# Patient Record
Sex: Female | Born: 1945 | Race: Black or African American | Hispanic: No | Marital: Single | Smoking: Former smoker
Health system: Southern US, Community
[De-identification: ages and names within clinical notes are randomized; demographics above are authoritative.]

## PROBLEM LIST (undated history)

## (undated) DIAGNOSIS — I2510 Atherosclerotic heart disease of native coronary artery without angina pectoris: Secondary | ICD-10-CM

## (undated) DIAGNOSIS — N183 Chronic kidney disease, stage 3 unspecified: Secondary | ICD-10-CM

## (undated) DIAGNOSIS — Z9981 Dependence on supplemental oxygen: Secondary | ICD-10-CM

## (undated) DIAGNOSIS — I739 Peripheral vascular disease, unspecified: Secondary | ICD-10-CM

## (undated) DIAGNOSIS — K219 Gastro-esophageal reflux disease without esophagitis: Secondary | ICD-10-CM

## (undated) DIAGNOSIS — Z9889 Other specified postprocedural states: Secondary | ICD-10-CM

## (undated) DIAGNOSIS — F32A Depression, unspecified: Secondary | ICD-10-CM

## (undated) DIAGNOSIS — M1990 Unspecified osteoarthritis, unspecified site: Secondary | ICD-10-CM

## (undated) DIAGNOSIS — F329 Major depressive disorder, single episode, unspecified: Secondary | ICD-10-CM

## (undated) DIAGNOSIS — K625 Hemorrhage of anus and rectum: Secondary | ICD-10-CM

## (undated) DIAGNOSIS — G8929 Other chronic pain: Secondary | ICD-10-CM

## (undated) DIAGNOSIS — I779 Disorder of arteries and arterioles, unspecified: Secondary | ICD-10-CM

## (undated) DIAGNOSIS — I10 Essential (primary) hypertension: Secondary | ICD-10-CM

## (undated) DIAGNOSIS — I5042 Chronic combined systolic (congestive) and diastolic (congestive) heart failure: Secondary | ICD-10-CM

## (undated) DIAGNOSIS — Z8719 Personal history of other diseases of the digestive system: Secondary | ICD-10-CM

## (undated) DIAGNOSIS — E039 Hypothyroidism, unspecified: Secondary | ICD-10-CM

## (undated) DIAGNOSIS — M545 Low back pain, unspecified: Secondary | ICD-10-CM

## (undated) DIAGNOSIS — E119 Type 2 diabetes mellitus without complications: Secondary | ICD-10-CM

## (undated) DIAGNOSIS — M5136 Other intervertebral disc degeneration, lumbar region: Secondary | ICD-10-CM

## (undated) DIAGNOSIS — I219 Acute myocardial infarction, unspecified: Secondary | ICD-10-CM

## (undated) DIAGNOSIS — J449 Chronic obstructive pulmonary disease, unspecified: Secondary | ICD-10-CM

## (undated) DIAGNOSIS — D649 Anemia, unspecified: Secondary | ICD-10-CM

## (undated) DIAGNOSIS — Z9289 Personal history of other medical treatment: Secondary | ICD-10-CM

## (undated) DIAGNOSIS — I639 Cerebral infarction, unspecified: Secondary | ICD-10-CM

## (undated) DIAGNOSIS — E785 Hyperlipidemia, unspecified: Secondary | ICD-10-CM

## (undated) DIAGNOSIS — M51369 Other intervertebral disc degeneration, lumbar region without mention of lumbar back pain or lower extremity pain: Secondary | ICD-10-CM

## (undated) HISTORY — DX: Gastro-esophageal reflux disease without esophagitis: K21.9

## (undated) HISTORY — PX: TUBAL LIGATION: SHX77

## (undated) HISTORY — PX: CORONARY ARTERY BYPASS GRAFT: SHX141

## (undated) HISTORY — DX: Other intervertebral disc degeneration, lumbar region without mention of lumbar back pain or lower extremity pain: M51.369

## (undated) HISTORY — DX: Essential (primary) hypertension: I10

## (undated) HISTORY — DX: Anemia, unspecified: D64.9

## (undated) HISTORY — DX: Chronic kidney disease, stage 3 unspecified: N18.30

## (undated) HISTORY — PX: PR VEIN BYPASS GRAFT,AORTO-FEM-POP: 35551

## (undated) HISTORY — PX: CAROTID ENDARTERECTOMY: SUR193

## (undated) HISTORY — PX: DILATION AND CURETTAGE OF UTERUS: SHX78

## (undated) HISTORY — DX: Other specified postprocedural states: Z98.890

## (undated) HISTORY — PX: CARPAL TUNNEL RELEASE: SHX101

## (undated) HISTORY — PX: THYROIDECTOMY: SHX17

## (undated) HISTORY — DX: Disorder of arteries and arterioles, unspecified: I77.9

## (undated) HISTORY — DX: Chronic obstructive pulmonary disease, unspecified: J44.9

## (undated) HISTORY — PX: PTCA: SHX146

## (undated) HISTORY — DX: Other intervertebral disc degeneration, lumbar region: M51.36

## (undated) HISTORY — DX: Chronic kidney disease, stage 3 (moderate): N18.3

## (undated) HISTORY — PX: CATARACT EXTRACTION W/ INTRAOCULAR LENS IMPLANT & ANTERIOR VITRECTOMY, BILATERAL: SHX1310

## (undated) HISTORY — DX: Hyperlipidemia, unspecified: E78.5

## (undated) HISTORY — DX: Personal history of other diseases of the digestive system: Z87.19

## (undated) HISTORY — PX: ANGIOPLASTY: SHX39

## (undated) HISTORY — DX: Peripheral vascular disease, unspecified: I73.9

## (undated) HISTORY — PX: CHOLECYSTECTOMY OPEN: SUR202

## (undated) HISTORY — DX: Chronic combined systolic (congestive) and diastolic (congestive) heart failure: I50.42

---

## 1995-10-25 DIAGNOSIS — I219 Acute myocardial infarction, unspecified: Secondary | ICD-10-CM

## 1995-10-25 HISTORY — DX: Acute myocardial infarction, unspecified: I21.9

## 2011-01-24 DIAGNOSIS — Z8673 Personal history of transient ischemic attack (TIA), and cerebral infarction without residual deficits: Secondary | ICD-10-CM | POA: Insufficient documentation

## 2011-11-25 ENCOUNTER — Inpatient Hospital Stay (HOSPITAL_COMMUNITY)
Admission: EM | Admit: 2011-11-25 | Discharge: 2011-11-28 | DRG: 065 | Disposition: A | Discharge status: Home / Self Care | Payer: Medicare Other | Attending: Internal Medicine | Admitting: Internal Medicine

## 2011-11-25 ENCOUNTER — Encounter (HOSPITAL_COMMUNITY): Payer: Self-pay | Admitting: Emergency Medicine

## 2011-11-25 DIAGNOSIS — I5022 Chronic systolic (congestive) heart failure: Secondary | ICD-10-CM | POA: Diagnosis present

## 2011-11-25 DIAGNOSIS — E785 Hyperlipidemia, unspecified: Secondary | ICD-10-CM

## 2011-11-25 DIAGNOSIS — E039 Hypothyroidism, unspecified: Secondary | ICD-10-CM | POA: Diagnosis present

## 2011-11-25 DIAGNOSIS — Z7902 Long term (current) use of antithrombotics/antiplatelets: Secondary | ICD-10-CM

## 2011-11-25 DIAGNOSIS — Z794 Long term (current) use of insulin: Secondary | ICD-10-CM

## 2011-11-25 DIAGNOSIS — I2510 Atherosclerotic heart disease of native coronary artery without angina pectoris: Secondary | ICD-10-CM

## 2011-11-25 DIAGNOSIS — E119 Type 2 diabetes mellitus without complications: Secondary | ICD-10-CM | POA: Diagnosis present

## 2011-11-25 DIAGNOSIS — Z23 Encounter for immunization: Secondary | ICD-10-CM

## 2011-11-25 DIAGNOSIS — I10 Essential (primary) hypertension: Secondary | ICD-10-CM

## 2011-11-25 DIAGNOSIS — Z6827 Body mass index (BMI) 27.0-27.9, adult: Secondary | ICD-10-CM

## 2011-11-25 DIAGNOSIS — I252 Old myocardial infarction: Secondary | ICD-10-CM

## 2011-11-25 DIAGNOSIS — J449 Chronic obstructive pulmonary disease, unspecified: Secondary | ICD-10-CM

## 2011-11-25 DIAGNOSIS — I509 Heart failure, unspecified: Secondary | ICD-10-CM

## 2011-11-25 DIAGNOSIS — Z9861 Coronary angioplasty status: Secondary | ICD-10-CM

## 2011-11-25 DIAGNOSIS — I6350 Cerebral infarction due to unspecified occlusion or stenosis of unspecified cerebral artery: Principal | ICD-10-CM | POA: Diagnosis present

## 2011-11-25 DIAGNOSIS — Z91199 Patient's noncompliance with other medical treatment and regimen due to unspecified reason: Secondary | ICD-10-CM

## 2011-11-25 DIAGNOSIS — Z8673 Personal history of transient ischemic attack (TIA), and cerebral infarction without residual deficits: Secondary | ICD-10-CM | POA: Diagnosis present

## 2011-11-25 DIAGNOSIS — I639 Cerebral infarction, unspecified: Secondary | ICD-10-CM

## 2011-11-25 DIAGNOSIS — I5042 Chronic combined systolic (congestive) and diastolic (congestive) heart failure: Secondary | ICD-10-CM | POA: Diagnosis present

## 2011-11-25 DIAGNOSIS — E890 Postprocedural hypothyroidism: Secondary | ICD-10-CM | POA: Diagnosis present

## 2011-11-25 DIAGNOSIS — Z951 Presence of aortocoronary bypass graft: Secondary | ICD-10-CM

## 2011-11-25 DIAGNOSIS — R279 Unspecified lack of coordination: Secondary | ICD-10-CM | POA: Diagnosis present

## 2011-11-25 DIAGNOSIS — I658 Occlusion and stenosis of other precerebral arteries: Secondary | ICD-10-CM | POA: Diagnosis present

## 2011-11-25 DIAGNOSIS — I739 Peripheral vascular disease, unspecified: Secondary | ICD-10-CM

## 2011-11-25 DIAGNOSIS — Z87891 Personal history of nicotine dependence: Secondary | ICD-10-CM

## 2011-11-25 DIAGNOSIS — I6529 Occlusion and stenosis of unspecified carotid artery: Secondary | ICD-10-CM | POA: Diagnosis present

## 2011-11-25 DIAGNOSIS — Z7982 Long term (current) use of aspirin: Secondary | ICD-10-CM

## 2011-11-25 DIAGNOSIS — R630 Anorexia: Secondary | ICD-10-CM | POA: Diagnosis present

## 2011-11-25 DIAGNOSIS — IMO0002 Reserved for concepts with insufficient information to code with codable children: Secondary | ICD-10-CM | POA: Diagnosis present

## 2011-11-25 DIAGNOSIS — I219 Acute myocardial infarction, unspecified: Secondary | ICD-10-CM

## 2011-11-25 DIAGNOSIS — Z9119 Patient's noncompliance with other medical treatment and regimen: Secondary | ICD-10-CM

## 2011-11-25 DIAGNOSIS — Z888 Allergy status to other drugs, medicaments and biological substances status: Secondary | ICD-10-CM

## 2011-11-25 DIAGNOSIS — E1165 Type 2 diabetes mellitus with hyperglycemia: Secondary | ICD-10-CM | POA: Diagnosis present

## 2011-11-25 HISTORY — DX: Acute myocardial infarction, unspecified: I21.9

## 2011-11-25 HISTORY — DX: Peripheral vascular disease, unspecified: I73.9

## 2011-11-25 HISTORY — DX: Atherosclerotic heart disease of native coronary artery without angina pectoris: I25.10

## 2011-11-25 LAB — CBC
Hemoglobin: 11.7 g/dL — ABNORMAL LOW (ref 12.0–15.0)
MCV: 89.4 fL (ref 78.0–100.0)
Platelets: 194 10*3/uL (ref 150–400)
RBC: 3.85 MIL/uL — ABNORMAL LOW (ref 3.87–5.11)
WBC: 6.5 10*3/uL (ref 4.0–10.5)

## 2011-11-25 LAB — DIFFERENTIAL
Eosinophils Relative: 1 % (ref 0–5)
Lymphocytes Relative: 17 % (ref 12–46)
Lymphs Abs: 1.1 10*3/uL (ref 0.7–4.0)

## 2011-11-25 LAB — BASIC METABOLIC PANEL
CO2: 30 mEq/L (ref 19–32)
Glucose, Bld: 507 mg/dL — ABNORMAL HIGH (ref 70–99)
Potassium: 4.4 mEq/L (ref 3.5–5.1)
Sodium: 132 mEq/L — ABNORMAL LOW (ref 135–145)

## 2011-11-25 LAB — URINALYSIS, ROUTINE W REFLEX MICROSCOPIC
Glucose, UA: >1000 mg/dL — AB
Hgb urine dipstick: NEGATIVE
Specific Gravity, Urine: <1.005 — ABNORMAL LOW (ref 1.005–1.030)

## 2011-11-25 LAB — URINE MICROSCOPIC-ADD ON

## 2011-11-25 MED ORDER — SODIUM CHLORIDE 0.9 % IV BOLUS (SEPSIS)
1000.0000 mL | Freq: Once | INTRAVENOUS | Status: AC
  Administered 2011-11-26: 1000 mL via INTRAVENOUS

## 2011-11-25 NOTE — ED Notes (Signed)
PT. WAITING FOR MRI -BRAIN.

## 2011-11-25 NOTE — ED Provider Notes (Signed)
History     CSN: [id number]  Arrival date & time 11/25/11  1943   First MD Initiated Contact with Patient 11/25/11 2134      Chief Complaint  Patient presents with  . Dizziness    (Consider location/radiation/quality/duration/timing/severity/associated sxs/prior treatment) HPI History provided by pt.   Pt has had room-spinning dizziness upon standing and with walking for the past 3 days.  Does not experience dizziness when she is at rest or when she rotates head.  Associated w/ ataxia, loss of depth perception and visual field changes that she describes as seeing a spinning fan in her left lateral visual field.  This also occurs only with walking.  Denies blurred vision, dysarthria, dysphagia, confusion, extremity weakness/paresthesias and nausea/vomiting.  Has not had a head injury.  Has h/o TIA.  Is anti-coagulated w/ plavix.    Past Medical History  Diagnosis Date  . Diabetes mellitus   . MI (myocardial infarction)   . CAD (coronary artery disease)   . Neuromyopathy     Past Surgical History  Procedure Date  . Cardiac surgery   . Thyroid surgery     No family history on file.  History  Substance Use Topics  . Smoking status: Former Games developer  . Smokeless tobacco: Not on file  . Alcohol Use: No    OB History    Grav Para Term Preterm Abortions TAB SAB Ect Mult Living                  Review of Systems  All other systems reviewed and are negative.    Allergies  Lisinopril and Vicodin  Home Medications   Current Outpatient Rx  Name Route Sig Dispense Refill  . ALBUTEROL SULFATE HFA 108 (90 BASE) MCG/ACT IN AERS Inhalation Inhale 2 puffs into the lungs every 6 (six) hours as needed. For shortness of breath    . ALBUTEROL SULFATE (5 MG/ML) 0.5% IN NEBU Nebulization Take 2.5 mg by nebulization every 6 (six) hours as needed. For shortness of breath    . CARVEDILOL 25 MG PO TABS Oral Take 25 mg by mouth 2 (two) times daily with a meal.    . CLOPIDOGREL BISULFATE  75 MG PO TABS Oral Take 75 mg by mouth daily.    . FUROSEMIDE 20 MG PO TABS Oral Take 20 mg by mouth 2 (two) times daily.    . ISOSORBIDE MONONITRATE ER 60 MG PO TB24 Oral Take 30 mg by mouth daily.    . TRAMADOL HCL 50 MG PO TABS Oral Take 50 mg by mouth every 6 (six) hours as needed. For pain      BP 158/71  Pulse 80  Temp(Src) 98.1 F (36.7 C) (Oral)  Resp 18  SpO2 100%  Physical Exam  Nursing note and vitals reviewed. Constitutional: She is oriented to person, place, and time. She appears well-developed and well-nourished. No distress.  HENT:  Head: Normocephalic and atraumatic.  Eyes:       Normal appearance  Neck: Normal range of motion.  Cardiovascular: Normal rate, regular rhythm and intact distal pulses.   Pulmonary/Chest: Effort normal and breath sounds normal.  Musculoskeletal: Normal range of motion.  Neurological: She is alert and oriented to person, place, and time. She has normal reflexes. She displays no tremor. No cranial nerve deficit or sensory deficit. Coordination and gait normal.       5/5 and equal upper and lower extremity strength.  No past pointing.  No pronator drift.  No  nystagmus. Positive romberg sign.  Normal gait but pt drifts to the right as she walks.  Pt reaches for my hand but falls short.    Skin: Skin is warm and dry. No rash noted.  Psychiatric: She has a normal mood and affect. Her behavior is normal.    ED Course  Procedures (including critical care time)  Labs Reviewed  CBC - Abnormal; Notable for the following:    RBC 3.85 (*)    Hemoglobin 11.7 (*)    HCT 34.4 (*)    All other components within normal limits  BASIC METABOLIC PANEL - Abnormal; Notable for the following:    Sodium 132 (*)    Chloride 95 (*)    Glucose, Bld 507 (*)    GFR calc non Af Amer 57 (*)    GFR calc Af Amer 66 (*)    All other components within normal limits  URINALYSIS, ROUTINE W REFLEX MICROSCOPIC - Abnormal; Notable for the following:    Specific  Gravity, Urine <1.005 (*)    Glucose, UA >1000 (*)    All other components within normal limits  GLUCOSE, CAPILLARY - Abnormal; Notable for the following:    Glucose-Capillary 291 (*)    All other components within normal limits  DIFFERENTIAL  URINE MICROSCOPIC-ADD ON   No results found.   No diagnosis found.    MDM  Pt w/ h/o TIA, anti-coagulated w/ plavix, presents w/ c/o dizziness x 3d + changes in peripheral vision and depth perception.  Exam sig for pos romberg sign and ataxia.  Labs sig for hyperglycemia.  Sx may be secondary to hyperglycemia but MRI pending to r/o posterior circulation stroke.  Pt to be moved to CDU to have MRI in am.  She is on 2nd L bolus NS and has received 15units subq novolog.  BG currently 291.  Will recheck after she eats.  She should be discharged home w/ refill of lantus (30units qhs).          [name], [place] 11/26/11 [phone]

## 2011-11-25 NOTE — ED Notes (Signed)
Pt c/o feeling dizzy when she turns her head to fast or gets up to walk after sitting.  Onset approx 3 days ago.  Pt also c/o right ear pain

## 2011-11-26 ENCOUNTER — Observation Stay (HOSPITAL_COMMUNITY): Payer: Medicare Other

## 2011-11-26 ENCOUNTER — Encounter (HOSPITAL_COMMUNITY): Payer: Self-pay | Admitting: Internal Medicine

## 2011-11-26 ENCOUNTER — Other Ambulatory Visit: Payer: Self-pay

## 2011-11-26 DIAGNOSIS — G629 Polyneuropathy, unspecified: Secondary | ICD-10-CM | POA: Insufficient documentation

## 2011-11-26 DIAGNOSIS — Z8673 Personal history of transient ischemic attack (TIA), and cerebral infarction without residual deficits: Secondary | ICD-10-CM | POA: Diagnosis present

## 2011-11-26 DIAGNOSIS — I10 Essential (primary) hypertension: Secondary | ICD-10-CM | POA: Diagnosis present

## 2011-11-26 DIAGNOSIS — I2510 Atherosclerotic heart disease of native coronary artery without angina pectoris: Secondary | ICD-10-CM | POA: Insufficient documentation

## 2011-11-26 DIAGNOSIS — I5042 Chronic combined systolic (congestive) and diastolic (congestive) heart failure: Secondary | ICD-10-CM | POA: Diagnosis present

## 2011-11-26 DIAGNOSIS — E785 Hyperlipidemia, unspecified: Secondary | ICD-10-CM | POA: Diagnosis present

## 2011-11-26 DIAGNOSIS — I252 Old myocardial infarction: Secondary | ICD-10-CM | POA: Insufficient documentation

## 2011-11-26 DIAGNOSIS — E1165 Type 2 diabetes mellitus with hyperglycemia: Secondary | ICD-10-CM | POA: Diagnosis present

## 2011-11-26 DIAGNOSIS — I739 Peripheral vascular disease, unspecified: Secondary | ICD-10-CM | POA: Insufficient documentation

## 2011-11-26 DIAGNOSIS — E039 Hypothyroidism, unspecified: Secondary | ICD-10-CM | POA: Diagnosis present

## 2011-11-26 DIAGNOSIS — J449 Chronic obstructive pulmonary disease, unspecified: Secondary | ICD-10-CM | POA: Diagnosis present

## 2011-11-26 DIAGNOSIS — Z951 Presence of aortocoronary bypass graft: Secondary | ICD-10-CM | POA: Insufficient documentation

## 2011-11-26 LAB — GLUCOSE, CAPILLARY
Glucose-Capillary: 291 mg/dL — ABNORMAL HIGH (ref 70–99)
Glucose-Capillary: 237 mg/dL — ABNORMAL HIGH (ref 70–99)
Glucose-Capillary: 479 mg/dL — ABNORMAL HIGH (ref 70–99)
Glucose-Capillary: 336 mg/dL — ABNORMAL HIGH (ref 70–99)

## 2011-11-26 LAB — TSH: TSH: 1.869 u[IU]/mL (ref 0.350–4.500)

## 2011-11-26 LAB — RAPID URINE DRUG SCREEN, HOSP PERFORMED
Amphetamines: NOT DETECTED
Barbiturates: NOT DETECTED
Benzodiazepines: NOT DETECTED
Tetrahydrocannabinol: NOT DETECTED

## 2011-11-26 MED ORDER — ALBUTEROL SULFATE (5 MG/ML) 0.5% IN NEBU: 2.5000 mg | INHALATION_SOLUTION | RESPIRATORY_TRACT | Status: DC

## 2011-11-26 MED ORDER — IPRATROPIUM BROMIDE 0.02 % IN SOLN: 0.5000 mg | RESPIRATORY_TRACT | Status: DC | PRN

## 2011-11-26 MED ORDER — CLOPIDOGREL BISULFATE 75 MG PO TABS
75.0000 mg | ORAL_TABLET | Freq: Every day | ORAL | Status: DC
  Administered 2011-11-26: 75 mg via ORAL
  Filled 2011-11-26 (×2): qty 1

## 2011-11-26 MED ORDER — SODIUM CHLORIDE 0.9 % IV BOLUS (SEPSIS)
1000.0000 mL | Freq: Once | INTRAVENOUS | Status: AC
  Administered 2011-11-26: 1000 mL via INTRAVENOUS

## 2011-11-26 MED ORDER — IPRATROPIUM BROMIDE 0.02 % IN SOLN
0.5000 mg | RESPIRATORY_TRACT | Status: DC
  Administered 2011-11-27 – 2011-11-28 (×8): 0.5 mg via RESPIRATORY_TRACT
  Filled 2011-11-26 (×9): qty 2.5

## 2011-11-26 MED ORDER — ALBUTEROL SULFATE HFA 108 (90 BASE) MCG/ACT IN AERS
2.0000 | INHALATION_SPRAY | RESPIRATORY_TRACT | Status: DC | PRN
  Administered 2011-11-26: 2 via RESPIRATORY_TRACT
  Filled 2011-11-26: qty 6.7

## 2011-11-26 MED ORDER — INSULIN ASPART 100 UNIT/ML ~~LOC~~ SOLN
0.0000 [IU] | Freq: Three times a day (TID) | SUBCUTANEOUS | Status: DC
  Administered 2011-11-26 – 2011-11-27 (×3): 15 [IU] via SUBCUTANEOUS
  Administered 2011-11-28: 3 [IU] via SUBCUTANEOUS
  Administered 2011-11-28: 2 [IU] via SUBCUTANEOUS
  Filled 2011-11-26: qty 3

## 2011-11-26 MED ORDER — ISOSORBIDE MONONITRATE ER 30 MG PO TB24
30.0000 mg | ORAL_TABLET | ORAL | Status: AC
  Administered 2011-11-26: 30 mg via ORAL
  Filled 2011-11-26: qty 1

## 2011-11-26 MED ORDER — GI COCKTAIL ~~LOC~~
30.0000 mL | Freq: Three times a day (TID) | ORAL | Status: DC | PRN
  Filled 2011-11-26: qty 30

## 2011-11-26 MED ORDER — FUROSEMIDE 20 MG PO TABS
20.0000 mg | ORAL_TABLET | Freq: Once | ORAL | Status: AC
  Administered 2011-11-26: 20 mg via ORAL
  Filled 2011-11-26: qty 1

## 2011-11-26 MED ORDER — INSULIN ASPART 100 UNIT/ML ~~LOC~~ SOLN
10.0000 [IU] | Freq: Once | SUBCUTANEOUS | Status: AC
  Administered 2011-11-26: 10 [IU] via SUBCUTANEOUS

## 2011-11-26 MED ORDER — TRAMADOL HCL 50 MG PO TABS
50.0000 mg | ORAL_TABLET | Freq: Four times a day (QID) | ORAL | Status: DC | PRN
  Filled 2011-11-26: qty 1

## 2011-11-26 MED ORDER — ALBUTEROL SULFATE (5 MG/ML) 0.5% IN NEBU
2.5000 mg | INHALATION_SOLUTION | RESPIRATORY_TRACT | Status: DC | PRN
  Administered 2011-11-26: 2.5 mg via RESPIRATORY_TRACT
  Filled 2011-11-26 (×2): qty 0.5

## 2011-11-26 MED ORDER — TRAMADOL HCL 50 MG PO TABS
50.0000 mg | ORAL_TABLET | Freq: Four times a day (QID) | ORAL | Status: DC | PRN
  Administered 2011-11-27 – 2011-11-28 (×3): 50 mg via ORAL
  Filled 2011-11-26 (×2): qty 1

## 2011-11-26 MED ORDER — SODIUM CHLORIDE 0.9 % IJ SOLN
3.0000 mL | INTRAMUSCULAR | Status: DC | PRN
  Administered 2011-11-27: 3 mL via INTRAVENOUS

## 2011-11-26 MED ORDER — GADOBENATE DIMEGLUMINE 529 MG/ML IV SOLN
20.0000 mL | Freq: Once | INTRAVENOUS | Status: AC | PRN
  Administered 2011-11-26: 20 mL via INTRAVENOUS

## 2011-11-26 MED ORDER — LOSARTAN POTASSIUM 50 MG PO TABS
50.0000 mg | ORAL_TABLET | Freq: Every day | ORAL | Status: DC
  Administered 2011-11-26 – 2011-11-28 (×3): 50 mg via ORAL
  Filled 2011-11-26 (×3): qty 1

## 2011-11-26 MED ORDER — IPRATROPIUM BROMIDE 0.02 % IN SOLN
0.5000 mg | RESPIRATORY_TRACT | Status: DC | PRN
  Administered 2011-11-26: 0.5 mg via RESPIRATORY_TRACT
  Filled 2011-11-26 (×2): qty 2.5

## 2011-11-26 MED ORDER — ASPIRIN 81 MG PO CHEW
81.0000 mg | CHEWABLE_TABLET | Freq: Every day | ORAL | Status: DC
  Administered 2011-11-26 – 2011-11-28 (×3): 81 mg via ORAL
  Filled 2011-11-26 (×3): qty 1

## 2011-11-26 MED ORDER — CARVEDILOL 25 MG PO TABS
25.0000 mg | ORAL_TABLET | ORAL | Status: AC
  Administered 2011-11-26: 25 mg via ORAL
  Filled 2011-11-26: qty 1

## 2011-11-26 MED ORDER — ALBUTEROL SULFATE (5 MG/ML) 0.5% IN NEBU
2.5000 mg | INHALATION_SOLUTION | RESPIRATORY_TRACT | Status: DC
  Administered 2011-11-27 – 2011-11-28 (×8): 2.5 mg via RESPIRATORY_TRACT
  Filled 2011-11-26 (×9): qty 0.5

## 2011-11-26 MED ORDER — SIMVASTATIN 40 MG PO TABS
40.0000 mg | ORAL_TABLET | Freq: Every day | ORAL | Status: DC
Start: 2011-11-26 — End: 2011-11-28
  Administered 2011-11-26 – 2011-11-27 (×2): 40 mg via ORAL
  Filled 2011-11-26 (×3): qty 1

## 2011-11-26 MED ORDER — CLOPIDOGREL BISULFATE 75 MG PO TABS
75.0000 mg | ORAL_TABLET | Freq: Every day | ORAL | Status: DC
  Administered 2011-11-27 – 2011-11-28 (×2): 75 mg via ORAL
  Filled 2011-11-26 (×3): qty 1

## 2011-11-26 MED ORDER — PREDNISONE 20 MG PO TABS
40.0000 mg | ORAL_TABLET | ORAL | Status: AC
  Administered 2011-11-26: 40 mg via ORAL
  Filled 2011-11-26: qty 2

## 2011-11-26 MED ORDER — DOXYCYCLINE HYCLATE 100 MG PO TABS
100.0000 mg | ORAL_TABLET | Freq: Two times a day (BID) | ORAL | Status: DC
  Administered 2011-11-26 – 2011-11-28 (×4): 100 mg via ORAL
  Filled 2011-11-26 (×5): qty 1

## 2011-11-26 MED ORDER — SODIUM CHLORIDE 0.9 % IJ SOLN
3.0000 mL | Freq: Two times a day (BID) | INTRAMUSCULAR | Status: DC
  Administered 2011-11-26 – 2011-11-28 (×3): 3 mL via INTRAVENOUS

## 2011-11-26 MED ORDER — CARVEDILOL 25 MG PO TABS
25.0000 mg | ORAL_TABLET | Freq: Two times a day (BID) | ORAL | Status: DC
  Administered 2011-11-26 – 2011-11-28 (×4): 25 mg via ORAL
  Filled 2011-11-26 (×6): qty 1

## 2011-11-26 MED ORDER — INSULIN ASPART 100 UNIT/ML ~~LOC~~ SOLN
15.0000 [IU] | Freq: Once | SUBCUTANEOUS | Status: AC
  Administered 2011-11-26: 15 [IU] via SUBCUTANEOUS
  Filled 2011-11-26: qty 1

## 2011-11-26 MED ORDER — FUROSEMIDE 40 MG PO TABS
40.0000 mg | ORAL_TABLET | Freq: Two times a day (BID) | ORAL | Status: DC
  Administered 2011-11-26 – 2011-11-28 (×4): 40 mg via ORAL
  Filled 2011-11-26 (×6): qty 1

## 2011-11-26 MED ORDER — LORAZEPAM 2 MG/ML IJ SOLN
INTRAMUSCULAR | Status: AC
  Administered 2011-11-26: 1 mg via INTRAVENOUS
  Filled 2011-11-26: qty 1

## 2011-11-26 MED ORDER — FUROSEMIDE 20 MG PO TABS
20.0000 mg | ORAL_TABLET | Freq: Two times a day (BID) | ORAL | Status: DC
  Filled 2011-11-26: qty 1

## 2011-11-26 MED ORDER — IPRATROPIUM BROMIDE 0.02 % IN SOLN: 0.5000 mg | RESPIRATORY_TRACT | Status: DC

## 2011-11-26 MED ORDER — ENOXAPARIN SODIUM 40 MG/0.4ML ~~LOC~~ SOLN
40.0000 mg | SUBCUTANEOUS | Status: DC
  Administered 2011-11-26 – 2011-11-27 (×2): 40 mg via SUBCUTANEOUS
  Filled 2011-11-26 (×3): qty 0.4

## 2011-11-26 MED ORDER — ONDANSETRON HCL 4 MG/2ML IJ SOLN: 4.0000 mg | Freq: Four times a day (QID) | INTRAMUSCULAR | Status: DC | PRN

## 2011-11-26 MED ORDER — CILOSTAZOL 100 MG PO TABS
100.0000 mg | ORAL_TABLET | Freq: Two times a day (BID) | ORAL | Status: DC
  Administered 2011-11-26 – 2011-11-28 (×4): 100 mg via ORAL
  Filled 2011-11-26 (×5): qty 1

## 2011-11-26 MED ORDER — ISOSORBIDE MONONITRATE ER 60 MG PO TB24
60.0000 mg | ORAL_TABLET | Freq: Every day | ORAL | Status: DC
  Administered 2011-11-27 – 2011-11-28 (×2): 60 mg via ORAL
  Filled 2011-11-26 (×2): qty 1

## 2011-11-26 MED ORDER — GABAPENTIN 300 MG PO CAPS
300.0000 mg | ORAL_CAPSULE | Freq: Two times a day (BID) | ORAL | Status: DC
  Administered 2011-11-26 – 2011-11-28 (×5): 300 mg via ORAL
  Filled 2011-11-26 (×6): qty 1

## 2011-11-26 MED ORDER — LORAZEPAM 2 MG/ML IJ SOLN
1.0000 mg | Freq: Once | INTRAMUSCULAR | Status: AC
  Administered 2011-11-26: 1 mg via INTRAVENOUS

## 2011-11-26 MED ORDER — ASPIRIN 81 MG PO TABS: 81.0000 mg | ORAL_TABLET | Freq: Every day | ORAL | Status: DC

## 2011-11-26 MED ORDER — INSULIN GLARGINE 100 UNIT/ML ~~LOC~~ SOLN
30.0000 [IU] | Freq: Every day | SUBCUTANEOUS | Status: DC
  Administered 2011-11-26: 30 [IU] via SUBCUTANEOUS
  Filled 2011-11-26: qty 3

## 2011-11-26 NOTE — ED Provider Notes (Signed)
Pt held overnight for MRI imaging.  Scan today shows subacute infarct.  Pt will be admitted to the hospital for further evaluation.  Pt remains stable.  Not a TPA candidate as her symptoms had been ongoing for several days.  Celene Kras, MD 11/26/11 [phone]

## 2011-11-26 NOTE — ED Notes (Signed)
CBG was 257. Dr. Karma Ganja was made aware of CBG.

## 2011-11-26 NOTE — ED Provider Notes (Signed)
Patient denies any dizziness.  She has some mild SOB.  Lung exam shows wheezes in bilateral bases.  Will order CXR for eval and given albuterol inhaler.  Given Lasix home med dose due to mildly elevated BP.  It appears patient has not been taking her meds appropriately at home.  Plan to refer to Dr. Cliffton Asters with [facility] at d/c.  Daughter and pt in compliance. Awaiting results of MRI and CXR.  Lindley Magnus [place], [place] 11/26/11 0908  Madelaine Bhat, [place] 11/26/11 1141

## 2011-11-26 NOTE — ED Notes (Signed)
Spoke with bed control at 1220 - aware of bed request.

## 2011-11-26 NOTE — Progress Notes (Signed)
Called to bedside to evaluate patient's SOB.  Also called for CBG=336.    Pt tells [place] that SOB is a bit worse today, but is on 3L [place] at night as well as neb treatments at home.  She is not on daily spiriva.  Her last neb today was at 2:20pm.  She denies feeling anxious.   She was saturating 98% on 3L [place].  Lung exam significant for b/l expiratory wheezing.  No significant b/l LE pitting edema.    Scheduled duonebs q4h tonight (can be changed to prn tomorrow if symptoms improve), prednisone 40mg  PO  X 1 dose, started doxycycline 100mg  PO BID (consider 3-5d treatment). Also gave 10 units novolog for elevated CBG.  Pt due for 30u lantus qHS.

## 2011-11-26 NOTE — ED Provider Notes (Signed)
Medical screening examination/treatment/procedure(s) were performed by non-physician practitioner and as supervising physician I was immediately available for consultation/collaboration.   Danilo Cappiello A. Kirklin Mcduffee, MD 11/26/11 1455 

## 2011-11-26 NOTE — ED Provider Notes (Signed)
Medical screening examination/treatment/procedure(s) were performed by non-physician practitioner and as supervising physician I was immediately available for consultation/collaboration.   Celene Kras, MD 11/26/11 [phone]

## 2011-11-26 NOTE — ED Notes (Signed)
Received report assumed patient care Patient placed in cdu 2   From pod 12

## 2011-11-26 NOTE — ED Notes (Signed)
Patient transported to MRI 

## 2011-11-26 NOTE — H&P (Signed)
Hospital Admission Note  Date: 11/26/2011  Patient name: [name]  Medical record number: [id number]  Date of birth: [birth year]  Age: 66 y.o. Gender: female  PCP: No primary provider on file.   Medical Service:  Internal medicine teaching program, B2   Attending physician: Dr. Doneen Poisson   1st Contact: Dr. Blanca Friend Pager: [phone]   2nd Contact: Dr. Bard Herbert Pager: [phone]   After 5 pm or weekends:  1st Contact: Pager: [phone]  2nd Contact: Pager: [phone]   Chief Complaint: Dizziness, visual changes, problem walking   History of Present Illness:  Ms. Twining is a 66 yo AAF with history of stroke, HF, DM, HTN, and hypothyroidism brought to the ED by her daughter for dizziness, vision changes, and problem walking. She has recently moved to the area from [place] to live with her daughter. In the past three days, she had complained of dizziness, right sided head headache, and seeing a spinning fan in her left visual field when she stands up. Patient later described it as a flashing light that persists with standing. Patient and her daughter denied slurred speech, blurry vision, worsening of her baseline L eyelid ptosis, and dysphagia. However, patient's daughter had noticed that patient tends to deviate to the left side when she ambulates. Patient has poor circulation to her left foot for the past 6 months and had ambulated only minimally around the house. She has also had chest congestion and wheezing in the past two weeks. She denied rhinitis and sore throat. She uses albuterol nebulizer and inhaler chronically for COPD. Patient has heart failure for eighteen years, status post angioplasty, double bypass, and stents. Her last cath was done three months ago. She also had a heart attack more than ten years ago. She also has loose bowel movements with food intake. She denied abdominal pain, constipation, melena, and hematochezia.   She has history of TIA vs. Stroke in April 2012.   She had residual speech slurring requiring speech therapy for a month or two that resolved with therapy.  No other residual deficits.     Meds:   Medications Prior to Admission   Medication  Sig  Dispense  Refill   .  aspirin 81 MG tablet  Take 81 mg by mouth daily.     .  celecoxib (CELEBREX) 200 MG capsule  Take 200 mg by mouth 2 (two) times daily.     .  cilostazol (PLETAL) 100 MG tablet  Take 100 mg by mouth 2 (two) times daily.           .  furosemide (LASIX) 40 MG tablet  Take 40 mg by mouth 2 (two) times daily.     Marland Kitchen  gabapentin (NEURONTIN) 300 MG capsule  Take 300 mg by mouth 2 (two) times daily.     .  insulin glargine (LANTUS) 100 UNIT/ML injection  Inject 30 Units into the skin at bedtime.     Marland Kitchen  losartan (COZAAR) 50 MG tablet  Take 50 mg by mouth daily.     .  nitroGLYCERIN (NITROSTAT) 0.4 MG SL tablet  Place 0.4 mg under the tongue every 5 (five) minutes as needed.     .  potassium chloride SA (K-DUR,KLOR-CON) 20 MEQ tablet  Take 20 mEq by mouth daily.     .  simvastatin (ZOCOR) 40 MG tablet  Take 40 mg by mouth at bedtime.      Allergies:  Lisinopril and Vicodin  Past Medical History  Diagnosis  Date   .  Diabetes mellitus      on Lantus 30 U   .  MI (myocardial infarction)    .  CAD (coronary artery disease)    .  Heart failure      [place], [place] previous records.   .  S/P CABG (coronary artery bypass graft)    .  PAD (peripheral artery disease)     Past Surgical History   Procedure  Date   .  Ptca    .  Thyroidectomy    .  Coronary artery bypass graft      2 valves   .  Carotid endarterectomy     History reviewed. No pertinent family history.  History    Social History   .  Marital Status:  Single     Spouse Name:  N/A     Number of Children:  N/A   .  Years of Education:  N/A    Occupational History   .  Not on file.    Social History Main Topics   .  Smoking status:  Former Smoker -- 1.0 packs/day for 50 years     Types:  Cigarettes     Quit  date:  11/25/2010   .  Smokeless tobacco:  Never Used   .  Alcohol Use:  No   .  Drug Use:  No   .  Sexually Active:  No    Other Topics  Concern   .  Not on file    Social History Narrative    Retired Advertising account executive. Worked at the [employer] on [employer]. Formerly lived in [place] area. 2 daughters, Living here with her daughter in [place] starting end of January 2013.     Review of Systems: ROS: General: no fevers, chills, changes in appetite Skin: no rash HEENT: See HPI for vision changes, no sore throat Pulm: Wheezing present for a couple weeks. No dyspnea, coughing CV: no chest pain, palpitations, shortness of breath Abd: no abdominal pain, nausea/vomiting, diarrhea/constipation GU: no dysuria, hematuria, polyuria Ext: no arthralgias, myalgias Neuro: no weakness, numbness, or tingling   Physical Exam: Blood pressure 161/117, pulse 87, temperature 98.2 F (36.8 C), temperature source Oral, resp. rate 20, SpO2 100.00%.  General: alert, well-developed, and cooperative to examination.   Head: normocephalic and atraumatic.   Eyes: see flashing spinning lights in L visual field intermittently with either eye.  Vision grossly intact otherwise, pupils equal, pupils round, pupils reactive to light, no injection and anicteric. Bilateral proptosis.  L eyelid ptosis present.    Mouth: pharynx pink and moist, no erythema, and no exudates.   Neck: Bilateral carotid bruits and surgical scar over L carotid. Supple, full ROM, no JVD.  Lungs: Decreased air movement bilaterally with diffuse mild wheezing.  No crackles heard on exam.  Heart: normal rate, regular rhythm, no murmur, no gallop, and no rub.   Pulses: diminished but present 1+ DP/PT pulses bilaterally  Extremities: 2+ pitting edema halfway up calf bilaterally. No cyanosis, clubbing.  L foot has more pigmentation than right.  Neurologic: alert & oriented X3, cranial nerves II-XII intact, strength normal in  all extremities, sensation intact to light touch, and gait normal.   Skin: turgor normal and no rashes.      Lab results: Basic Metabolic Panel:  Basename 11/25/11 2232  NA 132*  K 4.4  CL 95*  CO2 30  GLUCOSE 507*  BUN 15  CREATININE 1.01  CALCIUM  9.5  MG --  PHOS --   CBC:  Basename 11/25/11 2232  WBC 6.5  NEUTROABS 4.8  HGB 11.7*  HCT 34.4*  MCV 89.4  PLT 194   CBG:  Basename 11/26/11 0838 11/26/11 0650 11/26/11 0259 11/26/11 0103  GLUCAP 237* 257* 182* 291*   Urinalysis:  Basename 11/25/11 2245  COLORURINE YELLOW  LABSPEC <1.005*  PHURINE 5.5  GLUCOSEU >1000*  HGBUR NEGATIVE  BILIRUBINUR NEGATIVE  KETONESUR NEGATIVE  PROTEINUR NEGATIVE  UROBILINOGEN 0.2  NITRITE NEGATIVE  LEUKOCYTESUR NEGATIVE     Imaging results:  Mr Laqueta Jean AV Contrast  11/26/2011  *RADIOLOGY REPORT*  Clinical Data: Dizziness.  MRI HEAD WITHOUT AND WITH CONTRAST  Technique:  Multiplanar, multiecho pulse sequences of the brain and surrounding structures were obtained according to standard protocol without and with intravenous contrast  Contrast: 20mL MULTIHANCE GADOBENATE DIMEGLUMINE 529 MG/ML IV SOLN  Comparison: None.  Findings: A focal area of restricted diffusion is present in the posterior right parietal lobe.  T2 and FLAIR hyperintensity is evident.  There is also focal cortical enhancement along the area of restricted diffusion.  There is no definite hemorrhage.  No mass lesion is evident.  Multiple patchy subcortical T2 and FLAIR hyperintensities are present bilaterally.  Confluent periventricular white matter changes evident.  Flow is present in the major intracranial arteries.  The patient is status post left lens extraction.  The globes and orbits are otherwise intact.  IMPRESSION:  1.  Left parietal area of restricted diffusion, T2 hyperintensity, and enhancement.  This likely represents a subacute infarct. Enhancement can be seen as early as 3 days. The differential diagnosis  includes also a venous infarct.  Neoplasm or demyelinating process is less likely.  This appears to involve the cortex.  Short-term follow-up examination at 1-2 months would be useful to follow this lesion. 2.  Extensive white matter changes elsewhere likely reflect the sequelae of chronic microvascular ischemia.  Original Report Authenticated By: Jamesetta Orleans. MATTERN, M.D.   Dg Chest Port 1 View  11/26/2011  *RADIOLOGY REPORT*  Clinical Data: Shortness of breath, wheezing at the lung bases  PORTABLE CHEST - 1 VIEW  Comparison: None.  Findings: Moderate enlargement of the cardiomediastinal silhouette is noted with evidence of prior CABG.  Central vascular congestion is noted.  A few Kerley B lines are noted at the right and left bases.  No evidence for alveolar consolidation.  Trace if any pleural fluid is present.  No pneumothorax.  Clips project over the neck.  IMPRESSION: Moderate cardiomegaly with probable trace interstitial edema. Follow-up after presumed diuresis is recommended with PA and lateral chest radiographs.  Original Report Authenticated By: Harrel Lemon, M.D.    Assessment & Plan by Problem:  Stroke: Three days of room-spinning dizzyness at all times when standing.  No dizzyness laying or sitting or with turning head while laying or sitting.  Intermittently sees spinning flashing light in left visual field, present in both eyes.  Neuro exam normal with exception of visual symptoms and L eye ptosis that patient thinks she has had for a long time.  MRI finding or likely subacute infarct in left parietal area does not correlate with visual field finding, but lesion seen in central right occipital lobe on my team's MRI read matches her visual field symptoms.  Passed swallow screen in ED.    -Admit for stroke work-up -Carotid dopplers, MRA brain -Continue plavix and aspirin -2D echocardiogram -Continue simvastatin for now, check lipid panel -HbA1c -PT/OT -  12 lead EKG -Will continue  home BP meds Coreg, Imdur, and Losartan given time since onset of symptoms does not warrant permissive HTN.   -Advance diet as tolerated to carb modified medium  COPD: Wheezing on exam with poor air flow.  Denies SOB worse than baseline, so this is likely poorly controlled COPD as she does not use long acting inhalers.  No definite exacerbation at this time. -PRN nebulizers -Consider starting long acting inhaler such as Spiriva on discharge  CHF: 2+ lower extremity pitting edema bilaterally and chest xray showing trace interstitial edema.  Suspect systolic CHF given her history of CAD and MI.  Lungs do not have crackles on exam but these may be masked by poor airflow.  -Will continue her home dose of Lasix 40mg  PO BID for now.  Consider titrating this up.   -Continue Carvedilol and losartan -Will attempt to get her past medical records for her actual diagnosis and most recent catheterization and echo records  DM: High sugars on presentation likely due to her not taking her insulin for a while (per patient report).  Will restart her home Lantus 30 units QHS.  SSI moderate and close monitoring of her CBGs.    History of Thyroid Disease: Diagnosis uncertain.  Mild proptosis present on eye exam.  Patient cannot recall if she had hyperthyroid or hypothyroid, but she did have a procedure of some kind.    -Will attempt to get her past records -TSH and free T4  DVT Prophylaxis: SubQ heparin  Full Code   SignedYaakov Guthrie, BRAD 11/26/2011, 11:59 AM

## 2011-11-26 NOTE — ED Notes (Signed)
Attempted to call report to [phone].  Advised by Diplomatic Services operational officer, Tammy, RN will need to return call.

## 2011-11-26 NOTE — Progress Notes (Addendum)
*  PRELIMINARY RESULTS* Vascular Ultrasound Carotid Duplex (Doppler) has been completed.  Preliminary findings: Right=  40-59% internal carotid artery stenosis, lowest end of scale.  Left= 40-59% internal carotid artery stenosis, highest end of scale.   Bilateral antegrade vertebral flow.  Farrel Demark RDMS 11/26/2011, 3:56 PM

## 2011-11-26 NOTE — ED Notes (Signed)
Pt's daughter has returned.  She is able to voice the plan of care with regard to MRI results and admission.  Also aware of pt's NPO status until swallow screen may be performed.

## 2011-11-26 NOTE — ED Notes (Signed)
Pts CBG 291

## 2011-11-26 NOTE — ED Notes (Signed)
Radiology Tech here to do portable xray of chest. Pt placed in pt gown.

## 2011-11-27 ENCOUNTER — Inpatient Hospital Stay (HOSPITAL_COMMUNITY): Payer: Medicare Other

## 2011-11-27 LAB — LIPID PANEL
HDL: 37 mg/dL — ABNORMAL LOW (ref >39)
LDL Cholesterol: 171 mg/dL — ABNORMAL HIGH (ref 0–99)
Total CHOL/HDL Ratio: 6.7 RATIO

## 2011-11-27 LAB — GLUCOSE, CAPILLARY
Glucose-Capillary: 117 mg/dL — ABNORMAL HIGH (ref 70–99)
Glucose-Capillary: 448 mg/dL — ABNORMAL HIGH (ref 70–99)
Glucose-Capillary: 506 mg/dL — ABNORMAL HIGH (ref 70–99)
Glucose-Capillary: 491 mg/dL — ABNORMAL HIGH (ref 70–99)
Glucose-Capillary: 526 mg/dL — ABNORMAL HIGH (ref 70–99)

## 2011-11-27 MED ORDER — INSULIN ASPART 100 UNIT/ML ~~LOC~~ SOLN
5.0000 [IU] | Freq: Once | SUBCUTANEOUS | Status: AC
  Administered 2011-11-27: 5 [IU] via SUBCUTANEOUS

## 2011-11-27 MED ORDER — LORAZEPAM BOLUS VIA INFUSION: 0.5000 mg | INTRAVENOUS | Status: DC | PRN

## 2011-11-27 MED ORDER — LORAZEPAM 2 MG/ML IJ SOLN: 0.5000 mg | INTRAMUSCULAR | Status: DC | PRN

## 2011-11-27 MED ORDER — INSULIN ASPART 100 UNIT/ML ~~LOC~~ SOLN
20.0000 [IU] | Freq: Once | SUBCUTANEOUS | Status: AC
  Administered 2011-11-27: 20 [IU] via SUBCUTANEOUS

## 2011-11-27 MED ORDER — INSULIN GLARGINE 100 UNIT/ML ~~LOC~~ SOLN
40.0000 [IU] | Freq: Every day | SUBCUTANEOUS | Status: DC
  Administered 2011-11-27: 40 [IU] via SUBCUTANEOUS

## 2011-11-27 MED ORDER — HYDRALAZINE HCL 20 MG/ML IJ SOLN
10.0000 mg | Freq: Once | INTRAMUSCULAR | Status: AC
  Administered 2011-11-27: 10 mg via INTRAVENOUS
  Filled 2011-11-27: qty 0.5

## 2011-11-27 MED ORDER — LORAZEPAM 2 MG/ML IJ SOLN
INTRAMUSCULAR | Status: AC
  Administered 2011-11-27: 0.5 mg
  Filled 2011-11-27: qty 1

## 2011-11-27 NOTE — Progress Notes (Signed)
Physical Therapy Evaluation Patient Details Name: [name] MRN: [id number] DOB: 01/24/1946 Today's Date: 11/27/2011  Problem List:  Patient Active Problem List  Diagnoses  . MI (myocardial infarction)  . CAD (coronary artery disease)  . Heart failure  . S/P CABG (coronary artery bypass graft)  . PAD (peripheral artery disease)  . Hx-TIA (transient ischemic attack)  . Diabetes mellitus  . Hypertension  . Hyperlipidemia  . CVA (cerebral infarction)  . COPD (chronic obstructive pulmonary disease)  . H/O hyperthyroidism s/p thyroidectomy  . Peripheral neuropathy    Past Medical History:  Past Medical History  Diagnosis Date  . Diabetes mellitus     on Lantus 30 U   . MI (myocardial infarction)   . CAD (coronary artery disease)   . Heart failure     [place], [place] previous records.   . S/P CABG (coronary artery bypass graft)   . PAD (peripheral artery disease)    Past Surgical History:  Past Surgical History  Procedure Date  . Ptca   . Thyroidectomy   . Coronary artery bypass graft     2 valves  . Carotid endarterectomy   . Cholecystectomy, laparoscopic less than 6 months ago    PT Assessment/Plan/Recommendation PT Assessment Clinical Impression Statement: Pt presents with a medical diagnosis of CVA. Pt has no functional mobility deficits and does not need any further PT. WIll not follow acutely PT Recommendation/Assessment: Patent does not need any further PT services No Skilled PT: Patient at baseline level of functioning;All education completed;Patient will have necessary level of assist by caregiver at discharge PT Recommendation Follow Up Recommendations: No PT follow up;Supervision - Intermittent Equipment Recommended: None recommended by PT PT Goals     PT Evaluation Precautions/Restrictions    Prior Functioning  Home Living Lives With: Daughter Receives Help From: Family (daughter works) Type of Home: Apartment Home Layout: Other (Comment) (3rd  floor) Home Access: Stairs to enter Entrance Stairs-Rails: Right Entrance Stairs-Number of Steps: 20+ Bathroom Shower/Tub: Forensic scientist: Standard Bathroom Accessibility: Yes How Accessible: Accessible via walker Home Adaptive Equipment: Straight cane Prior Function Level of Independence: Needs assistance with tranfers (getting in out/of shower) Able to Take Stairs?: Yes Driving: No 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, Civil Service Overall Cognitive Status: Appears within functional limits for tasks assessed Orientation Level: Oriented X4 Sensation/Coordination Sensation Light Touch: Appears Intact Coordination Gross Motor Movements are Fluid and Coordinated: Yes Fine Motor Movements are Fluid and Coordinated: Yes Extremity Assessment RLE Assessment RLE Assessment: Within Functional Limits LLE Assessment LLE Assessment: Within Functional Limits Mobility (including Balance) Bed Mobility Bed Mobility: Yes Supine to Sit: 6: Modified independent (Device/Increase time) Sitting - Scoot to Edge of Bed: 6: Modified independent (Device/Increase time) Sit to Supine: 6: Modified independent (Device/Increase time) Transfers Transfers: Yes Sit to Stand: 5: Supervision Sit to Stand Details (indicate cue type and reason): VC for hand placement for safety Stand to Sit: 5: Supervision Stand to Sit Details: VC for hand placement for safety Ambulation/Gait Ambulation/Gait: Yes Ambulation/Gait Assistance: 5: Supervision Ambulation/Gait Assistance Details (indicate cue type and reason): Supervision for safety throughout ambulation Ambulation Distance (Feet): 150 Feet Assistive device: None Gait Pattern: Within Functional Limits Gait velocity: Normal gait speed Stairs: Yes Stairs Assistance: 5: Supervision Stairs Assistance Details (indicate cue type and reason): VC for hand placement and stair technique Stair Management Technique: One rail  Right;Forwards;Alternating pattern Number of Stairs: 2     Exercise    End of Session PT - End of Session Equipment  Utilized During Treatment: Gait belt Activity Tolerance: Patient tolerated treatment well Patient left: in bed;with call bell in reach;with family/visitor present Nurse Communication: Mobility status for transfers;Mobility status for ambulation General Behavior During Session: [facility] for tasks performed Cognition: [facility] for tasks performed  Milana Kidney 11/27/2011, 2:49 PM  11/27/2011 Milana Kidney DPT PAGER: [phone] OFFICE: [phone]

## 2011-11-27 NOTE — H&P (Signed)
14 woman recently moved from [place] to live with daughter.  Has severe vascular risk factors and several past vascular events including CABG, stents and carotid enterarterectomy. Admitted now for acute dizzyness and left sided scotomata which have resolved overnight.  BP = 167/117; Bmet nl except Glucose of 507 (A1c = 15); LDL = 170.  She has bilat carotid bruits and abnormal doppler: 40%+ on right and 60%+ on left.  MR shows multiple white matter lesions including a prominent one in right occipital lobe.  Her daughter plans to monitor a strict medical adherence regimen going forward.  Intends to enroll her in primary care practice that she attends. No acute distress.  Problems for this admission are a decision about invasive vascular intervention and establishing chronic management plans for LDL, BP, and diabetes. She does report a recent pattern of anorexia and ? indigestion and a 15 lb decline from her usual adult weight (180 to 165).  I am unclear whether there is a further GI problem beyond her poorly controlled DM and her active cerebrovascular diseases.  Suggest deferring this decision to her new PCP. Agree with residents' evaluation and plans.

## 2011-11-27 NOTE — Progress Notes (Signed)
Dr. Truitt Merle of elevated blood sugar, orders received, and completed.

## 2011-11-27 NOTE — Progress Notes (Signed)
S: Worsening SOB and wheezing last night, so treatment changed to duonebs scheduled, doxy, and prednisone to treat for COPD flair.    O:   Vital signs in last 24 hours:  Filed Vitals:    11/27/11 0254  11/27/11 0449  11/27/11 0511  11/27/11 1000   BP:  170/79   166/77  172/84   Pulse:  71   76  73   Temp:  97.6 F (36.4 C)   97.5 F (36.4 C)  97.9 F (36.6 C)   TempSrc:  Oral   Oral  Axillary   Resp:  18   20  18    Height:       Weight:       SpO2:  100%  100%  100%  96%    Weight change: 16 lb. weight loss in 3 months.   Intake/Output Summary (Last 24 hours) at 11/27/11 1047 Last data filed at 11/26/11 1236   Gross per 24 hour   Intake  0 ml   Output  0 ml   Net  0 ml    Physical Exam:  General: alert, well-developed, and cooperative to examination.  Head: normocephalic and atraumatic.  Eyes: Resolved visual changes. Vision grossly intact with good peripheral vision. Pupils equal, pupils round, pupils reactive to light, no injection and anicteric. Mild bilateral proptosis. Mild L eyelid ptosis present.  Supple, full ROM, no JVD.  Lungs: Mild wheezing diffusely.  No crackles heard on exam.  Heart: normal rate, regular rhythm, no murmur, no gallop, and no rub.   Assessment and Plan:  Stroke: She reports that her dizzyness has resolved this morning.  She got up and showered and had no symptoms.  Visual field symptoms also resolved today.  For risk factor management, she reports that she has not been taking her simvastatin, so cannot assess its efficacy for her based on her high LDL here.  She also has not been taking her Losartan or Carvedilol, so cannot assess her BP regimen either.  She has not been taking her insulin either.  Due to these risk factors being out of control, would not consider this a true aspirin/plavix failure.   -Emphasized need for compliance to risk factor control regimen -2D echocardiogram-pending  -PT/OT  -Advance diet as tolerated to carb modified  medium -Will consult Neuro    COPD: Based on increased breathing and SOB last night, started treating her for COPD flair.  Lungs still have some wheezing today, but she has not had a nebulizer since last night as she was getting echo when RT came.  For home regimen, has been on advair in past but could not afford it.  She has a nebulizer at home -Cont pred, doxy, and sched nebs -Consider starting long acting inhaler such as Spiriva or Advair on discharge. Alternatively, if she cannot afford one of these, a nebulizer regimen would be an alternative.     CHF:  Suspect systolic CHF given her history of CAD and MI. Lungs do not have crackles on exam but these may be masked by poor airflow. Her home medications, Lasix 40mg  PO BID, restart carvedilol and losartan.  -Will attempt to get her past medical records for her actual diagnosis and most recent catheterization, EKG, head imaging -Echo result: pending   DM: Agree with MS3 A/P  Early satiety: Agree with MS3 A/P  History of Thyroid Disease: Will see if this is in records from [place]

## 2011-11-27 NOTE — Progress Notes (Signed)
Subjective: Overnight, Ms. Burggraf had worsening SOB.  Her O2 sat on 3L oxygen was 98%.  She was not tachycardic and she did not feel anxious.  Physicians on night float changed her duoneb from PRN to standing and initiated one dose of 40mg  prednisone. She was also started on doxycycline 100BID due to concerns for infection.  Her SOB subsided by the morning.  She feels better today compared to yesterday.  Her dizziness and visual field changes had resolved overnight.  She regained good peripheral vision.  O2 sat w/o Anthon was 98% this morning. She continues to have poor appetite and early satiety. She denied chest pain, SOB, and abdominal pain.     Objective: Vital signs in last 24 hours: Filed Vitals:   11/27/11 0254 11/27/11 0449 11/27/11 0511 11/27/11 1000  BP: 170/79  166/77 172/84  Pulse: 71  76 73  Temp: 97.6 F (36.4 C)  97.5 F (36.4 C) 97.9 F (36.6 C)  TempSrc: Oral  Oral Axillary  Resp: 18  20 18   Height:      Weight:      SpO2: 100% 100% 100% 96%   Weight change:  16 lb. weight loss in 3 months.  Intake/Output Summary (Last 24 hours) at 11/27/11 1047 Last data filed at 11/26/11 1236  Gross per 24 hour  Intake      0 ml  Output      0 ml  Net      0 ml   Physical Exam: General: alert, well-developed, and cooperative to examination.  Head: normocephalic and atraumatic.  Eyes: Resolved visual changes. Vision grossly intact with good peripheral vision. Pupils equal, pupils round, pupils reactive to light, no injection and anicteric. Mild bilateral proptosis. Mild L eyelid ptosis present.  Mouth: pharynx pink and moist, no erythema, and no exudates.  Neck: Bilateral carotid bruits and surgical scar over L carotid. Supple, full ROM, no JVD.  Lungs: Decreased air movement bilaterally with diffuse mild wheezing. No crackles heard on exam.  Heart: normal rate, regular rhythm, no murmur, no gallop, and no rub.  Pulses: diminished but present 1+ DP/PT pulses bilaterally Extremities:  Trace edema halfway up calf bilaterally. No cyanosis, clubbing. L foot has more pigmentation than right.  Neurologic: alert & oriented X3, cranial nerves II-XII intact, strength normal in all extremities, sensation intact to light touch, and gait normal.  Skin: turgor normal and no rashes.   Lab Results: Basic Metabolic Panel:  Lab 11/25/11 1610  NA 132*  K 4.4  CL 95*  CO2 30  GLUCOSE 507*  BUN 15  CREATININE 1.01  CALCIUM 9.5  MG --  PHOS --   Liver Function Tests: Pending   CBC:  Lab 11/25/11 2232  WBC 6.5  NEUTROABS 4.8  HGB 11.7*  HCT 34.4*  MCV 89.4  PLT 194    Lab 11/27/11 0732 11/26/11 2252 11/26/11 1952 11/26/11 1740 11/26/11 0838 11/26/11 0650  GLUCAP 117* 240* 336* 479* 237* 257*   Hemoglobin A1C:  Lab 11/26/11 1516  HGBA1C 15.2*   Fasting Lipid Panel:  Lab 11/27/11 0600  CHOL 247*  HDL 37*  LDLCALC 171*  TRIG 194*  CHOLHDL 6.7  LDLDIRECT --   Thyroid Function Tests:  Lab 11/26/11 1516  TSH 1.869  T4TOTAL --  FREET4 0.96  T3FREE --  THYROIDAB --   Urine Drug Screen: Drugs of Abuse     Component Value Date/Time   LABOPIA NONE DETECTED 11/26/2011 1937   COCAINSCRNUR NONE DETECTED 11/26/2011  1937   LABBENZ NONE DETECTED 11/26/2011 1937   AMPHETMU NONE DETECTED 11/26/2011 1937   THCU NONE DETECTED 11/26/2011 1937   LABBARB NONE DETECTED 11/26/2011 1937    Urinalysis:  Lab 11/25/11 2245  COLORURINE YELLOW  LABSPEC <1.005*  PHURINE 5.5  GLUCOSEU >1000*  HGBUR NEGATIVE  BILIRUBINUR NEGATIVE  KETONESUR NEGATIVE  PROTEINUR NEGATIVE  UROBILINOGEN 0.2  NITRITE NEGATIVE  LEUKOCYTESUR NEGATIVE   Studies/Results: Mr Lodema Pilot Contrast  11/27/2011  **ADDENDUM** CREATED: 11/27/2011 09:35:13  The impressions states left parietal.  The area of signal abnormality is in the posterior right parietal lobe.  **END ADDENDUM** SIGNED BY: Chauncey Fischer, M.D.    11/26/2011  *RADIOLOGY REPORT*  Clinical Data: Dizziness.  MRI HEAD WITHOUT AND WITH  CONTRAST  Technique:  Multiplanar, multiecho pulse sequences of the brain and surrounding structures were obtained according to standard protocol without and with intravenous contrast  Contrast: 20mL MULTIHANCE GADOBENATE DIMEGLUMINE 529 MG/ML IV SOLN  Comparison: None.  Findings: A focal area of restricted diffusion is present in the posterior right parietal lobe.  T2 and FLAIR hyperintensity is evident.  There is also focal cortical enhancement along the area of restricted diffusion.  There is no definite hemorrhage.  No mass lesion is evident.  Multiple patchy subcortical T2 and FLAIR hyperintensities are present bilaterally.  Confluent periventricular white matter changes evident.  Flow is present in the major intracranial arteries.  The patient is status post left lens extraction.  The globes and orbits are otherwise intact.  IMPRESSION:  1.  Left parietal area of restricted diffusion, T2 hyperintensity, and enhancement.  This likely represents a subacute infarct. Enhancement can be seen as early as 3 days. The differential diagnosis includes also a venous infarct.  Neoplasm or demyelinating process is less likely.  This appears to involve the cortex.  Short-term follow-up examination at 1-2 months would be useful to follow this lesion. 2.  Extensive white matter changes elsewhere likely reflect the sequelae of chronic microvascular ischemia.  Original Report Authenticated By: Jamesetta Orleans. MATTERN, M.D.   Dg Chest Port 1 View  11/26/2011  *RADIOLOGY REPORT*  Clinical Data: Shortness of breath, wheezing at the lung bases  PORTABLE CHEST - 1 VIEW  Comparison: None.  Findings: Moderate enlargement of the cardiomediastinal silhouette is noted with evidence of prior CABG.  Central vascular congestion is noted.  A few Kerley B lines are noted at the right and left bases.  No evidence for alveolar consolidation.  Trace if any pleural fluid is present.  No pneumothorax.  Clips project over the neck.  IMPRESSION:  Moderate cardiomegaly with probable trace interstitial edema. Follow-up after presumed diuresis is recommended with PA and lateral chest radiographs.  Original Report Authenticated By: Harrel Lemon, M.D.   Mr Mra Head/brain Wo Cm  11/27/2011  *RADIOLOGY REPORT*  Clinical Data: CVA.  Abnormal brain MRI.  MRA HEAD WITHOUT CONTRAST  Technique: Angiographic images of the Circle of Willis were obtained using MRA technique without intravenous contrast.  Comparison: MRI brain 11/26/2011.  Findings: The study is moderately degraded by patient motion.  A second acquisition was attempted.  There is greater motion on the second to attempt.  Mild irregularity is evident through the cavernous carotid arteries bilaterally.  There is focal signal loss in the distal right A1 segment.  The A1 and M1 segments are otherwise unremarkable.  A small anterior communicating artery is patent.  The MCA bifurcations are unremarkable.  There is segmental attenuation of distal  ACA and MCA branch vessels.  There is focal signal loss within the right pericallosal artery just beyond the bifurcation.  The right vertebral artery is the dominant vessel.  The left vertebral artery bifurcates at the PICA with focal signal loss in the left vertebral artery just beyond the PICA origin.  The basilar artery is somewhat irregular but without focal stenosis.  Both posterior cerebral arteries originate from basilar tip.  There is attenuation of distal PCA branch vessels.  IMPRESSION:  1.  Moderate medium and small vessel disease. 2.  At least a moderate focal stenosis in the left vertebral artery just beyond the PICA origin. 3.  No other significant proximal stenosis, aneurysm or branch vessel occlusion.  Original Report Authenticated By: Jamesetta Orleans. MATTERN, M.D.   Medications:  I have reviewed patient's medications.  Scheduled Meds:   . ipratropium  0.5 mg Nebulization Q4H   And  . albuterol  2.5 mg Nebulization Q4H  . aspirin  81 mg Oral  Daily  . carvedilol  25 mg Oral To Major  . carvedilol  25 mg Oral BID WC  . cilostazol  100 mg Oral BID  . clopidogrel  75 mg Oral Q breakfast  . doxycycline  100 mg Oral Q12H  . enoxaparin  40 mg Subcutaneous Q24H  . furosemide  40 mg Oral BID  . gabapentin  300 mg Oral BID  . insulin aspart  0-15 Units Subcutaneous TID WC  . insulin aspart  10 Units Subcutaneous Once  . insulin glargine  30 Units Subcutaneous QHS  . isosorbide mononitrate  30 mg Oral To Major  . isosorbide mononitrate  60 mg Oral Daily  . LORazepam      . losartan  50 mg Oral Daily  . predniSONE  40 mg Oral NOW  . simvastatin  40 mg Oral QHS  . sodium chloride  3 mL Intravenous Q12H  . DISCONTD: albuterol  2.5 mg Nebulization Q4H  . DISCONTD: albuterol  2.5 mg Nebulization Q4H  . DISCONTD: aspirin  81 mg Oral Daily  . DISCONTD: clopidogrel  75 mg Oral Daily  . DISCONTD: furosemide  20 mg Oral BID  . DISCONTD: ipratropium  0.5 mg Nebulization Q4H   Continuous Infusions:  PRN Meds:.LORazepam, sodium chloride, traMADol, DISCONTD: albuterol, DISCONTD: albuterol, DISCONTD: gi cocktail, DISCONTD: ipratropium, DISCONTD: ipratropium, DISCONTD: LORazepam, DISCONTD: ondansetron (ZOFRAN) IV, DISCONTD: traMADol Assessment/Plan:  Stroke: Three days of room-spinning dizzyness at all times when standing. No dizzyness laying or sitting or with turning head while laying or sitting. Intermittently sees spinning flashing light in left visual field bilaterally. Neuro exam normal with exception of visual symptoms and L eye ptosis that patient thinks she has had for a long time. MRI finding of likely subacute infarct in right occipital lobe correlates with visual field finding. Her dizziness and visual field symptoms resolved a day after her admission.  She passed swallow screen in ED.  Carotid dopplers showed 40~59% of stenosis on the right side and 60~79% on the left side. She had a L. carotid endarterectomy five years ago.  MRA brain  showed moderate medium and small vessel disease and at least a moderate focal stenosis in the left vertebral artery just beyond PICA region.  Her home medication, plavix, aspirin, simvastatin, Coreg, Imdur, and Losartan were continued.  Lipid panel showed LDL 171, HDL 37, and TG 194.  She will need better long term control of cholesterol after discharge.  -2D echocardiogram-pending  -PT/OT  -Advance diet as tolerated  to carb modified medium   COPD: Patient has had 2 weeks of cough and wheezing. Substantial wheezing on exam with poor air flow bilaterally.  In the evening of 2/2, patient had worsening SOB and wheezing.  Given concerns for infection, a five day doxycycline regimen, 100mg  BID was started for patient.   -PRN nebulizer--> changed to standing on 2/3 due to worsening of wheezing, consider changing back to PRN when symptoms improve. -Consider starting long acting inhaler such as Spiriva on discharge   CHF: Trace lower extremity pitting edema bilaterally and chest xray showing trace interstitial edema. Suspect systolic CHF given her history of CAD and MI. Lungs do not have crackles on exam but these may be masked by poor airflow. Her home medications, Lasix 40mg  PO BID, carvedilol and losartan.   -Will attempt to get her past medical records for her actual diagnosis and most recent catheterization and echo records  -Echo result: pending  DM: High sugars on presentation likely due to her not taking her insulin for a while (per patient report). During her stay, patient has received Lantus 30 units QHS along with sliding scale insulin.  Patient's hemoglobin A1C is 15.2, indicating poorly controlled DM consistent with patient's low medication adherence.  Patient's family agree with better long term control once patient initiates care with a PCP after discharge.   Early satiety: Patient has had poor appetite, early satiety, and loose stools with moderate amount of food intake.  Patient has not been  eating much due to poor appetite and early satiety.  They probably have contributed to her recent weight loss.  She denied abdominal pain, constipation, melena, and hematochezia.  She has had a colonoscopy done with normal result recently.  The cause of his symptoms is unclear at this time.   -LFTs will be ordered to r/o hepatic etiologies.  History of Thyroid Disease: Diagnosis uncertain. Mild proptosis present on eye exam. Patient cannot recall if she had hyperthyroid or hypothyroid, but she did have a 90% thyroidectomy many years ago.  TSH (1.869) and free T4 (0.96) were within the normal range.  TSH should be checked periodically after discharge by PCP.    DVT Prophylaxis: SubQ heparin  Full Code    LOS: 2 days   Janssen Zee 11/27/2011, 10:47 AM

## 2011-11-27 NOTE — Progress Notes (Signed)
  Echocardiogram 2D Echocardiogram has been performed.  Jorje Guild [facility] 11/27/2011, 9:56 AM

## 2011-11-27 NOTE — Consult Note (Signed)
Chief Complaint: "vertigo and visual changes and imbalance"  HPI: [name] is an 66 y.o. female who has complained of seeing zig-zags and having vertigo and ataxia since Wednesday. She is here visiting her daughter in [place]. Patient has not been compliant with her medications. She was brought into ED on Friday.   LSN: Wednesday tPA Given: No: out of window mRankin: 0  Past Medical History  Diagnosis Date  . Diabetes mellitus     on Lantus 30 U   . MI (myocardial infarction)   . CAD (coronary artery disease)   . Heart failure     [place], [place] previous records.   . S/P CABG (coronary artery bypass graft)   . PAD (peripheral artery disease)    Past Surgical History  Procedure Date  . Ptca   . Thyroidectomy   . Coronary artery bypass graft     2 valves  . Carotid endarterectomy   . Cholecystectomy, laparoscopic less than 6 months ago   History reviewed. No pertinent family history. Social History:  reports that she quit smoking about a year ago. Her smoking use included Cigarettes. She has a 50 pack-year smoking history. She has never used smokeless tobacco. She reports that she does not drink alcohol or use illicit drugs.  Allergies:  Allergies  Allergen Reactions  . Crestor (Rosuvastatin Calcium)     Muscle Pain  . Lisinopril Itching  . Vicodin (Hydrocodone-Acetaminophen) Itching   Medications: I have reviewed the patient's current medications.  ROS: as above  Physical Examination: Blood pressure 113/69, pulse 100, temperature 97.9 F (36.6 C), temperature source Oral, resp. rate 18, height 5\' 2"  (1.575 m), weight 74.39 kg (164 lb), SpO2 97.00%.  Neurologic Examination: MS: AAO*3, no aphasia, follows complex commands CN: EOMI, PERRL, no face asymmetry, right-beating nystagmus, no dysarthria, V1-V3 sensation is intact b/l Motor: no drift, 5/5 strength in all ext b/l Sensory: no deficit to LT/PP in all ext. B/l Coord: F to N intact b/l Reflexes: trace in  UE b/l, 0 reflexes in LE b/l Gait: Romberg was negative. No ataxia.   Results for orders placed during the hospital encounter of 11/25/11 (from the past 48 hour(s))  CBC     Status: Abnormal   Collection Time   11/25/11 10:32 PM      Component Value Range Comment   WBC 6.5  4.0 - 10.5 (K/uL)    RBC 3.85 (*) 3.87 - 5.11 (MIL/uL)    Hemoglobin 11.7 (*) 12.0 - 15.0 (g/dL)    HCT 16.1 (*) 09.6 - 46.0 (%)    MCV 89.4  78.0 - 100.0 (fL)    MCH 30.4  26.0 - 34.0 (pg)    MCHC 34.0  30.0 - 36.0 (g/dL)    RDW 04.5  40.9 - 81.1 (%)    Platelets 194  150 - 400 (K/uL)   DIFFERENTIAL     Status: Normal   Collection Time   11/25/11 10:32 PM      Component Value Range Comment   Neutrophils Relative 75  43 - 77 (%)    Neutro Abs 4.8  1.7 - 7.7 (K/uL)    Lymphocytes Relative 17  12 - 46 (%)    Lymphs Abs 1.1  0.7 - 4.0 (K/uL)    Monocytes Relative 7  3 - 12 (%)    Monocytes Absolute 0.4  0.1 - 1.0 (K/uL)    Eosinophils Relative 1  0 - 5 (%)    Eosinophils Absolute 0.1  0.0 -  0.7 (K/uL)    Basophils Relative 0  0 - 1 (%)    Basophils Absolute 0.0  0.0 - 0.1 (K/uL)   BASIC METABOLIC PANEL     Status: Abnormal   Collection Time   11/25/11 10:32 PM      Component Value Range Comment   Sodium 132 (*) 135 - 145 (mEq/L)    Potassium 4.4  3.5 - 5.1 (mEq/L)    Chloride 95 (*) 96 - 112 (mEq/L)    CO2 30  19 - 32 (mEq/L)    Glucose, Bld 507 (*) 70 - 99 (mg/dL)    BUN 15  6 - 23 (mg/dL)    Creatinine, Ser 9.14  0.50 - 1.10 (mg/dL)    Calcium 9.5  8.4 - 10.5 (mg/dL)    GFR calc non Af Amer 57 (*) >90 (mL/min)    GFR calc Af Amer 66 (*) >90 (mL/min)   URINALYSIS, ROUTINE W REFLEX MICROSCOPIC     Status: Abnormal   Collection Time   11/25/11 10:45 PM      Component Value Range Comment   Color, Urine YELLOW  YELLOW     APPearance CLEAR  CLEAR     Specific Gravity, Urine <1.005 (*) 1.005 - 1.030     pH 5.5  5.0 - 8.0     Glucose, UA >1000 (*) NEGATIVE (mg/dL)    Hgb urine dipstick NEGATIVE  NEGATIVE      Bilirubin Urine NEGATIVE  NEGATIVE     Ketones, ur NEGATIVE  NEGATIVE (mg/dL)    Protein, ur NEGATIVE  NEGATIVE (mg/dL)    Urobilinogen, UA 0.2  0.0 - 1.0 (mg/dL)    Nitrite NEGATIVE  NEGATIVE     Leukocytes, UA NEGATIVE  NEGATIVE    URINE MICROSCOPIC-ADD ON     Status: Normal   Collection Time   11/25/11 10:45 PM      Component Value Range Comment   Squamous Epithelial / LPF RARE  RARE     WBC, UA 0-2  <3 (WBC/hpf)    RBC / HPF 0-2  <3 (RBC/hpf)    Bacteria, UA RARE  RARE    GLUCOSE, CAPILLARY     Status: Abnormal   Collection Time   11/26/11  1:03 AM      Component Value Range Comment   Glucose-Capillary 291 (*) 70 - 99 (mg/dL)    Comment 1 Documented in Chart      Comment 2 Notify RN     GLUCOSE, CAPILLARY     Status: Abnormal   Collection Time   11/26/11  2:59 AM      Component Value Range Comment   Glucose-Capillary 182 (*) 70 - 99 (mg/dL)    Comment 1 Notify RN     GLUCOSE, CAPILLARY     Status: Abnormal   Collection Time   11/26/11  6:50 AM      Component Value Range Comment   Glucose-Capillary 257 (*) 70 - 99 (mg/dL)    Comment 1 Notify RN     GLUCOSE, CAPILLARY     Status: Abnormal   Collection Time   11/26/11  8:38 AM      Component Value Range Comment   Glucose-Capillary 237 (*) 70 - 99 (mg/dL)    Comment 1 Documented in Chart     HEMOGLOBIN A1C     Status: Abnormal   Collection Time   11/26/11  3:16 PM      Component Value Range Comment   Hemoglobin A1C  15.2 (*) <5.7 (%)    Mean Plasma Glucose 390 (*) <117 (mg/dL)   TSH     Status: Normal   Collection Time   11/26/11  3:16 PM      Component Value Range Comment   TSH 1.869  0.350 - 4.500 (uIU/mL)   T4, FREE     Status: Normal   Collection Time   11/26/11  3:16 PM      Component Value Range Comment   Free T4 0.96  0.80 - 1.80 (ng/dL)   GLUCOSE, CAPILLARY     Status: Abnormal   Collection Time   11/26/11  5:40 PM      Component Value Range Comment   Glucose-Capillary 479 (*) 70 - 99 (mg/dL)   URINE RAPID DRUG SCREEN  (HOSP PERFORMED)     Status: Normal   Collection Time   11/26/11  7:37 PM      Component Value Range Comment   Opiates NONE DETECTED  NONE DETECTED     Cocaine NONE DETECTED  NONE DETECTED     Benzodiazepines NONE DETECTED  NONE DETECTED     Amphetamines NONE DETECTED  NONE DETECTED     Tetrahydrocannabinol NONE DETECTED  NONE DETECTED     Barbiturates NONE DETECTED  NONE DETECTED    GLUCOSE, CAPILLARY     Status: Abnormal   Collection Time   11/26/11  7:52 PM      Component Value Range Comment   Glucose-Capillary 336 (*) 70 - 99 (mg/dL)    Comment 1 Documented in Chart      Comment 2 Notify RN     GLUCOSE, CAPILLARY     Status: Abnormal   Collection Time   11/26/11 10:52 PM      Component Value Range Comment   Glucose-Capillary 240 (*) 70 - 99 (mg/dL)    Comment 1 Documented in Chart      Comment 2 Notify RN     LIPID PANEL     Status: Abnormal   Collection Time   11/27/11  6:00 AM      Component Value Range Comment   Cholesterol 247 (*) 0 - 200 (mg/dL)    Triglycerides 161 (*) <150 (mg/dL)    HDL 37 (*) >09 (mg/dL)    Total CHOL/HDL Ratio 6.7      VLDL 39  0 - 40 (mg/dL)    LDL Cholesterol 604 (*) 0 - 99 (mg/dL)   GLUCOSE, CAPILLARY     Status: Abnormal   Collection Time   11/27/11  7:32 AM      Component Value Range Comment   Glucose-Capillary 117 (*) 70 - 99 (mg/dL)   GLUCOSE, CAPILLARY     Status: Abnormal   Collection Time   11/27/11 11:53 AM      Component Value Range Comment   Glucose-Capillary 506 (*) 70 - 99 (mg/dL)    Comment 1 Repeat Test     GLUCOSE, CAPILLARY     Status: Abnormal   Collection Time   11/27/11 11:54 AM      Component Value Range Comment   Glucose-Capillary 448 (*) 70 - 99 (mg/dL)    Mr Laqueta Jean Wo Contrast  11/27/2011  **ADDENDUM** CREATED: 11/27/2011 09:35:13  The impressions states left parietal.  The area of signal abnormality is in the posterior right parietal lobe.  **END ADDENDUM** SIGNED BY: Chauncey Fischer, M.D.    11/26/2011  *RADIOLOGY  REPORT*  Clinical Data: Dizziness.  MRI HEAD WITHOUT AND WITH  CONTRAST  Technique:  Multiplanar, multiecho pulse sequences of the brain and surrounding structures were obtained according to standard protocol without and with intravenous contrast  Contrast: 20mL MULTIHANCE GADOBENATE DIMEGLUMINE 529 MG/ML IV SOLN  Comparison: None.  Findings: A focal area of restricted diffusion is present in the posterior right parietal lobe.  T2 and FLAIR hyperintensity is evident.  There is also focal cortical enhancement along the area of restricted diffusion.  There is no definite hemorrhage.  No mass lesion is evident.  Multiple patchy subcortical T2 and FLAIR hyperintensities are present bilaterally.  Confluent periventricular white matter changes evident.  Flow is present in the major intracranial arteries.  The patient is status post left lens extraction.  The globes and orbits are otherwise intact.  IMPRESSION:  1.  Left parietal area of restricted diffusion, T2 hyperintensity, and enhancement.  This likely represents a subacute infarct. Enhancement can be seen as early as 3 days. The differential diagnosis includes also a venous infarct.  Neoplasm or demyelinating process is less likely.  This appears to involve the cortex.  Short-term follow-up examination at 1-2 months would be useful to follow this lesion. 2.  Extensive white matter changes elsewhere likely reflect the sequelae of chronic microvascular ischemia.  Original Report Authenticated By: Jamesetta Orleans. MATTERN, M.D.   Dg Chest Port 1 View  11/26/2011  *RADIOLOGY REPORT*  Clinical Data: Shortness of breath, wheezing at the lung bases  PORTABLE CHEST - 1 VIEW  Comparison: None.  Findings: Moderate enlargement of the cardiomediastinal silhouette is noted with evidence of prior CABG.  Central vascular congestion is noted.  A few Kerley B lines are noted at the right and left bases.  No evidence for alveolar consolidation.  Trace if any pleural fluid is present.   No pneumothorax.  Clips project over the neck.  IMPRESSION: Moderate cardiomegaly with probable trace interstitial edema. Follow-up after presumed diuresis is recommended with PA and lateral chest radiographs.  Original Report Authenticated By: Harrel Lemon, M.D.   Mr Mra Head/brain Wo Cm  11/27/2011  *RADIOLOGY REPORT*  Clinical Data: CVA.  Abnormal brain MRI.  MRA HEAD WITHOUT CONTRAST  Technique: Angiographic images of the Circle of Willis were obtained using MRA technique without intravenous contrast.  Comparison: MRI brain 11/26/2011.  Findings: The study is moderately degraded by patient motion.  A second acquisition was attempted.  There is greater motion on the second to attempt.  Mild irregularity is evident through the cavernous carotid arteries bilaterally.  There is focal signal loss in the distal right A1 segment.  The A1 and M1 segments are otherwise unremarkable.  A small anterior communicating artery is patent.  The MCA bifurcations are unremarkable.  There is segmental attenuation of distal ACA and MCA branch vessels.  There is focal signal loss within the right pericallosal artery just beyond the bifurcation.  The right vertebral artery is the dominant vessel.  The left vertebral artery bifurcates at the PICA with focal signal loss in the left vertebral artery just beyond the PICA origin.  The basilar artery is somewhat irregular but without focal stenosis.  Both posterior cerebral arteries originate from basilar tip.  There is attenuation of distal PCA branch vessels.  IMPRESSION:  1.  Moderate medium and small vessel disease. 2.  At least a moderate focal stenosis in the left vertebral artery just beyond the PICA origin. 3.  No other significant proximal stenosis, aneurysm or branch vessel occlusion.  Original Report Authenticated By: Jamesetta Orleans. MATTERN, M.D.  Assessment: 66 y.o. 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 She has been non-compliant with her medications and has uncontrolled HTN, DM and HLD as well as CHF.  Stroke Risk Factors - HTN, DM, HLD  Plan: 1. HgbA1c, fasting lipid panel - done on statin and diabetes management 2. MRI, MRA  of the brain without contrast - done 3. PT consult, OT consult, Speech consult - done 4. Echocardiogram - done 5. Carotid dopplers - done 6. Prophylactic therapy-Antiplatelet med: Aspirin - dose 81 mg PO daily 7. Risk factor modification - done 8. Stroke team will follow  Aleeta Schmaltz 11/27/2011, 3:14 PM

## 2011-11-28 DIAGNOSIS — E785 Hyperlipidemia, unspecified: Secondary | ICD-10-CM

## 2011-11-28 DIAGNOSIS — I6350 Cerebral infarction due to unspecified occlusion or stenosis of unspecified cerebral artery: Principal | ICD-10-CM

## 2011-11-28 DIAGNOSIS — I509 Heart failure, unspecified: Secondary | ICD-10-CM

## 2011-11-28 DIAGNOSIS — I10 Essential (primary) hypertension: Secondary | ICD-10-CM

## 2011-11-28 DIAGNOSIS — E119 Type 2 diabetes mellitus without complications: Secondary | ICD-10-CM

## 2011-11-28 LAB — BASIC METABOLIC PANEL
Calcium: 9.6 mg/dL (ref 8.4–10.5)
Creatinine, Ser: 0.96 mg/dL (ref 0.50–1.10)
GFR calc Af Amer: 70 mL/min — ABNORMAL LOW (ref >90)
GFR calc non Af Amer: 61 mL/min — ABNORMAL LOW (ref >90)

## 2011-11-28 MED ORDER — INSULIN GLARGINE 100 UNIT/ML ~~LOC~~ SOLN: 35.0000 [IU] | Freq: Every day | SUBCUTANEOUS | Status: DC

## 2011-11-28 MED ORDER — SIMVASTATIN 40 MG PO TABS: 40.0000 mg | ORAL_TABLET | Freq: Every day | ORAL | Status: DC

## 2011-11-28 MED ORDER — MOMETASONE FUROATE 50 MCG/ACT NA SUSP: 2.0000 | Freq: Every day | NASAL | Status: DC

## 2011-11-28 MED ORDER — BLOOD GLUCOSE MONITORING SUPPL W/DEVICE KIT: PACK | Status: DC

## 2011-11-28 MED ORDER — ALBUTEROL SULFATE (5 MG/ML) 0.5% IN NEBU
2.5000 mg | INHALATION_SOLUTION | Freq: Four times a day (QID) | RESPIRATORY_TRACT | Status: DC
  Administered 2011-11-28: 2.5 mg via RESPIRATORY_TRACT
  Filled 2011-11-28: qty 0.5

## 2011-11-28 MED ORDER — INSULIN ASPART 100 UNIT/ML ~~LOC~~ SOLN: SUBCUTANEOUS | Status: DC

## 2011-11-28 MED ORDER — LIVING WELL WITH DIABETES BOOK
Freq: Once | Status: DC
  Filled 2011-11-28: qty 1

## 2011-11-28 MED ORDER — POTASSIUM CHLORIDE CRYS ER 20 MEQ PO TBCR: 20.0000 meq | EXTENDED_RELEASE_TABLET | Freq: Two times a day (BID) | ORAL | Status: DC

## 2011-11-28 MED ORDER — CLOPIDOGREL BISULFATE 75 MG PO TABS: 75.0000 mg | ORAL_TABLET | Freq: Every day | ORAL | Status: DC

## 2011-11-28 MED ORDER — FUROSEMIDE 40 MG PO TABS: 40.0000 mg | ORAL_TABLET | Freq: Two times a day (BID) | ORAL | Status: DC

## 2011-11-28 MED ORDER — LOSARTAN POTASSIUM 50 MG PO TABS: 50.0000 mg | ORAL_TABLET | Freq: Every day | ORAL | Status: DC

## 2011-11-28 MED ORDER — ALBUTEROL SULFATE (5 MG/ML) 0.5% IN NEBU: 2.5000 mg | INHALATION_SOLUTION | Freq: Four times a day (QID) | RESPIRATORY_TRACT | Status: DC

## 2011-11-28 MED ORDER — ALBUTEROL SULFATE (5 MG/ML) 0.5% IN NEBU: 2.5000 mg | INHALATION_SOLUTION | RESPIRATORY_TRACT | Status: DC | PRN

## 2011-11-28 MED ORDER — IPRATROPIUM BROMIDE 0.02 % IN SOLN: 0.5000 mg | Freq: Four times a day (QID) | RESPIRATORY_TRACT | Status: DC

## 2011-11-28 MED ORDER — INSULIN PEN STARTER KIT
1.0000 | Freq: Once | Status: DC
  Filled 2011-11-28: qty 1

## 2011-11-28 MED ORDER — IPRATROPIUM BROMIDE 0.02 % IN SOLN
0.5000 mg | Freq: Four times a day (QID) | RESPIRATORY_TRACT | Status: DC
  Administered 2011-11-28: 0.5 mg via RESPIRATORY_TRACT
  Filled 2011-11-28: qty 2.5

## 2011-11-28 MED ORDER — CARVEDILOL 25 MG PO TABS: 25.0000 mg | ORAL_TABLET | Freq: Two times a day (BID) | ORAL | Status: DC
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
## 2011-11-28 NOTE — Progress Notes (Signed)
Internal Medicine Attending  Date: 11/28/2011  Patient name: [name] Medical record number: [id number] Date of birth: 1946-01-02 Age: 66 y.o. Gender: female  I saw and evaluated the patient. I reviewed the resident's note by Dr. Yaakov Guthrie and I agree with the resident's findings and plans as documented in his note.  We have restarted [name]'s cardiovascular regimen in order to treat the risk factors for further cerebrovascular disease complications. Further adjustments in her blood pressure, lipid, and diabetic management will be necessary as an outpatient in order to achieve the desired goals. Her daughter has informed her that she has set up an appointment for [name] with a new primary care provider. I agree with discharge today.

## 2011-11-28 NOTE — Progress Notes (Signed)
Inpatient Diabetes Program Recommendations  AACE/ADA: New Consensus Statement on Inpatient Glycemic Control (2009)  Target Ranges:  Prepandial:   less than 140 mg/dL      Peak postprandial:   less than 180 mg/dL (1-2 hours)      Critically ill patients:  140 - 180 mg/dL   Reason for Visit: Post-prandial hyperlgycemia Inpatient Diabetes Program Recommendations Insulin - Meal Coverage: Fasting cbg's are good once the previous day's glucoses have been corrected.  Pt. needs meal coverage . please add 3-4 units Novolog meal coverage tidwc please.  Note: Thank you, Lenor Coffin, RN, CNS, Diabetes Coordinator [phone])

## 2011-11-28 NOTE — Progress Notes (Signed)
Subjective: Overnight, Ms. Bachand's BG was 526 and BP was in the 180s.  She received 20 units of Novolog and hydralazine 10mg  IV.  She was stable this morning after her breathing treatment.  Her symptoms of dizziness and seeing flashing light remain resolved.  She still has some airway tightness and upper extremity weakness, which is the same as her baseline.  She has no complains of chest pain, SOB, and abdominal pain.  Her appetite has improved since yesterday.   Objective: Vital signs in last 24 hours: Filed Vitals:   11/28/11 0329 11/28/11 0512 11/28/11 0824 11/28/11 0826  BP:  163/77    Pulse:  69 80   Temp:  98.3 F (36.8 C)    TempSrc:  Oral    Resp:  20 12   Height:      Weight:  68.266 kg (150 lb 8 oz)    SpO2: 95% 93%  94%   Weight change: -6.124 kg (-13 lb 8 oz) 16 lb. weight loss in 3 months.  Intake/Output Summary (Last 24 hours) at 11/28/11 1058 Last data filed at 11/28/11 0909  Gross per 24 hour  Intake    840 ml  Output   1200 ml  Net   -360 ml   Physical Exam: General: alert, well-developed, and cooperative to examination.  Head: normocephalic and atraumatic.  Eyes: Resolved visual changes. Vision grossly intact with good peripheral vision. Pupils equal, pupils round, pupils reactive to light, no injection and anicteric. Mild bilateral proptosis. Mild L eyelid ptosis present.  Neck: Bilateral carotid bruits and surgical scar over L carotid. Supple, full ROM, no JVD.  Lungs: Decreased air movement bilaterally with diffuse mild wheezing, improved from yesterday. No crackles heard on exam.  Heart: normal rate, regular rhythm, no murmur, no gallop, and no rub.  Pulses: diminished but present 1+ DP/PT pulses bilaterally Extremities: Trace edema halfway up calf bilaterally. No cyanosis, clubbing. L foot has more pigmentation than right.  Neurologic: alert & oriented X3, cranial nerves II-XII intact, strength normal in all extremities, sensation intact to light touch, and  gait normal.  Skin: turgor normal and no rashes.   Lab Results: Basic Metabolic Panel:  Lab 11/28/11 1610 11/25/11 2232  NA 139 132*  K 3.4* 4.4  CL 103 95*  CO2 28 30  GLUCOSE 121* 507*  BUN 22 15  CREATININE 0.96 1.01  CALCIUM 9.6 9.5  MG -- --  PHOS -- --   CBC:  Lab 11/25/11 2232  WBC 6.5  NEUTROABS 4.8  HGB 11.7*  HCT 34.4*  MCV 89.4  PLT 194    Lab 11/28/11 0659 11/28/11 0254 11/27/11 2339 11/27/11 2121 11/27/11 1634 11/27/11 1154  GLUCAP 140* 224* 403* 526* 491* 448*   Hemoglobin A1C:  Lab 11/26/11 1516  HGBA1C 15.2*   Fasting Lipid Panel:  Lab 11/27/11 0600  CHOL 247*  HDL 37*  LDLCALC 171*  TRIG 194*  CHOLHDL 6.7  LDLDIRECT --   Thyroid Function Tests:  Lab 11/26/11 1516  TSH 1.869  T4TOTAL --  FREET4 0.96  T3FREE --  THYROIDAB --   Urine Drug Screen: Drugs of Abuse     Component Value Date/Time   LABOPIA NONE DETECTED 11/26/2011 1937   COCAINSCRNUR NONE DETECTED 11/26/2011 1937   LABBENZ NONE DETECTED 11/26/2011 1937   AMPHETMU NONE DETECTED 11/26/2011 1937   THCU NONE DETECTED 11/26/2011 1937   LABBARB NONE DETECTED 11/26/2011 1937    Urinalysis:  Lab 11/25/11 2245  COLORURINE YELLOW  LABSPEC <1.005*  PHURINE 5.5  GLUCOSEU >1000*  HGBUR NEGATIVE  BILIRUBINUR NEGATIVE  KETONESUR NEGATIVE  PROTEINUR NEGATIVE  UROBILINOGEN 0.2  NITRITE NEGATIVE  LEUKOCYTESUR NEGATIVE   Studies/Results: Mr Maxine Glenn Head/brain Wo Cm  11/27/2011  *RADIOLOGY REPORT*  Clinical Data: CVA.  Abnormal brain MRI.  MRA HEAD WITHOUT CONTRAST  Technique: Angiographic images of the Circle of Willis were obtained using MRA technique without intravenous contrast.  Comparison: MRI brain 11/26/2011.  Findings: The study is moderately degraded by patient motion.  A second acquisition was attempted.  There is greater motion on the second to attempt.  Mild irregularity is evident through the cavernous carotid arteries bilaterally.  There is focal signal loss in the distal right A1  segment.  The A1 and M1 segments are otherwise unremarkable.  A small anterior communicating artery is patent.  The MCA bifurcations are unremarkable.  There is segmental attenuation of distal ACA and MCA branch vessels.  There is focal signal loss within the right pericallosal artery just beyond the bifurcation.  The right vertebral artery is the dominant vessel.  The left vertebral artery bifurcates at the PICA with focal signal loss in the left vertebral artery just beyond the PICA origin.  The basilar artery is somewhat irregular but without focal stenosis.  Both posterior cerebral arteries originate from basilar tip.  There is attenuation of distal PCA branch vessels.  IMPRESSION:  1.  Moderate medium and small vessel disease. 2.  At least a moderate focal stenosis in the left vertebral artery just beyond the PICA origin. 3.  No other significant proximal stenosis, aneurysm or branch vessel occlusion.  Original Report Authenticated By: Jamesetta Orleans. MATTERN, M.D.   Medications:  I have reviewed patient's medications.  Scheduled Meds:    . ipratropium  0.5 mg Nebulization Q4H   And  . albuterol  2.5 mg Nebulization Q4H  . aspirin  81 mg Oral Daily  . carvedilol  25 mg Oral BID WC  . cilostazol  100 mg Oral BID  . clopidogrel  75 mg Oral Q breakfast  . doxycycline  100 mg Oral Q12H  . enoxaparin  40 mg Subcutaneous Q24H  . furosemide  40 mg Oral BID  . gabapentin  300 mg Oral BID  . hydrALAZINE  10 mg Intravenous Once  . insulin aspart  0-15 Units Subcutaneous TID WC  . insulin aspart  20 Units Subcutaneous Once  . insulin aspart  5 Units Subcutaneous Once  . insulin glargine  40 Units Subcutaneous QHS  . isosorbide mononitrate  60 mg Oral Daily  . losartan  50 mg Oral Daily  . simvastatin  40 mg Oral QHS  . sodium chloride  3 mL Intravenous Q12H  . DISCONTD: insulin glargine  30 Units Subcutaneous QHS   Continuous Infusions:  PRN Meds:.LORazepam, sodium chloride,  traMADol Assessment/Plan:  Patient has been stable since yesterday.  She has been seen by neurology and PT.  No need for changes in current management and no PT needed.  She will be discharged today.  Stroke: Patient presented to the ED with three days of Intermittently seeing spinning flashing light in left visual field bilaterally as well as dizziness. Neuro exam normal with exception of visual symptoms and L eye ptosis that patient thinks she has had for a long time. MRI finding of likely subacute infarct in right occipital lobe correlates with visual field finding. MRA brain showed moderate medium and small vessel disease and at least a moderate focal stenosis  in the left vertebral artery just beyond PICA region.  Her dizziness and visual field symptoms had resolved a day after her admission.  Carotid dopplers showed 40~59% of stenosis on the right side and 60~79% on the left side. She had a L. carotid endarterectomy five years ago.  Future procedure on the left carotid may be considered.    HTN: Patient has chronic, poorly controlled HTN due to low medication adhesion. Her home medication plavix, Coreg, Imdur, and Losartan were continued during her stay.  Her high BP was not treated aggressively due to concerns for worsening cerebral ischemia.    COPD: Patient has had 2 weeks of cough and wheezing. Substantial wheezing on exam with poor air flow bilaterally.  In the evening of 2/2, patient had worsening SOB and wheezing.  Given concerns for a moderate COPD exacerbation, a five day doxycycline regimen, 100mg  BID,  was started for patient.   -PRN nebulizer--> changed to standing on 2/3 due to worsening of wheezing  CHF: Trace lower extremity pitting edema bilaterally and chest xray showing trace interstitial edema. Suspect systolic CHF given her history of CAD and MI. Lungs do not have crackles on exam but these may be masked by poor airflow. Her home medications, Lasix 40mg  PO BID, carvedilol, and  losartan were continued.  Echo showed EF of 30~35 with diffused hypokinesis and akinesis of inferior myocardium.    DM: High sugars on presentation likely due to her not taking her insulin for a while (per patient report). During her stay, patient has received Lantus 30 units QHS along with sliding scale insulin.  Patient's hemoglobin A1C is 15.2, indicating poorly controlled DM consistent with patient's low medication adherence.  Patient's family agree with better long term control once patient initiates care with a PCP after discharge.   Hyperlipidemia: Lipid panel showed LDL 171, HDL 37, and TG 194.  She was on Simvastatin but she has not been taking it lately.  She has been on Simvastatin 40mg  daily during her stay. She will need better long term control of cholesterol after discharge.   History of Thyroid Disease: Diagnosis uncertain. Mild proptosis present on eye exam. Patient cannot recall if she had hyperthyroid or hypothyroid, but she did have a 90% thyroidectomy many years ago.  TSH (1.869) and free T4 (0.96) were within the normal range.  TSH should be checked periodically after discharge by PCP.    DVT Prophylaxis: SubQ heparin  Full Code    LOS: 3 days   Rohen Kimes 11/28/2011, 10:58 AM

## 2011-11-28 NOTE — Progress Notes (Signed)
Stroke Team Progress Note  HISTORY [name] is an 66 y.o. female who has complained of seeing zig-zags and having vertigo and ataxia since Wednesday 11/23/2011. She is here visiting her daughter in [place]. Patient has not been compliant with her medications. She was brought into ED on Friday, 11/25/2011.   SUBJECTIVE No family is at the bedside. Overall she feels her condition is gradually improving. Does complain of right arm weakness, dropping things. Plans are for discharge today.  OBJECTIVE Filed Vitals:   11/28/11 0329 11/28/11 0512 11/28/11 0824 11/28/11 0826  BP:  163/77    Pulse:  69 80   Temp:  98.3 F (36.8 C)    TempSrc:  Oral    Resp:  20 12   Height:      Weight:  68.266 kg (150 lb 8 oz)    SpO2: 95% 93%  94%   CBG (last 3)  Basename 11/28/11 1147 11/28/11 0659 11/28/11 0254  GLUCAP 154* 140* 224*   Intake/Output from previous day: 02/03 0701 - 02/04 0700 In: 363 [P.O.:360; I.V.:3] Out: 1200 [Urine:1200]  IV Fluid Intake:    Medications    . ipratropium  0.5 mg Nebulization Q4H   And  . albuterol  2.5 mg Nebulization Q4H  . aspirin  81 mg Oral Daily  . carvedilol  25 mg Oral BID WC  . cilostazol  100 mg Oral BID  . clopidogrel  75 mg Oral Q breakfast  . doxycycline  100 mg Oral Q12H  . enoxaparin  40 mg Subcutaneous Q24H  . furosemide  40 mg Oral BID  . gabapentin  300 mg Oral BID  . hydrALAZINE  10 mg Intravenous Once  . insulin aspart  0-15 Units Subcutaneous TID WC  . insulin aspart  20 Units Subcutaneous Once  . insulin aspart  5 Units Subcutaneous Once  . insulin glargine  40 Units Subcutaneous QHS  . isosorbide mononitrate  60 mg Oral Daily  . losartan  50 mg Oral Daily  . simvastatin  40 mg Oral QHS  . sodium chloride  3 mL Intravenous Q12H  . DISCONTD: insulin glargine  30 Units Subcutaneous QHS  PRN LORazepam, sodium chloride, traMADol  Diet:  Carb Control thin liquids Activity:  Bathroom privileges VTE Prophylaxis:  Lovenox 40 mg  sq daily   Significant Diagnostic Studies: CBC    Component Value Date/Time   WBC 6.5 11/25/2011 2232   RBC 3.85* 11/25/2011 2232   HGB 11.7* 11/25/2011 2232   HCT 34.4* 11/25/2011 2232   PLT 194 11/25/2011 2232   MCV 89.4 11/25/2011 2232   MCH 30.4 11/25/2011 2232   MCHC 34.0 11/25/2011 2232   RDW 13.7 11/25/2011 2232   LYMPHSABS 1.1 11/25/2011 2232   MONOABS 0.4 11/25/2011 2232   EOSABS 0.1 11/25/2011 2232   BASOSABS 0.0 11/25/2011 2232   CMP    Component Value Date/Time   NA 139 11/28/2011 0630   K 3.4* 11/28/2011 0630   CL 103 11/28/2011 0630   CO2 28 11/28/2011 0630   GLUCOSE 121* 11/28/2011 0630   BUN 22 11/28/2011 0630   CREATININE 0.96 11/28/2011 0630   CALCIUM 9.6 11/28/2011 0630   GFRNONAA 61* 11/28/2011 0630   GFRAA 70* 11/28/2011 0630   COAGS No results found for this basename: INR, PROTIME   Lipid Panel    Component Value Date/Time   CHOL 247* 11/27/2011 0600   TRIG 194* 11/27/2011 0600   HDL 37* 11/27/2011 0600   CHOLHDL 6.7 11/27/2011  0600   VLDL 39 11/27/2011 0600   LDLCALC 171* 11/27/2011 0600   HgbA1C  Lab Results  Component Value Date   HGBA1C 15.2* 11/26/2011   Urine Drug Screen    Component Value Date/Time   LABOPIA NONE DETECTED 11/26/2011 1937   COCAINSCRNUR NONE DETECTED 11/26/2011 1937   LABBENZ NONE DETECTED 11/26/2011 1937   AMPHETMU NONE DETECTED 11/26/2011 1937   THCU NONE DETECTED 11/26/2011 1937   LABBARB NONE DETECTED 11/26/2011 1937    Alcohol Level No results found for this basename: eth   CT of the brain  Not ordered   MRI of the brain  posterior right parietal lobe  T2 hyperintensity, and enhancement.   Extensive white matter changes elsewhere likely reflect the sequelae of chronic microvascular ischemia.   MRA of the brain   1.  Moderate medium and small vessel disease. 2.  At least a moderate focal stenosis in the left vertebral artery just beyond the PICA origin. 3.  No other significant proximal stenosis, aneurysm or branch vessel occlusion.   2D Echocardiogram  EF 30-35% with  no source of embolus. Moderate diffuse hypokinesis. Akinesis of the inferior myocardium.   Carotid Doppler  Right= 40-59% internal carotid artery stenosis, lowest end of scale. Left= 40-59% internal carotid artery stenosis, highest end of scale. Bilateral antegrade vertebral flow.  CXR   11/26/2011   Moderate cardiomegaly with probable trace interstitial edema. Follow-up after presumed diuresis is recommended with PA and lateral chest radiographs.    EKG  normal sinus rhythm.   Physical Exam  Neurologic Examination: MS: AAO*3, no aphasia, follows complex commands. CN: EOMI, PERRL, no face asymmetry, right-beating nystagmus, no dysarthria, V1-V3 sensation is intact b/l Motor: no drift, 5/5 strength in all ext b/l  Sensory: no deficit to LT/PP in all ext. B/l Coord: F to N intact b/l Reflexes: trace in UE b/l, 0 reflexes in LE b/l Gait: Romberg was negative. No ataxia.   ASSESSMENT [name] is a 66 y.o. 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 Visual changes sequelae of headache/migraine variant. On aspirin 81 mg orally every day for secondary stroke prevention.  -hypertension -diabetes -hyperlipidemia -CAD (CABG, stents, MI) -carotid stenosis s/p CEA -TIA -Stroke April 2012 (slurred speech which resolved in 1-2 mo)  Hospital day # 3  TREATMENT/PLAN -Change to aspirin 325 mg orally every day for secondary stroke prevention.  -ongoing risk factor control by primary care physician -Stroke Service will sign off. Follow up with Dr. Pearlean Brownie in 2 months.  Joaquin Music, ANP-BC, GNP-BC [facility] Pager: [phone] 11/28/2011 12:18 PM  Dr. Delia Heady, Stroke Center Medical Director, has personally reviewed chart, pertinent data, examined the patient and developed the plan of care.

## 2011-11-28 NOTE — Progress Notes (Signed)
11-28-11 UR completed.

## 2011-11-28 NOTE — Discharge Summary (Signed)
[facility] Discharge Note  Name: [name] MRN: [id number] DOB: 1946-06-18 66 y.o.  Date of Admission: 11/25/2011  8:23 PM Date of Discharge: 11/28/2011 Attending Physician: Rocco Serene, MD  Discharge Diagnosis: 1. CVA 2. CHF 3. DM 4. HTN 5. Hyperlipidemia 6. COPD 7. Hx of thyroid disease  Discharge Medications: Medication List  As of 11/28/2011 11:48 AM   STOP taking these medications         exenatide 5 MCG/0.02ML Soln         TAKE these medications         albuterol 108 (90 BASE) MCG/ACT inhaler   Commonly known as: PROVENTIL HFA;VENTOLIN HFA   Inhale 2 puffs into the lungs every 6 (six) hours as needed. For shortness of breath      albuterol (5 MG/ML) 0.5% nebulizer solution   Commonly known as: PROVENTIL   Take 0.5 mLs (2.5 mg total) by nebulization 4 (four) times daily. For shortness of breath      aspirin 81 MG tablet   Take 81 mg by mouth daily.      carvedilol 25 MG tablet   Commonly known as: COREG   Take 25 mg by mouth 2 (two) times daily with a meal.      celecoxib 200 MG capsule   Commonly known as: CELEBREX   Take 200 mg by mouth 2 (two) times daily.      cilostazol 100 MG tablet   Commonly known as: PLETAL   Take 100 mg by mouth 2 (two) times daily.      clopidogrel 75 MG tablet   Commonly known as: PLAVIX   Take 75 mg by mouth daily.      furosemide 40 MG tablet   Commonly known as: LASIX   Take 40 mg by mouth 2 (two) times daily.      gabapentin 300 MG capsule   Commonly known as: NEURONTIN   Take 300 mg by mouth 2 (two) times daily.      insulin aspart 100 UNIT/ML injection   Commonly known as: novoLOG   Inject five units subcutaneously under your skin with each full meal you eat, up to three meals per day.      insulin glargine 100 UNIT/ML injection   Commonly known as: LANTUS   Inject 35 Units into the skin at bedtime.      ipratropium 0.02 % nebulizer solution   Commonly known as: ATROVENT     Take 2.5 mLs (0.5 mg total) by nebulization 4 (four) times daily.      isosorbide mononitrate 60 MG 24 hr tablet   Commonly known as: IMDUR   Take 30 mg by mouth daily.      losartan 50 MG tablet   Commonly known as: COZAAR   Take 50 mg by mouth daily.      nitroGLYCERIN 0.4 MG SL tablet   Commonly known as: NITROSTAT   Place 0.4 mg under the tongue every 5 (five) minutes as needed.      potassium chloride SA 20 MEQ tablet   Commonly known as: K-DUR,KLOR-CON   Take 1 tablet (20 mEq total) by mouth 2 (two) times daily.      simvastatin 40 MG tablet   Commonly known as: ZOCOR   Take 40 mg by mouth at bedtime.      traMADol 50 MG tablet   Commonly known as: ULTRAM   Take 50 mg by mouth every 6 (six) hours as needed.  For pain            Disposition and follow-up:   Ms.[name] was discharged from [facility] in stable condition.    Follow-up Appointments:  Patient's daughter will work on getting patient an appointment with Dr. Laurann Montana at [facility].  We have also called the facility to help arrange an appointment.    Consultations:   Dr. Lyman Speller had seen patient on 2/3 and agreed with the post-CVA management of the team.  Procedures Performed:  Mr [name] Contrast  11/27/2011  **ADDENDUM** CREATED: 11/27/2011 09:35:13  The impressions states left parietal.  The area of signal abnormality is in the posterior right parietal lobe.  **END ADDENDUM** SIGNED BY: Chauncey Fischer, M.D.    11/26/2011  *RADIOLOGY REPORT*  Clinical Data: Dizziness.  MRI HEAD WITHOUT AND WITH CONTRAST  Technique:  Multiplanar, multiecho pulse sequences of the brain and surrounding structures were obtained according to standard protocol without and with intravenous contrast  Contrast: 20mL MULTIHANCE GADOBENATE DIMEGLUMINE 529 MG/ML IV SOLN  Comparison: None.  Findings: A focal area of restricted diffusion is present in the posterior right parietal lobe.  T2 and FLAIR  hyperintensity is evident.  There is also focal cortical enhancement along the area of restricted diffusion.  There is no definite hemorrhage.  No mass lesion is evident.  Multiple patchy subcortical T2 and FLAIR hyperintensities are present bilaterally.  Confluent periventricular white matter changes evident.  Flow is present in the major intracranial arteries.  The patient is status post left lens extraction.  The globes and orbits are otherwise intact.  IMPRESSION:  1.  Left parietal area of restricted diffusion, T2 hyperintensity, and enhancement.  This likely represents a subacute infarct. Enhancement can be seen as early as 3 days. The differential diagnosis includes also a venous infarct.  Neoplasm or demyelinating process is less likely.  This appears to involve the cortex.  Short-term follow-up examination at 1-2 months would be useful to follow this lesion. 2.  Extensive white matter changes elsewhere likely reflect the sequelae of chronic microvascular ischemia.  Original Report Authenticated By: Jamesetta Orleans. MATTERN, M.D.   Dg Chest Port 1 View  11/26/2011  *RADIOLOGY REPORT*  Clinical Data: Shortness of breath, wheezing at the lung bases  PORTABLE CHEST - 1 VIEW  Comparison: None.  Findings: Moderate enlargement of the cardiomediastinal silhouette is noted with evidence of prior CABG.  Central vascular congestion is noted.  A few Kerley B lines are noted at the right and left bases.  No evidence for alveolar consolidation.  Trace if any pleural fluid is present.  No pneumothorax.  Clips project over the neck.  IMPRESSION: Moderate cardiomegaly with probable trace interstitial edema. Follow-up after presumed diuresis is recommended with PA and lateral chest radiographs.  Original Report Authenticated By: Harrel Lemon, M.D.   Mr Mra Head/brain [name] Cm  11/27/2011  *RADIOLOGY REPORT*  Clinical Data: CVA.  Abnormal brain MRI.  MRA HEAD WITHOUT CONTRAST  Technique: Angiographic images of the Circle  of Willis were obtained using MRA technique without intravenous contrast.  Comparison: MRI brain 11/26/2011.  Findings: The study is moderately degraded by patient motion.  A second acquisition was attempted.  There is greater motion on the second to attempt.  Mild irregularity is evident through the cavernous carotid arteries bilaterally.  There is focal signal loss in the distal right A1 segment.  The A1 and M1 segments are otherwise unremarkable.  A small  anterior communicating artery is patent.  The MCA bifurcations are unremarkable.  There is segmental attenuation of distal ACA and MCA branch vessels.  There is focal signal loss within the right pericallosal artery just beyond the bifurcation.  The right vertebral artery is the dominant vessel.  The left vertebral artery bifurcates at the PICA with focal signal loss in the left vertebral artery just beyond the PICA origin.  The basilar artery is somewhat irregular but without focal stenosis.  Both posterior cerebral arteries originate from basilar tip.  There is attenuation of distal PCA branch vessels.  IMPRESSION:  1.  Moderate medium and small vessel disease. 2.  At least a moderate focal stenosis in the left vertebral artery just beyond the PICA origin. 3.  No other significant proximal stenosis, aneurysm or branch vessel occlusion.  Original Report Authenticated By: Jamesetta Orleans. MATTERN, M.D.    2D Echo:   LV EF: 30% - 35%  ------------------------------------------------------------ Indications: TIA 435.9.  ------------------------------------------------------------ History: PMH: Coronary artery disease. Stroke. Transient ischemic attack. Chronic obstructive pulmonary disease. PMH: Myocardial infarction. Risk factors: PAD. Peripheral neuropathy. Hypertension. Diabetes mellitus. Dyslipidemia.  ------------------------------------------------------------ Study Conclusions  - Left ventricle: The cavity size was normal. There was  mild concentric hypertrophy. Systolic function was moderately to severely reduced. The estimated ejection fraction was in the range of 30% to 35%. Moderate diffuse hypokinesis. Akinesis of the inferior myocardium. Doppler parameters are consistent with abnormal left ventricular relaxation (grade 1 diastolic dysfunction). - Mitral valve: Moderately calcified annulus. - Left atrium: The atrium was mildly to moderately dilated.  ------------------------------------------------------------ Labs, prior tests, procedures, and surgery: Coronary artery bypass grafting.  Transthoracic echocardiography. M-mode, complete 2D, spectral Doppler, and color Doppler. Height: Height: 157.5cm. Height: 62in. Weight: Weight: 74.4kg. Weight: 163.7lb. Body mass index: BMI: 30kg/m^2. Body surface area: BSA: 1.37m^2. Blood pressure: 166/77. Patient status: Inpatient. Location: Bedside.  ------------------------------------------------------------  ------------------------------------------------------------ Left ventricle: The cavity size was normal. There was mild concentric hypertrophy. Systolic function was moderately to severely reduced. The estimated ejection fraction was in the range of 30% to 35%. Moderate diffuse hypokinesis. Regional wall motion abnormalities: Akinesis of the inferior myocardium. Doppler parameters are consistent with abnormal left ventricular relaxation (grade 1 diastolic dysfunction).  ------------------------------------------------------------ Aortic valve: Trileaflet; normal thickness, mildly calcified leaflets. Mobility was not restricted. Doppler: Transvalvular velocity was within the normal range. There was no stenosis. No regurgitation.  ------------------------------------------------------------ Aorta: Aortic root: The aortic root was normal in size.  ------------------------------------------------------------ Mitral valve: Moderately calcified annulus.  Mobility was not restricted. Doppler: Transvalvular velocity was within the normal range. There was no evidence for stenosis. No regurgitation.  ------------------------------------------------------------ Left atrium: The atrium was mildly to moderately dilated.  ------------------------------------------------------------ Atrial septum: The interatrial septum was hypermobile.  ------------------------------------------------------------ Right ventricle: The cavity size was normal. Wall thickness was normal. Systolic function was normal.  ------------------------------------------------------------ Pulmonic valve: Doppler: Transvalvular velocity was within the normal range. There was no evidence for stenosis.  ------------------------------------------------------------ Tricuspid valve: Structurally normal valve. Doppler: Transvalvular velocity was within the normal range. No regurgitation.  ------------------------------------------------------------ Right atrium: The atrium was normal in size.  ------------------------------------------------------------ Pericardium: There was no pericardial effusion.  ------------------------------------------------------------ Systemic veins: Inferior vena cava: The vessel was dilated.  ------------------------------------------------------------  2D measurements Normal Doppler Normal Left ventricle measurements LVID ED, 47.2 mm 43-52 Left ventricle chord, Ea, lat 4.83 cm/ ------- PLAX ann, tiss s LVID ES, 38.4 mm 23-38 DP chord, E/Ea, lat 14.53 ------- PLAX ann, tiss FS, chord, 19 % >29 DP PLAX Ea, med 3.84  cm/ ------- LVPW, ED 12.2 mm ------ ann, tiss s IVS/LVPW 1.4 <1.3 DP ratio, ED E/Ea, med 18.28 ------- Ventricular septum ann, tiss IVS, ED 17.1 mm ------ DP Aorta Mitral valve Root diam, 36 mm ------ Peak E vel 70.2 cm/ ------- ED s Left atrium Peak A vel 141 cm/ ------- AP dim 40 mm ------ s AP dim 2.27 cm/m^2 <2.2  Deceleratio 120 ms 150-230 index n time Vol, S 65 ml ------ Peak E/A 0.5 ------- Vol index, 36.9 ml/m^2 ------ ratio S Systemic veins Estimated 10 mm ------- CVP Hg Right ventricle Sa vel, lat 8.77 cm/ ------- ann, tiss s DP  ------------------------------------------------------------ Prepared and Electronically Authenticated by  Georga Hacking, MD [facility] [id number]  Admission HPI:   Ms. Seib is a 66 yo AAF with history of stroke, HF, DM, HTN, and hypothyroidism brought to the ED by her daughter for dizziness, vision changes, and problem walking. She has recently moved to the area from [place] to live with her daughter. In the past three days, she had complained of dizziness, right sided head headache, and seeing a spinning fan in her left visual field when she stands up. Patient later described it as a flashing light that persists with standing. Patient and her daughter denied slurred speech, blurry vision, worsening of her baseline L eyelid ptosis, and dysphagia. However, patient's daughter had noticed that patient tends to deviate to the left side when she ambulates. Patient has poor circulation to her left foot for the past 6 months and had ambulated only minimally around the house. She has also had chest congestion and wheezing in the past two weeks. She denied rhinitis and sore throat. She uses albuterol nebulizer and inhaler chronically for COPD. Patient has heart failure for eighteen years, status post angioplasty, double bypass, and stents. Her last cath was done three months ago. She also had a heart attack more than ten years ago. She also has loose bowel movements with food intake. She denied abdominal pain, constipation, melena, and hematochezia.  She has history of TIA vs. Stroke in April 2012. She had residual speech slurring requiring speech therapy for a month or two that resolved with therapy. No other residual deficits.   Hospital Course by problem  list:  Stroke: Patient presented to the ED with three days of Intermittently seeing spinning flashing light in left visual field bilaterally as well as dizziness. Neuro exam normal with exception of visual symptoms and L eye ptosis that patient thinks she has had for a long time. MRI finding of likely subacute infarct in right occipital lobe correlates with visual field finding. MRA brain showed moderate medium and small vessel disease and at least a moderate focal stenosis in the left vertebral artery just beyond PICA region. Her dizziness and visual field symptoms had resolved a day after her admission. Carotid dopplers showed 40~59% of stenosis on the right side and 60~79% on the left side. She had a L. carotid endarterectomy five years ago. Future procedure on the left carotid may be considered. Patient has been stable since yesterday. She has been seen by neurology and PT. Dr. Lyman Speller agreed with our plan and patient does not need outpatient PT.  Continue ASA 81mg  and plavix on discharge.  Needs better management of risk factors (DM, HTN, HLD) as per below  HTN: Patient has chronic, poorly controlled HTN due to low medication adhesion. Her home medication plavix, Coreg, Imdur, and Losartan were continued during her stay. SBP was about 160s on discharge.  Since these medications had not had time to reach definite steady state effect, and since we do not need to lower her all the way to normal acutely as she has likely been very high BP for a long time, will keep this regimen for now.  BP and regimen should be addressed by PCP on fu.    COPD: Patient has had 2 weeks of cough and wheezing. Substantial wheezing on exam with poor air flow bilaterally. In the evening of 2/2, patient had worsening SOB and wheezing. Was given one dose of prednisone in hospital and several doses of doxycycline, but on discharge these were not continued as her symptoms were not very severe.  Discharge on scheduled QID duoneb  nebulizers as she cannot afford maintenance inhalers for her COPD. This regimen should be addressed on PCP fu.  CHF: Trace lower extremity pitting edema bilaterally and chest xray showing trace interstitial edema.  Lungs did not have crackles on exam throughout hospitalization. Her home medications, Lasix 40mg  PO BID, carvedilol, and losartan were continued. Echo showed EF of 30~35 with diffused hypokinesis and akinesis of inferior myocardium.   DM: High sugars on presentation likely due to her not taking her insulin for a while (per patient report). During her stay, patient has received Lantus QHS along with sliding scale insulin. Patient's hemoglobin A1C is 15.2, indicating poorly controlled DM consistent with patient's low medication adherence. Patient's family agree with better long term control once patient initiates care with a PCP after discharge. Discharged on Lantus 35 units QHS and novolog 5 units with each full meal she eats (does not consistently eat three full meals per day).    Hyperlipidemia: Lipid panel showed LDL 171, HDL 37, and TG 194. She was on Simvastatin but she has not been taking it lately. She has been on Simvastatin 40mg  daily during her stay. She will need better long term control of cholesterol after discharge. Simvastatin continued on discharge.    History of Thyroid Disease: Diagnosis uncertain. Mild proptosis present on eye exam. Patient cannot recall if she had hyperthyroid or hypothyroid, but she did have a 90% thyroidectomy many years ago. TSH (1.869) and free T4 (0.96) were within the normal range. TSH may need to be checked periodically after discharge by PCP.    Discharge Vitals:  BP 163/77  Pulse 80  Temp(Src) 98.3 F (36.8 C) (Oral)  Resp 12  Ht 5\' 2"  (1.575 m)  Wt 68.266 kg (150 lb 8 oz)  BMI 27.53 kg/m2  SpO2 94%  Discharge Labs:  Results for orders placed during the hospital encounter of 11/25/11 (from the past 24 hour(s))  GLUCOSE, CAPILLARY      Status: Abnormal   Collection Time   11/27/11 11:53 AM      Component Value Range   Glucose-Capillary 506 (*) 70 - 99 (mg/dL)   Comment 1 Repeat Test    GLUCOSE, CAPILLARY     Status: Abnormal   Collection Time   11/27/11 11:54 AM      Component Value Range   Glucose-Capillary 448 (*) 70 - 99 (mg/dL)  GLUCOSE, CAPILLARY     Status: Abnormal   Collection Time   11/27/11  4:34 PM      Component Value Range   Glucose-Capillary 491 (*) 70 - 99 (mg/dL)  GLUCOSE, CAPILLARY     Status: Abnormal   Collection Time   11/27/11  9:21 PM      Component Value Range   Glucose-Capillary 526 (*) 70 -  99 (mg/dL)   Comment 1 Documented in Chart     Comment 2 Notify RN    GLUCOSE, CAPILLARY     Status: Abnormal   Collection Time   11/27/11 11:39 PM      Component Value Range   Glucose-Capillary 403 (*) 70 - 99 (mg/dL)  GLUCOSE, CAPILLARY     Status: Abnormal   Collection Time   11/28/11  2:54 AM      Component Value Range   Glucose-Capillary 224 (*) 70 - 99 (mg/dL)   Comment 1 Notify RN    BASIC METABOLIC PANEL     Status: Abnormal   Collection Time   11/28/11  6:30 AM      Component Value Range   Sodium 139  135 - 145 (mEq/L)   Potassium 3.4 (*) 3.5 - 5.1 (mEq/L)   Chloride 103  96 - 112 (mEq/L)   CO2 28  19 - 32 (mEq/L)   Glucose, Bld 121 (*) 70 - 99 (mg/dL)   BUN 22  6 - 23 (mg/dL)   Creatinine, Ser 7.82  0.50 - 1.10 (mg/dL)   Calcium 9.6  8.4 - 95.6 (mg/dL)   GFR calc non Af Amer 61 (*) >90 (mL/min)   GFR calc Af Amer 70 (*) >90 (mL/min)  GLUCOSE, CAPILLARY     Status: Abnormal   Collection Time   11/28/11  6:59 AM      Component Value Range   Glucose-Capillary 140 (*) 70 - 99 (mg/dL)    Signed: Maren Beach 11/24/3084, 11:27 AM   I have read the entire above note and made edits as needed. Yaakov Guthrie, MD

## 2011-11-28 NOTE — Progress Notes (Signed)
S: Stroke symptoms have been resolved for two days now.  Walked down hallway yest with no dizzyness or visual symptoms.  Has been hyperglycemic here despite increasing her lantus to 40 units last night.  Exam:  Agree with excellent MS3 physical exam notation   A/P:  Agree with excellent MS3 A/P  Stroke: Likely due to poorly controlled risk factors.  May need intervention for L carotid in future, can be addressed by her outpatient PCP.  Better HTN, HLD, DM control as per below.    HTN: BPs in 160s systolic, but just was restarted on home regimen as she had not been taking them.  To early to establish her baseline on these meds, and no need to lower her BP all the way to normal range immediately.  Discharge on current regimen and her new PCP can adjust these at follow-up.  COPD: Discharge on scheduled nebulizers since she cannot afford maintenance inhaler.  Will not continue prednisone or doxycycline on discharge.  CHF: Lasic 40mg  PO BID on discharge.  Will add potassium supplementation as K has fallen and she has normal renal function.  DM:  Will discharge with meal time coverage 5 units novolog 30 minutes before each meal she eats.  Also 35 units lantus QHS.   HLD: Restart zocor since she has had problems with crestor and lipitor in past.

## 2011-11-28 NOTE — Progress Notes (Signed)
OT Discharge Note  Patient is being discharged from OT services secondary to:    Goals met and no further therapy needs identified.  Please see latest Therapy Progress Note for current level of functioning and progress toward goals.  Progress and discharge plan and discussed with patient/caregiver and they    Agree  RN EVEA informed of pt with headache and providing medication.  Harrel Carina Cordova   OTR/L Pager: [phone] Office: [phone] .

## 2011-11-29 NOTE — Progress Notes (Signed)
Late entry: noted referral from diabetes coor, Lenor Coffin, and note reflects that pt has been assisted with education, some meds and followup education as well as possible followup with [facility]. No other needs noted. Pt has insurance so daughter and /or pt will be able to schedule a PCP for this pt.  Johny Shock RN MPH Case Manager [phone]

## 2011-11-30 NOTE — Progress Notes (Signed)
Contacted pt pcp (Dr Laurann Montana) to inform her of this referral.  Pt will receive a transition of care call to discuss [facility] Care Management services.

## 2011-12-01 ENCOUNTER — Emergency Department (HOSPITAL_COMMUNITY): Payer: No Typology Code available for payment source

## 2011-12-01 ENCOUNTER — Observation Stay (HOSPITAL_COMMUNITY)
Admission: EM | Admit: 2011-12-01 | Discharge: 2011-12-03 | Disposition: A | Discharge status: Home / Self Care | Payer: No Typology Code available for payment source | Attending: Internal Medicine | Admitting: Internal Medicine

## 2011-12-01 ENCOUNTER — Encounter (HOSPITAL_COMMUNITY): Payer: Self-pay | Admitting: *Deleted

## 2011-12-01 DIAGNOSIS — I509 Heart failure, unspecified: Secondary | ICD-10-CM | POA: Insufficient documentation

## 2011-12-01 DIAGNOSIS — J4489 Other specified chronic obstructive pulmonary disease: Secondary | ICD-10-CM | POA: Insufficient documentation

## 2011-12-01 DIAGNOSIS — I5022 Chronic systolic (congestive) heart failure: Secondary | ICD-10-CM | POA: Insufficient documentation

## 2011-12-01 DIAGNOSIS — I2510 Atherosclerotic heart disease of native coronary artery without angina pectoris: Secondary | ICD-10-CM | POA: Insufficient documentation

## 2011-12-01 DIAGNOSIS — I10 Essential (primary) hypertension: Secondary | ICD-10-CM | POA: Insufficient documentation

## 2011-12-01 DIAGNOSIS — E1149 Type 2 diabetes mellitus with other diabetic neurological complication: Secondary | ICD-10-CM | POA: Insufficient documentation

## 2011-12-01 DIAGNOSIS — J449 Chronic obstructive pulmonary disease, unspecified: Secondary | ICD-10-CM | POA: Diagnosis present

## 2011-12-01 DIAGNOSIS — I5042 Chronic combined systolic (congestive) and diastolic (congestive) heart failure: Secondary | ICD-10-CM | POA: Diagnosis present

## 2011-12-01 DIAGNOSIS — R748 Abnormal levels of other serum enzymes: Secondary | ICD-10-CM

## 2011-12-01 DIAGNOSIS — E785 Hyperlipidemia, unspecified: Secondary | ICD-10-CM | POA: Insufficient documentation

## 2011-12-01 DIAGNOSIS — I252 Old myocardial infarction: Secondary | ICD-10-CM

## 2011-12-01 DIAGNOSIS — E1165 Type 2 diabetes mellitus with hyperglycemia: Secondary | ICD-10-CM | POA: Diagnosis present

## 2011-12-01 DIAGNOSIS — R0602 Shortness of breath: Principal | ICD-10-CM | POA: Insufficient documentation

## 2011-12-01 DIAGNOSIS — E1142 Type 2 diabetes mellitus with diabetic polyneuropathy: Secondary | ICD-10-CM | POA: Insufficient documentation

## 2011-12-01 DIAGNOSIS — Z951 Presence of aortocoronary bypass graft: Secondary | ICD-10-CM

## 2011-12-01 HISTORY — DX: Hypothyroidism, unspecified: E03.9

## 2011-12-01 HISTORY — DX: Cerebral infarction, unspecified: I63.9

## 2011-12-01 LAB — POCT I-STAT 3, VENOUS BLOOD GAS (G3P V)
Acid-Base Excess: 2 mmol/L (ref 0.0–2.0)
Bicarbonate: 29.1 mEq/L — ABNORMAL HIGH (ref 20.0–24.0)
TCO2: 31 mmol/L (ref 0–100)
pO2, Ven: 32 mmHg (ref 30.0–45.0)

## 2011-12-01 LAB — CBC
MCV: 93.4 fL (ref 78.0–100.0)
Platelets: 225 10*3/uL (ref 150–400)
RBC: 3.93 MIL/uL (ref 3.87–5.11)
RDW: 14.6 % (ref 11.5–15.5)
WBC: 6.9 10*3/uL (ref 4.0–10.5)

## 2011-12-01 LAB — COMPREHENSIVE METABOLIC PANEL
ALT: 14 U/L (ref 0–35)
AST: 16 U/L (ref 0–37)
Albumin: 3.2 g/dL — ABNORMAL LOW (ref 3.5–5.2)
CO2: 30 mEq/L (ref 19–32)
Chloride: 103 mEq/L (ref 96–112)
Creatinine, Ser: 0.91 mg/dL (ref 0.50–1.10)
GFR calc non Af Amer: 65 mL/min — ABNORMAL LOW (ref >90)
Potassium: 4.6 mEq/L (ref 3.5–5.1)
Sodium: 141 mEq/L (ref 135–145)
Total Bilirubin: 0.2 mg/dL — ABNORMAL LOW (ref 0.3–1.2)

## 2011-12-01 LAB — PRO B NATRIURETIC PEPTIDE: Pro B Natriuretic peptide (BNP): 2407 pg/mL — ABNORMAL HIGH (ref 0–125)

## 2011-12-01 MED ORDER — ALBUTEROL SULFATE (5 MG/ML) 0.5% IN NEBU
INHALATION_SOLUTION | RESPIRATORY_TRACT | Status: AC
  Administered 2011-12-01: 21:00:00
  Filled 2011-12-01: qty 1

## 2011-12-01 MED ORDER — METHYLPREDNISOLONE SODIUM SUCC 125 MG IJ SOLR
125.0000 mg | INTRAMUSCULAR | Status: AC
  Administered 2011-12-01: 125 mg via INTRAVENOUS
  Filled 2011-12-01: qty 2

## 2011-12-01 MED ORDER — IPRATROPIUM BROMIDE 0.02 % IN SOLN
RESPIRATORY_TRACT | Status: AC
  Administered 2011-12-01: 21:00:00
  Filled 2011-12-01: qty 2.5

## 2011-12-01 MED ORDER — ALBUTEROL SULFATE (5 MG/ML) 0.5% IN NEBU
2.5000 mg | INHALATION_SOLUTION | RESPIRATORY_TRACT | Status: AC
  Administered 2011-12-01: 2.5 mg via RESPIRATORY_TRACT
  Filled 2011-12-01: qty 0.5

## 2011-12-01 NOTE — H&P (Signed)
Hospital Admission Note Date: 12/01/2011  Patient name: Kalley Nicholl Medical record number: [id number] Date of birth: 1946/03/18 Age: 66 y.o. Gender: female PCP: No primary provider on file.  Medical Service: [facility]  Attending physician: Dr. Doneen Poisson     1st Contact: Dr. Blanca Friend    Pager: [phone] 2nd Contact: Dr. Bard Herbert    Pager: [phone]  After 5 pm or weekends: 1st Contact:      Pager: [phone] 2nd Contact:      Pager: [phone]  Chief Complaint: Shortness of breath  History of Present Illness: This is a 66 year old female with PMH of COPD, DM 2, CAD with CHF and MI, PAD and recent hospitalization for CVA  who presents with shortness breath. Patient states that she started to notice progressively worsening of shortness breath accompanied with wheezing since her discharge from hospital on 11/28/11. The shortness of breath is intermittent, gradual onset, worsening with exertion and partially relieved by home nebulizer regimen. The EMS has gone to patient's house nightly to administer nebulizer treatments as well. Patient reports that she had acute onset shortness breath worse than before at 7 PM tonight While she's eating dinner, and she called EMS again and received duo neb treatment x2 without any improvement. She was brought into [facility] for further evaluation.   Denies headache, fever, or sore throat. Denies chest pain, chest pressure or palpitation. Denies nausea, vomiting, or abdominal pain. Denies melena, diarrhea or incontinence. No muscle weakness. Denies depression. No appetite or weight changes.   Of note, patient was discharged from [facility] hospital 3 days ago following an admission for CVA. She notes that she has been compliant with her medications, and beyond the CVA she has been in her usual state of health  Meds: Medications Prior to Admission  Medication Dose Route Frequency Provider Last Rate Last Dose  . albuterol  (PROVENTIL) (5 MG/ML) 0.5% nebulizer solution 2.5 mg  2.5 mg Nebulization STAT Gerhard Munch, MD   2.5 mg at 12/01/11 2155  . albuterol (PROVENTIL) (5 MG/ML) 0.5% nebulizer solution           . ipratropium (ATROVENT) 0.02 % nebulizer solution           . methylPREDNISolone sodium succinate (SOLU-MEDROL) 125 MG injection 125 mg  125 mg Intravenous STAT Gerhard Munch, MD   125 mg at 12/01/11 2152   Medications Prior to Admission  Medication Sig Dispense Refill  . albuterol (PROVENTIL) (5 MG/ML) 0.5% nebulizer solution Take 2.5 mg by nebulization 4 (four) times daily.      . Blood Glucose Monitoring Suppl W/DEVICE KIT Use to check blood sugar 2 times daily  1 each  0  . carvedilol (COREG) 25 MG tablet Take 25 mg by mouth 2 (two) times daily with a meal.      . clopidogrel (PLAVIX) 75 MG tablet Take 75 mg by mouth daily.      . furosemide (LASIX) 40 MG tablet Take 40 mg by mouth 2 (two) times daily.      . insulin aspart (NOVOLOG) 100 UNIT/ML injection Inject 5 Units into the skin 3 (three) times daily before meals.      . insulin glargine (LANTUS) 100 UNIT/ML injection Inject 35 Units into the skin at bedtime.      Marland Kitchen ipratropium (ATROVENT) 0.02 % nebulizer solution Take 0.5 mg by nebulization 4 (four) times daily.      . isosorbide mononitrate (IMDUR) 60 MG 24 hr tablet Take  30 mg by mouth every morning.       Marland Kitchen losartan (COZAAR) 50 MG tablet Take 50 mg by mouth daily.      . mometasone (NASONEX) 50 MCG/ACT nasal spray Place 2 sprays into the nose at bedtime.      . nitroGLYCERIN (NITROSTAT) 0.4 MG SL tablet Place 0.4 mg under the tongue every 5 (five) minutes as needed. For chest pain      . potassium chloride SA (K-DUR,KLOR-CON) 20 MEQ tablet Take 20 mEq by mouth 2 (two) times daily.      . simvastatin (ZOCOR) 40 MG tablet Take 40 mg by mouth at bedtime.      . traMADol (ULTRAM) 50 MG tablet Take 50 mg by mouth every 6 (six) hours as needed. For pain        Allergies: Crestor; Lisinopril;  and Vicodin Past Medical History  Diagnosis Date  . Diabetes mellitus     on Lantus 30 U   . MI (myocardial infarction)   . CAD (coronary artery disease)   . Heart failure     [place], [place] previous records.   . S/P CABG (coronary artery bypass graft)   . PAD (peripheral artery disease)   . Stroke    Past Surgical History  Procedure Date  . Ptca   . Thyroidectomy   . Coronary artery bypass graft     2 valves  . Carotid endarterectomy   . Cholecystectomy, laparoscopic less than 6 months ago   History reviewed. No pertinent family history. History   Social History  . Marital Status: Single    Spouse Name: N/A    Number of Children: N/A  . Years of Education: N/A   Occupational History  . Not on file.   Social History Main Topics  . Smoking status: Former Smoker -- 1.0 packs/day for 50 years    Types: Cigarettes    Quit date: 11/25/2010  . Smokeless tobacco: Never Used  . Alcohol Use: No  . Drug Use: No  . Sexually Active: No   Other Topics Concern  . Not on file   Social History Narrative   Retired Advertising account executive.  Worked at the [employer] on [employer].  Formerly lived in [place] area.  2 daughters, Living here with her daughter in [place] starting end of January 2013.    Review of Systems: See above HPI  Physical Exam: Blood pressure 170/86, pulse 81, temperature 98.2 F (36.8 C), temperature source Oral, resp. rate 18, height 5\' 1"  (1.549 m), weight 150 lb (68.04 kg), SpO2 100.00%. General: alert, well-developed, and cooperative to examination.  Head: normocephalic and atraumatic.  Eyes: vision grossly intact, pupils equal, pupils round, pupils reactive to light, no injection and anicteric.  Mouth: pharynx pink and moist, no erythema, and no exudates.  Neck: supple, full ROM, no thyromegaly, no JVD, and no carotid bruits.  Lungs: normal respiratory effort, no accessory muscle use, coarse breath sounds, no crackles, and no wheezes. Heart:  normal rate, regular rhythm, no murmur, no gallop, and no rub.  Abdomen: soft, non-tender, normal bowel sounds, no distention, no guarding, no rebound tenderness, no hepatomegaly, and no splenomegaly.  Msk: no joint swelling, no joint warmth, and no redness over joints.  Pulses: 2+ DP/PT pulses bilaterally Extremities: No cyanosis, clubbing, edema Neurologic: alert & oriented X3, cranial nerves II-XII intact, strength normal in all extremities, sensation intact to light touch, and gait normal.  Skin: turgor normal and no rashes.  Psych: Oriented X3,  memory intact for recent and remote, normally interactive, good eye contact, not anxious appearing, and not depressed appearing.  Lab results: Basic Metabolic Panel:  [facility] 12/01/11 2125  Machelle Raybon 141  K 4.6  CL 103  CO2 30  GLUCOSE 423*  BUN 17  CREATININE 0.91  CALCIUM 9.4  MG --  PHOS --   Liver Function Tests:  Basename 12/01/11 2125  AST 16  ALT 14  ALKPHOS 115  BILITOT 0.2*  PROT 7.1  ALBUMIN 3.2*   CBC:  Basename 12/01/11 2125  WBC 6.9  NEUTROABS --  HGB 11.7*  HCT 36.7  MCV 93.4  PLT 225   Cardiac Enzymes:  Basename 12/01/11 2124  CKTOTAL --  CKMB --  CKMBINDEX --  TROPONINI 0.39*   BNP:  Basename 12/01/11 2130  PROBNP 2407.0*   Urine Drug Screen: Drugs of Abuse     Component Value Date/Time   LABOPIA NONE DETECTED 11/26/2011 1937   COCAINSCRNUR NONE DETECTED 11/26/2011 1937   LABBENZ NONE DETECTED 11/26/2011 1937   AMPHETMU NONE DETECTED 11/26/2011 1937   THCU NONE DETECTED 11/26/2011 1937   LABBARB NONE DETECTED 11/26/2011 1937    Imaging results:  Dg Chest 2 View  12/01/2011  *RADIOLOGY REPORT*  Clinical Data: Hypertension, diabetes, shortness of breath  CHEST - 2 VIEW  Comparison: 11/26/2011  Findings: Previous median sternotomy.  Stable moderate cardiomegaly.  Perihilar and bibasilar interstitial edema or infiltrates, perhaps marginally increased since previous exam.  No effusion however.  Mild  spondylitic changes in the lower thoracic spine.  IMPRESSION:  1.  Persistent cardiomegaly with slight progression of interstitial edema or infiltrates.  Original Report Authenticated By: Osa Craver, M.D.    Other results: EKG:   Assessment & Plan by Problem:  # Shortness of breath: CHF vs COPD    This patient presents with progressive SOB and dyspnea on exertion with no chest pain or chest pressure. No recent sick contact or travel history.  Influenza or PNA is unlikely since she has been afebrile, no leukocytosis and no cold like symptoms, and  CXR rules out obvious etiologies including PNA. Her modified Geneva score is 3 which indicates low probability for PE. Given her extensive history of CAD with CHF (EF 35% ) and mild elevation of troponin of 0.39, cardiac source can not be completely ruled out, including CHF vs ischemia.  Also she has had poorly controlled COPD with long term smoking quitted ;ast April, and we will also treat her pulmonary disease.    Plan: -Will admit to TELE -Cycle CE x3, and will consider to call cardiology consult if troponin trending up.  -12 lead EKG in AM. -continue home plavix, BBlocker, Full dose ASA, Statin, Imdur, nitroglycerin and ARB.  -continue Neb treatment.( albuterol and Atrovent).  - will start Advair inhaler given her poorly controlled COPD.   # Chronic systolic CHF: Pro-BNP on admission 2407 and we do not have any previous baseline BNP to comparing to.  Last 2d Echo on 11/27/11 showed EF of 30% to 35% with moderate diffuse hypokinesis. Akinesis of the inferior myocardium.  CXR findings today are also consistent with mild vascular congestion, which is not significantly changed form previous CXR.  pt reports compliance to her meds. No sign of CHF exacerbation.   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   # DM,  last Hb  A1C 15.2 on 11/26/11  Plan: -CBG -SSI -lantus  # Hx of thyroid disease, not on medical treatment. normal workup on 11/26/11.  # VTE prophylaxis: Heparin    Signed: Abdirahim Flavell 12/01/2011, 11:12 PM

## 2011-12-01 NOTE — ED Notes (Signed)
MD at bedside. 

## 2011-12-01 NOTE — ED Provider Notes (Signed)
History     CSN: [id number]  Arrival date & time 12/01/11  2048   First MD Initiated Contact with Patient 12/01/11 2050      Chief Complaint  Patient presents with  . Shortness of Breath     HPI Patient presents with dyspnea.  She notes that she has a history of COPD, as well as CHF.  She was discharged from this facility 4 days ago following an admission for CVA.  She notes that since that discharge she has had persistent dyspnea.  This has been persistent in spite of multiple nebulizer treatments.  EMS has gone to the patient's house on each of the last 3 nights to administer treatment.  Joint she noticed her breathing was worse, spontaneously.  No associated chest pain, no syncope, no nausea, vomiting, no abdominal pain. She notes that she has been compliant with her medications, and beyond the CVA she has been in her usual state of health  Past Medical History  Diagnosis Date  . Diabetes mellitus     on Lantus 30 U   . MI (myocardial infarction)   . CAD (coronary artery disease)   . Heart failure     [place], [place] previous records.   . S/P CABG (coronary artery bypass graft)   . PAD (peripheral artery disease)   . Stroke     Past Surgical History  Procedure Date  . Ptca   . Thyroidectomy   . Coronary artery bypass graft     2 valves  . Carotid endarterectomy   . Cholecystectomy, laparoscopic less than 6 months ago    History reviewed. No pertinent family history.  History  Substance Use Topics  . Smoking status: Former Smoker -- 1.0 packs/day for 50 years    Types: Cigarettes    Quit date: 11/25/2010  . Smokeless tobacco: Never Used  . Alcohol Use: No    OB History    Grav Para Term Preterm Abortions TAB SAB Ect Mult Living                  Review of Systems  Constitutional:       HPI  HENT:       HPI otherwise negative  Eyes: Negative.   Respiratory:       HPI, otherwise negative  Cardiovascular:       HPI, otherwise nmegative  Gastrointestinal:  Negative for vomiting.  Genitourinary:       HPI, otherwise negative  Musculoskeletal:       HPI, otherwise negative  Skin: Negative.   Neurological: Negative for syncope.    Allergies  Crestor; Lisinopril; and Vicodin  Home Medications   Current Outpatient Rx  Name Route Sig Dispense Refill  . ACETAMINOPHEN 500 MG PO TABS Oral Take 500 mg by mouth every 6 (six) hours as needed. For pain    . ALBUTEROL SULFATE HFA 108 (90 BASE) MCG/ACT IN AERS Inhalation Inhale 2 puffs into the lungs every 6 (six) hours as needed. For wheezing    . ALBUTEROL SULFATE (5 MG/ML) 0.5% IN NEBU Nebulization Take 2.5 mg by nebulization 4 (four) times daily.    . ASPIRIN EC 81 MG PO TBEC Oral Take 81 mg by mouth daily.    Marland Kitchen BLOOD GLUCOSE MONITORING SUPPL W/DEVICE KIT  Use to check blood sugar 2 times daily 1 each 0  . CARVEDILOL 25 MG PO TABS Oral Take 25 mg by mouth 2 (two) times daily with a meal.    .  CLOPIDOGREL BISULFATE 75 MG PO TABS Oral Take 75 mg by mouth daily.    . FUROSEMIDE 40 MG PO TABS Oral Take 40 mg by mouth 2 (two) times daily.    . INSULIN ASPART 100 UNIT/ML [place] SOLN Subcutaneous Inject 5 Units into the skin 3 (three) times daily before meals.    . INSULIN GLARGINE 100 UNIT/ML [place] SOLN Subcutaneous Inject 35 Units into the skin at bedtime.    . IPRATROPIUM BROMIDE 0.02 % IN SOLN Nebulization Take 0.5 mg by nebulization 4 (four) times daily.    . ISOSORBIDE MONONITRATE ER 60 MG PO TB24 Oral Take 30 mg by mouth every morning.     Marland Kitchen LOSARTAN POTASSIUM 50 MG PO TABS Oral Take 50 mg by mouth daily.    . MOMETASONE FUROATE 50 MCG/ACT NA SUSP Nasal Place 2 sprays into the nose at bedtime.    Marland Kitchen NITROGLYCERIN 0.4 MG SL SUBL Sublingual Place 0.4 mg under the tongue every 5 (five) minutes as needed. For chest pain    . POTASSIUM CHLORIDE CRYS ER 20 MEQ PO TBCR Oral Take 20 mEq by mouth 2 (two) times daily.    Marland Kitchen SIMVASTATIN 40 MG PO TABS Oral Take 40 mg by mouth at bedtime.    . TRAMADOL HCL 50 MG PO  TABS Oral Take 50 mg by mouth every 6 (six) hours as needed. For pain      BP 205/95  Pulse 90  Temp(Src) 98.2 F (36.8 C) (Oral)  Resp 20  Ht 5\' 1"  (1.549 m)  Wt 150 lb (68.04 kg)  BMI 28.34 kg/m2  SpO2 100%  Physical Exam  Nursing note and vitals reviewed. Constitutional: She is oriented to person, place, and time. She appears well-developed and well-nourished. No distress.  HENT:  Head: Normocephalic and atraumatic.  Eyes: Conjunctivae and EOM are normal.  Cardiovascular: Normal rate and regular rhythm.   Pulmonary/Chest: Effort normal and breath sounds normal. No stridor. No respiratory distress.  Abdominal: She exhibits no distension.  Musculoskeletal: She exhibits no edema.  Neurological: She is alert and oriented to person, place, and time. No cranial nerve deficit.  Skin: Skin is warm and dry.  Psychiatric: She has a normal mood and affect.    ED Course  Procedures (including critical care time)   Labs Reviewed  CBC  COMPREHENSIVE METABOLIC PANEL  TROPONIN I  PRO B NATRIURETIC PEPTIDE  BLOOD GAS, VENOUS   No results found.   No diagnosis found.  Cardiac monitor 90, sinus rhythm, normal Pulse oximetry 100%, nasal cannula, abnormal  Date: 12/01/2011  Rate: 84  Rhythm: normal sinus rhythm  QRS Axis: left  Intervals: normal  ST/T Wave abnormalities: nonspecific T wave changes  Conduction Disutrbances:none  Narrative Interpretation:   Old EKG Reviewed: none available ABNORMAL  ECG  CXR, reviewed by me   MDM  This elderly female with multiple medical problems, recently evaluated following a stroke now presents with dyspnea.  On exam the patient is in no distress, though she has notable hypertension.  Given the patient's history of COPD, and CHF, these are likely causative, though with the hypertension there is concern for pulmonary edema as well.  The patient achieved some relief with steroids, albuterol nebulizer.  The patient's labs were notable for an  elevated troponin, elevated BNP.  She received Lasix.  The patient was admitted for further evaluation and management        Gerhard Munch, MD 12/01/11 [id number]

## 2011-12-01 NOTE — ED Notes (Signed)
Patient was brought in by EMS for Shortness of Breath, duo-nebs x 2 given at residents home but still complains of shortness of breathe.  EMS stated that had been going to the home nightly to advise nebs to patient for same.  Discharged from here on Monday with a CVA.

## 2011-12-01 NOTE — ED Notes (Signed)
EKG was done and given to Dr. Jeraldine Loots.

## 2011-12-02 ENCOUNTER — Encounter (HOSPITAL_COMMUNITY): Payer: Self-pay | Admitting: Nurse Practitioner

## 2011-12-02 DIAGNOSIS — R0602 Shortness of breath: Secondary | ICD-10-CM

## 2011-12-02 LAB — TSH: TSH: 1.96 u[IU]/mL (ref 0.350–4.500)

## 2011-12-02 LAB — BASIC METABOLIC PANEL
BUN: 17 mg/dL (ref 6–23)
BUN: 18 mg/dL (ref 6–23)
CO2: 26 mEq/L (ref 19–32)
GFR calc Af Amer: >90 mL/min (ref >90)
GFR calc non Af Amer: 88 mL/min — ABNORMAL LOW (ref >90)
GFR calc non Af Amer: 70 mL/min — ABNORMAL LOW (ref >90)
Glucose, Bld: 299 mg/dL — ABNORMAL HIGH (ref 70–99)
Potassium: 4.3 mEq/L (ref 3.5–5.1)
Potassium: 4.2 mEq/L (ref 3.5–5.1)
Sodium: 138 mEq/L (ref 135–145)

## 2011-12-02 LAB — CBC
Hemoglobin: 11.1 g/dL — ABNORMAL LOW (ref 12.0–15.0)
MCHC: 32.3 g/dL (ref 30.0–36.0)
MCV: 92.8 fL (ref 78.0–100.0)
Platelets: 217 10*3/uL (ref 150–400)
RDW: 14.5 % (ref 11.5–15.5)
RDW: 14.5 % (ref 11.5–15.5)
WBC: 6.9 10*3/uL (ref 4.0–10.5)

## 2011-12-02 LAB — CARDIAC PANEL(CRET KIN+CKTOT+MB+TROPI)
CK, MB: 5.2 ng/mL — ABNORMAL HIGH (ref 0.3–4.0)
Relative Index: INVALID (ref 0.0–2.5)
Relative Index: INVALID (ref 0.0–2.5)
Total CK: 67 U/L (ref 7–177)
Total CK: 80 U/L (ref 7–177)
Total CK: 89 U/L (ref 7–177)
Troponin I: 0.42 ng/mL (ref <0.30)

## 2011-12-02 LAB — CREATININE, SERUM
Creatinine, Ser: 0.83 mg/dL (ref 0.50–1.10)
GFR calc Af Amer: 84 mL/min — ABNORMAL LOW (ref >90)
GFR calc non Af Amer: 72 mL/min — ABNORMAL LOW (ref >90)

## 2011-12-02 LAB — GLUCOSE, CAPILLARY
Glucose-Capillary: 251 mg/dL — ABNORMAL HIGH (ref 70–99)
Glucose-Capillary: 137 mg/dL — ABNORMAL HIGH (ref 70–99)

## 2011-12-02 MED ORDER — LOSARTAN POTASSIUM 50 MG PO TABS
50.0000 mg | ORAL_TABLET | Freq: Every day | ORAL | Status: DC
  Filled 2011-12-02: qty 1

## 2011-12-02 MED ORDER — SODIUM CHLORIDE 0.9 % IJ SOLN: 3.0000 mL | INTRAMUSCULAR | Status: DC | PRN

## 2011-12-02 MED ORDER — ALBUTEROL SULFATE (5 MG/ML) 0.5% IN NEBU
2.5000 mg | INHALATION_SOLUTION | Freq: Four times a day (QID) | RESPIRATORY_TRACT | Status: DC
  Administered 2011-12-02 – 2011-12-03 (×6): 2.5 mg via RESPIRATORY_TRACT
  Filled 2011-12-02 (×6): qty 0.5

## 2011-12-02 MED ORDER — HYDRALAZINE HCL 20 MG/ML IJ SOLN
10.0000 mg | Freq: Four times a day (QID) | INTRAMUSCULAR | Status: DC | PRN
  Administered 2011-12-02: 10 mg via INTRAVENOUS
  Filled 2011-12-02: qty 0.5

## 2011-12-02 MED ORDER — FUROSEMIDE 40 MG PO TABS
40.0000 mg | ORAL_TABLET | Freq: Two times a day (BID) | ORAL | Status: DC
  Administered 2011-12-02 – 2011-12-03 (×3): 40 mg via ORAL
  Filled 2011-12-02 (×4): qty 1

## 2011-12-02 MED ORDER — INSULIN GLARGINE 100 UNIT/ML ~~LOC~~ SOLN
35.0000 [IU] | Freq: Every day | SUBCUTANEOUS | Status: DC
  Administered 2011-12-02 (×2): 35 [IU] via SUBCUTANEOUS

## 2011-12-02 MED ORDER — IPRATROPIUM BROMIDE 0.02 % IN SOLN
0.5000 mg | Freq: Four times a day (QID) | RESPIRATORY_TRACT | Status: DC
  Administered 2011-12-02 – 2011-12-03 (×6): 0.5 mg via RESPIRATORY_TRACT
  Filled 2011-12-02 (×6): qty 2.5

## 2011-12-02 MED ORDER — SODIUM CHLORIDE 0.9 % IV SOLN: 250.0000 mL | INTRAVENOUS | Status: DC | PRN

## 2011-12-02 MED ORDER — CLOPIDOGREL BISULFATE 75 MG PO TABS
75.0000 mg | ORAL_TABLET | Freq: Every day | ORAL | Status: DC
  Administered 2011-12-02 – 2011-12-03 (×2): 75 mg via ORAL
  Filled 2011-12-02 (×2): qty 1

## 2011-12-02 MED ORDER — SIMVASTATIN 40 MG PO TABS
40.0000 mg | ORAL_TABLET | Freq: Every day | ORAL | Status: DC
  Administered 2011-12-02: 40 mg via ORAL
  Filled 2011-12-02 (×2): qty 1

## 2011-12-02 MED ORDER — INSULIN GLARGINE 100 UNIT/ML ~~LOC~~ SOLN
35.0000 [IU] | Freq: Every day | SUBCUTANEOUS | Status: DC
  Filled 2011-12-02: qty 3

## 2011-12-02 MED ORDER — ASPIRIN EC 81 MG PO TBEC
81.0000 mg | DELAYED_RELEASE_TABLET | Freq: Every day | ORAL | Status: DC
  Administered 2011-12-03: 81 mg via ORAL
  Filled 2011-12-02 (×2): qty 1

## 2011-12-02 MED ORDER — SPIRONOLACTONE 25 MG PO TABS
25.0000 mg | ORAL_TABLET | Freq: Every day | ORAL | Status: DC
  Administered 2011-12-02 – 2011-12-03 (×2): 25 mg via ORAL
  Filled 2011-12-02 (×2): qty 1

## 2011-12-02 MED ORDER — INSULIN ASPART 100 UNIT/ML ~~LOC~~ SOLN
4.0000 [IU] | Freq: Three times a day (TID) | SUBCUTANEOUS | Status: DC
  Administered 2011-12-02 – 2011-12-03 (×4): 4 [IU] via SUBCUTANEOUS

## 2011-12-02 MED ORDER — SODIUM CHLORIDE 0.9 % IJ SOLN
3.0000 mL | Freq: Two times a day (BID) | INTRAMUSCULAR | Status: DC
  Administered 2011-12-02 – 2011-12-03 (×4): 3 mL via INTRAVENOUS

## 2011-12-02 MED ORDER — TRAMADOL HCL 50 MG PO TABS: 50.0000 mg | ORAL_TABLET | Freq: Four times a day (QID) | ORAL | Status: DC | PRN

## 2011-12-02 MED ORDER — ISOSORBIDE MONONITRATE ER 30 MG PO TB24
30.0000 mg | ORAL_TABLET | Freq: Every day | ORAL | Status: DC
  Administered 2011-12-02 – 2011-12-03 (×2): 30 mg via ORAL
  Filled 2011-12-02 (×2): qty 1

## 2011-12-02 MED ORDER — FLUTICASONE PROPIONATE 50 MCG/ACT NA SUSP
1.0000 | Freq: Every day | NASAL | Status: DC
  Administered 2011-12-02 – 2011-12-03 (×2): 1 via NASAL
  Filled 2011-12-02: qty 16

## 2011-12-02 MED ORDER — ONDANSETRON HCL 4 MG PO TABS: 4.0000 mg | ORAL_TABLET | Freq: Four times a day (QID) | ORAL | Status: DC | PRN

## 2011-12-02 MED ORDER — LOSARTAN POTASSIUM 50 MG PO TABS
100.0000 mg | ORAL_TABLET | Freq: Every day | ORAL | Status: DC
  Administered 2011-12-02: 100 mg via ORAL
  Administered 2011-12-03 (×2): 50 mg via ORAL
  Filled 2011-12-02 (×3): qty 2

## 2011-12-02 MED ORDER — FLUTICASONE-SALMETEROL 250-50 MCG/DOSE IN AEPB
1.0000 | INHALATION_SPRAY | Freq: Two times a day (BID) | RESPIRATORY_TRACT | Status: DC
  Administered 2011-12-02 – 2011-12-03 (×3): 1 via RESPIRATORY_TRACT
  Filled 2011-12-02: qty 14

## 2011-12-02 MED ORDER — HEPARIN SODIUM (PORCINE) 5000 UNIT/ML IJ SOLN
5000.0000 [IU] | Freq: Three times a day (TID) | INTRAMUSCULAR | Status: DC
  Administered 2011-12-02 – 2011-12-03 (×5): 5000 [IU] via SUBCUTANEOUS
  Filled 2011-12-02 (×7): qty 1

## 2011-12-02 MED ORDER — ONDANSETRON HCL 4 MG/2ML IJ SOLN: 4.0000 mg | Freq: Four times a day (QID) | INTRAMUSCULAR | Status: DC | PRN

## 2011-12-02 MED ORDER — ASPIRIN 81 MG PO CHEW
243.0000 mg | CHEWABLE_TABLET | Freq: Once | ORAL | Status: AC
Start: 2011-12-02 — End: 2011-12-02
  Administered 2011-12-02: 243 mg via ORAL
  Filled 2011-12-02: qty 3

## 2011-12-02 MED ORDER — BIOTENE DRY MOUTH MT LIQD
15.0000 mL | Freq: Two times a day (BID) | OROMUCOSAL | Status: DC
  Administered 2011-12-02 – 2011-12-03 (×3): 15 mL via OROMUCOSAL

## 2011-12-02 MED ORDER — PREGABALIN 25 MG PO CAPS
50.0000 mg | ORAL_CAPSULE | Freq: Two times a day (BID) | ORAL | Status: DC
  Administered 2011-12-02 (×2): 50 mg via ORAL
  Administered 2011-12-03 (×2): 25 mg via ORAL
  Filled 2011-12-02: qty 1
  Filled 2011-12-02: qty 2
  Filled 2011-12-02: qty 1
  Filled 2011-12-02: qty 2

## 2011-12-02 MED ORDER — CARVEDILOL 25 MG PO TABS
25.0000 mg | ORAL_TABLET | Freq: Two times a day (BID) | ORAL | Status: DC
  Administered 2011-12-02 – 2011-12-03 (×4): 25 mg via ORAL
  Filled 2011-12-02 (×5): qty 1

## 2011-12-02 MED ORDER — SPIRONOLACTONE 12.5 MG HALF TABLET
12.5000 mg | ORAL_TABLET | Freq: Every day | ORAL | Status: DC
  Filled 2011-12-02: qty 1

## 2011-12-02 MED ORDER — INSULIN ASPART 100 UNIT/ML ~~LOC~~ SOLN
0.0000 [IU] | Freq: Three times a day (TID) | SUBCUTANEOUS | Status: DC
  Administered 2011-12-02: 15 [IU] via SUBCUTANEOUS
  Administered 2011-12-02 (×2): 8 [IU] via SUBCUTANEOUS
  Administered 2011-12-03: 2 [IU] via SUBCUTANEOUS
  Administered 2011-12-03: 3 [IU] via SUBCUTANEOUS
  Filled 2011-12-02: qty 3

## 2011-12-02 MED ORDER — NITROGLYCERIN 0.4 MG SL SUBL: 0.4000 mg | SUBLINGUAL_TABLET | SUBLINGUAL | Status: DC | PRN

## 2011-12-02 NOTE — Progress Notes (Signed)
Dr Dierdre Searles notified re patients BP=211/92. Pt asymptomatic no c/o cp .

## 2011-12-02 NOTE — Progress Notes (Signed)
[name] is a new patient to [facility] Care Management services.   1. She [facility]  I have contacted them to have her oxygen delivered to home today.   2. Her daughter and inpatient care manager [facility])  has been notified that the [facility]  Social Worker will  Assess her eligibility for [facility] medicaid in an attempt to begin the [facility] process.   3.Her daughter will be out of the home for approximately 10 hours daily for work.  Her DME company [employer] will discuss a home monitoring sytem with her that is similar to life alert.    If you have any additional questions or concerns please contact me at [phone].  Thank You

## 2011-12-02 NOTE — Progress Notes (Signed)
CRITICAL VALUE ALERT  Critical value received:  Troponin .10 * Date of notification:  12/02/11  Time of notification:  0201  Critical value read back:yes  Nurse who received alert:  r.Avannah Decker  MD notified (1st page):  Dr. Gilford Rile  Time of first page:  0201  MD notified (2nd page):n/a  Time of second page:n/a  Responding MD:  Dr Gilford Rile  Time MD responded:  [id number]

## 2011-12-02 NOTE — Progress Notes (Signed)
Inpatient Diabetes Program Recommendations  AACE/ADA: New Consensus Statement on Inpatient Glycemic Control (2009)  Target Ranges:  Prepandial:   less than 140 mg/dL      Peak postprandial:   less than 180 mg/dL (1-2 hours)      Critically ill patients:  140 - 180 mg/dL   Reason:  Consult for poor control dm.  Recommendations below.  May need increase in Lantus if fasting CBG tomorrow AM is still elevated >140.   Inpatient Diabetes Program Recommendations Insulin - Meal Coverage: Add Novolog 4 units with meals  Note: Patient was seen by our team last week and the appropriate inpatient education was provided by the RN with the assistance of the diabetes coordinator.  Patient is also being followed by the [facility] care management team.   Thank you  Piedad Climes RN,BSN,CDE Inpatient Diabetes Coordinator

## 2011-12-02 NOTE — Progress Notes (Signed)
Subjective: [name] BP was 211/92 at 4am this morning.  It responded to 10 mg of hydralazine.  She otherwise did well.  Her SOB has improved overnight and she felt better this morning.  She denied HA, dizziness, N/V, and CP.   Objective: Vital signs in last 24 hours: Filed Vitals:   12/02/11 0734 12/02/11 0747 12/02/11 0900 12/02/11 1142  BP: 160/79  142/64   Pulse:   85   Temp:   98.2 F (36.8 C)   TempSrc:   Oral   Resp:   18   Height:      Weight:      SpO2:  100% 100% 99%   Weight change:  16 lb. weight loss in 3 months.  Intake/Output Summary (Last 24 hours) at 12/02/11 1409 Last data filed at 12/02/11 1000  Gross per 24 hour  Intake    483 ml  Output    675 ml  Net   -192 ml   Physical Exam: General: alert, well-developed, and cooperative to examination.  Head: normocephalic and atraumatic.  Eyes: Vision grossly intact with good peripheral vision. Pupils equal, pupils round, pupils reactive to light, no injection and anicteric. Mild bilateral proptosis.  Neck: Bilateral carotid bruits and surgical scar over L carotid. Supple, full ROM, no JVD.  Lungs: Decreased air movement bilaterally with diffuse tubular breath sounds. No crackles heard on exam.  Heart: normal rate, regular rhythm, no murmur, no gallop, and no rub.  Pulses: diminished but present 1+ DP/PT pulses bilaterally Extremities: No edema, cyanosis, clubbing.   Neurologic: alert & oriented X3, cranial nerves II-XII intact, strength normal in all extremities, sensation intact to light touch. Skin: turgor normal and no rashes.   Lab Results: Basic Metabolic Panel:  Lab 12/02/11 1610 12/02/11 0615  NA 138 142  K 4.2 4.3  CL 103 105  CO2 26 27  GLUCOSE 299* 449*  BUN 18 17  CREATININE 0.85 0.73  CALCIUM 9.7 9.9  MG -- --  PHOS -- --   CBC:  Lab 12/02/11 0615 12/02/11 0127 11/25/11 2232  WBC 6.1 6.9 --  NEUTROABS -- -- 4.8  HGB 11.1* 11.9* --  HCT 34.4* 36.3 --  MCV 92.2 92.8 --  PLT 208 217  --    Lab 12/02/11 1159 12/02/11 0644 12/02/11 0054 11/28/11 1147 11/28/11 0659 11/28/11 0254  GLUCAP 286* 414* 323* 154* 140* 224*   Hemoglobin A1C:  Lab 11/26/11 1516  HGBA1C 15.2*   Fasting Lipid Panel:  Lab 11/27/11 0600  CHOL 247*  HDL 37*  LDLCALC 171*  TRIG 194*  CHOLHDL 6.7  LDLDIRECT --   Thyroid Function Tests:  Lab 12/02/11 0127 11/26/11 1516  TSH 1.960 --  T4TOTAL -- --  FREET4 -- 0.96  T3FREE -- --  THYROIDAB -- --   Urine Drug Screen: Drugs of Abuse     Component Value Date/Time   LABOPIA NONE DETECTED 11/26/2011 1937   COCAINSCRNUR NONE DETECTED 11/26/2011 1937   LABBENZ NONE DETECTED 11/26/2011 1937   AMPHETMU NONE DETECTED 11/26/2011 1937   THCU NONE DETECTED 11/26/2011 1937   LABBARB NONE DETECTED 11/26/2011 1937    Urinalysis:  Lab 11/25/11 2245  COLORURINE YELLOW  LABSPEC <1.005*  PHURINE 5.5  GLUCOSEU >1000*  HGBUR NEGATIVE  BILIRUBINUR NEGATIVE  KETONESUR NEGATIVE  PROTEINUR NEGATIVE  UROBILINOGEN 0.2  NITRITE NEGATIVE  LEUKOCYTESUR NEGATIVE   Studies/Results: Dg Chest 2 View  12/01/2011  *RADIOLOGY REPORT*  Clinical Data: Hypertension, diabetes, shortness of breath  CHEST - 2 VIEW  Comparison: 11/26/2011  Findings: Previous median sternotomy.  Stable moderate cardiomegaly.  Perihilar and bibasilar interstitial edema or infiltrates, perhaps marginally increased since previous exam.  No effusion however.  Mild spondylitic changes in the lower thoracic spine.  IMPRESSION:  1.  Persistent cardiomegaly with slight progression of interstitial edema or infiltrates.  Original Report Authenticated By: Osa Craver, M.D.   Medications:  I have reviewed patient's medications.  Scheduled Meds:    . albuterol  2.5 mg Nebulization STAT  . albuterol  2.5 mg Nebulization QID  . albuterol      . antiseptic oral rinse  15 mL Mouth Rinse BID  . aspirin  243 mg Oral Once  . aspirin EC  81 mg Oral Daily  . carvedilol  25 mg Oral BID WC  .  clopidogrel  75 mg Oral Daily  . fluticasone  1 spray Each Nare Daily  . Fluticasone-Salmeterol  1 puff Inhalation BID  . furosemide  40 mg Oral BID  . heparin  5,000 Units Subcutaneous Q8H  . insulin aspart  0-15 Units Subcutaneous TID WC  . insulin glargine  35 Units Subcutaneous QHS  . ipratropium      . ipratropium  0.5 mg Nebulization QID  . isosorbide mononitrate  30 mg Oral Daily  . losartan  100 mg Oral Daily  . methylPREDNISolone (SOLU-MEDROL) injection  125 mg Intravenous STAT  . pregabalin  50 mg Oral BID  . simvastatin  40 mg Oral QHS  . sodium chloride  3 mL Intravenous Q12H  . spironolactone  25 mg Oral Daily  . DISCONTD: insulin glargine  35 Units Subcutaneous QHS  . DISCONTD: losartan  50 mg Oral Daily  . DISCONTD: spironolactone  12.5 mg Oral Daily   Continuous Infusions:  PRN Meds:.sodium chloride, hydrALAZINE, nitroGLYCERIN, ondansetron (ZOFRAN) IV, ondansetron, sodium chloride, traMADol Assessment/Plan:  SOB:  Patient presented to the ED with progressive SOB and dyspnea on exertion with no chest pain or chest pressure. Patient showed no signs URI or pneumonia.  CXR did not show much change compared to the previous one taken a week ago.  Her modified Geneva score is 3, making PE unlikely.  Patient's troponin was borderline elevated.  EKG showed no change from previous ones. While a new cardiac ischemia could be a possible cause, patient's SOB is most likely secondary to CHF secondary to her poorly controlled HTN.  The mildly elevated troponin could be due to slight cardiac damage secondary to HTN.  Echo done on 2/3 showed EF 30%~35%.  Thus, current treatment will aim for better control of her HTN. We will continue to trend CE.  Patient also has COPD with chronic wheezing, which could also contribute to her SOB.  -Losartan 100mg  daily, increased from 50mg  -cycle CE -Continue home plavix, beta-blocker, ASA, statin, imdur, and nitroglycerin -continue Neb treatment.(  albuterol and Atrovent).  - will start Advair inhaler given her poorly controlled COPD.   Chronic systolic CHF:  Pro-BNP on admission 2407 and we do not have any previous baseline BNP to comparing to.  Last 2d Echo on 11/27/11 showed EF of 30% to 35% with moderate diffuse hypokinesis. Akinesis of the inferior myocardium. CXR findings are also consistent with mild vascular congestion, which is not significantly changed form previous CXR. pt reports compliance to her meds. No sign of CHF exacerbation. Given low EF and the fact that she fits NYHF stage III criteria, spironolactone will be stared. -Lasix 40mg  PO  two times a day -Continue coreg and ARB  -start Spironolactone 25mg  daily  DM, last Hb A1C 15.2 on 11/26/11  Patient's last Hb A1C was 15.2 on 11/26/11.  Her BG since admission ranges from 286~414.  Patient was on 35 units of lantus and 5 unit with meal at home.    -CBG  -SSI  -continue lantus 35 units daily.   Peripheral neuropathy:  Patient has complaint of chronic leg pain.  Due to her poorly controled DM, this is most likely diabetic neuropathy.  -start pregabalin 50mg  BID   Hyperlipidemia: Lipid panel showed LDL 171, HDL 37, and TG 194. She will need better long term control of cholesterol after discharge.  -Continue Simvastatin 40mg  daily  VTE prophylaxis:  -Heparin    LOS: 1 day   [name] 12/02/2011, 2:09 PM

## 2011-12-02 NOTE — H&P (Signed)
59 woman just on our service last week with a likely new stroke.  Has severe risks and prior episodes of vascular disease of heart, brain, and legs.  Returns with subacute SOB and is in CHF with uncontrolled HTN, mild interstitial edema on CXR, elevated BNP and possible new MI with slightly elevated troponin.  EKG unchanged with bi-atrial enlargement.  She is much better overnight on intensified CHF regimen.  Question is how closely she adhered to her prior discharge regimen.  If this is a new MI, she may have been adherent.  I agree with residents' plans and notes.  We discussed case thoroughly.

## 2011-12-02 NOTE — Progress Notes (Signed)
Subjective:  Breathing comfortable on my exam today.  Says she has been taking all of her medicines at home.    Objective: Vital signs in last 24 hours: Filed Vitals:   12/02/11 0734 12/02/11 0747 12/02/11 0900 12/02/11 1142  BP: 160/79  142/64   Pulse:   85   Temp:   98.2 F (36.8 C)   TempSrc:   Oral   Resp:   18   Height:      Weight:      SpO2:  100% 100% 99%   Physical Exam:  General: alert, well-developed, and cooperative to examination.  Head: normocephalic and atraumatic.  Eyes: Vision grossly intact with good peripheral vision. Pupils equal, pupils round, pupils reactive to light, no injection and anicteric. Mild bilateral proptosis.  Neck: Bilateral carotid bruits and surgical scar over L carotid. Supple, full ROM, no JVD.  Lungs: Clear lung fields.  Some upper airway noise/congestion over throat. No crackles heard on exam.  Heart: normal rate, regular rhythm, no murmur, no gallop, and no rub.  Pulses: diminished but present 1+ DP/PT pulses bilaterally  Extremities: No edema, cyanosis, clubbing.  Neurologic: alert & oriented X3, cranial nerves II-XII intact, strength normal in all extremities, sensation intact to light touch. Skin: turgor normal and no rashes.     Lab Results: Basic Metabolic Panel:  Lab 12/02/11 1610 12/02/11 0615  NA 138 142  K 4.2 4.3  CL 103 105  CO2 26 27  GLUCOSE 299* 449*  BUN 18 17  CREATININE 0.85 0.73  CALCIUM 9.7 9.9  MG -- --  PHOS -- --   Liver Function Tests:  Lab 12/01/11 2125  AST 16  ALT 14  ALKPHOS 115  BILITOT 0.2*  PROT 7.1  ALBUMIN 3.2*   CBC:  Lab 12/02/11 0615 12/02/11 0127 11/25/11 2232  WBC 6.1 6.9 --  NEUTROABS -- -- 4.8  HGB 11.1* 11.9* --  HCT 34.4* 36.3 --  MCV 92.2 92.8 --  PLT 208 217 --   Cardiac Enzymes:  Lab 12/02/11 0828 12/02/11 0012 12/01/11 2124  CKTOTAL 80 89 --  CKMB 5.2* 5.2* --  CKMBINDEX -- -- --  TROPONINI <0.30 0.42* 0.39*   BNP:  Lab 12/01/11 2130  PROBNP 2407.0*    CBG:  Lab 12/02/11 1159 12/02/11 0644 12/02/11 0054 11/28/11 1147 11/28/11 0659 11/28/11 0254  GLUCAP 286* 414* 323* 154* 140* 224*   Hemoglobin A1C:  Lab 11/26/11 1516  HGBA1C 15.2*   Fasting Lipid Panel:  Lab 11/27/11 0600  CHOL 247*  HDL 37*  LDLCALC 171*  TRIG 194*  CHOLHDL 6.7  LDLDIRECT --   Thyroid Function Tests:  Lab 12/02/11 0127 11/26/11 1516  TSH 1.960 --  T4TOTAL -- --  FREET4 -- 0.96  T3FREE -- --  THYROIDAB -- --   Urine Drug Screen: Drugs of Abuse     Component Value Date/Time   LABOPIA NONE DETECTED 11/26/2011 1937   COCAINSCRNUR NONE DETECTED 11/26/2011 1937   LABBENZ NONE DETECTED 11/26/2011 1937   AMPHETMU NONE DETECTED 11/26/2011 1937   THCU NONE DETECTED 11/26/2011 1937   LABBARB NONE DETECTED 11/26/2011 1937    Urinalysis:  Lab 11/25/11 2245  COLORURINE YELLOW  LABSPEC <1.005*  PHURINE 5.5  GLUCOSEU >1000*  HGBUR NEGATIVE  BILIRUBINUR NEGATIVE  KETONESUR NEGATIVE  PROTEINUR NEGATIVE  UROBILINOGEN 0.2  NITRITE NEGATIVE  LEUKOCYTESUR NEGATIVE   Studies/Results: Dg Chest 2 View  12/01/2011  *RADIOLOGY REPORT*  Clinical Data: Hypertension, diabetes, shortness of breath  CHEST - 2 VIEW  Comparison: 11/26/2011  Findings: Previous median sternotomy.  Stable moderate cardiomegaly.  Perihilar and bibasilar interstitial edema or infiltrates, perhaps marginally increased since previous exam.  No effusion however.  Mild spondylitic changes in the lower thoracic spine.  IMPRESSION:  1.  Persistent cardiomegaly with slight progression of interstitial edema or infiltrates.  Original Report Authenticated By: Osa Craver, M.D.   Medications: I have reviewed the patient's current medications. Scheduled Meds:   . albuterol  2.5 mg Nebulization STAT  . albuterol  2.5 mg Nebulization QID  . albuterol      . antiseptic oral rinse  15 mL Mouth Rinse BID  . aspirin  243 mg Oral Once  . aspirin EC  81 mg Oral Daily  . carvedilol  25 mg Oral BID WC   . clopidogrel  75 mg Oral Daily  . fluticasone  1 spray Each Nare Daily  . Fluticasone-Salmeterol  1 puff Inhalation BID  . furosemide  40 mg Oral BID  . heparin  5,000 Units Subcutaneous Q8H  . insulin aspart  0-15 Units Subcutaneous TID WC  . insulin glargine  35 Units Subcutaneous QHS  . ipratropium      . ipratropium  0.5 mg Nebulization QID  . isosorbide mononitrate  30 mg Oral Daily  . losartan  100 mg Oral Daily  . methylPREDNISolone (SOLU-MEDROL) injection  125 mg Intravenous STAT  . pregabalin  50 mg Oral BID  . simvastatin  40 mg Oral QHS  . sodium chloride  3 mL Intravenous Q12H  . spironolactone  25 mg Oral Daily  . DISCONTD: insulin glargine  35 Units Subcutaneous QHS  . DISCONTD: losartan  50 mg Oral Daily  . DISCONTD: spironolactone  12.5 mg Oral Daily   Continuous Infusions:  PRN Meds:.sodium chloride, hydrALAZINE, nitroGLYCERIN, ondansetron (ZOFRAN) IV, ondansetron, sodium chloride, traMADol    Assessment/Plan:  CHF Exacerbation: Most likely cause of her SOB since last discharge.  CXRay showed likely increased interstitial edema/infiltrates.  Dry cough present.  High BP likely contributing.  Will continue home Lasix dose, but will increased losartan to 100mg  daily and add spironolactone to control BP.  Hopefully these will help her CHF exacerbation and breathing. Reports full compliance at home?   Troponin Elevation: Mild troponin elevation x 2, back to normal now.  Likely demand ischemia from her HTN and CHF given no EKG changes.  Full dose ASA given, but ACS very unlikely with no EKG changes and no CP.    DM: Diabetes educator consulted.  Sugars high on admission despite her reporting full compliance at home.  Agree with remainder of excellent note and A/P by MS3 Maren [place]    LOS: 1 day   Blanca Friend 12/02/2011, 3:10 PM

## 2011-12-03 LAB — BASIC METABOLIC PANEL
BUN: 24 mg/dL — ABNORMAL HIGH (ref 6–23)
CO2: 31 mEq/L (ref 19–32)
Calcium: 9.6 mg/dL (ref 8.4–10.5)
Chloride: 102 mEq/L (ref 96–112)
Creatinine, Ser: 0.99 mg/dL (ref 0.50–1.10)
GFR calc Af Amer: 68 mL/min — ABNORMAL LOW (ref >90)
GFR calc non Af Amer: 59 mL/min — ABNORMAL LOW (ref >90)
Glucose, Bld: 153 mg/dL — ABNORMAL HIGH (ref 70–99)
Potassium: 3.8 mEq/L (ref 3.5–5.1)
Sodium: 139 mEq/L (ref 135–145)

## 2011-12-03 LAB — GLUCOSE, CAPILLARY
Glucose-Capillary: 154 mg/dL — ABNORMAL HIGH (ref 70–99)
Glucose-Capillary: 111 mg/dL — ABNORMAL HIGH (ref 70–99)
Glucose-Capillary: 126 mg/dL — ABNORMAL HIGH (ref 70–99)

## 2011-12-03 MED ORDER — PREGABALIN 50 MG PO CAPS: 50.0000 mg | ORAL_CAPSULE | Freq: Two times a day (BID) | ORAL | Status: DC

## 2011-12-03 MED ORDER — FLUTICASONE-SALMETEROL 250-50 MCG/DOSE IN AEPB: 1.0000 | INHALATION_SPRAY | Freq: Two times a day (BID) | RESPIRATORY_TRACT | Status: DC

## 2011-12-03 MED ORDER — LOSARTAN POTASSIUM 100 MG PO TABS: 100.0000 mg | ORAL_TABLET | Freq: Every day | ORAL | Status: DC

## 2011-12-03 MED ORDER — ACETAMINOPHEN 325 MG PO TABS
650.0000 mg | ORAL_TABLET | Freq: Four times a day (QID) | ORAL | Status: DC | PRN
  Administered 2011-12-03: 650 mg via ORAL
  Filled 2011-12-03: qty 2

## 2011-12-03 MED ORDER — SPIRONOLACTONE 25 MG PO TABS: 25.0000 mg | ORAL_TABLET | Freq: Every day | ORAL | Status: DC

## 2011-12-03 NOTE — Progress Notes (Signed)
Pt had an 11 beat run of Vtach Asymptomatic stated she was moving about in the room. Bp 88/46 HR 72 Spo2 98 on RA Md made aware no orders received will monitor.

## 2011-12-03 NOTE — Progress Notes (Signed)
Subjective: Doing well overnight.  States she feels great today.  Denies chest pain, SOB, nausea, or DOE.  She still has the O2 on.  States she slept well and feels like she is ready to go home.    Objective: Vital signs in last 24 hours: Filed Vitals:   12/02/11 2123 12/03/11 0506 12/03/11 0921 12/03/11 1000  BP: 137/80 159/67  113/63  Pulse: 79 78  71  Temp: 97.5 F (36.4 C) 98.1 F (36.7 C)  98 F (36.7 C)  TempSrc: Oral Oral  Oral  Resp: 18 20  22   Height:      Weight:  148 lb 3.2 oz (67.223 kg)    SpO2: 100% 100% 100% 100%   Weight change: -1 lb 12.8 oz (-0.816 kg)  Intake/Output Summary (Last 24 hours) at 12/03/11 1104 Last data filed at 12/03/11 1000  Gross per 24 hour  Intake   1200 ml  Output   2275 ml  Net  -1075 ml   Physical Exam: Vitals reviewed. General: resting in bed, NAD HEENT: PERRL, EOMI, no scleral icterus Cardiac: RRR, no rubs, murmurs or gallops Pulm: clear to auscultation bilaterally, no wheezes, rales, or rhonchi Abd: soft, nontender, nondistended, BS present Ext: warm and well perfused, no pedal edema Neuro: alert and oriented X3, cranial nerves II-XII grossly intact, strength and sensation to light touch equal in bilateral upper and lower extremities  Lab Results: Basic Metabolic Panel:  Lab 12/03/11 0981 12/02/11 1136  NA 139 138  K 3.8 4.2  CL 102 103  CO2 31 26  GLUCOSE 153* 299*  BUN 24* 18  CREATININE 0.99 0.85  CALCIUM 9.6 9.7  MG -- --  PHOS -- --   Liver Function Tests:  Lab 12/01/11 2125  AST 16  ALT 14  ALKPHOS 115  BILITOT 0.2*  PROT 7.1  ALBUMIN 3.2*   CBC:  Lab 12/02/11 0615 12/02/11 0127  WBC 6.1 6.9  NEUTROABS -- --  HGB 11.1* 11.9*  HCT 34.4* 36.3  MCV 92.2 92.8  PLT 208 217   Cardiac Enzymes:  Lab 12/02/11 1619 12/02/11 0828 12/02/11 0012  CKTOTAL 67 80 89  CKMB 4.8* 5.2* 5.2*  CKMBINDEX -- -- --  TROPONINI 0.40* <0.30 0.42*   BNP:  Lab 12/01/11 2130  PROBNP 2407.0*   CBG:  Lab 12/03/11  0640 12/02/11 2120 12/02/11 1621 12/02/11 1159 12/02/11 0644 12/02/11 0054  GLUCAP 154* 137* 251* 286* 414* 323*   Hemoglobin A1C:  Lab 11/26/11 1516  HGBA1C 15.2*   Fasting Lipid Panel:  Lab 11/27/11 0600  CHOL 247*  HDL 37*  LDLCALC 171*  TRIG 194*  CHOLHDL 6.7  LDLDIRECT --   Thyroid Function Tests:  Lab 12/02/11 0127 11/26/11 1516  TSH 1.960 --  T4TOTAL -- --  FREET4 -- 0.96  T3FREE -- --  THYROIDAB -- --   Drugs of Abuse     Component Value Date/Time   LABOPIA NONE DETECTED 11/26/2011 1937   COCAINSCRNUR NONE DETECTED 11/26/2011 1937   LABBENZ NONE DETECTED 11/26/2011 1937   AMPHETMU NONE DETECTED 11/26/2011 1937   THCU NONE DETECTED 11/26/2011 1937   LABBARB NONE DETECTED 11/26/2011 1937    Studies/Results: Dg Chest 2 View  12/01/2011  *RADIOLOGY REPORT*  Clinical Data: Hypertension, diabetes, shortness of breath  CHEST - 2 VIEW  Comparison: 11/26/2011  Findings: Previous median sternotomy.  Stable moderate cardiomegaly.  Perihilar and bibasilar interstitial edema or infiltrates, perhaps marginally increased since previous exam.  No effusion however.  Mild spondylitic changes in the lower thoracic spine.  IMPRESSION:  1.  Persistent cardiomegaly with slight progression of interstitial edema or infiltrates.  Original Report Authenticated By: Osa Craver, M.D.   Medications: I have reviewed the patient's current medications. Scheduled Meds:   . albuterol  2.5 mg Nebulization QID  . antiseptic oral rinse  15 mL Mouth Rinse BID  . aspirin EC  81 mg Oral Daily  . carvedilol  25 mg Oral BID WC  . clopidogrel  75 mg Oral Daily  . fluticasone  1 spray Each Nare Daily  . Fluticasone-Salmeterol  1 puff Inhalation BID  . furosemide  40 mg Oral BID  . heparin  5,000 Units Subcutaneous Q8H  . insulin aspart  0-15 Units Subcutaneous TID WC  . insulin aspart  4 Units Subcutaneous TID WC  . insulin glargine  35 Units Subcutaneous QHS  . ipratropium  0.5 mg Nebulization QID    . isosorbide mononitrate  30 mg Oral Daily  . losartan  100 mg Oral Daily  . pregabalin  50 mg Oral BID  . simvastatin  40 mg Oral QHS  . sodium chloride  3 mL Intravenous Q12H  . spironolactone  25 mg Oral Daily  . DISCONTD: spironolactone  12.5 mg Oral Daily   Continuous Infusions:  PRN Meds:.sodium chloride, hydrALAZINE, nitroGLYCERIN, ondansetron (ZOFRAN) IV, ondansetron, sodium chloride, traMADol Assessment/Plan: 1. SOB: Patient presented to the ED with progressive SOB and dyspnea on exertion with no chest pain or chest pressure. Patient showed no signs URI or pneumonia. CXR did not show much change compared to the previous one taken a week ago. Her modified Geneva score is 3, making PE unlikely. Patient's troponin was borderline elevated. EKG showed no change from previous ones. While a new cardiac ischemia could be a possible cause, patient's SOB is most likely secondary to CHF secondary to her poorly controlled HTN. The mildly elevated troponin could be due to slight cardiac damage secondary to HTN. Echo done on 2/3 showed EF 30%~35%. Thus, current treatment will aim for better control of her HTN. We trended CE and it returned to normal and then bumped again to 0.40 yesterday afternoon.  Patient continues to deny chest pain or SOB today.  Patient also has COPD with chronic wheezing, which could also contribute to her SOB.  - Walk without O2 to ensure she does not drop below 88-90%.  -Losartan 100mg  daily, increased from 50mg .   -One more Troponin to ensure resolution.   -Continue home plavix, beta-blocker, ASA, statin, imdur, and nitroglycerin  -continue Neb treatment.( albuterol and Atrovent).  - will start Advair inhaler given her poorly controlled COPD.   2. Chronic systolic CHF: Pro-BNP on admission 2407 and we do not have any previous baseline BNP to comparing to. Last 2d Echo on 11/27/11 showed EF of 30% to 35% with moderate diffuse hypokinesis. Akinesis of the inferior myocardium.  CXR findings are also consistent with mild vascular congestion, which is not significantly changed form previous CXR. pt reports compliance to her meds. No sign of CHF exacerbation. Given low EF and the fact that she fits NYHF stage III criteria, spironolactone will be stared. -Lasix 40mg  PO two times a day -Continue coreg and ARB  - Continue Spironolactone 25mg  daily  3. DM, last Hb A1C 15.2 on 11/26/11: Patient's last Hb A1C was 15.2 on 11/26/11. Her BG since admission ranges from 286~414. Patient was on 35 units of lantus and 5 unit with meal at  home.  -CBG  -SSI  -continue lantus 35 units daily.   4. Peripheral neuropathy:  Patient has complaint of chronic leg pain. Due to her poorly controled DM, this is most likely diabetic neuropathy.  -start pregabalin 50mg  BID   5. Hyperlipidemia: Lipid panel showed LDL 171, HDL 37, and TG 194. She will need better long term control of cholesterol after discharge.  -Continue Simvastatin 40mg  daily   6. VTE prophylaxis:  -Heparin   LOS: 2 days   [name] 12/03/2011, 11:04 AM

## 2011-12-03 NOTE — Evaluation (Signed)
Physical Therapy Evaluation Patient Details Name: [name] MRN: [id number] DOB: October 09, 1946 Today's Date: 12/03/2011  Problem List:  Patient Active Problem List  Diagnoses  . History of MI (myocardial infarction)  . CAD (coronary artery disease)  . Chronic systolic heart failure  . S/P CABG (coronary artery bypass graft)  . PAD (peripheral artery disease)  . Hx-TIA (transient ischemic attack)  . Diabetes mellitus  . Hypertension  . Hyperlipidemia  . CVA (cerebral infarction)  . COPD (chronic obstructive pulmonary disease)  . H/O hyperthyroidism s/p thyroidectomy  . Peripheral neuropathy  . SOB (shortness of breath)    Past Medical History:  Past Medical History  Diagnosis Date  . Diabetes mellitus     on Lantus 30 U   . MI (myocardial infarction)   . CAD (coronary artery disease)   . Heart failure     [place], [place] previous records.   . S/P CABG (coronary artery bypass graft)   . PAD (peripheral artery disease)   . Stroke   . CHF (congestive heart failure)   . Pneumonia   . Hypothyroidism    Past Surgical History:  Past Surgical History  Procedure Date  . Ptca   . Thyroidectomy   . Coronary artery bypass graft     2 valves  . Carotid endarterectomy   . Cholecystectomy, laparoscopic less than 6 months ago  . Cholecystectomy     PT Assessment/Plan/Recommendation PT Assessment Clinical Impression Statement: Pt is 66 y/o female admitted for SOB and with recent diagnosis of CVA.  Pt moving well and has no further acute PT needs.  Pt educated on energy conservation and increase activity. PT Recommendation/Assessment: Patent does not need any further PT services No Skilled PT: Patient at baseline level of functioning;All education completed;Patient will have necessary level of assist by caregiver at discharge PT Recommendation Follow Up Recommendations: No PT follow up;Supervision - Intermittent Equipment Recommended: None recommended by PT PT Goals     PT  Evaluation Precautions/Restrictions  Restrictions Weight Bearing Restrictions: No Prior Functioning  Home Living Lives With: Daughter Receives Help From: Family Type of Home: Apartment Home Layout: Other (Comment) (3rd floor) Home Access: Stairs to enter Entrance Stairs-Rails: Right Entrance Stairs-Number of Steps: 20+ Bathroom Shower/Tub: Tub/shower unit;Curtain Firefighter: Standard Bathroom Accessibility: Yes How Accessible: Accessible via walker Home Adaptive Equipment: Straight cane Prior Function Level of Independence: Needs assistance with tranfers Able to Take Stairs?: Yes Driving: No 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, [employer] Overall Cognitive Status: Appears within functional limits for tasks assessed Orientation Level: Oriented X4 Sensation/Coordination   Extremity Assessment RUE Assessment RUE Assessment: Within Functional Limits LUE Assessment LUE Assessment: Within Functional Limits RLE Assessment RLE Assessment: Within Functional Limits LLE Assessment LLE Assessment: Within Functional Limits Mobility (including Balance) Bed Mobility Bed Mobility: No Supine to Sit: 6: Modified independent (Device/Increase time) Sitting - Scoot to Edge of Bed: 6: Modified independent (Device/Increase time) Sit to Supine: 6: Modified independent (Device/Increase time) Transfers Transfers: Yes Sit to Stand: 6: Modified independent (Device/Increase time) Stand to Sit: 6: Modified independent (Device/Increase time) Ambulation/Gait Ambulation/Gait: Yes Ambulation/Gait Assistance: 6: Modified independent (Device/Increase time) Ambulation Distance (Feet): 150 Feet Assistive device: None Gait Pattern: Within Functional Limits Gait velocity: Normal gait speed Stairs: Yes Stairs Assistance: 5: Supervision Stairs Assistance Details (indicate cue type and reason): VC for hand placement and stair technique Stair Management Technique: One rail  Right;Forwards;Alternating pattern Number of Stairs: 12   Posture/Postural Control Posture/Postural Control: No significant limitations Balance Balance Assessed: Yes  Static Sitting Balance Static Sitting - Balance Support: Feet supported Static Sitting - Level of Assistance: 7: Independent Exercise    End of Session PT - End of Session Equipment Utilized During Treatment: Gait belt Activity Tolerance: Patient tolerated treatment well Patient left: in bed;with call bell in reach;with family/visitor present (sitting EOB) Nurse Communication: Mobility status for transfers;Mobility status for ambulation General Behavior During Session: [facility] for tasks performed Cognition: [facility] for tasks performed  Rogenia Werntz 12/03/2011, 3:00 PM [phone]

## 2011-12-03 NOTE — Progress Notes (Signed)
Pt ambulated in hallway on RA at rest spo2 at 96% during ambulation 94 % MD made aware

## 2011-12-03 NOTE — Discharge Summary (Signed)
Internal Medicine Teaching [facility] Discharge Note  Name: [name] MRN: [id number] DOB: [birth year] 66 y.o.  Date of Admission: 12/01/2011  8:48 PM Date of Discharge: 12/03/2011 Attending Physician: Rocco Serene, MD  Discharge Diagnosis: 1. SOB (shortness of breath) 2. Chronic systolic Heart Failure 3. DM 4. Peripheral neuropathy 6. Hyperlipidemia 7.  CAD (coronary artery disease) 8. Hypertension 9. COPD (chronic obstructive pulmonary disease)  Discharge Medications: Medication List  As of 12/03/2011  1:48 PM   TAKE these medications         acetaminophen 500 MG tablet   Commonly known as: TYLENOL   Take 500 mg by mouth every 6 (six) hours as needed. For pain      albuterol 108 (90 BASE) MCG/ACT inhaler   Commonly known as: PROVENTIL HFA;VENTOLIN HFA   Inhale 2 puffs into the lungs every 6 (six) hours as needed. For wheezing      albuterol (5 MG/ML) 0.5% nebulizer solution   Commonly known as: PROVENTIL   Take 2.5 mg by nebulization 4 (four) times daily.      aspirin EC 81 MG tablet   Take 81 mg by mouth daily.      Blood Glucose Monitoring Suppl W/DEVICE Kit   Use to check blood sugar 2 times daily      carvedilol 25 MG tablet   Commonly known as: COREG   Take 25 mg by mouth 2 (two) times daily with a meal.      clopidogrel 75 MG tablet   Commonly known as: PLAVIX   Take 75 mg by mouth daily.      Fluticasone-Salmeterol 250-50 MCG/DOSE Aepb   Commonly known as: ADVAIR   Inhale 1 puff into the lungs 2 (two) times daily.      furosemide 40 MG tablet   Commonly known as: LASIX   Take 40 mg by mouth 2 (two) times daily.      insulin aspart 100 UNIT/ML injection   Commonly known as: novoLOG   Inject 5 Units into the skin 3 (three) times daily before meals.      insulin glargine 100 UNIT/ML injection   Commonly known as: LANTUS   Inject 35 Units into the skin at bedtime.      ipratropium 0.02 % nebulizer solution   Commonly known as: ATROVENT   Take 0.5 mg by nebulization 4 (four) times daily.      isosorbide mononitrate 60 MG 24 hr tablet   Commonly known as: IMDUR   Take 30 mg by mouth every morning.      losartan 100 MG tablet   Commonly known as: COZAAR   Take 1 tablet (100 mg total) by mouth daily.      mometasone 50 MCG/ACT nasal spray   Commonly known as: NASONEX   Place 2 sprays into the nose at bedtime.      nitroGLYCERIN 0.4 MG SL tablet   Commonly known as: NITROSTAT   Place 0.4 mg under the tongue every 5 (five) minutes as needed. For chest pain      potassium chloride SA 20 MEQ tablet   Commonly known as: K-DUR,KLOR-CON   Take 20 mEq by mouth 2 (two) times daily.      pregabalin 50 MG capsule   Commonly known as: LYRICA   Take 1 capsule (50 mg total) by mouth 2 (two) times daily.      simvastatin 40 MG tablet   Commonly known as: ZOCOR   Take 40 mg by mouth  at bedtime.      spironolactone 25 MG tablet   Commonly known as: ALDACTONE   Take 1 tablet (25 mg total) by mouth daily.      traMADol 50 MG tablet   Commonly known as: ULTRAM   Take 50 mg by mouth every 6 (six) hours as needed. For pain           Disposition and follow-up:   Ms.[name] was discharged from [facility] in Stable condition.  She should make a follow up with her PCP in about 1 week or so.   Follow-up Appointments:  Discharge Orders    Future Appointments: Provider: Department: Dept Phone: Center:   12/21/2011 3:15 PM Barbaraann Share, MD [facility] [phone] None     Future Orders Please Complete By Expires   Diet - low sodium heart healthy      Increase activity slowly      Discharge instructions      Comments:   1. Start Advair 1 puff twice daily for your breathing. 2.  Continue the nebulizers every 4 hours as needed for your breathing. 3.  Start Lyrica 50 mg tablets 1 tablet twice daily for the pain in your feet. 4.  Start Spironolactone 25 mg tablets take 1 tablet daily for your blood  pressure. 5. Increase your Losartan to 100 mg daily for your blood pressure. 6.  Continue all of your other medications as prescribed. 7.  Follow up with your new PCP within 1 week or so.   Call MD for:  difficulty breathing, headache or visual disturbances        Consultations: None  Procedures Performed:  Dg Chest 2 View  12/01/2011  *RADIOLOGY REPORT*  Clinical Data: Hypertension, diabetes, shortness of breath  CHEST - 2 VIEW  Comparison: 11/26/2011  Findings: Previous median sternotomy.  Stable moderate cardiomegaly.  Perihilar and bibasilar interstitial edema or infiltrates, perhaps marginally increased since previous exam.  No effusion however.  Mild spondylitic changes in the lower thoracic spine.  IMPRESSION:  1.  Persistent cardiomegaly with slight progression of interstitial edema or infiltrates.  Original Report Authenticated By: Osa Craver, M.D.   Admission HPI: This is a 66 year old female with PMH of COPD, DM 2, CAD with CHF and MI, PAD and recent hospitalization for CVA who presents with shortness breath. Patient states that she started to notice progressively worsening of shortness breath accompanied with wheezing since her discharge from hospital on 11/28/11. The shortness of breath is intermittent, gradual onset, worsening with exertion and partially relieved by home nebulizer regimen. The EMS has gone to patient's house nightly to administer nebulizer treatments as well. Patient reports that she had acute onset shortness breath worse than before at 7 PM tonight While she's eating dinner, and she called EMS again and received duo neb treatment x2 without any improvement. She was brought into [facility] for further evaluation.  Denies headache, fever, or sore throat. Denies chest pain, chest pressure or palpitation. Denies nausea, vomiting, or abdominal pain. Denies melena, diarrhea or incontinence. No muscle weakness. Denies depression. No appetite or weight changes.  Of  note, patient was discharged from [facility] hospital 3 days ago following an admission for CVA. She notes that she has been compliant with her medications, and beyond the CVA she has been in her usual state of health  Hospital Course by problem list: 1. SOB: Patient presented to the ED with progressive SOB and dyspnea on exertion with  no chest pain or chest pressure. Her blood pressure at the time was 200s/100s.  Patient showed no signs URI or pneumonia. CXR did not show much change compared to the previous one taken a week ago. Her modified Geneva score is 3, making PE unlikely. Patient's troponin was borderline elevated. EKG showed no change from previous ones. While a new cardiac ischemia could be a possible cause, patient's SOB is most likely secondary to CHF secondary to her poorly controlled HTN. The mildly elevated troponin could be due to slight cardiac damage secondary to HTN. Echo done on 2/3 showed EF 30%~35%. Thus, current treatment will aim for better control of her HTN. We trended CE and it returned to normal and then bumped again to 0.40 and then back down to 0.3 which is within the lab error for the testing.  Patient continues to deny chest pain or SOB on the date of discharge.  Ambulatory O2 Saturations were >94% on the date of discharge.   2. Chronic systolic CHF: Pro-BNP on admission 2407 and we do not have any previous baseline BNP to comparing to. Last 2d Echo on 11/27/11 showed EF of 30% to 35% with moderate diffuse hypokinesis. Akinesis of the inferior myocardium. CXR findings are also consistent with mild vascular congestion, which is not significantly changed form previous CXR. pt reports compliance to her meds. No sign of CHF exacerbation. Given low EF and the fact that she fits NYHF stage III criteria, spironolactone was started and her lasix was increased to 40 mg BID.    3. Hypertension:  On admission her BP was elevated into the 200s systolic and 100 diastolic and she had some mild  troponin leak associated with it.  Her BP regimen was titrated upwards to Coreg 25 mg BID, Losartan 100 mg daily, Lasix 40 mg BID, and Spironolactone 25 mg daily.  On the date of discharge her blood pressure was much improved and she will need continued follow up as an outpatient.   4. DM, last Hb A1C 15.2 on 11/26/11: Patient's last Hb A1C was 15.2 on 11/26/11. Her BG since admission ranges from 286~414. Patient was on 35 units of lantus and 5 unit with meal at home.  She was better controlled the date of discharge and will continue on her home doses with close titration by her PCP.    5. Peripheral neuropathy: Patient has complaint of chronic leg and food pain which is likely due to her poorly controled DM.  She states that she has tried Neurontin before and that it made her very sleepy even though it worked well.  We started Lyrica and she was tolerating it well.  We will discharge her on the medication and have her followed up by her PCP.   6. Hyperlipidemia: Lipid panel showed LDL 171, HDL 37, and TG 194. She will need better long term control of cholesterol after discharge.  She was discharged on Simvastatin 40 mg daily.  7. COPD: Her COPD is poorly controlled on the outpatient nebulizers so we will start her on Advair here in the hospital and continue to follow with her PCP.  She will continue to use her Home O2 at bedtime as she has before.   Discharge Vitals:  BP 113/63  Pulse 71  Temp(Src) 98 F (36.7 C) (Oral)  Resp 22  Ht 5\' 1"  (1.549 m)  Wt 148 lb 3.2 oz (67.223 kg)  BMI 28.00 kg/m2  SpO2 92%  Discharge Labs:  Results for orders placed  during the hospital encounter of 12/01/11 (from the past 24 hour(s))  CARDIAC PANEL(CRET KIN+CKTOT+MB+TROPI)     Status: Abnormal   Collection Time   12/02/11  4:19 PM      Component Value Range   Total CK 67  7 - 177 (U/L)   CK, MB 4.8 (*) 0.3 - 4.0 (ng/mL)   Troponin I 0.40 (*) <0.30 (ng/mL)   Relative Index RELATIVE INDEX IS INVALID  0.0 - 2.5     GLUCOSE, CAPILLARY     Status: Abnormal   Collection Time   12/02/11  4:21 PM      Component Value Range   Glucose-Capillary 251 (*) 70 - 99 (mg/dL)   Comment 1 Notify RN    GLUCOSE, CAPILLARY     Status: Abnormal   Collection Time   12/02/11  9:20 PM      Component Value Range   Glucose-Capillary 137 (*) 70 - 99 (mg/dL)  BASIC METABOLIC PANEL     Status: Abnormal   Collection Time   12/03/11  6:00 AM      Component Value Range   Sodium 139  135 - 145 (mEq/L)   Potassium 3.8  3.5 - 5.1 (mEq/L)   Chloride 102  96 - 112 (mEq/L)   CO2 31  19 - 32 (mEq/L)   Glucose, Bld 153 (*) 70 - 99 (mg/dL)   BUN 24 (*) 6 - 23 (mg/dL)   Creatinine, Ser 1.61  0.50 - 1.10 (mg/dL)   Calcium 9.6  8.4 - 09.6 (mg/dL)   GFR calc non Af Amer 59 (*) >90 (mL/min)   GFR calc Af Amer 68 (*) >90 (mL/min)  GLUCOSE, CAPILLARY     Status: Abnormal   Collection Time   12/03/11  6:40 AM      Component Value Range   Glucose-Capillary 154 (*) 70 - 99 (mg/dL)   Comment 1 Notify RN     Comment 2 Documented in Chart    GLUCOSE, CAPILLARY     Status: Abnormal   Collection Time   12/03/11 11:27 AM      Component Value Range   Glucose-Capillary 111 (*) 70 - 99 (mg/dL)   Comment 1 Documented in Chart     Comment 2 Notify RN    TROPONIN I     Status: Abnormal   Collection Time   12/03/11 12:25 PM      Component Value Range   Troponin I 0.30 (*) <0.30 (ng/mL)   Signed: Kaidyn Javid 12/03/2011, 1:48 PM

## 2011-12-14 ENCOUNTER — Inpatient Hospital Stay (HOSPITAL_COMMUNITY)
Admission: EM | Admit: 2011-12-14 | Discharge: 2011-12-19 | DRG: 683 | Disposition: A | Discharge status: Home / Self Care | Payer: Medicare Other | Attending: Internal Medicine | Admitting: Internal Medicine

## 2011-12-14 DIAGNOSIS — I739 Peripheral vascular disease, unspecified: Secondary | ICD-10-CM | POA: Diagnosis present

## 2011-12-14 DIAGNOSIS — J4489 Other specified chronic obstructive pulmonary disease: Secondary | ICD-10-CM | POA: Diagnosis present

## 2011-12-14 DIAGNOSIS — I509 Heart failure, unspecified: Secondary | ICD-10-CM | POA: Diagnosis present

## 2011-12-14 DIAGNOSIS — Z79899 Other long term (current) drug therapy: Secondary | ICD-10-CM

## 2011-12-14 DIAGNOSIS — E1165 Type 2 diabetes mellitus with hyperglycemia: Secondary | ICD-10-CM | POA: Diagnosis present

## 2011-12-14 DIAGNOSIS — I252 Old myocardial infarction: Secondary | ICD-10-CM

## 2011-12-14 DIAGNOSIS — IMO0001 Reserved for inherently not codable concepts without codable children: Secondary | ICD-10-CM | POA: Diagnosis present

## 2011-12-14 DIAGNOSIS — Z9861 Coronary angioplasty status: Secondary | ICD-10-CM

## 2011-12-14 DIAGNOSIS — N179 Acute kidney failure, unspecified: Principal | ICD-10-CM | POA: Diagnosis present

## 2011-12-14 DIAGNOSIS — E890 Postprocedural hypothyroidism: Secondary | ICD-10-CM | POA: Diagnosis present

## 2011-12-14 DIAGNOSIS — I2510 Atherosclerotic heart disease of native coronary artery without angina pectoris: Secondary | ICD-10-CM | POA: Diagnosis present

## 2011-12-14 DIAGNOSIS — R0602 Shortness of breath: Secondary | ICD-10-CM

## 2011-12-14 DIAGNOSIS — J449 Chronic obstructive pulmonary disease, unspecified: Secondary | ICD-10-CM | POA: Diagnosis present

## 2011-12-14 DIAGNOSIS — Z951 Presence of aortocoronary bypass graft: Secondary | ICD-10-CM

## 2011-12-14 DIAGNOSIS — Z8673 Personal history of transient ischemic attack (TIA), and cerebral infarction without residual deficits: Secondary | ICD-10-CM

## 2011-12-14 DIAGNOSIS — Z794 Long term (current) use of insulin: Secondary | ICD-10-CM

## 2011-12-14 DIAGNOSIS — E875 Hyperkalemia: Secondary | ICD-10-CM | POA: Diagnosis present

## 2011-12-14 DIAGNOSIS — I5042 Chronic combined systolic (congestive) and diastolic (congestive) heart failure: Secondary | ICD-10-CM | POA: Diagnosis present

## 2011-12-14 DIAGNOSIS — Z7902 Long term (current) use of antithrombotics/antiplatelets: Secondary | ICD-10-CM

## 2011-12-14 DIAGNOSIS — E039 Hypothyroidism, unspecified: Secondary | ICD-10-CM | POA: Diagnosis present

## 2011-12-14 DIAGNOSIS — Z7982 Long term (current) use of aspirin: Secondary | ICD-10-CM

## 2011-12-14 DIAGNOSIS — I5022 Chronic systolic (congestive) heart failure: Secondary | ICD-10-CM | POA: Diagnosis present

## 2011-12-14 DIAGNOSIS — I10 Essential (primary) hypertension: Secondary | ICD-10-CM | POA: Diagnosis present

## 2011-12-14 DIAGNOSIS — Z87891 Personal history of nicotine dependence: Secondary | ICD-10-CM

## 2011-12-14 MED ORDER — ALBUTEROL SULFATE (5 MG/ML) 0.5% IN NEBU
INHALATION_SOLUTION | RESPIRATORY_TRACT | Status: AC
  Administered 2011-12-15: 04:00:00
  Filled 2011-12-14: qty 2

## 2011-12-14 MED ORDER — IPRATROPIUM BROMIDE 0.02 % IN SOLN
RESPIRATORY_TRACT | Status: AC
  Administered 2011-12-15: 04:00:00
  Filled 2011-12-14: qty 2.5

## 2011-12-14 NOTE — ED Notes (Signed)
Per EMS, pt was found 40 minutes ago with generalized weakness. She was unable to walk by herself. Weak but equal grip strengths. Positive bilateral arm drift. She had a previous CVA in the beginning of this month. Recently hospitalized for COPD exacerbation. Bilateral wheezing in lungs. CBG 245. Vitals: 150/80, 45, A-fib, 100% on nebulizer treatment.  10mg  Albulterol, 0.5 Atrovent, 125 mg Solumedrol IV. 20g L hand with 500 NS bag attached. Pt is hoarse for about 3 weeks.

## 2011-12-15 ENCOUNTER — Other Ambulatory Visit: Payer: Self-pay

## 2011-12-15 ENCOUNTER — Emergency Department (HOSPITAL_COMMUNITY): Payer: Medicare Other

## 2011-12-15 ENCOUNTER — Encounter (HOSPITAL_COMMUNITY): Payer: Self-pay | Admitting: Radiology

## 2011-12-15 DIAGNOSIS — N179 Acute kidney failure, unspecified: Secondary | ICD-10-CM | POA: Diagnosis present

## 2011-12-15 DIAGNOSIS — E875 Hyperkalemia: Secondary | ICD-10-CM | POA: Diagnosis present

## 2011-12-15 LAB — BASIC METABOLIC PANEL
CO2: 23 mEq/L (ref 19–32)
CO2: 23 mEq/L (ref 19–32)
CO2: 26 mEq/L (ref 19–32)
Calcium: 8.8 mg/dL (ref 8.4–10.5)
Calcium: 8.4 mg/dL (ref 8.4–10.5)
Chloride: 106 mEq/L (ref 96–112)
Chloride: 112 mEq/L (ref 96–112)
Chloride: 109 mEq/L (ref 96–112)
Creatinine, Ser: 1.67 mg/dL — ABNORMAL HIGH (ref 0.50–1.10)
GFR calc Af Amer: 42 mL/min — ABNORMAL LOW (ref >90)
GFR calc Af Amer: 45 mL/min — ABNORMAL LOW (ref >90)
GFR calc Af Amer: 51 mL/min — ABNORMAL LOW (ref >90)
GFR calc Af Amer: 51 mL/min — ABNORMAL LOW (ref >90)
GFR calc non Af Amer: 36 mL/min — ABNORMAL LOW (ref >90)
GFR calc non Af Amer: 39 mL/min — ABNORMAL LOW (ref >90)
GFR calc non Af Amer: 44 mL/min — ABNORMAL LOW (ref >90)
Potassium: 7.4 mEq/L (ref 3.5–5.1)
Potassium: 5.3 mEq/L — ABNORMAL HIGH (ref 3.5–5.1)
Potassium: 5 mEq/L (ref 3.5–5.1)
Sodium: 140 mEq/L (ref 135–145)
Sodium: 143 mEq/L (ref 135–145)
Sodium: 145 mEq/L (ref 135–145)
Sodium: 142 mEq/L (ref 135–145)

## 2011-12-15 LAB — GLUCOSE, CAPILLARY
Glucose-Capillary: 336 mg/dL — ABNORMAL HIGH (ref 70–99)
Glucose-Capillary: 336 mg/dL — ABNORMAL HIGH (ref 70–99)
Glucose-Capillary: 134 mg/dL — ABNORMAL HIGH (ref 70–99)
Glucose-Capillary: 104 mg/dL — ABNORMAL HIGH (ref 70–99)

## 2011-12-15 LAB — COMPREHENSIVE METABOLIC PANEL
Albumin: 3.3 g/dL — ABNORMAL LOW (ref 3.5–5.2)
BUN: 90 mg/dL — ABNORMAL HIGH (ref 6–23)
Calcium: 9 mg/dL (ref 8.4–10.5)
Creatinine, Ser: 2.15 mg/dL — ABNORMAL HIGH (ref 0.50–1.10)
Total Protein: 7.1 g/dL (ref 6.0–8.3)

## 2011-12-15 LAB — DIFFERENTIAL
Basophils Absolute: 0 10*3/uL (ref 0.0–0.1)
Basophils Relative: 0 % (ref 0–1)
Eosinophils Absolute: 0.3 10*3/uL (ref 0.0–0.7)
Monocytes Absolute: 0.5 10*3/uL (ref 0.1–1.0)
Monocytes Relative: 7 % (ref 3–12)

## 2011-12-15 LAB — MRSA PCR SCREENING: MRSA by PCR: NEGATIVE

## 2011-12-15 LAB — URINALYSIS, ROUTINE W REFLEX MICROSCOPIC
Glucose, UA: 250 mg/dL — AB
Nitrite: NEGATIVE
Specific Gravity, Urine: 1.011 (ref 1.005–1.030)
pH: 6 (ref 5.0–8.0)

## 2011-12-15 LAB — POTASSIUM: Potassium: >7.5 mEq/L (ref 3.5–5.1)

## 2011-12-15 LAB — PRO B NATRIURETIC PEPTIDE: Pro B Natriuretic peptide (BNP): 1068 pg/mL — ABNORMAL HIGH (ref 0–125)

## 2011-12-15 LAB — CBC
HCT: 32.9 % — ABNORMAL LOW (ref 36.0–46.0)
Hemoglobin: 10.7 g/dL — ABNORMAL LOW (ref 12.0–15.0)
MCH: 30.1 pg (ref 26.0–34.0)
MCHC: 32.5 g/dL (ref 30.0–36.0)
MCV: 92.4 fL (ref 78.0–100.0)
Platelets: 189 10*3/uL (ref 150–400)
RBC: 3.56 MIL/uL — ABNORMAL LOW (ref 3.87–5.11)
RDW: 14.3 % (ref 11.5–15.5)
WBC: 7.2 10*3/uL (ref 4.0–10.5)

## 2011-12-15 LAB — PROTIME-INR: INR: 1.06 (ref 0.00–1.49)

## 2011-12-15 LAB — URINE MICROSCOPIC-ADD ON

## 2011-12-15 LAB — APTT: aPTT: 33 seconds (ref 24–37)

## 2011-12-15 LAB — TROPONIN I: Troponin I: <0.3 ng/mL (ref <0.30)

## 2011-12-15 MED ORDER — INSULIN ASPART 100 UNIT/ML ~~LOC~~ SOLN
10.0000 [IU] | Freq: Once | SUBCUTANEOUS | Status: AC
  Administered 2011-12-15: 10 [IU] via INTRAVENOUS
  Filled 2011-12-15: qty 1

## 2011-12-15 MED ORDER — ONDANSETRON HCL 4 MG PO TABS: 4.0000 mg | ORAL_TABLET | Freq: Four times a day (QID) | ORAL | Status: DC | PRN

## 2011-12-15 MED ORDER — ACETAMINOPHEN 325 MG PO TABS
ORAL_TABLET | ORAL | Status: AC
  Administered 2011-12-15: 650 mg via ORAL
  Filled 2011-12-15: qty 2

## 2011-12-15 MED ORDER — ACETAMINOPHEN 325 MG PO TABS
650.0000 mg | ORAL_TABLET | Freq: Four times a day (QID) | ORAL | Status: DC | PRN
  Administered 2011-12-15 – 2011-12-19 (×5): 650 mg via ORAL
  Filled 2011-12-15 (×5): qty 2

## 2011-12-15 MED ORDER — INSULIN GLARGINE 100 UNIT/ML ~~LOC~~ SOLN
35.0000 [IU] | Freq: Every day | SUBCUTANEOUS | Status: DC
  Administered 2011-12-15 – 2011-12-16 (×2): 35 [IU] via SUBCUTANEOUS
  Filled 2011-12-15: qty 3

## 2011-12-15 MED ORDER — ONDANSETRON HCL 4 MG/2ML IJ SOLN: 4.0000 mg | Freq: Four times a day (QID) | INTRAMUSCULAR | Status: DC | PRN

## 2011-12-15 MED ORDER — ALBUTEROL SULFATE (5 MG/ML) 0.5% IN NEBU
INHALATION_SOLUTION | RESPIRATORY_TRACT | Status: AC
  Administered 2011-12-15: 5 mg via RESPIRATORY_TRACT
  Filled 2011-12-15: qty 1

## 2011-12-15 MED ORDER — DEXTROSE 50 % IV SOLN
1.0000 | Freq: Once | INTRAVENOUS | Status: AC
  Administered 2011-12-15: 50 mL via INTRAVENOUS
  Filled 2011-12-15: qty 50

## 2011-12-15 MED ORDER — DEXTROSE 50 % IV SOLN: 25.0000 mL | INTRAVENOUS | Status: DC | PRN

## 2011-12-15 MED ORDER — IPRATROPIUM BROMIDE 0.02 % IN SOLN
0.5000 mg | Freq: Four times a day (QID) | RESPIRATORY_TRACT | Status: DC
  Administered 2011-12-15 – 2011-12-16 (×5): 0.5 mg via RESPIRATORY_TRACT
  Filled 2011-12-15 (×5): qty 2.5

## 2011-12-15 MED ORDER — SODIUM POLYSTYRENE SULFONATE 15 GM/60ML PO SUSP
ORAL | Status: AC
  Administered 2011-12-15: 30 g via ORAL
  Filled 2011-12-15: qty 120

## 2011-12-15 MED ORDER — DOCUSATE SODIUM 100 MG PO CAPS
100.0000 mg | ORAL_CAPSULE | Freq: Two times a day (BID) | ORAL | Status: DC
  Administered 2011-12-15 – 2011-12-17 (×5): 100 mg via ORAL
  Filled 2011-12-15 (×11): qty 1

## 2011-12-15 MED ORDER — ALBUTEROL SULFATE (5 MG/ML) 0.5% IN NEBU
INHALATION_SOLUTION | RESPIRATORY_TRACT | Status: AC
  Administered 2011-12-15: 2.5 mg via RESPIRATORY_TRACT
  Filled 2011-12-15: qty 0.5

## 2011-12-15 MED ORDER — ACETAMINOPHEN 650 MG RE SUPP: 650.0000 mg | Freq: Four times a day (QID) | RECTAL | Status: DC | PRN

## 2011-12-15 MED ORDER — SODIUM CHLORIDE 0.9 % IV SOLN
INTRAVENOUS | Status: DC
  Administered 2011-12-15: 1000 mL via INTRAVENOUS

## 2011-12-15 MED ORDER — IPRATROPIUM BROMIDE 0.02 % IN SOLN
0.5000 mg | Freq: Once | RESPIRATORY_TRACT | Status: AC
  Administered 2011-12-15: 0.5 mg via RESPIRATORY_TRACT
  Filled 2011-12-15: qty 2.5

## 2011-12-15 MED ORDER — SODIUM POLYSTYRENE SULFONATE 15 GM/60ML PO SUSP
30.0000 g | Freq: Once | ORAL | Status: AC
  Administered 2011-12-15: 30 g via ORAL

## 2011-12-15 MED ORDER — SODIUM CHLORIDE 0.9 % IJ SOLN
3.0000 mL | Freq: Two times a day (BID) | INTRAMUSCULAR | Status: DC
  Administered 2011-12-15 – 2011-12-19 (×7): 3 mL via INTRAVENOUS

## 2011-12-15 MED ORDER — HEPARIN SODIUM (PORCINE) 5000 UNIT/ML IJ SOLN
5000.0000 [IU] | Freq: Three times a day (TID) | INTRAMUSCULAR | Status: DC
  Administered 2011-12-15 – 2011-12-19 (×13): 5000 [IU] via SUBCUTANEOUS
  Filled 2011-12-15 (×16): qty 1

## 2011-12-15 MED ORDER — INSULIN ASPART 100 UNIT/ML IV SOLN
10.0000 [IU] | INTRAVENOUS | Status: AC
  Administered 2011-12-15: 10 [IU] via INTRAVENOUS
  Filled 2011-12-15: qty 0.1

## 2011-12-15 MED ORDER — ASPIRIN EC 81 MG PO TBEC
81.0000 mg | DELAYED_RELEASE_TABLET | Freq: Every day | ORAL | Status: DC
  Administered 2011-12-15 – 2011-12-19 (×5): 81 mg via ORAL
  Filled 2011-12-15 (×5): qty 1

## 2011-12-15 MED ORDER — INSULIN ASPART 100 UNIT/ML ~~LOC~~ SOLN
0.0000 [IU] | Freq: Three times a day (TID) | SUBCUTANEOUS | Status: DC
  Administered 2011-12-17: 2 [IU] via SUBCUTANEOUS
  Administered 2011-12-17: 3 [IU] via SUBCUTANEOUS
  Administered 2011-12-18: 7 [IU] via SUBCUTANEOUS
  Administered 2011-12-18: 3 [IU] via SUBCUTANEOUS
  Filled 2011-12-15: qty 3

## 2011-12-15 MED ORDER — CLOPIDOGREL BISULFATE 75 MG PO TABS
75.0000 mg | ORAL_TABLET | Freq: Every day | ORAL | Status: DC
  Administered 2011-12-15 – 2011-12-19 (×5): 75 mg via ORAL
  Filled 2011-12-15 (×6): qty 1

## 2011-12-15 MED ORDER — DEXTROSE-NACL 5-0.45 % IV SOLN: INTRAVENOUS | Status: DC

## 2011-12-15 MED ORDER — ISOSORBIDE MONONITRATE ER 30 MG PO TB24
30.0000 mg | ORAL_TABLET | Freq: Every day | ORAL | Status: DC
  Administered 2011-12-15 – 2011-12-17 (×3): 30 mg via ORAL
  Filled 2011-12-15 (×4): qty 1

## 2011-12-15 MED ORDER — SIMVASTATIN 40 MG PO TABS
40.0000 mg | ORAL_TABLET | Freq: Every day | ORAL | Status: DC
  Administered 2011-12-15 – 2011-12-18 (×4): 40 mg via ORAL
  Filled 2011-12-15 (×5): qty 1

## 2011-12-15 MED ORDER — ALBUTEROL SULFATE (5 MG/ML) 0.5% IN NEBU
2.5000 mg | INHALATION_SOLUTION | Freq: Once | RESPIRATORY_TRACT | Status: AC
  Administered 2011-12-15: 2.5 mg via RESPIRATORY_TRACT

## 2011-12-15 MED ORDER — SODIUM CHLORIDE 0.9 % IV BOLUS (SEPSIS)
999.0000 mL | Freq: Once | INTRAVENOUS | Status: AC
  Administered 2011-12-15: 999 mL via INTRAVENOUS

## 2011-12-15 MED ORDER — FLUTICASONE-SALMETEROL 250-50 MCG/DOSE IN AEPB
1.0000 | INHALATION_SPRAY | Freq: Two times a day (BID) | RESPIRATORY_TRACT | Status: DC
  Administered 2011-12-15 – 2011-12-19 (×9): 1 via RESPIRATORY_TRACT
  Filled 2011-12-15: qty 14

## 2011-12-15 MED ORDER — SODIUM CHLORIDE 0.9 % IV SOLN
INTRAVENOUS | Status: DC
  Administered 2011-12-15: 09:00:00 via INTRAVENOUS
  Filled 2011-12-15: qty 1

## 2011-12-15 MED ORDER — SENNA 8.6 MG PO TABS
1.0000 | ORAL_TABLET | Freq: Two times a day (BID) | ORAL | Status: DC
  Administered 2011-12-15 – 2011-12-17 (×5): 8.6 mg via ORAL
  Filled 2011-12-15 (×11): qty 1

## 2011-12-15 MED ORDER — SODIUM CHLORIDE 0.9 % IV SOLN
INTRAVENOUS | Status: DC
  Administered 2011-12-16: 05:00:00 via INTRAVENOUS

## 2011-12-15 MED ORDER — SODIUM BICARBONATE 8.4 % IV SOLN
50.0000 meq | Freq: Once | INTRAVENOUS | Status: AC
  Administered 2011-12-15: 50 meq via INTRAVENOUS
  Filled 2011-12-15: qty 50

## 2011-12-15 MED ORDER — SODIUM POLYSTYRENE SULFONATE 15 GM/60ML PO SUSP
15.0000 g | Freq: Once | ORAL | Status: AC
  Administered 2011-12-15: 15 g via ORAL
  Filled 2011-12-15: qty 60

## 2011-12-15 MED ORDER — INSULIN ASPART 100 UNIT/ML ~~LOC~~ SOLN
SUBCUTANEOUS | Status: AC
  Filled 2011-12-15: qty 1

## 2011-12-15 MED ORDER — INSULIN ASPART 100 UNIT/ML ~~LOC~~ SOLN
0.0000 [IU] | Freq: Three times a day (TID) | SUBCUTANEOUS | Status: DC
  Administered 2011-12-15: 11 [IU] via SUBCUTANEOUS
  Filled 2011-12-15: qty 3

## 2011-12-15 MED ORDER — ACETAMINOPHEN 325 MG PO TABS
650.0000 mg | ORAL_TABLET | Freq: Once | ORAL | Status: AC
  Administered 2011-12-15: 650 mg via ORAL

## 2011-12-15 MED ORDER — METHYLPREDNISOLONE SODIUM SUCC 125 MG IJ SOLR
INTRAMUSCULAR | Status: AC
  Administered 2011-12-15: 04:00:00
  Filled 2011-12-15: qty 2

## 2011-12-15 MED ORDER — SODIUM CHLORIDE 0.9 % IV SOLN
1.0000 g | INTRAVENOUS | Status: AC
  Administered 2011-12-15: 1 g via INTRAVENOUS
  Filled 2011-12-15: qty 10

## 2011-12-15 MED ORDER — CARVEDILOL 25 MG PO TABS
25.0000 mg | ORAL_TABLET | Freq: Two times a day (BID) | ORAL | Status: DC
  Administered 2011-12-15 – 2011-12-19 (×9): 25 mg via ORAL
  Filled 2011-12-15 (×13): qty 1

## 2011-12-15 MED ORDER — SODIUM CHLORIDE 0.9 % IV BOLUS (SEPSIS)
500.0000 mL | Freq: Once | INTRAVENOUS | Status: AC
  Administered 2011-12-15: 500 mL via INTRAVENOUS

## 2011-12-15 MED ORDER — INSULIN REGULAR BOLUS VIA INFUSION
0.0000 [IU] | Freq: Three times a day (TID) | INTRAVENOUS | Status: DC
  Filled 2011-12-15: qty 10

## 2011-12-15 MED ORDER — ALBUTEROL SULFATE (5 MG/ML) 0.5% IN NEBU
5.0000 mg | INHALATION_SOLUTION | Freq: Once | RESPIRATORY_TRACT | Status: AC
  Administered 2011-12-15: 5 mg via RESPIRATORY_TRACT

## 2011-12-15 MED ORDER — ALBUTEROL SULFATE (5 MG/ML) 0.5% IN NEBU
2.5000 mg | INHALATION_SOLUTION | RESPIRATORY_TRACT | Status: AC
  Administered 2011-12-15 (×3): 2.5 mg via RESPIRATORY_TRACT
  Filled 2011-12-15 (×3): qty 0.5

## 2011-12-15 NOTE — ED Notes (Signed)
Spoke with Dr. Lynelle Doctor regarding pt having a bed and no admission orders. Dr. Prefers to keep pt here until admitting ED sees pt. Will continue to monitor.

## 2011-12-15 NOTE — ED Provider Notes (Signed)
History     CSN: [id number]  Arrival date & time 12/14/11  2347   First MD Initiated Contact with Patient 12/14/11 2348      Chief Complaint  Patient presents with  . Weakness    (Consider location/radiation/quality/duration/timing/severity/associated sxs/prior treatment) HPI Patient presents to the emergency with complaints of weakness. She has a complicated history with several medical problems. She was recently in the hospital with a stroke the beginning of this month. Patient states that she had symptoms on the left side of her body but that is mostly had resolved. She noticed today that she had general weakness after going out walking with her daughter. It started sometime this afternoon. Patient states the weakness is in both legs and both arms although it does seem to be greater on the left side. She denies any headache, trouble with her speech, or numbness. She does have history of COPD and states she feels short of breath but this is no different than she usually feels. She has been using her inhalers frequently. Patient was brought in by EMS. They noted wheezing and start her on 10 mg albuterol 0.5 mg Atrovent. She was also given 125 mg of Solu-Medrol IV. She has noted hoarseness as well but that has been ongoing for about 3 weeks Past Medical History  Diagnosis Date  . Diabetes mellitus     on Lantus 30 U   . MI (myocardial infarction)   . CAD (coronary artery disease)   . Heart failure     [place], [place] previous records.   . S/P CABG (coronary artery bypass graft)   . PAD (peripheral artery disease)   . Stroke   . CHF (congestive heart failure)   . Pneumonia   . Hypothyroidism     Past Surgical History  Procedure Date  . Ptca   . Thyroidectomy   . Coronary artery bypass graft     2 valves  . Carotid endarterectomy   . Cholecystectomy, laparoscopic less than 6 months ago  . Cholecystectomy     No family history on file.  History  Substance Use Topics  .  Smoking status: Former Smoker -- 1.0 packs/day for 50 years    Types: Cigarettes    Quit date: 11/25/2010  . Smokeless tobacco: Never Used  . Alcohol Use: No    OB History    Grav Para Term Preterm Abortions TAB SAB Ect Mult Living                  Review of Systems  Constitutional: Negative for fever.  Respiratory: Positive for cough. Negative for chest tightness.   Gastrointestinal: Negative for abdominal distention.  Neurological: Negative for seizures and numbness.  All other systems reviewed and are negative.    Allergies  Crestor; Lisinopril; and Vicodin  Home Medications   Current Outpatient Rx  Name Route Sig Dispense Refill  . ACETAMINOPHEN 500 MG PO TABS Oral Take 500 mg by mouth every 6 (six) hours as needed. For pain    . ALBUTEROL SULFATE HFA 108 (90 BASE) MCG/ACT IN AERS Inhalation Inhale 2 puffs into the lungs every 6 (six) hours as needed. For wheezing    . ALBUTEROL SULFATE (5 MG/ML) 0.5% IN NEBU Nebulization Take 2.5 mg by nebulization 4 (four) times daily.    . ASPIRIN EC 81 MG PO TBEC Oral Take 81 mg by mouth daily.    Marland Kitchen BLOOD GLUCOSE MONITORING SUPPL W/DEVICE KIT  Use to check blood sugar 2 times  daily 1 each 0  . CARVEDILOL 25 MG PO TABS Oral Take 25 mg by mouth 2 (two) times daily with a meal.    . CLOPIDOGREL BISULFATE 75 MG PO TABS Oral Take 75 mg by mouth daily.    Marland Kitchen FLUTICASONE-SALMETEROL 250-50 MCG/DOSE IN AEPB Inhalation Inhale 1 puff into the lungs 2 (two) times daily. 60 each 1  . FUROSEMIDE 40 MG PO TABS Oral Take 40 mg by mouth 2 (two) times daily.    . INSULIN ASPART 100 UNIT/ML [place] SOLN Subcutaneous Inject 5 Units into the skin 3 (three) times daily before meals.    . INSULIN GLARGINE 100 UNIT/ML [place] SOLN Subcutaneous Inject 35 Units into the skin at bedtime.    . IPRATROPIUM BROMIDE 0.02 % IN SOLN Nebulization Take 0.5 mg by nebulization 4 (four) times daily.    . ISOSORBIDE MONONITRATE ER 60 MG PO TB24 Oral Take 30 mg by mouth every morning.      Marland Kitchen LOSARTAN POTASSIUM 100 MG PO TABS Oral Take 1 tablet (100 mg total) by mouth daily. 30 tablet 1  . MOMETASONE FUROATE 50 MCG/ACT NA SUSP Nasal Place 2 sprays into the nose at bedtime.    Marland Kitchen NITROGLYCERIN 0.4 MG SL SUBL Sublingual Place 0.4 mg under the tongue every 5 (five) minutes as needed. For chest pain    . POTASSIUM CHLORIDE CRYS ER 20 MEQ PO TBCR Oral Take 20 mEq by mouth 2 (two) times daily.    Marland Kitchen PREGABALIN 50 MG PO CAPS Oral Take 1 capsule (50 mg total) by mouth 2 (two) times daily. 60 capsule 1  . SIMVASTATIN 40 MG PO TABS Oral Take 40 mg by mouth at bedtime.    . SPIRONOLACTONE 25 MG PO TABS Oral Take 1 tablet (25 mg total) by mouth daily. 30 tablet 1  . TRAMADOL HCL 50 MG PO TABS Oral Take 50 mg by mouth every 6 (six) hours as needed. For pain      There were no vitals taken for this visit.  Physical Exam  Nursing note and vitals reviewed. Constitutional: She is oriented to person, place, and time. No distress.  HENT:  Head: Normocephalic and atraumatic.  Right Ear: External ear normal.  Left Ear: External ear normal.  Mouth/Throat: Oropharynx is clear and moist.  Eyes: Conjunctivae are normal. Right eye exhibits no discharge. Left eye exhibits no discharge. No scleral icterus.  Neck: Neck supple. No tracheal deviation present.  Cardiovascular: Normal rate and intact distal pulses.        Extrasystoles  Pulmonary/Chest: Effort normal. No stridor. No respiratory distress. She has wheezes. She has no rales.       Course upper airway inspiratory sounds  Abdominal: Soft. Bowel sounds are normal. She exhibits no distension. There is no tenderness. There is no rebound and no guarding.  Musculoskeletal: She exhibits no edema and no tenderness.  Neurological: She is alert and oriented to person, place, and time. She has normal strength. No cranial nerve deficit ( no gross defecits noted) or sensory deficit. She exhibits normal muscle tone. She displays no seizure activity.        Patient unable to hold both hands off for 10 seconds, drifting greater on the left side , unable to hold both legs off bed for 5 seconds, sensation intact in all extremities, no visual field cuts, no left or right sided neglect  Skin: Skin is warm and dry. No rash noted.  Psychiatric: She has a normal mood and affect.  ED Course  Procedures (including critical care time)  Date: 12/15/2011  Rate: 43  Rhythm: atrial fibrillation  QRS Axis: left  Intervals: Atrial fibrillation  ST/T Wave abnormalities: Repolarization abnormality  Conduction Disutrbances:right bundle branch block and left anterior fascicular block  Narrative Interpretation:   Old EKG Reviewed: changes noted QRS interval has increased, radius or   Labs Reviewed  CBC - Abnormal; Notable for the following:    RBC 3.56 (*)    Hemoglobin 10.7 (*)    HCT 32.9 (*)    All other components within normal limits  COMPREHENSIVE METABOLIC PANEL - Abnormal; Notable for the following:    Sodium 134 (*)    Potassium >7.5 (*)    Glucose, Bld 253 (*)    BUN 90 (*)    Creatinine, Ser 2.15 (*)    Albumin 3.3 (*)    Alkaline Phosphatase 120 (*)    Total Bilirubin 0.1 (*)    GFR calc non Af Amer 23 (*)    GFR calc Af Amer 27 (*)    All other components within normal limits  PRO B NATRIURETIC PEPTIDE - Abnormal; Notable for the following:    Pro B Natriuretic peptide (BNP) 1068.0 (*)    All other components within normal limits  GLUCOSE, CAPILLARY - Abnormal; Notable for the following:    Glucose-Capillary 267 (*)    All other components within normal limits  PROTIME-INR  APTT  DIFFERENTIAL  TROPONIN I  URINALYSIS, ROUTINE W REFLEX MICROSCOPIC   Ct Head Wo Contrast  12/15/2011  *RADIOLOGY REPORT*  Clinical Data: Generalized weakness; unable to ambulate without assistance.  CT HEAD WITHOUT CONTRAST  Technique:  Contiguous axial images were obtained from the base of the skull through the vertex without contrast.  Comparison:  MRI/MRA of the head performed 11/26/2011 and 11/27/2011  Findings: There is no evidence of acute infarction, mass lesion, or intra- or extra-axial hemorrhage on CT.  Scattered periventricular and subcortical white matter change likely reflects small vessel ischemic microangiopathy.  The posterior fossa, including the cerebellum, brainstem and fourth ventricle, is within normal limits.  The third and lateral ventricles, and basal ganglia are unremarkable in appearance.  The cerebral hemispheres demonstrate grossly normal gray-white differentiation.  No mass effect or midline shift is seen.  There is no evidence of fracture; visualized osseous structures are unremarkable in appearance.  The visualized portions of the orbits are within normal limits.  The paranasal sinuses and mastoid air cells are well-aerated.  No significant soft tissue abnormalities are seen.  IMPRESSION:  1.  No acute intracranial pathology seen on CT. 2.  Scattered small vessel ischemic microangiopathy.  Original Report Authenticated By: Tonia Ghent, M.D.   Dg Chest Portable 1 View  12/15/2011  *RADIOLOGY REPORT*  Clinical Data: Weakness.  PORTABLE CHEST - 1 VIEW  Comparison: Chest radiograph performed 12/01/2011  Findings: Worsening bibasilar airspace opacification is noted, concerning for pneumonia.  The appearance is atypical for edema, though mild underlying vascular congestion is noted.  No pleural effusion or pneumothorax is seen.  The cardiomediastinal silhouette is mildly enlarged; the patient is status post median sternotomy.  No acute osseous abnormalities are identified.  IMPRESSION:  1.  Worsening bibasilar airspace opacification, concerning for pneumonia. 2.  Underlying vascular congestion and mild cardiomegaly noted; the appearance is atypical for edema.  Original Report Authenticated By: Tonia Ghent, M.D.     1. Acute renal failure   2. Hyperkalemia       MDM  Patient presents  to the emergency room with complaints  of weakness but appears to be a result of her new onset renal failure and hyperkalemia.  Patient is on several antihypertensive agents, diuretics and potassium supplementation. Her renal failure is new compared to previous labs on file. She has a severely elevated potassium of greater than 7.5. Her EKG does indicate a widening of the QRS complex. I have ordered sodium bicarbonate, calcium gluconate, D50 and insulin to treat this critical potassium level. Patient will be admitted to the hospitalist service. She will need to be monitored closely.  CRITICAL CARE Performed by: Celene Kras  ?  Total critical care time:30  Critical care time was exclusive of separately billable procedures and treating other patients.  Critical care was necessary to treat or prevent imminent or life-threatening deterioration.  Critical care was time spent personally by me on the following activities: development of treatment plan with patient and/or surrogate as well as nursing, discussions with consultants, evaluation of patient's response to treatment, examination of patient, obtaining history from patient or surrogate, ordering and performing treatments and interventions, ordering and review of laboratory studies, ordering and review of radiographic studies, pulse oximetry and re-evaluation of patient's condition.         Celene Kras, MD 12/15/11 0130

## 2011-12-15 NOTE — Progress Notes (Signed)
PT Cancellation Note  Evaluation cancelled today due to medical issues with patient which prohibited therapy. Patient remains on bed rest. Nursing report that with current medical condition including significant hyperkalemia that this is appropriate at this time. MD please update activity orders when you feel patient is appropriate to mobilize.  Edwyna Perfect, PT  Pager [phone]  12/15/2011, 11:46 AM

## 2011-12-15 NOTE — ED Notes (Signed)
Dr. Lynelle Doctor made aware of critical potassium level.

## 2011-12-15 NOTE — H&P (Signed)
PCP:   Cala Bradford, MD, MD Confirmed  Chief Complaint:  Weakness  HPI: 72yoF with h/o DM/HTN, CAD s/p CABG, stenting, chronic systolic HF, TIA vs stroke  01/2011, s/p left CEA 2008 now presents in acute renal failure and severe hyperkalemia,  likely medication effect from recently increased Lasix, recently started  Spironolactone, and oral potassium supplements.   The pt was admitted to IM teaching service twice this month, however bc she has been  set up with [facility] as her PCP and this admission wasn't felt related to two prior  admissions, [facility] contacted for admission. First admission, she was brought in for  dizziness, vision changes, gait abnlms, and MRI showed subacute infarct in right  occipital lobe and MRA showed at least moderate focal stenosis in left vertebral  artery. Carotids showed 40-60% right and 60-80% left. Echo with EF 30-35%, diffuse  hypokinesis and inferior hyopkinesis. Sent home on ASA/plavix. She was then re-admitted  on 2/7-2/9 with shortness of breath with wheezing since discharge from the hospital.  EMS had been going to her house nightly to give her nebulizers. SOB was felt likely due  to CHF. She had minimally increased Troponins that were just trended. Lasix was  increased to 40 mg BID and spironolactone started. She was discharged home.   She was seen in the interim by Dr. Cliffton Asters, who has also set her up to see a cardiologist  and pulmonologist next week, per her daughter at bedside. Overall, she's been doing  quite well since last discharge, without much complaint until last night, when the  patient complained of generalized weakness in her lower extremities, and left hand  numbness and weakness. She was barely able to walk. Currently, she states that her  symptoms are much better since treatment.   In the ED, vitals were stable. Labs showed hypoNa 134, hyperK >7.5, renal failure  90/2.15 with last values 24/0.99, hyperglycemia 267. Trop negative,  BNP decreased to  1068. CBC stable. UA without overt infection. CXR with worsened bibasilar  opacification, concerning for PNA, and underlying vascular congestion and mild  cardiomegaly, atypical for PNA. CT head was negative for acute process. Pt has been  given tylenol, albuterol nebs, ca gluconate 1g, dextrose and insulin 10u x2, kayexalate  15g, NS 500 bolus, sodium bicarb 50 mEq, methylprednisolone 125mg .   The daughter states she was increased from 40 Lasix daily to 40 twice daily, endorses  compliance with the spironolactone and potassium. She has been getting in good PO  intake including fluids. They deny any chest pain, SOB, dizziness, lightheadedness, GI  issues of n/v/d/abd pain, or any other complaints and as above was doing well since  last discharge.   Past Medical History  Diagnosis Date  . Diabetes mellitus     on Lantus 30 U   . MI (myocardial infarction)   . CAD (coronary artery disease)     S/p CABG and stenting  . S/P CABG (coronary artery bypass graft)   . PAD (peripheral artery disease)   . Stroke     MRI 11/2011 with remote occipital lobe. MRA with moderate left focal vertebral artery stenosis  . CHF (congestive heart failure) 11/2011    Echo with EF 30-35%, global hypokinesis, and inferior akinesis  . Pneumonia   . Hypothyroidism     Past Surgical History  Procedure Date  . Ptca   . Thyroidectomy   . Coronary artery bypass graft     2 vessel  . Carotid endarterectomy ~2008  Left   . Cholecystectomy, laparoscopic less than 6 months ago  . Cholecystectomy    Medications:  HOME MEDS: Reconciled by name with the patient's daughter  Prior to Admission medications   Medication Sig Start Date End Date Taking? Authorizing Provider  acetaminophen (TYLENOL) 500 MG tablet Take 500 mg by mouth every 6 (six) hours as needed. For pain   Yes Historical Provider, MD  albuterol (PROVENTIL HFA;VENTOLIN HFA) 108 (90 BASE) MCG/ACT inhaler Inhale 2 puffs into the  lungs every 6 (six) hours as needed. For wheezing   Yes Historical Provider, MD  albuterol (PROVENTIL) (5 MG/ML) 0.5% nebulizer solution Take 2.5 mg by nebulization 4 (four) times daily. 11/28/11  Yes Blanca Friend, MD  aspirin EC 81 MG tablet Take 81 mg by mouth daily.   Yes Historical Provider, MD  carvedilol (COREG) 25 MG tablet Take 25 mg by mouth 2 (two) times daily with a meal. 11/28/11  Yes Blanca Friend, MD  clopidogrel (PLAVIX) 75 MG tablet Take 75 mg by mouth daily. 11/28/11  Yes Blanca Friend, MD  Fluticasone-Salmeterol (ADVAIR) 250-50 MCG/DOSE AEPB Inhale 1 puff into the lungs 2 (two) times daily. 12/03/11  Yes Leodis Sias, MD  furosemide (LASIX) 40 MG tablet Take 40 mg by mouth 2 (two) times daily. 11/28/11  Yes Blanca Friend, MD  insulin aspart (NOVOLOG) 100 UNIT/ML injection Inject 5 Units into the skin 3 (three) times daily before meals. 11/28/11  Yes Kristie Cowman, MD  insulin glargine (LANTUS) 100 UNIT/ML injection Inject 35 Units into the skin at bedtime. 11/28/11  Yes Kristie Cowman, MD  ipratropium (ATROVENT) 0.02 % nebulizer solution Take 0.5 mg by nebulization 4 (four) times daily. 11/28/11 11/27/12 Yes Blanca Friend, MD  isosorbide mononitrate (IMDUR) 60 MG 24 hr tablet Take 30 mg by mouth every morning.    Yes Historical Provider, MD  loperamide (IMODIUM) 2 MG capsule Take 4 mg by mouth 4 (four) times daily as needed. For diarrhea   Yes Historical Provider, MD  losartan (COZAAR) 100 MG tablet Take 1 tablet (100 mg total) by mouth daily. 12/03/11 12/02/12 Yes Leodis Sias, MD  mometasone (NASONEX) 50 MCG/ACT nasal spray Place 2 sprays into the nose at bedtime. 11/28/11 11/27/12 Yes Leodis Sias, MD  potassium chloride SA (K-DUR,KLOR-CON) 20 MEQ tablet Take 20 mEq by mouth 2 (two) times daily. 11/28/11  Yes Blanca Friend, MD  pregabalin (LYRICA) 50 MG capsule Take 1 capsule (50 mg total) by mouth 2 (two) times daily. 12/03/11 12/02/12 Yes Leodis Sias, MD  simvastatin  (ZOCOR) 40 MG tablet Take 40 mg by mouth at bedtime. 11/28/11  Yes Blanca Friend, MD  spironolactone (ALDACTONE) 25 MG tablet Take 1 tablet (25 mg total) by mouth daily. 12/03/11 12/02/12 Yes Leodis Sias, MD  Blood Glucose Monitoring Suppl W/DEVICE KIT Use to check blood sugar 2 times daily 11/28/11   Leodis Sias, MD  nitroGLYCERIN (NITROSTAT) 0.4 MG SL tablet Place 0.4 mg under the tongue every 5 (five) minutes as needed. For chest pain    Historical Provider, MD  traMADol (ULTRAM) 50 MG tablet Take 50 mg by mouth every 6 (six) hours as needed. For pain    Historical Provider, MD    Allergies:  Allergies  Allergen Reactions  . Crestor (Rosuvastatin Calcium)     Muscle Pain  . Lisinopril Other (See Comments)    "Lowers her BP too low"  . Vicodin (Hydrocodone-Acetaminophen) Nausea And Vomiting    Social History:   reports that she quit smoking about  12 months ago. Her smoking use included Cigarettes. She has a 50 pack-year smoking history. She has never used smokeless tobacco. She reports that she does not drink alcohol or use illicit drugs.  Retired Advertising account executive.  Worked at the [employer] on [employer].  Formerly lived in [place] area.  2 daughters, Living here with her daughter in [place] starting end of January 2013. [name] [phone] [id number]. Ambulatory previously with a cane, but none now.  Family History: No family history on file.  Physical Exam: Filed Vitals:   12/15/11 0105 12/15/11 0333 12/15/11 0411 12/15/11 0500  BP:   125/53 191/84  Pulse:  76 75 81  Temp: 97.5 F (36.4 C)     TempSrc:      Resp:  18 20 20   SpO2:   97% 100%   Blood pressure 191/84, pulse 81, temperature 97.5 F (36.4 C), temperature source Oral, resp. rate 20, SpO2 100.00%.  Gen: Middle aged appearing, overweight but not obese F, sleeping in ED stretcher but  does awaken to voice with a start, then dozes off and history gathered from daughter at  bedside. When awake, she can  give history and states she needs to use the bathroom. No  respiratory distress. Overall appears stable HEENT: Pupils round, reactive, EOMI, sclera clear and normal. Mouth is moist and normal  appearing Lungs: With light inspiratory coarse breath sounds with some light expiratory wheezes,  not overwhelming though, and no crackles. Good air movement Heart: regular, not tachycardic, but faint S1/2 and a bit hard to hear. No gross  murmurs appreciated Abd: Overweight, but soft NT ND, benign exam Extrem: Warm, perfusing normally, normal bulk and tone, no BLE edema noted.  Neuro: Alert and follows conversation when awoken, answers correctly. CN 2-12 intact,  moves extremities on her own, can sit up in bed on her own. Grossly non-focal   Labs & Imaging Results for orders placed during the hospital encounter of 12/14/11 (from the past 48 hour(s))  PROTIME-INR     Status: Normal   Collection Time   12/15/11 12:03 AM      Component Value Range Comment   Prothrombin Time 14.0  11.6 - 15.2 (seconds)    INR 1.06  0.00 - 1.49    APTT     Status: Normal   Collection Time   12/15/11 12:03 AM      Component Value Range Comment   aPTT 33  24 - 37 (seconds)   CBC     Status: Abnormal   Collection Time   12/15/11 12:03 AM      Component Value Range Comment   WBC 7.2  4.0 - 10.5 (K/uL)    RBC 3.56 (*) 3.87 - 5.11 (MIL/uL)    Hemoglobin 10.7 (*) 12.0 - 15.0 (g/dL)    HCT 16.1 (*) 09.6 - 46.0 (%)    MCV 92.4  78.0 - 100.0 (fL)    MCH 30.1  26.0 - 34.0 (pg)    MCHC 32.5  30.0 - 36.0 (g/dL)    RDW 04.5  40.9 - 81.1 (%)    Platelets 189  150 - 400 (K/uL)   DIFFERENTIAL     Status: Normal   Collection Time   12/15/11 12:03 AM      Component Value Range Comment   Neutrophils Relative 66  43 - 77 (%)    Neutro Abs 4.8  1.7 - 7.7 (K/uL)    Lymphocytes Relative 23  12 - 46 (%)  Lymphs Abs 1.6  0.7 - 4.0 (K/uL)    Monocytes Relative 7  3 - 12 (%)    Monocytes Absolute 0.5  0.1 - 1.0 (K/uL)     Eosinophils Relative 4  0 - 5 (%)    Eosinophils Absolute 0.3  0.0 - 0.7 (K/uL)    Basophils Relative 0  0 - 1 (%)    Basophils Absolute 0.0  0.0 - 0.1 (K/uL)   COMPREHENSIVE METABOLIC PANEL     Status: Abnormal   Collection Time   12/15/11 12:03 AM      Component Value Range Comment   Sodium 134 (*) 135 - 145 (mEq/L)    Potassium >7.5 (*) 3.5 - 5.1 (mEq/L)    Chloride 102  96 - 112 (mEq/L)    CO2 27  19 - 32 (mEq/L)    Glucose, Bld 253 (*) 70 - 99 (mg/dL)    BUN 90 (*) 6 - 23 (mg/dL)    Creatinine, Ser 7.25 (*) 0.50 - 1.10 (mg/dL)    Calcium 9.0  8.4 - 10.5 (mg/dL)    Total Protein 7.1  6.0 - 8.3 (g/dL)    Albumin 3.3 (*) 3.5 - 5.2 (g/dL)    AST 13  0 - 37 (U/L)    ALT 13  0 - 35 (U/L)    Alkaline Phosphatase 120 (*) 39 - 117 (U/L)    Total Bilirubin 0.1 (*) 0.3 - 1.2 (mg/dL)    GFR calc non Af Amer 23 (*) >90 (mL/min)    GFR calc Af Amer 27 (*) >90 (mL/min)   PRO B NATRIURETIC PEPTIDE     Status: Abnormal   Collection Time   12/15/11 12:04 AM      Component Value Range Comment   Pro B Natriuretic peptide (BNP) 1068.0 (*) 0 - 125 (pg/mL)   TROPONIN I     Status: Normal   Collection Time   12/15/11 12:11 AM      Component Value Range Comment   Troponin I <0.30  <0.30 (ng/mL)   GLUCOSE, CAPILLARY     Status: Abnormal   Collection Time   12/15/11 12:50 AM      Component Value Range Comment   Glucose-Capillary 267 (*) 70 - 99 (mg/dL)   URINALYSIS, ROUTINE W REFLEX MICROSCOPIC     Status: Abnormal   Collection Time   12/15/11  2:15 AM      Component Value Range Comment   Color, Urine YELLOW  YELLOW     APPearance CLOUDY (*) CLEAR     Specific Gravity, Urine 1.011  1.005 - 1.030     pH 6.0  5.0 - 8.0     Glucose, UA 250 (*) NEGATIVE (mg/dL)    Hgb urine dipstick TRACE (*) NEGATIVE     Bilirubin Urine NEGATIVE  NEGATIVE     Ketones, ur NEGATIVE  NEGATIVE (mg/dL)    Protein, ur NEGATIVE  NEGATIVE (mg/dL)    Urobilinogen, UA 0.2  0.0 - 1.0 (mg/dL)    Nitrite NEGATIVE  NEGATIVE      Leukocytes, UA SMALL (*) NEGATIVE    URINE MICROSCOPIC-ADD ON     Status: Abnormal   Collection Time   12/15/11  2:15 AM      Component Value Range Comment   Squamous Epithelial / LPF FEW (*) RARE     WBC, UA 0-2  <3 (WBC/hpf)    RBC / HPF 0-2  <3 (RBC/hpf)    Bacteria, UA FEW (*) RARE  POTASSIUM     Status: Abnormal   Collection Time   12/15/11  2:44 AM      Component Value Range Comment   Potassium >7.5 (*) 3.5 - 5.1 (mEq/L)    Ct Head Wo Contrast  12/15/2011  *RADIOLOGY REPORT*  Clinical Data: Generalized weakness; unable to ambulate without assistance.  CT HEAD WITHOUT CONTRAST  Technique:  Contiguous axial images were obtained from the base of the skull through the vertex without contrast.  Comparison: MRI/MRA of the head performed 11/26/2011 and 11/27/2011  Findings: There is no evidence of acute infarction, mass lesion, or intra- or extra-axial hemorrhage on CT.  Scattered periventricular and subcortical white matter change likely reflects small vessel ischemic microangiopathy.  The posterior fossa, including the cerebellum, brainstem and fourth ventricle, is within normal limits.  The third and lateral ventricles, and basal ganglia are unremarkable in appearance.  The cerebral hemispheres demonstrate grossly normal gray-white differentiation.  No mass effect or midline shift is seen.  There is no evidence of fracture; visualized osseous structures are unremarkable in appearance.  The visualized portions of the orbits are within normal limits.  The paranasal sinuses and mastoid air cells are well-aerated.  No significant soft tissue abnormalities are seen.  IMPRESSION:  1.  No acute intracranial pathology seen on CT. 2.  Scattered small vessel ischemic microangiopathy.  Original Report Authenticated By: Tonia Ghent, M.D.   Dg Chest Portable 1 View  12/15/2011  *RADIOLOGY REPORT*  Clinical Data: Weakness.  PORTABLE CHEST - 1 VIEW  Comparison: Chest radiograph performed 12/01/2011   Findings: Worsening bibasilar airspace opacification is noted, concerning for pneumonia.  The appearance is atypical for edema, though mild underlying vascular congestion is noted.  No pleural effusion or pneumothorax is seen.  The cardiomediastinal silhouette is mildly enlarged; the patient is status post median sternotomy.  No acute osseous abnormalities are identified.  IMPRESSION:  1.  Worsening bibasilar airspace opacification, concerning for pneumonia. 2.  Underlying vascular congestion and mild cardiomegaly noted; the appearance is atypical for edema.  Original Report Authenticated By: Tonia Ghent, M.D.   11/2011 Study Conclusions - Left ventricle: The cavity size was normal. There was mild concentric hypertrophy. Systolic function was moderately to severely reduced. The estimated ejection fraction was in the range of 30% to 35%. Moderate diffuse hypokinesis. Akinesis of the inferior myocardium. Doppler parameters are consistent with abnormal left ventricular relaxation (grade 1 diastolic dysfunction). - Mitral valve: Moderately calcified annulus. - Left atrium: The atrium was mildly to moderately dilated.  ECG on arrival: Consistent with severe hyperK, with very wide QRS 134, no P waves  noticeable although RR intervals are consistent, and peaking of T waves. V1 and V3 ST  elevation. ST depression in high lateral leads in a strain pattern. Prior ECG has  narrow QRS, obvious P waves, normal T waves, although lateral and high lateral STD and  TWI.   Most recent ECG: now with QRS 104, back in sinus with notable P waves. T waves still  appear peaked a bit but better. Lateral strain pattern still present.   Impression Present on Admission:  .Hyperkalemia .Acute renal failure .Chronic systolic heart failure .Diabetes mellitus .COPD (chronic obstructive pulmonary disease)  65yoF with h/o DM/HTN, CAD s/p CABG, stenting, chronic systolic HF, TIA vs stroke  01/2011, s/p left CEA 2008 now  presents in acute renal failure and severe hyperkalemia,  likely medication effect from recently increased Lasix, recently started  Spironolactone, and oral potassium supplements.   1. ARF  and severe hyperK: Cr is 2.15 up from baseline in 11/2011 of 0.7-0.9. K is now  >7.5 x2 in the ED despite treatment, however the very worrisome ECG changes she  previously had are resolving. She is s/p calcium gluconate, bicarb, insulin/dextrose,  kayexalate.   - Admit to SDU, give continuous IVF's, trend BMET's q3 x4. Hold on further Kayexalate  for now, was given 15g x3 in ED. Scheduled albuterol q4 x3 for now. Renal diet. Trend  ECG's. Will not give any Lasix - If not improving, she'll need renal consultation   2. CAD s/p CABG, chronic sCHF: Not active issue at present. No reported chest pain, BNP  improved, and Trop negative x1, no symptoms.  - Continue ASA 81 mg daily, coreg, plavix, statin, imdur. Holding PO Lasix, cozaar,  spironolactone  3. Diabetes: Continue home Lantus 35u HS and will add moderate SSI, holding the 5u  before meals home dose. She is s/p 10u IV insulin x2.   4. COPD: continue home advair 5. Peripheral neuropathy: holding home lyrica for renal failure  SDU, team 2 Full code, discussed   Other plans as per orders.  Ryler Laskowski 12/15/2011, 5:30 AM

## 2011-12-15 NOTE — ED Notes (Signed)
EDP made aware of critical potassium level.

## 2011-12-15 NOTE — Progress Notes (Signed)
12/15/11  HgbA1C was 15.2% on 11-26-11 on previous admission.  Needs follow up with primary care physician.  Takes Lantus 35 units daily at home.  Question whether patient is following diabetes medications regimen on a daily basis. Will continue to follow while in hospital.  Will need a discharge regimen for diabetes medications and DM education that can be followed at home.  Smith Mince RN BSN

## 2011-12-15 NOTE — Progress Notes (Signed)
This is a 66 y/o female who was taking start on Cozaar, Aldactone and Lasix (dose had been doubled recently) for CHF and HTN.  She now presents with ARF and hyperkalemia. She is noted to be lethargic this AM when examined.  K+ level has coming down 6.0.  She was treated in there ER with Calcium Gluconate, Bicarb, Insluin/dextrose and Kayexalate. I have further placed her on an Insulin infusion (for hyperkalemia) and I am continuing Q4hr Albuterol nebs.  We will continue to monitor her K and her mental status carefully in the SDU. I have changed her to NPO until more alert.  We will need to closely follow pulmonary status while hydrating (EF 30%).   Calvert Cantor, MD [phone]

## 2011-12-15 NOTE — ED Notes (Signed)
Respiratory notified about wheezing protocol. Will continue to monitor.

## 2011-12-15 NOTE — ED Notes (Signed)
Pt. Resting on stretcher. B/S obtained. Pt's daughter given warm blanket for comfort. No other needs voiced.

## 2011-12-15 NOTE — ED Notes (Signed)
Pt. C/o headache. Nurse notified of same.

## 2011-12-15 NOTE — Progress Notes (Signed)
Utilization Review Completed.Lleyton Byers T2/21/2013   

## 2011-12-16 LAB — GLUCOSE, CAPILLARY
Glucose-Capillary: 63 mg/dL — ABNORMAL LOW (ref 70–99)
Glucose-Capillary: 102 mg/dL — ABNORMAL HIGH (ref 70–99)
Glucose-Capillary: 167 mg/dL — ABNORMAL HIGH (ref 70–99)

## 2011-12-16 MED ORDER — IPRATROPIUM-ALBUTEROL 18-103 MCG/ACT IN AERO
2.0000 | INHALATION_SPRAY | Freq: Four times a day (QID) | RESPIRATORY_TRACT | Status: DC
  Administered 2011-12-17 – 2011-12-19 (×12): 2 via RESPIRATORY_TRACT
  Filled 2011-12-16: qty 14.7

## 2011-12-16 MED ORDER — ALBUTEROL SULFATE (5 MG/ML) 0.5% IN NEBU
2.5000 mg | INHALATION_SOLUTION | Freq: Four times a day (QID) | RESPIRATORY_TRACT | Status: DC
  Administered 2011-12-16: 2.5 mg via RESPIRATORY_TRACT
  Filled 2011-12-16: qty 0.5

## 2011-12-16 MED ORDER — ALBUTEROL SULFATE (5 MG/ML) 0.5% IN NEBU
2.5000 mg | INHALATION_SOLUTION | Freq: Four times a day (QID) | RESPIRATORY_TRACT | Status: DC | PRN
  Administered 2011-12-16 (×2): 2.5 mg via RESPIRATORY_TRACT
  Filled 2011-12-16 (×2): qty 0.5

## 2011-12-16 MED ORDER — SODIUM CHLORIDE 0.45 % IV SOLN
INTRAVENOUS | Status: DC
  Administered 2011-12-16: 75 mL/h via INTRAVENOUS

## 2011-12-16 MED ORDER — IPRATROPIUM BROMIDE 0.02 % IN SOLN
0.5000 mg | Freq: Four times a day (QID) | RESPIRATORY_TRACT | Status: DC
  Administered 2011-12-16: 0.5 mg via RESPIRATORY_TRACT
  Filled 2011-12-16: qty 2.5

## 2011-12-16 NOTE — Progress Notes (Signed)
[facility] [place] TEAM 8  Subjective: 65yoF with h/o DM/HTN, CAD s/p CABG, stenting, chronic systolic HF, TIA vs stroke  01/2011, s/p left CEA 2008 now presents in acute renal failure and severe hyperkalemia,  likely medication effect from recently increased Lasix, recently started  Spironolactone, and oral potassium supplements.   Today she is resting comfortably in her bed.  She has no specific complaints.  She denies fevers chills nausea vomiting or chest pain.  Objective: Weight change: 0.7 kg (1 lb 8.7 oz)  Intake/Output Summary (Last 24 hours) at 12/16/11 1146 Last data filed at 12/16/11 0800  Gross per 24 hour  Intake   1098 ml  Output   1850 ml  Net   -752 ml   Blood pressure 123/82, pulse 80, temperature 97.8 F (36.6 C), temperature source Oral, resp. rate 22, height 5\' 2"  (1.575 m), weight 70.5 kg (155 lb 6.8 oz), SpO2 100.00%.  Physical Exam: General: No acute respiratory distress Lungs: Clear to auscultation bilaterally without wheezes or crackles Cardiovascular: Regular rate and rhythm without murmur gallop or rub  Abdomen: Nontender, nondistended, soft, bowel sounds positive, no rebound, no ascites, no appreciable mass Extremities: No significant cyanosis, clubbing, or edema bilateral lower extremities  Lab Results:  [facility] 12/15/11 2124 12/15/11 1821 12/15/11 1342  NA 142 145 143  K 5.2* 5.0 5.3*  CL 109 112 111  CO2 26 26 25   GLUCOSE 170* 85 173*  BUN 61* 65* 74*  CREATININE 1.26* 1.25* 1.38*  CALCIUM 8.4 8.8 8.8  MG -- -- --  PHOS -- -- --    Basename 12/15/11 0003  AST 13  ALT 13  ALKPHOS 120*  BILITOT 0.1*  PROT 7.1  ALBUMIN 3.3*    Basename 12/15/11 0003  WBC 7.2  NEUTROABS 4.8  HGB 10.7*  HCT 32.9*  MCV 92.4  PLT 189    Basename 12/15/11 0011  CKTOTAL --  CKMB --  CKMBINDEX --  TROPONINI <0.30   Micro Results: Recent Results (from the past 240 hour(s))  MRSA PCR SCREENING     Status: Normal   Collection Time     12/15/11  6:09 AM      Component Value Range Status Comment   MRSA by PCR NEGATIVE  NEGATIVE  Final     Studies/Results: All recent x-ray/radiology reports have been reviewed in detail.   Medications: I have reviewed the patient's complete medication list.  Assessment/Plan:  Hyperkalemia Potassium was greater than 7.5 at presentation-it is presently hovering around 5.5-we will continue to watch in a serial fashion  Acute renal failure Baseline creatinine is reportedly 0.7-0.9 - we will continue to gently hydrate and follow trend  subacute infarct R occipital lobe w/ moderate focal stenosis left vertebral artery Diagnosed during recent hospital stay-placed on aspirin and Plavix-no clinical evidence of exacerbation or extension  Severely uncontrolled Diabetes mellitus Recent hemoglobin A1c was 15.2 - CBG currently well-controlled  Coronary artery disease status post coronary artery bypass graft with stents No complaints of chest pain  Peripheral arterial disease No clinical evidence of acute complications  Chronic systolic congestive heart failure Ejection fraction 30-35% with global hypokinesis via echocardiogram this month - we'll need to determine home diuretic dose based upon the extent of renal function recovery  Hypothyroidism status post thyroidectomy  COPD Well compensated at present  Hypertension Reasonably well controlled at present-follow trend  Disposition Stable for transfer to telemetry bed  Lonia Blood, MD [facility] Office  [phone] Pager [phone]  On-Call/Text Page:      [name]      password [email or web address]

## 2011-12-16 NOTE — Progress Notes (Signed)
PT Cancellation Note  Evaluation cancelled today due to pt on bedrest (strict bedrest and PT eval ordered at the same time).  Await clarification of activity orders.Marland Kitchen  Shakhia Gramajo 12/16/2011, 2:39 PM Pager [phone]

## 2011-12-16 NOTE — Progress Notes (Signed)
12/16/2011 0030 Patient's BP is 178/72 and 160/80 manually. Sent Maren Reamer, NP a text page. Orders to recheck in 4 hours. If systolic > 170, notify.   [place] Batoul Limes, RN

## 2011-12-16 NOTE — Progress Notes (Signed)
Patient more hypertensive with associated shortness of breath. Will decrease IVF to 50cc/hr and defer transfer to telemetry for now.

## 2011-12-16 NOTE — Progress Notes (Signed)
Heart Failure Core Measure:   ECHO 11/26/10- EF 30-35%  ARB prior to admission- has currently been held secondary to acute renal failure.   Link Snuffer, PharmD, BCPS Clinical Pharmacist.  12/16/2011, 2:07 PM

## 2011-12-17 LAB — GLUCOSE, CAPILLARY
Glucose-Capillary: 71 mg/dL (ref 70–99)
Glucose-Capillary: 189 mg/dL — ABNORMAL HIGH (ref 70–99)
Glucose-Capillary: 169 mg/dL — ABNORMAL HIGH (ref 70–99)
Glucose-Capillary: 134 mg/dL — ABNORMAL HIGH (ref 70–99)

## 2011-12-17 LAB — BASIC METABOLIC PANEL
BUN: 30 mg/dL — ABNORMAL HIGH (ref 6–23)
CO2: 28 mEq/L (ref 19–32)
Chloride: 110 mEq/L (ref 96–112)
Creatinine, Ser: 1.04 mg/dL (ref 0.50–1.10)
Glucose, Bld: 79 mg/dL (ref 70–99)

## 2011-12-17 MED ORDER — IPRATROPIUM-ALBUTEROL 18-103 MCG/ACT IN AERO
2.0000 | INHALATION_SPRAY | RESPIRATORY_TRACT | Status: DC | PRN
  Administered 2011-12-18 (×2): 2 via RESPIRATORY_TRACT
  Filled 2011-12-17: qty 14.7

## 2011-12-17 MED ORDER — ISOSORBIDE MONONITRATE ER 60 MG PO TB24
60.0000 mg | ORAL_TABLET | Freq: Every day | ORAL | Status: DC
  Administered 2011-12-18 – 2011-12-19 (×2): 60 mg via ORAL
  Filled 2011-12-17 (×4): qty 1

## 2011-12-17 MED ORDER — DIPHENHYDRAMINE HCL 25 MG PO CAPS
25.0000 mg | ORAL_CAPSULE | Freq: Every evening | ORAL | Status: DC | PRN
  Administered 2011-12-17 – 2011-12-18 (×2): 25 mg via ORAL
  Filled 2011-12-17 (×2): qty 1

## 2011-12-17 MED ORDER — INSULIN GLARGINE 100 UNIT/ML ~~LOC~~ SOLN
30.0000 [IU] | Freq: Every day | SUBCUTANEOUS | Status: DC
  Administered 2011-12-17 – 2011-12-18 (×2): 30 [IU] via SUBCUTANEOUS
  Filled 2011-12-17: qty 3

## 2011-12-17 MED ORDER — DIPHENHYDRAMINE HCL 25 MG PO CAPS: 25.0000 mg | ORAL_CAPSULE | Freq: Four times a day (QID) | ORAL | Status: DC | PRN

## 2011-12-17 NOTE — Progress Notes (Signed)
Pt transferred to [id number] per MD order. Report called to receiving nurse and all questions answered. Pt belongings transferred to new room. Family aware of transfer. Foley was removed prior to transfer per MD order at 1215.

## 2011-12-17 NOTE — Progress Notes (Signed)
[facility] [place] TEAM 8  Subjective: 65yoF with h/o DM/HTN, CAD s/p CABG, stenting, chronic systolic HF, TIA vs stroke  01/2011, s/p left CEA 2008 now presents in acute renal failure and severe hyperkalemia,  likely medication effect from recently increased Lasix, recently started  Spironolactone, and oral potassium supplements.   Denies any complaints.  Objective: Weight change: -1.5 kg (-3 lb 4.9 oz)  Intake/Output Summary (Last 24 hours) at 12/17/11 1610 Last data filed at 12/17/11 0800  Gross per 24 hour  Intake    650 ml  Output   3200 ml  Net  -2550 ml   Blood pressure 146/91, pulse 72, temperature 97.8 F (36.6 C), temperature source Oral, resp. rate 17, height 5\' 2"  (1.575 m), weight 69 kg (152 lb 1.9 oz), SpO2 100.00%.  Physical Exam: General: No acute respiratory distress Lungs: Clear to auscultation bilaterally without wheezes or crackles Cardiovascular: Regular rate and rhythm without murmur gallop or rub  Abdomen: Nontender, nondistended, soft, bowel sounds positive, no rebound, no ascites, no appreciable mass Extremities: No significant cyanosis, clubbing, or edema bilateral lower extremities  Lab Results:  Basename 12/17/11 0430 12/15/11 2124 12/15/11 1821  NA 143 142 145  K 4.4 5.2* 5.0  CL 110 109 112  CO2 28 26 26   GLUCOSE 79 170* 85  BUN 30* 61* 65*  CREATININE 1.04 1.26* 1.25*  CALCIUM 9.3 8.4 8.8  MG -- -- --  PHOS -- -- --    Basename 12/15/11 0003  AST 13  ALT 13  ALKPHOS 120*  BILITOT 0.1*  PROT 7.1  ALBUMIN 3.3*    Basename 12/15/11 0003  WBC 7.2  NEUTROABS 4.8  HGB 10.7*  HCT 32.9*  MCV 92.4  PLT 189    Basename 12/15/11 0011  CKTOTAL --  CKMB --  CKMBINDEX --  TROPONINI <0.30   Micro Results: Recent Results (from the past 240 hour(s))  MRSA PCR SCREENING     Status: Normal   Collection Time   12/15/11  6:09 AM      Component Value Range Status Comment   MRSA by PCR NEGATIVE  NEGATIVE  Final      Studies/Results: All recent x-ray/radiology reports have been reviewed in detail.   Medications:    . albuterol-ipratropium  2 puff Inhalation Q6H  . aspirin EC  81 mg Oral Daily  . carvedilol  25 mg Oral BID WC  . clopidogrel  75 mg Oral QAC breakfast  . docusate sodium  100 mg Oral BID  . Fluticasone-Salmeterol  1 puff Inhalation BID  . heparin  5,000 Units Subcutaneous Q8H  . insulin aspart  0-9 Units Subcutaneous TID WC  . insulin glargine  30 Units Subcutaneous QHS  . isosorbide mononitrate  60 mg Oral QAC breakfast  . senna  1 tablet Oral BID  . simvastatin  40 mg Oral q1800  . sodium chloride  3 mL Intravenous Q12H  . DISCONTD: albuterol  2.5 mg Nebulization QID  . DISCONTD: insulin glargine  35 Units Subcutaneous QHS  . DISCONTD: ipratropium  0.5 mg Nebulization QID  . DISCONTD: isosorbide mononitrate  30 mg Oral QAC breakfast    Assessment/Plan:  Hyperkalemia -Potassium was greater than 7.5 at presentation -Normal potassium level now  Acute renal failure - Baseline creatinine is reportedly 0.7-0.9  - Resolved, this is likely secondary to dehydration and effects of diuretics.  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 -Diagnosed during recent hospital stay-placed on  aspirin and Plavix -No clinical evidence of exacerbation or extension  Severely uncontrolled Diabetes mellitus Recent hemoglobin A1c was 15.2 - CBG currently well-controlled  Coronary artery disease status post coronary artery bypass graft with stents No complaints of chest pain  Peripheral arterial disease No clinical evidence of acute complications  Chronic systolic congestive heart failure -Ejection fraction 30-35% with global hypokinesis via echocardiogram this month  -Likely all discharge on decreased dose of Lasix, and I'll discontinue the Aldactone.  Hypothyroidism status post thyroidectomy  COPD Well compensated at  present  Hypertension Blood pressure was in the high side, because of calling of some medication. Increased the Imdur.  Disposition Stable for transfer to telemetry bed   Clint Lipps Pager: [phone] 12/17/2011, 9:22 AM

## 2011-12-17 NOTE — Progress Notes (Signed)
Physical Therapy Evaluation Patient Details Name: Keyanna Sandefer MRN: [id number] DOB: [birth year] Today's Date: 12/17/2011  Problem List:  Patient Active Problem List  Diagnoses  . History of MI (myocardial infarction)  . CAD (coronary artery disease)  . Chronic systolic heart failure  . S/P CABG (coronary artery bypass graft)  . PAD (peripheral artery disease)  . Hx-TIA (transient ischemic attack)  . Diabetes mellitus  . Hypertension  . Hyperlipidemia  . CVA (cerebral infarction)  . COPD (chronic obstructive pulmonary disease)  . H/O hyperthyroidism s/p thyroidectomy  . Peripheral neuropathy  . SOB (shortness of breath)  . Hyperkalemia  . Acute renal failure    Past Medical History:  Past Medical History  Diagnosis Date  . Diabetes mellitus     on Lantus 30 U   . MI (myocardial infarction)   . CAD (coronary artery disease)     S/p CABG and stenting  . S/P CABG (coronary artery bypass graft)   . PAD (peripheral artery disease)   . Stroke     MRI 11/2011 with remote occipital lobe. MRA with moderate left focal vertebral artery stenosis  . CHF (congestive heart failure) 11/2011    Echo with EF 30-35%, global hypokinesis, and inferior akinesis  . Pneumonia   . Hypothyroidism    Past Surgical History:  Past Surgical History  Procedure Date  . Ptca   . Thyroidectomy   . Coronary artery bypass graft     2 vessel  . Carotid endarterectomy ~2008    Left   . Cholecystectomy, laparoscopic less than 6 months ago  . Cholecystectomy     PT Assessment/Plan/Recommendation PT Assessment Clinical Impression Statement: Pt presents with hyperkalemia and decreased endurance. Pt has no functional mobility deficits. Pt may benefit from extended pulmonary rehabilitation. Pt was educated on energy conservation techniques as well as pursed lip breathing.  PT Recommendation/Assessment: Patent does not need any further PT services No Skilled PT: Patient at baseline level of  functioning;All education completed;Patient will have necessary level of assist by caregiver at discharge PT Recommendation Follow Up Recommendations: No PT follow up;Supervision - Intermittent Equipment Recommended: None recommended by PT PT Goals     PT Evaluation Precautions/Restrictions  Restrictions Weight Bearing Restrictions: No Prior Functioning  Home Living Lives With: Daughter Receives Help From: Family Type of Home: Apartment Home Layout: Other (Comment) (3rd floor) Home Access: Stairs to enter Entrance Stairs-Rails: Right Entrance Stairs-Number of Steps: 20+ Bathroom Shower/Tub: Tub/shower unit;Curtain Firefighter: Standard Bathroom Accessibility: Yes How Accessible: Accessible via walker Home Adaptive Equipment: Straight cane Prior Function Level of Independence: Needs assistance with tranfers Able to Take Stairs?: Yes Driving: No Vocation: Retired Producer, television/film/video: Awake/alert Overall Cognitive Status: Appears within functional limits for tasks assessed Orientation Level: Oriented X4 Sensation/Coordination   Extremity Assessment RLE Assessment RLE Assessment: Within Functional Limits LLE Assessment LLE Assessment: Within Functional Limits Mobility (including Balance) Bed Mobility Bed Mobility: No Transfers Transfers: Yes Sit to Stand: 6: Modified independent (Device/Increase time) Stand to Sit: 6: Modified independent (Device/Increase time) Ambulation/Gait Ambulation/Gait: Yes Ambulation/Gait Assistance: 6: Modified independent (Device/Increase time) Ambulation Distance (Feet): 50 Feet (distance limited by O2 ctube) Assistive device: None Gait Pattern: Within Functional Limits Gait velocity: Normal gait speed Stairs: No (unable secondary to O2)    Exercise    End of Session PT - End of Session Equipment Utilized During Treatment: Gait belt Activity Tolerance: Patient tolerated treatment well Patient left: in  bed;with call bell in reach;with family/visitor present Nurse Communication: Mobility  status for transfers;Mobility status for ambulation General Behavior During Session: [facility] for tasks performed Cognition: [facility] for tasks performed  Milana Kidney 12/17/2011, 4:06 PM  12/17/2011 Milana Kidney DPT PAGER: [phone] OFFICE: [phone]

## 2011-12-18 ENCOUNTER — Inpatient Hospital Stay (HOSPITAL_COMMUNITY): Payer: Medicare Other

## 2011-12-18 LAB — BASIC METABOLIC PANEL
Calcium: 9.6 mg/dL (ref 8.4–10.5)
GFR calc non Af Amer: 54 mL/min — ABNORMAL LOW (ref >90)
Glucose, Bld: 43 mg/dL — CL (ref 70–99)
Potassium: 4.4 mEq/L (ref 3.5–5.1)
Sodium: 144 mEq/L (ref 135–145)

## 2011-12-18 LAB — GLUCOSE, CAPILLARY
Glucose-Capillary: 77 mg/dL (ref 70–99)
Glucose-Capillary: 212 mg/dL — ABNORMAL HIGH (ref 70–99)

## 2011-12-18 MED ORDER — FUROSEMIDE 40 MG PO TABS
40.0000 mg | ORAL_TABLET | Freq: Two times a day (BID) | ORAL | Status: DC
  Administered 2011-12-18 – 2011-12-19 (×3): 40 mg via ORAL
  Filled 2011-12-18 (×5): qty 1

## 2011-12-18 MED ORDER — GLUCOSE 40 % PO GEL
ORAL | Status: AC
  Administered 2011-12-18: 37.5 g
  Filled 2011-12-18: qty 1

## 2011-12-18 NOTE — Progress Notes (Signed)
[facility] [place] TEAM 8  Subjective: [name] with h/o DM/HTN, CAD s/p CABG, stenting, chronic systolic HF, TIA vs stroke  01/2011, s/p left CEA 2008 now presents in acute renal failure and severe hyperkalemia,  likely medication effect from recently increased Lasix, recently started  Spironolactone, and oral potassium supplements.   Denies any complaints.  Objective: Weight change: 0.3 kg (10.6 oz)  Intake/Output Summary (Last 24 hours) at 12/18/11 1046 Last data filed at 12/18/11 1014  Gross per 24 hour  Intake   1320 ml  Output    959 ml  Net    361 ml   Blood pressure 174/61, pulse 81, temperature 97.1 F (36.2 C), temperature source Oral, resp. rate 19, height 5\' 2"  (1.575 m), weight 69.3 kg (152 lb 12.5 oz), SpO2 100.00%.  Physical Exam: General: No acute respiratory distress Lungs: Clear to auscultation bilaterally without wheezes or crackles Cardiovascular: Regular rate and rhythm without murmur gallop or rub  Abdomen: Nontender, nondistended, soft, bowel sounds positive, no rebound, no ascites, no appreciable mass Extremities: No significant cyanosis, clubbing, or edema bilateral lower extremities  Lab Results:  Basename 12/18/11 0625 12/17/11 0430 12/15/11 2124  NA 144 143 142  K 4.4 4.4 5.2*  CL 109 110 109  CO2 28 28 26   GLUCOSE 43* 79 170*  BUN 27* 30* 61*  CREATININE 1.06 1.04 1.26*  CALCIUM 9.6 9.3 8.4  MG -- -- --  PHOS -- -- --   No results found for this basename: AST:2,ALT:2,ALKPHOS:2,BILITOT:2,PROT:2,ALBUMIN:2 in the last 72 hours No results found for this basename: WBC:3,NEUTROABS:3,HGB:3,HCT:3,MCV:3,PLT:3 in the last 72 hours No results found for this basename: CKTOTAL:3,CKMB:3,CKMBINDEX:3,TROPONINI:3 in the last 72 hours Micro Results: Recent Results (from the past 240 hour(s))  MRSA PCR SCREENING     Status: Normal   Collection Time   12/15/11  6:09 AM      Component Value Range Status Comment   MRSA by PCR NEGATIVE  NEGATIVE   Final     Studies/Results: All recent x-ray/radiology reports have been reviewed in detail.   Medications:    . albuterol-ipratropium  2 puff Inhalation Q6H  . aspirin EC  81 mg Oral Daily  . carvedilol  25 mg Oral BID WC  . clopidogrel  75 mg Oral QAC breakfast  . dextrose      . docusate sodium  100 mg Oral BID  . Fluticasone-Salmeterol  1 puff Inhalation BID  . heparin  5,000 Units Subcutaneous Q8H  . insulin aspart  0-9 Units Subcutaneous TID WC  . insulin glargine  30 Units Subcutaneous QHS  . isosorbide mononitrate  60 mg Oral QAC breakfast  . senna  1 tablet Oral BID  . simvastatin  40 mg Oral q1800  . sodium chloride  3 mL Intravenous Q12H    Assessment/Plan:  Hyperkalemia -Potassium was greater than 7.5 at presentation -Normal potassium level now  Acute renal failure - Baseline creatinine is reportedly 0.7-0.9  - Resolved, this is likely secondary to dehydration and effects of diuretics.  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 -I will decrease the Lantus insulin.  Coronary artery disease status post coronary artery bypass graft with stents No complaints of chest pain  Peripheral arterial disease No clinical evidence of  acute complications  Chronic systolic congestive heart failure -Ejection fraction 30-35% with global hypokinesis via echocardiogram this month  -Likely all discharge on decreased dose of Lasix, and I'll discontinue the Aldactone. -Patient started to have some shortness of breath will restart her Lasix.  Hypothyroidism status post thyroidectomy  COPD Well compensated at present  Hypertension Blood pressure was in the high side,  because of calling of some medication. Increased the Imdur.  Disposition Likely can be discharged home in the morning   Clint Lipps Pager: [phone] 12/18/2011, 10:46 AM

## 2011-12-19 LAB — GLUCOSE, CAPILLARY
Glucose-Capillary: 62 mg/dL — ABNORMAL LOW (ref 70–99)
Glucose-Capillary: 72 mg/dL (ref 70–99)
Glucose-Capillary: 81 mg/dL (ref 70–99)

## 2011-12-19 LAB — BASIC METABOLIC PANEL
BUN: 32 mg/dL — ABNORMAL HIGH (ref 6–23)
CO2: 30 mEq/L (ref 19–32)
Chloride: 106 mEq/L (ref 96–112)
Glucose, Bld: 88 mg/dL (ref 70–99)
Potassium: 4.6 mEq/L (ref 3.5–5.1)
Sodium: 143 mEq/L (ref 135–145)

## 2011-12-19 MED ORDER — INSULIN GLARGINE 100 UNIT/ML ~~LOC~~ SOLN: 25.0000 [IU] | Freq: Every day | SUBCUTANEOUS | Status: DC

## 2011-12-19 MED ORDER — FUROSEMIDE 40 MG PO TABS: 40.0000 mg | ORAL_TABLET | Freq: Every day | ORAL | Status: DC

## 2011-12-19 NOTE — Progress Notes (Signed)
Pt. discharged to home after d/c summary reviewed and pt capable of re verbalizing medications and follow up appointments. Pt remains stable. No signs and symptoms of distress. Educated to return to ER in the event of SOB, dizziness, chest pain, or fainting. Kullen Tomasetti, RN  

## 2011-12-19 NOTE — Significant Event (Signed)
CBG: 62  Treatment: 15 GM carbohydrate snack  Symptoms: None  Follow-up CBG: [id number] CBG Result:72  Possible Reasons for Event: Unknown  Comments/MD notified:  Laverda Sorenson RN

## 2011-12-19 NOTE — Progress Notes (Signed)
HOSPITAL DISCHARGE SUMMARY  [name]  MRN: [id number]  DOB:Oct 20, 1946  Date of Admission: 12/14/2011 Date of Discharge: 12/19/2011         LOS: 5 days   Attending Physician:Addy Mcmannis A  Patient's WUJ:WJXBJ,YNWGNFA S, MD, MD  Consults: None  Discharge Diagnoses: Present on Admission:  .Hyperkalemia .Acute renal failure .Chronic systolic heart failure .Diabetes mellitus .COPD (chronic obstructive pulmonary disease) .CAD (coronary artery disease) .Hypertension .H/O hyperthyroidism s/p thyroidectomy   Medication List  As of 12/19/2011 10:18 AM   STOP taking these medications         potassium chloride SA 20 MEQ tablet      spironolactone 25 MG tablet         TAKE these medications         acetaminophen 500 MG tablet   Commonly known as: TYLENOL   Take 500 mg by mouth every 6 (six) hours as needed. For pain      albuterol 108 (90 BASE) MCG/ACT inhaler   Commonly known as: PROVENTIL HFA;VENTOLIN HFA   Inhale 2 puffs into the lungs every 6 (six) hours as needed. For wheezing      albuterol (5 MG/ML) 0.5% nebulizer solution   Commonly known as: PROVENTIL   Take 2.5 mg by nebulization 4 (four) times daily.      aspirin EC 81 MG tablet   Take 81 mg by mouth daily.      Blood Glucose Monitoring Suppl W/DEVICE Kit   Use to check blood sugar 2 times daily      carvedilol 25 MG tablet   Commonly known as: COREG   Take 25 mg by mouth 2 (two) times daily with a meal.      clopidogrel 75 MG tablet   Commonly known as: PLAVIX   Take 75 mg by mouth daily.      Fluticasone-Salmeterol 250-50 MCG/DOSE Aepb   Commonly known as: ADVAIR   Inhale 1 puff into the lungs 2 (two) times daily.      furosemide 40 MG tablet   Commonly known as: LASIX   Take 1 tablet (40 mg total) by mouth daily.      insulin aspart 100 UNIT/ML injection   Commonly known as: novoLOG   Inject 5 Units into the skin 3 (three) times daily before meals.      insulin glargine 100 UNIT/ML  injection   Commonly known as: LANTUS   Inject 25 Units into the skin at bedtime.      ipratropium 0.02 % nebulizer solution   Commonly known as: ATROVENT   Take 0.5 mg by nebulization 4 (four) times daily.      isosorbide mononitrate 60 MG 24 hr tablet   Commonly known as: IMDUR   Take 30 mg by mouth every morning.      loperamide 2 MG capsule   Commonly known as: IMODIUM   Take 4 mg by mouth 4 (four) times daily as needed. For diarrhea      losartan 100 MG tablet   Commonly known as: COZAAR   Take 1 tablet (100 mg total) by mouth daily.      mometasone 50 MCG/ACT nasal spray   Commonly known as: NASONEX   Place 2 sprays into the nose at bedtime.      nitroGLYCERIN 0.4 MG SL tablet   Commonly known as: NITROSTAT   Place 0.4 mg under the tongue every 5 (five) minutes as needed. For chest pain      pregabalin 50 MG  capsule   Commonly known as: LYRICA   Take 1 capsule (50 mg total) by mouth 2 (two) times daily.      simvastatin 40 MG tablet   Commonly known as: ZOCOR   Take 40 mg by mouth at bedtime.      traMADol 50 MG tablet   Commonly known as: ULTRAM   Take 50 mg by mouth every 6 (six) hours as needed. For pain             Brief Admission History: 65yoF with h/o DM/HTN, CAD s/p CABG, stenting, chronic systolic HF, TIA vs stroke 01/2011, s/p left CEA 2008 now presents in acute renal failure and severe hyperkalemia, likely medication effect from recently increased Lasix, recently started Spironolactone, and oral potassium supplements. The pt was admitted to IM teaching service twice this month, however bc she has been set up with [facility] as her PCP and this admission wasn't felt related to two prior admissions, [facility] contacted for admission. 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 Carotids showed 40-60%  right and 60-80% left. Echo with EF 30-35%, diffuse hypokinesis and inferior hyopkinesis. Sent home on ASA/plavix. She was then re-admitted on 2/7-2/9 with shortness of breath with wheezing since discharge from the hospital. EMS had been going to her house nightly to give her nebulizers. SOB was felt likely due to CHF. She had minimally increased Troponins that were just trended. Lasix was increased to 40 mg BID and spironolactone started. She was discharged home. She was seen in the interim by Dr. Cliffton Asters, who has also set her up to see a cardiologist and pulmonologist next week, per her daughter at bedside. Overall, she's been doing quite well since last discharge, without much complaint until last night, when the patient complained of generalized weakness in her lower extremities, and left hand numbness and weakness. She was barely able to walk. Currently, she states that her symptoms are much better since treatment. In the ED, vitals were stable. Labs showed hypoNa 134, hyperK >7.5, renal failure 90/2.15 with last values 24/0.99, hyperglycemia 267. Trop negative, BNP decreased to 1068. CBC stable. UA without overt infection. CXR with worsened bibasilar opacification, concerning for PNA, and underlying vascular congestion and mild cardiomegaly, atypical for PNA. CT head was negative for acute process. Pt has been given tylenol, albuterol nebs, ca gluconate 1g, dextrose and insulin 10u x2, kayexalate 15g, NS 500 bolus, sodium bicarb 50 mEq, methylprednisolone 125mg . The daughter states she was increased from 40 Lasix daily to 40 twice daily, endorses compliance with the spironolactone and potassium. She has been getting in good PO intake including fluids. They deny any chest pain, SOB, dizziness, lightheadedness, GI issues of n/v/d/abd pain, or any other complaints and as above was doing well since last discharge.   Hospital Course: Present on Admission:  .Hyperkalemia .Acute renal failure .Chronic systolic  heart failure .Diabetes mellitus .COPD (chronic obstructive pulmonary disease) .CAD (coronary artery disease) .Hypertension .H/O hyperthyroidism s/p thyroidectomy  1. Hyperkalemia: Patient did have hyperkalemia, potassium level was more than 7.5 with EKG changes with widening of the QRS complex. Patient admitted to the ICU at the time of presentation. She was given calcium gluconate, dextrose and insulin 10 units x2 and sodium bicarbonate. Patient also was given sodium bicarbonate. The hyperkalemia is multifactorial secondary to acute renal failure, potassium supplementation, spironolactone and lastly losartan.  Hyperkalemia resolved. At the time of discharge potassium level is 4.6 but patient will not be on potassium supplementation. EKG changes resolved.  2. Acute renal failure: This is likely secondary to overdiuresis. Patient was on 40 mg of Lasix daily this was increased last time she was in the hospital here to 40 mg twice a day and Aldactone also introduced to her regimen. Patient also was on ARB along with the diuretics. At the time of discharge she'll be only on 40 mg of Lasix daily with Cozaar. Special attention to the renal function should be done with close monitoring of her BMP. Patient should follow up with her primary care physician within one week.  3. Chronic systolic heart failure: LVEF of 33% on 11/27/2011. She is likely NYHA class 2-3. Patient needs followup with cardiologist for further adjustment of her regimen. She is on Coreg, Cozaar, Imdur and Lasix.  4. Diabetes mellitus type 2: Patient was on a 35 units of Lantus insulin and it was noted in the hospital that she has morning hypoglycemia, she had a nadir of 39 for her CBG. Her Lantus she came to 25 units and 5 units of NovoLog with meals.  5. HTN/CAD: Hypertension medication same as CHF medications, patient also on aspirin and Plavix. As mentioned above the patient needs referral to a local cardiologist as she just moved from  [place].   Day of Discharge BP 175/78  Pulse 79  Temp(Src) 97.6 F (36.4 C) (Oral)  Resp 20  Ht 5\' 2"  (1.575 m)  Wt 68.266 kg (150 lb 8 oz)  BMI 27.53 kg/m2  SpO2 100% Physical Exam: GEN: No acute distress, cooperative with exam PSYCH: He is alert and oriented x4; does not appear anxious does not appear depressed; affect is normal  HEENT: Mucous membranes pink and anicteric;  Mouth: without oral thrush or lesions Eyes: PERRLA; EOM intact;  Neck: no cervical lymphadenopathy nor thyromegaly or carotid bruit; no JVD;  CHEST WALL: No tenderness, symmetrical to breathing bilaterally CHEST: Normal respiration, clear to auscultation bilaterally  HEART: Regular rate and rhythm; no murmurs, rubs or gallops, S1 and S2 heard  BACK: No kyphosis or scoliosis; no CVA tenderness  ABDOMEN:  soft non-tender; no masses, no organomegaly, normal abdominal bowel sounds; no pannus; no intertriginous candida.  EXTREMITIES: No bone or joint deformity; no edema; no ulcerations.  PULSES: 2+ and symmetric, neurovascularity is intact SKIN: Normal hydration no rash or ulceration, no flushing or suspicious lesions  CNS: Cranial nerves 2-12 grossly intact no focal neurologic deficit, coordination is intact gait not tested    Results for orders placed during the hospital encounter of 12/14/11 (from the past 24 hour(s))  GLUCOSE, CAPILLARY     Status: Abnormal   Collection Time   12/18/11 11:28 AM      Component Value Range   Glucose-Capillary 225 (*) 70 - 99 (mg/dL)  PRO B NATRIURETIC PEPTIDE     Status: Abnormal   Collection Time   12/18/11 11:42 AM      Component Value Range   Pro B Natriuretic peptide (BNP) 7071.0 (*) 0 - 125 (pg/mL)  GLUCOSE, CAPILLARY     Status: Abnormal   Collection Time   12/18/11  5:19 PM      Component Value Range   Glucose-Capillary 348 (*) 70 - 99 (mg/dL)  GLUCOSE, CAPILLARY     Status: Abnormal   Collection Time   12/18/11  9:37 PM      Component Value Range  Glucose-Capillary 212 (*) 70 - 99 (mg/dL)   Comment 1 Notify RN    BASIC METABOLIC PANEL     Status: Abnormal   Collection Time   12/19/11  6:15 AM      Component Value Range   Sodium 143  135 - 145 (mEq/L)   Potassium 4.6  3.5 - 5.1 (mEq/L)   Chloride 106  96 - 112 (mEq/L)   CO2 30  19 - 32 (mEq/L)   Glucose, Bld 88  70 - 99 (mg/dL)   BUN 32 (*) 6 - 23 (mg/dL)   Creatinine, Ser 6.96 (*) 0.50 - 1.10 (mg/dL)   Calcium 9.9  8.4 - 29.5 (mg/dL)   GFR calc non Af Amer 50 (*) >90 (mL/min)   GFR calc Af Amer 58 (*) >90 (mL/min)  GLUCOSE, CAPILLARY     Status: Abnormal   Collection Time   12/19/11  8:05 AM      Component Value Range   Glucose-Capillary 62 (*) 70 - 99 (mg/dL)  GLUCOSE, CAPILLARY     Status: Normal   Collection Time   12/19/11  8:46 AM      Component Value Range   Glucose-Capillary 72  70 - 99 (mg/dL)    Disposition: Home   Follow-up Appts: Discharge Orders    Future Appointments: Provider: Department: Dept Phone: Center:   01/02/2012 11:30 AM Barbaraann Share, MD [facility] [phone] None   02/02/2012 1:30 PM [facility] [phone] [facility]   02/02/2012 2:30 PM Sherren Kerns, MD [facility] [phone] [facility]     Future Orders Please Complete By Expires   Diet - low sodium heart healthy      Increase activity slowly         Follow-up Information    Follow up with Cala Bradford, MD. Schedule an appointment as soon as possible for a visit in 1 week.   Contact information:   [address] [place] [address] [phone]          I spent 40 minutes completing paperwork and coordinating discharge efforts.  SignedClydia Llano A 12/19/2011, 10:18 AM

## 2011-12-20 ENCOUNTER — Emergency Department (HOSPITAL_COMMUNITY)
Admission: EM | Admit: 2011-12-20 | Discharge: 2011-12-21 | Disposition: A | Discharge status: Home / Self Care | Payer: Medicare Other | Attending: Emergency Medicine | Admitting: Emergency Medicine

## 2011-12-20 ENCOUNTER — Encounter (HOSPITAL_COMMUNITY): Payer: Self-pay | Admitting: Emergency Medicine

## 2011-12-20 ENCOUNTER — Emergency Department (HOSPITAL_COMMUNITY): Payer: Medicare Other

## 2011-12-20 DIAGNOSIS — J449 Chronic obstructive pulmonary disease, unspecified: Secondary | ICD-10-CM | POA: Insufficient documentation

## 2011-12-20 DIAGNOSIS — E039 Hypothyroidism, unspecified: Secondary | ICD-10-CM | POA: Insufficient documentation

## 2011-12-20 DIAGNOSIS — I252 Old myocardial infarction: Secondary | ICD-10-CM | POA: Insufficient documentation

## 2011-12-20 DIAGNOSIS — Z9889 Other specified postprocedural states: Secondary | ICD-10-CM | POA: Insufficient documentation

## 2011-12-20 DIAGNOSIS — Z79899 Other long term (current) drug therapy: Secondary | ICD-10-CM | POA: Insufficient documentation

## 2011-12-20 DIAGNOSIS — Z951 Presence of aortocoronary bypass graft: Secondary | ICD-10-CM | POA: Insufficient documentation

## 2011-12-20 DIAGNOSIS — Z8701 Personal history of pneumonia (recurrent): Secondary | ICD-10-CM | POA: Insufficient documentation

## 2011-12-20 DIAGNOSIS — J4489 Other specified chronic obstructive pulmonary disease: Secondary | ICD-10-CM | POA: Insufficient documentation

## 2011-12-20 DIAGNOSIS — Z794 Long term (current) use of insulin: Secondary | ICD-10-CM | POA: Insufficient documentation

## 2011-12-20 DIAGNOSIS — Z8673 Personal history of transient ischemic attack (TIA), and cerebral infarction without residual deficits: Secondary | ICD-10-CM | POA: Insufficient documentation

## 2011-12-20 DIAGNOSIS — R0602 Shortness of breath: Secondary | ICD-10-CM | POA: Insufficient documentation

## 2011-12-20 DIAGNOSIS — R490 Dysphonia: Secondary | ICD-10-CM | POA: Insufficient documentation

## 2011-12-20 DIAGNOSIS — E119 Type 2 diabetes mellitus without complications: Secondary | ICD-10-CM | POA: Insufficient documentation

## 2011-12-20 DIAGNOSIS — I509 Heart failure, unspecified: Secondary | ICD-10-CM | POA: Insufficient documentation

## 2011-12-20 DIAGNOSIS — Z87891 Personal history of nicotine dependence: Secondary | ICD-10-CM | POA: Insufficient documentation

## 2011-12-20 DIAGNOSIS — I2510 Atherosclerotic heart disease of native coronary artery without angina pectoris: Secondary | ICD-10-CM | POA: Insufficient documentation

## 2011-12-20 LAB — PRO B NATRIURETIC PEPTIDE: Pro B Natriuretic peptide (BNP): 853.8 pg/mL — ABNORMAL HIGH (ref 0–125)

## 2011-12-20 LAB — CBC
MCV: 92.7 fL (ref 78.0–100.0)
Platelets: 175 10*3/uL (ref 150–400)
RDW: 14.3 % (ref 11.5–15.5)
WBC: 7 10*3/uL (ref 4.0–10.5)

## 2011-12-20 LAB — BASIC METABOLIC PANEL
CO2: 28 mEq/L (ref 19–32)
Calcium: 9.5 mg/dL (ref 8.4–10.5)
Creatinine, Ser: 1.27 mg/dL — ABNORMAL HIGH (ref 0.50–1.10)
GFR calc Af Amer: 50 mL/min — ABNORMAL LOW (ref >90)

## 2011-12-20 MED ORDER — MOXIFLOXACIN HCL IN NACL 400 MG/250ML IV SOLN: 400.0000 mg | Freq: Once | INTRAVENOUS | Status: DC

## 2011-12-20 MED ORDER — ALBUTEROL SULFATE (5 MG/ML) 0.5% IN NEBU
INHALATION_SOLUTION | RESPIRATORY_TRACT | Status: AC
  Administered 2011-12-20: 5 mg
  Filled 2011-12-20: qty 1

## 2011-12-20 MED ORDER — IPRATROPIUM BROMIDE 0.02 % IN SOLN
RESPIRATORY_TRACT | Status: AC
  Administered 2011-12-20: 0.5 mg
  Filled 2011-12-20: qty 2.5

## 2011-12-20 MED ORDER — ALBUTEROL SULFATE (5 MG/ML) 0.5% IN NEBU
INHALATION_SOLUTION | RESPIRATORY_TRACT | Status: AC
  Administered 2011-12-20: 21:00:00
  Filled 2011-12-20: qty 1

## 2011-12-20 MED ORDER — HYDROMORPHONE HCL PF 1 MG/ML IJ SOLN: 1.0000 mg | Freq: Once | INTRAMUSCULAR | Status: DC

## 2011-12-20 MED ORDER — METHYLPREDNISOLONE SODIUM SUCC 125 MG IJ SOLR
INTRAMUSCULAR | Status: AC
  Administered 2011-12-20: 125 mg
  Filled 2011-12-20: qty 2

## 2011-12-20 MED ORDER — MOXIFLOXACIN HCL 400 MG PO TABS: 400.0000 mg | ORAL_TABLET | Freq: Every day | ORAL | Status: DC

## 2011-12-20 NOTE — ED Notes (Signed)
Per EMS; pt was d/c'ed from hospital yesterday, was sitting in chair at home, pt became SOB; on arrival, pt had wheezing bil. Pt already used 2 nebs and her MDI when EMS arrived;pt on 3L at night, and placed it on herself, pt placed on combivent tx, 125 soludmedrol given; per EMS, response to med- able to move air better; pt with dry cough; pt reports feeling better after tx, but still report SOB; BP 170/98 HR 86; 100% on 3L

## 2011-12-20 NOTE — ED Provider Notes (Signed)
History     CSN: [id number]  Arrival date & time 12/20/11  2037   First MD Initiated Contact with Patient 12/20/11 2043      Chief Complaint  Patient presents with  . Shortness of Breath    (Consider location/radiation/quality/duration/timing/severity/associated sxs/prior treatment) Patient is a 66 y.o. female presenting with shortness of breath.  Shortness of Breath  Associated symptoms include shortness of breath.   Patient presents emergent with complaints of shortness of breath. Patient states she was sitting at home when she started to feel short of breath. She used her albuterol inhaler but when the symptoms persisted she called 911. Patient also tried nebulizer treatments. She is on oxygen at home at night. EMS gave the patient albuterol and also 125 Solu-Medrol. Patient states she's been having a dry cough. She has not had any fevers or any swelling. Patient has been in the hospital recently several times in the last month. Of note I did see her within the last week and at that time it was for acute renal failure and hyperkalemia. Patient was just released from the hospital yesterday. Patient states she's also had hoarseness of her voice ongoing since December. Past Medical History  Diagnosis Date  . Diabetes mellitus     on Lantus 30 U   . MI (myocardial infarction)   . CAD (coronary artery disease)     S/p CABG and stenting  . S/P CABG (coronary artery bypass graft)   . PAD (peripheral artery disease)   . Stroke     MRI 11/2011 with remote occipital lobe. MRA with moderate left focal vertebral artery stenosis  . CHF (congestive heart failure) 11/2011    Echo with EF 30-35%, global hypokinesis, and inferior akinesis  . Pneumonia   . Hypothyroidism     Past Surgical History  Procedure Date  . Ptca   . Thyroidectomy   . Coronary artery bypass graft     2 vessel  . Carotid endarterectomy ~2008    Left   . Cholecystectomy, laparoscopic less than 6 months ago  .  Cholecystectomy     History reviewed. No pertinent family history.  History  Substance Use Topics  . Smoking status: Former Smoker -- 1.0 packs/day for 50 years    Types: Cigarettes    Quit date: 11/25/2010  . Smokeless tobacco: Never Used  . Alcohol Use: No    OB History    Grav Para Term Preterm Abortions TAB SAB Ect Mult Living                  Review of Systems  Respiratory: Positive for shortness of breath.   All other systems reviewed and are negative.    Allergies  Crestor; Lisinopril; and Vicodin  Home Medications   Current Outpatient Rx  Name Route Sig Dispense Refill  . ACETAMINOPHEN 500 MG PO TABS Oral Take 500 mg by mouth every 6 (six) hours as needed. For pain    . ALBUTEROL SULFATE HFA 108 (90 BASE) MCG/ACT IN AERS Inhalation Inhale 2 puffs into the lungs every 6 (six) hours as needed. For wheezing    . ALBUTEROL SULFATE (5 MG/ML) 0.5% IN NEBU Nebulization Take 2.5 mg by nebulization 4 (four) times daily.    . ASPIRIN EC 81 MG PO TBEC Oral Take 81 mg by mouth daily.    Marland Kitchen BLOOD GLUCOSE MONITORING SUPPL W/DEVICE KIT  Use to check blood sugar 2 times daily 1 each 0  . CARVEDILOL 25 MG PO  TABS Oral Take 25 mg by mouth 2 (two) times daily with a meal.    . CLOPIDOGREL BISULFATE 75 MG PO TABS Oral Take 75 mg by mouth daily.    Marland Kitchen FLUTICASONE-SALMETEROL 250-50 MCG/DOSE IN AEPB Inhalation Inhale 1 puff into the lungs 2 (two) times daily. 60 each 1  . FUROSEMIDE 40 MG PO TABS Oral Take 1 tablet (40 mg total) by mouth daily. 30 tablet   . INSULIN ASPART 100 UNIT/ML [place] SOLN Subcutaneous Inject 5 Units into the skin 3 (three) times daily before meals.    . INSULIN GLARGINE 100 UNIT/ML [place] SOLN Subcutaneous Inject 25 Units into the skin at bedtime. 10 mL   . IPRATROPIUM BROMIDE 0.02 % IN SOLN Nebulization Take 0.5 mg by nebulization 4 (four) times daily.    . ISOSORBIDE MONONITRATE ER 60 MG PO TB24 Oral Take 30 mg by mouth every morning.     Marland Kitchen LOPERAMIDE HCL 2 MG PO CAPS  Oral Take 4 mg by mouth 4 (four) times daily as needed. For diarrhea    . LOSARTAN POTASSIUM 100 MG PO TABS Oral Take 1 tablet (100 mg total) by mouth daily. 30 tablet 1  . MOMETASONE FUROATE 50 MCG/ACT NA SUSP Nasal Place 2 sprays into the nose at bedtime.    Marland Kitchen NITROGLYCERIN 0.4 MG SL SUBL Sublingual Place 0.4 mg under the tongue every 5 (five) minutes as needed. For chest pain    . PREGABALIN 50 MG PO CAPS Oral Take 1 capsule (50 mg total) by mouth 2 (two) times daily. 60 capsule 1  . SIMVASTATIN 40 MG PO TABS Oral Take 40 mg by mouth at bedtime.    . TRAMADOL HCL 50 MG PO TABS Oral Take 50 mg by mouth every 6 (six) hours as needed. For pain      BP 128/59  Pulse 86  Temp(Src) 97.6 F (36.4 C) (Oral)  Resp 16  SpO2 100%  Physical Exam  Nursing note and vitals reviewed. Constitutional: She appears well-developed and well-nourished. No distress.  HENT:  Head: Normocephalic and atraumatic.  Right Ear: External ear normal.  Left Ear: External ear normal.  Eyes: Conjunctivae are normal. Right eye exhibits no discharge. Left eye exhibits no discharge. No scleral icterus.  Neck: Neck supple. No tracheal deviation present.  Cardiovascular: Normal rate, regular rhythm and intact distal pulses.   Pulmonary/Chest: Effort normal and breath sounds normal. No stridor. No respiratory distress. She has no wheezes. She has no rales.       Course inspiratory breath sounds, hoarseness of her voice  Abdominal: Soft. Bowel sounds are normal. She exhibits no distension. There is no tenderness. There is no rebound and no guarding.  Musculoskeletal: She exhibits no edema and no tenderness.  Neurological: She is alert. She has normal strength. No sensory deficit. Cranial nerve deficit:  no gross defecits noted. She exhibits normal muscle tone. She displays no seizure activity. Coordination normal.  Skin: Skin is warm and dry. No rash noted.  Psychiatric: She has a normal mood and affect.    ED Course    Procedures (including critical care time)  Date: 12/20/2011  Rate: 80  Rhythm: normal sinus rhythm  QRS Axis: normal  Intervals: normal etc. prolonged QT  ST/T Wave abnormalities: Inverted T waves  Conduction Disutrbances:none  Narrative Interpretation:   Old EKG Reviewed: unchanged   Labs Reviewed  PRO B NATRIURETIC PEPTIDE - Abnormal; Notable for the following:    Pro B Natriuretic peptide (BNP) 853.8 (*)  All other components within normal limits  BASIC METABOLIC PANEL - Abnormal; Notable for the following:    Glucose, Bld 197 (*)    BUN 45 (*)    Creatinine, Ser 1.27 (*)    GFR calc non Af Amer 43 (*)    GFR calc Af Amer 50 (*)    All other components within normal limits  CBC - Abnormal; Notable for the following:    RBC 3.71 (*)    Hemoglobin 11.2 (*)    HCT 34.4 (*)    All other components within normal limits  TROPONIN I   Dg Chest 2 View  12/20/2011  *RADIOLOGY REPORT*  Clinical Data: Shortness of breath.  CHEST - 2 VIEW  Comparison: Chest radiograph performed 12/18/2011  Findings: The lungs are well-aerated.  Mild bibasilar airspace opacities likely reflect atelectasis, slightly improved from the prior study.  No definite pulmonary edema is now seen.  There is no evidence of pleural effusion or pneumothorax.  The heart is borderline enlarged; the patient is status post median sternotomy.  Calcification is seen within the aortic arch.  No acute osseous abnormalities are seen; there is a stable chronic compression deformity at the upper lumbar spine.  Clips are noted within the right upper quadrant, reflecting prior cholecystectomy.  IMPRESSION:  1.  No definite pulmonary edema now seen. 2.  Mild bibasilar airspace opacities likely reflect atelectasis, slightly improved from the prior study. 3.  Borderline cardiomegaly.  Original Report Authenticated By: Tonia Ghent, M.D.      MDM  Patient appears to have a component of COPD exacerbation. At this time there does not  appear to be a pneumonia or component of congestive heart failure. Her previous issues with hyperkalemia and renal failure have resolved. The patient's hoarseness and inspiratory stridor suggest the possibility of a laryngeal mass.  I discussed this with the patient in fact her colon noticing this the last time she was admitted to the hospital. Patient was scheduled to see pulmonary tomorrow per appointment was rescheduled to the 11th. I have spoken with Dr. Craige Cotta and he recommends that the patient call the pulmonary office to reschedule.        Celene Kras, MD 12/20/11 [phone]

## 2011-12-20 NOTE — Progress Notes (Signed)
12/20/2011 [facility] Case Management Note [phone]  Utilization review completed.  

## 2011-12-21 ENCOUNTER — Institutional Professional Consult (permissible substitution): Payer: Medicare Other | Admitting: Pulmonary Disease

## 2011-12-21 NOTE — Discharge Instructions (Signed)
Followup with your doctor this week. Take medications as prescribed. return as needed for worsening symptoms  Hoarseness Hoarseness is produced from a variety of causes. It is important to find the cause so it can be treated. In the absence of a cold or upper respiratory illness, any hoarseness lasting more than 2 weeks should be looked at by a specialist. This is especially important if you have a history of smoking or alcohol use. It is also important to keep in mind that as you grow older, your voice will naturally get weaker, making it easier for you to become hoarse from straining your vocal cords.  CAUSES  Any illness that affects your vocal cords can result in a hoarse voice. Examples of conditions that can affect the vocal cords are listed as follows:   Allergies.   Colds.   Sinusitis.   Gastroesophageal reflux disease.   Croup.   Injury.   Nodules.   Exposure to smoke or toxic fumes or gases.   Congenital and genetic defects.   Paralysis of the vocal cords.   Infections.   Advanced age.  DIAGNOSIS  In order to diagnose the cause of your hoarseness, your caregiver will examine your throat using an instrument that uses a tube with a small lighted camera (laryngoscope). It allows your caregiver to look into the mouth and down the throat. TREATMENT  For most cases, treatment will focus on the specific cause of the hoarseness. Depending on the cause, hoarseness can be a temporary condition (acute) or it can be long lasting (chronic). Most cases of hoarseness clear up without complications. Your caregiver will explain to you if this is not likely to happen. SEEK IMMEDIATE MEDICAL CARE IF:   You have increasing hoarseness or loss of voice.   You have shortness of breath.   You are coughing up blood.   There is pain in your neck or throat.  Document Released: 09/23/2005 Document Revised: 06/22/2011 Document Reviewed: 12/16/2010 [facility] Patient Information 2012  [place], [place].Chronic Obstructive Pulmonary Disease Chronic obstructive pulmonary disease (COPD) is a condition in which airflow from the lungs is restricted. The lungs can never return to normal, but there are measures you can take which will improve them and make you feel better. CAUSES   Smoking.   Exposure to secondhand smoke.   Breathing in irritants (pollution, cigarette smoke, strong smells, aerosol sprays, paint fumes).   History of lung infections.  TREATMENT  Treatment focuses on making you comfortable (supportive care). Your caregiver may prescribe medications (inhaled or pills) to help improve your breathing. HOME CARE INSTRUCTIONS   If you smoke, stop smoking.   Avoid exposure to smoke, chemicals, and fumes that aggravate your breathing.   Take antibiotic medicines as directed by your caregiver.   Avoid medicines that dry up your system and slow down the elimination of secretions (antihistamines and cough syrups). This decreases respiratory capacity and may lead to infections.   Drink enough water and fluids to keep your urine clear or pale yellow. This loosens secretions.   Use humidifiers at home and at your bedside if they do not make breathing difficult.   Receive all protective vaccines your caregiver suggests, especially pneumococcal and influenza.   Use home oxygen as suggested.   Stay active. Exercise and physical activity will help maintain your ability to do things you want to do.   Eat a healthy diet.  SEEK MEDICAL CARE IF:   You develop pus-like mucus (sputum).   Breathing is more  labored or exercise becomes difficult to do.   You are running out of the medicine you take for your breathing.  SEEK IMMEDIATE MEDICAL CARE IF:   You have a rapid heart rate.   You have agitation, confusion, tremors, or are in a stupor (family members may need to observe this).   It becomes difficult to breathe.   You develop chest pain.   You have a fever.  MAKE  SURE YOU:   Understand these instructions.   Will watch your condition.   Will get help right away if you are not doing well or get worse.  Document Released: 07/20/2005 Document Revised: 06/22/2011 Document Reviewed: 12/10/2010 [facility] Patient Information 2012 [place], [place].

## 2011-12-22 ENCOUNTER — Encounter: Payer: Self-pay | Admitting: Internal Medicine

## 2011-12-22 ENCOUNTER — Ambulatory Visit (INDEPENDENT_AMBULATORY_CARE_PROVIDER_SITE_OTHER): Payer: No Typology Code available for payment source | Admitting: Internal Medicine

## 2011-12-22 VITALS — BP 92/52 | HR 71 | Temp 97.6°F | Ht 61.0 in | Wt 153.2 lb

## 2011-12-22 DIAGNOSIS — R0602 Shortness of breath: Secondary | ICD-10-CM

## 2011-12-22 DIAGNOSIS — I5022 Chronic systolic (congestive) heart failure: Secondary | ICD-10-CM

## 2011-12-22 DIAGNOSIS — J383 Other diseases of vocal cords: Secondary | ICD-10-CM

## 2011-12-22 MED ORDER — DEXLANSOPRAZOLE 60 MG PO CPDR: 60.0000 mg | DELAYED_RELEASE_CAPSULE | Freq: Every day | ORAL | Status: DC

## 2011-12-22 MED ORDER — FAMOTIDINE 20 MG PO TABS: ORAL_TABLET | ORAL | Status: DC

## 2011-12-22 MED ORDER — PREDNISONE (PAK) 10 MG PO TABS: ORAL_TABLET | ORAL | Status: DC

## 2011-12-22 NOTE — Assessment & Plan Note (Signed)
  When respiratory symptoms begin or become refractory well after a patient reports complete smoking cessation,  Especially when this wasn't the case while they were smoking, a red flag is raised based on the work of Dr Primitivo Gauze which states:  if you quit smoking when your best day FEV1 is still well preserved it is highly unlikely you will progress to severe disease.  That is to say, once the smoking stops,  the symptoms should not suddenly erupt or markedly worsen.  If so, the differential diagnosis should include  obesity/deconditioning,  LPR/Reflux/Aspiration syndromes,  occult CHF, or  especially side effect of medications commonly used in this population.    She has chf by echo and upper airway obst on exam which could be related to lpr/gerd so rx aggressively for gerd and then rec ent eval of upper airway.Marland Kitchen

## 2011-12-22 NOTE — Progress Notes (Signed)
  Subjective:    Patient ID: [name], female    DOB: Apr 30, 1946  MRN: [id number]  HPI  71 yobf quit smoking April 2012 p hosp in [place] with pna and no trouble breathing then but cont 02 at hs but during the day did fine s neb and only used proaire but moved to [place] in January  2013 and breathing went downhill since so referred by Dr Cliffton Asters 12/22/2011 to pulmonary clinic.  12/22/2011 1st pulmonary eval for cc worsening sob/ hoarseness and dry cough not acei for over a year but similar symptoms thn of dry cough/ hoarseness and variable sob sometimes with minimal activity better with neb and ? Worse on advair.   Sleeping ok without nocturnal  or early am exacerbation  of respiratory  c/o's or need for noct saba. Also denies any obvious fluctuation of symptoms with weather or environmental changes or other aggravating or alleviating factors except as outlined above     Review of Systems  Constitutional: Negative for fever, chills and unexpected weight change.  HENT: Positive for sore throat. Negative for ear pain, nosebleeds, congestion, rhinorrhea, sneezing, trouble swallowing, dental problem, voice change, postnasal drip and sinus pressure.   Eyes: Negative for visual disturbance.  Respiratory: Positive for cough and shortness of breath. Negative for choking.   Cardiovascular: Negative for chest pain and leg swelling.  Gastrointestinal: Negative for vomiting, abdominal pain and diarrhea.  Genitourinary: Negative for difficulty urinating.  Musculoskeletal: Positive for arthralgias.  Skin: Negative for rash.  Neurological: Negative for tremors, syncope and headaches.  Hematological: Does not bruise/bleed easily.       Objective:   Physical Exam  Extremely hoarse amb bf nad  Wt 153 12/22/2011  HEENT mild turbinate edema.  Oropharynx no thrush or excess pnd or cobblestoning.  No JVD or cervical adenopathy. Mild accessory muscle hypertrophy. Trachea midline, nl thryroid. Chest was hyperinflated  by percussion with diminished breath sounds and moderate increased exp time without wheeze. Hoover sign positive at mid inspiration. Regular rate and rhythm without murmur gallop or rub or increase P2 or edema.  Abd: no hsm, nl excursion. Ext warm without cyanosis or clubbing.     12/20/11 1. No definite pulmonary edema now seen.  2. Mild bibasilar airspace opacities likely reflect atelectasis,  slightly improved from the prior study.  3. Borderline cardiomegaly.     Assessment & Plan:

## 2011-12-22 NOTE — Patient Instructions (Addendum)
Prednisone 10 mg take  4 each am x 2 days,   2 each am x 2 days,  1 each am x2days and stop   Stop advair and prefer you use the nebulizer as needed  Try Dexilant 60 mg  Take 30-60 min before first meal of the day and Pepcid 20 mg one bedtime  Until you see the throat doctor   GERD (REFLUX)  is an extremely common cause of respiratory symptoms, many times with no significant heartburn at all.    It can be treated with medication, but also with lifestyle changes including avoidance of late meals, excessive alcohol, smoking cessation, and avoid fatty foods, chocolate, peppermint, colas, red wine, and acidic juices such as orange juice.  NO MINT OR MENTHOL PRODUCTS SO NO COUGH DROPS  USE SUGARLESS CANDY INSTEAD (jolley ranchers or Stover's)  NO OIL BASED VITAMINS - use powdered substitutes.    Please see patient coordinator before you leave today  to schedule ent evaluation on March 11th  - we can see you back if not satisfied after the ENT evaluation is complete

## 2011-12-26 NOTE — Discharge Summary (Signed)
HOSPITAL DISCHARGE SUMMARY  [name]  MRN: [id number]  DOB:[birth year]  Date of Admission: 12/14/2011 Date of Discharge: 12/26/2011         LOS: 5 days   Attending Physician:Nakiea Metzner A  Patient's WUJ:WJXBJ,YNWGNFA S, MD, MD  Consults: None  Discharge Diagnoses: Present on Admission:  .Hyperkalemia .Acute renal failure .Diabetes mellitus .COPD (chronic obstructive pulmonary disease) .CAD (coronary artery disease) .Hypertension .H/O hyperthyroidism s/p thyroidectomy   Medication List  As of 12/26/2011  3:06 PM   STOP taking these medications         Fluticasone-Salmeterol 250-50 MCG/DOSE Aepb      potassium chloride SA 20 MEQ tablet      spironolactone 25 MG tablet         TAKE these medications         acetaminophen 500 MG tablet   Commonly known as: TYLENOL   Take 500 mg by mouth every 6 (six) hours as needed. For pain      albuterol 108 (90 BASE) MCG/ACT inhaler   Commonly known as: PROVENTIL HFA;VENTOLIN HFA   Inhale 2 puffs into the lungs every 6 (six) hours as needed. For wheezing      albuterol (5 MG/ML) 0.5% nebulizer solution   Commonly known as: PROVENTIL   Take 2.5 mg by nebulization 4 (four) times daily.      aspirin EC 81 MG tablet   Take 81 mg by mouth daily.      Blood Glucose Monitoring Suppl W/DEVICE Kit   Use to check blood sugar 2 times daily      carvedilol 25 MG tablet   Commonly known as: COREG   Take 25 mg by mouth 2 (two) times daily with a meal.      clopidogrel 75 MG tablet   Commonly known as: PLAVIX   Take 75 mg by mouth daily.      furosemide 40 MG tablet   Commonly known as: LASIX   Take 1 tablet (40 mg total) by mouth daily.      insulin aspart 100 UNIT/ML injection   Commonly known as: novoLOG   Inject 5 Units into the skin 3 (three) times daily before meals.      insulin glargine 100 UNIT/ML injection   Commonly known as: LANTUS   Inject 25 Units into the skin at bedtime.      ipratropium 0.02 % nebulizer  solution   Commonly known as: ATROVENT   Take 0.5 mg by nebulization 4 (four) times daily.      isosorbide mononitrate 60 MG 24 hr tablet   Commonly known as: IMDUR   Take 30 mg by mouth every morning.      loperamide 2 MG capsule   Commonly known as: IMODIUM   Take 4 mg by mouth 4 (four) times daily as needed. For diarrhea      losartan 100 MG tablet   Commonly known as: COZAAR   Take 1 tablet (100 mg total) by mouth daily.      mometasone 50 MCG/ACT nasal spray   Commonly known as: NASONEX   Place 2 sprays into the nose at bedtime.      nitroGLYCERIN 0.4 MG SL tablet   Commonly known as: NITROSTAT   Place 0.4 mg under the tongue every 5 (five) minutes as needed. For chest pain      pregabalin 50 MG capsule   Commonly known as: LYRICA   Take 1 capsule (50 mg total) by mouth 2 (two) times daily.  simvastatin 40 MG tablet   Commonly known as: ZOCOR   Take 40 mg by mouth at bedtime.      traMADol 50 MG tablet   Commonly known as: ULTRAM   Take 50 mg by mouth every 6 (six) hours as needed. For pain             Brief Admission History: 65yoF with h/o DM/HTN, CAD s/p CABG, stenting, chronic systolic HF, TIA vs stroke 01/2011, s/p left CEA 2008 now presents in acute renal failure and severe hyperkalemia, likely medication effect from recently increased Lasix, recently started Spironolactone, and oral potassium supplements. The pt was admitted to IM teaching service twice this month, however bc she has been set up with [facility] as her PCP and this admission wasn't felt related to two prior admissions, [facility] contacted for admission. 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 Carotids showed 40-60% right and 60-80% left. Echo with EF 30-35%, diffuse hypokinesis and inferior hyopkinesis. Sent home on ASA/plavix. She was  then re-admitted on 2/7-2/9 with shortness of breath with wheezing since discharge from the hospital. EMS had been going to her house nightly to give her nebulizers. SOB was felt likely due to CHF. She had minimally increased Troponins that were just trended. Lasix was increased to 40 mg BID and spironolactone started. She was discharged home. She was seen in the interim by Dr. Cliffton Asters, who has also set her up to see a cardiologist and pulmonologist next week, per her daughter at bedside. Overall, she's been doing quite well since last discharge, without much complaint until last night, when the patient complained of generalized weakness in her lower extremities, and left hand numbness and weakness. She was barely able to walk. Currently, she states that her symptoms are much better since treatment. In the ED, vitals were stable. Labs showed hypoNa 134, hyperK >7.5, renal failure 90/2.15 with last values 24/0.99, hyperglycemia 267. Trop negative, BNP decreased to 1068. CBC stable. UA without overt infection. CXR with worsened bibasilar opacification, concerning for PNA, and underlying vascular congestion and mild cardiomegaly, atypical for PNA. CT head was negative for acute process. Pt has been given tylenol, albuterol nebs, ca gluconate 1g, dextrose and insulin 10u x2, kayexalate 15g, NS 500 bolus, sodium bicarb 50 mEq, methylprednisolone 125mg . The daughter states she was increased from 40 Lasix daily to 40 twice daily, endorses compliance with the spironolactone and potassium. She has been getting in good PO intake including fluids. They deny any chest pain, SOB, dizziness, lightheadedness, GI issues of n/v/d/abd pain, or any other complaints and as above was doing well since last discharge.   Hospital Course: Present on Admission:  .Hyperkalemia .Acute renal failure .Diabetes mellitus .COPD (chronic obstructive pulmonary disease) .CAD (coronary artery disease) .Hypertension .H/O hyperthyroidism s/p  thyroidectomy  1. Hyperkalemia: Patient did have hyperkalemia, potassium level was more than 7.5 with EKG changes with widening of the QRS complex. Patient admitted to the ICU at the time of presentation. She was given calcium gluconate, dextrose and insulin 10 units x2 and sodium bicarbonate. Patient also was given sodium bicarbonate. The hyperkalemia is multifactorial secondary to acute renal failure, potassium supplementation, spironolactone and lastly losartan. Hyperkalemia resolved. At the time of discharge potassium level is 4.6 but patient will not be on potassium supplementation. EKG changes resolved.  2. Acute renal failure: This is likely  secondary to overdiuresis. Patient was on 40 mg of Lasix daily this was increased last time she was in the hospital here to 40 mg twice a day and Aldactone also introduced to her regimen. Patient also was on ARB along with the diuretics. At the time of discharge she'll be only on 40 mg of Lasix daily with Cozaar. Special attention to the renal function should be done with close monitoring of her BMP. Patient should follow up with her primary care physician within one week.  3. Chronic systolic heart failure: LVEF of 33% on 11/27/2011. She is likely NYHA class 2-3. Patient needs followup with cardiologist for further adjustment of her regimen. She is on Coreg, Cozaar, Imdur and Lasix.  4. Diabetes mellitus type 2: Patient was on a 35 units of Lantus insulin and it was noted in the hospital that she has morning hypoglycemia, she had a nadir of 39 for her CBG. Her Lantus she came to 25 units and 5 units of NovoLog with meals.  5. HTN/CAD: Hypertension medication same as CHF medications, patient also on aspirin and Plavix. As mentioned above the patient needs referral to a local cardiologist as she just moved from [place].   Day of Discharge BP 101/61  Pulse 78  Temp(Src) 97.7 F (36.5 C) (Oral)  Resp 18  Ht 5\' 2"  (1.575 m)  Wt 68.266 kg (150 lb 8 oz)   BMI 27.53 kg/m2  SpO2 100% Physical Exam: GEN: No acute distress, cooperative with exam PSYCH: He is alert and oriented x4; does not appear anxious does not appear depressed; affect is normal  HEENT: Mucous membranes pink and anicteric;  Mouth: without oral thrush or lesions Eyes: PERRLA; EOM intact;  Neck: no cervical lymphadenopathy nor thyromegaly or carotid bruit; no JVD;  CHEST WALL: No tenderness, symmetrical to breathing bilaterally CHEST: Normal respiration, clear to auscultation bilaterally  HEART: Regular rate and rhythm; no murmurs, rubs or gallops, S1 and S2 heard  BACK: No kyphosis or scoliosis; no CVA tenderness  ABDOMEN:  soft non-tender; no masses, no organomegaly, normal abdominal bowel sounds; no pannus; no intertriginous candida.  EXTREMITIES: No bone or joint deformity; no edema; no ulcerations.  PULSES: 2+ and symmetric, neurovascularity is intact SKIN: Normal hydration no rash or ulceration, no flushing or suspicious lesions  CNS: Cranial nerves 2-12 grossly intact no focal neurologic deficit, coordination is intact gait not tested    No results found for this or any previous visit (from the past 24 hour(s)).  Disposition: Home   Follow-up Appts: Discharge Orders    Future Appointments: Provider: Department: Dept Phone: Center:   02/02/2012 1:30 PM [facility] [phone] [facility]   02/02/2012 2:30 PM Sherren Kerns, MD [facility] [phone] [facility]     Future Orders Please Complete By Expires   Diet - low sodium heart healthy      Increase activity slowly         Follow-up Information    Follow up with Cala Bradford, MD. Schedule an appointment as soon as possible for a visit in 1 week.   Contact information:   [address] [place] [address] [phone]          I spent 40 minutes completing paperwork and coordinating discharge efforts.  SignedClydia Llano A 12/26/2011, 3:06 PM

## 2012-01-02 ENCOUNTER — Encounter (HOSPITAL_COMMUNITY): Payer: Self-pay | Admitting: Anesthesiology

## 2012-01-02 ENCOUNTER — Institutional Professional Consult (permissible substitution): Payer: Medicare Other | Admitting: Pulmonary Disease

## 2012-01-02 ENCOUNTER — Encounter (HOSPITAL_COMMUNITY)
Admission: EM | Disposition: A | Discharge status: Skilled Nursing Facility | Payer: Self-pay | Source: Home / Self Care | Attending: Otolaryngology

## 2012-01-02 ENCOUNTER — Encounter (HOSPITAL_COMMUNITY): Payer: Self-pay | Admitting: *Deleted

## 2012-01-02 ENCOUNTER — Inpatient Hospital Stay (HOSPITAL_COMMUNITY)
Admission: EM | Admit: 2012-01-02 | Discharge: 2012-01-10 | DRG: 013 | Disposition: A | Discharge status: Skilled Nursing Facility | Payer: Medicare Other | Attending: Otolaryngology | Admitting: Otolaryngology

## 2012-01-02 ENCOUNTER — Emergency Department (HOSPITAL_COMMUNITY): Payer: Medicare Other | Admitting: Anesthesiology

## 2012-01-02 ENCOUNTER — Inpatient Hospital Stay: Admit: 2012-01-02 | Payer: Self-pay | Admitting: Otolaryngology

## 2012-01-02 DIAGNOSIS — Z951 Presence of aortocoronary bypass graft: Secondary | ICD-10-CM

## 2012-01-02 DIAGNOSIS — J4489 Other specified chronic obstructive pulmonary disease: Secondary | ICD-10-CM | POA: Diagnosis present

## 2012-01-02 DIAGNOSIS — Z9861 Coronary angioplasty status: Secondary | ICD-10-CM

## 2012-01-02 DIAGNOSIS — E663 Overweight: Secondary | ICD-10-CM | POA: Diagnosis present

## 2012-01-02 DIAGNOSIS — R0609 Other forms of dyspnea: Secondary | ICD-10-CM | POA: Diagnosis present

## 2012-01-02 DIAGNOSIS — E119 Type 2 diabetes mellitus without complications: Secondary | ICD-10-CM | POA: Diagnosis present

## 2012-01-02 DIAGNOSIS — K219 Gastro-esophageal reflux disease without esophagitis: Secondary | ICD-10-CM | POA: Diagnosis present

## 2012-01-02 DIAGNOSIS — I739 Peripheral vascular disease, unspecified: Secondary | ICD-10-CM | POA: Diagnosis present

## 2012-01-02 DIAGNOSIS — Z7902 Long term (current) use of antithrombotics/antiplatelets: Secondary | ICD-10-CM

## 2012-01-02 DIAGNOSIS — I509 Heart failure, unspecified: Secondary | ICD-10-CM | POA: Diagnosis present

## 2012-01-02 DIAGNOSIS — E039 Hypothyroidism, unspecified: Secondary | ICD-10-CM | POA: Diagnosis present

## 2012-01-02 DIAGNOSIS — R061 Stridor: Secondary | ICD-10-CM | POA: Diagnosis present

## 2012-01-02 DIAGNOSIS — I252 Old myocardial infarction: Secondary | ICD-10-CM

## 2012-01-02 DIAGNOSIS — R0989 Other specified symptoms and signs involving the circulatory and respiratory systems: Secondary | ICD-10-CM | POA: Diagnosis present

## 2012-01-02 DIAGNOSIS — Z7982 Long term (current) use of aspirin: Secondary | ICD-10-CM

## 2012-01-02 DIAGNOSIS — Z8673 Personal history of transient ischemic attack (TIA), and cerebral infarction without residual deficits: Secondary | ICD-10-CM

## 2012-01-02 DIAGNOSIS — I2510 Atherosclerotic heart disease of native coronary artery without angina pectoris: Secondary | ICD-10-CM | POA: Diagnosis present

## 2012-01-02 DIAGNOSIS — Z79899 Other long term (current) drug therapy: Secondary | ICD-10-CM

## 2012-01-02 DIAGNOSIS — J449 Chronic obstructive pulmonary disease, unspecified: Secondary | ICD-10-CM | POA: Diagnosis present

## 2012-01-02 DIAGNOSIS — Z87891 Personal history of nicotine dependence: Secondary | ICD-10-CM

## 2012-01-02 DIAGNOSIS — J386 Stenosis of larynx: Principal | ICD-10-CM | POA: Diagnosis present

## 2012-01-02 DIAGNOSIS — Z794 Long term (current) use of insulin: Secondary | ICD-10-CM

## 2012-01-02 HISTORY — PX: TRACHEOSTOMY TUBE PLACEMENT: SHX814

## 2012-01-02 LAB — GLUCOSE, CAPILLARY
Glucose-Capillary: 309 mg/dL — ABNORMAL HIGH (ref 70–99)
Glucose-Capillary: 249 mg/dL — ABNORMAL HIGH (ref 70–99)

## 2012-01-02 SURGERY — CREATION, TRACHEOSTOMY
Anesthesia: General | Wound class: Clean Contaminated

## 2012-01-02 MED ORDER — PANTOPRAZOLE SODIUM 40 MG PO TBEC
40.0000 mg | DELAYED_RELEASE_TABLET | Freq: Every day | ORAL | Status: DC
  Administered 2012-01-03 – 2012-01-05 (×3): 40 mg via ORAL
  Filled 2012-01-02 (×3): qty 1

## 2012-01-02 MED ORDER — KETAMINE HCL 50 MG/ML IJ SOLN
INTRAMUSCULAR | Status: DC | PRN
  Administered 2012-01-02: 25 mg via INTRAMUSCULAR

## 2012-01-02 MED ORDER — SUCCINYLCHOLINE CHLORIDE 20 MG/ML IJ SOLN
INTRAMUSCULAR | Status: DC | PRN
  Administered 2012-01-02: 80 mg via INTRAVENOUS

## 2012-01-02 MED ORDER — ALBUTEROL SULFATE HFA 108 (90 BASE) MCG/ACT IN AERS: 2.0000 | INHALATION_SPRAY | Freq: Four times a day (QID) | RESPIRATORY_TRACT | Status: DC | PRN

## 2012-01-02 MED ORDER — CLOPIDOGREL BISULFATE 75 MG PO TABS
75.0000 mg | ORAL_TABLET | Freq: Every day | ORAL | Status: DC
  Administered 2012-01-03 – 2012-01-10 (×8): 75 mg via ORAL
  Filled 2012-01-02 (×9): qty 1

## 2012-01-02 MED ORDER — PREGABALIN 50 MG PO CAPS
50.0000 mg | ORAL_CAPSULE | Freq: Two times a day (BID) | ORAL | Status: DC
  Administered 2012-01-03 – 2012-01-10 (×15): 50 mg via ORAL
  Filled 2012-01-02 (×15): qty 1

## 2012-01-02 MED ORDER — ASPIRIN EC 81 MG PO TBEC
81.0000 mg | DELAYED_RELEASE_TABLET | Freq: Every day | ORAL | Status: DC
  Administered 2012-01-03 – 2012-01-10 (×8): 81 mg via ORAL
  Filled 2012-01-02 (×9): qty 1

## 2012-01-02 MED ORDER — LIDOCAINE-EPINEPHRINE 1 %-1:100000 IJ SOLN
INTRAMUSCULAR | Status: DC | PRN
  Administered 2012-01-02: 5 mL

## 2012-01-02 MED ORDER — FENTANYL CITRATE 0.05 MG/ML IJ SOLN
INTRAMUSCULAR | Status: DC | PRN
  Administered 2012-01-02: 25 ug via INTRAVENOUS

## 2012-01-02 MED ORDER — SODIUM CHLORIDE 0.9 % IV SOLN
INTRAVENOUS | Status: DC | PRN
  Administered 2012-01-02: 17:00:00 via INTRAVENOUS

## 2012-01-02 MED ORDER — HYDROMORPHONE HCL PF 1 MG/ML IJ SOLN: 0.2500 mg | INTRAMUSCULAR | Status: DC | PRN

## 2012-01-02 MED ORDER — ACETAMINOPHEN 325 MG PO TABS
650.0000 mg | ORAL_TABLET | Freq: Four times a day (QID) | ORAL | Status: DC | PRN
  Administered 2012-01-03 – 2012-01-10 (×10): 650 mg via ORAL
  Filled 2012-01-02 (×10): qty 2

## 2012-01-02 MED ORDER — SIMVASTATIN 40 MG PO TABS
40.0000 mg | ORAL_TABLET | Freq: Every day | ORAL | Status: DC
  Administered 2012-01-03 – 2012-01-09 (×7): 40 mg via ORAL
  Filled 2012-01-02 (×9): qty 1

## 2012-01-02 MED ORDER — LOSARTAN POTASSIUM 50 MG PO TABS
100.0000 mg | ORAL_TABLET | Freq: Every day | ORAL | Status: DC
  Administered 2012-01-03 – 2012-01-10 (×8): 100 mg via ORAL
  Filled 2012-01-02 (×9): qty 2

## 2012-01-02 MED ORDER — ISOSORBIDE MONONITRATE ER 30 MG PO TB24
30.0000 mg | ORAL_TABLET | Freq: Every day | ORAL | Status: DC
  Administered 2012-01-03 – 2012-01-10 (×8): 30 mg via ORAL
  Filled 2012-01-02 (×9): qty 1

## 2012-01-02 MED ORDER — POTASSIUM CHLORIDE IN NACL 20-0.45 MEQ/L-% IV SOLN
INTRAVENOUS | Status: DC
  Administered 2012-01-02 – 2012-01-05 (×4): via INTRAVENOUS
  Filled 2012-01-02 (×8): qty 1000

## 2012-01-02 MED ORDER — PROMETHAZINE HCL 25 MG PO TABS: 12.5000 mg | ORAL_TABLET | Freq: Four times a day (QID) | ORAL | Status: DC | PRN

## 2012-01-02 MED ORDER — LACTATED RINGERS IV SOLN
INTRAVENOUS | Status: DC | PRN
  Administered 2012-01-02: 17:00:00 via INTRAVENOUS

## 2012-01-02 MED ORDER — PROMETHAZINE HCL 25 MG/ML IJ SOLN: 6.2500 mg | INTRAMUSCULAR | Status: DC | PRN

## 2012-01-02 MED ORDER — NITROGLYCERIN 0.4 MG SL SUBL: 0.4000 mg | SUBLINGUAL_TABLET | SUBLINGUAL | Status: DC | PRN

## 2012-01-02 MED ORDER — MORPHINE SULFATE 2 MG/ML IJ SOLN
2.0000 mg | INTRAMUSCULAR | Status: DC | PRN
  Administered 2012-01-03 – 2012-01-08 (×9): 2 mg via INTRAVENOUS
  Filled 2012-01-02 (×9): qty 1

## 2012-01-02 MED ORDER — ALBUTEROL SULFATE (5 MG/ML) 0.5% IN NEBU
2.5000 mg | INHALATION_SOLUTION | Freq: Four times a day (QID) | RESPIRATORY_TRACT | Status: DC
  Administered 2012-01-02 – 2012-01-10 (×31): 2.5 mg via RESPIRATORY_TRACT
  Filled 2012-01-02 (×29): qty 0.5

## 2012-01-02 MED ORDER — LIDOCAINE HCL (CARDIAC) 20 MG/ML IV SOLN
INTRAVENOUS | Status: DC | PRN
  Administered 2012-01-02: 60 mg via INTRAVENOUS

## 2012-01-02 MED ORDER — LOPERAMIDE HCL 2 MG PO CAPS
2.0000 mg | ORAL_CAPSULE | Freq: Four times a day (QID) | ORAL | Status: DC | PRN
  Filled 2012-01-02: qty 1

## 2012-01-02 MED ORDER — IPRATROPIUM BROMIDE 0.02 % IN SOLN
0.5000 mg | Freq: Four times a day (QID) | RESPIRATORY_TRACT | Status: DC
  Administered 2012-01-02 – 2012-01-10 (×31): 0.5 mg via RESPIRATORY_TRACT
  Filled 2012-01-02 (×29): qty 2.5

## 2012-01-02 MED ORDER — MIDAZOLAM HCL 5 MG/5ML IJ SOLN
INTRAMUSCULAR | Status: DC | PRN
  Administered 2012-01-02: 0.5 mg via INTRAVENOUS

## 2012-01-02 MED ORDER — CARVEDILOL 25 MG PO TABS
25.0000 mg | ORAL_TABLET | Freq: Two times a day (BID) | ORAL | Status: DC
  Administered 2012-01-03 – 2012-01-10 (×15): 25 mg via ORAL
  Filled 2012-01-02 (×17): qty 1

## 2012-01-02 MED ORDER — INSULIN ASPART 100 UNIT/ML ~~LOC~~ SOLN
5.0000 [IU] | Freq: Three times a day (TID) | SUBCUTANEOUS | Status: DC
  Administered 2012-01-03 – 2012-01-10 (×17): 5 [IU] via SUBCUTANEOUS

## 2012-01-02 MED ORDER — 0.9 % SODIUM CHLORIDE (POUR BTL) OPTIME
TOPICAL | Status: DC | PRN
  Administered 2012-01-02: 1000 mL

## 2012-01-02 MED ORDER — FUROSEMIDE 40 MG PO TABS
40.0000 mg | ORAL_TABLET | Freq: Every day | ORAL | Status: DC
  Administered 2012-01-03 – 2012-01-07 (×5): 40 mg via ORAL
  Filled 2012-01-02 (×5): qty 1

## 2012-01-02 MED ORDER — INSULIN GLARGINE 100 UNIT/ML ~~LOC~~ SOLN
25.0000 [IU] | Freq: Every day | SUBCUTANEOUS | Status: DC
  Administered 2012-01-02 – 2012-01-09 (×7): 25 [IU] via SUBCUTANEOUS

## 2012-01-02 MED ORDER — MEPERIDINE HCL 25 MG/ML IJ SOLN: 6.2500 mg | INTRAMUSCULAR | Status: DC | PRN

## 2012-01-02 MED ORDER — ACETAMINOPHEN 650 MG RE SUPP: 650.0000 mg | Freq: Four times a day (QID) | RECTAL | Status: DC | PRN

## 2012-01-02 SURGICAL SUPPLY — 48 items
BALLN PULM 15 16.5 18X75 (BALLOONS) ×2
BALLOON PULM 15 16.5 18X75 (BALLOONS) ×1 IMPLANT
BLADE SURG 15 STRL LF DISP TIS (BLADE) ×1 IMPLANT
BLADE SURG 15 STRL SS (BLADE) ×1
BLADE SURG ROTATE 9660 (MISCELLANEOUS) IMPLANT
CANISTER SUCTION 2500CC (MISCELLANEOUS) ×2 IMPLANT
CLEANER TIP ELECTROSURG 2X2 (MISCELLANEOUS) ×2 IMPLANT
CLOTH BEACON ORANGE TIMEOUT ST (SAFETY) ×2 IMPLANT
CONT SPEC 4OZ CLIKSEAL STRL BL (MISCELLANEOUS) IMPLANT
COVER SURGICAL LIGHT HANDLE (MISCELLANEOUS) ×2 IMPLANT
COVER TABLE BACK 60X90 (DRAPES) ×2 IMPLANT
CRADLE DONUT ADULT HEAD (MISCELLANEOUS) IMPLANT
DRAPE PROXIMA HALF (DRAPES) ×2 IMPLANT
ELECT COATED BLADE 2.86 ST (ELECTRODE) ×2 IMPLANT
ELECT REM PT RETURN 9FT ADLT (ELECTROSURGICAL) ×2
ELECTRODE REM PT RTRN 9FT ADLT (ELECTROSURGICAL) ×1 IMPLANT
GAUZE SPONGE 4X4 16PLY XRAY LF (GAUZE/BANDAGES/DRESSINGS) ×2 IMPLANT
GLOVE BIO SURGEON STRL SZ7.5 (GLOVE) ×2 IMPLANT
GOWN STRL NON-REIN LRG LVL3 (GOWN DISPOSABLE) ×4 IMPLANT
GUARD TEETH (MISCELLANEOUS) ×2 IMPLANT
HOLDER TRACH TUBE VELCRO 19.5 (MISCELLANEOUS) ×2 IMPLANT
KIT BASIN OR (CUSTOM PROCEDURE TRAY) ×2 IMPLANT
KIT ROOM TURNOVER OR (KITS) ×2 IMPLANT
KIT SUCTION CATH 14FR (SUCTIONS) IMPLANT
MARKER SKIN DUAL TIP RULER LAB (MISCELLANEOUS) IMPLANT
NS IRRIG 1000ML POUR BTL (IV SOLUTION) ×2 IMPLANT
PACK EENT II TURBAN DRAPE (CUSTOM PROCEDURE TRAY) ×2 IMPLANT
PAD ARMBOARD 7.5X6 YLW CONV (MISCELLANEOUS) ×4 IMPLANT
PATTIES SURGICAL .5 X3 (DISPOSABLE) IMPLANT
PENCIL BUTTON HOLSTER BLD 10FT (ELECTRODE) ×2 IMPLANT
SOLUTION ANTI FOG 6CC (MISCELLANEOUS) IMPLANT
SPONGE DRAIN TRACH 4X4 STRL 2S (GAUZE/BANDAGES/DRESSINGS) ×2 IMPLANT
SPONGE GAUZE 4X4 12PLY (GAUZE/BANDAGES/DRESSINGS) ×2 IMPLANT
SPONGE INTESTINAL PEANUT (DISPOSABLE) IMPLANT
SURGILUBE 2OZ TUBE FLIPTOP (MISCELLANEOUS) ×2 IMPLANT
SUT SILK 0 FSL (SUTURE) ×2 IMPLANT
SUT SILK 2 0 REEL (SUTURE) ×2 IMPLANT
SUT SILK 2 0 SH CR/8 (SUTURE) ×2 IMPLANT
SUT VIC AB 2-0 FS1 27 (SUTURE) IMPLANT
SYR 20ML ECCENTRIC (SYRINGE) ×2 IMPLANT
SYR BULB 3OZ (MISCELLANEOUS) ×2 IMPLANT
SYR CONTROL 10ML LL (SYRINGE) IMPLANT
SYR INFLATE BILIARY GAUGE (MISCELLANEOUS) ×2 IMPLANT
TOWEL OR 17X24 6PK STRL BLUE (TOWEL DISPOSABLE) ×4 IMPLANT
TOWEL OR 17X26 10 PK STRL BLUE (TOWEL DISPOSABLE) ×2 IMPLANT
TUBE CONNECTING 12X1/4 (SUCTIONS) ×2 IMPLANT
TUBE TRACH SHILEY  6 DIST  CUF (TUBING) ×2 IMPLANT
WATER STERILE IRR 1000ML POUR (IV SOLUTION) ×2 IMPLANT

## 2012-01-02 NOTE — Brief Op Note (Signed)
01/02/2012  5:45 PM  PATIENT:  [name]  66 y.o. female  PRE-OPERATIVE DIAGNOSIS:  Subglottic stenosis  POST-OPERATIVE DIAGNOSIS: Subglottic stenosis  PROCEDURE:  Procedure(s) (LRB): TRACHEOSTOMY (N/A) DIRECT LARYNGOSCOPY WITH BIOPSY (N/A)  SURGEON:  Surgeon(s) and Role:    * Christia Reading, MD - Primary  PHYSICIAN ASSISTANT:   ASSISTANTS: none   ANESTHESIA:   local followed by general  EBL:     BLOOD ADMINISTERED:none  DRAINS: none   LOCAL MEDICATIONS USED:  LIDOCAINE   SPECIMEN:  Source of Specimen:  Left subglottic stenosis  DISPOSITION OF SPECIMEN:  PATHOLOGY  COUNTS:  YES  TOURNIQUET:  * No tourniquets in log *  DICTATION: .Other Dictation: Dictation Number [id number]  PLAN OF CARE: Admit to inpatient   PATIENT DISPOSITION:  PACU - hemodynamically stable.   Delay start of Pharmacological VTE agent (>24hrs) due to surgical blood loss or risk of bleeding: no

## 2012-01-02 NOTE — OR Nursing (Addendum)
Tracheostomy completed @ 1726.  Laryngoscopy @1730 . Obturator taped to head of bed.

## 2012-01-02 NOTE — ED Notes (Signed)
Pt came from dr. Jenne Pane office in need of trach on arrival in the OR.  Pt had a previous intubation and question trauma from that intubation causing sob.  OR said to undress, iv and consent patient and then to OR.  Dr. Jenne Pane at bedside now.  Pt is on nrb

## 2012-01-02 NOTE — Anesthesia Postprocedure Evaluation (Signed)
  Anesthesia Post-op Note  Patient: [name]  Procedure(s) Performed: Procedure(s) (LRB): TRACHEOSTOMY (N/A) DIRECT LARYNGOSCOPY (N/A)  Patient Location: PACU  Anesthesia Type: MAC  Level of Consciousness: awake and responds to stimulation  Airway and Oxygen Therapy: Patient Spontanous Breathing  Post-op Pain: none  Post-op Assessment: Post-op Vital signs reviewed, Patient's Cardiovascular Status Stable, Respiratory Function Stable, Patent Airway and Pain level controlled  Post-op Vital Signs: stable  Complications: No apparent anesthesia complications

## 2012-01-02 NOTE — Anesthesia Preprocedure Evaluation (Signed)
Anesthesia Evaluation  Patient identified by MRN, date of birth, ID band Patient awake    Reviewed: Unable to perform ROS - Chart review only  Airway Mallampati: III  Neck ROM: Limited    Dental  (+) Upper Dentures   Pulmonary shortness of breath, pneumonia , COPD   + decreased breath sounds      Cardiovascular hypertension, + CAD, + Past MI and +CHF Rhythm:Regular Rate:Tachycardia     Neuro/Psych    GI/Hepatic   Endo/Other  Diabetes mellitus-Hypothyroidism   Renal/GU      Musculoskeletal   Abdominal (+) + obese,   Peds  Hematology   Anesthesia Other Findings   Reproductive/Obstetrics                           Anesthesia Physical Anesthesia Plan  ASA: III and Emergent  Anesthesia Plan: General and MAC   Post-op Pain Management:    Induction: Intravenous  Airway Management Planned: Tracheostomy  Additional Equipment:   Intra-op Plan:   Post-operative Plan:   Informed Consent: I have reviewed the patients History and Physical, chart, labs and discussed the procedure including the risks, benefits and alternatives for the proposed anesthesia with the patient or authorized representative who has indicated his/her understanding and acceptance.   Dental advisory given  Plan Discussed with: CRNA and Surgeon  Anesthesia Plan Comments:         Anesthesia Quick Evaluation

## 2012-01-02 NOTE — ED Notes (Signed)
Pt has been consented and sent to OR.

## 2012-01-02 NOTE — H&P (Signed)
[name] is an 66 y.o. female.   Chief Complaint: shortness of breath HPI: 66 year old female presented to our office today as outpatient visit for shortness of breath.  Had been evaluated for same by Dr. Sherene Sires who felt her problem seemed to be in her upper airway.  She was hospitalized for a month last April for pneumonia including two weeks on a ventilator with endotracheal intubation.  Difficulty breathing began about a month ago and has varied in intensity.  Today, she is having severe difficulty breathing.  Swallow is fine and voice is a bit hoarse.  She has COPD but denies auto-immune conditions.  In the office, mirror exam revealed markedly narrowed subglottic airway with inflamed appearance and narrowing from each side as opposed to being circumferential.  She was brought to hospital by EMS due to severity of symptoms to undergo awake tracheostomy.  She has been treated with prednisone in the past few weeks without much benefit.  Past Medical History  Diagnosis Date  . Diabetes mellitus     on Lantus 30 U   . MI (myocardial infarction)   . CAD (coronary artery disease)     S/p CABG and stenting  . S/P CABG (coronary artery bypass graft)   . PAD (peripheral artery disease)   . Stroke     MRI 11/2011 with remote occipital lobe. MRA with moderate left focal vertebral artery stenosis  . CHF (congestive heart failure) 11/2011    Echo with EF 30-35%, global hypokinesis, and inferior akinesis  . Pneumonia   . Hypothyroidism     Past Surgical History  Procedure Date  . Ptca   . Thyroidectomy   . Coronary artery bypass graft     2 vessel  . Carotid endarterectomy ~2008    Left   . Cholecystectomy, laparoscopic less than 6 months ago  . Cholecystectomy     No family history on file. Social History:  reports that she quit smoking about 11 months ago. Her smoking use included Cigarettes. She has a 50 pack-year smoking history. She has never used smokeless tobacco. She reports that  she does not drink alcohol or use illicit drugs.  Allergies:  Allergies  Allergen Reactions  . Crestor (Rosuvastatin Calcium) Other (See Comments)    Muscle Pain  . Lisinopril Other (See Comments)    "Lowers her BP too low"  . Vicodin (Hydrocodone-Acetaminophen) Nausea And Vomiting    No current facility-administered medications on file as of 01/02/2012.   Medications Prior to Admission  Medication Sig Dispense Refill  . acetaminophen (TYLENOL) 500 MG tablet Take 500 mg by mouth every 6 (six) hours as needed. For pain      . albuterol (PROVENTIL HFA;VENTOLIN HFA) 108 (90 BASE) MCG/ACT inhaler Inhale 2 puffs into the lungs every 6 (six) hours as needed. For wheezing      . albuterol (PROVENTIL) (5 MG/ML) 0.5% nebulizer solution Take 2.5 mg by nebulization 4 (four) times daily.      Marland Kitchen aspirin EC 81 MG tablet Take 81 mg by mouth daily.      . Blood Glucose Monitoring Suppl W/DEVICE KIT Use to check blood sugar 2 times daily  1 each  0  . carvedilol (COREG) 25 MG tablet Take 25 mg by mouth 2 (two) times daily with a meal.      . clopidogrel (PLAVIX) 75 MG tablet Take 75 mg by mouth daily.      Marland Kitchen dexlansoprazole (DEXILANT) 60 MG capsule Take 1 capsule (  60 mg total) by mouth daily.      . famotidine (PEPCID) 20 MG tablet One at bedtime  30 tablet  11  . furosemide (LASIX) 40 MG tablet Take 1 tablet (40 mg total) by mouth daily.  30 tablet    . insulin aspart (NOVOLOG) 100 UNIT/ML injection Inject 5 Units into the skin 3 (three) times daily before meals.      . insulin glargine (LANTUS) 100 UNIT/ML injection Inject 25 Units into the skin at bedtime.  10 mL    . ipratropium (ATROVENT) 0.02 % nebulizer solution Take 0.5 mg by nebulization 4 (four) times daily.      . isosorbide mononitrate (IMDUR) 60 MG 24 hr tablet Take 30 mg by mouth every morning.       . loperamide (IMODIUM) 2 MG capsule Take 4 mg by mouth 4 (four) times daily as needed. For diarrhea      . losartan (COZAAR) 100 MG tablet  Take 1 tablet (100 mg total) by mouth daily.  30 tablet  1  . mometasone (NASONEX) 50 MCG/ACT nasal spray Place 2 sprays into the nose at bedtime.      . nitroGLYCERIN (NITROSTAT) 0.4 MG SL tablet Place 0.4 mg under the tongue every 5 (five) minutes as needed. For chest pain      . predniSONE (STERAPRED UNI-PAK) 10 MG tablet Prednisone 10 mg take  4 each am x 2 days,   2 each am x 2 days,  1 each am x2days and stop  14 tablet  0  . pregabalin (LYRICA) 50 MG capsule Take 1 capsule (50 mg total) by mouth 2 (two) times daily.  60 capsule  1  . simvastatin (ZOCOR) 40 MG tablet Take 40 mg by mouth at bedtime.      . traMADol (ULTRAM) 50 MG tablet Take 50 mg by mouth every 6 (six) hours as needed. For pain        No results found for this or any previous visit (from the past 48 hour(s)). No results found.  Review of Systems  All other systems reviewed and are negative.    Blood pressure 185/90, pulse 106, temperature 97.9 F (36.6 C), temperature source Oral, resp. rate 24, SpO2 100.00%. Physical Exam  Constitutional: She is oriented to person, place, and time. She appears well-developed and well-nourished. She appears distressed.  HENT:  Head: Normocephalic and atraumatic.  Right Ear: External ear normal.  Left Ear: External ear normal.  Nose: Nose normal.  Mouth/Throat: Oropharynx is clear and moist.  Eyes: Conjunctivae and EOM are normal. Pupils are equal, round, and reactive to light.  Neck: Normal range of motion. Neck supple.       Left neck and thyroid scars.  Cardiovascular: Normal rate.   Respiratory:       Marked inspiratory stridor.  Dyspneic.  GI:       Did not examine.  Genitourinary:       Did not examine.  Musculoskeletal: Normal range of motion.  Neurological: She is alert and oriented to person, place, and time. No cranial nerve deficit.  Skin: Skin is warm and dry.  Psychiatric: She has a normal mood and affect. Her behavior is normal. Judgment and thought content  normal.     Assessment/Plan Subglottic stenosis, appear inflammatory as opposed to due to intubation. To OR for awake tracheostomy followed by direct laryngoscopy.  Biopsy may be performed.  Her stenosis does not look amenable to dilation.  Risks, benefits, and alternatives  were discussed.  She will be admitted after surgery to a step-down or ICU bed.  [name] 01/02/2012, 4:47 PM

## 2012-01-03 LAB — GLUCOSE, CAPILLARY
Glucose-Capillary: 133 mg/dL — ABNORMAL HIGH (ref 70–99)
Glucose-Capillary: 137 mg/dL — ABNORMAL HIGH (ref 70–99)
Glucose-Capillary: 52 mg/dL — ABNORMAL LOW (ref 70–99)
Glucose-Capillary: 81 mg/dL (ref 70–99)

## 2012-01-03 LAB — ANA: Anti Nuclear Antibody(ANA): POSITIVE — AB

## 2012-01-03 MED ORDER — DEXTROSE 50 % IV SOLN
INTRAVENOUS | Status: AC
  Administered 2012-01-03: 50 mL via INTRAVENOUS
  Filled 2012-01-03: qty 50

## 2012-01-03 NOTE — Progress Notes (Signed)
Inpatient Diabetes Program Recommendations  AACE/ADA: New Consensus Statement on Inpatient Glycemic Control (2009)  Target Ranges:  Prepandial:   less than 140 mg/dL      Peak postprandial:   less than 180 mg/dL (1-2 hours)      Critically ill patients:  140 - 180 mg/dL   Reason for Visit: Results for [name], [name] (MRN [id number]) as of 01/03/2012 14:51  Ref. Range 01/03/2012 08:10 01/03/2012 08:29 01/03/2012 11:57 01/03/2012 12:43  Glucose-Capillary Latest Range: 70-99 mg/dL 409 (H) 811 (H) 52 (L) 152 (H)    Inpatient Diabetes Program Recommendations Correction (SSI): Consider adding Novolog sensitive correction tid with meals. Insulin - Meal Coverage: Hold Novolog meal coverage 5 units tid-  Note: Will follow.

## 2012-01-03 NOTE — Transfer of Care (Signed)
Immediate Anesthesia Transfer of Care Note  Patient: [name]  Procedure(s) Performed: Procedure(s) (LRB): TRACHEOSTOMY (N/A) DIRECT LARYNGOSCOPY (N/A)  Patient Location: PACU  Anesthesia Type: General  Level of Consciousness: awake, alert  and oriented  Airway & Oxygen Therapy: Patient Spontanous Breathing and Patient connected to tracheostomy mask oxygen  Post-op Assessment: Report given to PACU RN, Post -op Vital signs reviewed and stable and Patient moving all extremities  Post vital signs: Reviewed and stable  Complications: No apparent anesthesia complications

## 2012-01-03 NOTE — Progress Notes (Signed)
1 Day Post-Op  Subjective: Did well overnight.  Able to tolerate oral meds.  Some bloody oozing around trach site.  Breathing comfortably.  Objective: Vital signs in last 24 hours: Temp:  [97.4 F (36.3 C)-100.1 F (37.8 C)] 100.1 F (37.8 C) (03/12 0441) Pulse Rate:  [79-106] 79  (03/12 0825) Resp:  [14-33] 24  (03/12 0825) BP: (145-187)/(64-95) 178/76 mmHg (03/12 0825) SpO2:  [95 %-100 %] 100 % (03/12 0825) FiO2 (%):  [28 %] 28 % (03/12 0825)    Intake/Output from previous day: 03/11 0701 - 03/12 0700 In: 1200 [I.V.:1200] Out: 175 [Urine:175] Intake/Output this shift:    General appearance: alert, cooperative and no distress Neck: trach site stable with some clot around tube, easy breathing, makes low-pitched voice with tube occluded, does not exhale comfortably with tube occluded.  Lab Results:  No results found for this basename: WBC:2,HGB:2,HCT:2,PLT:2 in the last 72 hours BMET No results found for this basename: NA:2,K:2,CL:2,CO2:2,GLUCOSE:2,BUN:2,CREATININE:2,CALCIUM:2 in the last 72 hours PT/INR No results found for this basename: LABPROT:2,INR:2 in the last 72 hours ABG No results found for this basename: PHART:2,PCO2:2,PO2:2,HCO3:2 in the last 72 hours  Studies/Results: No results found.  Anti-infectives: Anti-infectives    None      Assessment/Plan: s/p Procedure(s) (LRB): TRACHEOSTOMY (N/A) DIRECT LARYNGOSCOPY (N/A) Doing well this morning with easy breathing.  Oozing around trach tube likely related to Plavix use.  Continue trach care.  I deflated her cuff.  Makes some voice but breathing difficult with tube occluded.  Not ready for a Passy-Muir valve.  Will start liquid diet.  Consider transfer to regular room later today.  LOS: 1 day    [name] 01/03/2012

## 2012-01-03 NOTE — Op Note (Signed)
[name], [name] NO.:  [id number]  MEDICAL RECORD NO.:  [id number]  LOCATION:  3308                         FACILITY:  [facility]  PHYSICIAN:  Antony Contras, MD     DATE OF BIRTH:  May 13, 1946  DATE OF PROCEDURE:  01/02/2012 DATE OF DISCHARGE:                              OPERATIVE REPORT   PREOPERATIVE DIAGNOSIS:  Subglottic stenosis.  POSTOPERATIVE DIAGNOSIS:  Subglottic stenosis.  PROCEDURE: 1. Emergency awake tracheostomy. 2. Microdirect laryngoscopy with biopsy.  SURGEON:  Excell Seltzer. Jenne Pane, M.D.  ANESTHESIA:  Local with sedation, followed by general anesthesia.  COMPLICATIONS:  None.  INDICATION:  The patient is a 66 year old African American female who has had a 1 month history of difficulty breathing that has varied in intensity, but reached its worst today when she had an office visit scheduled with one of my partners, Dr. Pollyann Kennedy.  At the office visit, she was found to be in respiratory distress with inspiratory stridor. Mirror exam revealed narrowing of the subglottic airway to a significant degree.  She was brought to the hospital by EMS.  From our office, brought quickly to the operating room for surgical management.  She has seen Dr. Sherene Sires of Pulmonology in the last few weeks, who thought her problem was an upper airway problem.  She did not respond to prednisone therapy apparently.  She was intubated about a year ago for 2 weeks because of pneumonia.  She denies any an autoimmune disease.  FINDINGS:  The patient was found to have a quiet narrow subglottic airway with fairly normal appearing vocal cords.  The subglottic airway was markedly edematous and erythematous.  There was an area of granulation tissue to the left posteriorly.  A biopsy was taken from this site.  DESCRIPTION OF PROCEDURE:  The patient was identified in the holding room and an informed consent having been obtained including discussion of risks, benefits, alternatives,  and the patient was brought to the office suite and put on the operating table in supine position.  She was then placed into a sitting position and oxygen was kept on her face.  A shoulder roll was placed.  The neck incision site which incorporated a portion of her thyroidectomy incision was then injected with 1% lidocaine with 1:100,000 epinephrine.  Neck was prepped and draped in sterile fashion.  The patient was given light sedation during the during the procedure.  Incision was then made with a #15 blade scalpel in a horizontal orientation.  Dissection was then performed through the subcutaneous tissues and through the strap muscles, which were tethered because of her previous thyroidectomy surgery.  The trachea was then swept of tissue without having to divide the thyroid isthmus.  A horizontal incision was then made between tracheal rings 2 and 3 using a #15 blade scalpel.  Incision was extended on each side using scissors. A single 2-0 suture was then placed around the ring above the tracheostomy site as a stay-suture.  A #6 cuffed Shiley trach tube was then placed into the trachea and the cuff inflated.  The inner cannula was placed and anesthesia circuit was hooked up to the tracheostomy tube.  She was then put under  general anesthesia.  The stay suture was tied with 2 knots.  The trach flange was then secured to the neck using 2-0 silk suture in 4 quadrants.  At this point, the bed was laid back down into a supine position and a shoulder roll removed.  The bed was turned 90 degrees from anesthesia.  The eyes were taped and closed.  A tooth guard was placed.  A Dedo laryngoscope was then inserted and used to evaluate the airway.  First, secretions were suctioned.  The laryngoscope was then placed into a glottic position and suspended to the Mayo stand using a Lewy arm.  A 0 degree telescope was used to make a photograph at the glottic position and 2 photos of the  subglottic position.  A biopsy was taken from the left posterior subglottic region using cup forceps.  This was placed in formalin and passed to nursing for Pathology.  The airway was then suctioned and the laryngoscope taken out of suspension and removed from the patient's mouth.  She was then returned to anesthesia for wake up.  The neck was cleaned off, and a trach dressing and trach tie were added.  The patient was then moved to the recovery room in stable condition.     Antony Contras, MD     DDB/MEDQ  D:  01/02/2012  T:  01/03/2012  Job:  [id number]

## 2012-01-04 LAB — GLUCOSE, CAPILLARY
Glucose-Capillary: 82 mg/dL (ref 70–99)
Glucose-Capillary: 183 mg/dL — ABNORMAL HIGH (ref 70–99)
Glucose-Capillary: 245 mg/dL — ABNORMAL HIGH (ref 70–99)

## 2012-01-04 NOTE — Progress Notes (Signed)
2 Days Post-Op  Subjective: Doing well.  Swallowing well.  Some tracheal secretions.  No significant bleeding.  Objective: Vital signs in last 24 hours: Temp:  [98.2 F (36.8 C)-102.6 F (39.2 C)] 99 F (37.2 C) (03/13 1140) Pulse Rate:  [68-86] 68  (03/13 1140) Resp:  [18-26] 19  (03/13 1140) BP: (117-155)/(42-71) 155/71 mmHg (03/13 1140) SpO2:  [92 %-100 %] 99 % (03/13 1140) FiO2 (%):  [28 %] 28 % (03/13 1140)    Intake/Output from previous day: 03/12 0701 - 03/13 0700 In: 2580 [P.O.:480; I.V.:2100] Out: 600 [Urine:600] Intake/Output this shift: Total I/O In: 980 [P.O.:480; I.V.:500] Out: -   General appearance: alert, cooperative and no distress Neck: Trach tube in place with no bleeding.  Cuff deflated.  Not able to move air around occluded trach tube.  Lab Results:  No results found for this basename: WBC:2,HGB:2,HCT:2,PLT:2 in the last 72 hours BMET No results found for this basename: NA:2,K:2,CL:2,CO2:2,GLUCOSE:2,BUN:2,CREATININE:2,CALCIUM:2 in the last 72 hours PT/INR No results found for this basename: LABPROT:2,INR:2 in the last 72 hours ABG No results found for this basename: PHART:2,PCO2:2,PO2:2,HCO3:2 in the last 72 hours  Studies/Results: No results found.  Anti-infectives: Anti-infectives    None      Assessment/Plan: s/p Procedure(s) (LRB): TRACHEOSTOMY (N/A) DIRECT LARYNGOSCOPY (N/A) Transfer to regular room.  Advance diet.  Lab testing negative for sarcoid or Wegener's.  ANA positive and ESR elevated.  LOS: 2 days    [name] 01/04/2012

## 2012-01-04 NOTE — Progress Notes (Signed)
Report given to patty RN  attempt to call daughter re new room number only recieved voice mail. Pt transferring to [id number] via wheelchair with belongings. [name]

## 2012-01-05 ENCOUNTER — Encounter (HOSPITAL_COMMUNITY): Payer: Self-pay | Admitting: *Deleted

## 2012-01-05 LAB — GLUCOSE, CAPILLARY
Glucose-Capillary: 125 mg/dL — ABNORMAL HIGH (ref 70–99)
Glucose-Capillary: 178 mg/dL — ABNORMAL HIGH (ref 70–99)

## 2012-01-05 MED ORDER — PANTOPRAZOLE SODIUM 40 MG PO TBEC
40.0000 mg | DELAYED_RELEASE_TABLET | Freq: Two times a day (BID) | ORAL | Status: DC
  Administered 2012-01-06 – 2012-01-10 (×9): 40 mg via ORAL
  Filled 2012-01-05 (×9): qty 1

## 2012-01-05 NOTE — Consult Note (Signed)
Requesting physician: Christia Reading, MD- ENT  Reason for consultation: Chronic Disease Management and w/u of Inflammatory Tracheal Stenosis  History of Present Illness: This is a 66 year old female with PMH of COPD, DM 2, CAD with CHF and MI, PAD and recent hospitalizations 2/21 and 2/7  for SOB, wheezing, thought to be upper airway issues/VCD and COPD presented with acute upper airway stridor obstruction and subsequently underwent an urgent tracheostomy placement by ENT. Upon direct laryngoscopy and visualization of the affected tracheal area during tracheostomy insertion ENT felt that this looked very inflammatory in nature. She does have a history of a two-week endotracheal intubation for COPD exacerbation and pneumonia 2 years ago but this stenosis was felt to not be related to scar tissue or injury from this prior condition. Status post tracheostomy placement the patient is doing well her breathing has improved dramatically, is unable to speak currently but is being evaluated for Passy-Muir valve. She has no complaints, denies pain, no joint stiffness or other pertinent history could be ascertained. We'll readdress her past medical history and pertinent complaints once she has a Passy-Muir valve and can communicate more effectively  Allergies:   Allergies  Allergen Reactions  . Crestor (Rosuvastatin Calcium) Other (See Comments)    Muscle Pain  . Lisinopril Other (See Comments)    "Lowers her BP too low"  . Vicodin (Hydrocodone-Acetaminophen) Nausea And Vomiting      Past Medical History  Diagnosis Date  . Diabetes mellitus     on Lantus 30 U   . MI (myocardial infarction)   . CAD (coronary artery disease)     S/p CABG and stenting  . S/P CABG (coronary artery bypass graft)   . PAD (peripheral artery disease)   . Stroke     MRI 11/2011 with remote occipital lobe. MRA with moderate left focal vertebral artery stenosis  . CHF (congestive heart failure) 11/2011    Echo with EF 30-35%,  global hypokinesis, and inferior akinesis  . Pneumonia   . Hypothyroidism     Past Surgical History  Procedure Date  . Ptca   . Thyroidectomy   . Coronary artery bypass graft     2 vessel  . Carotid endarterectomy ~2008    Left   . Cholecystectomy, laparoscopic less than 6 months ago  . Cholecystectomy     Scheduled Meds:   . albuterol  2.5 mg Nebulization QID  . aspirin EC  81 mg Oral Daily  . carvedilol  25 mg Oral BID WC  . clopidogrel  75 mg Oral Daily  . furosemide  40 mg Oral Daily  . insulin aspart  5 Units Subcutaneous TID AC  . insulin glargine  25 Units Subcutaneous QHS  . ipratropium  0.5 mg Nebulization QID  . isosorbide mononitrate  30 mg Oral QAC breakfast  . losartan  100 mg Oral Daily  . pantoprazole  40 mg Oral Q1200  . pregabalin  50 mg Oral BID  . simvastatin  40 mg Oral QHS   Continuous Infusions:   . 0.45 % NaCl with KCl 20 mEq / L 100 mL/hr at 01/04/12 0841   PRN Meds:.acetaminophen, acetaminophen, albuterol, loperamide, morphine, nitroGLYCERIN, promethazine  Social History:  reports that she quit smoking about a year ago. Her smoking use included Cigarettes. She has a 50 pack-year smoking history. She has never used smokeless tobacco. She reports that she does not drink alcohol or use illicit drugs.  History reviewed. No pertinent family history.  Review of Systems:  Unable to obtain complete review of systems due to patient ability to speak with her trach. Denies any chest pain, fevers, rash, or pain, night sweats, or other constitutional symptoms.  Physical Exam: Blood pressure 135/54, pulse 81, temperature 98.5 F (36.9 C), temperature source Oral, resp. rate 18, height 5' (1.524 m), weight 72.122 kg (159 lb), SpO2 91.00%.  Gen: Middle aged appearing, overweight but not obese F, sleeping comfortable HEENT: Pupils round, reactive, EOMI, sclera clear and normal. Mouth is moist and normal  Appearing- trach without bleeding or  drainage Lungs: With light inspiratory coarse breath sounds. Good air movement  Heart: regular, not tachycardic, but faint S1/2 and a bit hard to hear. No gross  murmurs appreciated  Abd: Overweight, but soft NT ND, benign exam  Extrem: Warm, perfusing normally, normal bulk and tone, no BLE edema noted.  Neuro: Alert and follows conversation. CN 2-12 intact, moves extremities on her own, can sit up in bed on her own. Grossly non-focal  Labs on Admission:  Results for orders placed during the hospital encounter of 01/02/12 (from the past 48 hour(s))  GLUCOSE, CAPILLARY     Status: Abnormal   Collection Time   01/03/12  5:12 PM      Component Value Range Comment   Glucose-Capillary 135 (*) 70 - 99 (mg/dL)    Comment 1 Notify RN     GLUCOSE, CAPILLARY     Status: Normal   Collection Time   01/03/12  9:59 PM      Component Value Range Comment   Glucose-Capillary 81  70 - 99 (mg/dL)    Comment 1 Notify RN      Comment 2 Documented in Chart     GLUCOSE, CAPILLARY     Status: Normal   Collection Time   01/04/12  8:03 AM      Component Value Range Comment   Glucose-Capillary 82  70 - 99 (mg/dL)    Comment 1 Documented in Chart      Comment 2 Notify RN     GLUCOSE, CAPILLARY     Status: Abnormal   Collection Time   01/04/12 11:40 AM      Component Value Range Comment   Glucose-Capillary 154 (*) 70 - 99 (mg/dL)    Comment 1 Documented in Chart      Comment 2 Notify RN     GLUCOSE, CAPILLARY     Status: Abnormal   Collection Time   01/04/12  5:25 PM      Component Value Range Comment   Glucose-Capillary 183 (*) 70 - 99 (mg/dL)    Comment 1 Notify RN     GLUCOSE, CAPILLARY     Status: Abnormal   Collection Time   01/04/12 10:22 PM      Component Value Range Comment   Glucose-Capillary 245 (*) 70 - 99 (mg/dL)   GLUCOSE, CAPILLARY     Status: Abnormal   Collection Time   01/05/12  8:05 AM      Component Value Range Comment   Glucose-Capillary 125 (*) 70 - 99 (mg/dL)    Comment 1  Notify RN     GLUCOSE, CAPILLARY     Status: Abnormal   Collection Time   01/05/12 12:34 PM      Component Value Range Comment   Glucose-Capillary 178 (*) 70 - 99 (mg/dL)    Comment 1 Notify RN       Assessment/Plan 1. Subglottic Stenosis: Given her history of prior intubation glottic injury from  this most likely, but would expect more of a chronic looking appearance, unless another process involved such as infection, inflammation, or chemical irritation.  -Biopsy pending -Could be irritation of existing area of scar from severe gastric reflux-start PPI BID -Wegners/sarcoidosis/autoimmune also in differential, ANA was positive, titer 1:40  was weakly positive, I doubt this of any clinical significance-would see higher level and specific pattern with most autoimmune disease, ACE neg -no know environmental irritants or recent UR viral infections-was on Advair which may have caused irritation from dry power  2. DM: Will check A1C, basal insulin and meal coverage controlling surgars well.  3. GERD: on once daily PPI, will increase dose  4. COPD: Stopped smoking, manganged on nebulizers at home.  5. CAD/CHF:Last 2D echo showed EF 35%, will follow a CXR, may need diuresis if develops vascular congestion, on good home regimen.  Appreciate Consult. Will continue to follow.   Time Spent on Consultation: 60 min  Shelley Cocke [facility]  Pager:[phone] 01/05/2012, 2:21 PM

## 2012-01-05 NOTE — Progress Notes (Signed)
Inpatient Diabetes Program Recommendations  AACE/ADA: New Consensus Statement on Inpatient Glycemic Control (2009)  Target Ranges:  Prepandial:   less than 140 mg/dL      Peak postprandial:   less than 180 mg/dL (1-2 hours)      Critically ill patients:  140 - 180 mg/dL   Results for LOTUS, SANTILLO (MRN [id number]) as of 01/05/2012 13:34  Ref. Range 01/04/2012 08:03 01/04/2012 11:40 01/04/2012 17:25 01/04/2012 22:22  Glucose-Capillary Latest Range: 70-99 mg/dL 82 409 (H) 811 (H) 914 (H)    Inpatient Diabetes Program Recommendations Correction (SSI): Consider adding Novolog sensitive correction tid with meals. Insulin - Meal Coverage: Currently on Novolog 5 units tid with meals.  Note: Will follow. Ambrose Finland RN, MSN, CDE Diabetes Coordinator Inpatient Diabetes Program [phone]

## 2012-01-05 NOTE — Progress Notes (Signed)
Patient is well known to [facility] Care Management.  Short term SNF placement is recommended to establish long term stability. I have contacted CM to discuss home environment.  For any additional questions or new referrals please contact Anibal Henderson BSN RN [facility] Liaison at [phone].

## 2012-01-05 NOTE — Progress Notes (Signed)
3 Days Post-Op  Subjective: Doing well.  Feels a bit light-headed this morning.  Breathing comfortably through trach tube.  Objective: Vital signs in last 24 hours: Temp:  [98.3 F (36.8 C)-101.2 F (38.4 C)] 98.5 F (36.9 C) (03/14 0625) Pulse Rate:  [68-88] 77  (03/14 0801) Resp:  [14-20] 18  (03/14 0740) BP: (135-184)/(52-73) 168/71 mmHg (03/14 0801) SpO2:  [92 %-100 %] 93 % (03/14 0801) FiO2 (%):  [28 %-82 %] 28 % (03/14 0801) Weight:  [72.122 kg (159 lb)] 72.122 kg (159 lb) (03/13 1140)    Intake/Output from previous day: 03/13 0701 - 03/14 0700 In: 1240 [P.O.:540; I.V.:700] Out: 1351 [Urine:1350; Stool:1] Intake/Output this shift:    General appearance: alert, cooperative and no distress Neck: trach site stable with no bleeding.  Cuff deflated.  Still not able to move air around occluded trach tube.  Lab Results:  No results found for this basename: WBC:2,HGB:2,HCT:2,PLT:2 in the last 72 hours BMET No results found for this basename: NA:2,K:2,CL:2,CO2:2,GLUCOSE:2,BUN:2,CREATININE:2,CALCIUM:2 in the last 72 hours PT/INR No results found for this basename: LABPROT:2,INR:2 in the last 72 hours ABG No results found for this basename: PHART:2,PCO2:2,PO2:2,HCO3:2 in the last 72 hours  Studies/Results: No results found.  Anti-infectives: Anti-infectives    None      Assessment/Plan: s/p Procedure(s) (LRB): TRACHEOSTOMY (N/A) DIRECT LARYNGOSCOPY (N/A) Breathing comfortably with stable trach tube.  Will change to uncuffed tube tomorrow.  Still would not tolerate Passy-Muir valve.  Will request internal medicine consult to continue to help with workup of her problem.  LOS: 3 days    Shandra Szymborski 01/05/2012

## 2012-01-06 ENCOUNTER — Inpatient Hospital Stay (HOSPITAL_COMMUNITY): Payer: Medicare Other

## 2012-01-06 LAB — GLUCOSE, CAPILLARY
Glucose-Capillary: 139 mg/dL — ABNORMAL HIGH (ref 70–99)
Glucose-Capillary: 270 mg/dL — ABNORMAL HIGH (ref 70–99)
Glucose-Capillary: 186 mg/dL — ABNORMAL HIGH (ref 70–99)

## 2012-01-06 MED ORDER — INSULIN ASPART 100 UNIT/ML ~~LOC~~ SOLN: 4.0000 [IU] | Freq: Three times a day (TID) | SUBCUTANEOUS | Status: DC

## 2012-01-06 MED ORDER — INSULIN ASPART 100 UNIT/ML ~~LOC~~ SOLN
0.0000 [IU] | Freq: Every day | SUBCUTANEOUS | Status: DC
  Administered 2012-01-07: 2 [IU] via SUBCUTANEOUS

## 2012-01-06 MED ORDER — MOXIFLOXACIN HCL IN NACL 400 MG/250ML IV SOLN
400.0000 mg | INTRAVENOUS | Status: DC
  Administered 2012-01-06: 400 mg via INTRAVENOUS
  Filled 2012-01-06 (×2): qty 250

## 2012-01-06 MED ORDER — SODIUM CHLORIDE 0.9 % IV SOLN
INTRAVENOUS | Status: DC
  Administered 2012-01-06: 20 mL via INTRAVENOUS

## 2012-01-06 MED ORDER — INSULIN ASPART 100 UNIT/ML ~~LOC~~ SOLN
0.0000 [IU] | Freq: Three times a day (TID) | SUBCUTANEOUS | Status: DC
  Administered 2012-01-06 – 2012-01-07 (×3): 3 [IU] via SUBCUTANEOUS
  Administered 2012-01-08: 11 [IU] via SUBCUTANEOUS
  Administered 2012-01-09: 2 [IU] via SUBCUTANEOUS
  Administered 2012-01-10 (×2): 3 [IU] via SUBCUTANEOUS

## 2012-01-06 MED ORDER — FUROSEMIDE 10 MG/ML IJ SOLN
40.0000 mg | Freq: Once | INTRAMUSCULAR | Status: AC
  Administered 2012-01-06: 40 mg via INTRAVENOUS
  Filled 2012-01-06: qty 4

## 2012-01-06 NOTE — Progress Notes (Signed)
Inpatient Diabetes Program Recommendations  AACE/ADA: New Consensus Statement on Inpatient Glycemic Control (2009)  Target Ranges:  Prepandial:   less than 140 mg/dL      Peak postprandial:   less than 180 mg/dL (1-2 hours)      Critically ill patients:  140 - 180 mg/dL   Reason for Visit:  Hyperglycemia following meals  Inpatient Diabetes Program Recommendations Correction (SSI): Please add sensitive or even moderate correction tidwc (in addition to meal coverage) Insulin - Meal Coverage: xx  Note: Thank you, Lenor Coffin, RN, CNS, Diabetes Coordinator [phone])

## 2012-01-06 NOTE — Progress Notes (Signed)
4 Days Post-Op  Subjective: Continues to breathe comfortably.  Tolerating po's well.  Appreciate input from hospitalist.  Objective: Vital signs in last 24 hours: Temp:  [98.5 F (36.9 C)-100 F (37.8 C)] 100 F (37.8 C) (03/15 0520) Pulse Rate:  [73-84] 76  (03/15 0520) Resp:  [14-28] 20  (03/15 0520) BP: (135-167)/(53-64) 167/64 mmHg (03/15 0520) SpO2:  [90 %-98 %] 98 % (03/15 0833) FiO2 (%):  [28 %] 28 % (03/15 0833) Last BM Date: 01/05/12  Intake/Output from previous day: 03/14 0701 - 03/15 0700 In: 1202 [P.O.:480; I.V.:722] Out: 875 [Urine:875] Intake/Output this shift:    General appearance: alert, cooperative and no distress Neck: trach tube in place, stable.  Still unable to move air around occluded tube.  Lab Results:  No results found for this basename: WBC:2,HGB:2,HCT:2,PLT:2 in the last 72 hours BMET No results found for this basename: NA:2,K:2,CL:2,CO2:2,GLUCOSE:2,BUN:2,CREATININE:2,CALCIUM:2 in the last 72 hours PT/INR No results found for this basename: LABPROT:2,INR:2 in the last 72 hours ABG No results found for this basename: PHART:2,PCO2:2,PO2:2,HCO3:2 in the last 72 hours  Studies/Results: Dg Chest Port 1 View  01/06/2012  *RADIOLOGY REPORT*  Clinical Data: CHF.  Shortness of breath.  PORTABLE CHEST - 1 VIEW  Comparison: 12/20/2011  Findings: Tracheostomy appropriately positioned.  Patient rotated left. Prior median sternotomy.  Mild cardiomegaly. No pleural effusion or pneumothorax.  Interstitial thickening is moderate and progressive.  Right greater than left lower lobe predominant airspace disease.  IMPRESSION: Worsened aeration.  Development of moderate congestive heart failure.  Concurrent right greater than left lower lobe predominant airspace disease.  Favor alveolar edema or atelectasis.  Original Report Authenticated By: Consuello Bossier, M.D.    Anti-infectives: Anti-infectives    None      Assessment/Plan: s/p Procedure(s)  (LRB): TRACHEOSTOMY (N/A) DIRECT LARYNGOSCOPY (N/A) Trach site stable.  Will change to cuffless trach tube today.  Appreciate hospitalist input and agree with increase in PPI dosing.  Discharge planning may include SNF.  Patient is still completely dependent on trach tube to breathe and unable to make voice or tolerate Passy-Muir valve.  LOS: 4 days    [name] 01/06/2012

## 2012-01-06 NOTE — Progress Notes (Signed)
Patient ID: [name], female   DOB: May 09, 1946, 66 y.o.   MRN: [id number] Addendum: Trach tube changed to uncuffed #6 Shiley trach tube at bedside without difficulty.  Able to make some voice around this tube when occluded.  Will order Passy-Muir valve trial.

## 2012-01-06 NOTE — Progress Notes (Signed)
Speech Pathology  PMSV (Passy-Muir Speaking Valve) Evaluation  I. HPI:  Pt is a 66 year old female admitted with shortness of breath due to narrowed subglottic airway with inflamed appearance. Pt underwent tracheostomy on 01/02/12, was downsized to 6 cuffless today 3/15 and was able to achieve voice with finger occlusion by MD. PMH: Hospitalization 4/12 for one month with two weeks with ETT on ventilator, COPD, DM, MI, CAD s/p CABG, PAD, CVA, CHF, pna, hyposthyroidism  II. Tracheostomy Tube  Initial trach Placement date: 3/11  Trach collar period: 24hrs/day Type: Shiley    Size: 6     FiO2: 24%  Cuff: no Fenestration:  no Secretion description: clear/whitish, thin Frequency of tracheal suctioning:1 per daily Level of secretion expectoration: tracheal  II. Ventilator Dependency  no    V. PMSV Trial PMSV was placed for 30 minutes. Able to redirect subglottic air through upper airway: yes Able to attain phonation with PMSV: yes Able to expectorate secretions with PMSV: yes, leaked from stoma under trach Breath Support for phonation: adequate Intelligibiity: 100% RR: WFL SpO2: 96-99 HR:70  Behavior: alert  VI. Clinical Impression:  Pt tolerates valve placement with adequate redirection of air to upper airway for exhalation and phonation. No evidence of CO2 trapping, change in vital signs or any respiratory distress or complaints of discomfort. Pt demonstrated ability to remove and place valve independently after SLP instruction and visual/verbal feedback. Pt verbalized instructions regarding safety with valve. Pt is maintains alertness with no cognitive deficits. Pt is safe to wear valve during all waking hours with intermittent supervision from staff.   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 Recommendations  1. Patient to use PMSV during all waking hours with intermittent supervision from staff.    VIII. Treatment Plan  yes Frequency/Duration: 2 x a week Short Term  Goals Pt will tolerate valve placement during all waking hours with no overt difficulty.  Pt will utilize adequate breath support for loud verbalization at conversation level with min verbal cues.   Pain: no  Harlon Ditty, MA CCC-SLP [phone]

## 2012-01-06 NOTE — Progress Notes (Signed)
Subjective: Doing well, no complaints.  Objective: Vital signs in last 24 hours: Temp:  [99.2 F (37.3 C)-100.9 F (38.3 C)] 100.9 F (38.3 C) (03/15 1441) Pulse Rate:  [73-84] 80  (03/15 1441) Resp:  [14-28] 20  (03/15 1441) BP: (115-167)/(47-64) 115/47 mmHg (03/15 1441) SpO2:  [28 %-98 %] 93 % (03/15 1441) FiO2 (%):  [28 %] 28 % (03/15 1441) Weight change:  Last BM Date: 01/05/12  Intake/Output from previous day: 03/14 0701 - 03/15 0700 In: 1202 [P.O.:480; I.V.:722] Out: 875 [Urine:875] Total I/O In: -  Out: 550 [Urine:550]   Physical Exam: General: Alert, awake, oriented x3, in no acute distress. HEENT: No bruits, no goiter. Trach in place with PMV valve, no purulent sputum  Heart: Regular rate and rhythm, without murmurs, rubs, gallops. Lungs: Course rhonchi Abdomen: Soft, nontender, nondistended, positive bowel sounds. Extremities: No clubbing cyanosis- 1+ edema with positive pedal pulses. Neuro: Grossly intact, nonfocal.   CBG:  Basename 01/06/12 1154 01/06/12 0736 01/05/12 2141 01/05/12 1727 01/05/12 1234 01/05/12 0805  GLUCAP 270* 139* 277* 209* 178* 125*    Drugs of Abuse     Component Value Date/Time   LABOPIA NONE DETECTED 11/26/2011 1937   COCAINSCRNUR NONE DETECTED 11/26/2011 1937   LABBENZ NONE DETECTED 11/26/2011 1937   AMPHETMU NONE DETECTED 11/26/2011 1937   THCU NONE DETECTED 11/26/2011 1937   LABBARB NONE DETECTED 11/26/2011 1937    Studies/Results: Dg Chest Port 1 View  01/06/2012  *RADIOLOGY REPORT*  Clinical Data: CHF.  Shortness of breath.  PORTABLE CHEST - 1 VIEW  Comparison: 12/20/2011  Findings: Tracheostomy appropriately positioned.  Patient rotated left. Prior median sternotomy.  Mild cardiomegaly. No pleural effusion or pneumothorax.  Interstitial thickening is moderate and progressive.  Right greater than left lower lobe predominant airspace disease.  IMPRESSION: Worsened aeration.  Development of moderate congestive heart failure.  Concurrent  right greater than left lower lobe predominant airspace disease.  Favor alveolar edema or atelectasis.  Original Report Authenticated By: Consuello Bossier, M.D.   Medications: Scheduled Meds:   . albuterol  2.5 mg Nebulization QID  . aspirin EC  81 mg Oral Daily  . carvedilol  25 mg Oral BID WC  . clopidogrel  75 mg Oral Daily  . furosemide  40 mg Oral Daily  . insulin aspart  5 Units Subcutaneous TID AC  . insulin glargine  25 Units Subcutaneous QHS  . ipratropium  0.5 mg Nebulization QID  . isosorbide mononitrate  30 mg Oral QAC breakfast  . losartan  100 mg Oral Daily  . pantoprazole  40 mg Oral BID AC  . pregabalin  50 mg Oral BID  . simvastatin  40 mg Oral QHS  . DISCONTD: pantoprazole  40 mg Oral Q1200   Continuous Infusions:   . 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 Biopsy/Pathology results returned as are as follows:  Larynx, biopsy, Left subglottic - BENIGN PARTIALLY DENUDED SUPERFICIAL SQUAMOUS AND RESPIRATORY MUCOSA WITH REACTIVE CHANGES AND MILD CHRONIC INFLAMMATION. - PAS IS NEGATIVE FOR FUNGAL ORGANISMS. - NO DYSPLASIA OR MALIGNANCY IDENTIFIED  2. GERD: Still favor reflux as etiology although I cannot explain ESR elevation at 80 other than from active infection (possible PNA).  3. Possible PNA vs. Pulmonary edema: Chest Xray concerning for PNA. She has new fever 100.9, sats  lower, probably some pulmonary edema now too. Will start her on broad spectrum antibiotics and de-escalate to an oral regimen quinolone if she improves quickly. Repeat CXR in AM. Will give one dose of IV Lasix. If temp spikes again will get blood cultures.  PORTABLE CHEST - 1 VIEW  Comparison: 12/20/2011  Findings: Tracheostomy appropriately positioned. Patient rotated  left. Prior  median sternotomy. Mild cardiomegaly. No pleural  effusion or pneumothorax. Interstitial thickening is moderate and  progressive. Right greater than left lower lobe predominant  airspace disease.  IMPRESSION:  Worsened aeration. Development of moderate congestive heart  failure.  Concurrent right greater than left lower lobe predominant airspace  disease. Favor alveolar edema or atelectasis.  4. DM2: Will add meal coverage since she is eating more post op-also sugars may be higher because of underlying infection.       LOS: 4 days   [facility] Pager: [phone] 01/06/2012, 3:29 PM    1

## 2012-01-07 ENCOUNTER — Inpatient Hospital Stay (HOSPITAL_COMMUNITY): Payer: Medicare Other

## 2012-01-07 LAB — BASIC METABOLIC PANEL
CO2: 28 mEq/L (ref 19–32)
Calcium: 8.4 mg/dL (ref 8.4–10.5)
Creatinine, Ser: 1.25 mg/dL — ABNORMAL HIGH (ref 0.50–1.10)
GFR calc non Af Amer: 44 mL/min — ABNORMAL LOW (ref >90)
Sodium: 138 mEq/L (ref 135–145)

## 2012-01-07 LAB — CBC
MCH: 29.5 pg (ref 26.0–34.0)
MCV: 92.5 fL (ref 78.0–100.0)
Platelets: 172 10*3/uL (ref 150–400)
RDW: 14.4 % (ref 11.5–15.5)
WBC: 8.2 10*3/uL (ref 4.0–10.5)

## 2012-01-07 LAB — GLUCOSE, CAPILLARY
Glucose-Capillary: 53 mg/dL — ABNORMAL LOW (ref 70–99)
Glucose-Capillary: 82 mg/dL (ref 70–99)
Glucose-Capillary: 160 mg/dL — ABNORMAL HIGH (ref 70–99)

## 2012-01-07 LAB — HEMOGLOBIN A1C: Mean Plasma Glucose: 312 mg/dL — ABNORMAL HIGH (ref <117)

## 2012-01-07 MED ORDER — LEVOFLOXACIN 250 MG PO TABS
250.0000 mg | ORAL_TABLET | ORAL | Status: DC
  Administered 2012-01-07 – 2012-01-10 (×4): 250 mg via ORAL
  Filled 2012-01-07 (×5): qty 1

## 2012-01-07 MED ORDER — FUROSEMIDE 40 MG PO TABS
60.0000 mg | ORAL_TABLET | Freq: Every day | ORAL | Status: DC
  Administered 2012-01-08 – 2012-01-10 (×3): 60 mg via ORAL
  Filled 2012-01-07 (×3): qty 1

## 2012-01-07 MED ORDER — LEVOFLOXACIN 500 MG PO TABS
500.0000 mg | ORAL_TABLET | Freq: Every day | ORAL | Status: DC
  Filled 2012-01-07: qty 1

## 2012-01-07 NOTE — Progress Notes (Addendum)
Subjective: Doing well, no complaints.  Objective: Vital signs in last 24 hours: Temp:  [98.4 F (36.9 C)-100.9 F (38.3 C)] 98.4 F (36.9 C) (03/16 0625) Pulse Rate:  [66-80] 66  (03/16 0625) Resp:  [16-20] 16  (03/16 0625) BP: (98-138)/(47-56) 98/56 mmHg (03/16 0625) SpO2:  [28 %-98 %] 98 % (03/16 0854) FiO2 (%):  [28 %] 28 % (03/16 0854) Weight change:  Last BM Date: 01/06/12  Intake/Output from previous day: 03/15 0701 - 03/16 0700 In: 240 [P.O.:240] Out: 1051 [Urine:1051] Total I/O In: 300 [P.O.:300] Out: -    Physical Exam: General: Alert, awake, oriented x3, in no acute distress. HEENT: No bruits, no goiter. Trach in place with PMV valve, no purulent sputum  Heart: Regular rate and rhythm, without murmurs, rubs, gallops. Lungs: Course rhonchi Abdomen: Soft, nontender, nondistended, positive bowel sounds. Extremities: No clubbing cyanosis- 1+ edema with positive pedal pulses. Neuro: Grossly intact, nonfocal.   CBG:  Basename 01/06/12 2210 01/06/12 1731 01/06/12 1154 01/06/12 0736 01/05/12 2141 01/05/12 1727  GLUCAP 158* 186* 270* 139* 277* 209*    Drugs of Abuse     Component Value Date/Time   LABOPIA NONE DETECTED 11/26/2011 1937   COCAINSCRNUR NONE DETECTED 11/26/2011 1937   LABBENZ NONE DETECTED 11/26/2011 1937   AMPHETMU NONE DETECTED 11/26/2011 1937   THCU NONE DETECTED 11/26/2011 1937   LABBARB NONE DETECTED 11/26/2011 1937    Studies/Results: Dg Chest Port 1 View  01/07/2012  *RADIOLOGY REPORT*  Clinical Data: Chronic ventilator dependent respiratory failure. Follow up pulmonary edema and associated atelectasis versus pneumonia.  PORTABLE CHEST - 1 VIEW 01/07/2012 0837 hours:  Comparison: Portable chest x-ray yesterday and 12/15/2011.  Two- view chest x-ray 12/18/2011.  Findings: Tracheostomy tube tip in satisfactory position approximately 6 cm above the carina.  Prior sternotomy.  Cardiac silhouette enlarged but stable.  Interval resolution of interstitial  pulmonary edema.  Improved aeration in the lung bases, with only mild atelectasis persisting.  No new pulmonary parenchymal abnormalities.  IMPRESSION: Support apparatus satisfactory.  Stable cardiomegaly.  Resolution of pulmonary edema.  Improved aeration in the lung bases, with only mild atelectasis persisting.  No new abnormality.  Original Report Authenticated By: Arnell Sieving, M.D.   Dg Chest Port 1 View  01/06/2012  *RADIOLOGY REPORT*  Clinical Data: CHF.  Shortness of breath.  PORTABLE CHEST - 1 VIEW  Comparison: 12/20/2011  Findings: Tracheostomy appropriately positioned.  Patient rotated left. Prior median sternotomy.  Mild cardiomegaly. No pleural effusion or pneumothorax.  Interstitial thickening is moderate and progressive.  Right greater than left lower lobe predominant airspace disease.  IMPRESSION: Worsened aeration.  Development of moderate congestive heart failure.  Concurrent right greater than left lower lobe predominant airspace disease.  Favor alveolar edema or atelectasis.  Original Report Authenticated By: Consuello Bossier, M.D.   Medications: Scheduled Meds:    . albuterol  2.5 mg Nebulization QID  . aspirin EC  81 mg Oral Daily  . carvedilol  25 mg Oral BID WC  . clopidogrel  75 mg Oral Daily  . furosemide  40 mg Intravenous Once  . furosemide  40 mg Oral Daily  . insulin aspart  0-15 Units Subcutaneous TID WC  . insulin aspart  0-5 Units Subcutaneous QHS  . insulin aspart  5 Units Subcutaneous TID AC  . insulin glargine  25 Units Subcutaneous QHS  . ipratropium  0.5 mg Nebulization QID  . isosorbide mononitrate  30 mg Oral QAC breakfast  . losartan  100 mg Oral Daily  . moxifloxacin  400 mg Intravenous Q24H  . pantoprazole  40 mg Oral BID AC  . pregabalin  50 mg Oral BID  . simvastatin  40 mg Oral QHS  . DISCONTD: insulin aspart  4 Units Subcutaneous TID WC   Continuous Infusions:    . sodium chloride 20 mL (01/06/12 1559)   PRN Meds:.acetaminophen,  acetaminophen, albuterol, loperamide, morphine, nitroGLYCERIN, promethazine  Assessment/Plan:  1. Subglottic Stenosis: Patient still doing well with trach and PV. Biopsy/Pathology results returned as are as follows:  Larynx, biopsy, Left subglottic - BENIGN PARTIALLY DENUDED SUPERFICIAL SQUAMOUS AND RESPIRATORY MUCOSA WITH REACTIVE CHANGES AND MILD CHRONIC INFLAMMATION. - PAS IS NEGATIVE FOR FUNGAL ORGANISMS. - NO DYSPLASIA OR MALIGNANCY IDENTIFIED  2. GERD: Still favor reflux as etiology although I cannot explain ESR elevation at 80 other than from active infection (possible PNA).  3. Possible PNA vs. Pulmonary edema: Resolution of pulmonary edema. Improved aeration in the lung bases, with only mild atelectasis persisting. Will change to an oral antibiotics regimen/Levaquin for 5 days. Will increase oral lasix to 60mg  daily.   4. DM2: Better control. Continue current regimen  5. Medically stable for discharge when ENT issues resolved. Rec on discharge above changes.   LOS: 5 days   [facility] Pager: [phone] 01/07/2012, 11:23 AM    1

## 2012-01-07 NOTE — Progress Notes (Signed)
Patient is doing well. She is tolerating the Passy-Muir valve to some degree. She is eating without aspiration. She still seems to be dependent on the trach and it will be left in place for continued management until such time she is ready for decannulation or discharge with the trach. Continue care

## 2012-01-07 NOTE — Progress Notes (Signed)
Pharmacy- Dose adjustment for decrease renal fxn.  66yo female estimated CrCl ~78ml/min with order for Levofloxacin 500mg  q24 x 5 doses.  Will decrease dose to 250mg  q24 x 5 doses  Marisue Humble PPharmD 01/07/2012

## 2012-01-08 LAB — GLUCOSE, CAPILLARY
Glucose-Capillary: 120 mg/dL — ABNORMAL HIGH (ref 70–99)
Glucose-Capillary: 304 mg/dL — ABNORMAL HIGH (ref 70–99)
Glucose-Capillary: 192 mg/dL — ABNORMAL HIGH (ref 70–99)

## 2012-01-08 MED ORDER — ACETAMINOPHEN 325 MG PO TABS: 650.0000 mg | ORAL_TABLET | Freq: Four times a day (QID) | ORAL | Status: DC | PRN

## 2012-01-08 NOTE — Progress Notes (Signed)
He is doing better. She is taking fluids without problems. She's wearing her Passy-Muir valve and her speech is much better. Her secretions are decreased. Examination the trach is intact in position and good airway. The valve is in position. No neck swelling or masses. Her voice sounds good. She will continue care and her disposition will be determined tomorrow by Dr. Jenne Pane. She was changed to a liquid pain medication and her IV is discontinued rather than restart it.

## 2012-01-08 NOTE — Progress Notes (Signed)
Pt is doing well with her trach and PMuir valve, speech is clear. Reviewed trach care with her as she watched with a mirror. Tolerating diet without any complications.   Pt lives with her daughter. Daughter stated that pt was receiving services from [facility] prior to admission.    Trach team consult placed.

## 2012-01-08 NOTE — Progress Notes (Signed)
Patient is medically stable. At discharge would recommend: Levaquin 250mg  PO for 5 days  Lasix 60mg  daily (new dose) Protonix 40mg  twice daily Will need follow up CXR in 4-6 weeks to make sure there is interval improvement. Otherwise per her previous regimen.  Please call with questions or concerns-[phone].  Anderson Malta, DO Internal Medicine

## 2012-01-08 NOTE — Progress Notes (Signed)
Speech Language/Pathology Speech Language Pathology Treatment Patient Details Name: [name] MRN: [id number] DOB: [birth year] Today's Date: 01/08/2012  SLP Assessment/Plan/Recommendation  Patient seen by ST for diagnostic treatment for PMV tolerance following initial PMV evaluation on 01/06/12.  Patient stated her throat hurt at an 8 . SLP contacted RN prior to treatment.  Patient's daughter present during treatment and voiced concern with receiving more "hands on training" with PMV placement and removal.  Patient and daughter given written skilled information concerning above.  Patient with 100% intelligibility during conversational speech with SLP but vocal quality noted to be harsh and hoarse.  Patient's daughter stated that vocal quality was baseline prior to trach. Air leakage from stoma noted during exhalation.  SLP provided a hand mirror for patient to aid in placing valve per patient request.  ST continue to follow in acute care setting.   SLP Goals   1.  Patient tolerating valve placement during all waking hours with no overt difficulty.  Progressing toward goal. 2.  Patient will utilize adequate breath support for loud verbalization at conversation level with min verbal cues. Progressing toward goals.   Moreen Fowler M.S., CCC-SLP [phone] [facility] 01/08/2012, 7:20 PM

## 2012-01-09 ENCOUNTER — Encounter (HOSPITAL_COMMUNITY): Payer: Self-pay | Admitting: Otolaryngology

## 2012-01-09 LAB — GLUCOSE, CAPILLARY
Glucose-Capillary: 129 mg/dL — ABNORMAL HIGH (ref 70–99)
Glucose-Capillary: 155 mg/dL — ABNORMAL HIGH (ref 70–99)

## 2012-01-09 MED ORDER — LEVOFLOXACIN 250 MG PO TABS: 250.0000 mg | ORAL_TABLET | ORAL | Status: DC

## 2012-01-09 MED ORDER — FUROSEMIDE 20 MG PO TABS: 60.0000 mg | ORAL_TABLET | Freq: Every day | ORAL | Status: DC

## 2012-01-09 MED ORDER — PANTOPRAZOLE SODIUM 40 MG PO TBEC: 40.0000 mg | DELAYED_RELEASE_TABLET | Freq: Two times a day (BID) | ORAL | Status: DC

## 2012-01-09 NOTE — Progress Notes (Signed)
Speech Language/Pathology Speech Pathology  PMSV (Passy-Muir Speaking Valve) Treatment Note  I. Tracheostomy Tube  Trach collar period: 24 hours/day Type: Shiley    Size: 6      Cuff: no Fenestration:  no Secretion description: thin, white from stoma under trach.  Frequency of tracheal suctioning:1 per daily Level of secretion expectoration: trach  II. Ventilator Dependency  no    IV. PMSV Trial PMSV was observed in place for 30 minutes. Pt with valve in place on arrival and at end of session Able to redirect subglottic air through upper airway: yes Able to attain phonation with PMSV: yes Able to expectorate secretions with PMSV: yes Breath Support for phonation: adequate Intelligibiity: 75-100% RR: NT SpO2: 100 HR:68-70  Behavior: anxious and tearful  V. Clinical Impression:  Pt continues to demonstrate tolerance of valve which she is wearing during all waking hours. With prompt pt was able to remove valve independently using mirror and replace. The pt verbalized understanding that valve should be removed for naps and sleep. SLP provided further education regarding placement and cleaning of valve.    The pt was tearful throughout session because she feels that the hospital is discharging her prematurely without recieving multiple therapy sessions regarding home trach care. The pt is very anxious regarding care of her trach and does not seem to truly understand its parts and function yet. Respiratory therapy conducted extensive education with her this am but pt would like another session, especially with dtr present. Spoke with respiratory who states they would be able to come when RN calls to say dtr is present. Dr. Jenne Pane also reports that though the pt is medically ready for discharge the pt may stay another night though case management is needed to determine if this is possible.   The pt expressed anger with her care and felt that the process of education and discharge has been  conducted hastily. She stated "its all about money."  SLP reported concerns to RN who plans to follow with the pt regarding these needs.   VII. Recommendations 1. Patient to use PMSV during all waking hours.    VIII. Treatment Plan  yes Frequency/Duration:  Short Term Goals Pt will continue to demonstrate tolerance and independence with use of PMSV. SLP will continue to follow 1x a week for education as needed. Please page with concerns.  Harlon Ditty, MA CCC-SLP [phone]  Pain: no

## 2012-01-09 NOTE — Discharge Summary (Signed)
Physician Discharge Summary  Patient ID: [name] MRN: [id number] DOB/AGE: May 06, 1946 66 y.o.  Admit date: 01/02/2012 Discharge date: 01/09/2012  Admission Diagnoses: Subglottic stenosis, stridor, hoarseness, diabetes, congestive heart failure  Discharge Diagnoses:  Principal Problem:  *Subglottic stenosis stridor, hoarseness, diabetes, congestive heart failure  Discharged Condition: good  Hospital Course: Admitted 3/11 in acute respiratory distress due to inflammatory subglottic stenosis.  Take to OR for emergent tracheostomy and direct laryngoscopy.  Observed in step-down unit after surgery and did well.  Transferred to regular room two days after trach placement.  Labs for cause of subglottic stenosis were non-specific.  Medicine consult obtained and helped with medical management.  Trach tube changed to cuffless trach tube on POD 4 without difficulty.  With trach change, she was able to make voice around obstructed tube.  Passy-Muir valve placed and has done well for the last few days using the valve.  Started on antibiotics for possible pneumonia a couple of days ago and already changed to oral Levaquin.  Today, felt stable for discharge.  Intensive trach care instruction planned for today and home health to be arranged.  Consults: Internal medicine  Significant Diagnostic Studies: labs: autoimmune  Treatments: surgery: tracheostomy, direct laryngoscopy Antibiotic therapy  Discharge Exam: Blood pressure 144/54, pulse 65, temperature 98.2 F (36.8 C), temperature source Oral, resp. rate 19, height 5' (1.524 m), weight 72.122 kg (159 lb), SpO2 96.00%. General appearance: alert, cooperative and no distress Neck: #6 cuffless Shiley in place with Passy-Muir valve.  Good voice with some air leaking around trach tube.  Some tan secretions.  No stridor.  Disposition: 01-Home or Self Care  Discharge Orders    Future Appointments: Provider: Department: Dept Phone: Center:   02/02/2012  1:30 PM [facility] [phone] [facility]   02/02/2012 2:30 PM Sherren Kerns, MD [facility] [phone] [facility]     Future Orders Please Complete By Expires   Diet - low sodium heart healthy      Increase activity slowly      Discharge instructions      Comments:   Clean around trach site twice daily with half strength peroxide and apply Bacitracin or Neosporin ointment.  Clean trach tube inner cannula at least twice daily with half strength peroxide using a pipe cleaner.  Wear Passy-Muir valve on trach tube to help you talk and swallow but do not wear it when sleeping.     Medication List  As of 01/09/2012  9:00 AM   STOP taking these medications         dexlansoprazole 60 MG capsule      furosemide 40 MG tablet         TAKE these medications         acetaminophen 500 MG tablet   Commonly known as: TYLENOL   Take 500 mg by mouth every 6 (six) hours as needed. For pain      albuterol (5 MG/ML) 0.5% nebulizer solution   Commonly known as: PROVENTIL   Take 2.5 mg by nebulization 4 (four) times daily.      aspirin EC 81 MG tablet   Take 81 mg by mouth daily.      carvedilol 25 MG tablet   Commonly known as: COREG   Take 25 mg by mouth 2 (two) times daily with a meal.      clopidogrel 75 MG tablet   Commonly known as: PLAVIX   Take 75 mg by mouth daily.      famotidine  20 MG tablet   Commonly known as: PEPCID   Take 20 mg by mouth at bedtime.      furosemide 20 MG tablet   Commonly known as: LASIX   Take 3 tablets (60 mg total) by mouth daily.      insulin aspart 100 UNIT/ML injection   Commonly known as: novoLOG   Inject 7 Units into the skin 3 (three) times daily before meals.      insulin glargine 100 UNIT/ML injection   Commonly known as: LANTUS   Inject 22 Units into the skin at bedtime.      ipratropium 0.02 % nebulizer solution   Commonly known as: ATROVENT   Take 0.5 mg by nebulization 4 (four) times daily.      isosorbide mononitrate 60  MG 24 hr tablet   Commonly known as: IMDUR   Take 30 mg by mouth every morning.      levofloxacin 250 MG tablet   Commonly known as: LEVAQUIN   Take 1 tablet (250 mg total) by mouth daily.      loperamide 2 MG capsule   Commonly known as: IMODIUM   Take 4 mg by mouth 4 (four) times daily as needed. For diarrhea      losartan 100 MG tablet   Commonly known as: COZAAR   Take 1 tablet (100 mg total) by mouth daily.      mometasone 50 MCG/ACT nasal spray   Commonly known as: NASONEX   Place 2 sprays into the nose at bedtime.      nitroGLYCERIN 0.4 MG SL tablet   Commonly known as: NITROSTAT   Place 0.4 mg under the tongue every 5 (five) minutes as needed. For chest pain      pantoprazole 40 MG tablet   Commonly known as: PROTONIX   Take 1 tablet (40 mg total) by mouth 2 (two) times daily before a meal.      pregabalin 25 MG capsule   Commonly known as: LYRICA   Take 25 mg by mouth 2 (two) times daily.      simvastatin 40 MG tablet   Commonly known as: ZOCOR   Take 40 mg by mouth at bedtime.      traMADol 50 MG tablet   Commonly known as: ULTRAM   Take 50 mg by mouth every 6 (six) hours as needed. For pain           Follow-up Information    Follow up with Cala Bradford, MD. Schedule an appointment as soon as possible for a visit in 1 week.      Follow up with Sharronda Schweers, MD. Schedule an appointment as soon as possible for a visit in 3 days.   Contact information:   [facility] [address], [address] [place] [address] [phone]          Signed: Christia Reading 01/09/2012, 9:00 AM

## 2012-01-09 NOTE — Progress Notes (Signed)
Clinical Social Work Department CLINICAL SOCIAL WORK PLACEMENT NOTE 01/09/2012  Patient:  [name], [name]  Account Number:  [id number] Admit date:  01/02/2012  Clinical Social Worker:  Genelle Bal, LCSW  Date/time:  01/09/2012 06:46 PM  Clinical Social Work is seeking post-discharge placement for this patient at the following level of care:   SKILLED NURSING   (*CSW will update this form in Epic as items are completed)   01/09/2012  Patient/family provided with [facility] Department of Clinical Social Work's list of facilities offering this level of care within the geographic area requested by the patient (or if unable, by the patient's family).  01/09/2012  Patient/family informed of their freedom to choose among providers that offer the needed level of care, that participate in Medicare, Medicaid or managed care program needed by the patient, have an available bed and are willing to accept the patient.    Patient/family informed of [facility]' ownership interest in [facility], as well as of the fact that they are under no obligation to receive care at this facility.  PASARR submitted to [employer] on 01/09/2012 PASARR number received from [employer] on   FL2 transmitted to all facilities in geographic area requested by pt/family on  01/09/2012 FL2 transmitted to all facilities within larger geographic area on   Patient informed that his/her managed care company has contracts with or will negotiate with  certain facilities, including the following:     Patient/family informed of bed offers received:   Patient chooses bed at  Physician recommends and patient chooses bed at    Patient to be transferred to  on   Patient to be transferred to facility by   The following physician request were entered in Epic:   Additional Comments:

## 2012-01-09 NOTE — Progress Notes (Signed)
Clinical Social Work Department BRIEF PSYCHOSOCIAL ASSESSMENT 01/09/2012  Patient:  [name], [name]     Account Number:  [id number]     Admit date:  01/02/2012  Clinical Social Worker:  Delmer Islam  Date/Time:  01/09/2012 06:28 PM  Referred by:  Care Management  Date Referred:  01/09/2012 Referred for  SNF Placement   Other Referral:   Interview type:  Patient Other interview type:   CSW also talked with patient's daughter - [name] Brouillard    PSYCHOSOCIAL DATA Living Status:  WITH ADULT CHILDREN Admitted from facility:   Level of care:   Primary support name:  [name] Chaisson Primary support relationship to patient:  CHILD, ADULT Degree of support available:   Strong support. Patient moved from [place] to live with her middle daughter Elonda Husky.    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 Ms. Schley was a bit upset and very concerned about being discharged before she was totally comfortable with her trach care. She felt that people were coming in talking with her, but not offering viable solutions to help her and this concerned her greatly. Patient and CSW talked about this at lenght and the health issues that has brought her to this hosp. and having to have a trach. We also talked about the alternative of going to a SNF for short-term rehab and patient is open to this idea, as this would give her more time to get used to taking care of her trach before going home and being alone all day (daughter works). When daughter arrived she also was understandably upset as she had  not been able to talk with the doctor and was also concerned about being trained in bits and peices regarding her mother's trach care. Anibal Henderson with [facility] also came in right after daughter arrived and conversation continued regarding the patient's trach care. Mr. Orson Aloe supported the idea of ST rehab for strenghtening, continued medical care, and more time to learn and become comfortable with trach care for patient and her daughter. Both were agreeable to this idea and they were provided with a skilled facility list and explained the bed search process. CSW advised that bed offers will be given on Tuesday, 3/19. CSW offered patient and daughter support and suggested that daughter talk with floor leadership (charge RN, asst Company secretary) regarding her concerns. CSW shared daughter's wish to talk with someone to RN.   Assessment/plan status:  Psychosocial Support/Ongoing Assessment of Needs Other assessment/ plan:   Information/referral to community resources:   Skilled nursing facility list given to patient/daughter.    PATIENT'S/FAMILY'S RESPONSE TO PLAN OF CARE: Patient and daughter appreciated being able to share their conerns and frustrations and are in agreement with short-term rehab for patient.

## 2012-01-09 NOTE — Progress Notes (Signed)
Trach care teaching given to daughter, daughter able to do demonstrate trach care. Daughter verbalized understanding and very appreciative with the teaching done.

## 2012-01-09 NOTE — Progress Notes (Signed)
Respiratory Therapy Note- Trach education performed with patient. Patient was very anxious prior to working with her this morning due to possible discharge. Inner canula instruction of how to remove, clean and place back was walked through with patient. Reassured patient that she will have all the correct equipment and supplies prior to going home. Confirmed that Daughter will be coming in the AM for education 01/10/2012 at 0500. More education will be performed through out the day and evening allowing full involvement on patients part.

## 2012-01-10 NOTE — Progress Notes (Signed)
Clinical Social Work Department CLINICAL SOCIAL WORK PLACEMENT NOTE 01/10/2012  Patient:  [name], [name]  Account Number:  [id number] Admit date:  01/02/2012  Clinical Social Worker:  Genelle Bal, LCSW  Date/time:  01/09/2012 06:46 PM  Clinical Social Work is seeking post-discharge placement for this patient at the following level of care:   SKILLED NURSING   (*CSW will update this form in Epic as items are completed)   01/09/2012  Patient/family provided with [facility] Department of Clinical Social Work's list of facilities offering this level of care within the geographic area requested by the patient (or if unable, by the patient's family).  01/09/2012  Patient/family informed of their freedom to choose among providers that offer the needed level of care, that participate in Medicare, Medicaid or managed care program needed by the patient, have an available bed and are willing to accept the patient.    Patient/family informed of [facility]' ownership interest in [facility], as well as of the fact that they are under no obligation to receive care at this facility.  PASARR submitted to EDS on 01/09/2012 PASARR number received from EDS on 01/09/2012  FL2 transmitted to all facilities in geographic area requested by pt/family on  01/09/2012 FL2 transmitted to all facilities within larger geographic area on   Patient informed that his/her managed care company has contracts with or will negotiate with  certain facilities, including the following:     Patient/family informed of bed offers received:  01/09/2012 Patient chooses bed at [facility] Physician recommends and patient chooses bed at    Patient to be transferred to [facility] on  01/10/2012 Patient to be transferred to facility by ambulance  The following physician request were entered in Epic:   Additional Comments: DICHARGE NOTE

## 2012-01-10 NOTE — Progress Notes (Signed)
Results for SECRET, KRISTENSEN (MRN [id number]) as of 01/10/2012 10:23  Ref. Range 01/07/2012 07:45  Glucose-Capillary Latest Range: 70-99 mg/dL 53 (L)    Results for DASHANAE, LONGFIELD (MRN [id number]) as of 01/10/2012 10:23  Ref. Range 01/09/2012 08:01  Glucose-Capillary Latest Range: 70-99 mg/dL 45 (L)    Hypoglycemic in the morning X 2 days.  May want to reduce Lantus dose by 10%.  Patient still requiring Novolog SSI and Novolog meal coverage.  A1C 12.5% (01/06/12).  Since she is doing fairly well on this current hospital regimen, recommend we send her out on Lantus, SSI, and meal coverage.  1. Please decrease Lantus to 22 units QHS. 2. Please D/C patient to SNF on Lantus, SSI, and scheduled Novolog meal coverage.  Will follow. Ambrose Finland RN, MSN, CDE Diabetes Coordinator Inpatient Diabetes Program [phone]

## 2012-01-10 NOTE — Progress Notes (Addendum)
DC to [facility] nursing facility by ambulance. Daughter at bedside.

## 2012-01-10 NOTE — Progress Notes (Signed)
Spoke with patient, her daughter, and sw at bedside regarding transfer to snf for short term stabilization.  All parties are will to consider placement.  Writer will discuss bed offers with daughter, but have made her aware that SNF selection is ultimately her and the patient's choice.  Will cont to monitor.For any additional questions or new referrals please contact Anibal Henderson BSN RN [facility] Liaison at [phone].

## 2012-01-10 NOTE — Progress Notes (Signed)
8 Days Post-Op  Subjective: Doing well. No new complaints.  Speaking well with valve.  Breathing comfortably.  Tolerating diet.  Objective: Vital signs in last 24 hours: Temp:  [97.8 F (36.6 C)-98.5 F (36.9 C)] 98.5 F (36.9 C) (03/19 0610) Pulse Rate:  [61-70] 70  (03/19 0610) Resp:  [16-20] 19  (03/19 0610) BP: (117-179)/(47-78) 160/64 mmHg (03/19 0610) SpO2:  [98 %-100 %] 100 % (03/19 0746) FiO2 (%):  [25 %-28 %] 28 % (03/19 0746) Last BM Date: 01/09/12  Intake/Output from previous day: 03/18 0701 - 03/19 0700 In: 600 [P.O.:600] Out: 500 [Urine:500] Intake/Output this shift:    General appearance: alert, cooperative and no distress Neck: trach tube in place, site stable.  Good voice with P-M valve.  Lab Results:   Basename 01/07/12 1245  WBC 8.2  HGB 9.1*  HCT 28.5*  PLT 172   BMET  Basename 01/07/12 1245  NA 138  K 4.1  CL 101  CO2 28  GLUCOSE 179*  BUN 14  CREATININE 1.25*  CALCIUM 8.4   PT/INR No results found for this basename: LABPROT:2,INR:2 in the last 72 hours ABG No results found for this basename: PHART:2,PCO2:2,PO2:2,HCO3:2 in the last 72 hours  Studies/Results: No results found.  Anti-infectives: Anti-infectives     Start     Dose/Rate Route Frequency Ordered Stop   01/09/12 0000   levofloxacin (LEVAQUIN) 250 MG tablet        250 mg Oral Every 24 hours 01/09/12 0859 01/19/12 2359   01/07/12 1400   levofloxacin (LEVAQUIN) tablet 500 mg  Status:  Discontinued        500 mg Oral Daily 01/07/12 1132 01/07/12 1214   01/07/12 1300   levofloxacin (LEVAQUIN) tablet 250 mg        250 mg Oral Every 24 hours 01/07/12 1215 01/12/12 1259   01/06/12 1630   moxifloxacin (AVELOX) IVPB 400 mg  Status:  Discontinued        400 mg 250 mL/hr over 60 Minutes Intravenous Every 24 hours 01/06/12 1533 01/07/12 1132          Assessment/Plan: s/p Procedure(s) (LRB): TRACHEOSTOMY (N/A) DIRECT LARYNGOSCOPY (N/A) Discharge plan changed yesterday to  discharge to a SNF.  Care management working on transition.  Medically stable for transfer whenever bed available.  LOS: 8 days    [name] 01/10/2012

## 2012-01-10 NOTE — Progress Notes (Signed)
RT was called by unit secretary saying that the patients trach was out. Upon arriving to bedside patient's trach was on bed with RN in room talking to the family. A new trach was inserted by RT, the same size as the previous one, #6 cuffless shiley. ETCo2 was confirmed via ETCo2 monitor, BBS-equal and clear with good chest rise noted. Spo2 maintained at 98-100% throughout whole procedure with a steady HR of 75. Patient tolerated procedure well with no adverse effects. Her stoma appeared stable and within normal limits. Patients daughter was at bedside as well so she was also told what was going on and what to do if she ever had this scenario happen at home. She stated that patient who is her mother, may be coming home with her with trach. She understood as well as the patient who is very alert and oriented. Patient is currently in no apparent distress and RT will cont to monitor.

## 2012-01-10 NOTE — Progress Notes (Addendum)
Pt coughed trach out.Marland Kitchenoxygen sats remained in upper 90's. Resp therapy reinserted without difficulty.No resp distress noted  during this event

## 2012-01-11 ENCOUNTER — Encounter (HOSPITAL_COMMUNITY): Payer: Self-pay | Admitting: Physical Medicine and Rehabilitation

## 2012-01-11 ENCOUNTER — Emergency Department (HOSPITAL_COMMUNITY)
Admission: EM | Admit: 2012-01-11 | Discharge: 2012-01-11 | Disposition: A | Discharge status: Skilled Nursing Facility | Payer: No Typology Code available for payment source | Attending: Emergency Medicine | Admitting: Emergency Medicine

## 2012-01-11 DIAGNOSIS — Z7982 Long term (current) use of aspirin: Secondary | ICD-10-CM | POA: Insufficient documentation

## 2012-01-11 DIAGNOSIS — I252 Old myocardial infarction: Secondary | ICD-10-CM | POA: Insufficient documentation

## 2012-01-11 DIAGNOSIS — E039 Hypothyroidism, unspecified: Secondary | ICD-10-CM | POA: Insufficient documentation

## 2012-01-11 DIAGNOSIS — E119 Type 2 diabetes mellitus without complications: Secondary | ICD-10-CM | POA: Insufficient documentation

## 2012-01-11 DIAGNOSIS — I739 Peripheral vascular disease, unspecified: Secondary | ICD-10-CM | POA: Insufficient documentation

## 2012-01-11 DIAGNOSIS — Z8673 Personal history of transient ischemic attack (TIA), and cerebral infarction without residual deficits: Secondary | ICD-10-CM | POA: Insufficient documentation

## 2012-01-11 DIAGNOSIS — I2510 Atherosclerotic heart disease of native coronary artery without angina pectoris: Secondary | ICD-10-CM | POA: Insufficient documentation

## 2012-01-11 DIAGNOSIS — J9509 Other tracheostomy complication: Secondary | ICD-10-CM | POA: Insufficient documentation

## 2012-01-11 DIAGNOSIS — Y838 Other surgical procedures as the cause of abnormal reaction of the patient, or of later complication, without mention of misadventure at the time of the procedure: Secondary | ICD-10-CM | POA: Insufficient documentation

## 2012-01-11 DIAGNOSIS — Z79899 Other long term (current) drug therapy: Secondary | ICD-10-CM | POA: Insufficient documentation

## 2012-01-11 DIAGNOSIS — Z794 Long term (current) use of insulin: Secondary | ICD-10-CM | POA: Insufficient documentation

## 2012-01-11 DIAGNOSIS — I509 Heart failure, unspecified: Secondary | ICD-10-CM | POA: Insufficient documentation

## 2012-01-11 MED ORDER — SODIUM CHLORIDE 0.9 % IV BOLUS (SEPSIS)
1000.0000 mL | Freq: Once | INTRAVENOUS | Status: AC
  Administered 2012-01-11: 1000 mL via INTRAVENOUS

## 2012-01-11 MED ORDER — LORAZEPAM 2 MG/ML IJ SOLN
1.0000 mg | Freq: Once | INTRAMUSCULAR | Status: AC
  Administered 2012-01-11: 1 mg via INTRAVENOUS
  Filled 2012-01-11: qty 1

## 2012-01-11 NOTE — ED Notes (Addendum)
Pt presents to department via [facility] from [facility] for problems with tracheostomy. Pt states she was taking a shower this morning when she noticed device was loose and inner cannula was not present in stoma. Upon arrival unlabored respirations. Denies SOB. She is alert and oriented x4. Denies pain. Recently had trach placed for about 1 week.

## 2012-01-11 NOTE — ED Notes (Addendum)
RT at the bedside. Cuffed Shiley 5.45mm placed without difficulty. Pt suctioned. EDP aware and to consult ENT. Respirations unlabored at the time, pt coughing but able to maintain airway. Vital signs stable. Will continue to monitor. Tracheostomy care performed, skin around stoma red, but intact.

## 2012-01-11 NOTE — ED Notes (Signed)
Pt assisted to sit in chair

## 2012-01-11 NOTE — ED Provider Notes (Signed)
History     CSN: [id number]  Arrival date & time 01/11/12  1000   First MD Initiated Contact with Patient 01/11/12 1013      Chief Complaint  Patient presents with  . Tracheostomy Tube Change    Trach Dislodged    HPI This elderly female with multiple medical problems now presents with tracheostomy problem.  Her tracheostomy was placed one week ago due to COPD, vocal cord edema.  This morning, just prior to arrival, the patient had her tracheostomy device fall out.  She denies any other complaints. Past Medical History  Diagnosis Date  . Diabetes mellitus     on Lantus 30 U   . MI (myocardial infarction)   . CAD (coronary artery disease)     S/p CABG and stenting  . S/P CABG (coronary artery bypass graft)   . PAD (peripheral artery disease)   . Stroke     MRI 11/2011 with remote occipital lobe. MRA with moderate left focal vertebral artery stenosis  . CHF (congestive heart failure) 11/2011    Echo with EF 30-35%, global hypokinesis, and inferior akinesis  . Pneumonia   . Hypothyroidism     Past Surgical History  Procedure Date  . Ptca   . Thyroidectomy   . Coronary artery bypass graft     2 vessel  . Carotid endarterectomy ~2008    Left   . Cholecystectomy, laparoscopic less than 6 months ago  . Cholecystectomy   . Tracheostomy tube placement 01/02/2012    Procedure: TRACHEOSTOMY;  Surgeon: Christia Reading, MD;  Location: [facility] OR;  Service: ENT;  Laterality: N/A;  . Direct laryngoscopy 01/02/2012    Procedure: DIRECT LARYNGOSCOPY;  Surgeon: Christia Reading, MD;  Location: [facility] OR;  Service: ENT;  Laterality: N/A;  with Biopsy    No family history on file.  History  Substance Use Topics  . Smoking status: Former Smoker -- 1.0 packs/day for 50 years    Types: Cigarettes    Quit date: 01/24/2011  . Smokeless tobacco: Never Used  . Alcohol Use: No    OB History    Grav Para Term Preterm Abortions TAB SAB Ect Mult Living                  Review of Systems  All other  systems reviewed and are negative.    Allergies  Crestor; Lisinopril; and Vicodin  Home Medications   Current Outpatient Rx  Name Route Sig Dispense Refill  . ACETAMINOPHEN 500 MG PO TABS Oral Take 500 mg by mouth every 6 (six) hours as needed. For pain    . ALBUTEROL SULFATE (2.5 MG/3ML) 0.083% IN NEBU Nebulization Take 2.5 mg by nebulization 4 (four) times daily.    . ASPIRIN EC 81 MG PO TBEC Oral Take 81 mg by mouth daily.    Marland Kitchen CARVEDILOL 25 MG PO TABS Oral Take 25 mg by mouth 2 (two) times daily with a meal.    . CLOPIDOGREL BISULFATE 75 MG PO TABS Oral Take 75 mg by mouth daily.    Marland Kitchen FAMOTIDINE 20 MG PO TABS Oral Take 20 mg by mouth at bedtime.    . INSULIN ASPART 100 UNIT/ML [place] SOLN Subcutaneous Inject 7 Units into the skin 3 (three) times daily before meals.    . INSULIN GLARGINE 100 UNIT/ML [place] SOLN Subcutaneous Inject 22 Units into the skin at bedtime.    . IPRATROPIUM BROMIDE 0.02 % IN SOLN Nebulization Take 0.5 mg by nebulization 4 (four)  times daily.    . ISOSORBIDE MONONITRATE ER 30 MG PO TB24 Oral Take 30 mg by mouth daily.    Marland Kitchen LEVOFLOXACIN 250 MG PO TABS Oral Take 250 mg by mouth daily. For ten days starting 01/09/12    . LOPERAMIDE HCL 2 MG PO CAPS Oral Take 4 mg by mouth 4 (four) times daily as needed. For diarrhea    . LOSARTAN POTASSIUM 100 MG PO TABS Oral Take 100 mg by mouth daily. Hold if SBP < 110: Hold if HR < 60    . MOMETASONE FUROATE 50 MCG/ACT NA SUSP Nasal Place 2 sprays into the nose at bedtime.    Marland Kitchen NITROGLYCERIN 0.4 MG SL SUBL Sublingual Place 0.4 mg under the tongue every 5 (five) minutes as needed. For chest pain    . PANTOPRAZOLE SODIUM 40 MG PO TBEC Oral Take 40 mg by mouth 2 (two) times daily before a meal.    . PREGABALIN 25 MG PO CAPS Oral Take 25 mg by mouth 2 (two) times daily.    Marland Kitchen SIMVASTATIN 40 MG PO TABS Oral Take 40 mg by mouth at bedtime.    . TRAMADOL HCL 50 MG PO TABS Oral Take 50 mg by mouth every 6 (six) hours as needed. For pain       BP 163/72  Pulse 63  Temp(Src) 98.5 F (36.9 C) (Oral)  Resp 18  SpO2 98%  Physical Exam  Nursing note and vitals reviewed. Constitutional: She is oriented to person, place, and time. She appears well-developed and well-nourished. No distress.  HENT:  Head: Normocephalic and atraumatic.  Eyes: Conjunctivae and EOM are normal.  Neck:    Cardiovascular: Normal rate and regular rhythm.   Pulmonary/Chest: Effort normal and breath sounds normal.  Neurological: She is alert and oriented to person, place, and time. No cranial nerve deficit. She exhibits normal muscle tone. Coordination normal.  Skin: Skin is warm.  Psychiatric: She has a normal mood and affect.    ED Course  Procedures (including critical care time)  Labs Reviewed - No data to display No results found.   No diagnosis found.    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  Immediately after the patient's presentation and a smaller device was placed to ensure patency.  An appropriately sized device was ordered and placed, and the patient was discharged in stable condition to follow up with her ENT doctor.     Gerhard Munch, MD 01/11/12 [phone]

## 2012-01-11 NOTE — ED Notes (Signed)
Pt arrived in the ed with her #6 cuffless shiley trach out replaced with a #4 cuffed traach no uncuffed available er md calling pts ent to see what trach he wants back in

## 2012-01-11 NOTE — ED Notes (Signed)
Pt presents to department for evaluation of dislodged tracheostomy. Pt states inner cannula came out this morning while taking shower. States she recently had placed on 3/19. Denies SOB. Respirations unlabored. Lung sounds clear and equal bilaterally. She is alert and oriented x4. No signs of distress noted at the time.

## 2012-01-11 NOTE — ED Notes (Signed)
PTAR notified for pt transport to [facility] with O2.

## 2012-01-11 NOTE — Discharge Instructions (Signed)
Please be sure to speak with Dr. Jenne Pane tomorrow.  Return to the ED for any / all concerning changes in your condition.

## 2012-01-11 NOTE — ED Notes (Signed)
Transport called regarding pt returning to facility

## 2012-01-11 NOTE — Procedures (Signed)
Tracheostomy Change Note  Patient Details:   Name: [name] DOB: November 20, 1945 MRN: [id number]    Airway Documentation:     Evaluation  O2 sats: stable throughout Complications: No apparent complications Patient did tolerate procedure well. Bilateral Breath Sounds: Clear    Carrington Clamp 01/11/2012, 10:55 AM

## 2012-01-11 NOTE — ED Notes (Signed)
MD at bedside. 

## 2012-01-11 NOTE — ED Notes (Signed)
Pt is resting at this time

## 2012-01-11 NOTE — Progress Notes (Signed)
Late entry:     CARE MANAGEMENT NOTE 01/11/2012  Patient:  [name], [name]   Account Number:  [id number]  Date Initiated:  01/09/2012  Documentation initiated by:  Breiona Couvillon  Subjective/Objective Assessment:   Referral for HH, suction and trach supplies.     Action/Plan:   Met with pt who states that she will d/c to home of daughters friend who will be with her during the day, she is getting the address for delivery of DME.  01/10/2012 Plan now for ST SNF.   Anticipated DC Date:  01/10/2012   Anticipated DC Plan:  SKILLED NURSING FACILITY         PAC Choice  DURABLE MEDICAL EQUIPMENT  HOME HEALTH   Choice offered to / List presented to:  C-1 Patient   DME arranged  SUCTION  TRACH SUPPLIES      DME agency  [facility]     [facility] arranged  HH-1 RN      [facility] agency  [facility]   Status of service:  Completed, signed off Medicare Important Message given?   (If response is "NO", the following Medicare IM given date fields will be blank) Date Medicare IM given:   Date Additional Medicare IM given:    Discharge Disposition:  SKILLED NURSING FACILITY  Per UR Regulation:    If discussed at Long Length of Stay Meetings, dates discussed:    Comments:  01/10/2012 Pt plan now is to d/c to short term SNF, CSW , [place] working with pt and has offers. Hopefully will be ready for d/c on 01/11/2012. Johny Shock RN MPH Case manager [phone]

## 2012-01-25 ENCOUNTER — Encounter: Payer: Self-pay | Admitting: Vascular Surgery

## 2012-01-31 ENCOUNTER — Encounter: Payer: Self-pay | Admitting: Vascular Surgery

## 2012-02-02 ENCOUNTER — Ambulatory Visit (INDEPENDENT_AMBULATORY_CARE_PROVIDER_SITE_OTHER): Payer: No Typology Code available for payment source | Admitting: Vascular Surgery

## 2012-02-02 ENCOUNTER — Encounter: Payer: Self-pay | Admitting: Vascular Surgery

## 2012-02-02 ENCOUNTER — Ambulatory Visit (INDEPENDENT_AMBULATORY_CARE_PROVIDER_SITE_OTHER): Payer: No Typology Code available for payment source | Admitting: *Deleted

## 2012-02-02 VITALS — BP 175/68 | HR 70 | Temp 97.7°F | Resp 16 | Ht 61.0 in | Wt 155.0 lb

## 2012-02-02 DIAGNOSIS — Z01810 Encounter for preprocedural cardiovascular examination: Secondary | ICD-10-CM

## 2012-02-02 DIAGNOSIS — I6529 Occlusion and stenosis of unspecified carotid artery: Secondary | ICD-10-CM

## 2012-02-02 DIAGNOSIS — I70219 Atherosclerosis of native arteries of extremities with intermittent claudication, unspecified extremity: Secondary | ICD-10-CM

## 2012-02-02 NOTE — Progress Notes (Signed)
History of Present Illness:  Patient is a 66 y.o. year old female who presents for evaluation of carotid stenosis.  The patient denies symptoms of TIA, amaurosis, or stroke.  She apparently did have a stroke in February the right posterior parietal lobe of her brain. At that time her symptoms were primarily difficulty with balance and some visual problems. These of all completely resolved. The patient recently moved here from [place]. She apparently had a left carotid endarterectomy done for asymptomatic carotid stenosis and 2006. Patient also has a tracheostomy and is being followed by Dr. Jenne Pane for this. 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 She is on oxygen at home at night time. The patient is currently on Plavix and aspirin antiplatelet therapy.  The carotid stenosis was found on recent workup for her stroke.  Other medical problems include diabetes, coronary artery disease, congestive heart failure, renal insufficiency, and COPD.  These are currently stable and followed by her primary care physician Laurann Montana.  Past Medical History  Diagnosis Date  . Diabetes mellitus     on Lantus 30 U   . CAD (coronary artery disease)     S/p CABG and stenting  . S/P CABG (coronary artery bypass graft)   . PAD (peripheral artery disease)   . Stroke     MRI 11/2011 with remote occipital lobe. MRA with moderate left focal vertebral artery stenosis  . CHF (congestive heart failure) 11/2011    Echo with EF 30-35%, global hypokinesis, and inferior akinesis  . Pneumonia   . Hypothyroidism   . Hyperlipidemia   . DDD (degenerative disc disease), lumbar   . CVA (cerebral vascular accident) 11/2010  . MI (myocardial infarction) 1997  . COPD (chronic obstructive pulmonary disease)   . Chronic heart failure   . Thyroid disease   . Anemia   . Chronic kidney disease   . Heart disease     Past Surgical History  Procedure Date  . Ptca   . Thyroidectomy   . Coronary artery bypass graft     2  vessel  . Carotid endarterectomy ~2008    Left   . Cholecystectomy, laparoscopic less than 6 months ago  . Cholecystectomy   . Tracheostomy tube placement 01/02/2012    Procedure: TRACHEOSTOMY;  Surgeon: Christia Reading, MD;  Location: [facility] OR;  Service: ENT;  Laterality: N/A;  . Direct laryngoscopy 01/02/2012    Procedure: DIRECT LARYNGOSCOPY;  Surgeon: Christia Reading, MD;  Location: [facility] OR;  Service: ENT;  Laterality: N/A;  with Biopsy  . Angioplasty      Social History History  Substance Use Topics  . Smoking status: Former Smoker -- 1.0 packs/day for 50 years    Types: Cigarettes    Quit date: 01/24/2011  . Smokeless tobacco: Never Used  . Alcohol Use: No    Family History Family History  Problem Relation Age of Onset  . Hyperlipidemia Mother     Allergies  Allergies  Allergen Reactions  . Crestor (Rosuvastatin Calcium) Other (See Comments)    Muscle Pain  . Lisinopril Other (See Comments)    "Lowers her BP too low"  . Vicodin (Hydrocodone-Acetaminophen) Nausea And Vomiting     Current Outpatient Prescriptions  Medication Sig Dispense Refill  . acetaminophen (TYLENOL) 500 MG tablet Take 500 mg by mouth every 6 (six) hours as needed. For pain      . albuterol (PROVENTIL) (2.5 MG/3ML) 0.083% nebulizer solution Take 2.5 mg by nebulization 4 (four) times daily.      Marland Kitchen  aspirin EC 81 MG tablet Take 81 mg by mouth daily.      . carvedilol (COREG) 25 MG tablet Take 25 mg by mouth 2 (two) times daily with a meal.      . clopidogrel (PLAVIX) 75 MG tablet Take 75 mg by mouth daily.      . famotidine (PEPCID) 20 MG tablet Take 20 mg by mouth at bedtime.      . gabapentin (NEURONTIN) 300 MG capsule Take 300 mg by mouth 2 (two) times daily.      . insulin glargine (LANTUS) 100 UNIT/ML injection Inject 22 Units into the skin at bedtime.      Marland Kitchen ipratropium (ATROVENT) 0.02 % nebulizer solution Take 0.5 mg by nebulization 4 (four) times daily.      . isosorbide mononitrate (IMDUR) 30 MG  24 hr tablet Take 30 mg by mouth daily.      Marland Kitchen loperamide (IMODIUM) 2 MG capsule Take 4 mg by mouth 4 (four) times daily as needed. For diarrhea      . losartan (COZAAR) 100 MG tablet Take 100 mg by mouth daily. Hold if SBP < 110: Hold if HR < 60      . nitroGLYCERIN (NITROSTAT) 0.4 MG SL tablet Place 0.4 mg under the tongue every 5 (five) minutes as needed. For chest pain      . pregabalin (LYRICA) 25 MG capsule Take 25 mg by mouth 2 (two) times daily.      . simvastatin (ZOCOR) 40 MG tablet Take 40 mg by mouth at bedtime.      . traMADol (ULTRAM) 50 MG tablet Take 50 mg by mouth every 6 (six) hours as needed. For pain      . insulin aspart (NOVOLOG) 100 UNIT/ML injection Inject 7 Units into the skin 3 (three) times daily before meals.      . mometasone (NASONEX) 50 MCG/ACT nasal spray Place 2 sprays into the nose at bedtime.      . pantoprazole (PROTONIX) 40 MG tablet Take 40 mg by mouth 2 (two) times daily before a meal.      . DISCONTD: dexlansoprazole (DEXILANT) 60 MG capsule Take 60 mg by mouth daily.      Marland Kitchen DISCONTD: furosemide (LASIX) 20 MG tablet Take 3 tablets (60 mg total) by mouth daily.  30 tablet  5    ROS:   General:  No weight loss, Fever, chills  HEENT: No recent headaches, no nasal bleeding, no visual changes, no sore throat  Neurologic: No dizziness, blackouts, seizures. No recent symptoms of stroke or mini- stroke. No recent episodes of slurred speech, or temporary blindness.  Cardiac: No recent episodes of chest pain/pressure, no shortness of breath at rest.  No shortness of breath with exertion.  Denies history of atrial fibrillation or irregular heartbeat  Vascular: No history of rest pain in feet.  No history of claudication.  No history of non-healing ulcer, No history of DVT   Pulmonary: No hemoptysis,  No asthma or wheezing  Musculoskeletal:  [x ] Arthritis, [ ]  Low back pain,  [ ]  Joint pain  Hematologic:No history of hypercoagulable state.  No history of  easy bleeding.  No history of anemia  Gastrointestinal: No hematochezia or melena,  No gastroesophageal reflux, + trouble swallowing  Urinary: [x ] chronic Kidney disease, [ ]  on HD - [ ]  MWF or [ ]  TTHS, [ ]  Burning with urination, [ ]  Frequent urination, [ ]  Difficulty urinating;   Skin: No rashes  Psychological: No history of anxiety,  No history of depression   Physical Examination  Filed Vitals:   02/02/12 1420  BP: 175/68  Pulse: 70  Temp: 97.7 F (36.5 C)  TempSrc: Oral  Resp: 16  Height: 5\' 1"  (1.549 m)  Weight: 155 lb (70.308 kg)    Body mass index is 29.29 kg/(m^2).  General:  Alert and oriented, no acute distress HEENT: Normal Neck: No bruit or JVD, well-healed left neck scar, tracheostomy capped Pulmonary: Clear to auscultation bilaterally Cardiac: Regular Rate and Rhythm without murmur Gastrointestinal: Soft, non-tender, non-distended, no mass, no scars Skin: No rash, toes dusky bilaterally, no ulcer Extremity Pulses:  2+ radial, brachial, femoral, 1+ dorsalis pedis bilaterally Musculoskeletal: No deformity or edema  Neurologic: Upper and lower extremity motor 5/5 and symmetric  DATA: She had bilateral carotid duplex exam today which I reviewed and interpreted. This showed 40-60% bilateral internal carotid artery stenosis she had antegrade vertebral flow on the left an antegrade vertebral flow in the right with abnormal waveforms.  The study was suboptimal due to high carotid bifurcation   ASSESSMENT: Bilateral moderate carotid stenosis was suboptimal duplex findings   PLAN:  Believe the best option would be to obtain a CT Angio of her neck to further define both carotid bifurcations. She'll return for followup after her CT Angio she'll continue her antiplatelet therapy   Fabienne Bruns, MD [facility] Office: [phone] Pager: [phone]

## 2012-02-03 NOTE — Progress Notes (Signed)
Addended by: Sharee Pimple on: 02/03/2012 11:46 AM   Modules accepted: Orders

## 2012-02-10 NOTE — Procedures (Unsigned)
CAROTID DUPLEX EXAM  INDICATION:  Followup carotid disease from outside facility.  HISTORY: Diabetes:  Yes Cardiac:  Yes Hypertension:  Yes Smoking:  Previous Previous Surgery:  Left carotid surgery approximately 2006, per patient CV History:  CVA 10/2011 Amaurosis Fugax No, Paresthesias No, Hemiparesis No                                      RIGHT             LEFT Brachial systolic pressure:         162               162 Brachial Doppler waveforms:         WNL               WNL Vertebral direction of flow:        Abnormal antegrade                  Antegrade DUPLEX VELOCITIES (cm/sec) CCA peak systolic                   45                84 ECA peak systolic                   NV                47 ICA peak systolic                   157               184 ICA end diastolic                   40                56 PLAQUE MORPHOLOGY: PLAQUE AMOUNT: PLAQUE LOCATION:  IMPRESSION: 1. Technically difficult study. 2. Velocity suggests 40 to 59% bilateral ICA stenosis; however, plaque     could not be characterized. 3. Vessels were evaluated mainly by color Doppler as the 2D image was     inadequate. 4. Hypervascularization is observed in the left distal ICA area.     There is a high bifurcation, question collateralization versus     unknown etiology.  ___________________________________________ Janetta Hora Fields, MD  LT/MEDQ  D:  02/02/2012  T:  02/02/2012  Job:  [id number]

## 2012-02-13 ENCOUNTER — Other Ambulatory Visit: Payer: Self-pay | Admitting: Family Medicine

## 2012-02-18 ENCOUNTER — Other Ambulatory Visit: Payer: Self-pay | Admitting: Vascular Surgery

## 2012-02-18 LAB — CREATININE, SERUM: Creat: 0.89 mg/dL (ref 0.50–1.10)

## 2012-02-18 LAB — BUN: BUN: 20 mg/dL (ref 6–23)

## 2012-02-22 ENCOUNTER — Encounter: Payer: Self-pay | Admitting: Vascular Surgery

## 2012-02-23 ENCOUNTER — Encounter: Payer: Self-pay | Admitting: Vascular Surgery

## 2012-02-23 ENCOUNTER — Ambulatory Visit (INDEPENDENT_AMBULATORY_CARE_PROVIDER_SITE_OTHER): Payer: No Typology Code available for payment source | Admitting: *Deleted

## 2012-02-23 ENCOUNTER — Ambulatory Visit (INDEPENDENT_AMBULATORY_CARE_PROVIDER_SITE_OTHER): Payer: No Typology Code available for payment source | Admitting: Vascular Surgery

## 2012-02-23 ENCOUNTER — Ambulatory Visit
Admission: RE | Admit: 2012-02-23 | Discharge: 2012-02-23 | Disposition: A | Discharge status: Home / Self Care | Payer: No Typology Code available for payment source | Source: Ambulatory Visit | Attending: Vascular Surgery | Admitting: Vascular Surgery

## 2012-02-23 VITALS — BP 187/76 | HR 81 | Temp 97.8°F | Ht 61.0 in | Wt 150.0 lb

## 2012-02-23 DIAGNOSIS — Z01810 Encounter for preprocedural cardiovascular examination: Secondary | ICD-10-CM

## 2012-02-23 DIAGNOSIS — I6529 Occlusion and stenosis of unspecified carotid artery: Secondary | ICD-10-CM

## 2012-02-23 DIAGNOSIS — I739 Peripheral vascular disease, unspecified: Secondary | ICD-10-CM

## 2012-02-23 DIAGNOSIS — I70219 Atherosclerosis of native arteries of extremities with intermittent claudication, unspecified extremity: Secondary | ICD-10-CM

## 2012-02-23 MED ORDER — IOHEXOL 350 MG/ML SOLN
100.0000 mL | Freq: Once | INTRAVENOUS | Status: AC | PRN
  Administered 2012-02-23: 100 mL via INTRAVENOUS

## 2012-02-23 NOTE — Progress Notes (Signed)
History of Present Illness: Patient is a 66 y.o. year old female who presents for evaluation of carotid stenosis. The patient denies symptoms of TIA, amaurosis, or stroke. She apparently did have a stroke in February the right posterior parietal lobe of her brain. At that time her symptoms were primarily difficulty with balance and some visual problems. These of all completely resolved. The patient recently moved here from [place]. She apparently had a left carotid endarterectomy done for asymptomatic carotid stenosis and 2006. Patient also has a tracheostomy and is being followed by Dr. Jenne Pane for this. 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 She is on oxygen at home at night time. The patient is currently on Plavix and aspirin antiplatelet therapy. The carotid stenosis was found on recent workup for her stroke. Other medical problems include diabetes, coronary artery disease, congestive heart failure, renal insufficiency, and COPD. These are currently stable and followed by her primary care physician Laurann Montana.  She is also here today because of peripheral arterial disease. She has had intermittent symptoms of claudication in both lower extremities left greater than right. She had ABIs performed in our office today.  She denies rest pain.  Past Medical History  Diagnosis Date  . Diabetes mellitus     on Lantus 30 U   . CAD (coronary artery disease)     S/p CABG and stenting  . S/P CABG (coronary artery bypass graft)   . PAD (peripheral artery disease)   . Stroke     MRI 11/2011 with remote occipital lobe. MRA with moderate left focal vertebral artery stenosis  . CHF (congestive heart failure) 11/2011    Echo with EF 30-35%, global hypokinesis, and inferior akinesis  . Pneumonia   . Hypothyroidism   . Hyperlipidemia   . DDD (degenerative disc disease), lumbar   . CVA (cerebral vascular accident) 11/2010  . MI (myocardial infarction) 1997  . COPD (chronic obstructive pulmonary  disease)   . Chronic heart failure   . Thyroid disease   . Anemia   . Chronic kidney disease   . Heart disease   . Irregular heart beat     Past Surgical History  Procedure Date  . Ptca   . Thyroidectomy   . Coronary artery bypass graft     2 vessel  . Carotid endarterectomy ~2008    Left   . Cholecystectomy, laparoscopic less than 6 months ago  . Cholecystectomy   . Tracheostomy tube placement 01/02/2012    Procedure: TRACHEOSTOMY;  Surgeon: Christia Reading, MD;  Location: [facility] OR;  Service: ENT;  Laterality: N/A;  . Direct laryngoscopy 01/02/2012    Procedure: DIRECT LARYNGOSCOPY;  Surgeon: Christia Reading, MD;  Location: [facility] OR;  Service: ENT;  Laterality: N/A;  with Biopsy  . Angioplasty      Social History History  Substance Use Topics  . Smoking status: Former Smoker -- 1.0 packs/day for 50 years    Types: Cigarettes    Quit date: 01/24/2011  . Smokeless tobacco: Never Used  . Alcohol Use: No    Family History Family History  Problem Relation Age of Onset  . Hyperlipidemia Mother   . Other Mother     AAA  . Diabetes Daughter     Allergies  Allergies  Allergen Reactions  . Crestor (Rosuvastatin Calcium) Other (See Comments)    Muscle Pain  . Lisinopril Other (See Comments)    "Lowers her BP too low"  . Vicodin (Hydrocodone-Acetaminophen) Nausea And Vomiting  Current Outpatient Prescriptions  Medication Sig Dispense Refill  . acetaminophen (TYLENOL) 500 MG tablet Take 500 mg by mouth every 6 (six) hours as needed. For pain      . albuterol (PROVENTIL) (2.5 MG/3ML) 0.083% nebulizer solution Take 2.5 mg by nebulization 4 (four) times daily.      Marland Kitchen aspirin EC 81 MG tablet Take 81 mg by mouth daily.      . carvedilol (COREG) 25 MG tablet Take 25 mg by mouth 2 (two) times daily with a meal.      . clopidogrel (PLAVIX) 75 MG tablet Take 75 mg by mouth daily.      . insulin aspart (NOVOLOG) 100 UNIT/ML injection Inject 7 Units into the skin 3 (three) times daily  before meals.      . insulin glargine (LANTUS) 100 UNIT/ML injection Inject 22 Units into the skin at bedtime.      Marland Kitchen ipratropium (ATROVENT) 0.02 % nebulizer solution Take 0.5 mg by nebulization 4 (four) times daily.      . isosorbide mononitrate (IMDUR) 30 MG 24 hr tablet Take 30 mg by mouth daily.      Marland Kitchen loperamide (IMODIUM) 2 MG capsule Take 4 mg by mouth 4 (four) times daily as needed. For diarrhea      . losartan (COZAAR) 100 MG tablet Take 100 mg by mouth daily. Hold if SBP < 110: Hold if HR < 60      . nitroGLYCERIN (NITROSTAT) 0.4 MG SL tablet Place 0.4 mg under the tongue every 5 (five) minutes as needed. For chest pain      . simvastatin (ZOCOR) 40 MG tablet Take 40 mg by mouth at bedtime.      . traMADol (ULTRAM) 50 MG tablet Take 50 mg by mouth every 6 (six) hours as needed. For pain      . famotidine (PEPCID) 20 MG tablet Take 20 mg by mouth at bedtime.      . gabapentin (NEURONTIN) 300 MG capsule Take 300 mg by mouth 2 (two) times daily.      . mometasone (NASONEX) 50 MCG/ACT nasal spray Place 2 sprays into the nose at bedtime.      . pantoprazole (PROTONIX) 40 MG tablet Take 40 mg by mouth 2 (two) times daily before a meal.      . pregabalin (LYRICA) 25 MG capsule Take 25 mg by mouth 2 (two) times daily.      Marland Kitchen DISCONTD: dexlansoprazole (DEXILANT) 60 MG capsule Take 60 mg by mouth daily.      Marland Kitchen DISCONTD: furosemide (LASIX) 20 MG tablet Take 3 tablets (60 mg total) by mouth daily.  30 tablet  5   No current facility-administered medications for this visit.   Facility-Administered Medications Ordered in Other Visits  Medication Dose Route Frequency Provider Last Rate Last Dose  . iohexol (OMNIPAQUE) 350 MG/ML injection 100 mL  100 mL Intravenous Once PRN Medication Radiologist, MD   100 mL at 02/23/12 1253    ROS:   General:  No weight loss, Fever, chills   Neurologic: No dizziness, blackouts, seizures. No recent symptoms of stroke or mini- stroke. No recent episodes of  slurred speech, or temporary blindness.  Cardiac: No recent episodes of chest pain/pressure, no shortness of breath at rest.  No shortness of breath with exertion.  Denies history of atrial fibrillation or irregular heartbeat  Vascular: No history of rest pain in feet.  + history of claudication.  No history of non-healing ulcer, No history  of DVT   Pulmonary: + home oxygen, no productive cough, no hemoptysis,  No asthma or wheezing  Urinary: [x ] chronic Kidney disease, [ ]  on HD - [ ]  MWF or [ ]  TTHS, [ ]  Burning with urination, [ ]  Frequent urination, [ ]  Difficulty urinating;     Physical Examination  Filed Vitals:   02/23/12 1453 02/23/12 1454  BP: 204/87 187/76  Pulse: 79 81  Temp: 97.8 F (36.6 C)   TempSrc: Oral   Height: 5\' 1"  (1.549 m)   Weight: 150 lb (68.04 kg)     Body mass index is 28.34 kg/(m^2).  General:  Alert and oriented, no acute distress HEENT: Normal Neck: No bruit or JVD Pulmonary: Clear to auscultation bilaterally Cardiac: Regular Rate and Rhythm without murmur Gastrointestinal: Soft, non-tender, non-distended, no mass, no scars Skin: No rash Extremity Pulses:  2+ radial, brachial, femoral, absent dorsalis pedis, posterior tibial pulses bilaterally Musculoskeletal: No deformity or edema  Neurologic: Upper and lower extremity motor 5/5 and symmetric  DATA: Bilateral ABIs from today were reviewed and interpreted. ABI on the right was 0.96 with biphasic waveforms left side was 0.32 with monophasic waveforms. Chest had a CT Angio the neck which I reviewed today. This shows a heavily calcified high-grade greater than 70% stenosis of the right internal carotid artery with mild recurrent stenosis of the left side. She also had evidence of vertebral disease bilaterally.   ASSESSMENT:   High-grade asymptomatic right internal carotid artery stenosis with recent stroke in February. Patient is currently asymptomatic. Had lengthy discussion with the patient and  her daughter today and I believe that she would benefit from carotid arteriogram to further define the extent of the lesion to see whether or not this is approachable from a cervical approach. It appears fairly high on the CT Angio. If we do consider carotid endarterectomy she would be a high-risk patient due to the tracheostomy. She is scheduled to see Dr. Jenne Pane in the near future. However the family states that there were no plans to remove this tracheostomy any time soon. She potentially would be a candidate for carotid stenting but the lesion was very calcified on the CT Angio. If there is heavy calcification this may not be responsive to carotid stenting. Risks benefits possible complications and procedure details were trying to the patient and her daughter today including but limited to bleeding contrast reaction possible stroke risk I understand agree to proceed her carotid angiogram will be scheduled for May 31.  As far as her lower sternum these are concerned she still has claudication symptoms in her left lower extremity. Her ABIs were fairly low on that side she does not really have rest pain and has no history of nonhealing ulcers. If we have not given her to much contrast if her carotid angiogram will also study her lower extremities. Otherwise we will do that at an additional interval later.   PLAN:  See above   Fabienne Bruns, MD [facility] Office: [phone] Pager: [phone]

## 2012-02-27 ENCOUNTER — Other Ambulatory Visit: Payer: Self-pay | Admitting: *Deleted

## 2012-02-27 NOTE — Telephone Encounter (Signed)
Open in error

## 2012-03-02 ENCOUNTER — Encounter: Payer: Self-pay | Admitting: Vascular Surgery

## 2012-03-02 ENCOUNTER — Other Ambulatory Visit: Payer: Self-pay | Admitting: *Deleted

## 2012-03-02 NOTE — Procedures (Unsigned)
CAROTID DUPLEX EXAM  INDICATION:  Followup carotid artery disease.  HISTORY: Diabetes:  Yes Cardiac:  Yes Hypertension:  Yes Smoking:  Previously Previous Surgery:  Left carotid endarterectomy 2006. CV History:  CVA January 2013, asymptomatic Amaurosis Fugax No, Paresthesias No, Hemiparesis No                                      RIGHT             LEFT Brachial systolic pressure:         160               164 Brachial Doppler waveforms:         Triphasic         Triphasic Vertebral direction of flow:        Antegrade         Antegrade DUPLEX VELOCITIES (cm/sec) CCA peak systolic                   82                99 ECA peak systolic                   40                64 ICA peak systolic                   119 (curve)       148 (curve) ICA end diastolic                   35                54 PLAQUE MORPHOLOGY:                  Mixed             Mixed PLAQUE AMOUNT:                      Mild to moderate  Mild to moderate PLAQUE LOCATION:                    CCA, ICA, bifurcation               CCA  IMPRESSION:  Right:  Tortuous proximal CCA.  1% to 39% ICA stenosis. Unable to obtain higher velocities as obtained on prior exam 02/02/2012. Left:  Velocities are in the 40% to 59% ICA range stenosis, at area of curve. This was a technically difficult exam due to a trach.  ___________________________________________ Janetta Hora. Fields, MD  SS/MEDQ  D:  02/24/2012  T:  02/24/2012  Job:  [id number]

## 2012-03-09 ENCOUNTER — Encounter (HOSPITAL_COMMUNITY): Payer: Self-pay | Admitting: Pharmacy Technician

## 2012-03-23 ENCOUNTER — Ambulatory Visit (HOSPITAL_COMMUNITY)
Admission: RE | Admit: 2012-03-23 | Payer: No Typology Code available for payment source | Source: Ambulatory Visit | Admitting: Vascular Surgery

## 2012-03-23 ENCOUNTER — Encounter (HOSPITAL_COMMUNITY): Admission: RE | Payer: Self-pay | Source: Ambulatory Visit

## 2012-03-23 SURGERY — CAROTID ANGIOGRAM
Anesthesia: LOCAL

## 2012-03-29 ENCOUNTER — Telehealth: Payer: Self-pay | Admitting: *Deleted

## 2012-03-29 NOTE — Telephone Encounter (Signed)
Due to work [name] had cancelled the arch/carotid agm scheduled on 03/23/12 & wanted to know if it could wait until end of June. I spoke with Dr Darrick Penna & he said that was fine as long as she remained asymptomatic. If she had symptoms please go to ER & we would probably have to go ahead with procedure. I left a message saying this & also, told her we would have to bring her in and do an H&P since it would be over 30 days from last office visit.

## 2012-04-03 ENCOUNTER — Telehealth: Payer: Self-pay | Admitting: Vascular Surgery

## 2012-04-03 NOTE — Telephone Encounter (Signed)
h&p prior to arch carotid agm/ok to see rusty per cef/judy's staff message from 03/29/12  on 04/03/12 pt's daughter Elonda Husky called to cancel appt on 04/05/12--she stated she will call us back to rsc when she can get off work to bring pt/awt

## 2012-04-05 ENCOUNTER — Ambulatory Visit: Payer: No Typology Code available for payment source | Admitting: Neurosurgery

## 2012-05-10 ENCOUNTER — Telehealth: Payer: Self-pay | Admitting: Neurosurgery

## 2012-05-10 NOTE — Telephone Encounter (Signed)
Message copied by Fredrich Birks on Thu May 10, 2012  1:48 PM ------      Message from: Melene Plan      Created: Thu May 10, 2012 11:27 AM      Regarding: RE: Scheduling       Will you document this in a telephone call;please.      ----- Message -----         From: Fredrich Birks         Sent: 05/10/2012  11:08 AM           To: Melene Plan, RN      Subject: Scheduling                                               Pt was scheduled back in June for H & P with Rusty prior to carotid agram, but her granddaughter has called and cancelled her appt. We have called to see if they want to r/s, but all attempts were failures. So, I just wanted to alert you that Mrs Sills had not had her carotid agram, and had not rescheduled as of today.            Thanks,       Annabelle Harman

## 2012-05-10 NOTE — Telephone Encounter (Signed)
documentation

## 2012-07-13 ENCOUNTER — Emergency Department (HOSPITAL_COMMUNITY)
Admission: EM | Admit: 2012-07-13 | Discharge: 2012-07-13 | Disposition: A | Discharge status: Home / Self Care | Payer: Medicare Other | Attending: Emergency Medicine | Admitting: Emergency Medicine

## 2012-07-13 ENCOUNTER — Emergency Department (HOSPITAL_COMMUNITY): Payer: Medicare Other

## 2012-07-13 ENCOUNTER — Encounter (HOSPITAL_COMMUNITY): Payer: Self-pay | Admitting: Emergency Medicine

## 2012-07-13 DIAGNOSIS — I509 Heart failure, unspecified: Secondary | ICD-10-CM | POA: Insufficient documentation

## 2012-07-13 DIAGNOSIS — J40 Bronchitis, not specified as acute or chronic: Secondary | ICD-10-CM | POA: Insufficient documentation

## 2012-07-13 DIAGNOSIS — Z9089 Acquired absence of other organs: Secondary | ICD-10-CM | POA: Insufficient documentation

## 2012-07-13 DIAGNOSIS — E119 Type 2 diabetes mellitus without complications: Secondary | ICD-10-CM | POA: Insufficient documentation

## 2012-07-13 DIAGNOSIS — Z951 Presence of aortocoronary bypass graft: Secondary | ICD-10-CM | POA: Insufficient documentation

## 2012-07-13 DIAGNOSIS — Z79899 Other long term (current) drug therapy: Secondary | ICD-10-CM | POA: Insufficient documentation

## 2012-07-13 DIAGNOSIS — I2510 Atherosclerotic heart disease of native coronary artery without angina pectoris: Secondary | ICD-10-CM | POA: Insufficient documentation

## 2012-07-13 DIAGNOSIS — N189 Chronic kidney disease, unspecified: Secondary | ICD-10-CM | POA: Insufficient documentation

## 2012-07-13 DIAGNOSIS — I129 Hypertensive chronic kidney disease with stage 1 through stage 4 chronic kidney disease, or unspecified chronic kidney disease: Secondary | ICD-10-CM | POA: Insufficient documentation

## 2012-07-13 DIAGNOSIS — Z794 Long term (current) use of insulin: Secondary | ICD-10-CM | POA: Insufficient documentation

## 2012-07-13 DIAGNOSIS — Z8673 Personal history of transient ischemic attack (TIA), and cerebral infarction without residual deficits: Secondary | ICD-10-CM | POA: Insufficient documentation

## 2012-07-13 MED ORDER — MOXIFLOXACIN HCL 400 MG PO TABS: 400.0000 mg | ORAL_TABLET | Freq: Every day | ORAL | Status: DC

## 2012-07-13 MED ORDER — MOXIFLOXACIN HCL 400 MG PO TABS
400.0000 mg | ORAL_TABLET | Freq: Once | ORAL | Status: AC
  Administered 2012-07-13: 400 mg via ORAL
  Filled 2012-07-13: qty 1

## 2012-07-13 NOTE — ED Notes (Signed)
Pt presents to the the Ed with Shortness of Breath.  PT has a trach in place that was place due to multiple intubation.  Patient was suctioned at 1830 with some relief.  Janina Mayo was placed in March of this year.

## 2012-07-13 NOTE — Progress Notes (Signed)
Educated pt and care giver on tracheal suction technique.

## 2012-07-13 NOTE — ED Notes (Signed)
Pt o2 was 93-93% without o2. Pt got up to use restroom and sats went down to 87%, placed o2 back on pt.

## 2012-07-13 NOTE — ED Provider Notes (Signed)
History     CSN: [id number]  Arrival date & time 07/13/12  Mikle Bosworth   First MD Initiated Contact with Patient 07/13/12 1920      Chief Complaint  Patient presents with  . Shortness of Breath    (Consider location/radiation/quality/duration/timing/severity/associated sxs/prior treatment) HPI Comments: Averyana Pillars is a 66 y.o. Female who has had a cough for 2 weeks that has become productive of yellow sputum today. She was short of breath, and he did not improve when her tracheostomy was suctioned. She also has been using oxygen today, when usually she only uses it at night. She denies fever, chills, nausea, vomiting, chest pain. There's been no extremity, or abdominal pain. She's using her usual medications without relief. There are no known aggravating or other palliative factors.  Patient is a 66 y.o. female presenting with shortness of breath. The history is provided by the patient.  Shortness of Breath  Associated symptoms include shortness of breath.    Past Medical History  Diagnosis Date  . Diabetes mellitus     on Lantus 30 U   . CAD (coronary artery disease)     S/p CABG and stenting  . PAD (peripheral artery disease)   . Stroke     MRI 11/2011 with remote occipital lobe. MRA with moderate left focal vertebral artery stenosis  . CHF (congestive heart failure) 11/2011    Echo with EF 30-35%, global hypokinesis, and inferior akinesis  . Hyperlipidemia   . DDD (degenerative disc disease), lumbar   . CVA (cerebral vascular accident) 11/2010  . MI (myocardial infarction) 1997  . COPD (chronic obstructive pulmonary disease)   . Anemia   . Chronic kidney disease   . Irregular heart beat   . Hypertension   . GERD (gastroesophageal reflux disease)   . History of IBS   . Hypothyroidism     Goiter  . Thyroid disease   . Carotid artery occlusion     Past Surgical History  Procedure Date  . Ptca   . Thyroidectomy   . Coronary artery bypass graft     2 vessel  . Carotid  endarterectomy ~2008    Left   . Cholecystectomy   . Tracheostomy tube placement 01/02/2012  . Angioplasty [id number]    Aortogram by Dr. [place] McKenzie [facility])  . Pr vein bypass graft,aorto-fem-pop     Right common femoral-AK popliteal BPG & Right Popliteal-posterior tibial  . Pr vein bypass graft,aorto-fem-pop     Left Fem-pop BPG    Family History  Problem Relation Age of Onset  . Hyperlipidemia Mother   . Other Mother     AAA  . Diabetes Daughter     History  Substance Use Topics  . Smoking status: Former Smoker -- 1.0 packs/day for 50 years    Types: Cigarettes    Quit date: 01/24/2011  . Smokeless tobacco: Never Used  . Alcohol Use: No    OB History    Grav Para Term Preterm Abortions TAB SAB Ect Mult Living                  Review of Systems  Respiratory: Positive for shortness of breath.   All other systems reviewed and are negative.    Allergies  Crestor; Lisinopril; and Vicodin  Home Medications   Current Outpatient Rx  Name Route Sig Dispense Refill  . ACETAMINOPHEN 500 MG PO TABS Oral Take 500 mg by mouth every 6 (six) hours as needed. For pain    .  ALBUTEROL SULFATE (2.5 MG/3ML) 0.083% IN NEBU Nebulization Take 2.5 mg by nebulization 4 (four) times daily.    . ASPIRIN EC 81 MG PO TBEC Oral Take 81 mg by mouth daily.    Marland Kitchen CARVEDILOL 25 MG PO TABS Oral Take 25 mg by mouth 2 (two) times daily with a meal.    . CLOPIDOGREL BISULFATE 75 MG PO TABS Oral Take 75 mg by mouth daily.    . FUROSEMIDE 40 MG PO TABS Oral Take 40 mg by mouth daily. May take 1 additional dose for a weight gain of 2 lbs    . INSULIN ASPART 100 UNIT/ML [place] SOLN Subcutaneous Inject 2-10 Units into the skin 3 (three) times daily before meals. Sliding scale as directed    . INSULIN GLARGINE 100 UNIT/ML [place] SOLN Subcutaneous Inject 22 Units into the skin at bedtime.    . IPRATROPIUM BROMIDE 0.02 % IN SOLN Nebulization Take 0.5 mg by nebulization 4 (four) times daily.    .  ISOSORBIDE MONONITRATE ER 30 MG PO TB24 Oral Take 30 mg by mouth daily.    Marland Kitchen LOPERAMIDE HCL 2 MG PO CAPS Oral Take 4 mg by mouth 4 (four) times daily as needed. For diarrhea    . LORAZEPAM 0.5 MG PO TABS Oral Take 0.5 mg by mouth at bedtime.     Marland Kitchen LOSARTAN POTASSIUM 100 MG PO TABS Oral Take 100 mg by mouth daily. Hold if SBP < 110: Hold if HR < 60    . NITROGLYCERIN 0.4 MG SL SUBL Sublingual Place 0.4 mg under the tongue every 5 (five) minutes as needed. For chest pain    . PANTOPRAZOLE SODIUM 40 MG PO TBEC Oral Take 40 mg by mouth daily as needed. For heartburn    . SIMVASTATIN 40 MG PO TABS Oral Take 40 mg by mouth every evening.     Marland Kitchen TRAMADOL HCL 50 MG PO TABS Oral Take 50 mg by mouth every 6 (six) hours as needed. For pain    . MOXIFLOXACIN HCL 400 MG PO TABS Oral Take 1 tablet (400 mg total) by mouth daily. 9 tablet 0    BP 195/86  Pulse 91  Temp 98.7 F (37.1 C) (Oral)  Resp 22  SpO2 98%  Physical Exam  Nursing note and vitals reviewed. Constitutional: She is oriented to person, place, and time. She appears well-developed and well-nourished.  HENT:  Head: Normocephalic and atraumatic.  Eyes: Conjunctivae normal and EOM are normal. Pupils are equal, round, and reactive to light.  Neck: Normal range of motion and phonation normal. Neck supple.  Cardiovascular: Normal rate, regular rhythm and intact distal pulses.   Pulmonary/Chest: Effort normal and breath sounds normal. No respiratory distress. She has no wheezes. She has no rales. She exhibits no tenderness.       Tracheostomy is patent without discharge. Scattered rhonchi.   Abdominal: Soft. She exhibits no distension. There is no tenderness. There is no guarding.  Musculoskeletal: Normal range of motion.  Neurological: She is alert and oriented to person, place, and time. She has normal strength. She exhibits normal muscle tone.  Skin: Skin is warm and dry.  Psychiatric: She has a normal mood and affect. Her behavior is  normal. Judgment and thought content normal.    ED Course  Procedures (including critical care time)  Emergency department treatment: Oral Avelox.  Oxygen saturation monitored with on and off oxygen. With oxygen at 4 L, she was 100%.  When taken off oxygen the  Sat was ,     Labs Reviewed - No data to display Dg Chest [facility] 1 View  07/13/2012  *RADIOLOGY REPORT*  Clinical Data: Shortness of breath, cough  PORTABLE CHEST - 1 VIEW  Comparison: 01/07/2012  Findings: Cardiomegaly again noted.  Status post median sternotomy. Stable tracheostomy tube position.  There is central mild vascular congestion and mild interstitial prominence bilaterally highly suspicious for mild interstitial edema.  Question small bilateral pleural effusion.  IMPRESSION:  Stable tracheostomy tube position.  There is central mild vascular congestion and mild interstitial prominence bilaterally highly suspicious for mild interstitial edema.  Question small bilateral pleural effusion.   Original Report Authenticated By: Natasha Mead, M.D.      1. Bronchitis       MDM  Bronchitis with stable vitals and O2 sat. Doubt sepsis, metabolic instability. She is stable for d/c.   Plan: Home Medications- Avelox; Home Treatments- rest; Recommended follow up- PCP in 5 days and prn        Flint Melter, MD 07/14/12 1315

## 2012-07-15 NOTE — ED Notes (Signed)
Patient's family brought prescription to ED requesting change due to Avelox too expensive. Chart taken to ED PA Fayrene Helper who changed RX to Z-pack 500 mg PO once daily for four days. New RX called to [facility] [address] [phone]

## 2012-10-18 ENCOUNTER — Emergency Department (HOSPITAL_COMMUNITY): Payer: Medicare Other

## 2012-10-18 ENCOUNTER — Encounter (HOSPITAL_COMMUNITY): Payer: Self-pay | Admitting: Emergency Medicine

## 2012-10-18 ENCOUNTER — Inpatient Hospital Stay (HOSPITAL_COMMUNITY)
Admission: EM | Admit: 2012-10-18 | Discharge: 2012-10-23 | DRG: 194 | Disposition: A | Discharge status: Home / Self Care | Payer: Medicare Other | Attending: Internal Medicine | Admitting: Internal Medicine

## 2012-10-18 DIAGNOSIS — Z8639 Personal history of other endocrine, nutritional and metabolic disease: Secondary | ICD-10-CM

## 2012-10-18 DIAGNOSIS — I6529 Occlusion and stenosis of unspecified carotid artery: Secondary | ICD-10-CM

## 2012-10-18 DIAGNOSIS — G629 Polyneuropathy, unspecified: Secondary | ICD-10-CM

## 2012-10-18 DIAGNOSIS — Z87891 Personal history of nicotine dependence: Secondary | ICD-10-CM

## 2012-10-18 DIAGNOSIS — E875 Hyperkalemia: Secondary | ICD-10-CM

## 2012-10-18 DIAGNOSIS — J386 Stenosis of larynx: Secondary | ICD-10-CM

## 2012-10-18 DIAGNOSIS — E1165 Type 2 diabetes mellitus with hyperglycemia: Secondary | ICD-10-CM | POA: Diagnosis present

## 2012-10-18 DIAGNOSIS — I639 Cerebral infarction, unspecified: Secondary | ICD-10-CM

## 2012-10-18 DIAGNOSIS — I739 Peripheral vascular disease, unspecified: Secondary | ICD-10-CM

## 2012-10-18 DIAGNOSIS — D649 Anemia, unspecified: Secondary | ICD-10-CM | POA: Diagnosis present

## 2012-10-18 DIAGNOSIS — Z930 Tracheostomy status: Secondary | ICD-10-CM

## 2012-10-18 DIAGNOSIS — I2510 Atherosclerotic heart disease of native coronary artery without angina pectoris: Secondary | ICD-10-CM | POA: Diagnosis present

## 2012-10-18 DIAGNOSIS — N189 Chronic kidney disease, unspecified: Secondary | ICD-10-CM | POA: Diagnosis present

## 2012-10-18 DIAGNOSIS — Z96649 Presence of unspecified artificial hip joint: Secondary | ICD-10-CM

## 2012-10-18 DIAGNOSIS — I5042 Chronic combined systolic (congestive) and diastolic (congestive) heart failure: Secondary | ICD-10-CM | POA: Diagnosis present

## 2012-10-18 DIAGNOSIS — I10 Essential (primary) hypertension: Secondary | ICD-10-CM

## 2012-10-18 DIAGNOSIS — R0602 Shortness of breath: Secondary | ICD-10-CM

## 2012-10-18 DIAGNOSIS — E039 Hypothyroidism, unspecified: Secondary | ICD-10-CM | POA: Diagnosis present

## 2012-10-18 DIAGNOSIS — Z8673 Personal history of transient ischemic attack (TIA), and cerebral infarction without residual deficits: Secondary | ICD-10-CM

## 2012-10-18 DIAGNOSIS — J449 Chronic obstructive pulmonary disease, unspecified: Secondary | ICD-10-CM | POA: Diagnosis present

## 2012-10-18 DIAGNOSIS — I129 Hypertensive chronic kidney disease with stage 1 through stage 4 chronic kidney disease, or unspecified chronic kidney disease: Secondary | ICD-10-CM | POA: Diagnosis present

## 2012-10-18 DIAGNOSIS — E119 Type 2 diabetes mellitus without complications: Secondary | ICD-10-CM

## 2012-10-18 DIAGNOSIS — I509 Heart failure, unspecified: Secondary | ICD-10-CM | POA: Diagnosis present

## 2012-10-18 DIAGNOSIS — J4489 Other specified chronic obstructive pulmonary disease: Secondary | ICD-10-CM | POA: Diagnosis present

## 2012-10-18 DIAGNOSIS — I252 Old myocardial infarction: Secondary | ICD-10-CM

## 2012-10-18 DIAGNOSIS — N179 Acute kidney failure, unspecified: Secondary | ICD-10-CM

## 2012-10-18 DIAGNOSIS — J189 Pneumonia, unspecified organism: Principal | ICD-10-CM | POA: Diagnosis present

## 2012-10-18 DIAGNOSIS — I5022 Chronic systolic (congestive) heart failure: Secondary | ICD-10-CM

## 2012-10-18 DIAGNOSIS — K219 Gastro-esophageal reflux disease without esophagitis: Secondary | ICD-10-CM | POA: Diagnosis present

## 2012-10-18 DIAGNOSIS — Z9861 Coronary angioplasty status: Secondary | ICD-10-CM

## 2012-10-18 DIAGNOSIS — E785 Hyperlipidemia, unspecified: Secondary | ICD-10-CM | POA: Diagnosis present

## 2012-10-18 DIAGNOSIS — Z951 Presence of aortocoronary bypass graft: Secondary | ICD-10-CM

## 2012-10-18 LAB — CBC WITH DIFFERENTIAL/PLATELET
Basophils Absolute: 0 10*3/uL (ref 0.0–0.1)
Basophils Relative: 0 % (ref 0–1)
Eosinophils Absolute: 0 10*3/uL (ref 0.0–0.7)
Hemoglobin: 9.4 g/dL — ABNORMAL LOW (ref 12.0–15.0)
MCH: 29.4 pg (ref 26.0–34.0)
MCHC: 33 g/dL (ref 30.0–36.0)
Monocytes Absolute: 1.2 10*3/uL — ABNORMAL HIGH (ref 0.1–1.0)
Monocytes Relative: 12 % (ref 3–12)
Neutro Abs: 8.5 10*3/uL — ABNORMAL HIGH (ref 1.7–7.7)
Neutrophils Relative %: 85 % — ABNORMAL HIGH (ref 43–77)
RDW: 15.4 % (ref 11.5–15.5)

## 2012-10-18 LAB — PRO B NATRIURETIC PEPTIDE: Pro B Natriuretic peptide (BNP): 6325 pg/mL — ABNORMAL HIGH (ref 0–125)

## 2012-10-18 LAB — URINALYSIS, ROUTINE W REFLEX MICROSCOPIC
Glucose, UA: 500 mg/dL — AB
Nitrite: NEGATIVE
pH: 5 (ref 5.0–8.0)

## 2012-10-18 LAB — BASIC METABOLIC PANEL
BUN: 20 mg/dL (ref 6–23)
Chloride: 102 mEq/L (ref 96–112)
Creatinine, Ser: 1.21 mg/dL — ABNORMAL HIGH (ref 0.50–1.10)
GFR calc Af Amer: 53 mL/min — ABNORMAL LOW (ref >90)
GFR calc non Af Amer: 46 mL/min — ABNORMAL LOW (ref >90)
Potassium: 3.6 mEq/L (ref 3.5–5.1)

## 2012-10-18 LAB — URINE MICROSCOPIC-ADD ON

## 2012-10-18 MED ORDER — IPRATROPIUM BROMIDE 0.02 % IN SOLN
0.5000 mg | Freq: Four times a day (QID) | RESPIRATORY_TRACT | Status: DC
  Administered 2012-10-19 – 2012-10-23 (×18): 0.5 mg via RESPIRATORY_TRACT
  Filled 2012-10-18 (×19): qty 2.5

## 2012-10-18 MED ORDER — DEXTROSE 5 % IV SOLN
1.0000 g | Freq: Once | INTRAVENOUS | Status: AC
  Administered 2012-10-18: 1 g via INTRAVENOUS
  Filled 2012-10-18: qty 10

## 2012-10-18 MED ORDER — PANTOPRAZOLE SODIUM 40 MG PO TBEC: 40.0000 mg | DELAYED_RELEASE_TABLET | Freq: Every day | ORAL | Status: DC | PRN

## 2012-10-18 MED ORDER — SODIUM CHLORIDE 0.9 % IV SOLN
INTRAVENOUS | Status: DC
  Administered 2012-10-19: via INTRAVENOUS

## 2012-10-18 MED ORDER — HEPARIN SODIUM (PORCINE) 5000 UNIT/ML IJ SOLN
5000.0000 [IU] | Freq: Three times a day (TID) | INTRAMUSCULAR | Status: DC
  Administered 2012-10-19 – 2012-10-23 (×15): 5000 [IU] via SUBCUTANEOUS
  Filled 2012-10-18 (×17): qty 1

## 2012-10-18 MED ORDER — LORAZEPAM 0.5 MG PO TABS
0.5000 mg | ORAL_TABLET | Freq: Every day | ORAL | Status: DC
  Administered 2012-10-19 – 2012-10-22 (×5): 0.5 mg via ORAL
  Filled 2012-10-18 (×5): qty 1

## 2012-10-18 MED ORDER — CLOPIDOGREL BISULFATE 75 MG PO TABS: 75.0000 mg | ORAL_TABLET | Freq: Every day | ORAL | Status: DC

## 2012-10-18 MED ORDER — TRAMADOL HCL 50 MG PO TABS
50.0000 mg | ORAL_TABLET | Freq: Four times a day (QID) | ORAL | Status: DC | PRN
  Filled 2012-10-18: qty 1

## 2012-10-18 MED ORDER — ASPIRIN EC 81 MG PO TBEC
81.0000 mg | DELAYED_RELEASE_TABLET | Freq: Every day | ORAL | Status: DC
  Administered 2012-10-19 – 2012-10-23 (×5): 81 mg via ORAL
  Filled 2012-10-18 (×5): qty 1

## 2012-10-18 MED ORDER — ACETAMINOPHEN 500 MG PO TABS
500.0000 mg | ORAL_TABLET | Freq: Four times a day (QID) | ORAL | Status: DC | PRN
  Administered 2012-10-19: 500 mg via ORAL
  Filled 2012-10-18 (×2): qty 1

## 2012-10-18 MED ORDER — LOSARTAN POTASSIUM 50 MG PO TABS: 100.0000 mg | ORAL_TABLET | Freq: Every day | ORAL | Status: DC

## 2012-10-18 MED ORDER — ASPIRIN 81 MG PO CHEW
162.0000 mg | CHEWABLE_TABLET | Freq: Once | ORAL | Status: AC
  Administered 2012-10-18: 162 mg via ORAL
  Filled 2012-10-18: qty 2

## 2012-10-18 MED ORDER — ALBUTEROL SULFATE (5 MG/ML) 0.5% IN NEBU
2.5000 mg | INHALATION_SOLUTION | RESPIRATORY_TRACT | Status: DC | PRN
  Administered 2012-10-21 – 2012-10-23 (×7): 2.5 mg via RESPIRATORY_TRACT
  Filled 2012-10-18 (×8): qty 0.5

## 2012-10-18 MED ORDER — INSULIN GLARGINE 100 UNIT/ML ~~LOC~~ SOLN
22.0000 [IU] | Freq: Every day | SUBCUTANEOUS | Status: DC
  Administered 2012-10-19 – 2012-10-22 (×5): 22 [IU] via SUBCUTANEOUS
  Filled 2012-10-18: qty 1

## 2012-10-18 MED ORDER — AZITHROMYCIN 500 MG PO TABS
500.0000 mg | ORAL_TABLET | ORAL | Status: DC
  Filled 2012-10-18: qty 1

## 2012-10-18 MED ORDER — CARVEDILOL 25 MG PO TABS
25.0000 mg | ORAL_TABLET | Freq: Two times a day (BID) | ORAL | Status: DC
  Administered 2012-10-19 – 2012-10-23 (×10): 25 mg via ORAL
  Filled 2012-10-18 (×11): qty 1

## 2012-10-18 MED ORDER — INSULIN ASPART 100 UNIT/ML ~~LOC~~ SOLN: 2.0000 [IU] | Freq: Three times a day (TID) | SUBCUTANEOUS | Status: DC

## 2012-10-18 MED ORDER — SIMVASTATIN 40 MG PO TABS
40.0000 mg | ORAL_TABLET | Freq: Every evening | ORAL | Status: DC
  Administered 2012-10-19 – 2012-10-23 (×5): 40 mg via ORAL
  Filled 2012-10-18 (×5): qty 1

## 2012-10-18 MED ORDER — DEXTROSE 5 % IV SOLN
1.0000 g | INTRAVENOUS | Status: DC
  Administered 2012-10-19 – 2012-10-22 (×4): 1 g via INTRAVENOUS
  Filled 2012-10-18 (×5): qty 10

## 2012-10-18 MED ORDER — ISOSORBIDE MONONITRATE ER 30 MG PO TB24
30.0000 mg | ORAL_TABLET | Freq: Every day | ORAL | Status: DC
  Administered 2012-10-19 – 2012-10-23 (×5): 30 mg via ORAL
  Filled 2012-10-18 (×5): qty 1

## 2012-10-18 NOTE — ED Provider Notes (Signed)
History     CSN: [id number]  Arrival date & time 10/18/12  2019   First MD Initiated Contact with Patient 10/18/12 2048      Chief Complaint  Patient presents with  . Cough    (Consider location/radiation/quality/duration/timing/severity/associated sxs/prior treatment) HPI Comments: Pt comes in with cc of cough. Pt has extensive medical hx, including trach for subglottic stenosis, DM, CAD, COPD. Pt states that she has been feeling ill for the past few days. She has increased cough, increased secretion, and yellow green phlegm with subjective fevers. Pt also has anorexia, and has been missing some of his meds due to that. She saw her pcp today, and was sent home with AZT. With the cough, there is no chest pain, but she does have mild SOB. Pt has no orthopnea or PND.  Patient is a 66 y.o. female presenting with cough. The history is provided by the patient.  Cough Associated symptoms include shortness of breath. Pertinent negatives include no chest pain and no headaches.    Past Medical History  Diagnosis Date  . Diabetes mellitus     on Lantus 30 U   . CAD (coronary artery disease)     S/p CABG and stenting  . PAD (peripheral artery disease)   . Stroke     MRI 11/2011 with remote occipital lobe. MRA with moderate left focal vertebral artery stenosis  . CHF (congestive heart failure) 11/2011    Echo with EF 30-35%, global hypokinesis, and inferior akinesis  . Hyperlipidemia   . DDD (degenerative disc disease), lumbar   . CVA (cerebral vascular accident) 11/2010  . MI (myocardial infarction) 1997  . COPD (chronic obstructive pulmonary disease)   . Anemia   . Chronic kidney disease   . Irregular heart beat   . Hypertension   . GERD (gastroesophageal reflux disease)   . History of IBS   . Hypothyroidism     Goiter  . Thyroid disease   . Carotid artery occlusion     Past Surgical History  Procedure Date  . Ptca   . Thyroidectomy   . Coronary artery bypass graft    2 vessel  . Carotid endarterectomy ~2008    Left   . Cholecystectomy   . Tracheostomy tube placement 01/02/2012  . Angioplasty [id number]    Aortogram by Dr. [place] McKenzie [facility])  . Pr vein bypass graft,aorto-fem-pop     Right common femoral-AK popliteal BPG & Right Popliteal-posterior tibial  . Pr vein bypass graft,aorto-fem-pop     Left Fem-pop BPG    Family History  Problem Relation Age of Onset  . Hyperlipidemia Mother   . Other Mother     AAA  . Diabetes Daughter     History  Substance Use Topics  . Smoking status: Former Smoker -- 1.0 packs/day for 50 years    Types: Cigarettes    Quit date: 01/24/2011  . Smokeless tobacco: Never Used  . Alcohol Use: No    OB History    Grav Para Term Preterm Abortions TAB SAB Ect Mult Living                  Review of Systems  Constitutional: Positive for fever and activity change.  HENT: Negative for congestion, neck pain and postnasal drip.   Respiratory: Positive for cough and shortness of breath.   Cardiovascular: Negative for chest pain.  Gastrointestinal: Negative for nausea, vomiting and abdominal pain.  Genitourinary: Negative for dysuria.  Skin: Negative for rash.  Neurological: Negative for headaches.  Hematological: Does not bruise/bleed easily.    Allergies  Crestor; Lisinopril; and Vicodin  Home Medications   Current Outpatient Rx  Name  Route  Sig  Dispense  Refill  . ACETAMINOPHEN 500 MG PO TABS   Oral   Take 500 mg by mouth every 6 (six) hours as needed. For pain         . ALBUTEROL SULFATE (2.5 MG/3ML) 0.083% IN NEBU   Nebulization   Take 2.5 mg by nebulization 4 (four) times daily.         . ASPIRIN EC 81 MG PO TBEC   Oral   Take 81 mg by mouth daily.         Marland Kitchen CARVEDILOL 25 MG PO TABS   Oral   Take 25 mg by mouth 2 (two) times daily with a meal.         . CLOPIDOGREL BISULFATE 75 MG PO TABS   Oral   Take 75 mg by mouth daily.         . FUROSEMIDE 40 MG PO TABS    Oral   Take 40 mg by mouth daily. May take 1 additional dose for a weight gain of 2 lbs         . INSULIN ASPART 100 UNIT/ML [place] SOLN   Subcutaneous   Inject 2-10 Units into the skin 3 (three) times daily before meals. Sliding scale as directed         . INSULIN GLARGINE 100 UNIT/ML [place] SOLN   Subcutaneous   Inject 22 Units into the skin at bedtime.         . IPRATROPIUM BROMIDE 0.02 % IN SOLN   Nebulization   Take 0.5 mg by nebulization 4 (four) times daily.         . ISOSORBIDE MONONITRATE ER 30 MG PO TB24   Oral   Take 30 mg by mouth daily.         Marland Kitchen LOPERAMIDE HCL 2 MG PO CAPS   Oral   Take 4 mg by mouth 4 (four) times daily as needed. For diarrhea         . LORAZEPAM 0.5 MG PO TABS   Oral   Take 0.5 mg by mouth at bedtime.          Marland Kitchen LOSARTAN POTASSIUM 100 MG PO TABS   Oral   Take 100 mg by mouth daily. Hold if SBP < 110: Hold if HR < 60         . MOXIFLOXACIN HCL 400 MG PO TABS   Oral   Take 1 tablet (400 mg total) by mouth daily.   9 tablet   0   . NITROGLYCERIN 0.4 MG SL SUBL   Sublingual   Place 0.4 mg under the tongue every 5 (five) minutes as needed. For chest pain         . PANTOPRAZOLE SODIUM 40 MG PO TBEC   Oral   Take 40 mg by mouth daily as needed. For heartburn         . SIMVASTATIN 40 MG PO TABS   Oral   Take 40 mg by mouth every evening.          Marland Kitchen TRAMADOL HCL 50 MG PO TABS   Oral   Take 50 mg by mouth every 6 (six) hours as needed. For pain           BP 159/61  Pulse 98  Temp  99.7 F (37.6 C) (Oral)  Resp 22  SpO2 100%  Physical Exam  Nursing note and vitals reviewed. Constitutional: She is oriented to person, place, and time. She appears well-developed and well-nourished.  HENT:  Head: Normocephalic and atraumatic.  Eyes: EOM are normal. Pupils are equal, round, and reactive to light.  Neck: Neck supple. No JVD present.  Cardiovascular: Normal rate, regular rhythm and normal heart sounds.   No murmur  heard. Pulmonary/Chest: Effort normal. No respiratory distress. She has wheezes. She has rales.       Bibasilar rales, mild wheezing  Abdominal: Soft. She exhibits no distension. There is no tenderness. There is no rebound and no guarding.  Musculoskeletal: She exhibits no edema and no tenderness.  Neurological: She is alert and oriented to person, place, and time.  Skin: Skin is warm and dry.    ED Course  Procedures (including critical care time)  Labs Reviewed  CBC WITH DIFFERENTIAL - Abnormal; Notable for the following:    RBC 3.20 (*)     Hemoglobin 9.4 (*)     HCT 28.5 (*)     Neutrophils Relative 85 (*)     Neutro Abs 8.5 (*)     Lymphocytes Relative 3 (*)     Lymphs Abs 0.3 (*)     Monocytes Absolute 1.2 (*)     All other components within normal limits  BASIC METABOLIC PANEL - Abnormal; Notable for the following:    Glucose, Bld 287 (*)     Creatinine, Ser 1.21 (*)     GFR calc non Af Amer 46 (*)     GFR calc Af Amer 53 (*)     All other components within normal limits  PRO B NATRIURETIC PEPTIDE - Abnormal; Notable for the following:    Pro B Natriuretic peptide (BNP) 6325.0 (*)     All other components within normal limits  URINALYSIS, ROUTINE W REFLEX MICROSCOPIC - Abnormal; Notable for the following:    APPearance CLOUDY (*)     Glucose, UA 500 (*)     Hgb urine dipstick TRACE (*)     Protein, ur 30 (*)     Leukocytes, UA TRACE (*)     All other components within normal limits  URINE MICROSCOPIC-ADD ON - Abnormal; Notable for the following:    Squamous Epithelial / LPF MANY (*)     Bacteria, UA FEW (*)     Casts GRANULAR CAST (*)     All other components within normal limits  TROPONIN I   Dg Chest 2 View  10/18/2012  *RADIOLOGY REPORT*  Clinical Data: Chest pain, shortness of breath  CHEST - 2 VIEW  Comparison: 07/13/2012  Findings: Tracheostomy stable.  Previous median sternotomy.  Stable cardiomegaly.  Mild interstitial infiltrates or edema as before. Now  more conspicuous   patchy airspace opacities in both lung bases.  No effusion.  IMPRESSION: 1.  Persistent interstitial edema/infiltrates with new patchy bibasilar airspace opacities.   Original Report Authenticated By: D. Andria Rhein, MD      No diagnosis found.    MDM   Date: 10/19/2012  Rate: 100  Rhythm: sinus tachycardia  QRS Axis: normal  Intervals: normal  ST/T Wave abnormalities: nonspecific ST/T changes  Conduction Disutrbances:none  Narrative Interpretation:   Old EKG Reviewed: unchanged  Pt comes in with cc of cough. She has hx of COPD, CHF. Exam suggestive of CHF more. She admits to missing some of her meds over the past few  days as she is feeling weak and has anorexia.  Clinically, appears to be undifferentiated COPD vs. CHF - but likely she has element of both. With the new cough, and subjective fevers, CXR ordered, and it shows possible infiltrate. No leukocytosis. She is already on AZT, will give her a dose of ceftriaxone. No lasix due to the undifferentiated picture at this time.  Derwood Kaplan, MD 10/19/12 [phone]

## 2012-10-18 NOTE — H&P (Signed)
[facility] History and Physical  Shanita Kanan RUE:[id number] DOB: 06/18/1946 DOA: 10/18/2012  Referring physician: ED PCP: Cala Bradford, MD  Specialists: None  Chief Complaint: Cough  HPI: [name] is a 66 y.o. female with an extensive medical history including systolic CHF, subglottic stenosis s/p trach, who presents to the ED with c/o cough and SOB, cough is productive of a mucopurulent quality sputum.  Symptoms onset 3 days ago and have been persistent.  There is no fever she states (Temp 99.7 in ED) but patient does report some chills today.  She stopped taking her lasix because she felt bad and had reduced PO intake, she feels that she is significantly dry at this time.  In the ED patient had T of 99.7, CXR demonstrated B basilar opacities that were patchy and felt to possibly represent a new PNA.  She had gone to her PCP earlier today and he started her on azithromycin (took first dose today already), she was started on rocephin by the ED and hospitalist asked to admit.  Review of Systems: No body aches, has had flu shot this year, 12 systems reviewed and negative.  Past Medical History  Diagnosis Date  . Diabetes mellitus     on Lantus 30 U   . CAD (coronary artery disease)     S/p CABG and stenting  . PAD (peripheral artery disease)   . Stroke     MRI 11/2011 with remote occipital lobe. MRA with moderate left focal vertebral artery stenosis  . CHF (congestive heart failure) 11/2011    Echo with EF 30-35%, global hypokinesis, and inferior akinesis  . Hyperlipidemia   . DDD (degenerative disc disease), lumbar   . CVA (cerebral vascular accident) 11/2010  . MI (myocardial infarction) 1997  . COPD (chronic obstructive pulmonary disease)   . Anemia   . Chronic kidney disease   . Irregular heart beat   . Hypertension   . GERD (gastroesophageal reflux disease)   . History of IBS   . Hypothyroidism     Goiter  . Thyroid disease   . Carotid artery occlusion     Past Surgical History  Procedure Date  . Ptca   . Thyroidectomy   . Coronary artery bypass graft     2 vessel  . Carotid endarterectomy ~2008    Left   . Cholecystectomy   . Tracheostomy tube placement 01/02/2012  . Angioplasty [id number]    Aortogram by Dr. [place] McKenzie [facility])  . Pr vein bypass graft,aorto-fem-pop     Right common femoral-AK popliteal BPG & Right Popliteal-posterior tibial  . Pr vein bypass graft,aorto-fem-pop     Left Fem-pop BPG   Social History:  reports that she quit smoking about 20 months ago. Her smoking use included Cigarettes. She has a 50 pack-year smoking history. She has never used smokeless tobacco. She reports that she does not drink alcohol or use illicit drugs. Has had flu shot this year.  Allergies  Allergen Reactions  . Crestor (Rosuvastatin Calcium) Other (See Comments)    Muscle Pain  . Lisinopril Other (See Comments)    "Lowers her BP too low" & cough  . Vicodin (Hydrocodone-Acetaminophen) Nausea And Vomiting    Family History  Problem Relation Age of Onset  . Hyperlipidemia Mother   . Other Mother     AAA  . Diabetes Daughter     Prior to Admission medications   Medication Sig Start Date End Date Taking? Authorizing Provider  acetaminophen (TYLENOL) 500  MG tablet Take 500 mg by mouth every 6 (six) hours as needed. For pain   Yes Historical Provider, MD  albuterol (PROVENTIL) (2.5 MG/3ML) 0.083% nebulizer solution Take 2.5 mg by nebulization 4 (four) times daily.   Yes Historical Provider, MD  aspirin EC 81 MG tablet Take 81 mg by mouth daily.   Yes Historical Provider, MD  carvedilol (COREG) 25 MG tablet Take 25 mg by mouth 2 (two) times daily with a meal. 11/28/11  Yes Danley Danker, MD  clopidogrel (PLAVIX) 75 MG tablet Take 75 mg by mouth daily. 11/28/11  Yes Danley Danker, MD  furosemide (LASIX) 40 MG tablet Take 40 mg by mouth daily. May take 1 additional dose for a weight gain of 2 lbs   Yes Historical  Provider, MD  insulin aspart (NOVOLOG) 100 UNIT/ML injection Inject 2-10 Units into the skin 3 (three) times daily before meals. Sliding scale as directed   Yes Historical Provider, MD  insulin glargine (LANTUS) 100 UNIT/ML injection Inject 22 Units into the skin at bedtime.   Yes Historical Provider, MD  ipratropium (ATROVENT) 0.02 % nebulizer solution Take 0.5 mg by nebulization 4 (four) times daily. 11/28/11 11/27/12 Yes Danley Danker, MD  isosorbide mononitrate (IMDUR) 30 MG 24 hr tablet Take 30 mg by mouth daily.   Yes Historical Provider, MD  loperamide (IMODIUM) 2 MG capsule Take 4 mg by mouth 4 (four) times daily as needed. For diarrhea   Yes Historical Provider, MD  LORazepam (ATIVAN) 0.5 MG tablet Take 0.5 mg by mouth at bedtime.    Yes Historical Provider, MD  losartan (COZAAR) 100 MG tablet Take 100 mg by mouth daily. Hold if SBP < 110: Hold if HR < 60 12/03/11 12/02/12 Yes Leodis Sias, MD  moxifloxacin (AVELOX) 400 MG tablet Take 1 tablet (400 mg total) by mouth daily. 07/13/12  Yes Flint Melter, MD  nitroGLYCERIN (NITROSTAT) 0.4 MG SL tablet Place 0.4 mg under the tongue every 5 (five) minutes as needed. For chest pain   Yes Historical Provider, MD  pantoprazole (PROTONIX) 40 MG tablet Take 40 mg by mouth daily as needed. For heartburn 01/09/12 01/08/13 Yes Christia Reading, MD  simvastatin (ZOCOR) 40 MG tablet Take 40 mg by mouth every evening.  11/28/11  Yes Danley Danker, MD  traMADol (ULTRAM) 50 MG tablet Take 50 mg by mouth every 6 (six) hours as needed. For pain   Yes Historical Provider, MD   Physical Exam: Filed Vitals:   10/18/12 2020 10/18/12 2100 10/18/12 2200 10/18/12 2352  BP: 169/73 159/61  140/49  Pulse: 102 102 98 94  Temp: 99.7 F (37.6 C)     TempSrc: Oral     Resp:   22 20  SpO2: 100% 100% 100% 94%    General:  NAD, resting comfortably in bed Eyes: PEERLA EOMI ENT: mucous membranes moist Neck: supple w/o JVD, trach in place Cardiovascular: RRR w/o  MRG Respiratory: crackles at bases bilaterally, and especially in the L fields. Abdomen: soft, nt, nd, bs+ Skin: no rash nor lesion Musculoskeletal: MAE, full ROM all 4 extremities Psychiatric: normal tone and affect Neurologic: AAOx3, grossly non-focal  Labs on Admission:  Basic Metabolic Panel:  Lab 10/18/12 0981  NA 139  K 3.6  CL 102  CO2 26  GLUCOSE 287*  BUN 20  CREATININE 1.21*  CALCIUM 8.7  MG --  PHOS --   Liver Function Tests: No results found for this basename: AST:5,ALT:5,ALKPHOS:5,BILITOT:5,PROT:5,ALBUMIN:5 in the last  168 hours No results found for this basename: LIPASE:5,AMYLASE:5 in the last 168 hours No results found for this basename: AMMONIA:5 in the last 168 hours CBC:  Lab 10/18/12 2143  WBC 10.1  NEUTROABS 8.5*  HGB 9.4*  HCT 28.5*  MCV 89.1  PLT 156   Cardiac Enzymes:  Lab 10/18/12 2145  CKTOTAL --  CKMB --  CKMBINDEX --  TROPONINI <0.30    BNP (last 3 results)  Basename 10/18/12 2145 12/20/11 2111 12/18/11 1142  PROBNP 6325.0* 853.8* 7071.0*   CBG: No results found for this basename: GLUCAP:5 in the last 168 hours  Radiological Exams on Admission: Dg Chest 2 View  10/18/2012  *RADIOLOGY REPORT*  Clinical Data: Chest pain, shortness of breath  CHEST - 2 VIEW  Comparison: 07/13/2012  Findings: Tracheostomy stable.  Previous median sternotomy.  Stable cardiomegaly.  Mild interstitial infiltrates or edema as before. Now more conspicuous   patchy airspace opacities in both lung bases.  No effusion.  IMPRESSION: 1.  Persistent interstitial edema/infiltrates with new patchy bibasilar airspace opacities.   Original Report Authenticated By: D. Andria Rhein, MD     EKG: Independently reviewed. Lateral ST depressions are old and seen in feb ekg, repol abnormality also old.    Assessment/Plan Principal Problem:  *CAP (community acquired pneumonia) Active Problems:  Chronic systolic heart failure  Diabetes mellitus   1. CAP - starting  cap treatment with rocephin, will continue azithromycin with tomorrows dose.  Sputum and blood culture ordered.  Does not appear severe at this point. 2. Chronic systolic CHF - felt less likely that this is an acute exacerbation given the patients feeling that she is dry.  Her leg edema she typically gets when fluid overloaded is absent entirely.  While her volume status is not perfectly clear, do feel that it is reasonable to try her approach, hold off on lasix, treat pneumonia, gentle hydration, and see how she does, the risk of this of course is fluid overload (as I explained to patient and daughter), in which case patient should inform nursing that her breathing is getting worse so we can re-evaluate and reverse course (stop fluids and give lasix). 3. DM2 - will continue home insulin, CBG checks.    Code Status: Full Code (must indicate code status--if unknown or must be presumed, indicate so) Family Communication: Spoke with daughter at bedside. (indicate person spoken with, if applicable, with phone number if by telephone) Disposition Plan: Admit to obs (indicate anticipated LOS)  Time spent: 70 min  Shaquanda Graves M. [facility] Pager [phone]  If 7PM-7AM, please contact night-coverage [email or web address] Password [facility] 10/18/2012, 11:55 PM

## 2012-10-18 NOTE — ED Notes (Signed)
Respiratory at bedside to suction pt's trach

## 2012-10-18 NOTE — ED Notes (Signed)
MD at bedside. 

## 2012-10-18 NOTE — ED Notes (Signed)
Pt ambulated to the restroom unassisted 

## 2012-10-18 NOTE — ED Notes (Signed)
Patient transported to X-ray 

## 2012-10-18 NOTE — ED Notes (Signed)
Pt Dx with bronchitis today. Patient has trach tube. Pt reports mild respiratory distress. Congestion in left fields.  Patient's daughter gave a breathing treatment at home for wheezing.  Started po antibiotics today.

## 2012-10-19 DIAGNOSIS — E119 Type 2 diabetes mellitus without complications: Secondary | ICD-10-CM

## 2012-10-19 DIAGNOSIS — J189 Pneumonia, unspecified organism: Principal | ICD-10-CM

## 2012-10-19 DIAGNOSIS — I5022 Chronic systolic (congestive) heart failure: Secondary | ICD-10-CM

## 2012-10-19 LAB — CBC WITH DIFFERENTIAL/PLATELET
Basophils Absolute: 0 10*3/uL (ref 0.0–0.1)
Basophils Relative: 0 % (ref 0–1)
Eosinophils Absolute: 0 10*3/uL (ref 0.0–0.7)
Hemoglobin: 8.7 g/dL — ABNORMAL LOW (ref 12.0–15.0)
MCH: 29.2 pg (ref 26.0–34.0)
MCHC: 32.3 g/dL (ref 30.0–36.0)
Monocytes Relative: 12 % (ref 3–12)
Neutrophils Relative %: 79 % — ABNORMAL HIGH (ref 43–77)
RDW: 15.5 % (ref 11.5–15.5)

## 2012-10-19 LAB — TROPONIN I: Troponin I: <0.3 ng/mL (ref <0.30)

## 2012-10-19 LAB — CREATININE, SERUM
Creatinine, Ser: 1.24 mg/dL — ABNORMAL HIGH (ref 0.50–1.10)
GFR calc non Af Amer: 44 mL/min — ABNORMAL LOW (ref >90)

## 2012-10-19 LAB — LEGIONELLA ANTIGEN, URINE: Legionella Antigen, Urine: NEGATIVE

## 2012-10-19 LAB — BASIC METABOLIC PANEL
BUN: 20 mg/dL (ref 6–23)
Creatinine, Ser: 1.3 mg/dL — ABNORMAL HIGH (ref 0.50–1.10)
GFR calc Af Amer: 48 mL/min — ABNORMAL LOW (ref >90)
GFR calc non Af Amer: 42 mL/min — ABNORMAL LOW (ref >90)
Potassium: 3.4 mEq/L — ABNORMAL LOW (ref 3.5–5.1)

## 2012-10-19 LAB — GLUCOSE, CAPILLARY

## 2012-10-19 LAB — STREP PNEUMONIAE URINARY ANTIGEN: Strep Pneumo Urinary Antigen: NEGATIVE

## 2012-10-19 LAB — CBC
HCT: 27.5 % — ABNORMAL LOW (ref 36.0–46.0)
MCHC: 33.1 g/dL (ref 30.0–36.0)
Platelets: 167 10*3/uL (ref 150–400)
RDW: 15.5 % (ref 11.5–15.5)
WBC: 9.7 10*3/uL (ref 4.0–10.5)

## 2012-10-19 MED ORDER — AZITHROMYCIN 500 MG PO TABS
500.0000 mg | ORAL_TABLET | ORAL | Status: DC
  Administered 2012-10-20 – 2012-10-23 (×4): 500 mg via ORAL
  Filled 2012-10-19 (×6): qty 1

## 2012-10-19 MED ORDER — CLOPIDOGREL BISULFATE 75 MG PO TABS
75.0000 mg | ORAL_TABLET | Freq: Every day | ORAL | Status: DC
  Administered 2012-10-19 – 2012-10-23 (×5): 75 mg via ORAL
  Filled 2012-10-19 (×6): qty 1

## 2012-10-19 MED ORDER — AZITHROMYCIN 500 MG PO TABS
500.0000 mg | ORAL_TABLET | Freq: Once | ORAL | Status: AC
  Administered 2012-10-19: 500 mg via ORAL
  Filled 2012-10-19: qty 1

## 2012-10-19 MED ORDER — LOSARTAN POTASSIUM 50 MG PO TABS
100.0000 mg | ORAL_TABLET | Freq: Every day | ORAL | Status: DC
  Administered 2012-10-19 – 2012-10-23 (×5): 100 mg via ORAL
  Filled 2012-10-19 (×5): qty 2

## 2012-10-19 MED ORDER — FUROSEMIDE 40 MG PO TABS
40.0000 mg | ORAL_TABLET | Freq: Every day | ORAL | Status: DC
  Administered 2012-10-19 – 2012-10-23 (×5): 40 mg via ORAL
  Filled 2012-10-19 (×6): qty 1

## 2012-10-19 MED ORDER — INSULIN ASPART 100 UNIT/ML ~~LOC~~ SOLN
0.0000 [IU] | Freq: Three times a day (TID) | SUBCUTANEOUS | Status: DC
  Administered 2012-10-19 – 2012-10-22 (×8): 2 [IU] via SUBCUTANEOUS
  Administered 2012-10-22 – 2012-10-23 (×2): 1 [IU] via SUBCUTANEOUS
  Administered 2012-10-23: 3 [IU] via SUBCUTANEOUS
  Administered 2012-10-23: 5 [IU] via SUBCUTANEOUS

## 2012-10-19 NOTE — Progress Notes (Signed)
PATIENT DETAILS Name: [name] Age: 66 y.o. Sex: female Date of Birth: [birth year] Admit Date: 10/18/2012 Admitting Physician Hillary Bow, DO ZOX:WRUEA,VWUJWJX S, MD  Subjective: Admitted with Fever, SOB and cough  Assessment/Plan: Principal Problem:  *CAP (community acquired pneumonia) -febrile overnight -does not appear toxic -c/w Rocephin and Zithromax -blood cultures 12/27-pending  Active Problems:  Chronic systolic heart failure -stop IVF -resume lasix -clinically compensated -c/w Coreg and Losartan -last Echo-Feb 2013-EF 30-35%   Diabetes mellitus -CBG's controlled -c/w Lantus and SSI  HTN -controlled -c/w Coreg, Losartan and Imdur  H/o CAD/CVA -on ASA,Plavix, Coreg and Statins  Dyslipidemia -statins  COPD -stable -c/w nebs  Subglottic Stenosis -chronic Trach in place -trach care per RT  Disposition: Remain inpatient  DVT Prophylaxis: Prophylactic Heparin  Code Status: Full code   Procedures:  None  CONSULTS:  None  PHYSICAL EXAM: Vital signs in last 24 hours: Filed Vitals:   10/19/12 0500 10/19/12 0652 10/19/12 0839 10/19/12 0841  BP: 135/41     Pulse: 86  87   Temp: 102 F (38.9 C) 99 F (37.2 C)    TempSrc: Oral     Resp: 24  20   Height:      Weight:      SpO2: 98%  100% 100%    Weight change:  Body mass index is 28.59 kg/(m^2).   Gen Exam: Awake and alert with clear speech.   Neck: Supple, No JVD.  Trach in place Chest: B/L Clear.   CVS: S1 S2 Regular, no murmurs.  Abdomen: soft, BS +, non tender, non distended.  Extremities: no edema, lower extremities warm to touch. Neurologic: Non Focal.   Skin: No Rash.   Wounds: N/A.    Intake/Output from previous day:  Intake/Output Summary (Last 24 hours) at 10/19/12 1135 Last data filed at 10/19/12 0500  Gross per 24 hour  Intake  352.5 ml  Output      0 ml  Net  352.5 ml     LAB RESULTS: CBC  Lab 10/19/12 0629 10/18/12 2352 10/18/12 2143  WBC  10.5 9.7 10.1  HGB 8.7* 9.1* 9.4*  HCT 26.9* 27.5* 28.5*  PLT 147* 167 156  MCV 90.3 89.0 89.1  MCH 29.2 29.4 29.4  MCHC 32.3 33.1 33.0  RDW 15.5 15.5 15.4  LYMPHSABS 0.9 -- 0.3*  MONOABS 1.3* -- 1.2*  EOSABS 0.0 -- 0.0  BASOSABS 0.0 -- 0.0  BANDABS -- -- --    Chemistries   Lab 10/19/12 0629 10/18/12 2352 10/18/12 2143  NA 141 -- 139  K 3.4* -- 3.6  CL 104 -- 102  CO2 27 -- 26  GLUCOSE 140* -- 287*  BUN 20 -- 20  CREATININE 1.30* 1.24* 1.21*  CALCIUM 8.5 -- 8.7  MG -- -- --    CBG:  Lab 10/19/12 0752  GLUCAP 123*    GFR Estimated Creatinine Clearance: 39.2 ml/min (by C-G formula based on Cr of 1.3).  Coagulation profile No results found for this basename: INR:5,PROTIME:5 in the last 168 hours  Cardiac Enzymes  Lab 10/19/12 0626 10/19/12 0013 10/18/12 2145  CKMB -- -- --  TROPONINI <0.30 <0.30 <0.30  MYOGLOBIN -- -- --    No components found with this basename: POCBNP:3 No results found for this basename: DDIMER:2 in the last 72 hours No results found for this basename: HGBA1C:2 in the last 72 hours No results found for this basename: CHOL:2,HDL:2,LDLCALC:2,TRIG:2,CHOLHDL:2,LDLDIRECT:2 in the last 72 hours No results found for this basename:  TSH,T4TOTAL,FREET3,T3FREE,THYROIDAB in the last 72 hours No results found for this basename: VITAMINB12:2,FOLATE:2,FERRITIN:2,TIBC:2,IRON:2,RETICCTPCT:2 in the last 72 hours No results found for this basename: LIPASE:2,AMYLASE:2 in the last 72 hours  Urine Studies No results found for this basename: UACOL:2,UAPR:2,USPG:2,UPH:2,UTP:2,UGL:2,UKET:2,UBIL:2,UHGB:2,UNIT:2,UROB:2,ULEU:2,UEPI:2,UWBC:2,URBC:2,UBAC:2,CAST:2,CRYS:2,UCOM:2,BILUA:2 in the last 72 hours  MICROBIOLOGY: No results found for this or any previous visit (from the past 240 hour(s)).  RADIOLOGY STUDIES/RESULTS: Dg Chest 2 View  10/18/2012  *RADIOLOGY REPORT*  Clinical Data: Chest pain, shortness of breath  CHEST - 2 VIEW  Comparison: 07/13/2012   Findings: Tracheostomy stable.  Previous median sternotomy.  Stable cardiomegaly.  Mild interstitial infiltrates or edema as before. Now more conspicuous   patchy airspace opacities in both lung bases.  No effusion.  IMPRESSION: 1.  Persistent interstitial edema/infiltrates with new patchy bibasilar airspace opacities.   Original Report Authenticated By: D. Andria Rhein, MD     MEDICATIONS: Scheduled Meds:   . aspirin EC  81 mg Oral Daily  . azithromycin  500 mg Oral Q24H  . carvedilol  25 mg Oral BID WC  . cefTRIAXone (ROCEPHIN)  IV  1 g Intravenous Q24H  . clopidogrel  75 mg Oral Q breakfast  . heparin  5,000 Units Subcutaneous Q8H  . insulin aspart  0-9 Units Subcutaneous TID WC  . insulin glargine  22 Units Subcutaneous QHS  . ipratropium  0.5 mg Nebulization QID  . isosorbide mononitrate  30 mg Oral Daily  . LORazepam  0.5 mg Oral QHS  . losartan  100 mg Oral Daily  . simvastatin  40 mg Oral QPM   Continuous Infusions:  PRN Meds:.acetaminophen, albuterol, pantoprazole, traMADol  Antibiotics: Anti-infectives     Start     Dose/Rate Route Frequency Ordered Stop   10/20/12 0900   azithromycin (ZITHROMAX) tablet 500 mg        500 mg Oral Every 24 hours 10/19/12 0102 10/26/12 0859   10/19/12 2315   cefTRIAXone (ROCEPHIN) 1 g in dextrose 5 % 50 mL IVPB        1 g 100 mL/hr over 30 Minutes Intravenous Every 24 hours 10/18/12 2353 10/26/12 2314   10/19/12 0900   azithromycin (ZITHROMAX) tablet 500 mg  Status:  Discontinued        500 mg Oral Every 24 hours 10/18/12 2353 10/19/12 0103   10/19/12 0130   azithromycin (ZITHROMAX) tablet 500 mg        500 mg Oral  Once 10/19/12 0102 10/19/12 0213   10/18/12 2315   cefTRIAXone (ROCEPHIN) 1 g in dextrose 5 % 50 mL IVPB        1 g 100 mL/hr over 30 Minutes Intravenous  Once 10/18/12 2302 10/19/12 0018           Jeoffrey Massed, MD  [facility] Pager:[phone]  If 7PM-7AM, please contact  night-coverage [email or web address] Password [email or web address] 10/19/2012, 11:35 AM   LOS: 1 day

## 2012-10-19 NOTE — Progress Notes (Signed)
10/19/12 0152  Clinical Encounter Type  Visited With Patient  Visit Type Initial  Advance Directives (For Healthcare)  Advance Directive Patient does not have advance directive;Patient would not like information  Pre-existing out of facility DNR order (yellow form or pink MOST form) No   Provided emotional support for the patient. Brought her a prayer blanket when she mentioned she was cold.  Veryl Speak

## 2012-10-20 DIAGNOSIS — I10 Essential (primary) hypertension: Secondary | ICD-10-CM

## 2012-10-20 DIAGNOSIS — D649 Anemia, unspecified: Secondary | ICD-10-CM

## 2012-10-20 LAB — CBC
Hemoglobin: 7.8 g/dL — ABNORMAL LOW (ref 12.0–15.0)
MCH: 28.7 pg (ref 26.0–34.0)
MCV: 91.2 fL (ref 78.0–100.0)
RBC: 2.72 MIL/uL — ABNORMAL LOW (ref 3.87–5.11)

## 2012-10-20 LAB — IRON AND TIBC

## 2012-10-20 LAB — BASIC METABOLIC PANEL
CO2: 27 mEq/L (ref 19–32)
Calcium: 8.5 mg/dL (ref 8.4–10.5)
Creatinine, Ser: 1.19 mg/dL — ABNORMAL HIGH (ref 0.50–1.10)
Glucose, Bld: 67 mg/dL — ABNORMAL LOW (ref 70–99)
Sodium: 142 mEq/L (ref 135–145)

## 2012-10-20 LAB — FOLATE: Folate: >20 ng/mL

## 2012-10-20 LAB — RETICULOCYTES: RBC.: 2.86 MIL/uL — ABNORMAL LOW (ref 3.87–5.11)

## 2012-10-20 LAB — GLUCOSE, CAPILLARY
Glucose-Capillary: 56 mg/dL — ABNORMAL LOW (ref 70–99)
Glucose-Capillary: 109 mg/dL — ABNORMAL HIGH (ref 70–99)
Glucose-Capillary: 156 mg/dL — ABNORMAL HIGH (ref 70–99)
Glucose-Capillary: 197 mg/dL — ABNORMAL HIGH (ref 70–99)

## 2012-10-20 LAB — VITAMIN B12: Vitamin B-12: 762 pg/mL (ref 211–911)

## 2012-10-20 MED ORDER — POTASSIUM CHLORIDE CRYS ER 20 MEQ PO TBCR: 20.0000 meq | EXTENDED_RELEASE_TABLET | Freq: Two times a day (BID) | ORAL | Status: DC

## 2012-10-20 MED ORDER — POTASSIUM CHLORIDE CRYS ER 20 MEQ PO TBCR
40.0000 meq | EXTENDED_RELEASE_TABLET | Freq: Every day | ORAL | Status: DC
  Administered 2012-10-20 – 2012-10-23 (×4): 40 meq via ORAL
  Filled 2012-10-20 (×4): qty 2

## 2012-10-20 NOTE — Progress Notes (Signed)
PATIENT DETAILS Name: [name] Age: 66 y.o. Sex: female Date of Birth: 05-19-1946 Admit Date: 10/18/2012 Admitting Physician Hillary Bow, DO ZOX:WRUEA,VWUJWJX S, MD  Subjective: Admitted with Fever, SOB and cough. She does not feel particularly improved from yesterday. There is no worsening of her condition, however.  Assessment/Plan: Principal Problem:  *CAP (community acquired pneumonia) No fever in the last 24 hours. -does not appear toxic -c/w Rocephin and Zithromax -blood cultures 12/27-pending  Active Problems:  Chronic systolic heart failure -Continue with oral Lasix. Replete potassium. -clinically compensated -c/w Coreg and Losartan -last Echo-Feb 2013-EF 30-35%   Diabetes mellitus -CBG's controlled -c/w Lantus and SSI  HTN -controlled -c/w Coreg, Losartan and Imdur  H/o CAD/CVA -on ASA,Plavix, Coreg and Statins  Dyslipidemia -statins  COPD -stable -c/w nebs  Subglottic Stenosis -chronic Trach in place -trach care per RT  Anemia -Check anemia panel. Her hemoglobin has been trending down, no obvious sign of bleeding.  Disposition: Remain inpatient  DVT Prophylaxis: Prophylactic Heparin  Code Status: Full code   Procedures:  None  CONSULTS:  None  PHYSICAL EXAM: Vital signs in last 24 hours: Filed Vitals:   10/19/12 2123 10/20/12 0019 10/20/12 0353 10/20/12 0500  BP:      Pulse: 74 76 80   Temp:      TempSrc:      Resp: 18 18 18    Height:      Weight:      SpO2:    99%    Weight change:  Body mass index is 28.59 kg/(m^2).   Gen Exam: Awake and alert with clear speech.  She is not clinically toxic or septic. Neck: Supple, No JVD.  Trach in place Chest: B/L Clear.   CVS: S1 S2 Regular, no murmurs.  Abdomen: soft, BS +, non tender, non distended.  Extremities: no edema, lower extremities warm to touch. Neurologic: Non Focal.   Skin: No Rash.   Wounds: N/A.    Intake/Output from previous day:  Intake/Output  Summary (Last 24 hours) at 10/20/12 0722 Last data filed at 10/19/12 1213  Gross per 24 hour  Intake 541.25 ml  Output      0 ml  Net 541.25 ml     LAB RESULTS: CBC  Lab 10/20/12 0502 10/19/12 0629 10/18/12 2352 10/18/12 2143  WBC 8.9 10.5 9.7 10.1  HGB 7.8* 8.7* 9.1* 9.4*  HCT 24.8* 26.9* 27.5* 28.5*  PLT 157 147* 167 156  MCV 91.2 90.3 89.0 89.1  MCH 28.7 29.2 29.4 29.4  MCHC 31.5 32.3 33.1 33.0  RDW 15.7* 15.5 15.5 15.4  LYMPHSABS -- 0.9 -- 0.3*  MONOABS -- 1.3* -- 1.2*  EOSABS -- 0.0 -- 0.0  BASOSABS -- 0.0 -- 0.0  BANDABS -- -- -- --    Chemistries   Lab 10/20/12 0502 10/19/12 0629 10/18/12 2352 10/18/12 2143  NA 142 141 -- 139  K 3.3* 3.4* -- 3.6  CL 106 104 -- 102  CO2 27 27 -- 26  GLUCOSE 67* 140* -- 287*  BUN 18 20 -- 20  CREATININE 1.19* 1.30* 1.24* 1.21*  CALCIUM 8.5 8.5 -- 8.7  MG -- -- -- --    CBG:  Lab 10/19/12 2118 10/19/12 1715 10/19/12 1219 10/19/12 0752  GLUCAP 95 176* 158* 123*    GFR Estimated Creatinine Clearance: 42.9 ml/min (by C-G formula based on Cr of 1.19).  Coagulation profile No results found for this basename: INR:5,PROTIME:5 in the last 168 hours  Cardiac Enzymes  Lab 10/19/12  1200 10/19/12 0626 10/19/12 0013  CKMB -- -- --  TROPONINI <0.30 <0.30 <0.30  MYOGLOBIN -- -- --    No components found with this basename: POCBNP:3 No results found for this basename: DDIMER:2 in the last 72 hours No results found for this basename: HGBA1C:2 in the last 72 hours No results found for this basename: CHOL:2,HDL:2,LDLCALC:2,TRIG:2,CHOLHDL:2,LDLDIRECT:2 in the last 72 hours No results found for this basename: TSH,T4TOTAL,FREET3,T3FREE,THYROIDAB in the last 72 hours No results found for this basename: VITAMINB12:2,FOLATE:2,FERRITIN:2,TIBC:2,IRON:2,RETICCTPCT:2 in the last 72 hours No results found for this basename: LIPASE:2,AMYLASE:2 in the last 72 hours  Urine Studies No results found for this basename:  UACOL:2,UAPR:2,USPG:2,UPH:2,UTP:2,UGL:2,UKET:2,UBIL:2,UHGB:2,UNIT:2,UROB:2,ULEU:2,UEPI:2,UWBC:2,URBC:2,UBAC:2,CAST:2,CRYS:2,UCOM:2,BILUA:2 in the last 72 hours  MICROBIOLOGY: Recent Results (from the past 240 hour(s))  CULTURE, RESPIRATORY     Status: Normal (Preliminary result)   Collection Time   10/19/12 12:14 PM      Component Value Range Status Comment   Specimen Description TRACHEAL ASPIRATE   Final    Special Requests NONE   Final    Gram Stain     Final    Value: FEW WBC PRESENT, PREDOMINANTLY PMN     NO SQUAMOUS EPITHELIAL CELLS SEEN     RARE GRAM NEGATIVE RODS     RARE GRAM POSITIVE RODS     RARE GRAM POSITIVE COCCI IN PAIRS   Culture PENDING   Incomplete    Report Status PENDING   Incomplete     RADIOLOGY STUDIES/RESULTS: Dg Chest 2 View  10/18/2012  *RADIOLOGY REPORT*  Clinical Data: Chest pain, shortness of breath  CHEST - 2 VIEW  Comparison: 07/13/2012  Findings: Tracheostomy stable.  Previous median sternotomy.  Stable cardiomegaly.  Mild interstitial infiltrates or edema as before. Now more conspicuous   patchy airspace opacities in both lung bases.  No effusion.  IMPRESSION: 1.  Persistent interstitial edema/infiltrates with new patchy bibasilar airspace opacities.   Original Report Authenticated By: D. Andria Rhein, MD     MEDICATIONS: Scheduled Meds:    . aspirin EC  81 mg Oral Daily  . azithromycin  500 mg Oral Q24H  . carvedilol  25 mg Oral BID WC  . cefTRIAXone (ROCEPHIN)  IV  1 g Intravenous Q24H  . clopidogrel  75 mg Oral Q breakfast  . furosemide  40 mg Oral Daily  . heparin  5,000 Units Subcutaneous Q8H  . insulin aspart  0-9 Units Subcutaneous TID WC  . insulin glargine  22 Units Subcutaneous QHS  . ipratropium  0.5 mg Nebulization QID  . isosorbide mononitrate  30 mg Oral Daily  . LORazepam  0.5 mg Oral QHS  . losartan  100 mg Oral Daily  . potassium chloride  40 mEq Oral Daily  . simvastatin  40 mg Oral QPM   Continuous Infusions:  PRN  Meds:.acetaminophen, albuterol, pantoprazole, traMADol  Antibiotics: Anti-infectives     Start     Dose/Rate Route Frequency Ordered Stop   10/20/12 0900   azithromycin (ZITHROMAX) tablet 500 mg        500 mg Oral Every 24 hours 10/19/12 0102 10/26/12 0859   10/19/12 2315   cefTRIAXone (ROCEPHIN) 1 g in dextrose 5 % 50 mL IVPB        1 g 100 mL/hr over 30 Minutes Intravenous Every 24 hours 10/18/12 2353 10/26/12 2314   10/19/12 0900   azithromycin (ZITHROMAX) tablet 500 mg  Status:  Discontinued        500 mg Oral Every 24 hours 10/18/12 2353 10/19/12 0103  10/19/12 0130   azithromycin (ZITHROMAX) tablet 500 mg        500 mg Oral  Once 10/19/12 0102 10/19/12 0213   10/18/12 2315   cefTRIAXone (ROCEPHIN) 1 g in dextrose 5 % 50 mL IVPB        1 g 100 mL/hr over 30 Minutes Intravenous  Once 10/18/12 2302 10/19/12 0018           Wilson Singer, MD  [facility] Pager:[phone]  If 7PM-7AM, please contact night-coverage [email or web address] Password [email or web address] 10/20/2012, 7:22 AM   LOS: 2 days

## 2012-10-20 NOTE — Progress Notes (Signed)
Per MD will put in an order for pt to get 20 MEQ of potassium po BID

## 2012-10-20 NOTE — Progress Notes (Signed)
Pt has a low potassium of 3.3. RN paged MD on call awaiting further orders. RN will continue to monitor pt. -Judy Pimple, RN

## 2012-10-20 NOTE — Progress Notes (Signed)
Pt asked to be suctioned. Pt needed to be deep suctioned and requested for respiratory to suction her.

## 2012-10-21 DIAGNOSIS — D649 Anemia, unspecified: Secondary | ICD-10-CM

## 2012-10-21 DIAGNOSIS — N179 Acute kidney failure, unspecified: Secondary | ICD-10-CM

## 2012-10-21 LAB — CBC
Hemoglobin: 8 g/dL — ABNORMAL LOW (ref 12.0–15.0)
MCHC: 31.4 g/dL (ref 30.0–36.0)
Platelets: 166 10*3/uL (ref 150–400)
RDW: 15.5 % (ref 11.5–15.5)

## 2012-10-21 LAB — COMPREHENSIVE METABOLIC PANEL
ALT: 24 U/L (ref 0–35)
AST: 18 U/L (ref 0–37)
Albumin: 2.9 g/dL — ABNORMAL LOW (ref 3.5–5.2)
Alkaline Phosphatase: 156 U/L — ABNORMAL HIGH (ref 39–117)
Glucose, Bld: 120 mg/dL — ABNORMAL HIGH (ref 70–99)
Potassium: 4.1 mEq/L (ref 3.5–5.1)
Sodium: 145 mEq/L (ref 135–145)
Total Protein: 6.8 g/dL (ref 6.0–8.3)

## 2012-10-21 LAB — GLUCOSE, CAPILLARY
Glucose-Capillary: 112 mg/dL — ABNORMAL HIGH (ref 70–99)
Glucose-Capillary: 167 mg/dL — ABNORMAL HIGH (ref 70–99)
Glucose-Capillary: 183 mg/dL — ABNORMAL HIGH (ref 70–99)
Glucose-Capillary: 215 mg/dL — ABNORMAL HIGH (ref 70–99)

## 2012-10-21 MED ORDER — GUAIFENESIN ER 600 MG PO TB12
600.0000 mg | ORAL_TABLET | Freq: Two times a day (BID) | ORAL | Status: DC | PRN
  Administered 2012-10-21: 600 mg via ORAL
  Filled 2012-10-21 (×2): qty 1

## 2012-10-21 MED ORDER — FERROUS FUMARATE 325 (106 FE) MG PO TABS
1.0000 | ORAL_TABLET | Freq: Two times a day (BID) | ORAL | Status: DC
  Administered 2012-10-21 – 2012-10-23 (×5): 106 mg via ORAL
  Filled 2012-10-21 (×7): qty 1

## 2012-10-21 NOTE — Progress Notes (Signed)
[facility] PROGRESS NOTE  Zana Biancardi ZOX:[id number] DOB: [birth year] DOA: 10/18/2012 PCP: Cala Bradford, MD    Assessment/Plan:    *CAP (community acquired pneumonia)  No fever in the last 24 hours.  -does not appear toxic  -c/w Rocephin and Zithromax  -blood cultures 12/27-pending  Very well could be viral- family member had similar symptoms  Chronic systolic heart failure  -Continue with oral Lasix. Replete potassium.  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  -?fe def anemia- add Fe -outpatient colonoscopy    Code Status: full Family Communication:  Disposition Plan: home 1-2 days   Consultants:    Procedures:    Antibiotics:    HPI/Subjective: Still not feeling well  no CP Eating ok   Objective: Filed Vitals:   10/21/12 0334 10/21/12 0521 10/21/12 0650 10/21/12 0741  BP:  166/72    Pulse:  85  82  Temp:  98.4 F (36.9 C)    TempSrc:  Oral    Resp:  20  20  Height:      Weight:      SpO2: 98% 98% 98% 99%    Intake/Output Summary (Last 24 hours) at 10/21/12 0754 Last data filed at 10/20/12 1800  Gross per 24 hour  Intake   1200 ml  Output      0 ml  Net   1200 ml   Filed Weights   10/19/12 0105  Weight: 70.9 kg (156 lb 4.9 oz)    Exam:  Gen Exam: Awake and alert with clear speech. She is not clinically toxic or septic.  Neck: Supple, No JVD. Trach in place  Chest: B/L Clear.  CVS: S1 S2 Regular, no murmurs.  Abdomen: soft, BS +, non tender, non distended.  Extremities: no edema, lower extremities warm to touch.    Neurologic: Non Focal.  Skin: No Rash.    Data Reviewed: Basic Metabolic Panel:  Lab 10/20/12 8119 10/19/12 0629 10/18/12 2352 10/18/12 2143  NA 142 141 -- 139  K 3.3* 3.4* -- 3.6  CL 106 104 -- 102  CO2 27 27 -- 26  GLUCOSE 67* 140* -- 287*  BUN 18 20 -- 20  CREATININE 1.19* 1.30* 1.24* 1.21*  CALCIUM 8.5 8.5 -- 8.7  MG -- -- -- --  PHOS -- -- -- --   Liver Function Tests: No results found for this basename: AST:5,ALT:5,ALKPHOS:5,BILITOT:5,PROT:5,ALBUMIN:5 in the last 168 hours No results found for this basename: LIPASE:5,AMYLASE:5 in the last 168 hours No results found for this basename: AMMONIA:5 in the last 168 hours CBC:  Lab 10/20/12 0502 10/19/12 0629 10/18/12 2352 10/18/12 2143  WBC 8.9 10.5 9.7 10.1  NEUTROABS -- 8.4* -- 8.5*  HGB 7.8* 8.7* 9.1* 9.4*  HCT 24.8* 26.9* 27.5* 28.5*  MCV 91.2 90.3 89.0 89.1  PLT 157 147* 167 156   Cardiac Enzymes:  Lab 10/19/12 1200 10/19/12 0626 10/19/12 0013 10/18/12 2145  CKTOTAL -- -- -- --  CKMB -- -- -- --  CKMBINDEX -- -- -- --  TROPONINI <0.30 <0.30 <  0.30 <0.30   BNP (last 3 results)  Basename 10/18/12 2145 12/20/11 2111 12/18/11 1142  PROBNP 6325.0* 853.8* 7071.0*   CBG:  Lab 10/21/12 0749 10/20/12 2206 10/20/12 1729 10/20/12 1150 10/20/12 0846  GLUCAP 112* 197* 175* 156* 109*    Recent Results (from the past 240 hour(s))  CULTURE, BLOOD (ROUTINE X 2)     Status: Normal (Preliminary result)   Collection Time   10/19/12 12:00 AM      Component Value Range Status Comment   Specimen Description BLOOD LEFT ARM   Final    Special Requests BOTTLES DRAWN AEROBIC AND ANAEROBIC 10CC   Final    Culture  Setup Time 10/19/2012 04:07   Final    Culture     Final    Value:        BLOOD CULTURE RECEIVED NO GROWTH TO DATE CULTURE WILL BE HELD FOR 5 DAYS BEFORE ISSUING A FINAL NEGATIVE REPORT   Report Status PENDING   Incomplete   CULTURE, BLOOD (ROUTINE X 2)     Status: Normal (Preliminary result)   Collection Time    10/19/12 12:10 AM      Component Value Range Status Comment   Specimen Description BLOOD LEFT HAND   Final    Special Requests BOTTLES DRAWN AEROBIC AND ANAEROBIC 10CC   Final    Culture  Setup Time 10/19/2012 04:07   Final    Culture     Final    Value:        BLOOD CULTURE RECEIVED NO GROWTH TO DATE CULTURE WILL BE HELD FOR 5 DAYS BEFORE ISSUING A FINAL NEGATIVE REPORT   Report Status PENDING   Incomplete   CULTURE, RESPIRATORY     Status: Normal (Preliminary result)   Collection Time   10/19/12 12:14 PM      Component Value Range Status Comment   Specimen Description TRACHEAL ASPIRATE   Final    Special Requests NONE   Final    Gram Stain     Final    Value: FEW WBC PRESENT, PREDOMINANTLY PMN     NO SQUAMOUS EPITHELIAL CELLS SEEN     RARE GRAM NEGATIVE RODS     RARE GRAM POSITIVE RODS     RARE GRAM POSITIVE COCCI IN PAIRS   Culture NO GROWTH   Final    Report Status PENDING   Incomplete   CLOSTRIDIUM DIFFICILE BY PCR     Status: Normal   Collection Time   10/20/12  7:06 PM      Component Value Range Status Comment   C difficile by pcr NEGATIVE  NEGATIVE Final      Studies: No results found.  Scheduled Meds:   . aspirin EC  81 mg Oral Daily  . azithromycin  500 mg Oral Q24H  . carvedilol  25 mg Oral BID WC  . cefTRIAXone (ROCEPHIN)  IV  1 g Intravenous Q24H  . clopidogrel  75 mg Oral Q breakfast  . furosemide  40 mg Oral Daily  . heparin  5,000 Units Subcutaneous Q8H  . insulin aspart  0-9 Units Subcutaneous TID WC  . insulin glargine  22 Units Subcutaneous QHS  . ipratropium  0.5 mg Nebulization QID  . isosorbide mononitrate  30 mg Oral Daily  . LORazepam  0.5 mg Oral QHS  . losartan  100 mg Oral Daily  . potassium chloride  40 mEq Oral Daily  . simvastatin  40 mg Oral QPM   Continuous Infusions:  Principal Problem:  *CAP (community acquired pneumonia) Active Problems:  Chronic systolic heart failure  Diabetes mellitus    Time spent: 25    Marlin Canary  [facility] Pager [phone]. If 8PM-8AM, please contact night-coverage at [email or web address], password [facility] 10/21/2012, 7:54 AM  LOS: 3 days

## 2012-10-21 NOTE — Plan of Care (Signed)
Problem: Elimination Goal: Adequate hydration Outcome: Completed/Met Date Met:  10/21/12 Diarrhea has ceased pt encouraged and has been drinking fluids

## 2012-10-21 NOTE — Progress Notes (Signed)
Pt had some mucus build up in trach and lungs and asked for some mucinex. Called doctor. Pt took med

## 2012-10-22 DIAGNOSIS — I2510 Atherosclerotic heart disease of native coronary artery without angina pectoris: Secondary | ICD-10-CM

## 2012-10-22 LAB — GLUCOSE, CAPILLARY
Glucose-Capillary: 165 mg/dL — ABNORMAL HIGH (ref 70–99)
Glucose-Capillary: 138 mg/dL — ABNORMAL HIGH (ref 70–99)

## 2012-10-22 LAB — CBC
Platelets: 202 10*3/uL (ref 150–400)
RBC: 2.76 MIL/uL — ABNORMAL LOW (ref 3.87–5.11)
RDW: 15.6 % — ABNORMAL HIGH (ref 11.5–15.5)
WBC: 8.8 10*3/uL (ref 4.0–10.5)

## 2012-10-22 LAB — BASIC METABOLIC PANEL
CO2: 26 mEq/L (ref 19–32)
Chloride: 105 mEq/L (ref 96–112)
Creatinine, Ser: 1.18 mg/dL — ABNORMAL HIGH (ref 0.50–1.10)
GFR calc Af Amer: 54 mL/min — ABNORMAL LOW (ref >90)
Potassium: 4.1 mEq/L (ref 3.5–5.1)
Sodium: 141 mEq/L (ref 135–145)

## 2012-10-22 NOTE — Progress Notes (Signed)
[facility] PROGRESS NOTE  [name] ZOX:[id number] DOB: Nov 03, 1945 DOA: 10/18/2012 PCP: Cala Bradford, MD    Assessment/Plan:    *CAP (community acquired pneumonia)  No fever in the last 24 hours.  -does not appear toxic  -c/w Rocephin and Zithromax  -blood cultures 12/27-pending  Very well could be viral- family member had similar symptoms  Chronic systolic heart failure  -Continue with oral Lasix. Replete potassium.  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  -?fe def anemia- add Fe -outpatient colonoscopy    Code Status: full Family Communication:  Disposition Plan: home on tuesday   Consultants:    Procedures:    Antibiotics:    HPI/Subjective: Eating better No SOB Still not feeling 100%   Objective: Filed Vitals:   10/21/12 2353 10/22/12 0000 10/22/12 0450 10/22/12 0455  BP: 166/78   131/82  Pulse: 85 84 77 85  Temp: 98.7 F (37.1 C)   98.4 F (36.9 C)  TempSrc: Oral   Oral  Resp: 20 16 16 18   Height:      Weight:      SpO2: 97% 100% 100% 95%    Intake/Output Summary (Last 24 hours) at 10/22/12 0930 Last data filed at 10/21/12 1800  Gross per 24 hour  Intake    320 ml  Output    300 ml  Net     20 ml   Filed Weights   10/19/12 0105  Weight: 70.9 kg (156 lb 4.9 oz)    Exam:  Gen Exam: Awake and alert with clear speech. She is not clinically toxic or septic.  Neck: Supple, No JVD. Trach in place  Chest: B/L Clear.  CVS: S1 S2 Regular, no murmurs.  Abdomen: soft, BS +, non tender, non distended.  Extremities:  no edema, lower extremities warm to touch.  Neurologic: Non Focal.  Skin: No Rash.    Data Reviewed: Basic Metabolic Panel:  Lab 10/22/12 8119 10/21/12 0720 10/20/12 0502 10/19/12 0629 10/18/12 2352 10/18/12 2143  NA 141 145 142 141 -- 139  K 4.1 4.1 3.3* 3.4* -- 3.6  CL 105 109 106 104 -- 102  CO2 26 25 27 27  -- 26  GLUCOSE 180* 120* 67* 140* -- 287*  BUN 20 13 18 20  -- 20  CREATININE 1.18* 1.07 1.19* 1.30* 1.24* --  CALCIUM 8.8 8.8 8.5 8.5 -- 8.7  MG -- -- -- -- -- --  PHOS -- -- -- -- -- --   Liver Function Tests:  Lab 10/21/12 0720  AST 18  ALT 24  ALKPHOS 156*  BILITOT 0.3  PROT 6.8  ALBUMIN 2.9*   No results found for this basename: LIPASE:5,AMYLASE:5 in the last 168 hours No results found for this basename: AMMONIA:5 in the last 168 hours CBC:  Lab 10/22/12 0730 10/21/12 0720 10/20/12 0502 10/19/12 0629 10/18/12 2352 10/18/12 2143  WBC 8.8 7.3 8.9 10.5 9.7 --  NEUTROABS -- -- -- 8.4* -- 8.5*  HGB 8.1* 8.0* 7.8* 8.7* 9.1* --  HCT 24.9* 25.5* 24.8* 26.9*  27.5* --  MCV 90.2 91.1 91.2 90.3 89.0 --  PLT 202 166 157 147* 167 --   Cardiac Enzymes:  Lab 10/19/12 1200 10/19/12 0626 10/19/12 0013 10/18/12 2145  CKTOTAL -- -- -- --  CKMB -- -- -- --  CKMBINDEX -- -- -- --  TROPONINI <0.30 <0.30 <0.30 <0.30   BNP (last 3 results)  Basename 10/18/12 2145 12/20/11 2111 12/18/11 1142  PROBNP 6325.0* 853.8* 7071.0*   CBG:  Lab 10/22/12 0744 10/21/12 2138 10/21/12 1647 10/21/12 1154 10/21/12 0749  GLUCAP 165* 215* 183* 167* 112*    Recent Results (from the past 240 hour(s))  CULTURE, BLOOD (ROUTINE X 2)     Status: Normal (Preliminary result)   Collection Time   10/19/12 12:00 AM      Component Value Range Status Comment   Specimen Description BLOOD LEFT ARM   Final    Special Requests BOTTLES DRAWN AEROBIC AND ANAEROBIC 10CC   Final    Culture  Setup Time 10/19/2012 04:07   Final    Culture     Final    Value:        BLOOD CULTURE RECEIVED NO GROWTH TO  DATE CULTURE WILL BE HELD FOR 5 DAYS BEFORE ISSUING A FINAL NEGATIVE REPORT   Report Status PENDING   Incomplete   CULTURE, BLOOD (ROUTINE X 2)     Status: Normal (Preliminary result)   Collection Time   10/19/12 12:10 AM      Component Value Range Status Comment   Specimen Description BLOOD LEFT HAND   Final    Special Requests BOTTLES DRAWN AEROBIC AND ANAEROBIC 10CC   Final    Culture  Setup Time 10/19/2012 04:07   Final    Culture     Final    Value:        BLOOD CULTURE RECEIVED NO GROWTH TO DATE CULTURE WILL BE HELD FOR 5 DAYS BEFORE ISSUING A FINAL NEGATIVE REPORT   Report Status PENDING   Incomplete   CULTURE, RESPIRATORY     Status: Normal   Collection Time   10/19/12 12:14 PM      Component Value Range Status Comment   Specimen Description TRACHEAL ASPIRATE   Final    Special Requests NONE   Final    Gram Stain     Final    Value: FEW WBC PRESENT, PREDOMINANTLY PMN     NO SQUAMOUS EPITHELIAL CELLS SEEN     RARE GRAM NEGATIVE RODS     RARE GRAM POSITIVE RODS     RARE GRAM POSITIVE COCCI IN PAIRS   Culture Non-Pathogenic Oropharyngeal-type Flora Isolated.   Final    Report Status 10/21/2012 FINAL   Final   CLOSTRIDIUM DIFFICILE BY PCR     Status: Normal   Collection Time   10/20/12  7:06 PM      Component Value Range Status Comment   C difficile by pcr NEGATIVE  NEGATIVE Final      Studies: No results found.  Scheduled Meds:    . aspirin EC  81 mg Oral Daily  . azithromycin  500 mg Oral Q24H  . carvedilol  25 mg Oral BID WC  . cefTRIAXone (ROCEPHIN)  IV  1 g Intravenous Q24H  . clopidogrel  75 mg Oral Q breakfast  . ferrous fumarate  1 tablet Oral BID  . furosemide  40 mg Oral Daily  . heparin  5,000 Units Subcutaneous Q8H  . insulin aspart  0-9 Units Subcutaneous TID  WC  . insulin glargine  22 Units Subcutaneous QHS  . ipratropium  0.5 mg Nebulization QID  . isosorbide mononitrate  30 mg Oral Daily  . LORazepam  0.5 mg Oral QHS  . losartan  100 mg Oral  Daily  . potassium chloride  40 mEq Oral Daily  . simvastatin  40 mg Oral QPM   Continuous Infusions:   Principal Problem:  *CAP (community acquired pneumonia) Active Problems:  Chronic systolic heart failure  Diabetes mellitus  Anemia    Time spent: 25    [facility], JESSICA  [facility] Pager [phone]. If 8PM-8AM, please contact night-coverage at [email or web address], password [facility] 10/22/2012, 9:30 AM  LOS: 4 days

## 2012-10-23 LAB — GLUCOSE, CAPILLARY
Glucose-Capillary: 232 mg/dL — ABNORMAL HIGH (ref 70–99)
Glucose-Capillary: 254 mg/dL — ABNORMAL HIGH (ref 70–99)

## 2012-10-23 LAB — BASIC METABOLIC PANEL
Calcium: 9 mg/dL (ref 8.4–10.5)
GFR calc Af Amer: 60 mL/min — ABNORMAL LOW (ref >90)
GFR calc non Af Amer: 52 mL/min — ABNORMAL LOW (ref >90)
Glucose, Bld: 166 mg/dL — ABNORMAL HIGH (ref 70–99)
Sodium: 140 mEq/L (ref 135–145)

## 2012-10-23 LAB — CBC
Hemoglobin: 7.8 g/dL — ABNORMAL LOW (ref 12.0–15.0)
MCH: 29.2 pg (ref 26.0–34.0)
MCHC: 32.5 g/dL (ref 30.0–36.0)
RDW: 15.8 % — ABNORMAL HIGH (ref 11.5–15.5)

## 2012-10-23 MED ORDER — GUAIFENESIN ER 600 MG PO TB12: 600.0000 mg | ORAL_TABLET | Freq: Two times a day (BID) | ORAL | Status: DC | PRN

## 2012-10-23 MED ORDER — LEVOFLOXACIN 500 MG PO TABS: 500.0000 mg | ORAL_TABLET | Freq: Every day | ORAL | Status: DC

## 2012-10-23 MED ORDER — POTASSIUM CHLORIDE ER 10 MEQ PO TBCR: 20.0000 meq | EXTENDED_RELEASE_TABLET | Freq: Two times a day (BID) | ORAL | Status: DC

## 2012-10-23 MED ORDER — FERROUS FUMARATE 325 (106 FE) MG PO TABS: 1.0000 | ORAL_TABLET | Freq: Two times a day (BID) | ORAL | Status: DC

## 2012-10-23 NOTE — Progress Notes (Signed)
Patient discharge teaching given, including activity, diet, follow-up appoints, and medications. Patient verbalized understanding of all discharge instructions. IV access was d/c'd. Vitals are stable. Skin is intact except as charted in most recent assessments. Pt to be escorted out by NT, to be driven home by family. 

## 2012-10-23 NOTE — Progress Notes (Signed)
Pt refused Trach teaching. Has appt w/  ENT Dr Jenne Pane in Jan 2014 for routine trach change. Pt is very confident in her ability to care for trach.

## 2012-10-23 NOTE — Progress Notes (Signed)
Pt called out for breathing treatment- told by NT. RN called number, no answer. Waited 15 mins and call again. Spoke to RT- Treatment will be given as soon as possible.

## 2012-10-23 NOTE — Care Management Note (Signed)
    Page 1 of 1   10/23/2012     5:55:47 PM   CARE MANAGEMENT NOTE 10/23/2012  Patient:  DAVITA, SUBLETT   Account Number:  [id number]  Date Initiated:  10/23/2012  Documentation initiated by:  Letha Cape  Subjective/Objective Assessment:   dx pna  admit- lives with daughter. Patient with trach collar, daughter suctions her. Pt is pretty independent otherwise.     Action/Plan:   respiratory education given to patient about suctioning trach.   Anticipated DC Date:  10/23/2012   Anticipated DC Plan:  HOME/SELF CARE      DC Planning Services  CM consult      Choice offered to / List presented to:             Status of service:  Completed, signed off Medicare Important Message given?   (If response is "NO", the following Medicare IM given date fields will be blank) Date Medicare IM given:   Date Additional Medicare IM given:    Discharge Disposition:  HOME/SELF CARE  Per UR Regulation:  Reviewed for med. necessity/level of care/duration of stay  If discussed at Long Length of Stay Meetings, dates discussed:    Comments:  10/23/12 12:51 Letha Cape RN, BSN [phone] patient lives with daughter, patient is pretty independent, she does her own trach care and daughter suctions her. Resp therapy came up to see patient today in room to give her some education about suctioning so that she would feel comfortable suctioning her self.  Patient for dc today.

## 2012-10-23 NOTE — Progress Notes (Signed)
Called RT again- told that they will be there as soon as they are able.

## 2012-10-23 NOTE — Discharge Summary (Signed)
Physician Discharge Summary  [name] PIR:[id number] DOB: [birth year] DOA: 10/18/2012  PCP: Cala Bradford, MD  Admit date: 10/18/2012 Discharge date: 10/23/2012  Time spent: 35 minutes  Recommendations for Outpatient Follow-up:  1. Outpatient colonoscopy 2. Cbc. Bmp 1 week  Discharge Diagnoses:  Principal Problem:  *CAP (community acquired pneumonia) Active Problems:  Chronic systolic heart failure  Diabetes mellitus  Anemia   Discharge Condition: improved  Diet recommendation: diabetic/cardiac  Filed Weights   10/19/12 0105  Weight: 70.9 kg (156 lb 4.9 oz)    History of present illness:  [name] is a 66 y.o. female with an extensive medical history including systolic CHF, subglottic stenosis s/p trach, who presents to the ED with c/o cough and SOB, cough is productive of a mucopurulent quality sputum. Symptoms onset 3 days ago and have been persistent. There is no fever she states (Temp 99.7 in ED) but patient does report some chills today. She stopped taking her lasix because she felt bad and had reduced PO intake, she feels that she is significantly dry at this time.  In the ED patient had T of 99.7, CXR demonstrated B basilar opacities that were patchy and felt to possibly represent a new PNA. She had gone to her PCP earlier today and he started her on azithromycin (took first dose today already), she was started on rocephin by the ED and hospitalist asked to admit.   Hospital Course:  CAP (community acquired pneumonia)  No fever in the last 24 hours.  -does not appear toxic  -treated with IV rocephin/azithro- changed to levaquin  -blood cultures 12/27-negative  Very well could be viral- family member had similar symptoms   Chronic systolic heart failure  -Continue with oral Lasix. Replete potassium.  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  -?fe def anemia- add Fe  -outpatient colonoscopy       Discharge Exam: Filed Vitals:   10/23/12 0500 10/23/12 0754 10/23/12 0956 10/23/12 1141  BP: 157/59  157/67   Pulse: 82  82   Temp: 98.9 F (37.2 C)     TempSrc: Oral     Resp: 20     Height:      Weight:      SpO2: 98% 98%  95%    General: A+Ox3, NAD Cardiovascular: rrr Respiratory: clear, no wheezing  Discharge Instructions  Discharge Orders    Future Orders Please Complete By Expires   Diet - low sodium heart healthy      Diet Carb Modified      Increase activity slowly      Discharge instructions      Comments:   BMP 1 week re K+ CBC       Medication List     As of 10/23/2012  1:30 PM    STOP taking these medications         moxifloxacin 400 MG tablet   Commonly known as: AVELOX      TAKE these medications  acetaminophen 500 MG tablet   Commonly known as: TYLENOL   Take 500 mg by mouth every 6 (six) hours as needed. For pain      albuterol (2.5 MG/3ML) 0.083% nebulizer solution   Commonly known as: PROVENTIL   Take 2.5 mg by nebulization 4 (four) times daily.      aspirin EC 81 MG tablet   Take 81 mg by mouth daily.      carvedilol 25 MG tablet   Commonly known as: COREG   Take 25 mg by mouth 2 (two) times daily with a meal.      clopidogrel 75 MG tablet   Commonly known as: PLAVIX   Take 75 mg by mouth daily.      ferrous fumarate 325 (106 FE) MG Tabs   Commonly known as: HEMOCYTE - 106 mg FE   Take 1 tablet (106 mg of iron total) by mouth 2 (two) times daily.      furosemide 40 MG tablet   Commonly known as: LASIX   Take 40 mg by mouth daily. May take  1 additional dose for a weight gain of 2 lbs      guaiFENesin 600 MG 12 hr tablet   Commonly known as: MUCINEX   Take 1 tablet (600 mg total) by mouth 2 (two) times daily as needed.      insulin aspart 100 UNIT/ML injection   Commonly known as: novoLOG   Inject 2-10 Units into the skin 3 (three) times daily before meals. Sliding scale as directed      insulin glargine 100 UNIT/ML injection   Commonly known as: LANTUS   Inject 22 Units into the skin at bedtime.      ipratropium 0.02 % nebulizer solution   Commonly known as: ATROVENT   Take 0.5 mg by nebulization 4 (four) times daily.      isosorbide mononitrate 30 MG 24 hr tablet   Commonly known as: IMDUR   Take 30 mg by mouth daily.      levofloxacin 500 MG tablet   Commonly known as: LEVAQUIN   Take 1 tablet (500 mg total) by mouth daily.      loperamide 2 MG capsule   Commonly known as: IMODIUM   Take 4 mg by mouth 4 (four) times daily as needed. For diarrhea      LORazepam 0.5 MG tablet   Commonly known as: ATIVAN   Take 0.5 mg by mouth at bedtime.      losartan 100 MG tablet   Commonly known as: COZAAR   Take 100 mg by mouth daily. Hold if SBP < 110: Hold if HR < 60      nitroGLYCERIN 0.4 MG SL tablet   Commonly known as: NITROSTAT   Place 0.4 mg under the tongue every 5 (five) minutes as needed. For chest pain      pantoprazole 40 MG tablet   Commonly known as: PROTONIX   Take 40 mg by mouth daily as needed. For heartburn      potassium chloride 10 MEQ tablet   Commonly known as: K-DUR   Take 2 tablets (20 mEq total) by mouth 2 (two) times daily.      simvastatin 40 MG tablet   Commonly known as: ZOCOR   Take 40 mg by mouth every evening.      traMADol 50 MG tablet   Commonly known as: ULTRAM   Take 50 mg by mouth every 6 (six) hours as needed. For pain  The results of significant diagnostics from this hospitalization (including imaging, microbiology, ancillary and laboratory) are listed  below for reference.    Significant Diagnostic Studies: Dg Chest 2 View  10/18/2012  *RADIOLOGY REPORT*  Clinical Data: Chest pain, shortness of breath  CHEST - 2 VIEW  Comparison: 07/13/2012  Findings: Tracheostomy stable.  Previous median sternotomy.  Stable cardiomegaly.  Mild interstitial infiltrates or edema as before. Now more conspicuous   patchy airspace opacities in both lung bases.  No effusion.  IMPRESSION: 1.  Persistent interstitial edema/infiltrates with new patchy bibasilar airspace opacities.   Original Report Authenticated By: D. Andria Rhein, MD     Microbiology: Recent Results (from the past 240 hour(s))  CULTURE, BLOOD (ROUTINE X 2)     Status: Normal (Preliminary result)   Collection Time   10/19/12 12:00 AM      Component Value Range Status Comment   Specimen Description BLOOD LEFT ARM   Final    Special Requests BOTTLES DRAWN AEROBIC AND ANAEROBIC 10CC   Final    Culture  Setup Time 10/19/2012 04:07   Final    Culture     Final    Value:        BLOOD CULTURE RECEIVED NO GROWTH TO DATE CULTURE WILL BE HELD FOR 5 DAYS BEFORE ISSUING A FINAL NEGATIVE REPORT   Report Status PENDING   Incomplete   CULTURE, BLOOD (ROUTINE X 2)     Status: Normal (Preliminary result)   Collection Time   10/19/12 12:10 AM      Component Value Range Status Comment   Specimen Description BLOOD LEFT HAND   Final    Special Requests BOTTLES DRAWN AEROBIC AND ANAEROBIC 10CC   Final    Culture  Setup Time 10/19/2012 04:07   Final    Culture     Final    Value:        BLOOD CULTURE RECEIVED NO GROWTH TO DATE CULTURE WILL BE HELD FOR 5 DAYS BEFORE ISSUING A FINAL NEGATIVE REPORT   Report Status PENDING   Incomplete   CULTURE, RESPIRATORY     Status: Normal   Collection Time   10/19/12 12:14 PM      Component Value Range Status Comment   Specimen Description TRACHEAL ASPIRATE   Final    Special Requests NONE   Final    Gram Stain     Final    Value: FEW WBC PRESENT, PREDOMINANTLY PMN     NO  SQUAMOUS EPITHELIAL CELLS SEEN     RARE GRAM NEGATIVE RODS     RARE GRAM POSITIVE RODS     RARE GRAM POSITIVE COCCI IN PAIRS   Culture Non-Pathogenic Oropharyngeal-type Flora Isolated.   Final    Report Status 10/21/2012 FINAL   Final   CLOSTRIDIUM DIFFICILE BY PCR     Status: Normal   Collection Time   10/20/12  7:06 PM      Component Value Range Status Comment   C difficile by pcr NEGATIVE  NEGATIVE Final      Labs: Basic Metabolic Panel:  Lab 10/23/12 1610 10/22/12 0730 10/21/12 0720 10/20/12 0502 10/19/12 0629  NA 140 141 145 142 141  K 4.2 4.1 4.1 3.3* 3.4*  CL 105 105 109 106 104  CO2 25 26 25 27 27   GLUCOSE 166* 180* 120* 67* 140*  BUN 21 20 13 18 20   CREATININE 1.09 1.18* 1.07 1.19* 1.30*  CALCIUM 9.0 8.8 8.8 8.5 8.5  MG -- -- -- -- --  PHOS -- -- -- -- --   Liver Function Tests:  Lab 10/21/12 0720  AST 18  ALT 24  ALKPHOS 156*  BILITOT 0.3  PROT 6.8  ALBUMIN 2.9*   No results found for this basename: LIPASE:5,AMYLASE:5 in the last 168 hours No results found for this basename: AMMONIA:5 in the last 168 hours CBC:  Lab 10/23/12 0530 10/22/12 0730 10/21/12 0720 10/20/12 0502 10/19/12 0629 10/18/12 2143  WBC 8.9 8.8 7.3 8.9 10.5 --  NEUTROABS -- -- -- -- 8.4* 8.5*  HGB 7.8* 8.1* 8.0* 7.8* 8.7* --  HCT 24.0* 24.9* 25.5* 24.8* 26.9* --  MCV 89.9 90.2 91.1 91.2 90.3 --  PLT 193 202 166 157 147* --   Cardiac Enzymes:  Lab 10/19/12 1200 10/19/12 0626 10/19/12 0013 10/18/12 2145  CKTOTAL -- -- -- --  CKMB -- -- -- --  CKMBINDEX -- -- -- --  TROPONINI <0.30 <0.30 <0.30 <0.30   BNP: BNP (last 3 results)  Basename 10/18/12 2145 12/20/11 2111 12/18/11 1142  PROBNP 6325.0* 853.8* 7071.0*   CBG:  Lab 10/23/12 1103 10/23/12 0748 10/22/12 2152 10/22/12 1624 10/22/12 1202  GLUCAP 232* 136* 219* 181* 138*       Signed:  VANN, JESSICA  [facility] 10/23/2012, 1:30 PM

## 2012-10-25 LAB — CULTURE, BLOOD (ROUTINE X 2): Culture: NO GROWTH

## 2012-10-31 IMAGING — CR DG CHEST 1V PORT
1 series · 1 of 1 positions shown · non-contrast
Comparison: Chest radiograph performed 12/01/2011

CLINICAL DATA: Weakness.

PORTABLE CHEST - 1 VIEW

[AP]
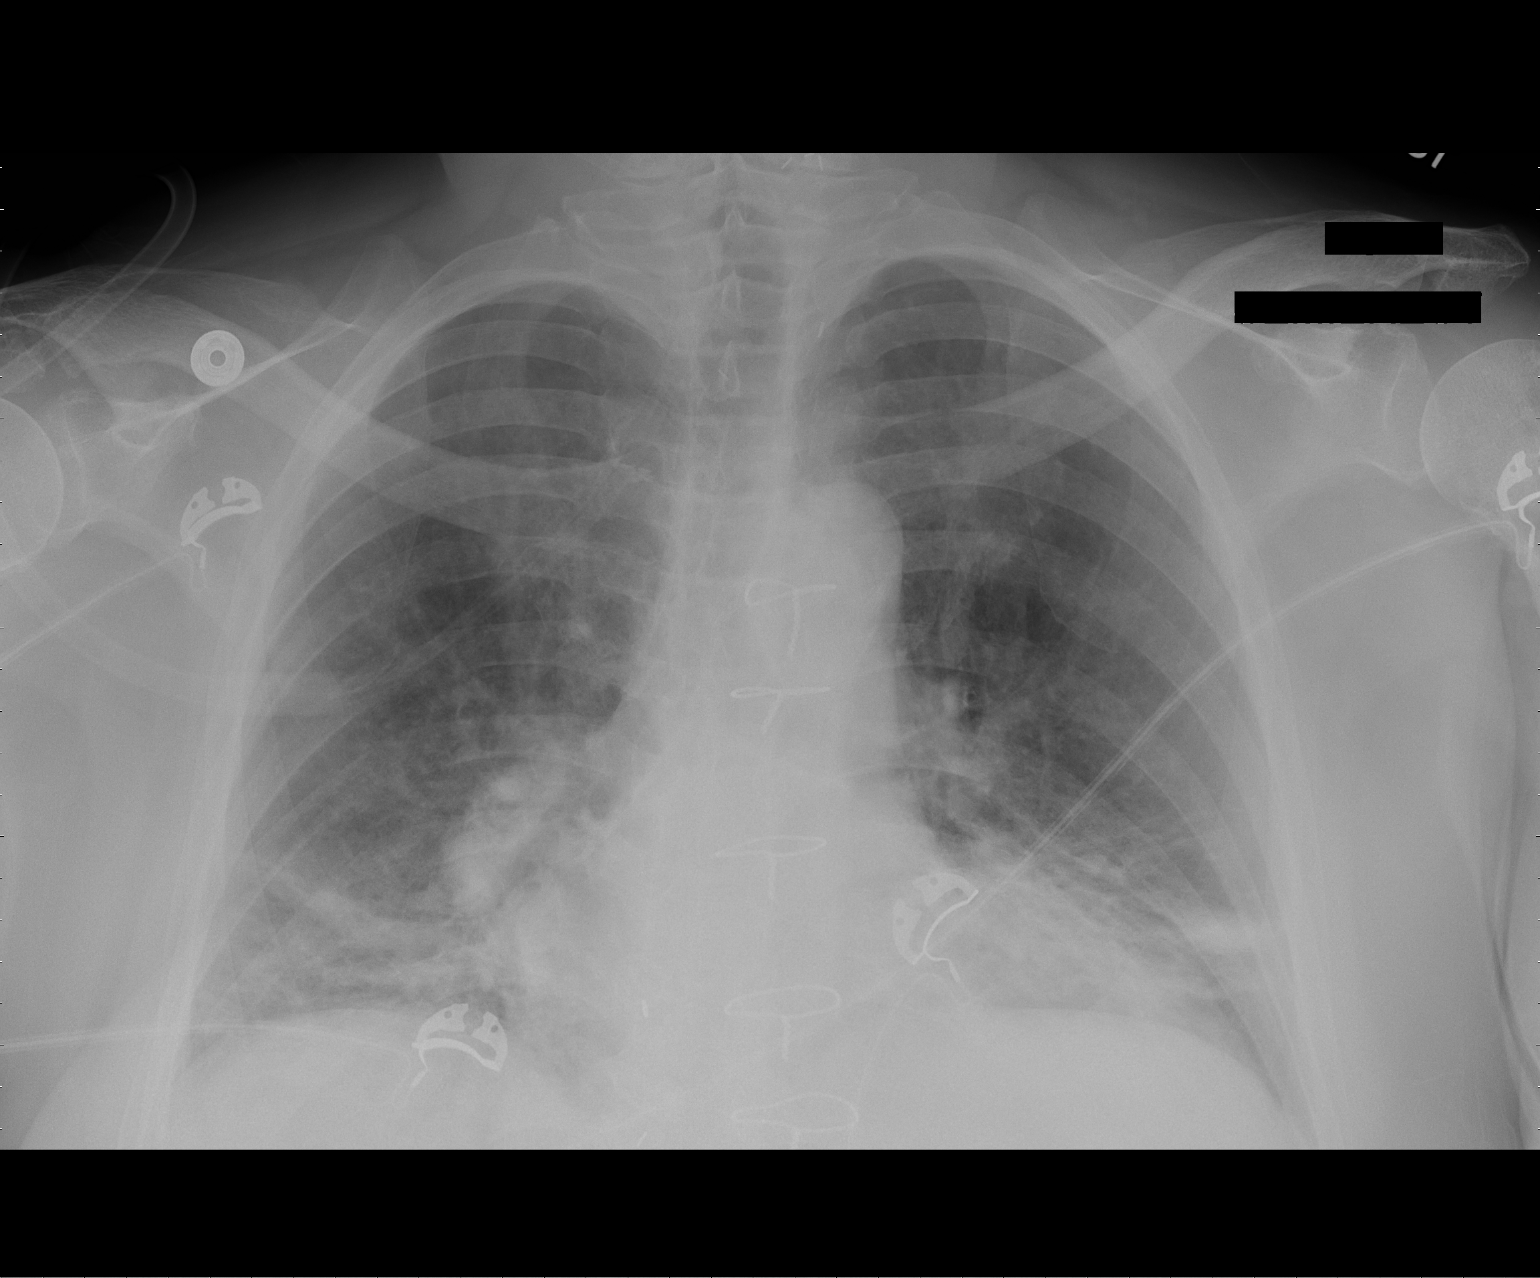

[1 of 1 positions shown; findings below may reference images not displayed]

FINDINGS: Worsening bibasilar airspace opacification is noted,
concerning for pneumonia.  The appearance is atypical for edema,
though mild underlying vascular congestion is noted.  No pleural
effusion or pneumothorax is seen.

The cardiomediastinal silhouette is mildly enlarged; the patient is
status post median sternotomy.  No acute osseous abnormalities are
identified.
IMPRESSION: 1.  Worsening bibasilar airspace opacification, concerning for
pneumonia.
2.  Underlying vascular congestion and mild cardiomegaly noted; the
appearance is atypical for edema.

## 2012-11-15 ENCOUNTER — Inpatient Hospital Stay (HOSPITAL_COMMUNITY)
Admission: EM | Admit: 2012-11-15 | Discharge: 2012-11-18 | DRG: 293 | Disposition: A | Discharge status: Home / Self Care | Payer: Medicare Other | Attending: Internal Medicine | Admitting: Internal Medicine

## 2012-11-15 ENCOUNTER — Encounter (HOSPITAL_COMMUNITY): Payer: Self-pay | Admitting: Emergency Medicine

## 2012-11-15 ENCOUNTER — Emergency Department (HOSPITAL_COMMUNITY): Payer: Medicare Other

## 2012-11-15 DIAGNOSIS — E1165 Type 2 diabetes mellitus with hyperglycemia: Secondary | ICD-10-CM | POA: Diagnosis present

## 2012-11-15 DIAGNOSIS — R0602 Shortness of breath: Secondary | ICD-10-CM | POA: Diagnosis present

## 2012-11-15 DIAGNOSIS — Z833 Family history of diabetes mellitus: Secondary | ICD-10-CM

## 2012-11-15 DIAGNOSIS — IMO0002 Reserved for concepts with insufficient information to code with codable children: Secondary | ICD-10-CM | POA: Diagnosis present

## 2012-11-15 DIAGNOSIS — Z951 Presence of aortocoronary bypass graft: Secondary | ICD-10-CM

## 2012-11-15 DIAGNOSIS — I5023 Acute on chronic systolic (congestive) heart failure: Principal | ICD-10-CM | POA: Diagnosis present

## 2012-11-15 DIAGNOSIS — D649 Anemia, unspecified: Secondary | ICD-10-CM | POA: Diagnosis present

## 2012-11-15 DIAGNOSIS — N189 Chronic kidney disease, unspecified: Secondary | ICD-10-CM | POA: Diagnosis present

## 2012-11-15 DIAGNOSIS — J4489 Other specified chronic obstructive pulmonary disease: Secondary | ICD-10-CM | POA: Diagnosis present

## 2012-11-15 DIAGNOSIS — E785 Hyperlipidemia, unspecified: Secondary | ICD-10-CM | POA: Diagnosis present

## 2012-11-15 DIAGNOSIS — I129 Hypertensive chronic kidney disease with stage 1 through stage 4 chronic kidney disease, or unspecified chronic kidney disease: Secondary | ICD-10-CM | POA: Diagnosis present

## 2012-11-15 DIAGNOSIS — I509 Heart failure, unspecified: Secondary | ICD-10-CM | POA: Diagnosis present

## 2012-11-15 DIAGNOSIS — E039 Hypothyroidism, unspecified: Secondary | ICD-10-CM | POA: Diagnosis present

## 2012-11-15 DIAGNOSIS — Z87891 Personal history of nicotine dependence: Secondary | ICD-10-CM

## 2012-11-15 DIAGNOSIS — Z8673 Personal history of transient ischemic attack (TIA), and cerebral infarction without residual deficits: Secondary | ICD-10-CM

## 2012-11-15 DIAGNOSIS — I2589 Other forms of chronic ischemic heart disease: Secondary | ICD-10-CM | POA: Diagnosis present

## 2012-11-15 DIAGNOSIS — K219 Gastro-esophageal reflux disease without esophagitis: Secondary | ICD-10-CM | POA: Diagnosis present

## 2012-11-15 DIAGNOSIS — I252 Old myocardial infarction: Secondary | ICD-10-CM

## 2012-11-15 DIAGNOSIS — I2510 Atherosclerotic heart disease of native coronary artery without angina pectoris: Secondary | ICD-10-CM

## 2012-11-15 DIAGNOSIS — Z930 Tracheostomy status: Secondary | ICD-10-CM

## 2012-11-15 DIAGNOSIS — I70209 Unspecified atherosclerosis of native arteries of extremities, unspecified extremity: Secondary | ICD-10-CM | POA: Diagnosis present

## 2012-11-15 DIAGNOSIS — J449 Chronic obstructive pulmonary disease, unspecified: Secondary | ICD-10-CM | POA: Diagnosis present

## 2012-11-15 DIAGNOSIS — E119 Type 2 diabetes mellitus without complications: Secondary | ICD-10-CM | POA: Diagnosis present

## 2012-11-15 DIAGNOSIS — I10 Essential (primary) hypertension: Secondary | ICD-10-CM

## 2012-11-15 DIAGNOSIS — Z9861 Coronary angioplasty status: Secondary | ICD-10-CM

## 2012-11-15 LAB — BASIC METABOLIC PANEL
GFR calc Af Amer: 77 mL/min — ABNORMAL LOW (ref >90)
GFR calc non Af Amer: 66 mL/min — ABNORMAL LOW (ref >90)
Potassium: 3.8 mEq/L (ref 3.5–5.1)
Sodium: 142 mEq/L (ref 135–145)

## 2012-11-15 LAB — CBC
HCT: 31.9 % — ABNORMAL LOW (ref 36.0–46.0)
Hemoglobin: 10.2 g/dL — ABNORMAL LOW (ref 12.0–15.0)
MCHC: 32.2 g/dL (ref 30.0–36.0)
MCHC: 32 g/dL (ref 30.0–36.0)
MCV: 90.4 fL (ref 78.0–100.0)
RDW: 16.6 % — ABNORMAL HIGH (ref 11.5–15.5)
RDW: 16.5 % — ABNORMAL HIGH (ref 11.5–15.5)

## 2012-11-15 LAB — CREATININE, SERUM
GFR calc Af Amer: 71 mL/min — ABNORMAL LOW (ref >90)
GFR calc non Af Amer: 61 mL/min — ABNORMAL LOW (ref >90)

## 2012-11-15 LAB — GLUCOSE, CAPILLARY: Glucose-Capillary: 249 mg/dL — ABNORMAL HIGH (ref 70–99)

## 2012-11-15 LAB — PRO B NATRIURETIC PEPTIDE: Pro B Natriuretic peptide (BNP): 5763 pg/mL — ABNORMAL HIGH (ref 0–125)

## 2012-11-15 LAB — MRSA PCR SCREENING: MRSA by PCR: NEGATIVE

## 2012-11-15 LAB — HEMOGLOBIN A1C: Hgb A1c MFr Bld: 8.9 % — ABNORMAL HIGH (ref <5.7)

## 2012-11-15 MED ORDER — FERROUS FUMARATE 325 (106 FE) MG PO TABS
1.0000 | ORAL_TABLET | Freq: Two times a day (BID) | ORAL | Status: DC
  Filled 2012-11-15: qty 1

## 2012-11-15 MED ORDER — LOSARTAN POTASSIUM 50 MG PO TABS
100.0000 mg | ORAL_TABLET | Freq: Every day | ORAL | Status: DC
  Administered 2012-11-15 – 2012-11-18 (×4): 100 mg via ORAL
  Filled 2012-11-15 (×5): qty 2

## 2012-11-15 MED ORDER — LEVOFLOXACIN IN D5W 500 MG/100ML IV SOLN
500.0000 mg | INTRAVENOUS | Status: AC
  Administered 2012-11-15: 500 mg via INTRAVENOUS
  Filled 2012-11-15: qty 100

## 2012-11-15 MED ORDER — FERROUS SULFATE 325 (65 FE) MG PO TABS
325.0000 mg | ORAL_TABLET | Freq: Two times a day (BID) | ORAL | Status: DC
  Administered 2012-11-16 – 2012-11-18 (×5): 325 mg via ORAL
  Filled 2012-11-15 (×8): qty 1

## 2012-11-15 MED ORDER — CLOPIDOGREL BISULFATE 75 MG PO TABS
75.0000 mg | ORAL_TABLET | Freq: Every day | ORAL | Status: DC
  Administered 2012-11-15 – 2012-11-18 (×4): 75 mg via ORAL
  Filled 2012-11-15 (×5): qty 1

## 2012-11-15 MED ORDER — ONDANSETRON HCL 4 MG/2ML IJ SOLN: 4.0000 mg | Freq: Four times a day (QID) | INTRAMUSCULAR | Status: DC | PRN

## 2012-11-15 MED ORDER — GUAIFENESIN ER 600 MG PO TB12
600.0000 mg | ORAL_TABLET | Freq: Two times a day (BID) | ORAL | Status: DC | PRN
  Filled 2012-11-15 (×2): qty 1

## 2012-11-15 MED ORDER — SODIUM CHLORIDE 0.9 % IJ SOLN: 3.0000 mL | INTRAMUSCULAR | Status: DC | PRN

## 2012-11-15 MED ORDER — TRAMADOL HCL 50 MG PO TABS
50.0000 mg | ORAL_TABLET | Freq: Four times a day (QID) | ORAL | Status: DC | PRN
  Administered 2012-11-16 – 2012-11-17 (×3): 50 mg via ORAL
  Filled 2012-11-15 (×3): qty 1

## 2012-11-15 MED ORDER — IPRATROPIUM BROMIDE 0.02 % IN SOLN
0.5000 mg | Freq: Four times a day (QID) | RESPIRATORY_TRACT | Status: DC
  Administered 2012-11-15 – 2012-11-18 (×11): 0.5 mg via RESPIRATORY_TRACT
  Filled 2012-11-15 (×12): qty 2.5

## 2012-11-15 MED ORDER — ENOXAPARIN SODIUM 40 MG/0.4ML ~~LOC~~ SOLN
40.0000 mg | SUBCUTANEOUS | Status: DC
  Administered 2012-11-15 – 2012-11-17 (×3): 40 mg via SUBCUTANEOUS
  Filled 2012-11-15 (×5): qty 0.4

## 2012-11-15 MED ORDER — POTASSIUM CHLORIDE ER 10 MEQ PO TBCR
20.0000 meq | EXTENDED_RELEASE_TABLET | Freq: Two times a day (BID) | ORAL | Status: DC
  Administered 2012-11-16 – 2012-11-18 (×5): 20 meq via ORAL
  Filled 2012-11-15 (×9): qty 2

## 2012-11-15 MED ORDER — FUROSEMIDE 10 MG/ML IJ SOLN
40.0000 mg | Freq: Two times a day (BID) | INTRAMUSCULAR | Status: DC
  Administered 2012-11-15 – 2012-11-17 (×4): 40 mg via INTRAVENOUS
  Filled 2012-11-15 (×6): qty 4

## 2012-11-15 MED ORDER — ALBUTEROL SULFATE (5 MG/ML) 0.5% IN NEBU: 2.5000 mg | INHALATION_SOLUTION | RESPIRATORY_TRACT | Status: DC | PRN

## 2012-11-15 MED ORDER — SODIUM CHLORIDE 0.9 % IJ SOLN
3.0000 mL | Freq: Two times a day (BID) | INTRAMUSCULAR | Status: DC
  Administered 2012-11-15 – 2012-11-18 (×6): 3 mL via INTRAVENOUS

## 2012-11-15 MED ORDER — INSULIN ASPART 100 UNIT/ML ~~LOC~~ SOLN: 0.0000 [IU] | Freq: Three times a day (TID) | SUBCUTANEOUS | Status: DC

## 2012-11-15 MED ORDER — LEVOFLOXACIN IN D5W 500 MG/100ML IV SOLN
500.0000 mg | INTRAVENOUS | Status: DC
  Administered 2012-11-16 – 2012-11-17 (×2): 500 mg via INTRAVENOUS
  Filled 2012-11-15 (×3): qty 100

## 2012-11-15 MED ORDER — IRBESARTAN 300 MG PO TABS
300.0000 mg | ORAL_TABLET | Freq: Every day | ORAL | Status: DC
  Administered 2012-11-15 – 2012-11-16 (×2): 300 mg via ORAL
  Filled 2012-11-15 (×4): qty 1

## 2012-11-15 MED ORDER — ASPIRIN EC 81 MG PO TBEC
81.0000 mg | DELAYED_RELEASE_TABLET | Freq: Every day | ORAL | Status: DC
  Administered 2012-11-15 – 2012-11-18 (×4): 81 mg via ORAL
  Filled 2012-11-15 (×5): qty 1

## 2012-11-15 MED ORDER — NITROGLYCERIN 0.4 MG SL SUBL: 0.4000 mg | SUBLINGUAL_TABLET | SUBLINGUAL | Status: DC | PRN

## 2012-11-15 MED ORDER — INSULIN GLARGINE 100 UNIT/ML ~~LOC~~ SOLN
22.0000 [IU] | Freq: Every day | SUBCUTANEOUS | Status: DC
  Administered 2012-11-15: 22 [IU] via SUBCUTANEOUS

## 2012-11-15 MED ORDER — FUROSEMIDE 10 MG/ML IJ SOLN
40.0000 mg | Freq: Once | INTRAMUSCULAR | Status: AC
  Administered 2012-11-15: 40 mg via INTRAVENOUS
  Filled 2012-11-15: qty 4

## 2012-11-15 MED ORDER — CARVEDILOL 25 MG PO TABS
25.0000 mg | ORAL_TABLET | Freq: Two times a day (BID) | ORAL | Status: DC
  Administered 2012-11-16 – 2012-11-18 (×5): 25 mg via ORAL
  Filled 2012-11-15 (×7): qty 1

## 2012-11-15 MED ORDER — ACETAMINOPHEN 325 MG PO TABS: 650.0000 mg | ORAL_TABLET | ORAL | Status: DC | PRN

## 2012-11-15 MED ORDER — INSULIN ASPART 100 UNIT/ML ~~LOC~~ SOLN
0.0000 [IU] | Freq: Every day | SUBCUTANEOUS | Status: DC
  Administered 2012-11-15 – 2012-11-16 (×2): 2 [IU] via SUBCUTANEOUS

## 2012-11-15 MED ORDER — SIMVASTATIN 40 MG PO TABS
40.0000 mg | ORAL_TABLET | Freq: Every evening | ORAL | Status: DC
  Administered 2012-11-15 – 2012-11-17 (×3): 40 mg via ORAL
  Filled 2012-11-15 (×5): qty 1

## 2012-11-15 MED ORDER — SODIUM CHLORIDE 0.9 % IV SOLN: 250.0000 mL | INTRAVENOUS | Status: DC | PRN

## 2012-11-15 NOTE — ED Notes (Signed)
Pt here from home with c/o sob and orange like secretions . Pt has had trach since last march, pt is on 3 1/2 liters  Pt is in no acute distress

## 2012-11-15 NOTE — ED Notes (Signed)
Pt moved from Trauma B to Room 34; pt placed back on monitor, continuous pulse oximetry, blood pressure cuff and oxygen (3L)

## 2012-11-15 NOTE — ED Notes (Signed)
Pt updated on plan of care.  Pt st's she has not eaten all day, [place] sandwich given to pt.

## 2012-11-15 NOTE — H&P (Signed)
[facility] History and Physical  [name] YNW:[id number] DOB: 07-Oct-1946 DOA: 11/15/2012  PCP: Cala Bradford, MD  Specialists: Dr Anne Fu- cardiologist  Chief Complaint: shortness of breath since yesterday.  HPI: [name] is a 67 y.o. female with prior h/o chronic systolic heart failure, copd, s/p tracheostomyin the past with trach collar on 3 lit of oxygen comes in for sob since yesterday, associated with orthopnea and PND, worsening pedal edema, worsening cough with pink sputum, denies chest pain, diaphoresis, syncopal episodes or palpitations. On arrival to ED, she was found to have pulmonary edema on CXR, sitting up on the bed comfortable on 3 lit of trach oxygen. She was given a dose of lasix and we were called to admit for further evaluation and management of acute on chronic systolic heart failure.    Review of Systems: The patient denies anorexia, fever, weight loss,, vision loss, decreased hearing, hoarseness, chest pain, syncope, balance deficits, hemoptysis, abdominal pain, melena, hematochezia, severe indigestion/heartburn, hematuria, incontinence, genital sores, muscle weakness, suspicious skin lesions, transient blindness, difficulty walking, depression, unusual weight change, abnormal bleeding, enlarged lymph nodes, angioedema, and breast masses.    Past Medical History  Diagnosis Date  . Diabetes mellitus     on Lantus 30 U   . CAD (coronary artery disease)     S/p CABG and stenting  . PAD (peripheral artery disease)   . Stroke     MRI 11/2011 with remote occipital lobe. MRA with moderate left focal vertebral artery stenosis  . CHF (congestive heart failure) 11/2011    Echo with EF 30-35%, global hypokinesis, and inferior akinesis  . Hyperlipidemia   . DDD (degenerative disc disease), lumbar   . CVA (cerebral vascular accident) 11/2010  . MI (myocardial infarction) 1997  . COPD (chronic obstructive pulmonary disease)   . Anemia   . Chronic kidney disease    . Irregular heart beat   . Hypertension   . GERD (gastroesophageal reflux disease)   . History of IBS   . Hypothyroidism     Goiter  . Thyroid disease   . Carotid artery occlusion    Past Surgical History  Procedure Date  . Ptca   . Thyroidectomy   . Coronary artery bypass graft     2 vessel  . Carotid endarterectomy ~2008    Left   . Cholecystectomy   . Tracheostomy tube placement 01/02/2012  . Angioplasty [id number]    Aortogram by Dr. [place] McK[facility])  . Pr vein bypass graft,aorto-fem-pop     Right common femoral-AK popliteal BPG & Right Popliteal-posterior tibial  . Pr vein bypass graft,aorto-fem-pop     Left Fem-pop BPG   Social History:  reports that she quit smoking about 21 months ago. Her smoking use included Cigarettes. She has a 50 pack-year smoking history. She has never used smokeless tobacco. She reports that she does not drink alcohol or use illicit drugs.  where does patient live--home, with daughter.  Allergies  Allergen Reactions  . Crestor (Rosuvastatin Calcium) Other (See Comments)    Muscle Pain  . Lisinopril Other (See Comments)    "Lowers her BP too low" & cough  . Vicodin (Hydrocodone-Acetaminophen) Nausea And Vomiting    Family History  Problem Relation Age of Onset  . Hyperlipidemia Mother   . Other Mother     AAA  . Diabetes Daughter     Prior to Admission medications   Medication Sig Start Date End Date Taking? Authorizing Provider  acetaminophen (TYLENOL) 500  MG tablet Take 500 mg by mouth every 6 (six) hours as needed. For pain   Yes Historical Provider, MD  albuterol (PROVENTIL) (2.5 MG/3ML) 0.083% nebulizer solution Take 2.5 mg by nebulization 4 (four) times daily.   Yes Historical Provider, MD  aspirin EC 81 MG tablet Take 81 mg by mouth daily.   Yes Historical Provider, MD  carvedilol (COREG) 25 MG tablet Take 25 mg by mouth 2 (two) times daily with a meal. 11/28/11  Yes Danley Danker, MD  clopidogrel (PLAVIX) 75  MG tablet Take 75 mg by mouth daily. 11/28/11  Yes Danley Danker, MD  ferrous fumarate (HEMOCYTE - 106 MG FE) 325 (106 FE) MG TABS Take 1 tablet (106 mg of iron total) by mouth 2 (two) times daily. 10/23/12  Yes Joseph Art, DO  furosemide (LASIX) 40 MG tablet Take 40 mg by mouth daily. May take 1 additional dose for a weight gain of 2 lbs   Yes Historical Provider, MD  guaiFENesin (MUCINEX) 600 MG 12 hr tablet Take 1 tablet (600 mg total) by mouth 2 (two) times daily as needed. 10/23/12  Yes Joseph Art, DO  insulin aspart (NOVOLOG) 100 UNIT/ML injection Inject 2-10 Units into the skin 3 (three) times daily before meals. Sliding scale as directed   Yes Historical Provider, MD  insulin glargine (LANTUS) 100 UNIT/ML injection Inject 22 Units into the skin at bedtime.   Yes Historical Provider, MD  ipratropium (ATROVENT) 0.02 % nebulizer solution Take 0.5 mg by nebulization 4 (four) times daily. 11/28/11 11/27/12 Yes Danley Danker, MD  irbesartan (AVAPRO) 300 MG tablet Take 300 mg by mouth at bedtime.   Yes Historical Provider, MD  loperamide (IMODIUM) 2 MG capsule Take 4 mg by mouth 4 (four) times daily as needed. For diarrhea   Yes Historical Provider, MD  losartan (COZAAR) 100 MG tablet Take 100 mg by mouth daily. Hold if SBP < 110: Hold if HR < 60 12/03/11 12/02/12 Yes Leodis Sias, MD  nitroGLYCERIN (NITROSTAT) 0.4 MG SL tablet Place 0.4 mg under the tongue every 5 (five) minutes as needed. For chest pain   Yes Historical Provider, MD  potassium chloride (K-DUR) 10 MEQ tablet Take 2 tablets (20 mEq total) by mouth 2 (two) times daily. 10/23/12  Yes Joseph Art, DO  simvastatin (ZOCOR) 40 MG tablet Take 40 mg by mouth every evening.  11/28/11  Yes Danley Danker, MD  traMADol (ULTRAM) 50 MG tablet Take 50 mg by mouth every 6 (six) hours as needed. For pain   Yes Historical Provider, MD   Physical Exam: Filed Vitals:   11/15/12 1337 11/15/12 1415 11/15/12 1430 11/15/12 1701    BP: 161/94 198/74 178/80 185/81  Pulse: 95 97 92 88  Temp:      TempSrc:      Resp: 18 26 33 20  SpO2: 98% 96% 99% 99%    Constitutional: Vital signs reviewed.  Patient is a well-developed and well-nourished  in no acute distress and cooperative with exam. Alert and oriented x3.  Head: Normocephalic and atraumatic Mouth: no erythema or exudates, MMM Eyes: PERRL, EOMI, conjunctivae normal, No scleral icterus.  Neck: s/p trach collar attached to 3 lit of oxygen.normal ROM, JV D present.  Cardiovascular: RRR, S1 normal, S2 normal, no MRG, pulses symmetric and intact bilaterally Pulmonary/Chest: CTAB, no wheezes, rales, or rhonchi Abdominal: Soft. Non-tender, non-distended, bowel sounds are normal, no masses, organomegaly, or guarding present.  Musculoskeletal: No joint deformities,  erythema, or stiffness, ROM full and no nontender, 2+ leg edema.  Neurological: A&O x3, Strength is normal and symmetric bilaterally, cranial nerve II-XII are grossly intact, no focal motor deficit, sensory intact to light touch bilaterally.  Skin: Warm, dry and intact. No rash, cyanosis, or clubbing.  Psychiatric: Normal mood and affect. speech and behavior is normal.  Labs on Admission:  Basic Metabolic Panel:  Lab 11/15/12 5784  NA 142  K 3.8  CL 104  CO2 28  GLUCOSE 221*  BUN 17  CREATININE 0.89  CALCIUM 9.0  MG --  PHOS --   Liver Function Tests: No results found for this basename: AST:5,ALT:5,ALKPHOS:5,BILITOT:5,PROT:5,ALBUMIN:5 in the last 168 hours No results found for this basename: LIPASE:5,AMYLASE:5 in the last 168 hours No results found for this basename: AMMONIA:5 in the last 168 hours CBC:  Lab 11/15/12 1254  WBC 6.7  NEUTROABS --  HGB 10.1*  HCT 31.4*  MCV 90.0  PLT 197   Cardiac Enzymes: No results found for this basename: CKTOTAL:5,CKMB:5,CKMBINDEX:5,TROPONINI:5 in the last 168 hours  BNP (last 3 results)  Basename 11/15/12 1254 10/18/12 2145 12/20/11 2111  PROBNP  5763.0* 6325.0* 853.8*   CBG: No results found for this basename: GLUCAP:5 in the last 168 hours  Radiological Exams on Admission: Dg Chest Portable 1 View  11/15/2012  *RADIOLOGY REPORT*  Clinical Data: Shortness of breath  PORTABLE CHEST - 1 VIEW  Comparison: 10/18/2012  Findings: Cardiomegaly.  Status post median sternotomy.  Stable tracheostomy tube position.  There is worsening in aeration with central mild vascular congestion and mild interstitial prominence bilaterally highly suspicious for pulmonary edema.  Probable bilateral small pleural effusion with bilateral basilar atelectasis or infiltrate.  IMPRESSION: There is worsening in aeration with central mild vascular congestion and mild interstitial prominence bilaterally highly suspicious for pulmonary edema.  Probable bilateral small pleural effusion with bilateral basilar atelectasis or infiltrate.   Original Report Authenticated By: Natasha Mead, M.D.     EKG: SINUS RHYTHM with t wave inversions in the lateral leads unchanged from the old EKG.   Assessment/Plan Active Problems: 1. Acute on chronic systolic heart failure:  - admit to step down for trach care - start patient on IV lasix 40 mg Q12hr. - potassium supplementation. - daily weight's and I/O's  - serial enzymes and a echocardiogram.  - repeat CXR in 24 to 48 hrs for resolution of the pulmonary edema.  - on coreg, 2 ARB's. Last echo an year ago showed LVEF of 30 to 35% with grade 1 diastolic dysfunction. Will repeat another echo today.  - as per the patient she is compliant to her medications and has been avoiding salt.  - cardiology consultation will be called in am.   2. Diabetes mellitus: resume home medications and SSI.  3. Hypertension: sub optimally controlled. Resume home medications.   4. COPD: She is currently not wheezing, hence no indication of steroids. Continue with nebs as needed.   5. S/p tracheostomy; trach care as per nursing with appropriate  suctiioning.   6/ worsening cough with increased sputum: will treat her with levaquin for possible bronchitis for 3 days.   7. Anemia: normocytic.and stable.   DVT prophylaxis.      Code Status: full code Family Communication: none at bedside, but daughter is on her way Disposition Plan: possibly in 2 days.   Time spent: 66 MIN  Rozena Fierro [facility] Pager [phone]  If 7PM-7AM, please contact night-coverage [email or web address] Password [facility] 11/15/2012, 5:16 PM

## 2012-11-15 NOTE — ED Notes (Signed)
Hospitalist MD at bedside. 

## 2012-11-15 NOTE — ED Notes (Signed)
Pt undressed, in gown, on monitor, continuous pulse oximetry, blood pressure cuff and oxygen (3L); EKG being performed

## 2012-11-15 NOTE — ED Provider Notes (Signed)
History     CSN: [id number]  Arrival date & time 11/15/12  1222   First MD Initiated Contact with Patient 11/15/12 1242      Chief Complaint  Patient presents with  . Shortness of Breath    (Consider location/radiation/quality/duration/timing/severity/associated sxs/prior treatment) HPI Pt with hx of CHF and multiple other medical problems with tracheostomy presents with increased secretions and cough as well as worsening shortness of breath.  She states that last night she was unable to sleep lying down.  Also c/o feeling short of breath with walking several steps at home.  She states she is coughing up orange colored secretions.  No fever/chills.  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  Denies chest pain. She states she takes lasix 40mg  po daily.  Feels that her legs are more swollen than usual.  Swelling is symmetric.  There are no other associated systemic symptoms, there are no other alleviating or modifying factors.   Past Medical History  Diagnosis Date  . Diabetes mellitus     on Lantus 30 U   . CAD (coronary artery disease)     S/p CABG and stenting  . PAD (peripheral artery disease)   . Stroke     MRI 11/2011 with remote occipital lobe. MRA with moderate left focal vertebral artery stenosis  . CHF (congestive heart failure) 11/2011    Echo with EF 30-35%, global hypokinesis, and inferior akinesis  . Hyperlipidemia   . DDD (degenerative disc disease), lumbar   . CVA (cerebral vascular accident) 11/2010  . MI (myocardial infarction) 1997  . COPD (chronic obstructive pulmonary disease)   . Anemia   . Chronic kidney disease   . Irregular heart beat   . Hypertension   . GERD (gastroesophageal reflux disease)   . History of IBS   . Hypothyroidism     Goiter  . Thyroid disease   . Carotid artery occlusion     Past Surgical History  Procedure Date  . Ptca   . Thyroidectomy   . Coronary artery bypass graft     2 vessel  . Carotid endarterectomy  ~2008    Left   . Cholecystectomy   . Tracheostomy tube placement 01/02/2012  . Angioplasty [id number]    Aortogram by Dr. [place] McKenzie [facility])  . Pr vein bypass graft,aorto-fem-pop     Right common femoral-AK popliteal BPG & Right Popliteal-posterior tibial  . Pr vein bypass graft,aorto-fem-pop     Left Fem-pop BPG    Family History  Problem Relation Age of Onset  . Hyperlipidemia Mother   . Other Mother     AAA  . Diabetes Daughter     History  Substance Use Topics  . Smoking status: Former Smoker -- 1.0 packs/day for 50 years    Types: Cigarettes    Quit date: 01/24/2011  . Smokeless tobacco: Never Used  . Alcohol Use: No    OB History    Grav Para Term Preterm Abortions TAB SAB Ect Mult Living                  Review of Systems ROS reviewed and all otherwise negative except for mentioned in HPI  Allergies  Crestor; Lisinopril; and Vicodin  Home Medications   No current outpatient prescriptions on file.  BP 147/87  Pulse 76  Temp 98.3 F (36.8 C) (Oral)  Resp 16  Ht 5\' 2"  (1.575 m)  Wt 157 lb 10.1 oz (71.5 kg)  BMI  28.83 kg/m2  SpO2 95% Vitals reviewed Physical Exam Physical Examination: General appearance - alert, well appearing, and in no distress Mental status - alert, oriented to person, place, and time Eyes - no conjunctival injection, no scleral icterus Mouth - mucous membranes moist, pharynx normal without lesions Neck-trach in place, mild JVD Chest - BSS,  no wheezes, rales or rhonchi, symmetric air entry with decreased air movement, speaking in 2-3 word sentences Heart - normal rate, regular rhythm, normal S1, S2, no murmurs, rubs, clicks or gallops Abdomen - soft, nontender, nondistended, no masses or organomegaly Extremities - peripheral pulses normal, no pedal edema, no clubbing or cyanosis Skin - normal coloration and turgor, no rashes  ED Course  Procedures (including critical care time)   Date: 11/15/2012  Rate: 99   Rhythm: normal sinus rhythm  QRS Axis: normal  Intervals: normal  ST/T Wave abnormalities: normal  Conduction Disutrbances:none  Narrative Interpretation:   Old EKG Reviewed: none available  4:13 PM d/w [facility] hospitalist for admission.  Pt will need stepdown bed due to tracheostomy.     Labs Reviewed  CBC - Abnormal; Notable for the following:    RBC 3.49 (*)     Hemoglobin 10.1 (*)     HCT 31.4 (*)     RDW 16.6 (*)     All other components within normal limits  BASIC METABOLIC PANEL - Abnormal; Notable for the following:    Glucose, Bld 221 (*)     GFR calc non Af Amer 66 (*)     GFR calc Af Amer 77 (*)     All other components within normal limits  PRO B NATRIURETIC PEPTIDE - Abnormal; Notable for the following:    Pro B Natriuretic peptide (BNP) 5763.0 (*)     All other components within normal limits  HEMOGLOBIN A1C - Abnormal; Notable for the following:    Hemoglobin A1C 8.9 (*)     Mean Plasma Glucose 209 (*)     All other components within normal limits  GLUCOSE, CAPILLARY - Abnormal; Notable for the following:    Glucose-Capillary 249 (*)     All other components within normal limits  BASIC METABOLIC PANEL - Abnormal; Notable for the following:    Potassium 3.4 (*)     Glucose, Bld 142 (*)     GFR calc non Af Amer 60 (*)     GFR calc Af Amer 69 (*)     All other components within normal limits  PRO B NATRIURETIC PEPTIDE - Abnormal; Notable for the following:    Pro B Natriuretic peptide (BNP) 8676.0 (*)     All other components within normal limits  CBC - Abnormal; Notable for the following:    RBC 3.53 (*)     Hemoglobin 10.2 (*)     HCT 31.9 (*)     RDW 16.5 (*)     All other components within normal limits  CREATININE, SERUM - Abnormal; Notable for the following:    GFR calc non Af Amer 61 (*)     GFR calc Af Amer 71 (*)     All other components within normal limits  GLUCOSE, CAPILLARY - Abnormal; Notable for the following:    Glucose-Capillary 249 (*)      All other components within normal limits  MRSA PCR SCREENING  MAGNESIUM  TROPONIN I  TROPONIN I  TSH  TROPONIN I   Dg Chest Portable 1 View  11/15/2012  *RADIOLOGY REPORT*  Clinical Data:  Shortness of breath  PORTABLE CHEST - 1 VIEW  Comparison: 10/18/2012  Findings: Cardiomegaly.  Status post median sternotomy.  Stable tracheostomy tube position.  There is worsening in aeration with central mild vascular congestion and mild interstitial prominence bilaterally highly suspicious for pulmonary edema.  Probable bilateral small pleural effusion with bilateral basilar atelectasis or infiltrate.  IMPRESSION: There is worsening in aeration with central mild vascular congestion and mild interstitial prominence bilaterally highly suspicious for pulmonary edema.  Probable bilateral small pleural effusion with bilateral basilar atelectasis or infiltrate.   Original Report Authenticated By: Natasha Mead, M.D.      1. CHF (congestive heart failure)   2. Tracheostomy in place   3. Acute on chronic systolic heart failure   4. Anemia   5. COPD (chronic obstructive pulmonary disease)   6. SOB (shortness of breath)       MDM  Pt with tracheostomy presenting with increased secretions and sob- especially with exertion and lying flat.  Workup in ED reveals pulmonary edema on CXR as well as elevated BNP.  EKG reassuring, other las also reassuring. Pt started on lasix and will be admitted to [facility] for further management.          Ethelda Chick, MD 11/16/12 [phone]

## 2012-11-15 NOTE — ED Notes (Signed)
Checked patient cbg it was 37 notified RN Kim of blood sugar

## 2012-11-16 DIAGNOSIS — I509 Heart failure, unspecified: Secondary | ICD-10-CM

## 2012-11-16 DIAGNOSIS — I10 Essential (primary) hypertension: Secondary | ICD-10-CM

## 2012-11-16 LAB — GLUCOSE, CAPILLARY
Glucose-Capillary: 102 mg/dL — ABNORMAL HIGH (ref 70–99)
Glucose-Capillary: 187 mg/dL — ABNORMAL HIGH (ref 70–99)

## 2012-11-16 LAB — BASIC METABOLIC PANEL
Calcium: 9.2 mg/dL (ref 8.4–10.5)
GFR calc Af Amer: 69 mL/min — ABNORMAL LOW (ref >90)
GFR calc non Af Amer: 60 mL/min — ABNORMAL LOW (ref >90)
Potassium: 3.4 mEq/L — ABNORMAL LOW (ref 3.5–5.1)
Sodium: 142 mEq/L (ref 135–145)

## 2012-11-16 LAB — MAGNESIUM: Magnesium: 1.9 mg/dL (ref 1.5–2.5)

## 2012-11-16 LAB — TSH: TSH: 6.16 u[IU]/mL — ABNORMAL HIGH (ref 0.350–4.500)

## 2012-11-16 LAB — TROPONIN I: Troponin I: <0.3 ng/mL (ref <0.30)

## 2012-11-16 MED ORDER — INSULIN GLARGINE 100 UNIT/ML ~~LOC~~ SOLN
25.0000 [IU] | Freq: Every day | SUBCUTANEOUS | Status: DC
  Administered 2012-11-16 – 2012-11-17 (×2): 25 [IU] via SUBCUTANEOUS

## 2012-11-16 MED ORDER — HYDRALAZINE HCL 20 MG/ML IJ SOLN
5.0000 mg | Freq: Four times a day (QID) | INTRAMUSCULAR | Status: DC | PRN
  Administered 2012-11-16: 5 mg via INTRAVENOUS
  Filled 2012-11-16: qty 1

## 2012-11-16 MED ORDER — CHLORHEXIDINE GLUCONATE 0.12 % MT SOLN
15.0000 mL | Freq: Two times a day (BID) | OROMUCOSAL | Status: DC
  Administered 2012-11-16 – 2012-11-17 (×3): 15 mL via OROMUCOSAL
  Filled 2012-11-16 (×5): qty 15

## 2012-11-16 MED ORDER — INSULIN ASPART 100 UNIT/ML ~~LOC~~ SOLN
0.0000 [IU] | Freq: Three times a day (TID) | SUBCUTANEOUS | Status: DC
  Administered 2012-11-16: 3 [IU] via SUBCUTANEOUS
  Administered 2012-11-17: 5 [IU] via SUBCUTANEOUS

## 2012-11-16 MED ORDER — LORAZEPAM 0.5 MG PO TABS
0.5000 mg | ORAL_TABLET | Freq: Every evening | ORAL | Status: DC | PRN
  Administered 2012-11-16 (×2): 0.5 mg via ORAL
  Filled 2012-11-16 (×3): qty 1

## 2012-11-16 MED ORDER — BIOTENE DRY MOUTH MT LIQD
15.0000 mL | Freq: Two times a day (BID) | OROMUCOSAL | Status: DC
  Administered 2012-11-16 (×2): 15 mL via OROMUCOSAL

## 2012-11-16 NOTE — Progress Notes (Signed)
PT BP is 191/84, 187/81 after PM lasix, avapro, and cozaar given. Call placed to MD. Orders for prn hydralazine. Will give and continue to monitor.  M.Foster Simpson, RN

## 2012-11-16 NOTE — Progress Notes (Signed)
Utilization Review Completed.  11/16/2012  

## 2012-11-16 NOTE — Progress Notes (Signed)
  Echocardiogram 2D Echocardiogram has been performed.  [name] FRANCES 11/16/2012, 4:36 PM

## 2012-11-16 NOTE — Care Management Note (Signed)
  Page 1 of 1   11/16/2012     3:37:14 PM   CARE MANAGEMENT NOTE 11/16/2012  Patient:  [name], [name]   Account Number:  [id number]  Date Initiated:  11/16/2012  Documentation initiated by:  Alvira Philips Assessment:   67 yr-old female adm with dx of acute on chronic systolic failure; lives with dtr, has h/o home health services with [facility], is active with [facility] sales representative  CM consult      HH arranged  HH - 11 Patient Refused      Per UR Regulation:  Reviewed for med. necessity/level of care/duration of stay  Comments:  PCP: Dr Laurann Montana  11/16/12 1452 Abrish Erny RN BSN MSN CCM Per pt, community nurse case manager calls her q week and visits once a month.  Feels she understands dx and has sufficient knowledge re DZ  mgmt, can contact case manager for any questions.  Was discharged from [facility] in April.  Declines home health referral.

## 2012-11-16 NOTE — Progress Notes (Signed)
[facility] PROGRESS NOTE  [name] ZOX:[id number] DOB: Jun 07, 1946 DOA: 11/15/2012 PCP: Cala Bradford, MD  Assessment/Plan: 1.  1. Acute on chronic systolic heart failure:  - admitted to step down for trach care  - started patient on IV lasix 40 mg Q12hr.  - potassium supplementation.  - I/O last 3 completed shifts: In: -  Out: 1500 [Urine:1500]      - serial enzymes are negative and a echocardiogram is pending.  - repeat CXR in 24 to 48 hrs for resolution of the pulmonary edema.  - on coreg, 2 ARB's. Last echo an year ago showed LVEF of 30 to 35% with grade 1 diastolic dysfunction.   - as per the patient she is compliant to her medications and has been avoiding salt.  - cardiology consultation called from [facility] cardiology..  2. Diabetes mellitus: resume home medications and SSI.  CBG (last 3)   Basename 11/15/12 2144 11/15/12 1827  GLUCAP 249* 249*     3. Hypertension: sub optimally controlled. Resume home medications.  4. COPD: She is currently not wheezing, hence no indication of steroids. Continue with nebs as needed.  5. S/p tracheostomy; trach care as per nursing with appropriate suctiioning.  6/ worsening cough with increased sputum: will treat her with levaquin for possible bronchitis for 3 days.  7. Anemia: normocytic.and stable.  DVT prophylaxis.   Code Status: full code Family Communication: none at bedside Disposition Plan: pending    Consultants:  [facility] cardiology  Procedures:  none  Antibiotics:  levaquin 1/23  HPI/Subjective: COMFORTABLE. Occasional cramps in the legs.   Objective: Filed Vitals:   11/16/12 0332 11/16/12 0414 11/16/12 0600 11/16/12 0755  BP: 165/80  147/87   Pulse: 91 76 74 76  Temp: 98.3 F (36.8 C)     TempSrc: Oral     Resp: 26 19 23 16   Height:      Weight:      SpO2: 99% 100% 99% 95%    Intake/Output Summary (Last 24 hours) at 11/16/12 0905 Last data filed at 11/16/12 0335  Gross per 24 hour    Intake      0 ml  Output   1500 ml  Net  -1500 ml   Filed Weights   11/15/12 1915  Weight: 71.5 kg (157 lb 10.1 oz)    Exam:   General:  Alert afebrile comfortable  Cardiovascular: s1s2  Respiratory: CTAB, no wheezing or rhonchi  Abdomen: soft NT ND BS+  Data Reviewed: Basic Metabolic Panel:  Lab 11/16/12 8119 11/15/12 2109 11/15/12 1254  NA 142 -- 142  K 3.4* -- 3.8  CL 103 -- 104  CO2 28 -- 28  GLUCOSE 142* -- 221*  BUN 15 -- 17  CREATININE 0.97 0.95 0.89  CALCIUM 9.2 -- 9.0  MG 1.9 -- --  PHOS -- -- --   Liver Function Tests: No results found for this basename: AST:5,ALT:5,ALKPHOS:5,BILITOT:5,PROT:5,ALBUMIN:5 in the last 168 hours No results found for this basename: LIPASE:5,AMYLASE:5 in the last 168 hours No results found for this basename: AMMONIA:5 in the last 168 hours CBC:  Lab 11/15/12 2109 11/15/12 1254  WBC 6.8 6.7  NEUTROABS -- --  HGB 10.2* 10.1*  HCT 31.9* 31.4*  MCV 90.4 90.0  PLT 195 197   Cardiac Enzymes:  Lab 11/16/12 0232 11/15/12 2109  CKTOTAL -- --  CKMB -- --  CKMBINDEX -- --  TROPONINI <0.30 <0.30   BNP (last 3 results)  Basename 11/16/12 0232 11/15/12 1254 10/18/12 2145  PROBNP 8676.0* 5763.0* 6325.0*   CBG:  Lab 11/15/12 2144 11/15/12 1827  GLUCAP 249* 249*    Recent Results (from the past 240 hour(s))  MRSA PCR SCREENING     Status: Normal   Collection Time   11/15/12  8:03 PM      Component Value Range Status Comment   MRSA by PCR NEGATIVE  NEGATIVE Final      Studies: Dg Chest Portable 1 View  11/15/2012  *RADIOLOGY REPORT*  Clinical Data: Shortness of breath  PORTABLE CHEST - 1 VIEW  Comparison: 10/18/2012  Findings: Cardiomegaly.  Status post median sternotomy.  Stable tracheostomy tube position.  There is worsening in aeration with central mild vascular congestion and mild interstitial prominence bilaterally highly suspicious for pulmonary edema.  Probable bilateral small pleural effusion with bilateral  basilar atelectasis or infiltrate.  IMPRESSION: There is worsening in aeration with central mild vascular congestion and mild interstitial prominence bilaterally highly suspicious for pulmonary edema.  Probable bilateral small pleural effusion with bilateral basilar atelectasis or infiltrate.   Original Report Authenticated By: Natasha Mead, M.D.     Scheduled Meds:   . antiseptic oral rinse  15 mL Mouth Rinse q12n4p  . aspirin EC  81 mg Oral Daily  . carvedilol  25 mg Oral BID WC  . chlorhexidine  15 mL Mouth Rinse BID  . clopidogrel  75 mg Oral Daily  . enoxaparin (LOVENOX) injection  40 mg Subcutaneous Q24H  . ferrous sulfate  325 mg Oral BID WC  . furosemide  40 mg Intravenous Q12H  . insulin aspart  0-5 Units Subcutaneous QHS  . insulin aspart  0-9 Units Subcutaneous TID WC  . insulin glargine  22 Units Subcutaneous QHS  . ipratropium  0.5 mg Nebulization QID  . irbesartan  300 mg Oral QHS  . levofloxacin (LEVAQUIN) IV  500 mg Intravenous Q24H  . losartan  100 mg Oral Daily  . potassium chloride  20 mEq Oral BID  . simvastatin  40 mg Oral QPM  . sodium chloride  3 mL Intravenous Q12H   Continuous Infusions:   Active Problems:  Diabetes mellitus  COPD (chronic obstructive pulmonary disease)  SOB (shortness of breath)  Acute on chronic systolic heart failure    Time spent:     [facility]  [facility] Pager [phone]. If 8PM-8AM, please contact night-coverage at [email or web address], password [facility] 11/16/2012, 9:05 AM  LOS: 1 day

## 2012-11-17 ENCOUNTER — Inpatient Hospital Stay (HOSPITAL_COMMUNITY): Payer: Medicare Other

## 2012-11-17 DIAGNOSIS — E119 Type 2 diabetes mellitus without complications: Secondary | ICD-10-CM

## 2012-11-17 LAB — GLUCOSE, CAPILLARY
Glucose-Capillary: 82 mg/dL (ref 70–99)
Glucose-Capillary: 204 mg/dL — ABNORMAL HIGH (ref 70–99)
Glucose-Capillary: 142 mg/dL — ABNORMAL HIGH (ref 70–99)
Glucose-Capillary: 150 mg/dL — ABNORMAL HIGH (ref 70–99)

## 2012-11-17 LAB — BASIC METABOLIC PANEL
BUN: 22 mg/dL (ref 6–23)
Chloride: 104 mEq/L (ref 96–112)
Creatinine, Ser: 1.29 mg/dL — ABNORMAL HIGH (ref 0.50–1.10)
Glucose, Bld: 120 mg/dL — ABNORMAL HIGH (ref 70–99)
Potassium: 3.8 mEq/L (ref 3.5–5.1)

## 2012-11-17 MED ORDER — FUROSEMIDE 40 MG PO TABS
40.0000 mg | ORAL_TABLET | Freq: Every day | ORAL | Status: DC
  Filled 2012-11-17: qty 1

## 2012-11-17 MED ORDER — SPIRONOLACTONE 25 MG PO TABS
25.0000 mg | ORAL_TABLET | Freq: Every day | ORAL | Status: DC
  Administered 2012-11-17 – 2012-11-18 (×2): 25 mg via ORAL
  Filled 2012-11-17 (×2): qty 1

## 2012-11-17 MED ORDER — FUROSEMIDE 40 MG PO TABS
40.0000 mg | ORAL_TABLET | Freq: Two times a day (BID) | ORAL | Status: DC
  Administered 2012-11-17 – 2012-11-18 (×2): 40 mg via ORAL
  Filled 2012-11-17 (×4): qty 1

## 2012-11-17 NOTE — Progress Notes (Signed)
Pt OOB to commode and chair. Ambulates half of unit with nurse contact guard. Pt states he does not need her O2 during the day. Good appetite. Strict I & O. Pt open to education on her condition and preventative steps.

## 2012-11-17 NOTE — Progress Notes (Signed)
[facility] PROGRESS NOTE  [name] ZOX:[id number] DOB: 1946/03/07 DOA: 11/15/2012 PCP: Cala Bradford, MD  Assessment/Plan: 1.  1. Acute on chronic systolic heart failure:  - admitted to step down for trach care  - started patient on IV lasix 40 mg Q12hr. , She has diuresed > 4lit , and her creatinine has increased to 1.2. We will change her iv lasix to po lasix today.  - potassium supplementation.  - I/O last 3 completed shifts: In: 752 [P.O.:640; IV Piggyback:112] Out: 4050 [Urine:4050] Total I/O In: 524 [P.O.:520; IV Piggyback:4] Out: 1000 [Urine:1000]    - serial enzymes are negative and a echocardiogram is done and results pending.  - repeat CXR pending. - on coreg, 2 ARB's. Last echo an year ago showed LVEF of 30 to 35% with grade 1 diastolic dysfunction.   - as per the patient she is compliant to her medications and has been avoiding salt.  - cardiology consultation called from [facility] cardiology..  2. Diabetes mellitus: resume home medications and SSI.  CBG (last 3)   Basename 11/17/12 1209 11/17/12 0805 11/16/12 2154  GLUCAP 204* 82 242*     3. Hypertension: sub optimally controlled. Resume home medications.  4. COPD: She is currently not wheezing, hence no indication of steroids. Continue with nebs as needed.  5. S/p tracheostomy; trach care as per nursing with appropriate suctiioning.  6/ worsening cough with increased sputum: will treat her with levaquin for possible bronchitis for 3 days.  7. Anemia: normocytic.and stable.  DVT prophylaxis.   Code Status: full code Family Communication: none at bedside Disposition Plan: pending    Consultants:  [facility] cardiology  Procedures:  none  Antibiotics:  levaquin 1/23  HPI/Subjective: COMFORTABLE. Occasional cramps in the legs.   Objective: Filed Vitals:   11/17/12 0350 11/17/12 0426 11/17/12 0727 11/17/12 1136  BP:  136/53 135/70 132/56  Pulse: 67 65 77 64  Temp:  98 F (36.7 C) 98.3 F  (36.8 C) 97.5 F (36.4 C)  TempSrc:  Oral Oral Oral  Resp: 17 17 18 23   Height:      Weight:  71.5 kg (157 lb 10.1 oz)    SpO2: 98% 98% 96%     Intake/Output Summary (Last 24 hours) at 11/17/12 1412 Last data filed at 11/17/12 1400  Gross per 24 hour  Intake    948 ml  Output   3100 ml  Net  -2152 ml   Filed Weights   11/15/12 1915 11/17/12 0426  Weight: 71.5 kg (157 lb 10.1 oz) 71.5 kg (157 lb 10.1 oz)    Exam:   General:  Alert afebrile comfortable  Cardiovascular: s1s2  Respiratory: CTAB, no wheezing or rhonchi  Abdomen: soft NT ND BS+  Data Reviewed: Basic Metabolic Panel:  Lab 11/17/12 8119 11/16/12 0234 11/15/12 2109 11/15/12 1254  NA 142 142 -- 142  K 3.8 3.4* -- 3.8  CL 104 103 -- 104  CO2 30 28 -- 28  GLUCOSE 120* 142* -- 221*  BUN 22 15 -- 17  CREATININE 1.29* 0.97 0.95 0.89  CALCIUM 8.8 9.2 -- 9.0  MG -- 1.9 -- --  PHOS -- -- -- --   Liver Function Tests: No results found for this basename: AST:5,ALT:5,ALKPHOS:5,BILITOT:5,PROT:5,ALBUMIN:5 in the last 168 hours No results found for this basename: LIPASE:5,AMYLASE:5 in the last 168 hours No results found for this basename: AMMONIA:5 in the last 168 hours CBC:  Lab 11/15/12 2109 11/15/12 1254  WBC 6.8 6.7  NEUTROABS -- --  HGB 10.2* 10.1*  HCT 31.9* 31.4*  MCV 90.4 90.0  PLT 195 197   Cardiac Enzymes:  Lab 11/16/12 0929 11/16/12 0232 11/15/12 2109  CKTOTAL -- -- --  CKMB -- -- --  CKMBINDEX -- -- --  TROPONINI <0.30 <0.30 <0.30   BNP (last 3 results)  Basename 11/16/12 0232 11/15/12 1254 10/18/12 2145  PROBNP 8676.0* 5763.0* 6325.0*   CBG:  Lab 11/17/12 1209 11/17/12 0805 11/16/12 2154 11/16/12 1603 11/16/12 1149  GLUCAP 204* 82 242* 187* 182*    Recent Results (from the past 240 hour(s))  MRSA PCR SCREENING     Status: Normal   Collection Time   11/15/12  8:03 PM      Component Value Range Status Comment   MRSA by PCR NEGATIVE  NEGATIVE Final      Studies: No results  found.  Scheduled Meds:    . aspirin EC  81 mg Oral Daily  . carvedilol  25 mg Oral BID WC  . clopidogrel  75 mg Oral Daily  . enoxaparin (LOVENOX) injection  40 mg Subcutaneous Q24H  . ferrous sulfate  325 mg Oral BID WC  . furosemide  40 mg Oral Daily  . insulin aspart  0-15 Units Subcutaneous TID WC  . insulin aspart  0-5 Units Subcutaneous QHS  . insulin glargine  25 Units Subcutaneous QHS  . ipratropium  0.5 mg Nebulization QID  . irbesartan  300 mg Oral QHS  . levofloxacin (LEVAQUIN) IV  500 mg Intravenous Q24H  . losartan  100 mg Oral Daily  . potassium chloride  20 mEq Oral BID  . simvastatin  40 mg Oral QPM  . sodium chloride  3 mL Intravenous Q12H   Continuous Infusions:   Active Problems:  Diabetes mellitus  COPD (chronic obstructive pulmonary disease)  SOB (shortness of breath)  Acute on chronic systolic heart failure    Time spent:     [facility]  [facility] Pager [phone]. If 8PM-8AM, please contact night-coverage at [email or web address], password [facility] 11/17/2012, 2:12 PM  LOS: 2 days

## 2012-11-17 NOTE — Consult Note (Signed)
Admit date: 11/15/2012 Referring Physician  : Dr. Blake Divine Primary Physician Cala Bradford, MD Primary Cardiologist  : Dr. Anne Fu Reason for Consultation  : Acute systolic heart failure  HPI: [name] is a 67 year old female with coronary artery disease   s/p MI 1997  s/p CABG (SVG to LAD, SVG to RCA-1994)  proximal ramus-2.75 x 23 mm Cypher 4/09, mid ramus-2.5 by 16 mm Taxus 5/06  Carotid stenosis, s/p carotid surgery, spprox 2006   PVD - right common iliac PCI, PTA-R SFA, right pop-tib peroneal bypass, L fem-pop bypass - Dr. Darrick Penna   who presented with increasing shortness of breath, pedal edema consistent with acute systolic heart failure. She was having difficulty breathing at home, orthopnea over the past few days. In December she was hospitalized for pneumonia but this was different, she was not having any fevers, chills, cough. After being admitted, she was placed on IV diuretics and she has diuresed approximately 4 L. Her edema is much improved as well as her orthopnea and shortness of breath. She feels much better. An echocardiogram was obtained during this hospitalization and showed ejection fraction in the 25% range. This seems to be slightly worse than her prior echocardiogram here a little over a year ago. In our office setting, I believe that her EF at one point was approximately 40%. She is a history of noncompliance in the past.  PMH:   Past Medical History  Diagnosis Date  . Diabetes mellitus     on Lantus 30 U   . CAD (coronary artery disease)     S/p CABG and stenting  . PAD (peripheral artery disease)   . Stroke     MRI 11/2011 with remote occipital lobe. MRA with moderate left focal vertebral artery stenosis  . CHF (congestive heart failure) 11/2011    Echo with EF 30-35%, global hypokinesis, and inferior akinesis  . Hyperlipidemia   . DDD (degenerative disc disease), lumbar   . CVA (cerebral vascular accident) 11/2010  . MI (myocardial infarction) 1997  . COPD  (chronic obstructive pulmonary disease)   . Anemia   . Chronic kidney disease   . Irregular heart beat   . Hypertension   . GERD (gastroesophageal reflux disease)   . History of IBS   . Hypothyroidism     Goiter  . Thyroid disease   . Carotid artery occlusion     PSH:   Past Surgical History  Procedure Date  . Ptca   . Thyroidectomy   . Coronary artery bypass graft     2 vessel  . Carotid endarterectomy ~2008    Left   . Cholecystectomy   . Tracheostomy tube placement 01/02/2012  . Angioplasty [id number]    Aortogram by Dr. [place] McKenzie [facility])  . Pr vein bypass graft,aorto-fem-pop     Right common femoral-AK popliteal BPG & Right Popliteal-posterior tibial  . Pr vein bypass graft,aorto-fem-pop     Left Fem-pop BPG   Allergies:  Crestor; Lisinopril; and Vicodin Prior to Admit Meds:   Prescriptions prior to admission  Medication Sig Dispense Refill  . acetaminophen (TYLENOL) 500 MG tablet Take 500 mg by mouth every 6 (six) hours as needed. For pain      . albuterol (PROVENTIL) (2.5 MG/3ML) 0.083% nebulizer solution Take 2.5 mg by nebulization 4 (four) times daily.      Marland Kitchen aspirin EC 81 MG tablet Take 81 mg by mouth daily.      . carvedilol (COREG) 25 MG tablet Take 25  mg by mouth 2 (two) times daily with a meal.      . clopidogrel (PLAVIX) 75 MG tablet Take 75 mg by mouth daily.      . ferrous fumarate (HEMOCYTE - 106 MG FE) 325 (106 FE) MG TABS Take 1 tablet (106 mg of iron total) by mouth 2 (two) times daily.  60 each  0  . furosemide (LASIX) 40 MG tablet Take 40 mg by mouth daily. May take 1 additional dose for a weight gain of 2 lbs      . guaiFENesin (MUCINEX) 600 MG 12 hr tablet Take 1 tablet (600 mg total) by mouth 2 (two) times daily as needed.  60 tablet  0  . insulin aspart (NOVOLOG) 100 UNIT/ML injection Inject 2-10 Units into the skin 3 (three) times daily before meals. Sliding scale as directed      . insulin glargine (LANTUS) 100 UNIT/ML injection Inject  22 Units into the skin at bedtime.      Marland Kitchen ipratropium (ATROVENT) 0.02 % nebulizer solution Take 0.5 mg by nebulization 4 (four) times daily.      . irbesartan (AVAPRO) 300 MG tablet Take 300 mg by mouth at bedtime.      Marland Kitchen loperamide (IMODIUM) 2 MG capsule Take 4 mg by mouth 4 (four) times daily as needed. For diarrhea      . losartan (COZAAR) 100 MG tablet Take 100 mg by mouth daily. Hold if SBP < 110: Hold if HR < 60      . nitroGLYCERIN (NITROSTAT) 0.4 MG SL tablet Place 0.4 mg under the tongue every 5 (five) minutes as needed. For chest pain      . potassium chloride (K-DUR) 10 MEQ tablet Take 2 tablets (20 mEq total) by mouth 2 (two) times daily.  60 tablet  0  . simvastatin (ZOCOR) 40 MG tablet Take 40 mg by mouth every evening.       . traMADol (ULTRAM) 50 MG tablet Take 50 mg by mouth every 6 (six) hours as needed. For pain       Fam HX:    Family History  Problem Relation Age of Onset  . Hyperlipidemia Mother   . Other Mother     AAA  . Diabetes Daughter    Social HX:    History   Social History  . Marital Status: Single    Spouse Name: N/A    Number of Children: 3  . Years of Education: N/A   Occupational History  . Retired    Social History Main Topics  . Smoking status: Former Smoker -- 1.0 packs/day for 50 years    Types: Cigarettes    Quit date: 01/24/2011  . Smokeless tobacco: Never Used  . Alcohol Use: No  . Drug Use: No  . Sexually Active: No   Other Topics Concern  . Not on file   Social History Narrative   Retired Advertising account executive.  Worked at the [employer] on [employer].  Formerly lived in [place] area.  2 daughters, Living here with her daughter in [place] starting end of January 2013. [name] [phone] [id number]. Ambulatory previously with a cane, but none now.     ROS:  Denies any fevers, chills. Positive for orthopnea, edema, shortness of breath. Denies chest pain. All 11 ROS were addressed and are negative except what is stated in the  HPI  Physical Exam: Blood pressure 132/56, pulse 64, temperature 97.5 F (36.4 C), temperature source Oral, resp. rate 23,  height 5\' 2"  (1.575 m), weight 71.5 kg (157 lb 10.1 oz), SpO2 96.00%.    General: Well developed, well nourished, in no acute distress Head: Eyes PERRLA, No xanthomas.   Normal cephalic and atramatic. Tracheostomy in place.  Lungs:   Clear bilaterally to auscultation and percussion. Normal respiratory effort. No wheezes, no rales. Heart:   HRRR S1 S2 Pulses are 2+ & equal. No significant murmur appreciated, minimal JVD,  No carotid bruit.   No abdominal bruits.  Abdomen: Bowel sounds are positive, abdomen soft and non-tender without masses. No hepatosplenomegaly. Msk:  Back normal. Normal strength and tone for age. Extremities:   No clubbing, cyanosis or edema.  DP +1 Neuro: Alert and oriented X 3, non-focal, MAE x 4 GU: Deferred Rectal: Deferred Psych:  Good affect, responds appropriately    Labs:   Lab Results  Component Value Date   WBC 6.8 11/15/2012   HGB 10.2* 11/15/2012   HCT 31.9* 11/15/2012   MCV 90.4 11/15/2012   PLT 195 11/15/2012    Lab 11/17/12 0510  NA 142  K 3.8  CL 104  CO2 30  BUN 22  CREATININE 1.29*  CALCIUM 8.8  PROT --  BILITOT --  ALKPHOS --  ALT --  AST --  GLUCOSE 120*   No results found for this basename: PTT   Lab Results  Component Value Date   INR 1.06 12/15/2011   Lab Results  Component Value Date   CKTOTAL 67 12/02/2011   CKMB 4.8* 12/02/2011   TROPONINI <0.30 11/16/2012     Lab Results  Component Value Date   CHOL 247* 11/27/2011   Lab Results  Component Value Date   HDL 37* 11/27/2011   Lab Results  Component Value Date   LDLCALC 171* 11/27/2011   Lab Results  Component Value Date   TRIG 194* 11/27/2011   Lab Results  Component Value Date   CHOLHDL 6.7 11/27/2011   No results found for this basename: LDLDIRECT     BNP 8600. Troponin negative. TSH 6.1. Potassium 3.8. Creatinine 1.29 from 0.9. Chest x-ray  from today demonstrates no pleural effusion, positive cardiomegaly, mild interstitial prominence. Personally viewed.  EKG:  2 separate EKGs demonstrate sinus rhythm with nonspecific T-wave changes, T-wave inversion in the lateral precordial leads specifically. Personally viewed.   Echocardiogram: 11/18/11 - Left ventricle: The cavity size was mildly dilated. Systolic function was severely reduced. The estimated ejection fraction was in the range of 25% to 30%. There is akinesis of the basal-mid inferior myocardium with otherwise generalized severe hypokinesis. Features are consistent with a pseudonormal left ventricular filling pattern, with concomitant abnormal relaxation and increased filling pressure (grade 2 diastolic dysfunction). - Mitral valve: Calcified annulus. Mild regurgitation. - Left atrium: The atrium was moderately dilated. - Right ventricle: The cavity size was mildly dilated. Wall thickness was normal. Systolic function was mildly reduced. - Right atrium: The atrium was mildly dilated. - Pulmonary arteries: Systolic pressure was moderately increased. PA peak pressure: 48mm Hg (S).   ASSESSMENT/PLAN:   67 year old female with chronic tracheostomy, severe coronary artery disease status post bypass surgery in 1994 with subsequent stent placements as noted above, peripheral vascular disease with both carotid as well as lower extremity arterial disease who was admitted with acute on chronic systolic heart failure, worsening ejection fraction.  1. Acute on chronic systolic heart failure-ejection fraction is slightly worse than previously noted in the 25-30% range. She clearly had an exacerbation of her heart failure. She was battling  orthopnea prior to admission. After successful diuresis here, she does feel much better. Creatinine is slightly elevated however I will tolerate this mild increase. Troponin is reassuring. At home she has been taking furosemide 40 mg once a day. I  would like her to take 40 mg twice a day for the time being with close followup. We will monitor her basic metabolic profile. I will change order from 40 mg once a day. I agree with continued use of max/cold his carvedilol as well as losartan. Her blood pressure at times remains elevated. I will also add spironolactone 25 mg once a day. We will monitor for signs of hyperkalemia. She may not need as much potassium supplementation.  I had a discussion with her today about the possibility of defibrillator. I explained the rationale. I explained the possibility of sudden death with decreased ejection fraction. We will continue to have discussion as outpatient. I also discussed with her that her ejection fraction is slightly reduced. We'll continue to monitor her clinically. If necessary, we could consider repeat heart catheterization to check for any signs of worsening arterial disease. At this point, I would continue with medical management. She's not having any anginal symptoms.  2. Peripheral vascular disease-continue to encourage exercise. Aspirin. Plavix.  3. Hyperlipidemia -- continue with simvastatin.  4. Diabetes-per primary team.  If she continues to show good improvement, I'm comfortable with her discharge tomorrow with close followup. Discussed with Dr. Blake Divine.  Donato Schultz, MD  11/17/2012  3:41 PM

## 2012-11-18 LAB — BASIC METABOLIC PANEL
Calcium: 9.4 mg/dL (ref 8.4–10.5)
GFR calc Af Amer: 43 mL/min — ABNORMAL LOW (ref >90)
GFR calc non Af Amer: 37 mL/min — ABNORMAL LOW (ref >90)
Potassium: 4 mEq/L (ref 3.5–5.1)
Sodium: 139 mEq/L (ref 135–145)

## 2012-11-18 LAB — GLUCOSE, CAPILLARY
Glucose-Capillary: 115 mg/dL — ABNORMAL HIGH (ref 70–99)
Glucose-Capillary: 246 mg/dL — ABNORMAL HIGH (ref 70–99)

## 2012-11-18 MED ORDER — LOSARTAN POTASSIUM 100 MG PO TABS: 100.0000 mg | ORAL_TABLET | Freq: Every day | ORAL | Status: DC

## 2012-11-18 MED ORDER — FUROSEMIDE 40 MG PO TABS: 40.0000 mg | ORAL_TABLET | Freq: Every day | ORAL | Status: DC

## 2012-11-18 MED ORDER — SPIRONOLACTONE 25 MG PO TABS: 25.0000 mg | ORAL_TABLET | Freq: Every day | ORAL | Status: DC

## 2012-11-18 NOTE — Progress Notes (Signed)
Pt d/c home to care of daughter. Education (CHF) booklet given and all questions answered.

## 2012-11-18 NOTE — Evaluation (Signed)
Physical Therapy Evaluation Patient Details Name: [name] MRN: [id number] DOB: 1946/05/26 Today's Date: 11/18/2012 Time: 1191-4782 PT Time Calculation (min): 15 min  PT Assessment / Plan / Recommendation Clinical Impression  Patient is a 67 yo female admitted with uncontrolled DM, CHF with EF 25%.  Patient also with chronic back pain radiating into bil. LE's.  Patient did well with mobility today.  Will benefit from acute PT to maximize independence prior to discharge home with daughter.  Recommend RW and tub seat for home.  Do not anticipate any f/u PT needs.    PT Assessment  Patient needs continued PT services    Follow Up Recommendations  No PT follow up;Supervision - Intermittent    Does the patient have the potential to tolerate intense rehabilitation      Barriers to Discharge None      Equipment Recommendations  Rolling walker with 5" wheels; Tub seat with back   Recommendations for Other Services     Frequency Min 3X/week    Precautions / Restrictions Precautions Precautions: Fall Restrictions Weight Bearing Restrictions: No   Pertinent Vitals/Pain Pain 8/10 in low back/legs with increased ambulation.  Chronic issue.      Mobility  Bed Mobility Bed Mobility: Not assessed Transfers Transfers: Sit to Stand;Stand to Sit Sit to Stand: 4: Min guard;With upper extremity assist;With armrests;From chair/3-in-1 Stand to Sit: 4: Min guard;With upper extremity assist;With armrests;To chair/3-in-1 Details for Transfer Assistance: Verbal cues for hand placement Ambulation/Gait Ambulation/Gait Assistance: 5: Supervision Ambulation Distance (Feet): 240 Feet Assistive device: Rolling walker Ambulation/Gait Assistance Details: Verbal cues for safe use of RW.  Good balance/posture.  Patient reports knees feel "weak" with pain in back/legs.  No buckling or loss of balance with gait with RW. Gait Pattern: Step-through pattern;Decreased stride length Gait velocity: Slow  gait speed           PT Diagnosis: Abnormality of gait;Generalized weakness;Acute pain  PT Problem List: Decreased strength;Decreased activity tolerance;Decreased mobility;Decreased knowledge of use of DME;Pain PT Treatment Interventions: DME instruction;Gait training;Functional mobility training;Patient/family education   PT Goals Acute Rehab PT Goals PT Goal Formulation: With patient Time For Goal Achievement: 11/25/12 Potential to Achieve Goals: Good Pt will go Sit to Stand: with modified independence;with upper extremity assist PT Goal: Sit to Stand - Progress: Goal set today Pt will Ambulate: >150 feet;with modified independence;with least restrictive assistive device PT Goal: Ambulate - Progress: Goal set today  Visit Information  Last PT Received On: 11/18/12 Assistance Needed: +1    Subjective Data  Subjective: "I only use my cane outside." Patient Stated Goal: To return home   Prior Functioning  Home Living Lives With: Daughter Available Help at Discharge: Family Type of Home: Apartment Home Access: Level entry Home Layout: One level Bathroom Shower/Tub: Engineer, manufacturing systems: Standard Bathroom Accessibility: Yes How Accessible: Accessible via walker Home Adaptive Equipment: Straight cane (Oxygen) Prior Function Level of Independence: Independent with assistive device(s);Needs assistance Needs Assistance: Bathing;Meal Prep;Light Housekeeping Bath: Minimal Meal Prep: Moderate Light Housekeeping: Moderate Able to Take Stairs?: Yes (gets SOB with a flight of stairs) Driving: No 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)    Cognition  Overall Cognitive Status: Appears within functional limits for tasks assessed/performed Arousal/Alertness: Awake/alert Orientation Level: Oriented X4 / Intact Behavior During Session: [facility] for tasks performed    Extremity/Trunk Assessment Right Upper Extremity  Assessment RUE ROM/Strength/Tone: WFL for tasks assessed RUE Sensation: WFL - Light Touch Left Upper Extremity Assessment LUE ROM/Strength/Tone:  WFL for tasks assessed LUE Sensation: WFL - Light Touch Right Lower Extremity Assessment RLE ROM/Strength/Tone: Deficits RLE ROM/Strength/Tone Deficits: General weakness 4/5 RLE Sensation: WFL - Light Touch RLE Coordination: WFL - gross motor (Slow with heel-to-shin) Left Lower Extremity Assessment LLE ROM/Strength/Tone: Deficits LLE ROM/Strength/Tone Deficits: General weakness 4/5 LLE Sensation: WFL - Light Touch LLE Coordination: WFL - gross motor (Slow with heel-to-shin) Trunk Assessment Trunk Assessment: Normal   Balance Balance Balance Assessed: Yes Dynamic Standing Balance Dynamic Standing - Balance Support: No upper extremity supported;During functional activity Dynamic Standing - Level of Assistance: 5: Stand by assistance Dynamic Standing - Balance Activities: Forward lean/weight shifting;Reaching for objects;Reaching across midline Dynamic Standing - Comments: Good balance during ADL activities.  End of Session PT - End of Session Equipment Utilized During Treatment: Gait belt Activity Tolerance: Patient tolerated treatment well;Patient limited by pain Patient left: in chair;with call bell/phone within reach Nurse Communication: Mobility status  GP     Vena Austria 11/18/2012, 11:48 AM [name]. [name], [facility] Acute Rehab Services Pager [phone]

## 2012-11-18 NOTE — Progress Notes (Signed)
Subjective:  Feels better. Eager to go home if possible. No edema. No SOB.   Objective:  Vital Signs in the last 24 hours: Temp:  [97.5 F (36.4 C)-98.7 F (37.1 C)] 98.7 F (37.1 C) (01/26 0800) Pulse Rate:  [53-83] 67  (01/26 0826) Resp:  [15-26] 19  (01/26 0826) BP: (132-165)/(52-77) 137/65 mmHg (01/26 0800) SpO2:  [91 %-100 %] 95 % (01/26 0900) FiO2 (%):  [21 %-28 %] 21 % (01/26 0826) Weight:  [72 kg (158 lb 11.7 oz)] 72 kg (158 lb 11.7 oz) (01/26 0018)  Intake/Output from previous day: 01/25 0701 - 01/26 0700 In: 987 [P.O.:980; I.V.:3; IV Piggyback:4] Out: 1850 [Urine:1850]   Physical Exam: General: Well developed, well nourished, in no acute distress. Head:  Normocephalic and atraumatic. Trach.  Lungs: Minimal crackles at L base. Heart: Normal S1 and S2.  No murmur, rubs or gallops. No JVD Abdomen: soft, non-tender, positive bowel sounds. Extremities: No clubbing or cyanosis. No edema. Neurologic: Alert and oriented x 3.    Lab Results:  [facility] 11/15/12 2109 11/15/12 1254  WBC 6.8 6.7  HGB 10.2* 10.1*  PLT 195 197    Basename 11/18/12 0535 11/17/12 0510  NA 139 142  K 4.0 3.8  CL 101 104  CO2 29 30  GLUCOSE 115* 120*  BUN 31* 22  CREATININE 1.43* 1.29*    Basename 11/16/12 0929 11/16/12 0232  TROPONINI <0.30 <0.30   No results found for this basename: PROTIME in the last 72 hours  Imaging: Dg Chest 2 View  11/17/2012  *RADIOLOGY REPORT*  Clinical Data: 67 year old female with resolving pneumonia.  CHEST - 2 VIEW  Comparison: 11/15/2012 and earlier.  Findings: Tracheostomy tube remains in place. Stable cardiomegaly and mediastinal contours.  No pneumothorax.  No pulmonary edema or pleural effusions.  Decreasing bilateral interstitial and more confluent basilar opacity.  Ventilation also improved compared to 10/18/2012.  Moderate to severe compression fracture at the thoracolumbar junction level re-identified. No acute osseous abnormality identified.   IMPRESSION: Decreasing bilateral pulmonary opacity with no areas of worsening ventilation.  Stable tracheostomy and cardiomegaly.   Original Report Authenticated By: Erskine Speed, M.D.    Personally viewed.   Telemetry: SR, PVC's, no VT Personally viewed.   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   1. Acute on chronic systolic heart failure - creat slightly elevated today. I changed her back to lasix 40mg  PO QD as she was on at home. Yesterday I had tried 40 BID. With her mild increase in creatinine (1.29 to 1.43), I will keep her at 40 QD. I also started spironolactone 25mg  PO QD yesterday. OK with continuing this at discharge.  K this am 4.0. Her edema has resolved. Breathing improved. I am OK with discharge with early follow up. I will set up appt to see her Tuesday or Wed with BMET to monitor renal function.   2. Ischemic cardiomyopathy - EF 25% - will continue to discuss ICD with her. EF is slighly lower that previous. Medical management for now. Could contemplate cath  in future (careful with creatinine). Yesterday discussed risk of sudden death.   3. CAD/PVD - ASA, PLAVIX. Bb, ARB     SKAINS, MARK 11/18/2012, 9:45 AM

## 2012-11-23 NOTE — Discharge Summary (Signed)
Physician Discharge Summary  [name] GNF:[id number] DOB: 02-23-1946 DOA: 11/15/2012  PCP: Cala Bradford, MD  Admit date: 11/15/2012 Discharge date: 11/18/2012   Recommendations for Outpatient Follow-up:  1. Follow up with Cardiologist as recommended  Discharge Diagnoses:  Active Problems:  Diabetes mellitus  COPD (chronic obstructive pulmonary disease)  SOB (shortness of breath)  Acute on chronic systolic heart failure   Discharge Condition: stable  Diet recommendation: low carb diet  Filed Weights   11/15/12 1915 11/17/12 0426 11/18/12 0018  Weight: 71.5 kg (157 lb 10.1 oz) 71.5 kg (157 lb 10.1 oz) 72 kg (158 lb 11.7 oz)    History of present illness:  [name] is a 67 y.o. female with prior h/o chronic systolic heart failure, copd, s/p tracheostomyin the past with trach collar on 3 lit of oxygen comes in for sob since yesterday, associated with orthopnea and PND, worsening pedal edema, worsening cough with pink sputum, denies chest pain, diaphoresis, syncopal episodes or palpitations. On arrival to ED, she was found to have pulmonary edema on CXR, sitting up on the bed comfortable on 3 lit of trach oxygen. She was given a dose of lasix and we were called to admit for further evaluation and management of acute on chronic systolic heart failure.    Hospital Course:  1. AAcute on chronic systolic heart failure:  - admitted to step down for trach care  - started patient on IV lasix 40 mg Q12hr. , She has diuresed > 4lit , and her creatinine has increased to 1.2. We have changed her iv lasix to po lasix on 1/25.  - potassium supplementation as needed. - I/O last 3 completed shifts:  In: 752 [P.O.:640; IV Piggyback:112]  Out: 4050 [Urine:4050]  Total I/O  In: 524 [P.O.:520; IV Piggyback:4]  Out: 1000 [Urine:1000]  - serial enzymes are negative and a echocardiogram is done and results show worsening of the systolic function. - repeat CXR  Show improving aeration. -  on coreg, 2 ARB's. Last echo an year ago showed LVEF of 30 to 35% with grade 1 diastolic dysfunction.  - as per the patient she is compliant to her medications and has been avoiding salt.  - cardiology consultation called from [facility] cardiology..  2. Diabetes mellitus: resume home medications and SSI.  CBG (last 3)   Basename  11/17/12 1209  11/17/12 0805  11/16/12 2154   GLUCAP  204*  82  242*    3. Hypertension: sub optimally controlled. Resume home medications.  4. COPD: She is currently not wheezing, hence no indication of steroids. Continue with nebs as needed.  5. S/p tracheostomy; trach care as per nursing with appropriate suctiioning.  6/ worsening cough with increased sputum: will treat her with levaquin for possible bronchitis for 3 days.  7. Anemia: normocytic.and stable.    Procedures:  none  Consultations: cardiology Discharge Exam: Filed Vitals:   11/18/12 0800 11/18/12 0826 11/18/12 0900 11/18/12 1144  BP: 137/65     Pulse: 53 67  71  Temp: 98.7 F (37.1 C)     TempSrc: Oral     Resp: 18 19  18   Height:      Weight:      SpO2: 91% 100% 95% 100%   General: Alert afebrile comfortable  Cardiovascular: s1s2  Respiratory: CTAB, no wheezing or rhonchi  Abdomen: soft NT ND BS+    Discharge Instructions  Discharge Orders    Future Orders Please Complete By Expires   Diet - low sodium  heart healthy      Discharge instructions      Comments:   Follow up with PCP and cardiologist as recommended. Please obtain a BMP to evaluate renal function in one week.   Activity as tolerated - No restrictions      (HEART FAILURE PATIENTS) Call MD:  Anytime you have any of the following symptoms: 1) 3 pound weight gain in 24 hours or 5 pounds in 1 week 2) shortness of breath, with or without a dry hacking cough 3) swelling in the hands, feet or stomach 4) if you have to sleep on extra pillows at night in order to breathe.      Call MD for:  persistant dizziness or  light-headedness      Call MD for:  difficulty breathing, headache or visual disturbances          Medication List     As of 11/23/2012  9:21 PM    STOP taking these medications         potassium chloride 10 MEQ tablet   Commonly known as: K-DUR      traMADol 50 MG tablet   Commonly known as: ULTRAM      TAKE these medications         acetaminophen 500 MG tablet   Commonly known as: TYLENOL   Take 500 mg by mouth every 6 (six) hours as needed. For pain      albuterol (2.5 MG/3ML) 0.083% nebulizer solution   Commonly known as: PROVENTIL   Take 2.5 mg by nebulization 4 (four) times daily.      aspirin EC 81 MG tablet   Take 81 mg by mouth daily.      carvedilol 25 MG tablet   Commonly known as: COREG   Take 25 mg by mouth 2 (two) times daily with a meal.      clopidogrel 75 MG tablet   Commonly known as: PLAVIX   Take 75 mg by mouth daily.      ferrous fumarate 325 (106 FE) MG Tabs   Commonly known as: HEMOCYTE - 106 mg FE   Take 1 tablet (106 mg of iron total) by mouth 2 (two) times daily.      furosemide 40 MG tablet   Commonly known as: LASIX   Take 40 mg by mouth daily. May take 1 additional dose for a weight gain of 2 lbs      guaiFENesin 600 MG 12 hr tablet   Commonly known as: MUCINEX   Take 1 tablet (600 mg total) by mouth 2 (two) times daily as needed.      insulin aspart 100 UNIT/ML injection   Commonly known as: novoLOG   Inject 2-10 Units into the skin 3 (three) times daily before meals. Sliding scale as directed      insulin glargine 100 UNIT/ML injection   Commonly known as: LANTUS   Inject 22 Units into the skin at bedtime.      ipratropium 0.02 % nebulizer solution   Commonly known as: ATROVENT   Take 0.5 mg by nebulization 4 (four) times daily.      irbesartan 300 MG tablet   Commonly known as: AVAPRO   Take 300 mg by mouth at bedtime.      loperamide 2 MG capsule   Commonly known as: IMODIUM   Take 4 mg by mouth 4 (four) times daily  as needed. For diarrhea      nitroGLYCERIN 0.4 MG SL tablet  Commonly known as: NITROSTAT   Place 0.4 mg under the tongue every 5 (five) minutes as needed. For chest pain      simvastatin 40 MG tablet   Commonly known as: ZOCOR   Take 40 mg by mouth every evening.      spironolactone 25 MG tablet   Commonly known as: ALDACTONE   Take 1 tablet (25 mg total) by mouth daily.           Follow-up Information    Follow up with Donato Schultz, MD. Call on 11/21/2012.   Contact information:   [address] [place] [address] [phone]       Follow up with Cala Bradford, MD. In 2 weeks.   Contact information:   [address] [place] [address] [phone]           The results of significant diagnostics from this hospitalization (including imaging, microbiology, ancillary and laboratory) are listed below for reference.    Significant Diagnostic Studies: Dg Chest 2 View  11/17/2012  *RADIOLOGY REPORT*  Clinical Data: 67 year old female with resolving pneumonia.  CHEST - 2 VIEW  Comparison: 11/15/2012 and earlier.  Findings: Tracheostomy tube remains in place. Stable cardiomegaly and mediastinal contours.  No pneumothorax.  No pulmonary edema or pleural effusions.  Decreasing bilateral interstitial and more confluent basilar opacity.  Ventilation also improved compared to 10/18/2012.  Moderate to severe compression fracture at the thoracolumbar junction level re-identified. No acute osseous abnormality identified.  IMPRESSION: Decreasing bilateral pulmonary opacity with no areas of worsening ventilation.  Stable tracheostomy and cardiomegaly.   Original Report Authenticated By: Erskine Speed, M.D.    Dg Chest Portable 1 View  11/15/2012  *RADIOLOGY REPORT*  Clinical Data: Shortness of breath  PORTABLE CHEST - 1 VIEW  Comparison: 10/18/2012  Findings: Cardiomegaly.  Status post median sternotomy.  Stable tracheostomy tube position.  There is worsening in aeration  with central mild vascular congestion and mild interstitial prominence bilaterally highly suspicious for pulmonary edema.  Probable bilateral small pleural effusion with bilateral basilar atelectasis or infiltrate.  IMPRESSION: There is worsening in aeration with central mild vascular congestion and mild interstitial prominence bilaterally highly suspicious for pulmonary edema.  Probable bilateral small pleural effusion with bilateral basilar atelectasis or infiltrate.   Original Report Authenticated By: Natasha Mead, M.D.     Microbiology: Recent Results (from the past 240 hour(s))  MRSA PCR SCREENING     Status: Normal   Collection Time   11/15/12  8:03 PM      Component Value Range Status Comment   MRSA by PCR NEGATIVE  NEGATIVE Final      Labs: Basic Metabolic Panel:  Lab 11/18/12 2952 11/17/12 0510  NA 139 142  K 4.0 3.8  CL 101 104  CO2 29 30  GLUCOSE 115* 120*  BUN 31* 22  CREATININE 1.43* 1.29*  CALCIUM 9.4 8.8  MG -- --  PHOS -- --   Liver Function Tests: No results found for this basename: AST:5,ALT:5,ALKPHOS:5,BILITOT:5,PROT:5,ALBUMIN:5 in the last 168 hours No results found for this basename: LIPASE:5,AMYLASE:5 in the last 168 hours No results found for this basename: AMMONIA:5 in the last 168 hours CBC: No results found for this basename: WBC:5,NEUTROABS:5,HGB:5,HCT:5,MCV:5,PLT:5 in the last 168 hours Cardiac Enzymes: No results found for this basename: CKTOTAL:5,CKMB:5,CKMBINDEX:5,TROPONINI:5 in the last 168 hours BNP: BNP (last 3 results)  Basename 11/16/12 0232 11/15/12 1254 10/18/12 2145  PROBNP 8676.0* 5763.0* 6325.0*   CBG:  Lab 11/18/12 1202 11/18/12  0805 11/17/12 2117 11/17/12 1747 11/17/12 1209  GLUCAP 246* 115* 150* 142* 204*       Signed:  Hillel Card  [facility] 11/23/2012, 9:21 PM

## 2012-12-13 ENCOUNTER — Other Ambulatory Visit: Payer: Self-pay | Admitting: *Deleted

## 2012-12-14 ENCOUNTER — Telehealth: Payer: Self-pay | Admitting: Vascular Surgery

## 2012-12-14 NOTE — Telephone Encounter (Signed)
I called Ms Hoar at [facility] request to get patient rescheduled to see CEF for ABI, Carotid and OV. Back in 02/2012 pt was advised that she needed Arch/Carotid Angio and possible lower extremity angio- but according to Judys note, pt never followed up for scheduling procedure.  Per our conversation, patient does not wish to schedule an appointment at this time, she will call us when she is ready to proceed. I explained that she would need another office visit prior to scheduling procedure. Patient voiced understanding. dpm

## 2012-12-31 ENCOUNTER — Emergency Department (HOSPITAL_COMMUNITY): Payer: Medicare Other

## 2012-12-31 ENCOUNTER — Inpatient Hospital Stay (HOSPITAL_COMMUNITY): Payer: Medicare Other

## 2012-12-31 ENCOUNTER — Inpatient Hospital Stay (HOSPITAL_COMMUNITY)
Admission: EM | Admit: 2012-12-31 | Discharge: 2013-01-04 | DRG: 683 | Disposition: A | Discharge status: Home / Self Care | Payer: Medicare Other | Attending: Internal Medicine | Admitting: Internal Medicine

## 2012-12-31 ENCOUNTER — Encounter (HOSPITAL_COMMUNITY): Payer: Self-pay | Admitting: Emergency Medicine

## 2012-12-31 DIAGNOSIS — E119 Type 2 diabetes mellitus without complications: Secondary | ICD-10-CM

## 2012-12-31 DIAGNOSIS — I129 Hypertensive chronic kidney disease with stage 1 through stage 4 chronic kidney disease, or unspecified chronic kidney disease: Secondary | ICD-10-CM | POA: Diagnosis present

## 2012-12-31 DIAGNOSIS — E871 Hypo-osmolality and hyponatremia: Secondary | ICD-10-CM | POA: Diagnosis present

## 2012-12-31 DIAGNOSIS — I5032 Chronic diastolic (congestive) heart failure: Secondary | ICD-10-CM

## 2012-12-31 DIAGNOSIS — E875 Hyperkalemia: Secondary | ICD-10-CM

## 2012-12-31 DIAGNOSIS — E1165 Type 2 diabetes mellitus with hyperglycemia: Secondary | ICD-10-CM

## 2012-12-31 DIAGNOSIS — R112 Nausea with vomiting, unspecified: Secondary | ICD-10-CM

## 2012-12-31 DIAGNOSIS — D72829 Elevated white blood cell count, unspecified: Secondary | ICD-10-CM | POA: Diagnosis present

## 2012-12-31 DIAGNOSIS — J9610 Chronic respiratory failure, unspecified whether with hypoxia or hypercapnia: Secondary | ICD-10-CM | POA: Diagnosis present

## 2012-12-31 DIAGNOSIS — Z930 Tracheostomy status: Secondary | ICD-10-CM

## 2012-12-31 DIAGNOSIS — Z794 Long term (current) use of insulin: Secondary | ICD-10-CM

## 2012-12-31 DIAGNOSIS — I2589 Other forms of chronic ischemic heart disease: Secondary | ICD-10-CM | POA: Diagnosis present

## 2012-12-31 DIAGNOSIS — I2510 Atherosclerotic heart disease of native coronary artery without angina pectoris: Secondary | ICD-10-CM

## 2012-12-31 DIAGNOSIS — J4489 Other specified chronic obstructive pulmonary disease: Secondary | ICD-10-CM | POA: Diagnosis present

## 2012-12-31 DIAGNOSIS — I452 Bifascicular block: Secondary | ICD-10-CM | POA: Diagnosis present

## 2012-12-31 DIAGNOSIS — J386 Stenosis of larynx: Secondary | ICD-10-CM

## 2012-12-31 DIAGNOSIS — D649 Anemia, unspecified: Secondary | ICD-10-CM

## 2012-12-31 DIAGNOSIS — Z8673 Personal history of transient ischemic attack (TIA), and cerebral infarction without residual deficits: Secondary | ICD-10-CM

## 2012-12-31 DIAGNOSIS — I6529 Occlusion and stenosis of unspecified carotid artery: Secondary | ICD-10-CM | POA: Diagnosis present

## 2012-12-31 DIAGNOSIS — I5022 Chronic systolic (congestive) heart failure: Secondary | ICD-10-CM

## 2012-12-31 DIAGNOSIS — I252 Old myocardial infarction: Secondary | ICD-10-CM

## 2012-12-31 DIAGNOSIS — IMO0002 Reserved for concepts with insufficient information to code with codable children: Secondary | ICD-10-CM

## 2012-12-31 DIAGNOSIS — I639 Cerebral infarction, unspecified: Secondary | ICD-10-CM

## 2012-12-31 DIAGNOSIS — E860 Dehydration: Secondary | ICD-10-CM

## 2012-12-31 DIAGNOSIS — Z9861 Coronary angioplasty status: Secondary | ICD-10-CM

## 2012-12-31 DIAGNOSIS — Z23 Encounter for immunization: Secondary | ICD-10-CM

## 2012-12-31 DIAGNOSIS — E785 Hyperlipidemia, unspecified: Secondary | ICD-10-CM

## 2012-12-31 DIAGNOSIS — I509 Heart failure, unspecified: Secondary | ICD-10-CM | POA: Diagnosis present

## 2012-12-31 DIAGNOSIS — I10 Essential (primary) hypertension: Secondary | ICD-10-CM

## 2012-12-31 DIAGNOSIS — Z951 Presence of aortocoronary bypass graft: Secondary | ICD-10-CM

## 2012-12-31 DIAGNOSIS — Z7902 Long term (current) use of antithrombotics/antiplatelets: Secondary | ICD-10-CM

## 2012-12-31 DIAGNOSIS — I739 Peripheral vascular disease, unspecified: Secondary | ICD-10-CM

## 2012-12-31 DIAGNOSIS — I5042 Chronic combined systolic (congestive) and diastolic (congestive) heart failure: Secondary | ICD-10-CM | POA: Diagnosis present

## 2012-12-31 DIAGNOSIS — R0602 Shortness of breath: Secondary | ICD-10-CM

## 2012-12-31 DIAGNOSIS — R739 Hyperglycemia, unspecified: Secondary | ICD-10-CM

## 2012-12-31 DIAGNOSIS — E039 Hypothyroidism, unspecified: Secondary | ICD-10-CM | POA: Diagnosis present

## 2012-12-31 DIAGNOSIS — R197 Diarrhea, unspecified: Secondary | ICD-10-CM

## 2012-12-31 DIAGNOSIS — G629 Polyneuropathy, unspecified: Secondary | ICD-10-CM

## 2012-12-31 DIAGNOSIS — I5023 Acute on chronic systolic (congestive) heart failure: Secondary | ICD-10-CM

## 2012-12-31 DIAGNOSIS — Z8639 Personal history of other endocrine, nutritional and metabolic disease: Secondary | ICD-10-CM

## 2012-12-31 DIAGNOSIS — J189 Pneumonia, unspecified organism: Secondary | ICD-10-CM

## 2012-12-31 DIAGNOSIS — N179 Acute kidney failure, unspecified: Principal | ICD-10-CM

## 2012-12-31 DIAGNOSIS — Z79899 Other long term (current) drug therapy: Secondary | ICD-10-CM

## 2012-12-31 DIAGNOSIS — IMO0001 Reserved for inherently not codable concepts without codable children: Secondary | ICD-10-CM | POA: Diagnosis present

## 2012-12-31 DIAGNOSIS — J449 Chronic obstructive pulmonary disease, unspecified: Secondary | ICD-10-CM

## 2012-12-31 DIAGNOSIS — R9431 Abnormal electrocardiogram [ECG] [EKG]: Secondary | ICD-10-CM

## 2012-12-31 DIAGNOSIS — K219 Gastro-esophageal reflux disease without esophagitis: Secondary | ICD-10-CM | POA: Diagnosis present

## 2012-12-31 DIAGNOSIS — Z7982 Long term (current) use of aspirin: Secondary | ICD-10-CM

## 2012-12-31 DIAGNOSIS — N183 Chronic kidney disease, stage 3 unspecified: Secondary | ICD-10-CM | POA: Diagnosis present

## 2012-12-31 DIAGNOSIS — Z87891 Personal history of nicotine dependence: Secondary | ICD-10-CM

## 2012-12-31 LAB — BASIC METABOLIC PANEL
BUN: 78 mg/dL — ABNORMAL HIGH (ref 6–23)
CO2: 22 mEq/L (ref 19–32)
CO2: 22 mEq/L (ref 19–32)
Calcium: 9.4 mg/dL (ref 8.4–10.5)
Calcium: 9.9 mg/dL (ref 8.4–10.5)
Calcium: 9.7 mg/dL (ref 8.4–10.5)
Chloride: 99 mEq/L (ref 96–112)
Chloride: 108 mEq/L (ref 96–112)
Chloride: 101 mEq/L (ref 96–112)
Creatinine, Ser: 2.37 mg/dL — ABNORMAL HIGH (ref 0.50–1.10)
Creatinine, Ser: 2.71 mg/dL — ABNORMAL HIGH (ref 0.50–1.10)
GFR calc Af Amer: 23 mL/min — ABNORMAL LOW (ref >90)
GFR calc Af Amer: 20 mL/min — ABNORMAL LOW (ref >90)
GFR calc non Af Amer: 17 mL/min — ABNORMAL LOW (ref >90)
Glucose, Bld: 208 mg/dL — ABNORMAL HIGH (ref 70–99)
Glucose, Bld: 153 mg/dL — ABNORMAL HIGH (ref 70–99)
Potassium: 6.7 mEq/L (ref 3.5–5.1)
Potassium: 7.2 mEq/L (ref 3.5–5.1)
Sodium: 132 mEq/L — ABNORMAL LOW (ref 135–145)
Sodium: 140 mEq/L (ref 135–145)
Sodium: 145 mEq/L (ref 135–145)

## 2012-12-31 LAB — COMPREHENSIVE METABOLIC PANEL
Albumin: 4.4 g/dL (ref 3.5–5.2)
Alkaline Phosphatase: 108 U/L (ref 39–117)
BUN: 79 mg/dL — ABNORMAL HIGH (ref 6–23)
Calcium: 9.7 mg/dL (ref 8.4–10.5)
Creatinine, Ser: 2.53 mg/dL — ABNORMAL HIGH (ref 0.50–1.10)
GFR calc Af Amer: 22 mL/min — ABNORMAL LOW (ref >90)
Glucose, Bld: 327 mg/dL — ABNORMAL HIGH (ref 70–99)
Potassium: >7.5 mEq/L (ref 3.5–5.1)
Total Protein: 9 g/dL — ABNORMAL HIGH (ref 6.0–8.3)

## 2012-12-31 LAB — CBC WITH DIFFERENTIAL/PLATELET
Basophils Relative: 0 % (ref 0–1)
Eosinophils Absolute: 0.1 10*3/uL (ref 0.0–0.7)
Eosinophils Relative: 1 % (ref 0–5)
Hemoglobin: 12.7 g/dL (ref 12.0–15.0)
Lymphs Abs: 0.4 10*3/uL — ABNORMAL LOW (ref 0.7–4.0)
MCH: 29.5 pg (ref 26.0–34.0)
MCHC: 33.2 g/dL (ref 30.0–36.0)
MCV: 88.8 fL (ref 78.0–100.0)
Monocytes Absolute: 0.5 10*3/uL (ref 0.1–1.0)
Monocytes Relative: 3 % (ref 3–12)
Neutrophils Relative %: 94 % — ABNORMAL HIGH (ref 43–77)
RBC: 4.3 MIL/uL (ref 3.87–5.11)

## 2012-12-31 LAB — GLUCOSE, CAPILLARY: Glucose-Capillary: 300 mg/dL — ABNORMAL HIGH (ref 70–99)

## 2012-12-31 LAB — CBC
Hemoglobin: 11.4 g/dL — ABNORMAL LOW (ref 12.0–15.0)
RBC: 3.91 MIL/uL (ref 3.87–5.11)

## 2012-12-31 LAB — CREATININE, SERUM
Creatinine, Ser: 2.69 mg/dL — ABNORMAL HIGH (ref 0.50–1.10)
GFR calc non Af Amer: 17 mL/min — ABNORMAL LOW (ref >90)

## 2012-12-31 LAB — MRSA PCR SCREENING: MRSA by PCR: NEGATIVE

## 2012-12-31 LAB — PRO B NATRIURETIC PEPTIDE: Pro B Natriuretic peptide (BNP): 1860 pg/mL — ABNORMAL HIGH (ref 0–125)

## 2012-12-31 MED ORDER — SODIUM BICARBONATE 8.4 % IV SOLN
50.0000 meq | Freq: Once | INTRAVENOUS | Status: AC
  Administered 2012-12-31: 50 meq via INTRAVENOUS
  Filled 2012-12-31: qty 50

## 2012-12-31 MED ORDER — INSULIN REGULAR HUMAN 100 UNIT/ML IJ SOLN
10.0000 [IU] | Freq: Once | INTRAMUSCULAR | Status: DC
  Filled 2012-12-31: qty 0.1

## 2012-12-31 MED ORDER — ACETAMINOPHEN 650 MG RE SUPP: 650.0000 mg | Freq: Four times a day (QID) | RECTAL | Status: DC | PRN

## 2012-12-31 MED ORDER — ONDANSETRON HCL 4 MG PO TABS: 4.0000 mg | ORAL_TABLET | Freq: Four times a day (QID) | ORAL | Status: DC | PRN

## 2012-12-31 MED ORDER — SODIUM POLYSTYRENE SULFONATE 15 GM/60ML PO SUSP
15.0000 g | Freq: Once | ORAL | Status: AC
  Administered 2012-12-31: 15 g via ORAL

## 2012-12-31 MED ORDER — SODIUM CHLORIDE 0.9 % IV SOLN
INTRAVENOUS | Status: DC
  Administered 2012-12-31 – 2013-01-04 (×5): via INTRAVENOUS

## 2012-12-31 MED ORDER — INSULIN REGULAR HUMAN 100 UNIT/ML IJ SOLN: 5.0000 [IU] | Freq: Once | INTRAMUSCULAR | Status: DC

## 2012-12-31 MED ORDER — DEXTROSE 50 % IV SOLN
1.0000 | Freq: Once | INTRAVENOUS | Status: AC
  Administered 2012-12-31: 50 mL via INTRAVENOUS
  Filled 2012-12-31: qty 50

## 2012-12-31 MED ORDER — INSULIN ASPART 100 UNIT/ML ~~LOC~~ SOLN
0.0000 [IU] | Freq: Every day | SUBCUTANEOUS | Status: DC
  Administered 2012-12-31: 3 [IU] via SUBCUTANEOUS
  Filled 2012-12-31: qty 1

## 2012-12-31 MED ORDER — GUAIFENESIN ER 600 MG PO TB12
600.0000 mg | ORAL_TABLET | Freq: Two times a day (BID) | ORAL | Status: DC | PRN
  Filled 2012-12-31: qty 1

## 2012-12-31 MED ORDER — DEXTROSE 50 % IV SOLN: 25.0000 mL | Freq: Once | INTRAVENOUS | Status: DC

## 2012-12-31 MED ORDER — IPRATROPIUM BROMIDE 0.02 % IN SOLN: 0.5000 mg | RESPIRATORY_TRACT | Status: DC | PRN

## 2012-12-31 MED ORDER — INSULIN GLARGINE 100 UNIT/ML ~~LOC~~ SOLN
22.0000 [IU] | Freq: Every day | SUBCUTANEOUS | Status: DC
  Administered 2012-12-31 – 2013-01-03 (×4): 22 [IU] via SUBCUTANEOUS

## 2012-12-31 MED ORDER — ENOXAPARIN SODIUM 30 MG/0.3ML ~~LOC~~ SOLN
30.0000 mg | SUBCUTANEOUS | Status: DC
  Administered 2012-12-31 – 2013-01-02 (×3): 30 mg via SUBCUTANEOUS
  Filled 2012-12-31 (×4): qty 0.3

## 2012-12-31 MED ORDER — INSULIN ASPART 100 UNIT/ML IV SOLN
5.0000 [IU] | Freq: Once | INTRAVENOUS | Status: AC
  Administered 2012-12-31: 5 [IU] via INTRAVENOUS

## 2012-12-31 MED ORDER — INSULIN ASPART 100 UNIT/ML IV SOLN
10.0000 [IU] | Freq: Once | INTRAVENOUS | Status: AC
  Administered 2012-12-31: 10 [IU] via INTRAVENOUS
  Filled 2012-12-31: qty 0.1

## 2012-12-31 MED ORDER — CARVEDILOL 25 MG PO TABS
25.0000 mg | ORAL_TABLET | Freq: Two times a day (BID) | ORAL | Status: DC
  Administered 2013-01-01 – 2013-01-04 (×6): 25 mg via ORAL
  Filled 2012-12-31 (×8): qty 1

## 2012-12-31 MED ORDER — ONDANSETRON HCL 4 MG/2ML IJ SOLN: 4.0000 mg | Freq: Four times a day (QID) | INTRAMUSCULAR | Status: DC | PRN

## 2012-12-31 MED ORDER — CLOPIDOGREL BISULFATE 75 MG PO TABS
75.0000 mg | ORAL_TABLET | Freq: Every day | ORAL | Status: DC
Start: 2012-12-31 — End: 2013-01-04
  Administered 2012-12-31 – 2013-01-04 (×5): 75 mg via ORAL
  Filled 2012-12-31 (×5): qty 1

## 2012-12-31 MED ORDER — ONDANSETRON HCL 4 MG/2ML IJ SOLN
4.0000 mg | Freq: Once | INTRAMUSCULAR | Status: AC
  Administered 2012-12-31: 4 mg via INTRAVENOUS
  Filled 2012-12-31: qty 2

## 2012-12-31 MED ORDER — ACETAMINOPHEN 325 MG PO TABS: 650.0000 mg | ORAL_TABLET | Freq: Four times a day (QID) | ORAL | Status: DC | PRN

## 2012-12-31 MED ORDER — ALBUTEROL (5 MG/ML) CONTINUOUS INHALATION SOLN
INHALATION_SOLUTION | RESPIRATORY_TRACT | Status: AC
  Administered 2012-12-31: 10 mg via RESPIRATORY_TRACT
  Filled 2012-12-31: qty 20

## 2012-12-31 MED ORDER — SODIUM POLYSTYRENE SULFONATE 15 GM/60ML PO SUSP
15.0000 g | Freq: Once | ORAL | Status: AC
  Administered 2012-12-31: 15 g via ORAL
  Filled 2012-12-31: qty 120

## 2012-12-31 MED ORDER — SIMVASTATIN 40 MG PO TABS
40.0000 mg | ORAL_TABLET | Freq: Every evening | ORAL | Status: DC
  Administered 2012-12-31 – 2013-01-03 (×4): 40 mg via ORAL
  Filled 2012-12-31 (×5): qty 1

## 2012-12-31 MED ORDER — SODIUM CHLORIDE 0.9 % IV SOLN: INTRAVENOUS | Status: AC

## 2012-12-31 MED ORDER — DEXTROSE 50 % IV SOLN
25.0000 mL | Freq: Once | INTRAVENOUS | Status: AC
  Administered 2012-12-31: 25 mL via INTRAVENOUS
  Filled 2012-12-31: qty 50

## 2012-12-31 MED ORDER — SODIUM CHLORIDE 0.9 % IV SOLN
Freq: Once | INTRAVENOUS | Status: AC
  Administered 2012-12-31: 20 mL via INTRAVENOUS

## 2012-12-31 MED ORDER — SODIUM POLYSTYRENE SULFONATE 15 GM/60ML PO SUSP
30.0000 g | Freq: Once | ORAL | Status: AC
  Administered 2012-12-31: 30 g via ORAL
  Filled 2012-12-31: qty 120

## 2012-12-31 MED ORDER — INSULIN ASPART 100 UNIT/ML ~~LOC~~ SOLN
10.0000 [IU] | Freq: Once | SUBCUTANEOUS | Status: AC
  Administered 2012-12-31: 10 [IU] via SUBCUTANEOUS
  Filled 2012-12-31: qty 1

## 2012-12-31 MED ORDER — ASPIRIN EC 81 MG PO TBEC
81.0000 mg | DELAYED_RELEASE_TABLET | Freq: Every day | ORAL | Status: DC
Start: 2012-12-31 — End: 2013-01-04
  Administered 2012-12-31 – 2013-01-04 (×5): 81 mg via ORAL
  Filled 2012-12-31 (×5): qty 1

## 2012-12-31 MED ORDER — SODIUM CHLORIDE 0.9 % IV SOLN
1.0000 g | Freq: Once | INTRAVENOUS | Status: AC
  Administered 2012-12-31: 1 g via INTRAVENOUS
  Filled 2012-12-31: qty 10

## 2012-12-31 MED ORDER — SODIUM POLYSTYRENE SULFONATE 15 GM/60ML PO SUSP
60.0000 g | Freq: Once | ORAL | Status: AC
  Administered 2012-12-31: 60 g via ORAL
  Filled 2012-12-31 (×2): qty 120

## 2012-12-31 MED ORDER — FERROUS FUMARATE 325 (106 FE) MG PO TABS
1.0000 | ORAL_TABLET | Freq: Two times a day (BID) | ORAL | Status: DC
  Administered 2012-12-31 – 2013-01-04 (×8): 106 mg via ORAL
  Filled 2012-12-31 (×10): qty 1

## 2012-12-31 MED ORDER — ALBUTEROL SULFATE (5 MG/ML) 0.5% IN NEBU: 10.0000 mg | INHALATION_SOLUTION | Freq: Once | RESPIRATORY_TRACT | Status: AC

## 2012-12-31 MED ORDER — ALBUTEROL SULFATE (5 MG/ML) 0.5% IN NEBU
2.5000 mg | INHALATION_SOLUTION | RESPIRATORY_TRACT | Status: DC
  Administered 2012-12-31 – 2013-01-02 (×14): 2.5 mg via RESPIRATORY_TRACT
  Filled 2012-12-31 (×15): qty 0.5

## 2012-12-31 MED ORDER — SODIUM POLYSTYRENE SULFONATE 15 GM/60ML PO SUSP
60.0000 g | Freq: Once | ORAL | Status: AC
  Administered 2012-12-31: 60 g via ORAL
  Filled 2012-12-31: qty 240

## 2012-12-31 MED ORDER — SODIUM CHLORIDE 0.9 % IJ SOLN
3.0000 mL | Freq: Two times a day (BID) | INTRAMUSCULAR | Status: DC
  Administered 2012-12-31 – 2013-01-04 (×5): 3 mL via INTRAVENOUS

## 2012-12-31 MED ORDER — INSULIN ASPART 100 UNIT/ML ~~LOC~~ SOLN
0.0000 [IU] | Freq: Three times a day (TID) | SUBCUTANEOUS | Status: DC
  Administered 2013-01-01: 1 [IU] via SUBCUTANEOUS
  Administered 2013-01-01: 2 [IU] via SUBCUTANEOUS
  Administered 2013-01-01: 1 [IU] via SUBCUTANEOUS
  Administered 2013-01-02: 2 [IU] via SUBCUTANEOUS
  Administered 2013-01-02: 3 [IU] via SUBCUTANEOUS
  Administered 2013-01-03: 1 [IU] via SUBCUTANEOUS
  Administered 2013-01-03: 2 [IU] via SUBCUTANEOUS
  Administered 2013-01-03: 3 [IU] via SUBCUTANEOUS
  Administered 2013-01-04: 2 [IU] via SUBCUTANEOUS
  Filled 2012-12-31: qty 1

## 2012-12-31 MED ORDER — NITROGLYCERIN 0.4 MG SL SUBL: 0.4000 mg | SUBLINGUAL_TABLET | SUBLINGUAL | Status: DC | PRN

## 2012-12-31 MED ORDER — ALBUTEROL SULFATE (5 MG/ML) 0.5% IN NEBU: 2.5000 mg | INHALATION_SOLUTION | RESPIRATORY_TRACT | Status: DC | PRN

## 2012-12-31 NOTE — ED Notes (Signed)
Reported potassium of 7.5 to PA [employer]

## 2012-12-31 NOTE — Progress Notes (Signed)
Pt arrived on unit at approximately 1830.  Pt was alert and oriented , daughter at bedside.  Per the request of Glade Nurse MD [place]; nurse contacted MD Dederding to inform of persistent K level post interventions performed in ED.  MD instructed nurse to contact him once next results from blood draw during 1700 hour is resulted, nurse will communicate this request during report to next shift.

## 2012-12-31 NOTE — Consult Note (Signed)
I have personally seen and examined this patient and agree with the assessment/plan as outlined above by Dede Query MD (PGY2). 67 yo AAF with hyperkalemia and AKI from ongoing aldactone/ARB and subtle volume depletion. Ongoing aggressive medical management for hyperkalemia with associated EKG changes. Will monitor potassium levels closely and consider HD if potassium level does not drop. Agree with Dr. Anne Fu---- would not use aldactone on this patient in the future.   PATEL,JAY K.,MD 12/31/2012 1:05 PM

## 2012-12-31 NOTE — H&P (Signed)
[facility] History and Physical  [name] ZOX:[id number] DOB: 1946/03/16 DOA: 12/31/2012  Referring physician: none PCP: Cala Bradford, MD  Specialists: DR Anne Fu  Chief Complaint: nausea, diarrhea for 2 days.   HPI: [name] is a 67 y.o. female with h/o chronic systolic heart failure, CAD s/p CABG, CVA, DM , PVD, was brought in by her daughter for persistent diarrhea, and tingling and numbness of her hands this am. As per the patient, her daughter had GI sickness on Saturday and she started having nausea and diarrhea soon after. She denies any fever or chills. Her daughter at bedside reports tingling and numbness of her fingers this morning which seems to have improved. On arrival to ED, she was found to be in acute on chronic renal failure and hyperkalemic with EKG changes evident with QT prolongation, AV BLOCK, etc.  She is being admitted to medical service for further management.   Review of Systems: The patient denies anorexia, fever, weight loss,, vision loss, decreased hearing, hoarseness, chest pain, syncope, dyspnea on exertion, peripheral edema, balance deficits, hemoptysis, abdominal pain, melena, hematochezia, severe indigestion/heartburn, hematuria, incontinence, genital sores, muscle weakness, suspicious skin lesions, transient blindness, difficulty walking, depression, unusual weight change, abnormal bleeding, enlarged lymph nodes, angioedema, and breast masses.    Past Medical History  Diagnosis Date  . Diabetes mellitus     on Lantus 30 U   . CAD (coronary artery disease)     S/p CABG and stenting  . PAD (peripheral artery disease)   . Stroke     MRI 11/2011 with remote occipital lobe. MRA with moderate left focal vertebral artery stenosis  . CHF (congestive heart failure) 11/2011    Echo with EF 30-35%, global hypokinesis, and inferior akinesis  . Hyperlipidemia   . DDD (degenerative disc disease), lumbar   . CVA (cerebral vascular accident) 11/2010  .  MI (myocardial infarction) 1997  . COPD (chronic obstructive pulmonary disease)   . Anemia   . Chronic kidney disease   . Irregular heart beat   . Hypertension   . GERD (gastroesophageal reflux disease)   . History of IBS   . Hypothyroidism     Goiter  . Thyroid disease   . Carotid artery occlusion    Past Surgical History  Procedure Laterality Date  . Ptca    . Thyroidectomy    . Coronary artery bypass graft      2 vessel  . Carotid endarterectomy  ~2008    Left   . Cholecystectomy    . Tracheostomy tube placement  01/02/2012  . Angioplasty  [id number]    Aortogram by Dr. [place] McKenzie [facility])  . Pr vein bypass graft,aorto-fem-pop      Right common femoral-AK popliteal BPG & Right Popliteal-posterior tibial  . Pr vein bypass graft,aorto-fem-pop      Left Fem-pop BPG   Social History:  reports that she quit smoking about 23 months ago. Her smoking use included Cigarettes. She has a 50 pack-year smoking history. She has never used smokeless tobacco. She reports that she does not drink alcohol or use illicit drugs.  where does patient live--home,   Allergies  Allergen Reactions  . Crestor (Rosuvastatin Calcium) Other (See Comments)    Muscle Pain  . Lisinopril Other (See Comments)    "Lowers her BP too low" & cough  . Vicodin (Hydrocodone-Acetaminophen) Nausea And Vomiting    Family History  Problem Relation Age of Onset  . Hyperlipidemia Mother   . Other Mother  AAA  . Diabetes Daughter    Prior to Admission medications   Medication Sig Start Date End Date Taking? Authorizing Provider  aspirin EC 81 MG tablet Take 81 mg by mouth daily.   Yes Historical Provider, MD  carvedilol (COREG) 25 MG tablet Take 25 mg by mouth 2 (two) times daily with a meal. 11/28/11  Yes Danley Danker, MD  clopidogrel (PLAVIX) 75 MG tablet Take 75 mg by mouth daily. 11/28/11  Yes Danley Danker, MD  furosemide (LASIX) 40 MG tablet Take 40 mg by mouth daily. May take 1  additional dose for a weight gain of 2 lbs   Yes Historical Provider, MD  insulin aspart (NOVOLOG) 100 UNIT/ML injection Inject 2-10 Units into the skin 3 (three) times daily before meals. Sliding scale as directed   Yes Historical Provider, MD  insulin glargine (LANTUS) 100 UNIT/ML injection Inject 22 Units into the skin at bedtime.   Yes Historical Provider, MD  irbesartan (AVAPRO) 300 MG tablet Take 300 mg by mouth at bedtime.   Yes Historical Provider, MD  Multiple Vitamin (MULTIVITAMIN WITH MINERALS) TABS Take 1 tablet by mouth daily.   Yes Historical Provider, MD  simvastatin (ZOCOR) 40 MG tablet Take 40 mg by mouth every evening.  11/28/11  Yes Danley Danker, MD  spironolactone (ALDACTONE) 25 MG tablet Take 1 tablet (25 mg total) by mouth daily. 11/18/12  Yes Kathlen Mody, MD  traMADol (ULTRAM) 50 MG tablet Take 50 mg by mouth every 6 (six) hours as needed for pain.   Yes Historical Provider, MD  acetaminophen (TYLENOL) 500 MG tablet Take 500 mg by mouth every 6 (six) hours as needed. For pain    Historical Provider, MD  albuterol (PROVENTIL) (2.5 MG/3ML) 0.083% nebulizer solution Take 2.5 mg by nebulization 4 (four) times daily.    Historical Provider, MD  ferrous fumarate (HEMOCYTE - 106 MG FE) 325 (106 FE) MG TABS Take 1 tablet (106 mg of iron total) by mouth 2 (two) times daily. 10/23/12   Joseph Art, DO  guaiFENesin (MUCINEX) 600 MG 12 hr tablet Take 1 tablet (600 mg total) by mouth 2 (two) times daily as needed. 10/23/12   Joseph Art, DO  ipratropium (ATROVENT) 0.02 % nebulizer solution Take 0.5 mg by nebulization 4 (four) times daily. 11/28/11 11/27/12  Danley Danker, MD  loperamide (IMODIUM) 2 MG capsule Take 4 mg by mouth 4 (four) times daily as needed. For diarrhea    Historical Provider, MD  nitroGLYCERIN (NITROSTAT) 0.4 MG SL tablet Place 0.4 mg under the tongue every 5 (five) minutes as needed. For chest pain    Historical Provider, MD   Physical Exam: Filed Vitals:    12/31/12 0620 12/31/12 0815 12/31/12 0921  BP: 135/82 156/78 156/54  Pulse: 64 62 83  Temp: 98.2 F (36.8 C)    TempSrc: Oral    Resp: 18 16 18   SpO2: 95% 97% 98%    Constitutional: Vital signs reviewed.  Patient is a well-developed and well-nourished  in no acute distress and cooperative with exam. Alert and oriented x3.  Head: Normocephalic and atraumatic Mouth: no erythema or exudates, dry MM Eyes: PERRL, EOMI, conjunctivae normal, No scleral icterus.  Neck: Supple, Trachea midline normal ROM, No JVD, mass, thyromegaly, or carotid bruit present.  Cardiovascular: RRR, S1 normal, S2 normal, no MRG, pulses symmetric and intact bilaterally Pulmonary/Chest: CTAB, no wheezes, rales, or rhonchi Abdominal: Soft. Non-tender, non-distended, bowel sounds are normal, no masses, organomegaly,  or guarding present.  Musculoskeletal: No joint deformities, erythema, or stiffness, ROM full and no nontender Neurological: A&O x3, Strength is normal and symmetric bilaterally,no focal motor deficit, Skin: Warm, dry and intact. No rash, cyanosis, or clubbing.      Labs on Admission:  Basic Metabolic Panel:  Recent Labs Lab 12/31/12 0703 12/31/12 0835  NA 128* 132*  K >7.5* >7.5*  CL 95* 99  CO2 23 20  GLUCOSE 327* 320*  BUN 79* 80*  CREATININE 2.53* 2.37*  CALCIUM 9.7 9.4   Liver Function Tests:  Recent Labs Lab 12/31/12 0703  AST 45*  ALT 72*  ALKPHOS 108  BILITOT 0.3  PROT 9.0*  ALBUMIN 4.4   No results found for this basename: LIPASE, AMYLASE,  in the last 168 hours No results found for this basename: AMMONIA,  in the last 168 hours CBC:  Recent Labs Lab 12/31/12 0703  WBC 16.1*  NEUTROABS 15.1*  HGB 12.7  HCT 38.2  MCV 88.8  PLT 183   Cardiac Enzymes: No results found for this basename: CKTOTAL, CKMB, CKMBINDEX, TROPONINI,  in the last 168 hours  BNP (last 3 results)  Recent Labs  10/18/12 2145 11/15/12 1254 11/16/12 0232  PROBNP 6325.0* 5763.0* 8676.0*    CBG:  Recent Labs Lab 12/31/12 0833  GLUCAP 300*    Radiological Exams on Admission: Dg Chest 2 View  12/31/2012  *RADIOLOGY REPORT*  Clinical Data: Tracheostomy, cough  CHEST - 2 VIEW  Comparison: 11/17/2012; 11/15/2012  Findings: Grossly unchanged enlarged cardiac silhouette and mediastinal contours post median sternotomy. Post coronary artery stenting. Stable positioning of support apparatus.  Lung volumes remain reduced with grossly unchanged linear heterogeneous opacities within the left mid lung favored to represent atelectasis.  No new focal airspace opacities.  No definite evidence of pulmonary edema or pneumothorax.  Unchanged bones. Post cholecystectomy.  IMPRESSION: Persistently reduced lung volumes without acute cardiopulmonary disease.   Original Report Authenticated By: Tacey Ruiz, MD     EKG: sinus rhythm with qt prolongation.   Assessment/Plan Active Problems:    1. Hyperkalemia/ Acute on chronic renal failure stage 3: probably a combination of dehydration from diarrhea, with the use of ARB,lasix, and spironolactone and multivitamin with potassium supplements.  - admit to stepdown  - she was given two doses of po kayexalate of 30gms.  - a dose of calcium gluconate - IV bicarbonate, insulin and dextrose.  - US renal does not show any obstruction - UA is pending.  - gentle hydration with I/O's - daily weights.  - repeat K is persistently high with level greater than 7.5. We will repeat another level at 4 today, if it doesn't improve she will need HD.  - Renal consultation from Dr patel requested .  - abnormal EKG changes, requested Dr Anne Fu to assist [place] for recommendations.  - currently she denies any c hest pain or sob, and does'nt' appear decompensated.    2. 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 No sob, or orthopnea or PND or pedal edema.  - resume coreg and hold spironolactone and ARB, LASIX for today.   3. Mild  hyponatremia: probably from dehydration.   5. Nausea, vomiting and diarrhea: c diff pcr ordered. Stool culture ordered. Anti emetics, clear liquid diet and gentle hydration. She doesn't appear toxic. She is afebrile.   6. Leukocytosis: probably reactive. UA is pending. CXR does not show consolidation. Would not start her on empiric antibiotics yet.   7.  Diabetes Mellitus:  CBG (last 3)   Recent Labs  12/31/12 0833  GLUCAP 300*    Resume home medications and SSI.   8. Chronic Respiratory failure s/p trach on 3 lit of oxygen at home: trach care as per nursing and appropriate suctioning as needed.    9. COPD: STABLE , no wheeizng heard, nebs as needed.   Code Status: full code Family Communication: daughter at bedside  Disposition Plan:possibly in 2 to 3 days .    [facility] Pager [phone]  If 7PM-7AM, please contact night-coverage [email or web address] Password [facility] 12/31/2012, 10:38 AM

## 2012-12-31 NOTE — ED Notes (Signed)
Lab called Panic potassium greater than 7.5.

## 2012-12-31 NOTE — ED Notes (Signed)
PER EMS- Pt from home with n/v/d with generalized weakness throughout entire body. Vitals Sign stable, CBG 334. Pt states she has had elevated CBG for the last week. Ax4, NAD at this time.

## 2012-12-31 NOTE — Consult Note (Signed)
[facility] - CONSULT NOTE Resident Note    Please see below for attending addendum to resident note.   Date: 12/31/2012                  Patient Name:  [name]  MRN: [id number]  DOB: Dec 03, 1945  Age / Sex: 67 y.o., female         PCP: Laurann Montana S                 Referring Physician: ED physician                 Reason for Consult: Acute renal failure            History of Present Illness: Patient is a 67 y.o. female with a PMHx of CKD, DM, HTN, MI/CAD s/p CABG and stenting, CVA, CHF, COPD, and s/p tracheostomyin the past with trach collar on 3 lit of oxygen, who was admitted to [facility] on 12/31/2012 for evaluation of nausea, vomiting and diarrhea x 1 day. She was noted to have acute renal failure.   She admits that she continues to take her home meds yesterday including lasix, Avapro and Spirolactone. She is not on home potassium. Denies OTC NSAIDs,   Of note, she was recently discharged from hospital after treatment of A/C HF. Spirolactone was added to her regimen. Most recent 2 D echo on 11/16/12 showed LVEF 25-30% and grade 2 diastolic dysfunction.   She had a similar presentation with hyperkalemia of K > 7.5, ARF and widening of the QRS complex  on 12/14/11 thought to be related to overdiuresis, potassium supplement, spirolactone and ARB.   Medications: Outpatient medications:   Medication List    ASK your doctor about these medications       acetaminophen 500 MG tablet  Commonly known as:  TYLENOL  Take 500 mg by mouth every 6 (six) hours as needed. For pain     albuterol (2.5 MG/3ML) 0.083% nebulizer solution  Commonly known as:  PROVENTIL  Take 2.5 mg by nebulization 4 (four) times daily.     aspirin EC 81 MG tablet  Take 81 mg by mouth daily.     carvedilol 25 MG tablet  Commonly known as:  COREG  Take 25 mg by mouth 2 (two) times daily with a meal.     clopidogrel 75 MG tablet  Commonly known as:  PLAVIX  Take 75 mg by mouth daily.     ferrous fumarate 325 (106 FE) MG Tabs  Commonly known as:  HEMOCYTE - 106 mg FE  Take 1 tablet (106 mg of iron total) by mouth 2 (two) times daily.     furosemide 40 MG tablet  Commonly known as:  LASIX  Take 40 mg by mouth daily. May take 1 additional dose for a weight gain of 2 lbs     guaiFENesin 600 MG 12 hr tablet  Commonly known as:  MUCINEX  Take 1 tablet (600 mg total) by mouth 2 (two) times daily as needed.     insulin aspart 100 UNIT/ML injection  Commonly known as:  novoLOG  Inject 2-10 Units into the skin 3 (three) times daily before meals. Sliding scale as directed     insulin glargine 100 UNIT/ML injection  Commonly known as:  LANTUS  Inject 22 Units into the skin at bedtime.     ipratropium 0.02 % nebulizer solution  Commonly known as:  ATROVENT  Take 0.5 mg by nebulization 4 (four)  times daily.     irbesartan 300 MG tablet  Commonly known as:  AVAPRO  Take 300 mg by mouth at bedtime.     loperamide 2 MG capsule  Commonly known as:  IMODIUM  Take 4 mg by mouth 4 (four) times daily as needed. For diarrhea     multivitamin with minerals Tabs  Take 1 tablet by mouth daily.     nitroGLYCERIN 0.4 MG SL tablet  Commonly known as:  NITROSTAT  Place 0.4 mg under the tongue every 5 (five) minutes as needed. For chest pain     simvastatin 40 MG tablet  Commonly known as:  ZOCOR  Take 40 mg by mouth every evening.     spironolactone 25 MG tablet  Commonly known as:  ALDACTONE  Take 1 tablet (25 mg total) by mouth daily.     traMADol 50 MG tablet  Commonly known as:  ULTRAM  Take 50 mg by mouth every 6 (six) hours as needed for pain.         (Not in a hospital admission)  Current medications: Current Facility-Administered Medications  Medication Dose Route Frequency Provider Last Rate Last Dose  . 0.9 %  sodium chloride infusion   Intravenous Continuous Kathlen Mody, MD      . albuterol (PROVENTIL) (5 MG/ML) 0.5% nebulizer solution 2.5 mg  2.5 mg  Nebulization Q4H Elson Areas, PA-C   2.5 mg at 12/31/12 4098  . insulin aspart (novoLOG) injection 0-5 Units  0-5 Units Subcutaneous QHS Kathlen Mody, MD      . insulin aspart (novoLOG) injection 0-9 Units  0-9 Units Subcutaneous TID WC Kathlen Mody, MD      . sodium polystyrene (KAYEXALATE) 15 GM/60ML suspension 15 g  15 g Oral Once Kathlen Mody, MD      . sodium polystyrene (KAYEXALATE) 15 GM/60ML suspension 15 g  15 g Oral Once Kathlen Mody, MD       Current Outpatient Prescriptions  Medication Sig Dispense Refill  . aspirin EC 81 MG tablet Take 81 mg by mouth daily.      . carvedilol (COREG) 25 MG tablet Take 25 mg by mouth 2 (two) times daily with a meal.      . clopidogrel (PLAVIX) 75 MG tablet Take 75 mg by mouth daily.      . furosemide (LASIX) 40 MG tablet Take 40 mg by mouth daily. May take 1 additional dose for a weight gain of 2 lbs      . insulin aspart (NOVOLOG) 100 UNIT/ML injection Inject 2-10 Units into the skin 3 (three) times daily before meals. Sliding scale as directed      . insulin glargine (LANTUS) 100 UNIT/ML injection Inject 22 Units into the skin at bedtime.      . irbesartan (AVAPRO) 300 MG tablet Take 300 mg by mouth at bedtime.      . Multiple Vitamin (MULTIVITAMIN WITH MINERALS) TABS Take 1 tablet by mouth daily.      . simvastatin (ZOCOR) 40 MG tablet Take 40 mg by mouth every evening.       Marland Kitchen spironolactone (ALDACTONE) 25 MG tablet Take 1 tablet (25 mg total) by mouth daily.  30 tablet  0  . traMADol (ULTRAM) 50 MG tablet Take 50 mg by mouth every 6 (six) hours as needed for pain.      Marland Kitchen acetaminophen (TYLENOL) 500 MG tablet Take 500 mg by mouth every 6 (six) hours as needed. For pain      .  albuterol (PROVENTIL) (2.5 MG/3ML) 0.083% nebulizer solution Take 2.5 mg by nebulization 4 (four) times daily.      . ferrous fumarate (HEMOCYTE - 106 MG FE) 325 (106 FE) MG TABS Take 1 tablet (106 mg of iron total) by mouth 2 (two) times daily.  60 each  0  . guaiFENesin  (MUCINEX) 600 MG 12 hr tablet Take 1 tablet (600 mg total) by mouth 2 (two) times daily as needed.  60 tablet  0  . ipratropium (ATROVENT) 0.02 % nebulizer solution Take 0.5 mg by nebulization 4 (four) times daily.      Marland Kitchen loperamide (IMODIUM) 2 MG capsule Take 4 mg by mouth 4 (four) times daily as needed. For diarrhea      . nitroGLYCERIN (NITROSTAT) 0.4 MG SL tablet Place 0.4 mg under the tongue every 5 (five) minutes as needed. For chest pain      . [DISCONTINUED] dexlansoprazole (DEXILANT) 60 MG capsule Take 60 mg by mouth daily.          Allergies: Allergies  Allergen Reactions  . Crestor (Rosuvastatin Calcium) Other (See Comments)    Muscle Pain  . Lisinopril Other (See Comments)    "Lowers her BP too low" & cough  . Vicodin (Hydrocodone-Acetaminophen) Nausea And Vomiting      Past Medical History: Past Medical History  Diagnosis Date  . Diabetes mellitus     on Lantus 30 U   . CAD (coronary artery disease)     S/p CABG and stenting  . PAD (peripheral artery disease)   . Stroke     MRI 11/2011 with remote occipital lobe. MRA with moderate left focal vertebral artery stenosis  . CHF (congestive heart failure) 11/2011    Echo with EF 30-35%, global hypokinesis, and inferior akinesis  . Hyperlipidemia   . DDD (degenerative disc disease), lumbar   . CVA (cerebral vascular accident) 11/2010  . MI (myocardial infarction) 1997  . COPD (chronic obstructive pulmonary disease)   . Anemia   . Chronic kidney disease   . Irregular heart beat   . Hypertension   . GERD (gastroesophageal reflux disease)   . History of IBS   . Hypothyroidism     Goiter  . Thyroid disease   . Carotid artery occlusion      Past Surgical History: Past Surgical History  Procedure Laterality Date  . Ptca    . Thyroidectomy    . Coronary artery bypass graft      2 vessel  . Carotid endarterectomy  ~2008    Left   . Cholecystectomy    . Tracheostomy tube placement  01/02/2012  . Angioplasty   [id number]    Aortogram by Dr. [place] [facility])  . Pr vein bypass graft,aorto-fem-pop      Right common femoral-AK popliteal BPG & Right Popliteal-posterior tibial  . Pr vein bypass graft,aorto-fem-pop      Left Fem-pop BPG     Family History: Family History  Problem Relation Age of Onset  . Hyperlipidemia Mother   . Other Mother     AAA  . Diabetes Daughter      Social History:  reports that she quit smoking about 23 months ago. Her smoking use included Cigarettes. She has a 50 pack-year smoking history. She has never used smokeless tobacco. She reports that she does not drink alcohol or use illicit drugs.   Review of Systems: As per HPI  Vital Signs: Blood pressure 156/54, pulse 83, temperature 98.2 F (36.8  C), temperature source Oral, resp. rate 18, SpO2 98.00%.  Weight trends: There were no vitals filed for this visit.  Physical Exam: General: Vital signs reviewed and noted. in no acute distress; alert, appropriate and cooperative throughout examination.  Head: Normocephalic, atraumatic.  Eyes: PERRL, EOMI, No signs of anemia or jaundince.  Nose: Mucous membranes moist, not inflammed, nonerythematous.  Throat: Oropharynx nonerythematous, no exudate appreciated. Tracheostomy noted.  Neck: No deformities, masses, or tenderness noted.Supple, No carotid Bruits, no JVD.  Lungs:  Normal respiratory effort. Clear to auscultation BL without crackles or wheezes.  Heart: RRR. S1 and S2 normal without gallop, murmur, or rubs.  Abdomen:  BS normoactive. Soft, Nondistended, non-tender.  No masses or organomegaly.  Extremities: No pretibial edema.  Neurologic: A&O X3, CN II - XII are grossly intact. Motor strength is 5/5 in the all 4 extremities, Sensations intact to light touch, Cerebellar signs negative.  Skin: No visible rashes, scars.    Lab results: Basic Metabolic Panel:  Recent Labs Lab 12/31/12 0703 12/31/12 0835  NA 128* 132*  K >7.5* >7.5*  CL 95*  99  CO2 23 20  GLUCOSE 327* 320*  BUN 79* 80*  CREATININE 2.53* 2.37*  CALCIUM 9.7 9.4    Liver Function Tests:  Recent Labs Lab 12/31/12 0703  AST 45*  ALT 72*  ALKPHOS 108  BILITOT 0.3  PROT 9.0*  ALBUMIN 4.4   No results found for this basename: LIPASE, AMYLASE,  in the last 168 hours No results found for this basename: AMMONIA,  in the last 168 hours  CBC:  Recent Labs Lab 12/31/12 0703  WBC 16.1*  NEUTROABS 15.1*  HGB 12.7  HCT 38.2  MCV 88.8  PLT 183    Cardiac Enzymes: No results found for this basename: CKTOTAL, CKMB, CKMBINDEX, TROPONINI,  in the last 168 hours  BNP: No components found with this basename: POCBNP,   CBG:  Recent Labs Lab 12/31/12 0833  GLUCAP 300*    Microbiology: Results for orders placed during the hospital encounter of 11/15/12  MRSA PCR SCREENING     Status: None   Collection Time    11/15/12  8:03 PM      Result Value Range Status   MRSA by PCR NEGATIVE  NEGATIVE Final   Comment:            The GeneXpert MRSA Assay (FDA     approved for NASAL specimens     only), is one component of a     comprehensive MRSA colonization     surveillance program. It is not     intended to diagnose MRSA     infection nor to guide or     monitor treatment for     MRSA infections.    Coagulation Studies: No results found for this basename: LABPROT, INR,  in the last 72 hours  Urinalysis: No results found for this basename: COLORURINE, APPERANCEUR, LABSPEC, PHURINE, GLUCOSEU, HGBUR, BILIRUBINUR, KETONESUR, PROTEINUR, UROBILINOGEN, NITRITE, LEUKOCYTESUR,  in the last 72 hours    Imaging: Dg Chest 2 View  12/31/2012  *RADIOLOGY REPORT*  Clinical Data: Tracheostomy, cough  CHEST - 2 VIEW  Comparison: 11/17/2012; 11/15/2012  Findings: Grossly unchanged enlarged cardiac silhouette and mediastinal contours post median sternotomy. Post coronary artery stenting. Stable positioning of support apparatus.  Lung volumes remain reduced with  grossly unchanged linear heterogeneous opacities within the left mid lung favored to represent atelectasis.  No new focal airspace opacities.  No definite evidence of pulmonary  edema or pneumothorax.  Unchanged bones. Post cholecystectomy.  IMPRESSION: Persistently reduced lung volumes without acute cardiopulmonary disease.   Original Report Authenticated By: Tacey Ruiz, MD       Assessment & Plan:  1. Hyperkalemia, potassium level was more than 7.5 with EKG changes with widening of the QRS complex     Multiple etiologies including ARF, medication ( Spirolactone and ARB).   - Calcium Gluconate, insulin, Bicarb - Kayexalate 30 po X 2 - close monitoring  - UA pending - IVF  - hold home med lasix, spirolactone and ARB.  2. ARF - likely associated with volume depletion 2/2 N/V/D and diuresis, ARB, possible ATN - insert foley catheter - IVF at 100 ml /hours x 12 hours and repeat BMP - caution fluid overload due to low EF.  3. Hyponatremia, associated with ARF  4. RBBB and LAFB and prolonged QT - close monitoring - check Mg level - recs cardiac workup to rule out other cause --defer to primary care team  DVT PPX - recs heparin - defer to primary care team  Patient history and plan of care reviewed with attending, Dr. Allena Katz.   Dede Query, MD  PGYII, Internal Medicine Resident 12/31/2012, 10:45 AM

## 2012-12-31 NOTE — ED Notes (Signed)
Family reports diarrhea yesterday and vomiting all nite.  Pt has had weakness and numbness all over.  Pt denies any pain.  Pt placed on monitor  With rate in the 60s NSR

## 2012-12-31 NOTE — ED Provider Notes (Signed)
History     CSN: [id number]  Arrival date & time 12/31/12  0611   First MD Initiated Contact with Patient 12/31/12 0715      Chief Complaint  Patient presents with  . Emesis    (Consider location/radiation/quality/duration/timing/severity/associated sxs/prior treatment) Patient is a 67 y.o. female presenting with vomiting. The history is provided by the patient.  Emesis Severity:  Severe Timing:  Constant Able to tolerate:  Liquids Progression:  Worsening Chronicity:  New Relieved by:  Nothing Associated symptoms: diarrhea   Associated symptoms: no abdominal pain   Risk factors: diabetes    Pt complains of vomiting and diarhea since yesterday.  Pt reports increasing weakness  Past Medical History  Diagnosis Date  . Diabetes mellitus     on Lantus 30 U   . CAD (coronary artery disease)     S/p CABG and stenting  . PAD (peripheral artery disease)   . Stroke     MRI 11/2011 with remote occipital lobe. MRA with moderate left focal vertebral artery stenosis  . CHF (congestive heart failure) 11/2011    Echo with EF 30-35%, global hypokinesis, and inferior akinesis  . Hyperlipidemia   . DDD (degenerative disc disease), lumbar   . CVA (cerebral vascular accident) 11/2010  . MI (myocardial infarction) 1997  . COPD (chronic obstructive pulmonary disease)   . Anemia   . Chronic kidney disease   . Irregular heart beat   . Hypertension   . GERD (gastroesophageal reflux disease)   . History of IBS   . Hypothyroidism     Goiter  . Thyroid disease   . Carotid artery occlusion     Past Surgical History  Procedure Laterality Date  . Ptca    . Thyroidectomy    . Coronary artery bypass graft      2 vessel  . Carotid endarterectomy  ~2008    Left   . Cholecystectomy    . Tracheostomy tube placement  01/02/2012  . Angioplasty  [id number]    Aortogram by Dr. [place] [facility])  . Pr vein bypass graft,aorto-fem-pop      Right common femoral-AK popliteal BPG & Right  Popliteal-posterior tibial  . Pr vein bypass graft,aorto-fem-pop      Left Fem-pop BPG    Family History  Problem Relation Age of Onset  . Hyperlipidemia Mother   . Other Mother     AAA  . Diabetes Daughter     History  Substance Use Topics  . Smoking status: Former Smoker -- 1.00 packs/day for 50 years    Types: Cigarettes    Quit date: 01/24/2011  . Smokeless tobacco: Never Used  . Alcohol Use: No    OB History   Grav Para Term Preterm Abortions TAB SAB Ect Mult Living                  Review of Systems  Respiratory: Positive for shortness of breath.   Gastrointestinal: Positive for vomiting and diarrhea. Negative for abdominal pain.  Neurological: Positive for weakness.  All other systems reviewed and are negative.    Allergies  Crestor; Lisinopril; and Vicodin  Home Medications   Current Outpatient Rx  Name  Route  Sig  Dispense  Refill  . acetaminophen (TYLENOL) 500 MG tablet   Oral   Take 500 mg by mouth every 6 (six) hours as needed. For pain         . albuterol (PROVENTIL) (2.5 MG/3ML) 0.083% nebulizer solution   Nebulization  Take 2.5 mg by nebulization 4 (four) times daily.         Marland Kitchen aspirin EC 81 MG tablet   Oral   Take 81 mg by mouth daily.         . carvedilol (COREG) 25 MG tablet   Oral   Take 25 mg by mouth 2 (two) times daily with a meal.         . clopidogrel (PLAVIX) 75 MG tablet   Oral   Take 75 mg by mouth daily.         . ferrous fumarate (HEMOCYTE - 106 MG FE) 325 (106 FE) MG TABS   Oral   Take 1 tablet (106 mg of iron total) by mouth 2 (two) times daily.   60 each   0   . furosemide (LASIX) 40 MG tablet   Oral   Take 40 mg by mouth daily. May take 1 additional dose for a weight gain of 2 lbs         . guaiFENesin (MUCINEX) 600 MG 12 hr tablet   Oral   Take 1 tablet (600 mg total) by mouth 2 (two) times daily as needed.   60 tablet   0   . insulin aspart (NOVOLOG) 100 UNIT/ML injection   Subcutaneous    Inject 2-10 Units into the skin 3 (three) times daily before meals. Sliding scale as directed         . insulin glargine (LANTUS) 100 UNIT/ML injection   Subcutaneous   Inject 22 Units into the skin at bedtime.         Marland Kitchen EXPIRED: ipratropium (ATROVENT) 0.02 % nebulizer solution   Nebulization   Take 0.5 mg by nebulization 4 (four) times daily.         . irbesartan (AVAPRO) 300 MG tablet   Oral   Take 300 mg by mouth at bedtime.         Marland Kitchen loperamide (IMODIUM) 2 MG capsule   Oral   Take 4 mg by mouth 4 (four) times daily as needed. For diarrhea         . nitroGLYCERIN (NITROSTAT) 0.4 MG SL tablet   Sublingual   Place 0.4 mg under the tongue every 5 (five) minutes as needed. For chest pain         . simvastatin (ZOCOR) 40 MG tablet   Oral   Take 40 mg by mouth every evening.          Marland Kitchen spironolactone (ALDACTONE) 25 MG tablet   Oral   Take 1 tablet (25 mg total) by mouth daily.   30 tablet   0     BP 135/82  Pulse 64  Temp(Src) 98.2 F (36.8 C) (Oral)  Resp 18  SpO2 95%  Physical Exam  Nursing note and vitals reviewed. Constitutional: She is oriented to person, place, and time. She appears well-developed and well-nourished.  HENT:  Head: Normocephalic.  Eyes: Conjunctivae are normal. Pupils are equal, round, and reactive to light.  Neck: Normal range of motion. Neck supple.  Cardiovascular: Normal rate.   Pulmonary/Chest: Effort normal. She has wheezes.  Abdominal: Soft. Bowel sounds are normal.  Musculoskeletal: Normal range of motion.  Neurological: She is alert and oriented to person, place, and time.  Skin: Skin is warm.    ED Course  Procedures (including critical care time)  Labs Reviewed  CBC WITH DIFFERENTIAL  COMPREHENSIVE METABOLIC PANEL  URINALYSIS, ROUTINE W REFLEX MICROSCOPIC   No results found.  1. Hyperkalemia   2. Renal failure (ARF), acute on chronic   3. Hyperglycemia   4. Diarrhea       MDM   Results for orders  placed during the hospital encounter of 12/31/12  CBC WITH DIFFERENTIAL      Result Value Range   WBC 16.1 (*) 4.0 - 10.5 K/uL   RBC 4.30  3.87 - 5.11 MIL/uL   Hemoglobin 12.7  12.0 - 15.0 g/dL   HCT 09.8  11.9 - 14.7 %   MCV 88.8  78.0 - 100.0 fL   MCH 29.5  26.0 - 34.0 pg   MCHC 33.2  30.0 - 36.0 g/dL   RDW 82.9  56.2 - 13.0 %   Platelets 183  150 - 400 K/uL   Neutrophils Relative 94 (*) 43 - 77 %   Neutro Abs 15.1 (*) 1.7 - 7.7 K/uL   Lymphocytes Relative 2 (*) 12 - 46 %   Lymphs Abs 0.4 (*) 0.7 - 4.0 K/uL   Monocytes Relative 3  3 - 12 %   Monocytes Absolute 0.5  0.1 - 1.0 K/uL   Eosinophils Relative 1  0 - 5 %   Eosinophils Absolute 0.1  0.0 - 0.7 K/uL   Basophils Relative 0  0 - 1 %   Basophils Absolute 0.0  0.0 - 0.1 K/uL  COMPREHENSIVE METABOLIC PANEL      Result Value Range   Sodium 128 (*) 135 - 145 mEq/L   Potassium >7.5 (*) 3.5 - 5.1 mEq/L   Chloride 95 (*) 96 - 112 mEq/L   CO2 23  19 - 32 mEq/L   Glucose, Bld 327 (*) 70 - 99 mg/dL   BUN 79 (*) 6 - 23 mg/dL   Creatinine, Ser 8.65 (*) 0.50 - 1.10 mg/dL   Calcium 9.7  8.4 - 78.4 mg/dL   Total Protein 9.0 (*) 6.0 - 8.3 g/dL   Albumin 4.4  3.5 - 5.2 g/dL   AST 45 (*) 0 - 37 U/L   ALT 72 (*) 0 - 35 U/L   Alkaline Phosphatase 108  39 - 117 U/L   Total Bilirubin 0.3  0.3 - 1.2 mg/dL   GFR calc non Af Amer 19 (*) >90 mL/min   GFR calc Af Amer 22 (*) >90 mL/min   Dg Chest 2 View  12/31/2012  *RADIOLOGY REPORT*  Clinical Data: Tracheostomy, cough  CHEST - 2 VIEW  Comparison: 11/17/2012; 11/15/2012  Findings: Grossly unchanged enlarged cardiac silhouette and mediastinal contours post median sternotomy. Post coronary artery stenting. Stable positioning of support apparatus.  Lung volumes remain reduced with grossly unchanged linear heterogeneous opacities within the left mid lung favored to represent atelectasis.  No new focal airspace opacities.  No definite evidence of pulmonary edema or pneumothorax.  Unchanged bones.  Post cholecystectomy.  IMPRESSION: Persistently reduced lung volumes without acute cardiopulmonary disease.   Original Report Authenticated By: Tacey Ruiz, MD       Date: 12/31/2012  Rate: 61  Rhythm: normal sinus rhythm  QRS Axis: normal  Intervals: normal  ST/T Wave abnormalities: nonspecific T wave changes  Conduction Disutrbances:none  Narrative Interpretation:   Old EKG Reviewed: changes noted   ED course.  Pt on cardiac monitor,  Pt has elevated, bun, creatinine, hyperkalemia and hyperglycemia.   Pt given insulin, kayexalate, calcium, bicarb, albuterol neb,   Dr. Ranae Palms in to see and examine.  I spoke to [facility] hospitalist who will see for admission  Lonia Skinner [place], PA-C 12/31/12 1132

## 2012-12-31 NOTE — ED Notes (Signed)
TC to LAB tech to recollect bmp.

## 2012-12-31 NOTE — Consult Note (Signed)
Admit date: 12/31/2012 Referring Physician  Dr. Dede Query Primary Physician Cala Bradford, MD Primary Cardiologist  [facility] Reason for Consultation  Abnormal ECG  HPI: 67 year old  with CAD post CABG, CVA, DM, COPD, systolic heart failure, EF 25% on 11/16/12, chronic kidney disease who was on Aldactone here with acute renal failure, hyperkalemia, ECG abnormality with right bundle branch block and left anterior fascicular block. Sinus rhythm. Her labs on 12/18/12 demonstrated a creatinine of 1.36, potassium of 5.3 while being on Aldactone 25 mg daily as well as Lasix 40 mg daily. At lab work was drawn after initiating Aldactone. She is not on home potassium supplementation. She had a similar presentation with hyperkalemia, potassium greater than 7.5 with acute renal failure, widening of QRS complex on 12/14/11 possibly related to overdiuresis, potassium supplementation, Aldactone as well as angiotensin receptor blocker. Most recently, she seemed to have stable creatinine/potassium with close surveillance in the outpatient setting.  She presented with nausea, vomiting, diarrhea times one day. She's had a prior tracheostomy in the past. Spoke to her daughter.    PMH:   Past Medical History  Diagnosis Date  . Diabetes mellitus     on Lantus 30 U   . CAD (coronary artery disease)     S/p CABG and stenting  . PAD (peripheral artery disease)   . Stroke     MRI 11/2011 with remote occipital lobe. MRA with moderate left focal vertebral artery stenosis  . CHF (congestive heart failure) 11/2011    Echo with EF 30-35%, global hypokinesis, and inferior akinesis  . Hyperlipidemia   . DDD (degenerative disc disease), lumbar   . CVA (cerebral vascular accident) 11/2010  . MI (myocardial infarction) 1997  . COPD (chronic obstructive pulmonary disease)   . Anemia   . Chronic kidney disease   . Irregular heart beat   . Hypertension   . GERD (gastroesophageal reflux disease)   . History of IBS   .  Hypothyroidism     Goiter  . Thyroid disease   . Carotid artery occlusion     PSH:   Past Surgical History  Procedure Laterality Date  . Ptca    . Thyroidectomy    . Coronary artery bypass graft      2 vessel  . Carotid endarterectomy  ~2008    Left   . Cholecystectomy    . Tracheostomy tube placement  01/02/2012  . Angioplasty  [id number]    Aortogram by Dr. [place] McKenzie [facility])  . Pr vein bypass graft,aorto-fem-pop      Right common femoral-AK popliteal BPG & Right Popliteal-posterior tibial  . Pr vein bypass graft,aorto-fem-pop      Left Fem-pop BPG   Allergies:  Crestor; Lisinopril; and Vicodin Prior to Admit Meds:   (Not in a hospital admission) Prior to Admission medications   Medication Sig Start Date End Date Taking? Authorizing Provider  aspirin EC 81 MG tablet Take 81 mg by mouth daily.   Yes Historical Provider, MD  carvedilol (COREG) 25 MG tablet Take 25 mg by mouth 2 (two) times daily with a meal. 11/28/11  Yes Danley Danker, MD  clopidogrel (PLAVIX) 75 MG tablet Take 75 mg by mouth daily. 11/28/11  Yes Danley Danker, MD  furosemide (LASIX) 40 MG tablet Take 40 mg by mouth daily. May take 1 additional dose for a weight gain of 2 lbs   Yes Historical Provider, MD  insulin aspart (NOVOLOG) 100 UNIT/ML injection Inject 2-10 Units into  the skin 3 (three) times daily before meals. Sliding scale as directed   Yes Historical Provider, MD  insulin glargine (LANTUS) 100 UNIT/ML injection Inject 22 Units into the skin at bedtime.   Yes Historical Provider, MD  irbesartan (AVAPRO) 300 MG tablet Take 300 mg by mouth at bedtime.   Yes Historical Provider, MD  Multiple Vitamin (MULTIVITAMIN WITH MINERALS) TABS Take 1 tablet by mouth daily.   Yes Historical Provider, MD  simvastatin (ZOCOR) 40 MG tablet Take 40 mg by mouth every evening.  11/28/11  Yes Danley Danker, MD  spironolactone (ALDACTONE) 25 MG tablet Take 1 tablet (25 mg total) by mouth daily. 11/18/12   Yes Kathlen Mody, MD  traMADol (ULTRAM) 50 MG tablet Take 50 mg by mouth every 6 (six) hours as needed for pain.   Yes Historical Provider, MD  acetaminophen (TYLENOL) 500 MG tablet Take 500 mg by mouth every 6 (six) hours as needed. For pain    Historical Provider, MD  albuterol (PROVENTIL) (2.5 MG/3ML) 0.083% nebulizer solution Take 2.5 mg by nebulization 4 (four) times daily.    Historical Provider, MD  ferrous fumarate (HEMOCYTE - 106 MG FE) 325 (106 FE) MG TABS Take 1 tablet (106 mg of iron total) by mouth 2 (two) times daily. 10/23/12   Joseph Art, DO  guaiFENesin (MUCINEX) 600 MG 12 hr tablet Take 1 tablet (600 mg total) by mouth 2 (two) times daily as needed. 10/23/12   Joseph Art, DO  ipratropium (ATROVENT) 0.02 % nebulizer solution Take 0.5 mg by nebulization 4 (four) times daily. 11/28/11 11/27/12  Danley Danker, MD  loperamide (IMODIUM) 2 MG capsule Take 4 mg by mouth 4 (four) times daily as needed. For diarrhea    Historical Provider, MD  nitroGLYCERIN (NITROSTAT) 0.4 MG SL tablet Place 0.4 mg under the tongue every 5 (five) minutes as needed. For chest pain    Historical Provider, MD   Fam HX:    Family History  Problem Relation Age of Onset  . Hyperlipidemia Mother   . Other Mother     AAA  . Diabetes Daughter    Social HX:    History   Social History  . Marital Status: Single    Spouse Name: N/A    Number of Children: 3  . Years of Education: N/A   Occupational History  . Retired    Social History Main Topics  . Smoking status: Former Smoker -- 1.00 packs/day for 50 years    Types: Cigarettes    Quit date: 01/24/2011  . Smokeless tobacco: Never Used  . Alcohol Use: No  . Drug Use: No  . Sexually Active: No   Other Topics Concern  . Not on file   Social History Narrative   Retired Advertising account executive.  Worked at the [employer] on [employer].  Formerly lived in [place] area.  2 daughters, Living here with her daughter in [place] starting end  of January 2013. [name] [phone] [phone]. Ambulatory previously with a cane, but none now.     ROS:  Positive for diarrhea. All 11 ROS were addressed and are negative except what is stated in the HPI  Physical Exam: Blood pressure 106/44, pulse 83, temperature 98.2 F (36.8 C), temperature source Oral, resp. rate 18, SpO2 98.00%.    General: Well developed, well nourished, in no acute distress Head: Eyes PERRLA, No xanthomas.   Normal cephalic and atramatic, tracheostomy noted Lungs:   Clear bilaterally to auscultation  and percussion. Normal respiratory effort. No wheezes, no rales. Heart:   HRRR S1 S2 Pulses are 2+ & equal. No significant murmurs      No carotid bruit. No JVD.  No abdominal bruits. No femoral bruits. Abdomen: Bowel sounds are positive, abdomen soft and non-tender without masses. No hepatosplenomegaly. Msk:  Back normal. Normal strength and tone for age. Extremities:   No clubbing, cyanosis or edema.  DP +1 Neuro: Alert and oriented X 3, non-focal, MAE x 4 GU: Deferred Rectal: Deferred Psych:  Good affect, responds appropriately    Labs:   Lab Results  Component Value Date   WBC 16.1* 12/31/2012   HGB 12.7 12/31/2012   HCT 38.2 12/31/2012   MCV 88.8 12/31/2012   PLT 183 12/31/2012    Recent Labs Lab 12/31/12 0703 12/31/12 0835  NA 128* 132*  K >7.5* >7.5*  CL 95* 99  CO2 23 20  BUN 79* 80*  CREATININE 2.53* 2.37*  CALCIUM 9.7 9.4  PROT 9.0*  --   BILITOT 0.3  --   ALKPHOS 108  --   ALT 72*  --   AST 45*  --   GLUCOSE 327* 320*   No results found for this basename: PTT   Lab Results  Component Value Date   INR 1.06 12/15/2011   Lab Results  Component Value Date   CKTOTAL 67 12/02/2011   CKMB 4.8* 12/02/2011   TROPONINI <0.30 11/16/2012     Lab Results  Component Value Date   CHOL 247* 11/27/2011   Lab Results  Component Value Date   HDL 37* 11/27/2011   Lab Results  Component Value Date   LDLCALC 171* 11/27/2011   Lab Results  Component Value  Date   TRIG 194* 11/27/2011   Lab Results  Component Value Date   CHOLHDL 6.7 11/27/2011   No results found for this basename: LDLDIRECT      Radiology:  Dg Chest 2 View  12/31/2012  *RADIOLOGY REPORT*  Clinical Data: Tracheostomy, cough  CHEST - 2 VIEW  Comparison: 11/17/2012; 11/15/2012  Findings: Grossly unchanged enlarged cardiac silhouette and mediastinal contours post median sternotomy. Post coronary artery stenting. Stable positioning of support apparatus.  Lung volumes remain reduced with grossly unchanged linear heterogeneous opacities within the left mid lung favored to represent atelectasis.  No new focal airspace opacities.  No definite evidence of pulmonary edema or pneumothorax.  Unchanged bones. Post cholecystectomy.  IMPRESSION: Persistently reduced lung volumes without acute cardiopulmonary disease.   Original Report Authenticated By: Tacey Ruiz, MD    Personally viewed.  EKG:  Sinus rhythm, first degree AV block, right bundle branch block, left anterior fascicular block, bifascicular block, prolonged QT. QRS with 170 ms, QTC 534 ms. Personally viewed. Prior EKG in January demonstrated narrow complex QRS, borderline PR prolongation.  Echocardiogram: Ejection fraction 25% in January 2014.  ASSESSMENT/PLAN:   67 year old female with systolic dysfunction, EF 25% now with acute renal failure, hyperkalemia, EKG abnormalities.  1. Abnormal EKG-bifascicular block, widened QRS. Agree that hyperkalemia/lateral abnormality is likely playing a role with her change in EKG. Continue to correct underlying hyperkalemia. I would not utilize Aldactone any further. If creatinine returns to baseline, 1.3-1.4, it would not be unreasonable to utilize angiotensin receptor blocker given her underlying cardiomyopathy but once again, this will need to be under close surveillance of her electrolytes and renal function.  2. Chronic systolic heart failure-EF 25%-I agree that she does need IV fluids. Be  careful as she may  quickly become volume overloaded.  3. Acute renal failure-likely tipped from dehydration in conjunction with Aldactone/ARB. Agree with holding Lasix, Aldactone, ARB currently. Kayexalate is being given for hyperkalemia.  4. Bifascicular block/QT prolongation-this should improve with resolution of hyperkalemia. We will continue to monitor. Telemetry. Be aware of possible adverse arrhythmias.  Donato Schultz, MD  12/31/2012  11:51 AM

## 2012-12-31 NOTE — ED Notes (Signed)
Admit Doctor at bedside.  

## 2012-12-31 NOTE — ED Notes (Signed)
PT transported to US.

## 2013-01-01 DIAGNOSIS — E860 Dehydration: Secondary | ICD-10-CM

## 2013-01-01 LAB — GLUCOSE, CAPILLARY
Glucose-Capillary: 255 mg/dL — ABNORMAL HIGH (ref 70–99)
Glucose-Capillary: 144 mg/dL — ABNORMAL HIGH (ref 70–99)
Glucose-Capillary: 149 mg/dL — ABNORMAL HIGH (ref 70–99)

## 2013-01-01 LAB — RENAL FUNCTION PANEL
Albumin: 3.7 g/dL (ref 3.5–5.2)
CO2: 26 mEq/L (ref 19–32)
Calcium: 9.6 mg/dL (ref 8.4–10.5)
GFR calc Af Amer: 20 mL/min — ABNORMAL LOW (ref >90)
GFR calc non Af Amer: 17 mL/min — ABNORMAL LOW (ref >90)
Phosphorus: 4.1 mg/dL (ref 2.3–4.6)
Sodium: 140 mEq/L (ref 135–145)

## 2013-01-01 LAB — BASIC METABOLIC PANEL
CO2: 25 mEq/L (ref 19–32)
Chloride: 101 mEq/L (ref 96–112)
Chloride: 100 mEq/L (ref 96–112)
Creatinine, Ser: 2.39 mg/dL — ABNORMAL HIGH (ref 0.50–1.10)
GFR calc Af Amer: 23 mL/min — ABNORMAL LOW (ref >90)
Glucose, Bld: 144 mg/dL — ABNORMAL HIGH (ref 70–99)
Potassium: 3.6 mEq/L (ref 3.5–5.1)
Sodium: 139 mEq/L (ref 135–145)
Sodium: 137 mEq/L (ref 135–145)

## 2013-01-01 LAB — CBC
Hemoglobin: 11.2 g/dL — ABNORMAL LOW (ref 12.0–15.0)
MCHC: 33.1 g/dL (ref 30.0–36.0)
Platelets: 183 10*3/uL (ref 150–400)

## 2013-01-01 LAB — TSH: TSH: 3.104 u[IU]/mL (ref 0.350–4.500)

## 2013-01-01 MED ORDER — TRAMADOL HCL 50 MG PO TABS
50.0000 mg | ORAL_TABLET | Freq: Four times a day (QID) | ORAL | Status: DC | PRN
  Administered 2013-01-01 – 2013-01-04 (×5): 50 mg via ORAL
  Filled 2013-01-01 (×6): qty 1

## 2013-01-01 MED ORDER — PNEUMOCOCCAL VAC POLYVALENT 25 MCG/0.5ML IJ INJ
0.5000 mL | INJECTION | INTRAMUSCULAR | Status: AC
  Administered 2013-01-02: 0.5 mL via INTRAMUSCULAR
  Filled 2013-01-01: qty 0.5

## 2013-01-01 NOTE — Progress Notes (Signed)
Subjective:  Feels better. Walked with PT. Little weak. No CP, no SOB.   Objective:  Vital Signs in the last 24 hours: Temp:  [98.1 F (36.7 C)-98.8 F (37.1 C)] 98.7 F (37.1 C) (03/11 1227) Pulse Rate:  [83-113] 83 (03/11 1227) Resp:  [11-34] 19 (03/11 1227) BP: (78-167)/(30-82) 150/52 mmHg (03/11 1227) SpO2:  [93 %-100 %] 96 % (03/11 1231) FiO2 (%):  [8 %-28 %] 28 % (03/11 0759) Weight:  [69.5 kg (153 lb 3.5 oz)-71.7 kg (158 lb 1.1 oz)] 71.7 kg (158 lb 1.1 oz) (03/11 0100)  Intake/Output from previous day:     Physical Exam: General: Well developed, well nourished, in no acute distress. Head:  Normocephalic and atraumatic. Trach Lungs: Clear to auscultation and percussion. Heart: Normal S1 and S2.  No murmur, rubs or gallops.  Abdomen: soft, non-tender, positive bowel sounds. Extremities: No clubbing or cyanosis. No edema. Neurologic: Alert and oriented x 3.    Lab Results:  Recent Labs  12/31/12 2015 01/01/13 0450  WBC 9.8 9.5  HGB 11.4* 11.2*  PLT 182 183    Recent Labs  12/31/12 2320 01/01/13 0450  NA 140 139  K 3.9 3.6  CL 101 101  CO2 26 25  GLUCOSE 205* 144*  BUN 75* 74*  CREATININE 2.73* 2.89*  Hepatic Function Panel  Recent Labs  12/31/12 0703 12/31/12 2320  PROT 9.0*  --   ALBUMIN 4.4 3.7  AST 45*  --   ALT 72*  --   ALKPHOS 108  --   BILITOT 0.3  --     Imaging: Dg Chest 2 View  12/31/2012  *RADIOLOGY REPORT*  Clinical Data: Tracheostomy, cough  CHEST - 2 VIEW  Comparison: 11/17/2012; 11/15/2012  Findings: Grossly unchanged enlarged cardiac silhouette and mediastinal contours post median sternotomy. Post coronary artery stenting. Stable positioning of support apparatus.  Lung volumes remain reduced with grossly unchanged linear heterogeneous opacities within the left mid lung favored to represent atelectasis.  No new focal airspace opacities.  No definite evidence of pulmonary edema or pneumothorax.  Unchanged bones. Post  cholecystectomy.  IMPRESSION: Persistently reduced lung volumes without acute cardiopulmonary disease.   Original Report Authenticated By: Tacey Ruiz, MD    US Renal  12/31/2012  *RADIOLOGY REPORT*  Clinical Data: Acute on chronic renal failure.  Diabetes mellitus.  RENAL/URINARY TRACT ULTRASOUND COMPLETE  Comparison:  None  Findings:  Right Kidney:  10.0 cm in length.  Lower pole cyst measures 2.0 x 1.6 x 1.9 cm.  No solid mass or hydronephrosis. Normal echogenicity.  Left Kidney:  10.3 cm in length.  No hydronephrosis or mass. Normal echogenicity.  Bladder:  Normal in appearance.  IMPRESSION:  1.  Incidental note of a benign right renal cyst. 2.  Normal renal echogenicity bilaterally. 3.  No hydronephrosis.   Original Report Authenticated By: Norva Pavlov, M.D.    Personally viewed.   Telemetry: SR Personally viewed.   Assessment/Plan:  Active Problems:   Chronic systolic congestive heart failure, NYHA class 3/EF 20-25% (ECHO 10/2012)   PAD (peripheral artery disease)   Diabetes mellitus type 2, uncontrolled   Hypertension   COPD (chronic obstructive pulmonary disease)   H/O hyperthyroidism s/p thyroidectomy   Hyperkalemia   Acute renal failure   Subglottic stenosis w/chronic tracheostomy   Occlusion and stenosis of carotid artery without mention of cerebral infarction   Anemia   Chronic diastolic heart failure, NYHA class 2   Nausea vomiting and diarrhea   Dehydration    -  hyperkalemia improved -Creat remaining elevated. PO intake.  -Avoid ARB, Spironolactone.  -No lasix for now -Does not appear to be in heart failure. -Dehydrated from diarrhea.  -Cont Coreg.  SKAINS, MARK 01/01/2013, 1:00 PM

## 2013-01-01 NOTE — Progress Notes (Addendum)
[facility] Progress Note [place] TEAM 1 - Stepdown/ICU TEAM   [name] ZOX:[id number] DOB: [birth year] DOA: 12/31/2012 PCP: Cala Bradford, MD  Brief narrative: 67 year old female patient who has chronic combined systolic and diastolic heart failure as well as diabetes. She was brought in to the hospital by her daughter due to complaints of persistent diarrhea as well as tingling and numbness in her hands. The daughter recently had gastrointestinal illness on Saturday with similar symptoms. Workup in the ER revealed the patient to be in acute renal failure with hyperkalemia with associated EKG changes. She was admitted to the step down unit.  Assessment/Plan: Active Problems:   Hyperkalemia/Acute renal failure -precipitated by gastroenteritis and further exacerbated by Aldactone and Avapro -K+ has normalized w/ hydration and kayexalate -cont. to hold offending meds (Aldactone, Lasix and Avapro) -h/o of Aldactone related hyperkalemia while on K replete- seemed to tolerate aldactone better once resumed after last admit- had FU labs at cardiologist since dc -baseline Scr 1.29    Nausea vomiting and diarrhea/Dehydration - resolving -C. Diff negative    Nonspecific abnormal electrocardiogram (ECG) (EKG)/QT prolongation -due to hyperK+ -resolved     Chronic systolic congestive heart failure, NYHA class 3/EF 20-25% (ECHO 10/2012)/Chronic diastolic heart failure, NYHA class 2 -compensated but cautious administration of IVF's    Diabetes mellitus type 2, uncontrolled -HgbA1c 8.9 in Jan 2014 -cont. Lantus and SSI    Hypertension -BP moderatley controlled -cont Coreg- Avapro on hold 2/2 ARF     Subglottic stenosis w/chronic tracheostomy  -chronic fenestrated trach/PMV w/ nocturnal oxygen      PAD (peripheral artery disease) -cont Plavix/ASA    COPD (chronic obstructive pulmonary disease) -compensated    H/O hyperthyroidism s/p thyroidectomy -TSH normal    Occlusion  and stenosis of carotid artery without mention of cerebral infarction -cont. Plavix/ASA    Anemia -Hgb stable  DVT prophylaxis: Lovenox Code Status: Full Family Communication: Patient and Daughter Disposition Plan: Stepdown  Consultants: Nephrology Cardiology  Procedures: None  Antibiotics: None  HPI/Subjective: Alert and without further watery diarrhea although stools remain loose. No chest pain or shortness of breath. No abdominal pain.   Objective: Blood pressure 150/52, pulse 83, temperature 98.7 F (37.1 C), temperature source Oral, resp. rate 19, height 5\' 2"  (1.575 m), weight 71.7 kg (158 lb 1.1 oz), SpO2 96.00%.  Intake/Output Summary (Last 24 hours) at 01/01/13 1303 Last data filed at 01/01/13 0900  Gross per 24 hour  Intake    100 ml  Output      0 ml  Net    100 ml     Exam: General: No acute respiratory distress Lungs: Clear to auscultation bilaterally without wheezes or crackles, trach midline w/ PMV in place Cardiovascular: Regular rate and rhythm without murmur gallop or rub normal S1 and S2, IVF @ 50/hr, no JVD or peripheral edema Abdomen: Nontender, nondistended, soft, bowel sounds positive, no rebound, no ascites, no appreciable mass Musculoskeletal: No significant cyanosis, clubbing of bilateral lower extremities Neurological: Alert and oriented x3, moves all extremities x4, cranial nerves II through XII grossly intact  Data Reviewed: Basic Metabolic Panel:  Recent Labs Lab 12/31/12 1237 12/31/12 1600 12/31/12 2015 12/31/12 2320 01/01/13 0450  NA 140 145 138 140 139  K 6.7* 7.2* 4.4 3.9 3.6  CL 104 108 101 101 101  CO2 22 22 21 26 25   GLUCOSE 208* 153* 278* 205* 144*  BUN 82* 82* 78* 75* 74*  CREATININE 2.52* 2.66* 2.69*  2.71* 2.73* 2.89*  CALCIUM 9.9 10.3 9.7 9.6 9.1  MG 2.5  --   --   --   --   PHOS  --   --   --  4.1  --    Liver Function Tests:  Recent Labs Lab 12/31/12 0703 12/31/12 2320  AST 45*  --   ALT 72*  --    ALKPHOS 108  --   BILITOT 0.3  --   PROT 9.0*  --   ALBUMIN 4.4 3.7   No results found for this basename: LIPASE, AMYLASE,  in the last 168 hours No results found for this basename: AMMONIA,  in the last 168 hours CBC:  Recent Labs Lab 12/31/12 0703 12/31/12 2015 01/01/13 0450  WBC 16.1* 9.8 9.5  NEUTROABS 15.1*  --   --   HGB 12.7 11.4* 11.2*  HCT 38.2 34.2* 33.8*  MCV 88.8 87.5 87.1  PLT 183 182 183   Cardiac Enzymes: No results found for this basename: CKTOTAL, CKMB, CKMBINDEX, TROPONINI,  in the last 168 hours BNP (last 3 results)  Recent Labs  11/15/12 1254 11/16/12 0232 12/31/12 2014  PROBNP 5763.0* 8676.0* 1860.0*   CBG:  Recent Labs Lab 12/31/12 2207 01/01/13 0115 01/01/13 0439 01/01/13 0746 01/01/13 1232  GLUCAP 255* 145* 131* 153* 144*    Recent Results (from the past 240 hour(s))  MRSA PCR SCREENING     Status: None   Collection Time    12/31/12  7:28 PM      Result Value Range Status   MRSA by PCR NEGATIVE  NEGATIVE Final   Comment:            The GeneXpert MRSA Assay (FDA     approved for NASAL specimens     only), is one component of a     comprehensive MRSA colonization     surveillance program. It is not     intended to diagnose MRSA     infection nor to guide or     monitor treatment for     MRSA infections.  CLOSTRIDIUM DIFFICILE BY PCR     Status: None   Collection Time    01/01/13  1:52 AM      Result Value Range Status   C difficile by pcr NEGATIVE  NEGATIVE Final     Studies:  Recent x-ray studies have been reviewed in detail by the Attending Physician  Scheduled Meds:  Reviewed in detail by the Attending Physician   Junious Silk, ANP [facility] Office  [phone] Pager [phone]  On-Call/Text Page:      [name]      password [email or web address]  If 7PM-7AM, please contact night-coverage [email or web address] Password [facility] 01/01/2013, 1:03 PM   LOS: 1 day   I have examined the patient, reviewed the chart and  modified the above note which I agree with.   RIZWAN,SAIMA,MD [phone] 01/01/2013, 6:49 PM

## 2013-01-01 NOTE — Evaluation (Addendum)
Physical Therapy Evaluation Patient Details Name: [name] MRN: [id number] DOB: 1946/06/29 Today's Date: 01/01/2013 Time: 5284-1324 PT Time Calculation (min): 30 min  PT Assessment / Plan / Recommendation Clinical Impression  67 y/o female with chronic trach adm. for n/v/d and hyperkalemia. Presents to PT with below impairments affecting safety with mobility. Will benefit physical therapy in the acute setting to maximize functional independence and activity tolerance for improved safety at home. Educated pt on HEP to improve strength while in the hospital.    PT Assessment  Patient needs continued PT services    Follow Up Recommendations  Home health PT    Does the patient have the potential to tolerate intense rehabilitation      Barriers to Discharge   daughter not home during the day    Equipment Recommendations  None recommended by PT    Recommendations for Other Services     Frequency Min 3X/week    Precautions / Restrictions Precautions Precautions: Fall Restrictions Weight Bearing Restrictions: No   Pertinent Vitals/Pain Pt on trach collar however upon entering her room her O2 tubing was not connected and her SaO2 while sleeping remained at 94%; ambulated on RA and it stayed at or above 98%; she reports she doesn't use O2 during the day at home, only at night she sleeps on 4 liters; pt left off O2 sitting EOB, SaO2 99%      Mobility  Bed Mobility Bed Mobility: Supine to Sit Supine to Sit: 6: Modified independent (Device/Increase time) Transfers Transfers: Sit to Stand;Stand to Sit Sit to Stand: 4: Min guard;With upper extremity assist;From bed Stand to Sit: 4: Min guard;With upper extremity assist;To bed Details for Transfer Assistance: stood with bilateral upper extremity support especially in standing pt needing upper extremity support for stability and appeared reluctant to let go of bed rail; gaurding for stability Ambulation/Gait Ambulation/Gait  Assistance: 4: Min guard Ambulation Distance (Feet): 100 Feet Assistive device: None Ambulation/Gait Assistance Details: gaurding for stability and occasional minA as she staggered, pt reaching for upper extremity support on railings along wall Gait velocity: decreased General Gait Details: gaurded slow pace, 2-3 lateral staggers needing to step to correct Stairs: No Wheelchair Mobility Wheelchair Mobility: No    Exercises  Educated on LAQ and marching in sitting as well as shoulder flexion exercises   PT Diagnosis: Abnormality of gait;Generalized weakness  PT Problem List: Decreased strength;Decreased activity tolerance;Decreased balance;Decreased mobility;Decreased knowledge of use of DME PT Treatment Interventions: DME instruction;Gait training;Functional mobility training;Therapeutic exercise;Therapeutic activities;Balance training;Neuromuscular re-education;Patient/family education   PT Goals Acute Rehab PT Goals PT Goal Formulation: With patient Time For Goal Achievement: 01/08/13 Potential to Achieve Goals: Good Pt will go Sit to Stand: with modified independence PT Goal: Sit to Stand - Progress: Goal set today Pt will go Stand to Sit: with modified independence PT Goal: Stand to Sit - Progress: Goal set today Pt will Transfer Bed to Chair/Chair to Bed: with modified independence PT Transfer Goal: Bed to Chair/Chair to Bed - Progress: Goal set today Pt will Ambulate: >150 feet;with modified independence;with least restrictive assistive device PT Goal: Ambulate - Progress: Goal set today Pt will Perform Home Exercise Program: Independently PT Goal: Perform Home Exercise Program - Progress: Goal set today  Visit Information  Last PT Received On: 01/01/13 Assistance Needed: +1    Subjective Data  Subjective: I just don't think its fair that you come in and get me walking before I've even had my breakfast! Patient Stated Goal: home, stronger  Prior Functioning  Home  Living Lives With: Daughter Available Help at Discharge: Available PRN/intermittently (daughter works 8-4:30) Type of Home: Apartment Home Access: Level entry Home Layout: One level Home Adaptive Equipment: Walker - rolling Prior Function Level of Independence: Independent Driving: No Vocation: Retired Comments: was managing cooking and doing dishes, independent with bathing and dressing; was given a RW after last admission but she hardly uses it Communication Communication: Tracheostomy (has a PMV but didn't use it, able to speak well)    Cognition  Cognition Overall Cognitive Status: Appears within functional limits for tasks assessed/performed Arousal/Alertness: Awake/alert Orientation Level: Appears intact for tasks assessed Behavior During Session: [facility] for tasks performed    Extremity/Trunk Assessment Right Upper Extremity Assessment RUE ROM/Strength/Tone: [facility] for tasks assessed Left Upper Extremity Assessment LUE ROM/Strength/Tone: WFL for tasks assessed Right Lower Extremity Assessment RLE ROM/Strength/Tone: Deficits RLE ROM/Strength/Tone Deficits: generally weak, grossly 4/5 RLE Sensation: History of peripheral neuropathy Left Lower Extremity Assessment LLE ROM/Strength/Tone: Deficits LLE ROM/Strength/Tone Deficits: generally weak, grossly 4/5 LLE Sensation: History of peripheral neuropathy Trunk Assessment Trunk Assessment: Normal   Balance Balance Balance Assessed: Yes Static Standing Balance Static Standing - Balance Support: No upper extremity supported Static Standing - Level of Assistance: 5: Stand by assistance Static Standing - Comment/# of Minutes: gaurded stance  End of Session PT - End of Session Equipment Utilized During Treatment: Gait belt Activity Tolerance: Patient tolerated treatment well Patient left: in bed;with call bell/phone within reach;with family/visitor present (sitting EOB) Nurse Communication: Mobility status  GP     [facility]  HELEN 01/01/2013, 9:06 AM

## 2013-01-01 NOTE — Progress Notes (Signed)
Met with patient and daughter at bedside.  Both very jovial.  Both familiar with the [facility] hospital liaison role.  Patient's daughter desires to receive some additional education on how to manage her mother's diabetic diet.  Informed daughter that her [facility] nurse would continue to explore education and diet alternatives with them upon return to home.  Contact information made available.  Of note, [facility] Care Management services does not replace or interfere with any services that are arranged by inpatient case management or social work.  For additional questions or referrals please contact Anibal Henderson BSN RN [facility] Liaison at [phone].

## 2013-01-01 NOTE — Progress Notes (Signed)
Report is given to incoming nurse, patient resting in bed no acute distress at this time, she has pending a urinalysis, incoming nurse is aware. NS is running at 62ml/hr. Stool culture sent for C-diff. Her K+ 3.6 is currently.

## 2013-01-01 NOTE — Progress Notes (Signed)
I have personally seen and examined this patient and agree with the assessment/plan as outlined above by Dede Query MD (PGY2). Potassium level better with SPE-- plans to NOT RESTART SPIRONOLACTONE.  Renal function remains declined- monitor with gentle IVFs and encourage PO intake. PATEL,JAY K.,MD 01/01/2013 10:18 AM

## 2013-01-01 NOTE — ED Provider Notes (Signed)
Medical screening examination/treatment/procedure(s) were conducted as a shared visit with non-physician practitioner(s) and myself.  I personally evaluated the patient during the encounter  CRITICAL CARE Performed by: Ranae Palms, DAVID   Total critical care time: 30  Critical care time was exclusive of separately billable procedures and treating other patients.  Critical care was necessary to treat or prevent imminent or life-threatening deterioration.  Critical care was time spent personally by me on the following activities: development of treatment plan with patient and/or surrogate as well as nursing, discussions with consultants, evaluation of patient's response to treatment, examination of patient, obtaining history from patient or surrogate, ordering and performing treatments and interventions, ordering and review of laboratory studies, ordering and review of radiographic studies, pulse oximetry and re-evaluation of patient's condition.   Loren Racer, MD 01/01/13 [phone]

## 2013-01-01 NOTE — Progress Notes (Signed)
Utilization Review Completed. 01/01/2013

## 2013-01-01 NOTE — Progress Notes (Signed)
[facility] - PROGRESS NOTE Resident Note   Please see below for attending addendum to resident note.  Subjective:   Feels ok, less nausea. Has loose stools likely due to Kayexalate. K corrected to 3.6 this am.  Objective:    Vital Signs:   Temp:  [98.1 F (36.7 C)-98.2 F (36.8 C)] 98.2 F (36.8 C) (03/11 0400) Pulse Rate:  [62-113] 92 (03/11 0342) Resp:  [11-34] 19 (03/11 0342) BP: (78-167)/(30-82) 78/30 mmHg (03/11 0342) SpO2:  [93 %-100 %] 95 % (03/11 0342) FiO2 (%):  [8 %-28 %] 28 % (03/11 0342) Weight:  [153 lb 3.5 oz (69.5 kg)-158 lb 1.1 oz (71.7 kg)] 158 lb 1.1 oz (71.7 kg) (03/11 0100) Last BM Date: 01/01/13  24-hour weight change: Weight change:   Weight trends: Filed Weights   12/31/12 1827 12/31/12 1839 01/01/13 0100  Weight: 153 lb 14.1 oz (69.8 kg) 153 lb 3.5 oz (69.5 kg) 158 lb 1.1 oz (71.7 kg)    Intake/Output:     Physical Exam:  General:  Vital signs reviewed and noted. in no acute distress; alert, appropriate and cooperative throughout examination.   Head:  Normocephalic, atraumatic.   Eyes:  PERRL, EOMI, No signs of anemia or jaundince.   Nose:  Mucous membranes moist, not inflammed, nonerythematous.   Throat:  Oropharynx nonerythematous, no exudate appreciated. Tracheostomy noted.   Neck:  No deformities, masses, or tenderness noted.Supple, No carotid Bruits, no JVD.   Lungs:  Normal respiratory effort. Clear to auscultation BL without crackles or wheezes.   Heart:  RRR. S1 and S2 normal without gallop, murmur, or rubs.   Abdomen:  BS normoactive. Soft, Nondistended, non-tender. No masses or organomegaly.   Extremities:  No pretibial edema.   Neurologic:  A&O X3, CN II - XII are grossly intact. Motor strength is 5/5 in the all 4 extremities, Sensations intact to light touch, Cerebellar signs negative.   Skin:  No visible rashes, scars.      Labs: Basic Metabolic Panel:  Recent Labs Lab 12/31/12 1237  12/31/12 2015  12/31/12 2320 01/01/13 0450  Kayon Dozier 140  < > 138 140 139  K 6.7*  < > 4.4 3.9 3.6  CL 104  < > 101 101 101  CO2 22  < > 21 26 25   GLUCOSE 208*  < > 278* 205* 144*  BUN 82*  < > 78* 75* 74*  CREATININE 2.52*  < > 2.69*  2.71* 2.73* 2.89*  CALCIUM 9.9  < > 9.7 9.6 9.1  MG 2.5  --   --   --   --   PHOS  --   --   --  4.1  --   < > = values in this interval not displayed.  Liver Function Tests:  Recent Labs Lab 12/31/12 0703 12/31/12 2320  AST 45*  --   ALT 72*  --   ALKPHOS 108  --   BILITOT 0.3  --   PROT 9.0*  --   ALBUMIN 4.4 3.7   No results found for this basename: LIPASE, AMYLASE,  in the last 168 hours No results found for this basename: AMMONIA,  in the last 168 hours  CBC:  Recent Labs Lab 12/31/12 0703 12/31/12 2015 01/01/13 0450  WBC 16.1* 9.8 9.5  NEUTROABS 15.1*  --   --   HGB 12.7 11.4* 11.2*  HCT 38.2 34.2* 33.8*  MCV 88.8 87.5 87.1  PLT 183 182 183    Cardiac Enzymes: No  results found for this basename: CKTOTAL, CKMB, TROPONINI,  in the last 168 hours  BNP: No components found with this basename: POCBNP,   CBG:  Recent Labs Lab 12/31/12 1526 12/31/12 1716 12/31/12 2207 01/01/13 0115 01/01/13 0439  GLUCAP 227* 122* 255* 145* 131*    Microbiology: Results for orders placed during the hospital encounter of 12/31/12  MRSA PCR SCREENING     Status: None   Collection Time    12/31/12  7:28 PM      Result Value Range Status   MRSA by PCR NEGATIVE  NEGATIVE Final   Comment:            The GeneXpert MRSA Assay (FDA     approved for NASAL specimens     only), is one component of a     comprehensive MRSA colonization     surveillance program. It is not     intended to diagnose MRSA     infection nor to guide or     monitor treatment for     MRSA infections.    Coagulation Studies: No results found for this basename: LABPROT, INR,  in the last 72 hours  Urinalysis:    Component Value Date/Time   COLORURINE YELLOW 10/18/2012 2305    APPEARANCEUR CLOUDY* 10/18/2012 2305   LABSPEC 1.023 10/18/2012 2305   PHURINE 5.0 10/18/2012 2305   GLUCOSEU 500* 10/18/2012 2305   HGBUR TRACE* 10/18/2012 2305   BILIRUBINUR NEGATIVE 10/18/2012 2305   KETONESUR NEGATIVE 10/18/2012 2305   PROTEINUR 30* 10/18/2012 2305   UROBILINOGEN 0.2 10/18/2012 2305   NITRITE NEGATIVE 10/18/2012 2305   LEUKOCYTESUR TRACE* 10/18/2012 2305     Imaging: Dg Chest 2 View  12/31/2012  *RADIOLOGY REPORT*  Clinical Data: Tracheostomy, cough  CHEST - 2 VIEW  Comparison: 11/17/2012; 11/15/2012  Findings: Grossly unchanged enlarged cardiac silhouette and mediastinal contours post median sternotomy. Post coronary artery stenting. Stable positioning of support apparatus.  Lung volumes remain reduced with grossly unchanged linear heterogeneous opacities within the left mid lung favored to represent atelectasis.  No new focal airspace opacities.  No definite evidence of pulmonary edema or pneumothorax.  Unchanged bones. Post cholecystectomy.  IMPRESSION: Persistently reduced lung volumes without acute cardiopulmonary disease.   Original Report Authenticated By: Tacey Ruiz, MD    US Renal  12/31/2012  *RADIOLOGY REPORT*  Clinical Data: Acute on chronic renal failure.  Diabetes mellitus.  RENAL/URINARY TRACT ULTRASOUND COMPLETE  Comparison:  None  Findings:  Right Kidney:  10.0 cm in length.  Lower pole cyst measures 2.0 x 1.6 x 1.9 cm.  No solid mass or hydronephrosis. Normal echogenicity.  Left Kidney:  10.3 cm in length.  No hydronephrosis or mass. Normal echogenicity.  Bladder:  Normal in appearance.  IMPRESSION:  1.  Incidental note of a benign right renal cyst. 2.  Normal renal echogenicity bilaterally. 3.  No hydronephrosis.   Original Report Authenticated By: Norva Pavlov, M.D.       Medications:    Infusions: . sodium chloride 50 mL/hr at 12/31/12 1329    Scheduled Medications: . albuterol  2.5 mg Nebulization Q4H  . aspirin EC  81 mg Oral Daily   . carvedilol  25 mg Oral BID WC  . clopidogrel  75 mg Oral Daily  . dextrose  25 mL Intravenous Once  . enoxaparin (LOVENOX) injection  30 mg Subcutaneous Q24H  . ferrous fumarate  1 tablet Oral BID  . insulin aspart  0-5 Units Subcutaneous QHS  .  insulin aspart  0-9 Units Subcutaneous TID WC  . insulin glargine  22 Units Subcutaneous QHS  . simvastatin  40 mg Oral QPM  . sodium chloride  3 mL Intravenous Q12H    PRN Medications: acetaminophen, acetaminophen, albuterol, guaiFENesin, ipratropium, nitroGLYCERIN, ondansetron (ZOFRAN) IV, ondansetron   Assessment/ Plan:    Patient is a 67 y.o. female with a PMHx of CKD, DM, HTN, MI/CAD s/p CABG and stenting, CVA, CHF, COPD, and s/p tracheostomyin the past with trach collar on 3 lit of oxygen, who was admitted to [facility] on 12/31/2012 for evaluation of nausea, vomiting and diarrhea x 1 day. She was noted to have acute renal failure and hyperkalemia.   1. Hyperkalemia, corrected  potassium level was more than 7.5 with EKG changes with widening of the QRS complex on admission. Multiple etiologies including ARF, medication ( Spirolactone and ARB).   - UA pending  - hold home med lasix, spirolactone and ARB.   2. ARF   Recent Labs Lab 12/31/12 1237 12/31/12 1600 12/31/12 2015 12/31/12 2320 01/01/13 0450  CREATININE 2.52* 2.66* 2.69*  2.71* 2.73* 2.89*    - likely associated with volume depletion 2/2 N/V/D and diuresis, ARB, possible ATN  - insert foley catheter  - IVF at 100 ml /hours x 8 hours and then NS at 50 ml/hour  --did not receive IVF last night per nurse report-- will continue IVF at 50 ml/hour  - caution fluid overload due to low EF.   3. Hyponatremia, corrected associated with ARF   4. RBBB and LAFB and prolonged QT--likely associated with hyperkalemia  - cardiology is consulted by primary team -repeat EKG this am  DVT PPX - Given rising Cr and decreased GFR --recs Heparin --> will defer it to primary  team  Length of Stay: 1 days  Patient history and plan of care reviewed with attending, Dr. Allena Katz.   Dede Query, MD  PGYII, Internal Medicine Resident 01/01/2013, 7:00 AM

## 2013-01-02 DIAGNOSIS — R197 Diarrhea, unspecified: Secondary | ICD-10-CM

## 2013-01-02 LAB — BASIC METABOLIC PANEL
CO2: 24 mEq/L (ref 19–32)
Calcium: 8.1 mg/dL — ABNORMAL LOW (ref 8.4–10.5)
Creatinine, Ser: 1.91 mg/dL — ABNORMAL HIGH (ref 0.50–1.10)
Glucose, Bld: 99 mg/dL (ref 70–99)

## 2013-01-02 LAB — URINALYSIS, ROUTINE W REFLEX MICROSCOPIC
Glucose, UA: NEGATIVE mg/dL
Ketones, ur: NEGATIVE mg/dL
Nitrite: POSITIVE — AB
pH: 5 (ref 5.0–8.0)

## 2013-01-02 LAB — URINE MICROSCOPIC-ADD ON

## 2013-01-02 MED ORDER — DEXTROSE 5 % IV SOLN
1.0000 g | INTRAVENOUS | Status: DC
  Administered 2013-01-02: 1 g via INTRAVENOUS
  Filled 2013-01-02 (×2): qty 10

## 2013-01-02 MED ORDER — ALBUTEROL SULFATE (5 MG/ML) 0.5% IN NEBU
2.5000 mg | INHALATION_SOLUTION | Freq: Four times a day (QID) | RESPIRATORY_TRACT | Status: DC
  Administered 2013-01-02 – 2013-01-04 (×7): 2.5 mg via RESPIRATORY_TRACT
  Filled 2013-01-02 (×7): qty 0.5

## 2013-01-02 NOTE — Progress Notes (Signed)
I have personally seen and examined this patient and agree with the assessment/plan as outlined above by Dede Query MD (PGY2).  Renal function improving and Hyperkalemia corrected. Will sign off with the recommendation to NOT RESTART SPIRONOLACTONE  Please call with questions.   PATEL,JAY K.,MD 01/02/2013 9:35 AM

## 2013-01-02 NOTE — Progress Notes (Signed)
RT to see patient. RT noticed pt on room air. Pt states she only wears 02 at night. Vital signs stable at this time. RT will continue to monitor.

## 2013-01-02 NOTE — Progress Notes (Signed)
[facility] Progress Note [name]   [name] VHQ:[id number] DOB: February 20, 1946 DOA: 12/31/2012 PCP: Cala Bradford, MD  Brief narrative: 67 year old female patient who has chronic combined systolic and diastolic heart failure as well as diabetes. She was brought in to the hospital by her daughter due to complaints of persistent diarrhea as well as tingling and numbness in her hands. The daughter recently had gastrointestinal illness on Saturday with similar symptoms. Workup in the ER revealed the patient to be in acute renal failure with hyperkalemia with associated EKG changes. She was admitted to the step down unit.  Assessment/Plan:  Hyperkalemia/Acute renal failure -precipitated by ?gastroenteritis and further exacerbated by Aldactone and Avapro -K+ has normalized w/ hydration and kayexalate -cont. to hold offending meds (Aldactone, Lasix and Avapro) -PERMANENT dc of Aldactone (listed as an allergy as a precaution - explained to pt) -baseline Scr 1.29  Nausea vomiting and diarrhea/Dehydration - resolving -C. Diff negative  Abnormal urinalysis -appears c/w UTI - begin empiric Rocephin  Nonspecific abnormal electrocardiogram (ECG) (EKG)/QT prolongation -due to hyperK+ -resolved     Chronic systolic congestive heart failure, NYHA class 3/EF 20-25% (ECHO 10/2012)/Chronic diastolic heart failure, NYHA class 2 -compensated but cautious administration of IVF's    Diabetes mellitus type 2, uncontrolled -HgbA1c 8.9 in Jan 2014 -cont. Lantus and SSI    Hypertension -BP moderatley controlled -cont Coreg- Avapro on hold 2/2 ARF     Subglottic stenosis w/chronic tracheostomy  -chronic fenestrated trach/PMV w/ nocturnal oxygen      PAD (peripheral artery disease) -cont Plavix/ASA    COPD (chronic obstructive pulmonary disease) -compensated    H/O hyperthyroidism s/p thyroidectomy -TSH normal    Occlusion and stenosis of carotid artery  without mention of cerebral infarction -cont. Plavix/ASA    Anemia -Hgb stable  DVT prophylaxis: Lovenox Code Status: Full Family Communication: Patient and Daughter Disposition Plan: Transfer to Telemetry but keep on Team 1  Consultants: Nephrology-signed off 3/12 Cardiology  Procedures: None  Antibiotics: Rocephin 3/12 >>>  HPI/Subjective: Alert and no further diarrhea and no abdominal pain. Previous weaknesses and tingling of extremities have resolved. C/O HA.  Objective: Blood pressure 138/49, pulse 68, temperature 98.1 F (36.7 C), temperature source Oral, resp. rate 19, height 5\' 2"  (1.575 m), weight 70.2 kg (154 lb 12.2 oz), SpO2 100.00%.  Intake/Output Summary (Last 24 hours) at 01/02/13 1133 Last data filed at 01/02/13 0900  Gross per 24 hour  Intake   1500 ml  Output    550 ml  Net    950 ml    Exam: General: No acute respiratory distress Lungs: Clear to auscultation bilaterally without wheezes or crackles, trach midline w/ PMV in place Cardiovascular: Regular rate and rhythm without murmur gallop or rub normal S1 and S2, IVF @ 50/hr, no JVD or peripheral edema Abdomen: Nontender, nondistended, soft, bowel sounds positive, no rebound, no ascites, no appreciable mass Musculoskeletal: No significant cyanosis, clubbing of bilateral lower extremities Neurological: Alert and oriented x3, moves all extremities x4, cranial nerves II through XII grossly intact  Data Reviewed: Basic Metabolic Panel:  Recent Labs Lab 12/31/12 1237  12/31/12 2015 12/31/12 2320 01/01/13 0450 01/01/13 1542 01/02/13 0435  NA 140  < > 138 140 139 137 138  K 6.7*  < > 4.4 3.9 3.6 4.0 3.6  CL 104  < > 101 101 101 100 104  CO2 22  < > 21 26 25 23 24   GLUCOSE 208*  < > 278*  205* 144* 165* 99  BUN 82*  < > 78* 75* 74* 72* 67*  CREATININE 2.52*  < > 2.69*  2.71* 2.73* 2.89* 2.39* 1.91*  CALCIUM 9.9  < > 9.7 9.6 9.1 8.5 8.1*  MG 2.5  --   --   --   --   --   --   PHOS  --   --    --  4.1  --   --   --   < > = values in this interval not displayed. Liver Function Tests:  Recent Labs Lab 12/31/12 0703 12/31/12 2320  AST 45*  --   ALT 72*  --   ALKPHOS 108  --   BILITOT 0.3  --   PROT 9.0*  --   ALBUMIN 4.4 3.7   CBC:  Recent Labs Lab 12/31/12 0703 12/31/12 2015 01/01/13 0450  WBC 16.1* 9.8 9.5  NEUTROABS 15.1*  --   --   HGB 12.7 11.4* 11.2*  HCT 38.2 34.2* 33.8*  MCV 88.8 87.5 87.1  PLT 183 182 183   BNP (last 3 results)  Recent Labs  11/15/12 1254 11/16/12 0232 12/31/12 2014  PROBNP 5763.0* 8676.0* 1860.0*   CBG:  Recent Labs Lab 01/01/13 0746 01/01/13 1232 01/01/13 1623 01/01/13 2135 01/02/13 0749  GLUCAP 153* 144* 130* 149* 75    Recent Results (from the past 240 hour(s))  MRSA PCR SCREENING     Status: None   Collection Time    12/31/12  7:28 PM      Result Value Range Status   MRSA by PCR NEGATIVE  NEGATIVE Final   Comment:            The GeneXpert MRSA Assay (FDA     approved for NASAL specimens     only), is one component of a     comprehensive MRSA colonization     surveillance program. It is not     intended to diagnose MRSA     infection nor to guide or     monitor treatment for     MRSA infections.  CLOSTRIDIUM DIFFICILE BY PCR     Status: None   Collection Time    01/01/13  1:52 AM      Result Value Range Status   C difficile by pcr NEGATIVE  NEGATIVE Final     Studies:  Recent x-ray studies have been reviewed in detail by the Attending Physician  Scheduled Meds:  Reviewed in detail by the Attending Physician   Junious Silk, ANP [facility] Office  [phone] Pager [phone]  On-Call/Text Page:      [name]      password [email or web address]  If 7PM-7AM, please contact night-coverage [email or web address] Password [name] 01/02/2013, 11:33 AM   LOS: 2 days   I have personally examined this patient and reviewed the entire database. I have reviewed the above note, made any necessary editorial  changes, and agree with its content.  Lonia Blood, MD [facility]

## 2013-01-02 NOTE — Progress Notes (Signed)
Subjective:  Feels well. Back to baseline. Sitting up eating.   Objective:  Vital Signs in the last 24 hours: Temp:  [98 F (36.7 C)-98.7 F (37.1 C)] 98.1 F (36.7 C) (03/12 0748) Pulse Rate:  [62-98] 68 (03/12 0748) Resp:  [12-30] 19 (03/12 0748) BP: (94-150)/(39-127) 138/49 mmHg (03/12 0748) SpO2:  [95 %-100 %] 100 % (03/12 0748) FiO2 (%):  [28 %] 28 % (03/12 0748) Weight:  [70.2 kg (154 lb 12.2 oz)] 70.2 kg (154 lb 12.2 oz) (03/12 0447)  Intake/Output from previous day: 03/11 0701 - 03/12 0700 In: 1770 [P.O.:720; I.V.:1050] Out: 550 [Urine:550]   Physical Exam: General: Well developed, well nourished, in no acute distress. Head:  Normocephalic and atraumatic.Trach Lungs: Clear to auscultation and percussion. Heart: Normal S1 and S2.  No murmur, rubs or gallops.  Abdomen: soft, non-tender, positive bowel sounds. Extremities: No clubbing or cyanosis. No edema. Neurologic: Alert and oriented x 3.    Lab Results:  Recent Labs  12/31/12 2015 01/01/13 0450  WBC 9.8 9.5  HGB 11.4* 11.2*  PLT 182 183    Recent Labs  01/01/13 1542 01/02/13 0435  NA 137 138  K 4.0 3.6  CL 100 104  CO2 23 24  GLUCOSE 165* 99  BUN 72* 67*  CREATININE 2.39* 1.91*   No results found for this basename: TROPONINI, CK, MB,  in the last 72 hours Hepatic Function Panel  Recent Labs  12/31/12 0703 12/31/12 2320  PROT 9.0*  --   ALBUMIN 4.4 3.7  AST 45*  --   ALT 72*  --   ALKPHOS 108  --   BILITOT 0.3  --   Telemetry: NSR Personally viewed.     Cardiac Studies:  EF 25%  Assessment/Plan:  Active Problems:   Chronic systolic congestive heart failure, NYHA class 3/EF 20-25% (ECHO 10/2012)   PAD (peripheral artery disease)   Diabetes mellitus type 2, uncontrolled   Hypertension   COPD (chronic obstructive pulmonary disease)   H/O hyperthyroidism s/p thyroidectomy   Hyperkalemia   Acute renal failure   Subglottic stenosis w/chronic tracheostomy   Occlusion and stenosis  of carotid artery without mention of cerebral infarction   Anemia   Chronic diastolic heart failure, NYHA class 2   Nausea vomiting and diarrhea   Dehydration   Nonspecific abnormal electrocardiogram (ECG) (EKG)/QT prolongation   -Improved. Continue to hold ARB irbesartan and lasix. NO MORE SPIRONOLACTONE. Told her this.  -OK from my standpoint for DC when comfortable from primary team's view.  -ARF - creat 2.9 down to 1.9  Will get close follow up.   SKAINS, MARK 01/02/2013, 8:29 AM

## 2013-01-02 NOTE — Progress Notes (Signed)
Pharmacist Heart Failure Core Measure Documentation  Assessment: [name] has an EF documented as 25-30% on 11/16/12 by ECHO.  Rationale: Heart failure patients with left ventricular systolic dysfunction (LVSD) and an EF < 40% should be prescribed an angiotensin converting enzyme inhibitor (ACEI) or angiotensin receptor blocker (ARB) at discharge unless a contraindication is documented in the medical record.  This patient is not currently on an ACEI or ARB for HF.  This note is being placed in the record in order to provide documentation that a contraindication to the use of these agents is present for this encounter.  ACE Inhibitor or Angiotensin Receptor Blocker is contraindicated (specify all that apply)  []   ACEI allergy AND ARB allergy []   Angioedema []   Moderate or severe aortic stenosis [x]   Hyperkalemia []   Hypotension []   Renal artery stenosis [x]   Worsening renal function, preexisting renal disease or dysfunction   Madolyn Frieze 01/02/2013 10:20 AM

## 2013-01-02 NOTE — Progress Notes (Signed)
[facility] - PROGRESS NOTE Resident Note   Please see below for attending addendum to resident note.  Subjective:   Doing ok. No c/o, no N/V/D  Objective:    Vital Signs:   Temp:  [98 F (36.7 C)-98.7 F (37.1 C)] 98.1 F (36.7 C) (03/12 0748) Pulse Rate:  [62-98] 68 (03/12 0748) Resp:  [12-30] 19 (03/12 0748) BP: (94-150)/(39-127) 138/49 mmHg (03/12 0748) SpO2:  [95 %-100 %] 100 % (03/12 0748) FiO2 (%):  [28 %] 28 % (03/12 0748) Weight:  [154 lb 12.2 oz (70.2 kg)] 154 lb 12.2 oz (70.2 kg) (03/12 0447) Last BM Date: 01/01/13  24-hour weight change: Weight change: 14.1 oz (0.4 kg)  Weight trends: Filed Weights   12/31/12 1839 01/01/13 0100 01/02/13 0447  Weight: 153 lb 3.5 oz (69.5 kg) 158 lb 1.1 oz (71.7 kg) 154 lb 12.2 oz (70.2 kg)    Intake/Output:  03/11 0701 - 03/12 0700 In: 1770 [P.O.:720; I.V.:1050] Out: 550 [Urine:550]  Physical Exam:  General:  Vital signs reviewed and noted. in no acute distress; alert, appropriate and cooperative throughout examination.   Head:  Normocephalic, atraumatic.   Eyes:  PERRL, EOMI, No signs of anemia or jaundince.   Nose:  Mucous membranes moist, not inflammed, nonerythematous.   Throat:  Oropharynx nonerythematous, no exudate appreciated. Tracheostomy noted.   Neck:  No deformities, masses, or tenderness noted.Supple, No carotid Bruits, no JVD.   Lungs:  Normal respiratory effort. Clear to auscultation BL without crackles or wheezes.   Heart:  RRR. S1 and S2 normal without gallop, murmur, or rubs.   Abdomen:  BS normoactive. Soft, Nondistended, non-tender. No masses or organomegaly.   Extremities:  No pretibial edema.   Neurologic:  A&O X3, CN II - XII are grossly intact. Motor strength is 5/5 in the all 4 extremities, Sensations intact to light touch, Cerebellar signs negative.   Skin:  No visible rashes, scars.     Labs: Basic Metabolic Panel:  Recent Labs Lab 12/31/12 1237  12/31/12 2015  12/31/12 2320 01/01/13 0450 01/01/13 1542 01/02/13 0435  NA 140  < > 138 140 139 137 138  K 6.7*  < > 4.4 3.9 3.6 4.0 3.6  CL 104  < > 101 101 101 100 104  CO2 22  < > 21 26 25 23 24   GLUCOSE 208*  < > 278* 205* 144* 165* 99  BUN 82*  < > 78* 75* 74* 72* 67*  CREATININE 2.52*  < > 2.69*  2.71* 2.73* 2.89* 2.39* 1.91*  CALCIUM 9.9  < > 9.7 9.6 9.1 8.5 8.1*  MG 2.5  --   --   --   --   --   --   PHOS  --   --   --  4.1  --   --   --   < > = values in this interval not displayed.  Liver Function Tests:  Recent Labs Lab 12/31/12 0703 12/31/12 2320  AST 45*  --   ALT 72*  --   ALKPHOS 108  --   BILITOT 0.3  --   PROT 9.0*  --   ALBUMIN 4.4 3.7   No results found for this basename: LIPASE, AMYLASE,  in the last 168 hours No results found for this basename: AMMONIA,  in the last 168 hours  CBC:  Recent Labs Lab 12/31/12 0703 12/31/12 2015 01/01/13 0450  WBC 16.1* 9.8 9.5  NEUTROABS 15.1*  --   --  HGB 12.7 11.4* 11.2*  HCT 38.2 34.2* 33.8*  MCV 88.8 87.5 87.1  PLT 183 182 183    Cardiac Enzymes: No results found for this basename: CKTOTAL, CKMB, TROPONINI,  in the last 168 hours  BNP: No components found with this basename: POCBNP,   CBG:  Recent Labs Lab 01/01/13 0439 01/01/13 0746 01/01/13 1232 01/01/13 1623 01/01/13 2135  GLUCAP 131* 153* 144* 130* 149*    Microbiology: Results for orders placed during the hospital encounter of 12/31/12  MRSA PCR SCREENING     Status: None   Collection Time    12/31/12  7:28 PM      Result Value Range Status   MRSA by PCR NEGATIVE  NEGATIVE Final   Comment:            The GeneXpert MRSA Assay (FDA     approved for NASAL specimens     only), is one component of a     comprehensive MRSA colonization     surveillance program. It is not     intended to diagnose MRSA     infection nor to guide or     monitor treatment for     MRSA infections.  CLOSTRIDIUM DIFFICILE BY PCR     Status: None   Collection Time     01/01/13  1:52 AM      Result Value Range Status   C difficile by pcr NEGATIVE  NEGATIVE Final    Coagulation Studies: No results found for this basename: LABPROT, INR,  in the last 72 hours  Urinalysis:    Component Value Date/Time   COLORURINE YELLOW 01/01/2013 2208   APPEARANCEUR CLOUDY* 01/01/2013 2208   LABSPEC 1.018 01/01/2013 2208   PHURINE 5.0 01/01/2013 2208   GLUCOSEU NEGATIVE 01/01/2013 2208   HGBUR TRACE* 01/01/2013 2208   BILIRUBINUR NEGATIVE 01/01/2013 2208   KETONESUR NEGATIVE 01/01/2013 2208   PROTEINUR NEGATIVE 01/01/2013 2208   UROBILINOGEN 0.2 01/01/2013 2208   NITRITE POSITIVE* 01/01/2013 2208   LEUKOCYTESUR LARGE* 01/01/2013 2208     Imaging: US Renal  12/31/2012  *RADIOLOGY REPORT*  Clinical Data: Acute on chronic renal failure.  Diabetes mellitus.  RENAL/URINARY TRACT ULTRASOUND COMPLETE  Comparison:  None  Findings:  Right Kidney:  10.0 cm in length.  Lower pole cyst measures 2.0 x 1.6 x 1.9 cm.  No solid mass or hydronephrosis. Normal echogenicity.  Left Kidney:  10.3 cm in length.  No hydronephrosis or mass. Normal echogenicity.  Bladder:  Normal in appearance.  IMPRESSION:  1.  Incidental note of a benign right renal cyst. 2.  Normal renal echogenicity bilaterally. 3.  No hydronephrosis.   Original Report Authenticated By: Norva Pavlov, M.D.       Medications:    Infusions: . sodium chloride 50 mL/hr at 01/02/13 0533    Scheduled Medications: . albuterol  2.5 mg Nebulization Q4H  . aspirin EC  81 mg Oral Daily  . carvedilol  25 mg Oral BID WC  . clopidogrel  75 mg Oral Daily  . dextrose  25 mL Intravenous Once  . enoxaparin (LOVENOX) injection  30 mg Subcutaneous Q24H  . ferrous fumarate  1 tablet Oral BID  . insulin aspart  0-5 Units Subcutaneous QHS  . insulin aspart  0-9 Units Subcutaneous TID WC  . insulin glargine  22 Units Subcutaneous QHS  . pneumococcal 23 valent vaccine  0.5 mL Intramuscular Tomorrow-1000  . simvastatin  40 mg Oral QPM   . sodium chloride  3 mL Intravenous Q12H    PRN Medications: acetaminophen, acetaminophen, albuterol, guaiFENesin, ipratropium, nitroGLYCERIN, ondansetron (ZOFRAN) IV, ondansetron, traMADol   Assessment/ Plan:   Patient is a 67 y.o. female with a PMHx of CKD, DM, HTN, MI/CAD s/p CABG and stenting, CVA, CHF, COPD, and s/p tracheostomyin the past with trach collar on 3 lit of oxygen, who was admitted to [facility] on 12/31/2012 for evaluation of nausea, vomiting and diarrhea x 1 day. She was noted to have acute renal failure and hyperkalemia.   1. Hyperkalemia, corrected  potassium level was more than 7.5 with EKG changes with widening of the QRS complex on admission. Multiple etiologies including ARF, medication ( Spirolactone and ARB).  - - hold home med lasix, spirolactone and ARB.  - renal will sign off - call renal if needed in future 2. ARF   Recent Labs Lab 12/31/12 2015 12/31/12 2320 01/01/13 0450 01/01/13 1542 01/02/13 0435  CREATININE 2.69*  2.71* 2.73* 2.89* 2.39* 1.91*     - likely associated with volume depletion 2/2 N/V/D and diuresis, ARB, possible ATN  - insert foley catheter  - NS at 50 ml/hour  - caution fluid overload due to low EF.   3. Hyponatremia, corrected  associated with ARF   4. RBBB and LAFB and prolonged QT--better --likely associated with hyperkalemia  - cardiology is consulted by primary team   5. Pyuria - Culture pending - defer to primary team  6. Anemia - recs repeat iron panel  DVT PPX - Given rising Cr and decreased GFR  --recs Heparin --> will defer it to primary team       Length of Stay: 2 days  Patient history and plan of care reviewed with attending, Dr. Lowella Curb, MD  PGYII, Internal Medicine Resident 01/02/2013, 8:39 AM

## 2013-01-03 DIAGNOSIS — I2510 Atherosclerotic heart disease of native coronary artery without angina pectoris: Secondary | ICD-10-CM

## 2013-01-03 DIAGNOSIS — I5032 Chronic diastolic (congestive) heart failure: Secondary | ICD-10-CM

## 2013-01-03 LAB — GLUCOSE, CAPILLARY
Glucose-Capillary: 219 mg/dL — ABNORMAL HIGH (ref 70–99)
Glucose-Capillary: 148 mg/dL — ABNORMAL HIGH (ref 70–99)
Glucose-Capillary: 181 mg/dL — ABNORMAL HIGH (ref 70–99)
Glucose-Capillary: 227 mg/dL — ABNORMAL HIGH (ref 70–99)
Glucose-Capillary: 94 mg/dL (ref 70–99)

## 2013-01-03 LAB — URINE CULTURE: Colony Count: >=100000

## 2013-01-03 LAB — BASIC METABOLIC PANEL
BUN: 40 mg/dL — ABNORMAL HIGH (ref 6–23)
CO2: 25 mEq/L (ref 19–32)
Chloride: 108 mEq/L (ref 96–112)
Glucose, Bld: 161 mg/dL — ABNORMAL HIGH (ref 70–99)
Potassium: 4 mEq/L (ref 3.5–5.1)

## 2013-01-03 MED ORDER — INSULIN ASPART 100 UNIT/ML ~~LOC~~ SOLN
4.0000 [IU] | Freq: Three times a day (TID) | SUBCUTANEOUS | Status: DC
  Administered 2013-01-03: 4 [IU] via SUBCUTANEOUS

## 2013-01-03 MED ORDER — ENOXAPARIN SODIUM 40 MG/0.4ML ~~LOC~~ SOLN
40.0000 mg | SUBCUTANEOUS | Status: DC
  Administered 2013-01-03: 40 mg via SUBCUTANEOUS
  Filled 2013-01-03 (×2): qty 0.4

## 2013-01-03 MED ORDER — AMLODIPINE BESYLATE 10 MG PO TABS
10.0000 mg | ORAL_TABLET | Freq: Every day | ORAL | Status: DC
  Administered 2013-01-03 – 2013-01-04 (×2): 10 mg via ORAL
  Filled 2013-01-03 (×2): qty 1

## 2013-01-03 NOTE — Progress Notes (Signed)
Subjective:  Feels well, baseline. No SOB Creat is now at baseline.   Objective:  Vital Signs in the last 24 hours: Temp:  [97.2 F (36.2 C)-98.6 F (37 C)] 97.9 F (36.6 C) (03/13 0543) Pulse Rate:  [63-81] 70 (03/13 0543) Resp:  [15-23] 18 (03/13 0543) BP: (121-172)/(54-92) 170/64 mmHg (03/13 0543) SpO2:  [94 %-100 %] 100 % (03/13 0543) FiO2 (%):  [21 %-28 %] 28 % (03/13 0543) Weight:  [71.305 kg (157 lb 3.2 oz)-71.578 kg (157 lb 12.8 oz)] 71.305 kg (157 lb 3.2 oz) (03/13 0543)  Intake/Output from previous day: 03/12 0701 - 03/13 0700 In: 560 [P.O.:480; I.V.:80] Out: 1250 [Urine:1250]   Physical Exam: General: Well developed, well nourished, in no acute distress. Head:  Normocephalic and atraumatic.Trach Lungs: Clear to auscultation and percussion. Heart: Normal S1 and S2.  No murmur, rubs or gallops.  Abdomen: soft, non-tender, positive bowel sounds. Extremities: No clubbing or cyanosis. No edema. Neurologic: Alert and oriented x 3.    Lab Results:  Recent Labs  12/31/12 2015 01/01/13 0450  WBC 9.8 9.5  HGB 11.4* 11.2*  PLT 182 183    Recent Labs  01/02/13 0435 01/03/13 0450  NA 138 141  K 3.6 4.0  CL 104 108  CO2 24 25  GLUCOSE 99 161*  BUN 67* 40*  CREATININE 1.91* 1.25*   Telemetry: no adverse rhythm Personally viewed.    Cardiac Studies:  EF 25%  Assessment/Plan:  Active Problems:   Chronic systolic congestive heart failure, NYHA class 3/EF 20-25% (ECHO 10/2012)   PAD (peripheral artery disease)   Diabetes mellitus type 2, uncontrolled   Hypertension   COPD (chronic obstructive pulmonary disease)   H/O hyperthyroidism s/p thyroidectomy   Hyperkalemia   Acute renal failure   Subglottic stenosis w/chronic tracheostomy   Occlusion and stenosis of carotid artery without mention of cerebral infarction   Anemia   Chronic diastolic heart failure, NYHA class 2   Nausea vomiting and diarrhea   Dehydration   Nonspecific abnormal  electrocardiogram (ECG) (EKG)/QT prolongation  -Creat back to baseline. OK to restart irbesartan or should we wait? BP elevated. Would be good for cardiomyopathy.  -Could consider amlodipine (no renal influence) for the time being.   -Continue to hold lasix, NO ALDACTONE.   -Lasix can be restarted as outpt.    - Will repeat echo as outpt. If <35%, will refer for ICD.   -Hyperkalemia resolved.   SKAINS, MARK 01/03/2013, 8:05 AM

## 2013-01-03 NOTE — Progress Notes (Signed)
Physical Therapy Treatment Patient Details Name: Camera Krienke MRN: [id number] DOB: 09-03-1946 Today's Date: 01/03/2013 Time: 6295-2841 PT Time Calculation (min): 28 min  PT Assessment / Plan / Recommendation Comments on Treatment Session  67 y/o female adm. with hyperkalemia. Progressing well with ambulation but still weak and slightly off balance, this improves when she uses her walker. May need to use RW at home especially when there by herself. This was discussed with patient and she agrees.     Follow Up Recommendations  Home health PT     Does the patient have the potential to tolerate intense rehabilitation     Barriers to Discharge        Equipment Recommendations  None recommended by PT    Recommendations for Other Services    Frequency Min 3X/week   Plan Discharge plan remains appropriate;Frequency remains appropriate    Precautions / Restrictions Precautions Precautions: Fall Restrictions Weight Bearing Restrictions: No   Pertinent Vitals/Pain Denies pain, chronic trach collar but ambulates well on RA (maintained SaO2 over 90%), did become dyspneic towards the end of our session    Mobility  Bed Mobility Bed Mobility: Not assessed Transfers Transfers: Sit to Stand;Stand to Sit Sit to Stand: With upper extremity assist;6: Modified independent (Device/Increase time) Stand to Sit: 6: Modified independent (Device/Increase time);With upper extremity assist Ambulation/Gait Ambulation/Gait Assistance: 5: Supervision;4: Min guard Ambulation Distance (Feet): 300 Feet Assistive device: Rolling walker (pushing IV pole) Ambulation/Gait Assistance Details: without AD pt pushing IV pole, staggers occasionally, slower speed; with RW pt with improved fluidity of gait and appears more comfortable and confident (reports improved confidence); did get SOB toward end of ambulation however SaO2 97% when we finished Gait velocity: decreased    Exercises General Exercises - Lower  Extremity Long Arc Quad: AROM;Both;10 reps;Seated;Limitations Long [employer] Limitations: with flexed foot to stretch hamstrings/gastroc     PT Goals Acute Rehab PT Goals PT Goal: Sit to Stand - Progress: Met PT Goal: Stand to Sit - Progress: Met PT Transfer Goal: Bed to Chair/Chair to Bed - Progress: Progressing toward goal PT Goal: Ambulate - Progress: Progressing toward goal PT Goal: Perform Home Exercise Program - Progress: Progressing toward goal  Visit Information  Last PT Received On: 01/03/13 Assistance Needed: +1    Subjective Data  Subjective: Im sorry if I was ugly the other day. Patient Stated Goal: home   Cognition  Cognition Overall Cognitive Status: Appears within functional limits for tasks assessed/performed Arousal/Alertness: Awake/alert Orientation Level: Appears intact for tasks assessed Behavior During Session: [facility] for tasks performed    Balance     End of Session PT - End of Session Equipment Utilized During Treatment: Gait belt Activity Tolerance: Patient tolerated treatment well Patient left: in bed;with call bell/phone within reach;Other (comment) (set up for bathing) Nurse Communication: Mobility status   GP     [facility] HELEN 01/03/2013, 1:54 PM

## 2013-01-03 NOTE — Progress Notes (Signed)
[facility] Progress Note [place] TEAM 1 - Stepdown/ICU TEAM   Lynnea Vandervoort RUE:[id number] DOB: Sep 02, 1946 DOA: 12/31/2012 PCP: Cala Bradford, MD  Brief narrative: 67 year old female patient who has chronic combined systolic and diastolic heart failure as well as diabetes. She was brought in to the hospital by her daughter due to complaints of persistent diarrhea as well as tingling and numbness in her hands. The daughter recently had gastrointestinal illness on Saturday with similar symptoms. Workup in the ER revealed the patient to be in acute renal failure with hyperkalemia with associated EKG changes. She was admitted to the step down unit.  Assessment/Plan:  Hyperkalemia/Acute renal failure -precipitated by ?gastroenteritis and further exacerbated by Aldactone and Avapro -K+ has normalized w/ hydration and kayexalate -cont. to hold offending meds (Aldactone, Lasix and Avapro) -PERMANENT dc of Aldactone (listed as an allergy as a precaution - explained to pt) -baseline Scr 1.29-BUN still elevated so cont. IVF's  Nausea vomiting and diarrhea/Dehydration - resolving -C. Diff negative  Abnormal urinalysis -appeared c/w UTI  But cx negative-  Dc empiric Rocephin  Nonspecific abnormal electrocardiogram (ECG) (EKG)/QT prolongation -due to hyperK+ -resolved     Chronic systolic congestive heart failure, NYHA class 3/EF 20-25% (ECHO 10/2012)/Chronic diastolic heart failure, NYHA class 2 -compensated but cautious administration of IVF's -Cards rec resume Lasix as OP- can follow closely w/ cards after dc -plan to repeat ECHO as OP and if <35% will refer for ICD    Diabetes mellitus type 2, uncontrolled -HgbA1c 8.9 in Jan 2014 -cont. Lantus and SSI    Hypertension -BP moderatley controlled -cont Coreg- Avapro on hold 2/2 ARF -likely can resume 3/14 -begin Norvasc as rec.by Cardiology but would dc when Avapro resumed    Subglottic stenosis w/chronic tracheostomy   -chronic fenestrated trach/PMV w/ nocturnal oxygen      PAD (peripheral artery disease) -cont Plavix/ASA    COPD (chronic obstructive pulmonary disease) -compensated    H/O hyperthyroidism s/p thyroidectomy -TSH normal    Occlusion and stenosis of carotid artery without mention of cerebral infarction -cont. Plavix/ASA    Anemia -Hgb stable  DVT prophylaxis: Lovenox Code Status: Full Family Communication: Patient and Daughter Disposition Plan: Telemetry but keep on Team 1  Consultants: Nephrology-signed off 3/12 Cardiology  Procedures: None  Antibiotics: Rocephin 3/12 >>>  HPI/Subjective: Alert and no further diarrhea and no abdominal pain. No SOB or orthopnea.  Objective: Blood pressure 152/63, pulse 80, temperature 97.8 F (36.6 C), temperature source Oral, resp. rate 18, height 5\' 2"  (1.575 m), weight 71.305 kg (157 lb 3.2 oz), SpO2 98.00%.  Intake/Output Summary (Last 24 hours) at 01/03/13 1443 Last data filed at 01/03/13 1323  Gross per 24 hour  Intake    800 ml  Output   1000 ml  Net   -200 ml    Exam: General: No acute respiratory distress Lungs: Clear to auscultation bilaterally without wheezes or crackles, trach midline w/ PMV in place Cardiovascular: Regular rate and rhythm without murmur gallop or rub normal S1 and S2, IVF @ 50/hr, no JVD or peripheral edema Abdomen: Nontender, nondistended, soft, bowel sounds positive, no rebound, no ascites, no appreciable mass Musculoskeletal: No significant cyanosis, clubbing of bilateral lower extremities Neurological: Alert and oriented x3, moves all extremities x4, cranial nerves II through XII grossly intact  Data Reviewed: Basic Metabolic Panel:  Recent Labs Lab 12/31/12 1237  12/31/12 2015 12/31/12 2320 01/01/13 0450 01/01/13 1542 01/02/13 0435 01/03/13 0450  NA 140  < > 138 140 139  137 138 141  K 6.7*  < > 4.4 3.9 3.6 4.0 3.6 4.0  CL 104  < > 101 101 101 100 104 108  CO2 22  < > 21 26 25 23  24 25   GLUCOSE 208*  < > 278* 205* 144* 165* 99 161*  BUN 82*  < > 78* 75* 74* 72* 67* 40*  CREATININE 2.52*  < > 2.69*  2.71* 2.73* 2.89* 2.39* 1.91* 1.25*  CALCIUM 9.9  < > 9.7 9.6 9.1 8.5 8.1* 8.3*  MG 2.5  --   --   --   --   --   --   --   PHOS  --   --   --  4.1  --   --   --   --   < > = values in this interval not displayed. Liver Function Tests:  Recent Labs Lab 12/31/12 0703 12/31/12 2320  AST 45*  --   ALT 72*  --   ALKPHOS 108  --   BILITOT 0.3  --   PROT 9.0*  --   ALBUMIN 4.4 3.7   CBC:  Recent Labs Lab 12/31/12 0703 12/31/12 2015 01/01/13 0450  WBC 16.1* 9.8 9.5  NEUTROABS 15.1*  --   --   HGB 12.7 11.4* 11.2*  HCT 38.2 34.2* 33.8*  MCV 88.8 87.5 87.1  PLT 183 182 183   BNP (last 3 results)  Recent Labs  11/15/12 1254 11/16/12 0232 12/31/12 2014  PROBNP 5763.0* 8676.0* 1860.0*   CBG:  Recent Labs Lab 01/02/13 1239 01/02/13 1727 01/02/13 2205 01/03/13 0625 01/03/13 1119  GLUCAP 199* 219* 219* 148* 181*    Recent Results (from the past 240 hour(s))  MRSA PCR SCREENING     Status: None   Collection Time    12/31/12  7:28 PM      Result Value Range Status   MRSA by PCR NEGATIVE  NEGATIVE Final   Comment:            The GeneXpert MRSA Assay (FDA     approved for NASAL specimens     only), is one component of a     comprehensive MRSA colonization     surveillance program. It is not     intended to diagnose MRSA     infection nor to guide or     monitor treatment for     MRSA infections.  CLOSTRIDIUM DIFFICILE BY PCR     Status: None   Collection Time    01/01/13  1:52 AM      Result Value Range Status   C difficile by pcr NEGATIVE  NEGATIVE Final  URINE CULTURE     Status: None   Collection Time    01/01/13 10:00 PM      Result Value Range Status   Specimen Description URINE, CLEAN CATCH   Final   Special Requests NONE   Final   Culture  Setup Time 01/01/2013 22:00   Final   Colony Count >=100,000 COLONIES/ML   Final    Culture     Final   Value: Multiple bacterial morphotypes present, none predominant. Suggest appropriate recollection if clinically indicated.   Report Status 01/03/2013 FINAL   Final     Studies:  Recent x-ray studies have been reviewed in detail by the Attending Physician  Scheduled Meds:  Reviewed in detail by the Attending Physician   Junious Silk, ANP [facility] Office  [phone] Pager [phone]  On-Call/Text  Page:      [name]      password [email or web address]  If 7PM-7AM, please contact night-coverage [email or web address] Password [facility] 01/03/2013, 2:43 PM   LOS: 3 days    I have examined the patient, reviewed the chart and modified the above note which I agree with.   RIZWAN,SAIMA,MD [phone] 01/03/2013, 6:58 PM

## 2013-01-03 NOTE — Progress Notes (Addendum)
Inpatient Diabetes Program Recommendations  AACE/ADA: New Consensus Statement on Inpatient Glycemic Control (2013)  Target Ranges:  Prepandial:   less than 140 mg/dL      Peak postprandial:   less than 180 mg/dL (1-2 hours)      Critically ill patients:  140 - 180 mg/dL    Results for AKANSHA, WYCHE (MRN [id number]) as of 01/03/2013 10:21  Ref. Range 01/02/2013 07:49 01/02/2013 12:39 01/02/2013 17:27 01/02/2013 22:05  Glucose-Capillary Latest Range: 70-99 mg/dL 75 409 (H) 811 (H) 914 (H)   Fasting glucose WNL.  Patient having issues with glucose elevations after meals.  PO intake 80-100% of meals.  Recommend the following in-hospital insulin adjustments: 1. Please consider adding meal coverage- Novolog 4 units tid with meals  Will follow. Ambrose Finland RN, MSN, CDE Diabetes Coordinator Inpatient Diabetes Program [phone]

## 2013-01-04 LAB — BASIC METABOLIC PANEL
Chloride: 111 mEq/L (ref 96–112)
Creatinine, Ser: 1.05 mg/dL (ref 0.50–1.10)
GFR calc Af Amer: 63 mL/min — ABNORMAL LOW (ref >90)
Sodium: 143 mEq/L (ref 135–145)

## 2013-01-04 LAB — GLUCOSE, CAPILLARY
Glucose-Capillary: 66 mg/dL — ABNORMAL LOW (ref 70–99)
Glucose-Capillary: 179 mg/dL — ABNORMAL HIGH (ref 70–99)

## 2013-01-04 MED ORDER — FUROSEMIDE 40 MG PO TABS: 40.0000 mg | ORAL_TABLET | Freq: Every day | ORAL | Status: DC

## 2013-01-04 MED ORDER — IRBESARTAN 300 MG PO TABS: 300.0000 mg | ORAL_TABLET | Freq: Every day | ORAL | Status: DC

## 2013-01-04 NOTE — Discharge Summary (Signed)
Physician Discharge Summary  [name] ZOX:[id number] DOB: 1946-06-21 DOA: 12/31/2012  PCP: Cala Bradford, MD  Admit date: 12/31/2012 Discharge date: 01/04/2013  Time spent: 30 minutes  Recommendations for Outpatient Follow-up:  1. Recommend followup electrolyte panel at next M.D. visit on 01/09/2013  Discharge Diagnoses:  Active Problems:   Hyperkalemia/Acute renal failure-resolved   Nausea vomiting and diarrhea/Dehydration-resolved   Nonspecific abnormal electrocardiogram (ECG) (EKG)/QT prolongation   Chronic systolic congestive heart failure, NYHA class 3/EF 20-25% (ECHO 10/2012)   Diabetes mellitus type 2, uncontrolled-improved   Hypertension   Subglottic stenosis w/chronic tracheostomy   Chronic diastolic heart failure, NYHA class 2   PAD (peripheral artery disease)   COPD (chronic obstructive pulmonary disease)   H/O hyperthyroidism s/p thyroidectomy   Occlusion and stenosis of carotid artery without mention of cerebral infarction   Anemia   Discharge Condition: Stable  Diet recommendation: Carb modified heart healthy  Filed Weights   01/02/13 2343 01/03/13 0543 01/04/13 0530  Weight: 71.578 kg (157 lb 12.8 oz) 71.305 kg (157 lb 3.2 oz) 72.712 kg (160 lb 4.8 oz)    History of present illness:  67 year old female patient who has chronic combined systolic and diastolic heart failure as well as diabetes. She was brought in to the hospital by her daughter due to complaints of persistent diarrhea as well as tingling and numbness in her hands. The daughter recently had gastrointestinal illness on Saturday with similar symptoms. Workup in the ER revealed the patient to be in acute renal failure with hyperkalemia with associated EKG changes. She was admitted to the step down unit.   Hospital Course:  Hyperkalemia/Acute renal failure Initially precipitated by gastroenteritis and further exacerbated by concomitant use of Aldactone and Avapro at home. Potassium normalized  after hydration and a dose of Kayexalate. Aldactone Lasix and Avapro were held throughout the hospitalization. Cardiology recommended permanent discontinuation of Aldactone and as precaution this was listed as an allergy in Epic. Baseline serum creatinine 1.29. Patient required IV fluids up until the morning of discharge. Creatinine at time of discharge was 1.05. Plans are to resume Avapro on 01/05/2013 since she had received a dose of a substitute anti hypertensive/Norvasc on the morning of discharge. We will resume her Lasix on 01/06/2013.  Nausea vomiting and diarrhea/Dehydration Self limiting and resolved shortly after admission. C. difficile toxin was negative.  Abnormal urinalysis Appeared consistent with UTI initially but culture was negative. Was treated briefly with empiric Rocephin.  Nonspecific abnormal electrocardiogram (ECG) (EKG)/QT prolongation Was directly related to hyperkalemia at presentation and resolved once potassium level normalized.  Chronic systolic congestive heart failure, NYHA class 3/EF 20-25% (ECHO 10/2012)/Chronic diastolic heart failure, NYHA class 2 Has remained compensated throughout the hospitalization. Lungs were clear on exam on date of discharge. As noted above we'll resume Lasix on 01/06/2013. Cardiology document that they plan to repeat an echocardiogram as an outpatient and at the EF is less than 35% they will further patient for evaluation for ICD placement.  Diabetes mellitus type 2, uncontrolled Hemoglobin A1c was 8.9 during a January admission 2014. Patient was continued on Lantus and sliding scale insulin throughout the hospitalization. CBGs have remained well-controlled.  Hypertension Blood pressure has been moderately controlled this admission. She was continued on her usual carvedilol but the Avapro was held due to acute renal failure. Norvasc was substituted and plans are to resume Avapro as described above.  Subglottic stenosis w/chronic  tracheostomy  Has a chronic fenestrated trach with Passy-Muir valve and utilizes nocturnal oxygen. Respiratory status  has remained stable throughout the hospitalization.  Procedures:  None  Consultations:  Cardiology  Discharge Exam: Filed Vitals:   01/03/13 2139 01/03/13 2329 01/04/13 0318 01/04/13 0530  BP: 139/71   160/67  Pulse: 68 70 67 66  Temp: 97.5 F (36.4 C)   97.9 F (36.6 C)  TempSrc: Oral   Oral  Resp: 16 17 17 18   Height:      Weight:    72.712 kg (160 lb 4.8 oz)  SpO2: 100% 100% 100% 100%   General: No acute respiratory distress  Lungs: Clear to auscultation bilaterally without wheezes or crackles, trach midline w/ PMV in place  Cardiovascular: Regular rate and rhythm without murmur gallop or rub normal S1 and S2, NSL, no JVD or peripheral edema  Abdomen: Nontender, nondistended, soft, bowel sounds positive, no rebound, no ascites, no appreciable mass  Musculoskeletal: No significant cyanosis, clubbing of bilateral lower extremities  Neurological: Alert and oriented x3, moves all extremities x4, cranial nerves II through XII grossly intact   Discharge Instructions      Discharge Orders   Future Orders Complete By Expires     (HEART FAILURE PATIENTS) Call MD:  Anytime you have any of the following symptoms: 1) 3 pound weight gain in 24 hours or 5 pounds in 1 week 2) shortness of breath, with or without a dry hacking cough 3) swelling in the hands, feet or stomach 4) if you have to sleep on extra pillows at night in order to breathe.  As directed     Call MD for:  persistant dizziness or light-headedness  As directed     Diet - low sodium heart healthy  As directed     Diet Carb Modified  As directed     Increase activity slowly  As directed         Medication List    STOP taking these medications       spironolactone 25 MG tablet  Commonly known as:  ALDACTONE      TAKE these medications       acetaminophen 500 MG tablet  Commonly known as:   TYLENOL  Take 500 mg by mouth every 6 (six) hours as needed. For pain     albuterol (2.5 MG/3ML) 0.083% nebulizer solution  Commonly known as:  PROVENTIL  Take 2.5 mg by nebulization 4 (four) times daily.     aspirin EC 81 MG tablet  Take 81 mg by mouth daily.     carvedilol 25 MG tablet  Commonly known as:  COREG  Take 25 mg by mouth 2 (two) times daily with a meal.     clopidogrel 75 MG tablet  Commonly known as:  PLAVIX  Take 75 mg by mouth daily.     ferrous fumarate 325 (106 FE) MG Tabs  Commonly known as:  HEMOCYTE - 106 mg FE  Take 1 tablet (106 mg of iron total) by mouth 2 (two) times daily.     furosemide 40 MG tablet  Commonly known as:  LASIX  Take 1 tablet (40 mg total) by mouth daily. May take 1 additional dose for a weight gain of 2 lbs  Start taking on:  01/06/2013     guaiFENesin 600 MG 12 hr tablet  Commonly known as:  MUCINEX  Take 1 tablet (600 mg total) by mouth 2 (two) times daily as needed.     insulin aspart 100 UNIT/ML injection  Commonly known as:  novoLOG  Inject 2-10 Units into  the skin 3 (three) times daily before meals. Sliding scale as directed     insulin glargine 100 UNIT/ML injection  Commonly known as:  LANTUS  Inject 22 Units into the skin at bedtime.     ipratropium 0.02 % nebulizer solution  Commonly known as:  ATROVENT  Take 0.5 mg by nebulization 4 (four) times daily.     irbesartan 300 MG tablet  Commonly known as:  AVAPRO  Take 1 tablet (300 mg total) by mouth at bedtime.  Start taking on:  01/05/2013     loperamide 2 MG capsule  Commonly known as:  IMODIUM  Take 4 mg by mouth 4 (four) times daily as needed. For diarrhea     multivitamin with minerals Tabs  Take 1 tablet by mouth daily.     nitroGLYCERIN 0.4 MG SL tablet  Commonly known as:  NITROSTAT  Place 0.4 mg under the tongue every 5 (five) minutes as needed. For chest pain     simvastatin 40 MG tablet  Commonly known as:  ZOCOR  Take 40 mg by mouth every  evening.     traMADol 50 MG tablet  Commonly known as:  ULTRAM  Take 50 mg by mouth every 6 (six) hours as needed for pain.       Follow-up Information   Follow up with Donato Schultz, MD On 01/09/2013. (9am)    Contact information:   [address] [facility] [address] [phone]        The results of significant diagnostics from this hospitalization (including imaging, microbiology, ancillary and laboratory) are listed below for reference.    Significant Diagnostic Studies: Dg Chest 2 View  12/31/2012  *RADIOLOGY REPORT*  Clinical Data: Tracheostomy, cough  CHEST - 2 VIEW  Comparison: 11/17/2012; 11/15/2012  Findings: Grossly unchanged enlarged cardiac silhouette and mediastinal contours post median sternotomy. Post coronary artery stenting. Stable positioning of support apparatus.  Lung volumes remain reduced with grossly unchanged linear heterogeneous opacities within the left mid lung favored to represent atelectasis.  No new focal airspace opacities.  No definite evidence of pulmonary edema or pneumothorax.  Unchanged bones. Post cholecystectomy.  IMPRESSION: Persistently reduced lung volumes without acute cardiopulmonary disease.   Original Report Authenticated By: Tacey Ruiz, MD    US Renal  12/31/2012  *RADIOLOGY REPORT*  Clinical Data: Acute on chronic renal failure.  Diabetes mellitus.  RENAL/URINARY TRACT ULTRASOUND COMPLETE  Comparison:  None  Findings:  Right Kidney:  10.0 cm in length.  Lower pole cyst measures 2.0 x 1.6 x 1.9 cm.  No solid mass or hydronephrosis. Normal echogenicity.  Left Kidney:  10.3 cm in length.  No hydronephrosis or mass. Normal echogenicity.  Bladder:  Normal in appearance.  IMPRESSION:  1.  Incidental note of a benign right renal cyst. 2.  Normal renal echogenicity bilaterally. 3.  No hydronephrosis.   Original Report Authenticated By: Norva Pavlov, M.D.     Microbiology: Recent Results (from the past 240 hour(s))  MRSA PCR SCREENING      Status: None   Collection Time    12/31/12  7:28 PM      Result Value Range Status   MRSA by PCR NEGATIVE  NEGATIVE Final   Comment:            The GeneXpert MRSA Assay (FDA     approved for NASAL specimens     only), is one component of a     comprehensive MRSA colonization  surveillance program. It is not     intended to diagnose MRSA     infection nor to guide or     monitor treatment for     MRSA infections.  CLOSTRIDIUM DIFFICILE BY PCR     Status: None   Collection Time    01/01/13  1:52 AM      Result Value Range Status   C difficile by pcr NEGATIVE  NEGATIVE Final  URINE CULTURE     Status: None   Collection Time    01/01/13 10:00 PM      Result Value Range Status   Specimen Description URINE, CLEAN CATCH   Final   Special Requests NONE   Final   Culture  Setup Time 01/01/2013 22:00   Final   Colony Count >=100,000 COLONIES/ML   Final   Culture     Final   Value: Multiple bacterial morphotypes present, none predominant. Suggest appropriate recollection if clinically indicated.   Report Status 01/03/2013 FINAL   Final     Labs: Basic Metabolic Panel:  Recent Labs Lab 12/31/12 1237  12/31/12 2015 12/31/12 2320 01/01/13 0450 01/01/13 1542 01/02/13 0435 01/03/13 0450 01/04/13 0445  NA 140  < > 138 140 139 137 138 141 143  K 6.7*  < > 4.4 3.9 3.6 4.0 3.6 4.0 4.4  CL 104  < > 101 101 101 100 104 108 111  CO2 22  < > 21 26 25 23 24 25 24   GLUCOSE 208*  < > 278* 205* 144* 165* 99 161* 92  BUN 82*  < > 78* 75* 74* 72* 67* 40* 20  CREATININE 2.52*  < > 2.69*  2.71* 2.73* 2.89* 2.39* 1.91* 1.25* 1.05  CALCIUM 9.9  < > 9.7 9.6 9.1 8.5 8.1* 8.3* 8.3*  MG 2.5  --   --   --   --   --   --   --   --   PHOS  --   --   --  4.1  --   --   --   --   --   < > = values in this interval not displayed. Liver Function Tests:  Recent Labs Lab 12/31/12 0703 12/31/12 2320  AST 45*  --   ALT 72*  --   ALKPHOS 108  --   BILITOT 0.3  --   PROT 9.0*  --   ALBUMIN  4.4 3.7   No results found for this basename: LIPASE, AMYLASE,  in the last 168 hours No results found for this basename: AMMONIA,  in the last 168 hours CBC:  Recent Labs Lab 12/31/12 0703 12/31/12 2015 01/01/13 0450  WBC 16.1* 9.8 9.5  NEUTROABS 15.1*  --   --   HGB 12.7 11.4* 11.2*  HCT 38.2 34.2* 33.8*  MCV 88.8 87.5 87.1  PLT 183 182 183   Cardiac Enzymes: No results found for this basename: CKTOTAL, CKMB, CKMBINDEX, TROPONINI,  in the last 168 hours BNP: BNP (last 3 results)  Recent Labs  11/15/12 1254 11/16/12 0232 12/31/12 2014  PROBNP 5763.0* 8676.0* 1860.0*   CBG:  Recent Labs Lab 01/03/13 1628 01/03/13 2132 01/04/13 0634 01/04/13 0728 01/04/13 1127  GLUCAP 227* 94 66* 118* 179*       Signed:  ELLIS,ALLISON L.ANP  [facility] 01/04/2013, 12:08 PM   I have examined the patient, reviewed the chart and modified the above note which I agree with.   RIZWAN,SAIMA,MD [phone] 01/16/2013, 11:24 AM

## 2013-01-04 NOTE — Progress Notes (Signed)
Physical Therapy Treatment Patient Details Name: [name] MRN: [id number] DOB: [birth year] Today's Date: 01/04/2013 Time: 4098-1191 PT Time Calculation (min): 10 min  PT Assessment / Plan / Recommendation Comments on Treatment Session  Pt admitted with hyperkalemia and moving well with gait today. Pt denied further activity or HEP as awaiting family arrival for discharge. pt able  to state and demonstrate HEP and added in seated marching and heel raises for pt to progress activity with discussion of progression from seated to standing activities.     Follow Up Recommendations  No PT follow up     Does the patient have the potential to tolerate intense rehabilitation     Barriers to Discharge        Equipment Recommendations       Recommendations for Other Services    Frequency     Plan Discharge plan needs to be updated;Frequency remains appropriate    Precautions / Restrictions Precautions Precautions: Fall   Pertinent Vitals/Pain Chronic lower back pain 4/10    Mobility  Bed Mobility Bed Mobility: Not assessed Transfers Sit to Stand: 7: Independent;From bed Stand to Sit: 7: Independent;To bed Ambulation/Gait Ambulation/Gait Assistance: 6: Modified independent (Device/Increase time) Ambulation Distance (Feet): 300 Feet Assistive device: None Ambulation/Gait Assistance Details: increased sway but no LOB and pt states she never takes her RW with her but will just sit and rest if she gets tired. pt also states no falls in the last year Gait Pattern: Step-through pattern Gait velocity: decreased Stairs: No    Exercises     PT Diagnosis:    PT Problem List:   PT Treatment Interventions:     PT Goals Acute Rehab PT Goals PT Goal: Ambulate - Progress: Met PT Goal: Perform Home Exercise Program - Progress: Partly met  Visit Information  Last PT Received On: 01/04/13 Assistance Needed: +1    Subjective Data  Subjective: I'm doing fine today just ready to go  home   Cognition  Cognition Overall Cognitive Status: Appears within functional limits for tasks assessed/performed Arousal/Alertness: Awake/alert Orientation Level: Appears intact for tasks assessed Behavior During Session: [facility] for tasks performed    Balance     End of Session PT - End of Session Activity Tolerance: Patient tolerated treatment well Patient left: in bed;with call bell/phone within reach Nurse Communication: Mobility status   GP     Delorse Lek 01/04/2013, 2:16 PM Delaney Meigs, PT [phone]

## 2013-01-04 NOTE — Progress Notes (Signed)
Pt d/c to home with daughter, pt given dc instructions, medications and follow up appointments, pt verbalized understanding, pt left via wheelchair with family, pt stable upon d/c

## 2013-01-17 ENCOUNTER — Ambulatory Visit: Payer: Medicare Other | Admitting: Vascular Surgery

## 2013-01-17 ENCOUNTER — Other Ambulatory Visit: Payer: Medicare Other

## 2013-03-27 ENCOUNTER — Encounter: Payer: Self-pay | Admitting: Vascular Surgery

## 2013-03-28 ENCOUNTER — Ambulatory Visit (INDEPENDENT_AMBULATORY_CARE_PROVIDER_SITE_OTHER): Payer: Medicare Other | Admitting: Vascular Surgery

## 2013-03-28 ENCOUNTER — Encounter: Payer: Self-pay | Admitting: Vascular Surgery

## 2013-03-28 VITALS — BP 167/70 | HR 67 | Resp 16 | Ht 62.0 in | Wt 165.0 lb

## 2013-03-28 DIAGNOSIS — R200 Anesthesia of skin: Secondary | ICD-10-CM | POA: Insufficient documentation

## 2013-03-28 DIAGNOSIS — M79609 Pain in unspecified limb: Secondary | ICD-10-CM | POA: Insufficient documentation

## 2013-03-28 DIAGNOSIS — I739 Peripheral vascular disease, unspecified: Secondary | ICD-10-CM

## 2013-03-28 DIAGNOSIS — R209 Unspecified disturbances of skin sensation: Secondary | ICD-10-CM

## 2013-03-28 DIAGNOSIS — L819 Disorder of pigmentation, unspecified: Secondary | ICD-10-CM

## 2013-03-28 NOTE — Progress Notes (Signed)
[facility] [place] HISTORY AND PHYSICAL   History of Present Illness:  Patient is a 67 y.o. year old female who presents for evaluation of left calf and foot pain. This is worse over the last 2-3 months. The patient previously had a left leg bypass elsewhere several years ago. She was previously seen for evaluation of carotid stenosis a year ago and was scheduled for a carotid arteriogram but decided to cancel her procedure. At that time she was noted to have a decreased ABI in the left leg but was asymptomatic.  The patient experiences pain in her left calf with ambulation at short distances. She has continuous pain in her left foot. She has no open ulcers.  Other medical problems include diabetes, coronary disease (prior right leg vein harvest), peripheral arterial disease, prior stroke, COPD, history of renal insufficiency. These are all currently stable. She is on Plavix for antiplatelet therapy.  Past Medical History  Diagnosis Date  . Diabetes mellitus     on Lantus 30 U   . CAD (coronary artery disease)     S/p CABG and stenting  . PAD (peripheral artery disease)   . Stroke     MRI 11/2011 with remote occipital lobe. MRA with moderate left focal vertebral artery stenosis  . CHF (congestive heart failure) 11/2011    Echo with EF 30-35%, global hypokinesis, and inferior akinesis  . Hyperlipidemia   . DDD (degenerative disc disease), lumbar   . CVA (cerebral vascular accident) 11/2010  . MI (myocardial infarction) 1997  . COPD (chronic obstructive pulmonary disease)   . Anemia   . Chronic kidney disease   . Irregular heart beat   . Hypertension   . GERD (gastroesophageal reflux disease)   . History of IBS   . Hypothyroidism     Goiter  . Thyroid disease   . Carotid artery occlusion     Past Surgical History  Procedure Laterality Date  . Ptca    . Thyroidectomy    . Coronary artery bypass graft      2 vessel  . Carotid endarterectomy  ~2008    Left   .  Cholecystectomy    . Tracheostomy tube placement  01/02/2012  . Angioplasty  0815-2012    Aortogram by Dr. Chad McKenzie ([facility])  . Pr vein bypass graft,aorto-fem-pop      Right common femoral-AK popliteal BPG & Right Popliteal-posterior tibial  . Pr vein bypass graft,aorto-fem-pop      Left Fem-pop BPG     Social History History  Substance Use Topics  . Smoking status: Former Smoker -- 1.00 packs/day for 50 years    Types: Cigarettes    Quit date: 01/24/2011  . Smokeless tobacco: Never Used  . Alcohol Use: No    Family History Family History  Problem Relation Age of Onset  . Hyperlipidemia Mother   . Other Mother     AAA  . Alzheimer's disease Mother   . Heart disease Mother   . Irregular heart beat Mother   . Diabetes Daughter   . Hypertension Daughter     Allergies  Allergies  Allergen Reactions  . Aldactone (Spironolactone)     Severe hyperkalemia   . Crestor (Rosuvastatin Calcium) Other (See Comments)    Muscle Pain  . Lisinopril Other (See Comments)    "Lowers her BP too low" & cough  . Vicodin (Hydrocodone-Acetaminophen) Nausea And Vomiting     Current Outpatient Prescriptions  Medication Sig Dispense Refill  .   acetaminophen (TYLENOL) 500 MG tablet Take 500 mg by mouth every 6 (six) hours as needed. For pain      . albuterol (PROVENTIL) (2.5 MG/3ML) 0.083% nebulizer solution Take 2.5 mg by nebulization 4 (four) times daily.      . aspirin EC 81 MG tablet Take 81 mg by mouth daily.      . carvedilol (COREG) 25 MG tablet Take 25 mg by mouth 2 (two) times daily with a meal.      . clopidogrel (PLAVIX) 75 MG tablet Take 75 mg by mouth daily.      . furosemide (LASIX) 40 MG tablet Take 1 tablet (40 mg total) by mouth daily. May take 1 additional dose for a weight gain of 2 lbs  30 tablet  0  . insulin aspart (NOVOLOG) 100 UNIT/ML injection Inject 2-10 Units into the skin 3 (three) times daily before meals. Sliding scale as directed      . insulin  glargine (LANTUS) 100 UNIT/ML injection Inject 22 Units into the skin at bedtime.      . irbesartan (AVAPRO) 300 MG tablet Take 1 tablet (300 mg total) by mouth at bedtime.    0  . loperamide (IMODIUM) 2 MG capsule Take 4 mg by mouth 4 (four) times daily as needed. For diarrhea      . Multiple Vitamin (MULTIVITAMIN WITH MINERALS) TABS Take 1 tablet by mouth daily.      . nitroGLYCERIN (NITROSTAT) 0.4 MG SL tablet Place 0.4 mg under the tongue every 5 (five) minutes as needed. For chest pain      . simvastatin (ZOCOR) 40 MG tablet Take 40 mg by mouth every evening.       . traMADol (ULTRAM) 50 MG tablet Take 50 mg by mouth every 6 (six) hours as needed for pain.      . ferrous fumarate (HEMOCYTE - 106 MG FE) 325 (106 FE) MG TABS Take 1 tablet (106 mg of iron total) by mouth 2 (two) times daily.  60 each  0  . guaiFENesin (MUCINEX) 600 MG 12 hr tablet Take 1 tablet (600 mg total) by mouth 2 (two) times daily as needed.  60 tablet  0  . ipratropium (ATROVENT) 0.02 % nebulizer solution Take 0.5 mg by nebulization 4 (four) times daily.      . [DISCONTINUED] dexlansoprazole (DEXILANT) 60 MG capsule Take 60 mg by mouth daily.       No current facility-administered medications for this visit.    ROS:   General:  No weight loss, Fever, chills  HEENT: No recent headaches, no nasal bleeding, no visual changes, no sore throat  Neurologic: No dizziness, blackouts, seizures. No recent symptoms of stroke or mini- stroke. No recent episodes of slurred speech, or temporary blindness.  Cardiac: No recent episodes of chest pain/pressure, no shortness of breath at rest.  + shortness of breath with exertion.  Denies history of atrial fibrillation or irregular heartbeat  Vascular: + history of rest pain in feet.  + history of claudication.  No history of non-healing ulcer, No history of DVT   Pulmonary: No home oxygen, no productive cough, no hemoptysis,  No asthma or wheezing  Musculoskeletal:  [x ]  Arthritis, [x ] Low back pain,  [ ] Joint pain  Hematologic:No history of hypercoagulable state.  No history of easy bleeding.  No history of anemia  Gastrointestinal: No hematochezia or melena,  No gastroesophageal reflux, no trouble swallowing  Urinary: [x ] chronic Kidney disease, [ ]   on HD - [ ] MWF or [ ] TTHS, [ ] Burning with urination, [ ] Frequent urination, [ ] Difficulty urinating;   Skin: No rashes  Psychological: No history of anxiety,  No history of depression   Physical Examination  Filed Vitals:   03/28/13 1050  BP: 167/70  Pulse: 67  Resp: 16  Height: 5' 2" (1.575 m)  Weight: 165 lb (74.844 kg)  SpO2: 97%    Body mass index is 30.17 kg/(m^2).  General:  Alert and oriented, no acute distress HEENT: Tracheostomy Neck: Bilateral carotid bruit Pulmonary: Clear to auscultation bilaterally Cardiac: Regular Rate and Rhythm without murmur Abdomen: Soft, non-tender, non-distended, no mass Skin: No rash Extremity Pulses:  2+ radial, brachial, decrease femoral, absent dorsalis pedis, posterior tibial pulses bilaterally Musculoskeletal: No deformity or edema  Neurologic: Upper and lower extremity motor 5/5 and symmetric  DATA: ABIs from 02/23/2012 reviewed 0.27 on the left 0.96 on the right.  Carotid duplex in the time showed 40-60% left internal carotid artery stenosis less than 40% right internal carotid artery stenosis.  70% right internal carotid artery stenosis by CT Angio in April 2013   ASSESSMENT: Rest pain left foot with severely decreased ABI. Asymptomatic moderate to severe carotid stenosis asymptomatic   PLAN:  The patient will be scheduled for aortogram bilateral lower extremity runoff 04/05/2013. Risks benefits possible complications and procedure details were gone over in great length with the patient and her daughter today. They agree to proceed. She will also need reevaluation of her carotid disease but stents this is asymptomatic I believe we can put  this behind her left foot for now.  Charles Fields, MD [facility] [place] Office: [phone] Pager: [phone]  

## 2013-04-01 ENCOUNTER — Other Ambulatory Visit: Payer: Self-pay

## 2013-04-02 ENCOUNTER — Encounter (HOSPITAL_COMMUNITY): Payer: Self-pay | Admitting: Pharmacy Technician

## 2013-04-04 MED ORDER — SODIUM CHLORIDE 0.9 % IV SOLN
INTRAVENOUS | Status: DC
  Administered 2013-04-05: 1000 mL via INTRAVENOUS

## 2013-04-05 ENCOUNTER — Encounter (HOSPITAL_COMMUNITY)
Admission: RE | Disposition: A | Discharge status: Home / Self Care | Payer: Self-pay | Source: Ambulatory Visit | Attending: Vascular Surgery

## 2013-04-05 ENCOUNTER — Ambulatory Visit (HOSPITAL_COMMUNITY)
Admission: RE | Admit: 2013-04-05 | Discharge: 2013-04-05 | Disposition: A | Discharge status: Home / Self Care | Payer: Medicare Other | Source: Ambulatory Visit | Attending: Vascular Surgery | Admitting: Vascular Surgery

## 2013-04-05 DIAGNOSIS — I70229 Atherosclerosis of native arteries of extremities with rest pain, unspecified extremity: Secondary | ICD-10-CM

## 2013-04-05 DIAGNOSIS — J4489 Other specified chronic obstructive pulmonary disease: Secondary | ICD-10-CM | POA: Insufficient documentation

## 2013-04-05 DIAGNOSIS — I2510 Atherosclerotic heart disease of native coronary artery without angina pectoris: Secondary | ICD-10-CM | POA: Insufficient documentation

## 2013-04-05 DIAGNOSIS — I70209 Unspecified atherosclerosis of native arteries of extremities, unspecified extremity: Secondary | ICD-10-CM | POA: Insufficient documentation

## 2013-04-05 DIAGNOSIS — I658 Occlusion and stenosis of other precerebral arteries: Secondary | ICD-10-CM | POA: Insufficient documentation

## 2013-04-05 DIAGNOSIS — E119 Type 2 diabetes mellitus without complications: Secondary | ICD-10-CM | POA: Insufficient documentation

## 2013-04-05 DIAGNOSIS — J449 Chronic obstructive pulmonary disease, unspecified: Secondary | ICD-10-CM | POA: Insufficient documentation

## 2013-04-05 DIAGNOSIS — I708 Atherosclerosis of other arteries: Secondary | ICD-10-CM | POA: Insufficient documentation

## 2013-04-05 DIAGNOSIS — I6529 Occlusion and stenosis of unspecified carotid artery: Secondary | ICD-10-CM | POA: Insufficient documentation

## 2013-04-05 DIAGNOSIS — Z7902 Long term (current) use of antithrombotics/antiplatelets: Secondary | ICD-10-CM | POA: Insufficient documentation

## 2013-04-05 DIAGNOSIS — Z951 Presence of aortocoronary bypass graft: Secondary | ICD-10-CM | POA: Insufficient documentation

## 2013-04-05 DIAGNOSIS — Z8673 Personal history of transient ischemic attack (TIA), and cerebral infarction without residual deficits: Secondary | ICD-10-CM | POA: Insufficient documentation

## 2013-04-05 HISTORY — PX: ABDOMINAL AORTAGRAM: SHX5454

## 2013-04-05 LAB — POCT I-STAT, CHEM 8
BUN: 22 mg/dL (ref 6–23)
Chloride: 105 mEq/L (ref 96–112)
Sodium: 144 mEq/L (ref 135–145)

## 2013-04-05 LAB — POCT ACTIVATED CLOTTING TIME: Activated Clotting Time: 203 seconds

## 2013-04-05 SURGERY — ABDOMINAL AORTAGRAM
Anesthesia: LOCAL | Laterality: Right

## 2013-04-05 MED ORDER — ACETAMINOPHEN 325 MG PO TABS
ORAL_TABLET | ORAL | Status: AC
  Filled 2013-04-05: qty 2

## 2013-04-05 MED ORDER — HYDRALAZINE HCL 20 MG/ML IJ SOLN
INTRAMUSCULAR | Status: AC
  Filled 2013-04-05: qty 1

## 2013-04-05 MED ORDER — LABETALOL HCL 5 MG/ML IV SOLN: 10.0000 mg | INTRAVENOUS | Status: DC | PRN

## 2013-04-05 MED ORDER — ACETAMINOPHEN 325 MG PO TABS
325.0000 mg | ORAL_TABLET | ORAL | Status: DC | PRN
  Administered 2013-04-05: 650 mg via ORAL
  Filled 2013-04-05: qty 2

## 2013-04-05 MED ORDER — MORPHINE SULFATE 10 MG/ML IJ SOLN: 2.0000 mg | INTRAMUSCULAR | Status: DC | PRN

## 2013-04-05 MED ORDER — PHENOL 1.4 % MT LIQD: 1.0000 | OROMUCOSAL | Status: DC | PRN

## 2013-04-05 MED ORDER — LIDOCAINE HCL (PF) 1 % IJ SOLN
INTRAMUSCULAR | Status: AC
  Filled 2013-04-05: qty 30

## 2013-04-05 MED ORDER — MIDAZOLAM HCL 2 MG/2ML IJ SOLN
INTRAMUSCULAR | Status: AC
  Filled 2013-04-05: qty 2

## 2013-04-05 MED ORDER — SODIUM CHLORIDE 0.45 % IV SOLN
INTRAVENOUS | Status: DC
  Administered 2013-04-05: 10:00:00 via INTRAVENOUS

## 2013-04-05 MED ORDER — HEPARIN SODIUM (PORCINE) 1000 UNIT/ML IJ SOLN
INTRAMUSCULAR | Status: AC
  Filled 2013-04-05: qty 1

## 2013-04-05 MED ORDER — IPRATROPIUM BROMIDE 0.02 % IN SOLN
0.5000 mg | Freq: Once | RESPIRATORY_TRACT | Status: AC
  Administered 2013-04-05: 0.5 mg via RESPIRATORY_TRACT

## 2013-04-05 MED ORDER — FENTANYL CITRATE 0.05 MG/ML IJ SOLN
INTRAMUSCULAR | Status: AC
  Filled 2013-04-05: qty 2

## 2013-04-05 MED ORDER — ALBUTEROL SULFATE (5 MG/ML) 0.5% IN NEBU
INHALATION_SOLUTION | RESPIRATORY_TRACT | Status: AC
  Administered 2013-04-05: 2.5 mg
  Filled 2013-04-05: qty 0.5

## 2013-04-05 MED ORDER — ALBUTEROL SULFATE (5 MG/ML) 0.5% IN NEBU: 2.5000 mg | INHALATION_SOLUTION | Freq: Once | RESPIRATORY_TRACT | Status: DC

## 2013-04-05 MED ORDER — HYDRALAZINE HCL 20 MG/ML IJ SOLN
10.0000 mg | INTRAMUSCULAR | Status: DC | PRN
  Administered 2013-04-05 (×2): 10 mg via INTRAVENOUS

## 2013-04-05 MED ORDER — ACETAMINOPHEN 325 MG RE SUPP
325.0000 mg | RECTAL | Status: DC | PRN
  Filled 2013-04-05: qty 2

## 2013-04-05 MED ORDER — GUAIFENESIN-DM 100-10 MG/5ML PO SYRP: 15.0000 mL | ORAL_SOLUTION | ORAL | Status: DC | PRN

## 2013-04-05 MED ORDER — ONDANSETRON HCL 4 MG/2ML IJ SOLN: 4.0000 mg | Freq: Four times a day (QID) | INTRAMUSCULAR | Status: DC | PRN

## 2013-04-05 MED ORDER — METOPROLOL TARTRATE 1 MG/ML IV SOLN: 2.0000 mg | INTRAVENOUS | Status: DC | PRN

## 2013-04-05 NOTE — H&P (View-Only) (Signed)
[facility] [place] HISTORY AND PHYSICAL   History of Present Illness:  Patient is a 67 y.o. year old female who presents for evaluation of left calf and foot pain. This is worse over the last 2-3 months. The patient previously had a left leg bypass elsewhere several years ago. She was previously seen for evaluation of carotid stenosis a year ago and was scheduled for a carotid arteriogram but decided to cancel her procedure. At that time she was noted to have a decreased ABI in the left leg but was asymptomatic.  The patient experiences pain in her left calf with ambulation at short distances. She has continuous pain in her left foot. She has no open ulcers.  Other medical problems include diabetes, coronary disease (prior right leg vein harvest), peripheral arterial disease, prior stroke, COPD, history of renal insufficiency. These are all currently stable. She is on Plavix for antiplatelet therapy.  Past Medical History  Diagnosis Date  . Diabetes mellitus     on Lantus 30 U   . CAD (coronary artery disease)     S/p CABG and stenting  . PAD (peripheral artery disease)   . Stroke     MRI 11/2011 with remote occipital lobe. MRA with moderate left focal vertebral artery stenosis  . CHF (congestive heart failure) 11/2011    Echo with EF 30-35%, global hypokinesis, and inferior akinesis  . Hyperlipidemia   . DDD (degenerative disc disease), lumbar   . CVA (cerebral vascular accident) 11/2010  . MI (myocardial infarction) 1997  . COPD (chronic obstructive pulmonary disease)   . Anemia   . Chronic kidney disease   . Irregular heart beat   . Hypertension   . GERD (gastroesophageal reflux disease)   . History of IBS   . Hypothyroidism     Goiter  . Thyroid disease   . Carotid artery occlusion     Past Surgical History  Procedure Laterality Date  . Ptca    . Thyroidectomy    . Coronary artery bypass graft      2 vessel  . Carotid endarterectomy  ~2008    Left   .  Cholecystectomy    . Tracheostomy tube placement  01/02/2012  . Angioplasty  [id number]    Aortogram by Dr. [place] McK[facility])  . Pr vein bypass graft,aorto-fem-pop      Right common femoral-AK popliteal BPG & Right Popliteal-posterior tibial  . Pr vein bypass graft,aorto-fem-pop      Left Fem-pop BPG     Social History History  Substance Use Topics  . Smoking status: Former Smoker -- 1.00 packs/day for 50 years    Types: Cigarettes    Quit date: 01/24/2011  . Smokeless tobacco: Never Used  . Alcohol Use: No    Family History Family History  Problem Relation Age of Onset  . Hyperlipidemia Mother   . Other Mother     AAA  . Alzheimer's disease Mother   . Heart disease Mother   . Irregular heart beat Mother   . Diabetes Daughter   . Hypertension Daughter     Allergies  Allergies  Allergen Reactions  . Aldactone (Spironolactone)     Severe hyperkalemia   . Crestor (Rosuvastatin Calcium) Other (See Comments)    Muscle Pain  . Lisinopril Other (See Comments)    "Lowers her BP too low" & cough  . Vicodin (Hydrocodone-Acetaminophen) Nausea And Vomiting     Current Outpatient Prescriptions  Medication Sig Dispense Refill  .  acetaminophen (TYLENOL) 500 MG tablet Take 500 mg by mouth every 6 (six) hours as needed. For pain      . albuterol (PROVENTIL) (2.5 MG/3ML) 0.083% nebulizer solution Take 2.5 mg by nebulization 4 (four) times daily.      Marland Kitchen aspirin EC 81 MG tablet Take 81 mg by mouth daily.      . carvedilol (COREG) 25 MG tablet Take 25 mg by mouth 2 (two) times daily with a meal.      . clopidogrel (PLAVIX) 75 MG tablet Take 75 mg by mouth daily.      . furosemide (LASIX) 40 MG tablet Take 1 tablet (40 mg total) by mouth daily. May take 1 additional dose for a weight gain of 2 lbs  30 tablet  0  . insulin aspart (NOVOLOG) 100 UNIT/ML injection Inject 2-10 Units into the skin 3 (three) times daily before meals. Sliding scale as directed      . insulin  glargine (LANTUS) 100 UNIT/ML injection Inject 22 Units into the skin at bedtime.      . irbesartan (AVAPRO) 300 MG tablet Take 1 tablet (300 mg total) by mouth at bedtime.    0  . loperamide (IMODIUM) 2 MG capsule Take 4 mg by mouth 4 (four) times daily as needed. For diarrhea      . Multiple Vitamin (MULTIVITAMIN WITH MINERALS) TABS Take 1 tablet by mouth daily.      . nitroGLYCERIN (NITROSTAT) 0.4 MG SL tablet Place 0.4 mg under the tongue every 5 (five) minutes as needed. For chest pain      . simvastatin (ZOCOR) 40 MG tablet Take 40 mg by mouth every evening.       . traMADol (ULTRAM) 50 MG tablet Take 50 mg by mouth every 6 (six) hours as needed for pain.      . ferrous fumarate (HEMOCYTE - 106 MG FE) 325 (106 FE) MG TABS Take 1 tablet (106 mg of iron total) by mouth 2 (two) times daily.  60 each  0  . guaiFENesin (MUCINEX) 600 MG 12 hr tablet Take 1 tablet (600 mg total) by mouth 2 (two) times daily as needed.  60 tablet  0  . ipratropium (ATROVENT) 0.02 % nebulizer solution Take 0.5 mg by nebulization 4 (four) times daily.      . [DISCONTINUED] dexlansoprazole (DEXILANT) 60 MG capsule Take 60 mg by mouth daily.       No current facility-administered medications for this visit.    ROS:   General:  No weight loss, Fever, chills  HEENT: No recent headaches, no nasal bleeding, no visual changes, no sore throat  Neurologic: No dizziness, blackouts, seizures. No recent symptoms of stroke or mini- stroke. No recent episodes of slurred speech, or temporary blindness.  Cardiac: No recent episodes of chest pain/pressure, no shortness of breath at rest.  + shortness of breath with exertion.  Denies history of atrial fibrillation or irregular heartbeat  Vascular: + history of rest pain in feet.  + history of claudication.  No history of non-healing ulcer, No history of DVT   Pulmonary: No home oxygen, no productive cough, no hemoptysis,  No asthma or wheezing  Musculoskeletal:  [x ]  Arthritis, [x ] Low back pain,  [ ]  Joint pain  Hematologic:No history of hypercoagulable state.  No history of easy bleeding.  No history of anemia  Gastrointestinal: No hematochezia or melena,  No gastroesophageal reflux, no trouble swallowing  Urinary: [x ] chronic Kidney disease, [ ]   on HD - [ ]  MWF or [ ]  TTHS, [ ]  Burning with urination, [ ]  Frequent urination, [ ]  Difficulty urinating;   Skin: No rashes  Psychological: No history of anxiety,  No history of depression   Physical Examination  Filed Vitals:   03/28/13 1050  BP: 167/70  Pulse: 67  Resp: 16  Height: 5\' 2"  (1.575 m)  Weight: 165 lb (74.844 kg)  SpO2: 97%    Body mass index is 30.17 kg/(m^2).  General:  Alert and oriented, no acute distress HEENT: Tracheostomy Neck: Bilateral carotid bruit Pulmonary: Clear to auscultation bilaterally Cardiac: Regular Rate and Rhythm without murmur Abdomen: Soft, non-tender, non-distended, no mass Skin: No rash Extremity Pulses:  2+ radial, brachial, decrease femoral, absent dorsalis pedis, posterior tibial pulses bilaterally Musculoskeletal: No deformity or edema  Neurologic: Upper and lower extremity motor 5/5 and symmetric  DATA: ABIs from 02/23/2012 reviewed 0.27 on the left 0.96 on the right.  Carotid duplex in the time showed 40-60% left internal carotid artery stenosis less than 40% right internal carotid artery stenosis.  70% right internal carotid artery stenosis by CT Angio in April 2013   ASSESSMENT: Rest pain left foot with severely decreased ABI. Asymptomatic moderate to severe carotid stenosis asymptomatic   PLAN:  The patient will be scheduled for aortogram bilateral lower extremity runoff 04/05/2013. Risks benefits possible complications and procedure details were gone over in great length with the patient and her daughter today. They agree to proceed. She will also need reevaluation of her carotid disease but stents this is asymptomatic I believe we can put  this behind her left foot for now.  Fabienne Bruns, MD [facility] [place] Office: [phone] Pager: [phone]

## 2013-04-05 NOTE — Op Note (Signed)
Procedure: Aortogram with bilateral lower extremity runoff, right common iliac stent (7 x 40)  Preoperative diagnosis: Rest pain left foot  Postoperative diagnosis: Same  Anesthesia: Local with IV sedation  Operative findings: #1 bilateral patent proximal common iliac stents                                 #2 patent single left and right renal arteries                                 #3 50% stenosis right external iliac artery just above the inguinal ligament                                 #4 right SFA occlusion                                 #5 patent right femoral to above-knee popliteal bypass with prosthetic 80% distal anastomotic                                                   Stenosis                                #6 patent above-knee popliteal to posterior tibial bypass right side vein                                 #7 one-vessel runoff via the posterior tibial artery bilaterally                    #8 new right common iliac stent 7 x 40  For 70-80% right common iliac stenosis  Operative details: After obtaining informed consent, the patient was taken to the PV lab. The patient was placed in supine position the Angio table. Both groins were prepped and draped in the usual sterile fashion. Local anesthesia was infiltrated over the right common femoral artery. Ultrasound was used to identify the right common femoral artery. An introducer needle was used to cannulate the right common femoral artery. There was dense scar in the right groin. An 51 Versacore wire was threaded into the right common femoral artery but rather than going up into the iliac artery this proceeded on the path of a sidebranch. In order to redirect the wire initially a 5 [place] dilator was placed into the right common femoral artery. First an 035 Rosen wire was used to redirect up into the iliac system but this would not advance. At this point an 035 angled Glidewire was brought in the operative field and this easily  advanced up in the right iliac system and into the abdominal aorta. A 5 French pigtail catheter was placed over the guidewire into the abdominal aorta and abdominal aortogram was obtained.  The infrarenal abdominal aorta is irregular with no focal stenosis there single renal arteries left and right are both patent. There are bilateral proximal common iliac artery stents which are patent. There is  a high-grade 80% stenosis of the mid right common iliac artery. The internal iliac arteries are occluded bilaterally.  In the right lower extremity, there is a right femoral to above-knee popliteal bypass which is very tortuous. The right common femoral artery is patent. The right profunda femoris artery is small but patent. The right native superficial femoral artery is occluded. This appears to be prosthetic. The distal anastomosis has a 50% stenosis. There is a vein bypass which originates just below this extending from the above-knee popliteal artery to the mid right posterior tibial artery. This is patent. The distal anastomosis does not appear to have any narrowing but the posterior tibial vessel target is quite small. There is one-vessel runoff via the posterior tibial artery to the foot.  In the left lower extremity the left external iliac artery is irregular but with no focal stenosis. The left profunda femoris artery is patent but small. The left superficial femoral artery is occluded at its origin. The above-knee popliteal is occluded. The below-knee popliteal is patent but very diseased and irregular. The peroneal and anterior tibial arteries are occluded. There is one-vessel runoff via a left posterior tibial artery.  At this point it was decided to intervene on the right common iliac artery stenosis to improve inflow to the right femoropopliteal bypass graft. The patient was given 5000 units of intravenous heparin. The pigtail catheter was removed over a Rosen wire. The 5 French sheath was exchanged  over the Clyde wire for a 7 French bright tip sheath. A 5 French KMP catheter was then placed over the Rosen wire to exchanged the wire for a long Versacore wire. A 7 x 40 self-expanding stent was then brought in operative field and positioned using angiographic guidance. This was then deployed and then post dilated to with a 6 mm balloon. Completion arteriogram showed a widely patent right common iliac artery. Next the guidewire was removed. The 7 French sheath was left in place to be pulled in the holding area. ACT was confirmed the greater than 200 prior to the intervention.  The patient tolerated the procedure well and there were no complications. The patient was taken to the holding area in stable condition.  Operative management: The patient will be scheduled for a left femoral to posterior tibial bypass in the near future. She will need cardiac risk stratification by Dr. Donato Schultz prior to this. She will also need left lower extremity vein mapping. The plan and all findings were discussed with the patient and her daughter Elonda Husky.  Fabienne Bruns, MD [facility] [place] Office: [phone] Pager: [phone]

## 2013-04-05 NOTE — Interval H&P Note (Signed)
History and Physical Interval Note:  04/05/2013 7:36 AM  [name]  has presented today for surgery, with the diagnosis of PVD  The various methods of treatment have been discussed with the patient and family. After consideration of risks, benefits and other options for treatment, the patient has consented to  Procedure(s): ABDOMINAL AORTAGRAM (N/A) as a surgical intervention .  The patient's history has been reviewed, patient examined, no change in status, stable for surgery.  I have reviewed the patient's chart and labs.  Questions were answered to the patient's satisfaction.     [name]

## 2013-04-08 ENCOUNTER — Encounter (INDEPENDENT_AMBULATORY_CARE_PROVIDER_SITE_OTHER): Payer: Medicare Other | Admitting: *Deleted

## 2013-04-08 ENCOUNTER — Other Ambulatory Visit: Payer: Self-pay | Admitting: *Deleted

## 2013-04-08 DIAGNOSIS — I739 Peripheral vascular disease, unspecified: Secondary | ICD-10-CM

## 2013-04-08 DIAGNOSIS — Z01810 Encounter for preprocedural cardiovascular examination: Secondary | ICD-10-CM

## 2013-05-23 ENCOUNTER — Other Ambulatory Visit: Payer: Self-pay

## 2013-05-28 ENCOUNTER — Encounter (HOSPITAL_COMMUNITY): Payer: Self-pay | Admitting: Pharmacy Technician

## 2013-05-31 NOTE — Pre-Procedure Instructions (Addendum)
Loredana Medellin  05/31/2013   Your procedure is scheduled on: Tuesday, August 19th.   Report to [facility] at 5:30 AM.   Call this number if you have problems the morning of surgery: [phone]   Remember:   Do not eat food or drink liquids after midnight.    Take these medicines the morning of surgery with A SIP OF WATER: carvedilol (COREG). Use inhaler.  May take if needed:traMADol Janean Sark), nitroGLYCERIN (NITROSTAT).    Do not wear jewelry, make-up or nail polish.  Do not wear lotions, powders, or perfumes.   Do not shave 48 hours prior to surgery.   Do not bring valuables to the hospital.  [facility] is not responsible for any belongings or valuables.  Contacts, dentures or bridgework may not be worn into surgery.  Leave suitcase in the car. After surgery it may be brought to your room.   For patients admitted to the hospital, checkout time is 11:00 AM the day of discharge.   Patients discharged the day of surgery will not be allowed to drive home.      Special Instructions: Shower using CHG 2 nights before surgery and the night before surgery.  If you shower the day of surgery use CHG.  Use special wash - you have one bottle of CHG for all showers.  You should use approximately 1/3 of the bottle for each shower. N/A   Please read over the following fact sheets that you were given: Pain Booklet, Coughing and Deep Breathing, Blood Transfusion Information and Surgical Site Infection Prevention

## 2013-06-03 ENCOUNTER — Encounter (HOSPITAL_COMMUNITY)
Admission: RE | Admit: 2013-06-03 | Discharge: 2013-06-03 | Disposition: A | Discharge status: Home / Self Care | Payer: Medicare Other | Source: Ambulatory Visit | Attending: Vascular Surgery | Admitting: Vascular Surgery

## 2013-06-03 ENCOUNTER — Encounter (HOSPITAL_COMMUNITY): Payer: Self-pay

## 2013-06-03 DIAGNOSIS — Z01812 Encounter for preprocedural laboratory examination: Secondary | ICD-10-CM | POA: Insufficient documentation

## 2013-06-03 DIAGNOSIS — Z01818 Encounter for other preprocedural examination: Secondary | ICD-10-CM | POA: Insufficient documentation

## 2013-06-03 LAB — URINALYSIS, ROUTINE W REFLEX MICROSCOPIC
Bilirubin Urine: NEGATIVE
Hgb urine dipstick: NEGATIVE
Ketones, ur: NEGATIVE mg/dL
Nitrite: NEGATIVE
pH: 5.5 (ref 5.0–8.0)

## 2013-06-03 LAB — COMPREHENSIVE METABOLIC PANEL
ALT: 26 U/L (ref 0–35)
AST: 19 U/L (ref 0–37)
Albumin: 3.7 g/dL (ref 3.5–5.2)
CO2: 26 mEq/L (ref 19–32)
Calcium: 9.5 mg/dL (ref 8.4–10.5)
Sodium: 137 mEq/L (ref 135–145)
Total Protein: 7.5 g/dL (ref 6.0–8.3)

## 2013-06-03 LAB — SURGICAL PCR SCREEN
MRSA, PCR: NEGATIVE
Staphylococcus aureus: NEGATIVE

## 2013-06-03 LAB — CBC
Hemoglobin: 11.3 g/dL — ABNORMAL LOW (ref 12.0–15.0)
Platelets: 188 10*3/uL (ref 150–400)
RBC: 3.78 MIL/uL — ABNORMAL LOW (ref 3.87–5.11)
WBC: 6.4 10*3/uL (ref 4.0–10.5)

## 2013-06-03 LAB — URINE MICROSCOPIC-ADD ON

## 2013-06-03 LAB — APTT: aPTT: 31 seconds (ref 24–37)

## 2013-06-03 NOTE — Progress Notes (Addendum)
Anesthesia PAT Evaluation:  Patient is a 67 year old female scheduled for redo left femoral-posterior tibial artery bypass on 06/11/13 by Dr. Darrick Penna.    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  Other history includes COPD, chronic systolic CHF, CAD/MI s/p CABG/stent > 20 years ago, thyroidectomy for goiter with secondary hypothyroidism, PAD s/p left FPBG > 10 years ago, left carotid endarterectomy '08, TIA/CVA 01/2011 without residual effects, cholecystectomy, former smoker, CKD with hospitalization for acute renal failure in 11/2011 and 12/2012. PCP is Dr. Laurann Montana.  ENT is Dr. Christia Reading. She reports that she uses a 6 cuffless trach and is able to tolerate wearing a Passy-Muir valve during the daytime and is intermittently using an inner cannula.  She does wear 3 1/2 L/O2 at night.  She walks for 30 minutes typically at least 3X/week.  Prior to requiring a tracheostomy, she was evaluated by pulmonologist Dr. Sherene Sires, but is no longer being seen by Pulmonology.  She lived in [place] until about 2 years ago.  Cardiologist is Dr. Donato Schultz, who she reports cleared her for surgery.  Records are pending.  She denies chest pain, weight changes, LE edema, SOB at rest or progressive dyspnea.  Heart RRR with occasional ectopic beat.  Lungs clear.  Trach dressing CDI.  No LE edema noted.    CXR report from 12/31/12 showed: Grossly unchanged enlarged cardiac silhouette and mediastinal contours post median sternotomy. Post coronary artery stenting. Stable positioning of support apparatus. Lung volumes remain reduced with grossly unchanged linear heterogeneous opacities within the left mid lung favored to represent atelectasis. No new focal airspace opacities. No definite evidence of pulmonary  edema or pneumothorax. Unchanged bones. Post cholecystectomy. No acute cardiopulmonary disease noted.  Preoperative labs noted.  BUN/Cr 52/1.40.  Glucose 274.  (She report that her fasting glucose is < 200.  She had not taken her insulin yet this morning.)  I'll review cardiology records once available.    [name] [facility]Anesthesiology Phone [phone] 06/03/2013 5:38 PM  Addendum: 06/04/2013 12:55 PM I received [facility] cardiology notes and cardiac clearance.  She was last seen on 04/19/13 and felt to be of moderate increased risk for vascular surgery.  He ordered a nuclear stress test that was read on 04/25/13 and showed ischemia in the apical/distal anteroseptal distribution, EF 23%.  Her previous CABG involved a SVG to the LAD and SVG to the RCA in 1994, and she had a ramus stent in 2009.  Clinically she appeared well compensated from a systolic dysfunction/systolic heart failure standpoint, and since her infarct pattern only demonstrated mild peri-infarct ischemia he felt revascularization to this territory would not likely provide an incremental decrease in mortality.  Dr. Anne Fu  spoke with patient about her increased risk for CV complications/MI, and patient was willing to proceed with surgery.    Echo on 11/16/12 showed:  - Left ventricle: The cavity size was mildly dilated. Systolic function was severely reduced. The estimated ejection fraction was in the range of 25% to 30%. There is akinesis of the basal-mid inferior myocardium with otherwise generalized severe hypokinesis. Features are consistent with a pseudonormal left ventricular filling pattern, with concomitant abnormal relaxation and increased filling pressure (grade 2 diastolic dysfunction). - Mitral valve: Calcified annulus. Mild regurgitation. - Left  atrium: The atrium was moderately dilated. - Right ventricle: The cavity size was mildly dilated. Wall thickness was normal. Systolic function was mildly  reduced. - Right atrium: The atrium was mildly dilated. - Tricuspid valve: Mild regurgitation. - Pulmonary arteries: Systolic pressure was moderately increased. PA peak pressure: 48mm Hg (S). Impressions: No cardiac source of emboli was indentified. Compared to the prior study, there has been no significant interval change; she continues to have severely decreased EF.  (Dr. Anne Fu is treating her LV dysfunction medically for now, but did discuss possible need for ICD in the future.  There was some concern about increased risk of infection due to her tracheostomy. She is on b-blocker, ARB therapy, and diuretic therapies.)  Patient has CAD with ischemic cardiomyopathy but was cleared for this procedure with moderate risk by her cardiologist.  She did not exhibit any acute CHF symptoms and denied angina at her PAT visit.  Dr. Anne Fu has told her she may ultimately need an ICD, but no formal decision has been made yet.  Could consider placing defibrillator pads intraoperatively due to severe LV dysfunction.  She also has a trach for inflammatory subglottic stenosis and has been doing well from that standpoint.  Janina Mayo is #6 cuffless.  We did discuss use of trach for intraoperative airway access.  She will be further evaluated by her assigned anesthesiologist on the day of surgery to discuss the definitive anesthesia plan.

## 2013-06-03 NOTE — Progress Notes (Signed)
Requested Cardiac Clearance,last OV and any cardiac testing from Dr. Anne Fu office.

## 2013-06-10 MED ORDER — DEXTROSE 5 % IV SOLN
1.5000 g | INTRAVENOUS | Status: AC
  Administered 2013-06-11: 1.5 g via INTRAVENOUS
  Filled 2013-06-10: qty 1.5

## 2013-06-10 MED ORDER — SODIUM CHLORIDE 0.9 % IV SOLN: INTRAVENOUS | Status: DC

## 2013-06-11 ENCOUNTER — Encounter (HOSPITAL_COMMUNITY): Payer: Self-pay

## 2013-06-11 ENCOUNTER — Inpatient Hospital Stay (HOSPITAL_COMMUNITY)
Admission: RE | Admit: 2013-06-11 | Discharge: 2013-06-16 | DRG: 253 | Disposition: A | Discharge status: Home Health | Payer: Medicare Other | Source: Ambulatory Visit | Attending: Vascular Surgery | Admitting: Vascular Surgery

## 2013-06-11 ENCOUNTER — Inpatient Hospital Stay (HOSPITAL_COMMUNITY): Payer: Medicare Other | Admitting: Anesthesiology

## 2013-06-11 ENCOUNTER — Encounter (HOSPITAL_COMMUNITY): Payer: Self-pay | Admitting: Vascular Surgery

## 2013-06-11 ENCOUNTER — Encounter (HOSPITAL_COMMUNITY)
Admission: RE | Disposition: A | Discharge status: Home Health | Payer: Self-pay | Source: Ambulatory Visit | Attending: Vascular Surgery

## 2013-06-11 DIAGNOSIS — Z87891 Personal history of nicotine dependence: Secondary | ICD-10-CM

## 2013-06-11 DIAGNOSIS — Z8673 Personal history of transient ischemic attack (TIA), and cerebral infarction without residual deficits: Secondary | ICD-10-CM

## 2013-06-11 DIAGNOSIS — Z951 Presence of aortocoronary bypass graft: Secondary | ICD-10-CM

## 2013-06-11 DIAGNOSIS — Z7982 Long term (current) use of aspirin: Secondary | ICD-10-CM

## 2013-06-11 DIAGNOSIS — I658 Occlusion and stenosis of other precerebral arteries: Secondary | ICD-10-CM | POA: Diagnosis present

## 2013-06-11 DIAGNOSIS — Z794 Long term (current) use of insulin: Secondary | ICD-10-CM

## 2013-06-11 DIAGNOSIS — T82898A Other specified complication of vascular prosthetic devices, implants and grafts, initial encounter: Secondary | ICD-10-CM

## 2013-06-11 DIAGNOSIS — M51379 Other intervertebral disc degeneration, lumbosacral region without mention of lumbar back pain or lower extremity pain: Secondary | ICD-10-CM | POA: Diagnosis present

## 2013-06-11 DIAGNOSIS — M5137 Other intervertebral disc degeneration, lumbosacral region: Secondary | ICD-10-CM | POA: Diagnosis present

## 2013-06-11 DIAGNOSIS — I739 Peripheral vascular disease, unspecified: Secondary | ICD-10-CM

## 2013-06-11 DIAGNOSIS — Z79899 Other long term (current) drug therapy: Secondary | ICD-10-CM

## 2013-06-11 DIAGNOSIS — J449 Chronic obstructive pulmonary disease, unspecified: Secondary | ICD-10-CM | POA: Diagnosis present

## 2013-06-11 DIAGNOSIS — I509 Heart failure, unspecified: Secondary | ICD-10-CM | POA: Diagnosis present

## 2013-06-11 DIAGNOSIS — E1142 Type 2 diabetes mellitus with diabetic polyneuropathy: Secondary | ICD-10-CM | POA: Diagnosis present

## 2013-06-11 DIAGNOSIS — E785 Hyperlipidemia, unspecified: Secondary | ICD-10-CM | POA: Diagnosis present

## 2013-06-11 DIAGNOSIS — E039 Hypothyroidism, unspecified: Secondary | ICD-10-CM | POA: Diagnosis present

## 2013-06-11 DIAGNOSIS — E1149 Type 2 diabetes mellitus with other diabetic neurological complication: Secondary | ICD-10-CM | POA: Diagnosis present

## 2013-06-11 DIAGNOSIS — K219 Gastro-esophageal reflux disease without esophagitis: Secondary | ICD-10-CM | POA: Diagnosis present

## 2013-06-11 DIAGNOSIS — I743 Embolism and thrombosis of arteries of the lower extremities: Secondary | ICD-10-CM | POA: Diagnosis present

## 2013-06-11 DIAGNOSIS — R200 Anesthesia of skin: Secondary | ICD-10-CM

## 2013-06-11 DIAGNOSIS — K5900 Constipation, unspecified: Secondary | ICD-10-CM | POA: Diagnosis not present

## 2013-06-11 DIAGNOSIS — M79609 Pain in unspecified limb: Secondary | ICD-10-CM

## 2013-06-11 DIAGNOSIS — I70229 Atherosclerosis of native arteries of extremities with rest pain, unspecified extremity: Principal | ICD-10-CM | POA: Diagnosis present

## 2013-06-11 DIAGNOSIS — Z930 Tracheostomy status: Secondary | ICD-10-CM

## 2013-06-11 DIAGNOSIS — R339 Retention of urine, unspecified: Secondary | ICD-10-CM | POA: Diagnosis not present

## 2013-06-11 DIAGNOSIS — J4489 Other specified chronic obstructive pulmonary disease: Secondary | ICD-10-CM | POA: Diagnosis present

## 2013-06-11 DIAGNOSIS — Y832 Surgical operation with anastomosis, bypass or graft as the cause of abnormal reaction of the patient, or of later complication, without mention of misadventure at the time of the procedure: Secondary | ICD-10-CM | POA: Diagnosis present

## 2013-06-11 DIAGNOSIS — N183 Chronic kidney disease, stage 3 unspecified: Secondary | ICD-10-CM | POA: Diagnosis present

## 2013-06-11 DIAGNOSIS — D62 Acute posthemorrhagic anemia: Secondary | ICD-10-CM | POA: Diagnosis not present

## 2013-06-11 DIAGNOSIS — I129 Hypertensive chronic kidney disease with stage 1 through stage 4 chronic kidney disease, or unspecified chronic kidney disease: Secondary | ICD-10-CM | POA: Diagnosis present

## 2013-06-11 DIAGNOSIS — Z9861 Coronary angioplasty status: Secondary | ICD-10-CM

## 2013-06-11 DIAGNOSIS — Z7902 Long term (current) use of antithrombotics/antiplatelets: Secondary | ICD-10-CM

## 2013-06-11 DIAGNOSIS — G629 Polyneuropathy, unspecified: Secondary | ICD-10-CM

## 2013-06-11 DIAGNOSIS — I2510 Atherosclerotic heart disease of native coronary artery without angina pectoris: Secondary | ICD-10-CM | POA: Diagnosis present

## 2013-06-11 DIAGNOSIS — I6529 Occlusion and stenosis of unspecified carotid artery: Secondary | ICD-10-CM | POA: Diagnosis present

## 2013-06-11 DIAGNOSIS — I252 Old myocardial infarction: Secondary | ICD-10-CM

## 2013-06-11 HISTORY — PX: FEMORAL-TIBIAL BYPASS GRAFT: SHX938

## 2013-06-11 HISTORY — PX: THROMBECTOMY FEMORAL ARTERY: SHX6406

## 2013-06-11 LAB — POCT I-STAT 4, (NA,K, GLUC, HGB,HCT)
Glucose, Bld: 216 mg/dL — ABNORMAL HIGH (ref 70–99)
HCT: 26 % — ABNORMAL LOW (ref 36.0–46.0)
Hemoglobin: 7.8 g/dL — ABNORMAL LOW (ref 12.0–15.0)
Potassium: 4.6 mEq/L (ref 3.5–5.1)
Potassium: 4.7 mEq/L (ref 3.5–5.1)

## 2013-06-11 LAB — GLUCOSE, CAPILLARY
Glucose-Capillary: 184 mg/dL — ABNORMAL HIGH (ref 70–99)
Glucose-Capillary: 183 mg/dL — ABNORMAL HIGH (ref 70–99)
Glucose-Capillary: 176 mg/dL — ABNORMAL HIGH (ref 70–99)

## 2013-06-11 LAB — PREPARE RBC (CROSSMATCH)

## 2013-06-11 SURGERY — CREATION, BYPASS, ARTERIAL, FEMORAL TO TIBIAL, USING GRAFT
Anesthesia: General | Site: Leg Lower | Laterality: Left | Wound class: Clean

## 2013-06-11 MED ORDER — 0.9 % SODIUM CHLORIDE (POUR BTL) OPTIME
TOPICAL | Status: DC | PRN
  Administered 2013-06-11: 3000 mL

## 2013-06-11 MED ORDER — DEXTROSE 5 % IV SOLN
1.5000 g | Freq: Two times a day (BID) | INTRAVENOUS | Status: AC
  Administered 2013-06-11 – 2013-06-12 (×2): 1.5 g via INTRAVENOUS
  Filled 2013-06-11 (×2): qty 1.5

## 2013-06-11 MED ORDER — SIMVASTATIN 40 MG PO TABS
40.0000 mg | ORAL_TABLET | Freq: Every evening | ORAL | Status: DC
  Administered 2013-06-11 – 2013-06-16 (×6): 40 mg via ORAL
  Filled 2013-06-11 (×7): qty 1

## 2013-06-11 MED ORDER — LABETALOL HCL 5 MG/ML IV SOLN
10.0000 mg | INTRAVENOUS | Status: DC | PRN
  Filled 2013-06-11: qty 4

## 2013-06-11 MED ORDER — PROPOFOL 10 MG/ML IV BOLUS
INTRAVENOUS | Status: DC | PRN
  Administered 2013-06-11: 150 mg via INTRAVENOUS
  Administered 2013-06-11: 20 mg via INTRAVENOUS

## 2013-06-11 MED ORDER — OXYCODONE HCL 5 MG PO TABS: 5.0000 mg | ORAL_TABLET | Freq: Once | ORAL | Status: DC | PRN

## 2013-06-11 MED ORDER — PHENYLEPHRINE HCL 10 MG/ML IJ SOLN
INTRAMUSCULAR | Status: DC | PRN
  Administered 2013-06-11 (×2): 40 ug via INTRAVENOUS

## 2013-06-11 MED ORDER — PHENYLEPHRINE HCL 10 MG/ML IJ SOLN
10.0000 mg | INTRAVENOUS | Status: DC | PRN
  Administered 2013-06-11: 25 ug/min via INTRAVENOUS

## 2013-06-11 MED ORDER — PHENOL 1.4 % MT LIQD: 1.0000 | OROMUCOSAL | Status: DC | PRN

## 2013-06-11 MED ORDER — HEPARIN SODIUM (PORCINE) 1000 UNIT/ML IJ SOLN
INTRAMUSCULAR | Status: DC | PRN
  Administered 2013-06-11: 7000 [IU] via INTRAVENOUS

## 2013-06-11 MED ORDER — NITROGLYCERIN 0.4 MG SL SUBL: 0.4000 mg | SUBLINGUAL_TABLET | SUBLINGUAL | Status: DC | PRN

## 2013-06-11 MED ORDER — FUROSEMIDE 20 MG PO TABS
20.0000 mg | ORAL_TABLET | Freq: Two times a day (BID) | ORAL | Status: DC
  Administered 2013-06-11 – 2013-06-16 (×11): 20 mg via ORAL
  Filled 2013-06-11 (×14): qty 1

## 2013-06-11 MED ORDER — SODIUM CHLORIDE 0.9 % IV SOLN: 500.0000 mL | Freq: Once | INTRAVENOUS | Status: AC | PRN

## 2013-06-11 MED ORDER — GLYCOPYRROLATE 0.2 MG/ML IJ SOLN
INTRAMUSCULAR | Status: DC | PRN
  Administered 2013-06-11: 0.2 mg via INTRAVENOUS

## 2013-06-11 MED ORDER — ARTIFICIAL TEARS OP OINT
TOPICAL_OINTMENT | OPHTHALMIC | Status: DC | PRN
  Administered 2013-06-11: 1 via OPHTHALMIC

## 2013-06-11 MED ORDER — ONDANSETRON HCL 4 MG/2ML IJ SOLN
4.0000 mg | Freq: Four times a day (QID) | INTRAMUSCULAR | Status: DC | PRN
  Administered 2013-06-11: 4 mg via INTRAVENOUS
  Filled 2013-06-11: qty 2

## 2013-06-11 MED ORDER — POTASSIUM CHLORIDE CRYS ER 20 MEQ PO TBCR: 20.0000 meq | EXTENDED_RELEASE_TABLET | Freq: Every day | ORAL | Status: DC | PRN

## 2013-06-11 MED ORDER — HYDROMORPHONE HCL PF 1 MG/ML IJ SOLN
INTRAMUSCULAR | Status: AC
  Filled 2013-06-11: qty 1

## 2013-06-11 MED ORDER — HYDROMORPHONE HCL PF 1 MG/ML IJ SOLN
0.2500 mg | INTRAMUSCULAR | Status: DC | PRN
  Administered 2013-06-11: 0.25 mg via INTRAVENOUS
  Administered 2013-06-11: 0.5 mg via INTRAVENOUS
  Administered 2013-06-11: 0.25 mg via INTRAVENOUS

## 2013-06-11 MED ORDER — SODIUM CHLORIDE 0.9 % IR SOLN
Status: DC | PRN
  Administered 2013-06-11: 09:00:00

## 2013-06-11 MED ORDER — ALUM & MAG HYDROXIDE-SIMETH 200-200-20 MG/5ML PO SUSP: 15.0000 mL | ORAL | Status: DC | PRN

## 2013-06-11 MED ORDER — ALBUMIN HUMAN 5 % IV SOLN
INTRAVENOUS | Status: DC | PRN
  Administered 2013-06-11 (×2): via INTRAVENOUS

## 2013-06-11 MED ORDER — VECURONIUM BROMIDE 10 MG IV SOLR
INTRAVENOUS | Status: DC | PRN
  Administered 2013-06-11 (×2): 2 mg via INTRAVENOUS
  Administered 2013-06-11: 4 mg via INTRAVENOUS

## 2013-06-11 MED ORDER — CARVEDILOL 25 MG PO TABS
25.0000 mg | ORAL_TABLET | Freq: Two times a day (BID) | ORAL | Status: DC
  Administered 2013-06-11 – 2013-06-16 (×11): 25 mg via ORAL
  Filled 2013-06-11 (×13): qty 1

## 2013-06-11 MED ORDER — ALBUTEROL SULFATE HFA 108 (90 BASE) MCG/ACT IN AERS
INHALATION_SPRAY | RESPIRATORY_TRACT | Status: DC | PRN
  Administered 2013-06-11: 2 via RESPIRATORY_TRACT

## 2013-06-11 MED ORDER — IPRATROPIUM-ALBUTEROL 0.5-2.5 (3) MG/3ML IN SOLN: 3.0000 mL | Freq: Three times a day (TID) | RESPIRATORY_TRACT | Status: DC

## 2013-06-11 MED ORDER — PANTOPRAZOLE SODIUM 40 MG PO TBEC
40.0000 mg | DELAYED_RELEASE_TABLET | Freq: Every day | ORAL | Status: DC
  Administered 2013-06-12 – 2013-06-16 (×4): 40 mg via ORAL
  Filled 2013-06-11 (×4): qty 1

## 2013-06-11 MED ORDER — LIDOCAINE HCL 4 % MT SOLN
OROMUCOSAL | Status: DC | PRN
  Administered 2013-06-11: 4 mL via TOPICAL

## 2013-06-11 MED ORDER — LACTATED RINGERS IV SOLN
INTRAVENOUS | Status: DC | PRN
  Administered 2013-06-11 (×2): via INTRAVENOUS

## 2013-06-11 MED ORDER — OXYCODONE HCL 5 MG/5ML PO SOLN: 5.0000 mg | Freq: Once | ORAL | Status: DC | PRN

## 2013-06-11 MED ORDER — INSULIN GLARGINE 100 UNIT/ML ~~LOC~~ SOLN
22.0000 [IU] | Freq: Every day | SUBCUTANEOUS | Status: DC
  Administered 2013-06-12 – 2013-06-15 (×4): 22 [IU] via SUBCUTANEOUS
  Filled 2013-06-11 (×5): qty 0.22

## 2013-06-11 MED ORDER — ACETAMINOPHEN 650 MG RE SUPP: 325.0000 mg | RECTAL | Status: DC | PRN

## 2013-06-11 MED ORDER — SODIUM CHLORIDE 0.9 % IV SOLN
INTRAVENOUS | Status: DC
  Administered 2013-06-11: 100 mL/h via INTRAVENOUS

## 2013-06-11 MED ORDER — NEOSTIGMINE METHYLSULFATE 1 MG/ML IJ SOLN
INTRAMUSCULAR | Status: DC | PRN
  Administered 2013-06-11: 2 mg via INTRAVENOUS

## 2013-06-11 MED ORDER — HYDRALAZINE HCL 20 MG/ML IJ SOLN
5.0000 mg | Freq: Once | INTRAMUSCULAR | Status: AC
  Administered 2013-06-11: 5 mg via INTRAVENOUS

## 2013-06-11 MED ORDER — METOPROLOL TARTRATE 1 MG/ML IV SOLN: 2.0000 mg | INTRAVENOUS | Status: DC | PRN

## 2013-06-11 MED ORDER — GUAIFENESIN-DM 100-10 MG/5ML PO SYRP: 15.0000 mL | ORAL_SOLUTION | ORAL | Status: DC | PRN

## 2013-06-11 MED ORDER — THROMBIN 20000 UNITS EX SOLR
CUTANEOUS | Status: DC | PRN
  Administered 2013-06-11: 12:00:00 via TOPICAL

## 2013-06-11 MED ORDER — THROMBIN 20000 UNITS EX SOLR
CUTANEOUS | Status: AC
  Filled 2013-06-11: qty 20000

## 2013-06-11 MED ORDER — IRBESARTAN 300 MG PO TABS
300.0000 mg | ORAL_TABLET | Freq: Every day | ORAL | Status: DC
  Administered 2013-06-11 – 2013-06-15 (×5): 300 mg via ORAL
  Filled 2013-06-11 (×6): qty 1

## 2013-06-11 MED ORDER — INSULIN ASPART 100 UNIT/ML ~~LOC~~ SOLN
0.0000 [IU] | Freq: Three times a day (TID) | SUBCUTANEOUS | Status: DC
  Administered 2013-06-12 (×3): 3 [IU] via SUBCUTANEOUS
  Administered 2013-06-13: 5 [IU] via SUBCUTANEOUS
  Administered 2013-06-13 (×2): 3 [IU] via SUBCUTANEOUS
  Administered 2013-06-14: 5 [IU] via SUBCUTANEOUS
  Administered 2013-06-14: 3 [IU] via SUBCUTANEOUS
  Administered 2013-06-15: 5 [IU] via SUBCUTANEOUS
  Administered 2013-06-15 – 2013-06-16 (×3): 3 [IU] via SUBCUTANEOUS
  Administered 2013-06-16 (×2): 5 [IU] via SUBCUTANEOUS

## 2013-06-11 MED ORDER — MORPHINE SULFATE 2 MG/ML IJ SOLN
2.0000 mg | INTRAMUSCULAR | Status: DC | PRN
  Administered 2013-06-11 (×2): 4 mg via INTRAVENOUS
  Administered 2013-06-12: 2 mg via INTRAVENOUS
  Administered 2013-06-12: 4 mg via INTRAVENOUS
  Administered 2013-06-13 – 2013-06-14 (×3): 2 mg via INTRAVENOUS
  Administered 2013-06-15 (×2): 4 mg via INTRAVENOUS
  Administered 2013-06-15: 2 mg via INTRAVENOUS
  Filled 2013-06-11 (×2): qty 2
  Filled 2013-06-11: qty 1
  Filled 2013-06-11: qty 2
  Filled 2013-06-11: qty 1
  Filled 2013-06-11: qty 2
  Filled 2013-06-11 (×3): qty 1
  Filled 2013-06-11: qty 2

## 2013-06-11 MED ORDER — HYDRALAZINE HCL 20 MG/ML IJ SOLN
INTRAMUSCULAR | Status: AC
  Administered 2013-06-11: 5 mg via INTRAVENOUS
  Filled 2013-06-11: qty 1

## 2013-06-11 MED ORDER — MEPERIDINE HCL 25 MG/ML IJ SOLN: 6.2500 mg | INTRAMUSCULAR | Status: DC | PRN

## 2013-06-11 MED ORDER — FENTANYL CITRATE 0.05 MG/ML IJ SOLN
INTRAMUSCULAR | Status: DC | PRN
  Administered 2013-06-11: 150 ug via INTRAVENOUS
  Administered 2013-06-11 (×7): 50 ug via INTRAVENOUS

## 2013-06-11 MED ORDER — ALBUTEROL SULFATE (5 MG/ML) 0.5% IN NEBU
2.5000 mg | INHALATION_SOLUTION | Freq: Three times a day (TID) | RESPIRATORY_TRACT | Status: DC
  Administered 2013-06-11 – 2013-06-13 (×7): 2.5 mg via RESPIRATORY_TRACT
  Filled 2013-06-11 (×7): qty 0.5

## 2013-06-11 MED ORDER — TRAMADOL HCL 50 MG PO TABS
50.0000 mg | ORAL_TABLET | Freq: Four times a day (QID) | ORAL | Status: DC | PRN
  Administered 2013-06-11 – 2013-06-16 (×11): 50 mg via ORAL
  Filled 2013-06-11 (×11): qty 1

## 2013-06-11 MED ORDER — ASPIRIN EC 81 MG PO TBEC
81.0000 mg | DELAYED_RELEASE_TABLET | Freq: Every day | ORAL | Status: DC
  Administered 2013-06-12 – 2013-06-16 (×5): 81 mg via ORAL
  Filled 2013-06-11 (×5): qty 1

## 2013-06-11 MED ORDER — ONDANSETRON HCL 4 MG/2ML IJ SOLN: 4.0000 mg | Freq: Once | INTRAMUSCULAR | Status: DC | PRN

## 2013-06-11 MED ORDER — HYDRALAZINE HCL 20 MG/ML IJ SOLN: 10.0000 mg | INTRAMUSCULAR | Status: DC | PRN

## 2013-06-11 MED ORDER — ACETAMINOPHEN 325 MG PO TABS
325.0000 mg | ORAL_TABLET | ORAL | Status: DC | PRN
  Administered 2013-06-12: 650 mg via ORAL
  Filled 2013-06-11: qty 2

## 2013-06-11 MED ORDER — LIDOCAINE HCL (CARDIAC) 20 MG/ML IV SOLN
INTRAVENOUS | Status: DC | PRN
  Administered 2013-06-11: 50 mg via INTRAVENOUS

## 2013-06-11 MED ORDER — IPRATROPIUM BROMIDE 0.02 % IN SOLN
0.5000 mg | Freq: Three times a day (TID) | RESPIRATORY_TRACT | Status: DC
  Administered 2013-06-11 – 2013-06-13 (×7): 0.5 mg via RESPIRATORY_TRACT
  Filled 2013-06-11 (×7): qty 2.5

## 2013-06-11 MED ORDER — MIDAZOLAM HCL 5 MG/5ML IJ SOLN
INTRAMUSCULAR | Status: DC | PRN
  Administered 2013-06-11: 2 mg via INTRAVENOUS

## 2013-06-11 MED ORDER — DOCUSATE SODIUM 100 MG PO CAPS
100.0000 mg | ORAL_CAPSULE | Freq: Every day | ORAL | Status: DC
  Administered 2013-06-12 – 2013-06-16 (×5): 100 mg via ORAL
  Filled 2013-06-11 (×5): qty 1

## 2013-06-11 SURGICAL SUPPLY — 73 items
BANDAGE ESMARK 6X9 LF (GAUZE/BANDAGES/DRESSINGS) IMPLANT
BNDG ESMARK 6X9 LF (GAUZE/BANDAGES/DRESSINGS)
CANISTER SUCTION 2500CC (MISCELLANEOUS) ×3 IMPLANT
CANNULA VESSEL W/WING WO/VALVE (CANNULA) ×3 IMPLANT
CATH EMB 4FR 80CM (CATHETERS) ×3 IMPLANT
CATH EMB 5FR 80CM (CATHETERS) ×6 IMPLANT
CLIP TI MEDIUM 24 (CLIP) ×3 IMPLANT
CLIP TI WIDE RED SMALL 24 (CLIP) ×6 IMPLANT
CLOTH BEACON ORANGE TIMEOUT ST (SAFETY) ×3 IMPLANT
COVER SURGICAL LIGHT HANDLE (MISCELLANEOUS) ×3 IMPLANT
CUFF TOURNIQUET SINGLE 24IN (TOURNIQUET CUFF) IMPLANT
CUFF TOURNIQUET SINGLE 34IN LL (TOURNIQUET CUFF) IMPLANT
CUFF TOURNIQUET SINGLE 44IN (TOURNIQUET CUFF) IMPLANT
DECANTER SPIKE VIAL GLASS SM (MISCELLANEOUS) IMPLANT
DERMABOND ADVANCED (GAUZE/BANDAGES/DRESSINGS) ×1
DERMABOND ADVANCED .7 DNX12 (GAUZE/BANDAGES/DRESSINGS) ×2 IMPLANT
DRAIN SNY WOU (WOUND CARE) IMPLANT
DRAPE WARM FLUID 44X44 (DRAPE) ×3 IMPLANT
DRAPE X-RAY CASS 24X20 (DRAPES) IMPLANT
DRSG COVADERM 4X14 (GAUZE/BANDAGES/DRESSINGS) ×6 IMPLANT
DRSG COVADERM 4X8 (GAUZE/BANDAGES/DRESSINGS) ×6 IMPLANT
ELECT REM PT RETURN 9FT ADLT (ELECTROSURGICAL) ×3
ELECTRODE REM PT RTRN 9FT ADLT (ELECTROSURGICAL) ×2 IMPLANT
EVACUATOR SILICONE 100CC (DRAIN) IMPLANT
GAUZE SPONGE 4X4 16PLY XRAY LF (GAUZE/BANDAGES/DRESSINGS) ×3 IMPLANT
GLOVE BIO SURGEON STRL SZ7.5 (GLOVE) ×9 IMPLANT
GLOVE BIOGEL PI IND STRL 6.5 (GLOVE) ×4 IMPLANT
GLOVE BIOGEL PI IND STRL 7.0 (GLOVE) ×4 IMPLANT
GLOVE BIOGEL PI IND STRL 7.5 (GLOVE) ×2 IMPLANT
GLOVE BIOGEL PI IND STRL 8 (GLOVE) ×4 IMPLANT
GLOVE BIOGEL PI INDICATOR 6.5 (GLOVE) ×2
GLOVE BIOGEL PI INDICATOR 7.0 (GLOVE) ×2
GLOVE BIOGEL PI INDICATOR 7.5 (GLOVE) ×1
GLOVE BIOGEL PI INDICATOR 8 (GLOVE) ×2
GLOVE ECLIPSE 6.0 STRL STRAW (GLOVE) ×9 IMPLANT
GLOVE SS BIOGEL STRL SZ 7 (GLOVE) ×2 IMPLANT
GLOVE SUPERSENSE BIOGEL SZ 7 (GLOVE) ×1
GOWN PREVENTION PLUS XLARGE (GOWN DISPOSABLE) ×3 IMPLANT
GOWN STRL NON-REIN LRG LVL3 (GOWN DISPOSABLE) ×12 IMPLANT
KIT BASIN OR (CUSTOM PROCEDURE TRAY) ×3 IMPLANT
KIT ROOM TURNOVER OR (KITS) ×3 IMPLANT
MARKER GRAFT CORONARY BYPASS (MISCELLANEOUS) IMPLANT
NS IRRIG 1000ML POUR BTL (IV SOLUTION) ×6 IMPLANT
PACK PERIPHERAL VASCULAR (CUSTOM PROCEDURE TRAY) ×3 IMPLANT
PAD ARMBOARD 7.5X6 YLW CONV (MISCELLANEOUS) ×6 IMPLANT
PADDING CAST COTTON 6X4 STRL (CAST SUPPLIES) IMPLANT
SET COLLECT BLD 21X3/4 12 (NEEDLE) IMPLANT
SLEEVE SURGEON STRL (DRAPES) ×3 IMPLANT
SPONGE GAUZE 4X4 12PLY (GAUZE/BANDAGES/DRESSINGS) ×6 IMPLANT
SPONGE SURGIFOAM ABS GEL 100 (HEMOSTASIS) ×3 IMPLANT
STAPLER VISISTAT 35W (STAPLE) ×3 IMPLANT
STOPCOCK 4 WAY LG BORE MALE ST (IV SETS) IMPLANT
SUT ETHILON 3 0 PS 1 (SUTURE) ×9 IMPLANT
SUT PROLENE 5 0 C 1 24 (SUTURE) ×24 IMPLANT
SUT PROLENE 6 0 CC (SUTURE) ×6 IMPLANT
SUT PROLENE 7 0 BV 1 (SUTURE) ×27 IMPLANT
SUT PROLENE 7 0 BV1 MDA (SUTURE) IMPLANT
SUT SILK 2 0 FS (SUTURE) ×3 IMPLANT
SUT SILK 2 0 SH (SUTURE) ×3 IMPLANT
SUT SILK 3 0 (SUTURE) ×2
SUT SILK 3-0 18XBRD TIE 12 (SUTURE) ×4 IMPLANT
SUT VIC AB 2-0 CTX 36 (SUTURE) ×6 IMPLANT
SUT VIC AB 3-0 SH 27 (SUTURE) ×4
SUT VIC AB 3-0 SH 27X BRD (SUTURE) ×8 IMPLANT
SUT VICRYL 4-0 PS2 18IN ABS (SUTURE) ×6 IMPLANT
SYR 3ML LL SCALE MARK (SYRINGE) ×6 IMPLANT
TAPE UMBILICAL COTTON 1/8X30 (MISCELLANEOUS) ×3 IMPLANT
TOWEL OR 17X24 6PK STRL BLUE (TOWEL DISPOSABLE) ×6 IMPLANT
TOWEL OR 17X26 10 PK STRL BLUE (TOWEL DISPOSABLE) ×6 IMPLANT
TRAY FOLEY CATH 16FRSI W/METER (SET/KITS/TRAYS/PACK) ×3 IMPLANT
TUBING EXTENTION W/L.L. (IV SETS) IMPLANT
UNDERPAD 30X30 INCONTINENT (UNDERPADS AND DIAPERS) ×3 IMPLANT
WATER STERILE IRR 1000ML POUR (IV SOLUTION) ×6 IMPLANT

## 2013-06-11 NOTE — Preoperative (Signed)
Beta Blockers   Reason not to administer Beta Blockers:Not Applicable 

## 2013-06-11 NOTE — H&P (Signed)
[facility]   HISTORY AND PHYSICAL  History of Present Illness: Patient is a 67 y.o. year old female who presents for evaluation of left calf and foot pain. This is worse over the last 2-3 months. The patient previously had a left leg bypass elsewhere several years ago. She was previously seen for evaluation of carotid stenosis a year ago and was scheduled for a carotid arteriogram but decided to cancel her procedure. At that time she was noted to have a decreased ABI in the left leg but was asymptomatic. The patient experiences pain in her left calf with ambulation at short distances. She has continuous pain in her left foot. She has no open ulcers. Other medical problems include diabetes, coronary disease (prior right leg vein harvest), peripheral arterial disease, prior stroke, COPD, history of renal insufficiency. These are all currently stable. She is on Plavix for antiplatelet therapy which was stopped 9 days ago. Past Medical History   Diagnosis  Date   .  Diabetes mellitus      on Lantus 30 U   .  CAD (coronary artery disease)      S/p CABG and stenting   .  PAD (peripheral artery disease)    .  Stroke      MRI 11/2011 with remote occipital lobe. MRA with moderate left focal vertebral artery stenosis   .  CHF (congestive heart failure)  11/2011     Echo with EF 30-35%, global hypokinesis, and inferior akinesis   .  Hyperlipidemia    .  DDD (degenerative disc disease), lumbar    .  CVA (cerebral vascular accident)  11/2010   .  MI (myocardial infarction)  1997   .  COPD (chronic obstructive pulmonary disease)    .  Anemia    .  Chronic kidney disease    .  Irregular heart beat    .  Hypertension    .  GERD (gastroesophageal reflux disease)    .  History of IBS    .  Hypothyroidism      Goiter   .  Thyroid disease    .  Carotid artery occlusion     Past Surgical History   Procedure  Laterality  Date   .  Ptca     .  Thyroidectomy     .  Coronary  artery bypass graft       2 vessel   .  Carotid endarterectomy   ~2008     Left   .  Cholecystectomy     .  Tracheostomy tube placement   01/02/2012   .  Angioplasty   [id number]     Aortogram by Dr. [place] McKenzie [facility])   .  Pr vein bypass graft,aorto-fem-pop       Right common femoral-AK popliteal BPG & Right Popliteal-posterior tibial   .  Pr vein bypass graft,aorto-fem-pop       Left Fem-pop BPG   Social History  History   Substance Use Topics   .  Smoking status:  Former Smoker -- 1.00 packs/day for 50 years     Types:  Cigarettes     Quit date:  01/24/2011   .  Smokeless tobacco:  Never Used   .  Alcohol Use:  No   Family History  Family History   Problem  Relation  Age of Onset   .  Hyperlipidemia  Mother    .  Other  Mother  AAA   .  Alzheimer's disease  Mother    .  Heart disease  Mother    .  Irregular heart beat  Mother    .  Diabetes  Daughter    .  Hypertension  Daughter    Allergies  Allergies   Allergen  Reactions   .  Aldactone (Spironolactone)      Severe hyperkalemia   .  Crestor (Rosuvastatin Calcium)  Other (See Comments)     Muscle Pain   .  Lisinopril  Other (See Comments)     "Lowers her BP too low" & cough   .  Vicodin (Hydrocodone-Acetaminophen)  Nausea And Vomiting    Current Outpatient Prescriptions   Medication  Sig  Dispense  Refill   .  acetaminophen (TYLENOL) 500 MG tablet  Take 500 mg by mouth every 6 (six) hours as needed. For pain     .  albuterol (PROVENTIL) (2.5 MG/3ML) 0.083% nebulizer solution  Take 2.5 mg by nebulization 4 (four) times daily.     Marland Kitchen  aspirin EC 81 MG tablet  Take 81 mg by mouth daily.     .  carvedilol (COREG) 25 MG tablet  Take 25 mg by mouth 2 (two) times daily with a meal.     .  clopidogrel (PLAVIX) 75 MG tablet  Take 75 mg by mouth daily.     .  furosemide (LASIX) 40 MG tablet  Take 1 tablet (40 mg total) by mouth daily. May take 1 additional dose for a weight gain of 2 lbs  30 tablet  0   .   insulin aspart (NOVOLOG) 100 UNIT/ML injection  Inject 2-10 Units into the skin 3 (three) times daily before meals. Sliding scale as directed     .  insulin glargine (LANTUS) 100 UNIT/ML injection  Inject 22 Units into the skin at bedtime.     .  irbesartan (AVAPRO) 300 MG tablet  Take 1 tablet (300 mg total) by mouth at bedtime.   0   .  loperamide (IMODIUM) 2 MG capsule  Take 4 mg by mouth 4 (four) times daily as needed. For diarrhea     .  Multiple Vitamin (MULTIVITAMIN WITH MINERALS) TABS  Take 1 tablet by mouth daily.     .  nitroGLYCERIN (NITROSTAT) 0.4 MG SL tablet  Place 0.4 mg under the tongue every 5 (five) minutes as needed. For chest pain     .  simvastatin (ZOCOR) 40 MG tablet  Take 40 mg by mouth every evening.     .  traMADol (ULTRAM) 50 MG tablet  Take 50 mg by mouth every 6 (six) hours as needed for pain.     .  ferrous fumarate (HEMOCYTE - 106 MG FE) 325 (106 FE) MG TABS  Take 1 tablet (106 mg of iron total) by mouth 2 (two) times daily.  60 each  0   .  guaiFENesin (MUCINEX) 600 MG 12 hr tablet  Take 1 tablet (600 mg total) by mouth 2 (two) times daily as needed.  60 tablet  0   .  ipratropium (ATROVENT) 0.02 % nebulizer solution  Take 0.5 mg by nebulization 4 (four) times daily.     .  [DISCONTINUED] dexlansoprazole (DEXILANT) 60 MG capsule  Take 60 mg by mouth daily.      No current facility-administered medications for this visit.   ROS:  General: No weight loss, Fever, chills  HEENT: No recent headaches, no nasal bleeding,  no visual changes, no sore throat  Neurologic: No dizziness, blackouts, seizures. No recent symptoms of stroke or mini- stroke. No recent episodes of slurred speech, or temporary blindness.  Cardiac: No recent episodes of chest pain/pressure, no shortness of breath at rest. + shortness of breath with exertion. Denies history of atrial fibrillation or irregular heartbeat  Vascular: + history of rest pain in feet. + history of claudication. No history of  non-healing ulcer, No history of DVT  Pulmonary: No home oxygen, no productive cough, no hemoptysis, No asthma or wheezing  Musculoskeletal: [x ] Arthritis, [x ] Low back pain, [ ]  Joint pain  Hematologic:No history of hypercoagulable state. No history of easy bleeding. No history of anemia  Gastrointestinal: No hematochezia or melena, No gastroesophageal reflux, no trouble swallowing  Urinary: [x ] chronic Kidney disease, [ ]  on HD - [ ]  MWF or [ ]  TTHS, [ ]  Burning with urination, [ ]  Frequent urination, [ ]  Difficulty urinating;  Skin: No rashes  Psychological: No history of anxiety, No history of depression  Physical Examination   Filed Vitals:   06/11/13 0546 06/11/13 0615 06/11/13 0644  BP: 212/73 168/54   Pulse: 73    Temp: 97.1 F (36.2 C)    TempSrc: Oral    Resp: 18    Height:   5\' 6"  (1.676 m)  Weight:   126 lb 15.8 oz (57.6 kg)  SpO2: 100%    General: Alert and oriented, no acute distress  HEENT: Tracheostomy  Neck: Bilateral carotid bruit  Pulmonary: Clear to auscultation bilaterally  Cardiac: Regular Rate and Rhythm without murmur  Abdomen: Soft, non-tender, non-distended, no mass  Skin: No rash  Extremity Pulses: 2+ radial, brachial, decrease femoral, absent dorsalis pedis, posterior tibial pulses bilaterally  Musculoskeletal: No deformity or edema  Neurologic: Upper and lower extremity motor 5/5 and symmetric  DATA: ABIs from 02/23/2012 reviewed 0.27 on the left 0.96 on the right. Carotid duplex in the time showed 40-60% left internal carotid artery stenosis less than 40% right internal carotid artery stenosis. 70% right internal carotid artery stenosis by CT Angio in April 2013  ASSESSMENT: Rest pain left foot with severely decreased ABI. Asymptomatic moderate to severe carotid stenosis asymptomatic  PLAN: Left femoral tibial bypass today.  Risks benefits complications discussed limb loss graft thrombosis myocardial events bleeding infection limited graft patency  among others.     Fabienne Bruns, MD  [facility] [place]  Office: [phone]  Pager: [phone]

## 2013-06-11 NOTE — Transfer of Care (Signed)
Immediate Anesthesia Transfer of Care Note  Patient: [name]  Procedure(s) Performed: Procedure(s): BYPASS GRAFT  LEFT FEMORAL- POSTERIOR TIBIAL ARTERY/ REDO (Left) THROMBECTOMY FEMORAL ARTERY (Left)  Patient Location: PACU  Anesthesia Type:General  Level of Consciousness: awake  Airway & Oxygen Therapy: Patient Spontanous Breathing and Patient connected to face mask oxygen  Post-op Assessment: Report given to PACU RN, Post -op Vital signs reviewed and stable and Patient moving all extremities  Post vital signs: Reviewed and stable  Complications: No apparent anesthesia complications

## 2013-06-11 NOTE — Op Note (Signed)
Procedure: Left external iliac to posterior tibial artery bypass using nonreversed ipsilateral greater saphenous vein  Preoperative diagnosis: Rest pain left foot  This upper diagnosis: Same  Anesthesia: Gen.  Assistant: [employer]  Operative findings: #1 good quality saphenous vein 3-4 mm diameter #2 small posterior tibial artery less than 2 mm #3 high femoral bifurcation with proximal aspect of graft anastomosed to old proximal PTFE graft end-to-end  Operative details: After obtaining informed consent, the patient was taken to the operating room. The patient was placed in supine position on the operating room table. After induction of general anesthesia and endotracheal intubation, the patient's entire left lower extremity was prepped and draped in usual sterile fashion. Next a longitudinal incision was made in the left groin through a pre-existing scar. The patient had previously had a left femoral to above-knee popliteal bypass elsewhere. Incision was carried down through the subcutaneous tissues down to level of the pre-existing PTFE graft. This was dissected free circumferentially. I then proceeded to trace this as high as possible in the groin. A few centimeters of the inguinal ligament was incised. The graft continued to proceed proximally. The anastomosis was well above the inguinal ligament. I was able to dissect out the superficial femoral and profunda femoris arteries just below where they bifurcated. The superficial femoral artery did have a pulse within it proximally. It was very diseased. The profunda femoris artery also did have flow within it although it was again very diseased and calcified. At this point I decided to thrombectomize the old femoropopliteal bypass to see if this would give [place] adequate inflow. The graft was ligated distally in the proximal thigh. The graft was then transected. #4 and #5 Fogarty catheters were then used to thrombectomize the proximal aspect of the  graft. Multiple passes were made and then excellent arterial inflow was obtained. This was then clamped proximally. I then proceeded to expose the posterior tibial artery through a medial calf incision. The greater saphenous vein was dissected free through this incision and reflected posteriorly. It was of good quality. It was 3-4 mm in diameter. The incision was deepened down through the fascia and the posterior tibial artery was exposed. This was a fairly small artery. The adjacent medial and lateral veins were teased away from the artery. The artery was approximately 1-2 mm in diameter. Vessel loops were placed proximal and distal to the planned site of arteriotomy.  This was a segment of the mid to proximal third of the posterior tibial artery. Next a greater saphenous vein was harvested through several skip incisions of the thigh. Small side branches were ligated and divided between silk ties or clips. The vein was then transected distally and the saphenofemoral junction and oversewn with a running 5-0 Prolene suture. The vein was placed in a reverse configuration and gently distended with heparinized saline. Several small side branches were repaired with 7-0 Prolene suture. A deep subfascial tunnel was then made from the posterior tibial artery between the heads of the gastrocnemius muscles up to the level of the groin. Patient was then given 7000 units of intravenous heparin. The vein was placed in a nonreversed configuration. The vein was then sewn end of vein to end of the old PTFE bypass graft using a running 6-0 Prolene suture. At completion of the anastomosis the proximal clamp was removed and there was good pulsatile flow the proximal vein graft. This the vein graft was then marked for orientation and brought through the subsartorial tunnel down to  level the posterior tibial artery. The graft was controlled proximally with 2 fine bulldog clamps. Posterior tibial artery was controlled proximally and  distally with fine bulldog clamps. A longitudinal opening was made in the posterior tibial artery. The vein graft was then cut to length with the leg straightened. The graft was then sewn end of vein to side of artery using a running 6 0 and 7-0 Prolene suture. Just prior to completion of the anastomosis it was forebled backbled and thoroughly flushed. The anastomosis was secured. One additional repair suture was placed at the heel of the anastomosis to obtain hemostasis. There was good Doppler flow in the bypass graft as well as the distal posterior tibial artery and the posterior tibial artery at the level of the ankle. This augmented approximately 100% with unclamping of the graft. Next hemostasis was obtained in all incisions. The medial lower leg incision was then closed with a running 3-0 Vicryl suture in the subcutaneous layer and interrupted simple and vertical mattress 3-0 nylon sutures. Saphenectomy incisions were closed in multiple layers with running 3-0 Vicryl suture followed by staples. The groin was closed in multiple layers of 2 0 and 3 0 Vicryl suture followed by 4 0 Vicryl subcuticular stitch. Dermabond was applied the groin incision. The patient tolerated procedure well and there were no complications. Patient was awakened in the operating room extubated and taken to recovery room in stable condition. The patient had palpable posterior tibial pulse at the ankle at the end of the case.  Fabienne Bruns, MD [facility] [place] Office: [phone] Pager: [phone]

## 2013-06-11 NOTE — Anesthesia Procedure Notes (Signed)
Procedure Name: Intubation Date/Time: 06/11/2013 7:44 AM Performed by: Luster Landsberg Pre-anesthesia Checklist: Patient identified, Emergency Drugs available, Suction available and Patient being monitored Patient Re-evaluated:Patient Re-evaluated prior to inductionOxygen Delivery Method: Circle system utilized Preoxygenation: Pre-oxygenation with 100% oxygen Intubation Type: IV induction Tube size: 5.5 mm Number of attempts: 1 Airway Equipment and Method: LTA kit utilized Placement Confirmation: positive ETCO2 and breath sounds checked- equal and bilateral Tube secured with: Tape Dental Injury: Teeth and Oropharynx as per pre-operative assessment  Comments: Pt's existing trach removed/.  LTA thru trach stoma. 5.5 reinforced ETT inserted thru trach stoma.  Breath sounds +/=.  + ETCO2.

## 2013-06-11 NOTE — Progress Notes (Signed)
Pt arrived from PACU, VSS, op pulses dopplerable, will continue to monitor.

## 2013-06-11 NOTE — Anesthesia Preprocedure Evaluation (Addendum)
Anesthesia Evaluation  Patient identified by MRN, date of birth, ID band Patient awake    Reviewed: Allergy & Precautions, H&P , NPO status , Patient's Chart, lab work & pertinent test results, reviewed documented beta blocker date and time   Airway Mallampati: II TM Distance: >3 FB Neck ROM: Full    Dental  (+) Upper Dentures   Pulmonary COPD oxygen dependent,          Cardiovascular hypertension, Pt. on medications and Pt. on home beta blockers + CAD, + Past MI, + CABG, + Peripheral Vascular Disease and +CHF     Neuro/Psych    GI/Hepatic GERD-  Controlled,  Endo/Other  diabetes, Poorly Controlled, Type 2, Insulin DependentHypothyroidism   Renal/GU Renal InsufficiencyRenal disease     Musculoskeletal   Abdominal   Peds  Hematology   Anesthesia Other Findings   Reproductive/Obstetrics                          Anesthesia Physical Anesthesia Plan  ASA: III  Anesthesia Plan: General   Post-op Pain Management:    Induction: Intravenous  Airway Management Planned: Tracheostomy  Additional Equipment: Arterial line  Intra-op Plan:   Post-operative Plan: Extubation in OR and Possible Post-op intubation/ventilation  Informed Consent: I have reviewed the patients History and Physical, chart, labs and discussed the procedure including the risks, benefits and alternatives for the proposed anesthesia with the patient or authorized representative who has indicated his/her understanding and acceptance.   Dental advisory given  Plan Discussed with: CRNA and Surgeon  Anesthesia Plan Comments: (Plan to remove pt's existing 6 trach - uncuffed. Place ETT thru trach stoma.  Discussed with pt)       Anesthesia Quick Evaluation

## 2013-06-11 NOTE — Addendum Note (Signed)
Addendum created 06/11/13 1614 by Luster Landsberg, CRNA   Modules edited: Anesthesia Flowsheet, Anesthesia Medication Administration

## 2013-06-11 NOTE — Anesthesia Postprocedure Evaluation (Signed)
Anesthesia Post Note  Patient: [name]  Procedure(s) Performed: Procedure(s) (LRB): BYPASS GRAFT  LEFT FEMORAL- POSTERIOR TIBIAL ARTERY/ REDO (Left) THROMBECTOMY FEMORAL ARTERY (Left)  Anesthesia type: general  Patient location: PACU  Post pain: Pain level controlled  Post assessment: Patient's Cardiovascular Status Stable  Last Vitals:  Filed Vitals:   06/11/13 1516  BP: 150/59  Pulse: 67  Temp:   Resp: 15    Post vital signs: Reviewed and stable  Level of consciousness: sedated  Complications: No apparent anesthesia complications

## 2013-06-12 ENCOUNTER — Encounter (HOSPITAL_COMMUNITY): Payer: Self-pay | Admitting: *Deleted

## 2013-06-12 DIAGNOSIS — I739 Peripheral vascular disease, unspecified: Secondary | ICD-10-CM

## 2013-06-12 LAB — CBC
HCT: 29.6 % — ABNORMAL LOW (ref 36.0–46.0)
Hemoglobin: 10.1 g/dL — ABNORMAL LOW (ref 12.0–15.0)
RBC: 3.42 MIL/uL — ABNORMAL LOW (ref 3.87–5.11)

## 2013-06-12 LAB — HEMOGLOBIN A1C
Hgb A1c MFr Bld: 8.4 % — ABNORMAL HIGH (ref <5.7)
Mean Plasma Glucose: 194 mg/dL — ABNORMAL HIGH (ref <117)

## 2013-06-12 LAB — BASIC METABOLIC PANEL
BUN: 27 mg/dL — ABNORMAL HIGH (ref 6–23)
CO2: 22 mEq/L (ref 19–32)
Chloride: 109 mEq/L (ref 96–112)
Creatinine, Ser: 1.23 mg/dL — ABNORMAL HIGH (ref 0.50–1.10)
GFR calc Af Amer: 51 mL/min — ABNORMAL LOW (ref >90)
Potassium: 4.5 mEq/L (ref 3.5–5.1)

## 2013-06-12 LAB — GLUCOSE, CAPILLARY: Glucose-Capillary: 175 mg/dL — ABNORMAL HIGH (ref 70–99)

## 2013-06-12 NOTE — Progress Notes (Signed)
VASCULAR LAB PRELIMINARY  ARTERIAL  ABI completed:    RIGHT    LEFT    PRESSURE WAVEFORM  PRESSURE WAVEFORM  BRACHIAL 164 triphasic BRACHIAL  triphasic  DP   DP    AT 88 monophasic AT 62 Severely dampened monophasic  PT 114 monophasic PT 94 monophasic  PER   PER    GREAT TOE  NA GREAT TOE  NA    RIGHT LEFT  ABI 0.7 0.57     Chantale Leugers, RVT 06/12/2013, 2:37 PM

## 2013-06-12 NOTE — Progress Notes (Signed)
Utilization review completed.  

## 2013-06-12 NOTE — Clinical Documentation Improvement (Signed)
THIS DOCUMENT IS NOT A PERMANENT PART OF THE MEDICAL RECORD  Please update your documentation with the medical record to reflect your response to this query. If you need help knowing how to do this please call [phone].  06/12/13   Dear Dr. Weyman Croon,  In a better effort to capture your patient's severity of illness, reflect appropriate length of stay and utilization of resources, a review of the patient medical record has revealed the following indicators.    Based on your clinical judgment, please clarify and document in a progress note and/or discharge summary the clinical condition associated with the following supporting information:   Possible Clinical Conditions?   _______CKD Stage II - GFR 60-80  _______CKD Stage III - GFR 30-59   _______Other condition  _______Cannot Clinically determine     Risk Factors:  Patient with a history of CKD noted per 8/19 H&P.   Lab: Bun:  8/20:  27   Creat: 8/20:  1.23  GFR:  8/20:  51   You may use possible, probable, or suspect with inpatient documentation. possible, probable, suspected diagnoses MUST be documented at the time of discharge  Reviewed: CKD stage 3 documented per 8/21 progress notes.  Thank You,  Marciano Sequin,  Clinical Documentation Specialist: [phone] Health Information Management [place]

## 2013-06-12 NOTE — Clinical Documentation Improvement (Signed)
THIS DOCUMENT IS NOT A PERMANENT PART OF THE MEDICAL RECORD  Please update your documentation with the medical record to reflect your response to this query. If you need help knowing how to do this please call [phone].  06/12/13   Dear Dr. Fields:/Associates,  In a better effort to capture your patient's severity of illness, reflect appropriate length of stay and utilization of resources, a review of the patient medical record has revealed the following indicators.    Based on your clinical judgment, please clarify and document in a progress note and/or discharge summary the clinical condition associated with the following supporting information:    Please clarify and specify Diabetes control, manifestations, and associated conditions.   Possible Clinical Conditions?   _______Controlled or uncontrolled  Manifestations:  _______DM retinopathy  _______DM PVD _______DM neuropathy   _______DM nephropathy  Associated conditions: _______DM cellulitis _______DM gangrene _______DM gastroparesis _______DM hyperosmolarity state _______DM ketoacidosis with or without coma _______DM osteomyelitis _______DM skin ulcer  _______Other Condition _______Cannot Clinically determine     Risk Factors: History of diabetes mellitus, type II noted per 8/19 H&P.   Lab: Hgb A1c level: 8/20:  8.4  Mean plasma glucose: 8/20:  194   Treatment: novolog insulin: 0-15 units TID lantus insulin:  22 units QHS  You may use possible, probable, or suspect with inpatient documentation. possible, probable, suspected diagnoses MUST be documented at the time of discharge  Reviewed:  Diabetic neuropathy documented per 8/21 progress notes.                                         Thank You,  Marciano Sequin,  Clinical Documentation Specialist: [phone] Health Information Management [place]

## 2013-06-12 NOTE — Progress Notes (Signed)
[facility] [place]  Subjective  - Some leg soreness, left foot feels better  Objective 125/60 77 98 F (36.7 C) (Oral) 22 100%  Intake/Output Summary (Last 24 hours) at 06/12/13 0844 Last data filed at 06/12/13 0802  Gross per 24 hour  Intake 3706.67 ml  Output   1575 ml  Net 2131.67 ml   Left foot and calf some edema, no hematoma, brisk biphasic PT doppler  Assessment/Planning: POD 1 s/p left fem PT bypass Ambulate Transfer to 2000 Possible d/c Friday Diabetes mellitus sliding scale for now Acute blood loss anemia improved post intraop transfusion  [name] E 06/12/2013 8:44 AM --  Laboratory Lab Results:  Recent Labs  06/11/13 1253 06/12/13 0450  WBC  --  8.0  HGB 7.8* 10.1*  HCT 23.0* 29.6*  PLT  --  138*   BMET  Recent Labs  06/11/13 1253 06/12/13 0450  NA 141 140  K 4.7 4.5  CL  --  109  CO2  --  22  GLUCOSE 190* 156*  BUN  --  27*  CREATININE  --  1.23*  CALCIUM  --  7.9*    COAG Lab Results  Component Value Date   INR 1.09 06/03/2013   INR 1.06 12/15/2011   No results found for this basename: PTT

## 2013-06-12 NOTE — Progress Notes (Signed)
In to visit with patient at bedside.  She is familiar with writer's liaison role.  She was on the phone alert and jovial.  Will continue to monitor her care.  Patient is currently active with [facility] Management for chronic disease management services.  Patient will receive a post discharge transition of care call and will be evaluated for monthly home visits for assessments and disease process education.  Of note, [facility] Care Management services does not replace or interfere with any services that are arranged by inpatient case management or social work.  For additional questions or referrals please contact Anibal Henderson BSN RN [facility] Liaison at [phone].

## 2013-06-12 NOTE — Progress Notes (Signed)
Physical Therapy Evaluation Patient Details Name: Berenise Hunton MRN: [id number] DOB: [birth year] Today's Date: 06/12/2013 Time: 0865-7846 PT Time Calculation (min): 16 min  PT Assessment / Plan / Recommendation History of Present Illness  Pt admit for left fem/post tib BP redo and femoral thrombectomy.  Clinical Impression  Pt admitted with above. Pt currently with functional limitations due to the deficits listed below (see PT Problem List). Should progress well.   Pt will benefit from skilled PT to increase their independence and safety with mobility to allow discharge to the venue listed below.     PT Assessment  Patient needs continued PT services    Follow Up Recommendations  Home health PT;Supervision/Assistance - 24 hour                Equipment Recommendations  None recommended by PT         Frequency Min 3X/week    Precautions / Restrictions Precautions Precautions: Fall Restrictions Weight Bearing Restrictions: No   Pertinent Vitals/Pain VSS, some pain      Mobility  Bed Mobility Bed Mobility: Supine to Sit;Sitting - Scoot to [place] of Bed;Sit to Supine Supine to Sit: 4: Min assist;With rails;HOB elevated Sitting - Scoot to Edge of Bed: 4: Min guard Sit to Supine: 4: Min assist;HOB flat Details for Bed Mobility Assistance: Assist to support LLE in/out of bed. Heavily relying on rails for support in bringing trunk OOB.   Transfers Transfers: Sit to Stand;Stand to Sit Sit to Stand: 4: Min assist;From bed;With upper extremity assist Stand to Sit: 4: Min assist;To bed;With upper extremity assist Details for Transfer Assistance: VCs for safe hand placement. Ambulation/Gait Ambulation/Gait Assistance: 4: Min assist Ambulation Distance (Feet): 25 Feet Assistive device: Rolling walker Ambulation/Gait Assistance Details: Assist and cues to steer RW and sequence steps.  Pt limited by pain in left LE. Gait Pattern: Step-to pattern;Decreased stride length Gait  velocity: decreased Stairs: No Wheelchair Mobility Wheelchair Mobility: No         PT Diagnosis: Generalized weakness;Acute pain  PT Problem List: Decreased activity tolerance;Decreased balance;Decreased mobility;Decreased knowledge of use of DME;Decreased safety awareness;Decreased knowledge of precautions;Pain PT Treatment Interventions: DME instruction;Gait training;Stair training;Functional mobility training;Therapeutic activities;Therapeutic exercise;Balance training;Patient/family education     PT Goals(Current goals can be found in the care plan section) Acute Rehab PT Goals Patient Stated Goal: to return home PT Goal Formulation: With patient Time For Goal Achievement: 06/19/13 Potential to Achieve Goals: Good  Visit Information  Last PT Received On: 06/12/13 Assistance Needed: +1 PT/OT Co-Evaluation/Treatment: Yes History of Present Illness: Pt admit for left fem/post tib BP redo and femoral thrombectomy.       Prior Functioning  Home Living Family/patient expects to be discharged to:: Private residence Living Arrangements: Children Available Help at Discharge: Family;Available 24 hours/day Type of Home: Apartment Home Access: Level entry Home Layout: One level Home Equipment: Walker - 2 wheels;Cane - single point Prior Function Level of Independence: Independent Communication Communication: Tracheostomy;Passy-Muir valve Dominant Hand: Right    Cognition  Cognition Arousal/Alertness: Awake/alert Behavior During Therapy: WFL for tasks assessed/performed Overall Cognitive Status: Within Functional Limits for tasks assessed    Extremity/Trunk Assessment Upper Extremity Assessment Upper Extremity Assessment: Defer to OT evaluation Lower Extremity Assessment Lower Extremity Assessment: LLE deficits/detail LLE Deficits / Details: grossly 3+/5 but sore Cervical / Trunk Assessment Cervical / Trunk Assessment: Normal   Balance Balance Balance Assessed:  Yes Static Standing Balance Static Standing - Balance Support: Bilateral upper extremity supported;During functional activity Static Standing -  Level of Assistance: 4: Min assist Static Standing - Comment/# of Minutes: 2 min with RW  End of Session PT - End of Session Equipment Utilized During Treatment: Gait belt Activity Tolerance: Patient limited by fatigue Patient left: in bed;with call bell/phone within reach Nurse Communication: Mobility status       INGOLD,Shawndell Varas 06/12/2013, 1:49 PM [facility] Acute Rehabilitation [phone] (pager)

## 2013-06-12 NOTE — Evaluation (Signed)
Occupational Therapy Evaluation Patient Details Name: [place] Oborn MRN: [id number] DOB: [birth year] Today's Date: 06/12/2013 Time: 4098-1191 OT Time Calculation (min): 16 min  OT Assessment / Plan / Recommendation History of present illness  Pt s/p BPG left fem- post tib / left thrombectomy fem artery.   Clinical Impression    Pt s/p BPG left fem- post tib / left thrombectomy fem artery.  Will benefit from continued acute OT services to address below problem list. Recommending 24/7 supervision/assist at home for d/c planning, which pt states her family will be able to provide.     OT Assessment  Patient needs continued OT Services    Follow Up Recommendations  No OT follow up;Supervision/Assistance - 24 hour    Barriers to Discharge      Equipment Recommendations  None recommended by OT    Recommendations for Other Services    Frequency  Min 2X/week    Precautions / Restrictions     Pertinent Vitals/Pain See vitals    ADL  Upper Body Dressing: Performed;Set up Where Assessed - Upper Body Dressing: Unsupported sitting Lower Body Dressing: Performed;+1 Total assistance Where Assessed - Lower Body Dressing: Unsupported sitting Toilet Transfer: Simulated;Minimal assistance Toilet Transfer Method: Sit to stand Toilet Transfer Equipment: Other (comment) (ambulating around bed) Equipment Used: Gait belt;Rolling walker Transfers/Ambulation Related to ADLs: Pt ambulated in room with RW with min assist. Cues for sequencing and technique.  ADL Comments: Pt at first declining OOB mobility due to fatigue from sitting up in chair earlier in AM but evenutally agreeable to mobilizing within room. Declining need to use bathroom.     OT Diagnosis: Generalized weakness;Acute pain  OT Problem List: Decreased strength;Decreased activity tolerance;Impaired balance (sitting and/or standing);Decreased knowledge of use of DME or AE;Pain OT Treatment Interventions: Self-care/ADL training;DME  and/or AE instruction;Therapeutic activities;Patient/family education;Balance training   OT Goals(Current goals can be found in the care plan section) Acute Rehab OT Goals Patient Stated Goal: to return home OT Goal Formulation: With patient Time For Goal Achievement: 06/19/13 Potential to Achieve Goals: Good  Visit Information  Last OT Received On: 06/12/13 Assistance Needed: +1 PT/OT Co-Evaluation/Treatment: Yes       Prior Functioning     Home Living Family/patient expects to be discharged to:: Private residence Living Arrangements: Children Available Help at Discharge: Family;Available 24 hours/day Type of Home: Apartment Home Access: Level entry Home Layout: One level Home Equipment: Walker - 2 wheels;Cane - single point Prior Function Level of Independence: Independent Communication Communication: Tracheostomy;Passy-Muir valve Dominant Hand: Right         Vision/Perception     Cognition  Cognition Arousal/Alertness: Awake/alert Behavior During Therapy: WFL for tasks assessed/performed Overall Cognitive Status: Within Functional Limits for tasks assessed    Extremity/Trunk Assessment Upper Extremity Assessment Upper Extremity Assessment: Overall WFL for tasks assessed     Mobility Bed Mobility Bed Mobility: Supine to Sit;Sitting - Scoot to [place] of Bed;Sit to Supine Supine to Sit: 4: Min assist;With rails;HOB elevated Sitting - Scoot to Edge of Bed: 4: Min guard Sit to Supine: 4: Min assist;HOB flat Details for Bed Mobility Assistance: Assist to support LLE in/out of bed. Heavily relying on rails for support in bringing trunk OOB.   Transfers Transfers: Sit to Stand;Stand to Sit Sit to Stand: 4: Min assist;From bed;With upper extremity assist Stand to Sit: 4: Min assist;To bed;With upper extremity assist Details for Transfer Assistance: VCs for safe hand placement.     Exercise     Balance  End of Session OT - End of Session Equipment  Utilized During Treatment: Gait belt;Rolling walker Activity Tolerance: Patient tolerated treatment well Patient left: in bed;with call bell/phone within reach Nurse Communication: Mobility status  GO   06/12/2013 Cipriano Mile OTR/L Pager [phone] Office [phone]   Cipriano Mile 06/12/2013, 12:14 PM

## 2013-06-12 NOTE — Progress Notes (Signed)
Pt tx per MD order, pt tol well, pt verbalized understanding of tx, all questions answered, report called to receiving RN, pt foley d/c approx 0700, no urge to void, up to [facility] to attempt to void, all questions answered

## 2013-06-13 LAB — GLUCOSE, CAPILLARY
Glucose-Capillary: 179 mg/dL — ABNORMAL HIGH (ref 70–99)
Glucose-Capillary: 247 mg/dL — ABNORMAL HIGH (ref 70–99)
Glucose-Capillary: 237 mg/dL — ABNORMAL HIGH (ref 70–99)

## 2013-06-13 MED ORDER — IPRATROPIUM BROMIDE 0.02 % IN SOLN
0.5000 mg | Freq: Two times a day (BID) | RESPIRATORY_TRACT | Status: DC
  Administered 2013-06-14 – 2013-06-16 (×5): 0.5 mg via RESPIRATORY_TRACT
  Filled 2013-06-13 (×5): qty 2.5

## 2013-06-13 MED ORDER — ALBUTEROL SULFATE (5 MG/ML) 0.5% IN NEBU
2.5000 mg | INHALATION_SOLUTION | Freq: Two times a day (BID) | RESPIRATORY_TRACT | Status: DC
  Administered 2013-06-14 – 2013-06-16 (×5): 2.5 mg via RESPIRATORY_TRACT
  Filled 2013-06-13 (×5): qty 0.5

## 2013-06-13 NOTE — Care Management Note (Unsigned)
    Page 1 of 1   06/13/2013     3:25:34 PM   CARE MANAGEMENT NOTE 06/13/2013  Patient:  [name], [name]   Account Number:  [id number]  Date Initiated:  06/12/2013  Documentation initiated by:  [name]  Subjective/Objective Assessment:   Pt admitted s/p iliac to posterior tibial artery bypass     Action/Plan:   PTA pt lived at home- NCM to follow post op progression for d/c needs   Anticipated DC Date:  06/14/2013   Anticipated DC Plan:  HOME W HOME HEALTH SERVICES      DC Planning Services  CM consult      Choice offered to / List presented to:             Status of service:  In process, will continue to follow Medicare Important Message given?   (If response is "NO", the following Medicare IM given date fields will be blank) Date Medicare IM given:   Date Additional Medicare IM given:    Discharge Disposition:    Per UR Regulation:  Reviewed for med. necessity/level of care/duration of stay  If discussed at Long Length of Stay Meetings, dates discussed:    Comments:  06/13/13 Albana Saperstein,RN,BSN [phone] PHYSICAL THERAPY RECOMMENDING HHPT AT DC WITH 24H ASSISTANCE AT DC.  MD, PLEASE LEAVE HH ORDERS FOR FOLLOW UP.  THANKS.

## 2013-06-13 NOTE — Progress Notes (Signed)
Nursing Note\  Per Nursing protocol, bladder scanned patient, found with baldder scan, I &O cath per protocol and urine returned will continue to monitor patient. Mykel Mohl, Randall An rN

## 2013-06-13 NOTE — Progress Notes (Signed)
Patient had a low grade temperature of 100.0.  Patient was given two tylenol at 2204.  Follow up temperature an hour later was 99.6. Will continue to monitor.

## 2013-06-13 NOTE — Progress Notes (Signed)
Physical Therapy Treatment Patient Details Name: [name] MRN: [id number] DOB: 03/12/1946 Today's Date: 06/13/2013 Time: 4098-1191 PT Time Calculation (min): 16 min  PT Assessment / Plan / Recommendation  History of Present Illness Pt admit for left fem/post tib BP redo and femoral thrombectomy.   PT Comments   Pt ambulated in hallway with increased L LE pain and fatigue limiting distance.  RN notified of pain.  Follow Up Recommendations  Home health PT;Supervision/Assistance - 24 hour     Does the patient have the potential to tolerate intense rehabilitation     Barriers to Discharge        Equipment Recommendations  None recommended by PT    Recommendations for Other Services    Frequency     Progress towards PT Goals Progress towards PT goals: Progressing toward goals  Plan Current plan remains appropriate    Precautions / Restrictions Precautions Precautions: Fall   Pertinent Vitals/Pain 6/10 pain L LE at rest, premedicated, RN notified    Mobility  Bed Mobility Bed Mobility: Supine to Sit;Sitting - Scoot to Edge of Bed;Sit to Supine Supine to Sit: With rails;HOB elevated;5: Supervision Sitting - Scoot to Edge of Bed: 5: Supervision Sit to Supine: HOB flat;5: Supervision Details for Bed Mobility Assistance: increased time due to pain Transfers Transfers: Sit to Stand;Stand to Sit Sit to Stand: From bed;With upper extremity assist;4: Min guard Stand to Sit: To bed;With upper extremity assist;5: Supervision Details for Transfer Assistance: VCs for safe hand placement. Ambulation/Gait Ambulation/Gait Assistance: 4: Min guard;5: Supervision Ambulation Distance (Feet): 50 Feet Assistive device: Rolling walker Ambulation/Gait Assistance Details: verbal cues for initial sequence and WBing through RW with stepping onto L LE for better pain control, standing rest break after 25 feet with back against wall due to fatigue and pain Gait Pattern: Step-to  pattern;Decreased stride length Gait velocity: decreased Stairs: No Wheelchair Mobility Wheelchair Mobility: No    Exercises     PT Diagnosis:    PT Problem List:   PT Treatment Interventions:     PT Goals (current goals can now be found in the care plan section)    Visit Information  Last PT Received On: 06/13/13 Assistance Needed: +1 History of Present Illness: Pt admit for left fem/post tib BP redo and femoral thrombectomy.    Subjective Data      Cognition  Cognition Arousal/Alertness: Awake/alert Behavior During Therapy: WFL for tasks assessed/performed Overall Cognitive Status: Within Functional Limits for tasks assessed    Balance     End of Session PT - End of Session Activity Tolerance: Patient limited by fatigue;Patient limited by pain Patient left: in bed;with call bell/phone within reach   GP     [facility] E 06/13/2013, 12:53 PM Zenovia Jarred, PT, DPT 06/13/2013 Pager: [phone]

## 2013-06-13 NOTE — Progress Notes (Signed)
Patient had not voided since her catheter was removed. Patient was put on the bedside commode and attempted to void but did not feel the urge to void. Bladder was scanned which revealed of urine.  Patient denied any discomfort at that time.  Patient wanted to attempt to void again before have an I&O cath, which was unsuccessful.  Patient was I&O catheterized at 0100 and of urine was obtained.  Patient expressed relief. Will continue to monitor.

## 2013-06-13 NOTE — Progress Notes (Signed)
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

## 2013-06-13 NOTE — Progress Notes (Signed)
1415 Patient still without spontaniously voiding, Lianne Cure [facility] made aware and order received to place foley catheter, 14 F catheter placed with out dificulty. Will continue to montior patient. Alano Blasco, Randall An RN

## 2013-06-14 LAB — TYPE AND SCREEN
ABO/RH(D): O POS
Antibody Screen: NEGATIVE
Unit division: 0
Unit division: 0

## 2013-06-14 LAB — GLUCOSE, CAPILLARY: Glucose-Capillary: 236 mg/dL — ABNORMAL HIGH (ref 70–99)

## 2013-06-14 NOTE — Progress Notes (Signed)
Inpatient Diabetes Program Recommendations  AACE/ADA: New Consensus Statement on Inpatient Glycemic Control (2013)  Target Ranges:  Prepandial:   less than 140 mg/dL      Peak postprandial:   less than 180 mg/dL (1-2 hours)      Critically ill patients:  140 - 180 mg/dL     Results for [name], [name] (MRN [id number]) as of 06/14/2013 08:55  Ref. Range 06/13/2013 06:30 06/13/2013 11:20 06/13/2013 16:12 06/13/2013 21:11  Glucose-Capillary Latest Range: 70-99 mg/dL 409 (H) 811 (H) 914 (H) 237 (H)    **Patient having elevated postprandial CBGs.  Eating 50-75% of meals.  **MD- Please consider adding meal coverage while patient in hospital- Novolog 3 units tid with meals   Will follow. Ambrose Finland RN, MSN, CDE Diabetes Coordinator Inpatient Diabetes Program [phone]

## 2013-06-14 NOTE — Progress Notes (Signed)
Physical Therapy Treatment Patient Details Name: [name] MRN: [id number] DOB: 1945/12/21 Today's Date: 06/14/2013 Time: 4098-1191 PT Time Calculation (min): 29 min  PT Assessment / Plan / Recommendation  History of Present Illness Pt admit for left fem/post tib BP redo and femoral thrombectomy.   PT Comments   Patient progressing with activity tolerance, but struggled today with bed mobility.  States daughter can help her with getting up in the morning, but will be at work during the day.  Concern for immediate post d/c fall risk and would prefer 24 hour assist.  Did mobility once on her feet with supervision including turning and backing up to chair.  Continue to recommend HHPT at d/c and prefer at least initial 24 hour assist (which unless her daughter works weekends should happen if she is able to d/c today.)  Follow Up Recommendations  Home health PT;Supervision/Assistance - 24 hour        Barriers to Discharge  Decreased caregiver support      Equipment Recommendations  None recommended by PT    Recommendations for Other Services  None  Frequency Min 3X/week   Progress towards PT Goals Progress towards PT goals: Progressing toward goals  Plan Current plan remains appropriate    Precautions / Restrictions Precautions Precautions: Fall   Pertinent Vitals/Pain 8/10 left leg    Mobility  Bed Mobility Supine to Sit: 4: Min assist Sitting - Scoot to Edge of Bed: 4: Min assist Transfers Sit to Stand: 5: Supervision;4: Min guard;From bed Stand to Sit: To chair/3-in-1 Details for Transfer Assistance: stood initially without assist, then needed minguard due to posterior bias initially standing Ambulation/Gait Ambulation/Gait Assistance: 5: Supervision Ambulation Distance (Feet): 70 Feet Assistive device: Rolling walker Ambulation/Gait Assistance Details: cues initially for sequence, rolling walker (not picking it up,) and for weight bearing through arms to help pain in  leg; stopped to lean on walker after about 40' Gait Pattern: Step-to pattern;Decreased stride length;Antalgic    Exercises General Exercises - Lower Extremity Ankle Circles/Pumps: AROM;10 reps;Both Long Arc Quad: AROM;Left;10 reps;Seated Heel Slides: AROM;Left;10 reps;Seated Hip ABduction/ADduction: AROM;10 reps;Left;Seated     PT Goals (current goals can now be found in the care plan section)    Visit Information  Last PT Received On: 06/14/13 History of Present Illness: Pt admit for left fem/post tib BP redo and femoral thrombectomy.    Subjective Data   My daughter will be at work during the day.   Cognition  Cognition Arousal/Alertness: Awake/alert Behavior During Therapy: WFL for tasks assessed/performed Overall Cognitive Status: Within Functional Limits for tasks assessed    Balance  Static Standing Balance Static Standing - Balance Support: Bilateral upper extremity supported Static Standing - Level of Assistance: 5: Stand by assistance;4: Min assist Static Standing - Comment/# of Minutes: initially support due to posterior bias, then supervision throughout rest of session  End of Session PT - End of Session Equipment Utilized During Treatment: Gait belt Activity Tolerance: Patient limited by pain Patient left: in chair;with call bell/phone within reach;with nursing/sitter in room Nurse Communication: Patient requests pain meds   GP     [facility] 06/14/2013, 10:26 AM Sheran Lawless, PT [phone] 06/14/2013

## 2013-06-14 NOTE — Progress Notes (Signed)
[facility] [place]  Subjective  - Could not pass urine last night   Objective 147/99 76 98.8 F (37.1 C) (Oral) 16 96%  Intake/Output Summary (Last 24 hours) at 06/14/13 0953 Last data filed at 06/14/13 0730  Gross per 24 hour  Intake    360 ml  Output   1825 ml  Net  -1465 ml   Left leg incisions healing small amount of serous drainage Left foot warm pink PT doppler  Assessment/Planning: D/c foley now D/c home today if able to void  FIELDS,CHARLES E 06/14/2013 9:53 AM --  Laboratory Lab Results:  Recent Labs  06/11/13 1253 06/12/13 0450  WBC  --  8.0  HGB 7.8* 10.1*  HCT 23.0* 29.6*  PLT  --  138*   BMET  Recent Labs  06/11/13 1253 06/12/13 0450  NA 141 140  K 4.7 4.5  CL  --  109  CO2  --  22  GLUCOSE 190* 156*  BUN  --  27*  CREATININE  --  1.23*  CALCIUM  --  7.9*    COAG Lab Results  Component Value Date   INR 1.09 06/03/2013   INR 1.06 12/15/2011   No results found for this basename: PTT

## 2013-06-14 NOTE — Progress Notes (Signed)
Pt unable to void when up to bsc 6 hours post foley removal, bladder scan 600, In & out cath  per protocol  performed by [facility] NT with RN. 700 ml clear yellow urine obtained. Pt tolerated well. Egbert Garibaldi A

## 2013-06-14 NOTE — Progress Notes (Signed)
Occupational Therapy Treatment Patient Details Name: [name] MRN: [id number] DOB: 01/01/1946 Today's Date: 06/14/2013 Time: 4098-1191 OT Time Calculation (min): 24 min  OT Assessment / Plan / Recommendation  History of present illness Pt admit for left fem/post tib BP redo and femoral thrombectomy.   OT comments  Pt making steady progress. Pt will NOT have 24/7 S next week, as her daughter works during the day. Feel pt would benefit from HHOT. Also recommend use of BSC to reduce risk of falls. Discussed with pt. Will follow to facilitate D/C home.   Follow Up Recommendations  Home health OT;Supervision/Assistance - 24 hour;Supervision initailly, then- Intermittent    Barriers to Discharge       Equipment Recommendations  3 in 1 bedside comode    Recommendations for Other Services    Frequency Min 2X/week   Progress towards OT Goals Progress towards OT goals: Progressing toward goals  Plan Discharge plan needs to be updated    Precautions / Restrictions Precautions Precautions: Fall   Pertinent Vitals/Pain no apparent distress     ADL  Lower Body Dressing: Moderate assistance Where Assessed - Lower Body Dressing: Supported sit to Pharmacist, hospital: Minimal assistance Statistician Method: Sit to Barista: Bedside commode Equipment Used: Rolling walker Transfers/Ambulation Related to ADLs: min A ADL Comments: Pt would benefit from [facility]. Educated on use of reacher for LB ADL    OT Diagnosis:    OT Problem List:   OT Treatment Interventions:     OT Goals(current goals can now be found in the care plan section) Acute Rehab OT Goals Patient Stated Goal: to return home OT Goal Formulation: With patient Time For Goal Achievement: 06/19/13 Potential to Achieve Goals: Good ADL Goals Pt Will Perform Lower Body Dressing: with supervision;sit to/from stand Pt Will Transfer to Toilet: with supervision;ambulating;bedside commode Pt Will  Perform Toileting - Clothing Manipulation and hygiene: with supervision;sit to/from stand Additional ADL Goal #1: Pt will perform bed mobility with HOB flat at supervision level without use of rails as precursor for EOB ADLs.  Visit Information  Last OT Received On: 06/14/13 Assistance Needed: +1 History of Present Illness: Pt admit for left fem/post tib BP redo and femoral thrombectomy.    Subjective Data      Prior Functioning       Cognition  Cognition Arousal/Alertness: Awake/alert Behavior During Therapy: WFL for tasks assessed/performed Overall Cognitive Status: Within Functional Limits for tasks assessed    Mobility  Bed Mobility Supine to Sit: 4: Min assist Sitting - Scoot to Edge of Bed: 4: Min assist Transfers Transfers: Sit to Stand;Stand to Sit Sit to Stand: 4: Min guard;From chair/3-in-1 Stand to Sit: 4: Min guard;To chair/3-in-1 Details for Transfer Assistance: min guard for balance during transfers    Exercises  General Exercises - Lower Extremity Ankle Circles/Pumps: AROM;10 reps;Both Long Arc Quad: AROM;Left;10 reps;Seated Heel Slides: AROM;Left;10 reps;Seated Hip ABduction/ADduction: AROM;10 reps;Left;Seated Other Exercises Other Exercises: general UB AROM/general strengthening ex   Balance Static Standing Balance Static Standing - Balance Support: Bilateral upper extremity supported Static Standing - Level of Assistance: 5: Stand by assistance;4: Min assist Static Standing - Comment/# of Minutes: initially support due to posterior bias, then supervision throughout rest of session   End of Session OT - End of Session Equipment Utilized During Treatment: Rolling walker Activity Tolerance: Patient tolerated treatment well Patient left: in chair;with call bell/phone within reach Nurse Communication: Mobility status  GO     Zanyia Silbaugh,HILLARY 06/14/2013, 12:53  PM [facility], OTR/L  [phone] 06/14/2013

## 2013-06-15 LAB — GLUCOSE, CAPILLARY: Glucose-Capillary: 196 mg/dL — ABNORMAL HIGH (ref 70–99)

## 2013-06-15 MED ORDER — FLEET ENEMA 7-19 GM/118ML RE ENEM
1.0000 | ENEMA | Freq: Once | RECTAL | Status: AC
  Administered 2013-06-16: 1 via RECTAL
  Filled 2013-06-15: qty 1

## 2013-06-15 NOTE — Progress Notes (Addendum)
[facility] [place]  Subjective  - Patient continues to not have the urge to void.  Bladder scan last evening showed 600 cc urine retention.  In/out cath produced 700 cc of clear yellow urine.  Planned bladder scan for 0800 this am.   Objective 159/67 80 98.2 F (36.8 C) (Oral) 18 99%  Intake/Output Summary (Last 24 hours) at 06/15/13 0737 Last data filed at 06/15/13 0100  Gross per 24 hour  Intake    880 ml  Output   1290 ml  Net   -410 ml    Left LE warm to touch Above knee incision with SS drainage, below knee incision with ss drainage dry dressing in place.   Assessment/Planning: Procedure(s): BYPASS GRAFT  LEFT FEMORAL- POSTERIOR TIBIAL ARTERY/ REDO THROMBECTOMY FEMORAL ARTERY  4 Days Post-OpSurgeon(s): Sherren Kerns, MD  Pending independent voiding patient will be discharged home. PT/OT recommend home health.  No equipment needs recommended.  Clinton Gallant [facility] 06/15/2013 7:37 AM --  Laboratory Lab Results: No results found for this basename: WBC, HGB, HCT, PLT,  in the last 72 hours BMET No results found for this basename: NA, K, CL, CO2, GLUCOSE, BUN, CREATININE, CALCIUM,  in the last 72 hours  COAG Lab Results  Component Value Date   INR 1.09 06/03/2013   INR 1.06 12/15/2011   No results found for this basename: PTT   Agree with above Patent bypass healing incisions Most likely has diabetes related bladder dysfunction Voiding trials today I and O cath  Hopefully home today  Fabienne Bruns, MD [facility] [place] Office: [phone] Pager: [phone]

## 2013-06-15 NOTE — Progress Notes (Signed)
Patient has not voided since her foley has been removed. At 8 am the bladder scan scan showed 872 ml. Patient has urinated 100 ml this morning and 100 ml this afternoon. Patient did not want to have a foley reinserted. Patient has not had a BM since prior to surgery on 06/09/13. Dr. Darrick Penna said she could have a Fleets enema and patient refused. Will continue to monitor closely. Lajuana Matte, RN

## 2013-06-16 LAB — GLUCOSE, CAPILLARY
Glucose-Capillary: 220 mg/dL — ABNORMAL HIGH (ref 70–99)
Glucose-Capillary: 220 mg/dL — ABNORMAL HIGH (ref 70–99)

## 2013-06-16 MED ORDER — TRAMADOL HCL 50 MG PO TABS: 50.0000 mg | ORAL_TABLET | Freq: Four times a day (QID) | ORAL | Status: DC | PRN

## 2013-06-16 MED ORDER — POLYETHYLENE GLYCOL 3350 17 G PO PACK
17.0000 g | PACK | Freq: Once | ORAL | Status: AC
  Administered 2013-06-16: 17 g via ORAL
  Filled 2013-06-16: qty 1

## 2013-06-16 NOTE — Progress Notes (Signed)
No complaints, urine retention issues seem to be resolving  Filed Vitals:   06/15/13 2049 06/16/13 0014 06/16/13 0340 06/16/13 0556  BP:    141/49  Pulse:  74 64 63  Temp:    98.8 F (37.1 C)  TempSrc:    Oral  Resp:  18 18 16   Height:      Weight:      SpO2: 96% 100% 100% 100%   Left lower extremity: warm left foot, still some edema and small amount of serous drainage mid leg  A/P  s/p fem tib patent bypass, urinary retention resolving, constipation will give Miralax this a.m. Home this afternoon.  Fabienne Bruns, MD [facility] [place] Office: [phone] Pager: [phone]

## 2013-06-16 NOTE — Progress Notes (Signed)
   CARE MANAGEMENT NOTE 06/16/2013  Patient:  [name], [name]   Account Number:  [id number]  Date Initiated:  06/12/2013  Documentation initiated by:  [name]  Subjective/Objective Assessment:   Pt admitted s/p iliac to posterior tibial artery bypass     Action/Plan:   PTA pt lived at home- NCM to follow post op progression for d/c needs   Anticipated DC Date:  06/14/2013   Anticipated DC Plan:  HOME W HOME HEALTH SERVICES      DC Planning Services  CM consult      [facility] Choice  HOME HEALTH   Choice offered to / List presented to:  C-1 Patient   DME arranged  3-N-1      DME agency  [facility]     HH arranged  HH-2 PT  HH-3 OT      [facility] agency  [facility]   Status of service:  Completed, signed off Medicare Important Message given?   (If response is "NO", the following Medicare IM given date fields will be blank) Date Medicare IM given:   Date Additional Medicare IM given:    Discharge Disposition:  HOME W HOME HEALTH SERVICES  Per UR Regulation:  Reviewed for med. necessity/level of care/duration of stay  If discussed at Long Length of Stay Meetings, dates discussed:    Comments:  06/16/13 08:55 CM spoke with pt and gave choice.  Pt was happy with [facility] after a previous hospitilization and requested [facility].  Pt states she has a rolling walker at home. Referral made to [facility]  for HHPT/OT and order 3n1 placed.  No other CM needs communicated .  Freddy Jaksch, BSN, [place] [phone]   06/13/13 JULIE AMERSON,RN,BSN [phone] PHYSICAL THERAPY RECOMMENDING HHPT AT DC WITH 24H ASSISTANCE AT DC.  MD, PLEASE LEAVE HH ORDERS FOR FOLLOW UP.  THANKS.

## 2013-06-21 NOTE — Discharge Summary (Signed)
[facility] Discharge Summary   Patient ID:  [name] MRN: [id number] DOB/AGE: [birth year] 67 y.o.  Admit date: 06/11/2013 Discharge date: 06/21/2013 Date of Surgery: 06/11/2013 Surgeon: Surgeon(s): Sherren Kerns, MD  Admission Diagnosis: Peripheral Vascular Disease  Discharge Diagnoses:  Peripheral Vascular Disease  Secondary Diagnoses: Past Medical History  Diagnosis Date  . Diabetes mellitus     on Lantus 30 U   . CAD (coronary artery disease)     S/p CABG and stenting  . PAD (peripheral artery disease)   . Stroke     MRI 11/2011 with remote occipital lobe. MRA with moderate left focal vertebral artery stenosis  . CHF (congestive heart failure) 11/2011    Echo with EF 30-35%, global hypokinesis, and inferior akinesis  . Hyperlipidemia   . DDD (degenerative disc disease), lumbar   . CVA (cerebral vascular accident) 11/2010  . MI (myocardial infarction) 1997  . COPD (chronic obstructive pulmonary disease)   . Anemia   . Irregular heart beat   . Hypertension   . GERD (gastroesophageal reflux disease)   . History of IBS   . Hypothyroidism     Goiter  . Thyroid disease   . Carotid artery occlusion   . Chronic kidney disease     stage 3    Procedure(s): BYPASS GRAFT  LEFT FEMORAL- POSTERIOR TIBIAL ARTERY/ REDO THROMBECTOMY FEMORAL ARTERY  Discharged Condition: good  HPI: Patient is a 67 y.o. year old female who presents for evaluation of left calf and foot pain. This is worse over the last 2-3 months. The patient previously had a left leg bypass elsewhere several years ago. She was previously seen for evaluation of carotid stenosis a year ago and was scheduled for a carotid arteriogram but decided to cancel her procedure. At that time she was noted to have a decreased ABI in the left leg but was asymptomatic. The patient experiences pain in her left calf with ambulation at short distances. She has continuous pain in her left foot. She has no open  ulcers. Other medical problems include diabetes, coronary disease (prior right leg vein harvest), peripheral arterial disease, prior stroke, COPD, history of renal insufficiency. These are all currently stable. She is on Plavix for antiplatelet therapy which was stopped 9 days ago  She underwent Left external iliac to posterior tibial artery bypass using nonreversed ipsilateral greater saphenous vein by Dr. Darrick Penna 06/11/2013.  She was in the unit for 1 POD.  She could not void independently and was catheterized for 12 hours.   Once patient independently voided she was discharged home.     Hospital Course:  [name] is a 67 y.o. female is S/P Left Procedure(s): BYPASS GRAFT  LEFT FEMORAL- POSTERIOR TIBIAL ARTERY/ REDO THROMBECTOMY FEMORAL ARTERY Extubated: POD # 0 Physical exam: Left lower extremity: warm left foot, still some edema and small amount of serous drainage mid leg  Post-op wounds healing well Pt. Ambulating, voiding and taking PO diet without difficulty. Pt pain controlled with PO pain meds. Labs as below Complications:none  Consults:     Significant Diagnostic Studies: CBC Lab Results  Component Value Date   WBC 8.0 06/12/2013   HGB 10.1* 06/12/2013   HCT 29.6* 06/12/2013   MCV 86.5 06/12/2013   PLT 138* 06/12/2013    BMET    Component Value Date/Time   NA 140 06/12/2013 0450   K 4.5 06/12/2013 0450   CL 109 06/12/2013 0450   CO2 22 06/12/2013 0450   GLUCOSE  156* 06/12/2013 0450   BUN 27* 06/12/2013 0450   CREATININE 1.23* 06/12/2013 0450   CREATININE 0.89 02/18/2012 0800   CALCIUM 7.9* 06/12/2013 0450   GFRNONAA 44* 06/12/2013 0450   GFRAA 51* 06/12/2013 0450   COAG Lab Results  Component Value Date   INR 1.09 06/03/2013   INR 1.06 12/15/2011     Disposition:  Discharge to :Home Discharge Orders   Future Appointments Provider Department Dept Phone   07/04/2013 8:45 AM Sherren Kerns, MD [facility] [phone]   Future Orders  Complete By Expires   Call MD for:  redness, tenderness, or signs of infection (pain, swelling, bleeding, redness, odor or green/yellow discharge around incision site)  As directed    Call MD for:  severe or increased pain, loss or decreased feeling  in affected limb(s)  As directed    Call MD for:  temperature >100.5  As directed    Discharge instructions  As directed    Comments:     Call [facility] to schedule appt 2-3 week follow up with Dr Darrick Penna [phone]   Driving Restrictions  As directed    Comments:     No driving for 2 weeks   Lifting restrictions  As directed    Comments:     No lifting for 4 weeks   Resume previous diet  As directed        Medication List         aspirin EC 81 MG tablet  Take 81 mg by mouth daily.     carvedilol 25 MG tablet  Commonly known as:  COREG  Take 25 mg by mouth 2 (two) times daily with a meal.     clopidogrel 75 MG tablet  Commonly known as:  PLAVIX  Take 75 mg by mouth daily.     furosemide 20 MG tablet  Commonly known as:  LASIX  Take 20 mg by mouth 2 (two) times daily.     insulin aspart 100 UNIT/ML injection  Commonly known as:  novoLOG  Inject 2-10 Units into the skin 3 (three) times daily before meals. Sliding scale as directed     insulin glargine 100 UNIT/ML injection  Commonly known as:  LANTUS  Inject 22 Units into the skin at bedtime.     ipratropium-albuterol 0.5-2.5 (3) MG/3ML Soln  Commonly known as:  DUONEB  Take 3 mLs by nebulization 3 (three) times daily.     irbesartan 300 MG tablet  Commonly known as:  AVAPRO  Take 1 tablet (300 mg total) by mouth at bedtime.     multivitamin with minerals Tabs tablet  Take 1 tablet by mouth daily.     nitroGLYCERIN 0.4 MG SL tablet  Commonly known as:  NITROSTAT  Place 0.4 mg under the tongue every 5 (five) minutes as needed. For chest pain     simvastatin 40 MG tablet  Commonly known as:  ZOCOR  Take 40 mg by mouth every evening.     traMADol 50 MG tablet  Commonly  known as:  ULTRAM  Take 1 tablet (50 mg total) by mouth every 6 (six) hours as needed.     traMADol 50 MG tablet  Commonly known as:  ULTRAM  Take 50 mg by mouth every 6 (six) hours as needed for pain.       Verbal and written Discharge instructions given to the patient. Wound care per Discharge AVS     Follow-up Information   Follow  up with [facility]. (Home health physical therapy and occupational therapy)    Contact information:   [address] [place] [address] [phone]       Signed: Clinton Gallant [facility] 06/21/2013, 11:37 AM  - For VQI Registry use --- Instructions: Press F2 to tab through selections.  Delete question if not applicable.   Post-op:  Wound infection: No  Graft infection: No  Transfusion: No  If yes, 0 units given New Arrhythmia: No Ipsilateral amputation: [x ] no, [ ]  Minor, [ ]  BKA, [ ]  AKA  Discharge patency: [x ] Primary, [ ]  Primary assisted, [ ]  Secondary, [ ]  Occluded (Left external iliac to posterior tibial artery bypass using nonreversed ipsilateral greater saphenous vein RE-DO)   Patency judged by: [x ] Dopper only, [ ]  Palpable graft pulse, [ ]  Palpable distal pulse, [ ]  ABI inc. > 0.15, [ ]  Duplex Discharge ABI: R 0.7, L 0.57 D/C Ambulatory Status: Ambulatory  Complications: MI: [x ] No, [ ]  Troponin only, [ ]  EKG or Clinical CHF: No Resp failure: [x ] none, [ ]  Pneumonia, [ ]  Ventilator Chg in renal function: [x ] none, [ ]  Inc. Cr > 0.5, [ ]  Temp. Dialysis, [ ]  Permanent dialysis Stroke: [x ] None, [ ]  Minor, [ ]  Major Return to OR: No  Reason for return to OR: [ ]  Bleeding, [ ]  Infection, [ ]  Thrombosis, [ ]  Revision  Discharge medications: Statin use:  Yes ASA use:  Yes Plavix use:  Yes Beta blocker use: No  for medical reason   Coumadin use: No  for medical reason

## 2013-07-03 ENCOUNTER — Encounter: Payer: Self-pay | Admitting: Vascular Surgery

## 2013-07-04 ENCOUNTER — Ambulatory Visit (INDEPENDENT_AMBULATORY_CARE_PROVIDER_SITE_OTHER): Payer: Self-pay | Admitting: Vascular Surgery

## 2013-07-04 ENCOUNTER — Encounter: Payer: Self-pay | Admitting: Vascular Surgery

## 2013-07-04 VITALS — BP 172/57 | HR 75 | Temp 98.0°F | Ht 66.0 in | Wt 167.4 lb

## 2013-07-04 DIAGNOSIS — I739 Peripheral vascular disease, unspecified: Secondary | ICD-10-CM

## 2013-07-04 NOTE — Progress Notes (Signed)
Patient is a 67 year old female returns for postoperative followup today. She recently underwent left external iliac to posterior tibial artery bypass using ipsilateral greater saphenous vein. Her rest pain in the left foot is now completely resolved. She states that overall she feels her incisions are healing. She does have some edema in the left leg.  Physical exam:  Filed Vitals:   07/04/13 0844  BP: 172/57  Pulse: 75  Temp: 98 F (36.7 C)  TempSrc: Oral  Height: 5\' 6"  (1.676 m)  Weight: 167 lb 6.4 oz (75.932 kg)  SpO2: 98%    Left lower extremity: Slight separation of the upper portion of left groin incision less than 1 mm depth, some skin edge necrosis mid thigh deep to edema. Left foot pink warm well perfused with brisk posterior tibial Doppler signal  Assessment: Healing left lower extremity with expected edema since this was a redo operation. Staples were removed from the thigh today. She may require some local wound care for the skin edge necrosis we will see how this heals. She'll return in 2 weeks' time for consideration of removal of the sutures in her lower calf.  Plan: See above  Fabienne Bruns, MD [facility] [place] Office: [phone] Pager: [phone]

## 2013-07-10 ENCOUNTER — Encounter: Payer: Self-pay | Admitting: Cardiology

## 2013-07-16 ENCOUNTER — Telehealth: Payer: Self-pay

## 2013-07-16 NOTE — Telephone Encounter (Signed)
[facility] called to report change in the appearance of the left medial, lower leg, proximal to the ankle.  Stated the periwound has a dry, crusty appearance with small amt. of yellow, pus-like drainage.   Stated there is no odor noted.  Reports that pt. has had no fever/ chills.  Pt. denies pain.  Reports sutures and staples are intact to left lower leg.   Stated pt. is doing her own incision care.  Phone call to pt.  Stated "the skin is peeling and appears dry." Denies any redness or inflammation.   Questioned pt. How she is cleaning the incisions; pt. Reported she is cleaning the areas with betadine.  Advised against using betadine at this time, due to the dry, peeling skin.  Enc. To wash incisional areas with dial soap and rinse well.  Pt. Stated she is unable to tolerate dial soap, and has been using dove soap.  Stated the swelling in the left lower leg has decreased quite a bit.  Pt. stated she doesn't feel there has been much change in the left lower leg, and feels she can wait until 9/25 to see Dr. Darrick Penna.  Pt. knows to call office if worsening of symptoms.

## 2013-07-17 ENCOUNTER — Encounter: Payer: Self-pay | Admitting: Vascular Surgery

## 2013-07-18 ENCOUNTER — Ambulatory Visit (INDEPENDENT_AMBULATORY_CARE_PROVIDER_SITE_OTHER): Payer: Self-pay | Admitting: Vascular Surgery

## 2013-07-18 ENCOUNTER — Encounter: Payer: Self-pay | Admitting: Vascular Surgery

## 2013-07-18 VITALS — BP 184/63 | HR 81 | Resp 16 | Ht 66.0 in | Wt 167.0 lb

## 2013-07-18 DIAGNOSIS — I739 Peripheral vascular disease, unspecified: Secondary | ICD-10-CM

## 2013-07-18 DIAGNOSIS — Z4802 Encounter for removal of sutures: Secondary | ICD-10-CM

## 2013-07-18 NOTE — Progress Notes (Signed)
Patient is a 67 year old female returns for postoperative followup today. She recently underwent left external iliac to posterior tibial artery bypass using ipsilateral greater saphenous vein on 06/11/2013. Her rest pain in the left foot is now completely resolved. She states that overall she feels her incisions are healing. She does still have some edema in the left leg.  Physical exam:     Filed Vitals:   07/18/13 1544  BP: 184/63  Pulse: 81  Resp: 16  Height: 5\' 6"  (1.676 m)  Weight: 167 lb (75.751 kg)  SpO2: 98%    Left lower extremity: 3 x 2 area left groin with good granulation tissue less than a millimeter in depth, . Left foot pink warm well perfused with brisk posterior tibial Doppler signal  Assessment: Healing left lower extremity with expected edema since this was a redo operation. Remainder of sutures were removed from the left lower extremity today. Patient was instructed on how to place hydrogel to the left groin wound.  Plan: Followup 3-4 weeks to recheck left groin wound. Patient would be a duplex scan in November of 2014.  Fabienne Bruns, MD [facility] [place] Office: [phone] Pager: [phone]

## 2013-08-07 ENCOUNTER — Telehealth: Payer: Self-pay | Admitting: Cardiology

## 2013-08-07 ENCOUNTER — Encounter: Payer: Self-pay | Admitting: Vascular Surgery

## 2013-08-07 NOTE — Telephone Encounter (Signed)
New Problem  Please contact Dr. Ethelene Hal about Plavix.. Pt will receive an injection in her back and will need to stop taking this medication. please advise.   Dr. Ethelene Hal office [phone]

## 2013-08-08 ENCOUNTER — Ambulatory Visit (INDEPENDENT_AMBULATORY_CARE_PROVIDER_SITE_OTHER): Payer: Self-pay | Admitting: Vascular Surgery

## 2013-08-08 ENCOUNTER — Encounter: Payer: Self-pay | Admitting: Vascular Surgery

## 2013-08-08 VITALS — BP 182/72 | HR 71 | Ht 66.0 in | Wt 171.0 lb

## 2013-08-08 DIAGNOSIS — I739 Peripheral vascular disease, unspecified: Secondary | ICD-10-CM

## 2013-08-08 NOTE — Telephone Encounter (Signed)
Called Dr. Ethelene Hal office, waiting on a call back regarding when and how long the patient will need to hold Plavix.

## 2013-08-08 NOTE — Progress Notes (Signed)
Patient is a 67 year old female returns for postoperative followup today. She recently underwent left external iliac to posterior tibial artery bypass using ipsilateral greater saphenous vein on 06/11/2013. Her rest pain in the left foot is now completely resolved. She states that overall she feels her incisions are healing. She does still have some edema in the left leg but overall this is improved.  Physical exam:       Filed Vitals:   08/08/13 1554  BP: 182/72  Pulse: 71  Height: 5\' 6"  (1.676 m)  Weight: 171 lb (77.565 kg)  SpO2: 98%    Left lower extremity: Left groin completely healed Left foot pink warm well perfused with brisk posterior tibial Doppler signal  Assessment: Healing left lower extremity with expected edema since this was a redo operation. Remainder of sutures were removed from the left lower extremity today.   Plan: Patient would need a duplex scan in November of 2014.  Fabienne Bruns, MD [facility] [place] Office: [phone] Pager: [phone]

## 2013-08-09 NOTE — Addendum Note (Signed)
Addended by: Sharee Pimple on: 08/09/2013 08:04 AM   Modules accepted: Orders

## 2013-08-09 NOTE — Telephone Encounter (Signed)
Procedure  Lumbar Steroid Injection on 08/21/2013, needs to hold Plavix 5 days prior.   Contact :[phone]  Please advise if ok and I will call Dr. Ethelene Hal.

## 2013-08-10 NOTE — Telephone Encounter (Signed)
Dr. Darrick Penna,  Recent left external iliac to posterior tibial artery bypass using ipsilateral greater saphenous vein on 06/11/2013. Dr. Ethelene Hal wants to hold Plavix 5 days prior to steriod injection 10/29. Is this OK with you?

## 2013-08-11 ENCOUNTER — Emergency Department (HOSPITAL_COMMUNITY): Payer: Medicare Other

## 2013-08-11 ENCOUNTER — Encounter (HOSPITAL_COMMUNITY): Payer: Self-pay | Admitting: Emergency Medicine

## 2013-08-11 ENCOUNTER — Emergency Department (HOSPITAL_COMMUNITY)
Admission: EM | Admit: 2013-08-11 | Discharge: 2013-08-11 | Disposition: A | Discharge status: Home / Self Care | Payer: Medicare Other | Attending: Emergency Medicine | Admitting: Emergency Medicine

## 2013-08-11 DIAGNOSIS — Z7982 Long term (current) use of aspirin: Secondary | ICD-10-CM | POA: Insufficient documentation

## 2013-08-11 DIAGNOSIS — I252 Old myocardial infarction: Secondary | ICD-10-CM | POA: Insufficient documentation

## 2013-08-11 DIAGNOSIS — Z862 Personal history of diseases of the blood and blood-forming organs and certain disorders involving the immune mechanism: Secondary | ICD-10-CM | POA: Insufficient documentation

## 2013-08-11 DIAGNOSIS — Z7902 Long term (current) use of antithrombotics/antiplatelets: Secondary | ICD-10-CM | POA: Insufficient documentation

## 2013-08-11 DIAGNOSIS — I509 Heart failure, unspecified: Secondary | ICD-10-CM | POA: Insufficient documentation

## 2013-08-11 DIAGNOSIS — Z79899 Other long term (current) drug therapy: Secondary | ICD-10-CM | POA: Insufficient documentation

## 2013-08-11 DIAGNOSIS — N183 Chronic kidney disease, stage 3 unspecified: Secondary | ICD-10-CM | POA: Insufficient documentation

## 2013-08-11 DIAGNOSIS — Z8719 Personal history of other diseases of the digestive system: Secondary | ICD-10-CM | POA: Insufficient documentation

## 2013-08-11 DIAGNOSIS — R5381 Other malaise: Secondary | ICD-10-CM | POA: Insufficient documentation

## 2013-08-11 DIAGNOSIS — Z794 Long term (current) use of insulin: Secondary | ICD-10-CM | POA: Insufficient documentation

## 2013-08-11 DIAGNOSIS — Z87891 Personal history of nicotine dependence: Secondary | ICD-10-CM | POA: Insufficient documentation

## 2013-08-11 DIAGNOSIS — I129 Hypertensive chronic kidney disease with stage 1 through stage 4 chronic kidney disease, or unspecified chronic kidney disease: Secondary | ICD-10-CM | POA: Insufficient documentation

## 2013-08-11 DIAGNOSIS — R0989 Other specified symptoms and signs involving the circulatory and respiratory systems: Secondary | ICD-10-CM | POA: Insufficient documentation

## 2013-08-11 DIAGNOSIS — E785 Hyperlipidemia, unspecified: Secondary | ICD-10-CM | POA: Insufficient documentation

## 2013-08-11 DIAGNOSIS — Z951 Presence of aortocoronary bypass graft: Secondary | ICD-10-CM | POA: Insufficient documentation

## 2013-08-11 DIAGNOSIS — R05 Cough: Secondary | ICD-10-CM

## 2013-08-11 DIAGNOSIS — Z8673 Personal history of transient ischemic attack (TIA), and cerebral infarction without residual deficits: Secondary | ICD-10-CM | POA: Insufficient documentation

## 2013-08-11 DIAGNOSIS — E119 Type 2 diabetes mellitus without complications: Secondary | ICD-10-CM | POA: Insufficient documentation

## 2013-08-11 DIAGNOSIS — R0600 Dyspnea, unspecified: Secondary | ICD-10-CM

## 2013-08-11 DIAGNOSIS — I2510 Atherosclerotic heart disease of native coronary artery without angina pectoris: Secondary | ICD-10-CM | POA: Insufficient documentation

## 2013-08-11 DIAGNOSIS — R634 Abnormal weight loss: Secondary | ICD-10-CM | POA: Insufficient documentation

## 2013-08-11 DIAGNOSIS — J441 Chronic obstructive pulmonary disease with (acute) exacerbation: Secondary | ICD-10-CM | POA: Insufficient documentation

## 2013-08-11 DIAGNOSIS — J3489 Other specified disorders of nose and nasal sinuses: Secondary | ICD-10-CM | POA: Insufficient documentation

## 2013-08-11 DIAGNOSIS — R0609 Other forms of dyspnea: Secondary | ICD-10-CM | POA: Insufficient documentation

## 2013-08-11 DIAGNOSIS — Z8739 Personal history of other diseases of the musculoskeletal system and connective tissue: Secondary | ICD-10-CM | POA: Insufficient documentation

## 2013-08-11 LAB — PRO B NATRIURETIC PEPTIDE: Pro B Natriuretic peptide (BNP): 4146 pg/mL — ABNORMAL HIGH (ref 0–125)

## 2013-08-11 LAB — COMPREHENSIVE METABOLIC PANEL
ALT: 19 U/L (ref 0–35)
Albumin: 3.3 g/dL — ABNORMAL LOW (ref 3.5–5.2)
BUN: 18 mg/dL (ref 6–23)
Calcium: 8.8 mg/dL (ref 8.4–10.5)
GFR calc Af Amer: 63 mL/min — ABNORMAL LOW (ref >90)
Glucose, Bld: 208 mg/dL — ABNORMAL HIGH (ref 70–99)
Potassium: 4 mEq/L (ref 3.5–5.1)
Sodium: 140 mEq/L (ref 135–145)
Total Protein: 7.1 g/dL (ref 6.0–8.3)

## 2013-08-11 LAB — CBC WITH DIFFERENTIAL/PLATELET
Basophils Absolute: 0 10*3/uL (ref 0.0–0.1)
Basophils Relative: 0 % (ref 0–1)
Eosinophils Absolute: 0.4 10*3/uL (ref 0.0–0.7)
Hemoglobin: 9.2 g/dL — ABNORMAL LOW (ref 12.0–15.0)
MCH: 29.5 pg (ref 26.0–34.0)
MCHC: 32.6 g/dL (ref 30.0–36.0)
Monocytes Relative: 9 % (ref 3–12)
Neutro Abs: 5 10*3/uL (ref 1.7–7.7)
Neutrophils Relative %: 74 % (ref 43–77)
RDW: 16.1 % — ABNORMAL HIGH (ref 11.5–15.5)

## 2013-08-11 MED ORDER — IPRATROPIUM BROMIDE 0.02 % IN SOLN
0.5000 mg | Freq: Once | RESPIRATORY_TRACT | Status: AC
  Administered 2013-08-11: 0.5 mg via RESPIRATORY_TRACT
  Filled 2013-08-11: qty 2.5

## 2013-08-11 MED ORDER — NITROGLYCERIN 0.4 MG SL SUBL: 0.4000 mg | SUBLINGUAL_TABLET | SUBLINGUAL | Status: DC | PRN

## 2013-08-11 MED ORDER — ALBUTEROL SULFATE (5 MG/ML) 0.5% IN NEBU
5.0000 mg | INHALATION_SOLUTION | Freq: Once | RESPIRATORY_TRACT | Status: AC
  Administered 2013-08-11: 5 mg via RESPIRATORY_TRACT
  Filled 2013-08-11: qty 1

## 2013-08-11 MED ORDER — METHYLPREDNISOLONE SODIUM SUCC 125 MG IJ SOLR
80.0000 mg | Freq: Once | INTRAMUSCULAR | Status: AC
  Administered 2013-08-11: 80 mg via INTRAVENOUS
  Filled 2013-08-11: qty 2

## 2013-08-11 MED ORDER — FUROSEMIDE 10 MG/ML IJ SOLN
40.0000 mg | Freq: Once | INTRAMUSCULAR | Status: AC
  Administered 2013-08-11: 40 mg via INTRAVENOUS
  Filled 2013-08-11: qty 4

## 2013-08-11 MED ORDER — FUROSEMIDE 40 MG PO TABS: 40.0000 mg | ORAL_TABLET | Freq: Two times a day (BID) | ORAL | Status: DC

## 2013-08-11 MED ORDER — GUAIFENESIN-CODEINE 100-10 MG/5ML PO SOLN: 5.0000 mL | Freq: Three times a day (TID) | ORAL | Status: DC | PRN

## 2013-08-11 NOTE — ED Provider Notes (Signed)
CSN: [id number]     Arrival date & time 08/11/13  0700 History   First MD Initiated Contact with Patient 08/11/13 [phone]     Chief Complaint  Patient presents with  . Shortness of Breath   (Consider location/radiation/quality/duration/timing/severity/associated sxs/prior Treatment) HPI Comments: 67 yo female with MI, CAD, CHF, Bypass, PAD, Left leg bypass two months ago, HTN, Renal failure, 4 L home O2 at night and prn presents with dyspnea gradually worsening for two days, 3-4 lbs wt gain.  Worse with lying flat.  Pt feels she cannot cough her mucous out, she feels chest has mild pressure with coughing.  Similar to chf hx but she feels her chest is  More congested.  No pain or radiation.  Trach collar for one year.  PVD surgery 2 months ago.  Denies new leg swelling or dvt/ pe hx, no CA hx.    Patient is a 67 y.o. female presenting with shortness of breath. The history is provided by the patient.  Shortness of Breath Severity:  Moderate Associated symptoms: cough   Associated symptoms: no abdominal pain, no chest pain, no fever, no headaches, no neck pain, no rash and no vomiting     Past Medical History  Diagnosis Date  . Diabetes mellitus     on Lantus 30 U   . CAD (coronary artery disease)     S/p CABG and stenting  . PAD (peripheral artery disease)   . Stroke     MRI 11/2011 with remote occipital lobe. MRA with moderate left focal vertebral artery stenosis  . CHF (congestive heart failure) 11/2011    Echo with EF 30-35%, global hypokinesis, and inferior akinesis  . Hyperlipidemia   . DDD (degenerative disc disease), lumbar   . CVA (cerebral vascular accident) 11/2010  . MI (myocardial infarction) 1997  . COPD (chronic obstructive pulmonary disease)   . Anemia   . Irregular heart beat   . Hypertension   . GERD (gastroesophageal reflux disease)   . History of IBS   . Hypothyroidism     Goiter  . Thyroid disease   . Carotid artery occlusion   . Chronic kidney disease    stage 3   Past Surgical History  Procedure Laterality Date  . Ptca    . Thyroidectomy    . Coronary artery bypass graft      2 vessel  . Carotid endarterectomy  ~2008    Left   . Cholecystectomy    . Tracheostomy tube placement  01/02/2012  . Angioplasty  [id number]    Aortogram by Dr. Italy McKenzie [facility])  . Pr vein bypass graft,aorto-fem-pop      Right common femoral-AK popliteal BPG & Right Popliteal-posterior tibial  . Pr vein bypass graft,aorto-fem-pop      Left Fem-pop BPG  . Carpal tunnel release Right   . Femoral-tibial bypass graft Left 06/11/2013    Procedure: BYPASS GRAFT  LEFT FEMORAL- POSTERIOR TIBIAL ARTERY/ REDO;  Surgeon: Sherren Kerns, MD;  Location: [facility] OR;  Service: Vascular;  Laterality: Left;  . Thrombectomy femoral artery Left 06/11/2013    Procedure: THROMBECTOMY FEMORAL ARTERY;  Surgeon: Sherren Kerns, MD;  Location: [facility] OR;  Service: Vascular;  Laterality: Left;   Family History  Problem Relation Age of Onset  . Hyperlipidemia Mother   . Other Mother     AAA  . Alzheimer's disease Mother   . Heart disease Mother   . Irregular heart beat Mother   . Diabetes Daughter   .  Hypertension Daughter    History  Substance Use Topics  . Smoking status: Former Smoker -- 1.00 packs/day for 50 years    Types: Cigarettes    Quit date: 01/24/2011  . Smokeless tobacco: Never Used  . Alcohol Use: No   OB History   Grav Para Term Preterm Abortions TAB SAB Ect Mult Living                 Review of Systems  Constitutional: Positive for fatigue and unexpected weight change. Negative for fever and chills.  HENT: Positive for congestion.   Eyes: Negative for visual disturbance.  Respiratory: Positive for cough and shortness of breath.   Cardiovascular: Negative for chest pain and leg swelling.  Gastrointestinal: Negative for vomiting and abdominal pain.  Genitourinary: Negative for dysuria and flank pain.  Musculoskeletal: Negative for neck pain and  neck stiffness.  Skin: Negative for rash.  Neurological: Negative for syncope, light-headedness and headaches.    Allergies  Aldactone; Crestor; Lisinopril; and Vicodin  Home Medications   Current Outpatient Rx  Name  Route  Sig  Dispense  Refill  . aspirin EC 81 MG tablet   Oral   Take 81 mg by mouth daily.         . carvedilol (COREG) 25 MG tablet   Oral   Take 25 mg by mouth 2 (two) times daily with a meal.         . clopidogrel (PLAVIX) 75 MG tablet   Oral   Take 75 mg by mouth daily.         . furosemide (LASIX) 20 MG tablet   Oral   Take 20 mg by mouth 2 (two) times daily.         Marland Kitchen HYDROcodone-acetaminophen (NORCO) 10-325 MG per tablet   Oral   Take 1 tablet by mouth every 8 (eight) hours as needed for pain.         Marland Kitchen insulin aspart (NOVOLOG FLEXPEN) 100 UNIT/ML SOPN FlexPen   Subcutaneous   Inject 1-12 Units into the skin 3 (three) times daily with meals. As needed per sliding scale         . insulin glargine (LANTUS) 100 UNIT/ML injection   Subcutaneous   Inject 19 Units into the skin at bedtime.          Marland Kitchen ipratropium-albuterol (DUONEB) 0.5-2.5 (3) MG/3ML SOLN   Nebulization   Take 3 mLs by nebulization 3 (three) times daily.         . irbesartan (AVAPRO) 300 MG tablet   Oral   Take 1 tablet (300 mg total) by mouth at bedtime.      0   . LORazepam (ATIVAN) 0.5 MG tablet   Oral   Take 0.5 mg by mouth at bedtime.         . Multiple Vitamin (MULTIVITAMIN WITH MINERALS) TABS   Oral   Take 1 tablet by mouth daily.         . simvastatin (ZOCOR) 40 MG tablet   Oral   Take 40 mg by mouth every evening.          . traMADol (ULTRAM) 50 MG tablet   Oral   Take 50 mg by mouth every 12 (twelve) hours as needed for pain.          . nitroGLYCERIN (NITROSTAT) 0.4 MG SL tablet   Sublingual   Place 0.4 mg under the tongue every 5 (five) minutes as needed. For chest pain  BP 188/78  Pulse 75  Temp(Src) 97.7 F (36.5 C)  (Oral)  SpO2 100% Physical Exam  Nursing note and vitals reviewed. Constitutional: She is oriented to person, place, and time. She appears well-developed and well-nourished. No distress.  HENT:  Head: Normocephalic and atraumatic.  Eyes: Conjunctivae are normal. Right eye exhibits no discharge. Left eye exhibits no discharge.  Neck: Normal range of motion. Neck supple. No tracheal deviation present.  Cardiovascular: Normal rate and regular rhythm.   No murmur heard. Pulmonary/Chest: She has wheezes (mild end exp). She has rales (mild at bases bilateral).  4 word sentences without being sob  Abdominal: Soft. She exhibits no distension. There is no tenderness. There is no guarding.  Musculoskeletal: She exhibits no edema.  Neurological: She is alert and oriented to person, place, and time.  Skin: Skin is warm. No rash noted.  Psychiatric: She has a normal mood and affect.    ED Course  Procedures (including critical care time) Labs Review Labs Reviewed  CBC WITH DIFFERENTIAL - Abnormal; Notable for the following:    RBC 3.12 (*)    Hemoglobin 9.2 (*)    HCT 28.2 (*)    RDW 16.1 (*)    All other components within normal limits  COMPREHENSIVE METABOLIC PANEL - Abnormal; Notable for the following:    Glucose, Bld 208 (*)    Albumin 3.3 (*)    GFR calc non Af Amer 54 (*)    GFR calc Af Amer 63 (*)    All other components within normal limits  PRO B NATRIURETIC PEPTIDE - Abnormal; Notable for the following:    Pro B Natriuretic peptide (BNP) 4146.0 (*)    All other components within normal limits  TROPONIN I   Imaging Review No results found.  EKG Interpretation   None       MDM  No diagnosis found. Complex cardiac/ lung hx.  Clinically sxs related to inflammation or fluid from copd/ pneumonia or CHF. Nebs, steroids and nitro to assist with sxs. EKG reviewed, no acute findings.  CXR mild pulm edema, reviewed MUSE not working.  Date: 08/11/2013  Rate: 95  Rhythm:  normal sinus rhythm  QRS Axis: normal  Intervals: QT prolonged  ST/T Wave abnormalities: nonspecific ST changes and nonspecific T wave changes  Conduction Disutrbances:none  Narrative Interpretation:  No acute findings  Good urine output in ED.  Pt feels at much improved.  Not requiring O2 on multiple rechecks. Long discussion with daughter and pt, agree with close outpt fup as pt feels improved, as O2 at home for night, not requiring O2 in ED and has hx of similar in the past.   Pt does admit to eating increased salt intake the past week, long discussion on improved dietary intake.  Results and differential diagnosis were discussed with the patient. Close follow up outpatient was discussed, patient comfortable with the plan.   Diagnosis: Acute CHF exac, pulm edema, dyspnea     Enid Skeens, MD 08/11/13 1039

## 2013-08-11 NOTE — ED Notes (Signed)
Pt from home.  EMS called out for c/o SOB when laying down.  EMS reports lungs clear and pt O2 sats 100%.  Pt with trach and on 4L supplemental O2.

## 2013-08-13 NOTE — Telephone Encounter (Signed)
Reviewed with Dr. Darrick Penna. OK with him to hold Plavix for 5 days prior. She had recent vascular surgery. Ok with me then as well.

## 2013-08-19 HISTORY — PX: SPINE SURGERY: SHX786

## 2013-08-20 ENCOUNTER — Other Ambulatory Visit: Payer: Self-pay | Admitting: Internal Medicine

## 2013-08-20 ENCOUNTER — Ambulatory Visit
Admission: RE | Admit: 2013-08-20 | Discharge: 2013-08-20 | Disposition: A | Discharge status: Home / Self Care | Payer: No Typology Code available for payment source | Source: Ambulatory Visit | Attending: Internal Medicine | Admitting: Internal Medicine

## 2013-08-20 DIAGNOSIS — M549 Dorsalgia, unspecified: Secondary | ICD-10-CM

## 2013-08-26 ENCOUNTER — Ambulatory Visit (INDEPENDENT_AMBULATORY_CARE_PROVIDER_SITE_OTHER): Payer: Medicare Other | Admitting: Cardiology

## 2013-08-26 ENCOUNTER — Encounter: Payer: Self-pay | Admitting: Cardiology

## 2013-08-26 VITALS — BP 114/68 | HR 72 | Ht 66.0 in | Wt 168.0 lb

## 2013-08-26 DIAGNOSIS — Z930 Tracheostomy status: Secondary | ICD-10-CM

## 2013-08-26 DIAGNOSIS — J449 Chronic obstructive pulmonary disease, unspecified: Secondary | ICD-10-CM

## 2013-08-26 DIAGNOSIS — I2510 Atherosclerotic heart disease of native coronary artery without angina pectoris: Secondary | ICD-10-CM

## 2013-08-26 DIAGNOSIS — I10 Essential (primary) hypertension: Secondary | ICD-10-CM

## 2013-08-26 DIAGNOSIS — I252 Old myocardial infarction: Secondary | ICD-10-CM

## 2013-08-26 DIAGNOSIS — I739 Peripheral vascular disease, unspecified: Secondary | ICD-10-CM

## 2013-08-26 DIAGNOSIS — Z9889 Other specified postprocedural states: Secondary | ICD-10-CM | POA: Insufficient documentation

## 2013-08-26 HISTORY — DX: Other specified postprocedural states: Z98.890

## 2013-08-26 NOTE — Progress Notes (Signed)
[address] [place], [place]  [address] Phone: [phone] Fax:  [phone]  Date:  08/26/2013   ID:  [name], DOB 04-Nov-1945, MRN [id number]  PCP:  Cala Bradford, MD   History of Present Illness: [name] is a 67 y.o. female  discharged on 01/04/13 with COPD, chronic systolic congestive heart failure with EF of 20-25% as well as emergency department visit on 08/11/13 with shortness of breath that was much improved upon discharge here for followup. Lasix IV helped. Too much salt and fluid prior to ER.  She has tracheostomy, placed by Dr. Jenne Pane. We discussed the potential for Infection risk with ICD placement. EKG demonstrates narrow complex QRS.   Doing fairly well. BNP was 4100. Creat 1.04.   Left LE bypass, Dr. Darrick Penna. Plavix. Recent held Plavix for spine injection.    She still occasionally feeling shortness of breath. She did feel quite significant relief after Lasix was increased in emergency department.    Wt Readings from Last 3 Encounters:  08/26/13 168 lb (76.204 kg)  08/08/13 171 lb (77.565 kg)  07/18/13 167 lb (75.751 kg)     Past Medical History  Diagnosis Date  . Diabetes mellitus     on Lantus 30 U   . CAD (coronary artery disease)     S/p CABG and stenting  . PAD (peripheral artery disease)   . Stroke     MRI 11/2011 with remote occipital lobe. MRA with moderate left focal vertebral artery stenosis  . CHF (congestive heart failure) 11/2011    Echo with EF 30-35%, global hypokinesis, and inferior akinesis  . Hyperlipidemia   . DDD (degenerative disc disease), lumbar   . CVA (cerebral vascular accident) 11/2010  . MI (myocardial infarction) 1997  . COPD (chronic obstructive pulmonary disease)   . Anemia   . Irregular heart beat   . Hypertension   . GERD (gastroesophageal reflux disease)   . History of IBS   . Hypothyroidism     Goiter  . Thyroid disease   . Carotid artery occlusion   . Chronic kidney disease    stage 3    Past Surgical History  Procedure Laterality Date  . Ptca    . Thyroidectomy    . Coronary artery bypass graft      2 vessel  . Carotid endarterectomy  ~2008    Left   . Cholecystectomy    . Tracheostomy tube placement  01/02/2012  . Angioplasty  [id number]    Aortogram by Dr. Italy McKenzie [facility])  . Pr vein bypass graft,aorto-fem-pop      Right common femoral-AK popliteal BPG & Right Popliteal-posterior tibial  . Pr vein bypass graft,aorto-fem-pop      Left Fem-pop BPG  . Carpal tunnel release Right   . Femoral-tibial bypass graft Left 06/11/2013    Procedure: BYPASS GRAFT  LEFT FEMORAL- POSTERIOR TIBIAL ARTERY/ REDO;  Surgeon: Sherren Kerns, MD;  Location: [facility] OR;  Service: Vascular;  Laterality: Left;  . Thrombectomy femoral artery Left 06/11/2013    Procedure: THROMBECTOMY FEMORAL ARTERY;  Surgeon: Sherren Kerns, MD;  Location: [facility] OR;  Service: Vascular;  Laterality: Left;    Current Outpatient Prescriptions  Medication Sig Dispense Refill  . aspirin EC 81 MG tablet Take 81 mg by mouth daily.      . carvedilol (COREG) 25 MG tablet Take 25 mg by mouth 2 (two) times daily with a meal.      .  clopidogrel (PLAVIX) 75 MG tablet Take 75 mg by mouth daily.      . furosemide (LASIX) 40 MG tablet Take 1 tablet (40 mg total) by mouth 2 (two) times daily.  20 tablet  0  . HYDROcodone-acetaminophen (NORCO) 10-325 MG per tablet Take 1 tablet by mouth every 8 (eight) hours as needed for pain.      Marland Kitchen insulin aspart (NOVOLOG FLEXPEN) 100 UNIT/ML SOPN FlexPen Inject 1-12 Units into the skin 3 (three) times daily with meals. As needed per sliding scale      . insulin glargine (LANTUS) 100 UNIT/ML injection Inject 19 Units into the skin at bedtime.       Marland Kitchen ipratropium-albuterol (DUONEB) 0.5-2.5 (3) MG/3ML SOLN Take 3 mLs by nebulization 3 (three) times daily.      . irbesartan (AVAPRO) 300 MG tablet Take 1 tablet (300 mg total) by mouth at bedtime.    0  . LORazepam (ATIVAN)  0.5 MG tablet Take 0.5 mg by mouth at bedtime.      . Multiple Vitamin (MULTIVITAMIN WITH MINERALS) TABS Take 1 tablet by mouth daily.      . nitroGLYCERIN (NITROSTAT) 0.4 MG SL tablet Place 0.4 mg under the tongue every 5 (five) minutes as needed. For chest pain      . simvastatin (ZOCOR) 40 MG tablet Take 40 mg by mouth every evening.       . traMADol (ULTRAM) 50 MG tablet Take 50 mg by mouth every 12 (twelve) hours as needed for pain.       . [DISCONTINUED] dexlansoprazole (DEXILANT) 60 MG capsule Take 60 mg by mouth daily.       No current facility-administered medications for this visit.    Allergies:    Allergies  Allergen Reactions  . Aldactone [Spironolactone]     Severe hyperkalemia   . Crestor [Rosuvastatin Calcium] Other (See Comments)    Muscle Pain  . Lisinopril Other (See Comments)    "Lowers her BP too low" & cough  . Vicodin [Hydrocodone-Acetaminophen] Nausea And Vomiting    Social History:  The patient  reports that she quit smoking about 2 years ago. Her smoking use included Cigarettes. She has a 50 pack-year smoking history. She has never used smokeless tobacco. She reports that she does not drink alcohol or use illicit drugs.   ROS:  Please see the history of present illness.   Denies any lower extremity pain, recent left lower tremor the bypass, no fevers, no chills, no cough, no syncope, no significant orthopnea. Occasional shortness of breath.    PHYSICAL EXAM: VS:  BP 114/68  Pulse 72  Ht 5\' 6"  (1.676 m)  Wt 168 lb (76.204 kg)  BMI 27.13 kg/m2  SpO2 99% Well nourished, well developed, in no acute distress HEENT: Tracheostomy noted midline. Neck: no JVD Cardiac:  normal S1, S2; RRR; no murmur Lungs:  clear to auscultation bilaterally, no wheezing, rhonchi or rales Abd: soft, nontender, no hepatomegaly Ext: trace LE Left edema, left dimishished pulsed,  Skin: warm and dry Neuro: no focal abnormalities noted  Prior emergency department lab work,  records reviewed     ASSESSMENT AND PLAN:  1. Acute on chronic systolic heart failure-EF 25%. Agree with emergency department increase of Lasix. Instead of 20 mg twice a day chronic as she was on previously, I will change her to 40 mg twice a day. Daily weights. Sodium and fluid restriction discussed. We will check a basic metabolic profile in 2 weeks since her  Lasix dose is being adjusted. I will see her back in clinic. Appreciate emergency department assistance. 2. Status post tracheostomy-Dr. Kevan Ny. 3. Chronic anemia-mild. 4. Hypertension-currently well controlled. 5. Peripheral vascular disease-as described above. Recent left lower extremity bypass. 6. COPD-could also be playing a role in her shortness of breath.  Signed, Donato Schultz, MD [facility]  08/26/2013 9:37 AM

## 2013-08-26 NOTE — Patient Instructions (Addendum)
Your physician recommends that you schedule a follow-up appointment in: 2 WEEKS WITH DR. Anne Fu (September 11, 2013 AT 12:00 NOON)  Your physician has recommended you make the following change in your medication:   MAKE SURE YOU ARE TAKING YOUR LASIX 40 MG TWICE A DAY (TWELVE HOURS APART)  Your physician recommends that you return for lab work in: WE WILL CHECK YOUR BMET(KIDNEY) IN 2 WEEKS WHEN YOU COME IN FOR YOUR OFFICE VISIT.  Your physician recommends that you continue on your current medications as directed. Please refer to the Current Medication list given to you today.

## 2013-08-28 ENCOUNTER — Telehealth: Payer: Self-pay | Admitting: Cardiology

## 2013-08-28 ENCOUNTER — Encounter: Payer: Self-pay | Admitting: *Deleted

## 2013-08-28 NOTE — Telephone Encounter (Signed)
Refaxed information to [place] at [facility] [phone]). Note was found in the [place] documentation system dated 07/04/13

## 2013-08-28 NOTE — Telephone Encounter (Signed)
New problem    [place] [employer] rep.  With [facility] called to qualify pt for oxygen per Medicare guideline.  [facility] need pt's Saturation levels and need for Oxygen.  These are not in her chart.   Pt has been notified.   [facility] would like a call back.   Thank you!

## 2013-08-28 NOTE — Telephone Encounter (Signed)
[place]/Oxygen Rep with [facility] [phone]) called inquiring about need for information to be provided so patient can qualify for continued home oxygen use. She needs Saturation levels and documented need for home oxygen use. States patient said Dr. Anne Fu follows her regarding oxygen use. Patient last saw Dr. Sandrea Hughs, [facility], in February, 2013.  Will route to Dr. Estil Daft for follow up or referral back to Dr. Chiquita Loth.

## 2013-08-28 NOTE — Telephone Encounter (Signed)
This was done on 07/04/13. In [facility]. Office visit.

## 2013-08-29 ENCOUNTER — Other Ambulatory Visit: Payer: Self-pay | Admitting: Vascular Surgery

## 2013-08-29 DIAGNOSIS — I739 Peripheral vascular disease, unspecified: Secondary | ICD-10-CM

## 2013-08-29 DIAGNOSIS — Z48812 Encounter for surgical aftercare following surgery on the circulatory system: Secondary | ICD-10-CM

## 2013-09-04 ENCOUNTER — Encounter: Payer: Self-pay | Admitting: Family

## 2013-09-05 ENCOUNTER — Ambulatory Visit (HOSPITAL_COMMUNITY)
Admission: RE | Admit: 2013-09-05 | Discharge: 2013-09-05 | Disposition: A | Discharge status: Home / Self Care | Payer: Medicare Other | Source: Ambulatory Visit | Attending: Vascular Surgery | Admitting: Vascular Surgery

## 2013-09-05 ENCOUNTER — Ambulatory Visit (INDEPENDENT_AMBULATORY_CARE_PROVIDER_SITE_OTHER)
Admission: RE | Admit: 2013-09-05 | Discharge: 2013-09-05 | Disposition: A | Discharge status: Home / Self Care | Payer: Medicare Other | Source: Ambulatory Visit | Attending: Family | Admitting: Family

## 2013-09-05 ENCOUNTER — Ambulatory Visit (INDEPENDENT_AMBULATORY_CARE_PROVIDER_SITE_OTHER): Payer: Self-pay | Admitting: Family

## 2013-09-05 ENCOUNTER — Encounter: Payer: Self-pay | Admitting: Family

## 2013-09-05 VITALS — BP 168/80 | HR 65 | Resp 16 | Ht 62.0 in | Wt 168.0 lb

## 2013-09-05 DIAGNOSIS — I739 Peripheral vascular disease, unspecified: Secondary | ICD-10-CM

## 2013-09-05 DIAGNOSIS — Z48812 Encounter for surgical aftercare following surgery on the circulatory system: Secondary | ICD-10-CM

## 2013-09-05 DIAGNOSIS — M79609 Pain in unspecified limb: Secondary | ICD-10-CM

## 2013-09-05 NOTE — Patient Instructions (Addendum)
Peripheral Vascular Disease Peripheral Vascular Disease (PVD), also called Peripheral Arterial Disease (PAD), is a circulation problem caused by cholesterol (atherosclerotic plaque) deposits in the arteries. PVD commonly occurs in the lower extremities (legs) but it can occur in other areas of the body, such as your arms. The cholesterol buildup in the arteries reduces blood flow which can cause pain and other serious problems. The presence of PVD can place a person at risk for Coronary Artery Disease (CAD).  CAUSES  Causes of PVD can be many. It is usually associated with more than one risk factor such as:   High Cholesterol.  Smoking.  Diabetes.  Lack of exercise or inactivity.  High blood pressure (hypertension).  Obesity.  Family history. SYMPTOMS   When the lower extremities are affected, patients with PVD may experience:  Leg pain with exertion or physical activity. This is called INTERMITTENT CLAUDICATION. This may present as cramping or numbness with physical activity. The location of the pain is associated with the level of blockage. For example, blockage at the abdominal level (distal abdominal aorta) may result in buttock or hip pain. Lower leg arterial blockage may result in calf pain.  As PVD becomes more severe, pain can develop with less physical activity.  In people with severe PVD, leg pain may occur at rest.  Other PVD signs and symptoms:  Leg numbness or weakness.  Coldness in the affected leg or foot, especially when compared to the other leg.  A change in leg color.  Patients with significant PVD are more prone to ulcers or sores on toes, feet or legs. These may take longer to heal or may reoccur. The ulcers or sores can become infected.  If signs and symptoms of PVD are ignored, gangrene may occur. This can result in the loss of toes or loss of an entire limb.  Not all leg pain is related to PVD. Other medical conditions can cause leg pain such  as:  Blood clots (embolism) or Deep Vein Thrombosis.  Inflammation of the blood vessels (vasculitis).  Spinal stenosis. DIAGNOSIS  Diagnosis of PVD can involve several different types of tests. These can include:  Pulse Volume Recording Method (PVR). This test is simple, painless and does not involve the use of X-rays. PVR involves measuring and comparing the blood pressure in the arms and legs. An ABI (Ankle-Brachial Index) is calculated. The normal ratio of blood pressures is 1. As this number becomes smaller, it indicates more severe disease.  < 0.95  indicates significant narrowing in one or more leg vessels.  <0.8 there will usually be pain in the foot, leg or buttock with exercise.  <0.4 will usually have pain in the legs at rest.  <0.25  usually indicates limb threatening PVD.  Doppler detection of pulses in the legs. This test is painless and checks to see if you have a pulses in your legs/feet.  A dye or contrast material (a substance that highlights the blood vessels so they show up on x-ray) may be given to help your caregiver better see the arteries for the following tests. The dye is eliminated from your body by the kidney's. Your caregiver may order blood work to check your kidney function and other laboratory values before the following tests are performed:  Magnetic Resonance Angiography (MRA). An MRA is a picture study of the blood vessels and arteries. The MRA machine uses a large magnet to produce images of the blood vessels.  Computed Tomography Angiography (CTA). A CTA is a   specialized x-ray that looks at how the blood flows in your blood vessels. An IV may be inserted into your arm so contrast dye can be injected.  Angiogram. Is a procedure that uses x-rays to look at your blood vessels. This procedure is minimally invasive, meaning a small incision (cut) is made in your groin. A small tube (catheter) is then inserted into the artery of your groin. The catheter is  guided to the blood vessel or artery your caregiver wants to examine. Contrast dye is injected into the catheter. X-rays are then taken of the blood vessel or artery. After the images are obtained, the catheter is taken out. TREATMENT  Treatment of PVD involves many interventions which may include:  Lifestyle changes:  Quitting smoking.  Exercise.  Following a low fat, low cholesterol diet.  Control of diabetes.  Foot care is very important to the PVD patient. Good foot care can help prevent infection.  Medication:  Cholesterol-lowering medicine.  Blood pressure medicine.  Anti-platelet drugs.  Certain medicines may reduce symptoms of Intermittent Claudication.  Interventional/Surgical options:  Angioplasty. An Angioplasty is a procedure that inflates a balloon in the blocked artery. This opens the blocked artery to improve blood flow.  Stent Implant. A wire mesh tube (stent) is placed in the artery. The stent expands and stays in place, allowing the artery to remain open.  Peripheral Bypass Surgery. This is a surgical procedure that reroutes the blood around a blocked artery to help improve blood flow. This type of procedure may be performed if Angioplasty or stent implants are not an option. SEEK IMMEDIATE MEDICAL CARE IF:   You develop pain or numbness in your arms or legs.  Your arm or leg turns cold, becomes blue in color.  You develop redness, warmth, swelling and pain in your arms or legs. MAKE SURE YOU:   Understand these instructions.  Will watch your condition.  Will get help right away if you are not doing well or get worse. Document Released: 11/17/2004 Document Revised: 01/02/2012 Document Reviewed: 10/14/2008 [employer] Patient Information 2014 [employer].   Stroke Prevention Some medical conditions and behaviors are associated with an increased chance of having a stroke. You may prevent a stroke by making healthy choices and managing medical  conditions. Reduce your risk of having a stroke by:  Staying physically active. Get at least 30 minutes of activity on most or all days.  Not smoking. It may also be helpful to avoid exposure to secondhand smoke.  Limiting alcohol use. Moderate alcohol use is considered to be:  No more than 2 drinks per day for men.  No more than 1 drink per day for nonpregnant women.  Eating healthy foods.  Include 5 or more servings of fruits and vegetables a day.  Certain diets may be prescribed to address high blood pressure, high cholesterol, diabetes, or obesity.  Managing your cholesterol levels.  A low-saturated fat, low-trans fat, low-cholesterol, and high-fiber diet may control cholesterol levels.  Take any prescribed medicines to control cholesterol as directed by your caregiver.  Managing your diabetes.  A controlled-carbohydrate, controlled-sugar diet is recommended to manage diabetes.  Take any prescribed medicines to control diabetes as directed by your caregiver.  Controlling your high blood pressure (hypertension).  A low-salt (sodium), low-saturated fat, low-trans fat, and low-cholesterol diet is recommended to manage high blood pressure.  Take any prescribed medicines to control hypertension as directed by your caregiver.  Maintaining a healthy weight.  A reduced-calorie, low-sodium, low-saturated fat, low-trans   fat, low-cholesterol diet is recommended to manage weight.  Stopping drug abuse.  Avoiding birth control pills.  Talk to your caregiver about the risks of taking birth control pills if you are over 25 years old, smoke, get migraines, or have ever had a blood clot.  Getting evaluated for sleep disorders (sleep apnea).  Talk to your caregiver about getting a sleep evaluation if you snore a lot or have excessive sleepiness.  Taking medicines as directed by your caregiver.  For some people, aspirin or blood thinners (anticoagulants) are helpful in reducing  the risk of forming abnormal blood clots that can lead to stroke. If you have the irregular heart rhythm of atrial fibrillation, you should be on a blood thinner unless there is a good reason you cannot take them.  Understand all your medicine instructions. SEEK IMMEDIATE MEDICAL CARE IF:   You have sudden weakness or numbness of the face, arm, or leg, especially on one side of the body.  You have sudden confusion.  You have trouble speaking (aphasia) or understanding.  You have sudden trouble seeing in one or both eyes.  You have sudden trouble walking.  You have dizziness.  You have a loss of balance or coordination.  You have a sudden, severe headache with no known cause.  You have new chest pain or an irregular heartbeat. Any of these symptoms may represent a serious problem that is an emergency. Do not wait to see if the symptoms will go away. Get medical help right away. Call your local emergency services (911 in [place] Do not drive yourself to the hospital. Document Released: 11/17/2004 Document Revised: 01/02/2012 Document Reviewed: 04/12/2013 [facility] Patient Information 2014 [place], [place].   Venous Stasis and Chronic Venous Insufficiency As people age, the veins located in their legs may weaken and stretch. When veins weaken and lose the ability to pump blood effectively, the condition is called chronic venous insufficiency (CVI) or venous stasis. Almost all veins return blood back to the heart. This happens by:  The force of the heart pumping fresh blood pushes blood back to the heart.  Blood flowing to the heart from the force of gravity. In the deep veins of the legs, blood has to fight gravity and flow upstream back to the heart. Here, the leg muscles contract to pump blood back toward the heart. Vein walls are elastic, and many veins have small valves that only allow blood to flow in one direction. When leg muscles contract, they push inward against the elastic vein  walls. This squeezes blood upward, opens the valves, and moves blood toward the heart. When leg muscles relax, the vein wall also relaxes and the valves inside the vein close to prevent blood from flowing backward. This method of pumping blood out of the legs is called the venous pump. CAUSES  The venous pump works best while walking and leg muscles are contracting. But when a person sits or stands, blood pressure in leg veins can build. Deep veins are usually able to withstand short periods of inactivity, but long periods of inactivity (and increased pressure) can stretch, weaken, and damage vein walls. High blood pressure can also stretch and damage vein walls. The veins may no longer be able to pump blood back to the heart. Venous hypertension (high blood pressure inside veins) that lasts over time is a primary cause of CVI. CVI can also be caused by:   Deep vein thrombosis, a condition where a thrombus (blood clot) blocks blood flow in  a vein.  Phlebitis, an inflammation of a superficial vein that causes a blood clot to form. Other risk factors for CVI may include:   Heredity.  Obesity.  Pregnancy.  Sedentary lifestyle.  Smoking.  Jobs requiring long periods of standing or sitting in one place.  Age and gender:  Women in their 66's and 23's and men in their 63's are more prone to developing CVI. SYMPTOMS  Symptoms of CVI may include:   Varicose veins.  Ulceration or skin breakdown.  Lipodermatosclerosis, a condition that affects the skin just above the ankle, usually on the inside surface. Over time the skin becomes brown, smooth, tight and often painful. Those with this condition have a high risk of developing skin ulcers.  Reddened or discolored skin on the leg.  Swelling. DIAGNOSIS  Your caregiver can diagnose CVI after performing a careful medical history and physical examination. To confirm the diagnosis, the following tests may also be ordered:   Duplex  ultrasound.  Plethysmography (tests blood flow).  Venograms (x-ray using a special dye). TREATMENT The goals of treatment for CVI are to restore a person to an active life and to minimize pain or disability. Typically, CVI does not pose a serious threat to life or limb, and with proper treatment most people with this condition can continue to lead active lives. In most cases, mild CVI can be treated on an outpatient basis with simple procedures. Treatment methods include:   Elastic compression socks.  Sclerotherapy, a procedure involving an injection of a material that "dissolves" the damaged veins. Other veins in the network of blood vessels take over the function of the damaged veins.  Vein stripping (an older procedure less commonly used).  Laser Ablation surgery.  Valve repair. HOME CARE INSTRUCTIONS   Elastic compression socks must be worn every day. They can help with symptoms and lower the chances of the problem getting worse, but they do not cure the problem.  Only take over-the-counter or prescription medicines for pain, discomfort, or fever as directed by your caregiver.  Your caregiver will review your other medications with you. SEEK MEDICAL CARE IF:   You are confused about how to take your medications.  There is redness, swelling, or increasing pain in the affected area.  There is a red streak or line that extends up or down from the affected area.  There is a breakdown or loss of skin in the affected area, even if the breakdown is small.  You develop an unexplained oral temperature above 102 F (38.9 C).  There is an injury to the affected area. SEEK IMMEDIATE MEDICAL CARE IF:   There is an injury and open wound to the affected area.  Pain is not adequately relieved with pain medication prescribed or becomes severe.  An oral temperature above 102 F (38.9 C) develops.  The foot/ankle below the affected area becomes suddenly numb or the area feels weak and  hard to move. MAKE SURE YOU:   Understand these instructions.  Will watch your condition.  Will get help right away if you are not doing well or get worse. Document Released: 02/13/2007 Document Revised: 01/02/2012 Document Reviewed: 04/23/2007 [facility] Patient Information 2014 [place], [place].

## 2013-09-05 NOTE — Progress Notes (Signed)
[facility] [place] HISTORY AND PHYSICAL -PAD  History of Present Illness [name] is a 67 y.o. female patient of Dr. Darrick Penna who is s/p left external iliac to posterior tibial artery bypass using ipsilateral greater saphenous vein on 06/11/2013; this resolved the rest pain in her left foot. She returns today for LE surveillance. Has lumbar spine issues causing bilateral sciatic pain, is getting ESI's by Dr. Ethelene Hal. Her left foot remains pain free since the vascular BPG with good color, denies rest pain, denies non-healing ulcers. Continues to have LLE swelling, does not wear compression hose. Has a trach.for the past 2 years since she was intubated for pneumonia due to tracheal swelling, managed by her ENT. Has CHF, had MI 10 years ago. Had left CEA 10 years ago, states TIA before that, no TIA sx's since then, has not had carotid Duplex since then, CEA was done in [place].    Patient denies New Medical or Surgical History.   Pt Diabetic: Yes, states in control Pt smoker: former smoker, quit 2 years ago  Pt meds include: Statin :Yes Betablocker: Yes ASA: Yes Other anticoagulants/antiplatelets: Plavix  Past Medical History  Diagnosis Date  . Diabetes mellitus     on Lantus 30 U   . CAD (coronary artery disease)     S/p CABG and stenting  . PAD (peripheral artery disease)   . Stroke     MRI 11/2011 with remote occipital lobe. MRA with moderate left focal vertebral artery stenosis  . CHF (congestive heart failure) 11/2011    Echo with EF 30-35%, global hypokinesis, and inferior akinesis  . Hyperlipidemia   . DDD (degenerative disc disease), lumbar   . CVA (cerebral vascular accident) 11/2010  . MI (myocardial infarction) 1997  . COPD (chronic obstructive pulmonary disease)   . Anemia   . Irregular heart beat   . Hypertension   . GERD (gastroesophageal reflux disease)   . History of IBS   . Hypothyroidism     Goiter  . Thyroid disease   . Carotid artery  occlusion   . Chronic kidney disease     stage 3  . History of tracheostomy 08/26/2013    Dr. Jenne Pane    Social History History  Substance Use Topics  . Smoking status: Former Smoker -- 1.00 packs/day for 50 years    Types: Cigarettes    Quit date: 01/24/2011  . Smokeless tobacco: Never Used  . Alcohol Use: No    Family History Family History  Problem Relation Age of Onset  . Hyperlipidemia Mother   . Other Mother     AAA  . Alzheimer's disease Mother   . Heart disease Mother   . Irregular heart beat Mother   . Diabetes Daughter   . Hypertension Daughter     Past Surgical History  Procedure Laterality Date  . Ptca    . Thyroidectomy    . Coronary artery bypass graft      2 vessel  . Carotid endarterectomy  ~2008    Left   . Cholecystectomy    . Tracheostomy tube placement  01/02/2012  . Angioplasty  [id number]    Aortogram by Dr. [place] [facility])  . Pr vein bypass graft,aorto-fem-pop      Right common femoral-AK popliteal BPG & Right Popliteal-posterior tibial  . Pr vein bypass graft,aorto-fem-pop      Left Fem-pop BPG  . Carpal tunnel release Right   . Femoral-tibial bypass graft Left 06/11/2013  Procedure: BYPASS GRAFT  LEFT FEMORAL- POSTERIOR TIBIAL ARTERY/ REDO;  Surgeon: Sherren Kerns, MD;  Location: [facility] OR;  Service: Vascular;  Laterality: Left;  . Thrombectomy femoral artery Left 06/11/2013    Procedure: THROMBECTOMY FEMORAL ARTERY;  Surgeon: Sherren Kerns, MD;  Location: [facility] OR;  Service: Vascular;  Laterality: Left;    Allergies  Allergen Reactions  . Aldactone [Spironolactone]     Severe hyperkalemia   . Crestor [Rosuvastatin Calcium] Other (See Comments)    Muscle Pain  . Lisinopril Other (See Comments)    "Lowers her BP too low" & cough  . Vicodin [Hydrocodone-Acetaminophen] Nausea And Vomiting    Current Outpatient Prescriptions  Medication Sig Dispense Refill  . aspirin EC 81 MG tablet Take 81 mg by mouth daily.      .  carvedilol (COREG) 25 MG tablet Take 25 mg by mouth 2 (two) times daily with a meal.      . clopidogrel (PLAVIX) 75 MG tablet Take 75 mg by mouth daily.      . furosemide (LASIX) 40 MG tablet Take 1 tablet (40 mg total) by mouth 2 (two) times daily.  20 tablet  0  . HYDROcodone-acetaminophen (NORCO) 10-325 MG per tablet Take 1 tablet by mouth every 8 (eight) hours as needed for pain.      Marland Kitchen insulin aspart (NOVOLOG FLEXPEN) 100 UNIT/ML SOPN FlexPen Inject 1-12 Units into the skin 3 (three) times daily with meals. As needed per sliding scale      . insulin glargine (LANTUS) 100 UNIT/ML injection Inject 19 Units into the skin at bedtime.       Marland Kitchen ipratropium-albuterol (DUONEB) 0.5-2.5 (3) MG/3ML SOLN Take 3 mLs by nebulization 3 (three) times daily.      . irbesartan (AVAPRO) 300 MG tablet Take 1 tablet (300 mg total) by mouth at bedtime.    0  . LORazepam (ATIVAN) 0.5 MG tablet Take 0.5 mg by mouth at bedtime.      . Multiple Vitamin (MULTIVITAMIN WITH MINERALS) TABS Take 1 tablet by mouth daily.      . nitroGLYCERIN (NITROSTAT) 0.4 MG SL tablet Place 0.4 mg under the tongue every 5 (five) minutes as needed. For chest pain      . simvastatin (ZOCOR) 40 MG tablet Take 40 mg by mouth every evening.       . traMADol (ULTRAM) 50 MG tablet Take 50 mg by mouth every 12 (twelve) hours as needed for pain.       . [DISCONTINUED] dexlansoprazole (DEXILANT) 60 MG capsule Take 60 mg by mouth daily.       No current facility-administered medications for this visit.    ROS: [x]  Positive   [ ]  Denies  General:[ ]  Weight loss,  [ ]  Weight gain, [ ]  Fever, [ ]  chills Neurologic: [ ]  Dizziness, [ ]  Blackouts, [ ]  Seizure [ ]  Stroke, [ ]  "Mini stroke", [ ]  Slurred speech, [ ]  Temporary blindness;  [ ] weakness, [ ]  Hoarseness Cardiac: [ ]  Chest pain/pressure, [ ]  Shortness of breath at rest [ ]  Shortness of breath with exertion,  [ ]   Atrial fibrillation or irregular heartbeat Vascular:[ ]  Pain in legs with  walking, [ ]  Pain in legs at rest ,[ ]  Pain in legs at night,  [ ]   Non-healing ulcer, [ ]  Blood clot in vein/DVT,   Pulmonary: [ ]  Home oxygen, [ ]   Productive cough, [ ]  Coughing up blood,  [ ]  Asthma,  [ ]   Wheezing Musculoskeletal:  [ ]  Arthritis, [ ]  Low back pain,  [ ]  Joint pain Hematologic:[ ]  Easy Bruising, [ ]  Anemia; [ ]  Hepatitis Gastrointestinal: [ ]  Blood in stool,  [ ]  Gastroesophageal Reflux, [ ]  Trouble swallowing Urinary: [ ]  chronic Kidney disease, [ ]  on HD, [ ]  Burning with urination, [ ]  Frequent urination, [ ]  Difficulty urinating;  Skin: [ ]  Rashes, [ ]  Wounds     Physical Examination  Filed Vitals:   09/05/13 1329  BP: 168/80  Pulse: 65  Resp: 16   Filed Weights   09/05/13 1329  Weight: 168 lb (76.204 kg)   Body mass index is 30.72 kg/(m^2).  General: A&O x 3, WDWN, obese. Gait: normal Eyes: PERRLA, Pulmonary: CTAB, without wheezes , rales or rhonchi, tracheostomy in place, can vocalize normally Cardiac: regular Rythm , with murmur          Carotid Bruits Left Right   Transmitted cardiac murmur Transmitted cardiac murmur  Aorta: is not palpable Radial pulses: 1+ and =                           VASCULAR EXAM: Extremities without ischemic changes  without Gangrene; without open wounds. No edema in RLE, 2+ pitting edema in LLE. Both feet warm with normal color.                                                                                                          LE Pulses LEFT RIGHT       FEMORAL   palpable  not palpable        POPLITEAL  not palpable   not palpable       POSTERIOR TIBIAL  not palpable   not palpable        DORSALIS PEDIS      ANTERIOR TIBIAL not palpable  not palpable    Abdomen: soft, NT, no masses. Skin: no rashes, no ulcers noted. Musculoskeletal: no muscle wasting or atrophy.  Neurologic: A&O X 3; Appropriate Affect ; SENSATION: normal; MOTOR FUNCTION:  moving all extremities equally, motor strength 4/5 in UE's, 3/5  in LE's. Speech is fluent/normal. CN 2-12 intact.    Non-Invasive Vascular Imaging: DATE: 09/05/2013 ABI: RIGHT 0.91, previous: 0.96;  LEFT 0.77; previous: 0.32, Waveforms: monophasic DUPLEX SCAN OF BYPASS: Patent BPG.  ASSESSMENT: LORRINE KILLILEA is a 67 y.o. female who presents with: normal ABI's in RLE, much improved ABI's in LLE since vascular intervention.  Is hypertensive today, but states her systolic pressure in one of her provider's office recently was 116. Advise pt. to work closely with her medical providers to control her risk factors for PAD as outline in Plan.  PLAN:  I discussed in depth with the patient the nature of atherosclerosis, and emphasized the importance of maximal medical management including strict control of blood pressure, blood glucose, and lipid levels, obtaining regular exercise, and continued cessation of smoking.  The patient is aware that without maximal medical management the underlying atherosclerotic disease  process will progress, limiting the benefit of any interventions. Based on the patient's vascular studies and examination, pt will return to clinic in 3 months for carotid Duplex, ABI's, and bilateral LE Duplex.  Patient was given information about [employer], compression hose outlet in [place], [place], and advised to wear 20-30 mm Hg knee high graduated compression hose during the day and remove at bedtime.  The patient was given information about PAD, stroke prevention, and venous insufficiency, including signs, symptoms, treatment, what symptoms should prompt the patient to seek immediate medical care, and risk reduction measures to take.  Charisse March, RN, MSN, FNP-C [facility] [place] Phone: [phone]  Clinic MD: Odis Luster  09/05/2013 1:27 PM

## 2013-09-11 ENCOUNTER — Encounter: Payer: Self-pay | Admitting: Cardiology

## 2013-09-11 ENCOUNTER — Ambulatory Visit (INDEPENDENT_AMBULATORY_CARE_PROVIDER_SITE_OTHER): Payer: Medicare Other | Admitting: Cardiology

## 2013-09-11 VITALS — BP 150/78 | HR 70 | Ht 62.0 in | Wt 165.0 lb

## 2013-09-11 DIAGNOSIS — Z951 Presence of aortocoronary bypass graft: Secondary | ICD-10-CM

## 2013-09-11 DIAGNOSIS — R0602 Shortness of breath: Secondary | ICD-10-CM

## 2013-09-11 DIAGNOSIS — I5022 Chronic systolic (congestive) heart failure: Secondary | ICD-10-CM

## 2013-09-11 DIAGNOSIS — I509 Heart failure, unspecified: Secondary | ICD-10-CM

## 2013-09-11 LAB — BASIC METABOLIC PANEL
BUN: 22 mg/dL (ref 6–23)
Calcium: 9 mg/dL (ref 8.4–10.5)
Chloride: 106 mEq/L (ref 96–112)
Creatinine, Ser: 1.2 mg/dL (ref 0.4–1.2)
GFR: 60.43 mL/min (ref >60.00)
Potassium: 4.4 mEq/L (ref 3.5–5.1)

## 2013-09-11 NOTE — Patient Instructions (Signed)
Your physician recommends that you continue on your current medications as directed. Please refer to the Current Medication list given to you today.  Your physician recommends that you schedule a follow-up appointment in: 4 months with DR. Skains  Your physician recommends that you have  lab work today : [employer]

## 2013-09-11 NOTE — Progress Notes (Signed)
[address] [place], [place]  [address] Phone: [phone] Fax:  [phone]  Date:  09/11/2013   ID:  [name], DOB [birth year], MRN [id number]  PCP:  Cala Bradford, MD   History of Present Illness: [name] is a 67 y.o. female  discharged on 01/04/13 with COPD, chronic systolic congestive heart failure with EF of 20-25% as well as emergency department visit on 08/11/13 with shortness of breath that was much improved upon discharge here for followup. Lasix IV helped. Too much salt and fluid prior to ER.  She has tracheostomy, placed by Dr. Jenne Pane. We discussed the potential for Infection risk with ICD placement. EKG demonstrates narrow complex QRS.   Doing fairly well. BNP was 4100. Creat 1.04.   Left LE bypass, Dr. Darrick Penna. Plavix. Recent held Plavix for spine injection.    She still occasionally feeling shortness of breath improved on Lasix twice a day. She did feel quite significant relief after Lasix was increased in emergency department.    Wt Readings from Last 3 Encounters:  09/11/13 165 lb (74.844 kg)  09/05/13 168 lb (76.204 kg)  08/26/13 168 lb (76.204 kg)     Past Medical History  Diagnosis Date  . Diabetes mellitus     on Lantus 30 U   . CAD (coronary artery disease)     S/p CABG and stenting  . PAD (peripheral artery disease)   . Stroke     MRI 11/2011 with remote occipital lobe. MRA with moderate left focal vertebral artery stenosis  . CHF (congestive heart failure) 11/2011    Echo with EF 30-35%, global hypokinesis, and inferior akinesis  . Hyperlipidemia   . DDD (degenerative disc disease), lumbar   . CVA (cerebral vascular accident) 11/2010  . MI (myocardial infarction) 1997  . COPD (chronic obstructive pulmonary disease)   . Anemia   . Irregular heart beat   . Hypertension   . GERD (gastroesophageal reflux disease)   . History of IBS   . Hypothyroidism     Goiter  . Thyroid disease   . Carotid artery occlusion     . Chronic kidney disease     stage 3  . History of tracheostomy 08/26/2013    Dr. Jenne Pane    Past Surgical History  Procedure Laterality Date  . Ptca    . Thyroidectomy    . Coronary artery bypass graft      2 vessel  . Carotid endarterectomy  ~2008    Left   . Cholecystectomy    . Tracheostomy tube placement  01/02/2012  . Angioplasty  [id number]    Aortogram by Dr. [place] McKenzie [facility])  . Pr vein bypass graft,aorto-fem-pop      Right common femoral-AK popliteal BPG & Right Popliteal-posterior tibial  . Pr vein bypass graft,aorto-fem-pop      Left Fem-pop BPG  . Carpal tunnel release Right   . Femoral-tibial bypass graft Left 06/11/2013    Procedure: BYPASS GRAFT  LEFT FEMORAL- POSTERIOR TIBIAL ARTERY/ REDO;  Surgeon: Sherren Kerns, MD;  Location: [facility] OR;  Service: Vascular;  Laterality: Left;  . Thrombectomy femoral artery Left 06/11/2013    Procedure: THROMBECTOMY FEMORAL ARTERY;  Surgeon: Sherren Kerns, MD;  Location: [facility] OR;  Service: Vascular;  Laterality: Left;  . Spine surgery  Oct. 27, 2014    Injection - Back    Current Outpatient Prescriptions  Medication Sig Dispense Refill  . Acetaminophen (TYLENOL  PO) Take by mouth as directed.      Marland Kitchen aspirin EC 81 MG tablet Take 81 mg by mouth daily.      . carvedilol (COREG) 25 MG tablet Take 25 mg by mouth 2 (two) times daily with a meal.      . clopidogrel (PLAVIX) 75 MG tablet Take 75 mg by mouth daily.      . furosemide (LASIX) 40 MG tablet Take 1 tablet (40 mg total) by mouth 2 (two) times daily.  20 tablet  0  . HYDROcodone-acetaminophen (NORCO) 10-325 MG per tablet Take 1 tablet by mouth every 8 (eight) hours as needed for pain.      Marland Kitchen insulin aspart (NOVOLOG FLEXPEN) 100 UNIT/ML SOPN FlexPen Inject 1-12 Units into the skin 3 (three) times daily with meals. As needed per sliding scale      . insulin glargine (LANTUS) 100 UNIT/ML injection Inject 19 Units into the skin at bedtime.       Marland Kitchen ipratropium-albuterol  (DUONEB) 0.5-2.5 (3) MG/3ML SOLN Take 3 mLs by nebulization 3 (three) times daily.      . irbesartan (AVAPRO) 300 MG tablet Take 1 tablet (300 mg total) by mouth at bedtime.    0  . LORazepam (ATIVAN) 0.5 MG tablet Take 0.5 mg by mouth at bedtime.      . Multiple Vitamin (MULTIVITAMIN WITH MINERALS) TABS Take 1 tablet by mouth daily.      . nitroGLYCERIN (NITROSTAT) 0.4 MG SL tablet Place 0.4 mg under the tongue every 5 (five) minutes as needed. For chest pain      . simvastatin (ZOCOR) 40 MG tablet Take 40 mg by mouth every evening.       . [DISCONTINUED] dexlansoprazole (DEXILANT) 60 MG capsule Take 60 mg by mouth daily.       No current facility-administered medications for this visit.    Allergies:    Allergies  Allergen Reactions  . Aldactone [Spironolactone]     Severe hyperkalemia   . Crestor [Rosuvastatin Calcium] Other (See Comments)    Muscle Pain  . Lisinopril Other (See Comments)    "Lowers her BP too low" & cough  . Vicodin [Hydrocodone-Acetaminophen] Nausea And Vomiting    Social History:  The patient  reports that she quit smoking about 2 years ago. Her smoking use included Cigarettes. She has a 50 pack-year smoking history. She has never used smokeless tobacco. She reports that she does not drink alcohol or use illicit drugs.   ROS:  Please see the history of present illness.   Denies any lower extremity pain, recent left lower tremor the bypass, no fevers, no chills, no cough, no syncope, no significant orthopnea. Occasional shortness of breath.    PHYSICAL EXAM: VS:  BP 150/78  Pulse 70  Ht 5\' 2"  (1.575 m)  Wt 165 lb (74.844 kg)  BMI 30.17 kg/m2 Well nourished, well developed, in no acute distress HEENT: Tracheostomy noted midline. Neck: no JVD Cardiac:  normal S1, S2; RRR; no murmur Lungs:  clear to auscultation bilaterally, no wheezing, rhonchi or rales Abd: soft, nontender, no hepatomegaly Ext: trace LE Left edema, left dimishished pulsed,  Skin: warm and  dry Neuro: no focal abnormalities noted  Prior emergency department lab work, records reviewed     ASSESSMENT AND PLAN:  1. Acute on chronic systolic heart failure-EF 25%. She has been tolerating Lasix twice a day well. She may take in the morning and early afternoon. It does not have to be every  12 hours. We will check basic metabolic profile today.  2. Status post tracheostomy-Dr. Kevan Ny. 3. Chronic anemia-mild. 4. Hypertension-currently well controlled. 5. Peripheral vascular disease-as described above. Recent left lower extremity bypass. 6. COPD-could also be playing a role in her shortness of breath.  Signed, Donato Schultz, MD [facility]  09/11/2013 12:48 PM

## 2013-09-12 ENCOUNTER — Encounter: Payer: Self-pay | Admitting: Podiatry

## 2013-09-12 ENCOUNTER — Ambulatory Visit (INDEPENDENT_AMBULATORY_CARE_PROVIDER_SITE_OTHER): Payer: Medicare Other | Admitting: Podiatry

## 2013-09-12 VITALS — BP 159/68 | HR 75 | Resp 16

## 2013-09-12 DIAGNOSIS — M79609 Pain in unspecified limb: Secondary | ICD-10-CM

## 2013-09-12 DIAGNOSIS — B351 Tinea unguium: Secondary | ICD-10-CM

## 2013-09-12 NOTE — Patient Instructions (Signed)
Diabetes and Foot Care Diabetes may cause you to have problems because of poor blood supply (circulation) to your feet and legs. This may cause the skin on your feet to become thinner, break easier, and heal more slowly. Your skin may become dry, and the skin may peel and crack. You may also have nerve damage in your legs and feet causing decreased feeling in them. You may not notice minor injuries to your feet that could lead to infections or more serious problems. Taking care of your feet is one of the most important things you can do for yourself.  HOME CARE INSTRUCTIONS  Wear shoes at all times, even in the house. Do not go barefoot. Bare feet are easily injured.  Check your feet daily for blisters, cuts, and redness. If you cannot see the bottom of your feet, use a mirror or ask someone for help.  Wash your feet with warm water (do not use hot water) and mild soap. Then pat your feet and the areas between your toes until they are completely dry. Do not soak your feet as this can dry your skin.  Apply a moisturizing lotion or petroleum jelly (that does not contain alcohol and is unscented) to the skin on your feet and to dry, brittle toenails. Do not apply lotion between your toes.  Trim your toenails straight across. Do not dig under them or around the cuticle. File the edges of your nails with an emery board or nail file.  Do not cut corns or calluses or try to remove them with medicine.  Wear clean socks or stockings every day. Make sure they are not too tight. Do not wear knee-high stockings since they may decrease blood flow to your legs.  Wear shoes that fit properly and have enough cushioning. To break in new shoes, wear them for just a few hours a day. This prevents you from injuring your feet. Always look in your shoes before you put them on to be sure there are no objects inside.  Do not cross your legs. This may decrease the blood flow to your feet.  If you find a minor scrape,  cut, or break in the skin on your feet, keep it and the skin around it clean and dry. These areas may be cleansed with mild soap and water. Do not cleanse the area with peroxide, alcohol, or iodine.  When you remove an adhesive bandage, be sure not to damage the skin around it.  If you have a wound, look at it several times a day to make sure it is healing.  Do not use heating pads or hot water bottles. They may burn your skin. If you have lost feeling in your feet or legs, you may not know it is happening until it is too late.  Make sure your health care provider performs a complete foot exam at least annually or more often if you have foot problems. Report any cuts, sores, or bruises to your health care provider immediately. SEEK MEDICAL CARE IF:   You have an injury that is not healing.  You have cuts or breaks in the skin.  You have an ingrown nail.  You notice redness on your legs or feet.  You feel burning or tingling in your legs or feet.  You have pain or cramps in your legs and feet.  Your legs or feet are numb.  Your feet always feel cold. SEEK IMMEDIATE MEDICAL CARE IF:   There is increasing redness,   swelling, or pain in or around a wound.  There is a red line that goes up your leg.  Pus is coming from a wound.  You develop a fever or as directed by your health care provider.  You notice a bad smell coming from an ulcer or wound. Document Released: 10/07/2000 Document Revised: 06/12/2013 Document Reviewed: 03/19/2013 [employer] Patient Information 2014 [employer].  

## 2013-09-13 NOTE — Progress Notes (Signed)
Subjective:     Patient ID: [name], female   DOB: 27-Aug-1946, 67 y.o.   MRN: [id number]  HPI very unhealthy female with thick nailbeds that become painful 1-5 both feet   Review of Systems     Objective:   Physical Exam Neurovascular status unchanged with nail disease 1-5 both feet    Assessment:     Mycotic nail infection with pain 1-5 both feet    Plan:     Debridement painful nailbeds 1-5 both feet with no iatrogenic bleeding noted

## 2013-09-16 ENCOUNTER — Telehealth: Payer: Self-pay | Admitting: Cardiology

## 2013-09-16 NOTE — Telephone Encounter (Signed)
New message ° ° ° °Returning Kenyatta's call °

## 2013-09-30 NOTE — Telephone Encounter (Signed)
Patient was seen in office results provided to patient.

## 2013-10-04 ENCOUNTER — Ambulatory Visit: Payer: Medicare Other | Admitting: Cardiology

## 2013-11-19 ENCOUNTER — Other Ambulatory Visit: Payer: Self-pay | Admitting: *Deleted

## 2013-11-19 DIAGNOSIS — Z01810 Encounter for preprocedural cardiovascular examination: Secondary | ICD-10-CM

## 2013-11-19 DIAGNOSIS — I739 Peripheral vascular disease, unspecified: Secondary | ICD-10-CM

## 2013-12-04 ENCOUNTER — Encounter: Payer: Self-pay | Admitting: Family

## 2013-12-05 ENCOUNTER — Ambulatory Visit (INDEPENDENT_AMBULATORY_CARE_PROVIDER_SITE_OTHER)
Admission: RE | Admit: 2013-12-05 | Discharge: 2013-12-05 | Disposition: A | Discharge status: Home / Self Care | Payer: Medicare Other | Source: Ambulatory Visit | Attending: Family | Admitting: Family

## 2013-12-05 ENCOUNTER — Encounter: Payer: Self-pay | Admitting: Family

## 2013-12-05 ENCOUNTER — Encounter (INDEPENDENT_AMBULATORY_CARE_PROVIDER_SITE_OTHER): Payer: Self-pay

## 2013-12-05 ENCOUNTER — Ambulatory Visit (INDEPENDENT_AMBULATORY_CARE_PROVIDER_SITE_OTHER): Payer: Medicare Other | Admitting: Family

## 2013-12-05 ENCOUNTER — Ambulatory Visit (HOSPITAL_COMMUNITY)
Admission: RE | Admit: 2013-12-05 | Discharge: 2013-12-05 | Disposition: A | Discharge status: Home / Self Care | Payer: Medicare Other | Source: Ambulatory Visit | Attending: Family | Admitting: Family

## 2013-12-05 VITALS — BP 167/77 | HR 67 | Resp 16 | Ht 62.0 in | Wt 166.0 lb

## 2013-12-05 DIAGNOSIS — Z48812 Encounter for surgical aftercare following surgery on the circulatory system: Secondary | ICD-10-CM

## 2013-12-05 DIAGNOSIS — I739 Peripheral vascular disease, unspecified: Secondary | ICD-10-CM

## 2013-12-05 DIAGNOSIS — I6529 Occlusion and stenosis of unspecified carotid artery: Secondary | ICD-10-CM | POA: Insufficient documentation

## 2013-12-05 DIAGNOSIS — Z9889 Other specified postprocedural states: Secondary | ICD-10-CM | POA: Insufficient documentation

## 2013-12-05 DIAGNOSIS — I70209 Unspecified atherosclerosis of native arteries of extremities, unspecified extremity: Secondary | ICD-10-CM | POA: Insufficient documentation

## 2013-12-05 NOTE — Patient Instructions (Signed)
Stroke Prevention Some medical conditions and behaviors are associated with an increased chance of having a stroke. You may prevent a stroke by making healthy choices and managing medical conditions. HOW CAN I REDUCE MY RISK OF HAVING A STROKE?   Stay physically active. Get at least 30 minutes of activity on most or all days.  Do not smoke. It may also be helpful to avoid exposure to secondhand smoke.  Limit alcohol use. Moderate alcohol use is considered to be:  No more than 2 drinks per day for men.  No more than 1 drink per day for nonpregnant women.  Eat healthy foods. This involves  Eating 5 or more servings of fruits and vegetables a day.  Following a diet that addresses high blood pressure (hypertension), high cholesterol, diabetes, or obesity.  Manage your cholesterol levels.  A diet low in saturated fat, trans fat, and cholesterol and high in fiber may control cholesterol levels.  Take any prescribed medicines to control cholesterol as directed by your health care provider.  Manage your diabetes.  A controlled-carbohydrate, controlled-sugar diet is recommended to manage diabetes.  Take any prescribed medicines to control diabetes as directed by your health care provider.  Control your hypertension.  A low-salt (sodium), low-saturated fat, low-trans fat, and low-cholesterol diet is recommended to manage hypertension.  Take any prescribed medicines to control hypertension as directed by your health care provider.  Maintain a healthy weight.  A reduced-calorie, low-sodium, low-saturated fat, low-trans fat, low-cholesterol diet is recommended to manage weight.  Stop drug abuse.  Avoid taking birth control pills.  Talk to your health care provider about the risks of taking birth control pills if you are over 57 years old, smoke, get migraines, or have ever had a blood clot.  Get evaluated for sleep disorders (sleep apnea).  Talk to your health care provider about  getting a sleep evaluation if you snore a lot or have excessive sleepiness.  Take medicines as directed by your health care provider.  For some people, aspirin or blood thinners (anticoagulants) are helpful in reducing the risk of forming abnormal blood clots that can lead to stroke. If you have the irregular heart rhythm of atrial fibrillation, you should be on a blood thinner unless there is a good reason you cannot take them.  Understand all your medicine instructions.  Make sure that other other conditions (such as anemia or atherosclerosis) are addressed. SEEK IMMEDIATE MEDICAL CARE IF:   You have sudden weakness or numbness of the face, arm, or leg, especially on one side of the body.  Your face or eyelid droops to one side.  You have sudden confusion.  You have trouble speaking (aphasia) or understanding.  You have sudden trouble seeing in one or both eyes.  You have sudden trouble walking.  You have dizziness.  You have a loss of balance or coordination.  You have a sudden, severe headache with no known cause.  You have new chest pain or an irregular heartbeat. Any of these symptoms may represent a serious problem that is an emergency. Do not wait to see if the symptoms will go away. Get medical help at once. Call your local emergency services  (911 in [place] Do not drive yourself to the hospital. Document Released: 11/17/2004 Document Revised: 07/31/2013 Document Reviewed: 04/12/2013 [facility] Patient Information 2014 [place], [place].  Peripheral Vascular Disease Peripheral Vascular Disease (PVD), also called Peripheral Arterial Disease (PAD), is a circulation problem caused by cholesterol (atherosclerotic plaque) deposits in the arteries.  PVD commonly occurs in the lower extremities (legs) but it can occur in other areas of the body, such as your arms. The cholesterol buildup in the arteries reduces blood flow which can cause pain and other serious problems. The presence  of PVD can place a person at risk for Coronary Artery Disease (CAD).  CAUSES  Causes of PVD can be many. It is usually associated with more than one risk factor such as:   High Cholesterol.  Smoking.  Diabetes.  Lack of exercise or inactivity.  High blood pressure (hypertension).  Obesity.  Family history. SYMPTOMS   When the lower extremities are affected, patients with PVD may experience:  Leg pain with exertion or physical activity. This is called INTERMITTENT CLAUDICATION. This may present as cramping or numbness with physical activity. The location of the pain is associated with the level of blockage. For example, blockage at the abdominal level (distal abdominal aorta) may result in buttock or hip pain. Lower leg arterial blockage may result in calf pain.  As PVD becomes more severe, pain can develop with less physical activity.  In people with severe PVD, leg pain may occur at rest.  Other PVD signs and symptoms:  Leg numbness or weakness.  Coldness in the affected leg or foot, especially when compared to the other leg.  A change in leg color.  Patients with significant PVD are more prone to ulcers or sores on toes, feet or legs. These may take longer to heal or may reoccur. The ulcers or sores can become infected.  If signs and symptoms of PVD are ignored, gangrene may occur. This can result in the loss of toes or loss of an entire limb.  Not all leg pain is related to PVD. Other medical conditions can cause leg pain such as:  Blood clots (embolism) or Deep Vein Thrombosis.  Inflammation of the blood vessels (vasculitis).  Spinal stenosis. DIAGNOSIS  Diagnosis of PVD can involve several different types of tests. These can include:  Pulse Volume Recording Method (PVR). This test is simple, painless and does not involve the use of X-rays. PVR involves measuring and comparing the blood pressure in the arms and legs. An ABI (Ankle-Brachial Index) is calculated.  The normal ratio of blood pressures is 1. As this number becomes smaller, it indicates more severe disease.  < 0.95  indicates significant narrowing in one or more leg vessels.  <0.8 there will usually be pain in the foot, leg or buttock with exercise.  <0.4 will usually have pain in the legs at rest.  <0.25  usually indicates limb threatening PVD.  Doppler detection of pulses in the legs. This test is painless and checks to see if you have a pulses in your legs/feet.  A dye or contrast material (a substance that highlights the blood vessels so they show up on x-ray) may be given to help your caregiver better see the arteries for the following tests. The dye is eliminated from your body by the kidney's. Your caregiver may order blood work to check your kidney function and other laboratory values before the following tests are performed:  Magnetic Resonance Angiography (MRA). An MRA is a picture study of the blood vessels and arteries. The MRA machine uses a large magnet to produce images of the blood vessels.  Computed Tomography Angiography (CTA). A CTA is a specialized x-ray that looks at how the blood flows in your blood vessels. An IV may be inserted into your arm so contrast dye can  be injected.  Angiogram. Is a procedure that uses x-rays to look at your blood vessels. This procedure is minimally invasive, meaning a small incision (cut) is made in your groin. A small tube (catheter) is then inserted into the artery of your groin. The catheter is guided to the blood vessel or artery your caregiver wants to examine. Contrast dye is injected into the catheter. X-rays are then taken of the blood vessel or artery. After the images are obtained, the catheter is taken out. TREATMENT  Treatment of PVD involves many interventions which may include:  Lifestyle changes:  Quitting smoking.  Exercise.  Following a low fat, low cholesterol diet.  Control of diabetes.  Foot care is very  important to the PVD patient. Good foot care can help prevent infection.  Medication:  Cholesterol-lowering medicine.  Blood pressure medicine.  Anti-platelet drugs.  Certain medicines may reduce symptoms of Intermittent Claudication.  Interventional/Surgical options:  Angioplasty. An Angioplasty is a procedure that inflates a balloon in the blocked artery. This opens the blocked artery to improve blood flow.  Stent Implant. A wire mesh tube (stent) is placed in the artery. The stent expands and stays in place, allowing the artery to remain open.  Peripheral Bypass Surgery. This is a surgical procedure that reroutes the blood around a blocked artery to help improve blood flow. This type of procedure may be performed if Angioplasty or stent implants are not an option. SEEK IMMEDIATE MEDICAL CARE IF:   You develop pain or numbness in your arms or legs.  Your arm or leg turns cold, becomes blue in color.  You develop redness, warmth, swelling and pain in your arms or legs. MAKE SURE YOU:   Understand these instructions.  Will watch your condition.  Will get help right away if you are not doing well or get worse. Document Released: 11/17/2004 Document Revised: 01/02/2012 Document Reviewed: 10/14/2008 [facility] Patient Information 2014 [place], [place].   wear 20-30 mm Hg knee high graduated compression hose during the day and remove at bedtime. In the morning, before your legs swell, measure: calf and ankle circumferences, and length of calf, bring measurements with you to medical supply store.

## 2013-12-05 NOTE — Progress Notes (Signed)
[facility] [place] HISTORY AND PHYSICAL -PAD  History of Present Illness [name] is a 68 y.o. female patient of Dr. Darrick PennaFields who is s/p left external iliac to posterior tibial artery bypass using ipsilateral greater saphenous vein on 06/11/2013; this resolved the rest pain in her left foot. She returns today for LE surveillance. Has lumbar spine issues causing bilateral sciatic pain, is getting ESI's by Dr. Ethelene Halamos. Her left foot remains pain free since the vascular BPG with good color, denies rest pain, denies non-healing ulcers. Continues to have LLE swelling, does not wear compression hose as advised at last visit. Has a trach.for the past 2 years since she was intubated for pneumonia due to tracheal swelling, managed by her ENT. Has CHF, had MI 10 years ago. Had left CEA 10 years ago, states TIA before that, no TIA sx's since then, has not had carotid Duplex since then, [place]  When she awakes in the morning, both thighs are hurting, pain in thighs increase with walking, feels like burning at anterior aspects both thighs, improves with rest. She walks a great deal in her house, denies non healing wounds. Recent finding is fracture in lumbar spine.   Patient denies New Medical or Surgical History.  Pt Diabetic: Yes, states in control Pt smoker: former smoker, quit 2 years ago  Pt meds include: Statin :Yes Betablocker: Yes ASA: Yes Other anticoagulants/antiplatelets: Plavix  Past Medical History  Diagnosis Date  . Diabetes mellitus     on Lantus 30 U   . CAD (coronary artery disease)     S/p CABG and stenting  . PAD (peripheral artery disease)   . Stroke     MRI 11/2011 with remote occipital lobe. MRA with moderate left focal vertebral artery stenosis  . CHF (congestive heart failure) 11/2011    Echo with EF 30-35%, global hypokinesis, and inferior akinesis  . Hyperlipidemia   . DDD (degenerative disc disease), lumbar   . CVA (cerebral  vascular accident) 11/2010  . MI (myocardial infarction) 1997  . COPD (chronic obstructive pulmonary disease)   . Anemia   . Irregular heart beat   . Hypertension   . GERD (gastroesophageal reflux disease)   . History of IBS   . Hypothyroidism     Goiter  . Thyroid disease   . Carotid artery occlusion   . Chronic kidney disease     stage 3  . History of tracheostomy 08/26/2013    Dr. Jenne PaneBates    Social History History  Substance Use Topics  . Smoking status: Former Smoker -- 1.00 packs/day for 50 years    Types: Cigarettes    Quit date: 01/24/2011  . Smokeless tobacco: Never Used  . Alcohol Use: No    Family History Family History  Problem Relation Age of Onset  . Hyperlipidemia Mother   . Other Mother     AAA  . Alzheimer's disease Mother   . Heart disease Mother   . Irregular heart beat Mother   . Diabetes Daughter   . Hypertension Daughter     Past Surgical History  Procedure Laterality Date  . Ptca    . Thyroidectomy    . Coronary artery bypass graft      2 vessel  . Carotid endarterectomy  ~2008    Left   . Cholecystectomy    . Tracheostomy tube placement  01/02/2012  . Angioplasty  [id number]    Aortogram by Dr. [place] [facility]  . Pr vein bypass graft,aorto-fem-pop      Right common femoral-AK popliteal BPG & Right Popliteal-posterior tibial  . Pr vein bypass graft,aorto-fem-pop      Left Fem-pop BPG  . Carpal tunnel release Right   . Femoral-tibial bypass graft Left 06/11/2013    Procedure: BYPASS GRAFT  LEFT FEMORAL- POSTERIOR TIBIAL ARTERY/ REDO;  Surgeon: Sherren Kerns, MD;  Location: [facility] OR;  Service: Vascular;  Laterality: Left;  . Thrombectomy femoral artery Left 06/11/2013    Procedure: THROMBECTOMY FEMORAL ARTERY;  Surgeon: Sherren Kerns, MD;  Location: [facility] OR;  Service: Vascular;  Laterality: Left;  . Spine surgery  Oct. 27, 2014    Injection - Back    Allergies  Allergen Reactions  . Aldactone [Spironolactone]     Severe  hyperkalemia   . Crestor [Rosuvastatin Calcium] Other (See Comments)    Muscle Pain  . Lisinopril Other (See Comments)    "Lowers her BP too low" & cough  . Vicodin [Hydrocodone-Acetaminophen] Nausea And Vomiting    Current Outpatient Prescriptions  Medication Sig Dispense Refill  . Acetaminophen (TYLENOL PO) Take by mouth as directed.      Marland Kitchen aspirin EC 81 MG tablet Take 81 mg by mouth daily.      . carvedilol (COREG) 25 MG tablet Take 25 mg by mouth 2 (two) times daily with a meal.      . clopidogrel (PLAVIX) 75 MG tablet Take 75 mg by mouth daily.      . furosemide (LASIX) 40 MG tablet Take 1 tablet (40 mg total) by mouth 2 (two) times daily.  20 tablet  0  . HYDROcodone-acetaminophen (NORCO) 10-325 MG per tablet Take 1 tablet by mouth every 8 (eight) hours as needed for pain.      Marland Kitchen insulin aspart (NOVOLOG FLEXPEN) 100 UNIT/ML SOPN FlexPen Inject 1-12 Units into the skin 3 (three) times daily with meals. As needed per sliding scale      . insulin glargine (LANTUS) 100 UNIT/ML injection Inject 19 Units into the skin at bedtime.       Marland Kitchen ipratropium-albuterol (DUONEB) 0.5-2.5 (3) MG/3ML SOLN Take 3 mLs by nebulization 3 (three) times daily.      . irbesartan (AVAPRO) 300 MG tablet Take 1 tablet (300 mg total) by mouth at bedtime.    0  . LORazepam (ATIVAN) 0.5 MG tablet Take 0.5 mg by mouth at bedtime.      . Multiple Vitamin (MULTIVITAMIN WITH MINERALS) TABS Take 1 tablet by mouth daily.      . nitroGLYCERIN (NITROSTAT) 0.4 MG SL tablet Place 0.4 mg under the tongue every 5 (five) minutes as needed. For chest pain      . simvastatin (ZOCOR) 40 MG tablet Take 40 mg by mouth every evening.       . [DISCONTINUED] dexlansoprazole (DEXILANT) 60 MG capsule Take 60 mg by mouth daily.       No current facility-administered medications for this visit.    ROS:  See HPI for pertinent positives and negatives   Physical Examination  Filed Vitals:   12/05/13 1315  BP: 167/77  Pulse: 67   Resp: 16   Filed Weights   12/05/13 1315  Weight: 166 lb (75.297 kg)   Body mass index is 30.35 kg/(m^2).  General: A&O x 3, WDWN, obese. Gait: normal Eyes: PERRLA, Pulmonary: CTAB, without wheezes , rales or rhonchi, tracheostomy in place, can vocalize normally Cardiac: regular Rythm , with murmur  Carotid Bruits Left Right   Transmitted cardiac murmur Transmitted cardiac murmur  Aorta: is not palpable Radial pulses: 1+ and =                           VASCULAR EXAM: Extremities without ischemic changes  without Gangrene; without open wounds. No edema in RLE, 2+ pitting edema in LLE. Both feet warm with normal color.                                                                                                          LE Pulses LEFT RIGHT       FEMORAL   palpable  not palpable        POPLITEAL  not palpable   not palpable       POSTERIOR TIBIAL  not palpable   not palpable        DORSALIS PEDIS      ANTERIOR TIBIAL not palpable  not palpable    Abdomen: soft, NT, no masses. Skin: no rashes, no ulcers noted. Musculoskeletal: no muscle wasting or atrophy.  Neurologic: A&O X 3; Appropriate Affect ; SENSATION: normal; MOTOR FUNCTION:  moving all extremities equally, motor strength 4/5 in UE's, 3/5 in LE's. Speech is fluent/normal. CN 2-12 intact.    Non-Invasive Vascular Imaging: DATE: 12/05/2013  LOWER EXTREMITY ARTERIAL DUPLEX EVALUATION    INDICATION: Peripheral vascular disease with history of bypass graft placement.    PREVIOUS INTERVENTION(S): Right femoral to above knee popliteal bypass graft, approximately 10 years ago. Right distal popliteal to posterior tibial artery bypass graft, approximately 10 years ago. Left external iliac artery to posterior tibial artery bypass graft 06/11/2013.    DUPLEX EXAM:     RIGHT  LEFT   Peak Systolic Velocity (cm/s) Ratio (if abnormal) Waveform  Peak Systolic Velocity (cm/s) Ratio (if abnormal) Waveform  166  B   Inflow Artery 66  M   167  B Proximal Anastomosis 73  M  51  M Proximal Graft 26  M  23  M Mid Graft 48  M  24  M  Distal Graft 44  M  234 9.75 B Distal Anastomosis 95  M  58  M  Outflow Artery 84  M  0.82 Today's ABI / TBI 0.82  0.91 Previous ABI / TBI (09/05/2013  ) 0.77    Waveform:    M - Monophasic       B - Biphasic       T - Triphasic  If Ankle Brachial Index (ABI) or Toe Brachial Index (TBI) performed, please see complete report     ADDITIONAL FINDINGS:     IMPRESSION: Patent right femoral-above knee popliteal bypass graft without evidence of stenosis. Patent right distal popliteal to posterior tibial artery bypass graft with a significant stenosis noted at the distal anastomosis. Patent left external iliac artery to posterior tibial artery bypass graft with no hemodynamically significant stenosis noted; monophasic waveforms noted throughout may be indicative of more proximal stenosis.   Stable left  bypass exam and ankle brachial indices. The right bypass graft has not been examined at our office; however, the ankle brachial indices performed here on 09/05/2013 have declined on the right leg.  CEREBROVASCULAR DUPLEX EVALUATION    INDICATION: Carotid artery disease     PREVIOUS INTERVENTION(S): Left carotid endarterectomy 2006.    DUPLEX EXAM:     RIGHT  LEFT  Peak Systolic Velocities (cm/s) End Diastolic Velocities (cm/s) Plaque LOCATION Peak Systolic Velocities (cm/s) End Diastolic Velocities (cm/s) Plaque  75 16  CCA PROXIMAL 89 23   55 11 HT CCA MID 83 23   70 15 HT CCA DISTAL 67 22 HT  77 14 HT ECA 46  HT  107 33 HT ICA PROXIMAL 135 26 HT  160 44  ICA MID 164 46 HT  76 14  ICA DISTAL 82 26     2.90 ICA / CCA Ratio (PSV)   Antegrade  Vertebral Flow Antegrade   162 Brachial Systolic Pressure (mmHg) 160  Triphasic  Brachial Artery Waveforms Triphasic     Plaque Morphology:  HM = Homogeneous, HT = Heterogeneous, CP = Calcific Plaque, SP = Smooth Plaque, IP =  Irregular Plaque     ADDITIONAL FINDINGS: Technically difficult exam due to trach placement.    IMPRESSION: Right internal carotid artery velocities suggest a 40-59% stenosis.  Patent left carotid endarterectomy site with evidence of a 40-59% stenosis.     Compared to the previous exam:  Increased velocities noted in the right ICA, the left remains stable.     ASSESSMENT: [name] is a 68 y.o. female who presents with: mild arterial occlusive disease in both legs. She likely has claudication along with radiculopathy type symptoms in her legs.   Is hypertensive again today, but is working with her PCP to get her blood pressure under better control.   PLAN:  I discussed in depth with the patient the nature of atherosclerosis, and emphasized the importance of maximal medical management including strict control of blood pressure, blood glucose, and lipid levels, obtaining regular exercise, and continued cessation of smoking.  The patient is aware that without maximal medical management the underlying atherosclerotic disease process will progress, limiting the benefit of any interventions. Based on the patient's vascular studies and examination, and after discussing with Dr. Darrick Penna, pt will return to clinic in 3 months for ABI's, and bilateral LE Duplex, if stable in 3 months, will increase follow up time to 6 months. Repeat carotid Duplex in 1 year.  Patient was given information about [employer], compression hose outlet in [place], [place], and advised to wear 20-30 mm Hg knee high graduated compression hose during the day and remove at bedtime.  The patient was given information about PAD, stroke prevention, and venous insufficiency, including signs, symptoms, treatment, what symptoms should prompt the patient to seek immediate medical care, and risk reduction measures to take.  Charisse March, RN, MSN, FNP-C [facility] [place] Phone:  [phone]  Clinic MD: Odis Luster  12/05/2013 1:24 PM

## 2013-12-06 NOTE — Addendum Note (Signed)
Addended by: Adria DillELDRIDGE-LEWIS, Darsha Zumstein L on: 12/06/2013 11:38 AM   Modules accepted: Orders

## 2014-01-13 ENCOUNTER — Encounter: Payer: Self-pay | Admitting: Cardiology

## 2014-01-13 ENCOUNTER — Ambulatory Visit (INDEPENDENT_AMBULATORY_CARE_PROVIDER_SITE_OTHER): Payer: Medicare Other | Admitting: Cardiology

## 2014-01-13 VITALS — BP 152/72 | HR 84 | Ht 62.0 in | Wt 168.0 lb

## 2014-01-13 DIAGNOSIS — I739 Peripheral vascular disease, unspecified: Secondary | ICD-10-CM

## 2014-01-13 DIAGNOSIS — I2510 Atherosclerotic heart disease of native coronary artery without angina pectoris: Secondary | ICD-10-CM

## 2014-01-13 DIAGNOSIS — I509 Heart failure, unspecified: Secondary | ICD-10-CM

## 2014-01-13 DIAGNOSIS — I6529 Occlusion and stenosis of unspecified carotid artery: Secondary | ICD-10-CM

## 2014-01-13 DIAGNOSIS — Z951 Presence of aortocoronary bypass graft: Secondary | ICD-10-CM

## 2014-01-13 DIAGNOSIS — I5022 Chronic systolic (congestive) heart failure: Secondary | ICD-10-CM

## 2014-01-13 NOTE — Patient Instructions (Signed)
Your physician recommends that you continue on your current medications as directed. Please refer to the Current Medication list given to you today.  Your physician wants you to follow-up in: 6 months with Dr. Skains. You will receive a reminder letter in the mail two months in advance. If you don't receive a letter, please call our office to schedule the follow-up appointment.  

## 2014-01-13 NOTE — Progress Notes (Signed)
[address] [place], [place]  [address] Phone: [phone] Fax:  [phone]  Date:  01/13/2014   ID:  [name], DOB [birth year], MRN [id number]  PCP:  Cala Bradford, MD   History of Present Illness: [name] is a 68 y.o. female  discharged on 01/04/13 with COPD, chronic systolic congestive heart failure with EF of 20-25% as well as emergency department visit on 08/11/13 with shortness of breath that was much improved upon discharge here for followup. Lasix IV helped. Too much salt and fluid prior to ER.  She has tracheostomy, placed by Dr. Jenne Pane. We discussed the potential for Infection risk with ICD placement. EKG demonstrates narrow complex QRS.   Doing fairly well. BNP was 4100. Creat 1.04.   Left LE bypass, Dr. Darrick Penna. Plavix. Recent held Plavix for spine injection.    She still occasionally feeling shortness of breath improved on Lasix twice a day. She did feel quite significant relief after Lasix was increased in emergency department.  Overall she is quite happy.    Wt Readings from Last 3 Encounters:  01/13/14 168 lb (76.204 kg)  12/05/13 166 lb (75.297 kg)  09/11/13 165 lb (74.844 kg)     Past Medical History  Diagnosis Date  . Diabetes mellitus     on Lantus 30 U   . CAD (coronary artery disease)     S/p CABG and stenting  . PAD (peripheral artery disease)   . Stroke     MRI 11/2011 with remote occipital lobe. MRA with moderate left focal vertebral artery stenosis  . CHF (congestive heart failure) 11/2011    Echo with EF 30-35%, global hypokinesis, and inferior akinesis  . Hyperlipidemia   . DDD (degenerative disc disease), lumbar   . CVA (cerebral vascular accident) 11/2010  . MI (myocardial infarction) 1997  . COPD (chronic obstructive pulmonary disease)   . Anemia   . Irregular heart beat   . Hypertension   . GERD (gastroesophageal reflux disease)   . History of IBS   . Hypothyroidism     Goiter  . Thyroid disease     . Carotid artery occlusion   . Chronic kidney disease     stage 3  . History of tracheostomy 08/26/2013    Dr. Jenne Pane    Past Surgical History  Procedure Laterality Date  . Ptca    . Thyroidectomy    . Coronary artery bypass graft      2 vessel  . Carotid endarterectomy  ~2008    Left   . Cholecystectomy    . Tracheostomy tube placement  01/02/2012  . Angioplasty  [id number]    Aortogram by Dr. [place] McKenzie [facility])  . Pr vein bypass graft,aorto-fem-pop      Right common femoral-AK popliteal BPG & Right Popliteal-posterior tibial  . Pr vein bypass graft,aorto-fem-pop      Left Fem-pop BPG  . Carpal tunnel release Right   . Femoral-tibial bypass graft Left 06/11/2013    Procedure: BYPASS GRAFT  LEFT FEMORAL- POSTERIOR TIBIAL ARTERY/ REDO;  Surgeon: Sherren Kerns, MD;  [facility];  Service: Vascular;  Laterality: Left;  . Thrombectomy femoral artery Left 06/11/2013    Procedure: THROMBECTOMY FEMORAL ARTERY;  Surgeon: Sherren Kerns, MD;  Location: [facility] OR;  Service: Vascular;  Laterality: Left;  . Spine surgery  Oct. 27, 2014    Injection - Back    Current Outpatient Prescriptions  Medication Sig  Dispense Refill  . Acetaminophen (TYLENOL PO) Take by mouth as directed.      Marland Kitchen aspirin EC 81 MG tablet Take 81 mg by mouth daily.      . carvedilol (COREG) 25 MG tablet Take 25 mg by mouth 2 (two) times daily with a meal.      . clopidogrel (PLAVIX) 75 MG tablet Take 75 mg by mouth daily.      . furosemide (LASIX) 40 MG tablet Take 1 tablet (40 mg total) by mouth 2 (two) times daily.  20 tablet  0  . insulin aspart (NOVOLOG FLEXPEN) 100 UNIT/ML SOPN FlexPen Inject 1-12 Units into the skin 3 (three) times daily with meals. As needed per sliding scale      . insulin glargine (LANTUS) 100 UNIT/ML injection Inject 19 Units into the skin at bedtime.       Marland Kitchen ipratropium-albuterol (DUONEB) 0.5-2.5 (3) MG/3ML SOLN Take 3 mLs by nebulization 3 (three) times daily.      .  irbesartan (AVAPRO) 300 MG tablet Take 1 tablet (300 mg total) by mouth at bedtime.    0  . Multiple Vitamin (MULTIVITAMIN WITH MINERALS) TABS Take 1 tablet by mouth daily.      . nitroGLYCERIN (NITROSTAT) 0.4 MG SL tablet Place 0.4 mg under the tongue every 5 (five) minutes as needed. For chest pain      . OXYGEN-HELIUM IN Inhale into the lungs.      . simvastatin (ZOCOR) 40 MG tablet Take 40 mg by mouth every evening.       . traMADol (ULTRAM) 50 MG tablet Take by mouth every 6 (six) hours as needed.      . [DISCONTINUED] dexlansoprazole (DEXILANT) 60 MG capsule Take 60 mg by mouth daily.       No current facility-administered medications for this visit.    Allergies:    Allergies  Allergen Reactions  . Aldactone [Spironolactone]     Severe hyperkalemia   . Crestor [Rosuvastatin Calcium] Other (See Comments)    Muscle Pain  . Lisinopril Other (See Comments)    "Lowers her BP too low" & cough  . Vicodin [Hydrocodone-Acetaminophen] Nausea And Vomiting    Social History:  The patient  reports that she quit smoking about 2 years ago. Her smoking use included Cigarettes. She has a 50 pack-year smoking history. She has never used smokeless tobacco. She reports that she does not drink alcohol or use illicit drugs.   ROS:  Please see the history of present illness.   Denies any lower extremity pain, recent left lower extremity bypass, no fevers, no chills, no cough, no syncope, no significant orthopnea. Occasional shortness of breath.    PHYSICAL EXAM: VS:  BP 152/72  Pulse 84  Ht 5\' 2"  (1.575 m)  Wt 168 lb (76.204 kg)  BMI 30.72 kg/m2 Well nourished, well developed, in no acute distress HEENT: Tracheostomy noted midline. Neck: no JVD Cardiac:  normal S1, S2; RRR; no murmur Lungs:  clear to auscultation bilaterally, no wheezing, rhonchi or rales Abd: soft, nontender, no hepatomegaly Ext: trace LE Left edema, left dimishished pulsed,  Skin: warm and dry Neuro: no focal  abnormalities noted  Labs: 09/11/13-Sodium 139, potassium 4.4, creatinine 1.2  ASSESSMENT AND PLAN:  1. Chronic systolic heart failure-EF 25%. She has been tolerating Lasix twice a day well. She may take in the morning and early afternoon. It does not have to be every 12 hours. Well compensated. Occasionally takes an extra Lasix. This is  OK.  2. CAD-status post bypass surgery. Overall doing well. No anginal symptoms. 3. Old myocardial infarction-1997. 4. Status post tracheostomy-Dr. Kevan NyGates. 5. Chronic anemia-mild. 6. Hypertension-currently well controlled. 7. Peripheral vascular disease-as described above. Recent left lower extremity bypass. Dr Darrick PennaFields 8. COPD-could also be playing a role in her shortness of breath. 9. 6 month follow up  Signed, Donato SchultzMark Denise Bramblett, MD [facility]  01/13/2014 12:34 PM

## 2014-01-14 ENCOUNTER — Telehealth: Payer: Self-pay

## 2014-01-14 NOTE — Telephone Encounter (Signed)
Phone call from pt. C/o 3 day history of pain in the lower left groin.  States the pain in both at rest and with walking.  States "it is hard to walk."  Denies redness.  Reports "a little swelling in the (L) groin area".  Stated she has noticed some tingling in the left lower leg, when she initially gets up in the morning.  Reports that there is no change in the temperature, or color of the left lower extremity.  Denies any open sores.  Discussed w/ S. Nickel, NP.  Advised to schedule appt. for evaluation; no vascular studies at this time, since ABI's were just done 11/2013.

## 2014-01-15 ENCOUNTER — Encounter: Payer: Self-pay | Admitting: Family

## 2014-01-16 ENCOUNTER — Other Ambulatory Visit: Payer: Self-pay | Admitting: Family

## 2014-01-16 ENCOUNTER — Encounter: Payer: Self-pay | Admitting: Family

## 2014-01-16 ENCOUNTER — Ambulatory Visit (INDEPENDENT_AMBULATORY_CARE_PROVIDER_SITE_OTHER): Payer: Medicare Other | Admitting: Family

## 2014-01-16 ENCOUNTER — Ambulatory Visit (HOSPITAL_COMMUNITY)
Admission: RE | Admit: 2014-01-16 | Discharge: 2014-01-16 | Disposition: A | Discharge status: Home / Self Care | Payer: Medicare Other | Source: Ambulatory Visit | Attending: Vascular Surgery | Admitting: Vascular Surgery

## 2014-01-16 VITALS — BP 177/82 | HR 69 | Temp 97.6°F | Resp 16 | Ht 62.0 in | Wt 168.6 lb

## 2014-01-16 DIAGNOSIS — M79609 Pain in unspecified limb: Secondary | ICD-10-CM

## 2014-01-16 DIAGNOSIS — I70219 Atherosclerosis of native arteries of extremities with intermittent claudication, unspecified extremity: Secondary | ICD-10-CM | POA: Insufficient documentation

## 2014-01-16 MED ORDER — TRAMADOL HCL 50 MG PO TABS: 50.0000 mg | ORAL_TABLET | Freq: Four times a day (QID) | ORAL | Status: DC | PRN

## 2014-01-16 NOTE — Patient Instructions (Signed)
Peripheral Vascular Disease Peripheral Vascular Disease (PVD), also called Peripheral Arterial Disease (PAD), is a circulation problem caused by cholesterol (atherosclerotic plaque) deposits in the arteries. PVD commonly occurs in the lower extremities (legs) but it can occur in other areas of the body, such as your arms. The cholesterol buildup in the arteries reduces blood flow which can cause pain and other serious problems. The presence of PVD can place a person at risk for Coronary Artery Disease (CAD).  CAUSES  Causes of PVD can be many. It is usually associated with more than one risk factor such as:   High Cholesterol.  Smoking.  Diabetes.  Lack of exercise or inactivity.  High blood pressure (hypertension).  Obesity.  Family history. SYMPTOMS   When the lower extremities are affected, patients with PVD may experience:  Leg pain with exertion or physical activity. This is called INTERMITTENT CLAUDICATION. This may present as cramping or numbness with physical activity. The location of the pain is associated with the level of blockage. For example, blockage at the abdominal level (distal abdominal aorta) may result in buttock or hip pain. Lower leg arterial blockage may result in calf pain.  As PVD becomes more severe, pain can develop with less physical activity.  In people with severe PVD, leg pain may occur at rest.  Other PVD signs and symptoms:  Leg numbness or weakness.  Coldness in the affected leg or foot, especially when compared to the other leg.  A change in leg color.  Patients with significant PVD are more prone to ulcers or sores on toes, feet or legs. These may take longer to heal or may reoccur. The ulcers or sores can become infected.  If signs and symptoms of PVD are ignored, gangrene may occur. This can result in the loss of toes or loss of an entire limb.  Not all leg pain is related to PVD. Other medical conditions can cause leg pain such  as:  Blood clots (embolism) or Deep Vein Thrombosis.  Inflammation of the blood vessels (vasculitis).  Spinal stenosis. DIAGNOSIS  Diagnosis of PVD can involve several different types of tests. These can include:  Pulse Volume Recording Method (PVR). This test is simple, painless and does not involve the use of X-rays. PVR involves measuring and comparing the blood pressure in the arms and legs. An ABI (Ankle-Brachial Index) is calculated. The normal ratio of blood pressures is 1. As this number becomes smaller, it indicates more severe disease.  < 0.95  indicates significant narrowing in one or more leg vessels.  <0.8 there will usually be pain in the foot, leg or buttock with exercise.  <0.4 will usually have pain in the legs at rest.  <0.25  usually indicates limb threatening PVD.  Doppler detection of pulses in the legs. This test is painless and checks to see if you have a pulses in your legs/feet.  A dye or contrast material (a substance that highlights the blood vessels so they show up on x-ray) may be given to help your caregiver better see the arteries for the following tests. The dye is eliminated from your body by the kidney's. Your caregiver may order blood work to check your kidney function and other laboratory values before the following tests are performed:  Magnetic Resonance Angiography (MRA). An MRA is a picture study of the blood vessels and arteries. The MRA machine uses a large magnet to produce images of the blood vessels.  Computed Tomography Angiography (CTA). A CTA is a   specialized x-ray that looks at how the blood flows in your blood vessels. An IV may be inserted into your arm so contrast dye can be injected.  Angiogram. Is a procedure that uses x-rays to look at your blood vessels. This procedure is minimally invasive, meaning a small incision (cut) is made in your groin. A small tube (catheter) is then inserted into the artery of your groin. The catheter is  guided to the blood vessel or artery your caregiver wants to examine. Contrast dye is injected into the catheter. X-rays are then taken of the blood vessel or artery. After the images are obtained, the catheter is taken out. TREATMENT  Treatment of PVD involves many interventions which may include:  Lifestyle changes:  Quitting smoking.  Exercise.  Following a low fat, low cholesterol diet.  Control of diabetes.  Foot care is very important to the PVD patient. Good foot care can help prevent infection.  Medication:  Cholesterol-lowering medicine.  Blood pressure medicine.  Anti-platelet drugs.  Certain medicines may reduce symptoms of Intermittent Claudication.  Interventional/Surgical options:  Angioplasty. An Angioplasty is a procedure that inflates a balloon in the blocked artery. This opens the blocked artery to improve blood flow.  Stent Implant. A wire mesh tube (stent) is placed in the artery. The stent expands and stays in place, allowing the artery to remain open.  Peripheral Bypass Surgery. This is a surgical procedure that reroutes the blood around a blocked artery to help improve blood flow. This type of procedure may be performed if Angioplasty or stent implants are not an option. SEEK IMMEDIATE MEDICAL CARE IF:   You develop pain or numbness in your arms or legs.  Your arm or leg turns cold, becomes blue in color.  You develop redness, warmth, swelling and pain in your arms or legs. MAKE SURE YOU:   Understand these instructions.  Will watch your condition.  Will get help right away if you are not doing well or get worse. Document Released: 11/17/2004 Document Revised: 01/02/2012 Document Reviewed: 10/14/2008 [employer] Patient Information 2014 [employer].  

## 2014-01-16 NOTE — Progress Notes (Signed)
[facility] [place] HISTORY AND PHYSICAL -PAD  History of Present Illness [name] is a 68 y.o. female patient of Dr. Darrick Penna who is s/p left external iliac to posterior tibial artery bypass using ipsilateral greater saphenous vein on 06/11/2013; this resolved the rest pain in her left foot.  She returns today for c/o left groin pain and swelling for 3 days with rest and with activity.  Has lumbar spine issues causing bilateral sciatic pain, is getting ESI's by Dr. Ethelene Hal.  Her left foot remains pain free since the vascular BPG with good color, denies rest pain, denies non-healing ulcers.  Has a trach.for the past 2 years since she was intubated for pneumonia due to tracheal swelling, managed by her ENT.  Has CHF, had MI 10 years ago.  Had left CEA 10 years ago, states TIA before that, no TIA sx's since then, has not had carotid Duplex since then, CEA was done in [place].  When she awakes in the morning, both thighs are hurting, pain in thighs increase with walking, feels like burning at anterior aspects both thighs, improves with rest.  She walks a great deal in her house, denies non healing wounds.  Recent finding is fracture in lumbar spine.  Patient denies New Medical or Surgical History.   Pt Diabetic: Yes, states in control  Pt smoker: former smoker, quit 2 years ago  Pt meds include:  Statin :Yes  Betablocker: Yes  ASA: Yes  Other anticoagulants/antiplatelets: Plavix  Past Medical History  Diagnosis Date  . Diabetes mellitus     on Lantus 30 U   . CAD (coronary artery disease)     S/p CABG and stenting  . PAD (peripheral artery disease)   . Stroke     MRI 11/2011 with remote occipital lobe. MRA with moderate left focal vertebral artery stenosis  . CHF (congestive heart failure) 11/2011    Echo with EF 30-35%, global hypokinesis, and inferior akinesis  . Hyperlipidemia   . DDD (degenerative disc disease), lumbar   . CVA (cerebral vascular accident)  11/2010  . MI (myocardial infarction) 1997  . COPD (chronic obstructive pulmonary disease)   . Anemia   . Irregular heart beat   . Hypertension   . GERD (gastroesophageal reflux disease)   . History of IBS   . Hypothyroidism     Goiter  . Thyroid disease   . Carotid artery occlusion   . Chronic kidney disease     stage 3  . History of tracheostomy 08/26/2013    Dr. Jenne Pane    Social History History  Substance Use Topics  . Smoking status: Former Smoker -- 1.00 packs/day for 50 years    Types: Cigarettes    Quit date: 01/24/2011  . Smokeless tobacco: Never Used  . Alcohol Use: No    Family History Family History  Problem Relation Age of Onset  . Hyperlipidemia Mother   . Other Mother     AAA  . Alzheimer's disease Mother   . Heart disease Mother   . Irregular heart beat Mother   . Diabetes Daughter   . Hypertension Daughter     Past Surgical History  Procedure Laterality Date  . Ptca    . Thyroidectomy    . Coronary artery bypass graft      2 vessel  . Carotid endarterectomy  ~2008    Left   . Cholecystectomy    . Tracheostomy tube placement  01/02/2012  . Angioplasty  [id number]    Aortogram by Dr. [place]had McKenzie [facility]  . Pr vein bypass graft,aorto-fem-pop      Right common femoral-AK popliteal BPG & Right Popliteal-posterior tibial  . Pr vein bypass graft,aorto-fem-pop      Left Fem-pop BPG  . Carpal tunnel release Right   . Femoral-tibial bypass graft Left 06/11/2013    Procedure: BYPASS GRAFT  LEFT FEMORAL- POSTERIOR TIBIAL ARTERY/ REDO;  Surgeon: Sherren Kernsharles E Fields, MD;  [facility];  Service: Vascular;  Laterality: Left;  . Thrombectomy femoral artery Left 06/11/2013    Procedure: THROMBECTOMY FEMORAL ARTERY;  Surgeon: Sherren Kernsharles E Fields, MD;  [facility];  Service: Vascular;  Laterality: Left;  . Spine surgery  Oct. 27, 2014    Injection - Back    Allergies  Allergen Reactions  . Aldactone [Spironolactone]     Severe hyperkalemia   .  Crestor [Rosuvastatin Calcium] Other (See Comments)    Muscle Pain  . Lisinopril Other (See Comments)    "Lowers her BP too low" & cough  . Vicodin [Hydrocodone-Acetaminophen] Nausea And Vomiting    Current Outpatient Prescriptions  Medication Sig Dispense Refill  . Acetaminophen (TYLENOL PO) Take by mouth as directed.      Marland Kitchen. aspirin EC 81 MG tablet Take 81 mg by mouth daily.      . carvedilol (COREG) 25 MG tablet Take 25 mg by mouth 2 (two) times daily with a meal.      . clopidogrel (PLAVIX) 75 MG tablet Take 75 mg by mouth daily.      . furosemide (LASIX) 40 MG tablet Take 1 tablet (40 mg total) by mouth 2 (two) times daily.  20 tablet  0  . insulin aspart (NOVOLOG FLEXPEN) 100 UNIT/ML SOPN FlexPen Inject 1-12 Units into the skin 3 (three) times daily with meals. As needed per sliding scale      . insulin glargine (LANTUS) 100 UNIT/ML injection Inject 19 Units into the skin at bedtime.       Marland Kitchen. ipratropium-albuterol (DUONEB) 0.5-2.5 (3) MG/3ML SOLN Take 3 mLs by nebulization 3 (three) times daily.      . irbesartan (AVAPRO) 300 MG tablet Take 1 tablet (300 mg total) by mouth at bedtime.    0  . Multiple Vitamin (MULTIVITAMIN WITH MINERALS) TABS Take 1 tablet by mouth daily.      . nitroGLYCERIN (NITROSTAT) 0.4 MG SL tablet Place 0.4 mg under the tongue every 5 (five) minutes as needed. For chest pain      . OXYGEN-HELIUM IN Inhale into the lungs.      . simvastatin (ZOCOR) 40 MG tablet Take 40 mg by mouth every evening.       . traMADol (ULTRAM) 50 MG tablet Take by mouth every 6 (six) hours as needed.      . [DISCONTINUED] dexlansoprazole (DEXILANT) 60 MG capsule Take 60 mg by mouth daily.       No current facility-administered medications for this visit.    ROS: See HPI for pertinent positives and negatives.   Physical Examination  Filed Vitals:   01/16/14 1438  BP: 177/82  Pulse: 69  Temp: 97.6 F (36.4 C)  Resp: 16   Filed Weights   01/16/14 1438  Weight: 168 lb 9.6  oz (76.476 kg)   Body mass index is 30.83 kg/(m^2).   General: A&O x 3, WDWN, obese female. Gait: slow, halting. Eyes: PERRLA. Pulmonary: CTAB, without wheezes , rales or rhonchi. Cardiac: regular Rythm , without detected  murmur.        Aorta is not palpable. Radial pulses: are 2+ palpable and =                           VASCULAR EXAM: Extremities without ischemic changes  without Gangrene; without open wounds.                                                                                                          LE Pulses LEFT RIGHT       FEMORAL   palpable  not palpable        POPLITEAL  not palpable   not palpable       POSTERIOR TIBIAL   palpable    palpable        DORSALIS PEDIS      ANTERIOR TIBIAL  palpable  not palpable    Abdomen: soft, no masses, mild tenderness to palpation LLQ; skin adhesion from LLQ area panus to left groin. Skin: no rashes, no ulcers noted. Musculoskeletal: no muscle wasting or atrophy.  Neurologic: A&O X 3; Appropriate Affect ; SENSATION: normal; MOTOR FUNCTION:  moving all extremities equally, motor strength 5/5 throughout. Speech is fluent/normal. CN 2-12 intact.   Non-Invasive Vascular Imaging: DATE: 01/16/2014 LOWER EXTREMITY ARTERIAL EVALUATION    INDICATION: Left groin pain    PREVIOUS INTERVENTION(S): Left external iliac artery-posterior tibial artery graft placed 06/11/2013    DUPLEX EXAM: Duplex evaluation of the lower extremity arterial system to include the common femoral, superficial femoral, popliteal, and tibial arteries and bypass graft(s) and/or stent(s) if present.      FINDINGS:                                       RIGHT LOWER EXTREMITY:      LEFT LOWER EXTREMITY:  No evidence of pseudoaneurysm, significant stenosis, or deep venous thrombosis in the left groin. There were no notable lymph nodes.        See the attached diagram for velocities.  IMPRESSION:  Normal vascular study of the left groin.      ASSESSMENT: [name] is a 68 y.o. female who is s/p left external iliac to posterior tibial artery bypass using ipsilateral greater saphenous vein on 06/11/2013. Left femoral limited Duplex found no abnormality of the vasculature.  Since she has known lumbar spine issues, this pain is most likely related to radiculopathy. She was advised to follow up with her PCP re this.    PLAN:  Tramadol 50 mg, 1 tab by moth every 6 hours as needed, #60, 1 refill.  Based on the patient's vascular studies and examination, and after discussing with Dr. Darrick Penna,  pt will return to clinic in 2 months as already scheduled at last month visit.  The patient was given information about PAD including signs, symptoms, treatment, what symptoms should prompt the patient to seek immediate medical care, and risk reduction measures to  take.  Charisse March, RN, MSN, FNP-C [facility] Phone: [phone]  Clinic MD: Darrick Penna  01/16/2014 2:18 PM

## 2014-03-05 ENCOUNTER — Encounter: Payer: Self-pay | Admitting: Family

## 2014-03-06 ENCOUNTER — Encounter: Payer: Self-pay | Admitting: Family

## 2014-03-06 ENCOUNTER — Ambulatory Visit (HOSPITAL_COMMUNITY)
Admission: RE | Admit: 2014-03-06 | Discharge: 2014-03-06 | Disposition: A | Discharge status: Home / Self Care | Payer: Medicare Other | Source: Ambulatory Visit | Attending: Family | Admitting: Family

## 2014-03-06 ENCOUNTER — Ambulatory Visit (INDEPENDENT_AMBULATORY_CARE_PROVIDER_SITE_OTHER)
Admission: RE | Admit: 2014-03-06 | Discharge: 2014-03-06 | Disposition: A | Discharge status: Home / Self Care | Payer: Medicare Other | Source: Ambulatory Visit | Attending: Family | Admitting: Family

## 2014-03-06 ENCOUNTER — Ambulatory Visit (INDEPENDENT_AMBULATORY_CARE_PROVIDER_SITE_OTHER): Payer: Medicare Other | Admitting: Family

## 2014-03-06 VITALS — BP 191/89 | HR 72 | Resp 16 | Ht 62.0 in | Wt 167.0 lb

## 2014-03-06 DIAGNOSIS — I6529 Occlusion and stenosis of unspecified carotid artery: Secondary | ICD-10-CM

## 2014-03-06 DIAGNOSIS — Z48812 Encounter for surgical aftercare following surgery on the circulatory system: Secondary | ICD-10-CM

## 2014-03-06 DIAGNOSIS — I739 Peripheral vascular disease, unspecified: Secondary | ICD-10-CM

## 2014-03-06 DIAGNOSIS — L24A9 Irritant contact dermatitis due friction or contact with other specified body fluids: Secondary | ICD-10-CM

## 2014-03-06 DIAGNOSIS — T148XXA Other injury of unspecified body region, initial encounter: Secondary | ICD-10-CM | POA: Insufficient documentation

## 2014-03-06 NOTE — Progress Notes (Signed)
[facility] [place] HISTORY AND PHYSICAL -PAD  History of Present Illness [name] is a 68 y.o. female patient of Dr. Darrick Penna who is s/p left external iliac to posterior tibial artery bypass using ipsilateral greater saphenous vein on 06/11/2013; this resolved the rest pain in her left foot.  She is also s/p right femoral to above knee popliteal bypass graft and right distal popliteal to posterior tibial artery bypass graft about 2005. She returns today for 3 month surveillance.   Has lumbar spine issues causing bilateral sciatic pain, is getting ESI's by Dr. Ethelene Hal.  Her left foot remains pain free since the vascular BPG with good color, denies rest pain, denies non-healing ulcers.  Has a trach.for the past 2 years since she was intubated for pneumonia due to tracheal swelling, managed by her ENT; pt states Dr. Jenne Pane told her that she may be able to have the trach removed soon.  Has CHF, had an MI in 2005.  Had left CEA in 2005, states TIA before that, no TIA sx's since then, has not had carotid Duplex since then, CEA was done in [place].  When she awakes in the morning, both thighs are hurting, pain in thighs increase with walking, feels like burning at anterior aspects both thighs, improves with rest.  She walks a great deal in her house, denies non healing wounds.  Recent finding is fracture in lumbar spine.  Patient denies New Medical or Surgical History other than her cardiologist increasing the dose of her diuretic.  Pt reports that she is walking much more and feels much better mentally and physically. She is wearing knee high graduated compression hose during the day for lower leg swelling which has decreased the swelling.  Pt Diabetic: Yes, states in control  Pt smoker: former smoker, quit in 2013 Pt meds include:  Statin :Yes  Betablocker: Yes  ASA: Yes  Other anticoagulants/antiplatelets: Plavix   Past Medical History  Diagnosis Date  . Diabetes  mellitus     on Lantus 30 U   . CAD (coronary artery disease)     S/p CABG and stenting  . PAD (peripheral artery disease)   . Stroke     MRI 11/2011 with remote occipital lobe. MRA with moderate left focal vertebral artery stenosis  . CHF (congestive heart failure) 11/2011    Echo with EF 30-35%, global hypokinesis, and inferior akinesis  . Hyperlipidemia   . DDD (degenerative disc disease), lumbar   . CVA (cerebral vascular accident) 11/2010  . MI (myocardial infarction) 1997  . COPD (chronic obstructive pulmonary disease)   . Anemia   . Irregular heart beat   . Hypertension   . GERD (gastroesophageal reflux disease)   . History of IBS   . Hypothyroidism     Goiter  . Thyroid disease   . Carotid artery occlusion   . Chronic kidney disease     stage 3  . History of tracheostomy 08/26/2013    Dr. Jenne Pane    Social History History  Substance Use Topics  . Smoking status: Former Smoker -- 1.00 packs/day for 50 years    Types: Cigarettes    Quit date: 01/24/2011  . Smokeless tobacco: Never Used  . Alcohol Use: No    Family History Family History  Problem Relation Age of Onset  . Hyperlipidemia Mother   . Other Mother     AAA  . Alzheimer's disease Mother   . Heart disease Mother   . Irregular  heart beat Mother   . Diabetes Daughter   . Hypertension Daughter     Past Surgical History  Procedure Laterality Date  . Ptca    . Thyroidectomy    . Coronary artery bypass graft      2 vessel  . Carotid endarterectomy  ~2008    Left   . Cholecystectomy    . Tracheostomy tube placement  01/02/2012  . Angioplasty  [id number]    Aortogram by Dr. [facility])  . Pr vein bypass graft,aorto-fem-pop      Right common femoral-AK popliteal BPG & Right Popliteal-posterior tibial  . Pr vein bypass graft,aorto-fem-pop      Left Fem-pop BPG  . Carpal tunnel release Right   . Femoral-tibial bypass graft Left 06/11/2013    Procedure: BYPASS GRAFT  LEFT FEMORAL-  POSTERIOR TIBIAL ARTERY/ REDO;  Surgeon: Sherren Kernsharles E Fields, MD;  [facility];  Service: Vascular;  Laterality: Left;  . Thrombectomy femoral artery Left 06/11/2013    Procedure: THROMBECTOMY FEMORAL ARTERY;  Surgeon: Sherren Kernsharles E Fields, MD;  [facility];  Service: Vascular;  Laterality: Left;  . Spine surgery  Oct. 27, 2014    Injection - Back    Allergies  Allergen Reactions  . Aldactone [Spironolactone]     Severe hyperkalemia   . Crestor [Rosuvastatin Calcium] Other (See Comments)    Muscle Pain  . Lisinopril Other (See Comments)    "Lowers her BP too low" & cough  . Vicodin [Hydrocodone-Acetaminophen] Nausea And Vomiting    Current Outpatient Prescriptions  Medication Sig Dispense Refill  . Acetaminophen (TYLENOL PO) Take by mouth as directed.      Marland Kitchen. aspirin EC 81 MG tablet Take 81 mg by mouth daily.      . carvedilol (COREG) 25 MG tablet Take 25 mg by mouth 2 (two) times daily with a meal.      . clopidogrel (PLAVIX) 75 MG tablet Take 75 mg by mouth daily.      . furosemide (LASIX) 40 MG tablet Take 1 tablet (40 mg total) by mouth 2 (two) times daily.  20 tablet  0  . insulin aspart (NOVOLOG FLEXPEN) 100 UNIT/ML SOPN FlexPen Inject 1-12 Units into the skin 3 (three) times daily with meals. As needed per sliding scale      . insulin glargine (LANTUS) 100 UNIT/ML injection Inject 19 Units into the skin at bedtime.       Marland Kitchen. ipratropium-albuterol (DUONEB) 0.5-2.5 (3) MG/3ML SOLN Take 3 mLs by nebulization 3 (three) times daily.      . irbesartan (AVAPRO) 300 MG tablet Take 1 tablet (300 mg total) by mouth at bedtime.    0  . Multiple Vitamin (MULTIVITAMIN WITH MINERALS) TABS Take 1 tablet by mouth daily.      . nitroGLYCERIN (NITROSTAT) 0.4 MG SL tablet Place 0.4 mg under the tongue every 5 (five) minutes as needed. For chest pain      . OXYGEN-HELIUM IN Inhale into the lungs.      . simvastatin (ZOCOR) 40 MG tablet Take 40 mg by mouth every evening.       . traMADol (ULTRAM) 50  MG tablet Take 1 tablet (50 mg total) by mouth every 6 (six) hours as needed.  60 tablet  1  . [DISCONTINUED] dexlansoprazole (DEXILANT) 60 MG capsule Take 60 mg by mouth daily.       No current facility-administered medications for this visit.    ROS: See HPI for pertinent positives  and negatives.   Physical Examination  Filed Vitals:   03/06/14 1125  BP: 191/89  Pulse: 72  Resp: 16  Height: 5\' 2"  (1.575 m)  Weight: 167 lb (75.751 kg)  SpO2: 100%  Body mass index is 30.54 kg/(m^2).   General: A&O x 3, WDWN, obese female.  Gait: slow, halting.  Eyes: PERRLA.  Pulmonary: CTAB, without wheezes , rales or rhonchi.  Cardiac: regular Rythm , without detected murmur.   Aorta is not palpable.  Radial pulses: are 2+ palpable and =   VASCULAR EXAM:  Extremities without ischemic changes  without Gangrene; without open wounds. Pretibial pitting edema: trace right, 1+ left.  LE Pulses  LEFT  RIGHT   FEMORAL  palpable  not palpable   POPLITEAL  not palpable  not palpable   POSTERIOR TIBIAL  1+palpable  Not palpable   DORSALIS PEDIS  ANTERIOR TIBIAL  1+palpable  1+palpable    Abdomen: soft, no masses, mild tenderness to palpation LLQ; skin adhesion from LLQ area panus to left groin.  Skin: no rashes, no ulcers noted.  Musculoskeletal: no muscle wasting or atrophy.  Neurologic: A&O X 3; Appropriate Affect ; SENSATION: normal; MOTOR FUNCTION: moving all extremities equally, motor strength 5/5 throughout. Speech is fluent/normal. CN 2-12 intact.    Non-Invasive Vascular Imaging: DATE: 03/06/2014 LOWER EXTREMITY ARTERIAL DUPLEX EVALUATION    INDICATION: Evaluation of bypass grafts.    PREVIOUS INTERVENTION(S): Right femoral to above knee popliteal bypass graft approximately 10 years ago. Right distal popliteal to posterior tibial artery bypass graft approximately 10 years ago. Left external iliac artery to posterior tibial artery bypass graft 06/11/2013.    DUPLEX EXAM:      RIGHT  LEFT   Peak Systolic Velocity (cm/s) Ratio (if abnormal) Waveform  Peak Systolic Velocity (cm/s) Ratio (if abnormal) Waveform  130  B Inflow Artery 62  B   157  B Proximal Anastomosis 55  B  52  B Proximal Graft 28  B  26  M Mid Graft 43  B  24  M  Distal Graft 35  B  157 6.0 B Distal Anastomosis 72  B  51  B  Outflow Artery 59  B  0.96 Today's ABI / TBI 0.91  0.82 Previous ABI / TBI (12/05/2013  ) 0.82    Waveform:    M - Monophasic       B - Biphasic       T - Triphasic  If Ankle Brachial Index (ABI) or Toe Brachial Index (TBI) performed, please see complete report     ADDITIONAL FINDINGS:     IMPRESSION: Patent right femoral to above knee popliteal bypass graft without evidence of stenosis. Patent right distal popliteal to posterior tibial artery bypass graft with a stenosis noted at the distal anastomosis. Patent left external iliac artery to posterior tibial artery bypass graft with no hemodynamically significant stenosis noted; Biphasic/monophasic waveforms noted throughout may be indicative of more proximal stenosis. Comparison to the previous exam noted on page two of this report.     Compared to the previous exam:  Stable bilateral lower extremity bypass grafts compared to the last exam on 12/05/2013.     ASSESSMENT: Scot JunDorothy I Gorsline is a 68 y.o. female who is s/p left external iliac to posterior tibial artery bypass using ipsilateral greater saphenous vein on 06/11/2013; this resolved the rest pain in her left foot.  She is also s/p right femoral to above knee popliteal bypass graft and right distal  popliteal to posterior tibial artery bypass graft about 2005. She is also s/p left CEA in 2005, states TIA before that, no TIA sx's since then, has not had carotid Duplex since then, CEA was done in [place].  She has no claudication symptoms with walking, has no non healing wounds, her DM is in good control and she stopped smoking in 2013. She has increased her walking and  her ABI's reflect this as improved from the previous ABI's, both legs improved to no evidence of arterial occlusive disease from mild arterial occlusive disease in both legs. Patent right femoral to above knee popliteal bypass graft without evidence of stenosis. Patent right distal popliteal to posterior tibial artery bypass graft with a stenosis noted at the distal anastomosis. Patent left external iliac artery to posterior tibial artery bypass graft with no hemodynamically significant stenosis noted; Biphasic/monophasic waveforms noted throughout may be indicative of more proximal stenosis. Stable bilateral lower extremity bypass grafts compared to the last exam on 12/05/2013. Will add carotid Duplex in 3 months since she is s/p left CEA about 2005 in [place], had a TIA prior to that, no further stroke or TIA activity.   PLAN:  I discussed in depth with the patient the nature of atherosclerosis, and emphasized the importance of maximal medical management including strict control of blood pressure, blood glucose, and lipid levels, obtaining regular exercise, and continued cessation of smoking.  The patient is aware that without maximal medical management the underlying atherosclerotic disease process will progress, limiting the benefit of any interventions.  Based on the patient's vascular studies and examination, pt will return to clinic in 3 months with left LE arterial Duplex and ABI's; if stable in 3 months will check every 6 months for the following year, then annually.Will add carotid Duplex in 3 months since she is s/p left CEA about 2005 in [place], had a TIA prior to that, no further stroke or TIA activity.  The patient was given information about PAD including signs, symptoms, treatment, what symptoms should prompt the patient to seek immediate medical care, and risk reduction measures to take.  Charisse March, RN, MSN, FNP-C [facility] Phone:  [phone]  Clinic MD: [facility]  03/06/2014 11:27 AM

## 2014-03-06 NOTE — Patient Instructions (Addendum)
Peripheral Vascular Disease Peripheral Vascular Disease (PVD), also called Peripheral Arterial Disease (PAD), is a circulation problem caused by cholesterol (atherosclerotic plaque) deposits in the arteries. PVD commonly occurs in the lower extremities (legs) but it can occur in other areas of the body, such as your arms. The cholesterol buildup in the arteries reduces blood flow which can cause pain and other serious problems. The presence of PVD can place a person at risk for Coronary Artery Disease (CAD).  CAUSES  Causes of PVD can be many. It is usually associated with more than one risk factor such as:   High Cholesterol.  Smoking.  Diabetes.  Lack of exercise or inactivity.  High blood pressure (hypertension).  Obesity.  Family history. SYMPTOMS   When the lower extremities are affected, patients with PVD may experience:  Leg pain with exertion or physical activity. This is called INTERMITTENT CLAUDICATION. This may present as cramping or numbness with physical activity. The location of the pain is associated with the level of blockage. For example, blockage at the abdominal level (distal abdominal aorta) may result in buttock or hip pain. Lower leg arterial blockage may result in calf pain.  As PVD becomes more severe, pain can develop with less physical activity.  In people with severe PVD, leg pain may occur at rest.  Other PVD signs and symptoms:  Leg numbness or weakness.  Coldness in the affected leg or foot, especially when compared to the other leg.  A change in leg color.  Patients with significant PVD are more prone to ulcers or sores on toes, feet or legs. These may take longer to heal or may reoccur. The ulcers or sores can become infected.  If signs and symptoms of PVD are ignored, gangrene may occur. This can result in the loss of toes or loss of an entire limb.  Not all leg pain is related to PVD. Other medical conditions can cause leg pain such  as:  Blood clots (embolism) or Deep Vein Thrombosis.  Inflammation of the blood vessels (vasculitis).  Spinal stenosis. DIAGNOSIS  Diagnosis of PVD can involve several different types of tests. These can include:  Pulse Volume Recording Method (PVR). This test is simple, painless and does not involve the use of X-rays. PVR involves measuring and comparing the blood pressure in the arms and legs. An ABI (Ankle-Brachial Index) is calculated. The normal ratio of blood pressures is 1. As this number becomes smaller, it indicates more severe disease.  < 0.95  indicates significant narrowing in one or more leg vessels.  <0.8 there will usually be pain in the foot, leg or buttock with exercise.  <0.4 will usually have pain in the legs at rest.  <0.25  usually indicates limb threatening PVD.  Doppler detection of pulses in the legs. This test is painless and checks to see if you have a pulses in your legs/feet.  A dye or contrast material (a substance that highlights the blood vessels so they show up on x-ray) may be given to help your caregiver better see the arteries for the following tests. The dye is eliminated from your body by the kidney's. Your caregiver may order blood work to check your kidney function and other laboratory values before the following tests are performed:  Magnetic Resonance Angiography (MRA). An MRA is a picture study of the blood vessels and arteries. The MRA machine uses a large magnet to produce images of the blood vessels.  Computed Tomography Angiography (CTA). A CTA is a   specialized x-ray that looks at how the blood flows in your blood vessels. An IV may be inserted into your arm so contrast dye can be injected.  Angiogram. Is a procedure that uses x-rays to look at your blood vessels. This procedure is minimally invasive, meaning a small incision (cut) is made in your groin. A small tube (catheter) is then inserted into the artery of your groin. The catheter is  guided to the blood vessel or artery your caregiver wants to examine. Contrast dye is injected into the catheter. X-rays are then taken of the blood vessel or artery. After the images are obtained, the catheter is taken out. TREATMENT  Treatment of PVD involves many interventions which may include:  Lifestyle changes:  Quitting smoking.  Exercise.  Following a low fat, low cholesterol diet.  Control of diabetes.  Foot care is very important to the PVD patient. Good foot care can help prevent infection.  Medication:  Cholesterol-lowering medicine.  Blood pressure medicine.  Anti-platelet drugs.  Certain medicines may reduce symptoms of Intermittent Claudication.  Interventional/Surgical options:  Angioplasty. An Angioplasty is a procedure that inflates a balloon in the blocked artery. This opens the blocked artery to improve blood flow.  Stent Implant. A wire mesh tube (stent) is placed in the artery. The stent expands and stays in place, allowing the artery to remain open.  Peripheral Bypass Surgery. This is a surgical procedure that reroutes the blood around a blocked artery to help improve blood flow. This type of procedure may be performed if Angioplasty or stent implants are not an option. SEEK IMMEDIATE MEDICAL CARE IF:   You develop pain or numbness in your arms or legs.  Your arm or leg turns cold, becomes blue in color.  You develop redness, warmth, swelling and pain in your arms or legs. MAKE SURE YOU:   Understand these instructions.  Will watch your condition.  Will get help right away if you are not doing well or get worse. Document Released: 11/17/2004 Document Revised: 01/02/2012 Document Reviewed: 10/14/2008 Exit[employer] Patient Information 2014 [employer].   Stroke Prevention Some medical conditions and behaviors are associated with an increased chance of having a stroke. You may prevent a stroke by making healthy choices and managing medical  conditions. HOW CAN I REDUCE MY RISK OF HAVING A STROKE?   Stay physically active. Get at least 30 minutes of activity on most or all days.  Do not smoke. It may also be helpful to avoid exposure to secondhand smoke.  Limit alcohol use. Moderate alcohol use is considered to be:  No more than 2 drinks per day for men.  No more than 1 drink per day for nonpregnant women.  Eat healthy foods. This involves  Eating 5 or more servings of fruits and vegetables a day.  Following a diet that addresses high blood pressure (hypertension), high cholesterol, diabetes, or obesity.  Manage your cholesterol levels.  A diet low in saturated fat, trans fat, and cholesterol and high in fiber may control cholesterol levels.  Take any prescribed medicines to control cholesterol as directed by your health care provider.  Manage your diabetes.  A controlled-carbohydrate, controlled-sugar diet is recommended to manage diabetes.  Take any prescribed medicines to control diabetes as directed by your health care provider.  Control your hypertension.  A low-salt (sodium), low-saturated fat, low-trans fat, and low-cholesterol diet is recommended to manage hypertension.  Take any prescribed medicines to control hypertension as directed by your health care provider.    Maintain a healthy weight.  A reduced-calorie, low-sodium, low-saturated fat, low-trans fat, low-cholesterol diet is recommended to manage weight.  Stop drug abuse.  Avoid taking birth control pills.  Talk to your health care provider about the risks of taking birth control pills if you are over 35 years old, smoke, get migraines, or have ever had a blood clot.  Get evaluated for sleep disorders (sleep apnea).  Talk to your health care provider about getting a sleep evaluation if you snore a lot or have excessive sleepiness.  Take medicines as directed by your health care provider.  For some people, aspirin or blood thinners  (anticoagulants) are helpful in reducing the risk of forming abnormal blood clots that can lead to stroke. If you have the irregular heart rhythm of atrial fibrillation, you should be on a blood thinner unless there is a good reason you cannot take them.  Understand all your medicine instructions.  Make sure that other other conditions (such as anemia or atherosclerosis) are addressed. SEEK IMMEDIATE MEDICAL CARE IF:   You have sudden weakness or numbness of the face, arm, or leg, especially on one side of the body.  Your face or eyelid droops to one side.  You have sudden confusion.  You have trouble speaking (aphasia) or understanding.  You have sudden trouble seeing in one or both eyes.  You have sudden trouble walking.  You have dizziness.  You have a loss of balance or coordination.  You have a sudden, severe headache with no known cause.  You have new chest pain or an irregular heartbeat. Any of these symptoms may represent a serious problem that is an emergency. Do not wait to see if the symptoms will go away. Get medical help at once. Call your local emergency services  (911 in [place] Do not drive yourself to the hospital. Document Released: 11/17/2004 Document Revised: 07/31/2013 Document Reviewed: 04/12/2013 Exit[employer] Patient Information 2014 [employer].  

## 2014-03-06 NOTE — Addendum Note (Signed)
Addended by: Sharee PimpleMCCHESNEY, MARILYN K on: 03/06/2014 02:50 PM   Modules accepted: Orders

## 2014-03-27 ENCOUNTER — Other Ambulatory Visit: Payer: Self-pay | Admitting: Internal Medicine

## 2014-03-27 DIAGNOSIS — Z1231 Encounter for screening mammogram for malignant neoplasm of breast: Secondary | ICD-10-CM

## 2014-04-02 ENCOUNTER — Ambulatory Visit (INDEPENDENT_AMBULATORY_CARE_PROVIDER_SITE_OTHER): Payer: Medicare Other | Admitting: Podiatry

## 2014-04-02 DIAGNOSIS — M79609 Pain in unspecified limb: Secondary | ICD-10-CM

## 2014-04-02 DIAGNOSIS — B351 Tinea unguium: Secondary | ICD-10-CM

## 2014-04-02 NOTE — Progress Notes (Signed)
   Subjective:    Patient ID: [name], female    DOB: 07-Aug-1946, 68 y.o.   MRN: [id number]  HPI  Routine debride Pain outer region of both feet, lasting 2 days, worsens when walking, denies bilaterally calf pain. Pain in left calf from previous surgery.   Review of Systems  All other systems reviewed and are negative.      Objective:   Physical Exam        Assessment & Plan:

## 2014-04-03 NOTE — Progress Notes (Signed)
Subjective:     Patient ID: [name], female   DOB: [birth year], 68 y.o.   MRN: [id number]  HPI patient presents with thick toenails 1-5 both feet that are painful and she cannot cut   Review of Systems     Objective:   Physical Exam Neurovascular status intact with thick yellow brittle nailbeds 1-5 both feet    Assessment:     Mycotic nail infection with pain 1-5 both feet    Plan:      debris painful nailbeds 1-5 both feet with no iatrogenic bleeding noted

## 2014-04-09 ENCOUNTER — Ambulatory Visit
Admission: RE | Admit: 2014-04-09 | Discharge: 2014-04-09 | Disposition: A | Discharge status: Home / Self Care | Payer: Medicare Other | Source: Ambulatory Visit | Attending: Internal Medicine | Admitting: Internal Medicine

## 2014-04-09 DIAGNOSIS — Z1231 Encounter for screening mammogram for malignant neoplasm of breast: Secondary | ICD-10-CM

## 2014-04-10 ENCOUNTER — Other Ambulatory Visit: Payer: Self-pay | Admitting: Internal Medicine

## 2014-04-10 DIAGNOSIS — R928 Other abnormal and inconclusive findings on diagnostic imaging of breast: Secondary | ICD-10-CM

## 2014-04-17 ENCOUNTER — Ambulatory Visit
Admission: RE | Admit: 2014-04-17 | Discharge: 2014-04-17 | Disposition: A | Discharge status: Home / Self Care | Payer: Medicare Other | Source: Ambulatory Visit | Attending: Internal Medicine | Admitting: Internal Medicine

## 2014-04-17 DIAGNOSIS — R928 Other abnormal and inconclusive findings on diagnostic imaging of breast: Secondary | ICD-10-CM

## 2014-06-19 ENCOUNTER — Ambulatory Visit: Payer: Medicare Other | Admitting: Family

## 2014-06-19 ENCOUNTER — Encounter (HOSPITAL_COMMUNITY): Payer: Medicare Other

## 2014-06-19 ENCOUNTER — Other Ambulatory Visit (HOSPITAL_COMMUNITY): Payer: Medicare Other

## 2014-07-09 ENCOUNTER — Ambulatory Visit (INDEPENDENT_AMBULATORY_CARE_PROVIDER_SITE_OTHER): Payer: Medicare Other | Admitting: Podiatry

## 2014-07-09 ENCOUNTER — Encounter: Payer: Self-pay | Admitting: Podiatry

## 2014-07-09 VITALS — BP 110/68 | HR 74 | Resp 17

## 2014-07-09 DIAGNOSIS — M79609 Pain in unspecified limb: Secondary | ICD-10-CM

## 2014-07-09 DIAGNOSIS — G589 Mononeuropathy, unspecified: Secondary | ICD-10-CM

## 2014-07-09 DIAGNOSIS — E1149 Type 2 diabetes mellitus with other diabetic neurological complication: Secondary | ICD-10-CM

## 2014-07-09 DIAGNOSIS — M79673 Pain in unspecified foot: Secondary | ICD-10-CM

## 2014-07-09 DIAGNOSIS — I6529 Occlusion and stenosis of unspecified carotid artery: Secondary | ICD-10-CM

## 2014-07-09 DIAGNOSIS — B351 Tinea unguium: Secondary | ICD-10-CM

## 2014-07-09 MED ORDER — GABAPENTIN 300 MG PO CAPS: 300.0000 mg | ORAL_CAPSULE | Freq: Two times a day (BID) | ORAL | Status: DC

## 2014-07-09 NOTE — Progress Notes (Signed)
   Subjective:    Patient ID: [name], female    DOB: 07/21/1946, 68 y.o.   MRN: [id number]  HPI  Pt presents for nail debridment, als c/o burning foot pain bilateral at night  Review of Systems     Objective:   Physical Exam        Assessment & Plan:

## 2014-07-11 NOTE — Progress Notes (Signed)
Subjective:     Patient ID: [name], female   DOB: 01/13/1946, 68 y.o.   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      

## 2014-07-17 ENCOUNTER — Encounter: Payer: Self-pay | Admitting: Cardiology

## 2014-07-17 ENCOUNTER — Encounter: Payer: Self-pay | Admitting: Family

## 2014-07-17 ENCOUNTER — Ambulatory Visit (INDEPENDENT_AMBULATORY_CARE_PROVIDER_SITE_OTHER): Payer: Medicare Other | Admitting: Cardiology

## 2014-07-17 ENCOUNTER — Other Ambulatory Visit: Payer: Self-pay | Admitting: *Deleted

## 2014-07-17 VITALS — BP 136/72 | HR 67 | Ht 62.0 in | Wt 174.0 lb

## 2014-07-17 DIAGNOSIS — I25810 Atherosclerosis of coronary artery bypass graft(s) without angina pectoris: Secondary | ICD-10-CM

## 2014-07-17 DIAGNOSIS — R0602 Shortness of breath: Secondary | ICD-10-CM

## 2014-07-17 DIAGNOSIS — I252 Old myocardial infarction: Secondary | ICD-10-CM

## 2014-07-17 DIAGNOSIS — J449 Chronic obstructive pulmonary disease, unspecified: Secondary | ICD-10-CM | POA: Insufficient documentation

## 2014-07-17 DIAGNOSIS — J4489 Other specified chronic obstructive pulmonary disease: Secondary | ICD-10-CM | POA: Insufficient documentation

## 2014-07-17 DIAGNOSIS — Z930 Tracheostomy status: Secondary | ICD-10-CM

## 2014-07-17 DIAGNOSIS — I6529 Occlusion and stenosis of unspecified carotid artery: Secondary | ICD-10-CM

## 2014-07-17 DIAGNOSIS — I5022 Chronic systolic (congestive) heart failure: Secondary | ICD-10-CM

## 2014-07-17 MED ORDER — FUROSEMIDE 80 MG PO TABS: 80.0000 mg | ORAL_TABLET | Freq: Two times a day (BID) | ORAL | Status: DC | PRN

## 2014-07-17 NOTE — Progress Notes (Signed)
[address] [place], [place]  [address] Phone: [phone] Fax:  [phone]  Date:  07/17/2014   ID:  [name], DOB 1945-12-08, MRN [id number]  PCP:  Cala Bradford, MD   History of Present Illness: [name] is a 68 y.o. female  discharged on 01/04/13 with COPD, chronic systolic congestive heart failure with EF of 20-25%. She has tracheostomy, placed by Dr. Jenne Pane. She has been on chronic oxygen therapy secondary to COPD as well as systolic heart failure.  Overall, she is feeling fairly well, no shortness of breath. She has gained some weight recently. She does feel some increased lower extremity edema.   Left LE bypass, Dr. Darrick Penna. Plavix.   Overall she is quite happy.    Wt Readings from Last 3 Encounters:  07/17/14 174 lb (78.926 kg)  03/06/14 167 lb (75.751 kg)  01/16/14 168 lb 9.6 oz (76.476 kg)     Past Medical History  Diagnosis Date  . Diabetes mellitus     on Lantus 30 U   . CAD (coronary artery disease)     S/p CABG and stenting  . PAD (peripheral artery disease)   . Stroke     MRI 11/2011 with remote occipital lobe. MRA with moderate left focal vertebral artery stenosis  . CHF (congestive heart failure) 11/2011    Echo with EF 30-35%, global hypokinesis, and inferior akinesis  . Hyperlipidemia   . DDD (degenerative disc disease), lumbar   . CVA (cerebral vascular accident) 11/2010  . MI (myocardial infarction) 1997  . COPD (chronic obstructive pulmonary disease)   . Anemia   . Irregular heart beat   . Hypertension   . GERD (gastroesophageal reflux disease)   . History of IBS   . Hypothyroidism     Goiter  . Thyroid disease   . Carotid artery occlusion   . Chronic kidney disease     stage 3  . History of tracheostomy 08/26/2013    Dr. Jenne Pane    Past Surgical History  Procedure Laterality Date  . Ptca    . Thyroidectomy    . Coronary artery bypass graft      2 vessel  . Carotid endarterectomy  ~2008    Left     . Cholecystectomy    . Tracheostomy tube placement  01/02/2012  . Angioplasty  [id number]    Aortogram by Dr. [place] McKenzie [facility])  . Pr vein bypass graft,aorto-fem-pop      Right common femoral-AK popliteal BPG & Right Popliteal-posterior tibial  . Pr vein bypass graft,aorto-fem-pop      Left Fem-pop BPG  . Carpal tunnel release Right   . Femoral-tibial bypass graft Left 06/11/2013    Procedure: BYPASS GRAFT  LEFT FEMORAL- POSTERIOR TIBIAL ARTERY/ REDO;  Surgeon: Sherren Kerns, MD;  Location: [facility] OR;  Service: Vascular;  Laterality: Left;  . Thrombectomy femoral artery Left 06/11/2013    Procedure: THROMBECTOMY FEMORAL ARTERY;  Surgeon: Sherren Kerns, MD;  Location: [facility] OR;  Service: Vascular;  Laterality: Left;  . Spine surgery  Oct. 27, 2014    Injection - Back    Current Outpatient Prescriptions  Medication Sig Dispense Refill  . Acetaminophen (TYLENOL PO) Take by mouth as directed.      Marland Kitchen aspirin EC 81 MG tablet Take 81 mg by mouth daily.      . carvedilol (COREG) 25 MG tablet Take 25 mg by mouth 2 (two) times daily  with a meal.      . clopidogrel (PLAVIX) 75 MG tablet Take 75 mg by mouth daily.      . DULoxetine (CYMBALTA) 20 MG capsule Take 20 mg by mouth daily.      . furosemide (LASIX) 40 MG tablet Take 1 tablet (40 mg total) by mouth 2 (two) times daily.  20 tablet  0  . gabapentin (NEURONTIN) 300 MG capsule Take 1 capsule (300 mg total) by mouth 2 (two) times daily.  90 capsule  3  . HYDROcodone-acetaminophen (NORCO) 10-325 MG per tablet Take 1 tablet by mouth every 6 (six) hours as needed.      . insulin aspart (NOVOLOG FLEXPEN) 100 UNIT/ML SOPN FlexPen Inject 1-12 Units into the skin 3 (three) times daily with meals. As needed per sliding scale      . Insulin Glargine (TOUJEO SOLOSTAR) 300 UNIT/ML SOPN Inject 26 Units into the skin at bedtime.      Marland Kitchen ipratropium-albuterol (DUONEB) 0.5-2.5 (3) MG/3ML SOLN Take 3 mLs by nebulization 3 (three) times daily.      .  irbesartan (AVAPRO) 300 MG tablet Take 1 tablet (300 mg total) by mouth at bedtime.    0  . Multiple Vitamin (MULTIVITAMIN WITH MINERALS) TABS Take 1 tablet by mouth daily.      . nitroGLYCERIN (NITROSTAT) 0.4 MG SL tablet Place 0.4 mg under the tongue every 5 (five) minutes as needed. For chest pain      . OXYGEN-HELIUM IN Inhale into the lungs.      . simvastatin (ZOCOR) 40 MG tablet Take 40 mg by mouth every evening.       . insulin glargine (LANTUS) 100 UNIT/ML injection Inject 19 Units into the skin at bedtime.       . traMADol (ULTRAM) 50 MG tablet Take 1 tablet (50 mg total) by mouth every 6 (six) hours as needed.  60 tablet  1  . [DISCONTINUED] dexlansoprazole (DEXILANT) 60 MG capsule Take 60 mg by mouth daily.       No current facility-administered medications for this visit.    Allergies:    Allergies  Allergen Reactions  . Aldactone [Spironolactone]     Severe hyperkalemia   . Crestor [Rosuvastatin Calcium] Other (See Comments)    Muscle Pain  . Lisinopril Other (See Comments)    "Lowers her BP too low" & cough  . Vicodin [Hydrocodone-Acetaminophen] Nausea And Vomiting    Social History:  The patient  reports that she quit smoking about 3 years ago. Her smoking use included Cigarettes. She has a 50 pack-year smoking history. She has never used smokeless tobacco. She reports that she does not drink alcohol or use illicit drugs.   ROS:  Please see the history of present illness.   Denies any lower extremity pain, recent left lower extremity bypass, no fevers, no chills, no cough, no syncope, no significant orthopnea. Occasional shortness of breath.    PHYSICAL EXAM: VS:  BP 136/72  Pulse 67  Ht  (1.575 m)  Wt 174 lb (78.926 kg)  BMI 31.82 kg/m2 Well nourished, well developed, in no acute distress HEENT: Tracheostomy noted midline. Neck: no JVD Cardiac:  normal S1, S2; RRR; no murmur Lungs:  clear to auscultation bilaterally, no wheezing, rhonchi or rales Abd:  soft, nontender, no hepatomegaly Ext: 1+ BL edema, left dimishished pulsed,  Skin: warm and dry Neuro: no focal abnormalities noted  Labs: 09/11/13-Sodium 139, potassium 4.4, creatinine 1.2 ECG: 07/17/14-sinus rhythm, first degree AV  block, 240 ms PR interval, poor R wave progression, nonspecific ST-T wave changes, QTC 498  ASSESSMENT AND PLAN:  1. Chronic systolic heart failure-EF 25%. She has been tolerating Lasix twice a day well. She may take in the morning and early afternoon. It does not have to be every 12 hours. Mild edema now in lower extremities. For the next 3 days take 80 mg of Lasix twice a day.  2. CAD-status post bypass surgery. Overall doing well. No anginal symptoms. 3. Old myocardial infarction-1997. 4. Status post tracheostomy-Dr. Jenne Pane. Subglottis stenosis with chronic tracheostomy 5. Chronic anemia-mild. 6. Home oxygen-here in clinic, 92% oxygen saturation on room air resting, 97% when walking. She is concerned that she desaturates during the night and may need oxygen. I will order advanced home care to perform overnight pulse oximetry on room air. If cumulative oxygen desaturation less than 88% for greater than 5 minutes, she should wear oxygen at night. I appreciate phone discussion with pulmonary colleague. 7. Hypertension-currently well controlled. 8. Peripheral vascular disease-as described above. Recent left lower extremity bypass. Dr Darrick Penna 9. COPD-could also be playing a role in her shortness of breath. On home O2.  10. 6 month follow up  Signed, Donato Schultz, MD [facility]  07/17/2014 12:28 PM

## 2014-07-17 NOTE — Patient Instructions (Addendum)
Please increase Lasix to 80 mg twice a day for 3 days the return to normal dose.  Continue all other medications as listed.  Follow up in 6 months with Dr. Anne Fu.  You will receive a letter in the mail 2 months before you are due.  Please call us when you receive this letter to schedule your follow up appointment.

## 2014-07-18 ENCOUNTER — Ambulatory Visit (INDEPENDENT_AMBULATORY_CARE_PROVIDER_SITE_OTHER)
Admission: RE | Admit: 2014-07-18 | Discharge: 2014-07-18 | Disposition: A | Discharge status: Home / Self Care | Payer: Medicare Other | Source: Ambulatory Visit | Attending: Family | Admitting: Family

## 2014-07-18 ENCOUNTER — Ambulatory Visit (HOSPITAL_COMMUNITY)
Admission: RE | Admit: 2014-07-18 | Discharge: 2014-07-18 | Disposition: A | Discharge status: Home / Self Care | Payer: Medicare Other | Source: Ambulatory Visit | Attending: Family | Admitting: Family

## 2014-07-18 ENCOUNTER — Encounter: Payer: Self-pay | Admitting: Family

## 2014-07-18 ENCOUNTER — Ambulatory Visit (INDEPENDENT_AMBULATORY_CARE_PROVIDER_SITE_OTHER): Payer: Medicare Other | Admitting: Family

## 2014-07-18 ENCOUNTER — Other Ambulatory Visit: Payer: Self-pay | Admitting: Family

## 2014-07-18 VITALS — BP 154/82 | HR 70 | Resp 16 | Ht 62.0 in | Wt 170.0 lb

## 2014-07-18 DIAGNOSIS — M79609 Pain in unspecified limb: Secondary | ICD-10-CM | POA: Diagnosis not present

## 2014-07-18 DIAGNOSIS — Z48812 Encounter for surgical aftercare following surgery on the circulatory system: Secondary | ICD-10-CM

## 2014-07-18 DIAGNOSIS — I739 Peripheral vascular disease, unspecified: Secondary | ICD-10-CM

## 2014-07-18 DIAGNOSIS — Z9889 Other specified postprocedural states: Secondary | ICD-10-CM | POA: Diagnosis not present

## 2014-07-18 DIAGNOSIS — I6529 Occlusion and stenosis of unspecified carotid artery: Secondary | ICD-10-CM | POA: Insufficient documentation

## 2014-07-18 DIAGNOSIS — T148XXA Other injury of unspecified body region, initial encounter: Secondary | ICD-10-CM

## 2014-07-18 DIAGNOSIS — L24A9 Irritant contact dermatitis due friction or contact with other specified body fluids: Secondary | ICD-10-CM

## 2014-07-18 DIAGNOSIS — M79605 Pain in left leg: Secondary | ICD-10-CM

## 2014-07-18 NOTE — Progress Notes (Signed)
[facility] [place] HISTORY AND PHYSICAL -PAD  History of Present Illness [name] is a 68 y.o. female patient of Dr. Darrick Penna who is s/p left external iliac to posterior tibial artery bypass using ipsilateral greater saphenous vein on 06/11/2013; this resolved the rest pain in her left foot.  She is also s/p right femoral to above knee popliteal bypass graft and right distal popliteal to posterior tibial artery bypass graft about 2005.  She returns today for 3 month surveillance.  She has lumbar spine issues causing bilateral sciatic pain, was getting ESI's by Dr. Ethelene Hal.  Her left foot remains pain free since the vascular BPG with good color, denies rest pain, denies non-healing ulcers.  Has a trach.for the past 2 years since she was intubated for pneumonia due to tracheal swelling, managed by her ENT; pt states Dr. Jenne Pane told her that she may be able to have the trach removed soon.  Has CHF, had an MI in 2005.  Had left CEA in 2006, states TIA before that, no TIA sx's since then, has not had carotid Duplex since then, CEA was done in [place].  When she awakes in the morning, both thighs are hurting, pain in thighs increase with walking, feels like burning at anterior aspects both thighs, improves with rest.  She walks a great deal in her house, denies non healing wounds.  Recent finding is fracture in lumbar spine.  Patient denies New Medical or Surgical History other than her podiatrist  prescribed gabapentin for feet neuropathy Pt reports that she is walking much more and feels much better mentally and physically.  She is wearing knee high graduated compression hose during the day for lower leg swelling which has decreased the swelling.  Her chief complaint is anterior thigh pain aggravated by turning in bed.  Pt Diabetic: Yes, states in control, is seeing Dr. Sharl Ma, endocrinologist, and her DM has improved a great deal  Pt smoker: former smoker, quit in 2013  Pt meds  include:  Statin :Yes  Betablocker: Yes  ASA: Yes  Other anticoagulants/antiplatelets: Plavix    Past Medical History  Diagnosis Date  . Diabetes mellitus     on Lantus 30 U   . CAD (coronary artery disease)     S/p CABG and stenting  . PAD (peripheral artery disease)   . Stroke     MRI 11/2011 with remote occipital lobe. MRA with moderate left focal vertebral artery stenosis  . CHF (congestive heart failure) 11/2011    Echo with EF 30-35%, global hypokinesis, and inferior akinesis  . Hyperlipidemia   . DDD (degenerative disc disease), lumbar   . CVA (cerebral vascular accident) 11/2010  . MI (myocardial infarction) 1997  . COPD (chronic obstructive pulmonary disease)   . Anemia   . Irregular heart beat   . Hypertension   . GERD (gastroesophageal reflux disease)   . History of IBS   . Hypothyroidism     Goiter  . Thyroid disease   . Carotid artery occlusion   . Chronic kidney disease     stage 3  . History of tracheostomy 08/26/2013    Dr. Jenne Pane    Social History History  Substance Use Topics  . Smoking status: Former Smoker -- 1.00 packs/day for 50 years    Types: Cigarettes    Quit date: 01/24/2011  . Smokeless tobacco: Never Used  . Alcohol Use: No    Family History Family History  Problem Relation Age of Onset  .  Hyperlipidemia Mother   . Other Mother     AAA  . Alzheimer's disease Mother   . Heart disease Mother   . Irregular heart beat Mother   . Diabetes Daughter   . Hypertension Daughter     Past Surgical History  Procedure Laterality Date  . Ptca    . Thyroidectomy    . Coronary artery bypass graft      2 vessel  . Carotid endarterectomy  ~2008    Left   . Cholecystectomy    . Tracheostomy tube placement  01/02/2012  . Angioplasty  [id number]    Aortogram by Dr. [place] McKenzie [facility])  . Pr vein bypass graft,aorto-fem-pop      Right common femoral-AK popliteal BPG & Right Popliteal-posterior tibial  . Pr vein bypass  graft,aorto-fem-pop      Left Fem-pop BPG  . Carpal tunnel release Right   . Femoral-tibial bypass graft Left 06/11/2013    Procedure: BYPASS GRAFT  LEFT FEMORAL- POSTERIOR TIBIAL ARTERY/ REDO;  Surgeon: Sherren Kerns, MD;  Location: [facility] OR;  Service: Vascular;  Laterality: Left;  . Thrombectomy femoral artery Left 06/11/2013    Procedure: THROMBECTOMY FEMORAL ARTERY;  Surgeon: Sherren Kerns, MD;  Location: [facility] OR;  Service: Vascular;  Laterality: Left;  . Spine surgery  Oct. 27, 2014    Injection - Back    Allergies  Allergen Reactions  . Aldactone [Spironolactone]     Severe hyperkalemia   . Crestor [Rosuvastatin Calcium] Other (See Comments)    Muscle Pain  . Lisinopril Other (See Comments)    "Lowers her BP too low" & cough  . Vicodin [Hydrocodone-Acetaminophen] Nausea And Vomiting    Current Outpatient Prescriptions  Medication Sig Dispense Refill  . Acetaminophen (TYLENOL PO) Take by mouth as directed.      Marland Kitchen aspirin EC 81 MG tablet Take 81 mg by mouth daily.      . carvedilol (COREG) 25 MG tablet Take 25 mg by mouth 2 (two) times daily with a meal.      . clopidogrel (PLAVIX) 75 MG tablet Take 75 mg by mouth daily.      . DULoxetine (CYMBALTA) 20 MG capsule Take 20 mg by mouth daily.      . furosemide (LASIX) 40 MG tablet Take 1 tablet (40 mg total) by mouth 2 (two) times daily.  20 tablet  0  . furosemide (LASIX) 80 MG tablet Take 1 tablet (80 mg total) by mouth 2 (two) times daily as needed (take 1 tablet twice a day for 3 days - then prn).  30 tablet  3  . gabapentin (NEURONTIN) 300 MG capsule Take 1 capsule (300 mg total) by mouth 2 (two) times daily.  90 capsule  3  . HYDROcodone-acetaminophen (NORCO) 10-325 MG per tablet Take 1 tablet by mouth every 6 (six) hours as needed.      . insulin aspart (NOVOLOG FLEXPEN) 100 UNIT/ML SOPN FlexPen Inject 1-12 Units into the skin 3 (three) times daily with meals. As needed per sliding scale      . Insulin Glargine (TOUJEO  SOLOSTAR) 300 UNIT/ML SOPN Inject 26 Units into the skin at bedtime.      Marland Kitchen ipratropium-albuterol (DUONEB) 0.5-2.5 (3) MG/3ML SOLN Take 3 mLs by nebulization 3 (three) times daily.      . irbesartan (AVAPRO) 300 MG tablet Take 1 tablet (300 mg total) by mouth at bedtime.    0  . Multiple Vitamin (MULTIVITAMIN WITH MINERALS) TABS Take  1 tablet by mouth daily.      . nitroGLYCERIN (NITROSTAT) 0.4 MG SL tablet Place 0.4 mg under the tongue every 5 (five) minutes as needed. For chest pain      . OXYGEN-HELIUM IN Inhale into the lungs.      . simvastatin (ZOCOR) 40 MG tablet Take 40 mg by mouth every evening.       . traMADol (ULTRAM) 50 MG tablet Take 1 tablet (50 mg total) by mouth every 6 (six) hours as needed.  60 tablet  1  . insulin glargine (LANTUS) 100 UNIT/ML injection Inject 19 Units into the skin at bedtime.       . [DISCONTINUED] dexlansoprazole (DEXILANT) 60 MG capsule Take 60 mg by mouth daily.       No current facility-administered medications for this visit.    ROS: See HPI for pertinent positives and negatives.   Physical Examination  Filed Vitals:   07/18/14 1425  BP: 154/82  Pulse: 70  Resp: 16  Height:  (1.575 m)  Weight: 170 lb (77.111 kg)  SpO2: 88%   Body mass index is 31.09 kg/(m^2).  General: A&O x 3, WDWN, obese female.  Gait: slow, halting.  Eyes: PERRLA.  Pulmonary: CTAB, without wheezes , rales or rhonchi.  Cardiac: regular Rythm , without detected murmur.  Aorta is not palpable.  Radial pulses: are 2+ palpable and =  VASCULAR EXAM:  Extremities without ischemic changes  without Gangrene; without open wounds. Pretibial pitting edema: trace right, 1+ left.  LE Pulses  LEFT  RIGHT   FEMORAL  palpable   palpable   POPLITEAL  not palpable  not palpable   POSTERIOR TIBIAL  1+palpable  Not palpable   DORSALIS PEDIS  ANTERIOR TIBIAL  1+palpable  1+palpable   Abdomen: soft, no masses, mild tenderness to palpation LLQ; skin adhesion from LLQ area  panus to left groin.  Skin: no rashes, no ulcers noted.  Musculoskeletal: no muscle wasting or atrophy.  Neurologic: A&O X 3; Appropriate Affect ; SENSATION: normal; MOTOR FUNCTION: moving all extremities equally, motor strength 5/5 throughout. Speech is fluent/normal. CN 2-12 intact.    Non-Invasive Vascular Imaging: DATE: 07/18/2014 LOWER EXTREMITY ARTERIAL DUPLEX EVALUATION    INDICATION: Follow up bypass graft     PREVIOUS INTERVENTION(S): Left external iliac to mid posterior tibial artery bypass graft on 06/11/13; Right femoral to above-knee popliteal artery and popliteal to posterior tibial artery bypass grafts in 2005    DUPLEX EXAM:     RIGHT  LEFT   Peak Systolic Velocity (cm/s) Ratio (if abnormal) Waveform  Peak Systolic Velocity (cm/s) Ratio (if abnormal) Waveform     Inflow Artery 47  B     Proximal Anastomosis 52  B     Proximal Graft 45  B     Mid Graft 43  B      Distal Graft 58  B     Distal Anastomosis 68  B     Outflow Artery 89  B  0.94 Today's ABI / TBI 0.91  0.96 Previous ABI / TBI (03/06/14 ) 0.91    Waveform:    M - Monophasic       B - Biphasic       T - Triphasic  If Ankle Brachial Index (ABI) or Toe Brachial Index (TBI) performed, please see complete report     ADDITIONAL FINDINGS: No internal vessel narrowing noted within the bypass graft or anastomosis.    IMPRESSION: Patent left leg  bypass graft with no evidence of stenosis noted.    Compared to the previous exam:  No significant change in the left leg bypass graft since the exam on 03/06/14.    Carotid Duplex 12/05/13: 40-59% bilateral ICA stenosis.  ASSESSMENT: [name] is a 68 y.o. female who is s/p left external iliac to mid posterior tibial artery bypass graft on 06/11/13; Right femoral to above-knee popliteal artery and popliteal to posterior tibial artery bypass grafts in 2005, left CEA in 2006. Today's left lower extremity arterial Duplex demonstrates a patent left leg bypass graft  with no evidence of stenosis noted. Bilateral ABI's are normal. 40-59% bilateral ICA stenosis on 12/05/13, she had a TIA prior to the CEA, none since then, will recheck carotid Duplex about March, 2016. Right anterior thigh pain is not from lack of arterial perfusion; she has known left sciatic issues, states she will discuss this with her Dr. Ethelene Hal, orthopod.  PLAN:  I discussed in depth with the patient the nature of atherosclerosis, and emphasized the importance of maximal medical management including strict control of blood pressure, blood glucose, and lipid levels, obtaining regular exercise, and continue cessation of smoking.  The patient is aware that without maximal medical management the underlying atherosclerotic disease process will progress, limiting the benefit of any interventions.  Based on the patient's vascular studies and examination, pt will return to clinic in 6 months with ABI's and left LE arterial Duplex, carotid Duplex.  The patient was given information about PAD including signs, symptoms, treatment, what symptoms should prompt the patient to seek immediate medical care, and risk reduction measures to take.  Charisse March, RN, MSN, FNP-C [facility] [place] Phone: [phone]  Clinic MD: Imogene Burn  07/18/2014 2:22 PM

## 2014-07-18 NOTE — Patient Instructions (Signed)
Peripheral Vascular Disease Peripheral Vascular Disease (PVD), also called Peripheral Arterial Disease (PAD), is a circulation problem caused by cholesterol (atherosclerotic plaque) deposits in the arteries. PVD commonly occurs in the lower extremities (legs) but it can occur in other areas of the body, such as your arms. The cholesterol buildup in the arteries reduces blood flow which can cause pain and other serious problems. The presence of PVD can place a person at risk for Coronary Artery Disease (CAD).  CAUSES  Causes of PVD can be many. It is usually associated with more than one risk factor such as:   High Cholesterol.  Smoking.  Diabetes.  Lack of exercise or inactivity.  High blood pressure (hypertension).  Obesity.  Family history. SYMPTOMS   When the lower extremities are affected, patients with PVD may experience:  Leg pain with exertion or physical activity. This is called INTERMITTENT CLAUDICATION. This may present as cramping or numbness with physical activity. The location of the pain is associated with the level of blockage. For example, blockage at the abdominal level (distal abdominal aorta) may result in buttock or hip pain. Lower leg arterial blockage may result in calf pain.  As PVD becomes more severe, pain can develop with less physical activity.  In people with severe PVD, leg pain may occur at rest.  Other PVD signs and symptoms:  Leg numbness or weakness.  Coldness in the affected leg or foot, especially when compared to the other leg.  A change in leg color.  Patients with significant PVD are more prone to ulcers or sores on toes, feet or legs. These may take longer to heal or may reoccur. The ulcers or sores can become infected.  If signs and symptoms of PVD are ignored, gangrene may occur. This can result in the loss of toes or loss of an entire limb.  Not all leg pain is related to PVD. Other medical conditions can cause leg pain such  as:  Blood clots (embolism) or Deep Vein Thrombosis.  Inflammation of the blood vessels (vasculitis).  Spinal stenosis. DIAGNOSIS  Diagnosis of PVD can involve several different types of tests. These can include:  Pulse Volume Recording Method (PVR). This test is simple, painless and does not involve the use of X-rays. PVR involves measuring and comparing the blood pressure in the arms and legs. An ABI (Ankle-Brachial Index) is calculated. The normal ratio of blood pressures is 1. As this number becomes smaller, it indicates more severe disease.  < 0.95 - indicates significant narrowing in one or more leg vessels.  <0.8 - there will usually be pain in the foot, leg or buttock with exercise.  <0.4 - will usually have pain in the legs at rest.  <0.25 - usually indicates limb threatening PVD.  Doppler detection of pulses in the legs. This test is painless and checks to see if you have a pulses in your legs/feet.  A dye or contrast material (a substance that highlights the blood vessels so they show up on x-ray) may be given to help your caregiver better see the arteries for the following tests. The dye is eliminated from your body by the kidney's. Your caregiver may order blood work to check your kidney function and other laboratory values before the following tests are performed:  Magnetic Resonance Angiography (MRA). An MRA is a picture study of the blood vessels and arteries. The MRA machine uses a large magnet to produce images of the blood vessels.  Computed Tomography Angiography (CTA). A CTA   is a specialized x-ray that looks at how the blood flows in your blood vessels. An IV may be inserted into your arm so contrast dye can be injected.  Angiogram. Is a procedure that uses x-rays to look at your blood vessels. This procedure is minimally invasive, meaning a small incision (cut) is made in your groin. A small tube (catheter) is then inserted into the artery of your groin. The catheter  is guided to the blood vessel or artery your caregiver wants to examine. Contrast dye is injected into the catheter. X-rays are then taken of the blood vessel or artery. After the images are obtained, the catheter is taken out. TREATMENT  Treatment of PVD involves many interventions which may include:  Lifestyle changes:  Quitting smoking.  Exercise.  Following a low fat, low cholesterol diet.  Control of diabetes.  Foot care is very important to the PVD patient. Good foot care can help prevent infection.  Medication:  Cholesterol-lowering medicine.  Blood pressure medicine.  Anti-platelet drugs.  Certain medicines may reduce symptoms of Intermittent Claudication.  Interventional/Surgical options:  Angioplasty. An Angioplasty is a procedure that inflates a balloon in the blocked artery. This opens the blocked artery to improve blood flow.  Stent Implant. A wire mesh tube (stent) is placed in the artery. The stent expands and stays in place, allowing the artery to remain open.  Peripheral Bypass Surgery. This is a surgical procedure that reroutes the blood around a blocked artery to help improve blood flow. This type of procedure may be performed if Angioplasty or stent implants are not an option. SEEK IMMEDIATE MEDICAL CARE IF:   You develop pain or numbness in your arms or legs.  Your arm or leg turns cold, becomes blue in color.  You develop redness, warmth, swelling and pain in your arms or legs. MAKE SURE YOU:   Understand these instructions.  Will watch your condition.  Will get help right away if you are not doing well or get worse. Document Released: 11/17/2004 Document Revised: 01/02/2012 Document Reviewed: 10/14/2008 [employer] Patient Information 2015 [employer]. This information is not intended to replace advice given to you by your health care provider. Make sure you discuss any questions you have with your health care provider.   Stroke  Prevention Some medical conditions and behaviors are associated with an increased chance of having a stroke. You may prevent a stroke by making healthy choices and managing medical conditions. HOW CAN I REDUCE MY RISK OF HAVING A STROKE?   Stay physically active. Get at least 30 minutes of activity on most or all days.  Do not smoke. It may also be helpful to avoid exposure to secondhand smoke.  Limit alcohol use. Moderate alcohol use is considered to be:  No more than 2 drinks per day for men.  No more than 1 drink per day for nonpregnant women.  Eat healthy foods. This involves:  Eating 5 or more servings of fruits and vegetables a day.  Making dietary changes that address high blood pressure (hypertension), high cholesterol, diabetes, or obesity.  Manage your cholesterol levels.  Making food choices that are high in fiber and low in saturated fat, trans fat, and cholesterol may control cholesterol levels.  Take any prescribed medicines to control cholesterol as directed by your health care provider.  Manage your diabetes.  Controlling your carbohydrate and sugar intake is recommended to manage diabetes.  Take any prescribed medicines to control diabetes as directed by your health care provider.    Control your hypertension.  Making food choices that are low in salt (sodium), saturated fat, trans fat, and cholesterol is recommended to manage hypertension.  Take any prescribed medicines to control hypertension as directed by your health care provider.  Maintain a healthy weight.  Reducing calorie intake and making food choices that are low in sodium, saturated fat, trans fat, and cholesterol are recommended to manage weight.  Stop drug abuse.  Avoid taking birth control pills.  Talk to your health care provider about the risks of taking birth control pills if you are over 64 years old, smoke, get migraines, or have ever had a blood clot.  Get evaluated for sleep  disorders (sleep apnea).  Talk to your health care provider about getting a sleep evaluation if you snore a lot or have excessive sleepiness.  Take medicines only as directed by your health care provider.  For some people, aspirin or blood thinners (anticoagulants) are helpful in reducing the risk of forming abnormal blood clots that can lead to stroke. If you have the irregular heart rhythm of atrial fibrillation, you should be on a blood thinner unless there is a good reason you cannot take them.  Understand all your medicine instructions.  Make sure that other conditions (such as anemia or atherosclerosis) are addressed. SEEK IMMEDIATE MEDICAL CARE IF:   You have sudden weakness or numbness of the face, arm, or leg, especially on one side of the body.  Your face or eyelid droops to one side.  You have sudden confusion.  You have trouble speaking (aphasia) or understanding.  You have sudden trouble seeing in one or both eyes.  You have sudden trouble walking.  You have dizziness.  You have a loss of balance or coordination.  You have a sudden, severe headache with no known cause.  You have new chest pain or an irregular heartbeat. Any of these symptoms may represent a serious problem that is an emergency. Do not wait to see if the symptoms will go away. Get medical help at once. Call your local emergency services (911 in [place] Do not drive yourself to the hospital. Document Released: 11/17/2004 Document Revised: 02/24/2014 Document Reviewed: 04/12/2013 [facility] Patient Information 2015 [place], [place]. This information is not intended to replace advice given to you by your health care provider. Make sure you discuss any questions you have with your health care provider.   Venous Stasis or Chronic Venous Insufficiency Chronic venous insufficiency, also called venous stasis, is a condition that affects the veins in the legs. The condition prevents blood from being pumped  through these veins effectively. Blood may no longer be pumped effectively from the legs back to the heart. This condition can range from mild to severe. With proper treatment, you should be able to continue with an active life. CAUSES  Chronic venous insufficiency occurs when the vein walls become stretched, weakened, or damaged or when valves within the vein are damaged. Some common causes of this include:  High blood pressure inside the veins (venous hypertension).  Increased blood pressure in the leg veins from long periods of sitting or standing.  A blood clot that blocks blood flow in a vein (deep vein thrombosis).  Inflammation of a superficial vein (phlebitis) that causes a blood clot to form. RISK FACTORS Various things can make you more likely to develop chronic venous insufficiency, including:  Family history of this condition.  Obesity.  Pregnancy.  Sedentary lifestyle.  Smoking.  Jobs requiring long periods of standing or  sitting in one place.  Being a certain age. Women in their 20s and 61s and men in their 39s are more likely to develop this condition. SIGNS AND SYMPTOMS  Symptoms may include:   Varicose veins.  Skin breakdown or ulcers.  Reddened or discolored skin on the leg.  Brown, smooth, tight, and painful skin just above the ankle, usually on the inside surface (lipodermatosclerosis).  Swelling. DIAGNOSIS  To diagnose this condition, your health care provider will take a medical history and do a physical exam. The following tests may be ordered to confirm the diagnosis:  Duplex ultrasound--A procedure that produces a picture of a blood vessel and nearby organs and also provides information on blood flow through the blood vessel.  Plethysmography--A procedure that tests blood flow.  A venogram, or venography--A procedure used to look at the veins using X-ray and dye. TREATMENT The goals of treatment are to help you return to an active life and to  minimize pain or disability. Treatment will depend on the severity of the condition. Medical procedures may be needed for severe cases. Treatment options may include:   Use of compression stockings. These can help with symptoms and lower the chances of the problem getting worse, but they do not cure the problem.  Sclerotherapy--A procedure involving an injection of a material that "dissolves" the damaged veins. Other veins in the network of blood vessels take over the function of the damaged veins.  Surgery to remove the vein or cut off blood flow through the vein (vein stripping or laser ablation surgery).  Surgery to repair a valve. HOME CARE INSTRUCTIONS   Wear compression stockings as directed by your health care provider.  Only take over-the-counter or prescription medicines for pain, discomfort, or fever as directed by your health care provider.  Follow up with your health care provider as directed. SEEK MEDICAL CARE IF:   You have redness, swelling, or increasing pain in the affected area.  You see a red streak or line that extends up or down from the affected area.  You have a breakdown or loss of skin in the affected area, even if the breakdown is small.  You have an injury to the affected area. SEEK IMMEDIATE MEDICAL CARE IF:   You have an injury and open wound in the affected area.  Your pain is severe and does not improve with medicine.  You have sudden numbness or weakness in the foot or ankle below the affected area, or you have trouble moving your foot or ankle.  You have a fever or persistent symptoms for more than 2-3 days.  You have a fever and your symptoms suddenly get worse. MAKE SURE YOU:   Understand these instructions.  Will watch your condition.  Will get help right away if you are not doing well or get worse. Document Released: 02/13/2007 Document Revised: 07/31/2013 Document Reviewed: 06/17/2013 [facility] Patient Information 2015 [place], [place].  This information is not intended to replace advice given to you by your health care provider. Make sure you discuss any questions you have with your health care provider.  Graduated knee high compression hose, 20-30 mm mercury pressure, zip up if available.

## 2014-09-08 ENCOUNTER — Other Ambulatory Visit: Payer: Self-pay | Admitting: Physical Medicine and Rehabilitation

## 2014-09-08 DIAGNOSIS — R9389 Abnormal findings on diagnostic imaging of other specified body structures: Secondary | ICD-10-CM

## 2014-09-09 ENCOUNTER — Other Ambulatory Visit: Payer: Medicare Other

## 2014-09-09 ENCOUNTER — Ambulatory Visit
Admission: RE | Admit: 2014-09-09 | Discharge: 2014-09-09 | Disposition: A | Discharge status: Home / Self Care | Payer: Medicare Other | Source: Ambulatory Visit | Attending: Physical Medicine and Rehabilitation | Admitting: Physical Medicine and Rehabilitation

## 2014-09-09 DIAGNOSIS — R9389 Abnormal findings on diagnostic imaging of other specified body structures: Secondary | ICD-10-CM

## 2014-10-02 ENCOUNTER — Encounter (HOSPITAL_COMMUNITY): Payer: Self-pay | Admitting: Vascular Surgery

## 2014-10-06 ENCOUNTER — Ambulatory Visit: Payer: Medicare Other

## 2014-10-20 ENCOUNTER — Ambulatory Visit (INDEPENDENT_AMBULATORY_CARE_PROVIDER_SITE_OTHER): Payer: Medicare Other | Admitting: Podiatry

## 2014-10-20 DIAGNOSIS — B351 Tinea unguium: Secondary | ICD-10-CM | POA: Diagnosis not present

## 2014-10-20 DIAGNOSIS — M79673 Pain in unspecified foot: Secondary | ICD-10-CM | POA: Diagnosis not present

## 2014-10-20 NOTE — Progress Notes (Signed)
Subjective:     Patient ID: [name], female   DOB: 08/06/1946, 68 y.o.   MRN: [id number]  HPI patient presents today strictly for nail care stating that they are thick she cannot cut them and they become painful with shoe gear   Review of Systems     Objective:   Physical Exam Neurovascular status unchanged with thick yellow brittle nailbeds 1-5 both feet that are painful    Assessment:     Mycotic nail infection with pain 1-5 both feet    Plan:     Debride painful nailbeds 1-5 both feet with no iatrogenic bleeding noted

## 2014-10-20 NOTE — Progress Notes (Signed)
Subjective:     Patient ID: [name], female   DOB: 01/14/1946, 68 y.o.   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

## 2014-10-22 ENCOUNTER — Inpatient Hospital Stay (HOSPITAL_COMMUNITY)
Admission: EM | Admit: 2014-10-22 | Discharge: 2014-10-29 | DRG: 378 | Disposition: A | Discharge status: Home / Self Care | Payer: Medicare Other | Attending: Internal Medicine | Admitting: Internal Medicine

## 2014-10-22 ENCOUNTER — Encounter (HOSPITAL_COMMUNITY): Payer: Self-pay | Admitting: Emergency Medicine

## 2014-10-22 ENCOUNTER — Inpatient Hospital Stay (HOSPITAL_COMMUNITY): Payer: Medicare Other

## 2014-10-22 DIAGNOSIS — K921 Melena: Secondary | ICD-10-CM | POA: Diagnosis present

## 2014-10-22 DIAGNOSIS — I129 Hypertensive chronic kidney disease with stage 1 through stage 4 chronic kidney disease, or unspecified chronic kidney disease: Secondary | ICD-10-CM | POA: Diagnosis present

## 2014-10-22 DIAGNOSIS — I252 Old myocardial infarction: Secondary | ICD-10-CM

## 2014-10-22 DIAGNOSIS — Z8673 Personal history of transient ischemic attack (TIA), and cerebral infarction without residual deficits: Secondary | ICD-10-CM

## 2014-10-22 DIAGNOSIS — I739 Peripheral vascular disease, unspecified: Secondary | ICD-10-CM | POA: Diagnosis present

## 2014-10-22 DIAGNOSIS — Z955 Presence of coronary angioplasty implant and graft: Secondary | ICD-10-CM | POA: Diagnosis not present

## 2014-10-22 DIAGNOSIS — I2510 Atherosclerotic heart disease of native coronary artery without angina pectoris: Secondary | ICD-10-CM | POA: Diagnosis present

## 2014-10-22 DIAGNOSIS — J9610 Chronic respiratory failure, unspecified whether with hypoxia or hypercapnia: Secondary | ICD-10-CM | POA: Diagnosis present

## 2014-10-22 DIAGNOSIS — I5042 Chronic combined systolic (congestive) and diastolic (congestive) heart failure: Secondary | ICD-10-CM | POA: Diagnosis present

## 2014-10-22 DIAGNOSIS — Z951 Presence of aortocoronary bypass graft: Secondary | ICD-10-CM

## 2014-10-22 DIAGNOSIS — Z794 Long term (current) use of insulin: Secondary | ICD-10-CM

## 2014-10-22 DIAGNOSIS — Z87891 Personal history of nicotine dependence: Secondary | ICD-10-CM | POA: Diagnosis not present

## 2014-10-22 DIAGNOSIS — N183 Chronic kidney disease, stage 3 (moderate): Secondary | ICD-10-CM | POA: Diagnosis present

## 2014-10-22 DIAGNOSIS — Z7982 Long term (current) use of aspirin: Secondary | ICD-10-CM | POA: Diagnosis not present

## 2014-10-22 DIAGNOSIS — K219 Gastro-esophageal reflux disease without esophagitis: Secondary | ICD-10-CM | POA: Diagnosis present

## 2014-10-22 DIAGNOSIS — E039 Hypothyroidism, unspecified: Secondary | ICD-10-CM | POA: Diagnosis present

## 2014-10-22 DIAGNOSIS — J449 Chronic obstructive pulmonary disease, unspecified: Secondary | ICD-10-CM | POA: Diagnosis present

## 2014-10-22 DIAGNOSIS — Z7902 Long term (current) use of antithrombotics/antiplatelets: Secondary | ICD-10-CM | POA: Diagnosis not present

## 2014-10-22 DIAGNOSIS — F329 Major depressive disorder, single episode, unspecified: Secondary | ICD-10-CM | POA: Diagnosis present

## 2014-10-22 DIAGNOSIS — E1165 Type 2 diabetes mellitus with hyperglycemia: Secondary | ICD-10-CM | POA: Diagnosis present

## 2014-10-22 DIAGNOSIS — K922 Gastrointestinal hemorrhage, unspecified: Secondary | ICD-10-CM | POA: Diagnosis present

## 2014-10-22 DIAGNOSIS — IMO0002 Reserved for concepts with insufficient information to code with codable children: Secondary | ICD-10-CM | POA: Diagnosis present

## 2014-10-22 DIAGNOSIS — Z9889 Other specified postprocedural states: Secondary | ICD-10-CM

## 2014-10-22 DIAGNOSIS — I6529 Occlusion and stenosis of unspecified carotid artery: Secondary | ICD-10-CM | POA: Diagnosis present

## 2014-10-22 DIAGNOSIS — I10 Essential (primary) hypertension: Secondary | ICD-10-CM | POA: Diagnosis present

## 2014-10-22 DIAGNOSIS — D62 Acute posthemorrhagic anemia: Secondary | ICD-10-CM | POA: Diagnosis present

## 2014-10-22 DIAGNOSIS — E118 Type 2 diabetes mellitus with unspecified complications: Secondary | ICD-10-CM

## 2014-10-22 DIAGNOSIS — K644 Residual hemorrhoidal skin tags: Secondary | ICD-10-CM | POA: Diagnosis present

## 2014-10-22 DIAGNOSIS — E785 Hyperlipidemia, unspecified: Secondary | ICD-10-CM | POA: Diagnosis present

## 2014-10-22 LAB — COMPREHENSIVE METABOLIC PANEL
ALT: 14 U/L (ref 0–35)
ANION GAP: 7 (ref 5–15)
AST: 18 U/L (ref 0–37)
Albumin: 3.1 g/dL — ABNORMAL LOW (ref 3.5–5.2)
Alkaline Phosphatase: 74 U/L (ref 39–117)
BUN: 27 mg/dL — AB (ref 6–23)
CALCIUM: 8.4 mg/dL (ref 8.4–10.5)
CO2: 26 mmol/L (ref 19–32)
Chloride: 108 mEq/L (ref 96–112)
Creatinine, Ser: 1.3 mg/dL — ABNORMAL HIGH (ref 0.50–1.10)
GFR calc Af Amer: 48 mL/min — ABNORMAL LOW (ref >90)
GFR, EST NON AFRICAN AMERICAN: 41 mL/min — AB (ref >90)
GLUCOSE: 227 mg/dL — AB (ref 70–99)
Potassium: 4.2 mmol/L (ref 3.5–5.1)
SODIUM: 141 mmol/L (ref 135–145)
Total Bilirubin: 0.7 mg/dL (ref 0.3–1.2)
Total Protein: 5.7 g/dL — ABNORMAL LOW (ref 6.0–8.3)

## 2014-10-22 LAB — CBC
HEMATOCRIT: 23.6 % — AB (ref 36.0–46.0)
HEMOGLOBIN: 7.6 g/dL — AB (ref 12.0–15.0)
MCH: 29.5 pg (ref 26.0–34.0)
MCHC: 32.2 g/dL (ref 30.0–36.0)
MCV: 91.5 fL (ref 78.0–100.0)
Platelets: 155 10*3/uL (ref 150–400)
RBC: 2.58 MIL/uL — ABNORMAL LOW (ref 3.87–5.11)
RDW: 16.2 % — ABNORMAL HIGH (ref 11.5–15.5)
WBC: 6.2 10*3/uL (ref 4.0–10.5)

## 2014-10-22 LAB — HEMOGLOBIN AND HEMATOCRIT, BLOOD
HCT: 27.2 % — ABNORMAL LOW (ref 36.0–46.0)
Hemoglobin: 8.7 g/dL — ABNORMAL LOW (ref 12.0–15.0)

## 2014-10-22 LAB — GLUCOSE, CAPILLARY: GLUCOSE-CAPILLARY: 135 mg/dL — AB (ref 70–99)

## 2014-10-22 LAB — I-STAT CG4 LACTIC ACID, ED: Lactic Acid, Venous: 2.38 mmol/L — ABNORMAL HIGH (ref 0.5–2.2)

## 2014-10-22 LAB — PROTIME-INR
INR: 1.35 (ref 0.00–1.49)
Prothrombin Time: 16.8 seconds — ABNORMAL HIGH (ref 11.6–15.2)

## 2014-10-22 LAB — PREPARE RBC (CROSSMATCH)

## 2014-10-22 MED ORDER — CARVEDILOL 12.5 MG PO TABS
25.0000 mg | ORAL_TABLET | Freq: Two times a day (BID) | ORAL | Status: DC
  Administered 2014-10-23 – 2014-10-29 (×11): 25 mg via ORAL
  Filled 2014-10-22 (×9): qty 2
  Filled 2014-10-22: qty 4
  Filled 2014-10-22: qty 2

## 2014-10-22 MED ORDER — INSULIN ASPART 100 UNIT/ML ~~LOC~~ SOLN
0.0000 [IU] | Freq: Three times a day (TID) | SUBCUTANEOUS | Status: DC
  Administered 2014-10-23: 1 [IU] via SUBCUTANEOUS
  Administered 2014-10-24: 3 [IU] via SUBCUTANEOUS
  Administered 2014-10-24: 1 [IU] via SUBCUTANEOUS
  Administered 2014-10-25 – 2014-10-28 (×2): 2 [IU] via SUBCUTANEOUS
  Administered 2014-10-29: 1 [IU] via SUBCUTANEOUS

## 2014-10-22 MED ORDER — SODIUM CHLORIDE 0.9 % IV SOLN: 250.0000 mL | INTRAVENOUS | Status: DC | PRN

## 2014-10-22 MED ORDER — INSULIN GLARGINE 100 UNIT/ML ~~LOC~~ SOLN
26.0000 [IU] | Freq: Every day | SUBCUTANEOUS | Status: DC
  Administered 2014-10-22: 26 [IU] via SUBCUTANEOUS
  Filled 2014-10-22 (×2): qty 0.26

## 2014-10-22 MED ORDER — SODIUM CHLORIDE 0.9 % IJ SOLN
3.0000 mL | Freq: Two times a day (BID) | INTRAMUSCULAR | Status: DC
  Administered 2014-10-22 – 2014-10-29 (×6): 3 mL via INTRAVENOUS

## 2014-10-22 MED ORDER — PANTOPRAZOLE SODIUM 40 MG PO TBEC
40.0000 mg | DELAYED_RELEASE_TABLET | Freq: Every day | ORAL | Status: DC
  Administered 2014-10-23 – 2014-10-29 (×7): 40 mg via ORAL
  Filled 2014-10-22 (×7): qty 1

## 2014-10-22 MED ORDER — FUROSEMIDE 40 MG PO TABS
40.0000 mg | ORAL_TABLET | Freq: Two times a day (BID) | ORAL | Status: DC
  Administered 2014-10-23 – 2014-10-29 (×13): 40 mg via ORAL
  Filled 2014-10-22 (×13): qty 1

## 2014-10-22 MED ORDER — FUROSEMIDE 10 MG/ML IJ SOLN
40.0000 mg | Freq: Once | INTRAMUSCULAR | Status: AC
  Administered 2014-10-22: 40 mg via INTRAVENOUS
  Filled 2014-10-22: qty 4

## 2014-10-22 MED ORDER — INSULIN GLARGINE 300 UNIT/ML ~~LOC~~ SOPN: 26.0000 [IU] | PEN_INJECTOR | Freq: Every day | SUBCUTANEOUS | Status: DC

## 2014-10-22 MED ORDER — NITROGLYCERIN 0.4 MG SL SUBL: 0.4000 mg | SUBLINGUAL_TABLET | SUBLINGUAL | Status: DC | PRN

## 2014-10-22 MED ORDER — ONDANSETRON HCL 4 MG PO TABS: 4.0000 mg | ORAL_TABLET | Freq: Four times a day (QID) | ORAL | Status: DC | PRN

## 2014-10-22 MED ORDER — CLOPIDOGREL BISULFATE 75 MG PO TABS: 75.0000 mg | ORAL_TABLET | Freq: Every day | ORAL | Status: DC

## 2014-10-22 MED ORDER — TECHNETIUM TC 99M-LABELED RED BLOOD CELLS IV KIT
25.0000 | PACK | Freq: Once | INTRAVENOUS | Status: AC | PRN
  Administered 2014-10-22: 25 via INTRAVENOUS

## 2014-10-22 MED ORDER — SODIUM CHLORIDE 0.9 % IV BOLUS (SEPSIS)
250.0000 mL | Freq: Once | INTRAVENOUS | Status: AC
Start: 2014-10-22 — End: 2014-10-22
  Administered 2014-10-22: 250 mL via INTRAVENOUS

## 2014-10-22 MED ORDER — ONDANSETRON HCL 4 MG/2ML IJ SOLN: 4.0000 mg | Freq: Four times a day (QID) | INTRAMUSCULAR | Status: DC | PRN

## 2014-10-22 MED ORDER — ACETAMINOPHEN 650 MG RE SUPP: 650.0000 mg | Freq: Four times a day (QID) | RECTAL | Status: DC | PRN

## 2014-10-22 MED ORDER — DULOXETINE HCL 60 MG PO CPEP
60.0000 mg | ORAL_CAPSULE | Freq: Every day | ORAL | Status: DC
Start: 2014-10-23 — End: 2014-10-29
  Administered 2014-10-23 – 2014-10-29 (×7): 60 mg via ORAL
  Filled 2014-10-22 (×7): qty 1

## 2014-10-22 MED ORDER — SODIUM CHLORIDE 0.9 % IJ SOLN: 3.0000 mL | INTRAMUSCULAR | Status: DC | PRN

## 2014-10-22 MED ORDER — IRBESARTAN 300 MG PO TABS
300.0000 mg | ORAL_TABLET | Freq: Every day | ORAL | Status: DC
Start: 2014-10-22 — End: 2014-10-29
  Administered 2014-10-22 – 2014-10-28 (×7): 300 mg via ORAL
  Filled 2014-10-22 (×7): qty 1

## 2014-10-22 MED ORDER — WOMENS 50+ MULTI VITAMIN/MIN PO TABS: 1.0000 | ORAL_TABLET | Freq: Every day | ORAL | Status: DC

## 2014-10-22 MED ORDER — ACETAMINOPHEN 325 MG PO TABS
650.0000 mg | ORAL_TABLET | Freq: Four times a day (QID) | ORAL | Status: DC | PRN
  Administered 2014-10-25: 650 mg via ORAL
  Filled 2014-10-22: qty 2

## 2014-10-22 MED ORDER — SIMVASTATIN 40 MG PO TABS
40.0000 mg | ORAL_TABLET | Freq: Every evening | ORAL | Status: DC
Start: 2014-10-22 — End: 2014-10-29
  Administered 2014-10-22 – 2014-10-28 (×7): 40 mg via ORAL
  Filled 2014-10-22 (×7): qty 1

## 2014-10-22 MED ORDER — SODIUM CHLORIDE 0.9 % IJ SOLN
3.0000 mL | Freq: Two times a day (BID) | INTRAMUSCULAR | Status: DC
  Administered 2014-10-22 – 2014-10-29 (×7): 3 mL via INTRAVENOUS

## 2014-10-22 MED ORDER — SODIUM CHLORIDE 0.9 % IV SOLN
10.0000 mL/h | Freq: Once | INTRAVENOUS | Status: AC
  Administered 2014-10-22: 10 mL/h via INTRAVENOUS

## 2014-10-22 MED ORDER — GABAPENTIN 300 MG PO CAPS
300.0000 mg | ORAL_CAPSULE | Freq: Two times a day (BID) | ORAL | Status: DC
Start: 2014-10-22 — End: 2014-10-29
  Administered 2014-10-22 – 2014-10-29 (×14): 300 mg via ORAL
  Filled 2014-10-22 (×14): qty 1

## 2014-10-22 MED ORDER — IPRATROPIUM-ALBUTEROL 0.5-2.5 (3) MG/3ML IN SOLN: 3.0000 mL | Freq: Two times a day (BID) | RESPIRATORY_TRACT | Status: DC | PRN

## 2014-10-22 NOTE — ED Notes (Signed)
Blood transfusion complete.  No reaction noted.  No complaints voiced by pt.  PT remains in Nuclear Scan

## 2014-10-22 NOTE — ED Provider Notes (Signed)
CSN: [id number]     Arrival date & time 10/22/14  1534 History   First MD Initiated Contact with Patient 10/22/14 1559     Chief Complaint  Patient presents with  . GI Bleeding     (Consider location/radiation/quality/duration/timing/severity/associated sxs/prior Treatment) Patient is a 68 y.o. female presenting with hematochezia. The history is provided by the patient. No language interpreter was used.  Rectal Bleeding Quality:  Maroon Duration:  10 hours Timing:  Intermittent Progression:  Worsening Chronicity:  New Context: diarrhea   Similar prior episodes: no   Relieved by:  Nothing Worsened by:  Nothing tried Ineffective treatments:  None tried Associated symptoms: abdominal pain (L groin pain)   Associated symptoms: no dizziness, no epistaxis, no fever, no hematemesis, no light-headedness, no loss of consciousness, no recent illness and no vomiting   Abdominal pain:    Pain location: L groin pain, mild just PTA.   Quality:  Aching   Severity:  Mild   Duration:  1 hour   Timing:  Constant   Progression:  Unchanged Risk factors comment:  On plavix   Past Medical History  Diagnosis Date  . Diabetes mellitus     on Lantus 30 U   . CAD (coronary artery disease)     S/p CABG and stenting  . PAD (peripheral artery disease)   . Stroke     MRI 11/2011 with remote occipital lobe. MRA with moderate left focal vertebral artery stenosis  . CHF (congestive heart failure) 11/2011    Echo with EF 30-35%, global hypokinesis, and inferior akinesis  . Hyperlipidemia   . DDD (degenerative disc disease), lumbar   . CVA (cerebral vascular accident) 11/2010  . MI (myocardial infarction) 1997  . COPD (chronic obstructive pulmonary disease)   . Anemia   . Irregular heart beat   . Hypertension   . GERD (gastroesophageal reflux disease)   . History of IBS   . Hypothyroidism     Goiter  . Thyroid disease   . Carotid artery occlusion   . Chronic kidney disease     stage 3  .  History of tracheostomy 08/26/2013    Dr. Jenne Pane   Past Surgical History  Procedure Laterality Date  . Ptca    . Thyroidectomy    . Coronary artery bypass graft      2 vessel  . Carotid endarterectomy  ~2008    Left   . Cholecystectomy    . Tracheostomy tube placement  01/02/2012  . Angioplasty  4098-1191    Aortogram by Dr. Italy McKenzie [facility])  . Pr vein bypass graft,aorto-fem-pop      Right common femoral-AK popliteal BPG & Right Popliteal-posterior tibial  . Pr vein bypass graft,aorto-fem-pop      Left Fem-pop BPG  . Carpal tunnel release Right   . Femoral-tibial bypass graft Left 06/11/2013    Procedure: BYPASS GRAFT  LEFT FEMORAL- POSTERIOR TIBIAL ARTERY/ REDO;  Surgeon: Sherren Kerns, MD;  Location: [facility] OR;  Service: Vascular;  Laterality: Left;  . Thrombectomy femoral artery Left 06/11/2013    Procedure: THROMBECTOMY FEMORAL ARTERY;  Surgeon: Sherren Kerns, MD;  Location: [facility] OR;  Service: Vascular;  Laterality: Left;  . Spine surgery  Oct. 27, 2014    Injection - Back  . Abdominal aortagram N/A 04/05/2013    Procedure: ABDOMINAL Ronny Flurry;  Surgeon: Sherren Kerns, MD;  Location: [facility] CATH LAB;  Service: Cardiovascular;  Laterality: N/A;   Family History  Problem Relation Age  of Onset  . Hyperlipidemia Mother   . Other Mother     AAA  . Alzheimer's disease Mother   . Heart disease Mother   . Irregular heart beat Mother   . Diabetes Daughter   . Hypertension Daughter    History  Substance Use Topics  . Smoking status: Former Smoker -- 1.00 packs/day for 50 years    Types: Cigarettes    Quit date: 01/24/2011  . Smokeless tobacco: Never Used  . Alcohol Use: No   OB History    No data available     Review of Systems  Constitutional: Negative for fever, chills, diaphoresis, activity change, appetite change and fatigue.  HENT: Negative for congestion, facial swelling, nosebleeds, rhinorrhea and sore throat.   Eyes: Negative for photophobia and  discharge.  Respiratory: Negative for cough, chest tightness and shortness of breath.   Cardiovascular: Negative for chest pain, palpitations and leg swelling.  Gastrointestinal: Positive for abdominal pain (L groin pain), blood in stool and hematochezia. Negative for nausea, vomiting, diarrhea, rectal pain and hematemesis.  Endocrine: Negative for polydipsia and polyuria.  Genitourinary: Negative for dysuria, frequency, difficulty urinating and pelvic pain.  Musculoskeletal: Negative for back pain, arthralgias, neck pain and neck stiffness.  Skin: Negative for color change and wound.  Allergic/Immunologic: Negative for immunocompromised state.  Neurological: Negative for dizziness, loss of consciousness, facial asymmetry, weakness, light-headedness, numbness and headaches.  Hematological: Does not bruise/bleed easily.  Psychiatric/Behavioral: Negative for confusion and agitation.      Allergies  Aldactone; Crestor; Lisinopril; and Vicodin  Home Medications   Prior to Admission medications   Medication Sig Start Date End Date Taking? Authorizing Provider  glucose ([employer] GLUCOSE) 4 GM chewable tablet Chew 1 tablet by mouth as needed for low blood sugar.   Yes Historical Provider, MD  Multiple Vitamins-Minerals (WOMENS 50+ MULTI VITAMIN/MIN) TABS Take 1 tablet by mouth daily.   Yes Historical Provider, MD  Acetaminophen (TYLENOL PO) Take by mouth as directed.    Historical Provider, MD  aspirin EC 81 MG tablet Take 81 mg by mouth daily.    Historical Provider, MD  carvedilol (COREG) 25 MG tablet Take 25 mg by mouth 2 (two) times daily with a meal. 11/28/11   Danley DankerBradley W Wainright, MD  clopidogrel (PLAVIX) 75 MG tablet Take 75 mg by mouth daily. 11/28/11   Danley DankerBradley W Wainright, MD  diazepam (VALIUM) 10 MG tablet Take 10 mg by mouth. Take 1 tablet by mouth 1 hour prior to procedure, may repeat 1 time. 08/27/14   Historical Provider, MD  DULoxetine (CYMBALTA) 20 MG capsule Take 20 mg by mouth daily.     Historical Provider, MD  DULoxetine (CYMBALTA) 60 MG capsule Take 60 mg by mouth daily. 08/06/14   Historical Provider, MD  furosemide (LASIX) 40 MG tablet Take 1 tablet (40 mg total) by mouth 2 (two) times daily. 08/11/13   Enid SkeensJoshua M Zavitz, MD  furosemide (LASIX) 80 MG tablet Take 1 tablet (80 mg total) by mouth 2 (two) times daily as needed (take 1 tablet twice a day for 3 days - then prn). 07/17/14   Donato SchultzMark Skains, MD  gabapentin (NEURONTIN) 300 MG capsule Take 1 capsule (300 mg total) by mouth 2 (two) times daily. 07/09/14   Lenn SinkNorman S Regal, DPM  HYDROcodone-acetaminophen (NORCO) 10-325 MG per tablet Take 1 tablet by mouth every 6 (six) hours as needed.    Historical Provider, MD  insulin aspart (NOVOLOG FLEXPEN) 100 UNIT/ML SOPN FlexPen Inject 1-12 Units  into the skin 3 (three) times daily with meals. As needed per sliding scale    Historical Provider, MD  insulin glargine (LANTUS) 100 UNIT/ML injection Inject 19 Units into the skin at bedtime.     Historical Provider, MD  Insulin Glargine (TOUJEO SOLOSTAR) 300 UNIT/ML SOPN Inject 26 Units into the skin at bedtime.    Historical Provider, MD  ipratropium-albuterol (DUONEB) 0.5-2.5 (3) MG/3ML SOLN Take 3 mLs by nebulization 3 (three) times daily.    Historical Provider, MD  irbesartan (AVAPRO) 300 MG tablet Take 1 tablet (300 mg total) by mouth at bedtime. 01/05/13   Russella Dar, NP  lansoprazole (PREVACID) 30 MG capsule Take 30 mg by mouth 2 (two) times daily. 08/28/14   Historical Provider, MD  nitroGLYCERIN (NITROSTAT) 0.4 MG SL tablet Place 0.4 mg under the tongue every 5 (five) minutes as needed. For chest pain    Historical Provider, MD  oxyCODONE-acetaminophen (PERCOCET/ROXICET) 5-325 MG per tablet Take 1 tablet by mouth 2 (two) times daily as needed. for pain 09/11/14   Historical Provider, MD  OXYGEN-HELIUM IN Inhale into the lungs.    Historical Provider, MD  simvastatin (ZOCOR) 40 MG tablet Take 40 mg by mouth every evening.  11/28/11    Danley Danker, MD  traMADol (ULTRAM) 50 MG tablet Take 1 tablet (50 mg total) by mouth every 6 (six) hours as needed. 01/16/14   Carma Lair Nickel, NP   BP 137/50 mmHg  Pulse 62  Temp(Src) 97.5 F (36.4 C)  Resp 20  SpO2 94% Physical Exam  Constitutional: She is oriented to person, place, and time. She appears well-developed and well-nourished. No distress.  HENT:  Head: Normocephalic and atraumatic.  Mouth/Throat: No oropharyngeal exudate.  Eyes: Pupils are equal, round, and reactive to light.  Neck: Normal range of motion. Neck supple.  Cardiovascular: Normal rate, regular rhythm and normal heart sounds.  Exam reveals no gallop and no friction rub.   No murmur heard. Pulmonary/Chest: Effort normal and breath sounds normal. No respiratory distress. She has no wheezes. She has no rales.  Abdominal: Soft. Bowel sounds are normal. She exhibits no distension and no mass. There is no tenderness. There is no rebound and no guarding.  Genitourinary: Guaiac positive stool (gross dark red blood).  Musculoskeletal: Normal range of motion. She exhibits no edema or tenderness.  Neurological: She is alert and oriented to person, place, and time.  Skin: Skin is warm and dry.  Psychiatric: She has a normal mood and affect.    ED Course  Procedures (including critical care time) Labs Review Labs Reviewed  CBC - Abnormal; Notable for the following:    RBC 2.58 (*)    Hemoglobin 7.6 (*)    HCT 23.6 (*)    RDW 16.2 (*)    All other components within normal limits  COMPREHENSIVE METABOLIC PANEL - Abnormal; Notable for the following:    Glucose, Bld 227 (*)    BUN 27 (*)    Creatinine, Ser 1.30 (*)    Total Protein 5.7 (*)    Albumin 3.1 (*)    GFR calc non Af Amer 41 (*)    GFR calc Af Amer 48 (*)    All other components within normal limits  PROTIME-INR - Abnormal; Notable for the following:    Prothrombin Time 16.8 (*)    All other components within normal limits  I-STAT CG4  LACTIC ACID, ED - Abnormal; Notable for the following:    Lactic Acid,  Venous 2.38 (*)    All other components within normal limits  TYPE AND SCREEN  PREPARE RBC (CROSSMATCH)    Imaging Review No results found.   EKG Interpretation None      MDM   Final diagnoses:  Lower GI bleed    Pt is a 68 y.o. female with Pmhx as above who presents with melena since 6am, and mild L groin pain. On PE, Pt is pale, BP borderline, HR nml. No abdominal ttp, no reproducible groin ttp. On rectal, pt has active melena, had a BM in dept just prior to my exam. Hb 7.6. She is on plavix. While waiting for lytes, LA, INR, will order first unit of PRBCs.   Spoke w/ GI who rec a tag scan if she keeps bleeding, will otherwise plan to see in the am.    Spoke w/ [facility] who will admit. BP normalized, HR 50-60's, LA mildly elevated. Pt will be admitted to med surg bed. Will give IV lasix post transfusion if gets transfused in ED.   Toy CookeyMegan Alizea Pell, MD 10/22/14 1726

## 2014-10-22 NOTE — H&P (Signed)
[facility] History and Physical  [name] ZOX:[id number] DOB: Feb 24, 1946 DOA: 10/22/2014  Referring physician:  PCP: Cala Bradford, MD  Specialists:   Chief Complaint: Diarrhea and bloody stools  HPI: [name] is a 68 y.o. female  With a history of CHF with an EF of 25%, diabetes mellitus, hypertension, chronic respiratory failure requiring trach presented to the emergency department with complaints of diarrhea and bloody stools which started early this morning. Patient stated she had approximate 5-6 episodes before coming to the hospital. She noted that she had dark red blood in her stool which then became bright red blood. Patient did have some pass some clots. She denies taking any anti-inflammatories. Does state that she takes daily aspirin. Patient states her last colonoscopy was approximately 8 years ago which was done in [place]. At this time she denies any abdominal pain, nausea or vomiting. Patient does admit to having some bowel incontinence. She denies any chest pain or shortness of breath. Patient was noted to have several bloody bowel movements in the emergency department. Gastroenterology was called. [facility] was asked to admit.   Review of Systems:  Constitutional: Denies fever, chills, diaphoresis, appetite change and fatigue.  HEENT: Denies photophobia, eye pain, redness, hearing loss, ear pain, congestion, sore throat, rhinorrhea, sneezing, mouth sores, trouble swallowing, neck pain, neck stiffness and tinnitus.   Respiratory: Denies SOB, DOE, cough, chest tightness,  and wheezing.   Cardiovascular: Denies chest pain, palpitations and leg swelling.  Gastrointestinal: Complains of diarrhea and bloody stools Genitourinary: Denies dysuria, urgency, frequency, hematuria, flank pain and difficulty urinating.  Musculoskeletal: Complains of chronic back pain Skin: Denies pallor, rash and wound.  Neurological: Denies dizziness, seizures, syncope, weakness,  light-headedness, numbness and headaches.  Hematological: Denies adenopathy. Easy bruising, personal or family bleeding history  Psychiatric/Behavioral: Denies suicidal ideation, mood changes, confusion, nervousness, sleep disturbance and agitation  Past Medical History  Diagnosis Date  . Diabetes mellitus     on Lantus 30 U   . CAD (coronary artery disease)     S/p CABG and stenting  . PAD (peripheral artery disease)   . Stroke     MRI 11/2011 with remote occipital lobe. MRA with moderate left focal vertebral artery stenosis  . CHF (congestive heart failure) 11/2011    Echo with EF 30-35%, global hypokinesis, and inferior akinesis  . Hyperlipidemia   . DDD (degenerative disc disease), lumbar   . CVA (cerebral vascular accident) 11/2010  . MI (myocardial infarction) 1997  . COPD (chronic obstructive pulmonary disease)   . Anemia   . Irregular heart beat   . Hypertension   . GERD (gastroesophageal reflux disease)   . History of IBS   . Hypothyroidism     Goiter  . Thyroid disease   . Carotid artery occlusion   . Chronic kidney disease     stage 3  . History of tracheostomy 08/26/2013    Dr. Jenne Pane   Past Surgical History  Procedure Laterality Date  . Ptca    . Thyroidectomy    . Coronary artery bypass graft      2 vessel  . Carotid endarterectomy  ~2008    Left   . Cholecystectomy    . Tracheostomy tube placement  01/02/2012  . Angioplasty  [id number]    Aortogram by Dr. [place] McKenzie [facility])  . Pr vein bypass graft,aorto-fem-pop      Right common femoral-AK popliteal BPG & Right Popliteal-posterior tibial  . Pr vein bypass graft,aorto-fem-pop  Left Fem-pop BPG  . Carpal tunnel release Right   . Femoral-tibial bypass graft Left 06/11/2013    Procedure: BYPASS GRAFT  LEFT FEMORAL- POSTERIOR TIBIAL ARTERY/ REDO;  Surgeon: Sherren Kernsharles E Fields, MD;  [facility];  Service: Vascular;  Laterality: Left;  . Thrombectomy femoral artery Left 06/11/2013    Procedure:  THROMBECTOMY FEMORAL ARTERY;  Surgeon: Sherren Kernsharles E Fields, MD;  [facility];  Service: Vascular;  Laterality: Left;  . Spine surgery  Oct. 27, 2014    Injection - Back  . Abdominal aortagram N/A 04/05/2013    Procedure: ABDOMINAL Ronny FlurryAORTAGRAM;  Surgeon: Sherren Kernsharles E Fields, MD;  [facility];  Service: Cardiovascular;  Laterality: N/A;   Social History:  reports that she quit smoking about 3 years ago. Her smoking use included Cigarettes. She has a 50 pack-year smoking history. She has never used smokeless tobacco. She reports that she does not drink alcohol or use illicit drugs.   Allergies  Allergen Reactions  . Aldactone [Spironolactone] Other (See Comments)    Severe hyperkalemia   . Lisinopril Other (See Comments) and Cough    Hypotension also  . Crestor [Rosuvastatin Calcium] Other (See Comments)    Muscle Pain  . Vicodin [Hydrocodone-Acetaminophen] Nausea And Vomiting    Family History  Problem Relation Age of Onset  . Hyperlipidemia Mother   . Other Mother     AAA  . Alzheimer's disease Mother   . Heart disease Mother   . Irregular heart beat Mother   . Diabetes Daughter   . Hypertension Daughter     Prior to Admission medications   Medication Sig Start Date End Date Taking? Authorizing Provider  glucose ([employer] GLUCOSE) 4 GM chewable tablet Chew 1 tablet by mouth as needed for low blood sugar.   Yes Historical Provider, MD  Multiple Vitamins-Minerals (WOMENS 50+ MULTI VITAMIN/MIN) TABS Take 1 tablet by mouth daily.   Yes Historical Provider, MD  Acetaminophen (TYLENOL PO) Take by mouth as directed.    Historical Provider, MD  aspirin EC 81 MG tablet Take 81 mg by mouth daily.    Historical Provider, MD  carvedilol (COREG) 25 MG tablet Take 25 mg by mouth 2 (two) times daily with a meal. 11/28/11   Danley DankerBradley W Wainright, MD  clopidogrel (PLAVIX) 75 MG tablet Take 75 mg by mouth daily. 11/28/11   Danley DankerBradley W Wainright, MD  diazepam (VALIUM) 10 MG tablet Take 10 mg by mouth.  Take 1 tablet by mouth 1 hour prior to procedure, may repeat 1 time. 08/27/14   Historical Provider, MD  DULoxetine (CYMBALTA) 20 MG capsule Take 20 mg by mouth daily.    Historical Provider, MD  DULoxetine (CYMBALTA) 60 MG capsule Take 60 mg by mouth daily. 08/06/14   Historical Provider, MD  furosemide (LASIX) 40 MG tablet Take 1 tablet (40 mg total) by mouth 2 (two) times daily. 08/11/13   Enid SkeensJoshua M Zavitz, MD  furosemide (LASIX) 80 MG tablet Take 1 tablet (80 mg total) by mouth 2 (two) times daily as needed (take 1 tablet twice a day for 3 days - then prn). 07/17/14   Donato SchultzMark Skains, MD  gabapentin (NEURONTIN) 300 MG capsule Take 1 capsule (300 mg total) by mouth 2 (two) times daily. 07/09/14   Lenn SinkNorman S Regal, DPM  HYDROcodone-acetaminophen (NORCO) 10-325 MG per tablet Take 1 tablet by mouth every 6 (six) hours as needed.    Historical Provider, MD  insulin aspart (NOVOLOG FLEXPEN) 100 UNIT/ML SOPN FlexPen Inject  1-12 Units into the skin 3 (three) times daily with meals. As needed per sliding scale    Historical Provider, MD  insulin glargine (LANTUS) 100 UNIT/ML injection Inject 19 Units into the skin at bedtime.     Historical Provider, MD  Insulin Glargine (TOUJEO SOLOSTAR) 300 UNIT/ML SOPN Inject 26 Units into the skin at bedtime.    Historical Provider, MD  ipratropium-albuterol (DUONEB) 0.5-2.5 (3) MG/3ML SOLN Take 3 mLs by nebulization 3 (three) times daily.    Historical Provider, MD  irbesartan (AVAPRO) 300 MG tablet Take 1 tablet (300 mg total) by mouth at bedtime. 01/05/13   Russella DarAllison L Ellis, NP  lansoprazole (PREVACID) 30 MG capsule Take 30 mg by mouth 2 (two) times daily. 08/28/14   Historical Provider, MD  nitroGLYCERIN (NITROSTAT) 0.4 MG SL tablet Place 0.4 mg under the tongue every 5 (five) minutes as needed. For chest pain    Historical Provider, MD  oxyCODONE-acetaminophen (PERCOCET/ROXICET) 5-325 MG per tablet Take 1 tablet by mouth 2 (two) times daily as needed. for pain 09/11/14    Historical Provider, MD  OXYGEN-HELIUM IN Inhale into the lungs.    Historical Provider, MD  simvastatin (ZOCOR) 40 MG tablet Take 40 mg by mouth every evening.  11/28/11   Danley DankerBradley W Wainright, MD  traMADol (ULTRAM) 50 MG tablet Take 1 tablet (50 mg total) by mouth every 6 (six) hours as needed. 01/16/14   Carma LairSuzanne L Nickel, NP   Physical Exam: Filed Vitals:   10/22/14 1700  BP: 137/50  Pulse: 62  Temp:   Resp: 20     General: Well developed, well nourished, NAD, appears stated age  HEENT: NCAT, PERRLA, EOMI, Anicteic Sclera, mucous membranes moist.   Neck: Supple, no JVD, no masses, trach collar   Cardiovascular: S1 S2 auscultated, no rubs, murmurs or gallops. Regular rate and rhythm.  Respiratory: Clear to auscultation bilaterally with equal chest rise  Abdomen: Soft, nontender, nondistended, + bowel sounds  Extremities: warm dry without cyanosis clubbing or edema  Neuro: AAOx3, cranial nerves grossly intact. Strength 5/5 in patient's upper and lower extremities bilaterally  Skin: Without rashes exudates or nodules  Psych: Normal affect and demeanor with intact judgement and insight  Labs on Admission:  Basic Metabolic Panel:  Recent Labs Lab 10/22/14 1602  NA 141  K 4.2  CL 108  CO2 26  GLUCOSE 227*  BUN 27*  CREATININE 1.30*  CALCIUM 8.4   Liver Function Tests:  Recent Labs Lab 10/22/14 1602  AST 18  ALT 14  ALKPHOS 74  BILITOT 0.7  PROT 5.7*  ALBUMIN 3.1*   No results for input(s): LIPASE, AMYLASE in the last 168 hours. No results for input(s): AMMONIA in the last 168 hours. CBC:  Recent Labs Lab 10/22/14 1602  WBC 6.2  HGB 7.6*  HCT 23.6*  MCV 91.5  PLT 155   Cardiac Enzymes: No results for input(s): CKTOTAL, CKMB, CKMBINDEX, TROPONINI in the last 168 hours.  BNP (last 3 results) No results for input(s): PROBNP in the last 8760 hours. CBG: No results for input(s): GLUCAP in the last 168 hours.  Radiological Exams on Admission: No  results found.  EKG: None, Monitor shows Sinus bradycardia to Sinus rhythm, rate 58-70  Assessment/Plan  Lower GI Bleeding/Hematochezia -patient will be admitted to medical floor with telemetry -Patient currently hemodynamically stable -Will monitor hemoglobin every 8 hours -Patient does receive 1 unit packed red blood cells -Gastroenterology consulted and appreciated (gastroenterology called by the ER -Will place  the patient on clear liquid diet and make her nothing by mouth after midnight  Diarrhea -Likely secondary to patient's GI bleeding -Will check C. difficile PCR  Acute blood loss Anemia  -Secondary to GI bleeding -Patient's hemoglobin appears to be around 11 baseline -Treatment plan as above  Chronic combined systolic and diastolic heart failure -Patient currently euvolemic -Will give patient 20 of Lasix IV after blood transfusion -Echocardiogram in January 2014 showed an EF of 25-30%, grade 2 diastolic dysfunction -Will monitor daily weights, intake and output -Continue Lasix, coreg, avapro  Coronary artery disease -Currently stable, patient chest pain free -Will hold plavix and aspirin at this due to GI bleed  Hypertension -Continue Avapro, Coreg with holding parameters  Chronic respiratory failure/trach -Stable  Hyperlipidemia -Continue statin  Diabetes mellitus, type II -Continue insulin sliding scale was CBG monitoring -Will hold patient's home regimen as she will be placed on a clear liquid diet  Chronic kidney disease, stage III -Creatinine appears to be at baseline we'll continue to monitor BMP  Depression -Continue Cymbalta  DVT prophylaxis: SCDs  Code Status: Full  Condition: Guarded  Family Communication: None at bedside. Admission, patients condition and plan of care including tests being ordered have been discussed with the patient, who indicates understanding and agrees with the plan and Code Status.  Disposition Plan:  Admitted  Time spent: 65 minutes  Arya Boxley D.O. [facility] Pager [phone]  If 7PM-7AM, please contact night-coverage [email or web address] Password [email or web address] 10/22/2014, 5:49 PM

## 2014-10-22 NOTE — ED Notes (Signed)
MD at bedside. 

## 2014-10-22 NOTE — ED Notes (Signed)
NOTIFIED DR. DOCERTY IN PERSON FOR PATIENTS LAB RESULTS OF CG4+ =2.5338mmol/L ,@16 :30 PM ,10/22/2014.

## 2014-10-22 NOTE — ED Notes (Signed)
No reaction noted to blood transfusion. Rate increased.

## 2014-10-22 NOTE — ED Notes (Addendum)
Pt is from home, Pt started  having  rectal bleeding this morning. PTstates she  is passing clots and its constant. PT states blood is dark red. Pt complains of Lower abd pain denies n/v 108/74 Hr 80 RR12 95% RA

## 2014-10-22 NOTE — Progress Notes (Signed)
Received patient to 4N21 alert and oriented no complaints at this time.

## 2014-10-23 DIAGNOSIS — I5022 Chronic systolic (congestive) heart failure: Secondary | ICD-10-CM

## 2014-10-23 DIAGNOSIS — E1165 Type 2 diabetes mellitus with hyperglycemia: Secondary | ICD-10-CM

## 2014-10-23 DIAGNOSIS — I25810 Atherosclerosis of coronary artery bypass graft(s) without angina pectoris: Secondary | ICD-10-CM

## 2014-10-23 DIAGNOSIS — E785 Hyperlipidemia, unspecified: Secondary | ICD-10-CM

## 2014-10-23 DIAGNOSIS — I159 Secondary hypertension, unspecified: Secondary | ICD-10-CM

## 2014-10-23 DIAGNOSIS — Z930 Tracheostomy status: Secondary | ICD-10-CM

## 2014-10-23 LAB — BASIC METABOLIC PANEL
Anion gap: 7 (ref 5–15)
BUN: 30 mg/dL — ABNORMAL HIGH (ref 6–23)
CO2: 28 mmol/L (ref 19–32)
Calcium: 8.3 mg/dL — ABNORMAL LOW (ref 8.4–10.5)
Chloride: 109 mEq/L (ref 96–112)
Creatinine, Ser: 1.28 mg/dL — ABNORMAL HIGH (ref 0.50–1.10)
GFR calc Af Amer: 49 mL/min — ABNORMAL LOW (ref >90)
GFR calc non Af Amer: 42 mL/min — ABNORMAL LOW (ref >90)
Glucose, Bld: 75 mg/dL (ref 70–99)
POTASSIUM: 3.7 mmol/L (ref 3.5–5.1)
Sodium: 144 mmol/L (ref 135–145)

## 2014-10-23 LAB — GLUCOSE, CAPILLARY
GLUCOSE-CAPILLARY: 165 mg/dL — AB (ref 70–99)
GLUCOSE-CAPILLARY: 64 mg/dL — AB (ref 70–99)
GLUCOSE-CAPILLARY: 65 mg/dL — AB (ref 70–99)
GLUCOSE-CAPILLARY: 131 mg/dL — AB (ref 70–99)
Glucose-Capillary: 132 mg/dL — ABNORMAL HIGH (ref 70–99)
Glucose-Capillary: 127 mg/dL — ABNORMAL HIGH (ref 70–99)
Glucose-Capillary: 134 mg/dL — ABNORMAL HIGH (ref 70–99)

## 2014-10-23 LAB — CBC
HCT: 24.2 % — ABNORMAL LOW (ref 36.0–46.0)
Hemoglobin: 7.9 g/dL — ABNORMAL LOW (ref 12.0–15.0)
MCH: 29 pg (ref 26.0–34.0)
MCHC: 32.6 g/dL (ref 30.0–36.0)
MCV: 89 fL (ref 78.0–100.0)
Platelets: 133 10*3/uL — ABNORMAL LOW (ref 150–400)
RBC: 2.72 MIL/uL — ABNORMAL LOW (ref 3.87–5.11)
RDW: 17.7 % — ABNORMAL HIGH (ref 11.5–15.5)
WBC: 5.7 10*3/uL (ref 4.0–10.5)

## 2014-10-23 LAB — CLOSTRIDIUM DIFFICILE BY PCR: CDIFFPCR: NEGATIVE

## 2014-10-23 LAB — HEMOGLOBIN AND HEMATOCRIT, BLOOD
HCT: 25 % — ABNORMAL LOW (ref 36.0–46.0)
HCT: 24.8 % — ABNORMAL LOW (ref 36.0–46.0)
Hemoglobin: 8.1 g/dL — ABNORMAL LOW (ref 12.0–15.0)
Hemoglobin: 7.9 g/dL — ABNORMAL LOW (ref 12.0–15.0)

## 2014-10-23 MED ORDER — DEXTROSE 50 % IV SOLN
1.0000 | INTRAVENOUS | Status: DC | PRN
  Administered 2014-10-23: 50 mL via INTRAVENOUS
  Filled 2014-10-23: qty 50

## 2014-10-23 MED ORDER — INSULIN GLARGINE 100 UNIT/ML ~~LOC~~ SOLN
20.0000 [IU] | Freq: Every day | SUBCUTANEOUS | Status: DC
  Administered 2014-10-23 – 2014-10-25 (×3): 20 [IU] via SUBCUTANEOUS
  Filled 2014-10-23 (×4): qty 0.2

## 2014-10-23 MED ORDER — DEXTROSE-NACL 5-0.45 % IV SOLN
INTRAVENOUS | Status: DC
  Administered 2014-10-23 – 2014-10-24 (×2): via INTRAVENOUS

## 2014-10-23 MED ORDER — DEXTROSE 50 % IV SOLN
1.0000 | Freq: Once | INTRAVENOUS | Status: AC
  Administered 2014-10-23: 50 mL via INTRAVENOUS
  Filled 2014-10-23: qty 50

## 2014-10-23 NOTE — Progress Notes (Signed)
Chaplain initiated visit with pt and family. Chaplain offered prayer and emotional support. Chaplain made pt and family aware of her services should they need them. Chaplain will continue to follow.   10/23/14 1000  Clinical Encounter Type  Visited With Patient and family together  Visit Type Initial;Spiritual support  Spiritual Encounters  Spiritual Needs Emotional;Prayer  Stress Factors  Patient Stress Factors Health changes  [name], Mayer MaskerCourtney F, Chaplain 10/23/2014 10:04 AM

## 2014-10-23 NOTE — Progress Notes (Signed)
Results for Scot JunBEAMON, Devanie I (MRN [id number]) as of 10/23/2014 13:49  Ref. Range 10/22/2014 23:50 10/23/2014 07:59 10/23/2014 08:38 10/23/2014 11:51 10/23/2014 12:37  Glucose-Capillary Latest Range: 70-99 mg/dL 132135 (H) 64 (L) 440132 (H) 65 (L) 127 (H)  CBGs have been less than 180 mg/dl.  Had taken Lantus 26 units last night.  Recommend decreasing Lantus to 20 units daily if CBGs continue to be less than 70 mg/dl.  Will continue to follow while in hospital. Smith MinceKendra Izzac Rockett RN BSN CDE

## 2014-10-23 NOTE — Consult Note (Signed)
[facility] CONS[facility] Reason for consult: [name] bleeding Referring Physician: [facility]. PCP: Dr. Dema Severin. Endocrine: Dr. Henrene Dodge [name] is an 68 y.o. female.  HPI: she has recently moved here from [facility]. She has had previous colonoscopy there that she thinks was negative. She was employed at the [employer] for a number of years. She has multiple problems mostly related to her severe type II diabetes. She has had strokes, MI, ejection fraction 30%, peripheral vascular disease. She has seen Dr. [name]. She has congestive heart failure with symptoms fairly stable. She has severe COPD and lung disease and has had a tracheostomy place. She's had multiple peripheral vascular procedures. She lives with her daughter. She apparently is begun to have loose watery stools for the past 2 to 3 weeks. They came on fairly suddenly and did become bloody. She's had several accidents in bed. The stools only became bloody yesterday. She's not on blood thiners other than aspirin. No significant abdominal pain, no recent antibiotics. Patient notes that her stools have slowed down some since coming into the hospital. A [name] bleeding scan was obtained yesterday and was negative. Stool culture C. difficile etc. are pending. She continues to have no pain. No recent travel, no pets, her daughter has not been sick. She's not really eaten outside of the home very often.  Past Medical History  Diagnosis Date  . Diabetes mellitus     on Lantus 30 U   . CAD (coronary artery disease)     S/p CABG and stenting  . PAD (peripheral artery disease)   . Stroke     MRI 11/2011 with remote occipital lobe. MRA with moderate left focal vertebral artery stenosis  . CHF (congestive heart failure) 11/2011    Echo with EF 30-35%, global hypokinesis, and inferior akinesis  . Hyperlipidemia   . DDD (degenerative disc disease), lumbar   . CVA (cerebral vascular accident) 11/2010  . MI (myocardial infarction)  1997  . COPD (chronic obstructive pulmonary disease)   . Anemia   . Irregular heart beat   . Hypertension   . GERD (gastroesophageal reflux disease)   . History of IBS   . Hypothyroidism     Goiter  . Thyroid disease   . Carotid artery occlusion   . Chronic kidney disease     stage 3  . History of tracheostomy 08/26/2013    Dr. Redmond Baseman    Past Surgical History  Procedure Laterality Date  . Ptca    . Thyroidectomy    . Coronary artery bypass graft      2 vessel  . Carotid endarterectomy  ~2008    Left   . Cholecystectomy    . Tracheostomy tube placement  01/02/2012  . Angioplasty  [id number]    Aortogram by Dr. Mali McKenzie [facility])  . Pr vein bypass graft,aorto-fem-pop      Right common femoral-AK popliteal BPG & Right Popliteal-posterior tibial  . Pr vein bypass graft,aorto-fem-pop      Left Fem-pop BPG  . Carpal tunnel release Right   . Femoral-tibial bypass graft Left 06/11/2013    Procedure: BYPASS GRAFT  LEFT FEMORAL- POSTERIOR TIBIAL ARTERY/ REDO;  Surgeon: Elam Dutch, MD;  Location: [place];  Service: Vascular;  Laterality: Left;  . Thrombectomy femoral artery Left 06/11/2013    Procedure: THROMBECTOMY FEMORAL ARTERY;  Surgeon: Elam Dutch, MD;  Location: [place];  Service: Vascular;  Laterality: Left;  . Spine surgery  Oct. 27, 2014  Injection - Back  . Abdominal aortagram N/A 04/05/2013    Procedure: ABDOMINAL Maxcine Ham;  Surgeon: Elam Dutch, MD;  Location: [facility] CATH LAB;  Service: Cardiovascular;  Laterality: N/A;    Family History  Problem Relation Age of Onset  . Hyperlipidemia Mother   . Other Mother     AAA  . Alzheimer's disease Mother   . Heart disease Mother   . Irregular heart beat Mother   . Diabetes Daughter   . Hypertension Daughter     Social History:  reports that she quit smoking about 3 years ago. Her smoking use included Cigarettes. She has a 50 pack-year smoking history. She has never used smokeless tobacco. She reports  that she does not drink alcohol or use illicit drugs.  Allergies:  Allergies  Allergen Reactions  . Aldactone [Spironolactone] Other (See Comments)    Severe hyperkalemia   . Lisinopril Other (See Comments) and Cough    Hypotension also  . Crestor [Rosuvastatin Calcium] Other (See Comments)    Muscle Pain  . Vicodin [Hydrocodone-Acetaminophen] Nausea And Vomiting    Medications; Prior to Admission medications   Medication Sig Start Date End Date Taking? Authorizing Provider  aspirin EC 81 MG tablet Take 81 mg by mouth daily.   Yes Historical Provider, MD  carvedilol (COREG) 25 MG tablet Take 25 mg by mouth 2 (two) times daily with a meal. 11/28/11  Yes Delsa Sale, MD  clopidogrel (PLAVIX) 75 MG tablet Take 75 mg by mouth daily. 11/28/11  Yes Delsa Sale, MD  DULoxetine (CYMBALTA) 60 MG capsule Take 60 mg by mouth daily. 08/06/14  Yes Historical Provider, MD  furosemide (LASIX) 40 MG tablet Take 1 tablet (40 mg total) by mouth 2 (two) times daily. 08/11/13  Yes Mariea Clonts, MD  gabapentin (NEURONTIN) 300 MG capsule Take 1 capsule (300 mg total) by mouth 2 (two) times daily. 07/09/14  Yes Wallene Huh, DPM  insulin aspart (NOVOLOG FLEXPEN) 100 UNIT/ML SOPN FlexPen Inject 1-12 Units into the skin 3 (three) times daily with meals. As needed per sliding scale   Yes Historical Provider, MD  Insulin Glargine (TOUJEO SOLOSTAR) 300 UNIT/ML SOPN Inject 26 Units into the skin at bedtime.   Yes Historical Provider, MD  ipratropium-albuterol (DUONEB) 0.5-2.5 (3) MG/3ML SOLN Take 3 mLs by nebulization 2 (two) times daily as needed (for shortnes of breath).    Yes Historical Provider, MD  irbesartan (AVAPRO) 300 MG tablet Take 1 tablet (300 mg total) by mouth at bedtime. 01/05/13  Yes Samella Parr, NP  lansoprazole (PREVACID) 30 MG capsule Take 30 mg by mouth 2 (two) times daily. 08/28/14  Yes Historical Provider, MD  Multiple Vitamins-Minerals (WOMENS 50+ MULTI VITAMIN/MIN) TABS  Take 1 tablet by mouth daily.   Yes Historical Provider, MD  OXYGEN-HELIUM IN Inhale 4 L into the lungs at bedtime.    Yes Historical Provider, MD  simvastatin (ZOCOR) 40 MG tablet Take 40 mg by mouth every evening.  11/28/11  Yes Delsa Sale, MD  furosemide (LASIX) 80 MG tablet Take 1 tablet (80 mg total) by mouth 2 (two) times daily as needed (take 1 tablet twice a day for 3 days - then prn). 07/17/14   Candee Furbish, MD  glucose ([facility] GLUCOSE) 4 GM chewable tablet Chew 1 tablet by mouth as needed for low blood sugar.    Historical Provider, MD  nitroGLYCERIN (NITROSTAT) 0.4 MG SL tablet Place 0.4 mg under the tongue every 5 (five)  minutes as needed. For chest pain    Historical Provider, MD  traMADol (ULTRAM) 50 MG tablet Take 1 tablet (50 mg total) by mouth every 6 (six) hours as needed. 01/16/14   Sharmon Leyden Nickel, NP   . carvedilol  25 mg Oral BID WC  . DULoxetine  60 mg Oral Daily  . furosemide  40 mg Oral BID  . gabapentin  300 mg Oral BID  . insulin aspart  0-9 Units Subcutaneous TID WC  . insulin glargine  26 Units Subcutaneous QHS  . irbesartan  300 mg Oral QHS  . pantoprazole  40 mg Oral Daily  . simvastatin  40 mg Oral QPM  . sodium chloride  3 mL Intravenous Q12H  . sodium chloride  3 mL Intravenous Q12H   PRN Meds sodium chloride, acetaminophen **OR** acetaminophen, dextrose, ipratropium-albuterol, nitroGLYCERIN, ondansetron **OR** ondansetron (ZOFRAN) IV, sodium chloride Results for orders placed or performed during the hospital encounter of 10/22/14 (from the past 48 hour(s))  CBC     Status: Abnormal   Collection Time: 10/22/14  4:02 PM  Result Value Ref Range   WBC 6.2 4.0 - 10.5 K/uL   RBC 2.58 (L) 3.87 - 5.11 MIL/uL   Hemoglobin 7.6 (L) 12.0 - 15.0 g/dL   HCT 23.6 (L) 36.0 - 46.0 %   MCV 91.5 78.0 - 100.0 fL   MCH 29.5 26.0 - 34.0 pg   MCHC 32.2 30.0 - 36.0 g/dL   RDW 16.2 (H) 11.5 - 15.5 %   Platelets 155 150 - 400 K/uL  Comprehensive metabolic panel      Status: Abnormal   Collection Time: 10/22/14  4:02 PM  Result Value Ref Range   Sodium 141 135 - 145 mmol/L    Comment: Please note change in reference range.   Potassium 4.2 3.5 - 5.1 mmol/L    Comment: Please note change in reference range.   Chloride 108 96 - 112 mEq/L   CO2 26 19 - 32 mmol/L   Glucose, Bld 227 (H) 70 - 99 mg/dL   BUN 27 (H) 6 - 23 mg/dL   Creatinine, Ser 1.30 (H) 0.50 - 1.10 mg/dL   Calcium 8.4 8.4 - 10.5 mg/dL   Total Protein 5.7 (L) 6.0 - 8.3 g/dL   Albumin 3.1 (L) 3.5 - 5.2 g/dL   AST 18 0 - 37 U/L   ALT 14 0 - 35 U/L   Alkaline Phosphatase 74 39 - 117 U/L   Total Bilirubin 0.7 0.3 - 1.2 mg/dL   GFR calc non Af Amer 41 (L) >90 mL/min   GFR calc Af Amer 48 (L) >90 mL/min    Comment: (NOTE) The eGFR has been calculated using the CKD EPI equation. This calculation has not been validated in all clinical situations. eGFR's persistently <90 mL/min signify possible Chronic Kidney Disease.    Anion gap 7 5 - 15  Protime-INR (if patient is taking Coumadin)     Status: Abnormal   Collection Time: 10/22/14  4:02 PM  Result Value Ref Range   Prothrombin Time 16.8 (H) 11.6 - 15.2 seconds   INR 1.35 0.00 - 1.49  Becky-Stat CG4 Lactic Acid, ED     Status: Abnormal   Collection Time: 10/22/14  4:24 PM  Result Value Ref Range   Lactic Acid, Venous 2.38 (H) 0.5 - 2.2 mmol/L  Type and screen     Status: None   Collection Time: 10/22/14  4:32 PM  Result Value Ref Range  ABO/RH(D) O POS    Antibody Screen NEG    Sample Expiration 10/25/2014    Unit Number [id number]    Blood Component Type RED CELLS,LR    Unit division 00    Status of Unit ISSUED,FINAL    Transfusion Status OK TO TRANSFUSE    Crossmatch Result Compatible   Prepare RBC     Status: None   Collection Time: 10/22/14  4:32 PM  Result Value Ref Range   Order Confirmation ORDER PROCESSED BY BLOOD BANK   Glucose, capillary     Status: Abnormal   Collection Time: 10/22/14  7:01 PM  Result Value Ref  Range   Glucose-Capillary 165 (H) 70 - 99 mg/dL  Hemoglobin and hematocrit, blood     Status: Abnormal   Collection Time: 10/22/14  9:50 PM  Result Value Ref Range   Hemoglobin 8.7 (L) 12.0 - 15.0 g/dL   HCT 27.2 (L) 36.0 - 46.0 %  Glucose, capillary     Status: Abnormal   Collection Time: 10/22/14 11:50 PM  Result Value Ref Range   Glucose-Capillary 135 (H) 70 - 99 mg/dL  Basic metabolic panel     Status: Abnormal   Collection Time: 10/23/14  5:45 AM  Result Value Ref Range   Sodium 144 135 - 145 mmol/L    Comment: Please note change in reference range.   Potassium 3.7 3.5 - 5.1 mmol/L    Comment: Please note change in reference range.   Chloride 109 96 - 112 mEq/L   CO2 28 19 - 32 mmol/L   Glucose, Bld 75 70 - 99 mg/dL   BUN 30 (H) 6 - 23 mg/dL   Creatinine, Ser 1.28 (H) 0.50 - 1.10 mg/dL   Calcium 8.3 (L) 8.4 - 10.5 mg/dL   GFR calc non Af Amer 42 (L) >90 mL/min   GFR calc Af Amer 49 (L) >90 mL/min    Comment: (NOTE) The eGFR has been calculated using the CKD EPI equation. This calculation has not been validated in all clinical situations. eGFR's persistently <90 mL/min signify possible Chronic Kidney Disease.    Anion gap 7 5 - 15  CBC     Status: Abnormal   Collection Time: 10/23/14  5:45 AM  Result Value Ref Range   WBC 5.7 4.0 - 10.5 K/uL   RBC 2.72 (L) 3.87 - 5.11 MIL/uL   Hemoglobin 7.9 (L) 12.0 - 15.0 g/dL   HCT 24.2 (L) 36.0 - 46.0 %   MCV 89.0 78.0 - 100.0 fL   MCH 29.0 26.0 - 34.0 pg   MCHC 32.6 30.0 - 36.0 g/dL   RDW 17.7 (H) 11.5 - 15.5 %   Platelets 133 (L) 150 - 400 K/uL  Glucose, capillary     Status: Abnormal   Collection Time: 10/23/14  7:59 AM  Result Value Ref Range   Glucose-Capillary 64 (L) 70 - 99 mg/dL  Glucose, capillary     Status: Abnormal   Collection Time: 10/23/14  8:38 AM  Result Value Ref Range   Glucose-Capillary 132 (H) 70 - 99 mg/dL   Comment 1 Documented in Chart    Comment 2 Notify RN   Glucose, capillary     Status:  Abnormal   Collection Time: 10/23/14 11:51 AM  Result Value Ref Range   Glucose-Capillary 65 (L) 70 - 99 mg/dL   Comment 1 Documented in Chart    Comment 2 Notify RN   Glucose, capillary     Status: Abnormal  Collection Time: 10/23/14 12:37 PM  Result Value Ref Range   Glucose-Capillary 127 (H) 70 - 99 mg/dL   Comment 1 Notify RN    Comment 2 Documented in Chart     Nm Gi Blood Loss  10/22/2014   CLINICAL DATA:  Gastrointestinal hemorrhage, unspecified gastritis.  EXAM: NUCLEAR MEDICINE GASTROINTESTINAL BLEEDING SCAN  TECHNIQUE: Sequential abdominal images were obtained following intravenous administration of Tc-18mlabeled red blood cells.  RADIOPHARMACEUTICALS:  25 mCi Tc-925mn-vitro labeled red cells.  COMPARISON:  None.  FINDINGS: Normal biodistribution of the radiotracer. Linear activity in the pelvis is present on the initial image and does not increase or travel typical of GI hemorrhage. No active GI hemorrhage suspected.  IMPRESSION: Negative.    The source of GI hemorrhage is not identified.   Electronically Signed   By: JoJorje Guild.D.   On: 10/22/2014 21:31    Endocrine/Heme:            Blood pressure 116/52, pulse 65, temperature 98.2 F (36.8 C), temperature source Oral, resp. rate 21, weight 79 kg (174 lb 2.6 oz), SpO2 96 %.  Physical exam:   General--African-American female in no acute distress. ENT-- she is able to talk does have tracheostomy. Neck-- no gross lymphadenopathy Heart-- regular rate and rhythm without murmurs are gallops Lungs--grossly clear Abdomen-- soft and nontender Psych-- alert and oriented with normal affect appears to have good understanding of her symptoms.   Assessment: 1. Diarrhea with bleeding. Only a small amount of reddish-brown stool on the pad in the bed. Her bleeding scan was negative. This could be an infectious diarrhea with bleeding hemorrhoids. Does not appear to be ischemic colitis and that she is not having any  pain. 2. Multiple medical problems as noted abov  Plan: would follow her clinically with a clear liquid diet for now. [name] pathogen panel has been ordered check C diff toxin. She does not improve, she may need a sigmoidoscopy.   [name],[name] L 10/23/2014, 1:56 PM

## 2014-10-23 NOTE — Progress Notes (Signed)
[facility]                                                                              Patient Demographics  [name], is a 68 y.o. female, DOB - 1946/09/21, GNF:[id number]  Admit date - 10/22/2014   Admitting Physician Edsel Petrin, DO  Outpatient Primary MD for the patient is Cala Bradford, MD  LOS - 1   Chief Complaint  Patient presents with  . GI Bleeding      HPI on 10/22/2014  [name] is a 68 y.o. female with a history of CHF with an EF of 25%, diabetes mellitus, hypertension, chronic respiratory failure requiring trach presented to the emergency department with complaints of diarrhea and bloody stools which started early this morning. Patient stated she had approximate 5-6 episodes before coming to the hospital. She noted that she had dark red blood in her stool which then became bright red blood. Patient did have some pass some clots. She denies taking any anti-inflammatories. Does state that she takes daily aspirin. Patient states her last colonoscopy was approximately 8 years ago which was done in [place]. At this time she denies any abdominal pain, nausea or vomiting. Patient does admit to having some bowel incontinence. She denies any chest pain or shortness of breath. Patient was noted to have several bloody bowel movements in the emergency department. Gastroenterology was called. [facility] was asked to admit.   Assessment & Plan   Lower GI Bleeding/Hematochezia -Patient currently hemodynamically stable -Will monitor hemoglobin every 8 hours  -Received 1uPRBC upon admission, Hb currently 7.9 -Gastroenterology consulted and appreciated, pending further recommendations -Bleeding scan was negative  Diarrhea -Likely secondary to patient's GI bleeding -C. difficile PCR pending  -Unlikely infectious as patient is afebrile with no leukocytosis  Acute blood loss Anemia/Symptomatic anemia  -Secondary to GI bleeding -Patient's hemoglobin appears to be  around 11 baseline -Treatment plan as above -Patient does complain of dizziness with standing  Chronic combined systolic and diastolic heart failure -Patient currently euvolemic -Echocardiogram in January 2014 showed an EF of 25-30%, grade 2 diastolic dysfunction -Will monitor daily weights, intake and output -Continue Lasix, coreg, avapro  Coronary artery disease -Currently stable, patient chest pain free -Will hold plavix and aspirin at this due to GI bleed  Hypertension -Continue Avapro, Coreg with holding parameters  Chronic respiratory failure/trach -Stable  Hyperlipidemia -Continue statin  Diabetes mellitus, type II -Continue insulin sliding scale was CBG monitoring -Will hold patient's home regimen as she will be placed on a clear liquid diet  Chronic kidney disease, stage III -Creatinine appears to be at baseline we'll continue to monitor BMP  Depression -Continue Cymbalta  Code Status: Full  Family Communication: Daughter at bedside  Disposition Plan: Admitted  Time Spent in minutes   30 minutes  Procedures  None  Consults   Gastroenterology, [facility]  DVT Prophylaxis  SCDs  Lab Results  Component Value Date   PLT 133* 10/23/2014    Medications  Scheduled Meds: . carvedilol  25 mg Oral BID WC  . DULoxetine  60 mg Oral Daily  . furosemide  40 mg Oral BID  . gabapentin  300 mg Oral BID  .  insulin aspart  0-9 Units Subcutaneous TID WC  . insulin glargine  26 Units Subcutaneous QHS  . irbesartan  300 mg Oral QHS  . pantoprazole  40 mg Oral Daily  . simvastatin  40 mg Oral QPM  . sodium chloride  3 mL Intravenous Q12H  . sodium chloride  3 mL Intravenous Q12H   Continuous Infusions:  PRN Meds:.sodium chloride, acetaminophen **OR** acetaminophen, ipratropium-albuterol, nitroGLYCERIN, ondansetron **OR** ondansetron (ZOFRAN) IV, sodium chloride  Antibiotics    Anti-infectives    None        Subjective:   [name] seen and examined  today.  Patient states she is feeling better.  She states she has not had any more bowel movements since yesterday. She denies nausea, vomiting, chest pain, SOB.  She does complain of feeling dizzy when standing.  Objective:   Filed Vitals:   10/22/14 2144 10/23/14 0500 10/23/14 0810 10/23/14 1003  BP: 129/50 114/50 119/49 128/43  Pulse: 84  67 63  Temp: 97.8 F (36.6 C) 98.2 F (36.8 C)  98 F (36.7 C)  TempSrc: Oral Oral  Oral  Resp: 18 20  18   Weight:  79 kg (174 lb 2.6 oz)    SpO2: 93% 99%  100%    Wt Readings from Last 3 Encounters:  10/23/14 79 kg (174 lb 2.6 oz)  07/18/14 77.111 kg (170 lb)  07/17/14 78.926 kg (174 lb)     Intake/Output Summary (Last 24 hours) at 10/23/14 1029 Last data filed at 10/22/14 2300  Gross per 24 hour  Intake    815 ml  Output      0 ml  Net    815 ml    Exam  General: Well developed, well nourished, NAD, appears stated age  HEENT: NCAT,  mucous membranes moist.  Trach  Cardiovascular: S1 S2 auscultated, no rubs, murmurs or gallops. Regular rate and rhythm.  Respiratory: Clear to auscultation bilaterally with equal chest rise  Abdomen: Soft, nontender, nondistended, + bowel sounds  Extremities: warm dry without cyanosis clubbing or edema  Neuro: AAOx3, no focal deficits  Psych: Normal affect and demeanor with intact judgement and insight  Data Review   Micro Results No results found for this or any previous visit (from the past 240 hour(s)).  Radiology Reports Nm Gi Blood Loss  10/22/2014   CLINICAL DATA:  Gastrointestinal hemorrhage, unspecified gastritis.  EXAM: NUCLEAR MEDICINE GASTROINTESTINAL BLEEDING SCAN  TECHNIQUE: Sequential abdominal images were obtained following intravenous administration of Tc-8533m labeled red blood cells.  RADIOPHARMACEUTICALS:  25 mCi Tc-6833m in-vitro labeled red cells.  COMPARISON:  None.  FINDINGS: Normal biodistribution of the radiotracer. Linear activity in the pelvis is present on the  initial image and does not increase or travel typical of GI hemorrhage. No active GI hemorrhage suspected.  IMPRESSION: Negative.    The source of GI hemorrhage is not identified.   Electronically Signed   By: Tiburcio PeaJonathan  Watts M.D.   On: 10/22/2014 21:31    CBC  Recent Labs Lab 10/22/14 1602 10/22/14 2150 10/23/14 0545  WBC 6.2  --  5.7  HGB 7.6* 8.7* 7.9*  HCT 23.6* 27.2* 24.2*  PLT 155  --  133*  MCV 91.5  --  89.0  MCH 29.5  --  29.0  MCHC 32.2  --  32.6  RDW 16.2*  --  17.7*    Chemistries   Recent Labs Lab 10/22/14 1602 10/23/14 0545  NA 141 144  K 4.2 3.7  CL 108  109  CO2 26 28  GLUCOSE 227* 75  BUN 27* 30*  CREATININE 1.30* 1.28*  CALCIUM 8.4 8.3*  AST 18  --   ALT 14  --   ALKPHOS 74  --   BILITOT 0.7  --    ------------------------------------------------------------------------------------------------------------------ estimated creatinine clearance is 41 mL/min (by C-G formula based on Cr of 1.28). ------------------------------------------------------------------------------------------------------------------ No results for input(s): HGBA1C in the last 72 hours. ------------------------------------------------------------------------------------------------------------------ No results for input(s): CHOL, HDL, LDLCALC, TRIG, CHOLHDL, LDLDIRECT in the last 72 hours. ------------------------------------------------------------------------------------------------------------------ No results for input(s): TSH, T4TOTAL, T3FREE, THYROIDAB in the last 72 hours.  Invalid input(s): FREET3 ------------------------------------------------------------------------------------------------------------------ No results for input(s): VITAMINB12, FOLATE, FERRITIN, TIBC, IRON, RETICCTPCT in the last 72 hours.  Coagulation profile  Recent Labs Lab 10/22/14 1602  INR 1.35    No results for input(s): DDIMER in the last 72 hours.  Cardiac Enzymes No results for  input(s): CKMB, TROPONINI, MYOGLOBIN in the last 168 hours.  Invalid input(s): CK ------------------------------------------------------------------------------------------------------------------ Invalid input(s): POCBNP    Chosen Geske D.O. on 10/23/2014 at 10:29 AM  Between 7am to 7pm - Pager - [phone]  After 7pm go to [email or web address] - password [email or web address]  And look for the night coverage person covering for me after hours  [facility] Office  [phone]

## 2014-10-24 LAB — BASIC METABOLIC PANEL
ANION GAP: 10 (ref 5–15)
BUN: 20 mg/dL (ref 6–23)
CHLORIDE: 110 meq/L (ref 96–112)
CO2: 22 mmol/L (ref 19–32)
Calcium: 8.1 mg/dL — ABNORMAL LOW (ref 8.4–10.5)
Creatinine, Ser: 1.14 mg/dL — ABNORMAL HIGH (ref 0.50–1.10)
GFR calc Af Amer: 56 mL/min — ABNORMAL LOW (ref >90)
GFR calc non Af Amer: 48 mL/min — ABNORMAL LOW (ref >90)
Glucose, Bld: 81 mg/dL (ref 70–99)
POTASSIUM: 3.5 mmol/L (ref 3.5–5.1)
SODIUM: 142 mmol/L (ref 135–145)

## 2014-10-24 LAB — CBC
HCT: 23.8 % — ABNORMAL LOW (ref 36.0–46.0)
Hemoglobin: 7.7 g/dL — ABNORMAL LOW (ref 12.0–15.0)
MCH: 29.2 pg (ref 26.0–34.0)
MCHC: 32.4 g/dL (ref 30.0–36.0)
MCV: 90.2 fL (ref 78.0–100.0)
Platelets: 138 10*3/uL — ABNORMAL LOW (ref 150–400)
RBC: 2.64 MIL/uL — AB (ref 3.87–5.11)
RDW: 17.5 % — ABNORMAL HIGH (ref 11.5–15.5)
WBC: 4.7 10*3/uL (ref 4.0–10.5)

## 2014-10-24 LAB — HEMOGLOBIN AND HEMATOCRIT, BLOOD
HCT: 27.7 % — ABNORMAL LOW (ref 36.0–46.0)
HEMATOCRIT: 28.1 % — AB (ref 36.0–46.0)
Hemoglobin: 9.1 g/dL — ABNORMAL LOW (ref 12.0–15.0)
Hemoglobin: 9.1 g/dL — ABNORMAL LOW (ref 12.0–15.0)

## 2014-10-24 LAB — GLUCOSE, CAPILLARY
GLUCOSE-CAPILLARY: 137 mg/dL — AB (ref 70–99)
GLUCOSE-CAPILLARY: 149 mg/dL — AB (ref 70–99)
GLUCOSE-CAPILLARY: 208 mg/dL — AB (ref 70–99)
GLUCOSE-CAPILLARY: 87 mg/dL (ref 70–99)
GLUCOSE-CAPILLARY: 92 mg/dL (ref 70–99)

## 2014-10-24 LAB — PREPARE RBC (CROSSMATCH)

## 2014-10-24 MED ORDER — SODIUM CHLORIDE 0.9 % IV SOLN
Freq: Once | INTRAVENOUS | Status: AC
  Administered 2014-10-24: 10:00:00 via INTRAVENOUS

## 2014-10-24 MED ORDER — FUROSEMIDE 10 MG/ML IJ SOLN
20.0000 mg | Freq: Once | INTRAMUSCULAR | Status: AC
  Administered 2014-10-24: 20 mg via INTRAVENOUS
  Filled 2014-10-24: qty 4

## 2014-10-24 NOTE — Progress Notes (Signed)
Pt had loose, watery brown bowel movement, medium in size.   [name] I 10/24/2014 11:08 AM

## 2014-10-24 NOTE — Progress Notes (Signed)
[facility] Progress Note  Subjective: Patient feels okay passing less blood, no abdominal pain.  Objective: Vital signs in last 24 hours: Temp:  [97.8 F (36.6 C)-98.2 F (36.8 C)] 98.1 F (36.7 C) (01/01 0954) Pulse Rate:  [51-73] 58 (01/01 0954) Resp:  [18-21] 18 (01/01 0954) BP: (111-143)/(43-98) 118/43 mmHg (01/01 0954) SpO2:  [94 %-100 %] 94 % (01/01 0954) FiO2 (%):  [28 %] 28 % (01/01 0824) Weight:  [80.74 kg (178 lb)] 80.74 kg (178 lb) (01/01 0551) Weight change: 1.74 kg (3 lb 13.4 oz)   PE: Abdomen soft nontender  Lab Results: Results for orders placed or performed during the hospital encounter of 10/22/14 (from the past 24 hour(s))  Glucose, capillary     Status: Abnormal   Collection Time: 10/23/14 11:51 AM  Result Value Ref Range   Glucose-Capillary 65 (L) 70 - 99 mg/dL   Comment 1 Documented in Chart    Comment 2 Notify RN   Glucose, capillary     Status: Abnormal   Collection Time: 10/23/14 12:37 PM  Result Value Ref Range   Glucose-Capillary 127 (H) 70 - 99 mg/dL   Comment 1 Notify RN    Comment 2 Documented in Chart   Clostridium Difficile by PCR     Status: None   Collection Time: 10/23/14  1:37 PM  Result Value Ref Range   C difficile by pcr NEGATIVE NEGATIVE  Hemoglobin and hematocrit, blood     Status: Abnormal   Collection Time: 10/23/14  2:00 PM  Result Value Ref Range   Hemoglobin 8.1 (L) 12.0 - 15.0 g/dL   HCT 40.9 (L) 81.1 - 91.4 %  Glucose, capillary     Status: Abnormal   Collection Time: 10/23/14  5:13 PM  Result Value Ref Range   Glucose-Capillary 131 (H) 70 - 99 mg/dL   Comment 1 Notify RN    Comment 2 Documented in Chart   Glucose, capillary     Status: Abnormal   Collection Time: 10/23/14  9:20 PM  Result Value Ref Range   Glucose-Capillary 134 (H) 70 - 99 mg/dL   Comment 1 Documented in Chart    Comment 2 Notify RN   Hemoglobin and hematocrit, blood     Status: Abnormal   Collection Time: 10/23/14  9:33 PM  Result  Value Ref Range   Hemoglobin 7.9 (L) 12.0 - 15.0 g/dL   HCT 78.2 (L) 95.6 - 21.3 %  Glucose, capillary     Status: None   Collection Time: 10/24/14 12:36 AM  Result Value Ref Range   Glucose-Capillary 87 70 - 99 mg/dL   Comment 1 Documented in Chart    Comment 2 Notify RN   CBC     Status: Abnormal   Collection Time: 10/24/14  6:25 AM  Result Value Ref Range   WBC 4.7 4.0 - 10.5 K/uL   RBC 2.64 (L) 3.87 - 5.11 MIL/uL   Hemoglobin 7.7 (L) 12.0 - 15.0 g/dL   HCT 08.6 (L) 57.8 - 46.9 %   MCV 90.2 78.0 - 100.0 fL   MCH 29.2 26.0 - 34.0 pg   MCHC 32.4 30.0 - 36.0 g/dL   RDW 62.9 (H) 52.8 - 41.3 %   Platelets 138 (L) 150 - 400 K/uL  Basic metabolic panel     Status: Abnormal   Collection Time: 10/24/14  6:25 AM  Result Value Ref Range   Sodium 142 135 - 145 mmol/L   Potassium 3.5 3.5 - 5.1  mmol/L   Chloride 110 96 - 112 mEq/L   CO2 22 19 - 32 mmol/L   Glucose, Bld 81 70 - 99 mg/dL   BUN 20 6 - 23 mg/dL   Creatinine, Ser 6.04 (H) 0.50 - 1.10 mg/dL   Calcium 8.1 (L) 8.4 - 10.5 mg/dL   GFR calc non Af Amer 48 (L) >90 mL/min   GFR calc Af Amer 56 (L) >90 mL/min   Anion gap 10 5 - 15  Glucose, capillary     Status: None   Collection Time: 10/24/14  7:46 AM  Result Value Ref Range   Glucose-Capillary 92 70 - 99 mg/dL   Comment 1 Notify RN    Comment 2 Documented in Chart     Studies/Results: Nm Gi Blood Loss  10/22/2014   CLINICAL DATA:  Gastrointestinal hemorrhage, unspecified gastritis.  EXAM: NUCLEAR MEDICINE GASTROINTESTINAL BLEEDING SCAN  TECHNIQUE: Sequential abdominal images were obtained following intravenous administration of Tc-43m labeled red blood cells.  RADIOPHARMACEUTICALS:  25 mCi Tc-49m in-vitro labeled red cells.  COMPARISON:  None.  FINDINGS: Normal biodistribution of the radiotracer. Linear activity in the pelvis is present on the initial image and does not increase or travel typical of GI hemorrhage. No active GI hemorrhage suspected.  IMPRESSION: Negative.     The source of GI hemorrhage is not identified.   Electronically Signed   By: Tiburcio Pea M.D.   On: 10/22/2014 21:31      Assessment: Bloody diarrhea preceded by 2 or 3 weeks of nonbloody diarrhea, no pain to suggest ischemic colitis, pooled RBC scan negative. C. difficile toxin negative  Plan: Keep on clear liquid diet, continue to monitor stools and hemoglobin, await GI pathogens panel. We'll probably need sigmoidoscopy or colonoscopy at some point.    Katoria Yetman C 10/24/2014, 10:40 AM

## 2014-10-24 NOTE — Progress Notes (Signed)
[facility]                                                                              Patient Demographics  [name], is a 69 y.o. female, DOB - Dec 26, 1945, RUE:[id number]  Admit date - 10/22/2014   Admitting Physician Edsel Petrin, DO  Outpatient Primary MD for the patient is Cala Bradford, MD  LOS - 2   Chief Complaint  Patient presents with  . GI Bleeding      HPI on 10/22/2014  [name] is a 69 y.o. female with a history of CHF with an EF of 25%, diabetes mellitus, hypertension, chronic respiratory failure requiring trach presented to the emergency department with complaints of diarrhea and bloody stools which started early this morning. Patient stated she had approximate 5-6 episodes before coming to the hospital. She noted that she had dark red blood in her stool which then became bright red blood. Patient did have some pass some clots. She denies taking any anti-inflammatories. Does state that she takes daily aspirin. Patient states her last colonoscopy was approximately 8 years ago which was done in [place]. At this time she denies any abdominal pain, nausea or vomiting. Patient does admit to having some bowel incontinence. She denies any chest pain or shortness of breath. Patient was noted to have several bloody bowel movements in the emergency department. Gastroenterology was called. [facility] was asked to admit.   Assessment & Plan   Lower GI Bleeding/Hematochezia -Patient currently hemodynamically stable -Will monitor hemoglobin every 8 hours  -Received 1uPRBC upon admission, Hb currently 7.7 -Will order another unit of PRBCs -Gastroenterology consulted and appreciated and recommended GI pathogen panel and possible sigmoidoscopy -Bleeding scan was negative -Placed on clear liquid diet  -Witnessed patient's BM this morning, minimal blood seen.  Diarrhea -Likely secondary to patient's GI bleeding -C. difficile PCR negative -GI pathogen panel  pending  -Unlikely infectious as patient is afebrile with no leukocytosis  Acute blood loss Anemia/Symptomatic anemia  -Secondary to GI bleeding -Patient's hemoglobin appears to be around 11 baseline -Treatment plan as above -Patient does complain of dizziness with standing  Chronic combined systolic and diastolic heart failure -Patient currently euvolemic -Echocardiogram in January 2014 showed an EF of 25-30%, grade 2 diastolic dysfunction -Will monitor daily weights, intake and output -Continue Lasix, coreg, avapro  Coronary artery disease -Currently stable, patient chest pain free -Will hold plavix and aspirin at this due to GI bleed  Hypertension -Continue Avapro, Coreg with holding parameters  Chronic respiratory failure/trach -Stable  Hyperlipidemia -Continue statin  Diabetes mellitus, type II -Continue insulin sliding scale was CBG monitoring -Will hold patient's home regimen as she will be placed on a clear liquid diet  Chronic kidney disease, stage III -Creatinine appears to be at baseline we'll continue to monitor BMP  Depression -Continue Cymbalta  Code Status: Full  Family Communication: Daughter at bedside  Disposition Plan: Admitted  Time Spent in minutes   30 minutes  Procedures  None  Consults   Gastroenterology, [facility]  DVT Prophylaxis  SCDs  Lab Results  Component Value Date   PLT 138* 10/24/2014    Medications  Scheduled Meds: . carvedilol  25  mg Oral BID WC  . DULoxetine  60 mg Oral Daily  . furosemide  20 mg Intravenous Once  . furosemide  40 mg Oral BID  . gabapentin  300 mg Oral BID  . insulin aspart  0-9 Units Subcutaneous TID WC  . insulin glargine  20 Units Subcutaneous QHS  . irbesartan  300 mg Oral QHS  . pantoprazole  40 mg Oral Daily  . simvastatin  40 mg Oral QPM  . sodium chloride  3 mL Intravenous Q12H  . sodium chloride  3 mL Intravenous Q12H   Continuous Infusions: . dextrose 5 % and 0.45% NaCl 50 mL/hr at  10/23/14 1857   PRN Meds:.sodium chloride, acetaminophen **OR** acetaminophen, dextrose, ipratropium-albuterol, nitroGLYCERIN, ondansetron **OR** ondansetron (ZOFRAN) IV, sodium chloride  Antibiotics    Anti-infectives    None        Subjective:   Alisa Graff seen and examined today.  Patient states she is feeling better, but she still complains of dizziness with standing.  She denies nausea, vomiting, chest pain, SOB.   Objective:   Filed Vitals:   10/24/14 0445 10/24/14 0551 10/24/14 0824 10/24/14 0939  BP:  130/98  111/44  Pulse: 65 62  51  Temp:  98.1 F (36.7 C)  97.8 F (36.6 C)  TempSrc:  Oral  Oral  Resp: Height:      Weight:  80.74 kg (178 lb)    SpO2: 97% 97% 96% 94%    Wt Readings from Last 3 Encounters:  10/24/14 80.74 kg (178 lb)  07/18/14 77.111 kg (170 lb)  07/17/14 78.926 kg (174 lb)    No intake or output data in the 24 hours ending 10/24/14 0942  Exam  General: Well developed, well nourished, NAD, appears stated age  HEENT: NCAT,  mucous membranes moist.  Trach  Cardiovascular: S1 S2 auscultated, no rubs, murmurs or gallops. Regular rate and rhythm.  Respiratory: Clear to auscultation bilaterally with equal chest rise  Abdomen: Soft, obese, nontender, nondistended, + bowel sounds  Extremities: warm dry without cyanosis clubbing or edema  Neuro: AAOx3, no focal deficits  Psych: Appropriate mood and affect  Data Review   Micro Results Recent Results (from the past 240 hour(s))  Clostridium Difficile by PCR     Status: None   Collection Time: 10/23/14  1:37 PM  Result Value Ref Range Status   C difficile by pcr NEGATIVE NEGATIVE Final    Radiology Reports Nm Gi Blood Loss  10/22/2014   CLINICAL DATA:  Gastrointestinal hemorrhage, unspecified gastritis.  EXAM: NUCLEAR MEDICINE GASTROINTESTINAL BLEEDING SCAN  TECHNIQUE: Sequential abdominal images were obtained following intravenous administration of Tc-49m labeled red  blood cells.  RADIOPHARMACEUTICALS:  25 mCi Tc-33m in-vitro labeled red cells.  COMPARISON:  None.  FINDINGS: Normal biodistribution of the radiotracer. Linear activity in the pelvis is present on the initial image and does not increase or travel typical of GI hemorrhage. No active GI hemorrhage suspected.  IMPRESSION: Negative.    The source of GI hemorrhage is not identified.   Electronically Signed   By: Tiburcio Pea M.D.   On: 10/22/2014 21:31    CBC  Recent Labs Lab 10/22/14 1602 10/22/14 2150 10/23/14 0545 10/23/14 1400 10/23/14 2133 10/24/14 0625  WBC 6.2  --  5.7  --   --  4.7  HGB 7.6* 8.7* 7.9* 8.1* 7.9* 7.7*  HCT 23.6* 27.2* 24.2* 25.0* 24.8* 23.8*  PLT 155  --  133*  --   --  138*  MCV 91.5  --  89.0  --   --  90.2  MCH 29.5  --  29.0  --   --  29.2  MCHC 32.2  --  32.6  --   --  32.4  RDW 16.2*  --  17.7*  --   --  17.5*    Chemistries   Recent Labs Lab 10/22/14 1602 10/23/14 0545 10/24/14 0625  NA 141 144 142  K 4.2 3.7 3.5  CL 108 109 110  CO2 GLUCOSE 227* 75 81  BUN 27* 30* 20  CREATININE 1.30* 1.28* 1.14*  CALCIUM 8.4 8.3* 8.1*  AST 18  --   --   ALT 14  --   --   ALKPHOS 74  --   --   BILITOT 0.7  --   --    ------------------------------------------------------------------------------------------------------------------ estimated creatinine clearance is 46.5 mL/min (by C-G formula based on Cr of 1.14). ------------------------------------------------------------------------------------------------------------------ No results for input(s): HGBA1C in the last 72 hours. ------------------------------------------------------------------------------------------------------------------ No results for input(s): CHOL, HDL, LDLCALC, TRIG, CHOLHDL, LDLDIRECT in the last 72 hours. ------------------------------------------------------------------------------------------------------------------ No results for input(s): TSH, T4TOTAL, T3FREE,  THYROIDAB in the last 72 hours.  Invalid input(s): FREET3 ------------------------------------------------------------------------------------------------------------------ No results for input(s): VITAMINB12, FOLATE, FERRITIN, TIBC, IRON, RETICCTPCT in the last 72 hours.  Coagulation profile  Recent Labs Lab 10/22/14 1602  INR 1.35    No results for input(s): DDIMER in the last 72 hours.  Cardiac Enzymes No results for input(s): CKMB, TROPONINI, MYOGLOBIN in the last 168 hours.  Invalid input(s): CK ------------------------------------------------------------------------------------------------------------------ Invalid input(s): POCBNP    Terricka Onofrio D.O. on 10/24/2014 at 9:42 AM  Between 7am to 7pm - Pager - [phone]  After 7pm go to [email or web address] - password [email or web address]  And look for the night coverage person covering for me after hours  [facility] Office  [phone]

## 2014-10-24 NOTE — Progress Notes (Signed)
Patient stated that she sleeps on oxygen at home.  Placed patient on aerosol trach collar at 28%.  Patient said that she would remove her passy muir valve when she was ready to go to sleep.  RT will continue to monitor.

## 2014-10-25 LAB — CBC
HCT: 29.3 % — ABNORMAL LOW (ref 36.0–46.0)
Hemoglobin: 9.4 g/dL — ABNORMAL LOW (ref 12.0–15.0)
MCH: 28.8 pg (ref 26.0–34.0)
MCHC: 32.1 g/dL (ref 30.0–36.0)
MCV: 89.9 fL (ref 78.0–100.0)
Platelets: 159 10*3/uL (ref 150–400)
RBC: 3.26 MIL/uL — AB (ref 3.87–5.11)
RDW: 17.3 % — ABNORMAL HIGH (ref 11.5–15.5)
WBC: 5.8 10*3/uL (ref 4.0–10.5)

## 2014-10-25 LAB — GLUCOSE, CAPILLARY
GLUCOSE-CAPILLARY: 82 mg/dL (ref 70–99)
GLUCOSE-CAPILLARY: 172 mg/dL — AB (ref 70–99)
GLUCOSE-CAPILLARY: 136 mg/dL — AB (ref 70–99)
Glucose-Capillary: 94 mg/dL (ref 70–99)
Glucose-Capillary: 66 mg/dL — ABNORMAL LOW (ref 70–99)

## 2014-10-25 LAB — BASIC METABOLIC PANEL
Anion gap: 13 (ref 5–15)
BUN: 13 mg/dL (ref 6–23)
CALCIUM: 7.9 mg/dL — AB (ref 8.4–10.5)
CO2: 20 mmol/L (ref 19–32)
Chloride: 109 mEq/L (ref 96–112)
Creatinine, Ser: 1.14 mg/dL — ABNORMAL HIGH (ref 0.50–1.10)
GFR calc Af Amer: 56 mL/min — ABNORMAL LOW (ref >90)
GFR, EST NON AFRICAN AMERICAN: 48 mL/min — AB (ref >90)
Glucose, Bld: 115 mg/dL — ABNORMAL HIGH (ref 70–99)
POTASSIUM: 3.6 mmol/L (ref 3.5–5.1)
SODIUM: 142 mmol/L (ref 135–145)

## 2014-10-25 LAB — TYPE AND SCREEN
ABO/RH(D): O POS
ANTIBODY SCREEN: NEGATIVE
UNIT DIVISION: 0
Unit division: 0

## 2014-10-25 LAB — HEMOGLOBIN AND HEMATOCRIT, BLOOD
HCT: 27.9 % — ABNORMAL LOW (ref 36.0–46.0)
HEMOGLOBIN: 9.1 g/dL — AB (ref 12.0–15.0)

## 2014-10-25 NOTE — Progress Notes (Signed)
10/24/14 2056 hrs, x1 loose watery stool brown color, no obvious bleeding, small amount 100 ml. Also 2133 hrs, x1 watery brown stool, no obvious bleeding noted, similar quantity approx. 100 ml.

## 2014-10-25 NOTE — Progress Notes (Signed)
Pt had small loose, light brown bowel movement, nothing formed. Will monitor   [name] I . 10/25/2014  6:21 PM

## 2014-10-25 NOTE — Progress Notes (Signed)
Pt had large loose, watery brown bowel movement in [facility].  Andrew Au I 10/25/2014 12:52 PM

## 2014-10-25 NOTE — Progress Notes (Signed)
[facility] Progress Note  Subjective: Still having diarrhea but no more blood brown liquid stool in the commode, no abdominal pain  Objective: Vital signs in last 24 hours: Temp:  [97.5 F (36.4 C)-98.3 F (36.8 C)] 98.3 F (36.8 C) (01/02 0703) Pulse Rate:  [61-73] 62 (01/02 0900) Resp:  [16-20] 20 (01/02 0900) BP: (111-135)/(43-56) 111/48 mmHg (01/02 0900) SpO2:  [94 %-98 %] 96 % (01/02 0900) Weight:  [79.107 kg (174 lb 6.4 oz)] 79.107 kg (174 lb 6.4 oz) (01/02 0703) Weight change:    PE: Abdomen soft nondistended with normoactive bowel sounds  Lab Results: Results for orders placed or performed during the hospital encounter of 10/22/14 (from the past 24 hour(s))  Hemoglobin and hematocrit, blood     Status: Abnormal   Collection Time: 10/24/14  2:22 PM  Result Value Ref Range   Hemoglobin 9.1 (L) 12.0 - 15.0 g/dL   HCT 16.1 (L) 09.6 - 04.5 %  Glucose, capillary     Status: Abnormal   Collection Time: 10/24/14  4:31 PM  Result Value Ref Range   Glucose-Capillary 208 (H) 70 - 99 mg/dL   Comment 1 Notify RN    Comment 2 Documented in Chart   Glucose, capillary     Status: Abnormal   Collection Time: 10/24/14 10:02 PM  Result Value Ref Range   Glucose-Capillary 149 (H) 70 - 99 mg/dL   Comment 1 Documented in Chart    Comment 2 Notify RN   Hemoglobin and hematocrit, blood     Status: Abnormal   Collection Time: 10/24/14 10:30 PM  Result Value Ref Range   Hemoglobin 9.1 (L) 12.0 - 15.0 g/dL   HCT 40.9 (L) 81.1 - 91.4 %  CBC     Status: Abnormal   Collection Time: 10/25/14  5:24 AM  Result Value Ref Range   WBC 5.8 4.0 - 10.5 K/uL   RBC 3.26 (L) 3.87 - 5.11 MIL/uL   Hemoglobin 9.4 (L) 12.0 - 15.0 g/dL   HCT 78.2 (L) 95.6 - 21.3 %   MCV 89.9 78.0 - 100.0 fL   MCH 28.8 26.0 - 34.0 pg   MCHC 32.1 30.0 - 36.0 g/dL   RDW 08.6 (H) 57.8 - 46.9 %   Platelets 159 150 - 400 K/uL  Basic metabolic panel     Status: Abnormal   Collection Time: 10/25/14  5:24 AM   Result Value Ref Range   Sodium 142 135 - 145 mmol/L   Potassium 3.6 3.5 - 5.1 mmol/L   Chloride 109 96 - 112 mEq/L   CO2 20 19 - 32 mmol/L   Glucose, Bld 115 (H) 70 - 99 mg/dL   BUN 13 6 - 23 mg/dL   Creatinine, Ser 6.29 (H) 0.50 - 1.10 mg/dL   Calcium 7.9 (L) 8.4 - 10.5 mg/dL   GFR calc non Af Amer 48 (L) >90 mL/min   GFR calc Af Amer 56 (L) >90 mL/min   Anion gap 13 5 - 15  Glucose, capillary     Status: None   Collection Time: 10/25/14  7:10 AM  Result Value Ref Range   Glucose-Capillary 94 70 - 99 mg/dL   Comment 1 Documented in Chart    Comment 2 Notify RN   Glucose, capillary     Status: Abnormal   Collection Time: 10/25/14 11:47 AM  Result Value Ref Range   Glucose-Capillary 172 (H) 70 - 99 mg/dL   Comment 1 Notify RN  Studies/Results: No results found.    Assessment: Persistent diarrhea with some intermittent rectal bleeding seems to have stopped, pooled RBC scan negative  Plan: Await GI pathogens panel will probably need flexible sigmoidoscopy or colonoscopy in the next day or 2. She is willing to stay on clear liquid diet for now    Tramya Schoenfelder C 10/25/2014, 12:38 PM

## 2014-10-25 NOTE — Progress Notes (Signed)
Chart reviewed.    [facility]                                                                              Patient Demographics  [name], is a 69 y.o. female, DOB - January 26, 1946, ZOX:[id number]  Admit date - 10/22/2014   Admitting Physician Edsel Petrin, DO  Outpatient Primary MD for the patient is Cala Bradford, MD  LOS - 3   Chief Complaint  Patient presents with  . GI Bleeding      HPI on 10/22/2014  [name] is a 69 y.o. female with a history of CHF with an EF of 25%, diabetes mellitus, hypertension, chronic respiratory failure requiring trach presented to the emergency department with complaints of diarrhea and bloody stools which started early this morning. Patient stated she had approximate 5-6 episodes before coming to the hospital. She noted that she had dark red blood in her stool which then became bright red blood. Patient did have some pass some clots. She denies taking any anti-inflammatories. Does state that she takes daily aspirin. Patient states her last colonoscopy was approximately 8 years ago which was done in [place]. At this time she denies any abdominal pain, nausea or vomiting. Patient does admit to having some bowel incontinence. She denies any chest pain or shortness of breath. Patient was noted to have several bloody bowel movements in the emergency department. Gastroenterology was called. [facility] was asked to admit.   Assessment & Plan   Lower GI Bleeding/Hematochezia/diarrhea S/p 2 units prbc -Bleeding scan was negative -Placed on clear liquid diet  -C. difficile PCR negative -GI pathogen panel pending. If negative, likely will need flex sig Still with diarrhea, though less frequent, less bloody  Acute blood loss Anemia/Symptomatic anemia  Stable post 2 units prbc. Baseline hgb 11  Chronic combined systolic and diastolic heart failure -Patient currently euvolemic -Echocardiogram in January 2014 showed an EF of 25-30%, grade 2  diastolic dysfunction -Will monitor daily weights, intake and output -Continue Lasix, coreg, avapro. D/c IVF  Coronary artery disease -Currently stable, patient chest pain free -Will hold plavix and aspirin at this due to GI bleed  Hypertension -Continue Avapro, Coreg with holding parameters  Chronic respiratory failure/trach -Stable  Hyperlipidemia -Continue statin  Diabetes mellitus, type II -Continue insulin sliding scale was CBG monitoring -Will hold patient's home regimen as she will be placed on a clear liquid diet   Chronic kidney disease, stage III -Creatinine appears to be at baseline we'll continue to monitor BMP  Depression -Continue Cymbalta  Code Status: Full  Family Communication: Daughter at bedside  Disposition Plan: Admitted  Time Spent in minutes   25 minutes  Procedures  None  Consults   Gastroenterology, [facility]  DVT Prophylaxis  SCDs  Lab Results  Component Value Date   PLT 159 10/25/2014    Medications  Scheduled Meds: . carvedilol  25 mg Oral BID WC  . DULoxetine  60 mg Oral Daily  . furosemide  40 mg Oral BID  . gabapentin  300 mg Oral BID  . insulin aspart  0-9 Units Subcutaneous TID WC  . insulin glargine  20 Units Subcutaneous QHS  . irbesartan  300 mg Oral QHS  . pantoprazole  40 mg Oral Daily  . simvastatin  40 mg Oral QPM  . sodium chloride  3 mL Intravenous Q12H  . sodium chloride  3 mL Intravenous Q12H   Continuous Infusions: . dextrose 5 % and 0.45% NaCl 50 mL/hr at 10/24/14 1416   PRN Meds:.sodium chloride, acetaminophen **OR** acetaminophen, ipratropium-albuterol, nitroGLYCERIN, ondansetron **OR** ondansetron (ZOFRAN) IV, sodium chloride  Antibiotics    Anti-infectives    None        Subjective:   Watery stool after each meal. Less frequent, less bloody  Objective:   Filed Vitals:   10/24/14 2129 10/25/14 0154 10/25/14 0703 10/25/14 0900  BP: 122/43 114/45 129/56 111/48  Pulse: 61 67 68 62  Temp:  97.9 F (36.6 C) 98.1 F (36.7 C) 98.3 F (36.8 C)   TempSrc: Oral Oral Oral   Resp: Height:      Weight:   79.107 kg (174 lb 6.4 oz)   SpO2: 94% 97% 95% 96%    Wt Readings from Last 3 Encounters:  10/25/14 79.107 kg (174 lb 6.4 oz)  07/18/14 77.111 kg (170 lb)  07/17/14 78.926 kg (174 lb)     Intake/Output Summary (Last 24 hours) at 10/25/14 1239 Last data filed at 10/25/14 1007  Gross per 24 hour  Intake   1185 ml  Output      0 ml  Net   1185 ml    Exam  General: Well developed, well nourished, NAD, appears stated age  HEENT: NCAT,  mucous membranes moist.  Trach  Cardiovascular: S1 S2 auscultated, no rubs, murmurs or gallops. Regular rate and rhythm.  Respiratory: Clear to auscultation bilaterally with equal chest rise  Abdomen: Soft, obese, nontender, nondistended, + bowel sounds  Extremities: warm dry without cyanosis clubbing or edema  Neuro: AAOx3, no focal deficits  Psych: Appropriate mood and affect  Data Review   Micro Results Recent Results (from the past 240 hour(s))  Clostridium Difficile by PCR     Status: None   Collection Time: 10/23/14  1:37 PM  Result Value Ref Range Status   C difficile by pcr NEGATIVE NEGATIVE Final    Radiology Reports Nm Gi Blood Loss  10/22/2014   CLINICAL DATA:  Gastrointestinal hemorrhage, unspecified gastritis.  EXAM: NUCLEAR MEDICINE GASTROINTESTINAL BLEEDING SCAN  TECHNIQUE: Sequential abdominal images were obtained following intravenous administration of Tc-53m labeled red blood cells.  RADIOPHARMACEUTICALS:  25 mCi Tc-98m in-vitro labeled red cells.  COMPARISON:  None.  FINDINGS: Normal biodistribution of the radiotracer. Linear activity in the pelvis is present on the initial image and does not increase or travel typical of GI hemorrhage. No active GI hemorrhage suspected.  IMPRESSION: Negative.    The source of GI hemorrhage is not identified.   Electronically Signed   By: Tiburcio Pea M.D.    On: 10/22/2014 21:31    CBC  Recent Labs Lab 10/22/14 1602  10/23/14 0545  10/23/14 2133 10/24/14 0625 10/24/14 1422 10/24/14 2230 10/25/14 0524  WBC 6.2  --  5.7  --   --  4.7  --   --  5.8  HGB 7.6*  < > 7.9*  < > 7.9* 7.7* 9.1* 9.1* 9.4*  HCT 23.6*  < > 24.2*  < > 24.8* 23.8* 28.1* 27.7* 29.3*  PLT 155  --  133*  --   --  138*  --   --  159  MCV 91.5  --  89.0  --   --  90.2  --   --  89.9  MCH 29.5  --  29.0  --   --  29.2  --   --  28.8  MCHC 32.2  --  32.6  --   --  32.4  --   --  32.1  RDW 16.2*  --  17.7*  --   --  17.5*  --   --  17.3*  < > = values in this interval not displayed.  Chemistries   Recent Labs Lab 10/22/14 1602 10/23/14 0545 10/24/14 0625 10/25/14 0524  NA 141 144 142 142  K 4.2 3.7 3.5 3.6  CL 108 109 110 109  CO2 GLUCOSE 227* 75 81 115*  BUN 27* 30* 20 13  CREATININE 1.30* 1.28* 1.14* 1.14*  CALCIUM 8.4 8.3* 8.1* 7.9*  AST 18  --   --   --   ALT 14  --   --   --   ALKPHOS 74  --   --   --   BILITOT 0.7  --   --   --    ------------------------------------------------------------------------------------------------------------------ estimated creatinine clearance is 46 mL/min (by C-G formula based on Cr of 1.14). ------------------------------------------------------------------------------------------------------------------ No results for input(s): HGBA1C in the last 72 hours. ------------------------------------------------------------------------------------------------------------------ No results for input(s): CHOL, HDL, LDLCALC, TRIG, CHOLHDL, LDLDIRECT in the last 72 hours. ------------------------------------------------------------------------------------------------------------------ No results for input(s): TSH, T4TOTAL, T3FREE, THYROIDAB in the last 72 hours.  Invalid input(s): FREET3 ------------------------------------------------------------------------------------------------------------------ No results for  input(s): VITAMINB12, FOLATE, FERRITIN, TIBC, IRON, RETICCTPCT in the last 72 hours.  Coagulation profile  Recent Labs Lab 10/22/14 1602  INR 1.35    No results for input(s): DDIMER in the last 72 hours.  Cardiac Enzymes No results for input(s): CKMB, TROPONINI, MYOGLOBIN in the last 168 hours.  Invalid input(s): CK ------------------------------------------------------------------------------------------------------------------ Invalid input(s): POCBNP    Jenness Stemler L MD on 10/25/2014 at 12:39 PM [email or web address] - password [facility]

## 2014-10-26 DIAGNOSIS — I10 Essential (primary) hypertension: Secondary | ICD-10-CM

## 2014-10-26 LAB — GLUCOSE, CAPILLARY
GLUCOSE-CAPILLARY: 57 mg/dL — AB (ref 70–99)
GLUCOSE-CAPILLARY: 105 mg/dL — AB (ref 70–99)
Glucose-Capillary: 85 mg/dL (ref 70–99)
Glucose-Capillary: 48 mg/dL — ABNORMAL LOW (ref 70–99)
Glucose-Capillary: 84 mg/dL (ref 70–99)
Glucose-Capillary: 94 mg/dL (ref 70–99)
Glucose-Capillary: 109 mg/dL — ABNORMAL HIGH (ref 70–99)

## 2014-10-26 LAB — HEMOGLOBIN AND HEMATOCRIT, BLOOD
HCT: 28.5 % — ABNORMAL LOW (ref 36.0–46.0)
HEMOGLOBIN: 8.8 g/dL — AB (ref 12.0–15.0)

## 2014-10-26 MED ORDER — SODIUM CHLORIDE 0.9 % IV SOLN
INTRAVENOUS | Status: DC
  Administered 2014-10-27: via INTRAVENOUS

## 2014-10-26 MED ORDER — PEG 3350-KCL-NA BICARB-NACL 420 G PO SOLR
4000.0000 mL | Freq: Once | ORAL | Status: AC
  Administered 2014-10-26: 4000 mL via ORAL
  Filled 2014-10-26: qty 4000

## 2014-10-26 MED ORDER — INSULIN GLARGINE 100 UNIT/ML ~~LOC~~ SOLN
8.0000 [IU] | Freq: Once | SUBCUTANEOUS | Status: DC
  Filled 2014-10-26: qty 0.08

## 2014-10-26 MED ORDER — INSULIN GLARGINE 100 UNIT/ML ~~LOC~~ SOLN
16.0000 [IU] | Freq: Every day | SUBCUTANEOUS | Status: DC
  Administered 2014-10-26: 8 [IU] via SUBCUTANEOUS
  Administered 2014-10-27 – 2014-10-28 (×2): 16 [IU] via SUBCUTANEOUS
  Filled 2014-10-26 (×4): qty 0.16

## 2014-10-26 NOTE — Progress Notes (Signed)
Called Md about BG of 109 with scheduled 16 units of Lantus. MD decreased the dose to 8 units and said to recheck between 0300-0400 and let her know the results. Will continue to monitor [place], Tomasa Dobransky E, RN  10/26/2014 10:10 PM

## 2014-10-26 NOTE — Progress Notes (Signed)
Patient's BG was 84 after two 4oz of juice. Will continue to monitor. [name], [place]  10/26/2014  7:36 AM

## 2014-10-26 NOTE — Progress Notes (Signed)
Patient had a loose medium stool brown in color from the rectum. Suzy Bouchard E, RN  10/25/2013 2014.

## 2014-10-26 NOTE — Progress Notes (Signed)
[facility]                                                                              Patient Demographics  [name], is a 69 y.o. female, DOB - September 19, 1946, ZOX:[id number]  Admit date - 10/22/2014   Admitting Physician Edsel Petrin, DO  Outpatient Primary MD for the patient is Cala Bradford, MD  LOS - 4   Chief Complaint  Patient presents with  . GI Bleeding      HPI on 10/22/2014  [name] is a 69 y.o. female with a history of CHF with an EF of 25%, diabetes mellitus, hypertension, chronic respiratory failure requiring trach presented to the emergency department with complaints of diarrhea and bloody stools which started early this morning. Patient stated she had approximate 5-6 episodes before coming to the hospital. She noted that she had dark red blood in her stool which then became bright red blood. Patient did have some pass some clots. She denies taking any anti-inflammatories. Does state that she takes daily aspirin. Patient states her last colonoscopy was approximately 8 years ago which was done in [place]. At this time she denies any abdominal pain, nausea or vomiting. Patient does admit to having some bowel incontinence. She denies any chest pain or shortness of breath. Patient was noted to have several bloody bowel movements in the emergency department. Gastroenterology was called. [facility] was asked to admit.   Assessment & Plan   Lower GI Bleeding/Hematochezia -Patient currently hemodynamically stable -Bleeding scan was negative -Received  2uPRBCs during this admission, Hb currently stable at 8.8 -Gastroenterology consulted and appreciated and recommended GI pathogen panel and possible sigmoidoscopy -Placed on clear liquid diet  -Patient states she is no longer having bloody BM, but still having diarrhea  Diarrhea -Likely secondary to patient's GI bleeding -C. difficile PCR negative -GI pathogen panel pending  -Unlikely infectious as patient is  afebrile with no leukocytosis  Acute blood loss Anemia/Symptomatic anemia  -Secondary to GI bleeding -Patient's hemoglobin appears to be around 11 baseline -Treatment plan as above -Patient does complain of dizziness with standing  Chronic combined systolic and diastolic heart failure -Patient currently euvolemic -Echocardiogram in January 2014 showed an EF of 25-30%, grade 2 diastolic dysfunction -Will monitor daily weights, intake and output -Continue Lasix, coreg, avapro  Coronary artery disease -Currently stable, patient chest pain free -Will hold plavix and aspirin at this due to GI bleed  Hypertension -Continue Avapro, Coreg with holding parameters  Chronic respiratory failure/trach -Stable  Hyperlipidemia -Continue statin  Diabetes mellitus, type II -Continue insulin sliding scale was CBG monitoring -Will hold patient's home regimen as she will be placed on a clear liquid diet  Chronic kidney disease, stage III -Creatinine appears to be at baseline we'll continue to monitor BMP  Depression -Continue Cymbalta  Code Status: Full  Family Communication: None at bedside  Disposition Plan: Admitted  Time Spent in minutes   30 minutes  Procedures  None  Consults   Gastroenterology, [facility]  DVT Prophylaxis  SCDs  Lab Results  Component Value Date   PLT 159 10/25/2014    Medications  Scheduled Meds: . carvedilol  25 mg Oral BID WC  .  DULoxetine  60 mg Oral Daily  . furosemide  40 mg Oral BID  . gabapentin  300 mg Oral BID  . insulin aspart  0-9 Units Subcutaneous TID WC  . insulin glargine  16 Units Subcutaneous QHS  . irbesartan  300 mg Oral QHS  . pantoprazole  40 mg Oral Daily  . simvastatin  40 mg Oral QPM  . sodium chloride  3 mL Intravenous Q12H  . sodium chloride  3 mL Intravenous Q12H   Continuous Infusions:   PRN Meds:.sodium chloride, acetaminophen **OR** acetaminophen, ipratropium-albuterol, nitroGLYCERIN, ondansetron **OR**  ondansetron (ZOFRAN) IV, sodium chloride  Antibiotics    Anti-infectives    None        Subjective:   [name] seen and examined today.  Patient states she is feeling better.  She states she is still having watery stools after each meal.  She denies seeing blood.  Denies chest pain, SOB, abdominal pain.  Objective:   Filed Vitals:   10/26/14 0500 10/26/14 0546 10/26/14 1007 10/26/14 1021  BP:  131/52 102/58   Pulse:  60 58 55  Temp:  98.3 F (36.8 C) 97.7 F (36.5 C)   TempSrc:  Oral Oral   Resp:  18 18 18   Height:      Weight: 81.149 kg (178 lb 14.4 oz)     SpO2:  100% 100% 95%    Wt Readings from Last 3 Encounters:  10/26/14 81.149 kg (178 lb 14.4 oz)  07/18/14 77.111 kg (170 lb)  07/17/14 78.926 kg (174 lb)     Intake/Output Summary (Last 24 hours) at 10/26/14 1047 Last data filed at 10/25/14 2200  Gross per 24 hour  Intake    753 ml  Output      0 ml  Net    753 ml    Exam  General: Well developed, well nourished, NAD, appears stated age  HEENT: NCAT,  mucous membranes moist.  Trach  Cardiovascular: S1 S2 auscultated, no rubs, murmurs or gallops. Regular rate and rhythm.  Respiratory: Clear to auscultation bilaterally with equal chest rise  Abdomen: Soft, obese, nontender, nondistended, + bowel sounds  Extremities: warm dry without cyanosis clubbing or edema  Neuro: AAOx3, no focal deficits  Psych: Appropriate mood and affect  Data Review   Micro Results Recent Results (from the past 240 hour(s))  Clostridium Difficile by PCR     Status: None   Collection Time: 10/23/14  1:37 PM  Result Value Ref Range Status   C difficile by pcr NEGATIVE NEGATIVE Final    Radiology Reports Nm Gi Blood Loss  10/22/2014   CLINICAL DATA:  Gastrointestinal hemorrhage, unspecified gastritis.  EXAM: NUCLEAR MEDICINE GASTROINTESTINAL BLEEDING SCAN  TECHNIQUE: Sequential abdominal images were obtained following intravenous administration of Tc-25m  labeled red blood cells.  RADIOPHARMACEUTICALS:  25 mCi Tc-55m in-vitro labeled red cells.  COMPARISON:  None.  FINDINGS: Normal biodistribution of the radiotracer. Linear activity in the pelvis is present on the initial image and does not increase or travel typical of GI hemorrhage. No active GI hemorrhage suspected.  IMPRESSION: Negative.    The source of GI hemorrhage is not identified.   Electronically Signed   By: Tiburcio Pea M.D.   On: 10/22/2014 21:31    CBC  Recent Labs Lab 10/22/14 1602  10/23/14 0545  10/24/14 0625 10/24/14 1422 10/24/14 2230 10/25/14 0524 10/25/14 1415 10/26/14 0401  WBC 6.2  --  5.7  --  4.7  --   --  5.8  --   --   HGB 7.6*  < > 7.9*  < > 7.7* 9.1* 9.1* 9.4* 9.1* 8.8*  HCT 23.6*  < > 24.2*  < > 23.8* 28.1* 27.7* 29.3* 27.9* 28.5*  PLT 155  --  133*  --  138*  --   --  159  --   --   MCV 91.5  --  89.0  --  90.2  --   --  89.9  --   --   MCH 29.5  --  29.0  --  29.2  --   --  28.8  --   --   MCHC 32.2  --  32.6  --  32.4  --   --  32.1  --   --   RDW 16.2*  --  17.7*  --  17.5*  --   --  17.3*  --   --   < > = values in this interval not displayed.  Chemistries   Recent Labs Lab 10/22/14 1602 10/23/14 0545 10/24/14 0625 10/25/14 0524  NA 141 144 142 142  K 4.2 3.7 3.5 3.6  CL 108 109 110 109  CO2 GLUCOSE 227* 75 81 115*  BUN 27* 30* 20 13  CREATININE 1.30* 1.28* 1.14* 1.14*  CALCIUM 8.4 8.3* 8.1* 7.9*  AST 18  --   --   --   ALT 14  --   --   --   ALKPHOS 74  --   --   --   BILITOT 0.7  --   --   --    ------------------------------------------------------------------------------------------------------------------ estimated creatinine clearance is 46.6 mL/min (by C-G formula based on Cr of 1.14). ------------------------------------------------------------------------------------------------------------------ No results for input(s): HGBA1C in the last 72  hours. ------------------------------------------------------------------------------------------------------------------ No results for input(s): CHOL, HDL, LDLCALC, TRIG, CHOLHDL, LDLDIRECT in the last 72 hours. ------------------------------------------------------------------------------------------------------------------ No results for input(s): TSH, T4TOTAL, T3FREE, THYROIDAB in the last 72 hours.  Invalid input(s): FREET3 ------------------------------------------------------------------------------------------------------------------ No results for input(s): VITAMINB12, FOLATE, FERRITIN, TIBC, IRON, RETICCTPCT in the last 72 hours.  Coagulation profile  Recent Labs Lab 10/22/14 1602  INR 1.35    No results for input(s): DDIMER in the last 72 hours.  Cardiac Enzymes No results for input(s): CKMB, TROPONINI, MYOGLOBIN in the last 168 hours.  Invalid input(s): CK ------------------------------------------------------------------------------------------------------------------ Invalid input(s): POCBNP    [name] D.O. on 10/26/2014 at 10:47 AM  Between 7am to 7pm - Pager - [phone]  After 7pm go to [email or web address] - password [email or web address]  And look for the night coverage person covering for me after hours  [facility] Office  [phone]

## 2014-10-26 NOTE — Progress Notes (Signed)
[facility] Progress Note  Subjective: Patient continues to have nonbloody diarrhea no more blood, still on clear liquid diet  Objective: Vital signs in last 24 hours: Temp:  [97.6 F (36.4 C)-98.3 F (36.8 C)] 97.9 F (36.6 C) (01/03 1415) Pulse Rate:  [55-61] 61 (01/03 1415) Resp:  [18-20] 20 (01/03 1415) BP: (102-133)/(40-58) 133/47 mmHg (01/03 1415) SpO2:  [94 %-100 %] 94 % (01/03 1415) FiO2 (%):  [28 %] 28 % (01/02 2157) Weight:  [81.149 kg (178 lb 14.4 oz)] 81.149 kg (178 lb 14.4 oz) (01/03 0500) Weight change:    PE: Unchanged  Lab Results: Results for orders placed or performed during the hospital encounter of 10/22/14 (from the past 24 hour(s))  Glucose, capillary     Status: Abnormal   Collection Time: 10/25/14  5:05 PM  Result Value Ref Range   Glucose-Capillary 66 (L) 70 - 99 mg/dL   Comment 1 Notify RN   Glucose, capillary     Status: Abnormal   Collection Time: 10/25/14  5:44 PM  Result Value Ref Range   Glucose-Capillary 136 (H) 70 - 99 mg/dL   Comment 1 Notify RN   Glucose, capillary     Status: None   Collection Time: 10/25/14  9:56 PM  Result Value Ref Range   Glucose-Capillary 85 70 - 99 mg/dL   Comment 1 Documented in Chart    Comment 2 Notify RN   Hemoglobin and hematocrit, blood     Status: Abnormal   Collection Time: 10/26/14  4:01 AM  Result Value Ref Range   Hemoglobin 8.8 (L) 12.0 - 15.0 g/dL   HCT 16.1 (L) 09.6 - 04.5 %    Studies/Results: No results found.    Assessment: Chronic diarrhea with previous rectal bleeding GI pathogen panel still pending. Last colonoscopy estimated to be greater than 10 years ago.  Plan: We will go ahead and plan colonoscopy tomorrow    Thelbert Gartin C 10/26/2014, 2:49 PM

## 2014-10-26 NOTE — Progress Notes (Signed)
Patient's BG at 0630 was 48 and was given 4 oz. of apple juice. I rechecked the sugar again in 15 mins. And it was 57. I gave her 4 oz. Of orange juice and will recheck again in 15 mins. [name] E, RN 10/26/2014 7:05 AM

## 2014-10-27 ENCOUNTER — Inpatient Hospital Stay (HOSPITAL_COMMUNITY): Payer: Medicare Other | Admitting: Certified Registered Nurse Anesthetist

## 2014-10-27 ENCOUNTER — Encounter (HOSPITAL_COMMUNITY)
Admission: EM | Disposition: A | Discharge status: Home / Self Care | Payer: Self-pay | Source: Home / Self Care | Attending: Internal Medicine

## 2014-10-27 ENCOUNTER — Encounter (HOSPITAL_COMMUNITY): Payer: Self-pay | Admitting: Certified Registered Nurse Anesthetist

## 2014-10-27 HISTORY — PX: COLONOSCOPY: SHX5424

## 2014-10-27 LAB — HEMOGLOBIN AND HEMATOCRIT, BLOOD
HCT: 29.9 % — ABNORMAL LOW (ref 36.0–46.0)
Hemoglobin: 9.3 g/dL — ABNORMAL LOW (ref 12.0–15.0)

## 2014-10-27 LAB — GLUCOSE, CAPILLARY
GLUCOSE-CAPILLARY: 57 mg/dL — AB (ref 70–99)
Glucose-Capillary: 85 mg/dL (ref 70–99)
Glucose-Capillary: 104 mg/dL — ABNORMAL HIGH (ref 70–99)
Glucose-Capillary: 55 mg/dL — ABNORMAL LOW (ref 70–99)
Glucose-Capillary: 107 mg/dL — ABNORMAL HIGH (ref 70–99)
Glucose-Capillary: 83 mg/dL (ref 70–99)
Glucose-Capillary: 195 mg/dL — ABNORMAL HIGH (ref 70–99)

## 2014-10-27 SURGERY — COLONOSCOPY
Anesthesia: Monitor Anesthesia Care

## 2014-10-27 MED ORDER — PROPOFOL 10 MG/ML IV BOLUS
INTRAVENOUS | Status: DC | PRN
  Administered 2014-10-27: 30 mg via INTRAVENOUS

## 2014-10-27 MED ORDER — MEPERIDINE HCL 25 MG/ML IJ SOLN
6.2500 mg | INTRAMUSCULAR | Status: DC | PRN
Start: 2014-10-27 — End: 2014-10-29

## 2014-10-27 MED ORDER — ONDANSETRON HCL 4 MG/2ML IJ SOLN
INTRAMUSCULAR | Status: DC | PRN
  Administered 2014-10-27: 4 mg via INTRAVENOUS

## 2014-10-27 MED ORDER — PROPOFOL INFUSION 10 MG/ML OPTIME
INTRAVENOUS | Status: DC | PRN
  Administered 2014-10-27: 50 ug/kg/min via INTRAVENOUS

## 2014-10-27 MED ORDER — DEXTROSE 50 % IV SOLN
1.0000 | Freq: Once | INTRAVENOUS | Status: AC
  Administered 2014-10-27: 50 mL via INTRAVENOUS
  Filled 2014-10-27: qty 50

## 2014-10-27 MED ORDER — PROMETHAZINE HCL 25 MG/ML IJ SOLN: 6.2500 mg | INTRAMUSCULAR | Status: DC | PRN

## 2014-10-27 MED ORDER — DEXTROSE 50 % IV SOLN
INTRAVENOUS | Status: AC
  Filled 2014-10-27: qty 50

## 2014-10-27 MED ORDER — CLOPIDOGREL BISULFATE 75 MG PO TABS
75.0000 mg | ORAL_TABLET | Freq: Every day | ORAL | Status: DC
  Administered 2014-10-27 – 2014-10-29 (×3): 75 mg via ORAL
  Filled 2014-10-27 (×3): qty 1

## 2014-10-27 MED ORDER — FENTANYL CITRATE 0.05 MG/ML IJ SOLN: 25.0000 ug | INTRAMUSCULAR | Status: DC | PRN

## 2014-10-27 MED ORDER — DEXTROSE 50 % IV SOLN
25.0000 mL | Freq: Once | INTRAVENOUS | Status: AC
  Administered 2014-10-27: 25 mL via INTRAVENOUS

## 2014-10-27 MED ORDER — LACTATED RINGERS IV SOLN
INTRAVENOUS | Status: DC | PRN
  Administered 2014-10-27: 15:00:00 via INTRAVENOUS

## 2014-10-27 NOTE — Anesthesia Preprocedure Evaluation (Addendum)
Anesthesia Evaluation  Patient identified by MRN, date of birth, ID band Patient awake    Reviewed: Allergy & Precautions, NPO status , Patient's Chart, lab work & pertinent test results, reviewed documented beta blocker date and time   Airway Mallampati: II   Neck ROM: Full    Dental  (+) Edentulous Upper, Edentulous Lower, Lower Dentures, Upper Dentures   Pulmonary COPDformer smoker (quit 2012),  TR[facility] breath sounds clear to auscultation        Cardiovascular hypertension, Pt. on medications + CAD, + CABG (1997) and +CHF (EF 25-30) Rhythm:Regular  ECHO EF 25-30% 20114   Neuro/Psych CVA    GI/Hepatic GERD-  Controlled,  Endo/Other  diabetes, Type 2, Insulin Dependent  Renal/GU Renal InsufficiencyRenal diseaseGFR 46     Musculoskeletal  (+) Arthritis -,   Abdominal (+) + obese,   Peds  Hematology  (+) anemia , 9/29 H/H 10/27/2014   Anesthesia Other Findings   Reproductive/Obstetrics                           Anesthesia Physical Anesthesia Plan  ASA: IV  Anesthesia Plan: MAC   Post-op Pain Management:    Induction: Intravenous  Airway Management Planned: Nasal Cannula  Additional Equipment:   Intra-op Plan:   Post-operative Plan:   Informed Consent: I have reviewed the patients History and Physical, chart, labs and discussed the procedure including the risks, benefits and alternatives for the proposed anesthesia with the patient or authorized representative who has indicated his/her understanding and acceptance.     Plan Discussed with: CRNA and Anesthesiologist  Anesthesia Plan Comments:        Anesthesia Quick Evaluation

## 2014-10-27 NOTE — Interval H&P Note (Signed)
History and Physical Interval Note:  10/27/2014 3:02 PM  Scot Jun  has presented today for surgery, with the diagnosis of Chronic diarrhea and rectal bleeding  The various methods of treatment have been discussed with the patient and family. After consideration of risks, benefits and other options for treatment, the patient has consented to  Procedure(s): COLONOSCOPY (N/A) as a surgical intervention .  The patient's history has been reviewed, patient examined, no change in status, stable for surgery.  I have reviewed the patient's chart and labs.  Questions were answered to the patient's satisfaction.      Cohick M  Assessment:  1.  Blood in stool. 2.  Diarrhea. 3.  Anemia.  Plan:  1.  Colonoscopy. 2.  Risks (bleeding, infection, bowel perforation that could require surgery, sedation-related changes in cardiopulmonary systems), benefits (identification and possible treatment of source of symptoms, exclusion of certain causes of symptoms), and alternatives (watchful waiting, radiographic imaging studies, empiric medical treatment) of colonoscopy were explained to patient/family in detail and patient wishes to proceed.

## 2014-10-27 NOTE — Op Note (Signed)
[name] [facility] [address] [place], [address]   COLONOSCOPY PROCEDURE REPORT  PATIENT: Becky, [name]  MR#: [id number] BIRTHDATE: 1946-03-04 , 68  yrs. old GENDER: female ENDOSCOPIST: Willis Modena, MD REFERRED [id number] PROCEDURE DATE:  11/15/14 PROCEDURE:   Colonoscopy with biopsies ASA CLASS:   Class IV INDICATIONS:diarrhea, blood in stool. MEDICATIONS: Monitored anesthesia care  DESCRIPTION OF PROCEDURE:   After the risks benefits and alternatives of the procedure were thoroughly explained, informed consent was obtained.  revealed no abnormalities of the rectum. The pediatric colonoscope was introduced through the anus and advanced to the cecum, which was identified by both the appendix and ileocecal valve. No adverse events experienced.   The quality of the prep was good.  The instrument was then slowly withdrawn as the colon was fully examined.    Findings:  Moderately diminished anal sphincter tone, mild external hemorrhoids, otherwise normal digital rectal exam.  Prep quality good.  Few sigmoid diverticula noted.  No polyps, masses, vascular ectasias, or inflammatory changes were seen. Random biopsies of normal-appearing colonic mucosa were taken with cold biopsy forceps to evaluate for microscopic colitis.   No old or fresh blood was seen to the extent of the examination.  Normal retroflexed view of rectum.            Withdrawal time was about 10 minutes     .  The scope was withdrawn and the procedure completed.  COMPLICATIONS:  ENDOSCOPIC IMPRESSION:     As above.  Blood in stool likely hemorrhoidal.  No explanation for diarrhea.  No findings of ischemic colitis identified.  RECOMMENDATIONS:     1.  Watch for potential complications of procedure. 2.  Await GI pathogen stool panel. 3.  Await colon biopsy results. 4.  Advance diet as tolerated. 5.  Would likely not repeat routine colonoscopy, in absence  of new/interval symptoms, in light of patient's multiple comorbidities. 6.  Anusol-HC suppositories, as needed, if patient has recurrent bleeding. 7.  Ok to resume clopidigrel, if clinically warranted.  eSigned:  Willis Modena, MD 11-15-14 3:58 PM   cc:  CPT CODES: ICD CODES:  The ICD and CPT codes recommended by this software are interpretations from the data that the clinical staff has captured with the software.  The verification of the translation of this report to the ICD and CPT codes and modifiers is the sole responsibility of the health care institution and practicing physician where this report was generated.  [employer] will not be held responsible for the validity of the ICD and CPT codes included on this report.  AMA assumes no liability for data contained or not contained herein. CPT is a Publishing rights manager of the [employer].

## 2014-10-27 NOTE — Progress Notes (Signed)
Patient's BG  At 0630 was 55 and she is NPO. D50  is ordered and called pharmacy to verify. [name], [place]  10/27/2014  6:38 AM

## 2014-10-27 NOTE — Progress Notes (Signed)
[facility]                                                                              Patient Demographics  [name], is a 69 y.o. female, DOB - [birth year], ZOX:[id number]  Admit date - 10/22/2014   Admitting Physician Edsel Petrin, DO  Outpatient Primary MD for the patient is Cala Bradford, MD  LOS - 5   Chief Complaint  Patient presents with  . GI Bleeding      HPI on 10/22/2014  [name] is a 69 y.o. female with a history of CHF with an EF of 25%, diabetes mellitus, hypertension, chronic respiratory failure requiring trach presented to the emergency department with complaints of diarrhea and bloody stools which started early this morning. Patient stated she had approximate 5-6 episodes before coming to the hospital. She noted that she had dark red blood in her stool which then became bright red blood. Patient did have some pass some clots. She denies taking any anti-inflammatories. Does state that she takes daily aspirin. Patient states her last colonoscopy was approximately 8 years ago which was done in [place]. At this time she denies any abdominal pain, nausea or vomiting. Patient does admit to having some bowel incontinence. She denies any chest pain or shortness of breath. Patient was noted to have several bloody bowel movements in the emergency department. Gastroenterology was called. [facility] was asked to admit.   Assessment & Plan   Lower GI Bleeding/Hematochezia -Patient currently hemodynamically stable -Bleeding scan was negative -Received  2uPRBCs during this admission, Hb currently stable at 9.3 -Gastroenterology consulted and appreciated -Patient is scheduled for colonoscopy today -Placed on clear liquid diet  -Patient states she is no longer having bloody BM, but still having diarrhea  Diarrhea -Likely secondary to patient's GI bleeding -C. difficile PCR negative -GI pathogen panel pending  -Unlikely infectious as patient is afebrile with  no leukocytosis  Acute blood loss Anemia/Symptomatic anemia  -Secondary to GI bleeding -Patient's hemoglobin appears to be around 11 baseline -Treatment plan as above -Patient does complain of dizziness with standing  Chronic combined systolic and diastolic heart failure -Patient currently euvolemic -Echocardiogram in January 2014 showed an EF of 25-30%, grade 2 diastolic dysfunction -Will monitor daily weights, intake and output -Continue Lasix, coreg, avapro  Coronary artery disease -Currently stable, patient chest pain free -Will hold plavix and aspirin at this due to GI bleed  Hypertension -Continue Avapro, Coreg with holding parameters  Chronic respiratory failure/trach -Stable  Hyperlipidemia -Continue statin  Diabetes mellitus, type II -Continue insulin sliding scale was CBG monitoring -Will hold patient's home regimen as she will be placed on a clear liquid diet  Chronic kidney disease, stage III -Creatinine appears to be at baseline we'll continue to monitor BMP  Depression -Continue Cymbalta  Code Status: Full  Family Communication: None at bedside  Disposition Plan: Admitted, pending colonoscopy today  Time Spent in minutes   30 minutes  Procedures  Colonoscopy  Consults   Gastroenterology, [facility]  DVT Prophylaxis  SCDs  Lab Results  Component Value Date   PLT 159 10/25/2014    Medications  Scheduled Meds: . [MAR Hold] carvedilol  25 mg  Oral BID WC  . dextrose      . [MAR Hold] DULoxetine  60 mg Oral Daily  . [MAR Hold] furosemide  40 mg Oral BID  . [MAR Hold] gabapentin  300 mg Oral BID  . [MAR Hold] insulin aspart  0-9 Units Subcutaneous TID WC  . [MAR Hold] insulin glargine  16 Units Subcutaneous QHS  . [MAR Hold] insulin glargine  8 Units Subcutaneous Once  . [MAR Hold] irbesartan  300 mg Oral QHS  . [MAR Hold] pantoprazole  40 mg Oral Daily  . [MAR Hold] simvastatin  40 mg Oral QPM  . [MAR Hold] sodium chloride  3 mL Intravenous  Q12H  . [MAR Hold] sodium chloride  3 mL Intravenous Q12H   Continuous Infusions: . sodium chloride 15 mL/hr at 10/27/14 0004   PRN Meds:.[MAR Hold] sodium chloride, [MAR Hold] acetaminophen **OR** [MAR Hold] acetaminophen, [MAR Hold] ipratropium-albuterol, [MAR Hold] nitroGLYCERIN, [MAR Hold] ondansetron **OR** [MAR Hold] ondansetron (ZOFRAN) IV, [MAR Hold] sodium chloride  Antibiotics    Anti-infectives    None        Subjective:   [name] seen and examined today.  Patient states she is no longer feeling dizzy.  Her diarrhea had slowed down until she had to take the prep for her colonoscopy. Denies chest pain, SOB, abdominal pain.  Objective:   Filed Vitals:   10/27/14 0617 10/27/14 0931 10/27/14 1020 10/27/14 1333  BP: 119/67  145/52 176/60  Pulse: 50  64   Temp: 98.3 F (36.8 C)  98.7 F (37.1 C) 98.8 F (37.1 C)  TempSrc: Oral  Oral Oral  Resp: Height:      Weight:      SpO2: 95% 93% 94% 100%    Wt Readings from Last 3 Encounters:  10/27/14 78.518 kg (173 lb 1.6 oz)  07/18/14 77.111 kg (170 lb)  07/17/14 78.926 kg (174 lb)     Intake/Output Summary (Last 24 hours) at 10/27/14 1417 Last data filed at 10/26/14 2200  Gross per 24 hour  Intake      3 ml  Output      0 ml  Net      3 ml    Exam  General: Well developed, well nourished, NAD, appears stated age  HEENT: NCAT,  mucous membranes moist.  Trach  Cardiovascular: S1 S2 auscultated, no rubs, murmurs or gallops. Regular rate and rhythm.  Respiratory: Clear to auscultation bilaterally with equal chest rise  Abdomen: Soft, obese, nontender, nondistended, + bowel sounds  Extremities: warm dry without cyanosis clubbing or edema  Neuro: AAOx3, no focal deficits  Psych: Appropriate mood and affect  Data Review   Micro Results Recent Results (from the past 240 hour(s))  Clostridium Difficile by PCR     Status: None   Collection Time: 10/23/14  1:37 PM  Result Value Ref  Range Status   C difficile by pcr NEGATIVE NEGATIVE Final    Radiology Reports Nm Gi Blood Loss  10/22/2014   CLINICAL DATA:  Gastrointestinal hemorrhage, unspecified gastritis.  EXAM: NUCLEAR MEDICINE GASTROINTESTINAL BLEEDING SCAN  TECHNIQUE: Sequential abdominal images were obtained following intravenous administration of Tc-40m labeled red blood cells.  RADIOPHARMACEUTICALS:  25 mCi Tc-34m in-vitro labeled red cells.  COMPARISON:  None.  FINDINGS: Normal biodistribution of the radiotracer. Linear activity in the pelvis is present on the initial image and does not increase or travel typical of GI hemorrhage. No active GI hemorrhage suspected.  IMPRESSION: Negative.  The source of GI hemorrhage is not identified.   Electronically Signed   By: Tiburcio Pea M.D.   On: 10/22/2014 21:31    CBC  Recent Labs Lab 10/22/14 1602  10/23/14 0545  10/24/14 0625  10/24/14 2230 10/25/14 0524 10/25/14 1415 10/26/14 0401 10/27/14 0725  WBC 6.2  --  5.7  --  4.7  --   --  5.8  --   --   --   HGB 7.6*  < > 7.9*  < > 7.7*  < > 9.1* 9.4* 9.1* 8.8* 9.3*  HCT 23.6*  < > 24.2*  < > 23.8*  < > 27.7* 29.3* 27.9* 28.5* 29.9*  PLT 155  --  133*  --  138*  --   --  159  --   --   --   MCV 91.5  --  89.0  --  90.2  --   --  89.9  --   --   --   MCH 29.5  --  29.0  --  29.2  --   --  28.8  --   --   --   MCHC 32.2  --  32.6  --  32.4  --   --  32.1  --   --   --   RDW 16.2*  --  17.7*  --  17.5*  --   --  17.3*  --   --   --   < > = values in this interval not displayed.  Chemistries   Recent Labs Lab 10/22/14 1602 10/23/14 0545 10/24/14 0625 10/25/14 0524  NA 141 144 142 142  K 4.2 3.7 3.5 3.6  CL 108 109 110 109  CO2 GLUCOSE 227* 75 81 115*  BUN 27* 30* 20 13  CREATININE 1.30* 1.28* 1.14* 1.14*  CALCIUM 8.4 8.3* 8.1* 7.9*  AST 18  --   --   --   ALT 14  --   --   --   ALKPHOS 74  --   --   --   BILITOT 0.7  --   --   --     ------------------------------------------------------------------------------------------------------------------ estimated creatinine clearance is 45.9 mL/min (by C-G formula based on Cr of 1.14). ------------------------------------------------------------------------------------------------------------------ No results for input(s): HGBA1C in the last 72 hours. ------------------------------------------------------------------------------------------------------------------ No results for input(s): CHOL, HDL, LDLCALC, TRIG, CHOLHDL, LDLDIRECT in the last 72 hours. ------------------------------------------------------------------------------------------------------------------ No results for input(s): TSH, T4TOTAL, T3FREE, THYROIDAB in the last 72 hours.  Invalid input(s): FREET3 ------------------------------------------------------------------------------------------------------------------ No results for input(s): VITAMINB12, FOLATE, FERRITIN, TIBC, IRON, RETICCTPCT in the last 72 hours.  Coagulation profile  Recent Labs Lab 10/22/14 1602  INR 1.35    No results for input(s): DDIMER in the last 72 hours.  Cardiac Enzymes No results for input(s): CKMB, TROPONINI, MYOGLOBIN in the last 168 hours.  Invalid input(s): CK ------------------------------------------------------------------------------------------------------------------ Invalid input(s): POCBNP    Haldon Carley D.O. on 10/27/2014 at 2:17 PM  Between 7am to 7pm - Pager - [phone]  After 7pm go to [email or web address] - password [email or web address]  And look for the night coverage person covering for me after hours  [facility] Office  [phone]

## 2014-10-27 NOTE — Progress Notes (Signed)
Pt. unavailable for 1200 p.m. Trach check having procedure off floor, RT to monitor.

## 2014-10-27 NOTE — H&P (View-Only) (Signed)
[facility] Progress Note  Subjective: Patient continues to have nonbloody diarrhea no more blood, still on clear liquid diet  Objective: Vital signs in last 24 hours: Temp:  [97.6 F (36.4 C)-98.3 F (36.8 C)] 97.9 F (36.6 C) (01/03 1415) Pulse Rate:  [55-61] 61 (01/03 1415) Resp:  [18-20] 20 (01/03 1415) BP: (102-133)/(40-58) 133/47 mmHg (01/03 1415) SpO2:  [94 %-100 %] 94 % (01/03 1415) FiO2 (%):  [28 %] 28 % (01/02 2157) Weight:  [81.149 kg (178 lb 14.4 oz)] 81.149 kg (178 lb 14.4 oz) (01/03 0500) Weight change:    PE: Unchanged  Lab Results: Results for orders placed or performed during the hospital encounter of 10/22/14 (from the past 24 hour(s))  Glucose, capillary     Status: Abnormal   Collection Time: 10/25/14  5:05 PM  Result Value Ref Range   Glucose-Capillary 66 (L) 70 - 99 mg/dL   Comment 1 Notify RN   Glucose, capillary     Status: Abnormal   Collection Time: 10/25/14  5:44 PM  Result Value Ref Range   Glucose-Capillary 136 (H) 70 - 99 mg/dL   Comment 1 Notify RN   Glucose, capillary     Status: None   Collection Time: 10/25/14  9:56 PM  Result Value Ref Range   Glucose-Capillary 85 70 - 99 mg/dL   Comment 1 Documented in Chart    Comment 2 Notify RN   Hemoglobin and hematocrit, blood     Status: Abnormal   Collection Time: 10/26/14  4:01 AM  Result Value Ref Range   Hemoglobin 8.8 (L) 12.0 - 15.0 g/dL   HCT 28.5 (L) 36.0 - 46.0 %    Studies/Results: No results found.    Assessment: Chronic diarrhea with previous rectal bleeding GI pathogen panel still pending. Last colonoscopy estimated to be greater than 10 years ago.  Plan: We will go ahead and plan colonoscopy tomorrow    Tressy Kunzman C 10/26/2014, 2:49 PM   

## 2014-10-27 NOTE — Transfer of Care (Signed)
Immediate Anesthesia Transfer of Care Note  Patient: [name]  Procedure(s) Performed: Procedure(s): COLONOSCOPY (N/A)  Patient Location: PACU endoscopy  Anesthesia Type:General  Level of Consciousness: awake, alert , oriented and patient cooperative  Airway & Oxygen Therapy: Patient Spontanous Breathing and Patient connected to tracheostomy mask oxygen  Post-op Assessment: Report given to PACU RN, Post -op Vital signs reviewed and stable and Patient moving all extremities  Post vital signs: Reviewed and stable  Complications: No apparent anesthesia complications

## 2014-10-27 NOTE — Progress Notes (Signed)
Placed ATC/Oxygen device in room @ bedside if needed during procedure today, RT to monitor/Pt./RN made aware.

## 2014-10-28 ENCOUNTER — Encounter (HOSPITAL_COMMUNITY): Payer: Self-pay | Admitting: Gastroenterology

## 2014-10-28 LAB — GLUCOSE, CAPILLARY
GLUCOSE-CAPILLARY: 110 mg/dL — AB (ref 70–99)
GLUCOSE-CAPILLARY: 154 mg/dL — AB (ref 70–99)
GLUCOSE-CAPILLARY: 192 mg/dL — AB (ref 70–99)
Glucose-Capillary: 117 mg/dL — ABNORMAL HIGH (ref 70–99)

## 2014-10-28 LAB — GI PATHOGEN PANEL BY PCR, STOOL
C DIFFICILE TOXIN A/B: NEGATIVE
Campylobacter by PCR: NEGATIVE
Cryptosporidium by PCR: NEGATIVE
E coli (ETEC) LT/ST: NEGATIVE
E coli (STEC): NEGATIVE
E coli 0157 by PCR: NEGATIVE
G LAMBLIA BY PCR: NEGATIVE
Norovirus GI/GII: NEGATIVE
ROTAVIRUS A BY PCR: NEGATIVE
SALMONELLA BY PCR: NEGATIVE
Shigella by PCR: NEGATIVE

## 2014-10-28 LAB — HEMOGLOBIN AND HEMATOCRIT, BLOOD
HEMATOCRIT: 29 % — AB (ref 36.0–46.0)
Hemoglobin: 9.4 g/dL — ABNORMAL LOW (ref 12.0–15.0)

## 2014-10-28 LAB — CLOSTRIDIUM DIFFICILE BY PCR: Toxigenic C. Difficile by PCR: NEGATIVE

## 2014-10-28 NOTE — Progress Notes (Signed)
Pt had a medium size stool that was brown / yellow and was loose and watery.  Will continue to monitor.

## 2014-10-28 NOTE — Progress Notes (Signed)
[facility] Progress Note  Subjective: Patient still complaining of nonbloody diarrhea but not a lot of solid food intake since her colonoscopy yesterday.  Objective: Vital signs in last 24 hours: Temp:  [97.5 F (36.4 C)-98.8 F (37.1 C)] 97.5 F (36.4 C) (01/05 0947) Pulse Rate:  [44-72] 65 (01/05 0947) Resp:  [16-22] 18 (01/05 0947) BP: (119-176)/(36-61) 135/43 mmHg (01/05 0947) SpO2:  [93 %-100 %] 96 % (01/05 0947) FiO2 (%):  [28 %] 28 % (01/05 0349) Weight:  [81.33 kg (179 lb 4.8 oz)] 81.33 kg (179 lb 4.8 oz) (01/05 0534) Weight change: 2.812 kg (6 lb 3.2 oz)   PE: Unchanged  Lab Results: Results for orders placed or performed during the hospital encounter of 10/22/14 (from the past 24 hour(s))  Glucose, capillary     Status: Abnormal   Collection Time: 10/27/14 12:27 PM  Result Value Ref Range   Glucose-Capillary 57 (L) 70 - 99 mg/dL  Glucose, capillary     Status: Abnormal   Collection Time: 10/27/14  1:33 PM  Result Value Ref Range   Glucose-Capillary 107 (H) 70 - 99 mg/dL  Glucose, capillary     Status: None   Collection Time: 10/27/14  4:23 PM  Result Value Ref Range   Glucose-Capillary 83 70 - 99 mg/dL  Glucose, capillary     Status: Abnormal   Collection Time: 10/27/14  9:58 PM  Result Value Ref Range   Glucose-Capillary 195 (H) 70 - 99 mg/dL   Comment 1 Notify RN    Comment 2 Documented in Chart   Hemoglobin and hematocrit, blood     Status: Abnormal   Collection Time: 10/28/14  3:50 AM  Result Value Ref Range   Hemoglobin 9.4 (L) 12.0 - 15.0 g/dL   HCT 13.229.0 (L) 44.036.0 - 10.246.0 %  Glucose, capillary     Status: Abnormal   Collection Time: 10/28/14  6:35 AM  Result Value Ref Range   Glucose-Capillary 110 (H) 70 - 99 mg/dL   Comment 1 Notify RN    Comment 2 Documented in Chart     Studies/Results: No results found.    Assessment: Watery diarrhea etiology unclear with previous bleeding no obvious source on colonoscopy. Awaiting biopsies to  rule out microscopic colitis.  Plan: GI pathogens panel never came back. We will reorder as well as C. difficile.Marland Kitchen. Await biopsy results and if negative for microscopic colitis consider empiric course of Flagyl. We'll also screen for celiac disease.    Dhaval Woo C 10/28/2014, 11:51 AM

## 2014-10-28 NOTE — Progress Notes (Signed)
Chaplain follow up with pt. Pt explained that her recent colonoscopy revealed that her issue was not cancer, but she does not know what is causing her issues. Chaplain offered prayer and emotional support. Pt reported that she "felt better" after visit with chaplain. Chaplain will continue to follow.   10/28/14 1500  Clinical Encounter Type  Visited With Patient  Visit Type Follow-up;Spiritual support  Referral From Nurse  Spiritual Encounters  Spiritual Needs Emotional;Prayer  Stress Factors  Patient Stress Factors Health changes  Shekela Goodridge, Mayer MaskerCourtney F, Chaplain 10/28/2014 3:02 PM

## 2014-10-28 NOTE — Progress Notes (Signed)
[facility]                                                                              Patient Demographics  [name], is a 69 y.o. female, DOB - 09/13/1946, ZOX:[id number]  Admit date - 10/22/2014   Admitting Physician Edsel Petrin, DO  Outpatient Primary MD for the patient is Cala Bradford, MD  LOS - 6   Chief Complaint  Patient presents with  . GI Bleeding      HPI on 10/22/2014  [name] is a 69 y.o. female with a history of CHF with an EF of 25%, diabetes mellitus, hypertension, chronic respiratory failure requiring trach presented to the emergency department with complaints of diarrhea and bloody stools which started early this morning. Patient stated she had approximate 5-6 episodes before coming to the hospital. She noted that she had dark red blood in her stool which then became bright red blood. Patient did have some pass some clots. She denies taking any anti-inflammatories. Does state that she takes daily aspirin. Patient states her last colonoscopy was approximately 8 years ago which was done in [place]. At this time she denies any abdominal pain, nausea or vomiting. Patient does admit to having some bowel incontinence. She denies any chest pain or shortness of breath. Patient was noted to have several bloody bowel movements in the emergency department. Gastroenterology was called. [facility] was asked to admit.   Interim history Patient did have colonoscopy on 10/27/2014.  She had like to wait for biopsy results as well as GI pathogen panel. She continues to have diarrhea however unknown etiology as C. difficile was negative.  Assessment & Plan   Lower GI Bleeding/Hematochezia -Patient currently hemodynamically stable -Bleeding scan was negative -Received  2uPRBCs during this admission, Hb currently stable at 9.4 -Gastroenterology consulted and appreciated -Patient is scheduled for colonoscopy 10/27/14 -Placed on full liquid diet -Patient states she  is no longer having bloody BM, but still having diarrhea -GI would like to wait for biopsy results as well as pathogen panel  Diarrhea -Likely secondary to patient's GI bleeding -C. difficile PCR negative -GI pathogen panel pending  -Unlikely infectious as patient is afebrile with no leukocytosis  Acute blood loss Anemia/Symptomatic anemia  -Secondary to GI bleeding -Patient's hemoglobin appears to be around 11 baseline -Treatment plan as above -Patient does complain of dizziness with standing  Chronic combined systolic and diastolic heart failure -Patient currently euvolemic -Echocardiogram in January 2014 showed an EF of 25-30%, grade 2 diastolic dysfunction -Will monitor daily weights, intake and output -Continue Lasix, coreg, avapro  Coronary artery disease -Currently stable, patient chest pain free -Restarted Plavix  Hypertension -Continue Avapro, Coreg with holding parameters  Chronic respiratory failure/trach -Stable  Hyperlipidemia -Continue statin  Diabetes mellitus, type II -Continue insulin sliding scale was CBG monitoring -Will hold patient's home regimen as she will be placed on a clear liquid diet  Chronic kidney disease, stage III -Creatinine appears to be at baseline we'll continue to monitor BMP  Depression -Continue Cymbalta  Code Status: Full  Family Communication: None at bedside  Disposition Plan: Admitted  Time Spent in minutes   30 minutes  Procedures  Colonoscopy  Consults   Gastroenterology, [facility]  DVT Prophylaxis  SCDs  Lab Results  Component Value Date   PLT 159 10/25/2014    Medications  Scheduled Meds: . carvedilol  25 mg Oral BID WC  . clopidogrel  75 mg Oral Daily  . DULoxetine  60 mg Oral Daily  . furosemide  40 mg Oral BID  . gabapentin  300 mg Oral BID  . insulin aspart  0-9 Units Subcutaneous TID WC  . insulin glargine  16 Units Subcutaneous QHS  . insulin glargine  8 Units Subcutaneous Once  . irbesartan   300 mg Oral QHS  . pantoprazole  40 mg Oral Daily  . simvastatin  40 mg Oral QPM  . sodium chloride  3 mL Intravenous Q12H  . sodium chloride  3 mL Intravenous Q12H   Continuous Infusions: . sodium chloride 15 mL/hr at 10/27/14 0004   PRN Meds:.sodium chloride, acetaminophen **OR** acetaminophen, fentaNYL, ipratropium-albuterol, meperidine (DEMEROL) injection, nitroGLYCERIN, ondansetron **OR** ondansetron (ZOFRAN) IV, promethazine, sodium chloride  Antibiotics    Anti-infectives    None        Subjective:   [name] seen and examined today.  Patient continues to have diarrhea however no abdominal pain. Patient denies any blood in her diarrhea. Patient denies chest pain, shortness of breath, dizziness.  Objective:   Filed Vitals:   10/28/14 0349 10/28/14 0534 10/28/14 0730 10/28/14 0947  BP:  140/46  135/43  Pulse: 66 60 62 65  Temp:  97.9 F (36.6 C)  97.5 F (36.4 C)  TempSrc:  Oral  Oral  Resp: Height:      Weight:  81.33 kg (179 lb 4.8 oz)    SpO2: 93% 100% 96% 96%    Wt Readings from Last 3 Encounters:  10/28/14 81.33 kg (179 lb 4.8 oz)  07/18/14 77.111 kg (170 lb)  07/17/14 78.926 kg (174 lb)    No intake or output data in the 24 hours ending 10/28/14 1203  Exam  General: Well developed, well nourished, NAD, appears stated age  HEENT: NCAT,  mucous membranes moist.  Trach  Cardiovascular: S1 S2 auscultated, no rubs, murmurs or gallops. Regular rate and rhythm.  Respiratory: Clear to auscultation bilaterally with equal chest rise  Abdomen: Soft, obese, nontender, nondistended, + bowel sounds  Extremities: warm dry without cyanosis clubbing or edema  Neuro: AAOx3, no focal deficits  Psych: Appropriate mood and affect  Data Review   Micro Results Recent Results (from the past 240 hour(s))  Clostridium Difficile by PCR     Status: None   Collection Time: 10/23/14  1:37 PM  Result Value Ref Range Status   C difficile by pcr  NEGATIVE NEGATIVE Final    Radiology Reports Nm Gi Blood Loss  10/22/2014   CLINICAL DATA:  Gastrointestinal hemorrhage, unspecified gastritis.  EXAM: NUCLEAR MEDICINE GASTROINTESTINAL BLEEDING SCAN  TECHNIQUE: Sequential abdominal images were obtained following intravenous administration of Tc-20m labeled red blood cells.  RADIOPHARMACEUTICALS:  25 mCi Tc-25m in-vitro labeled red cells.  COMPARISON:  None.  FINDINGS: Normal biodistribution of the radiotracer. Linear activity in the pelvis is present on the initial image and does not increase or travel typical of GI hemorrhage. No active GI hemorrhage suspected.  IMPRESSION: Negative.    The source of GI hemorrhage is not identified.   Electronically Signed   By: Tiburcio Pea M.D.   On: 10/22/2014 21:31    CBC  Recent Labs Lab 10/22/14 1602  10/23/14  [id number]  10/24/14 0625  10/25/14 0524 10/25/14 1415 10/26/14 0401 10/27/14 0725 10/28/14 0350  WBC 6.2  --  5.7  --  4.7  --  5.8  --   --   --   --   HGB 7.6*  < > 7.9*  < > 7.7*  < > 9.4* 9.1* 8.8* 9.3* 9.4*  HCT 23.6*  < > 24.2*  < > 23.8*  < > 29.3* 27.9* 28.5* 29.9* 29.0*  PLT 155  --  133*  --  138*  --  159  --   --   --   --   MCV 91.5  --  89.0  --  90.2  --  89.9  --   --   --   --   MCH 29.5  --  29.0  --  29.2  --  28.8  --   --   --   --   MCHC 32.2  --  32.6  --  32.4  --  32.1  --   --   --   --   RDW 16.2*  --  17.7*  --  17.5*  --  17.3*  --   --   --   --   < > = values in this interval not displayed.  Chemistries   Recent Labs Lab 10/22/14 1602 10/23/14 0545 10/24/14 0625 10/25/14 0524  NA 141 144 142 142  K 4.2 3.7 3.5 3.6  CL 108 109 110 109  CO2 26 28 22 20   GLUCOSE 227* 75 81 115*  BUN 27* 30* 20 13  CREATININE 1.30* 1.28* 1.14* 1.14*  CALCIUM 8.4 8.3* 8.1* 7.9*  AST 18  --   --   --   ALT 14  --   --   --   ALKPHOS 74  --   --   --   BILITOT 0.7  --   --   --     ------------------------------------------------------------------------------------------------------------------ estimated creatinine clearance is 46.7 mL/min (by C-G formula based on Cr of 1.14). ------------------------------------------------------------------------------------------------------------------ No results for input(s): HGBA1C in the last 72 hours. ------------------------------------------------------------------------------------------------------------------ No results for input(s): CHOL, HDL, LDLCALC, TRIG, CHOLHDL, LDLDIRECT in the last 72 hours. ------------------------------------------------------------------------------------------------------------------ No results for input(s): TSH, T4TOTAL, T3FREE, THYROIDAB in the last 72 hours.  Invalid input(s): FREET3 ------------------------------------------------------------------------------------------------------------------ No results for input(s): VITAMINB12, FOLATE, FERRITIN, TIBC, IRON, RETICCTPCT in the last 72 hours.  Coagulation profile  Recent Labs Lab 10/22/14 1602  INR 1.35    No results for input(s): DDIMER in the last 72 hours.  Cardiac Enzymes No results for input(s): CKMB, TROPONINI, MYOGLOBIN in the last 168 hours.  Invalid input(s): CK ------------------------------------------------------------------------------------------------------------------ Invalid input(s): POCBNP    Travarius Lange D.O. on 10/28/2014 at 12:03 PM  Between 7am to 7pm - Pager - [phone]  After 7pm go to [email or web address] - password [email or web address]  And look for the night coverage person covering for me after hours  [facility] Office  [phone]

## 2014-10-29 LAB — CBC
HCT: 30.3 % — ABNORMAL LOW (ref 36.0–46.0)
HEMOGLOBIN: 9.5 g/dL — AB (ref 12.0–15.0)
MCH: 28.2 pg (ref 26.0–34.0)
MCHC: 31.4 g/dL (ref 30.0–36.0)
MCV: 89.9 fL (ref 78.0–100.0)
PLATELETS: 197 10*3/uL (ref 150–400)
RBC: 3.37 MIL/uL — AB (ref 3.87–5.11)
RDW: 16.5 % — ABNORMAL HIGH (ref 11.5–15.5)
WBC: 6.3 10*3/uL (ref 4.0–10.5)

## 2014-10-29 LAB — GLUCOSE, CAPILLARY
GLUCOSE-CAPILLARY: 144 mg/dL — AB (ref 70–99)
Glucose-Capillary: 90 mg/dL (ref 70–99)

## 2014-10-29 LAB — BASIC METABOLIC PANEL
Anion gap: 7 (ref 5–15)
BUN: 9 mg/dL (ref 6–23)
CALCIUM: 8.6 mg/dL (ref 8.4–10.5)
CHLORIDE: 109 meq/L (ref 96–112)
CO2: 26 mmol/L (ref 19–32)
Creatinine, Ser: 1.31 mg/dL — ABNORMAL HIGH (ref 0.50–1.10)
GFR calc Af Amer: 47 mL/min — ABNORMAL LOW (ref >90)
GFR calc non Af Amer: 41 mL/min — ABNORMAL LOW (ref >90)
Glucose, Bld: 94 mg/dL (ref 70–99)
Potassium: 4.1 mmol/L (ref 3.5–5.1)
Sodium: 142 mmol/L (ref 135–145)

## 2014-10-29 LAB — GI PATHOGEN PANEL BY PCR, STOOL
C difficile toxin A/B: NEGATIVE
Campylobacter by PCR: NEGATIVE
Cryptosporidium by PCR: NEGATIVE
E coli (ETEC) LT/ST: NEGATIVE
E coli (STEC): NEGATIVE
E coli 0157 by PCR: NEGATIVE
G lamblia by PCR: NEGATIVE
Norovirus GI/GII: NEGATIVE
Rotavirus A by PCR: NEGATIVE
SHIGELLA BY PCR: NEGATIVE
Salmonella by PCR: NEGATIVE

## 2014-10-29 LAB — TISSUE TRANSGLUTAMINASE, IGA: TISSUE TRANSGLUTAMINASE AB, IGA: 1 U/mL (ref <4)

## 2014-10-29 MED ORDER — LOPERAMIDE HCL 2 MG PO TABS: 2.0000 mg | ORAL_TABLET | Freq: Three times a day (TID) | ORAL | Status: DC | PRN

## 2014-10-29 MED ORDER — METRONIDAZOLE 500 MG PO TABS
500.0000 mg | ORAL_TABLET | Freq: Three times a day (TID) | ORAL | Status: DC
  Administered 2014-10-29: 500 mg via ORAL
  Filled 2014-10-29: qty 1

## 2014-10-29 MED ORDER — STARCH 51 % RE SUPP: 1.0000 | RECTAL | Status: DC | PRN

## 2014-10-29 MED ORDER — INSULIN GLARGINE 300 UNIT/ML ~~LOC~~ SOPN: 16.0000 [IU] | PEN_INJECTOR | Freq: Every day | SUBCUTANEOUS | Status: DC

## 2014-10-29 MED ORDER — FERROUS SULFATE 325 (65 FE) MG PO TABS: 325.0000 mg | ORAL_TABLET | Freq: Two times a day (BID) | ORAL | Status: DC

## 2014-10-29 MED ORDER — METRONIDAZOLE 500 MG PO TABS: 500.0000 mg | ORAL_TABLET | Freq: Three times a day (TID) | ORAL | Status: AC

## 2014-10-29 NOTE — Discharge Instructions (Signed)
Follow with Primary MD [name],CYNTHIA S, MD in 7 days   Get CBC, CMP, 2 view Chest X ray checked  by Primary MD next visit.   Follow with gastroenterology Dr. Randa EvensEdwards as an outpatient please make appointment within 2 weeks. Activity: As tolerated with Full fall precautions use walker/cane & assistance as needed   Disposition Home    Diet: Heart Healthy , with feeding assistance and aspiration precautions as needed.  For Heart failure patients - Check your Weight same time everyday, if you gain over 2 pounds, or you develop in leg swelling, experience more shortness of breath or chest pain, call your Primary MD immediately. Follow Cardiac Low Salt Diet and 1.8 lit/day fluid restriction.   On your next visit with your primary care physician please Get Medicines reviewed and adjusted.   Please request your Prim.MD to go over all Hospital Tests and Procedure/Radiological results at the follow up, please get all Hospital records sent to your Prim MD by signing hospital release before you go home.   If you experience worsening of your admission symptoms, develop shortness of breath, life threatening emergency, suicidal or homicidal thoughts you must seek medical attention immediately by calling 911 or calling your MD immediately  if symptoms less severe.  You Must read complete instructions/literature along with all the possible adverse reactions/side effects for all the Medicines you take and that have been prescribed to you. Take any new Medicines after you have completely understood and accpet all the possible adverse reactions/side effects.   Do not drive, operating heavy machinery, perform activities at heights, swimming or participation in water activities or provide baby sitting services if your were admitted for syncope or siezures until you have seen by Primary MD or a Neurologist and advised to do so again.  Do not drive when taking Pain medications.    Do not take more than  prescribed Pain, Sleep and Anxiety Medications  Special Instructions: If you have smoked or chewed Tobacco  in the last 2 yrs please stop smoking, stop any regular Alcohol  and or any Recreational drug use.  Wear Seat belts while driving.   Please note  You were cared for by a hospitalist during your hospital stay. If you have any questions about your discharge medications or the care you received while you were in the hospital after you are discharged, you can call the unit and asked to speak with the hospitalist on call if the hospitalist that took care of you is not available. Once you are discharged, your primary care physician will handle any further medical issues. Please note that NO REFILLS for any discharge medications will be authorized once you are discharged, as it is imperative that you return to your primary care physician (or establish a relationship with a primary care physician if you do not have one) for your aftercare needs so that they can reassess your need for medications and monitor your lab values.

## 2014-10-29 NOTE — Progress Notes (Signed)
RT requested Continuous Pulse Ox via unit sec at approximately 0755. RN aware.

## 2014-10-29 NOTE — Progress Notes (Signed)
Chaplain initiated follow up with pt. Pt excited about going home. Chaplain offered prayer at pt request.   10/29/14 1400  Clinical Encounter Type  Visited With Patient  Visit Type Follow-up;Spiritual support  Spiritual Encounters  Spiritual Needs Emotional;Prayer  Stress Factors  Patient Stress Factors Health changes  [name], Mayer MaskerCourtney F, Chaplain 10/29/2014 2:14 PM

## 2014-10-29 NOTE — Progress Notes (Signed)
Awaiting Continuous Pulse ox delivery and application-

## 2014-10-29 NOTE — Progress Notes (Signed)
D/C orders received, pt for D/C home today.  IV and telemetry D/C.  Rx and D/C instructions given with verbalized understanding.  Family at bedside to assist with D/C.  Staff brought pt downstairs via wheelchair.  

## 2014-10-29 NOTE — Discharge Summary (Addendum)
[name], 69 y.o., DOB 10/10/1946, MRN [id number]. Admission date: 10/22/2014 Discharge Date 10/29/2014 Primary MD [name],CYNTHIA S, MD Admitting Physician Edsel PetrinMaryann Mikhail, DO  Admission Diagnosis  GI bleeding [K92.2] Lower GI bleed [K92.2]  Discharge Diagnosis   Principal Problem:   Lower GI bleed Active Problems:   CAD (coronary artery disease)   Chronic systolic congestive heart failure, NYHA class 3/EF 20-25% (ECHO 10/2012)   Diabetes mellitus type 2, uncontrolled   Hypertension   Hyperlipidemia   COPD (chronic obstructive pulmonary disease)   History of tracheostomy   GI bleeding      Past Medical History  Diagnosis Date  . Diabetes mellitus     on Lantus 30 U   . CAD (coronary artery disease)     S/p CABG and stenting  . PAD (peripheral artery disease)   . Stroke     MRI 11/2011 with remote occipital lobe. MRA with moderate left focal vertebral artery stenosis  . CHF (congestive heart failure) 11/2011    Echo with EF 30-35%, global hypokinesis, and inferior akinesis  . Hyperlipidemia   . DDD (degenerative disc disease), lumbar   . CVA (cerebral vascular accident) 11/2010  . MI (myocardial infarction) 1997  . COPD (chronic obstructive pulmonary disease)   . Anemia   . Irregular heart beat   . Hypertension   . GERD (gastroesophageal reflux disease)   . History of IBS   . Hypothyroidism     Goiter  . Thyroid disease   . Carotid artery occlusion   . Chronic kidney disease     stage 3  . History of tracheostomy 08/26/2013    Dr. Jenne PaneBates    Past Surgical History  Procedure Laterality Date  . Ptca    . Thyroidectomy    . Coronary artery bypass graft      2 vessel  . Carotid endarterectomy  ~2008    Left   . Cholecystectomy    . Tracheostomy tube placement  01/02/2012  . Angioplasty  [id number]    Aortogram by Dr. Italyhad McKenzie [facility]  . Pr vein bypass graft,aorto-fem-pop      Right common femoral-AK popliteal BPG & Right Popliteal-posterior tibial   . Pr vein bypass graft,aorto-fem-pop      Left Fem-pop BPG  . Carpal tunnel release Right   . Femoral-tibial bypass graft Left 06/11/2013    Procedure: BYPASS GRAFT  LEFT FEMORAL- POSTERIOR TIBIAL ARTERY/ REDO;  Surgeon: Sherren Kernsharles E Fields, MD;  [facility];  Service: Vascular;  Laterality: Left;  . Thrombectomy femoral artery Left 06/11/2013    Procedure: THROMBECTOMY FEMORAL ARTERY;  Surgeon: Sherren Kernsharles E Fields, MD;  [facility];  Service: Vascular;  Laterality: Left;  . Spine surgery  Oct. 27, 2014    Injection - Back  . Abdominal aortagram N/A 04/05/2013    Procedure: ABDOMINAL Ronny FlurryAORTAGRAM;  Surgeon: Sherren Kernsharles E Fields, MD;  [facility];  Service: Cardiovascular;  Laterality: N/A;  . Colonoscopy N/A 10/27/2014    Procedure: COLONOSCOPY;  Surgeon: Willis ModenaWilliam Outlaw, MD;  [facility];  Service: Endoscopy;  Laterality: N/A;     Hospital Course See H&P, Labs, Consult and Test reports for all details in brief, patient was admitted for   Principal Problem:   Lower GI bleed Active Problems:   CAD (coronary artery disease)   Chronic systolic congestive heart failure, NYHA class 3/EF 20-25% (ECHO 10/2012)   Diabetes mellitus type 2, uncontrolled   Hypertension   Hyperlipidemia   COPD (chronic  obstructive pulmonary disease)   History of tracheostomy   GI bleeding   [name] is a 69 y.o. female with a history of CHF with an EF of 25%, diabetes mellitus, hypertension, chronic respiratory failure requiring trach presented to the emergency department with complaints of diarrhea and bloody stools which started early this morning. Patient stated she had approximate 5-6 episodes before coming to the hospital. She noted that she had dark red blood in her stool which then became bright red blood. Patient did have some pass some clots. She denies taking any anti-inflammatories. Does state that she takes daily aspirin. Patient states her last colonoscopy was approximately 8 years  ago which was done in [place]. At this time she denies any abdominal pain, nausea or vomiting. Patient does admit to having some bowel incontinence. She denies any chest pain or shortness of breath. Patient was noted to have several bloody bowel movements in the emergency department. Gastroenterology was called. TR[facility] was asked to admit.  Patient did require 2 units packed blood cell transfusion, on December 30, and January 1, hemoglobin remained stable after that, will is 9.5 at day of discharge,  Patient did have colonoscopy on 10/27/2014. colonoscopy only significant for external hemorrhoids, biopsies were done, which showing normal mucosa, GI panel was negative , C. difficile was negative as well .  Lower GI Bleeding/Hematochezia -Bleeding scan was negative -Received 2uPRBCs during this admission, Hb currently stable at 9.5 on day of discharge -Gastroenterology consulted and appreciated - colonoscopy 10/27/14 showing no evidence of bleed, only external hemorrhoids .   Diarrhea -Likely secondary to patient's GI bleeding -C. difficile PCR negative -GI pathogen panelnegative negative GI pathogen panel. - Follow on celiac workup as an outpatient with GI , agent instructed to schedule an appointment with Dr. Ramon Dredge in 10 days . - We'll discharge on 10 days of oral Flagyl as needed Imodium as discussed with Dr. Madilyn Fireman    Chronic combined systolic and diastolic heart failure -Echocardiogram in January 2014 showed an EF of 25-30%, grade 2 diastolic dysfunction -Will monitor daily weights, intake and output -Continue Lasix, coreg, avapro  Coronary artery disease -Currently stable, patient chest pain free -Restarted Plavix  Hypertension -Continue Avapro, Coreg with holding parameters  Chronic respiratory failure/trach -Stable  Hyperlipidemia -Continue statin  Diabetes mellitus,  -Discharge on patient on sliding scale, and Lantus 16 units at bedtime.  Chronic kidney disease, stage  III -Creatinine appears to be at baseline   Depression -Continue Cymbalta  Consults   Gastroenterology Dr. Randa Evens  Procedures Colonoscopy 10/27/14 by Dr. Dulce Sellar Significant Tests:  See full reports for all details   Nm Gi Blood Loss  10/22/2014   CLINICAL DATA:  Gastrointestinal hemorrhage, unspecified gastritis.  EXAM: NUCLEAR MEDICINE GASTROINTESTINAL BLEEDING SCAN  TECHNIQUE: Sequential abdominal images were obtained following intravenous administration of Tc-70m labeled red blood cells.  RADIOPHARMACEUTICALS:  25 mCi Tc-89m in-vitro labeled red cells.  COMPARISON:  None.  FINDINGS: Normal biodistribution of the radiotracer. Linear activity in the pelvis is present on the initial image and does not increase or travel typical of GI hemorrhage. No active GI hemorrhage suspected.  IMPRESSION: Negative.    The source of GI hemorrhage is not identified.   Electronically Signed   By: Tiburcio Pea M.D.   On: 10/22/2014 21:31     Today   Subjective:   Semaj Kham today has no headache,no chest abdominal pain,no new weakness tingling or numbness, feels much better wants to go home today.  Objective:   Blood pressure 127/56, pulse 66, temperature 97.7 F (36.5 C), temperature source Oral, resp. rate 18, height  (1.575 m), weight 81.33 kg (179 lb 4.8 oz), SpO2 100 %.  Intake/Output Summary (Last 24 hours) at 10/29/14 1508 Last data filed at 10/28/14 2156  Gross per 24 hour  Intake      3 ml  Output      0 ml  Net      3 ml    Exam Awake Alert, Oriented *3, No new F.N deficits, Normal affect [place].AT,PERRAL Supple Neck,No JVD, No cervical lymphadenopathy appriciated.  tracheostomy with speaking valve . Symmetrical Chest wall movement, Good air movement bilaterally, CTAB RRR,No Gallops,Rubs or new Murmurs, No Parasternal Heave +ve B.Sounds, Abd Soft, Non tender, No organomegaly appriciated, No rebound -guarding or rigidity. No Cyanosis, Clubbing or edema, No new Rash or  bruise  Data Review   Cultures -   CBC w Diff:  Lab Results  Component Value Date   WBC 6.3 10/29/2014   HGB 9.5* 10/29/2014   HCT 30.3* 10/29/2014   PLT 197 10/29/2014   LYMPHOPCT 12 08/11/2013   MONOPCT 9 08/11/2013   EOSPCT 5 08/11/2013   BASOPCT 0 08/11/2013   CMP:  Lab Results  Component Value Date   NA 142 10/29/2014   K 4.1 10/29/2014   CL 109 10/29/2014   CO2 26 10/29/2014   BUN 9 10/29/2014   CREATININE 1.31* 10/29/2014   CREATININE 0.89 02/18/2012   PROT 5.7* 10/22/2014   ALBUMIN 3.1* 10/22/2014   BILITOT 0.7 10/22/2014   ALKPHOS 74 10/22/2014   AST 18 10/22/2014   ALT 14 10/22/2014  .  Micro Results Recent Results (from the past 240 hour(s))  Clostridium Difficile by PCR     Status: None   Collection Time: 10/23/14  1:37 PM  Result Value Ref Range Status   C difficile by pcr NEGATIVE NEGATIVE Final  Clostridium Difficile by PCR     Status: None   Collection Time: 10/28/14  3:30 PM  Result Value Ref Range Status   C difficile by pcr NEGATIVE NEGATIVE Final     Discharge Instructions          Follow-up Information    Follow up with WHITE,CYNTHIA S, MD. Schedule an appointment as soon as possible for a visit in 1 week.   Specialty:  Family Medicine   Contact information:   [address] [place] [address] [phone]       Follow up with EDWARDS JR,JAMES L, MD. Schedule an appointment as soon as possible for a visit in 10 days.   Specialty:  Gastroenterology   Contact information:   [address]                          [place] [address] [phone]       Discharge Medications     Medication List    STOP taking these medications        aspirin EC 81 MG tablet      TAKE these medications        carvedilol 25 MG tablet  Commonly known as:  COREG  Take 25 mg by mouth 2 (two) times daily with a meal.     clopidogrel 75 MG tablet  Commonly known as:  PLAVIX  Take 75 mg by mouth daily.      [employer] GLUCOSE 4 GM chewable  tablet  Generic drug:  glucose  Chew 1 tablet by mouth as needed for low blood sugar.     DULoxetine 60 MG capsule  Commonly known as:  CYMBALTA  Take 60 mg by mouth daily.     ferrous sulfate 325 (65 FE) MG tablet  Take 1 tablet (325 mg total) by mouth 2 (two) times daily with a meal.     furosemide 40 MG tablet  Commonly known as:  LASIX  Take 1 tablet (40 mg total) by mouth 2 (two) times daily.     furosemide 80 MG tablet  Commonly known as:  LASIX  Take 1 tablet (80 mg total) by mouth 2 (two) times daily as needed (take 1 tablet twice a day for 3 days - then prn).     gabapentin 300 MG capsule  Commonly known as:  NEURONTIN  Take 1 capsule (300 mg total) by mouth 2 (two) times daily.     Insulin Glargine 300 UNIT/ML Sopn  Commonly known as:  TOUJEO SOLOSTAR  Inject 16 Units into the skin at bedtime.     ipratropium-albuterol 0.5-2.5 (3) MG/3ML Soln  Commonly known as:  DUONEB  Take 3 mLs by nebulization 2 (two) times daily as needed (for shortnes of breath).     irbesartan 300 MG tablet  Commonly known as:  AVAPRO  Take 1 tablet (300 mg total) by mouth at bedtime.     lansoprazole 30 MG capsule  Commonly known as:  PREVACID  Take 30 mg by mouth 2 (two) times daily.     loperamide 2 MG tablet  Commonly known as:  IMODIUM A-D  Take 1 tablet (2 mg total) by mouth 3 (three) times daily as needed for diarrhea or loose stools.     metroNIDAZOLE 500 MG tablet  Commonly known as:  FLAGYL  Take 1 tablet (500 mg total) by mouth 3 (three) times daily.     nitroGLYCERIN 0.4 MG SL tablet  Commonly known as:  NITROSTAT  Place 0.4 mg under the tongue every 5 (five) minutes as needed. For chest pain     NOVOLOG FLEXPEN 100 UNIT/ML FlexPen  Generic drug:  insulin aspart  Inject 1-12 Units into the skin 3 (three) times daily with meals. As needed per sliding scale     OXYGEN  Inhale 4 L into the lungs at bedtime.     simvastatin 40 MG tablet   Commonly known as:  ZOCOR  Take 40 mg by mouth every evening.     starch 51 % suppository  Commonly known as:  ANUSOL  Place 1 suppository rectally as needed for pain.     traMADol 50 MG tablet  Commonly known as:  ULTRAM  Take 1 tablet (50 mg total) by mouth every 6 (six) hours as needed.     WOMENS 50+ MULTI VITAMIN/MIN Tabs  Take 1 tablet by mouth daily.         Total Time in preparing paper work, data evaluation and todays exam - 35 minutes  Sachit Gilman M.D on 10/29/2014 at 3:08 PM  [facility] Office  [phone]

## 2014-10-29 NOTE — Progress Notes (Signed)
Colon path, C diff, GI pathogens panel neg. Will try an empiric course of flagyl 500 TID while awaiting celiac screen

## 2014-11-01 LAB — STOOL CULTURE

## 2014-11-08 NOTE — Anesthesia Postprocedure Evaluation (Signed)
  Anesthesia Post-op Note  Patient: [name]  Procedure(s) Performed: Procedure(s): COLONOSCOPY (N/A)  Patient Location: PACU and Endoscopy Unit  Anesthesia Type:MAC  Level of Consciousness: awake  Airway and Oxygen Therapy: Patient Spontanous Breathing  Post-op Pain: mild  Post-op Assessment: Post-op Vital signs reviewed  Post-op Vital Signs: Reviewed  Last Vitals:  Filed Vitals:   10/29/14 1433  BP: 127/56  Pulse: 66  Temp: 36.5 C  Resp: 18    Complications: No apparent anesthesia complications

## 2014-12-11 ENCOUNTER — Ambulatory Visit: Payer: Medicare Other | Admitting: Family

## 2014-12-11 ENCOUNTER — Other Ambulatory Visit (HOSPITAL_COMMUNITY): Payer: Medicare Other

## 2014-12-30 ENCOUNTER — Telehealth: Payer: Self-pay | Admitting: Cardiology

## 2014-12-30 NOTE — Telephone Encounter (Signed)
Lm w/Amanda's VM that Dr. Anne FuSkains not in office today but will forward message to him regarding surgical clearance and stopping Plavix on 3/10

## 2014-12-30 NOTE — Telephone Encounter (Signed)
Request for surgical clearance:  1. What type of surgery is being performed? Spinal Nerve Block  2. When is this surgery scheduled? 01/07/2015  Are there any medications that need to be held prior to surgery and how long? Stop Plavics on 01/01/15  3. Name of physician performing surgery? Dr. Sheran Luzichard Ramos  4. What is your office phone and fax number? Fax: [phone] 5.

## 2014-12-31 NOTE — Telephone Encounter (Signed)
Spoke with Marchelle Folksmanda who is aware OK to stop Plavix for spinal injection.  Paperwork taken to MR to be faxed.

## 2014-12-31 NOTE — Telephone Encounter (Signed)
OK to stop plavix prior to spinal injection

## 2015-01-06 ENCOUNTER — Encounter (HOSPITAL_COMMUNITY): Payer: Self-pay | Admitting: Emergency Medicine

## 2015-01-06 ENCOUNTER — Other Ambulatory Visit (HOSPITAL_COMMUNITY): Payer: Self-pay

## 2015-01-06 ENCOUNTER — Inpatient Hospital Stay (HOSPITAL_COMMUNITY)
Admission: EM | Admit: 2015-01-06 | Discharge: 2015-01-13 | DRG: 287 | Disposition: A | Discharge status: Home / Self Care | Payer: Medicare Other | Attending: Cardiovascular Disease | Admitting: Cardiovascular Disease

## 2015-01-06 ENCOUNTER — Emergency Department (HOSPITAL_COMMUNITY): Payer: Medicare Other

## 2015-01-06 DIAGNOSIS — E875 Hyperkalemia: Secondary | ICD-10-CM | POA: Diagnosis present

## 2015-01-06 DIAGNOSIS — R079 Chest pain, unspecified: Secondary | ICD-10-CM | POA: Diagnosis not present

## 2015-01-06 DIAGNOSIS — Z9981 Dependence on supplemental oxygen: Secondary | ICD-10-CM | POA: Diagnosis not present

## 2015-01-06 DIAGNOSIS — I10 Essential (primary) hypertension: Secondary | ICD-10-CM | POA: Diagnosis not present

## 2015-01-06 DIAGNOSIS — G8929 Other chronic pain: Secondary | ICD-10-CM | POA: Diagnosis present

## 2015-01-06 DIAGNOSIS — N183 Chronic kidney disease, stage 3 (moderate): Secondary | ICD-10-CM | POA: Diagnosis present

## 2015-01-06 DIAGNOSIS — Z87891 Personal history of nicotine dependence: Secondary | ICD-10-CM | POA: Diagnosis not present

## 2015-01-06 DIAGNOSIS — J449 Chronic obstructive pulmonary disease, unspecified: Secondary | ICD-10-CM | POA: Diagnosis present

## 2015-01-06 DIAGNOSIS — I70219 Atherosclerosis of native arteries of extremities with intermittent claudication, unspecified extremity: Secondary | ICD-10-CM | POA: Diagnosis not present

## 2015-01-06 DIAGNOSIS — I25110 Atherosclerotic heart disease of native coronary artery with unstable angina pectoris: Secondary | ICD-10-CM | POA: Diagnosis not present

## 2015-01-06 DIAGNOSIS — J810 Acute pulmonary edema: Secondary | ICD-10-CM | POA: Insufficient documentation

## 2015-01-06 DIAGNOSIS — T17990A Other foreign object in respiratory tract, part unspecified in causing asphyxiation, initial encounter: Secondary | ICD-10-CM | POA: Diagnosis present

## 2015-01-06 DIAGNOSIS — IMO0002 Reserved for concepts with insufficient information to code with codable children: Secondary | ICD-10-CM | POA: Diagnosis present

## 2015-01-06 DIAGNOSIS — R9439 Abnormal result of other cardiovascular function study: Secondary | ICD-10-CM | POA: Insufficient documentation

## 2015-01-06 DIAGNOSIS — J441 Chronic obstructive pulmonary disease with (acute) exacerbation: Secondary | ICD-10-CM

## 2015-01-06 DIAGNOSIS — Z23 Encounter for immunization: Secondary | ICD-10-CM | POA: Diagnosis not present

## 2015-01-06 DIAGNOSIS — E785 Hyperlipidemia, unspecified: Secondary | ICD-10-CM | POA: Diagnosis present

## 2015-01-06 DIAGNOSIS — M79673 Pain in unspecified foot: Secondary | ICD-10-CM

## 2015-01-06 DIAGNOSIS — I5043 Acute on chronic combined systolic (congestive) and diastolic (congestive) heart failure: Principal | ICD-10-CM

## 2015-01-06 DIAGNOSIS — N189 Chronic kidney disease, unspecified: Secondary | ICD-10-CM | POA: Diagnosis not present

## 2015-01-06 DIAGNOSIS — K219 Gastro-esophageal reflux disease without esophagitis: Secondary | ICD-10-CM | POA: Diagnosis present

## 2015-01-06 DIAGNOSIS — Z7982 Long term (current) use of aspirin: Secondary | ICD-10-CM

## 2015-01-06 DIAGNOSIS — Z8673 Personal history of transient ischemic attack (TIA), and cerebral infarction without residual deficits: Secondary | ICD-10-CM | POA: Diagnosis not present

## 2015-01-06 DIAGNOSIS — Z79899 Other long term (current) drug therapy: Secondary | ICD-10-CM | POA: Diagnosis not present

## 2015-01-06 DIAGNOSIS — I739 Peripheral vascular disease, unspecified: Secondary | ICD-10-CM | POA: Diagnosis present

## 2015-01-06 DIAGNOSIS — Z951 Presence of aortocoronary bypass graft: Secondary | ICD-10-CM | POA: Diagnosis not present

## 2015-01-06 DIAGNOSIS — E1165 Type 2 diabetes mellitus with hyperglycemia: Secondary | ICD-10-CM | POA: Diagnosis present

## 2015-01-06 DIAGNOSIS — Z794 Long term (current) use of insulin: Secondary | ICD-10-CM | POA: Diagnosis not present

## 2015-01-06 DIAGNOSIS — Z7902 Long term (current) use of antithrombotics/antiplatelets: Secondary | ICD-10-CM | POA: Diagnosis not present

## 2015-01-06 DIAGNOSIS — I252 Old myocardial infarction: Secondary | ICD-10-CM

## 2015-01-06 DIAGNOSIS — I2582 Chronic total occlusion of coronary artery: Secondary | ICD-10-CM | POA: Diagnosis present

## 2015-01-06 DIAGNOSIS — Z930 Tracheostomy status: Secondary | ICD-10-CM | POA: Diagnosis not present

## 2015-01-06 DIAGNOSIS — I130 Hypertensive heart and chronic kidney disease with heart failure and stage 1 through stage 4 chronic kidney disease, or unspecified chronic kidney disease: Secondary | ICD-10-CM | POA: Diagnosis present

## 2015-01-06 DIAGNOSIS — I2510 Atherosclerotic heart disease of native coronary artery without angina pectoris: Secondary | ICD-10-CM | POA: Diagnosis present

## 2015-01-06 DIAGNOSIS — I272 Other secondary pulmonary hypertension: Secondary | ICD-10-CM | POA: Diagnosis present

## 2015-01-06 DIAGNOSIS — R0600 Dyspnea, unspecified: Secondary | ICD-10-CM | POA: Diagnosis not present

## 2015-01-06 DIAGNOSIS — R0602 Shortness of breath: Secondary | ICD-10-CM | POA: Diagnosis present

## 2015-01-06 DIAGNOSIS — N179 Acute kidney failure, unspecified: Secondary | ICD-10-CM | POA: Diagnosis present

## 2015-01-06 DIAGNOSIS — R200 Anesthesia of skin: Secondary | ICD-10-CM

## 2015-01-06 DIAGNOSIS — I5021 Acute systolic (congestive) heart failure: Secondary | ICD-10-CM

## 2015-01-06 DIAGNOSIS — I259 Chronic ischemic heart disease, unspecified: Secondary | ICD-10-CM

## 2015-01-06 LAB — BASIC METABOLIC PANEL
ANION GAP: 10 (ref 5–15)
BUN: 33 mg/dL — ABNORMAL HIGH (ref 6–23)
CHLORIDE: 107 mmol/L (ref 96–112)
CO2: 26 mmol/L (ref 19–32)
Calcium: 8.6 mg/dL (ref 8.4–10.5)
Creatinine, Ser: 1.39 mg/dL — ABNORMAL HIGH (ref 0.50–1.10)
GFR calc Af Amer: 44 mL/min — ABNORMAL LOW (ref >90)
GFR calc non Af Amer: 38 mL/min — ABNORMAL LOW (ref >90)
Glucose, Bld: 248 mg/dL — ABNORMAL HIGH (ref 70–99)
POTASSIUM: 4.1 mmol/L (ref 3.5–5.1)
Sodium: 143 mmol/L (ref 135–145)

## 2015-01-06 LAB — CBC
HEMATOCRIT: 30.4 % — AB (ref 36.0–46.0)
Hemoglobin: 9.6 g/dL — ABNORMAL LOW (ref 12.0–15.0)
MCH: 30.2 pg (ref 26.0–34.0)
MCHC: 31.6 g/dL (ref 30.0–36.0)
MCV: 95.6 fL (ref 78.0–100.0)
PLATELETS: 165 10*3/uL (ref 150–400)
RBC: 3.18 MIL/uL — AB (ref 3.87–5.11)
RDW: 16.7 % — ABNORMAL HIGH (ref 11.5–15.5)
WBC: 6.3 10*3/uL (ref 4.0–10.5)

## 2015-01-06 LAB — I-STAT TROPONIN, ED: TROPONIN I, POC: 0.09 ng/mL — AB (ref 0.00–0.08)

## 2015-01-06 LAB — PROTIME-INR
INR: 1.14 (ref 0.00–1.49)
PROTHROMBIN TIME: 14.8 s (ref 11.6–15.2)

## 2015-01-06 LAB — TROPONIN I
TROPONIN I: 0.07 ng/mL — AB (ref <0.031)
Troponin I: 0.07 ng/mL — ABNORMAL HIGH (ref <0.031)

## 2015-01-06 LAB — GLUCOSE, CAPILLARY: Glucose-Capillary: 224 mg/dL — ABNORMAL HIGH (ref 70–99)

## 2015-01-06 LAB — BRAIN NATRIURETIC PEPTIDE: B Natriuretic Peptide: 1061.8 pg/mL — ABNORMAL HIGH (ref 0.0–100.0)

## 2015-01-06 MED ORDER — HYDRALAZINE HCL 20 MG/ML IJ SOLN
10.0000 mg | INTRAMUSCULAR | Status: DC | PRN
  Administered 2015-01-06: 10 mg via INTRAVENOUS
  Filled 2015-01-06: qty 1

## 2015-01-06 MED ORDER — WOMENS 50+ MULTI VITAMIN/MIN PO TABS: 1.0000 | ORAL_TABLET | Freq: Every day | ORAL | Status: DC

## 2015-01-06 MED ORDER — FUROSEMIDE 10 MG/ML IJ SOLN
40.0000 mg | Freq: Two times a day (BID) | INTRAMUSCULAR | Status: DC
  Administered 2015-01-06 – 2015-01-08 (×5): 40 mg via INTRAVENOUS
  Filled 2015-01-06 (×6): qty 4

## 2015-01-06 MED ORDER — INSULIN GLARGINE 300 UNIT/ML ~~LOC~~ SOPN: 16.0000 [IU] | PEN_INJECTOR | Freq: Every day | SUBCUTANEOUS | Status: DC

## 2015-01-06 MED ORDER — SODIUM CHLORIDE 0.9 % IJ SOLN: 3.0000 mL | INTRAMUSCULAR | Status: DC | PRN

## 2015-01-06 MED ORDER — ADULT MULTIVITAMIN W/MINERALS CH
1.0000 | ORAL_TABLET | Freq: Every day | ORAL | Status: DC
  Administered 2015-01-06 – 2015-01-13 (×8): 1 via ORAL
  Filled 2015-01-06 (×8): qty 1

## 2015-01-06 MED ORDER — FERROUS SULFATE 325 (65 FE) MG PO TABS
325.0000 mg | ORAL_TABLET | Freq: Two times a day (BID) | ORAL | Status: DC
  Administered 2015-01-06 – 2015-01-13 (×14): 325 mg via ORAL
  Filled 2015-01-06 (×16): qty 1

## 2015-01-06 MED ORDER — NITROGLYCERIN 2 % TD OINT
1.0000 [in_us] | TOPICAL_OINTMENT | Freq: Once | TRANSDERMAL | Status: AC
  Administered 2015-01-06: 1 [in_us] via TOPICAL
  Filled 2015-01-06: qty 1

## 2015-01-06 MED ORDER — DULOXETINE HCL 60 MG PO CPEP
60.0000 mg | ORAL_CAPSULE | Freq: Every day | ORAL | Status: DC
  Administered 2015-01-06 – 2015-01-13 (×8): 60 mg via ORAL
  Filled 2015-01-06 (×8): qty 1

## 2015-01-06 MED ORDER — SIMVASTATIN 40 MG PO TABS
40.0000 mg | ORAL_TABLET | Freq: Every evening | ORAL | Status: DC
  Administered 2015-01-06 – 2015-01-12 (×7): 40 mg via ORAL
  Filled 2015-01-06 (×8): qty 1

## 2015-01-06 MED ORDER — OXYCODONE-ACETAMINOPHEN 5-325 MG PO TABS: 0.5000 | ORAL_TABLET | Freq: Four times a day (QID) | ORAL | Status: DC | PRN

## 2015-01-06 MED ORDER — ENOXAPARIN SODIUM 40 MG/0.4ML ~~LOC~~ SOLN
40.0000 mg | SUBCUTANEOUS | Status: DC
  Administered 2015-01-06 – 2015-01-12 (×6): 40 mg via SUBCUTANEOUS
  Filled 2015-01-06 (×8): qty 0.4

## 2015-01-06 MED ORDER — SODIUM CHLORIDE 0.9 % IJ SOLN
3.0000 mL | Freq: Two times a day (BID) | INTRAMUSCULAR | Status: DC
  Administered 2015-01-07 – 2015-01-13 (×13): 3 mL via INTRAVENOUS

## 2015-01-06 MED ORDER — CARVEDILOL 25 MG PO TABS
25.0000 mg | ORAL_TABLET | Freq: Two times a day (BID) | ORAL | Status: DC
  Administered 2015-01-06 – 2015-01-13 (×14): 25 mg via ORAL
  Filled 2015-01-06 (×16): qty 1

## 2015-01-06 MED ORDER — SODIUM CHLORIDE 0.9 % IV SOLN: 250.0000 mL | INTRAVENOUS | Status: DC | PRN

## 2015-01-06 MED ORDER — INSULIN GLARGINE 100 UNIT/ML ~~LOC~~ SOLN
16.0000 [IU] | Freq: Every day | SUBCUTANEOUS | Status: DC
  Administered 2015-01-06 – 2015-01-12 (×7): 16 [IU] via SUBCUTANEOUS
  Filled 2015-01-06 (×9): qty 0.16

## 2015-01-06 MED ORDER — ASPIRIN EC 81 MG PO TBEC
81.0000 mg | DELAYED_RELEASE_TABLET | Freq: Every day | ORAL | Status: DC
  Administered 2015-01-06 – 2015-01-13 (×8): 81 mg via ORAL
  Filled 2015-01-06 (×8): qty 1

## 2015-01-06 MED ORDER — NITROGLYCERIN 2 % TD OINT
0.5000 [in_us] | TOPICAL_OINTMENT | Freq: Four times a day (QID) | TRANSDERMAL | Status: DC
  Administered 2015-01-06 – 2015-01-10 (×16): 0.5 [in_us] via TOPICAL
  Filled 2015-01-06 (×10): qty 30
  Filled 2015-01-06: qty 1
  Filled 2015-01-06 (×19): qty 30

## 2015-01-06 MED ORDER — INSULIN ASPART 100 UNIT/ML ~~LOC~~ SOLN
0.0000 [IU] | Freq: Three times a day (TID) | SUBCUTANEOUS | Status: DC
  Administered 2015-01-07: 5 [IU] via SUBCUTANEOUS
  Administered 2015-01-08 – 2015-01-09 (×3): 3 [IU] via SUBCUTANEOUS
  Administered 2015-01-09: 8 [IU] via SUBCUTANEOUS
  Administered 2015-01-10: 3 [IU] via SUBCUTANEOUS
  Administered 2015-01-10: 2 [IU] via SUBCUTANEOUS
  Administered 2015-01-10 – 2015-01-11 (×2): 3 [IU] via SUBCUTANEOUS
  Administered 2015-01-11: 8 [IU] via SUBCUTANEOUS
  Administered 2015-01-12: 5 [IU] via SUBCUTANEOUS
  Administered 2015-01-12: 6 [IU] via SUBCUTANEOUS
  Administered 2015-01-12: 2 [IU] via SUBCUTANEOUS
  Administered 2015-01-13 (×2): 3 [IU] via SUBCUTANEOUS

## 2015-01-06 MED ORDER — IPRATROPIUM-ALBUTEROL 0.5-2.5 (3) MG/3ML IN SOLN: 3.0000 mL | Freq: Two times a day (BID) | RESPIRATORY_TRACT | Status: DC | PRN

## 2015-01-06 MED ORDER — ZOLPIDEM TARTRATE 5 MG PO TABS: 5.0000 mg | ORAL_TABLET | Freq: Every evening | ORAL | Status: DC | PRN

## 2015-01-06 MED ORDER — ALPRAZOLAM 0.25 MG PO TABS: 0.2500 mg | ORAL_TABLET | Freq: Two times a day (BID) | ORAL | Status: DC | PRN

## 2015-01-06 MED ORDER — CLOPIDOGREL BISULFATE 75 MG PO TABS
75.0000 mg | ORAL_TABLET | Freq: Every day | ORAL | Status: DC
  Administered 2015-01-06 – 2015-01-13 (×8): 75 mg via ORAL
  Filled 2015-01-06 (×8): qty 1

## 2015-01-06 MED ORDER — ACETAMINOPHEN 325 MG PO TABS
650.0000 mg | ORAL_TABLET | ORAL | Status: DC | PRN
  Administered 2015-01-08: 650 mg via ORAL
  Filled 2015-01-06: qty 2

## 2015-01-06 MED ORDER — IRBESARTAN 300 MG PO TABS
300.0000 mg | ORAL_TABLET | Freq: Every day | ORAL | Status: DC
  Administered 2015-01-06 – 2015-01-12 (×7): 300 mg via ORAL
  Filled 2015-01-06 (×8): qty 1

## 2015-01-06 MED ORDER — FUROSEMIDE 10 MG/ML IJ SOLN
40.0000 mg | Freq: Once | INTRAMUSCULAR | Status: AC
  Administered 2015-01-06: 40 mg via INTRAVENOUS
  Filled 2015-01-06: qty 4

## 2015-01-06 MED ORDER — STARCH 51 % RE SUPP: 1.0000 | RECTAL | Status: DC | PRN

## 2015-01-06 MED ORDER — GABAPENTIN 300 MG PO CAPS
300.0000 mg | ORAL_CAPSULE | Freq: Two times a day (BID) | ORAL | Status: DC
  Administered 2015-01-06 – 2015-01-13 (×15): 300 mg via ORAL
  Filled 2015-01-06 (×16): qty 1

## 2015-01-06 MED ORDER — NITROGLYCERIN 0.4 MG SL SUBL: 0.4000 mg | SUBLINGUAL_TABLET | SUBLINGUAL | Status: DC | PRN

## 2015-01-06 MED ORDER — ONDANSETRON HCL 4 MG/2ML IJ SOLN: 4.0000 mg | Freq: Four times a day (QID) | INTRAMUSCULAR | Status: DC | PRN

## 2015-01-06 MED ORDER — NITROGLYCERIN 2 % TD OINT
2.0000 [in_us] | TOPICAL_OINTMENT | Freq: Once | TRANSDERMAL | Status: AC
Start: 2015-01-06 — End: 2015-01-06
  Administered 2015-01-06: 2 [in_us] via TOPICAL
  Filled 2015-01-06: qty 1

## 2015-01-06 NOTE — ED Notes (Signed)
Pt used bedside commode without difficulty.

## 2015-01-06 NOTE — ED Provider Notes (Signed)
CSN: [id number]     Arrival date & time 01/06/15  0447 History   First MD Initiated Contact with Patient 01/06/15 0450     Chief Complaint  Patient presents with  . Shortness of Breath  . Chest Pain     (Consider location/radiation/quality/duration/timing/severity/associated sxs/prior Treatment) HPI Comments: 69 year old female with history of heart attack, CAD, CABG, high blood pressure, renal failure, tracheostomy presents with gradually worsening shortness of breath and chest pain this evening. Patient has had worsening edema in her legs feet, unknown amount of weight gain. Patient took an extra dose of Lasix yesterday with no improvement. Currently no significant chest pain however patient does have shortness of breath. Nitroglycerin procedures or shortness of breath. Similar to previous but more significant.  Patient is a 69 y.o. female presenting with shortness of breath and chest pain. The history is provided by the patient.  Shortness of Breath Associated symptoms: chest pain   Associated symptoms: no abdominal pain, no fever, no headaches, no neck pain, no rash and no vomiting   Chest Pain Associated symptoms: shortness of breath   Associated symptoms: no abdominal pain, no back pain, no fever, no headache and not vomiting     Past Medical History  Diagnosis Date  . Diabetes mellitus     on Lantus 30 U   . CAD (coronary artery disease)     S/p CABG and stenting  . PAD (peripheral artery disease)   . Stroke     MRI 11/2011 with remote occipital lobe. MRA with moderate left focal vertebral artery stenosis  . CHF (congestive heart failure) 11/2011    Echo with EF 30-35%, global hypokinesis, and inferior akinesis  . Hyperlipidemia   . DDD (degenerative disc disease), lumbar   . CVA (cerebral vascular accident) 11/2010  . MI (myocardial infarction) 1997  . COPD (chronic obstructive pulmonary disease)   . Anemia   . Irregular heart beat   . Hypertension   . GERD  (gastroesophageal reflux disease)   . History of IBS   . Hypothyroidism     Goiter  . Thyroid disease   . Carotid artery occlusion   . Chronic kidney disease     stage 3  . History of tracheostomy 08/26/2013    Dr. Jenne Pane   Past Surgical History  Procedure Laterality Date  . Ptca    . Thyroidectomy    . Coronary artery bypass graft      2 vessel  . Carotid endarterectomy  ~2008    Left   . Cholecystectomy    . Tracheostomy tube placement  01/02/2012  . Angioplasty  4098-1191    Aortogram by Dr. Italy McK[facility])  . Pr vein bypass graft,aorto-fem-pop      Right common femoral-AK popliteal BPG & Right Popliteal-posterior tibial  . Pr vein bypass graft,aorto-fem-pop      Left Fem-pop BPG  . Carpal tunnel release Right   . Femoral-tibial bypass graft Left 06/11/2013    Procedure: BYPASS GRAFT  LEFT FEMORAL- POSTERIOR TIBIAL ARTERY/ REDO;  Surgeon: Sherren Kerns, MD;  Location: [facility] OR;  Service: Vascular;  Laterality: Left;  . Thrombectomy femoral artery Left 06/11/2013    Procedure: THROMBECTOMY FEMORAL ARTERY;  Surgeon: Sherren Kerns, MD;  Location: [facility] OR;  Service: Vascular;  Laterality: Left;  . Spine surgery  Oct. 27, 2014    Injection - Back  . Abdominal aortagram N/A 04/05/2013    Procedure: ABDOMINAL Ronny Flurry;  Surgeon: Sherren Kerns, MD;  Location: [facility] CATH LAB;  Service: Cardiovascular;  Laterality: N/A;  . Colonoscopy N/A 10/27/2014    Procedure: COLONOSCOPY;  Surgeon: Willis Modena, MD;  Location: [facility] ENDOSCOPY;  Service: Endoscopy;  Laterality: N/A;   Family History  Problem Relation Age of Onset  . Hyperlipidemia Mother   . Other Mother     AAA  . Alzheimer's disease Mother   . Heart disease Mother   . Irregular heart beat Mother   . Diabetes Daughter   . Hypertension Daughter    History  Substance Use Topics  . Smoking status: Former Smoker -- 1.00 packs/day for 50 years    Types: Cigarettes    Quit date: 01/24/2011  . Smokeless tobacco:  Never Used  . Alcohol Use: No   OB History    No data available     Review of Systems  Constitutional: Positive for unexpected weight change. Negative for fever and chills.  HENT: Negative for congestion.   Eyes: Negative for visual disturbance.  Respiratory: Positive for shortness of breath.   Cardiovascular: Positive for chest pain and leg swelling.  Gastrointestinal: Negative for vomiting and abdominal pain.  Genitourinary: Negative for dysuria and flank pain.  Musculoskeletal: Negative for back pain, neck pain and neck stiffness.  Skin: Negative for rash.  Neurological: Negative for light-headedness and headaches.      Allergies  Aldactone; Lisinopril; Crestor; and Vicodin  Home Medications   Prior to Admission medications   Medication Sig Start Date End Date Taking? Authorizing Provider  aspirin EC 81 MG tablet Take 81 mg by mouth daily.   Yes Historical Provider, MD  carvedilol (COREG) 25 MG tablet Take 25 mg by mouth 2 (two) times daily with a meal. 11/28/11  Yes Danley Danker, MD  clopidogrel (PLAVIX) 75 MG tablet Take 75 mg by mouth daily. 11/28/11  Yes Danley Danker, MD  DULoxetine (CYMBALTA) 60 MG capsule Take 60 mg by mouth daily. 08/06/14  Yes Historical Provider, MD  ferrous sulfate 325 (65 FE) MG tablet Take 1 tablet (325 mg total) by mouth 2 (two) times daily with a meal. 10/29/14  Yes Starleen Arms, MD  furosemide (LASIX) 40 MG tablet Take 1 tablet (40 mg total) by mouth 2 (two) times daily. 08/11/13  Yes Blane Ohara, MD  gabapentin (NEURONTIN) 300 MG capsule Take 1 capsule (300 mg total) by mouth 2 (two) times daily. 07/09/14  Yes Lenn Sink, DPM  insulin aspart (NOVOLOG FLEXPEN) 100 UNIT/ML SOPN FlexPen Inject 1-12 Units into the skin 3 (three) times daily with meals. As needed per sliding scale   Yes Historical Provider, MD  Insulin Glargine (TOUJEO SOLOSTAR) 300 UNIT/ML SOPN Inject 16 Units into the skin at bedtime. 10/29/14  Yes Leana Roe  Elgergawy, MD  ipratropium-albuterol (DUONEB) 0.5-2.5 (3) MG/3ML SOLN Take 3 mLs by nebulization 2 (two) times daily as needed (for shortnes of breath).    Yes Historical Provider, MD  irbesartan (AVAPRO) 300 MG tablet Take 1 tablet (300 mg total) by mouth at bedtime. 01/05/13  Yes Russella Dar, NP  Multiple Vitamins-Minerals (WOMENS 50+ MULTI VITAMIN/MIN) TABS Take 1 tablet by mouth daily.   Yes Historical Provider, MD  nitroGLYCERIN (NITROSTAT) 0.4 MG SL tablet Place 0.4 mg under the tongue every 5 (five) minutes as needed. For chest pain   Yes Historical Provider, MD  oxyCODONE-acetaminophen (PERCOCET/ROXICET) 5-325 MG per tablet Take 1 tablet by mouth every 6 (six) hours as needed for moderate pain.  12/29/14  Yes Historical  Provider, MD  simvastatin (ZOCOR) 40 MG tablet Take 40 mg by mouth every evening.  11/28/11  Yes Danley Danker, MD  furosemide (LASIX) 80 MG tablet Take 1 tablet (80 mg total) by mouth 2 (two) times daily as needed (take 1 tablet twice a day for 3 days - then prn). Patient not taking: Reported on 01/06/2015 07/17/14   [name], MD  glucose ([employer] GLUCOSE) 4 GM chewable tablet Chew 1 tablet by mouth as needed for low blood sugar.    Historical Provider, MD  loperamide (IMODIUM A-D) 2 MG tablet Take 1 tablet (2 mg total) by mouth 3 (three) times daily as needed for diarrhea or loose stools. Patient not taking: Reported on 01/06/2015 10/29/14   Leana Roe Elgergawy, MD  OXYGEN-HELIUM IN Inhale 4 L into the lungs at bedtime.     Historical Provider, MD  starch (ANUSOL) 51 % suppository Place 1 suppository rectally as needed for pain. 10/29/14   Leana Roe Elgergawy, MD  traMADol (ULTRAM) 50 MG tablet Take 1 tablet (50 mg total) by mouth every 6 (six) hours as needed. Patient not taking: Reported on 01/06/2015 01/16/14   Carma Lair Nickel, NP   BP 191/90 mmHg  Pulse 80  Resp 25  Ht  (1.575 m)  Wt 172 lb (78.019 kg)  BMI 31.45 kg/m2  SpO2 97% Physical Exam  Constitutional:  She is oriented to person, place, and time. She appears well-developed and well-nourished. No distress.  HENT:  Head: Normocephalic and atraumatic.  Eyes: Conjunctivae are normal. Right eye exhibits no discharge. Left eye exhibits no discharge.  Neck: Normal range of motion. Neck supple. No tracheal deviation present.  Cardiovascular: Normal rate and regular rhythm.   Pulmonary/Chest: No respiratory distress. She has no wheezes. She has rales (crackles bases).  Abdominal: Soft. She exhibits no distension. There is no tenderness. There is no guarding.  Musculoskeletal: She exhibits edema (2+ bilateral).  Neurological: She is alert and oriented to person, place, and time.  Skin: Skin is warm. No rash noted.  Psychiatric: She has a normal mood and affect.  Nursing note and vitals reviewed.   ED Course  Procedures (including critical care time) Labs Review Labs Reviewed  CBC - Abnormal; Notable for the following:    RBC 3.18 (*)    Hemoglobin 9.6 (*)    HCT 30.4 (*)    RDW 16.7 (*)    All other components within normal limits  BASIC METABOLIC PANEL - Abnormal; Notable for the following:    Glucose, Bld 248 (*)    BUN 33 (*)    Creatinine, Ser 1.39 (*)    GFR calc non Af Amer 38 (*)    GFR calc Af Amer 44 (*)    All other components within normal limits  BRAIN NATRIURETIC PEPTIDE - Abnormal; Notable for the following:    B Natriuretic Peptide 1061.8 (*)    All other components within normal limits  I-STAT TROPOININ, ED - Abnormal; Notable for the following:    Troponin i, poc 0.09 (*)    All other components within normal limits  TROPONIN I    Imaging Review Dg Chest Port 1 View  01/06/2015   CLINICAL DATA:  Dyspnea and chest pain  EXAM: PORTABLE CHEST - 1 VIEW  COMPARISON:  08/11/2013  FINDINGS: There is prominent cardiomegaly, unchanged. There is prior sternotomy. There is a tracheostomy appliance in the midline. There is extensive ground-glass opacity in the central and  basilar regions  which could represent alveolar edema. Moderate vascular and interstitial prominence is present. The findings may represent congestive heart failure. No large effusions are evident.  IMPRESSION: Marked cardiomegaly.  Probable congestive heart failure.   Electronically Signed   By: Ellery Plunkaniel R Mitchell M.D.   On: 01/06/2015 05:58     EKG Interpretation None     EKG reviewed heart rate 70, PVCs, normal QT, nonspecific ST changes, overall similar to previous except V5 V6 T-wave inversions. MDM   Final diagnoses:  Acute pulmonary edema  Acute systolic congestive heart failure  Chest pain, unspecified chest pain type   Patient presents with clinically acute pulmonary edema. Patient not requiring oxygen in the ER no distress however blood pressure elevated crackles at the bases. IV insulin ordered after assessment, cardiac screen ordered, mild elevated troponin formal troponin sent. Discussed with cardiology will evaluate the patient and likely admit.  The patients results and plan were reviewed and discussed.   Any x-rays performed were personally reviewed by myself.   Differential diagnosis were considered with the presenting HPI.  Medications  nitroGLYCERIN (NITROGLYN) 2 % ointment 1 inch (1 inch Topical Given 01/06/15 0601)  furosemide (LASIX) injection 40 mg (40 mg Intravenous Given 01/06/15 0551)    Filed Vitals:   01/06/15 0615 01/06/15 0630 01/06/15 0700 01/06/15 0715  BP: 199/87 192/74 174/91 191/90  Pulse: 72 72 79 80  TempSrc:      Resp: 22 27 19 25   Height:      Weight:      SpO2: 95% 94% 97% 97%    Final diagnoses:  Acute pulmonary edema  Acute systolic congestive heart failure  Chest pain, unspecified chest pain type    Admission/ observation were discussed with the admitting physician, patient and/or family and they are comfortable with the plan.    Blane OharaJoshua Markeis Allman, MD 01/06/15 [phone]

## 2015-01-06 NOTE — ED Notes (Signed)
Unable to ambulate in hall to check pulse ox

## 2015-01-06 NOTE — ED Notes (Signed)
Report attempted 

## 2015-01-06 NOTE — ED Notes (Signed)
Per EMS: onset chest pain and sob 4 hours ago.  Trach patient.  She also said that she noticed that she has some edema in her feet and ankles, abdomen is distended.  Initially rated pain at 8/10, given ntg brought it down to 5, and no pain after 2nd ntg.   On NRB.  Initially ems did suction trach which eased her breathing, retrieved a mucus plug.   Respiratory at bedside.  CBG 288.   EMS VS:  162/86, 72 NSR, 96% on NRB, initially 88% on room air.

## 2015-01-06 NOTE — H&P (Signed)
History and Physical   Patient ID: [name] MRN: [id number], DOB/AGE: [birth year] 69 y.o. Date of Encounter: 01/06/2015  Primary Physician: Cala Bradford, MD Primary Cardiologist: Veverly Fells. Skains  Chief Complaint:  SOB and Chest pain  HPI: [name] is a 69 y.o. female whom is somewhat of a poor historian; her daughter is at the bedside and able to assist with some questions. She has an extensive past medical history of CAD s/p CABG and stenting done approximately 20 years ago in [place], she does not know which arteries were bypassed; HTN, HLD, DM, PVD, she underwent Left external iliac to posterior tibial artery bypass using nonreversed ipsilateral greater saphenous vein by Dr. 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 She only requires oxygen at night and when she leaves her home.   She became SOB at rest yesterday around 1800, she has PRN nebulizer treatments at home but she did not do a treatment or increase her oxygen level. The SOB got worse with exertion and around 0300 she developed mid-sternal chest pressure without radiation, the pressure would come and go, she did not take a nitroglycerin. Her daughter, whom she lives with, became concerned and felt it was best to call EMS.   When EMS arrived her chest pain was an 8/10 she was given aspirin and nitro x 2 with full relief of chest pain after second dose. EMS suctioned her trach and retrieved a mucus plug which eased her breathing. Upon arrival to the ED she was much more comfortable with chest pain at a 2/10, 02 sats 92% on room air, heart rate and BP were WNL. Her BNP  upon arrival was 1061.8, Chest x-ray showed marked cardiomegaly, and she had an elevated troponin level of 0.09. EKG without signs of a STEMI.   She has been taking all over medications as prescribed but admits she has not been weighing herself daily or following a heart healthy diet. Last known weight was 176 lbs which she says was from an MD appointment 3 months ago. She did notice on Saturday that she was having swelling in her bilateral lower extremities and that her abdomen felt distended. She did have some nausea this a.m. not correlated with the chest pain, no vomiting. Denies any dizziness or weakness. Ambulates at home with a walker and cane.    Past Medical History  Diagnosis Date  . Diabetes mellitus     on Lantus 30 U   . CAD (coronary artery disease)     S/p CABG and stenting  . PAD (peripheral artery disease)   . Stroke     MRI 11/2011 with remote occipital lobe. MRA with moderate left focal vertebral artery stenosis  . CHF (congestive heart failure) 11/2011    Echo with EF 30-35%, global hypokinesis, and inferior akinesis  . Hyperlipidemia   . DDD (degenerative disc disease), lumbar   . CVA (cerebral vascular accident) 11/2010  . MI (myocardial infarction) 1997  . COPD (chronic obstructive pulmonary disease)   . Anemia   . Irregular heart beat   . Hypertension   .  GERD (gastroesophageal reflux disease)   . History of IBS   . Hypothyroidism     Goiter  . Thyroid disease   . Carotid artery occlusion   . Chronic kidney disease     stage 3  . History of tracheostomy 08/26/2013    Dr. Jenne Pane    Surgical History:  Past Surgical History  Procedure Laterality Date  . Ptca    . Thyroidectomy    . Coronary artery bypass graft      2 vessel  . Carotid endarterectomy  ~2008    Left   . Cholecystectomy    . Tracheostomy tube placement  01/02/2012  . Angioplasty  [id number]    Aortogram by Dr. [place] [facility])  . Pr vein bypass graft,aorto-fem-pop      Right  common femoral-AK popliteal BPG & Right Popliteal-posterior tibial  . Pr vein bypass graft,aorto-fem-pop      Left Fem-pop BPG  . Carpal tunnel release Right   . Femoral-tibial bypass graft Left 06/11/2013    Procedure: BYPASS GRAFT  LEFT FEMORAL- POSTERIOR TIBIAL ARTERY/ REDO;  Surgeon: Sherren Kerns, MD;  Location: [facility] OR;  Service: Vascular;  Laterality: Left;  . Thrombectomy femoral artery Left 06/11/2013    Procedure: THROMBECTOMY FEMORAL ARTERY;  Surgeon: Sherren Kerns, MD;  Location: [facility] OR;  Service: Vascular;  Laterality: Left;  . Spine surgery  Oct. 27, 2014    Injection - Back  . Abdominal aortagram N/A 04/05/2013    Procedure: ABDOMINAL Ronny Flurry;  Surgeon: Sherren Kerns, MD;  Location: [facility] CATH LAB;  Service: Cardiovascular;  Laterality: N/A;  . Colonoscopy N/A 10/27/2014    Procedure: COLONOSCOPY;  Surgeon: Willis Modena, MD;  Location[facility] ENDOSCOPY;  Service: Endoscopy;  Laterality: N/A;     I have reviewed the patient's current medications. Medication Sig  aspirin EC 81 MG tablet Take 81 mg by mouth daily.  carvedilol (COREG) 25 MG tablet Take 25 mg by mouth 2 (two) times daily with a meal.  clopidogrel (PLAVIX) 75 MG tablet Take 75 mg by mouth daily.  DULoxetine (CYMBALTA) 60 MG capsule Take 60 mg by mouth daily.  ferrous sulfate 325 (65 FE) MG tablet Take 1 tablet (325 mg total) by mouth 2 (two) times daily with a meal.  furosemide (LASIX) 40 MG tablet Take 1 tablet (40 mg total) by mouth 2 (two) times daily.  gabapentin (NEURONTIN) 300 MG capsule Take 1 capsule (300 mg total) by mouth 2 (two) times daily.  insulin aspart (NOVOLOG FLEXPEN) 100 UNIT/ML SOPN FlexPen Inject 1-12 Units into the skin 3 (three) times daily with meals. As needed per sliding scale  Insulin Glargine (TOUJEO SOLOSTAR) 300 UNIT/ML SOPN Inject 16 Units into the skin at bedtime.  ipratropium-albuterol (DUONEB) 0.5-2.5 (3) MG/3ML SOLN Take 3 mLs by nebulization 2 (two) times daily as needed (for  shortnes of breath).   irbesartan (AVAPRO) 300 MG tablet Take 1 tablet (300 mg total) by mouth at bedtime.  Multiple Vitamins-Minerals (WOMENS 50+ MULTI VITAMIN/MIN) TABS Take 1 tablet by mouth daily.  nitroGLYCERIN (NITROSTAT) 0.4 MG SL tablet Place 0.4 mg under the tongue every 5 (five) minutes as needed. For chest pain  oxyCODONE-acetaminophen (PERCOCET/ROXICET) 5-325 MG per tablet Take 1 tablet by mouth every 6 (six) hours as needed for moderate pain.   simvastatin (ZOCOR) 40 MG tablet Take 40 mg by mouth every evening.   furosemide (LASIX) 80 MG tablet Take 1 tablet (80 mg total) by mouth 2 (two) times daily  as needed (take 1 tablet twice a day for 3 days - then prn). Patient not taking: Reported on 01/06/2015  glucose ([employer] GLUCOSE) 4 GM chewable tablet Chew 1 tablet by mouth as needed for low blood sugar.  loperamide (IMODIUM A-D) 2 MG tablet Take 1 tablet (2 mg total) by mouth 3 (three) times daily as needed for diarrhea or loose stools. Patient not taking: Reported on 01/06/2015  OXYGEN-HELIUM IN Inhale 4 L into the lungs at bedtime.   starch (ANUSOL) 51 % suppository Place 1 suppository rectally as needed for pain.  traMADol (ULTRAM) 50 MG tablet Take 1 tablet (50 mg total) by mouth every 6 (six) hours as needed.  Patient not taking: Reported on 01/06/2015   Allergies:  Allergies  Allergen Reactions  . Aldactone [Spironolactone] Other (See Comments)    Severe hyperkalemia   . Lisinopril Other (See Comments) and Cough    Hypotension also  . Crestor [Rosuvastatin Calcium] Other (See Comments)    Muscle Pain  . Vicodin [Hydrocodone-Acetaminophen] Nausea And Vomiting    History   Social History  . Marital Status: Single    Spouse Name: N/A  . Number of Children: 3  . Years of Education: N/A   Occupational History  . Retired    Social History Main Topics  . Smoking status: Former Smoker -- 1.00 packs/day for 50 years    Types: Cigarettes    Quit date: 01/24/2011  .  Smokeless tobacco: Never Used  . Alcohol Use: No  . Drug Use: No  . Sexual Activity: No   Other Topics Concern  . Not on file   Social History Narrative   Retired Advertising account executive.  Worked at the [employer] on [employer].  Formerly lived in [place] area.  2 daughters, Living here with her daughter in [place] starting end of January 2013. [name] [phone] [id number]. Ambulatory previously with a cane, but none now.    Family History  Problem Relation Age of Onset  . Hyperlipidemia Mother   . Other Mother     AAA  . Alzheimer's disease Mother   . Heart disease Mother   . Irregular heart beat Mother   . Diabetes Daughter   . Hypertension Daughter    Family Status  Relation Status Death Age  . Mother Alive   . Father Deceased   . Maternal Grandmother Deceased   . Maternal Grandfather Deceased   . Paternal Grandmother Deceased   . Paternal Grandfather Deceased     Review of Systems:   Full 14-point review of systems otherwise negative except as noted above.  Physical Exam: Blood pressure 204/91, pulse 78, resp. rate 18, height  (1.575 m), weight 172 lb (78.019 kg), SpO2 99 %. General: Well developed, well nourished,female in no acute distress. Head: Normocephalic, atraumatic, sclera non-icteric, no xanthomas, nares are without discharge. Dentition: good. Neck: No carotid bruits. JVD not seen as elevated, difficult to assess 2nd equipment  Lungs: Good expansion bilaterally. without wheezes. Rhonchi audible throughout all lobes posteriorly.  Heart: Regular rate and rhythm with S1 S2.  No S3 or S4.  No murmur, no rubs, or gallops appreciated. Abdomen: Soft, non-tender, distended with normoactive bowel sounds. No hepatomegaly. No rebound/guarding. No obvious abdominal masses. Msk:  Strength and tone appear normal for age. No joint deformities or effusions. Extremities: No clubbing or cyanosis. 2-3+ edema to bilateral lower extremites.  Distal pedal pulses are 1+, radial  2+ bilaterally.   Neuro: Alert and oriented X 3.  Moves all extremities spontaneously. No focal deficits noted. Psych:  Responds to questions appropriately with a normal affect. Skin: No rashes or lesions noted  Labs:   Lab Results  Component Value Date   WBC 6.3 01/06/2015   HGB 9.6* 01/06/2015   HCT 30.4* 01/06/2015   MCV 95.6 01/06/2015   PLT 165 01/06/2015     Recent Labs Lab 01/06/15 0520  NA 143  K 4.1  CL 107  CO2 26  BUN 33*  CREATININE 1.39*  CALCIUM 8.6  GLUCOSE 248*    Recent Labs  01/06/15 0833  TROPONINI 0.07*    Recent Labs  01/06/15 0527  TROPIPOC 0.09*   Lab Results  Component Value Date   CHOL 247* 11/27/2011   HDL 37* 11/27/2011   LDLCALC 171* 11/27/2011   TRIG 194* 11/27/2011   B NATRIURETIC PEPTIDE  Date/Time Value Ref Range Status  01/06/2015 05:20 AM 1061.8* 0.0 - 100.0 pg/mL Final    Radiology/Studies: Dg Chest Port 1 View 01/06/2015   CLINICAL DATA:  Dyspnea and chest pain  EXAM: PORTABLE CHEST - 1 VIEW  COMPARISON:  08/11/2013  FINDINGS: There is prominent cardiomegaly, unchanged. There is prior sternotomy. There is a tracheostomy appliance in the midline. There is extensive ground-glass opacity in the central and basilar regions which could represent alveolar edema. Moderate vascular and interstitial prominence is present. The findings may represent congestive heart failure. No large effusions are evident.  IMPRESSION: Marked cardiomegaly.  Probable congestive heart failure.   Electronically Signed   By: Ellery Plunkaniel R Mitchell M.D.   On: 01/06/2015 05:58   :  ECG: SR, PVCs, LVH  ASSESSMENT AND PLAN:    Principal Problem:   Acute on chronic combined systolic and diastolic congestive heart failure, NYHA class 4 - EF 20-25% by echo on 10/2012 with grade 2 dist dysf. - Noted to be in volume overload on exam - BNP: 1061.8  - Lasix 40 mg IV BID - Foley cath - daily weights - strict I/O - follow renal function, electrolytes - recheck  echo  Active Problems:   Diabetes mellitus type 2, uncontrolled - home rx + SSI - Ck A1c    Hypertension - continue home rx - follow with diuresis    COPD (chronic obstructive pulmonary disease) - trach is permanent - has O2 qhs at home & with exertion - has prn nebs     CKD Stage 3 - follow with HD    Chronic pain issues - continue home rx  Melida QuitterSigned, Rhonda Barrett, PA-C 01/06/2015 11:25 AM Beeper [phone]   Patient seen and examined. Agree with assessment and plan.  [name] is a 69 year old female who is approximate 20 years status post CBG revascularization surgery 2. [place], [place].  She has peripheral vascular disease and is status post left external iliac to posterior tibial artery bypass surgery.  There is a history of remote CVA, COPD, GERD, hypertension, hyperlipidemia, and she has a permanent tracheostomy which was placed in November 2014.  She has been in [place].  Since 2013, and follows cardiology wise with Dr. Chales Abrahamsskeins.  She states in 2013.  She had a nuclear perfusion study.  Recently, she has experienced increasing episodes of leg swelling and increasing shortness of breath.  She presents with acute CHF, probably combined and has significant lower extremity edema and CHF noted on exam and chest x-ray.  BNP is elevated at 1061.  Her previous echo Doppler study in 2014 showed an ejection fraction at approximately  25% with grade 2 diastolic dysfunction.  She has experienced some episodes of chest tightness associated with this shortness of breath, but believed shortness of breath came first and was not secondary to chest pain development.  Will admit.  Will initiate IV diuresis.  Recommend follow-up 2-D echo Doppler study to reassess systolic and diastolic function.  She has been on ARB therapy.  Her lipids in the past were markedly elevated.  Repeat assessment will be necessary with target LDL less than 70.  She needs improved diabetic management.  She may  ultimately require right and left heart cardiac catheterization.  Following stabilization, particular since it is been over 20 years since her CABG revascularization surgery with high likelihood of CAD progression/graft stenosis or occlusion.  Lennette Bihari, MD, [facility] 01/06/2015 12:23 PM

## 2015-01-07 DIAGNOSIS — R0600 Dyspnea, unspecified: Secondary | ICD-10-CM

## 2015-01-07 LAB — COMPREHENSIVE METABOLIC PANEL
ALT: 18 U/L (ref 0–35)
ANION GAP: 7 (ref 5–15)
AST: 13 U/L (ref 0–37)
Albumin: 3 g/dL — ABNORMAL LOW (ref 3.5–5.2)
Alkaline Phosphatase: 73 U/L (ref 39–117)
BUN: 25 mg/dL — AB (ref 6–23)
CALCIUM: 8.5 mg/dL (ref 8.4–10.5)
CO2: 33 mmol/L — ABNORMAL HIGH (ref 19–32)
Chloride: 102 mmol/L (ref 96–112)
Creatinine, Ser: 1.26 mg/dL — ABNORMAL HIGH (ref 0.50–1.10)
GFR, EST AFRICAN AMERICAN: 50 mL/min — AB (ref >90)
GFR, EST NON AFRICAN AMERICAN: 43 mL/min — AB (ref >90)
GLUCOSE: 118 mg/dL — AB (ref 70–99)
Potassium: 3.6 mmol/L (ref 3.5–5.1)
Sodium: 142 mmol/L (ref 135–145)
Total Bilirubin: 0.8 mg/dL (ref 0.3–1.2)
Total Protein: 5.9 g/dL — ABNORMAL LOW (ref 6.0–8.3)

## 2015-01-07 LAB — GLUCOSE, CAPILLARY
GLUCOSE-CAPILLARY: 222 mg/dL — AB (ref 70–99)
GLUCOSE-CAPILLARY: 201 mg/dL — AB (ref 70–99)
Glucose-Capillary: 113 mg/dL — ABNORMAL HIGH (ref 70–99)

## 2015-01-07 LAB — LIPID PANEL
Cholesterol: 134 mg/dL (ref 0–200)
HDL: 36 mg/dL — AB (ref >39)
LDL Cholesterol: 74 mg/dL (ref 0–99)
TRIGLYCERIDES: 122 mg/dL (ref <150)
Total CHOL/HDL Ratio: 3.7 RATIO
VLDL: 24 mg/dL (ref 0–40)

## 2015-01-07 LAB — TROPONIN I
TROPONIN I: 0.08 ng/mL — AB (ref <0.031)
Troponin I: 0.08 ng/mL — ABNORMAL HIGH (ref <0.031)

## 2015-01-07 MED ORDER — CHLORHEXIDINE GLUCONATE 0.12 % MT SOLN
15.0000 mL | Freq: Two times a day (BID) | OROMUCOSAL | Status: DC
  Administered 2015-01-07 – 2015-01-13 (×12): 15 mL via OROMUCOSAL
  Filled 2015-01-07 (×17): qty 15

## 2015-01-07 MED ORDER — CETYLPYRIDINIUM CHLORIDE 0.05 % MT LIQD
7.0000 mL | Freq: Two times a day (BID) | OROMUCOSAL | Status: DC
  Administered 2015-01-07 – 2015-01-12 (×11): 7 mL via OROMUCOSAL

## 2015-01-07 NOTE — Care Management Note (Unsigned)
    Page 1 of 1   01/12/2015     4:36:24 PM CARE MANAGEMENT NOTE 01/12/2015  Patient:  [name],[name]   Account Number:  [id number]  Date Initiated:  01/07/2015  Documentation initiated by:  Darvin Dials  Subjective/Objective Assessment:   Pt adm on 01/06/15 with CHF, CP, HTN.  PTA, pt resides at home with daughter. She has a chronic trach, which she cares for.     Action/Plan:   Will follow for dc needs as pt progresses.   Anticipated DC Date:  01/13/2015   Anticipated DC Plan:  HOME W HOME HEALTH SERVICES      DC Planning Services  CM consult      Choice offered to / List presented to:             Status of service:  In process, will continue to follow Medicare Important Message given?  YES (If response is "NO", the following Medicare IM given date fields will be blank) Date Medicare IM given:  01/12/2015 Medicare IM given by:  Chamara Dyck Date Additional Medicare IM given:   Additional Medicare IM given by:    Discharge Disposition:    Per UR Regulation:  Reviewed for med. necessity/level of care/duration of stay  If discussed at Long Length of Stay Meetings, dates discussed:    Comments:

## 2015-01-07 NOTE — Consult Note (Signed)
Patient was previously [facility] with [facility] for chronic disease management services. Pat agreed to restart services for the focused on disease management and community resource support.  Patient will receive a post discharge transition of care call and will be evaluated for monthly home visits for assessments. New consent form signed and copy of [facility] information given.  Made Inpatient Case Manager aware that [facility] following. Of note, [facility] services does not replace or interfere with any services that are arranged by inpatient case management or social work.  For additional questions or referrals please contact: Charlesetta ShanksVictoria Elvis Boot, RN BSN CCM [facility] Liaison  [phone] business mobile phone

## 2015-01-07 NOTE — Progress Notes (Signed)
  Echocardiogram 2D Echocardiogram has been performed.  Aris EvertsRix, Geneive Sandstrom A 01/07/2015, 10:43 AM

## 2015-01-07 NOTE — Progress Notes (Signed)
Troponin 0.08, increase from 0.07. No chest pain currently. Cardiology on call Dr. Gala RomneyBensimhon notified.

## 2015-01-07 NOTE — Progress Notes (Signed)
Pt desaturating  On room air with Mauir piece in place . Apparent distress ,. Mauir removed Placed on Tracheostomy collar @ 28%. Saturates on 96%. No desaturations thereafter even/ mauir piece on while eating

## 2015-01-07 NOTE — Progress Notes (Signed)
I cosign all documentation and medication administration for this shift by Komicia S Jefferies, Student RN. 

## 2015-01-07 NOTE — Progress Notes (Signed)
Patient Name: Scot JunDorothy I Lorek Date of Encounter: 01/07/2015     Principal Problem:   Acute on chronic combined systolic and diastolic congestive heart failure, NYHA class 4 Active Problems:   Diabetes mellitus type 2, uncontrolled   Hypertension   COPD (chronic obstructive pulmonary disease)    SUBJECTIVE  Patient with known coronary artery disease and remote history of CABG approximately 20 years ago in [place].  She is admitted with shortness of breath and chest discomfort.  Her troponins are minimally elevated in a plateau pattern.  She has severe beer systolic dysfunction by prior echo in 2014 with an ejection fraction of 20-25%.  She has a history of cerebrovascular disease with prior strokes in 2013.  She had a left carotid endarterectomy in about 2008  She has a history of COPD.  She has a history of a permanent tracheostomy done in 2014 secondary to scarring from a breathing tube that was inserted elsewhere for pneumonia in 2012.  She has been on home oxygen at night and when she leaves her house.  She has a history of peripheral arterial occlusive disease bilaterally.  She is a diabetic.  She came to the hospital yesterday after having chest discomfort and increased shortness of breath at home.  Her EKG in the emergency room showed no evidence of STEMI.  Initial troponin was 0.09. She has noticed recent increased swelling of her lower extremities and her abdomen. She was evaluated in December 2015 for GI hemorrhage.  A nuclear medicine gastrointestinal bleeding scan on 10/22/14 showed no source of hemorrhage.  She remains on dual antiplatelet therapy.  CURRENT MEDS . antiseptic oral rinse  7 mL Mouth Rinse q12n4p  . aspirin EC  81 mg Oral Daily  . carvedilol  25 mg Oral BID WC  . chlorhexidine  15 mL Mouth Rinse BID  . clopidogrel  75 mg Oral Daily  . DULoxetine  60 mg Oral Daily  . enoxaparin (LOVENOX) injection  40 mg Subcutaneous Q24H  . ferrous sulfate  325 mg Oral BID WC   . furosemide  40 mg Intravenous BID  . gabapentin  300 mg Oral BID  . insulin aspart  0-15 Units Subcutaneous TID WC  . insulin glargine  16 Units Subcutaneous QHS  . irbesartan  300 mg Oral QHS  . multivitamin with minerals  1 tablet Oral Daily  . nitroGLYCERIN  0.5 inch Topical 4 times per day  . simvastatin  40 mg Oral QPM  . sodium chloride  3 mL Intravenous Q12H    OBJECTIVE  Filed Vitals:   01/07/15 0355 01/07/15 0546 01/07/15 0744 01/07/15 0846  BP:  146/49  147/51  Pulse: 67 71 68 73  Temp:  98.1 F (36.7 C)    TempSrc:  Oral    Resp: 18 17 16 16   Height:      Weight:  170 lb 12.8 oz (77.474 kg)    SpO2: 96% 98% 97% 97%    Intake/Output Summary (Last 24 hours) at 01/07/15 1047 Last data filed at 01/07/15 0800  Gross per 24 hour  Intake    530 ml  Output   2000 ml  Net  -1470 ml   Filed Weights   01/06/15 0501 01/06/15 1730 01/07/15 0546  Weight: 172 lb (78.019 kg) 172 lb 9.9 oz (78.3 kg) 170 lb 12.8 oz (77.474 kg)    PHYSICAL EXAM  General: Pleasant, NAD.  Permanent tracheostomy in place Neuro: Alert and oriented X 3. Moves all  extremities spontaneously. Psych: Normal affect. HEENT:  Normal  Neck: Supple without bruits or JVD. Lungs:  Resp regular and unlabored, mild basilar rhonchi Heart: RRR no s3, s4, or murmurs. Abdomen: Soft, non-tender, non-distended, BS + x 4.  Extremities: Mild peripheral edema.  Decreased pedal pulses.  Accessory Clinical Findings  CBC  Recent Labs  01/06/15 0520  WBC 6.3  HGB 9.6*  HCT 30.4*  MCV 95.6  PLT 165   Basic Metabolic Panel  Recent Labs  01/06/15 0520 01/07/15 0543  NA 143 142  K 4.1 3.6  CL 107 102  CO2 26 33*  GLUCOSE 248* 118*  BUN 33* 25*  CREATININE 1.39* 1.26*  CALCIUM 8.6 8.5   Liver Function Tests  Recent Labs  01/07/15 0543  AST 13  ALT 18  ALKPHOS 73  BILITOT 0.8  PROT 5.9*  ALBUMIN 3.0*   No results for input(s): LIPASE, AMYLASE in the last 72 hours. Cardiac  Enzymes  Recent Labs  01/06/15 2003 01/06/15 2315 01/07/15 0543  TROPONINI 0.07* 0.08* 0.08*   BNP Invalid input(s): POCBNP D-Dimer No results for input(s): DDIMER in the last 72 hours. Hemoglobin A1C No results for input(s): HGBA1C in the last 72 hours. Fasting Lipid Panel  Recent Labs  01/07/15 0543  CHOL 134  HDL 36*  LDLCALC 74  TRIG 409  CHOLHDL 3.7   Thyroid Function Tests No results for input(s): TSH, T4TOTAL, T3FREE, THYROIDAB in the last 72 hours.  Invalid input(s): FREET3  TELE  Normal sinus rhythm  ECG  06-Jan-2015 05:06:55 [facility] ROUTINE RECORD Sinus rhythm Multiform ventricular premature complexes Borderline prolonged PR interval Probable left atrial enlargement LVH with secondary repolarization abnormality Baseline wander in lead(s) II III aVF  Personally reviewed Radiology/Studies  Dg Chest Port 1 View  01/06/2015   CLINICAL DATA:  Dyspnea and chest pain  EXAM: PORTABLE CHEST - 1 VIEW  COMPARISON:  08/11/2013  FINDINGS: There is prominent cardiomegaly, unchanged. There is prior sternotomy. There is a tracheostomy appliance in the midline. There is extensive ground-glass opacity in the central and basilar regions which could represent alveolar edema. Moderate vascular and interstitial prominence is present. The findings may represent congestive heart failure. No large effusions are evident.  IMPRESSION: Marked cardiomegaly.  Probable congestive heart failure.   Electronically Signed   By: Ellery Plunk M.D.   On: 01/06/2015 05:58    ASSESSMENT AND PLAN 1.  Acute on chronic combined systolic and diastolic congestive heart failure.  Repeat echo done, results pending. 2.  Diabetes mellitus 3.  Hypertensive cardiovascular disease 4.  COPD with permanent tracheostomy 5.  Chronic kidney disease 6.  Severe peripheral vascular disease status post multiple revascularization procedures bilaterally 7.  History of remote stroke.   History of prior left carotid endarterectomy 8.  Ischemic heart disease status post CABG approximately 20 years ago in [place] and with subsequent stents.  Plan: Continue diuresis.  Watch kidney function carefully. I suspect that her initial presenting problem was acute exacerbation of heart failure with secondary chest pain.  We will plan to do a Lexiscan Myoview stress test tomorrow.  We will reserve cardiac catheterization subsequently only if she shows large reversible area of ischemia.  She will be a somewhat difficult vascular access patient for cardiac catheterization because of her previous PAD surgery.  We will obtain lower extremity arterial Doppler studies today to update her ABIs. Signed, Cassell Clement MD

## 2015-01-08 ENCOUNTER — Inpatient Hospital Stay (HOSPITAL_COMMUNITY): Payer: Medicare Other

## 2015-01-08 DIAGNOSIS — R079 Chest pain, unspecified: Secondary | ICD-10-CM

## 2015-01-08 LAB — GLUCOSE, CAPILLARY
GLUCOSE-CAPILLARY: 130 mg/dL — AB (ref 70–99)
Glucose-Capillary: 163 mg/dL — ABNORMAL HIGH (ref 70–99)
Glucose-Capillary: 155 mg/dL — ABNORMAL HIGH (ref 70–99)
Glucose-Capillary: 224 mg/dL — ABNORMAL HIGH (ref 70–99)

## 2015-01-08 LAB — HEMOGLOBIN A1C
Hgb A1c MFr Bld: 9.1 % — ABNORMAL HIGH (ref 4.8–5.6)
Mean Plasma Glucose: 214 mg/dL

## 2015-01-08 LAB — BASIC METABOLIC PANEL
Anion gap: 13 (ref 5–15)
BUN: 25 mg/dL — ABNORMAL HIGH (ref 6–23)
CALCIUM: 8.8 mg/dL (ref 8.4–10.5)
CO2: 27 mmol/L (ref 19–32)
Chloride: 100 mmol/L (ref 96–112)
Creatinine, Ser: 1.18 mg/dL — ABNORMAL HIGH (ref 0.50–1.10)
GFR, EST AFRICAN AMERICAN: 54 mL/min — AB (ref >90)
GFR, EST NON AFRICAN AMERICAN: 46 mL/min — AB (ref >90)
Glucose, Bld: 142 mg/dL — ABNORMAL HIGH (ref 70–99)
Potassium: 3.5 mmol/L (ref 3.5–5.1)
Sodium: 140 mmol/L (ref 135–145)

## 2015-01-08 LAB — CLOSTRIDIUM DIFFICILE BY PCR: Toxigenic C. Difficile by PCR: NEGATIVE

## 2015-01-08 MED ORDER — REGADENOSON 0.4 MG/5ML IV SOLN
INTRAVENOUS | Status: AC
  Filled 2015-01-08: qty 5

## 2015-01-08 MED ORDER — POTASSIUM CHLORIDE CRYS ER 20 MEQ PO TBCR
20.0000 meq | EXTENDED_RELEASE_TABLET | Freq: Two times a day (BID) | ORAL | Status: DC
  Administered 2015-01-08 – 2015-01-11 (×8): 20 meq via ORAL
  Filled 2015-01-08 (×11): qty 1

## 2015-01-08 MED ORDER — ACETAMINOPHEN 325 MG PO TABS
ORAL_TABLET | ORAL | Status: AC
  Filled 2015-01-08: qty 2

## 2015-01-08 MED ORDER — TECHNETIUM TC 99M SESTAMIBI GENERIC - CARDIOLITE
20.0000 | Freq: Once | INTRAVENOUS | Status: AC | PRN
  Administered 2015-01-08: 10 via INTRAVENOUS

## 2015-01-08 MED ORDER — LOPERAMIDE HCL 2 MG PO CAPS
4.0000 mg | ORAL_CAPSULE | ORAL | Status: DC | PRN
  Administered 2015-01-10: 4 mg via ORAL
  Filled 2015-01-08 (×2): qty 2

## 2015-01-08 MED ORDER — REGADENOSON 0.4 MG/5ML IV SOLN
0.4000 mg | Freq: Once | INTRAVENOUS | Status: DC
  Filled 2015-01-08: qty 5

## 2015-01-08 MED ORDER — TECHNETIUM TC 99M SESTAMIBI GENERIC - CARDIOLITE
30.0000 | Freq: Once | INTRAVENOUS | Status: AC | PRN
  Administered 2015-01-08: 30 via INTRAVENOUS

## 2015-01-08 NOTE — Progress Notes (Signed)
Discussed with Dr. Patty SermonsBrackbill regarding abnormal myoview. Will plan for L and R heart cath tomorrow.  Risk and benefit of procedure explained to the patient and her daughter who display clear understanding and agree to proceed.  Discussed with patient possible procedural risk include bleeding, vascular injury, renal injury, arrythmia, MI, stroke and loss of limb or life.  Will hold her PM lasix, add soft IV hydration overnight. NPO past midnight.   Ramond DialSigned, Sanaya Gwilliam PA Pager: [phone]

## 2015-01-08 NOTE — Progress Notes (Signed)
Lexiscan stress test completed. Some ST depression noted in interolateral leads. P[facility] by [facility] reader.  Ramond DialSigned, Ronni Osterberg PA Pager: [phone]

## 2015-01-08 NOTE — Plan of Care (Signed)
Problem: Phase I Progression Outcomes Goal: EF % per last Echo/documented,Core Reminder form on chart Outcome: Completed/Met Date Met:  01/08/15 EF = 50% per ECHO performed on 01/06/15.

## 2015-01-08 NOTE — Trach Care Team (Signed)
Trach Care Progression Note   Patient Details Name: [name] MRN: [id number] DOB: 03/22/1946 Today's Date: 01/08/2015   Tracheostomy Assessment    Tracheostomy Shiley 6 mm Uncuffed (Active)  Status Secured 01/08/2015  7:30 AM  Site Assessment Clean;Dry 01/08/2015  7:30 AM  Site Care Other (Comment) 01/08/2015 12:00 AM  Inner Cannula Care Other (Comment) 01/08/2015 12:00 AM  Ties Assessment Clean;Dry;Secure 01/08/2015  7:30 AM  Cuff pressure (cm) 0 cm 01/08/2015 12:00 AM  Emergency Equipment at bedside Yes 01/08/2015  7:30 AM     Tracheostomy Shiley 6 mm Uncuffed (Active)  Status Secured;Passy Muir Speaking valve 01/08/2015 11:15 AM  Site Assessment Clean;Dry 01/08/2015 11:15 AM  Site Care Other (Comment) 01/08/2015  3:41 AM  Inner Cannula Care Other (Comment) 01/08/2015  3:41 AM  Ties Assessment Clean;Dry;Secure 01/08/2015 11:15 AM  Cuff pressure (cm) 0 cm 01/08/2015  7:30 AM  Emergency Equipment at bedside Yes 01/08/2015  9:00 AM     Care Needs     Respiratory Therapy Tracheostomy: Chronic trach O2 Device: Tracheostomy Collar FiO2 (%): 28 % SpO2: 92 %    Speech Language Pathology  SLP chart review complete: Patient does not need SLP services at this time (independent with PMSV, RN using appropriately)   Physical Therapy      Occupational Therapy      Nutritional Patient's Current Diet:  (npo)    Case Management/Social Work Living status: Family (document relation) (daughter) Insurance payer: [facility] doctorMedicare    Provider Trach Care Team/Provider Recommendations Trach Care Team Members Present-  Mat CarneJustin Hopper, RT, RoyJenna Holloman, SW, Joaquin CourtsKimberly Harris, RD, Harlon DittyBonnie DeBlois, SLP    None at present. Chronic Trach          Shilee Biggs, Silva BandyDebra Anita (Scribe for Team) 01/08/2015, 2:55 PM

## 2015-01-08 NOTE — Progress Notes (Signed)
Patient Name: Scot JunDorothy I Brandi Date of Encounter: 01/08/2015  Primary Cardiologist: Dr. Anne FuSkains   Principal Problem:   Acute on chronic combined systolic and diastolic congestive heart failure, NYHA class 4 Active Problems:   Diabetes mellitus type 2, uncontrolled   Hypertension   COPD (chronic obstructive pulmonary disease)    SUBJECTIVE  Denies any CP or SOB. Patient seen during stress test  CURRENT MEDS . antiseptic oral rinse  7 mL Mouth Rinse q12n4p  . aspirin EC  81 mg Oral Daily  . carvedilol  25 mg Oral BID WC  . chlorhexidine  15 mL Mouth Rinse BID  . clopidogrel  75 mg Oral Daily  . DULoxetine  60 mg Oral Daily  . enoxaparin (LOVENOX) injection  40 mg Subcutaneous Q24H  . ferrous sulfate  325 mg Oral BID WC  . furosemide  40 mg Intravenous BID  . gabapentin  300 mg Oral BID  . insulin aspart  0-15 Units Subcutaneous TID WC  . insulin glargine  16 Units Subcutaneous QHS  . irbesartan  300 mg Oral QHS  . multivitamin with minerals  1 tablet Oral Daily  . nitroGLYCERIN  0.5 inch Topical 4 times per day  . regadenoson      . regadenoson  0.4 mg Intravenous Once  . simvastatin  40 mg Oral QPM  . sodium chloride  3 mL Intravenous Q12H    OBJECTIVE  Filed Vitals:   01/08/15 0702 01/08/15 0900 01/08/15 0915 01/08/15 0943  BP: 119/62   154/78  Pulse: 69 67  73  Temp: 98 F (36.7 C)     TempSrc: Oral     Resp: 18 22    Height:      Weight: 170 lb 4.2 oz (77.23 kg)     SpO2: 100% 100% 89% 90%    Intake/Output Summary (Last 24 hours) at 01/08/15 0954 Last data filed at 01/08/15 0310  Gross per 24 hour  Intake    680 ml  Output    625 ml  Net     55 ml   Filed Weights   01/06/15 1730 01/07/15 0546 01/08/15 0702  Weight: 172 lb 9.9 oz (78.3 kg) 170 lb 12.8 oz (77.474 kg) 170 lb 4.2 oz (77.23 kg)    PHYSICAL EXAM  General: Pleasant, NAD. Neuro: Alert and oriented X 3. Moves all extremities spontaneously. Psych: Normal affect. HEENT:  Normal  Neck:  Supple without bruits or JVD. Lungs:  Resp regular and unlabored. anterior exam decreased breath sound in L lung Heart: RRR no s3, s4, or murmurs. Abdomen: Soft, non-tender, non-distended, BS + x 4.  Extremities: No clubbing, cyanosis. +1 pitting edema in LE. DP/PT/Radials 2+ and equal bilaterally.   Accessory Clinical Findings  CBC  Recent Labs  01/06/15 0520  WBC 6.3  HGB 9.6*  HCT 30.4*  MCV 95.6  PLT 165   Basic Metabolic Panel  Recent Labs  01/07/15 0543 01/08/15 0547  NA 142 140  K 3.6 3.5  CL 102 100  CO2 33* 27  GLUCOSE 118* 142*  BUN 25* 25*  CREATININE 1.26* 1.18*  CALCIUM 8.5 8.8   Liver Function Tests  Recent Labs  01/07/15 0543  AST 13  ALT 18  ALKPHOS 73  BILITOT 0.8  PROT 5.9*  ALBUMIN 3.0*   Cardiac Enzymes  Recent Labs  01/06/15 2003 01/06/15 2315 01/07/15 0543  TROPONINI 0.07* 0.08* 0.08*   Hemoglobin A1C  Recent Labs  01/06/15 2003  HGBA1C 9.1*  Fasting Lipid Panel  Recent Labs  01/07/15 0543  CHOL 134  HDL 36*  LDLCALC 74  TRIG 161  CHOLHDL 3.7    TELE NSR with HR 60-80s    ECG  No new EKG  Echocardiogram 01/07/2015  LV EF: 50%  ------------------------------------------------------------------- Indications:   Dyspnea 786.09.  ------------------------------------------------------------------- History:  PMH: CABG. Congestive heart failure. Transient ischemic attack. Stroke. Chronic obstructive pulmonary disease. Risk factors: Hypertension. Diabetes mellitus.  ------------------------------------------------------------------- Study Conclusions  - Left ventricle: Hypokinesis inferolateral wall and base inferior wall. The cavity size was normal. Wall thickness was increased in a pattern of moderate LVH. The estimated ejection fraction was 50%. Doppler parameters are consistent with high ventricular filling pressure. - Aortic valve: Sclerosis without stenosis. There was  trivial regurgitation. - Left atrium: The atrium was mildly dilated. - Right ventricle: The cavity size was mildly dilated. Systolic function was mildly to moderately reduced. - Right atrium: The atrium was mildly dilated. - Pulmonary arteries: PA peak pressure: 35 mm Hg (S).    Radiology/Studies  Dg Chest Port 1 View  01/06/2015   CLINICAL DATA:  Dyspnea and chest pain  EXAM: PORTABLE CHEST - 1 VIEW  COMPARISON:  08/11/2013  FINDINGS: There is prominent cardiomegaly, unchanged. There is prior sternotomy. There is a tracheostomy appliance in the midline. There is extensive ground-glass opacity in the central and basilar regions which could represent alveolar edema. Moderate vascular and interstitial prominence is present. The findings may represent congestive heart failure. No large effusions are evident.  IMPRESSION: Marked cardiomegaly.  Probable congestive heart failure.   Electronically Signed   By: Ellery Plunk M.D.   On: 01/06/2015 05:58    ASSESSMENT AND PLAN  1. Acute on chronic combined systolic and diastolic congestive heart failure.  - Echo 01/07/2015 EF 50%, mild to moderately reduced RVEF, PA peak pressure  - still fluid overloaded, continue IV lasix  2. Diabetes mellitus 3. Hypertensive cardiovascular disease 4. COPD with permanent tracheostomy 5. Chronic kidney disease 6. Severe peripheral vascular disease status post multiple revascularization procedures bilaterally 7. History of remote stroke. History of prior left carotid endarterectomy 8. Ischemic heart disease status post CABG approximately 20 years ago in [place] and with subsequent stents.  - pending myoview today, per Dr. Patty Sermons, would only consider cath if shows large reversible area of ischemia. Likely difficult to access given h/o PAD  - pending ABI  Signed, Amedeo Plenty Pager: [phone] Patient tolerated the Myoview okay.  Mild dyspnea and very minimal chest tightness and mild  headache.  We will await results of Myoview study.  As above, consider cath only if it shows a large area of reversible ischemia. Her breathing is improved.  She still has a few crackling rales at bases.  Continue IV Lasix.  Renal function is stable.  Potassium borderline low.  We'll add potassium supplementation.

## 2015-01-09 ENCOUNTER — Encounter (HOSPITAL_COMMUNITY)
Admission: EM | Disposition: A | Discharge status: Home / Self Care | Payer: Self-pay | Source: Home / Self Care | Attending: Cardiovascular Disease

## 2015-01-09 ENCOUNTER — Encounter (HOSPITAL_COMMUNITY): Payer: Self-pay | Admitting: Cardiology

## 2015-01-09 DIAGNOSIS — R9439 Abnormal result of other cardiovascular function study: Secondary | ICD-10-CM | POA: Insufficient documentation

## 2015-01-09 DIAGNOSIS — R079 Chest pain, unspecified: Secondary | ICD-10-CM | POA: Insufficient documentation

## 2015-01-09 DIAGNOSIS — J810 Acute pulmonary edema: Secondary | ICD-10-CM | POA: Insufficient documentation

## 2015-01-09 DIAGNOSIS — I2510 Atherosclerotic heart disease of native coronary artery without angina pectoris: Secondary | ICD-10-CM

## 2015-01-09 HISTORY — PX: LEFT AND RIGHT HEART CATHETERIZATION WITH CORONARY ANGIOGRAM: SHX5449

## 2015-01-09 LAB — POCT I-STAT 3, VENOUS BLOOD GAS (G3P V)
Acid-Base Excess: 2 mmol/L (ref 0.0–2.0)
Acid-Base Excess: 3 mmol/L — ABNORMAL HIGH (ref 0.0–2.0)
Acid-Base Excess: 3 mmol/L — ABNORMAL HIGH (ref 0.0–2.0)
Bicarbonate: 28.4 mEq/L — ABNORMAL HIGH (ref 20.0–24.0)
Bicarbonate: 30.7 mEq/L — ABNORMAL HIGH (ref 20.0–24.0)
Bicarbonate: 30.5 mEq/L — ABNORMAL HIGH (ref 20.0–24.0)
O2 SAT: 83 %
O2 SAT: 60 %
O2 Saturation: 59 %
PCO2 VEN: 52 mmHg — AB (ref 45.0–50.0)
PCO2 VEN: 61.6 mmHg — AB (ref 45.0–50.0)
PH VEN: 7.309 — AB (ref 7.250–7.300)
PH VEN: 7.346 — AB (ref 7.250–7.300)
PO2 VEN: 35 mmHg (ref 30.0–45.0)
TCO2: 30 mmol/L (ref 0–100)
TCO2: 32 mmol/L (ref 0–100)
TCO2: 32 mmol/L (ref 0–100)
pCO2, Ven: 61.1 mmHg — ABNORMAL HIGH (ref 45.0–50.0)
pH, Ven: 7.303 — ABNORMAL HIGH (ref 7.250–7.300)
pO2, Ven: 35 mmHg (ref 30.0–45.0)
pO2, Ven: 52 mmHg — ABNORMAL HIGH (ref 30.0–45.0)

## 2015-01-09 LAB — POCT I-STAT 3, ART BLOOD GAS (G3+)
ACID-BASE EXCESS: 3 mmol/L — AB (ref 0.0–2.0)
BICARBONATE: 29.8 meq/L — AB (ref 20.0–24.0)
O2 SAT: 94 %
TCO2: 31 mmol/L (ref 0–100)
pCO2 arterial: 56.3 mmHg — ABNORMAL HIGH (ref 35.0–45.0)
pH, Arterial: 7.331 — ABNORMAL LOW (ref 7.350–7.450)
pO2, Arterial: 77 mmHg — ABNORMAL LOW (ref 80.0–100.0)

## 2015-01-09 LAB — BASIC METABOLIC PANEL
ANION GAP: 7 (ref 5–15)
BUN: 24 mg/dL — AB (ref 6–23)
CALCIUM: 8.3 mg/dL — AB (ref 8.4–10.5)
CO2: 32 mmol/L (ref 19–32)
CREATININE: 1.21 mg/dL — AB (ref 0.50–1.10)
Chloride: 99 mmol/L (ref 96–112)
GFR, EST AFRICAN AMERICAN: 52 mL/min — AB (ref >90)
GFR, EST NON AFRICAN AMERICAN: 45 mL/min — AB (ref >90)
Glucose, Bld: 265 mg/dL — ABNORMAL HIGH (ref 70–99)
Potassium: 3.6 mmol/L (ref 3.5–5.1)
Sodium: 138 mmol/L (ref 135–145)

## 2015-01-09 LAB — CBC
HEMATOCRIT: 34.1 % — AB (ref 36.0–46.0)
Hemoglobin: 10.4 g/dL — ABNORMAL LOW (ref 12.0–15.0)
MCH: 28.5 pg (ref 26.0–34.0)
MCHC: 30.5 g/dL (ref 30.0–36.0)
MCV: 93.4 fL (ref 78.0–100.0)
PLATELETS: 197 10*3/uL (ref 150–400)
RBC: 3.65 MIL/uL — ABNORMAL LOW (ref 3.87–5.11)
RDW: 16.6 % — AB (ref 11.5–15.5)
WBC: 4.4 10*3/uL (ref 4.0–10.5)

## 2015-01-09 LAB — GLUCOSE, CAPILLARY
GLUCOSE-CAPILLARY: 225 mg/dL — AB (ref 70–99)
Glucose-Capillary: 200 mg/dL — ABNORMAL HIGH (ref 70–99)
Glucose-Capillary: 198 mg/dL — ABNORMAL HIGH (ref 70–99)
Glucose-Capillary: 268 mg/dL — ABNORMAL HIGH (ref 70–99)
Glucose-Capillary: 117 mg/dL — ABNORMAL HIGH (ref 70–99)

## 2015-01-09 LAB — CREATININE, SERUM
Creatinine, Ser: 1.19 mg/dL — ABNORMAL HIGH (ref 0.50–1.10)
GFR calc non Af Amer: 46 mL/min — ABNORMAL LOW (ref >90)
GFR, EST AFRICAN AMERICAN: 53 mL/min — AB (ref >90)

## 2015-01-09 SURGERY — LEFT AND RIGHT HEART CATHETERIZATION WITH CORONARY ANGIOGRAM
Anesthesia: LOCAL

## 2015-01-09 MED ORDER — LIDOCAINE HCL (PF) 1 % IJ SOLN
INTRAMUSCULAR | Status: AC
  Filled 2015-01-09: qty 30

## 2015-01-09 MED ORDER — SODIUM CHLORIDE 0.9 % IJ SOLN: 3.0000 mL | INTRAMUSCULAR | Status: DC | PRN

## 2015-01-09 MED ORDER — SODIUM CHLORIDE 0.9 % IV SOLN
250.0000 mL | INTRAVENOUS | Status: DC | PRN
  Administered 2015-01-09: 250 mL via INTRAVENOUS

## 2015-01-09 MED ORDER — SODIUM CHLORIDE 0.9 % IV SOLN
1.0000 mL/kg/h | INTRAVENOUS | Status: AC
  Administered 2015-01-09: 1 mL/kg/h via INTRAVENOUS

## 2015-01-09 MED ORDER — MIDAZOLAM HCL 2 MG/2ML IJ SOLN
INTRAMUSCULAR | Status: AC
  Filled 2015-01-09: qty 2

## 2015-01-09 MED ORDER — HEPARIN (PORCINE) IN NACL 2-0.9 UNIT/ML-% IJ SOLN
INTRAMUSCULAR | Status: AC
  Filled 2015-01-09: qty 1000

## 2015-01-09 MED ORDER — INSULIN ASPART 100 UNIT/ML ~~LOC~~ SOLN
SUBCUTANEOUS | Status: AC
  Filled 2015-01-09: qty 1

## 2015-01-09 MED ORDER — ASPIRIN 81 MG PO CHEW
81.0000 mg | CHEWABLE_TABLET | ORAL | Status: AC
  Administered 2015-01-09: 81 mg via ORAL
  Filled 2015-01-09: qty 1

## 2015-01-09 MED ORDER — HYDRALAZINE HCL 20 MG/ML IJ SOLN
INTRAMUSCULAR | Status: AC
  Filled 2015-01-09: qty 1

## 2015-01-09 MED ORDER — SODIUM CHLORIDE 0.9 % IV SOLN
Freq: Once | INTRAVENOUS | Status: AC
  Administered 2015-01-09: 03:00:00 via INTRAVENOUS

## 2015-01-09 MED ORDER — SODIUM CHLORIDE 0.9 % IJ SOLN: 3.0000 mL | Freq: Two times a day (BID) | INTRAMUSCULAR | Status: DC

## 2015-01-09 MED ORDER — LIVING WELL WITH DIABETES BOOK
Freq: Once | Status: AC
  Administered 2015-01-09: 16:00:00
  Filled 2015-01-09: qty 1

## 2015-01-09 MED ORDER — FENTANYL CITRATE 0.05 MG/ML IJ SOLN
INTRAMUSCULAR | Status: AC
  Filled 2015-01-09: qty 2

## 2015-01-09 NOTE — Interval H&P Note (Signed)
History and Physical Interval Note:  01/09/2015 7:49 AM  [name]  has presented today for surgery, with the diagnosis of abnormal nuclear stress test, chf. Acute combined systolic & Diastolic HF:  High risk nuclear stress test. LVEF 25%, dilated LV with severe global hypokinesis and anteroapical akinesis. There is a moderate-sized and intensity, partially reversible anterior perfusion defect which could represent scar with peri-infarct ischemia or significant hybernating myocardium.    The various methods of treatment have been discussed with the patient and family. After consideration of risks, benefits and other options for treatment, the patient has consented to  Procedure(s): LEFT AND RIGHT HEART CATHETERIZATION WITH CORONARY ANGIOGRAM (N/A) +/- PCI as a surgical intervention .  The patient's history has been reviewed, patient examined, no change in status, stable for surgery.  I have reviewed the patient's chart and labs.  Questions were answered to the patient's satisfaction.     HARDING, DAVID W  Cath Lab Visit (complete for each Cath Lab visit)  Clinical Evaluation Leading to the Procedure:   ACS: No.  Non-ACS:    Anginal Classification: CCS IV  Anti-ischemic medical therapy: Minimal Therapy (1 class of medications)  Non-Invasive Test Results: High-risk stress test findings: cardiac mortality >3%/year  Prior CABG: Prior CABG

## 2015-01-09 NOTE — Progress Notes (Signed)
I cosign all documentation and medication administration for this shift by Komicia S Jefferies, Student RN. 

## 2015-01-09 NOTE — Progress Notes (Signed)
Patient Name: [name] Date of Encounter: 01/09/2015     Principal Problem:   Acute on chronic combined systolic and diastolic congestive heart failure, NYHA class 4 Active Problems:   Diabetes mellitus type 2, uncontrolled   Hypertension   COPD (chronic obstructive pulmonary disease)   Acute pulmonary edema   Pain in the chest   Abnormal nuclear stress test    SUBJECTIVE  Doing well post cath.   No chest pain. No lesions amenable to PCI found. Continue medical treatment.  CURRENT MEDS . antiseptic oral rinse  7 mL Mouth Rinse q12n4p  . aspirin EC  81 mg Oral Daily  . carvedilol  25 mg Oral BID WC  . chlorhexidine  15 mL Mouth Rinse BID  . clopidogrel  75 mg Oral Daily  . DULoxetine  60 mg Oral Daily  . enoxaparin (LOVENOX) injection  40 mg Subcutaneous Q24H  . ferrous sulfate  325 mg Oral BID WC  . gabapentin  300 mg Oral BID  . insulin aspart  0-15 Units Subcutaneous TID WC  . insulin glargine  16 Units Subcutaneous QHS  . irbesartan  300 mg Oral QHS  . multivitamin with minerals  1 tablet Oral Daily  . nitroGLYCERIN  0.5 inch Topical 4 times per day  . potassium chloride  20 mEq Oral BID  . regadenoson  0.4 mg Intravenous Once  . simvastatin  40 mg Oral QPM  . sodium chloride  3 mL Intravenous Q12H    OBJECTIVE  Filed Vitals:   01/09/15 1050 01/09/15 1115 01/09/15 1239 01/09/15 1408  BP: 131/62 132/53  92/63  Pulse: 69 75 65 67  Temp:    97.9 F (36.6 C)  TempSrc:    Oral  Resp: Height:      Weight:      SpO2: 99%  96% 97%    Intake/Output Summary (Last 24 hours) at 01/09/15 1425 Last data filed at 01/09/15 1408  Gross per 24 hour  Intake   3550 ml  Output    600 ml  Net   2950 ml   Filed Weights   01/07/15 0546 01/08/15 0702 01/09/15 0626  Weight: 170 lb 12.8 oz (77.474 kg) 170 lb 4.2 oz (77.23 kg) 168 lb 8 oz (76.431 kg)    PHYSICAL EXAM  General: Pleasant, NAD. Neuro: Alert and oriented X 3. Moves all extremities  spontaneously. Psych: Normal affect. HEENT:  Normal  Neck: Supple without bruits or JVD. Lungs:  Resp regular and unlabored, CTA. Heart: RRR no s3, s4, or murmurs. Abdomen: Soft, non-tender, non-distended, BS + x 4.  Extremities: No clubbing, cyanosis. Mild edema  Accessory Clinical Findings  CBC  Recent Labs  01/09/15 1127  WBC 4.4  HGB 10.4*  HCT 34.1*  MCV 93.4  PLT 197   Basic Metabolic Panel  Recent Labs  01/08/15 0547 01/09/15 0441 01/09/15 1127  NA 140 138  --   K 3.5 3.6  --   CL 100 99  --   CO2 27 32  --   GLUCOSE 142* 265*  --   BUN 25* 24*  --   CREATININE 1.18* 1.21* 1.19*  CALCIUM 8.8 8.3*  --    Liver Function Tests  Recent Labs  01/07/15 0543  AST 13  ALT 18  ALKPHOS 73  BILITOT 0.8  PROT 5.9*  ALBUMIN 3.0*   No results for input(s): LIPASE, AMYLASE in the last 72 hours. Cardiac Enzymes  Recent Labs  01/06/15 2003 01/06/15 2315 01/07/15 0543  TROPONINI 0.07* 0.08* 0.08*   BNP Invalid input(s): POCBNP D-Dimer No results for input(s): DDIMER in the last 72 hours. Hemoglobin A1C  Recent Labs  01/06/15 2003  HGBA1C 9.1*   Fasting Lipid Panel  Recent Labs  01/07/15 0543  CHOL 134  HDL 36*  LDLCALC 74  TRIG 161  CHOLHDL 3.7   Thyroid Function Tests No results for input(s): TSH, T4TOTAL, T3FREE, THYROIDAB in the last 72 hours.  Invalid input(s): FREET3       Radiology/Studies  Nm Myocar Multi W/spect W/wall Motion / Ef  01/08/2015   HISTORY OF PRESENT ILLNESS: Nuclear Med Background  Indication for Stress Test:  Chest pain  History: [name] is a 69 y.o. female whom is somewhat of a poor historian; her daughter is at the bedside and able to assist with some questions. She has an extensive past medical history of CAD s/p CABG and stenting done approximately 20 years ago in [place], she does not know which arteries were bypassed; HTN, HLD, DM, PVD, she underwent Left external iliac to posterior tibial artery  bypass using nonreversed ipsilateral greater saphenous vein by Dr. 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 She only requires oxygen at night and when she leaves her home.  She became SOB at rest yesterday around 1800, she has PRN nebulizer treatments at home but she did not do a treatment or increase her oxygen level. The SOB got worse with exertion and around 0300 she developed mid-sternal chest pressure without radiation, the pressure would come and go, she did not take a nitroglycerin. Her daughter, whom she lives with, became concerned and felt it was best to call EMS.  When EMS arrived her chest pain was an 8/10 she was given aspirin and nitro x 2 with full relief of chest pain after second dose. EMS suctioned her trach and retrieved a mucus plug which eased her breathing. Upon arrival to the ED she was much more comfortable with chest pain at a 2/10, 02 sats 92% on room air, heart rate and BP were WNL. Her BNP upon arrival was 1061.8, Chest x-ray showed marked cardiomegaly, and she had an elevated troponin level of 0.09. EKG without signs of a STEMI.  She has been taking all over medications as prescribed but admits she has not been weighing herself daily or following a heart healthy diet. Last known weight was 176 lbs which she says was from an MD appointment 3 months ago. She did notice on Saturday that she was having swelling in her bilateral lower extremities and that her abdomen felt distended. She did have some nausea this a.m. not correlated with the chest pain, no vomiting. Denies any dizziness or weakness. Ambulates at home with a walker and cane.   Cardiac Risk Factors:  As above  Symptoms:  Chest pain  PROCEDURE: Nuclear Pre-Procedure  Caffeine/Decaff Intake:  NPO After: MN.  Lungs:  O2 Stat:  IV 0.9% NS with Angio Cath:  Chest Size (in):  Cup Size:  Height: 5'2"  Weight:  170 lb  BMI:  Tech Comments:  Nuclear Med Study  1 or 2 day study: 1  Stress Test Type: Lexiscan  Reading MD: Hilty  Order Authorizing Provider: Tresa EndoKelly  Resting Radionuclide: Sestamibi  Resting Radionuclide Dose:  10 mCi  Stress Radionuclide: Sestamibi  Stress Radionuclide Dose:  30 mCi  Stress Protocol  Rest HR: 82  Stress HR: 81  Rest BP: 154/78  Stress BP: 121/53  Exercise Time (min):  METS:  Dose of Adenosine (mg):  Dose of Lexiscan:  0.4 mg  Dose of Atropine (mg):  Dose of Dobutamine:  Stress Test Technologist:  Nuclear Technologist:  Rest Procedure:  Stress Procedure:  Transient Ischemic Dilatation (Normal <1.22): 1.15  Lung/Heart Ratio (Normal <0.45):  QGS EDV: 183  QGS ESV: 138  LV Ejection Fraction: 25%  Rest ECG:  NSR with inferior TWI  Stress ECG:  NSR with more significant TWI inferiorly  Raw Data Images: Significant gut uptake, does not interfere with the study  Stress Images: Moderate size and intensity anterior perfusion defect  Rest Images: Moderate size and mild intensity anterior perfusion defect  Subtraction (SDS): 6  IMPRESSION: Exercise Capacity: N/A  BP Response: normal  Clinical Symptoms:  Mild dyspnea and chest pain  ECG Impression:  TWI's and lateral ST changes with lexiscan  Comparison with Prior Nuclear Study: None  Final Impression:  High risk nuclear stress test. LVEF 25%, dilated LV with severe global hypokinesis and anteroapical akinesis. There is a moderate-sized and intensity, partially reversible anterior perfusion defect which could represent scar with peri-infarct ischemia or significant hybernating myocardium.   Electronically Signed   By: Chrystie NoseKenneth C Hilty   On: 01/08/2015 13:49   Dg Chest Port 1 View  01/06/2015   CLINICAL DATA:  Dyspnea and chest pain   EXAM: PORTABLE CHEST - 1 VIEW  COMPARISON:  08/11/2013  FINDINGS: There is prominent cardiomegaly, unchanged. There is prior sternotomy. There is a tracheostomy appliance in the midline. There is extensive ground-glass opacity in the central and basilar regions which could represent alveolar edema. Moderate vascular and interstitial prominence is present. The findings may represent congestive heart failure. No large effusions are evident.  IMPRESSION: Marked cardiomegaly.  Probable congestive heart failure.   Electronically Signed   By: Ellery Plunkaniel R Mitchell M.D.   On: 01/06/2015 05:58    ASSESSMENT AND PLAN  1. Acute on chronic combined systolic and diastolic congestive heart failure. - Echo 01/07/2015 EF 50%, mild to moderately reduced RVEF, PA peak pressure 35mmHg EF 25% on Myoview 01/08/15  2. Diabetes mellitus 3. Hypertensive cardiovascular disease 4. COPD with permanent tracheostomy 5. Chronic kidney disease 6. Severe peripheral vascular disease status post multiple revascularization procedures bilaterally 7. History of remote stroke. History of prior left carotid endarterectomy 8. Ischemic heart disease status post CABG approximately 20 years ago in [place] and with subsequent stents. Cath today revealed:  Severe Native & graft CAD with 100% RCA, LAD & small AV Groove Cx along with SVG-RCA  Widely patent SVG-distal Mid Wraparound LAD with severe diffuse proximal LAD disease limiting retrograde flow.  Widely patent native Ramus vs. Large Lateral OM with 2 large bifurcating branches that have diffuse moderate disease.  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 Restart lasix at discharge. Check BMET  in am. Followup with Dr. Anne FuSkains. Signed, Maisie Fushomas  Jeevan Kalla MD

## 2015-01-09 NOTE — H&P (View-Only) (Signed)
 Patient Name: [name] Date of Encounter: 01/08/2015  Primary Cardiologist: Dr. Skains   Principal Problem:   Acute on chronic combined systolic and diastolic congestive heart failure, NYHA class 4 Active Problems:   Diabetes mellitus type 2, uncontrolled   Hypertension   COPD (chronic obstructive pulmonary disease)    SUBJECTIVE  Denies any CP or SOB. Patient seen during stress test  CURRENT MEDS . antiseptic oral rinse  7 mL Mouth Rinse q12n4p  . aspirin EC  81 mg Oral Daily  . carvedilol  25 mg Oral BID WC  . chlorhexidine  15 mL Mouth Rinse BID  . clopidogrel  75 mg Oral Daily  . DULoxetine  60 mg Oral Daily  . enoxaparin (LOVENOX) injection  40 mg Subcutaneous Q24H  . ferrous sulfate  325 mg Oral BID WC  . furosemide  40 mg Intravenous BID  . gabapentin  300 mg Oral BID  . insulin aspart  0-15 Units Subcutaneous TID WC  . insulin glargine  16 Units Subcutaneous QHS  . irbesartan  300 mg Oral QHS  . multivitamin with minerals  1 tablet Oral Daily  . nitroGLYCERIN  0.5 inch Topical 4 times per day  . regadenoson      . regadenoson  0.4 mg Intravenous Once  . simvastatin  40 mg Oral QPM  . sodium chloride  3 mL Intravenous Q12H    OBJECTIVE  Filed Vitals:   01/08/15 0702 01/08/15 0900 01/08/15 0915 01/08/15 0943  BP: 119/62   154/78  Pulse: 69 67  73  Temp: 98 F (36.7 C)     TempSrc: Oral     Resp: 18 22    Height:      Weight: 170 lb 4.2 oz (77.23 kg)     SpO2: 100% 100% 89% 90%    Intake/Output Summary (Last 24 hours) at 01/08/15 0954 Last data filed at 01/08/15 0310  Gross per 24 hour  Intake    680 ml  Output    625 ml  Net     55 ml   Filed Weights   01/06/15 1730 01/07/15 0546 01/08/15 0702  Weight: 172 lb 9.9 oz (78.3 kg) 170 lb 12.8 oz (77.474 kg) 170 lb 4.2 oz (77.23 kg)    PHYSICAL EXAM  General: Pleasant, NAD. Neuro: Alert and oriented X 3. Moves all extremities spontaneously. Psych: Normal affect. HEENT:  Normal  Neck:  Supple without bruits or JVD. Lungs:  Resp regular and unlabored. anterior exam decreased breath sound in L lung Heart: RRR no s3, s4, or murmurs. Abdomen: Soft, non-tender, non-distended, BS + x 4.  Extremities: No clubbing, cyanosis. +1 pitting edema in LE. DP/PT/Radials 2+ and equal bilaterally.   Accessory Clinical Findings  CBC  Recent Labs  01/06/15 0520  WBC 6.3  HGB 9.6*  HCT 30.4*  MCV 95.6  PLT 165   Basic Metabolic Panel  Recent Labs  01/07/15 0543 01/08/15 0547  NA 142 140  K 3.6 3.5  CL 102 100  CO2 33* 27  GLUCOSE 118* 142*  BUN 25* 25*  CREATININE 1.26* 1.18*  CALCIUM 8.5 8.8   Liver Function Tests  Recent Labs  01/07/15 0543  AST 13  ALT 18  ALKPHOS 73  BILITOT 0.8  PROT 5.9*  ALBUMIN 3.0*   Cardiac Enzymes  Recent Labs  01/06/15 2003 01/06/15 2315 01/07/15 0543  TROPONINI 0.07* 0.08* 0.08*   Hemoglobin A1C  Recent Labs  01/06/15 2003  HGBA1C 9.1*     Fasting Lipid Panel  Recent Labs  01/07/15 0543  CHOL 134  HDL 36*  LDLCALC 74  TRIG 122  CHOLHDL 3.7    TELE NSR with HR 60-80s    ECG  No new EKG  Echocardiogram 01/07/2015  LV EF: 50%  ------------------------------------------------------------------- Indications:   Dyspnea 786.09.  ------------------------------------------------------------------- History:  PMH: CABG. Congestive heart failure. Transient ischemic attack. Stroke. Chronic obstructive pulmonary disease. Risk factors: Hypertension. Diabetes mellitus.  ------------------------------------------------------------------- Study Conclusions  - Left ventricle: Hypokinesis inferolateral wall and base inferior wall. The cavity size was normal. Wall thickness was increased in a pattern of moderate LVH. The estimated ejection fraction was 50%. Doppler parameters are consistent with high ventricular filling pressure. - Aortic valve: Sclerosis without stenosis. There was  trivial regurgitation. - Left atrium: The atrium was mildly dilated. - Right ventricle: The cavity size was mildly dilated. Systolic function was mildly to moderately reduced. - Right atrium: The atrium was mildly dilated. - Pulmonary arteries: PA peak pressure: 35 mm Hg (S).    Radiology/Studies  Dg Chest Port 1 View  01/06/2015   CLINICAL DATA:  Dyspnea and chest pain  EXAM: PORTABLE CHEST - 1 VIEW  COMPARISON:  08/11/2013  FINDINGS: There is prominent cardiomegaly, unchanged. There is prior sternotomy. There is a tracheostomy appliance in the midline. There is extensive ground-glass opacity in the central and basilar regions which could represent alveolar edema. Moderate vascular and interstitial prominence is present. The findings may represent congestive heart failure. No large effusions are evident.  IMPRESSION: Marked cardiomegaly.  Probable congestive heart failure.   Electronically Signed   By: Daniel R Mitchell M.D.   On: 01/06/2015 05:58    ASSESSMENT AND PLAN  1. Acute on chronic combined systolic and diastolic congestive heart failure.  - Echo 01/07/2015 EF 50%, mild to moderately reduced RVEF, PA peak pressure 35mmHg  - still fluid overloaded, continue IV lasix  2. Diabetes mellitus 3. Hypertensive cardiovascular disease 4. COPD with permanent tracheostomy 5. Chronic kidney disease 6. Severe peripheral vascular disease status post multiple revascularization procedures bilaterally 7. History of remote stroke. History of prior left carotid endarterectomy 8. Ischemic heart disease status post CABG approximately 20 years ago in [place] and with subsequent stents.  - pending myoview today, per Dr. Tierany Appleby, would only consider cath if shows large reversible area of ischemia. Likely difficult to access given h/o PAD  - pending ABI  Signed, Meng, Hao PA-C Pager: [phone] Patient tolerated the Myoview okay.  Mild dyspnea and very minimal chest tightness and mild  headache.  We will await results of Myoview study.  As above, consider cath only if it shows a large area of reversible ischemia. Her breathing is improved.  She still has a few crackling rales at bases.  Continue IV Lasix.  Renal function is stable.  Potassium borderline low.  We'll add potassium supplementation. 

## 2015-01-09 NOTE — CV Procedure (Signed)
CARDIAC CATHETERIZATION REPORT  NAME:  [name]   MRN: [id number] DOB:  01-18-1946   ADMIT DATE: 01/06/2015 Procedure Date: 01/09/2015  INTERVENTIONAL CARDIOLOGIST: Marykay Lex, M.D., MS PRIMARY CARE PROVIDER: Cala Bradford, MD PRIMARY CARDIOLOGIST: Donato Schultz, M.D.  PATIENT:  [name] is a 69 y.o. female with history of MI/CAD (MI in 63) status post CABG roughly 20 years ago in [place] (reportedly two-vessel CABG) along with PAD status post iliac stenting and left external iliac to PTA bypass, along with cardiac risk factors of hypertension, hyperlipidemia, diabetes who had previously documented EF of 20-25% by echo in 2014. She has had 2 strokes the last in February 2013 along with COPD. She has permanent tracheostomy done in November 2014 secondary to scarring from ET tube. She was admitted on March 15 for severe dyspnea with substernal chest pressure. She did have a large mucous plug removed.  Echocardiogram demonstrated EF of roughly 50% with mildly elevated PA pressures. She had a Myoview stress test performed that was read as High Risk  with EF of 25% and a dilated LV with severe global hypokinesis as well as anteroapical akinesis. There was a moderate size and intensity partially reversible anterior defect likely obtaining scar with peri-infarct ischemia versus significant have any myocardium. Based on these results she is referred for invasive evaluation with right heart catheterization to evaluate her coronary anatomy as well as pulmonary pressures in setting of acute on chronic combined systolic and diastolic heart failure.  PRE-OPERATIVE DIAGNOSIS:    Acute on Chronic Combined Systolic and Diastolic Heart Failure  CAD status post CABG x 2 > 20 years ago  High Risk Nuclear Stress Test  PROCEDURES PERFORMED:    Right & Left Heart Catheterization with Native Coronary and SVG Angiography  via Right Common Femoral Artery &  Vein Access.  Left  Ventriculography  PROCEDURE: The patient was brought to the 2nd Floor [facility] in the fasting state and prepped and draped in the usual sterile fashion for right femoral arterial and radial access. Sterile technique was used including antiseptics, cap, gloves, gown, hand hygiene, mask and sheet. Skin prep: Chlorhexidine.   Consent: Risks of procedure as well as the alternatives and risks of each were explained to the (patient/caregiver). Consent for procedure obtained.   Time Out: Verified patient identification, verified procedure, site/side was marked, verified correct patient position, special equipment/implants available, medications/allergies/relevent history reviewed, required imaging and test results available. Performed.  Access:  Right Common Femoral Artery: 5 Fr Sheath - fluoroscopically guided modified Seldinger Technique  Right Common Femoral Vein: 7 Fr Sheath - Seldinger Technique..  Right Heart Catheterization: 7 Fr Theone Murdoch catheter advanced under fluoroscopy with balloon inflated to the RA, RV, then PCWP-PA for hemodynamic measurement.  Simultaneous FA & PA blood gases checked for SaO2% to calculate FICK CO/CI  Thermodilution Injections performed to calculate CO/CI  Simultaneous PCWP/LV & RV/LV pressures monitored with Angled Pigtail in LV.  Catheter removed completely out of the body with balloon deflated.  Left Heart Catheterization: 5 Fr Catheters advanced or exchanged over a J-wire; JL4 catheter advanced first.  LV Hemodynamics (hand injection LV Gram): Straight pigtail catheter Left Coronary Artery Cineangiography: JL4 Catheter  Right Coronary Artery, SVG-RCA & LAD Cineangiography: JR4 Catheter   Sheaths will be removed in the holding area with manual pressure for hemostasis.   EBL: < 20ml, not including ABG and VBG samples   FINDINGS:  Hemodynamics:  Findings:   SaO2%  Pressures mmHg  Mean P  mmHg  EDP  mmHg   Right Atrium    13/19     16   Right Ventricle   52/6  14   Pulmonary Artery  59-60   61/20   40    PCWP    29/25 26   Central Aortic  Post hydralazine   94   194/88  139/54   129  84    Left Ventricle   140/9  26   PVR  SVR    Resistance 278/ 495  2417 / 4302    Cardiac Output:   Cardiac Index:    Fick   5.1  2.91    Thermodilution   3.74    2.1     Left Ventriculography:  EF: ~45 %  Wall Motion: basal to mid inferior HK; no evidence of apical HK.  Coronary Anatomy:  Left Main:large caliber, Calcified vessel. The vessel continues distally as a large either high OM versus ramus intermedius. The LAD is 100% ostial occluded as is what appears to be a small AV groove circumflex.  The entire path of the LAD is visualized with extensive calcification.  Left Circumflex-High OM Versus Ramus Intermedius: Very large caliber vessel that is moderately calcified in the proximal segment. It bifurcates into a moderate large-caliber and moderate caliber branches. The moderate caliber branch is more consistent with a lower obtuse marginal and bifurcates in the Su small branches distally. Mild diffuse disease after an ostial 40% stenosis at its takeoff. The larger more anterior branch bifurcates into 2 moderate caliber also bifurcating branches. One of the branches has a roughly 50% lesion at the takeoff of but is not a PCI amenable lesion.  There is mild left to right collateralization to the right posterolateral system. There is also retrograde filling of what looks to be a small AV groove circumflex branch.   RCA: moderate caliber, likely dominant vessel that is 100% proximally occluded after an RV marginal branch. There is proximal stenosis of 90% prior to the RV branch. The RV branch itself has diffuse disease with the most significant lesion being 90+ percent. That is about a 1 mm vessel.   SVG-RCA looks to be chronically Orly some chronically occluded with proximal occlusion and a very small threadlike flow for  20-30 mm distally before being 100% occluded.  -- Based on the extent of disease distally after the initial lesion, this was not felt to be appropriate for PCI.    SVG-mid LAD: widely patent graft to the distal portion the mid LAD. The LAD itself is a wraparound vessel that perfuses at least the distal third if not half of the inferoapex. There is diffuse moderate disease in the follow on LAD beyond the graft until the inferoapical segment with there is a focal 70% stenosis NA 1.5 mm vessel.  Retrograde flow into the proximal LAD reveals several segmental focal lesions of at least 80-90% after 100% occlusion of a heavily calcified segment. There are small diagonal branches. It is quite likely that reversible ischemia on the Myoview would be involving this segment with poor retrograde flow from the graft.  After reviewing the initial angiography, the culprit lesion was thought to be 100% ostial LAD with severe proximal disease & poor retrograde filling from SVG-LAD as well as CTO of RCA with 100% SVG-RCA occlusion.Marland Kitchen  Preparation were made to proceed with PCI on this lesion.  MEDICATIONS:  Anesthesia:  Local Lidocaine 18 ml  Sedation:  1  mg IV Versed, 50 mcg IV fentanyl ;   Omnipaque Contrast: 60 ml  IV Hydralazine 20 mg IV  PATIENT DISPOSITION:    The patient was transferred to the PACU holding area in a hemodynamicaly stable, chest pain free condition.  The patient tolerated the procedure well, and there were no complications.  EBL:   < 20 ml + samples  The patient was stable before, during, and after the procedure.  POST-OPERATIVE DIAGNOSIS:    Severe Native  & graft CAD with 100% RCA, LAD & small AV Groove Cx along with SVG-RCA  Widely patent SVG-distal Mid Wraparound LAD with severe diffuse proximal LAD disease limiting retrograde flow.  Widely patent native Ramus vs. Large Lateral OM with 2 large bifurcating branches that have diffuse moderate disease.  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  Continue Med Rx of Combined Systolic & Diastolic HF - needs afterload reduction & aggressive diuresis based upon elevated LVEDP & RHC pressures.  Medical management of CAD - no PCI option on SVG-RCA or native RCA.  Defer further plans to primary cardiology team    [facility] LLCARDING, Piedad ClimesAVID W, M.D., M.S. [facility] [address] [place], [place]  [id number]  [phone]  01/09/2015 8:48 AM

## 2015-01-09 NOTE — Progress Notes (Signed)
Site area: Right  Groin a 5 french arterial and 7 french venous sheath was removed  Site Prior to Removal:  Level 1  Pressure Applied For 40 MINUTES    Minutes Beginning at 0940a  Manual:   Yes.    Patient Status During Pull:  stable  Post Pull Groin Site:  Level 0  Post Pull Instructions Given:  Yes.    Post Pull Pulses Present:  Yes.    Dressing Applied:  Yes.    Comments:  VS remain stable during sheath pull.  Pt denies any discomfort at site at this time.

## 2015-01-09 NOTE — Progress Notes (Signed)
Inpatient Diabetes Program Recommendations  AACE/ADA: New Consensus Statement on Inpatient Glycemic Control (2013)  Target Ranges:  Prepandial:   less than 140 mg/dL      Peak postprandial:   less than 180 mg/dL (1-2 hours)      Critically ill patients:  140 - 180 mg/dL    Inpatient Diabetes Program Recommendations Correction (SSI): Please increase to resistant tidwc and add HS correction scale  Primary MD is Laurann Montanaynthia White. Pt may need be started on other agents to lower her HgbA1C of 9.1% Need encourage patient to follow up with MD Ordered ed'l videos, materials, ADA ed booklet as well.   Thank you Lenor CoffinAnn Kathyann Spaugh, RN, MSN, CDE  Diabetes Inpatient Program Office: [phone] Pager: [phone] 8:00 am to 5:00 pm

## 2015-01-09 NOTE — Progress Notes (Signed)
Patient taken off floor. RT unable to check trach at this time.

## 2015-01-10 LAB — GLUCOSE, CAPILLARY
GLUCOSE-CAPILLARY: 185 mg/dL — AB (ref 70–99)
GLUCOSE-CAPILLARY: 160 mg/dL — AB (ref 70–99)
Glucose-Capillary: 137 mg/dL — ABNORMAL HIGH (ref 70–99)
Glucose-Capillary: 198 mg/dL — ABNORMAL HIGH (ref 70–99)

## 2015-01-10 LAB — BASIC METABOLIC PANEL
Anion gap: 7 (ref 5–15)
BUN: 23 mg/dL (ref 6–23)
CALCIUM: 8.3 mg/dL — AB (ref 8.4–10.5)
CHLORIDE: 106 mmol/L (ref 96–112)
CO2: 28 mmol/L (ref 19–32)
CREATININE: 1.18 mg/dL — AB (ref 0.50–1.10)
GFR calc Af Amer: 54 mL/min — ABNORMAL LOW (ref >90)
GFR, EST NON AFRICAN AMERICAN: 46 mL/min — AB (ref >90)
GLUCOSE: 154 mg/dL — AB (ref 70–99)
POTASSIUM: 4.6 mmol/L (ref 3.5–5.1)
SODIUM: 141 mmol/L (ref 135–145)

## 2015-01-10 MED ORDER — FUROSEMIDE 10 MG/ML IJ SOLN
80.0000 mg | Freq: Two times a day (BID) | INTRAMUSCULAR | Status: DC
  Administered 2015-01-10 – 2015-01-11 (×3): 80 mg via INTRAVENOUS
  Filled 2015-01-10 (×5): qty 8

## 2015-01-10 MED ORDER — PNEUMOCOCCAL VAC POLYVALENT 25 MCG/0.5ML IJ INJ
0.5000 mL | INJECTION | INTRAMUSCULAR | Status: AC
  Administered 2015-01-11: 0.5 mL via INTRAMUSCULAR
  Filled 2015-01-10: qty 0.5

## 2015-01-10 MED ORDER — ISOSORBIDE MONONITRATE 15 MG HALF TABLET
15.0000 mg | ORAL_TABLET | Freq: Every day | ORAL | Status: DC
  Administered 2015-01-10 – 2015-01-13 (×4): 15 mg via ORAL
  Filled 2015-01-10 (×5): qty 1

## 2015-01-10 NOTE — Plan of Care (Signed)
Problem: Food- and Nutrition-Related Knowledge Deficit (NB-1.1) Goal: Nutrition education Formal process to instruct or train a patient/client in a skill or to impart knowledge to help patients/clients voluntarily manage or modify food choices and eating behavior to maintain or improve health. Outcome: Completed/Met Date Met:  01/10/15  RD consulted for nutrition education regarding diabetes.     Lab Results  Component Value Date    HGBA1C 9.1* 01/06/2015  RD provided "Carbohydrate Counting for People with Diabetes" handout from the Academy of Nutrition and Dietetics.  Spoke to daughter and patient. Patient reports having very disordered eating pattern. She will get up at 8 am, but not eat till 10 am. Then she will not eat again until dinner. She also eats meals/snacks consisting of high amounts of carbohydrate: bowl of cereal with fruit for breakfast or 2 pieces of fruit before bed.    Discussed different food groups and their effects on blood sugar, emphasizing carbohydrate-containing foods. Provided list of carbohydrates and recommended serving sizes of common foods. Discussed importance of eating 3 meals a day with some snacks in between or before bed if needed. Also discussed importance of following "plate method": each meal containing vegetables/carbs/protein or at least protein/carb if at breakfast. Advised each snack to contain carbohydrate with a protein/whole grain, never a carbohydrate or protein by itself.  Discussed importance of controlled and consistent carbohydrate intake throughout the day. Provided examples of ways to balance meals/snacks and encouraged intake of high-fiber, whole grain complex carbohydrates. Teach back method used.  Expect good compliance.  Body mass index is 31.08 kg/(m^2). Pt meets criteria for obese based on current BMI.  Current diet order is heart healthy/carb modified, patient is consuming approximately 100% of meals at this time. Labs and medications  reviewed. No further nutrition interventions warranted at this time. RD outpatient contact information provided. If additional nutrition issues arise, please re-consult RD.  Burtis Junes RD, LDN Nutrition Pager: [phone] 01/10/2015 2:43 PM

## 2015-01-10 NOTE — Progress Notes (Signed)
Patient ID: Scot Junorothy I Hornstein, female   DOB: 10/14/1946, 69 y.o.   MRN: [id number]    Subjective:    No complaints  Objective:   Temp:  [97.6 F (36.4 C)-98.6 F (37 C)] 98.6 F (37 C) (03/19 0500) Pulse Rate:  [67-84] 78 (03/19 1225) Resp:  [17-20] 18 (03/19 1225) BP: (92-139)/(41-63) 137/53 mmHg (03/19 0948) SpO2:  [85 %-100 %] 98 % (03/19 1225) FiO2 (%):  [28 %-35 %] 35 % (03/19 1225) Weight:  [169 lb 15.6 oz (77.1 kg)] 169 lb 15.6 oz (77.1 kg) (03/19 0500) Last BM Date: 01/08/15  Filed Weights   01/08/15 0702 01/09/15 0626 01/10/15 0500  Weight: 170 lb 4.2 oz (77.23 kg) 168 lb 8 oz (76.431 kg) 169 lb 15.6 oz (77.1 kg)    Intake/Output Summary (Last 24 hours) at 01/10/15 1330 Last data filed at 01/10/15 1024  Gross per 24 hour  Intake   3350 ml  Output   1900 ml  Net   1450 ml     Exam:  General: NAD  Resp: CTAB  Cardiac: RRR, no m/r/g, n JVD  GI: abdomen soft, NT, ND  MSK: no LE edema  Neuro: no focal deficits    Lab Results:  Basic Metabolic Panel:  Recent Labs Lab 01/08/15 0547 01/09/15 0441 01/09/15 1127 01/10/15 0404  NA 140 138  --  141  K 3.5 3.6  --  4.6  CL 100 99  --  106  CO2 27 32  --  28  GLUCOSE 142* 265*  --  154*  BUN 25* 24*  --  23  CREATININE 1.18* 1.21* 1.19* 1.18*  CALCIUM 8.8 8.3*  --  8.3*    Liver Function Tests:  Recent Labs Lab 01/07/15 0543  AST 13  ALT 18  ALKPHOS 73  BILITOT 0.8  PROT 5.9*  ALBUMIN 3.0*    CBC:  Recent Labs Lab 01/06/15 0520 01/09/15 1127  WBC 6.3 4.4  HGB 9.6* 10.4*  HCT 30.4* 34.1*  MCV 95.6 93.4  PLT 165 197    Cardiac Enzymes:  Recent Labs Lab 01/06/15 2003 01/06/15 2315 01/07/15 0543  TROPONINI 0.07* 0.08* 0.08*    BNP: No results for input(s): PROBNP in the last 8760 hours.  Coagulation:  Recent Labs Lab 01/06/15 2003  INR 1.14    ECG:   Medications:   Scheduled Medications: . antiseptic oral rinse  7 mL Mouth Rinse q12n4p  . aspirin EC  81 mg  Oral Daily  . carvedilol  25 mg Oral BID WC  . chlorhexidine  15 mL Mouth Rinse BID  . clopidogrel  75 mg Oral Daily  . DULoxetine  60 mg Oral Daily  . enoxaparin (LOVENOX) injection  40 mg Subcutaneous Q24H  . ferrous sulfate  325 mg Oral BID WC  . gabapentin  300 mg Oral BID  . insulin aspart  0-15 Units Subcutaneous TID WC  . insulin glargine  16 Units Subcutaneous QHS  . irbesartan  300 mg Oral QHS  . multivitamin with minerals  1 tablet Oral Daily  . nitroGLYCERIN  0.5 inch Topical 4 times per day  . potassium chloride  20 mEq Oral BID  . regadenoson  0.4 mg Intravenous Once  . simvastatin  40 mg Oral QPM  . sodium chloride  3 mL Intravenous Q12H     Infusions:     PRN Medications:  sodium chloride, acetaminophen, ALPRAZolam, hydrALAZINE, ipratropium-albuterol, loperamide, nitroGLYCERIN, ondansetron (ZOFRAN) IV, oxyCODONE-acetaminophen, sodium chloride, zolpidem  Assessment/Plan   1. Acute on chronic combined systolic/diastolic HF - echo 12/2014 LVEF 50%, elevated LA pressures - RHC with mean PA 40, PCWP 26, CI 2.1. LVEDP 26.  - CXR 01/06/15 with pulm edema - he is net negaive 1.2 liters since admission. Cr and BUN stable.  - baseline Cr 1.1-1.3 - will start IV lasix 80 mg bid  2. CAD - hx of CABG 20 years ago - recent episodes of chest pain, had abnormal myoview, referred for cath - cath 01/09/15 LM patent, LAD 100% occluded, LCX moderate disease, RCA 100% occluded. SVG-RCA CTO, SVG-mid LAD patent with more distal LAD 70% stenosis. Recs for medical management.  - no recent chest pain. Start imdur as additional antianginal       Dina Rich, M.D.

## 2015-01-10 NOTE — Progress Notes (Signed)
Resting comfortably With Trach collar on with O2 5 /M Olivarez . Pausey out . No ;eakage . Verbalized plan of care. Safety precauition observed

## 2015-01-11 DIAGNOSIS — I5043 Acute on chronic combined systolic (congestive) and diastolic (congestive) heart failure: Secondary | ICD-10-CM | POA: Diagnosis not present

## 2015-01-11 LAB — BASIC METABOLIC PANEL
Anion gap: 3 — ABNORMAL LOW (ref 5–15)
BUN: 31 mg/dL — ABNORMAL HIGH (ref 6–23)
CO2: 34 mmol/L — AB (ref 19–32)
CREATININE: 1.22 mg/dL — AB (ref 0.50–1.10)
Calcium: 8.7 mg/dL (ref 8.4–10.5)
Chloride: 103 mmol/L (ref 96–112)
GFR calc non Af Amer: 44 mL/min — ABNORMAL LOW (ref >90)
GFR, EST AFRICAN AMERICAN: 52 mL/min — AB (ref >90)
Glucose, Bld: 207 mg/dL — ABNORMAL HIGH (ref 70–99)
Potassium: 5.1 mmol/L (ref 3.5–5.1)
SODIUM: 140 mmol/L (ref 135–145)

## 2015-01-11 LAB — GLUCOSE, CAPILLARY
GLUCOSE-CAPILLARY: 249 mg/dL — AB (ref 70–99)
Glucose-Capillary: 170 mg/dL — ABNORMAL HIGH (ref 70–99)
Glucose-Capillary: 276 mg/dL — ABNORMAL HIGH (ref 70–99)
Glucose-Capillary: 104 mg/dL — ABNORMAL HIGH (ref 70–99)

## 2015-01-11 LAB — MAGNESIUM: Magnesium: 2.5 mg/dL (ref 1.5–2.5)

## 2015-01-11 NOTE — Progress Notes (Signed)
Patient ID: [name], female   DOB: 08/27/1946, 69 y.o.   MRN: [id number]    Primary cardiologist:  Subjective:   SOB improving  Objective:   Temp:  [97.4 F (36.3 C)-98.3 F (36.8 C)] 98.3 F (36.8 C) (03/20 0522) Pulse Rate:  [63-91] 72 (03/20 0804) Resp:  [16-20] 16 (03/20 0804) BP: (129-176)/(49-66) 129/49 mmHg (03/20 0522) SpO2:  [97 %-100 %] 97 % (03/20 0804) FiO2 (%):  [35 %] 35 % (03/20 0804) Weight:  [173 lb 1 oz (78.5 kg)] 173 lb 1 oz (78.5 kg) (03/20 0522) Last BM Date: 01/10/15  Filed Weights   01/09/15 0626 01/10/15 0500 01/11/15 0522  Weight: 168 lb 8 oz (76.431 kg) 169 lb 15.6 oz (77.1 kg) 173 lb 1 oz (78.5 kg)    Intake/Output Summary (Last 24 hours) at 01/11/15 1217 Last data filed at 01/11/15 0943  Gross per 24 hour  Intake    960 ml  Output   2500 ml  Net  -1540 ml    Exam:  General: NAD  Resp: crackles bilateral bases in bases  Cardiac: RRR, no m/r/g, no JVD  GI: abdomen soft, NT, ND  MSK: 1+ bilateral edema  Neuro: no focal deficits   Lab Results:  Basic Metabolic Panel:  Recent Labs Lab 01/09/15 0441 01/09/15 1127 01/10/15 0404 01/11/15 0552  NA 138  --  141 140  K 3.6  --  4.6 5.1  CL 99  --  106 103  CO2 32  --  28 34*  GLUCOSE 265*  --  154* 207*  BUN 24*  --  23 31*  CREATININE 1.21* 1.19* 1.18* 1.22*  CALCIUM 8.3*  --  8.3* 8.7  MG  --   --   --  2.5    Liver Function Tests:  Recent Labs Lab 01/07/15 0543  AST 13  ALT 18  ALKPHOS 73  BILITOT 0.8  PROT 5.9*  ALBUMIN 3.0*    CBC:  Recent Labs Lab 01/06/15 0520 01/09/15 1127  WBC 6.3 4.4  HGB 9.6* 10.4*  HCT 30.4* 34.1*  MCV 95.6 93.4  PLT 165 197    Cardiac Enzymes:  Recent Labs Lab 01/06/15 2003 01/06/15 2315 01/07/15 0543  TROPONINI 0.07* 0.08* 0.08*    BNP: No results for input(s): PROBNP in the last 8760 hours.  Coagulation:  Recent Labs Lab 01/06/15 2003  INR 1.14    ECG:   Medications:   Scheduled  Medications: . antiseptic oral rinse  7 mL Mouth Rinse q12n4p  . aspirin EC  81 mg Oral Daily  . carvedilol  25 mg Oral BID WC  . chlorhexidine  15 mL Mouth Rinse BID  . clopidogrel  75 mg Oral Daily  . DULoxetine  60 mg Oral Daily  . enoxaparin (LOVENOX) injection  40 mg Subcutaneous Q24H  . ferrous sulfate  325 mg Oral BID WC  . furosemide  80 mg Intravenous BID  . gabapentin  300 mg Oral BID  . insulin aspart  0-15 Units Subcutaneous TID WC  . insulin glargine  16 Units Subcutaneous QHS  . irbesartan  300 mg Oral QHS  . isosorbide mononitrate  15 mg Oral Daily  . multivitamin with minerals  1 tablet Oral Daily  . potassium chloride  20 mEq Oral BID  . regadenoson  0.4 mg Intravenous Once  . simvastatin  40 mg Oral QPM  . sodium chloride  3 mL Intravenous Q12H     Infusions:  PRN Medications:  sodium chloride, acetaminophen, ALPRAZolam, hydrALAZINE, ipratropium-albuterol, loperamide, nitroGLYCERIN, ondansetron (ZOFRAN) IV, oxyCODONE-acetaminophen, sodium chloride, zolpidem     Assessment/Plan   1. Acute on chronic combined systolic/diastolic HF - echo 12/2014 LVEF 50%, elevated LA pressures - RHC this admit with mean PA 40, PCWP 26, CI 2.1. LVEDP 26.  - CXR 01/06/15 with pulm edema - she is net negaive 1.7 liters yesterday, negative 2.7 liters since admission. Cr and BUN overall stable. Baseline Cr 1.1-1.3. Continue IV lasix today.   2. CAD - hx of CABG 20 years ago - recent episodes of chest pain, had abnormal myoview, referred for cath - cath 01/09/15 LM patent, LAD 100% occluded, LCX moderate disease, RCA 100% occluded. SVG-RCA CTO, SVG-mid LAD patent with more distal LAD 70% stenosis. Recs for medical management.  - no recent chest pain. Start imdur as additional antianginal        Dina Rich, M.D.

## 2015-01-11 NOTE — Progress Notes (Signed)
CSW received referral for Home health needs.  Inappropriate CSW referral.   Please consult RNCM for home health needs.   CSW signing off.   Please re-consult if social work needs arise.   Loletta SpecterSuzanna Pairlee Sawtell, MSW, LCSW Clinical Social Work [employer]  [phone]

## 2015-01-11 NOTE — Progress Notes (Signed)
Pt sitting on side of bed eating. PMV on. Pt remains on 35% ATC tolerating well, no distress noted at this time. RT will continue to monior.

## 2015-01-12 DIAGNOSIS — N189 Chronic kidney disease, unspecified: Secondary | ICD-10-CM

## 2015-01-12 DIAGNOSIS — N179 Acute kidney failure, unspecified: Secondary | ICD-10-CM

## 2015-01-12 DIAGNOSIS — I70219 Atherosclerosis of native arteries of extremities with intermittent claudication, unspecified extremity: Secondary | ICD-10-CM

## 2015-01-12 DIAGNOSIS — J810 Acute pulmonary edema: Secondary | ICD-10-CM

## 2015-01-12 LAB — BASIC METABOLIC PANEL
ANION GAP: 5 (ref 5–15)
BUN: 41 mg/dL — ABNORMAL HIGH (ref 6–23)
CHLORIDE: 99 mmol/L (ref 96–112)
CO2: 35 mmol/L — ABNORMAL HIGH (ref 19–32)
Calcium: 8.9 mg/dL (ref 8.4–10.5)
Creatinine, Ser: 1.46 mg/dL — ABNORMAL HIGH (ref 0.50–1.10)
GFR calc Af Amer: 41 mL/min — ABNORMAL LOW (ref >90)
GFR, EST NON AFRICAN AMERICAN: 36 mL/min — AB (ref >90)
Glucose, Bld: 147 mg/dL — ABNORMAL HIGH (ref 70–99)
POTASSIUM: 5.2 mmol/L — AB (ref 3.5–5.1)
Sodium: 139 mmol/L (ref 135–145)

## 2015-01-12 LAB — GLUCOSE, CAPILLARY
GLUCOSE-CAPILLARY: 236 mg/dL — AB (ref 70–99)
GLUCOSE-CAPILLARY: 280 mg/dL — AB (ref 70–99)
Glucose-Capillary: 128 mg/dL — ABNORMAL HIGH (ref 70–99)
Glucose-Capillary: 205 mg/dL — ABNORMAL HIGH (ref 70–99)

## 2015-01-12 LAB — MAGNESIUM: MAGNESIUM: 2.5 mg/dL (ref 1.5–2.5)

## 2015-01-12 NOTE — Progress Notes (Signed)
Patient: [name] / Admit Date: 01/06/2015 / Date of Encounter: 01/12/2015, 8:25 AM   Subjective: Feeling better. No CP or SOB. Has not been out of bed except to commode.   Objective: Telemetry: NSR occasional PVCs Physical Exam: Blood pressure 133/62, pulse 62, temperature 97.9 F (36.6 C), temperature source Oral, resp. rate 16, height 5\' 2"  (1.575 m), weight 166 lb 12.8 oz (75.66 kg), SpO2 99 %. General: Well developed, well nourished, in no acute distress. Head: Normocephalic, atraumatic, sclera non-icteric, no xanthomas, nares are without discharge. Neck: JVP not elevated. Trache collar in place. Lungs: Clear bilaterally to auscultation without wheezes, rales, or rhonchi. Breathing is unlabored. Heart: RRR S1 S2 without murmurs, rubs, or gallops.  Abdomen: Soft, non-tender, non-distended with normoactive bowel sounds. No rebound/guarding. Extremities: No clubbing or cyanosis. No edema. Distal pedal pulses are 2+ and equal bilaterally. Neuro: Alert and oriented X 3. Moves all extremities spontaneously. Psych:  Responds to questions appropriately with a normal affect.   Intake/Output Summary (Last 24 hours) at 01/12/15 0825 Last data filed at 01/12/15 0543  Gross per 24 hour  Intake    720 ml  Output   4100 ml  Net  -3380 ml    Inpatient Medications:  . antiseptic oral rinse  7 mL Mouth Rinse q12n4p  . aspirin EC  81 mg Oral Daily  . carvedilol  25 mg Oral BID WC  . chlorhexidine  15 mL Mouth Rinse BID  . clopidogrel  75 mg Oral Daily  . DULoxetine  60 mg Oral Daily  . enoxaparin (LOVENOX) injection  40 mg Subcutaneous Q24H  . ferrous sulfate  325 mg Oral BID WC  . furosemide  80 mg Intravenous BID  . gabapentin  300 mg Oral BID  . insulin aspart  0-15 Units Subcutaneous TID WC  . insulin glargine  16 Units Subcutaneous QHS  . irbesartan  300 mg Oral QHS  . isosorbide mononitrate  15 mg Oral Daily  . multivitamin with minerals  1 tablet Oral Daily  . potassium  chloride  20 mEq Oral BID  . regadenoson  0.4 mg Intravenous Once  . simvastatin  40 mg Oral QPM  . sodium chloride  3 mL Intravenous Q12H   Infusions:    Labs:  Recent Labs  01/11/15 0552 01/12/15 0538  NA 140 139  K 5.1 5.2*  CL 103 99  CO2 34* 35*  GLUCOSE 207* 147*  BUN 31* 41*  CREATININE 1.22* 1.46*  CALCIUM 8.7 8.9  MG 2.5 2.5   No results for input(s): AST, ALT, ALKPHOS, BILITOT, PROT, ALBUMIN in the last 72 hours.  Recent Labs  01/09/15 1127  WBC 4.4  HGB 10.4*  HCT 34.1*  MCV 93.4  PLT 197   No results for input(s): CKTOTAL, CKMB, TROPONINI in the last 72 hours. Invalid input(s): POCBNP No results for input(s): HGBA1C in the last 72 hours.   Radiology/Studies:  Nm Myocar Multi W/spect W/wall Motion / Ef  01/08/2015   HISTORY OF PRESENT ILLNESS: Nuclear Med Background  Indication for Stress Test:  Chest pain  History: [name] is a 69 y.o. female whom is somewhat of a poor historian; her daughter is at the bedside and able to assist with some questions. She has an extensive past medical history of CAD s/p CABG and stenting done approximately [place] ago in [place], she does not know which arteries were bypassed; HTN, HLD, DM, PVD, she underwent Left external iliac to posterior tibial  artery bypass using nonreversed ipsilateral greater saphenous vein by Dr. 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 She only requires oxygen at night and when she leaves her home.  She became SOB at rest yesterday around 1800, she has PRN nebulizer treatments at home but she did not do a treatment or increase her oxygen level. The  SOB got worse with exertion and around 0300 she developed mid-sternal chest pressure without radiation, the pressure would come and go, she did not take a nitroglycerin. Her daughter, whom she lives with, became concerned and felt it was best to call EMS.  When EMS arrived her chest pain was an 8/10 she was given aspirin and nitro x 2 with full relief of chest pain after second dose. EMS suctioned her trach and retrieved a mucus plug which eased her breathing. Upon arrival to the ED she was much more comfortable with chest pain at a 2/10, 02 sats 92% on room air, heart rate and BP were WNL. Her BNP upon arrival was 1061.8, Chest x-ray showed marked cardiomegaly, and she had an elevated troponin level of 0.09. EKG without signs of a STEMI.  She has been taking all over medications as prescribed but admits she has not been weighing herself daily or following a heart healthy diet. Last known weight was 176 lbs which she says was from an MD appointment 3 months ago. She did notice on Saturday that she was having swelling in her bilateral lower extremities and that her abdomen felt distended. She did have some nausea this a.m. not correlated with the chest pain, no vomiting. Denies any dizziness or weakness. Ambulates at home with a walker and cane.  Cardiac Risk Factors:  As above  Symptoms:  Chest pain  PROCEDURE: Nuclear Pre-Procedure  Caffeine/Decaff Intake:  NPO After: MN.  Lungs:  O2 Stat:  IV 0.9% NS with Angio Cath:  Chest Size (in):  Cup Size:  Height: 5'2"  Weight:  170 lb  BMI:  Tech Comments:  Nuclear Med Study  1 or 2 day study: 1  Stress Test Type: Lexiscan  Reading MD: Hilty  Order Authorizing Provider: Tresa Endo  Resting Radionuclide: Sestamibi  Resting Radionuclide Dose:  10 mCi  Stress Radionuclide: Sestamibi  Stress Radionuclide Dose:  30 mCi  Stress Protocol  Rest HR: 82  Stress HR: 81  Rest BP: 154/78  Stress BP: 121/53  Exercise Time (min):  METS:  Dose of Adenosine (mg):  Dose of Lexiscan:  0.4 mg   Dose of Atropine (mg):  Dose of Dobutamine:  Stress Test Technologist:  Nuclear Technologist:  Rest Procedure:  Stress Procedure:  Transient Ischemic Dilatation (Normal <1.22): 1.15  Lung/Heart Ratio (Normal <0.45):  QGS EDV: 183  QGS ESV: 138  LV Ejection Fraction: 25%  Rest ECG:  NSR with inferior TWI  Stress ECG:  NSR with more significant TWI inferiorly  Raw Data Images: Significant gut uptake, does not interfere with the study  Stress Images: Moderate size and intensity anterior perfusion defect  Rest Images: Moderate size and mild intensity anterior perfusion defect  Subtraction (SDS): 6  IMPRESSION: Exercise Capacity:  N/A  BP Response: normal  Clinical Symptoms:  Mild dyspnea and chest pain  ECG Impression:  TWI's and lateral ST changes with lexiscan  Comparison with Prior Nuclear Study: None  Final Impression:  High risk nuclear stress test. LVEF 25%, dilated LV with severe global hypokinesis and anteroapical akinesis. There is a moderate-sized and intensity, partially reversible anterior perfusion defect which could represent scar with peri-infarct ischemia or significant hybernating myocardium.   Electronically Signed   By: Chrystie Nose   On: 01/08/2015 13:49   Dg Chest Port 1 View  01/06/2015   CLINICAL DATA:  Dyspnea and chest pain  EXAM: PORTABLE CHEST - 1 VIEW  COMPARISON:  08/11/2013  FINDINGS: There is prominent cardiomegaly, unchanged. There is prior sternotomy. There is a tracheostomy appliance in the midline. There is extensive ground-glass opacity in the central and basilar regions which could represent alveolar edema. Moderate vascular and interstitial prominence is present. The findings may represent congestive heart failure. No large effusions are evident.  IMPRESSION: Marked cardiomegaly.  Probable congestive heart failure.   Electronically Signed   By: Ellery Plunk M.D.   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 Admitted with dyspnea, CP. EMS suctioned her trach and retrieved a mucus plug which eased her breathing. Minimal troponin level elevation to 0.08.  1. Acute on chronic combined systolic/diastolic HF - CXR 01/06/15 with pulm edema - echo 01/07/2015 LVEF 50%, high ventricular filling pressure, mildly dilated LA/RA/RV, mild-mod reduced RV function - RHC this admit with mean PA 40, PCWP 26, CI 2.1. LVEDP 26.  - she is net negative 6L this admission - hold Lasix today given rise in BUN/Cr  2. 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 CAD - hx of CABG 20 years ago - recent episodes of chest pain, had abnormal myoview, referred for cath - cath 01/09/15 LM patent, LAD 100% occluded, LCX moderate disease, RCA 100% occluded. SVG-RCA CTO, SVG-mid LAD patent with more distal LAD 70% stenosis. Recs for medical management.  - Imdur added yesterday - will follow BP with this  Signed, Dayna Dunn PA-C  I have seen and examined the patient along with Dayna Dunn PA-C.  I have reviewed the chart, notes and new data.  I agree with PA's note.  Key new complaints: improved since admission Key examination changes: no overt hypervolemia Key new findings / data: cath hemodynamics reviewed; creat trending slightly worse  PLAN: DC in AM if creatinine is stable or improving.  Thurmon Fair, MD, [facility] [phone] 01/12/2015, 10:19 AM

## 2015-01-12 NOTE — Evaluation (Signed)
Physical Therapy Evaluation Patient Details Name: [name] MRN: [id number] DOB: 04/30/1946 Today's Date: 01/12/2015   History of Present Illness  69 yo female with CHF exacerbation was catheterized, noted global hypokinesia, anteroapical hypokinesia and EF 50%.  Hibernating MI was hypothesized.  Clinical Impression  Pt was seen for evaluation of her condition with assessment of O2 during gait off trach collar.  Her effort was good and had to restrict conversation to minimize the dropping of O2 sats.  Afterward recovery to 90+% of O2 was one minute with good tolerance by pt.    Follow Up Recommendations Home health PT;Supervision/Assistance - 24 hour    Equipment Recommendations  None recommended by PT    Recommendations for Other Services       Precautions / Restrictions Precautions Precautions: Other (comment) (cardiac, has a trach) Precaution Comments: trach with oxygen 4L overlying it. Restrictions Weight Bearing Restrictions: No      Mobility  Bed Mobility Overal bed mobility: Needs Assistance Bed Mobility: Supine to Sit     Supine to sit: Min assist     General bed mobility comments: pt uses bedrail minimally and PT helped mainly to scoot to edge of bed  Transfers Overall transfer level: Modified independent Equipment used: Rolling walker (2 wheeled) (reminders for hand placement)                Ambulation/Gait Ambulation/Gait assistance: Min assist Ambulation Distance (Feet): 250 Feet Assistive device: Rolling walker (2 wheeled) Gait Pattern/deviations: Step-through pattern;Decreased stride length;Wide base of support Gait velocity: reduced with encouragement to control by PT Gait velocity interpretation: Below normal speed for age/gender General Gait Details: Pt was assessed for O2 consumption during gait an was safely able to control O2 within 90% and above, dropped for 1 minute to 85% then back.  Stairs            Wheelchair Mobility     Modified Rankin (Stroke Patients Only)       Balance Overall balance assessment: Needs assistance       Postural control: Other (comment) (forward lean due to back pain) Standing balance support: Bilateral upper extremity supported Standing balance-Leahy Scale: Fair Standing balance comment: walker assists pain control with back                             Pertinent Vitals/Pain Pain Assessment: No/denies pain    Home Living Family/patient expects to be discharged to:: Private residence Living Arrangements: Children Available Help at Discharge: Family Type of Home: House Home Access: Level entry     Home Layout: One level Home Equipment: Environmental consultantWalker - 2 wheels;Cane - single point      Prior Function Level of Independence: Independent with assistive device(s)         Comments: used whatever gait device was indicated     Hand Dominance        Extremity/Trunk Assessment   Upper Extremity Assessment: Overall WFL for tasks assessed           Lower Extremity Assessment: Generalized weakness      Cervical / Trunk Assessment: Normal  Communication   Communication: No difficulties  Cognition Arousal/Alertness: Awake/alert Behavior During Therapy: WFL for tasks assessed/performed Overall Cognitive Status: Within Functional Limits for tasks assessed                      General Comments General comments (skin integrity, edema, etc.): Pt in her  room with willingness to sit up bedside then in chair.  Supported back posture with pillow in chair with pt voicing comfort.    Exercises Other Exercises Other Exercises: Strength tested to 4- LLE and 4+ RLE      Assessment/Plan    PT Assessment Patient needs continued PT services  PT Diagnosis Generalized weakness   PT Problem List Decreased strength;Decreased range of motion;Decreased activity tolerance;Decreased balance;Decreased mobility;Decreased coordination;Cardiopulmonary status  limiting activity  PT Treatment Interventions DME instruction;Gait training;Functional mobility training;Therapeutic activities;Therapeutic exercise;Balance training;Neuromuscular re-education;Patient/family education   PT Goals (Current goals can be found in the Care Plan section) Acute Rehab PT Goals Patient Stated Goal: to get home when ready PT Goal Formulation: With patient Time For Goal Achievement: 01/26/15 Potential to Achieve Goals: Good    Frequency Min 3X/week   Barriers to discharge Decreased caregiver support Home with intermittent help    Co-evaluation               End of Session Equipment Utilized During Treatment: Other (comment) (oxygen removed to ck sats with nursing permission) Activity Tolerance: Patient tolerated treatment well;Patient limited by fatigue Patient left: in chair;with call bell/phone within reach Nurse Communication: Mobility status         Time: 2841-3244 PT Time Calculation (min) (ACUTE ONLY): 24 min   Charges:   PT Evaluation $Initial PT Evaluation Tier I: 1 Procedure PT Treatments $Gait Training: 8-22 mins   PT G CodesIvar Drape 02-02-2015, 5:37 PM   Samul Dada, PT MS Acute Rehab Dept. Number: [phone]

## 2015-01-12 NOTE — Plan of Care (Signed)
Problem: Acute Rehab PT Goals(only PT should resolve) Goal: Pt Will Ambulate And O2 sats 90% or greater after gait as well as during the activity

## 2015-01-12 NOTE — Progress Notes (Signed)
VASCULAR LAB PRELIMINARY  ARTERIAL  ABI completed:    RIGHT    LEFT    PRESSURE WAVEFORM  PRESSURE WAVEFORM  BRACHIAL 127 Triphasic BRACHIAL 145 Triphasic  DP 114 Monophasic DP 104 Biphasic  PT 125 Monophasic PT 122 Triphasic    RIGHT LEFT  ABI 0.86 0.84   Mild technical difficulty due to cardiac arrhthymia.  ABIs indicate a mild reduction in arterial flow bilaterally at rest. Right abnormal Doppler waveforms may suggest a false elevation in pressures  [name], RVS 01/12/2015, 8:28 AM

## 2015-01-13 ENCOUNTER — Encounter (HOSPITAL_COMMUNITY): Payer: Self-pay | Admitting: Physician Assistant

## 2015-01-13 LAB — GLUCOSE, CAPILLARY
GLUCOSE-CAPILLARY: 163 mg/dL — AB (ref 70–99)
Glucose-Capillary: 156 mg/dL — ABNORMAL HIGH (ref 70–99)

## 2015-01-13 LAB — BASIC METABOLIC PANEL
Anion gap: 8 (ref 5–15)
BUN: 41 mg/dL — ABNORMAL HIGH (ref 6–23)
CHLORIDE: 102 mmol/L (ref 96–112)
CO2: 28 mmol/L (ref 19–32)
Calcium: 9 mg/dL (ref 8.4–10.5)
Creatinine, Ser: 1.37 mg/dL — ABNORMAL HIGH (ref 0.50–1.10)
GFR calc Af Amer: 45 mL/min — ABNORMAL LOW (ref >90)
GFR calc non Af Amer: 39 mL/min — ABNORMAL LOW (ref >90)
GLUCOSE: 143 mg/dL — AB (ref 70–99)
POTASSIUM: 5.6 mmol/L — AB (ref 3.5–5.1)
SODIUM: 138 mmol/L (ref 135–145)

## 2015-01-13 LAB — MAGNESIUM: MAGNESIUM: 2.6 mg/dL — AB (ref 1.5–2.5)

## 2015-01-13 MED ORDER — FUROSEMIDE 40 MG PO TABS
40.0000 mg | ORAL_TABLET | Freq: Two times a day (BID) | ORAL | Status: DC
  Filled 2015-01-13 (×2): qty 1

## 2015-01-13 MED ORDER — ISOSORBIDE MONONITRATE ER 30 MG PO TB24: 15.0000 mg | ORAL_TABLET | Freq: Every day | ORAL | Status: DC

## 2015-01-13 NOTE — Discharge Instructions (Signed)
Heart Failure °Heart failure means your heart has trouble pumping blood. This makes it hard for your body to work well. Heart failure is usually a long-term (chronic) condition. You must take good care of yourself and follow your doctor's treatment plan. °HOME CARE °· Take your heart medicine as told by your doctor. °¨ Do not stop taking medicine unless your doctor tells you to. °¨ Do not skip any dose of medicine. °¨ Refill your medicines before they run out. °¨ Take other medicines only as told by your doctor or pharmacist. °· Stay active if told by your doctor. The elderly and people with severe heart failure should talk with a doctor about physical activity. °· Eat heart-healthy foods. Choose foods that are without trans fat and are low in saturated fat, cholesterol, and salt (sodium). This includes fresh or frozen fruits and vegetables, fish, lean meats, fat-free or low-fat dairy foods, whole grains, and high-fiber foods. Lentils and dried peas and beans (legumes) are also good choices. °· Limit salt if told by your doctor. °· Cook in a healthy way. Roast, grill, broil, bake, poach, steam, or stir-fry foods. °· Limit fluids as told by your doctor. °· Weigh yourself every morning. Do this after you pee (urinate) and before you eat breakfast. Write down your weight to give to your doctor. °· Take your blood pressure and write it down if your doctor tells you to. °· Ask your doctor how to check your pulse. Check your pulse as told. °· Lose weight if told by your doctor. °· Stop smoking or chewing tobacco. Do not use gum or patches that help you quit without your doctor's approval. °· Schedule and go to doctor visits as told. °· Nonpregnant women should have no more than 1 drink a day. Men should have no more than 2 drinks a day. Talk to your doctor about drinking alcohol. °· Stop illegal drug use. °· Stay current with shots (immunizations). °· Manage your health conditions as told by your doctor. °· Learn to  manage your stress. °· Rest when you are tired. °· If it is really hot outside: °¨ Avoid intense activities. °¨ Use air conditioning or fans, or get in a cooler place. °¨ Avoid caffeine and alcohol. °¨ Wear loose-fitting, lightweight, and light-colored clothing. °· If it is really cold outside: °¨ Avoid intense activities. °¨ Layer your clothing. °¨ Wear mittens or gloves, a hat, and a scarf when going outside. °¨ Avoid alcohol. °· Learn about heart failure and get support as needed. °· Get help to maintain or improve your quality of life and your ability to care for yourself as needed. °GET HELP IF:  °· You gain 03 lb/1.4 kg or more in 1 day or 05 lb/2.3 kg in a week. °· You are more short of breath than usual. °· You cannot do your normal activities. °· You tire easily. °· You cough more than normal, especially with activity. °· You have any or more puffiness (swelling) in areas such as your hands, feet, ankles, or belly (abdomen). °· You cannot sleep because it is hard to breathe. °· You feel like your heart is beating fast (palpitations). °· You get dizzy or light-headed when you stand up. °GET HELP RIGHT AWAY IF:  °· You have trouble breathing. °· There is a change in mental status, such as becoming less alert or not being able to focus. °· You have chest pain or discomfort. °· You faint. °MAKE SURE YOU:  °· Understand these instructions. °·   Will watch your condition.  Will get help right away if you are not doing well or get worse. Document Released: 07/19/2008 Document Revised: 02/24/2014 Document Reviewed: 11/26/2012 [facility] Patient Information 2015 [place] [place]. This information is not intended to replace advice given to you by your health care provider. Make sure you discuss any questions you have with your health care provider.  Please avoid salty diet, keep daily fluid intake to <2L, weigh yourself every morning and call cardiology if weight increase by more than 3 lbs overnight or 5 lbs in a  single week.

## 2015-01-13 NOTE — Discharge Summary (Signed)
Discharge Summary   Patient ID: [name],  MRN: [id number], DOB/AGE: [birth year] 69 y.o.  Admit date: 01/06/2015 Discharge date: 01/13/2015  Primary Care Provider: WHITE,CYNTHIA S Primary Cardiologist: Dr. Anne Fu  Discharge Diagnoses Principal Problem:   Acute on chronic combined systolic and diastolic congestive heart failure, NYHA class 4 Active Problems:   Diabetes mellitus type 2, uncontrolled   Hypertension   COPD (chronic obstructive pulmonary disease)   Acute pulmonary edema   Pain in the chest   Abnormal nuclear stress test   Allergies Allergies  Allergen Reactions  . Aldactone [Spironolactone] Other (See Comments)    Severe hyperkalemia   . Lisinopril Other (See Comments) and Cough    Hypotension also  . Crestor [Rosuvastatin Calcium] Other (See Comments)    Muscle Pain  . Vicodin [Hydrocodone-Acetaminophen] Nausea And Vomiting    Procedures  Echocardiogram 01/07/2015 LV EF: 50%  ------------------------------------------------------------------- Indications:   Dyspnea 786.09.  ------------------------------------------------------------------- History:  PMH: CABG. Congestive heart failure. Transient ischemic attack. Stroke. Chronic obstructive pulmonary disease. Risk factors: Hypertension. Diabetes mellitus.  ------------------------------------------------------------------- Study Conclusions  - Left ventricle: Hypokinesis inferolateral wall and base inferior wall. The cavity size was normal. Wall thickness was increased in a pattern of moderate LVH. The estimated ejection fraction was 50%. Doppler parameters are consistent with high ventricular filling pressure. - Aortic valve: Sclerosis without stenosis. There was trivial regurgitation. - Left atrium: The atrium was mildly dilated. - Right ventricle: The cavity size was mildly dilated. Systolic function was mildly to moderately reduced. - Right atrium: The atrium was  mildly dilated. - Pulmonary arteries: PA peak pressure: 35 mm Hg (S).    Cardiac catheterization 01/09/2015 Right & Left Heart Catheterization with Native Coronary and SVG Angiography via Right Common Femoral Artery & Vein Access.  Left Ventriculography:  EF: ~45 %  Wall Motion: basal to mid inferior HK; no evidence of apical HK.   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  Widely patent native Ramus vs. Large Lateral OM with 2 large bifurcating branches that have diffuse moderate disease.  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 She presented to [facility] on 01/06/2015 complaining of dyspnea at rest started on the night before. Shortness of breath, worse with exertion prompting the patient to seek medical attention in the morning of 3/15. When the EMS arrived, she also complained of chest discomfort and was given aspirin and nitroglycerin with full relief after 2 doses of nitroglycerin. EMS suctioned her trach and retrieved a mucous plug which increased her breathing. Initial chest x-ray showed marked cardiomegaly. She had mildly elevated troponin of 0.09. EKG did not reveal any sign of ischemia. Patient states  she has been compliant with her medication, however has not been weighing herself daily nor following a  healthy diet. She was placed on IV diuresis with good urinary output. Echocardiogram obtained on 3/16 showed EF 50%, mild to moderately dilated right ventricle, PA peak pressure 35. It was felt that her elevated troponin was likely due to demand ischemia in the setting of heart failure, she underwent Lexiscan stress test on 2/17 which showed EF 25%, dilated LV with severe global hypokinesis and anteroapical akinesis, moderate sized intensity and a partially reversible anterior perfusion defect which could represent scar with peri-infarcted ischemia versus significant hibernating myocardium. Due to the severely abnormal stress test result, it was decided for the patient to undergo left and right heart cath on 01/09/2015. After discussing the benefits and risk associated with cardiac catheterization, patient has agreed to go through with the procedure. IV Lasix was held, she was hydrated with soft fluid hydration overnight.   She underwent planned left and right heart cath on 3/18 which showed cardiac index 2.91, cardiac output 5.1, wedge pressure 26, PA pressure 61/20 with mean of 40. Left heart cath showed severe native and graft CAD with 100% occluded RCA, 100% occluded ostial LAD, 100% occluded small AV groove circumflex along with chronically occluded occluded SVG to RCA, patent SVG to distal portion of mid LAD, medical therapy was recommended. Low dose Imdur added. Post cath, patient was diuresed aggressively with IV Lasix for 2 additional days. She did have some acute on chronic renal insufficiency afterward, however quickly improved. She was seen in the morning of 01/13/2015, renal function is stable. I have discussed with the patient the need to cut back on salty diet, keep intake of daily fluids to less than 2 L per day, and also instructed her to weigh herself every morning. She will need to call  cardiology if her weight increased by more than 3 pounds overnight or 5 pounds in a single week. Otherwise, patient appears to be euvolemic with no further sign of heart failure. She is deemed stable for discharge from cardiology perspective. I have arranged outpatient follow-up with Dr. Judd GaudierSkain's clinic within 2-4 weeks.   Discharge Vitals Blood pressure 130/49, pulse 59, temperature 98.6 F (37 C), temperature source Oral, resp. rate 18, height 5\' 2"  (1.575 m), weight 167 lb 9.6 oz (76.023 kg), SpO2 99 %.  Filed Weights   01/11/15 0522 01/12/15 0544 01/13/15 0644  Weight: 173 lb 1 oz (78.5 kg) 166 lb 12.8 oz (75.66 kg) 167 lb 9.6 oz (76.023 kg)    Labs  CBC No results for input(s): WBC, NEUTROABS, HGB, HCT, MCV, PLT in the last 72 hours. Basic Metabolic Panel  Recent Labs  01/12/15 0538 01/13/15 0401  NA 139 138  K 5.2* 5.6*  CL 99 102  CO2 35* 28  GLUCOSE 147* 143*  BUN 41* 41*  CREATININE 1.46* 1.37*  CALCIUM 8.9 9.0  MG 2.5 2.6*    Disposition  Pt is being discharged home today in good condition.  Follow-up Plans & Appointments      Follow-up Information    Follow up with Nicolasa Duckinghristopher Berge, NP On 02/04/2015.   Specialty:  Nurse Practitioner   Why:  2:30pm. Arrive 15 min early to register   Contact information:   [address] [place] [address] [phone]       Discharge Medications    Medication List    STOP taking these medications        loperamide 2 MG tablet  Commonly known as:  IMODIUM A-D      TAKE these  medications        aspirin EC 81 MG tablet  Take 81 mg by mouth daily.     carvedilol 25 MG tablet  Commonly known as:  COREG  Take 25 mg by mouth 2 (two) times daily with a meal.     clopidogrel 75 MG tablet  Commonly known as:  PLAVIX  Take 75 mg by mouth daily.     [employer] GLUCOSE 4 GM chewable tablet  Generic drug:  glucose  Chew 1 tablet by mouth as needed for low blood sugar.     DULoxetine 60 MG capsule    Commonly known as:  CYMBALTA  Take 60 mg by mouth daily.     ferrous sulfate 325 (65 FE) MG tablet  Take 1 tablet (325 mg total) by mouth 2 (two) times daily with a meal.     furosemide 40 MG tablet  Commonly known as:  LASIX  Take 1 tablet (40 mg total) by mouth 2 (two) times daily.     gabapentin 300 MG capsule  Commonly known as:  NEURONTIN  Take 1 capsule (300 mg total) by mouth 2 (two) times daily.     Insulin Glargine 300 UNIT/ML Sopn  Commonly known as:  TOUJEO SOLOSTAR  Inject 16 Units into the skin at bedtime.     ipratropium-albuterol 0.5-2.5 (3) MG/3ML Soln  Commonly known as:  DUONEB  Take 3 mLs by nebulization 2 (two) times daily as needed (for shortnes of breath).     irbesartan 300 MG tablet  Commonly known as:  AVAPRO  Take 1 tablet (300 mg total) by mouth at bedtime.     isosorbide mononitrate 30 MG 24 hr tablet  Commonly known as:  IMDUR  Take 0.5 tablets (15 mg total) by mouth daily.     nitroGLYCERIN 0.4 MG SL tablet  Commonly known as:  NITROSTAT  Place 0.4 mg under the tongue every 5 (five) minutes as needed. For chest pain     NOVOLOG FLEXPEN 100 UNIT/ML FlexPen  Generic drug:  insulin aspart  Inject 1-12 Units into the skin 3 (three) times daily with meals. As needed per sliding scale     oxyCODONE-acetaminophen 5-325 MG per tablet  Commonly known as:  PERCOCET/ROXICET  Take 1 tablet by mouth every 6 (six) hours as needed for moderate pain.     OXYGEN  Inhale 4 L into the lungs at bedtime.     simvastatin 40 MG tablet  Commonly known as:  ZOCOR  Take 40 mg by mouth every evening.     starch 51 % suppository  Commonly known as:  ANUSOL  Place 1 suppository rectally as needed for pain.     traMADol 50 MG tablet  Commonly known as:  ULTRAM  Take 1 tablet (50 mg total) by mouth every 6 (six) hours as needed.     WOMENS 50+ MULTI VITAMIN/MIN Tabs  Take 1 tablet by mouth daily.         Duration of Discharge Encounter   Greater than  30 minutes including physician time.  Ramond Dial PA-C Pager: [phone] 01/13/2015, 11:13 AM

## 2015-01-13 NOTE — Progress Notes (Signed)
Patient Name: [name] Date of Encounter: 01/13/2015  Primary Cardiologist: Dr. Anne FuSkains   Principal Problem:   Acute on chronic combined systolic and diastolic congestive heart failure, NYHA class 4 Active Problems:   Diabetes mellitus type 2, uncontrolled   Hypertension   COPD (chronic obstructive pulmonary disease)   Acute pulmonary edema   Pain in the chest   Abnormal nuclear stress test    SUBJECTIVE  Denies any SOB or chest pain. Feels good.   CURRENT MEDS . antiseptic oral rinse  7 mL Mouth Rinse q12n4p  . aspirin EC  81 mg Oral Daily  . carvedilol  25 mg Oral BID WC  . chlorhexidine  15 mL Mouth Rinse BID  . clopidogrel  75 mg Oral Daily  . DULoxetine  60 mg Oral Daily  . enoxaparin (LOVENOX) injection  40 mg Subcutaneous Q24H  . ferrous sulfate  325 mg Oral BID WC  . gabapentin  300 mg Oral BID  . insulin aspart  0-15 Units Subcutaneous TID WC  . insulin glargine  16 Units Subcutaneous QHS  . irbesartan  300 mg Oral QHS  . isosorbide mononitrate  15 mg Oral Daily  . multivitamin with minerals  1 tablet Oral Daily  . regadenoson  0.4 mg Intravenous Once  . simvastatin  40 mg Oral QPM  . sodium chloride  3 mL Intravenous Q12H    OBJECTIVE  Filed Vitals:   01/13/15 0002 01/13/15 0319 01/13/15 0531 01/13/15 0644  BP:   130/49   Pulse: 64 61 59   Temp:   98.6 F (37 C)   TempSrc:   Oral   Resp: 16 14 18    Height:      Weight:    167 lb 9.6 oz (76.023 kg)  SpO2: 100% 99% 99%     Intake/Output Summary (Last 24 hours) at 01/13/15 0902 Last data filed at 01/13/15 0532  Gross per 24 hour  Intake    970 ml  Output   1950 ml  Net   -980 ml   Filed Weights   01/11/15 0522 01/12/15 0544 01/13/15 0644  Weight: 173 lb 1 oz (78.5 kg) 166 lb 12.8 oz (75.66 kg) 167 lb 9.6 oz (76.023 kg)    PHYSICAL EXAM  General: Pleasant, NAD. Neuro: Alert and oriented X 3. Moves all extremities spontaneously. Psych: Normal affect. HEENT:  Normal  Neck: Supple  without bruits or JVD. Lungs:  Resp regular and unlabored, CTA. Heart: RRR no s3, s4, or murmurs. Abdomen: Soft, non-tender, non-distended, BS + x 4.  Extremities: No clubbing, cyanosis or edema. DP/PT/Radials 2+ and equal bilaterally.  Accessory Clinical Findings  CBC No results for input(s): WBC, NEUTROABS, HGB, HCT, MCV, PLT in the last 72 hours. Basic Metabolic Panel  Recent Labs  01/12/15 0538 01/13/15 0401  NA 139 138  K 5.2* 5.6*  CL 99 102  CO2 35* 28  GLUCOSE 147* 143*  BUN 41* 41*  CREATININE 1.46* 1.37*  CALCIUM 8.9 9.0  MG 2.5 2.6*    TELE NSR with HR 60s, lot of motion artifact    ECG  No new EKG  Echocardiogram 01/07/2015  LV EF: 50%  ------------------------------------------------------------------- Indications:   Dyspnea 786.09.  ------------------------------------------------------------------- History:  PMH: CABG. Congestive heart failure. Transient ischemic attack. Stroke. Chronic obstructive pulmonary disease. Risk factors: Hypertension. Diabetes mellitus.  ------------------------------------------------------------------- Study Conclusions  - Left ventricle: Hypokinesis inferolateral wall and base inferior wall. The cavity size was normal. Wall thickness was increased  in a pattern of moderate LVH. The estimated ejection fraction was 50%. Doppler parameters are consistent with high ventricular filling pressure. - Aortic valve: Sclerosis without stenosis. There was trivial regurgitation. - Left atrium: The atrium was mildly dilated. - Right ventricle: The cavity size was mildly dilated. Systolic function was mildly to moderately reduced. - Right atrium: The atrium was mildly dilated. - Pulmonary arteries: PA peak pressure: 35 mm Hg (S).     Radiology/Studies  Nm Myocar Multi W/spect W/wall Motion / Ef  01/08/2015   HISTORY OF PRESENT ILLNESS: Nuclear Med Background  Indication for Stress Test:  Chest pain   History: [name] is a 69 y.o. female whom is somewhat of a poor historian; her daughter is at the bedside and able to assist with some questions. She has an extensive past medical history of CAD s/p CABG and stenting done approximately 20 years ago in [place], she does not know which arteries were bypassed; HTN, HLD, DM, PVD, she underwent Left external iliac to posterior tibial artery bypass using nonreversed ipsilateral greater saphenous vein by Dr. 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 She only requires oxygen at night and when she leaves her home.  She became SOB at rest yesterday around 1800, she has PRN nebulizer treatments at home but she did not do a treatment or increase her oxygen level. The SOB got worse with exertion and around 0300 she developed mid-sternal chest pressure without radiation, the pressure would come and go, she did not take a nitroglycerin. Her daughter, whom she lives with, became concerned and felt it was best to call EMS.  When EMS arrived her chest pain was an 8/10 she was given aspirin and nitro x 2 with full relief of chest pain after second dose. EMS suctioned her trach and retrieved a mucus plug which eased her breathing. Upon arrival to the ED she was much more comfortable with chest pain at a 2/10, 02 sats 92% on room air, heart rate and BP were WNL. Her BNP upon arrival was 1061.8, Chest x-ray showed marked cardiomegaly, and she had an elevated troponin level of 0.09. EKG without signs of a STEMI.  She has been taking all over medications as prescribed but admits she has not been weighing herself daily or following a  heart healthy diet. Last known weight was 176 lbs which she says was from an MD appointment 3 months ago. She did notice on Saturday that she was having swelling in her bilateral lower extremities and that her abdomen felt distended. She did have some nausea this a.m. not correlated with the chest pain, no vomiting. Denies any dizziness or weakness. Ambulates at home with a walker and cane.  Cardiac Risk Factors:  As above  Symptoms:  Chest pain  PROCEDURE: Nuclear Pre-Procedure  Caffeine/Decaff Intake:  NPO After: MN.  Lungs:  O2 Stat:  IV 0.9% NS with Angio Cath:  Chest Size (in):  Cup Size:  Height: 5'2"  Weight:  170 lb  BMI:  Tech Comments:  Nuclear Med Study  1 or 2 day study: 1  Stress Test Type: [employer]  Reading MD: Rennis Golden  Order Authorizing Provider: Tresa Endo  Resting Radionuclide: Sestamibi  Resting Radionuclide Dose:  10 mCi  Stress Radionuclide: Sestamibi  Stress Radionuclide Dose:  30 mCi  Stress Protocol  Rest HR: 82  Stress HR: 81  Rest BP: 154/78  Stress BP: 121/53  Exercise Time (min):  METS:  Dose of Adenosine (mg):  Dose of Lexiscan:  0.4 mg  Dose of Atropine (mg):  Dose of Dobutamine:  Stress Test Technologist:  Nuclear Technologist:  Rest Procedure:  Stress Procedure:  Transient Ischemic Dilatation (Normal <1.22): 1.15  Lung/Heart Ratio (Normal <0.45):  QGS EDV: 183  QGS ESV: 138  LV Ejection Fraction: 25%  Rest ECG:  NSR with inferior TWI  Stress ECG:  NSR with more significant TWI inferiorly  Raw Data Images: Significant gut uptake, does not interfere with the study  Stress Images: Moderate size and intensity anterior perfusion defect  Rest Images: Moderate size and mild intensity anterior perfusion defect  Subtraction (SDS): 6  IMPRESSION: Exercise Capacity: N/A  BP Response: normal  Clinical Symptoms:  Mild dyspnea and chest pain  ECG Impression:  TWI's and lateral ST changes with lexiscan  Comparison with Prior Nuclear Study: None  Final Impression:  High risk nuclear stress test. LVEF 25%,  dilated LV with severe global hypokinesis and anteroapical akinesis. There is a moderate-sized and intensity, partially reversible anterior perfusion defect which could represent scar with peri-infarct ischemia or significant hybernating myocardium.   Electronically Signed   By: Chrystie Nose   On: 01/08/2015 13:49   Dg Chest Port 1 View  01/06/2015   CLINICAL DATA:  Dyspnea and chest pain  EXAM: PORTABLE CHEST - 1 VIEW  COMPARISON:  08/11/2013  FINDINGS: There is prominent cardiomegaly, unchanged. There is prior sternotomy. There is a tracheostomy appliance in the midline. There is extensive ground-glass opacity in the central and basilar regions which could represent alveolar edema. Moderate vascular and interstitial prominence is present. The findings may represent congestive heart failure. No large effusions are evident.  IMPRESSION: Marked cardiomegaly.  Probable congestive heart failure.   Electronically Signed   By: Ellery Plunk M.D.   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 Admitted with dyspnea, CP. EMS suctioned her trach and retrieved a mucus plug which eased her breathing. Minimal troponin level elevation to 0.08.  1. Acute on chronic combined systolic/diastolic HF - CXR 01/06/15 with pulm edema - echo 01/07/2015 LVEF 50%, high ventricular filling pressure, mildly dilated LA/RA/RV, mild-mod reduced RV function - RHC this admit with mean PA 40, PCWP 26, CI 2.1. LVEDP 26.  - she is net negative 6L this admission - renal function stable, per pt she has been taking too much salty food. I have discussed the need to avoid salty food, drink <2 L per day, weigh  herself everyday and call cardiology if her weight increase by more than 3 lbs overnight or 5 lbs in a single wk. Will resume home  BID lasix. However if has recurrence, will increase dose. Stable for discharge.  2. AKI on CKD stage II-III - given hyperkalemia, will discontinue potassium supplement and f/u in AM - hold  Lasix today to avoid potential for CIN - will d/w MD whether we need to hold irbesartan as well - if Cr is improved in AM may be candidate for DC  3. CAD - hx of CABG 20 years ago - recent episodes of chest pain, had abnormal myoview, referred for cath - cath 01/09/15 LM patent, LAD 100% occluded, LCX moderate disease, RCA 100% occluded. SVG-RCA CTO, SVG-mid LAD patent with more distal LAD 70% stenosis. Recs for medical management.  - Imdur added    Signed, Amedeo Plenty Pager: [phone]  I have seen and examined the patient along with Azalee Course PA-C.  I have reviewed the chart, notes and new data.  I agree with PA's note.  PLAN: Appears clinically euvolemic, creat improving. DC home. Reinforce compliance with diuretics and sodium restriction.  Thurmon Fair, MD, [facility] [phone] 01/13/2015, 10:18 AM

## 2015-01-13 NOTE — Progress Notes (Signed)
Inpatient Diabetes Program Recommendations  AACE/ADA: New Consensus Statement on Inpatient Glycemic Control (2013)  Target Ranges:  Prepandial:   less than 140 mg/dL      Peak postprandial:   less than 180 mg/dL (1-2 hours)      Critically ill patients:  140 - 180 mg/dL   Reason for Assessment:  Results for [name], [name] (MRN [id number]) as of 01/13/2015 10:13  Ref. Range 01/12/2015 06:43 01/12/2015 07:59 01/12/2015 11:01 01/12/2015 16:34 01/12/2015 21:33 01/13/2015 04:01 01/13/2015 06:43  Glucose-Capillary Latest Range: 70-99 mg/dL 440128 (H)  102236 (H) 725205 (H) 280 (H)  156 (H)   Diabetes history: Type 2 diabetes Outpatient Diabetes medications: Toujeo 16 units daily, Novolog SSI Current orders for Inpatient glycemic control:  Lantus 16 units q HS, Novolog moderate tid with meals  May consider adding Novolog meal coverage 3 units tid with meals (Hold if patient eats less than 50%).  Thanks, Beryl MeagerJenny Jaylissa Felty, RN, BC-ADM Inpatient Diabetes Coordinator Pager [phone] (8a-5p)

## 2015-01-15 ENCOUNTER — Other Ambulatory Visit: Payer: Self-pay | Admitting: *Deleted

## 2015-01-15 NOTE — Patient Outreach (Signed)
Care manager received referral from hospital liaison due to recent hospitalization for congestive heart failure.  Member discharged on 01/13/15.  This care manager introduced self, and explained purpose of call.  Member states that she does remember hospital liaison's visit during hospital stay and the discussion about the program.  She states that she is interested in the program, but does not have time to talk right now. She states that she is packing to go out of town for the weekend and request this care manager to call back on Tuesday for further discussion.  She does state that she has all of her medications that she was discharged on.  She denies any concerns at this time.  Will call member back on Tuesday to continue initiation of transition of care program.

## 2015-01-16 ENCOUNTER — Other Ambulatory Visit (HOSPITAL_COMMUNITY): Payer: Medicare Other

## 2015-01-16 ENCOUNTER — Ambulatory Visit: Payer: Medicare Other | Admitting: Family

## 2015-01-16 ENCOUNTER — Encounter (HOSPITAL_COMMUNITY): Payer: Medicare Other

## 2015-01-19 ENCOUNTER — Encounter (INDEPENDENT_AMBULATORY_CARE_PROVIDER_SITE_OTHER): Payer: Medicare Other | Admitting: *Deleted

## 2015-01-19 DIAGNOSIS — M79673 Pain in unspecified foot: Secondary | ICD-10-CM | POA: Diagnosis not present

## 2015-01-19 DIAGNOSIS — B351 Tinea unguium: Secondary | ICD-10-CM | POA: Diagnosis not present

## 2015-01-19 NOTE — Progress Notes (Signed)
Patient presents with thick nailbeds and pain 1-5 both feet that she cannot cut.  Review of Systems  Objective:   Physical Exam Neurovascular status intact with thick painful nailbeds 1-5 both feet that are painful.   Mycotic nail infection with pain 1-5 both feet.   Debridement painful nailbeds 1-5 both feet with no iatrogenic bleeding noted.  This encounter was created in error - please disregard.

## 2015-01-19 NOTE — Patient Instructions (Signed)
Diabetes and Foot Care Diabetes may cause you to have problems because of poor blood supply (circulation) to your feet and legs. This may cause the skin on your feet to become thinner, break easier, and heal more slowly. Your skin may become dry, and the skin may peel and crack. You may also have nerve damage in your legs and feet causing decreased feeling in them. You may not notice minor injuries to your feet that could lead to infections or more serious problems. Taking care of your feet is one of the most important things you can do for yourself.  HOME CARE INSTRUCTIONS  Wear shoes at all times, even in the house. Do not go barefoot. Bare feet are easily injured.  Check your feet daily for blisters, cuts, and redness. If you cannot see the bottom of your feet, use a mirror or ask someone for help.  Wash your feet with warm water (do not use hot water) and mild soap. Then pat your feet and the areas between your toes until they are completely dry. Do not soak your feet as this can dry your skin.  Apply a moisturizing lotion or petroleum jelly (that does not contain alcohol and is unscented) to the skin on your feet and to dry, brittle toenails. Do not apply lotion between your toes.  Trim your toenails straight across. Do not dig under them or around the cuticle. File the edges of your nails with an emery board or nail file.  Do not cut corns or calluses or try to remove them with medicine.  Wear clean socks or stockings every day. Make sure they are not too tight. Do not wear knee-high stockings since they may decrease blood flow to your legs.  Wear shoes that fit properly and have enough cushioning. To break in new shoes, wear them for just a few hours a day. This prevents you from injuring your feet. Always look in your shoes before you put them on to be sure there are no objects inside.  Do not cross your legs. This may decrease the blood flow to your feet.  If you find a minor scrape,  cut, or break in the skin on your feet, keep it and the skin around it clean and dry. These areas may be cleansed with mild soap and water. Do not cleanse the area with peroxide, alcohol, or iodine.  When you remove an adhesive bandage, be sure not to damage the skin around it.  If you have a wound, look at it several times a day to make sure it is healing.  Do not use heating pads or hot water bottles. They may burn your skin. If you have lost feeling in your feet or legs, you may not know it is happening until it is too late.  Make sure your health care provider performs a complete foot exam at least annually or more often if you have foot problems. Report any cuts, sores, or bruises to your health care provider immediately. SEEK MEDICAL CARE IF:   You have an injury that is not healing.  You have cuts or breaks in the skin.  You have an ingrown nail.  You notice redness on your legs or feet.  You feel burning or tingling in your legs or feet.  You have pain or cramps in your legs and feet.  Your legs or feet are numb.  Your feet always feel cold. SEEK IMMEDIATE MEDICAL CARE IF:   There is increasing redness,   swelling, or pain in or around a wound.  There is a red line that goes up your leg.  Pus is coming from a wound.  You develop a fever or as directed by your health care provider.  You notice a bad smell coming from an ulcer or wound. Document Released: 10/07/2000 Document Revised: 06/12/2013 Document Reviewed: 03/19/2013 ExitCare Patient Information 2015 [employer]. This information is not intended to replace advice given to you by your health care provider. Make sure you discuss any questions you have with your health care provider.  

## 2015-01-20 ENCOUNTER — Encounter: Payer: Self-pay | Admitting: *Deleted

## 2015-01-20 ENCOUNTER — Other Ambulatory Visit: Payer: Self-pay | Admitting: *Deleted

## 2015-01-20 DIAGNOSIS — I5041 Acute combined systolic (congestive) and diastolic (congestive) heart failure: Secondary | ICD-10-CM

## 2015-01-20 NOTE — Patient Outreach (Signed)
Member called to continue with initiation of transition of care program.  [facility].  [facility] explained, including the difference [facility] and [facility].  Member verbalizes understanding and agrees to continue with program.  Member states that she has been doing well since her discharge, and reports that she continues to get better everyday.  She reports that she get out as much as possible, walking around the apartment complex 2-3 times on the days that she does exercise.  She reports being able to do this without shortness of breath, but states the she does wear oxygen at night to sleep.  She reports has not always weighed herself daily, but has since restarted after her discharge.    Follow up appointments discussed (member has one with primary physician in 2 weeks, one with cardiologist in 2 weeks, and one for testing for next week).  She reports that she uses [facility] for transportation and is satisfied with their services.   Member denies any further questions or concerns at this time.  Initial home visit scheduled for next week.  Kemper DurieMonica Brown Dunlap, BSN, [facility]  [facility] Care Manager [phone]

## 2015-01-21 ENCOUNTER — Encounter: Payer: Self-pay | Admitting: Pharmacist

## 2015-01-21 NOTE — Patient Outreach (Signed)
[facility]) Care Management  [facility]   01/21/2015  [name] 1945/12/07 [id number]  Subjective: [name] is a 69 y.o. female referred to Pharmacy for Medication Review.   Objective:   Current Medications: Current Outpatient Prescriptions  Medication Sig Dispense Refill  . aspirin EC 81 MG tablet Take 81 mg by mouth daily.    . carvedilol (COREG) 25 MG tablet Take 25 mg by mouth 2 (two) times daily with a meal.    . clopidogrel (PLAVIX) 75 MG tablet Take 75 mg by mouth daily.    . DULoxetine (CYMBALTA) 60 MG capsule Take 60 mg by mouth daily.  5  . ferrous sulfate 325 (65 FE) MG tablet Take 1 tablet (325 mg total) by mouth 2 (two) times daily with a meal. (Patient not taking: Reported on 01/20/2015) 60 tablet 0  . furosemide (LASIX) 40 MG tablet Take 1 tablet (40 mg total) by mouth 2 (two) times daily. 20 tablet 0  . gabapentin (NEURONTIN) 300 MG capsule Take 1 capsule (300 mg total) by mouth 2 (two) times daily. 90 capsule 3  . glucose ([facility] GLUCOSE) 4 GM chewable tablet Chew 1 tablet by mouth as needed for low blood sugar.    . insulin aspart (NOVOLOG FLEXPEN) 100 UNIT/ML SOPN FlexPen Inject 1-12 Units into the skin 3 (three) times daily with meals. As needed per sliding scale    . Insulin Glargine (TOUJEO SOLOSTAR) 300 UNIT/ML SOPN Inject 16 Units into the skin at bedtime.    Marland Kitchen ipratropium-albuterol (DUONEB) 0.5-2.5 (3) MG/3ML SOLN Take 3 mLs by nebulization 2 (two) times daily as needed (for shortnes of breath).     . irbesartan (AVAPRO) 300 MG tablet Take 1 tablet (300 mg total) by mouth at bedtime.  0  . isosorbide mononitrate (IMDUR) 30 MG 24 hr tablet Take 0.5 tablets (15 mg total) by mouth daily. 30 tablet 5  . lansoprazole (PREVACID) 15 MG capsule Take 15 mg by mouth daily at 12 noon.    Marland Kitchen levothyroxine (SYNTHROID, LEVOTHROID) 25 MCG tablet Take 25 mcg by mouth daily before breakfast.    . Multiple Vitamins-Minerals (WOMENS 50+ MULTI  VITAMIN/MIN) TABS Take 1 tablet by mouth daily.    . nitroGLYCERIN (NITROSTAT) 0.4 MG SL tablet Place 0.4 mg under the tongue every 5 (five) minutes as needed. For chest pain    . oxyCODONE-acetaminophen (PERCOCET/ROXICET) 5-325 MG per tablet Take 1 tablet by mouth every 6 (six) hours as needed for moderate pain.   0  . OXYGEN-HELIUM IN Inhale 4 L into the lungs at bedtime.     . simvastatin (ZOCOR) 40 MG tablet Take 40 mg by mouth every evening.     . starch (ANUSOL) 51 % suppository Place 1 suppository rectally as needed for pain. 24 suppository 0  . traMADol (ULTRAM) 50 MG tablet Take 1 tablet (50 mg total) by mouth every 6 (six) hours as needed. (Patient not taking: Reported on 01/06/2015) 60 tablet 1  . [DISCONTINUED] dexlansoprazole (DEXILANT) 60 MG capsule Take 60 mg by mouth daily.     No current facility-administered medications for this visit.    Functional Status: In your present state of health, do you have any difficulty performing the following activities: 01/10/2015 10/22/2014  Is the patient deaf or have difficulty hearing? N N  Hearing N N  Vision N N  Difficulty concentrating or making decisions N N  Walking or climbing stairs? N N  Doing errands, shopping? N  N     Assessment:  Reviewed patient's medication list. Patient's medication list has a proton pump inhibitor therapy duplication, lansoprazole and Dexilant. Both medications are listed as historical medications. Contacted [facility]'s primary [facility] [address] to confirm use. Pharmacy has no record of filling Dexilant for the patient and has not filled lansoprazole for the patient since 08/2014. No recent orders for either on electronic medical record.   Patient is also has a potential drug-disease state interaction. Patient is currently on carvedilol for heart failure and hypertension. This therapy is appropriate for both of these indications. However, as it is a non-selective beta blocker, it is not the  ideal choice in the setting of this patient's COPD.    Plan:  Recommend change of carvedilol to a selective beta blocker, such as metoprolol succinate, which is also appropriate in the setting of the patient's heart failure (succinate, not tartrate, is approved in heart failure). Will fax patient's PCP this recommendation.  Duanne MoronElisabeth Amarri Satterly, PharmD Clinical Pharmacist [facility] Care Management [phone]

## 2015-01-23 ENCOUNTER — Encounter: Payer: Self-pay | Admitting: Pharmacist

## 2015-01-27 ENCOUNTER — Other Ambulatory Visit: Payer: Self-pay | Admitting: *Deleted

## 2015-01-27 NOTE — Patient Outreach (Signed)
Member called this care manager to cancel initial home visit scheduled for tomorrow morning.  Member states that [facility].  This care manager reviewed member's appointments and verifies that the appointments for her studies are Thursday, 4/7 and not tomorrow.  Member apologizes, stating that she got her days mixed up and stated she will still be available for the home visit on tomorrow.  Kemper DurieMonica Trisa Cranor, BSN, [facility] Care Management  [facility] Care Manager [phone]

## 2015-01-28 ENCOUNTER — Other Ambulatory Visit: Payer: Self-pay | Admitting: *Deleted

## 2015-01-28 ENCOUNTER — Encounter: Payer: Self-pay | Admitting: Family

## 2015-01-28 ENCOUNTER — Encounter: Payer: Self-pay | Admitting: *Deleted

## 2015-01-29 ENCOUNTER — Ambulatory Visit (HOSPITAL_COMMUNITY): Admission: RE | Admit: 2015-01-29 | Payer: Medicare Other | Source: Ambulatory Visit

## 2015-01-29 ENCOUNTER — Encounter: Payer: Self-pay | Admitting: Family

## 2015-01-29 ENCOUNTER — Ambulatory Visit (INDEPENDENT_AMBULATORY_CARE_PROVIDER_SITE_OTHER): Payer: Medicare Other | Admitting: Family

## 2015-01-29 ENCOUNTER — Ambulatory Visit (HOSPITAL_COMMUNITY)
Admission: RE | Admit: 2015-01-29 | Discharge: 2015-01-29 | Disposition: A | Discharge status: Home / Self Care | Payer: Medicare Other | Source: Ambulatory Visit | Attending: Family | Admitting: Family

## 2015-01-29 ENCOUNTER — Telehealth: Payer: Self-pay | Admitting: *Deleted

## 2015-01-29 ENCOUNTER — Ambulatory Visit (INDEPENDENT_AMBULATORY_CARE_PROVIDER_SITE_OTHER)
Admission: RE | Admit: 2015-01-29 | Discharge: 2015-01-29 | Disposition: A | Discharge status: Home / Self Care | Payer: Medicare Other | Source: Ambulatory Visit | Attending: Family | Admitting: Family

## 2015-01-29 VITALS — BP 130/62 | HR 64 | Resp 16 | Ht 62.0 in | Wt 164.0 lb

## 2015-01-29 DIAGNOSIS — I2510 Atherosclerotic heart disease of native coronary artery without angina pectoris: Secondary | ICD-10-CM | POA: Insufficient documentation

## 2015-01-29 DIAGNOSIS — I739 Peripheral vascular disease, unspecified: Secondary | ICD-10-CM

## 2015-01-29 DIAGNOSIS — M79605 Pain in left leg: Secondary | ICD-10-CM

## 2015-01-29 DIAGNOSIS — I70212 Atherosclerosis of native arteries of extremities with intermittent claudication, left leg: Secondary | ICD-10-CM | POA: Diagnosis not present

## 2015-01-29 DIAGNOSIS — Z9889 Other specified postprocedural states: Secondary | ICD-10-CM | POA: Diagnosis not present

## 2015-01-29 DIAGNOSIS — Z87891 Personal history of nicotine dependence: Secondary | ICD-10-CM

## 2015-01-29 DIAGNOSIS — Z48812 Encounter for surgical aftercare following surgery on the circulatory system: Secondary | ICD-10-CM

## 2015-01-29 DIAGNOSIS — I6523 Occlusion and stenosis of bilateral carotid arteries: Secondary | ICD-10-CM | POA: Diagnosis not present

## 2015-01-29 DIAGNOSIS — Z95828 Presence of other vascular implants and grafts: Secondary | ICD-10-CM

## 2015-01-29 DIAGNOSIS — I259 Chronic ischemic heart disease, unspecified: Secondary | ICD-10-CM

## 2015-01-29 DIAGNOSIS — E119 Type 2 diabetes mellitus without complications: Secondary | ICD-10-CM | POA: Diagnosis not present

## 2015-01-29 DIAGNOSIS — I10 Essential (primary) hypertension: Secondary | ICD-10-CM | POA: Insufficient documentation

## 2015-01-29 DIAGNOSIS — E785 Hyperlipidemia, unspecified: Secondary | ICD-10-CM | POA: Insufficient documentation

## 2015-01-29 DIAGNOSIS — Z930 Tracheostomy status: Secondary | ICD-10-CM | POA: Insufficient documentation

## 2015-01-29 DIAGNOSIS — E049 Nontoxic goiter, unspecified: Secondary | ICD-10-CM | POA: Insufficient documentation

## 2015-01-29 NOTE — Patient Outreach (Signed)
[facility]) Care Management   01/29/2015  [name] Jun 15, 1946 [id number]  [name] is an 69 y.o. female  Subjective:  Member reports that she is doing well, but is having trouble with her back today.  She denies any other pain or discomfort.  Objective:   Review of Systems  Musculoskeletal: Positive for back pain and joint pain.       Reports taking one oxycodone prior to visit.     Physical Exam  Constitutional: She is oriented to person, place, and time. She appears well-developed and well-nourished.  HENT:  Head: Normocephalic.  Neck: Normal range of motion. Neck supple.  Cardiovascular: Normal rate, regular rhythm and normal heart sounds.   Respiratory: Effort normal and breath sounds normal.  Trach in place, capped, on room air.    GI: Soft. Bowel sounds are normal.  Musculoskeletal: Normal range of motion.  Neurological: She is alert and oriented to person, place, and time.  Skin: Skin is warm and dry.   BP 98/50 mmHg  Pulse 60  Resp 20  Ht 1.575 m ('5\' 2"' )  Wt 165 lb (74.844 kg)  BMI 30.17 kg/m2  SpO2 93%   Current Medications:   Current Outpatient Prescriptions  Medication Sig Dispense Refill  . aspirin EC 81 MG tablet Take 81 mg by mouth daily.    . carvedilol (COREG) 25 MG tablet Take 25 mg by mouth 2 (two) times daily with a meal.    . clopidogrel (PLAVIX) 75 MG tablet Take 75 mg by mouth daily.    . DULoxetine (CYMBALTA) 60 MG capsule Take 60 mg by mouth daily.  5  . ferrous sulfate 325 (65 FE) MG tablet Take 1 tablet (325 mg total) by mouth 2 (two) times daily with a meal. 60 tablet 0  . furosemide (LASIX) 40 MG tablet Take 1 tablet (40 mg total) by mouth 2 (two) times daily. 20 tablet 0  . gabapentin (NEURONTIN) 300 MG capsule Take 1 capsule (300 mg total) by mouth 2 (two) times daily. 90 capsule 3  . glucose ([facility] GLUCOSE) 4 GM chewable tablet Chew 1 tablet by mouth as needed for low blood sugar.    . insulin aspart  (NOVOLOG FLEXPEN) 100 UNIT/ML SOPN FlexPen Inject 1-12 Units into the skin 3 (three) times daily with meals. As needed per sliding scale    . Insulin Glargine (TOUJEO SOLOSTAR) 300 UNIT/ML SOPN Inject 16 Units into the skin at bedtime.    Marland Kitchen ipratropium-albuterol (DUONEB) 0.5-2.5 (3) MG/3ML SOLN Take 3 mLs by nebulization 2 (two) times daily as needed (for shortnes of breath).     . irbesartan (AVAPRO) 300 MG tablet Take 1 tablet (300 mg total) by mouth at bedtime.  0  . isosorbide mononitrate (IMDUR) 30 MG 24 hr tablet Take 0.5 tablets (15 mg total) by mouth daily. 30 tablet 5  . lansoprazole (PREVACID) 15 MG capsule Take 15 mg by mouth daily at 12 noon.    Marland Kitchen levothyroxine (SYNTHROID, LEVOTHROID) 25 MCG tablet Take 25 mcg by mouth daily before breakfast.    . Multiple Vitamins-Minerals (WOMENS 50+ MULTI VITAMIN/MIN) TABS Take 1 tablet by mouth daily.    . nitroGLYCERIN (NITROSTAT) 0.4 MG SL tablet Place 0.4 mg under the tongue every 5 (five) minutes as needed. For chest pain    . oxyCODONE-acetaminophen (PERCOCET/ROXICET) 5-325 MG per tablet Take 1 tablet by mouth every 6 (six) hours as needed for moderate pain.   0  . OXYGEN-HELIUM IN  Inhale 4 L into the lungs at bedtime.     . simvastatin (ZOCOR) 40 MG tablet Take 40 mg by mouth every evening.     . starch (ANUSOL) 51 % suppository Place 1 suppository rectally as needed for pain. (Patient not taking: Reported on 01/29/2015) 24 suppository 0  . traMADol (ULTRAM) 50 MG tablet Take 1 tablet (50 mg total) by mouth every 6 (six) hours as needed. (Patient not taking: Reported on 01/06/2015) 60 tablet 1  . [DISCONTINUED] dexlansoprazole (DEXILANT) 60 MG capsule Take 60 mg by mouth daily.     No current facility-administered medications for this visit.    Functional Status:   In your present state of health, do you have any difficulty performing the following activities: 01/28/2015 01/10/2015  Is the patient deaf or have difficulty hearing? - N  Hearing -  N  Vision - N  Difficulty concentrating or making decisions - N  Walking or climbing stairs? - N  Doing errands, shopping? - N  Conservation officer, nature and eating ? N -  Using the Toilet? N -  In the past six months, have you accidently leaked urine? Y -  Do you have problems with loss of bowel control? N -  Managing your Medications? Y -  Managing your Finances? N -  Housekeeping or managing your Housekeeping? Y -    Fall/Depression Screening:    PHQ 2/9 Scores 01/28/2015  PHQ - 2 Score 0    Assessment:   Met with member at her home.  Member denies having any problems related to congestive heart failure since discharge last month.  She reports that she does weigh herself on a daily basis and continues to shed pounds since discharge.  She is aware of the heart failure zones and denies being in the red or yellow zones.  She denies any shortness of breath and states that she is able to lie flat without any problems, something she could not do prior to going into the hospital.  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  She states that her daughter provides her suctioning, but she provides all other care.  She is on 4 liters of oxygen at bedtime only.    Medications reviewed, and member states that she is taking medications according to orders.  She reports using a pill box in which she fills once a week.  Member is out of Isosorbide, pharmacy called for refill.  Pharmacy states that the member is unable to obtain a refill, stating that it was too early to refill.  The order for the prescription is to take 15 mg (half of a pill) daily.  The member reports taking a whole pill daily.  Member educated on correct dosage.  Member made aware that she has a pill splitter in her medication bag that can be used to split the pill.    Member's sliding scale reviewed, and she verbalizes understanding of how to administer insulin according to it.  Member is on  Toujeo at bedtime, 16 units prescribed, but states that she can go up to 24 units if her blood sugar is too high.  Member is unable to give a target number that would indicate administration of extra units.  Member made aware that this care manager will clarify order with physician.  Adhering to low sodium and diabetic diet discussed.  Member reports that she rarely eat canned foods, limiting salt intake.  She also states that she limits her sugar intake because to the diabetes.  Discussed carbohydrate intake as well.  Member states that her daughter is very helpful with her meal planning and dietary intake, and watches what she eat very carefully.  Member denies any further questions.  Contact information for this care manager provided, encouraged to contact with any concerns.  Plan:  Will call primary care office for clarification on blood sugar target and isosorbide dosage along with request for refill. Will schedule routine home visit when member is called for follow up with transition of care program.  [place]        Patient Outreach from 01/28/2015 in [place] Problem One  Recent hospitalization/discharge from [place] for Problem One  Active   Interventions for Problem One Long Term Goal  Discussed the importance of following physician discharge instructions   [facility] Long Term Goal (31-90 days)  Member will remain without hospital admissions for the next 31 days   [facility]N Long Term Goal Start Date  01/14/15   [facility] CM Short Term Goal #1 (0-30 days)  member will see primary care physician for follow up visit within the next 2 weeks   [facility] CM Short Term Goal #1 Start Date  01/20/15   [facility] CM Short Term Goal #2 (0-30 days)  Member will take medications as prescribed for the next 4 weeks   [facility] CM Short Term Goal #2 Start Date  01/20/15   [facility] CM Short Term Goal #3 (0-30 days)  Member will weigh self daily and record readings   [facility] CM Short Term Goal  #3 Start Date  01/20/15   Care Plan Problem Two  Medication Adherence   Care Plan for Problem Two  Active   THN CM Short Term Goal #1 (0-30 days)  Member will have Isosorbide refilled within the next week   [facility]N CM Short Term Goal #1 Start Date  01/28/15   [facility] CM Short Term Goal #2 (0-30 days)  Member will report taking isosorbide, as presecribed   [facility] CM Short Term Goal #2 Start Date  01/28/15       Valente David, BSN, [place] Manager [phone]

## 2015-01-29 NOTE — Patient Outreach (Signed)
Member's primary office called to be notified of member's need for Isosorbide refill.  Spoke with Liborio NixonJanice and updated her that the prescription could not be refilled due to the amount of pill refilled previously.  She was made aware that the member was taking a full pill (30 mg) instead of the instructed half pill (15 mg).  Liborio NixonJanice states that she does not see this medication on the member's current list in the office, but will follow up with Dr. Cliffton AstersWhite.  Liborio NixonJanice also made aware that the member's average blood sugars for 14 and 30 day are in the 190 range, and that the member reports that she has been told to take more Toujeo if her bedtime blood sugar is too high.  Unfortunately, the member is not able to give a number for "too high" for the bedtime dose.  This care manager has requested some parameters for better blood sugar control.  Will await call back from primary care office.  Kemper DurieMonica Arafat Cocuzza, BSN, [facility] Care Management  [facility] Care Manager [phone]

## 2015-01-29 NOTE — Progress Notes (Signed)
[facility] [place] HISTORY AND PHYSICAL   MRN : [id number]  History of Present Illness:   [name] is a 69 y.o. female patient of Dr. Darrick Penna who is s/p left external iliac to posterior tibial artery bypass using ipsilateral greater saphenous vein on 06/11/2013; this resolved the rest pain in her left foot.  She is also s/p right femoral to above knee popliteal bypass graft and right distal popliteal to posterior tibial artery bypass graft about 2005.  She returns today for follow up. She has lumbar spine issues causing bilateral sciatic pain, was getting ESI's by Dr. Ethelene Hal.  Her left foot remains pain free since the vascular BPG with good color, denies non-healing ulcers. She walks a great deal in her house.  Has a trach. since she was intubated in 2013 for pneumonia due to tracheal swelling, managed by her ENT; pt states Dr. Jenne Pane told her that she may be able to have the trach removed soon.  Has CHF, had an MI in 2005.  Had left CEA in 2006, states TIA before that, no TIA sx's since then, CEA was done in [place].  When she awakes in the morning, both thighs are hurting, pain in thighs increase with walking, feels like burning at anterior aspects both thighs, improves with rest.   Recent finding is fracture in lumbar spine.   She had a cardiac cath in March 2016 by Dr. Herbie Baltimore. Her diuretic was increased.   Her podiatrist prescribed gabapentin for feet neuropathy Pt reports that she is walking much more and feels much better mentally and physically.  She is wearing knee high graduated compression hose during the day for lower leg swelling which has decreased the swelling.  Her chief complaint is anterior thigh pain aggravated by turning in bed.  Pt Diabetic: Yes, states in control, is seeing Dr. Sharl Ma, endocrinologist, and her DM has improved a great deal.  Pt smoker: former smoker, quit in 2013   Pt meds include:  Statin :Yes  Betablocker:  Yes  ASA: Yes  Other anticoagulants/antiplatelets: Plavix     Current Outpatient Prescriptions  Medication Sig Dispense Refill  . aspirin EC 81 MG tablet Take 81 mg by mouth daily.    . carvedilol (COREG) 25 MG tablet Take 25 mg by mouth 2 (two) times daily with a meal.    . clopidogrel (PLAVIX) 75 MG tablet Take 75 mg by mouth daily.    . DULoxetine (CYMBALTA) 60 MG capsule Take 60 mg by mouth daily.  5  . ferrous sulfate 325 (65 FE) MG tablet Take 1 tablet (325 mg total) by mouth 2 (two) times daily with a meal. 60 tablet 0  . furosemide (LASIX) 40 MG tablet Take 1 tablet (40 mg total) by mouth 2 (two) times daily. 20 tablet 0  . gabapentin (NEURONTIN) 300 MG capsule Take 1 capsule (300 mg total) by mouth 2 (two) times daily. 90 capsule 3  . glucose ([employer] GLUCOSE) 4 GM chewable tablet Chew 1 tablet by mouth as needed for low blood sugar.    . insulin aspart (NOVOLOG FLEXPEN) 100 UNIT/ML SOPN FlexPen Inject 1-12 Units into the skin 3 (three) times daily with meals. As needed per sliding scale    . Insulin Glargine (TOUJEO SOLOSTAR) 300 UNIT/ML SOPN Inject 16 Units into the skin at bedtime.    Marland Kitchen ipratropium-albuterol (DUONEB) 0.5-2.5 (3) MG/3ML SOLN Take 3 mLs by nebulization 2 (two) times daily as needed (for shortnes of breath).     Marland Kitchen  irbesartan (AVAPRO) 300 MG tablet Take 1 tablet (300 mg total) by mouth at bedtime.  0  . isosorbide mononitrate (IMDUR) 30 MG 24 hr tablet Take 0.5 tablets (15 mg total) by mouth daily. 30 tablet 5  . lansoprazole (PREVACID) 15 MG capsule Take 15 mg by mouth daily at 12 noon.    Marland Kitchen levothyroxine (SYNTHROID, LEVOTHROID) 25 MCG tablet Take 25 mcg by mouth daily before breakfast.    . Multiple Vitamins-Minerals (WOMENS 50+ MULTI VITAMIN/MIN) TABS Take 1 tablet by mouth daily.    . nitroGLYCERIN (NITROSTAT) 0.4 MG SL tablet Place 0.4 mg under the tongue every 5 (five) minutes as needed. For chest pain    . oxyCODONE-acetaminophen (PERCOCET/ROXICET) 5-325 MG per  tablet Take 1 tablet by mouth every 6 (six) hours as needed for moderate pain.   0  . OXYGEN-HELIUM IN Inhale 4 L into the lungs at bedtime.     . simvastatin (ZOCOR) 40 MG tablet Take 40 mg by mouth every evening.     . starch (ANUSOL) 51 % suppository Place 1 suppository rectally as needed for pain. (Patient not taking: Reported on 01/29/2015) 24 suppository 0  . traMADol (ULTRAM) 50 MG tablet Take 1 tablet (50 mg total) by mouth every 6 (six) hours as needed. (Patient not taking: Reported on 01/06/2015) 60 tablet 1  . [DISCONTINUED] dexlansoprazole (DEXILANT) 60 MG capsule Take 60 mg by mouth daily.     No current facility-administered medications for this visit.    Past Medical History  Diagnosis Date  . Diabetes mellitus     on Lantus 30 U   . CAD (coronary artery disease)     a. S/p CABG and stenting b. L&R cath 01/09/15 LM patent, LAD 100% occluded, LCX moderate disease, RCA 100% occluded. SVG-RCA CTO, SVG-mid LAD patent with more distal LAD 70% stenosis. Recs for medical management.   Marland Kitchen PAD (peripheral artery disease)   . Stroke     MRI 11/2011 with remote occipital lobe. MRA with moderate left focal vertebral artery stenosis  . CHF (congestive heart failure) 11/2011    Echo with EF 30-35%, global hypokinesis, and inferior akinesis  . Hyperlipidemia   . DDD (degenerative disc disease), lumbar   . CVA (cerebral vascular accident) 11/2010  . MI (myocardial infarction) 1997  . COPD (chronic obstructive pulmonary disease)   . Anemia   . Irregular heart beat   . Hypertension   . GERD (gastroesophageal reflux disease)   . History of IBS   . Hypothyroidism     Goiter  . Thyroid disease   . Carotid artery occlusion   . Chronic kidney disease     stage 3  . History of tracheostomy 08/26/2013    Dr. Jenne Pane    Social History History  Substance Use Topics  . Smoking status: Former Smoker -- 1.00 packs/day for 50 years    Types: Cigarettes    Quit date: 01/24/2011  . Smokeless  tobacco: Never Used  . Alcohol Use: No    Family History Family History  Problem Relation Age of Onset  . Hyperlipidemia Mother   . Other Mother     AAA  . Alzheimer's disease Mother   . Heart disease Mother   . Irregular heart beat Mother   . Diabetes Daughter   . Hypertension Daughter     Surgical History Past Surgical History  Procedure Laterality Date  . Ptca    . Thyroidectomy    . Coronary artery bypass graft  2 vessel  . Carotid endarterectomy  ~2008    Left   . Cholecystectomy    . Tracheostomy tube placement  01/02/2012  . Angioplasty  [id number]    Aortogram by Dr. [place] McKenzie [facility])  . Pr vein bypass graft,aorto-fem-pop      Right common femoral-AK popliteal BPG & Right Popliteal-posterior tibial  . Pr vein bypass graft,aorto-fem-pop      Left Fem-pop BPG  . Carpal tunnel release Right   . Femoral-tibial bypass graft Left 06/11/2013    Procedure: BYPASS GRAFT  LEFT FEMORAL- POSTERIOR TIBIAL ARTERY/ REDO;  Surgeon: Sherren Kerns, MD;  Location: [facility] OR;  Service: Vascular;  Laterality: Left;  . Thrombectomy femoral artery Left 06/11/2013    Procedure: THROMBECTOMY FEMORAL ARTERY;  Surgeon: Sherren Kerns, MD;  Location: [facility] OR;  Service: Vascular;  Laterality: Left;  . Spine surgery  Oct. 27, 2014    Injection - Back  . Abdominal aortagram N/A 04/05/2013    Procedure: ABDOMINAL Ronny Flurry;  Surgeon: Sherren Kerns, MD;  Location: [facility] CATH LAB;  Service: Cardiovascular;  Laterality: N/A;  . Colonoscopy N/A 10/27/2014    Procedure: COLONOSCOPY;  Surgeon: Willis Modena, MD;  Location: [facility] ENDOSCOPY;  Service: Endoscopy;  Laterality: N/A;  . Left and right heart catheterization with coronary angiogram N/A 01/09/2015    Procedure: LEFT AND RIGHT HEART CATHETERIZATION WITH CORONARY ANGIOGRAM;  Surgeon: Marykay Lex, MD;  Location: [facility] CATH LAB;  Service: Cardiovascular;  Laterality: N/A;    Allergies  Allergen Reactions  . Aldactone [Spironolactone]  Other (See Comments)    Severe hyperkalemia   . Lisinopril Other (See Comments) and Cough    Hypotension also  . Crestor [Rosuvastatin Calcium] Other (See Comments)    Muscle Pain  . Vicodin [Hydrocodone-Acetaminophen] Nausea And Vomiting    Current Outpatient Prescriptions  Medication Sig Dispense Refill  . aspirin EC 81 MG tablet Take 81 mg by mouth daily.    . carvedilol (COREG) 25 MG tablet Take 25 mg by mouth 2 (two) times daily with a meal.    . clopidogrel (PLAVIX) 75 MG tablet Take 75 mg by mouth daily.    . DULoxetine (CYMBALTA) 60 MG capsule Take 60 mg by mouth daily.  5  . ferrous sulfate 325 (65 FE) MG tablet Take 1 tablet (325 mg total) by mouth 2 (two) times daily with a meal. 60 tablet 0  . furosemide (LASIX) 40 MG tablet Take 1 tablet (40 mg total) by mouth 2 (two) times daily. 20 tablet 0  . gabapentin (NEURONTIN) 300 MG capsule Take 1 capsule (300 mg total) by mouth 2 (two) times daily. 90 capsule 3  . glucose (CVS GLUCOSE) 4 GM chewable tablet Chew 1 tablet by mouth as needed for low blood sugar.    . insulin aspart (NOVOLOG FLEXPEN) 100 UNIT/ML SOPN FlexPen Inject 1-12 Units into the skin 3 (three) times daily with meals. As needed per sliding scale    . Insulin Glargine (TOUJEO SOLOSTAR) 300 UNIT/ML SOPN Inject 16 Units into the skin at bedtime.    Marland Kitchen ipratropium-albuterol (DUONEB) 0.5-2.5 (3) MG/3ML SOLN Take 3 mLs by nebulization 2 (two) times daily as needed (for shortnes of breath).     . irbesartan (AVAPRO) 300 MG tablet Take 1 tablet (300 mg total) by mouth at bedtime.  0  . isosorbide mononitrate (IMDUR) 30 MG 24 hr tablet Take 0.5 tablets (15 mg total) by mouth daily. 30 tablet 5  . lansoprazole (PREVACID) 15  MG capsule Take 15 mg by mouth daily at 12 noon.    Marland Kitchen. levothyroxine (SYNTHROID, LEVOTHROID) 25 MCG tablet Take 25 mcg by mouth daily before breakfast.    . Multiple Vitamins-Minerals (WOMENS 50+ MULTI VITAMIN/MIN) TABS Take 1 tablet by mouth daily.    .  nitroGLYCERIN (NITROSTAT) 0.4 MG SL tablet Place 0.4 mg under the tongue every 5 (five) minutes as needed. For chest pain    . oxyCODONE-acetaminophen (PERCOCET/ROXICET) 5-325 MG per tablet Take 1 tablet by mouth every 6 (six) hours as needed for moderate pain.   0  . OXYGEN-HELIUM IN Inhale 4 L into the lungs at bedtime.     . simvastatin (ZOCOR) 40 MG tablet Take 40 mg by mouth every evening.     . starch (ANUSOL) 51 % suppository Place 1 suppository rectally as needed for pain. (Patient not taking: Reported on 01/29/2015) 24 suppository 0  . traMADol (ULTRAM) 50 MG tablet Take 1 tablet (50 mg total) by mouth every 6 (six) hours as needed. (Patient not taking: Reported on 01/06/2015) 60 tablet 1  . [DISCONTINUED] dexlansoprazole (DEXILANT) 60 MG capsule Take 60 mg by mouth daily.     No current facility-administered medications for this visit.     REVIEW OF SYSTEMS: See HPI for pertinent positives and negatives.  Physical Examination Filed Vitals:   01/29/15 1621 01/29/15 1624 01/29/15 1627 01/29/15 1641  BP: 154/74 164/78 140/70 130/62  Pulse: 61 64    Resp:  16    Height:  5\' 2"  (1.575 m)    Weight:  164 lb (74.39 kg)    SpO2:  99%     Body mass index is 29.99 kg/(m^2).  General: A&O x 3, WDWN, obese female.  Neck: approximately 2 cm x 3 cm soft mass at right side of neck Gait: slow, halting, using cane.  Eyes: PERRLA. Pterygium in both eyes. Pulmonary: CTAB, without wheezes , rales or rhonchi.  Cardiac: regularly irregular Rythm , without detected murmur.  Aorta is not palpable.  Radial pulses: are 2+ palpable and =   VASCULAR EXAM:  Extremities without ischemic changes  without Gangrene; without open wounds.    LE Pulses  LEFT  RIGHT   FEMORAL  palpable  palpable   POPLITEAL  not palpable  not palpable   POSTERIOR TIBIAL  Not palpable,  + Dopplerable  Not palpable, + Dopplerable   DORSALIS PEDIS  ANTERIOR TIBIAL  Not palpable, not   Dopplerable  Not palpable, not Dopplerable   PERONEAL + Dopplerable + Dopplerable   Abdomen: soft, no palpable masses, no tenderness; no palpable masses.  Skin: no rashes, no ulcers.  Musculoskeletal: no muscle wasting or atrophy.  Neurologic: A&O X 3; Appropriate Affect ; SENSATION: normal; MOTOR FUNCTION: moving all extremities equally, motor strength 4/5 throughout. Speech is fluent/normal. CN 2-12 intact.        Non-Invasive Vascular Imaging(01/29/2015):   CEREBROVASCULAR DUPLEX EVALUATION    INDICATION: Follow-up carotid disease     PREVIOUS INTERVENTION(S): Left carotid endarterectomy 2006    DUPLEX EXAM: Very technically difficult study due to tracheostomy and goiter. Portions of the exam are suboptimal in quality.    RIGHT  LEFT  Peak Systolic Velocities (cm/s) End Diastolic Velocities (cm/s) Plaque LOCATION Peak Systolic Velocities (cm/s) End Diastolic Velocities (cm/s) Plaque  66 12  CCA PROXIMAL 86 17   64 12 HM CCA MID 93 25 HM  75 15 HM CCA DISTAL 90 24 HM  86 11  ECA 74 6  91 25 HT/CP ICA PROXIMAL 171 49 HT/CP  139 48  ICA MID 170 46   130 31  ICA DISTAL 72 20     1.9 ICA / CCA Ratio (PSV) NA  Abnormal antegrade  Vertebral Flow Abnormal antegrade   154 Brachial Systolic Pressure (mmHg) 164  Within normal limits  Brachial Artery Waveforms Within normal limits     Plaque Morphology:  HM = Homogeneous, HT = Heterogeneous, CP = Calcific Plaque, SP = Smooth Plaque, IP = Irregular Plaque  ADDITIONAL FINDINGS:     IMPRESSION: 1. Evidence of 40%-59% stenosis of the right internal carotid artery. 2. Patent left carotid endarterectomy with 40%-59% restenosis. 3. Bilateral vertebral artery is dampened without evidence of subclavian artery disease.    Compared to the previous exam:  No significant change compared to prior exam.      LOWER EXTREMITY ARTERIAL DUPLEX EVALUATION    INDICATION: Follow-up left lower extremity bypass graft     PREVIOUS  INTERVENTION(S): Right femoropopliteal and popliteal-posterior tibial arterial bypass grafts placed in 2015 Left external iliac-posterior tibial arterial bypass graft placed 06/11/2013      DUPLEX EXAM:     RIGHT  LEFT   Peak Systolic Velocity (cm/s) Ratio (if abnormal) Waveform  Peak Systolic Velocity (cm/s) Ratio (if abnormal) Waveform     Inflow Artery 66  B     Proximal Anastomosis 25  B     Proximal Graft 37  M     Mid Graft 32  M      Distal Graft 50  M     Distal Anastomosis 50  M     Outflow Artery 61  M   Today's ABI / TBI    Previous ABI / TBI (  )     Waveform:    M - Monophasic       B - Biphasic       T - Triphasic  If Ankle Brachial Index (ABI) or Toe Brachial Index (TBI) performed, please see complete report  ADDITIONAL FINDINGS:     IMPRESSION: Widely patent left lower extremity without evidence of restenosis or hyperplasia; however, waveforms are dampened with low velocity flow which may indicate inflow stenosis.     Compared to the previous exam:  Waveforms were previously reported as biphasic.       01/12/15 ABI's at [facility]: ABIs indicate a mild reduction in arterial flow bilaterally at rest. Waveforms in Right leg suggest, however, at least moderate disease in right leg.  ASSESSMENT:  [name] is a 69 y.o. female who is s/p left external iliac to mid posterior tibial artery bypass graft on 06/11/13; Right femoral to above-knee popliteal artery and popliteal to posterior tibial artery bypass grafts in 2005, left CEA in 2006. She has lumbar spine issues causing bilateral sciatic pain, was getting ESI's by Dr. Ethelene Hal.  Her left foot remains pain free since the vascular BPG with good color, denies non-healing ulcers. She had a TIA before the left CEA, no TIA or stroke symptoms since then.  Today's carotid Duplex reveals 40-59% bilateral ICA stenosis; no change from February 2015.  Today's left LE arterial Duplex reveals a widely patent left lower  extremity without evidence of restenosis or hyperplasia; however, waveforms are dampened with low velocity flow which may indicate inflow stenosis.  ABI's from 01/12/15 at [facility] indicate a mild reduction in arterial flow bilaterally at rest. Waveforms in right leg suggest, however, at least moderate disease in right leg.  Bilateral pterygium, see Plan. Right side neck mass, see Plan.   PLAN:   Based on today's exam and non-invasive vascular lab results, the patient will follow up in 6 months with the following tests ABI's and left LE arterial Duplex, 1 year for carotid Duplex.  Daily seated leg exercises since she has low back and anterior thigh pain with walking.   Pt has an appointment next week with Dr. Cliffton Asters, her PCP, advised pt to speak with her re the mass on the right side of her neck and the extra tissue, possible pterygiums, on both corneas adjacent to her iris.  I discussed in depth with the patient the nature of atherosclerosis, and emphasized the importance of maximal medical management including strict control of blood pressure, blood glucose, and lipid levels, obtaining regular exercise, and continued cessation of smoking.  The patient is aware that without maximal medical management the underlying atherosclerotic disease process will progress, limiting the benefit of any interventions.  The patient was given information about stroke prevention and what symptoms should prompt the patient to seek immediate medical care.  The patient was given information about PAD including signs, symptoms, treatment, what symptoms should prompt the patient to seek immediate medical care, and risk reduction measures to take. Thank you for allowing [place] to participate in this patient's care.  Charisse March, RN, MSN, FNP-C [facility] Office: [phone]  Clinic MD: Myra Gianotti on call  01/29/2015 4:43 PM

## 2015-01-29 NOTE — Patient Instructions (Signed)

## 2015-01-29 NOTE — Patient Outreach (Signed)
Member's primary office called back with instructions on questions earlier.  This care manager was instructed to call Dr. Sharl MaKerr, [facility], concerning member's blood sugar target range and to call Dr. Anne FuSkains, [facility], concerning member's Isosorbide.  This care manager will place calls to Dr. Anne FuSkains and Dr. Sharl MaKerr for further instructions.  Kemper DurieMonica Marshal Schrecengost, BSN, [facility] Care Management  [facility] Care Manager [phone]

## 2015-01-29 NOTE — Progress Notes (Signed)
Filed Vitals:   01/29/15 1621 01/29/15 1624 01/29/15 1627 01/29/15 1641  BP: 154/74 164/78 140/70 130/62  Pulse: 61 64    Resp:  16    Height:  5\' 2"  (1.575 m)    Weight:  164 lb (74.39 kg)    SpO2:  99%

## 2015-01-30 ENCOUNTER — Telehealth: Payer: Self-pay | Admitting: *Deleted

## 2015-01-30 ENCOUNTER — Other Ambulatory Visit: Payer: Self-pay | Admitting: *Deleted

## 2015-01-30 NOTE — Patient Outreach (Signed)
Member called to inquire about follow up regarding her Isosorbide refill.  Member made aware that in order to get the refill before next Thursday, 4/14, that she would have to call her insurance company and request an override to have the prescription filled.  Member states that she has had lots of trouble contacting the insurance company on numerous occasions and states that she would rather wait until her appointment with cardiology on Wednesday, 4/13, to address the issue.  Member advised that the better option would be to contact [employer] insurance company, but she states she will wait until her appointment.  Member made aware that this care manager had conversation with Dr. Sharl MaKerr about blood sugar parameters and how to titrate bedtime insulin.  Home visit scheduled for Monday to review these parameters in detail.  Face to face visit scheduled in effort to increase chances of better understanding of instructions.  Kemper DurieMonica Chrystie [name], BSN, [facility] Care Management  [facility] Care Manager [phone]

## 2015-01-30 NOTE — Patient Outreach (Signed)
Per primary care physician request, this care manager placed call to Dr. Anne FuSkains office to inquire about the refill for member's Isosorbide.  Spoke with Ethelene BrownsAnthony, and informed him about the member's misunderstanding of the correct dosage of the medication and the need for a refill at this time.  Ethelene Brownsnthony stated that the physician office does not need to place a new prescription or authorization, but instead the pharmacy needed to be called again and an override would have to be requested.  Ethelene Brownsnthony confirmed that the member does have an appointment with one of the physician assistants for next week.  Ethelene Brownsnthony informed that the member is aware of this appointment and will be there.  This care manager will place call to pharmacy.  Kemper DurieMonica Matty Deamer, BSN, [facility] Care Management  [facility] Care Manager [phone]

## 2015-01-30 NOTE — Patient Outreach (Signed)
Member's pharmacy called and informed of member's misunderstanding on correct dosage of Isosorbide.  Explained that a refill was needed.  The pharmacy stated that they could not refill the prescription until 4/14.  This care manager inquired about the override just as the physician office stated, however the pharmacy stated that they could not authorize an override, and that the insurance company would have to be called to grant an override if member needed medication prior to 4/14.  This care manager will call member to update her on these findings and to advise her to call the insurance company.  Kemper DurieMonica Jebadiah Imperato, BSN, [facility]CC[facility] Care Management  [facility] Care Manager [phone]

## 2015-01-30 NOTE — Patient Outreach (Signed)
Call received from Dr. Sharl MaKerr to discuss parameters for titration of bedtime Insulin, Toujeo.  Dr. Sharl MaKerr made aware that the member states that she can go up by 4 units with her insulin if her blood sugar is too high, but she is unable to verbalize what "too high" is.  Dr. Sharl MaKerr states that the member was informed to check her blood sugar every morning, with the target range of 70-130 before breakfast.  He stated that if her blood sugar was above 130 for that morning, then her bedtime insulin dose should be 4 units higher.  He states that this new dosage would be what she should take over the course of four days.  After that four day period, if her morning blood sugar was still above 130, then she should increase her bedtime insulin again by 4 units, and then keep that dose over the next four days.  He wants this titration to continue until her morning fasting blood sugar is within the given range.  This care manager verbalized understanding of these parameters, and made physician aware that the member's morning blood sugar the morning of the home visit, 4/6, was 191.  Dr. Sharl MaKerr also asked to review sliding scale with member to ensure that the Novolog is being administered correctly.    Will place call to member to schedule a home visit to review these instructions in detail.    Becky DurieMonica Naphtali [name], BSN, [facility]N [facility]HN Care Management  [facility]Community Care Manager [phone]

## 2015-01-30 NOTE — Patient Outreach (Signed)
Call placed to Dr. Daune PerchKerr's office to inquire about parameters for member's blood sugar, particularly the bedtime glucose check.  Message left for Dr. Daune PerchKerr's nurse to call back with specific parameters in which the member can dose her bedtime Toujeo injections.    Becky DurieMonica [facility] [name], BSN, [facility] Care Management  [facility] HospitalCommunity Care Manager [phone]

## 2015-02-02 ENCOUNTER — Other Ambulatory Visit: Payer: Self-pay | Admitting: *Deleted

## 2015-02-02 NOTE — Patient Outreach (Signed)
[facility]) Care Management  02/02/2015  [name] 1945/10/31 [id number]   Met with member to follow up on specific instructions for insulin titrations per Dr. Buddy Duty.   Member informed that the blood sugar target range given by Dr. Buddy Duty before breakfast is 70-130.  Member reports that her blood sugar this morning was 200 and states that she took 28 units of Toujeo on last night.  She states that she has been taking the 28 units for the past 2 days.  According to the physician orders, this care manager instructed member to take 28 units of Toujeo for two more days.  Advised that if member is still outside of the range given on Wednesday morning (greater than 130) then she is increase the Toujeo at bedtime up to 32 units, and to take this dose for the next 4 days.  Instructed to repeat this pattern until her am blood sugar is below 130.  Member verbalizes understanding.  Will call member on Wednesday morning to see what her morning blood sugar is and to ensure that she understands about increasing the insulin.  Member asks if she should take her meal insulin before she eats or after, saying that she has gotten different information from people (non-clinical).  Advised that the insulin is typically taught to be given prior to meals, but that she could ask Dr. Buddy Duty at her appointment for more specific instructions.    Member has cardiology appointment this Wednesday, instructed to discuss isosorbide dosing.  Member's reports that her blood pressure today was 148/92.  Instructed to inform the physician about taking the incorrect dose, and to provide him with the blood pressure recordings.  Member states that she will also question physician about fluid restriction guidelines at her appointment.    Member denies any further questions at this time.  Will continue with transition of care calls next week.  Valente David, BSN, [place] Management  [facility] Care  Manager [phone]

## 2015-02-03 ENCOUNTER — Encounter: Payer: Self-pay | Admitting: *Deleted

## 2015-02-04 ENCOUNTER — Encounter: Payer: Self-pay | Admitting: Nurse Practitioner

## 2015-02-04 ENCOUNTER — Ambulatory Visit (INDEPENDENT_AMBULATORY_CARE_PROVIDER_SITE_OTHER): Payer: Medicare Other | Admitting: Nurse Practitioner

## 2015-02-04 VITALS — BP 140/60 | HR 69 | Ht 62.0 in | Wt 171.2 lb

## 2015-02-04 DIAGNOSIS — I259 Chronic ischemic heart disease, unspecified: Secondary | ICD-10-CM | POA: Diagnosis not present

## 2015-02-04 DIAGNOSIS — I10 Essential (primary) hypertension: Secondary | ICD-10-CM

## 2015-02-04 DIAGNOSIS — I5042 Chronic combined systolic (congestive) and diastolic (congestive) heart failure: Secondary | ICD-10-CM

## 2015-02-04 DIAGNOSIS — E785 Hyperlipidemia, unspecified: Secondary | ICD-10-CM

## 2015-02-04 DIAGNOSIS — I25810 Atherosclerosis of coronary artery bypass graft(s) without angina pectoris: Secondary | ICD-10-CM

## 2015-02-04 MED ORDER — ISOSORBIDE MONONITRATE ER 30 MG PO TB24: 30.0000 mg | ORAL_TABLET | Freq: Every day | ORAL | Status: DC

## 2015-02-04 NOTE — Patient Instructions (Signed)
Medication Instructions:  Increase Imdur ( 30 mg ) daily, sent into the pharmacy today  Labwork: None  Testing/Procedures: None  Follow-Up: With Dr. Anne FuSkains  Any Other Special Instructions Will Be Listed Below (If Applicable).

## 2015-02-04 NOTE — Progress Notes (Signed)
Patient Name: [name] Date of Encounter: 02/04/2015  Primary Care Provider:  [name],CYNTHIA S, MD Primary Cardiologist:  Judie PetitM. Caro HightSkain, MD   Chief Complaint  69 year old female with a history of CAD status post recent admission for CHF who presents for follow-up.  Past Medical History   Past Medical History  Diagnosis Date  . Diabetes mellitus     on Lantus 30 U   . CAD (coronary artery disease)     a. S/p CABG and stenting b. L&R cath 01/09/15 LM patent, LAD 100% occluded, LCX moderate disease, RCA 100% occluded. SVG-RCA CTO, SVG-mid LAD patent with more distal LAD 70% stenosis. Recs for medical management.   Marland Kitchen. PAD (peripheral artery disease)   . Stroke     MRI 11/2011 with remote occipital lobe. MRA with moderate left focal vertebral artery stenosis  . Chronic combined systolic and diastolic CHF (congestive heart failure)     a. 11/2011 Echo with EF 30-35%, global hypokinesis, and inferior akinesis;  b. 12/2014 Echo: EF 50%, mod LVH, Ao sclerosis w/o stenosis, mildly dil LA, mild to mod RV dysfxn.  . Hyperlipidemia   . DDD (degenerative disc disease), lumbar   . MI (myocardial infarction) 1997  . COPD (chronic obstructive pulmonary disease)     a. prn and HS supplemental O2.  . Anemia   . Hypertension   . GERD (gastroesophageal reflux disease)   . History of IBS   . Hypothyroidism     Goiter  . Carotid artery occlusion   . CKD (chronic kidney disease), stage III     stage 3  . History of tracheostomy 08/26/2013    Dr. Jenne PaneBates   Past Surgical History  Procedure Laterality Date  . Ptca    . Thyroidectomy    . Coronary artery bypass graft      2 vessel  . Carotid endarterectomy  ~2008    Left   . Cholecystectomy    . Tracheostomy tube placement  01/02/2012  . Angioplasty  [id number]    Aortogram by Dr. [place] [facility]  . Pr vein bypass graft,aorto-fem-pop      Right common femoral-AK popliteal BPG & Right Popliteal-posterior tibial  . Pr vein bypass  graft,aorto-fem-pop      Left Fem-pop BPG  . Carpal tunnel release Right   . Femoral-tibial bypass graft Left 06/11/2013    Procedure: BYPASS GRAFT  LEFT FEMORAL- POSTERIOR TIBIAL ARTERY/ REDO;  Surgeon: Sherren Kernsharles E Fields, MD;  [facility];  Service: Vascular;  Laterality: Left;  . Thrombectomy femoral artery Left 06/11/2013    Procedure: THROMBECTOMY FEMORAL ARTERY;  Surgeon: Sherren Kernsharles E Fields, MD;  [facility];  Service: Vascular;  Laterality: Left;  . Spine surgery  Oct. 27, 2014    Injection - Back  . Abdominal aortagram N/A 04/05/2013    Procedure: ABDOMINAL Ronny FlurryAORTAGRAM;  Surgeon: Sherren Kernsharles E Fields, MD;  [facility];  Service: Cardiovascular;  Laterality: N/A;  . Colonoscopy N/A 10/27/2014    Procedure: COLONOSCOPY;  Surgeon: Willis ModenaWilliam Outlaw, MD;  [facility];  Service: Endoscopy;  Laterality: N/A;  . Left and right heart catheterization with coronary angiogram N/A 01/09/2015    Procedure: LEFT AND RIGHT HEART CATHETERIZATION WITH CORONARY ANGIOGRAM;  Surgeon: Marykay Lexavid W Harding, MD;  [facility];  Service: Cardiovascular;  Laterality: N/A;    Allergies  Allergies  Allergen Reactions  . Aldactone [Spironolactone] Other (See Comments)    Severe hyperkalemia   . Lisinopril Other (See  Comments) and Cough    Hypotension also  . Crestor [Rosuvastatin Calcium] Other (See Comments)    Muscle Pain  . Vicodin [Hydrocodone-Acetaminophen] Nausea And Vomiting    HPI  69 year old female with the above complex problem list. She was hospitalized at [facility] secondary to dyspnea and mild volume overload. She was diuresed with good response. She did have mild troponin elevation. Stress testing was undertaken showing a moderate sized intensity and partially reversible anterior perfusion defect. Echo showed an EF of 50%. Decision was made to pursue diagnostic catheterization, which revealed severe native coronary artery disease with chronically occluded vein graft to the  RCA and a patent vein graft to the distal portion of the mid LAD. There were no targets for intervention and continued medical therapy was recommended. Following additional diuresis and stabilization of renal function, she was discharged home. Since her discharge, she is been weighing herself every day and weight has been 164-166 pounds on her scale. She has had no lower extremity edema. Overall her dyspnea has improved and she has not required oxygen throughout the day. She is able to walk around [facility] for several hours she says, without significant dyspnea but will express dyspnea exertion with higher levels of activity like when she has to carry her laundry back. She denies PND, orthopnea, dizziness, syncope, edema, chest pain, or early satiety.  Home Medications  Prior to Admission medications   Medication Sig Start Date End Date Taking? Authorizing Provider  aspirin EC 81 MG tablet Take 81 mg by mouth daily.   Yes Historical Provider, MD  carvedilol (COREG) 25 MG tablet Take 25 mg by mouth 2 (two) times daily with a meal. 11/28/11  Yes Danley Danker, MD  clopidogrel (PLAVIX) 75 MG tablet Take 75 mg by mouth daily. 11/28/11  Yes Danley Danker, MD  DULoxetine (CYMBALTA) 60 MG capsule Take 60 mg by mouth daily. 08/06/14  Yes Historical Provider, MD  ferrous sulfate 325 (65 FE) MG tablet Take 1 tablet (325 mg total) by mouth 2 (two) times daily with a meal. 10/29/14  Yes Starleen Arms, MD  furosemide (LASIX) 40 MG tablet Take 1 tablet (40 mg total) by mouth 2 (two) times daily. 08/11/13  Yes Blane Ohara, MD  gabapentin (NEURONTIN) 300 MG capsule Take 1 capsule (300 mg total) by mouth 2 (two) times daily. 07/09/14  Yes Kirstie Peri Regal, DPM  glucose ([facility] GLUCOSE) 4 GM chewable tablet Chew 1 tablet by mouth as needed for low blood sugar.   Yes Historical Provider, MD  insulin aspart (NOVOLOG FLEXPEN) 100 UNIT/ML SOPN FlexPen Inject 1-12 Units into the skin 3 (three) times daily with meals.  As needed per sliding scale   Yes Historical Provider, MD  Insulin Glargine (TOUJEO SOLOSTAR) 300 UNIT/ML SOPN Inject 16 Units into the skin at bedtime. 10/29/14  Yes Leana Roe Elgergawy, MD  ipratropium-albuterol (DUONEB) 0.5-2.5 (3) MG/3ML SOLN Take 3 mLs by nebulization 2 (two) times daily as needed (for shortnes of breath).    Yes Historical Provider, MD  irbesartan (AVAPRO) 300 MG tablet Take 1 tablet (300 mg total) by mouth at bedtime. 01/05/13  Yes Russella Dar, NP  isosorbide mononitrate (IMDUR) 30 MG 24 hr tablet Take 1 tablet (30 mg total) by mouth daily. 02/04/15  Yes Ok Anis, NP  lansoprazole (PREVACID) 15 MG capsule Take 15 mg by mouth daily at 12 noon.   Yes Historical Provider, MD  levothyroxine (SYNTHROID, LEVOTHROID) 25 MCG tablet Take 25 mcg  by mouth daily before breakfast.   Yes Historical Provider, MD  Multiple Vitamins-Minerals (WOMENS 50+ MULTI VITAMIN/MIN) TABS Take 1 tablet by mouth daily.   Yes Historical Provider, MD  nitroGLYCERIN (NITROSTAT) 0.4 MG SL tablet Place 0.4 mg under the tongue every 5 (five) minutes as needed. For chest pain   Yes Historical Provider, MD  oxyCODONE-acetaminophen (PERCOCET/ROXICET) 5-325 MG per tablet Take 1 tablet by mouth every 6 (six) hours as needed for moderate pain.  12/29/14  Yes Historical Provider, MD  OXYGEN-HELIUM IN Inhale 4 L into the lungs at bedtime.    Yes Historical Provider, MD  simvastatin (ZOCOR) 40 MG tablet Take 40 mg by mouth every evening.  11/28/11  Yes Danley Danker, MD    Review of Systems  As above, overall doing well. She does have some baseline dyspnea exertion, especially at higher levels of activity but even this has improved. She denies chest pain, PND, orthopnea, dizziness, syncope, edema, or early satiety..  All other systems reviewed and are otherwise negative except as noted above.  Physical Exam  VS:  BP 140/60 mmHg  Pulse 69  Ht  (1.575 m)  Wt 171 lb 3.2 oz (77.656 kg)  BMI 31.31  kg/m2 , BMI Body mass index is 31.31 kg/(m^2). GEN: Well nourished, well developed, in no acute distress. HEENT: normal. Neck: Supple, no JVD, carotid bruits, or masses. Cardiac: RRR, no rubs, or gallops. 2/6 systolic ejection murmur loudest at the right upper sternal border No clubbing, cyanosis, edema.  Radials/DP/PT 2+ and equal bilaterally.  Respiratory:  Respirations regular and unlabored, diminished breath sounds in bilateral bases. Otherwise clear to auscultation. GI: Soft, nontender, nondistended, BS + x 4. MS: no deformity or atrophy. Skin: warm and dry, no rash. Neuro:  Strength and sensation are intact. Psych: Normal affect.  Accessory Clinical Findings  ECG - regular sinus rhythm, first-degree AV block delayed R-wave progression, LVH with lateral T-wave inversion. No acute ST or T changes.  Assessment & Plan  1.  Chronic combined systolic and diastolic congestive heart failure: Patient was recently admitted with dyspnea and volume overload. She was diuresed successfully. Her weight has been stable at home in the 164-165 range and she has not been experiencing any edema, PND, or orthopnea. She does have chronic stable dyspnea on exertion but has had less need to use supplemental oxygen since discharge. She is closely watching her sodium and fluid intake at home. EF was 50% by echo during her most recent admission. She remains on beta blocker and ARB therapy.  2. Coronary artery disease: Status post recent abnormal stress test followed by catheterization revealing severe native multivessel disease with a patent graft to the distal LAD. She has been medically managed with aspirin, beta blocker, Plavix, nitrate, and statin therapy. She has not had any chest pain since discharge.  3. Hypertension: Blood pressures elevated today at 140/60. I reviewed her blood pressure log from home and she is typically running in the 120s to 130s at home. I will make no changes to her regimen  today.  4. Hyperlipidemia: LDL was 74 with normal LFTs in March. Continue statin therapy.  5. COPD: She uses supplemental O2 for higher levels of activity and to sleep. She is not wheezing.  Her primary care provider, Dr. Cliffton Asters, asked if it would be possible to switch her to an alternate, beta 1 selective beta blocker. I reviewed this with Ms. Kleiman. She denies significant dyspnea or wheezing at home. She has been  on carvedilol for some time now. Given history of heart failure and LV dysfunction, carvedilol would be our first choice in beta-blockade. Ms. Spitler prefers to remain on carvedilol since she tolerates it. I advised that if she were to develop worsening of respiratory status/wheezing, we could certainly switch her to Toprol-XL. She would likely require 200 mg daily and may also require an additional antihypertensive if we were to make the switch.  6. CKD III:  She reports having had labs drawn yesterday @ PCP's office.  She is on ARB.  7.  DMII:  Per IM.  8.  Disposition: Follow-up with Dr. Anne Fu in 1 month or sooner if necessary.   Nicolasa Ducking, NP 02/04/2015, 5:10 PM

## 2015-02-05 ENCOUNTER — Encounter: Payer: Self-pay | Admitting: Cardiovascular Disease

## 2015-02-07 ENCOUNTER — Other Ambulatory Visit: Payer: Self-pay | Admitting: Podiatry

## 2015-02-12 ENCOUNTER — Other Ambulatory Visit: Payer: Self-pay | Admitting: Podiatry

## 2015-02-13 ENCOUNTER — Other Ambulatory Visit: Payer: Self-pay | Admitting: *Deleted

## 2015-02-13 NOTE — Patient Outreach (Signed)
Transition of care call placed to member.  Member states that she has been doing well.  She states that her blood pressure has been under control, this morning being 125/60.  She states that she has not gained any weight, and has been losing a couple pounds over the past couple weeks.  She reports that her blood sugar is much better now since she has been intermittently increasing her Toujeo.  She states that she is now up to 38 units of Toujeo and that her blood sugar this morning was 102, which is within her target range.  Member instructed to continue this current dose of Toujeo and to only increase if her blood sugars were outside of her range again.  She states that her lowest blood sugar was 75.  Informed that if her blood sugar was below 70 to place a call to Dr. Sharl MaKerr.  Member verbalizes understanding.  Member denies any further concerns at this time.  Encourage to contact this care manager with any questions.  Kemper DurieMonica Jakai Risse, BSN, [facility] LPHN Care Management  [facility] Manager [phone]

## 2015-02-16 ENCOUNTER — Encounter: Payer: Self-pay | Admitting: Podiatry

## 2015-02-16 ENCOUNTER — Ambulatory Visit (INDEPENDENT_AMBULATORY_CARE_PROVIDER_SITE_OTHER): Payer: Medicare Other | Admitting: Podiatry

## 2015-02-16 DIAGNOSIS — B351 Tinea unguium: Secondary | ICD-10-CM | POA: Diagnosis not present

## 2015-02-16 DIAGNOSIS — M79676 Pain in unspecified toe(s): Secondary | ICD-10-CM

## 2015-02-16 NOTE — Patient Instructions (Signed)
Apply topical antibiotic ointment to the fifth left toe daily and cover with a Band-Aid until healed If you note was any sudden increase of pain, swelling, redness, strange in the fifth left toe present for further evaluation or present to ER  Diabetes and Foot Care Diabetes may cause you to have problems because of poor blood supply (circulation) to your feet and legs. This may cause the skin on your feet to become thinner, break easier, and heal more slowly. Your skin may become dry, and the skin may peel and crack. You may also have nerve damage in your legs and feet causing decreased feeling in them. You may not notice minor injuries to your feet that could lead to infections or more serious problems. Taking care of your feet is one of the most important things you can do for yourself.  HOME CARE INSTRUCTIONS  Wear shoes at all times, even in the house. Do not go barefoot. Bare feet are easily injured.  Check your feet daily for blisters, cuts, and redness. If you cannot see the bottom of your feet, use a mirror or ask someone for help.  Wash your feet with warm water (do not use hot water) and mild soap. Then pat your feet and the areas between your toes until they are completely dry. Do not soak your feet as this can dry your skin.  Apply a moisturizing lotion or petroleum jelly (that does not contain alcohol and is unscented) to the skin on your feet and to dry, brittle toenails. Do not apply lotion between your toes.  Trim your toenails straight across. Do not dig under them or around the cuticle. File the edges of your nails with an emery board or nail file.  Do not cut corns or calluses or try to remove them with medicine.  Wear clean socks or stockings every day. Make sure they are not too tight. Do not wear knee-high stockings since they may decrease blood flow to your legs.  Wear shoes that fit properly and have enough cushioning. To break in new shoes, wear them for just a few  hours a day. This prevents you from injuring your feet. Always look in your shoes before you put them on to be sure there are no objects inside.  Do not cross your legs. This may decrease the blood flow to your feet.  If you find a minor scrape, cut, or break in the skin on your feet, keep it and the skin around it clean and dry. These areas may be cleansed with mild soap and water. Do not cleanse the area with peroxide, alcohol, or iodine.  When you remove an adhesive bandage, be sure not to damage the skin around it.  If you have a wound, look at it several times a day to make sure it is healing.  Do not use heating pads or hot water bottles. They may burn your skin. If you have lost feeling in your feet or legs, you may not know it is happening until it is too late.  Make sure your health care provider performs a complete foot exam at least annually or more often if you have foot problems. Report any cuts, sores, or bruises to your health care provider immediately. SEEK MEDICAL CARE IF:   You have an injury that is not healing.  You have cuts or breaks in the skin.  You have an ingrown nail.  You notice redness on your legs or feet.  You feel  burning or tingling in your legs or feet.  You have pain or cramps in your legs and feet.  Your legs or feet are numb.  Your feet always feel cold. SEEK IMMEDIATE MEDICAL CARE IF:   There is increasing redness, swelling, or pain in or around a wound.  There is a red line that goes up your leg.  Pus is coming from a wound.  You develop a fever or as directed by your health care provider.  You notice a bad smell coming from an ulcer or wound. Document Released: 10/07/2000 Document Revised: 06/12/2013 Document Reviewed: 03/19/2013 [facility] Patient Information 2015 [place], [place]. This information is not intended to replace advice given to you by your health care provider. Make sure you discuss any questions you have with your health  care provider.

## 2015-02-17 NOTE — Progress Notes (Signed)
Patient ID: [name], female   DOB: 01/30/1946, 69 y.o.   MRN: [id number]  Subjective: This patient presents today requesting debridement of painful toenails and complains of some sensitivity in the fifth left toe  Objective: Absent toenails hallux bilaterally The toenails 2-5 are elongated, brittle, incurvated, discolored and tender direct palpation Keratoses overlying an area of inflammatory tissue 3 mm in diameter on the dorsal PIPJ the fifth left toe.   Assessment: Symptomatic onychomycoses 8 bilaterally Keratoses over inflammatory base fifth left toe  Plan: Debridement toenails 8 without any bleeding Debrided keratoses fifth left toe without any bleeding. there is no drainage,, warmth, edema in the fifth left toe Patient advised to apply topical antibiotic ointment and Band-Aid daily or fifth left toe until healed Advised patient if she noticed any sudden increase in pain, swelling, warmth in the fifth left toe to present for further evaluation or to ER

## 2015-02-20 ENCOUNTER — Other Ambulatory Visit: Payer: Self-pay | Admitting: *Deleted

## 2015-02-20 NOTE — Patient Outreach (Signed)
Call placed to member to complete transition of care program.  Member states that she has been able to maintain her weight, denies any weight gain over the past several weeks.  She denies any swelling or shortness of breath associated with CHF.  Member states that she has been taking her blood pressure on a daily basis, states that her blood pressure today was 128/80.  Member reports that she was told by her cardiologist to continue taking her Isosorbide as she was taking it (a full 30 mg instead of 15 mg).  She states that once she did continue to take the full 30 mg, she would feel dizzy after taking it.  She states that she independently cut the dose in half again, and has been feeling fine since she has been taking the 15 mg.  She states that she did contact her physician and let them know that she was taking the decrease dose.    Member reports that she has been having some diarrhea over the past couple weeks, and have seen a specialist.  She states that she has to obtain a stool specimen within the next week.  Member encouraged to do so, and to also continue to weigh herself to monitor any possible weight loss.  Encouraged to notify the physician if she notices any weight loss.    Member denies any questions or concerns at this time.  Routine home visit scheduled for next month.  Kemper DurieMonica Hubbard Seldon, BSN, [facility] Care Management  [facility] Care Manager [phone]

## 2015-03-09 ENCOUNTER — Other Ambulatory Visit: Payer: Self-pay | Admitting: *Deleted

## 2015-03-09 NOTE — Patient Outreach (Signed)
[facility]) Care Management   03/09/2015  [name] 1946-04-17 [id number]  [name] is an 69 y.o. female  Subjective:   Member states that she is "doing wonderful."  She reports that she had a birthday last week and has been feeling well, aside from the pain.  She states that she is starting to see a new orthopedic specialist this week for better pain control.  Objective:   Review of Systems  Constitutional: Negative.   HENT: Negative.   Eyes: Negative.   Respiratory: Negative.   Cardiovascular: Negative.   Gastrointestinal: Positive for diarrhea.       Has appointment to follow up with Dr. Oletta Lamas, C-diff negative  Genitourinary: Negative.   Musculoskeletal: Positive for back pain and joint pain.       Has appointment to see Dr. Alfonso Ramus, orthopedic specialist  Skin: Negative.   Neurological: Negative.   Endo/Heme/Allergies: Negative.   Psychiatric/Behavioral: Negative.     Physical Exam  Constitutional: She is oriented to person, place, and time. She appears well-developed and well-nourished.  Neck: Normal range of motion.  Cardiovascular: Normal rate, regular rhythm and normal heart sounds.   Respiratory: Effort normal and breath sounds normal.  GI: Soft. Bowel sounds are normal.  Musculoskeletal: Normal range of motion.  Neurological: She is alert and oriented to person, place, and time.  Skin: Skin is warm and dry.  Psychiatric: She has a normal mood and affect. Her behavior is normal. Judgment and thought content normal.   BP 110/78 mmHg  Pulse 60  Resp 16  Ht 1.575 m ('5\' 2"' )  Wt 167 lb (75.751 kg)  BMI 30.54 kg/m2  SpO2 96%  Current Medications:   Current Outpatient Prescriptions  Medication Sig Dispense Refill  . aspirin EC 81 MG tablet Take 81 mg by mouth daily.    . carvedilol (COREG) 25 MG tablet Take 25 mg by mouth 2 (two) times daily with a meal.    . clopidogrel (PLAVIX) 75 MG tablet Take 75 mg by mouth daily.    .  DULoxetine (CYMBALTA) 60 MG capsule Take 60 mg by mouth daily.  5  . ferrous sulfate 325 (65 FE) MG tablet Take 1 tablet (325 mg total) by mouth 2 (two) times daily with a meal. 60 tablet 0  . furosemide (LASIX) 40 MG tablet Take 1 tablet (40 mg total) by mouth 2 (two) times daily. 20 tablet 0  . gabapentin (NEURONTIN) 300 MG capsule TAKE 1 CAPSULE (300 MG TOTAL) BY MOUTH 2 (TWO) TIMES DAILY. 90 capsule 3  . glucose ([employer] GLUCOSE) 4 GM chewable tablet Chew 1 tablet by mouth as needed for low blood sugar.    . insulin aspart (NOVOLOG FLEXPEN) 100 UNIT/ML SOPN FlexPen Inject 1-12 Units into the skin 3 (three) times daily with meals. As needed per sliding scale    . Insulin Glargine (TOUJEO SOLOSTAR) 300 UNIT/ML SOPN Inject 16 Units into the skin at bedtime.    Marland Kitchen ipratropium-albuterol (DUONEB) 0.5-2.5 (3) MG/3ML SOLN Take 3 mLs by nebulization 2 (two) times daily as needed (for shortnes of breath).     . irbesartan (AVAPRO) 300 MG tablet Take 1 tablet (300 mg total) by mouth at bedtime.  0  . isosorbide mononitrate (IMDUR) 30 MG 24 hr tablet Take 1 tablet (30 mg total) by mouth daily. 30 tablet 5  . lansoprazole (PREVACID) 15 MG capsule Take 15 mg by mouth daily at 12 noon.    Marland Kitchen levothyroxine (SYNTHROID, LEVOTHROID)  50 MCG tablet   6  . Multiple Vitamins-Minerals (WOMENS 50+ MULTI VITAMIN/MIN) TABS Take 1 tablet by mouth daily.    . nitroGLYCERIN (NITROSTAT) 0.4 MG SL tablet Place 0.4 mg under the tongue every 5 (five) minutes as needed. For chest pain    . OXYGEN-HELIUM IN Inhale 4 L into the lungs at bedtime.     . simvastatin (ZOCOR) 40 MG tablet Take 40 mg by mouth every evening.     . gabapentin (NEURONTIN) 300 MG capsule TAKE 1 CAPSULE (300 MG TOTAL) BY MOUTH 2 (TWO) TIMES DAILY. (Patient not taking: Reported on 03/09/2015) 90 capsule 3  . oxyCODONE-acetaminophen (PERCOCET/ROXICET) 5-325 MG per tablet Take 1 tablet by mouth every 6 (six) hours as needed for moderate pain.   0  . [DISCONTINUED]  dexlansoprazole (DEXILANT) 60 MG capsule Take 60 mg by mouth daily.     No current facility-administered medications for this visit.    Functional Status:   In your present state of health, do you have any difficulty performing the following activities: 01/28/2015 01/10/2015  Hearing? - N  Vision? - N  Difficulty concentrating or making decisions? - N  Walking or climbing stairs? - N  Dressing or bathing? - N  Doing errands, shopping? - N  Conservation officer, nature and eating ? N -  Using the Toilet? N -  In the past six months, have you accidently leaked urine? Y -  Do you have problems with loss of bowel control? N -  Managing your Medications? Y -  Managing your Finances? N -  Housekeeping or managing your Housekeeping? Y -    Fall/Depression Screening:    PHQ 2/9 Scores 01/28/2015  PHQ - 2 Score 0    Assessment:    Met with member at scheduled time.  Member states that although she has been feeling well, her blood sugars has been up due to being out of Toujeo for about a week.  She states that she was able to obtain the medication on Saturday to restart.  Member advised if there is a problem with obtaining medications in the future to notify this care manager to search for resources.  Member instructed to monitor blood sugar for the next several days to see if the blood sugars come down with the restart of the Toujeo.  She reports taking 28 units daily at this time, instructed to continue to increase as previously instructed if her morning blood sugars are not within range (less than 130) after several days of restarting.  Member verbalizes understanding.  Member states that she continues to have back pain, rating it at a 7/10 today.  She reports that she has not taken any pain medications today.  She sates that she would be interested in more exercise if the pain was under control.  She states that she will discuss this with Dr. Alfonso Ramus on this week.  She also states that she will discuss with Dr.  [name] the possibility of light walking on the treadmill at her appointment with him on next week.  She states that she was able to go to the mall this past weekend and walk around a couple stores before getting tired.  She reports that although she was tired, she felt much better with the exercise.    Member states that she continues to have diarrhea, but her test for C-diff was negative.  She does have a follow up appointment with Dr. Oletta Lamas next month.  Member denies further questions or  concerns at this time.  Encouraged to contact this care manager with any questions.    Plan:   Routine home visit scheduled for next month.  [facility] CM Care Plan Problem One        Patient Outreach from 03/09/2015 in [place] for Problem One  Not Active    [facility] CM Care Plan Problem Two        Patient Outreach from 03/09/2015 in [place] Problem Two  Medication Adherence   Care Plan for Problem Two  Active   Interventions for Problem Two Long Term Goal   Instructions provided for Insulin titrations according to physician   [facility] Long Term Goal (31-90) days  Member will report taking insulin and adjusting dose as prescribed over the next 45 days, keeping blood sugar within given range   [facility] Long Term Goal Start Date  02/20/15   [facility] CM Short Term Goal #1 (0-30 days)  Member's morning blood sugar will remain within range for the next 4 weeks   [facility] CM Short Term Goal #1 Start Date  03/09/15   Interventions for Short Term Goal #2   Discussed with member thie importance of keeping correct insulin on hand, and not running out    [place] Problem Three        Patient Outreach from 03/09/2015 in [place] Problem Three  complaints of diarrhea over the past 2 weeks   Care Plan for Problem Three  Active   [facility] CM Short Term Goal #1 (0-30 days)  Member will provide gastrointestinal physician with a stool sample within the next week    [facility] CM Short Term Goal #1 Start Date  02/20/15   [facility] CM Short Term Goal #1 Met Date  02/27/15   Interventions for Short Term Goal #1  Discussed the importance of a stool sample in order to assist with diagnosis and treatment   [facility] CM Short Term Goal #2 (0-30 days)  Member will continue to weigh self on a daily basis, monitoring for possible weight loss due to diarrhea for the next 4 weeks   [facility] CM Short Term Goal #2 Start Date  02/20/15   Interventions for Short Term Goal #2  Discussed the importance of drinking and eating normally to decrease chances of weight loss and dehydration      Valente David, BSN, [place] Manager [phone]

## 2015-03-12 ENCOUNTER — Other Ambulatory Visit: Payer: Self-pay | Admitting: General Practice

## 2015-03-12 ENCOUNTER — Other Ambulatory Visit: Payer: Self-pay

## 2015-03-12 ENCOUNTER — Other Ambulatory Visit: Payer: Self-pay | Admitting: Sports Medicine

## 2015-03-12 DIAGNOSIS — M5440 Lumbago with sciatica, unspecified side: Secondary | ICD-10-CM

## 2015-03-19 ENCOUNTER — Encounter: Payer: Self-pay | Admitting: Cardiology

## 2015-03-19 ENCOUNTER — Ambulatory Visit (INDEPENDENT_AMBULATORY_CARE_PROVIDER_SITE_OTHER): Payer: Medicare Other | Admitting: Cardiology

## 2015-03-19 VITALS — BP 110/66 | HR 47 | Ht 62.0 in | Wt 170.8 lb

## 2015-03-19 DIAGNOSIS — I2583 Coronary atherosclerosis due to lipid rich plaque: Principal | ICD-10-CM

## 2015-03-19 DIAGNOSIS — I2510 Atherosclerotic heart disease of native coronary artery without angina pectoris: Secondary | ICD-10-CM

## 2015-03-19 DIAGNOSIS — I5022 Chronic systolic (congestive) heart failure: Secondary | ICD-10-CM | POA: Diagnosis not present

## 2015-03-19 DIAGNOSIS — I259 Chronic ischemic heart disease, unspecified: Secondary | ICD-10-CM

## 2015-03-19 NOTE — Progress Notes (Signed)
[address] [place], [place]  [address] Phone: [phone] Fax:  [phone]  Date:  03/19/2015   ID:  [name], DOB 05/07/1946, MRN [id number]  PCP:  Cala Bradford, MD   History of Present Illness: [name] is a 69 y.o. female  discharged on 01/04/13 with COPD, chronic systolic congestive heart failure with EF of 20-25%, most recent 50%. She has tracheostomy, placed by Dr. Jenne Pane. She has been on chronic oxygen therapy secondary to COPD as well as systolic heart failure.  Overall, she is feeling fairly well, no shortness of breath. She has gained some weight recently. She does feel some increased lower extremity edema.   Left LE bypass, Dr. Darrick Penna. Plavix.   Overall she is quite happy.  She did take an extra Lasix at one point when she was feeling short of breath.    Wt Readings from Last 3 Encounters:  03/19/15 170 lb 12.8 oz (77.474 kg)  03/09/15 167 lb (75.751 kg)  02/04/15 171 lb 3.2 oz (77.656 kg)     Past Medical History  Diagnosis Date  . Diabetes mellitus     on Lantus 30 U   . CAD (coronary artery disease)     a. S/p CABG and stenting b. L&R cath 01/09/15 LM patent, LAD 100% occluded, LCX moderate disease, RCA 100% occluded. SVG-RCA CTO, SVG-mid LAD patent with more distal LAD 70% stenosis. Recs for medical management.   Marland Kitchen PAD (peripheral artery disease)   . Stroke     MRI 11/2011 with remote occipital lobe. MRA with moderate left focal vertebral artery stenosis  . Chronic combined systolic and diastolic CHF (congestive heart failure)     a. 11/2011 Echo with EF 30-35%, global hypokinesis, and inferior akinesis;  b. 12/2014 Echo: EF 50%, mod LVH, Ao sclerosis w/o stenosis, mildly dil LA, mild to mod RV dysfxn.  . Hyperlipidemia   . DDD (degenerative disc disease), lumbar   . MI (myocardial infarction) 1997  . COPD (chronic obstructive pulmonary disease)     a. prn and HS supplemental O2.  . Anemia   . Hypertension   . GERD  (gastroesophageal reflux disease)   . History of IBS   . Hypothyroidism     Goiter  . Carotid artery occlusion   . CKD (chronic kidney disease), stage III     stage 3  . History of tracheostomy 08/26/2013    Dr. Jenne Pane    Past Surgical History  Procedure Laterality Date  . Ptca    . Thyroidectomy    . Coronary artery bypass graft      2 vessel  . Carotid endarterectomy  ~2008    Left   . Cholecystectomy    . Tracheostomy tube placement  01/02/2012  . Angioplasty  [id number]    Aortogram by Dr. Italy McKenzie [facility])  . Pr vein bypass graft,aorto-fem-pop      Right common femoral-AK popliteal BPG & Right Popliteal-posterior tibial  . Pr vein bypass graft,aorto-fem-pop      Left Fem-pop BPG  . Carpal tunnel release Right   . Femoral-tibial bypass graft Left 06/11/2013    Procedure: BYPASS GRAFT  LEFT FEMORAL- POSTERIOR TIBIAL ARTERY/ REDO;  Surgeon: Sherren Kerns, MD;  Location: [facility] OR;  Service: Vascular;  Laterality: Left;  . Thrombectomy femoral artery Left 06/11/2013    Procedure: THROMBECTOMY FEMORAL ARTERY;  Surgeon: Sherren Kerns, MD;  Location: [facility] OR;  Service: Vascular;  Laterality: Left;  . Spine surgery  Oct. 27, 2014    Injection - Back  . Abdominal aortagram N/A 04/05/2013    Procedure: ABDOMINAL Ronny FlurryAORTAGRAM;  Surgeon: Sherren Kernsharles E Fields, MD;  [facility];  Service: Cardiovascular;  Laterality: N/A;  . Colonoscopy N/A 10/27/2014    Procedure: COLONOSCOPY;  Surgeon: Willis ModenaWilliam Outlaw, MD;  [facility];  Service: Endoscopy;  Laterality: N/A;  . Left and right heart catheterization with coronary angiogram N/A 01/09/2015    Procedure: LEFT AND RIGHT HEART CATHETERIZATION WITH CORONARY ANGIOGRAM;  Surgeon: Marykay Lexavid W Harding, MD;  [facility];  Service: Cardiovascular;  Laterality: N/A;    Current Outpatient Prescriptions  Medication Sig Dispense Refill  . aspirin EC 81 MG tablet Take 81 mg by mouth daily.    . carvedilol (COREG) 25 MG tablet  Take 25 mg by mouth 2 (two) times daily with a meal.    . clopidogrel (PLAVIX) 75 MG tablet Take 75 mg by mouth daily.    . DULoxetine (CYMBALTA) 60 MG capsule Take 60 mg by mouth daily.  5  . ferrous sulfate 325 (65 FE) MG tablet Take 1 tablet (325 mg total) by mouth 2 (two) times daily with a meal. 60 tablet 0  . furosemide (LASIX) 40 MG tablet Take 1 tablet (40 mg total) by mouth 2 (two) times daily. 20 tablet 0  . gabapentin (NEURONTIN) 300 MG capsule TAKE 1 CAPSULE (300 MG TOTAL) BY MOUTH 2 (TWO) TIMES DAILY. 90 capsule 3  . gabapentin (NEURONTIN) 300 MG capsule TAKE 1 CAPSULE (300 MG TOTAL) BY MOUTH 2 (TWO) TIMES DAILY. 90 capsule 3  . glucose ([employer] GLUCOSE) 4 GM chewable tablet Chew 1 tablet by mouth as needed for low blood sugar.    . insulin aspart (NOVOLOG FLEXPEN) 100 UNIT/ML SOPN FlexPen Inject 1-12 Units into the skin 3 (three) times daily with meals. As needed per sliding scale    . Insulin Glargine (TOUJEO SOLOSTAR) 300 UNIT/ML SOPN Inject 16 Units into the skin at bedtime.    Marland Kitchen. ipratropium-albuterol (DUONEB) 0.5-2.5 (3) MG/3ML SOLN Take 3 mLs by nebulization 2 (two) times daily as needed (for shortnes of breath).     . irbesartan (AVAPRO) 300 MG tablet Take 1 tablet (300 mg total) by mouth at bedtime.  0  . isosorbide mononitrate (IMDUR) 30 MG 24 hr tablet Take 1 tablet (30 mg total) by mouth daily. 30 tablet 5  . lansoprazole (PREVACID) 15 MG capsule Take 15 mg by mouth daily at 12 noon.    Marland Kitchen. levothyroxine (SYNTHROID, LEVOTHROID) 50 MCG tablet   6  . Multiple Vitamins-Minerals (WOMENS 50+ MULTI VITAMIN/MIN) TABS Take 1 tablet by mouth daily.    . nitroGLYCERIN (NITROSTAT) 0.4 MG SL tablet Place 0.4 mg under the tongue every 5 (five) minutes as needed. For chest pain    . OXYGEN-HELIUM IN Inhale 4 L into the lungs at bedtime.     . simvastatin (ZOCOR) 40 MG tablet Take 40 mg by mouth every evening.     . [DISCONTINUED] dexlansoprazole (DEXILANT) 60 MG capsule Take 60 mg by mouth  daily.     No current facility-administered medications for this visit.    Allergies:    Allergies  Allergen Reactions  . Aldactone [Spironolactone] Other (See Comments)    Severe hyperkalemia   . Lisinopril Other (See Comments) and Cough    Hypotension also  . Crestor [Rosuvastatin Calcium] Other (See Comments)    Muscle Pain  .  Vicodin [Hydrocodone-Acetaminophen] Nausea And Vomiting    Social History:  The patient  reports that she quit smoking about 4 years ago. Her smoking use included Cigarettes. She has a 50 pack-year smoking history. She has never used smokeless tobacco. She reports that she does not drink alcohol or use illicit drugs.   ROS:  Please see the history of present illness.   Denies any lower extremity pain, recent left lower extremity bypass, no fevers, no chills, no cough, no syncope, no significant orthopnea. Occasional shortness of breath.    PHYSICAL EXAM: VS:  BP 110/66 mmHg  Pulse 47  Ht  (1.575 m)  Wt 170 lb 12.8 oz (77.474 kg)  BMI 31.23 kg/m2  SpO2 98% Well nourished, well developed, in no acute distress HEENT: Tracheostomy noted midline. Neck: no JVD Cardiac:  normal S1, S2; RRR; no murmur Lungs:  clear to auscultation bilaterally, no wheezing, rhonchi or rales Abd: soft, nontender, no hepatomegaly Ext: 1+ BL edema, left dimishished pulsed,  Skin: warm and dry Neuro: no focal abnormalities noted  Labs: 09/11/13-Sodium 139, potassium 4.4, creatinine 1.2 ECG: 07/17/14-sinus rhythm, first degree AV block, 240 ms PR interval, poor R wave progression, nonspecific ST-T wave changes, QTC 498  ASSESSMENT AND PLAN:  1. Chronic systolic heart failure-EF 25%. Her current EF is 50%. She has been tolerating Lasix twice a day well. She may take in the morning and early afternoon. It does not have to be every 12 hours. Overall she is doing well. 2. CAD-status post bypass surgery. Overall doing well. No anginal symptoms. 3. Old myocardial  infarction-1997. 4. Status post tracheostomy-Dr. Jenne Pane. Subglottis stenosis with chronic tracheostomy 5. Chronic anemia-mild. 6. Home oxygen-per pulmonary. 7. Hypertension-currently well controlled. 8. Peripheral vascular disease-as described above. Recent left lower extremity bypass. Dr Darrick Penna 9. COPD-could also be playing a role in her shortness of breath. On home O2.  10. 4 month follow up, with Tereso Newcomer  Signed, Donato Schultz, MD [facility]  03/19/2015 2:40 PM

## 2015-03-19 NOTE — Patient Instructions (Signed)
Medication Instructions:  Your physician recommends that you continue on your current medications as directed. Please refer to the Current Medication list given to you today.  Follow-Up: Follow up in 4 months with Scott Weaver, PA.  Thank you for choosing [facility]!!      

## 2015-03-20 ENCOUNTER — Other Ambulatory Visit: Payer: Self-pay

## 2015-03-20 DIAGNOSIS — Z1231 Encounter for screening mammogram for malignant neoplasm of breast: Secondary | ICD-10-CM

## 2015-03-26 ENCOUNTER — Encounter: Payer: Self-pay | Admitting: *Deleted

## 2015-03-28 ENCOUNTER — Ambulatory Visit
Admission: RE | Admit: 2015-03-28 | Discharge: 2015-03-28 | Disposition: A | Discharge status: Home / Self Care | Payer: Medicare Other | Source: Ambulatory Visit

## 2015-03-28 DIAGNOSIS — M5440 Lumbago with sciatica, unspecified side: Secondary | ICD-10-CM

## 2015-04-06 ENCOUNTER — Other Ambulatory Visit: Payer: Self-pay | Admitting: *Deleted

## 2015-04-06 NOTE — Patient Outreach (Signed)
[facility]) Care Management   04/06/2015  [name] 1946-08-12 [id number]  [name] is an 69 y.o. female  Subjective:   Member states that she "haven't been feeling too good lately.  I still can't get rid of this diarrhea and they can't figure out what's going on with me."  Objective:   Review of Systems  Constitutional: Negative.   HENT: Negative.   Eyes: Negative.   Respiratory: Negative.   Cardiovascular: Negative.   Gastrointestinal: Positive for diarrhea.  Genitourinary: Negative.   Musculoskeletal: Negative.   Skin: Negative.   Neurological: Negative.   Endo/Heme/Allergies: Negative.   Psychiatric/Behavioral: Negative.     Physical Exam  Constitutional: She is oriented to person, place, and time. She appears well-developed and well-nourished.  Neck: Normal range of motion.  Cardiovascular: Normal rate, regular rhythm and normal heart sounds.   Respiratory: Effort normal and breath sounds normal.  GI: Soft. Bowel sounds are normal.  Musculoskeletal: Normal range of motion.  Neurological: She is alert and oriented to person, place, and time.  Skin: Skin is warm and dry.   BP 112/68 mmHg  Pulse 70  Resp 18  Wt 166 lb (75.297 kg)  SpO2 94%  Current Medications:   Current Outpatient Prescriptions  Medication Sig Dispense Refill  . aspirin EC 81 MG tablet Take 81 mg by mouth daily.    . carvedilol (COREG) 25 MG tablet Take 25 mg by mouth 2 (two) times daily with a meal.    . clopidogrel (PLAVIX) 75 MG tablet Take 75 mg by mouth daily.    . DULoxetine (CYMBALTA) 60 MG capsule Take 60 mg by mouth daily.  5  . ferrous sulfate 325 (65 FE) MG tablet Take 1 tablet (325 mg total) by mouth 2 (two) times daily with a meal. 60 tablet 0  . furosemide (LASIX) 40 MG tablet Take 1 tablet (40 mg total) by mouth 2 (two) times daily. 20 tablet 0  . gabapentin (NEURONTIN) 300 MG capsule TAKE 1 CAPSULE (300 MG TOTAL) BY MOUTH 2 (TWO) TIMES DAILY. 90  capsule 3  . glucose ([facility] GLUCOSE) 4 GM chewable tablet Chew 1 tablet by mouth as needed for low blood sugar.    . insulin aspart (NOVOLOG FLEXPEN) 100 UNIT/ML SOPN FlexPen Inject 1-12 Units into the skin 3 (three) times daily with meals. As needed per sliding scale    . Insulin Glargine (TOUJEO SOLOSTAR) 300 UNIT/ML SOPN Inject 16 Units into the skin at bedtime.    Marland Kitchen ipratropium-albuterol (DUONEB) 0.5-2.5 (3) MG/3ML SOLN Take 3 mLs by nebulization 2 (two) times daily as needed (for shortnes of breath).     . irbesartan (AVAPRO) 300 MG tablet Take 1 tablet (300 mg total) by mouth at bedtime.  0  . isosorbide mononitrate (IMDUR) 30 MG 24 hr tablet Take 1 tablet (30 mg total) by mouth daily. 30 tablet 5  . lansoprazole (PREVACID) 15 MG capsule Take 15 mg by mouth daily at 12 noon.    Marland Kitchen levothyroxine (SYNTHROID, LEVOTHROID) 50 MCG tablet   6  . Multiple Vitamins-Minerals (WOMENS 50+ MULTI VITAMIN/MIN) TABS Take 1 tablet by mouth daily.    . nitroGLYCERIN (NITROSTAT) 0.4 MG SL tablet Place 0.4 mg under the tongue every 5 (five) minutes as needed. For chest pain    . OXYGEN-HELIUM IN Inhale 4 L into the lungs at bedtime.     . Pancrelipase, Lip-Prot-Amyl, (ZENPEP) 20000 UNITS CPEP Take 1 capsule by mouth every morning.    Marland Kitchen  simvastatin (ZOCOR) 40 MG tablet Take 40 mg by mouth every evening.     . gabapentin (NEURONTIN) 300 MG capsule TAKE 1 CAPSULE (300 MG TOTAL) BY MOUTH 2 (TWO) TIMES DAILY. (Patient not taking: Reported on 04/06/2015) 90 capsule 3  . [DISCONTINUED] dexlansoprazole (DEXILANT) 60 MG capsule Take 60 mg by mouth daily.     No current facility-administered medications for this visit.    Functional Status:   In your present state of health, do you have any difficulty performing the following activities: 03/09/2015 01/28/2015  Hearing? N -  Vision? Y -  Difficulty concentrating or making decisions? N -  Walking or climbing stairs? Y -  Dressing or bathing? N -  Doing errands, shopping?  Y -  Conservation officer, nature and eating ? - N  Using the Toilet? - N  In the past six months, have you accidently leaked urine? - Y  Do you have problems with loss of bowel control? - N  Managing your Medications? - Y  Managing your Finances? - N  Housekeeping or managing your Housekeeping? - Y    Fall/Depression Screening:    PHQ 2/9 Scores 03/09/2015 01/28/2015  PHQ - 2 Score 0 0    Assessment:    Met with member at scheduled time at her home.  Member reports that she has continued to see Dr. Oletta Lamas due to very frequent diarrhea.  She states that her tests so far has been negative, including C-diff, but the diarrhea has continued.  She denies any abdominal pain with the diarrhea, but states that she can barely get through a meal without running to the bathroom.  Despite the diarrhea, she has maintained a steady weight.  Member states that she has another stool sample for Dr. Oletta Lamas that she will provide him within the next few days.  She also has a follow up appointment at the end of the month.  She was provided a sample of Zenpep that she states she started on this past Saturday.  She is concerned about the side effects of the medication, but will try it for the next week to see if her diarrhea will decrease.  Member is also concerned about being able to go out without having accidents in public.  Member states that she will obtain some depends and will take some Imodium when she will be out.  Encouraged to use the medication only by the given instructions, and to inform Dr. Oletta Lamas of the Imodium.    Member reports that her pain level in her knees has been better since she has had steroid injections with her new ortho specialist.  She reports that she will receive injections in her back within the next few weeks.    Member reports that her blood sugar was increased for a few days after her injections.  She reports that she is still taking 28 units of Toujeo, however, her blood sugars has been out of  the provided range (70-130).  Encouraged member to increase her Toujeo by 4 units according to instructions previously provided by Dr. Buddy Duty.  Instructed to monitor blood sugar for the next 4 days, and if blood sugar continues to be above 130, increase by another 4 units.  Member verbalized understanding.    Member states that she will have an appointment with Dr. Buddy Duty next week.  She will at that time inquire about another A1C (last drawn in March).  Member denies any further questions. Encouraged to contact this care manager with any  concerns.    Plan:   Routine home visit scheduled for next month.  [facility] CM Care Plan Problem One        Patient Outreach from 03/09/2015 in [place] for Problem One  Not Active    [facility] CM Care Plan Problem Two        Patient Outreach from 04/06/2015 in [place] Problem Two  Medication Adherence   Care Plan for Problem Two  Active   Interventions for Problem Two Long Term Goal   Instructions provided for Insulin titrations according to physician   [facility] Long Term Goal (31-90) days  Member will report taking insulin and adjusting dose as prescribed over the next 45 days, keeping blood sugar within given range   [facility]N Long Term Goal Start Date  04/06/15 [Goal still not met, date resterted]   [facility]N CM Short Term Goal #1 (0-30 days)  Member will titrate insulin (Toujeo) so that fasting blood sugar remain within the given range over the next 4 weeks   [facility]N CM Short Term Goal #1 Start Date  04/06/15   Interventions for Short Term Goal #2   Discussed with member thie importance of keeping correct insulin on hand, and not running out    [place] Problem Three        Patient Outreach from 04/06/2015 in [place] Problem Three  Diarrhea   Care Plan for Problem Three  Active   [facility]N CM Short Term Goal #1 (0-30 days)  Member's diarrhea will be controlled within then next 4 weeks   [facility]N CM Short  Term Goal #1 Start Date  04/06/15   Interventions for Short Term Goal #1  Discussed the importance of taking new medications in effort to control symptoms of diarrhea   [facility]N CM Short Term Goal #2 (0-30 days)  Member will continue to weigh self on a daily basis, monitoring for possible weight loss due to diarrhea for the next 4 weeks   [facility]N CM Short Term Goal #2 Start Date  02/20/15   [facility] CM Short Term Goal #2 Met Date  04/06/15   Interventions for Short Term Goal #2  Discussed the importance of drinking and eating normally to decrease chances of weight loss and dehydration     Valente David, BSN, [place] Manager [phone]

## 2015-04-15 ENCOUNTER — Telehealth: Payer: Self-pay

## 2015-04-15 NOTE — Telephone Encounter (Deleted)
[name], MD  Lake Bells, RN           OK to hold Plavix  Donato Schultz, MD       Previous Messages                 04/15/15 11:55am  Diannia Ruder from Dr. Marcy Siren office called.  Request discontinuing Plavix for an epidural injection.

## 2015-04-15 NOTE — Telephone Encounter (Signed)
OK to hold plavix for injection.  Donato Schultz, MD

## 2015-04-15 NOTE — Telephone Encounter (Signed)
04/15/15 11:55am  Kara from Dr. Marcy Siren office called.  Request discontinuing Plavix for an epidural injection.  Sincerely,  Alfredia Client Teage, Triage    [name], MD  Lake Bells, RN           OK to hold Plavix  Donato Schultz, MD      How many days do you want to hold plavix prior to procedure and when would you like it resumed?

## 2015-04-16 NOTE — Telephone Encounter (Signed)
How long can she hold Plavix?

## 2015-04-17 ENCOUNTER — Ambulatory Visit
Admission: RE | Admit: 2015-04-17 | Discharge: 2015-04-17 | Disposition: A | Discharge status: Home / Self Care | Payer: Medicare Other | Source: Ambulatory Visit

## 2015-04-17 DIAGNOSIS — Z1231 Encounter for screening mammogram for malignant neoplasm of breast: Secondary | ICD-10-CM

## 2015-04-20 ENCOUNTER — Telehealth: Payer: Self-pay | Admitting: Cardiology

## 2015-04-20 NOTE — Telephone Encounter (Signed)
Hold plavix 7 days. Resume as soon as comfortable by performing MD. Donato SchultzSKAINS, Teela Narducci, MD

## 2015-04-20 NOTE — Telephone Encounter (Signed)
Clearance placed in nurse fax box from previous message with clearance information provided by Dr. Anne FuSkains. See phone note from 04/15/15.

## 2015-04-20 NOTE — Telephone Encounter (Signed)
New message     Request for surgical clearance:  1. What type of surgery is being performed? Epidural injection  2. When is this surgery scheduled? Can't schedule until receive approval for holding medication   3. Are there any medications that need to be held prior to surgery and how long? Plavix  7 days   4. Name of physician performing surgery? Dr. Maurice SmallIbazebo  5. What is your office phone and fax number? [facility] [phone]  Fax [phone]  Please advise.  Office has also faxed request

## 2015-04-20 NOTE — Telephone Encounter (Signed)
Hold plavix 7 days prior. Resume when comfortable with performing MD. Donato SchultzSKAINS, Sahily Biddle, MD

## 2015-04-20 NOTE — Telephone Encounter (Signed)
Fax placed in nurse fax box to be sent over to Dr. Marcy SirenIbazebo's office.

## 2015-05-07 ENCOUNTER — Other Ambulatory Visit: Payer: Self-pay | Admitting: *Deleted

## 2015-05-07 ENCOUNTER — Ambulatory Visit: Payer: Medicare Other | Admitting: *Deleted

## 2015-05-07 NOTE — Patient Instructions (Signed)
Clostridium Difficile FAQs °What is Clostridium difficile infection?  °Clostridium difficile [pronounced Klo-STRID-ee-um dif-uh-SEEL], also known as "C. diff" [See-dif], is a germ that can cause diarrhea. Most cases of C. diff infection occur in patients taking antibiotics. The most common symptoms of a C. diff infection include: °· Watery diarrhea °· Fever °· Loss of appetite °· Nausea °· Belly pain and tenderness °Who is most likely to get C. diff infection? °The elderly and people with certain medical problems have the greatest chance of getting C. diff. C. diff spores can live outside the human body for a very long time and may be found on things in the environment such as bed linens, bed rails, bathroom fixtures, and medical equipment. C. diff infection can spread from person-to-person on contaminated equipment and on the hands of doctors, nurses, other healthcare providers and visitors. °Can C. diff infection be treated? °Yes, there are antibiotics that can be used to treat C. diff. In some severe cases, a person might have to have surgery to remove the infected part of the intestines. This surgery is needed in only 1 or 2 out of every 100 persons with C. diff. °What are some of the things that hospitals are doing to prevent C. diff infections? °To prevent C. diff infections, doctors, nurses, and other healthcare providers: °· Clean their hands with soap and water or an alcohol-based hand rub before and after caring for every patient. This can prevent C. diff and other germs from being passed from one patient to another on their hands. °· Carefully clean hospital rooms and medical equipment that have been used for patients with C. diff. °· Use Contact Precautions to prevent C. diff from spreading to other patients. Contact Precautions mean: °¨ Whenever possible, patients with C. diff will have a single room or share a room only with someone else who also has C. diff. °¨ Healthcare providers will put on gloves  and wear a gown over their clothing while taking care of patients with C. diff. °¨ Visitors may also be asked to wear a gown and gloves. °¨ When leaving the room, hospital providers and visitors remove their gown and gloves and clean their hands. °¨ Patients on Contact Precautions are asked to stay in their hospital rooms as much as possible. They should not go to common areas, such as the gift shop or cafeteria. They can go to other areas of the hospital for treatments and tests. °· Only give patients antibiotics when it is necessary. °What can I do to help prevent C. diff infections? °· Make sure that all doctors, nurses, and other healthcare providers clean their hands with soap and water or an alcohol-based hand rub before and after caring for you. °· If you do not see your providers clean their hands, please ask them to do so. °· Only take antibiotics as prescribed by your doctor. °· Be sure to clean your own hands often, especially after using the bathroom and before eating. °Can my friends and family get C. diff when they visit me? °C. diff infection usually does not occur in persons who are not taking antibiotics. Visitors are not likely to get C. diff. Still, to make it safer for visitors, they should: °· Clean their hands before they enter your room and as they leave your room °· Ask the nurse if they need to wear protective gowns and gloves when they visit you. °What do I need to do when I go home from the hospital? °Once you   are back at home, you can return to your normal routine. Often, the diarrhea will be better or completely gone before you go home. This makes giving C. diff to other people much less likely. There are a few things you should do, however, to lower the chances of developing C. diff infection again or of spreading it to others. °· If you are given a prescription to treat C. diff, take the medicine exactly as prescribed by your doctor and pharmacist. Do not take half-doses or stop before  you run out. °· Wash your hands often, especially after going to the bathroom and before preparing food. °· People who live with you should wash their hands often as well. °· If you develop more diarrhea after you get home, tell your doctor immediately. °· Your doctor may give you additional instructions. °If you have questions, please ask your doctor or nurse. °Developed and co-sponsored by The Society for Healthcare Epidemiology of America (SHEA); Infectious Diseases Society of America (IDSA); American Hospital Association; Association for Professionals in Infection Control and Epidemiology (APIC); Centers for Disease Control and Prevention (CDC); and The Joint Commission. °Document Released: 10/15/2013 Document Reviewed: 10/15/2013 °ExitCare® Patient Information ©2015 [employer]. This information is not intended to replace advice given to you by your health care provider. Make sure you discuss any questions you have with your health care provider. ° °

## 2015-05-07 NOTE — Patient Outreach (Signed)
[facility]) Care Management   05/07/2015  [name] 1946-05-02 [id number]  [name] is an 69 y.o. female  Subjective:   "I'm doing all right.  I'm a little sore.  I fell out of the bed this morning."  Member reports some soreness on the right side, and a slight headache.  Objective:   Review of Systems  Constitutional: Negative.   HENT:       Report she fell this morning   Eyes: Negative.   Respiratory: Negative.   Cardiovascular: Negative.   Gastrointestinal: Positive for diarrhea.  Genitourinary: Negative.   Skin: Negative.   Neurological: Positive for headaches.    Physical Exam  Constitutional: She is oriented to person, place, and time. She appears well-developed and well-nourished.  Neck: Normal range of motion.  Cardiovascular: Normal rate, regular rhythm and normal heart sounds.   Respiratory: Effort normal and breath sounds normal.  GI: Soft. Bowel sounds are normal.  Musculoskeletal: Normal range of motion.  Neurological: She is alert and oriented to person, place, and time.  Skin: Skin is warm and dry.    BP 110/60 mmHg  Pulse 63  Resp 20  Wt 169 lb (76.658 kg)  SpO2 95%   Current Medications:   Current Outpatient Prescriptions  Medication Sig Dispense Refill  . aspirin EC 81 MG tablet Take 81 mg by mouth daily.    . carvedilol (COREG) 25 MG tablet Take 25 mg by mouth 2 (two) times daily with a meal.    . clopidogrel (PLAVIX) 75 MG tablet Take 75 mg by mouth daily.    Marland Kitchen dicyclomine (BENTYL) 20 MG tablet Take 20 mg by mouth 3 (three) times daily before meals.    . DULoxetine (CYMBALTA) 60 MG capsule Take 60 mg by mouth daily.  5  . ferrous sulfate 325 (65 FE) MG tablet Take 1 tablet (325 mg total) by mouth 2 (two) times daily with a meal. 60 tablet 0  . furosemide (LASIX) 40 MG tablet Take 1 tablet (40 mg total) by mouth 2 (two) times daily. 20 tablet 0  . gabapentin (NEURONTIN) 300 MG capsule TAKE 1 CAPSULE (300 MG  TOTAL) BY MOUTH 2 (TWO) TIMES DAILY. 90 capsule 3  . glucose ([facility] GLUCOSE) 4 GM chewable tablet Chew 1 tablet by mouth as needed for low blood sugar.    . insulin aspart (NOVOLOG FLEXPEN) 100 UNIT/ML SOPN FlexPen Inject 1-12 Units into the skin 3 (three) times daily with meals. As needed per sliding scale    . Insulin Glargine (TOUJEO SOLOSTAR) 300 UNIT/ML SOPN Inject 16 Units into the skin at bedtime.    Marland Kitchen ipratropium-albuterol (DUONEB) 0.5-2.5 (3) MG/3ML SOLN Take 3 mLs by nebulization 2 (two) times daily as needed (for shortnes of breath).     . irbesartan (AVAPRO) 300 MG tablet Take 1 tablet (300 mg total) by mouth at bedtime.  0  . isosorbide mononitrate (IMDUR) 30 MG 24 hr tablet Take 1 tablet (30 mg total) by mouth daily. 30 tablet 5  . lansoprazole (PREVACID) 15 MG capsule Take 15 mg by mouth daily at 12 noon.    Marland Kitchen levothyroxine (SYNTHROID, LEVOTHROID) 50 MCG tablet   6  . Multiple Vitamins-Minerals (WOMENS 50+ MULTI VITAMIN/MIN) TABS Take 1 tablet by mouth daily.    . nitroGLYCERIN (NITROSTAT) 0.4 MG SL tablet Place 0.4 mg under the tongue every 5 (five) minutes as needed. For chest pain    . OXYGEN-HELIUM IN Inhale 4 L into  the lungs at bedtime.     . simvastatin (ZOCOR) 40 MG tablet Take 40 mg by mouth every evening.     . sulfamethoxazole-trimethoprim (BACTRIM DS,SEPTRA DS) 800-160 MG per tablet Take 1 tablet by mouth 2 (two) times daily.    Marland Kitchen gabapentin (NEURONTIN) 300 MG capsule TAKE 1 CAPSULE (300 MG TOTAL) BY MOUTH 2 (TWO) TIMES DAILY. (Patient not taking: Reported on 04/06/2015) 90 capsule 3  . Pancrelipase, Lip-Prot-Amyl, (ZENPEP) 20000 UNITS CPEP Take 1 capsule by mouth every morning.    . [DISCONTINUED] dexlansoprazole (DEXILANT) 60 MG capsule Take 60 mg by mouth daily.     No current facility-administered medications for this visit.    Functional Status:   In your present state of health, do you have any difficulty performing the following activities: 04/06/2015 03/09/2015   Hearing? - N  Vision? - Y  Difficulty concentrating or making decisions? - N  Walking or climbing stairs? - Y  Dressing or bathing? - N  Doing errands, shopping? - Y  Preparing Food and eating ? N -  Using the Toilet? N -  In the past six months, have you accidently leaked urine? Y -  Do you have problems with loss of bowel control? Y -  Managing your Medications? N -  Managing your Finances? N -  Housekeeping or managing your Housekeeping? Y -    Fall/Depression Screening:    PHQ 2/9 Scores 05/07/2015 04/06/2015 03/09/2015 01/28/2015  PHQ - 2 Score 0 1 0 0    Assessment:    Met with member at scheduled time.  With the reported fall, this care manager inquired about the member being assessed after the fall.  She states that she did not notify her primary care physician, nor did she go to an urgent care/emergency department to be evaluated.  She reports that she feel ok and will be fine.  Member encouraged to seek further evaluation if pain or discomfort increases.  Member reports that she has been taking 38 units of Toujeo, however, her fasting blood sugar has still been over her target range of 130.  She states that she has been taking 38 units for the past couple weeks.  Member advised per physician orders to increase Toujeo to 42 units this evening, and to monitor her fasting blood sugar for the next 4 days.  Advised that if blood sugar is greater than 130 for those days she should increase her insulin by 4 more units.  Advised to increase Toujeo 4 units every 4 days until fasting blood sugar is less than 130.  Member is due for another A1C, next appointment with endocrinologist scheduled for next month.  Member states she has had more tests done for her continued diarrhea.  She reports the she received a call from Dr. Oletta Lamas office this morning with some test results. She reports that her stool tested positive for C-diff, and prescriptions for Florastor and Bactrim has been called in to  the pharmacy.  She states that she will have her daughter pick up the medications when she get off work.  Member advised of the importance of taking the medications as prescribed.  Member educated on the importance of managing her care with this new diagnosis in effort to decrease the risk of spreading the infection.  Member made aware that this care manager will send further education for C-diff from the [facility] program.  Member denies any further concerns at this time.  Encouraged to contact this care manager with  any questions.  Member verbalizes understanding.  Plan:   Will mail EMMI education regarding C-diff. Routine home visit scheduled for next month.  [facility] CM Care Plan Problem One        Patient Outreach from 05/07/2015 in [place] Problem One  Elevated A1C (9.1)   Care Plan for Problem One  Active   [facility]N Long Term Goal (31-90 days)  Member's A1C will be below target range within the next 60 days   THN Long Term Goal Start Date  05/07/15   Interventions for Problem One Long Term Goal  Discussed the importance of taking medications as prescribed and following suggested diet    [facility] CM Care Plan Problem Two        Patient Outreach from 05/07/2015 in [place] Problem Two  Medication Adherence   Care Plan for Problem Two  Active   Interventions for Problem Two Long Term Goal   Instructions provided for Insulin titrations according to physician   [facility] Long Term Goal (31-90) days  Member will report taking insulin and adjusting dose as prescribed over the next 45 days, keeping blood sugar within given range   [facility]N Long Term Goal Met Date  05/07/15 [Continued education needed and provided]   [facility]N CM Short Term Goal #1 (0-30 days)  Member will titrate insulin (Toujeo) so that fasting blood sugar remain within the given range over the next 4 weeks   [facility]N CM Short Term Goal #1 Start Date  05/07/15 [Orginal goal date not met, start date reset]    Interventions for Short Term Goal #2   Discussed with member thie importance of keeping correct insulin on hand, and not running out   [facility] CM Short Term Goal #2 (0-30 days)  Member will report taking new medications (florastor, Bentyl, and Bactrim) along with other medications for the next 4 weeks   [facility]N CM Short Term Goal #2 Start Date  05/07/15    [place] Problem Three        Patient Outreach from 05/07/2015 in [place] Problem Three  Diarrhea   Care Plan for Problem Three  Not Active     Valente David, BSN, [place] Manager [phone]

## 2015-05-07 NOTE — Patient Outreach (Signed)
Call placed to member to inquire about moving scheduled visit for this afternoon up to this morning due to scheduling conflict.  Member agrees.  Appointment rescheduled for 1130 this morning.  Kemper DurieMonica Zach Tietje, BSN, [facility] Care Management  [facility] Care Manager [phone]

## 2015-06-01 ENCOUNTER — Ambulatory Visit: Payer: Medicare Other | Admitting: Podiatry

## 2015-06-03 ENCOUNTER — Ambulatory Visit (INDEPENDENT_AMBULATORY_CARE_PROVIDER_SITE_OTHER): Payer: Medicare Other | Admitting: Podiatry

## 2015-06-03 ENCOUNTER — Encounter: Payer: Self-pay | Admitting: Podiatry

## 2015-06-03 DIAGNOSIS — B351 Tinea unguium: Secondary | ICD-10-CM | POA: Diagnosis not present

## 2015-06-03 DIAGNOSIS — M79676 Pain in unspecified toe(s): Secondary | ICD-10-CM

## 2015-06-03 NOTE — Patient Instructions (Signed)
Diabetes and Foot Care Diabetes may cause you to have problems because of poor blood supply (circulation) to your feet and legs. This may cause the skin on your feet to become thinner, break easier, and heal more slowly. Your skin may become dry, and the skin may peel and crack. You may also have nerve damage in your legs and feet causing decreased feeling in them. You may not notice minor injuries to your feet that could lead to infections or more serious problems. Taking care of your feet is one of the most important things you can do for yourself.  HOME CARE INSTRUCTIONS  Wear shoes at all times, even in the house. Do not go barefoot. Bare feet are easily injured.  Check your feet daily for blisters, cuts, and redness. If you cannot see the bottom of your feet, use a mirror or ask someone for help.  Wash your feet with warm water (do not use hot water) and mild soap. Then pat your feet and the areas between your toes until they are completely dry. Do not soak your feet as this can dry your skin.  Apply a moisturizing lotion or petroleum jelly (that does not contain alcohol and is unscented) to the skin on your feet and to dry, brittle toenails. Do not apply lotion between your toes.  Trim your toenails straight across. Do not dig under them or around the cuticle. File the edges of your nails with an emery board or nail file.  Do not cut corns or calluses or try to remove them with medicine.  Wear clean socks or stockings every day. Make sure they are not too tight. Do not wear knee-high stockings since they may decrease blood flow to your legs.  Wear shoes that fit properly and have enough cushioning. To break in new shoes, wear them for just a few hours a day. This prevents you from injuring your feet. Always look in your shoes before you put them on to be sure there are no objects inside.  Do not cross your legs. This may decrease the blood flow to your feet.  If you find a minor scrape,  cut, or break in the skin on your feet, keep it and the skin around it clean and dry. These areas may be cleansed with mild soap and water. Do not cleanse the area with peroxide, alcohol, or iodine.  When you remove an adhesive bandage, be sure not to damage the skin around it.  If you have a wound, look at it several times a day to make sure it is healing.  Do not use heating pads or hot water bottles. They may burn your skin. If you have lost feeling in your feet or legs, you may not know it is happening until it is too late.  Make sure your health care provider performs a complete foot exam at least annually or more often if you have foot problems. Report any cuts, sores, or bruises to your health care provider immediately. SEEK MEDICAL CARE IF:   You have an injury that is not healing.  You have cuts or breaks in the skin.  You have an ingrown nail.  You notice redness on your legs or feet.  You feel burning or tingling in your legs or feet.  You have pain or cramps in your legs and feet.  Your legs or feet are numb.  Your feet always feel cold. SEEK IMMEDIATE MEDICAL CARE IF:   There is increasing redness,   swelling, or pain in or around a wound.  There is a red line that goes up your leg.  Pus is coming from a wound.  You develop a fever or as directed by your health care provider.  You notice a bad smell coming from an ulcer or wound. Document Released: 10/07/2000 Document Revised: 06/12/2013 Document Reviewed: 03/19/2013 ExitCare Patient Information 2015 [employer]. This information is not intended to replace advice given to you by your health care provider. Make sure you discuss any questions you have with your health care provider.  

## 2015-06-04 NOTE — Progress Notes (Signed)
Patient ID: [name], female   DOB: 10/29/1945, 69 y.o.   MRN: [id number]  Subjective: This patient presents today complaining of painful toenails and requests toenail debridement  Objective: Toenails 2-5 are hypertrophic, elongated, brittle and tender direct palpation 6-10. There is no surrounding erythema, warmth or drainage around the nail plates  Assessment: Symptomatic onychomycoses 8 History of type II diabetic uncontrolled History of peripheral vascular disease  Plan: Debridement of toenails 8 mechanically and electrically without any bleeding  Reappoint 3 months

## 2015-06-08 ENCOUNTER — Other Ambulatory Visit: Payer: Self-pay | Admitting: *Deleted

## 2015-06-08 ENCOUNTER — Encounter: Payer: Self-pay | Admitting: *Deleted

## 2015-06-08 NOTE — Patient Outreach (Signed)
[facility]) Care Management   06/08/2015  [name] January 20, 1946 [id number]  [name] is an 69 y.o. female  Subjective:   Objective:   Review of Systems  Constitutional: Negative.   HENT: Negative.   Eyes: Negative.   Respiratory: Negative.   Cardiovascular: Negative.   Gastrointestinal: Negative.   Genitourinary: Negative.   Musculoskeletal: Positive for joint pain.  Skin: Negative.   Neurological: Negative.   Endo/Heme/Allergies: Negative.   Psychiatric/Behavioral: Negative.     Physical Exam  Constitutional: She is oriented to person, place, and time. She appears well-developed and well-nourished.  Neck: Normal range of motion.  Cardiovascular: Normal rate, regular rhythm and normal heart sounds.   Respiratory: Effort normal and breath sounds normal.  GI: Soft. Bowel sounds are normal.  Musculoskeletal: Normal range of motion.  Neurological: She is alert and oriented to person, place, and time.  Skin: Skin is warm and dry.   BP 140/78 mmHg  Pulse 65  Resp 18  Wt 169 lb (76.658 kg)  SpO2 94%  Current Medications:   Current Outpatient Prescriptions  Medication Sig Dispense Refill  . aspirin EC 81 MG tablet Take 81 mg by mouth daily.    . carvedilol (COREG) 25 MG tablet Take 25 mg by mouth 2 (two) times daily with a meal.    . clopidogrel (PLAVIX) 75 MG tablet Take 75 mg by mouth daily.    Marland Kitchen dicyclomine (BENTYL) 20 MG tablet Take 20 mg by mouth 3 (three) times daily before meals.    . DULoxetine (CYMBALTA) 60 MG capsule Take 60 mg by mouth daily.  5  . ferrous sulfate 325 (65 FE) MG tablet Take 1 tablet (325 mg total) by mouth 2 (two) times daily with a meal. 60 tablet 0  . furosemide (LASIX) 40 MG tablet Take 1 tablet (40 mg total) by mouth 2 (two) times daily. 20 tablet 0  . gabapentin (NEURONTIN) 300 MG capsule TAKE 1 CAPSULE (300 MG TOTAL) BY MOUTH 2 (TWO) TIMES DAILY. 90 capsule 3  . glucose ([employer] GLUCOSE) 4 GM chewable tablet  Chew 1 tablet by mouth as needed for low blood sugar.    . HYDROcodone-acetaminophen (NORCO/VICODIN) 5-325 MG per tablet Take 1 tablet by mouth every 6 (six) hours as needed for moderate pain.    Marland Kitchen insulin aspart (NOVOLOG FLEXPEN) 100 UNIT/ML SOPN FlexPen Inject 1-12 Units into the skin 3 (three) times daily with meals. As needed per sliding scale    . Insulin Glargine (TOUJEO SOLOSTAR) 300 UNIT/ML SOPN Inject 16 Units into the skin at bedtime.    Marland Kitchen ipratropium-albuterol (DUONEB) 0.5-2.5 (3) MG/3ML SOLN Take 3 mLs by nebulization 2 (two) times daily as needed (for shortnes of breath).     . irbesartan (AVAPRO) 300 MG tablet Take 1 tablet (300 mg total) by mouth at bedtime.  0  . isosorbide mononitrate (IMDUR) 30 MG 24 hr tablet Take 1 tablet (30 mg total) by mouth daily. 30 tablet 5  . lansoprazole (PREVACID) 15 MG capsule Take 15 mg by mouth daily at 12 noon.    Marland Kitchen levothyroxine (SYNTHROID, LEVOTHROID) 50 MCG tablet   6  . Multiple Vitamins-Minerals (WOMENS 50+ MULTI VITAMIN/MIN) TABS Take 1 tablet by mouth daily.    . nitroGLYCERIN (NITROSTAT) 0.4 MG SL tablet Place 0.4 mg under the tongue every 5 (five) minutes as needed. For chest pain    . OXYGEN-HELIUM IN Inhale 4 L into the lungs at bedtime.     Marland Kitchen  simvastatin (ZOCOR) 40 MG tablet Take 40 mg by mouth every evening.     . gabapentin (NEURONTIN) 300 MG capsule TAKE 1 CAPSULE (300 MG TOTAL) BY MOUTH 2 (TWO) TIMES DAILY. (Patient not taking: Reported on 06/08/2015) 90 capsule 3  . Pancrelipase, Lip-Prot-Amyl, (ZENPEP) 20000 UNITS CPEP Take 1 capsule by mouth every morning.    . sulfamethoxazole-trimethoprim (BACTRIM DS,SEPTRA DS) 800-160 MG per tablet Take 1 tablet by mouth 2 (two) times daily.    . [DISCONTINUED] dexlansoprazole (DEXILANT) 60 MG capsule Take 60 mg by mouth daily.     No current facility-administered medications for this visit.    Functional Status:   In your present state of health, do you have any difficulty performing the  following activities: 04/06/2015 03/09/2015  Hearing? - N  Vision? - Y  Difficulty concentrating or making decisions? - N  Walking or climbing stairs? - Y  Dressing or bathing? - N  Doing errands, shopping? - Y  Preparing Food and eating ? N -  Using the Toilet? N -  In the past six months, have you accidently leaked urine? Y -  Do you have problems with loss of bowel control? Y -  Managing your Medications? N -  Managing your Finances? N -  Housekeeping or managing your Housekeeping? Y -    Fall/Depression Screening:    PHQ 2/9 Scores 05/07/2015 04/06/2015 03/09/2015 01/28/2015  PHQ - 2 Score 0 1 0 0    Assessment:    Met with member at scheduled time.  Member reports that she has been doing well since the last visit.  She reports that she has seen several physicians, including primary, gastroenterologist, endocrinologist, and ENT, all since the last home visit.  She reports that she is no longer having any issues with diarrhea, but that Dr. Buddy Duty is still concerned with her elevated blood sugar trends and elevated A1C.  She states that her daily Toujeo has now been increased to 52 units, and that she is still to increase by 4 units every 4 days if her fasting glucose is greater than 130.  She verbalizes understanding of increasing her insulin, however expresses concern regarding eating 3 meals/day.  She states that she was told at the physician office to attempt at least 2 meals with several snacks in between.  She reports that she is able to comply with that request and will be able to monitor her blood sugar and take her novolog as prescribed, 3 times a day.  She continues to check her blood pressure and weights, both which have been well maintained.  Member expresses interest in becoming involved with resources where she can volunteer and become more active in the community.  Made aware that this care manager will contact social worker for list of resources.  Member denies any further concerns  at this time.  Encouraged to contact this care manager with any questions.  Plan:   Will contact social worker to request community resources for member to volunteer. Routine home visit scheduled for next month.  [facility] CM Care Plan Problem One        Patient Outreach from 06/08/2015 in [place] Problem One  Elevated A1C (9.1)   Care Plan for Problem One  Active   THN Long Term Goal (31-90 days)  Member's A1C will be below target range within the next 60 days   THN Long Term Goal Start Date  05/07/15   Interventions for Problem One  Long Term Goal  Discussed the importance of taking medications as prescribed and following suggested diet    [facility] CM Care Plan Problem Two        Patient Outreach from 06/08/2015 in [place] Problem Two  Medication Adherence   Care Plan for Problem Two  Active   Interventions for Problem Two Long Term Goal   Instructions provided for Insulin titrations according to physician   [employer] [facility] [employer] Long Term Goal (31-90) days  Member will report taking insulin and adjusting dose as prescribed over the next 45 days, keeping blood sugar within given range   THN Long Term Goal Met Date  05/07/15 [Continued education needed and provided]   THN CM Short Term Goal #1 (0-30 days)  Member will titrate insulin (Toujeo) so that fasting blood sugar remain within the given range over the next 4 weeks   THN CM Short Term Goal #1 Start Date  06/08/15 Maylon Peppers continues to have inconsistencies, date reset]   Interventions for Short Term Goal #2   Discussed with member thie importance of keeping correct insulin on hand, and not running out   THN CM Short Term Goal #2 (0-30 days)  Member will report taking new medications (florastor, Bentyl, and Bactrim) along with other medications for the next 4 weeks   THN CM Short Term Goal #2 Start Date  05/07/15   [facility] CM Short Term Goal #2 Met Date  06/08/15    [facility] CM Care Plan Problem Three        Patient  Outreach from 05/07/2015 in [place] Problem Three  Diarrhea   Care Plan for Problem Three  Not Active     Valente David, BSN, [place] Manager [phone]

## 2015-07-06 ENCOUNTER — Encounter: Payer: Self-pay | Admitting: *Deleted

## 2015-07-06 ENCOUNTER — Other Ambulatory Visit: Payer: Self-pay | Admitting: *Deleted

## 2015-07-06 NOTE — Patient Outreach (Signed)
[facility]) Care Management   07/06/2015  [name] 1946/03/04 [id number]  [name] is an 69 y.o. female  Subjective:   Member reports that she is doing "alright."  She states that her blood sugars have been "a little up" due to the fact that she ran out of insulin (Toujeo) for almost 2 weeks.  She states that she had to pay an old bill and made the decision to pay the bill instead of getting her insulin.  She states that she thought since she was still taking her Novolog daily that her blood sugars would be fine.  She does report that she has continued to weigh herself daily, recording readings, and take her blood pressure several times a week.    Objective:   Review of Systems  Constitutional: Negative.   HENT: Negative.   Eyes: Negative.   Respiratory: Negative.   Cardiovascular: Negative.   Gastrointestinal: Negative.   Genitourinary: Negative.   Musculoskeletal: Positive for back pain and joint pain.  Skin: Negative.   Neurological: Negative.   Endo/Heme/Allergies: Negative.   Psychiatric/Behavioral: Negative.     Physical Exam  Constitutional: She is oriented to person, place, and time. She appears well-developed and well-nourished.  Neck: Normal range of motion.  Cardiovascular: Normal rate and regular rhythm.   Respiratory: Effort normal and breath sounds normal.  GI: Soft. Bowel sounds are normal.  Musculoskeletal: Normal range of motion.  Neurological: She is alert and oriented to person, place, and time.  Skin: Skin is warm and dry.   BP 138/82 mmHg  Pulse 74  Resp 18  Wt 170 lb (77.111 kg)  SpO2 95%  Current Medications:   Current Outpatient Prescriptions  Medication Sig Dispense Refill  . aspirin EC 81 MG tablet Take 81 mg by mouth daily.    . carvedilol (COREG) 25 MG tablet Take 25 mg by mouth 2 (two) times daily with a meal.    . clopidogrel (PLAVIX) 75 MG tablet Take 75 mg by mouth daily.    Marland Kitchen dicyclomine (BENTYL) 20 MG  tablet Take 20 mg by mouth 3 (three) times daily before meals.    . DULoxetine (CYMBALTA) 60 MG capsule Take 60 mg by mouth daily.  5  . ferrous sulfate 325 (65 FE) MG tablet Take 1 tablet (325 mg total) by mouth 2 (two) times daily with a meal. 60 tablet 0  . furosemide (LASIX) 40 MG tablet Take 1 tablet (40 mg total) by mouth 2 (two) times daily. 20 tablet 0  . gabapentin (NEURONTIN) 300 MG capsule TAKE 1 CAPSULE (300 MG TOTAL) BY MOUTH 2 (TWO) TIMES DAILY. 90 capsule 3  . glucose ([facility] GLUCOSE) 4 GM chewable tablet Chew 1 tablet by mouth as needed for low blood sugar.    . HYDROcodone-acetaminophen (NORCO/VICODIN) 5-325 MG per tablet Take 1 tablet by mouth every 6 (six) hours as needed for moderate pain.    Marland Kitchen insulin aspart (NOVOLOG FLEXPEN) 100 UNIT/ML SOPN FlexPen Inject 1-12 Units into the skin 3 (three) times daily with meals. As needed per sliding scale    . Insulin Glargine (TOUJEO SOLOSTAR) 300 UNIT/ML SOPN Inject 16 Units into the skin at bedtime.    Marland Kitchen ipratropium-albuterol (DUONEB) 0.5-2.5 (3) MG/3ML SOLN Take 3 mLs by nebulization 2 (two) times daily as needed (for shortnes of breath).     . irbesartan (AVAPRO) 300 MG tablet Take 1 tablet (300 mg total) by mouth at bedtime.  0  . isosorbide mononitrate (  IMDUR) 30 MG 24 hr tablet Take 1 tablet (30 mg total) by mouth daily. 30 tablet 5  . lansoprazole (PREVACID) 15 MG capsule Take 15 mg by mouth daily at 12 noon.    Marland Kitchen levothyroxine (SYNTHROID, LEVOTHROID) 50 MCG tablet   6  . Multiple Vitamins-Minerals (WOMENS 50+ MULTI VITAMIN/MIN) TABS Take 1 tablet by mouth daily.    . nitroGLYCERIN (NITROSTAT) 0.4 MG SL tablet Place 0.4 mg under the tongue every 5 (five) minutes as needed. For chest pain    . OXYGEN-HELIUM IN Inhale 4 L into the lungs at bedtime.     . simvastatin (ZOCOR) 40 MG tablet Take 40 mg by mouth every evening.     . gabapentin (NEURONTIN) 300 MG capsule TAKE 1 CAPSULE (300 MG TOTAL) BY MOUTH 2 (TWO) TIMES DAILY. (Patient not  taking: Reported on 06/08/2015) 90 capsule 3  . Pancrelipase, Lip-Prot-Amyl, (ZENPEP) 20000 UNITS CPEP Take 1 capsule by mouth every morning.    . sulfamethoxazole-trimethoprim (BACTRIM DS,SEPTRA DS) 800-160 MG per tablet Take 1 tablet by mouth 2 (two) times daily.    . [DISCONTINUED] dexlansoprazole (DEXILANT) 60 MG capsule Take 60 mg by mouth daily.     No current facility-administered medications for this visit.    Functional Status:   In your present state of health, do you have any difficulty performing the following activities: 04/06/2015 03/09/2015  Hearing? - N  Vision? - Y  Difficulty concentrating or making decisions? - N  Walking or climbing stairs? - Y  Dressing or bathing? - N  Doing errands, shopping? - Y  Preparing Food and eating ? N -  Using the Toilet? N -  In the past six months, have you accidently leaked urine? Y -  Do you have problems with loss of bowel control? Y -  Managing your Medications? N -  Managing your Finances? N -  Housekeeping or managing your Housekeeping? Y -    Fall/Depression Screening:    PHQ 2/9 Scores 05/07/2015 04/06/2015 03/09/2015 01/28/2015  PHQ - 2 Score 0 1 0 0    Assessment:    Member instructed to contact this care manager if she is ever in need of a medication such as insulin.  Discussed the importance of taking it everyday in order to better manage her diabetes.  While off of her Toujeo, her blood sugars range from 200s -300s.  Since being back on it, her readings have been between 150s-200s.  Endocrinologist office called to obtain most recent A1C, which is 9.7 and was taken on 6/20.  Member has appointment next week, encouraged to make physician aware of the time frame without Toujeo, and to take her meter with her.  She verbalizes understanding.    Member reports that she received injections in her knees and her back, which has decreased her pain level.  She states that she is now able to walk better and has started trying to do an  exercise regime with a television program for the elderly.  Member encouraged to increase activity as tolerated and to continue to monitor diet.  Member verbalizes understanding.  She denies any concerns at this time, encouraged to contact this care manager with any questions.  Plan:   Routine Home visit scheduled for next month.  THN CM Care Plan Problem One        Most Recent Value   Care Plan Problem One  Elevated A1C (9.1)   Role Documenting the Problem One  Care Management Coordinator  Care Plan for Problem One  Active   THN Long Term Goal (31-90 days)  Member's A1C will be below target range within the next 90 days   THN Long Term Goal Start Date  05/07/15   Interventions for Problem One Long Term Goal  Discussed the importance of taking medications as prescribed and following suggested diet    [facility]N CM Care Plan Problem Two        Most Recent Value   Care Plan Problem Two  Medication Adherence   Role Documenting the Problem Two  Care Management Coordinator   Care Plan for Problem Two  Active   Interventions for Problem Two Long Term Goal   Instructions provided for Insulin titrations according to physician   THN Long Term Goal (31-90) days  Member will report taking insulin and adjusting dose as prescribed over the next 45 days, keeping blood sugar within given range   THN Long Term Goal Start Date  07/06/15   [facility] CM Short Term Goal #1 (0-30 days)  Member will titrate insulin (Toujeo) so that fasting blood sugar remain within the given range over the next 4 weeks   [facility]N CM Short Term Goal #1 Start Date  07/06/15 Aleen Campi continues to have inconsistencies, date reset]   Interventions for Short Term Goal #2   Discussed with member thie importance of keeping correct insulin on hand, and not running out     [employer], BSN, [facility] Care Management  [facility] Care Manager [phone]

## 2015-07-16 ENCOUNTER — Other Ambulatory Visit: Payer: Self-pay | Admitting: *Deleted

## 2015-07-16 DIAGNOSIS — I6523 Occlusion and stenosis of bilateral carotid arteries: Secondary | ICD-10-CM

## 2015-07-16 DIAGNOSIS — I739 Peripheral vascular disease, unspecified: Secondary | ICD-10-CM

## 2015-07-16 DIAGNOSIS — Z48812 Encounter for surgical aftercare following surgery on the circulatory system: Secondary | ICD-10-CM

## 2015-07-19 NOTE — Progress Notes (Signed)
Cardiology Office Note   Date:  07/20/2015   ID:  SHERRINE SALBERG, DOB 07-Jul-1946, MRN [id number]  PCP:  Cala Bradford, MD  Cardiologist:  Dr. Donato Schultz   Electrophysiologist:  n/a  Chief Complaint  Patient presents with  . Congestive Heart Failure  . Coronary Artery Disease     History of Present Illness: DREAMER CARILLO is a 69 y.o. female with a hx of CAD status post CABG, ischemic cardiomyopathy, chronic systolic CHF, COPD, diabetes, PAD, HL, CKD stage III, HTN. EF has been as low as 20% in the past. Most recent echo in 3/16 demonstrated EF 50%. She is on chronic O2 secondary to COPD and systolic HF. She is status post left LE bypass by Dr. Darrick Penna. 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 Last seen by Dr. Anne Fu 5/16.  She returns for follow-up. Here alone.  She is feeling ok.  She notes her weight is up and she does note increased abdominal girth.  She has some mild edema.  She has chronic SOB. She feels like her breathing is stable. She notes chronic cough and wheezing without much change.  She denies orthopnea.  She has noted some symptoms of ? PND recently.  No syncope.  She denies chest pain.     Studies/Reports Reviewed Today:  Carotid US 4/16 R 40-59%; L CEA patent with 40-59%  Myoview 3/16 Final Impression: High risk nuclear stress test. LVEF 25%, dilated LV with severe global hypokinesis and anteroapical akinesis. There is a moderate-sized and intensity, partially reversible anterior perfusion defect which could represent scar with peri-infarct ischemia or significant hybernating myocardium.  LHC 3/16 EF 45%, inf HK LM:  Ok LAD:  Ostial 100% LCx:  Ostial 40%, branch vessel 50% (not amenable to PCI) RCA:  prox 100% after RV, 90% prior to RV branch, RV branch with 90% S-RCA:  Chronically occluded S-mLAD:  Patent, inferoapical LAD beyond graft 70% POST-OPERATIVE DIAGNOSIS:   Severe Native & graft CAD with 100% RCA, LAD & small AV  Groove Cx along with SVG-RCA  Widely patent SVG-distal Mid Wraparound LAD with severe diffuse proximal LAD disease limiting retrograde flow.  Widely patent native Ramus vs. Large Lateral OM with 2 large bifurcating branches that have diffuse moderate disease.  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  Continue Med Rx of Combined Systolic & Diastolic HF - needs afterload reduction & aggressive diuresis based upon elevated LVEDP & RHC pressures.  Medical management of CAD - no PCI option on SVG-RCA or native RCA.  Defer further plans to primary cardiology team   Echo 3/16 Inf-lat HK, inf HK, Mod LVH, EF 50%, Ao sclerosis, mild LAE, mild to mod reduced RVSF, mild RAE, PASP 35 mmHg    Past Medical History  Diagnosis Date  . Diabetes mellitus     on Lantus 30 U   . CAD (coronary artery disease)     a. S/p CABG and stenting b. L&R cath 01/09/15 LM patent, LAD 100% occluded, LCX moderate disease, RCA 100% occluded. SVG-RCA CTO, SVG-mid LAD patent with more distal LAD 70% stenosis. Recs for medical management.   Marland Kitchen PAD (peripheral artery disease)   . Stroke     MRI 11/2011  with remote occipital lobe. MRA with moderate left focal vertebral artery stenosis  . Chronic combined systolic and diastolic CHF (congestive heart failure)     a. 11/2011 Echo with EF 30-35%, global hypokinesis, and inferior akinesis;  b. 12/2014 Echo: EF 50%, mod LVH, Ao sclerosis w/o stenosis, mildly dil LA, mild to mod RV dysfxn.  . Hyperlipidemia   . DDD (degenerative disc disease), lumbar   . MI (myocardial infarction) 1997  . COPD (chronic obstructive pulmonary disease)     a. prn and HS supplemental O2.  . Anemia   . Hypertension     . GERD (gastroesophageal reflux disease)   . History of IBS   . Hypothyroidism     Goiter  . Carotid artery occlusion   . CKD (chronic kidney disease), stage III     stage 3  . History of tracheostomy 08/26/2013    Dr. Jenne Pane    Past Surgical History  Procedure Laterality Date  . Ptca    . Thyroidectomy    . Coronary artery bypass graft      2 vessel  . Carotid endarterectomy  ~2008    Left   . Cholecystectomy    . Tracheostomy tube placement  01/02/2012  . Angioplasty  [id number]    Aortogram by Dr. [place] McKenzie [facility])  . Pr vein bypass graft,aorto-fem-pop      Right common femoral-AK popliteal BPG & Right Popliteal-posterior tibial  . Pr vein bypass graft,aorto-fem-pop      Left Fem-pop BPG  . Carpal tunnel release Right   . Femoral-tibial bypass graft Left 06/11/2013    Procedure: BYPASS GRAFT  LEFT FEMORAL- POSTERIOR TIBIAL ARTERY/ REDO;  Surgeon: Sherren Kerns, MD;  Location: [facility] OR;  Service: Vascular;  Laterality: Left;  . Thrombectomy femoral artery Left 06/11/2013    Procedure: THROMBECTOMY FEMORAL ARTERY;  Surgeon: Sherren Kerns, MD;  Location: [facility] OR;  Service: Vascular;  Laterality: Left;  . Spine surgery  Oct. 27, 2014    Injection - Back  . Abdominal aortagram N/A 04/05/2013    Procedure: ABDOMINAL Ronny Flurry;  Surgeon: Sherren Kerns, MD;  Location: [facility] CATH LAB;  Service: Cardiovascular;  Laterality: N/A;  . Colonoscopy N/A 10/27/2014    Procedure: COLONOSCOPY;  Surgeon: Willis Modena, MD;  Location: [facility] ENDOSCOPY;  Service: Endoscopy;  Laterality: N/A;  . Left and right heart catheterization with coronary angiogram N/A 01/09/2015    Procedure: LEFT AND RIGHT HEART CATHETERIZATION WITH CORONARY ANGIOGRAM;  Surgeon: Marykay Lex, MD;  Location: [facility] CATH LAB;  Service: Cardiovascular;  Laterality: N/A;     Current Outpatient Prescriptions  Medication Sig Dispense Refill  . aspirin EC 81 MG tablet Take 81 mg by mouth daily.    . carvedilol (COREG) 25  MG tablet Take 25 mg by mouth 2 (two) times daily with a meal.    . clopidogrel (PLAVIX) 75 MG tablet Take 75 mg by mouth daily.    Marland Kitchen dicyclomine (BENTYL) 20 MG tablet Take 20 mg by mouth 3 (three) times daily before meals.    . DULoxetine (CYMBALTA) 60 MG capsule Take 60 mg by mouth daily.  5  . ferrous sulfate 325 (65 FE) MG tablet Take 1 tablet (325 mg total) by mouth 2 (two) times daily with a meal. 60 tablet 0  . furosemide (LASIX) 40 MG tablet Take 1 tablet (40 mg total) by mouth 2 (two) times daily. 20 tablet 0  . gabapentin (NEURONTIN) 300 MG capsule TAKE 1 CAPSULE (  300 MG TOTAL) BY MOUTH 2 (TWO) TIMES DAILY. 90 capsule 3  . gabapentin (NEURONTIN) 300 MG capsule TAKE 1 CAPSULE (300 MG TOTAL) BY MOUTH 2 (TWO) TIMES DAILY. 90 capsule 3  . glucose ([facility] GLUCOSE) 4 GM chewable tablet Chew 1 tablet by mouth as needed for low blood sugar.    . HYDROcodone-acetaminophen (NORCO/VICODIN) 5-325 MG per tablet Take 1 tablet by mouth every 6 (six) hours as needed for moderate pain.    Marland Kitchen insulin aspart (NOVOLOG FLEXPEN) 100 UNIT/ML SOPN FlexPen Inject 1-12 Units into the skin 3 (three) times daily with meals. As needed per sliding scale    . Insulin Glargine (TOUJEO SOLOSTAR) 300 UNIT/ML SOPN Inject 16 Units into the skin at bedtime.    Marland Kitchen ipratropium-albuterol (DUONEB) 0.5-2.5 (3) MG/3ML SOLN Take 3 mLs by nebulization 2 (two) times daily as needed (for shortnes of breath).     . irbesartan (AVAPRO) 300 MG tablet Take 1 tablet (300 mg total) by mouth at bedtime.  0  . isosorbide mononitrate (IMDUR) 30 MG 24 hr tablet Take 1 tablet (30 mg total) by mouth daily. 30 tablet 5  . lansoprazole (PREVACID) 15 MG capsule Take 15 mg by mouth daily at 12 noon.    Marland Kitchen levothyroxine (SYNTHROID, LEVOTHROID) 50 MCG tablet   6  . Multiple Vitamins-Minerals (WOMENS 50+ MULTI VITAMIN/MIN) TABS Take 1 tablet by mouth daily.    . nitroGLYCERIN (NITROSTAT) 0.4 MG SL tablet Place 0.4 mg under the tongue every 5 (five) minutes as  needed. For chest pain    . OXYGEN-HELIUM IN Inhale 4 L into the lungs at bedtime.     . Pancrelipase, Lip-Prot-Amyl, (ZENPEP) 20000 UNITS CPEP Take 1 capsule by mouth every morning.    . simvastatin (ZOCOR) 40 MG tablet Take 40 mg by mouth every evening.     . sulfamethoxazole-trimethoprim (BACTRIM DS,SEPTRA DS) 800-160 MG per tablet Take 1 tablet by mouth 2 (two) times daily.    . [DISCONTINUED] dexlansoprazole (DEXILANT) 60 MG capsule Take 60 mg by mouth daily.     No current facility-administered medications for this visit.    Allergies:   Aldactone; Lisinopril; Crestor; and Vicodin    Social History:  The patient  reports that she quit smoking about 4 years ago. Her smoking use included Cigarettes. She has a 50 pack-year smoking history. She has never used smokeless tobacco. She reports that she does not drink alcohol or use illicit drugs.   Family History:  The patient's family history includes Alzheimer's disease in her mother; Diabetes in her daughter; Heart disease in her mother; Hyperlipidemia in her mother; Hypertension in her daughter; Irregular heart beat in her mother; Other in her mother.    ROS:   Please see the history of present illness.   Review of Systems  Constitution: Positive for malaise/fatigue and weight gain.  HENT: Positive for headaches.   Eyes: Positive for visual disturbance.  Cardiovascular: Positive for dyspnea on exertion and leg swelling.  Respiratory: Positive for cough and shortness of breath.   Musculoskeletal: Positive for back pain and joint pain.  Gastrointestinal: Positive for diarrhea.  Psychiatric/Behavioral: Positive for depression. The patient is nervous/anxious.   All other systems reviewed and are negative.     PHYSICAL EXAM: VS:  BP 142/58 mmHg  Pulse 69  Ht  (1.575 m)  Wt 184 lb (83.462 kg)  BMI 33.65 kg/m2  SpO2 92%    Wt Readings from Last 3 Encounters:  07/20/15 184 lb (83.462  kg)  07/06/15 170 lb (77.111 kg)    06/08/15 169 lb (76.658 kg)     GEN: Well nourished, well developed, in no acute distress HEENT: normal Neck:  Trach collar in place, difficult to assess JVD but appears elevated, no masses Cardiac:  Normal S1/S2, RRR; no murmur ,  no rubs or gallops, trace bilateral LE edema  L > R Respiratory:  Decreased breath sounds bilaterally, crackles at the bases bilaterally  GI: soft, nontender, nondistended, + BS MS: no deformity or atrophy Skin: warm and dry  Neuro:  CNs II-XII intact, Strength and sensation are intact Psych: Normal affect   EKG:  EKG is ordered today.  It demonstrates:   NSR, HR 69, normal axis, first-degree AV block (PR 246 ms), Q waves V1-V2, PVCs, IVCD, QTc 480 ms, no change from prior tracing   Recent Labs: 01/06/2015: B Natriuretic Peptide 1061.8* 01/07/2015: ALT 18 01/09/2015: Hemoglobin 10.4*; Platelets 197 01/13/2015: BUN 41*; Creatinine, Ser 1.37*; Magnesium 2.6*; Potassium 5.6*; Sodium 138    Lipid Panel    Component Value Date/Time   CHOL 134 01/07/2015 0543   TRIG 122 01/07/2015 0543   HDL 36* 01/07/2015 0543   CHOLHDL 3.7 01/07/2015 0543   VLDL 24 01/07/2015 0543   LDLCALC 74 01/07/2015 0543      ASSESSMENT AND PLAN:  1. Acute on Chronic Systolic CHF:  Her weight is up 12 lbs since last seen and she notes increased abdominal girth. She has some crackles in her bases on exam.  However, her breathing is stable.   I will increase her Lasix to 80 mg in A and 40 mg in P x 1 week.  Check BMET today and repeat in 1 week.  If her weight is no better or her abdominal distention continues, consider taking higher dose of diuretic for longer.    2. Ischemic Cardiomyopathy:   Recent EF 50%.  Continue beta-blocker, angiotensin receptor blocker, nitrates.   3. CAD S/P CABG:  Status post myocardial infarction in 1997 and subsequent CABG. Cardiac catheterization 3/16 with severe native three-vessel disease, occluded SVG-RCA and patent SVG-LAD. Anatomy was felt to be  stable medical therapy was continued.  She denies anginal symptoms.  Continue ASA, Plavix, statin, beta-blocker.  4. Subglottis Stenosis Status Post Tracheostomy: Followed by Dr. Jenne Pane.  5. HTN:  BP elevated.  Adjust Lasix as noted. Continue other medications as listed.   6. Hyperlipidemia:  Continue statin.   7. COPD: On home O2.  FU with Pulmonology as directed.  8. PAD: Followed by Dr. Darrick Penna.     Medication Changes: Current medicines are reviewed at length with the patient today.  Concerns regarding medicines are as outlined above.  The following changes have been made:   Discontinued Medications   No medications on file   Modified Medications   No medications on file   New Prescriptions   No medications on file   Labs/ tests ordered today include:   Orders Placed This Encounter  Procedures  . Basic Metabolic Panel (BMET)  . Basic Metabolic Panel (BMET)  . EKG 12-Lead     Disposition:    FU with me 1 month.     Signed, Brynda Rim, MHS 07/20/2015 1:04 PM    [facility] [address] [place], [place], [place]  [address] Phone: [phone]; Fax: [phone]

## 2015-07-20 ENCOUNTER — Encounter: Payer: Self-pay | Admitting: Physician Assistant

## 2015-07-20 ENCOUNTER — Ambulatory Visit (INDEPENDENT_AMBULATORY_CARE_PROVIDER_SITE_OTHER): Payer: Medicare Other | Admitting: Physician Assistant

## 2015-07-20 VITALS — BP 142/58 | HR 69 | Ht 62.0 in | Wt 184.0 lb

## 2015-07-20 DIAGNOSIS — I739 Peripheral vascular disease, unspecified: Secondary | ICD-10-CM

## 2015-07-20 DIAGNOSIS — Z930 Tracheostomy status: Secondary | ICD-10-CM | POA: Diagnosis not present

## 2015-07-20 DIAGNOSIS — I10 Essential (primary) hypertension: Secondary | ICD-10-CM

## 2015-07-20 DIAGNOSIS — I2510 Atherosclerotic heart disease of native coronary artery without angina pectoris: Secondary | ICD-10-CM

## 2015-07-20 DIAGNOSIS — Z9889 Other specified postprocedural states: Secondary | ICD-10-CM

## 2015-07-20 DIAGNOSIS — J449 Chronic obstructive pulmonary disease, unspecified: Secondary | ICD-10-CM

## 2015-07-20 DIAGNOSIS — I255 Ischemic cardiomyopathy: Secondary | ICD-10-CM

## 2015-07-20 DIAGNOSIS — I5023 Acute on chronic systolic (congestive) heart failure: Secondary | ICD-10-CM | POA: Diagnosis not present

## 2015-07-20 DIAGNOSIS — I259 Chronic ischemic heart disease, unspecified: Secondary | ICD-10-CM

## 2015-07-20 DIAGNOSIS — E785 Hyperlipidemia, unspecified: Secondary | ICD-10-CM

## 2015-07-20 DIAGNOSIS — I5022 Chronic systolic (congestive) heart failure: Secondary | ICD-10-CM | POA: Diagnosis not present

## 2015-07-20 LAB — BASIC METABOLIC PANEL
BUN: 42 mg/dL — ABNORMAL HIGH (ref 6–23)
CALCIUM: 8.5 mg/dL (ref 8.4–10.5)
CO2: 29 mEq/L (ref 19–32)
Chloride: 108 mEq/L (ref 96–112)
Creatinine, Ser: 1.33 mg/dL — ABNORMAL HIGH (ref 0.40–1.20)
GFR: 50.82 mL/min — ABNORMAL LOW (ref >60.00)
Glucose, Bld: 236 mg/dL — ABNORMAL HIGH (ref 70–99)
Potassium: 4.1 mEq/L (ref 3.5–5.1)
SODIUM: 144 meq/L (ref 135–145)

## 2015-07-20 NOTE — Patient Instructions (Signed)
Medication Instructions:  1. INCREASE LASIX TO 80 MG IN THE AM AND 40 MG IN THE PM FOR 7 DAYS THEN RESUME LASIX 40 MG TWICE DAILY  Labwork: 1. TODAY BMET  2. BMET IN 1 WEEK  Testing/Procedures:   Follow-Up: SCOTT WEAVER, PAC 1 MONTH  Any Other Special Instructions Will Be Listed Below (If Applicable).  CALL IF WEIGHT NOT BACK TO BASELINE 170 LB AFTER THE 1 WEEK OF THE INCREASED DOSE OF LASIX OR IF STOMACH STILL FEELS BLOATED [phone]

## 2015-07-21 ENCOUNTER — Telehealth: Payer: Self-pay | Admitting: *Deleted

## 2015-07-21 NOTE — Telephone Encounter (Signed)
Pt notified of lab results by phone with verbal understanding.  

## 2015-07-24 ENCOUNTER — Ambulatory Visit: Payer: Medicare Other | Admitting: *Deleted

## 2015-07-24 ENCOUNTER — Other Ambulatory Visit: Payer: Self-pay | Admitting: *Deleted

## 2015-07-24 NOTE — Patient Outreach (Signed)
Call placed to member to schedule routine home visit for next month to follow up on her diabetes management.  Routine home visit scheduled, member encouraged to contact this care manager with any questions or concerns.  Kemper Durie, BSN, [facility] Care Management  [facility] Care Manager [phone]

## 2015-07-27 ENCOUNTER — Inpatient Hospital Stay (HOSPITAL_COMMUNITY)
Admission: EM | Admit: 2015-07-27 | Discharge: 2015-07-30 | DRG: 291 | Disposition: A | Discharge status: Home / Self Care | Payer: Medicare Other | Attending: Internal Medicine | Admitting: Internal Medicine

## 2015-07-27 ENCOUNTER — Other Ambulatory Visit: Payer: Medicare Other

## 2015-07-27 ENCOUNTER — Emergency Department (HOSPITAL_COMMUNITY): Payer: Medicare Other

## 2015-07-27 ENCOUNTER — Encounter (HOSPITAL_COMMUNITY): Payer: Self-pay

## 2015-07-27 ENCOUNTER — Ambulatory Visit: Payer: Medicare Other | Admitting: *Deleted

## 2015-07-27 DIAGNOSIS — Z8673 Personal history of transient ischemic attack (TIA), and cerebral infarction without residual deficits: Secondary | ICD-10-CM | POA: Diagnosis not present

## 2015-07-27 DIAGNOSIS — I2510 Atherosclerotic heart disease of native coronary artery without angina pectoris: Secondary | ICD-10-CM | POA: Diagnosis present

## 2015-07-27 DIAGNOSIS — J0410 Acute tracheitis without obstruction: Secondary | ICD-10-CM | POA: Insufficient documentation

## 2015-07-27 DIAGNOSIS — E039 Hypothyroidism, unspecified: Secondary | ICD-10-CM

## 2015-07-27 DIAGNOSIS — E1122 Type 2 diabetes mellitus with diabetic chronic kidney disease: Secondary | ICD-10-CM | POA: Diagnosis present

## 2015-07-27 DIAGNOSIS — E1129 Type 2 diabetes mellitus with other diabetic kidney complication: Secondary | ICD-10-CM

## 2015-07-27 DIAGNOSIS — J810 Acute pulmonary edema: Secondary | ICD-10-CM

## 2015-07-27 DIAGNOSIS — I255 Ischemic cardiomyopathy: Secondary | ICD-10-CM | POA: Diagnosis present

## 2015-07-27 DIAGNOSIS — Z820 Family history of epilepsy and other diseases of the nervous system: Secondary | ICD-10-CM | POA: Diagnosis not present

## 2015-07-27 DIAGNOSIS — K219 Gastro-esophageal reflux disease without esophagitis: Secondary | ICD-10-CM | POA: Diagnosis present

## 2015-07-27 DIAGNOSIS — Z833 Family history of diabetes mellitus: Secondary | ICD-10-CM | POA: Diagnosis not present

## 2015-07-27 DIAGNOSIS — J209 Acute bronchitis, unspecified: Secondary | ICD-10-CM | POA: Diagnosis present

## 2015-07-27 DIAGNOSIS — Z7982 Long term (current) use of aspirin: Secondary | ICD-10-CM | POA: Diagnosis not present

## 2015-07-27 DIAGNOSIS — E890 Postprocedural hypothyroidism: Secondary | ICD-10-CM | POA: Diagnosis present

## 2015-07-27 DIAGNOSIS — I739 Peripheral vascular disease, unspecified: Secondary | ICD-10-CM | POA: Diagnosis present

## 2015-07-27 DIAGNOSIS — Z87891 Personal history of nicotine dependence: Secondary | ICD-10-CM

## 2015-07-27 DIAGNOSIS — Z79899 Other long term (current) drug therapy: Secondary | ICD-10-CM | POA: Diagnosis not present

## 2015-07-27 DIAGNOSIS — Z9981 Dependence on supplemental oxygen: Secondary | ICD-10-CM | POA: Diagnosis not present

## 2015-07-27 DIAGNOSIS — I5043 Acute on chronic combined systolic (congestive) and diastolic (congestive) heart failure: Principal | ICD-10-CM | POA: Diagnosis present

## 2015-07-27 DIAGNOSIS — J441 Chronic obstructive pulmonary disease with (acute) exacerbation: Secondary | ICD-10-CM | POA: Diagnosis present

## 2015-07-27 DIAGNOSIS — J386 Stenosis of larynx: Secondary | ICD-10-CM | POA: Diagnosis present

## 2015-07-27 DIAGNOSIS — Z930 Tracheostomy status: Secondary | ICD-10-CM | POA: Diagnosis not present

## 2015-07-27 DIAGNOSIS — I440 Atrioventricular block, first degree: Secondary | ICD-10-CM | POA: Diagnosis present

## 2015-07-27 DIAGNOSIS — J9601 Acute respiratory failure with hypoxia: Secondary | ICD-10-CM

## 2015-07-27 DIAGNOSIS — J440 Chronic obstructive pulmonary disease with acute lower respiratory infection: Secondary | ICD-10-CM | POA: Diagnosis present

## 2015-07-27 DIAGNOSIS — I509 Heart failure, unspecified: Secondary | ICD-10-CM

## 2015-07-27 DIAGNOSIS — I5033 Acute on chronic diastolic (congestive) heart failure: Secondary | ICD-10-CM | POA: Diagnosis not present

## 2015-07-27 DIAGNOSIS — Z951 Presence of aortocoronary bypass graft: Secondary | ICD-10-CM

## 2015-07-27 DIAGNOSIS — I5042 Chronic combined systolic (congestive) and diastolic (congestive) heart failure: Secondary | ICD-10-CM

## 2015-07-27 DIAGNOSIS — R7989 Other specified abnormal findings of blood chemistry: Secondary | ICD-10-CM

## 2015-07-27 DIAGNOSIS — I252 Old myocardial infarction: Secondary | ICD-10-CM | POA: Diagnosis not present

## 2015-07-27 DIAGNOSIS — E785 Hyperlipidemia, unspecified: Secondary | ICD-10-CM | POA: Diagnosis present

## 2015-07-27 DIAGNOSIS — R0600 Dyspnea, unspecified: Secondary | ICD-10-CM | POA: Diagnosis not present

## 2015-07-27 DIAGNOSIS — R778 Other specified abnormalities of plasma proteins: Secondary | ICD-10-CM

## 2015-07-27 DIAGNOSIS — Z794 Long term (current) use of insulin: Secondary | ICD-10-CM | POA: Diagnosis not present

## 2015-07-27 DIAGNOSIS — E1165 Type 2 diabetes mellitus with hyperglycemia: Secondary | ICD-10-CM | POA: Diagnosis present

## 2015-07-27 DIAGNOSIS — J9621 Acute and chronic respiratory failure with hypoxia: Secondary | ICD-10-CM | POA: Diagnosis present

## 2015-07-27 DIAGNOSIS — R9431 Abnormal electrocardiogram [ECG] [EKG]: Secondary | ICD-10-CM | POA: Diagnosis not present

## 2015-07-27 DIAGNOSIS — Z8249 Family history of ischemic heart disease and other diseases of the circulatory system: Secondary | ICD-10-CM | POA: Diagnosis not present

## 2015-07-27 DIAGNOSIS — N183 Chronic kidney disease, stage 3 (moderate): Secondary | ICD-10-CM | POA: Diagnosis present

## 2015-07-27 DIAGNOSIS — I10 Essential (primary) hypertension: Secondary | ICD-10-CM | POA: Diagnosis present

## 2015-07-27 DIAGNOSIS — IMO0002 Reserved for concepts with insufficient information to code with codable children: Secondary | ICD-10-CM

## 2015-07-27 LAB — URINALYSIS, ROUTINE W REFLEX MICROSCOPIC
Bilirubin Urine: NEGATIVE
Glucose, UA: 500 mg/dL — AB
Ketones, ur: NEGATIVE mg/dL
NITRITE: NEGATIVE
PROTEIN: NEGATIVE mg/dL
Specific Gravity, Urine: 1.01 (ref 1.005–1.030)
UROBILINOGEN UA: 0.2 mg/dL (ref 0.0–1.0)
pH: 5 (ref 5.0–8.0)

## 2015-07-27 LAB — CBC WITH DIFFERENTIAL/PLATELET
BASOS PCT: 0 %
Basophils Absolute: 0 10*3/uL (ref 0.0–0.1)
EOS PCT: 5 %
Eosinophils Absolute: 0.3 10*3/uL (ref 0.0–0.7)
HCT: 34.2 % — ABNORMAL LOW (ref 36.0–46.0)
Hemoglobin: 10.9 g/dL — ABNORMAL LOW (ref 12.0–15.0)
Lymphocytes Relative: 14 %
Lymphs Abs: 0.9 10*3/uL (ref 0.7–4.0)
MCH: 31 pg (ref 26.0–34.0)
MCHC: 31.9 g/dL (ref 30.0–36.0)
MCV: 97.2 fL (ref 78.0–100.0)
MONO ABS: 0.4 10*3/uL (ref 0.1–1.0)
Monocytes Relative: 6 %
Neutro Abs: 4.5 10*3/uL (ref 1.7–7.7)
Neutrophils Relative %: 75 %
Platelets: 149 10*3/uL — ABNORMAL LOW (ref 150–400)
RBC: 3.52 MIL/uL — ABNORMAL LOW (ref 3.87–5.11)
RDW: 14.1 % (ref 11.5–15.5)
WBC: 6 10*3/uL (ref 4.0–10.5)

## 2015-07-27 LAB — COMPREHENSIVE METABOLIC PANEL
ALK PHOS: 104 U/L (ref 38–126)
ALT: 17 U/L (ref 14–54)
ANION GAP: 7 (ref 5–15)
AST: 19 U/L (ref 15–41)
Albumin: 3.4 g/dL — ABNORMAL LOW (ref 3.5–5.0)
BILIRUBIN TOTAL: 0.5 mg/dL (ref 0.3–1.2)
BUN: 35 mg/dL — AB (ref 6–20)
CALCIUM: 8.6 mg/dL — AB (ref 8.9–10.3)
CO2: 25 mmol/L (ref 22–32)
Chloride: 107 mmol/L (ref 101–111)
Creatinine, Ser: 1.22 mg/dL — ABNORMAL HIGH (ref 0.44–1.00)
GFR calc Af Amer: 51 mL/min — ABNORMAL LOW (ref >60)
GFR calc non Af Amer: 44 mL/min — ABNORMAL LOW (ref >60)
GLUCOSE: 284 mg/dL — AB (ref 65–99)
Potassium: 3.9 mmol/L (ref 3.5–5.1)
Sodium: 139 mmol/L (ref 135–145)
TOTAL PROTEIN: 6.8 g/dL (ref 6.5–8.1)

## 2015-07-27 LAB — URINE MICROSCOPIC-ADD ON

## 2015-07-27 LAB — GLUCOSE, CAPILLARY
GLUCOSE-CAPILLARY: 227 mg/dL — AB (ref 65–99)
Glucose-Capillary: 360 mg/dL — ABNORMAL HIGH (ref 65–99)
Glucose-Capillary: 410 mg/dL — ABNORMAL HIGH (ref 65–99)

## 2015-07-27 LAB — TROPONIN I
TROPONIN I: 0.05 ng/mL — AB (ref <0.031)
Troponin I: 0.06 ng/mL — ABNORMAL HIGH (ref <0.031)
Troponin I: 0.05 ng/mL — ABNORMAL HIGH (ref <0.031)

## 2015-07-27 LAB — BRAIN NATRIURETIC PEPTIDE: B NATRIURETIC PEPTIDE 5: 852 pg/mL — AB (ref 0.0–100.0)

## 2015-07-27 LAB — CREATININE, SERUM
CREATININE: 1.27 mg/dL — AB (ref 0.44–1.00)
GFR calc Af Amer: 49 mL/min — ABNORMAL LOW (ref >60)
GFR, EST NON AFRICAN AMERICAN: 42 mL/min — AB (ref >60)

## 2015-07-27 MED ORDER — ONDANSETRON HCL 4 MG/2ML IJ SOLN: 4.0000 mg | Freq: Four times a day (QID) | INTRAMUSCULAR | Status: DC | PRN

## 2015-07-27 MED ORDER — INSULIN ASPART 100 UNIT/ML ~~LOC~~ SOLN
8.0000 [IU] | Freq: Once | SUBCUTANEOUS | Status: AC
  Administered 2015-07-27: 8 [IU] via SUBCUTANEOUS

## 2015-07-27 MED ORDER — DEXTROSE 5 % IV SOLN
1.0000 g | INTRAVENOUS | Status: DC
  Administered 2015-07-27 – 2015-07-28 (×2): 1 g via INTRAVENOUS
  Filled 2015-07-27 (×3): qty 10

## 2015-07-27 MED ORDER — INSULIN GLARGINE 100 UNIT/ML ~~LOC~~ SOLN
10.0000 [IU] | Freq: Once | SUBCUTANEOUS | Status: AC
  Administered 2015-07-27: 10 [IU] via SUBCUTANEOUS
  Filled 2015-07-27: qty 0.1

## 2015-07-27 MED ORDER — ALBUTEROL SULFATE (2.5 MG/3ML) 0.083% IN NEBU
5.0000 mg | INHALATION_SOLUTION | Freq: Once | RESPIRATORY_TRACT | Status: AC
  Administered 2015-07-27: 5 mg via RESPIRATORY_TRACT
  Filled 2015-07-27: qty 6

## 2015-07-27 MED ORDER — POTASSIUM CHLORIDE CRYS ER 20 MEQ PO TBCR
40.0000 meq | EXTENDED_RELEASE_TABLET | Freq: Every day | ORAL | Status: AC
  Administered 2015-07-27 – 2015-07-29 (×3): 40 meq via ORAL
  Filled 2015-07-27 (×3): qty 2

## 2015-07-27 MED ORDER — DULOXETINE HCL 60 MG PO CPEP
60.0000 mg | ORAL_CAPSULE | Freq: Every day | ORAL | Status: DC
Start: 2015-07-27 — End: 2015-07-30
  Administered 2015-07-27 – 2015-07-30 (×4): 60 mg via ORAL
  Filled 2015-07-27 (×4): qty 1

## 2015-07-27 MED ORDER — FUROSEMIDE 10 MG/ML IJ SOLN
60.0000 mg | Freq: Once | INTRAMUSCULAR | Status: AC
  Administered 2015-07-27: 60 mg via INTRAVENOUS
  Filled 2015-07-27: qty 6

## 2015-07-27 MED ORDER — IPRATROPIUM-ALBUTEROL 0.5-2.5 (3) MG/3ML IN SOLN: 3.0000 mL | Freq: Two times a day (BID) | RESPIRATORY_TRACT | Status: DC | PRN

## 2015-07-27 MED ORDER — INSULIN ASPART 100 UNIT/ML ~~LOC~~ SOLN
0.0000 [IU] | Freq: Three times a day (TID) | SUBCUTANEOUS | Status: DC
  Administered 2015-07-27: 3 [IU] via SUBCUTANEOUS
  Administered 2015-07-27: 9 [IU] via SUBCUTANEOUS

## 2015-07-27 MED ORDER — FUROSEMIDE 10 MG/ML IJ SOLN
60.0000 mg | Freq: Two times a day (BID) | INTRAMUSCULAR | Status: DC
  Administered 2015-07-27 – 2015-07-28 (×3): 60 mg via INTRAVENOUS
  Filled 2015-07-27 (×3): qty 6

## 2015-07-27 MED ORDER — POTASSIUM CHLORIDE CRYS ER 20 MEQ PO TBCR: 40.0000 meq | EXTENDED_RELEASE_TABLET | Freq: Every day | ORAL | Status: DC

## 2015-07-27 MED ORDER — CARVEDILOL 25 MG PO TABS
25.0000 mg | ORAL_TABLET | Freq: Two times a day (BID) | ORAL | Status: DC
  Administered 2015-07-27 – 2015-07-29 (×4): 25 mg via ORAL
  Filled 2015-07-27 (×6): qty 1

## 2015-07-27 MED ORDER — DEXTROSE 5 % IV SOLN
500.0000 mg | INTRAVENOUS | Status: DC
  Administered 2015-07-27 – 2015-07-28 (×2): 500 mg via INTRAVENOUS
  Filled 2015-07-27 (×3): qty 500

## 2015-07-27 MED ORDER — INSULIN GLARGINE 100 UNIT/ML ~~LOC~~ SOLN
20.0000 [IU] | Freq: Every day | SUBCUTANEOUS | Status: DC
  Administered 2015-07-27 – 2015-07-28 (×2): 20 [IU] via SUBCUTANEOUS
  Filled 2015-07-27 (×3): qty 0.2

## 2015-07-27 MED ORDER — ALBUTEROL SULFATE (2.5 MG/3ML) 0.083% IN NEBU
5.0000 mg | INHALATION_SOLUTION | Freq: Once | RESPIRATORY_TRACT | Status: AC
Start: 2015-07-27 — End: 2015-07-27
  Administered 2015-07-27: 5 mg via RESPIRATORY_TRACT
  Filled 2015-07-27: qty 6

## 2015-07-27 MED ORDER — CLOPIDOGREL BISULFATE 75 MG PO TABS
75.0000 mg | ORAL_TABLET | Freq: Every day | ORAL | Status: DC
  Administered 2015-07-27 – 2015-07-30 (×4): 75 mg via ORAL
  Filled 2015-07-27 (×4): qty 1

## 2015-07-27 MED ORDER — IPRATROPIUM-ALBUTEROL 0.5-2.5 (3) MG/3ML IN SOLN
3.0000 mL | Freq: Four times a day (QID) | RESPIRATORY_TRACT | Status: DC
  Administered 2015-07-27 – 2015-07-29 (×8): 3 mL via RESPIRATORY_TRACT
  Filled 2015-07-27 (×7): qty 3

## 2015-07-27 MED ORDER — SIMVASTATIN 40 MG PO TABS
40.0000 mg | ORAL_TABLET | Freq: Every evening | ORAL | Status: DC
  Administered 2015-07-27 – 2015-07-29 (×3): 40 mg via ORAL
  Filled 2015-07-27 (×3): qty 1

## 2015-07-27 MED ORDER — SODIUM CHLORIDE 0.9 % IJ SOLN: 3.0000 mL | INTRAMUSCULAR | Status: DC | PRN

## 2015-07-27 MED ORDER — SODIUM CHLORIDE 0.9 % IJ SOLN
3.0000 mL | Freq: Two times a day (BID) | INTRAMUSCULAR | Status: DC
  Administered 2015-07-27 – 2015-07-30 (×7): 3 mL via INTRAVENOUS

## 2015-07-27 MED ORDER — DICYCLOMINE HCL 20 MG PO TABS
20.0000 mg | ORAL_TABLET | Freq: Three times a day (TID) | ORAL | Status: DC | PRN
  Administered 2015-07-27: 20 mg via ORAL
  Filled 2015-07-27: qty 1

## 2015-07-27 MED ORDER — ONDANSETRON HCL 4 MG PO TABS: 4.0000 mg | ORAL_TABLET | Freq: Four times a day (QID) | ORAL | Status: DC | PRN

## 2015-07-27 MED ORDER — GLUCOSE 4 G PO CHEW
1.0000 | CHEWABLE_TABLET | ORAL | Status: DC | PRN
  Filled 2015-07-27: qty 1

## 2015-07-27 MED ORDER — ASPIRIN EC 81 MG PO TBEC
81.0000 mg | DELAYED_RELEASE_TABLET | Freq: Every day | ORAL | Status: DC
  Administered 2015-07-27 – 2015-07-30 (×4): 81 mg via ORAL
  Filled 2015-07-27 (×6): qty 1

## 2015-07-27 MED ORDER — SODIUM CHLORIDE 0.9 % IJ SOLN
3.0000 mL | Freq: Two times a day (BID) | INTRAMUSCULAR | Status: DC
  Administered 2015-07-28 – 2015-07-29 (×2): 3 mL via INTRAVENOUS

## 2015-07-27 MED ORDER — SENNOSIDES-DOCUSATE SODIUM 8.6-50 MG PO TABS
1.0000 | ORAL_TABLET | Freq: Every evening | ORAL | Status: DC | PRN
Start: 2015-07-27 — End: 2015-07-30
  Filled 2015-07-27: qty 1

## 2015-07-27 MED ORDER — ALUM & MAG HYDROXIDE-SIMETH 200-200-20 MG/5ML PO SUSP: 30.0000 mL | Freq: Four times a day (QID) | ORAL | Status: DC | PRN

## 2015-07-27 MED ORDER — NITROGLYCERIN 0.4 MG SL SUBL
0.4000 mg | SUBLINGUAL_TABLET | SUBLINGUAL | Status: DC | PRN
  Administered 2015-07-29: 0.4 mg via SUBLINGUAL
  Filled 2015-07-27: qty 1

## 2015-07-27 MED ORDER — ALBUTEROL SULFATE (2.5 MG/3ML) 0.083% IN NEBU: 2.5000 mg | INHALATION_SOLUTION | RESPIRATORY_TRACT | Status: AC | PRN

## 2015-07-27 MED ORDER — INSULIN ASPART 100 UNIT/ML ~~LOC~~ SOLN: 0.0000 [IU] | Freq: Three times a day (TID) | SUBCUTANEOUS | Status: DC

## 2015-07-27 MED ORDER — ISOSORBIDE MONONITRATE ER 30 MG PO TB24
15.0000 mg | ORAL_TABLET | Freq: Every day | ORAL | Status: DC
  Administered 2015-07-27 – 2015-07-29 (×3): 15 mg via ORAL
  Filled 2015-07-27 (×4): qty 1

## 2015-07-27 MED ORDER — FERROUS SULFATE 325 (65 FE) MG PO TABS
325.0000 mg | ORAL_TABLET | Freq: Every day | ORAL | Status: DC
  Administered 2015-07-28 – 2015-07-29 (×2): 325 mg via ORAL
  Filled 2015-07-27: qty 1

## 2015-07-27 MED ORDER — METHYLPREDNISOLONE SODIUM SUCC 125 MG IJ SOLR
125.0000 mg | Freq: Once | INTRAMUSCULAR | Status: AC
  Administered 2015-07-27: 125 mg via INTRAVENOUS
  Filled 2015-07-27: qty 2

## 2015-07-27 MED ORDER — INSULIN ASPART 100 UNIT/ML ~~LOC~~ SOLN
3.0000 [IU] | Freq: Three times a day (TID) | SUBCUTANEOUS | Status: DC
  Administered 2015-07-27: 3 [IU] via SUBCUTANEOUS

## 2015-07-27 MED ORDER — IRBESARTAN 300 MG PO TABS
300.0000 mg | ORAL_TABLET | Freq: Every day | ORAL | Status: DC
  Administered 2015-07-27: 300 mg via ORAL
  Filled 2015-07-27: qty 1

## 2015-07-27 MED ORDER — LEVOTHYROXINE SODIUM 50 MCG PO TABS
50.0000 ug | ORAL_TABLET | Freq: Every day | ORAL | Status: DC
  Administered 2015-07-28 – 2015-07-30 (×3): 50 ug via ORAL
  Filled 2015-07-27 (×4): qty 1

## 2015-07-27 MED ORDER — GABAPENTIN 300 MG PO CAPS
300.0000 mg | ORAL_CAPSULE | Freq: Every day | ORAL | Status: DC
  Administered 2015-07-27 – 2015-07-30 (×4): 300 mg via ORAL
  Filled 2015-07-27 (×4): qty 1

## 2015-07-27 MED ORDER — METHYLPREDNISOLONE SODIUM SUCC 125 MG IJ SOLR
60.0000 mg | Freq: Two times a day (BID) | INTRAMUSCULAR | Status: DC
  Administered 2015-07-27 – 2015-07-28 (×3): 60 mg via INTRAVENOUS
  Filled 2015-07-27 (×4): qty 2

## 2015-07-27 MED ORDER — PANTOPRAZOLE SODIUM 40 MG PO TBEC
40.0000 mg | DELAYED_RELEASE_TABLET | Freq: Two times a day (BID) | ORAL | Status: DC
  Administered 2015-07-27 – 2015-07-30 (×7): 40 mg via ORAL
  Filled 2015-07-27 (×7): qty 1

## 2015-07-27 MED ORDER — HYDRALAZINE HCL 20 MG/ML IJ SOLN
10.0000 mg | Freq: Four times a day (QID) | INTRAMUSCULAR | Status: DC | PRN
  Filled 2015-07-27: qty 1

## 2015-07-27 MED ORDER — SODIUM CHLORIDE 0.9 % IV SOLN: 250.0000 mL | INTRAVENOUS | Status: DC | PRN

## 2015-07-27 NOTE — Evaluation (Signed)
Occupational Therapy Evaluation Patient Details Name: [name] MRN: [id number] DOB: 12/24/1945 Today's Date: 07/27/2015    History of Present Illness [name] is a 69 y.o. female with past medical history of CAD, Type II DM, PAD, Stroke, Chronic combined systolic and diastolic CHF, HLD, HTN, chronic tracheostomy, COPD (4L home O2 at night, PRN during day) and Stage 3 CKD who presented to [facility] ED on 07/27/2015 for worsening shortness of breath starting on 07/25/2015.   Clinical Impression   Patient presenting with deconditioning secondary to above. Patient mod I PTA. Patient currently functioning at an overall supervision>min assist level. Patient will benefit from acute OT to increase overall independence in the areas of ADLs, functional mobility, education on energy conservation, and overall safety in order to safely discharge home with intermittent supervision/assistance from daughter.   Pt on RA during most of activity during this OT eval/treat, sats=88% towards end of session and decreased -> 82%. Encouraged pursed lip breathing and re-donned supplemental 02 and sats quickly increased to 96%.     Follow Up Recommendations  No OT follow up;Supervision - Intermittent    Equipment Recommendations  None recommended by OT    Recommendations for Other Services  None at this time    Precautions / Restrictions Precautions Precautions: Fall Restrictions Weight Bearing Restrictions: No    Mobility Bed Mobility Overal bed mobility: Needs Assistance Bed Mobility: Supine to Sit;Sit to Supine     Supine to sit: Supervision Sit to supine: Supervision   General bed mobility comments: Supervision for safety, cues for breathing technique due to low 02 sats while on RA  Transfers Overall transfer level: Needs assistance Equipment used: Rolling walker (2 wheeled) Transfers: Sit to/from Stand Sit to Stand: Supervision General transfer comment: Supervision for safety.  Cues for hand placement and overall safety with RW.     Balance Overall balance assessment: Needs assistance Sitting-balance support: No upper extremity supported;Feet supported Sitting balance-Leahy Scale: Good     Standing balance support: Bilateral upper extremity supported;During functional activity Standing balance-Leahy Scale: Fair    ADL Overall ADL's : Needs assistance/impaired General ADL Comments: Pt overall supervision>min assist with ADLs and functional mobility. PTA, pt was mod I using cane vs RW. Pt's daughter lives with her and states she works during the day. Pt was cooking and daughter was cleaning PTA. Pt would transfer in/out of shower on her own using shower seat.     Pertinent Vitals/Pain Pain Assessment: Faces Faces Pain Scale: Hurts even more Pain Location: left knee during mobility Pain Descriptors / Indicators: Grimacing;Dull;Discomfort Pain Intervention(s): Monitored during session     Hand Dominance Right   Extremity/Trunk Assessment Upper Extremity Assessment Upper Extremity Assessment: Overall WFL for tasks assessed   Lower Extremity Assessment Lower Extremity Assessment: Defer to PT evaluation   Cervical / Trunk Assessment Cervical / Trunk Assessment: Normal   Communication Communication Communication: No difficulties   Cognition Arousal/Alertness: Awake/alert Behavior During Therapy: WFL for tasks assessed/performed Overall Cognitive Status: Within Functional Limits for tasks assessed              Home Living Family/patient expects to be discharged to:: Private residence Living Arrangements: Children Available Help at Discharge: Family;Available PRN/intermittently (daughter works during day) Bathroom Shower/Tub: IT trainer: Standard     Home Equipment: Environmental consultant - 2 wheels;Cane - single point;Bedside commode;Shower seat;Hand held shower head    Prior Functioning/Environment Level of Independence:  Independent with assistive device(s)  Comments: used cane  vs RW, normally RW for community. pt reports she would cook and daughter would clean PTA.     OT Diagnosis: Generalized weakness   OT Problem List: Decreased strength;Decreased activity tolerance;Decreased safety awareness;Impaired balance (sitting and/or standing);Cardiopulmonary status limiting activity   OT Treatment/Interventions: Self-care/ADL training;Therapeutic exercise;Energy conservation;DME and/or AE instruction;Patient/family education;Therapeutic activities;Balance training    OT Goals(Current goals can be found in the care plan section) Acute Rehab OT Goals Patient Stated Goal: go home with daughter  OT Goal Formulation: With patient/family Time For Goal Achievement: 08/10/15 Potential to Achieve Goals: Good ADL Goals Pt Will Perform Grooming: standing;with modified independence Pt Will Perform Lower Body Bathing: with modified independence;sit to/from stand Pt Will Perform Lower Body Dressing: with modified independence;sit to/from stand Pt Will Transfer to Toilet: with modified independence;ambulating;regular height toilet Pt Will Perform Tub/Shower Transfer: Tub transfer;ambulating;rolling walker Additional ADL Goal #1: Pt will be educated on energy conservation techniques to increase independence and safety with ADLs and functional mobility   OT Frequency: Min 2X/week   Barriers to D/C: None known at this time   End of Session Equipment Utilized During Treatment: Rolling walker;Oxygen  Activity Tolerance: Patient tolerated treatment well Patient left: in bed;with call bell/phone within reach;with family/visitor present   Time: 1610-9604 OT Time Calculation (min): 25 min Charges:  OT General Charges $OT Visit: 1 Procedure OT Evaluation $Initial OT Evaluation Tier I: 1 Procedure OT Treatments $Self Care/Home Management : 8-22 mins  Devontaye Ground , MS, OTR/L, CLT Pager: [phone]  07/27/2015, 1:39  PM

## 2015-07-27 NOTE — Progress Notes (Signed)
Utilization review completed. Eletha Culbertson, RN, BSN. 

## 2015-07-27 NOTE — Progress Notes (Signed)
Pt. CBG 410. Text page sent to on call NP Schorr. Pt. Alert and stable. No s/s of distress or discomfort noted. RN will continue to monitor pt. For changes in condition. Odyssey Vasbinder, Cheryll Dessert

## 2015-07-27 NOTE — Consult Note (Signed)
CARDIOLOGY CONSULT NOTE   Patient ID: [name] MRN: [id number], DOB/AGE: [birth year]   Admit date: 07/27/2015 Date of Consult: 07/27/2015 Reason for  Consult: CHF, EKG Changes   Primary Physician: Cala Bradford, MD Primary Cardiologist: Dr. Anne Fu  HPI: [name] is a 69 y.o. female with past medical history of CAD (s/p CABG with most recent cath in 12/2014 showing LM patent, LAD 100% occluded, RCA 100% occluded, SVG-RCA CTO, SVG-mid LAD, distal LAD 70% stenosis), Type II DM, PAD, Stroke, Chronic combined systolic and diastolic CHF, HLD, HTN, chronic tracheostomy,  COPD (4L home O2 at night, PRN during day) and Stage 3 CKD who presented to [facility] ED on 07/27/2015 for worsening shortness of breath starting on 07/25/2015.  The patient reports her breathing started to get worse over the weekend and she was having shortness of breath at rest. Unsure if her breathing has been worse with exertion, for she reports being pretty sedentary at baseline due to knee and back pain. She ambulates with a cane at home, but for not great distances.  Reports having gained 12 pounds over past month. Her baseline weight at home is 160lbs and has been 171lbs recently. She was seen in the office by Tereso Newcomer, PA-C last week and her Lasix dosage was increased from  BID to  in the AM and  in the PM. She has not noticed an improvement in her symptoms since the dose change last week. Reports her abdomen feels greatly enlarged. Denies noticing any edema. She reports sleeping with 3 pillows at night, which she reports is consistent for her.  Echocardiogram in 12/2014 showed Hypokinesis along the inferolateral wall and base inferior wall of the left ventricle. Moderate LVH noted with EF of 50%. Aortic valve sclerosis without stenosis was present.   She underwent left and right heart catheterization in 12/2014 which showed cardiac index 2.91, cardiac output 5.1, wedge pressure 26, PA pressure  61/20 with mean of 40. Left heart cath showed severe native and graft CAD with 100% occluded RCA, 100% occluded ostial LAD, 100% occluded small AV groove circumflex along with chronically occluded SVG to RCA, patent SVG to distal portion of mid LAD, medical therapy was recommended at that time.   Problem List  Past Medical History  Diagnosis Date  . Diabetes mellitus     on Lantus 30 U   . CAD (coronary artery disease)     a. S/p CABG and stenting b. L&R cath 01/09/15 LM patent, LAD 100% occluded, LCX moderate disease, RCA 100% occluded. SVG-RCA CTO, SVG-mid LAD patent with more distal LAD 70% stenosis. Recs for medical management.   Marland Kitchen PAD (peripheral artery disease) (HCC)   . Stroke [facility])     MRI 11/2011 with remote occipital lobe. MRA with moderate left focal vertebral artery stenosis  . Chronic combined systolic and diastolic CHF (congestive heart failure) (HCC)     a. 11/2011 Echo with EF 30-35%, global hypokinesis, and inferior akinesis;  b. 12/2014 Echo: EF 50%, mod LVH, Ao sclerosis w/o stenosis, mildly dil LA, mild to mod RV dysfxn.  . Hyperlipidemia   . DDD (degenerative disc disease), lumbar   . MI (myocardial infarction) (HCC) 1997  . COPD (chronic obstructive pulmonary disease) (HCC)     a. prn and HS supplemental O2.  . Anemia   . Hypertension   . GERD (gastroesophageal reflux disease)   . History of IBS   . Hypothyroidism     Goiter  . Carotid  artery occlusion   . CKD (chronic kidney disease), stage III     stage 3  . History of tracheostomy (HCC) 08/26/2013    Dr. Jenne Pane    Past Surgical History  Procedure Laterality Date  . Ptca    . Thyroidectomy    . Coronary artery bypass graft      2 vessel  . Carotid endarterectomy  ~2008    Left   . Cholecystectomy    . Tracheostomy tube placement  01/02/2012  . Angioplasty  [id number]    Aortogram by Dr. Italy McKenzie [facility])  . Pr vein bypass graft,aorto-fem-pop      Right common femoral-AK popliteal BPG & Right  Popliteal-posterior tibial  . Pr vein bypass graft,aorto-fem-pop      Left Fem-pop BPG  . Carpal tunnel release Right   . Femoral-tibial bypass graft Left 06/11/2013    Procedure: BYPASS GRAFT  LEFT FEMORAL- POSTERIOR TIBIAL ARTERY/ REDO;  Surgeon: Sherren Kerns, MD;  Location: [facility] OR;  Service: Vascular;  Laterality: Left;  . Thrombectomy femoral artery Left 06/11/2013    Procedure: THROMBECTOMY FEMORAL ARTERY;  Surgeon: Sherren Kerns, MD;  Location: [facility] OR;  Service: Vascular;  Laterality: Left;  . Spine surgery  Oct. 27, 2014    Injection - Back  . Abdominal aortagram N/A 04/05/2013    Procedure: ABDOMINAL Ronny Flurry;  Surgeon: Sherren Kerns, MD;  Location: [facility] CATH LAB;  Service: Cardiovascular;  Laterality: N/A;  . Colonoscopy N/A 10/27/2014    Procedure: COLONOSCOPY;  Surgeon: Willis Modena, MD;  Location: [facility] ENDOSCOPY;  Service: Endoscopy;  Laterality: N/A;  . Left and right heart catheterization with coronary angiogram N/A 01/09/2015    Procedure: LEFT AND RIGHT HEART CATHETERIZATION WITH CORONARY ANGIOGRAM;  Surgeon: Marykay Lex, MD;  Location: [facility] CATH LAB;  Service: Cardiovascular;  Laterality: N/A;     Allergies  Allergies  Allergen Reactions  . Aldactone [Spironolactone] Other (See Comments)    Severe hyperkalemia   . Lisinopril Other (See Comments) and Cough    Hypotension also  . Crestor [Rosuvastatin Calcium] Other (See Comments)    Muscle Pain  . Vicodin [Hydrocodone-Acetaminophen] Nausea And Vomiting      Inpatient Medications  . aspirin EC  81 mg Oral Daily  . azithromycin  500 mg Intravenous Q24H  . carvedilol  25 mg Oral BID WC  . cefTRIAXone (ROCEPHIN)  IV  1 g Intravenous Q24H  . clopidogrel  75 mg Oral Daily  . DULoxetine  60 mg Oral Daily  . [START ON 07/28/2015] ferrous sulfate  325 mg Oral Q breakfast  . furosemide  60 mg Intravenous BID  . gabapentin  300 mg Oral Daily  . insulin aspart  0-9 Units Subcutaneous TID WC  . insulin aspart  3  Units Subcutaneous TID WC  . insulin glargine  10 Units Subcutaneous Once  . insulin glargine  20 Units Subcutaneous QHS  . ipratropium-albuterol  3 mL Nebulization Q6H  . irbesartan  300 mg Oral QHS  . isosorbide mononitrate  15 mg Oral Daily  . [START ON 07/28/2015] levothyroxine  50 mcg Oral QAC breakfast  . methylPREDNISolone (SOLU-MEDROL) injection  60 mg Intravenous Q12H  . pantoprazole  40 mg Oral BID  . simvastatin  40 mg Oral QPM  . sodium chloride  3 mL Intravenous Q12H  . sodium chloride  3 mL Intravenous Q12H    Family History Family History  Problem Relation Age of Onset  . Hyperlipidemia Mother   .  Other Mother     AAA  . Alzheimer's disease Mother   . Heart disease Mother   . Irregular heart beat Mother   . Diabetes Daughter   . Hypertension Daughter      Social History Social History   Social History  . Marital Status: Single    Spouse Name: N/A  . Number of Children: 3  . Years of Education: N/A   Occupational History  . Retired    Social History Main Topics  . Smoking status: Former Smoker -- 1.00 packs/day for 50 years    Types: Cigarettes    Quit date: 01/24/2011  . Smokeless tobacco: Never Used  . Alcohol Use: No  . Drug Use: No  . Sexual Activity: No   Other Topics Concern  . Not on file   Social History Narrative   Retired Advertising account executive.  Worked at the [employer] on [employer].  Formerly lived in [place] area.  2 daughters, Living here with her daughter in [place] starting end of January 2013. [name] [phone] [id number]. Ambulatory previously with a cane, but none now.     Review of Systems General:  No chills, fever, night sweats or weight changes.  Cardiovascular:  No chest pain, dyspnea on exertion, edema, palpitations, or paroxysmal nocturnal dyspnea. Dermatological: No rash, lesions/masses Respiratory: No cough. Positive for dyspnea Urologic: No hematuria, dysuria Abdominal:   No nausea, vomiting, diarrhea, bright red  blood per rectum, melena, or hematemesis. Reports increased abdominal girth. Neurologic:  No visual changes, wkns, changes in mental status. All other systems reviewed and are otherwise negative except as noted above.  Physical Exam Blood pressure 131/54, pulse 70, temperature 97.7 F (36.5 C), temperature source Oral, resp. rate 34, SpO2 93 %.  General: Pleasant, African American female in NAD. Tracheostomy in place. Psych: Normal affect. Neuro: Alert and oriented X 3. Moves all extremities spontaneously. HEENT: Normal  Neck: Supple without bruits. JVD mildly elevated. Lungs:  Resp regular and unlabored, Rales at bases bilaterally. Heart: RRR no s3, s4, or murmurs. Abdomen: Soft, non-tender, non-distended, BS + x 4.  Extremities: No clubbing, cyanosis or edema. DP/PT/Radials 2+ and equal bilaterally.  Labs   Recent Labs  07/27/15 0744  TROPONINI 0.06*   Lab Results  Component Value Date   WBC 6.0 07/27/2015   HGB 10.9* 07/27/2015   HCT 34.2* 07/27/2015   MCV 97.2 07/27/2015   PLT 149* 07/27/2015    Recent Labs Lab 07/27/15 0744  NA 139  K 3.9  CL 107  CO2 25  BUN 35*  CREATININE 1.22*  CALCIUM 8.6*  PROT 6.8  BILITOT 0.5  ALKPHOS 104  ALT 17  AST 19  GLUCOSE 284*   Lab Results  Component Value Date   CHOL 134 01/07/2015   HDL 36* 01/07/2015   LDLCALC 74 01/07/2015   TRIG 122 01/07/2015   Radiology/Studies Dg Chest 2 View: 07/27/2015   CLINICAL DATA:  Shortness of breath.  Ex-smoker.  EXAM: CHEST  2 VIEW  COMPARISON:  01/06/2015.  FINDINGS: Stable enlarged cardiac silhouette and diffusely prominent interstitial markings mild diaphragmatic flattening on the lateral view. No pleural fluid seen. Stable post CABG changes. The tracheostomy tube is in satisfactory position. Left neck surgical clips are unchanged.  IMPRESSION: 1. Stable marked cardiomegaly and chronic interstitial lung disease with possible superimposed interstitial edema. 2. No acute abnormality.    Electronically Signed   By: Beckie Salts M.D.   On: 07/27/2015 08:37    ECG:  Sinus rhythm with Multiform PVC's. Sinus beats show no acute changes from previous tracings.  Echocardiogram: 12/2014 Study Conclusions - Left ventricle: Hypokinesis inferolateral wall and base inferior wall. The cavity size was normal. Wall thickness was increased in a pattern of moderate LVH. The estimated ejection fraction was 50%. Doppler parameters are consistent with high ventricular filling pressure. - Aortic valve: Sclerosis without stenosis. There was trivial regurgitation. - Left atrium: The atrium was mildly dilated. - Right ventricle: The cavity size was mildly dilated. Systolic function was mildly to moderately reduced. - Right atrium: The atrium was mildly dilated. - Pulmonary arteries: PA peak pressure: 35 mm Hg (S).  ASSESSMENT AND PLAN  1. Acute on Chronic Combined Systolic and Diastolic CHF - EF was 30-35% in 11/2011 Echo with EF 30-35%. Repeat in 12/2014 showed EF of  50%, mod LVH, Aortic Valve  sclerosis w/o stenosis, mildly dilated LA, and mild to mod RV dysfxn. - new Echocardiogram is being obtained. - BNP elevated to 852 on admission.  - continue with strict I&O's and obtain daily weights. Baseline weight is ~ 160lbs on the patient's home scales (171lbs on home scales prior to admission). - Continue with Lasix  IV BID.  Monitor renal function closely.   2. EKG Changes - initial troponin is 0.06. Cyclic enzymes are being obtained. - EKG shows multiform PVC's with "T-wave concerns" being part of the PVC. No acute changes from previous tracing when looking at sinus beats. - continue ASA, BB, Plavix, ARB, Imdur, and statin. - remains without any chest pain at this time.  3. COPD Exacerbation - on Azithromycin and Rocephin - per IM  4. Stage 3 CKD - creatinine 1.22 on admission, which is close to baseline for the patient.  Otherwise, per admitting  team    Signed, Ellsworth Lennox, PA-C 07/27/2015, 11:34 AM Pager: [phone] As above, patient seen and examined. Briefly she is a 69 year old female with past medical history of coronary artery disease status post coronary artery bypass graft, ischemic cardiomyopathy, diabetes mellitus, hypertension, hyperlipidemia, COPD, prior tracheostomy for subglottic stenosis and renal insufficiency who we are asked to evaluate for acute on chronic combined systolic/diastolic congestive heart failure. The patient did have a cardiac catheterization in March 2016 and medical therapy recommended. The patient describes progressive dyspnea on exertion. Her Lasix was recently increased. However her dyspnea worsened to the point that she was also short of breath at rest. There is orthopnea and weight gain. She has occasional pain in her chest with certain movements but otherwise no symptoms. She has had yellow blood-streaked sputum from her tracheostomy. Fevers or chills. Electrocardiogram shows sinus rhythm, PVCs, first-degree AV block, left atrial enlargement, lateral T-wave inversion. BNP 852. Troponin 0.06. BUN 35 and creatinine 1.22. Patient mildly volume overloaded. Will treat with Lasix 60 mg IV twice a day and follow renal function. Patient instructed on low sodium diet. Plan repeat echocardiogram. I agree with antibiotics for possible bronchitis as well. Troponin of 0.06 is not diagnostic of an acute coronary syndrome. Await follow-up enzymes. Olga Millers

## 2015-07-27 NOTE — ED Provider Notes (Signed)
CSN: [id number]     Arrival date & time 07/27/15  4098 History   First MD Initiated Contact with Patient 07/27/15 [phone]     Chief Complaint  Patient presents with  . Shortness of Breath     (Consider location/radiation/quality/duration/timing/severity/associated sxs/prior Treatment) HPI  Patient presents with concern of chest tightness, dyspnea. Symptoms began about 12 hours ago, while the patient was at rest. Since onset symptoms of been persistent, with no relief in spite of using home albuterol therapy. No true chest pain, no tenderness, no syncope, no nausea, no abdominal pain. Patient was in her usual state of health prior to the onset of symptoms. Patient has multiple medical problems, states that she take all medication as directed. 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   Past Medical History  Diagnosis Date  . Diabetes mellitus     on Lantus 30 U   . CAD (coronary artery disease)     a. S/p CABG and stenting b. L&R cath 01/09/15 LM patent, LAD 100% occluded, LCX moderate disease, RCA 100% occluded. SVG-RCA CTO, SVG-mid LAD patent with more distal LAD 70% stenosis. Recs for medical management.   Marland Kitchen PAD (peripheral artery disease) (HCC)   . Stroke [facility])     MRI 11/2011 with remote occipital lobe. MRA with moderate left focal vertebral artery stenosis  . Chronic combined systolic and diastolic CHF (congestive heart failure) (HCC)     a. 11/2011 Echo with EF 30-35%, global hypokinesis, and inferior akinesis;  b. 12/2014 Echo: EF 50%, mod LVH, Ao sclerosis w/o stenosis, mildly dil LA, mild to mod RV dysfxn.  . Hyperlipidemia   . DDD (degenerative disc disease), lumbar   . MI (myocardial infarction) (HCC) 1997  . COPD (chronic obstructive pulmonary disease) (HCC)     a. prn and HS supplemental O2.  . Anemia   . Hypertension   . GERD (gastroesophageal reflux disease)   . History of IBS   . Hypothyroidism     Goiter  . Carotid artery occlusion    . CKD (chronic kidney disease), stage III     stage 3  . History of tracheostomy (HCC) 08/26/2013    Dr. Jenne Pane   Past Surgical History  Procedure Laterality Date  . Ptca    . Thyroidectomy    . Coronary artery bypass graft      2 vessel  . Carotid endarterectomy  ~2008    Left   . Cholecystectomy    . Tracheostomy tube placement  01/02/2012  . Angioplasty  [id number]    Aortogram by Dr. [place] [facility])  . Pr vein bypass graft,aorto-fem-pop      Right common femoral-AK popliteal BPG & Right Popliteal-posterior tibial  . Pr vein bypass graft,aorto-fem-pop      Left Fem-pop BPG  . Carpal tunnel release Right   . Femoral-tibial bypass graft Left 06/11/2013    Procedure: BYPASS GRAFT  LEFT FEMORAL- POSTERIOR TIBIAL ARTERY/ REDO;  Surgeon: Sherren Kerns, MD;  Location: [facility] OR;  Service: Vascular;  Laterality: Left;  . Thrombectomy femoral artery Left 06/11/2013    Procedure: THROMBECTOMY FEMORAL ARTERY;  Surgeon: Sherren Kerns, MD;  Location: [facility] OR;  Service: Vascular;  Laterality: Left;  . Spine surgery  Oct. 27, 2014    Injection - Back  . Abdominal aortagram N/A 04/05/2013    Procedure: ABDOMINAL Ronny Flurry;  Surgeon: Sherren Kerns, MD;  Location: [facility] CATH LAB;  Service: Cardiovascular;  Laterality: N/A;  . Colonoscopy  N/A 10/27/2014    Procedure: COLONOSCOPY;  Surgeon: Willis Modena, MD;  Location: [facility] ENDOSCOPY;  Service: Endoscopy;  Laterality: N/A;  . Left and right heart catheterization with coronary angiogram N/A 01/09/2015    Procedure: LEFT AND RIGHT HEART CATHETERIZATION WITH CORONARY ANGIOGRAM;  Surgeon: Marykay Lex, MD;  Location: [facility] CATH LAB;  Service: Cardiovascular;  Laterality: N/A;   Family History  Problem Relation Age of Onset  . Hyperlipidemia Mother   . Other Mother     AAA  . Alzheimer's disease Mother   . Heart disease Mother   . Irregular heart beat Mother   . Diabetes Daughter   . Hypertension Daughter    Social History  Substance  Use Topics  . Smoking status: Former Smoker -- 1.00 packs/day for 50 years    Types: Cigarettes    Quit date: 01/24/2011  . Smokeless tobacco: Never Used  . Alcohol Use: No   OB History    No data available     Review of Systems  Constitutional:       Per HPI, otherwise negative  HENT:       Per HPI, otherwise negative  Respiratory:       Per HPI, otherwise negative  Cardiovascular:       Per HPI, otherwise negative  Gastrointestinal: Negative for vomiting.  Endocrine:       Negative aside from HPI  Genitourinary:       Neg aside from HPI   Musculoskeletal:       Per HPI, otherwise negative  Skin: Negative.   Neurological: Negative for syncope.      Allergies  Aldactone; Lisinopril; Crestor; and Vicodin  Home Medications   Prior to Admission medications   Medication Sig Start Date End Date Taking? Authorizing Provider  aspirin EC 81 MG tablet Take 81 mg by mouth daily.    Historical Provider, MD  carvedilol (COREG) 25 MG tablet Take 25 mg by mouth 2 (two) times daily with a meal. 11/28/11   Danley Danker, MD  clopidogrel (PLAVIX) 75 MG tablet Take 75 mg by mouth daily. 11/28/11   Danley Danker, MD  dicyclomine (BENTYL) 20 MG tablet Take 20 mg by mouth 3 (three) times daily before meals.    Historical Provider, MD  DULoxetine (CYMBALTA) 60 MG capsule Take 60 mg by mouth daily. 08/06/14   Historical Provider, MD  ferrous sulfate 325 (65 FE) MG tablet Take 1 tablet (325 mg total) by mouth 2 (two) times daily with a meal. 10/29/14   Starleen Arms, MD  furosemide (LASIX) 40 MG tablet Take 1 tablet (40 mg total) by mouth 2 (two) times daily. 08/11/13   Blane Ohara, MD  gabapentin (NEURONTIN) 300 MG capsule TAKE 1 CAPSULE (300 MG TOTAL) BY MOUTH 2 (TWO) TIMES DAILY. 02/11/15   Lenn Sink, DPM  gabapentin (NEURONTIN) 300 MG capsule TAKE 1 CAPSULE (300 MG TOTAL) BY MOUTH 2 (TWO) TIMES DAILY. 02/13/15   Lenn Sink, DPM  glucose ([facility] GLUCOSE) 4 GM chewable  tablet Chew 1 tablet by mouth as needed for low blood sugar.    Historical Provider, MD  HYDROcodone-acetaminophen (NORCO/VICODIN) 5-325 MG per tablet Take 1 tablet by mouth every 6 (six) hours as needed for moderate pain.    Historical Provider, MD  insulin aspart (NOVOLOG FLEXPEN) 100 UNIT/ML SOPN FlexPen Inject 1-12 Units into the skin 3 (three) times daily with meals. As needed per sliding scale    Historical Provider, MD  Insulin Glargine (TOUJEO SOLOSTAR) 300 UNIT/ML SOPN Inject 16 Units into the skin at bedtime. 10/29/14   Leana Roe Elgergawy, MD  ipratropium-albuterol (DUONEB) 0.5-2.5 (3) MG/3ML SOLN Take 3 mLs by nebulization 2 (two) times daily as needed (for shortnes of breath).     Historical Provider, MD  irbesartan (AVAPRO) 300 MG tablet Take 1 tablet (300 mg total) by mouth at bedtime. 01/05/13   Russella Dar, NP  isosorbide mononitrate (IMDUR) 30 MG 24 hr tablet Take 1 tablet (30 mg total) by mouth daily. 02/04/15   Ok Anis, NP  lansoprazole (PREVACID) 15 MG capsule Take 15 mg by mouth daily at 12 noon.    Historical Provider, MD  levothyroxine (SYNTHROID, LEVOTHROID) 50 MCG tablet  02/06/15   Historical Provider, MD  Multiple Vitamins-Minerals (WOMENS 50+ MULTI VITAMIN/MIN) TABS Take 1 tablet by mouth daily.    Historical Provider, MD  nitroGLYCERIN (NITROSTAT) 0.4 MG SL tablet Place 0.4 mg under the tongue every 5 (five) minutes as needed. For chest pain    Historical Provider, MD  OXYGEN-HELIUM IN Inhale 4 L into the lungs at bedtime.     Historical Provider, MD  Pancrelipase, Lip-Prot-Amyl, (ZENPEP) 20000 UNITS CPEP Take 1 capsule by mouth every morning.    Historical Provider, MD  simvastatin (ZOCOR) 40 MG tablet Take 40 mg by mouth every evening.  11/28/11   Danley Danker, MD  sulfamethoxazole-trimethoprim (BACTRIM DS,SEPTRA DS) 800-160 MG per tablet Take 1 tablet by mouth 2 (two) times daily.    Historical Provider, MD   BP 185/82 mmHg  Pulse 66  Temp(Src) 97.7  F (36.5 C) (Oral)  Resp 25  SpO2 100% Physical Exam  Constitutional: She is oriented to person, place, and time. She appears well-developed and well-nourished. No distress.  HENT:  Head: Normocephalic and atraumatic.  Eyes: Conjunctivae and EOM are normal.  Neck: No tracheal deviation present.    Cardiovascular: Normal rate and regular rhythm.   Pulmonary/Chest: Accessory muscle usage present. No stridor. Tachypnea noted. She has wheezes.  Abdominal: She exhibits no distension.  Musculoskeletal: She exhibits no edema.  Neurological: She is alert and oriented to person, place, and time. No cranial nerve deficit.  Skin: Skin is warm and dry.  Psychiatric: She has a normal mood and affect.  Nursing note and vitals reviewed.   ED Course  Procedures (including critical care time) Labs Review Labs Reviewed  COMPREHENSIVE METABOLIC PANEL - Abnormal; Notable for the following:    Glucose, Bld 284 (*)    BUN 35 (*)    Creatinine, Ser 1.22 (*)    Calcium 8.6 (*)    Albumin 3.4 (*)    GFR calc non Af Amer 44 (*)    GFR calc Af Amer 51 (*)    All other components within normal limits  CBC WITH DIFFERENTIAL/PLATELET - Abnormal; Notable for the following:    RBC 3.52 (*)    Hemoglobin 10.9 (*)    HCT 34.2 (*)    Platelets 149 (*)    All other components within normal limits  TROPONIN I - Abnormal; Notable for the following:    Troponin I 0.06 (*)    All other components within normal limits  BRAIN NATRIURETIC PEPTIDE - Abnormal; Notable for the following:    B Natriuretic Peptide 852.0 (*)    All other components within normal limits    Imaging Review Dg Chest 2 View  07/27/2015   CLINICAL DATA:  Shortness of breath.  Ex-smoker.  EXAM: CHEST  2 VIEW  COMPARISON:  01/06/2015.  FINDINGS: Stable enlarged cardiac silhouette and diffusely prominent interstitial markings mild diaphragmatic flattening on the lateral view. No pleural fluid seen. Stable post CABG changes. The  tracheostomy tube is in satisfactory position. Left neck surgical clips are unchanged.  IMPRESSION: 1. Stable marked cardiomegaly and chronic interstitial lung disease with possible superimposed interstitial edema. 2. No acute abnormality.   Electronically Signed   By: Beckie Salts M.D.   On: 07/27/2015 08:37   I have personally reviewed and evaluated these images and lab results as part of my medical decision-making.    EMS strip: 69 sr, ivcd, pvc, abn    EKG Interpretation   Date/Time:  Monday July 27 2015 07:05:58 EDT Ventricular Rate:  73 PR Interval:  247 QRS Duration: 105 QT Interval:  497 QTC Calculation: 548 R Axis:   24 Text Interpretation:  Sinus rhythm Multiform ventricular premature  complexes Prolonged PR interval Probable left atrial enlargement  Nonspecific T abnormalities, lateral leads Borderline prolonged QT  interval Baseline wander in lead(s) II III aVR aVL aVF Sinus rhythm  Non-specific intra-ventricular conduction delay Premature ventricular  complexes with 1st degree A-V block QT prolonged Abnormal ekg Confirmed by  Gerhard Munch  MD ([id number]) on 07/27/2015 7:27:55 AM     Following initial nebulizer therapy, the patient is somewhat better. Blood pressure has increased somewhat, but she has no new complaints, no chest pain.   With elevated BNP, consistently elevated troponin, COPD, CHF, patient we admitted for further evaluation and management. MDM   Final diagnoses:  Dyspnea  Elevated brain natriuretic peptide (BNP) level   Patient with tracheostomy, CHF, COPD presents with dyspnea. Here the patient's tachypneic, wheezing, but improves with bronchodilator, steroids in the emergency department. No evidence for pneumonia, bacteremia, sepsis. Patient has minimally elevated troponin, though this is consistent for her.  She is medically managed w Plavix EKG is nonischemic. Patient admitted for further evaluation and management.  The  Gerhard Munch,  MD 07/27/15 [phone]

## 2015-07-27 NOTE — H&P (Signed)
[facility] History and Physical                                                                                    [name], is a 69 y.o. female  MRN: [id number]   DOB - 1945-11-21  Admit Date - 07/27/2015  Outpatient Primary MD for the patient is Cala Bradford, MD Cardiologist:  Dr. Donato Schultz  Referring Physician:  Dr. Jeraldine Loots  Chief Complaint:   Chief Complaint  Patient presents with  . Shortness of Breath     HPI  [name]  is a 69 y.o. 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 She presents to the emergency department today with dyspnea and acute respiratory failure. She reports that her shortness of breath started yesterday. She normally wears 4 L of oxygen at night, but yesterday she wore oxygen all day long and was unable to be without it. She has noticed thick yellow secretions that are streaked with brown from her tracheostomy. She denies any chest pain. She reports palpitations when she coughs. She complains of increased abdominal girth and has gained approximately 12 pounds over the past month. She saw Tereso Newcomer, Cardiology PA on 9/26 and her Lasix dosage was increased to 80 mg in the a.m. and 40 mg in the PM. Her daughter complains that her mother has been more sedentary recently. The patient denies fever, significantly increased cough, abdominal pain, anorexia, dysuria, changes in her bowel habits, myalgias.  In the emergency department her EKG is noted to be dramatically different with changes in V1 through V6. Troponin is mildly elevated at 0.06.  Chest x-ray shows chronic interstitial lung disease with superimposed edema.  Creatinine is at baseline with a GFR of 51%. BNP is 852. White count is within normal limits.  Review of Systems   In addition to the HPI above,  No  Fever-chills, No Headache, No changes with Vision or hearing, No problems swallowing food or Liquids, No Chest pain,  No Abdominal pain, No Nausea or Vomiting, Bowel movements are regular, No Blood in stool or Urine, No dysuria, No new skin rashes or bruises, No new joints pains-aches,  No new weakness, tingling, numbness in any extremity,  A full 10 point Review of Systems was done, except as stated above, all other Review of Systems were negative.  Past Medical History  Past Medical History  Diagnosis Date  . Diabetes mellitus     on Lantus 30 U   . CAD (coronary artery disease)     a. S/p CABG and stenting b. L&R cath 01/09/15 LM patent, LAD 100% occluded, LCX moderate disease, RCA 100% occluded. SVG-RCA CTO, SVG-mid LAD patent with more distal LAD 70% stenosis. Recs for medical management.   Marland Kitchen PAD (peripheral artery disease) (HCC)   . Stroke [facility])     MRI 11/2011 with remote occipital lobe. MRA with moderate left focal vertebral artery stenosis  . Chronic combined systolic and diastolic CHF (congestive heart failure) (HCC)     a. 11/2011 Echo with EF 30-35%, global hypokinesis,  and inferior akinesis;  b. 12/2014 Echo: EF 50%, mod LVH, Ao sclerosis w/o stenosis, mildly dil LA, mild to mod RV dysfxn.  . Hyperlipidemia   . DDD (degenerative disc disease), lumbar   . MI (myocardial infarction) (HCC) 1997  . COPD (chronic obstructive pulmonary disease) (HCC)     a. prn and HS supplemental O2.  . Anemia   . Hypertension   . GERD (gastroesophageal reflux disease)   . History of IBS   . Hypothyroidism     Goiter  . Carotid artery occlusion   . CKD (chronic kidney disease), stage III     stage 3  . History of tracheostomy (HCC) 08/26/2013    Dr. Jenne Pane    Past Surgical History  Procedure Laterality Date  . Ptca    . Thyroidectomy    . Coronary artery bypass graft      2 vessel  . Carotid endarterectomy  ~2008    Left   . Cholecystectomy    . Tracheostomy tube placement   01/02/2012  . Angioplasty  [id number]    Aortogram by Dr. Italy McKenzie [facility])  . Pr vein bypass graft,aorto-fem-pop      Right common femoral-AK popliteal BPG & Right Popliteal-posterior tibial  . Pr vein bypass graft,aorto-fem-pop      Left Fem-pop BPG  . Carpal tunnel release Right   . Femoral-tibial bypass graft Left 06/11/2013    Procedure: BYPASS GRAFT  LEFT FEMORAL- POSTERIOR TIBIAL ARTERY/ REDO;  Surgeon: Sherren Kerns, MD;  Location: [facility] OR;  Service: Vascular;  Laterality: Left;  . Thrombectomy femoral artery Left 06/11/2013    Procedure: THROMBECTOMY FEMORAL ARTERY;  Surgeon: Sherren Kerns, MD;  Location: [facility] OR;  Service: Vascular;  Laterality: Left;  . Spine surgery  Oct. 27, 2014    Injection - Back  . Abdominal aortagram N/A 04/05/2013    Procedure: ABDOMINAL Ronny Flurry;  Surgeon: Sherren Kerns, MD;  Location: [facility] CATH LAB;  Service: Cardiovascular;  Laterality: N/A;  . Colonoscopy N/A 10/27/2014    Procedure: COLONOSCOPY;  Surgeon: Willis Modena, MD;  Location: [facility] ENDOSCOPY;  Service: Endoscopy;  Laterality: N/A;  . Left and right heart catheterization with coronary angiogram N/A 01/09/2015    Procedure: LEFT AND RIGHT HEART CATHETERIZATION WITH CORONARY ANGIOGRAM;  Surgeon: Marykay Lex, MD;  Location: [facility] CATH LAB;  Service: Cardiovascular;  Laterality: N/A;      Social History Social History  Substance Use Topics  . Smoking status: Former Smoker -- 1.00 packs/day for 50 years    Types: Cigarettes    Quit date: 01/24/2011  . Smokeless tobacco: Never Used  . Alcohol Use: No  Lives with daughter.  Ambulates with cane.  Mostly independent with ADLs.  Family History Family History  Problem Relation Age of Onset  . Hyperlipidemia Mother   . Other Mother     AAA  . Alzheimer's disease Mother   . Heart disease Mother   . Irregular heart beat Mother   . Diabetes Daughter   . Hypertension Daughter   Mother died at 27 yo from Alzheimers.  No known lung  disease in the family.  Prior to Admission medications   Medication Sig Start Date End Date Taking? Authorizing Provider  aspirin EC 81 MG tablet Take 81 mg by mouth daily.   Yes Historical Provider, MD  carvedilol (COREG) 25 MG tablet Take 25 mg by mouth 2 (two) times daily with a meal. 11/28/11  Yes Danley Danker, MD  clopidogrel (PLAVIX) 75 MG tablet Take 75 mg by mouth daily. 11/28/11  Yes Danley Danker, MD  dicyclomine (BENTYL) 20 MG tablet Take 20 mg by mouth 3 (three) times daily before meals.   Yes Historical Provider, MD  DULoxetine (CYMBALTA) 60 MG capsule Take 60 mg by mouth daily. 08/06/14  Yes Historical Provider, MD  ferrous sulfate 325 (65 FE) MG tablet Take 1 tablet (325 mg total) by mouth 2 (two) times daily with a meal. Patient taking differently: Take 325 mg by mouth daily.  10/29/14  Yes Starleen Arms, MD  furosemide (LASIX) 40 MG tablet Take 1 tablet (40 mg total) by mouth 2 (two) times daily. Patient taking differently: Take 40-80 mg by mouth 2 (two) times daily. 80 mg in the morning and 40 mg in the afternoon 08/11/13  Yes Blane Ohara, MD  gabapentin (NEURONTIN) 300 MG capsule Take 300 mg by mouth at bedtime.   Yes Historical Provider, MD  glucose ([employer] GLUCOSE) 4 GM chewable tablet Chew 1 tablet by mouth as needed for low blood sugar.   Yes Historical Provider, MD  Insulin Glargine (TOUJEO SOLOSTAR) 300 UNIT/ML SOPN Inject 16 Units into the skin at bedtime. Patient taking differently: Inject 68 Units into the skin at bedtime.  10/29/14  Yes Leana Roe Elgergawy, MD  ipratropium-albuterol (DUONEB) 0.5-2.5 (3) MG/3ML SOLN Take 3 mLs by nebulization 2 (two) times daily as needed (for shortnes of breath).    Yes Historical Provider, MD  irbesartan (AVAPRO) 300 MG tablet Take 1 tablet (300 mg total) by mouth at bedtime. 01/05/13  Yes Russella Dar, NP  isosorbide mononitrate (IMDUR) 30 MG 24 hr tablet Take 1 tablet (30 mg total) by mouth daily. Patient taking  differently: Take 15 mg by mouth daily.  02/04/15  Yes Ok Anis, NP  lansoprazole (PREVACID) 15 MG capsule Take 15 mg by mouth daily at 12 noon.   Yes Historical Provider, MD  levothyroxine (SYNTHROID, LEVOTHROID) 50 MCG tablet Take 50 mcg by mouth daily before breakfast.  02/06/15  Yes Historical Provider, MD  Multiple Vitamins-Minerals (WOMENS 50+ MULTI VITAMIN/MIN) TABS Take 1 tablet by mouth daily.   Yes Historical Provider, MD  nitroGLYCERIN (NITROSTAT) 0.4 MG SL tablet Place 0.4 mg under the tongue every 5 (five) minutes as needed. For chest pain   Yes Historical Provider, MD  OXYGEN-HELIUM IN Inhale 4 L into the lungs at bedtime.    Yes Historical Provider, MD  Probiotic Product (PROBIOTIC PO) Take 1 tablet by mouth 2 (two) times daily.   Yes Historical Provider, MD  simvastatin (ZOCOR) 40 MG tablet Take 40 mg by mouth every evening.  11/28/11  Yes Danley Danker, MD    Allergies  Allergen Reactions  . Aldactone [Spironolactone] Other (See Comments)    Severe hyperkalemia   . Lisinopril Other (See Comments) and Cough    Hypotension also  . Crestor [Rosuvastatin Calcium] Other (See Comments)    Muscle Pain  . Vicodin [Hydrocodone-Acetaminophen] Nausea And Vomiting    Physical Exam  Vitals  Blood pressure 131/54, pulse 70, temperature 97.7 F (36.5 C), temperature source Oral, resp. rate 34, SpO2 93 %.   General:  Well develop AA female, + Pallor ,  lying in bed in NAD, trach in place.  Psych:  Normal affect and insight, Not Suicidal or Homicidal, Awake Alert, Oriented X 3.  Neuro:   No F.N deficits, ALL C.Nerves Intact, Strength 5/5 all 4 extremities, Sensation intact all 4 extremities.  ENT:  Ears and Eyes appear Normal, Conjunctivae clear, PER. Moist oral mucosa without erythema or exudates.  Neck:  Supple, No lymphadenopathy appreciated  Respiratory:  Bilateral rhonchi with crackles.  No increased work of breathing.    Cardiac:  RRR, No Murmurs, no LE  edema noted, no JVD.  Increased abdominal girth.  Abdomen:  Positive bowel sounds, Soft, Non tender, Non distended,  No masses appreciated  Skin:  No Cyanosis, Normal Skin Turgor, No Skin Rash or Bruise.  Extremities:  Able to move all 4. 5/5 strength in each,  no effusions.  Data Review  Per Natale Lay office note of 9/26 her last 3 weights were:   07/20/15 184 lb (83.462 kg)  07/06/15 170 lb (77.111 kg)  06/08/15 169 lb (76.658 kg)         CBC  Recent Labs Lab 07/27/15 0744  WBC 6.0  HGB 10.9*  HCT 34.2*  PLT 149*  MCV 97.2  MCH 31.0  MCHC 31.9  RDW 14.1  LYMPHSABS 0.9  MONOABS 0.4  EOSABS 0.3  BASOSABS 0.0    Chemistries   Recent Labs Lab 07/20/15 1322 07/27/15 0744  NA 144 139  K 4.1 3.9  CL 108 107  CO2 29 25  GLUCOSE 236* 284*  BUN 42* 35*  CREATININE 1.33* 1.22*  CALCIUM 8.5 8.6*  AST  --  19  ALT  --  17  ALKPHOS  --  104  BILITOT  --  0.5    Cardiac Enzymes  Recent Labs Lab 07/27/15 0744  TROPONINI 0.06*    Imaging results:   Dg Chest 2 View  07/27/2015   CLINICAL DATA:  Shortness of breath.  Ex-smoker.  EXAM: CHEST  2 VIEW  COMPARISON:  01/06/2015.  FINDINGS: Stable enlarged cardiac silhouette and diffusely prominent interstitial markings mild diaphragmatic flattening on the lateral view. No pleural fluid seen. Stable post CABG changes. The tracheostomy tube is in satisfactory position. Left neck surgical clips are unchanged.  IMPRESSION: 1. Stable marked cardiomegaly and chronic interstitial lung disease with possible superimposed interstitial edema. 2. No acute abnormality.   Electronically Signed   By: Beckie Salts M.D.   On: 07/27/2015 08:37    My personal review of EKG:  Acute EKG changes when compared to 07/20/2015 - S-T depression in anterior leads, Flipped T waves in V4 - V6.   Assessment & Plan  Principal Problem:   Acute respiratory failure with hypoxia (HCC) Active Problems:   Abnormal EKG   Elevated  troponin   S/P CABG (coronary artery bypass graft)   Hx-TIA (transient ischemic attack)   Diabetes mellitus type 2, uncontrolled (HCC)   Hypertension   Hyperlipidemia   H/O hyperthyroidism s/p thyroidectomy   Subglottic stenosis w/chronic tracheostomy   Peripheral vascular disease, unspecified (HCC)   Acute on chronic combined systolic and diastolic congestive heart failure, NYHA class 4 (HCC)   Acute pulmonary edema (HCC)   Dyspnea   Acute respiratory failure with hypoxia In the setting of chronic tracheostomy for subglottic stenosis.  At baseline the patient uses 4L of oxygen only QHS. Patient apparently has patient of both CHF exacerbation and COPD exacerbation Requiring 4 L of oxygen 24 hours a day. Will continue supportive care with oxygen, and treat her to exacerbations.  COPD exacerbation Thick yellow streaked with brown secretions from her tracheostomy. Will check a sputum culture. Started on azithromycin and Rocephin, Solu-Medrol, and nebulizers.  Oxygen titrated to an O2 sat of 88-92%  Acute on chronic  systolic CHF exacerbation Evidenced by weight gain and chest x-ray. Will diurese with IV Lasix. Strict I's & Os. Daily weights, low salt diet, continue coreg. Follow-up chest x-ray in a.m. Cardiology consulted. She sees Dr. Michele Rockers Will repeat 2-D echo. Most recent cardiology workup shows a variety of LVEF (50% on 2-D echo, 25% on nuclear stress test, 45% on left heart cath in March 2016)  Elevated Troponin with EKG changes in the setting of known CAD Patient denies chest pain.  Will cycle troponin.  Cardiology consulted. Continue 81 mg aspirin, Plavix, statin  Hypertension Continue Coreg, Imdur, Avapro, diuresis with IV lasix Patient with elevated BP in the ER (SBP 189).  Hydralazine IV PRN.  Hx of CVA and PAD Continue Aspirin / Plavix / Statin  Hypothyroidism Continue Synthroid. Patient's daughter reports dose was increased approximately 1 month ago.  CKD  stage 3 Creatinine is at baseline.  Will continue Avapro and diuretics.  Diabetes Mellitus Uncontrolled Hold toujeo.  Place on Lantus and novolog.  Check A1C. Please titrate insulin as needed.    Consultants Called:  None  Family Communication:   Daughter, Elonda Husky at bedside.  Code Status:  DNR  Condition:  Guarded.  Potential Disposition: Likely to home in 2-3 days when breathing in improved.  PT / OT consulted.  Time spent in minutes : [address]   [facility] Algis Downs,  [place] on 07/27/2015 at 11:34 AM Between 7am to 7pm - Pager - [phone] After 7pm go to [email or web address] - password [email or web address] And look for the night coverage person covering me after hours

## 2015-07-27 NOTE — Progress Notes (Signed)
Pt arrived to floor from the ED via stretcher.  A&0 x 4 .  Oriented to room.  Call bell at bedside.  Instructed to call as needed.  Will continue monitor.  Conall Vangorder,RN.

## 2015-07-27 NOTE — Consult Note (Signed)
   [facility] CM Inpatient Consult   07/27/2015  [name] 28-Dec-1945 [id number] Notification received from [facility] Nurse.  Patient is currently active (receiving services up to admission) with [facility] Care Management for chronic disease and care management services.  Patient has been engaged by a [employer]. Our community based plan of care has focused on disease management and community resource support.  Patient will receive a post discharge transition of care call and will be evaluated for monthly home visits for assessments and disease process education.  Will update inpatient RNCM regarding [facility] following.   Of note, [facility] Care Management services does not replace or interfere with any services that are arranged by inpatient case management or social work.  For additional questions or referrals please contact: Charlesetta Shanks, RN BSN CCM [facility]  [phone] business mobile phone

## 2015-07-27 NOTE — Progress Notes (Signed)
Dr Susie Cassette notified via text of pt's arrival to floor.  Amanda Pea, RN

## 2015-07-27 NOTE — ED Notes (Signed)
Pt. Presents with SOB starting yesterday. Pt. With trach, uses 4L O2 at night with intermittent use during day PRN. Pt. States she has required O2 since 6PM yesterday. Pt. States dr. Is trying to remove fluid from abd with increased lasix.

## 2015-07-28 ENCOUNTER — Inpatient Hospital Stay (HOSPITAL_COMMUNITY): Payer: Medicare Other

## 2015-07-28 ENCOUNTER — Encounter (HOSPITAL_COMMUNITY): Payer: Self-pay | Admitting: General Practice

## 2015-07-28 DIAGNOSIS — J386 Stenosis of larynx: Secondary | ICD-10-CM

## 2015-07-28 DIAGNOSIS — R0600 Dyspnea, unspecified: Secondary | ICD-10-CM

## 2015-07-28 DIAGNOSIS — E1122 Type 2 diabetes mellitus with diabetic chronic kidney disease: Secondary | ICD-10-CM

## 2015-07-28 DIAGNOSIS — N183 Chronic kidney disease, stage 3 (moderate): Secondary | ICD-10-CM

## 2015-07-28 DIAGNOSIS — I5033 Acute on chronic diastolic (congestive) heart failure: Secondary | ICD-10-CM

## 2015-07-28 DIAGNOSIS — J9621 Acute and chronic respiratory failure with hypoxia: Secondary | ICD-10-CM

## 2015-07-28 DIAGNOSIS — Z794 Long term (current) use of insulin: Secondary | ICD-10-CM

## 2015-07-28 DIAGNOSIS — R7989 Other specified abnormal findings of blood chemistry: Secondary | ICD-10-CM

## 2015-07-28 LAB — GLUCOSE, CAPILLARY
GLUCOSE-CAPILLARY: 259 mg/dL — AB (ref 65–99)
GLUCOSE-CAPILLARY: 191 mg/dL — AB (ref 65–99)
Glucose-Capillary: 400 mg/dL — ABNORMAL HIGH (ref 65–99)
Glucose-Capillary: 211 mg/dL — ABNORMAL HIGH (ref 65–99)
Glucose-Capillary: 211 mg/dL — ABNORMAL HIGH (ref 65–99)

## 2015-07-28 LAB — CBC
HEMATOCRIT: 35.9 % — AB (ref 36.0–46.0)
Hemoglobin: 11.7 g/dL — ABNORMAL LOW (ref 12.0–15.0)
MCH: 31 pg (ref 26.0–34.0)
MCHC: 32.6 g/dL (ref 30.0–36.0)
MCV: 95 fL (ref 78.0–100.0)
Platelets: 167 10*3/uL (ref 150–400)
RBC: 3.78 MIL/uL — ABNORMAL LOW (ref 3.87–5.11)
RDW: 14 % (ref 11.5–15.5)
WBC: 11.3 10*3/uL — AB (ref 4.0–10.5)

## 2015-07-28 LAB — BASIC METABOLIC PANEL
Anion gap: 10 (ref 5–15)
BUN: 55 mg/dL — AB (ref 6–20)
CHLORIDE: 100 mmol/L — AB (ref 101–111)
CO2: 25 mmol/L (ref 22–32)
Calcium: 8.6 mg/dL — ABNORMAL LOW (ref 8.9–10.3)
Creatinine, Ser: 1.89 mg/dL — ABNORMAL HIGH (ref 0.44–1.00)
GFR calc Af Amer: 30 mL/min — ABNORMAL LOW (ref >60)
GFR calc non Af Amer: 26 mL/min — ABNORMAL LOW (ref >60)
Glucose, Bld: 400 mg/dL — ABNORMAL HIGH (ref 65–99)
POTASSIUM: 4.4 mmol/L (ref 3.5–5.1)
SODIUM: 135 mmol/L (ref 135–145)

## 2015-07-28 LAB — HEMOGLOBIN A1C
Hgb A1c MFr Bld: 9.7 % — ABNORMAL HIGH (ref 4.8–5.6)
Mean Plasma Glucose: 232 mg/dL

## 2015-07-28 LAB — URINE CULTURE

## 2015-07-28 LAB — TROPONIN I: TROPONIN I: 0.04 ng/mL — AB (ref <0.031)

## 2015-07-28 MED ORDER — INSULIN ASPART 100 UNIT/ML ~~LOC~~ SOLN
0.0000 [IU] | Freq: Three times a day (TID) | SUBCUTANEOUS | Status: DC
  Administered 2015-07-28: 3 [IU] via SUBCUTANEOUS
  Administered 2015-07-28: 15 [IU] via SUBCUTANEOUS
  Administered 2015-07-28: 5 [IU] via SUBCUTANEOUS
  Administered 2015-07-29: 8 [IU] via SUBCUTANEOUS
  Administered 2015-07-29: 2 [IU] via SUBCUTANEOUS
  Administered 2015-07-29 – 2015-07-30 (×2): 3 [IU] via SUBCUTANEOUS

## 2015-07-28 MED ORDER — INSULIN ASPART 100 UNIT/ML ~~LOC~~ SOLN
6.0000 [IU] | Freq: Three times a day (TID) | SUBCUTANEOUS | Status: DC
  Administered 2015-07-28 – 2015-07-29 (×5): 6 [IU] via SUBCUTANEOUS

## 2015-07-28 MED ORDER — FUROSEMIDE 10 MG/ML IJ SOLN
40.0000 mg | Freq: Two times a day (BID) | INTRAMUSCULAR | Status: DC
  Administered 2015-07-28 – 2015-07-29 (×2): 40 mg via INTRAVENOUS
  Filled 2015-07-28 (×2): qty 4

## 2015-07-28 MED ORDER — INSULIN ASPART 100 UNIT/ML ~~LOC~~ SOLN
0.0000 [IU] | Freq: Every day | SUBCUTANEOUS | Status: DC
  Administered 2015-07-29: 2 [IU] via SUBCUTANEOUS

## 2015-07-28 NOTE — Progress Notes (Signed)
SATURATION QUALIFICATIONS: (This note is used to comply with regulatory documentation for home oxygen)  Patient Saturations on Room Air at Rest = 88%  Patient Saturations on Room Air while Ambulating = 85%  Patient Saturations on 35% -40% FiO2 trach collar Liters of oxygen while Ambulating = 90-94%  Please briefly explain why patient needs home oxygen:Pt required 35-40% FiO2 trach collar to keep sats above 90% with PMV in place. Thanks.  [facility] Acute Rehabilitation [phone] (pager)

## 2015-07-28 NOTE — Progress Notes (Signed)
Inpatient Diabetes Program Recommendations  AACE/ADA: New Consensus Statement on Inpatient Glycemic Control (2015)  Target Ranges:  Prepandial:   less than 140 mg/dL      Peak postprandial:   less than 180 mg/dL (1-2 hours)      Critically ill patients:  140 - 180 mg/dL    Results for [name], [name] (MRN [id number]) as of 07/28/2015 07:43  Ref. Range 01/13/2015 06:43 01/13/2015 11:04 07/27/2015 11:00 07/27/2015 17:34 07/27/2015 21:09  Glucose-Capillary Latest Ref Range: 65-99 mg/dL 191 (H) 478 (H) 295 (H) 360 (H) 410 (H)    Results for [name], [name] (MRN [id number]) as of 07/28/2015 07:43  Ref. Range 07/28/2015 06:19  Glucose-Capillary Latest Ref Range: 65-99 mg/dL 846 (H)    Home DM Meds: Toujeo insulin- 68 units QHS       Novolog 1-12 units tid per SSI  Current Insulin Orders: Lantus 20 units QHS      Novolog Moderate SSI (0-15 units) TID AC + HS      Novolog 6 units tidwc     -Note patient currently receiving IV Solumedrol 60 mg Q12 hours.  -Glucose levels significantly elevated.  -Note Novolog SSI and Novolog Meal Coverage both increased this AM by Dr. Arbutus Leas.     MD- Per Home Med Rec, patient stated she is taking Toujeo basal insulin 68 units QHS at home.  Please consider increasing Lantus to 34 units QHS (50% of stated home dose)     ----Will follow patient during hospitalization----  Ambrose Finland RN, MSN, CDE Diabetes Coordinator Inpatient Glycemic Control Team Team Pager: [phone] (8a-5p)

## 2015-07-28 NOTE — Progress Notes (Signed)
Patient Profile: 69 year old female with past medical history of coronary artery disease status post coronary artery bypass graft, ischemic cardiomyopathy, diabetes mellitus, hypertension, hyperlipidemia, COPD, prior tracheostomy for subglottic stenosis and renal insufficiency who we are asked to evaluate for acute on chronic combined systolic/diastolic congestive heart failure. Also receiving empiric antibiotics for presume bronchitis.   Subjective: Doing better. Breathing improved. Abdominal distention also improved. No chest pain.   Objective: Vital signs in last 24 hours: Temp:  [97.8 F (36.6 C)-98 F (36.7 C)] 98 F (36.7 C) (10/04 0449) Pulse Rate:  [69-80] 70 (10/04 0449) Resp:  [18-34] 18 (10/04 0449) BP: (128-192)/(54-90) 158/71 mmHg (10/04 0449) SpO2:  [16 %-100 %] 93 % (10/04 0449) FiO2 (%):  [40 %] 40 % (10/04 0449) Weight:  [179 lb 4.8 oz (81.33 kg)] 179 lb 4.8 oz (81.33 kg) (10/04 0449) Last BM Date: 07/27/15  Intake/Output from previous day: 10/03 0701 - 10/04 0700 In: 780 [P.O.:480; IV Piggyback:300] Out: -  Intake/Output this shift: Total I/O In: 240 [P.O.:240] Out: -   Medications Current Facility-Administered Medications  Medication Dose Route Frequency Provider Last Rate Last Dose  . 0.9 %  sodium chloride infusion  250 mL Intravenous PRN Stephani Police, PA-C      . alum & mag hydroxide-simeth (MAALOX/MYLANTA) 200-200-20 MG/5ML suspension 30 mL  30 mL Oral Q6H PRN Stephani Police, PA-C      . aspirin EC tablet 81 mg  81 mg Oral Daily Stephani Police, PA-C   81 mg at 07/27/15 1522  . azithromycin (ZITHROMAX) 500 mg in dextrose 5 % 250 mL IVPB  500 mg Intravenous Q24H Stephani Police, PA-C   500 mg at 07/27/15 1252  . carvedilol (COREG) tablet 25 mg  25 mg Oral BID WC Stephani Police, PA-C   25 mg at 07/28/15 0655  . cefTRIAXone (ROCEPHIN) 1 g in dextrose 5 % 50 mL IVPB  1 g Intravenous Q24H Stephani Police, PA-C   1 g at 07/27/15 1200  . clopidogrel  (PLAVIX) tablet 75 mg  75 mg Oral Daily Stephani Police, PA-C   75 mg at 07/27/15 1301  . dicyclomine (BENTYL) tablet 20 mg  20 mg Oral TID PRN Stephani Police, PA-C   20 mg at 07/27/15 2246  . DULoxetine (CYMBALTA) DR capsule 60 mg  60 mg Oral Daily Stephani Police, PA-C   60 mg at 07/27/15 1301  . ferrous sulfate tablet 325 mg  325 mg Oral Q breakfast Stephani Police, PA-C   325 mg at 07/28/15 1610  . furosemide (LASIX) injection 60 mg  60 mg Intravenous BID Stephani Police, PA-C   60 mg at 07/27/15 1731  . gabapentin (NEURONTIN) capsule 300 mg  300 mg Oral Daily Stephani Police, PA-C   300 mg at 07/27/15 1301  . glucose chewable tablet 4 g  1 tablet Oral PRN Stephani Police, PA-C      . hydrALAZINE (APRESOLINE) injection 10 mg  10 mg Intravenous Q6H PRN Stephani Police, PA-C      . insulin aspart (novoLOG) injection 0-15 Units  0-15 Units Subcutaneous TID WC Catarina Hartshorn, MD   15 Units at 07/28/15 0654  . insulin aspart (novoLOG) injection 0-5 Units  0-5 Units Subcutaneous QHS David Tat, MD      . insulin aspart (novoLOG) injection 6 Units  6 Units Subcutaneous TID WC Catarina Hartshorn, MD      . insulin glargine (  LANTUS) injection 20 Units  20 Units Subcutaneous QHS Stephani Police, PA-C   20 Units at 07/27/15 2253  . ipratropium-albuterol (DUONEB) 0.5-2.5 (3) MG/3ML nebulizer solution 3 mL  3 mL Nebulization BID PRN Stephani Police, PA-C      . ipratropium-albuterol (DUONEB) 0.5-2.5 (3) MG/3ML nebulizer solution 3 mL  3 mL Nebulization Q6H Marianne L York, PA-C   3 mL at 07/27/15 1942  . irbesartan (AVAPRO) tablet 300 mg  300 mg Oral QHS Stephani Police, PA-C   300 mg at 07/27/15 2245  . isosorbide mononitrate (IMDUR) 24 hr tablet 15 mg  15 mg Oral Daily Stephani Police, PA-C   15 mg at 07/27/15 1301  . levothyroxine (SYNTHROID, LEVOTHROID) tablet 50 mcg  50 mcg Oral QAC breakfast Stephani Police, PA-C   50 mcg at 07/28/15 0650  . methylPREDNISolone sodium succinate (SOLU-MEDROL) 125 mg/2 mL injection 60  mg  60 mg Intravenous Q12H Stephani Police, PA-C   60 mg at 07/27/15 2244  . nitroGLYCERIN (NITROSTAT) SL tablet 0.4 mg  0.4 mg Sublingual Q5 min PRN Stephani Police, PA-C      . ondansetron [facility]) tablet 4 mg  4 mg Oral Q6H PRN Stephani Police, PA-C       Or  . ondansetron (ZOFRAN) injection 4 mg  4 mg Intravenous Q6H PRN Stephani Police, PA-C      . pantoprazole (PROTONIX) EC tablet 40 mg  40 mg Oral BID Stephani Police, PA-C   40 mg at 07/27/15 2244  . potassium chloride SA (K-DUR,KLOR-CON) CR tablet 40 mEq  40 mEq Oral Daily Stephani Police, PA-C   40 mEq at 07/27/15 1301  . senna-docusate (Senokot-S) tablet 1 tablet  1 tablet Oral QHS PRN Stephani Police, PA-C      . simvastatin (ZOCOR) tablet 40 mg  40 mg Oral QPM Stephani Police, PA-C   40 mg at 07/27/15 1732  . sodium chloride 0.9 % injection 3 mL  3 mL Intravenous Q12H Marianne L York, PA-C   3 mL at 07/27/15 1115  . sodium chloride 0.9 % injection 3 mL  3 mL Intravenous Q12H Stephani Police, PA-C   3 mL at 07/27/15 2245  . sodium chloride 0.9 % injection 3 mL  3 mL Intravenous PRN Katheren Puller Weights   07/28/15 0449  Weight: 179 lb 4.8 oz (81.33 kg)   PE: General appearance: alert, cooperative and no distress Neck: no carotid bruit, no JVD and + trach. No secretions Lungs: decreased BS at the bases with faint bibasilar rales Heart: regular rate and rhythm Extremities: no LEE Pulses: 2+ and symmetric Skin: warm and dry Neurologic: Grossly normal  Lab Results:   Recent Labs  07/27/15 0744 07/28/15 0512  WBC 6.0 11.3*  HGB 10.9* 11.7*  HCT 34.2* 35.9*  PLT 149* 167   BMET  Recent Labs  07/27/15 0744 07/27/15 1211 07/28/15 0512  NA 139  --  135  K 3.9  --  4.4  CL 107  --  100*  CO2 25  --  25  GLUCOSE 284*  --  400*  BUN 35*  --  55*  CREATININE 1.22* 1.27* 1.89*  CALCIUM 8.6*  --  8.6*      Assessment/Plan    Principal Problem:   Acute respiratory failure with hypoxia  (HCC) Active Problems:   S/P CABG (coronary artery bypass graft)  Hx-TIA (transient ischemic attack)   Diabetes mellitus type 2, uncontrolled (HCC)   Hypertension   Hyperlipidemia   H/O hyperthyroidism s/p thyroidectomy   Subglottic stenosis w/chronic tracheostomy   Peripheral vascular disease, unspecified (HCC)   Acute on chronic combined systolic and diastolic congestive heart failure, NYHA class 4 (HCC)   Acute pulmonary edema (HCC)   Dyspnea   Abnormal EKG   Elevated troponin   Acute on chronic combined systolic and diastolic CHF (congestive heart failure) (HCC)  1. Acute Combined Systolic + Diastolic CHF: I/Os are not being recorded however patient notes subjective improvement since admission. She notes good UOP with IV lasix. Her dry weight is 169-170 lb. Her weight today is 179 lb based on our scales. Still with faint bibasilar rales on exam. She needs further diuresis, but now with a/c renal insufficiency with bump in SCr to 1.89 today. May need to be less aggressive with IV diuretics. She is currently on 60 mg IV BID. Will defer further dosing to MD.   2. CAD: s/p CABG with most recent cath in 12/2014 showing LM patent, LAD 100% occluded, RCA 100% occluded, SVG-RCA CTO, SVG-mid LAD, distal LAD 70% stenosis. Medical therapy elected at that time. No recent chest pain. Troponin with flat low level trend, 0.06, 0.05, 0.04. Likely secondary to demand ischemia from CHF.   3. ?Acute Bronchitis: empiric treatment with antibiotics per IM.   4. Abnormal Troponin: flat low level trend 0.06, 0.05, 0.04. Not diagnostic of an ACS. Likely secondary to demand ischemia from CHF. No further w/u at this time.   5. Chronic Renal Insufficieny: Scr today is up at 1.89. Baseline is 1.2-1.4. Admission Scr was 1.22.   6. HTN: still moderately elevated. She is on high doses of Coreg and Avapro. There is room to increase Imdur. Consider increasing to 30 mg daily for added BP control.   7. HLD: on statin.    8. DM: poorly controlled. Hgb A1c is 9.7. Goal is <7.0. Management per primary.      LOS: 1 day    Brittainy M. Sharol Harness, PA-C 07/28/2015 7:44 AM  As above, patient seen and examined. Her dyspnea has improved this morning. She is mildly volume overloaded. Her renal function is worse. Decrease Lasix to 40 mg IV twice a day. Recheck renal function tomorrow morning. We will need to tolerate some degree of renal insufficiency to keep patient euvolemic. Continue Antibiotics for possible contribution from bronchitis. Minimal elevation in troponin with no clear trend is not diagnostic of acute coronary syndrome. No further ischemia evaluation. Olga Millers

## 2015-07-28 NOTE — Progress Notes (Signed)
*  PRELIMINARY RESULTS* Echocardiogram 2D Echocardiogram has been performed.  [name] 07/28/2015, 2:28 PM

## 2015-07-28 NOTE — Progress Notes (Signed)
PROGRESS NOTE  [name] ZOX:[id number] DOB: Mar 10, 1946 DOA: 07/27/2015 PCP: Cala Bradford, MD  Brief history  69 year old female with a history of chronic respiratory failure on 4 L at home at nighttime, subglottic stenosis status post tracheostomy, systolic CHF, CKD stage III, and stroke presented with one-day history of increasing shortness of breath. The patient also complained of increasing thick yellow and brown tracheal secretions over a period of 2-3 days. The patient also has noted increasing abdominal girth and has gained 12 pounds over the past month. She recently saw cardiology whom increased her furosemide to 80 mg in the morning, 40 mg in the evening on 07/20/2015. At the time of admission, the patient was noted to be fluid overloaded with pulmonary edema. She was started on intravenous furosemide with good clinical effect.  Assessment/Plan: Acute on chronic respiratory failure with hypoxia  -Secondary to CHF and tracheobronchitis -wean oxygen as tolerated back to home demand -stable presently on trach collar -normally 4L at night time at home Acute on chronic systolic and diastolic CHF  -Dry weight appears to be 165-167  -Patient still appears a little bit on hypervolemic side  -Continue intravenous furosemide  -Appreciate cardiology follow-up  -Furosemide IV decreased to 40 mg twice a day due to increasing creatinine -01/07/2015 echocardiogram EF 50%  Acute Tracheobronchitis -continue ceftriaxone and azithromycin D#2 -d/c steroids Elevated Troponin/CAD -demand ischemia and CHF exacerbation in setting of CKD -no signs of angina -EKG with nonspecific changes -s/p CABG with most recent cath in 12/2014 showing LM patent, LAD 100% occluded, RCA 100% occluded, SVG-RCA CTO, SVG-mid LAD, distal LAD 70% stenosis. Medical therapy elected at that time.  HTN -continue imdur, coreg, avapro Diabetes mellitus type 2 with renal manifestations - continue Lantus 20  units daily  -NovoLog sliding scale  -NovoLog 6 units with meals  CKD 3 -Baseline creatinine 1.1-1.4 -Monitor with diuresis -hold irbesartan due to increasing creatinine   Family Communication:   Daughter updated at beside Disposition Plan:   Home when medically stable        Procedures/Studies: X-ray Chest Pa And Lateral  07/28/2015   CLINICAL DATA:  Shortness of Breath  EXAM: CHEST - 2 VIEW  COMPARISON:  07/27/2015  FINDINGS: Tracheostomy tube is again noted in satisfactory position. Postsurgical changes are again seen. Stable cardiomegaly is noted. The degree of interstitial edema has improved significantly in the interval from the previous day. Some minimal lingular infiltrate is again noted. No sizable effusion is seen.  IMPRESSION: Minimal lingular infiltrate.  Significant improvement in interstitial edema from the previous day.   Electronically Signed   By: Alcide Clever M.D.   On: 07/28/2015 09:08   Dg Chest 2 View  07/27/2015   CLINICAL DATA:  Shortness of breath.  Ex-smoker.  EXAM: CHEST  2 VIEW  COMPARISON:  01/06/2015.  FINDINGS: Stable enlarged cardiac silhouette and diffusely prominent interstitial markings mild diaphragmatic flattening on the lateral view. No pleural fluid seen. Stable post CABG changes. The tracheostomy tube is in satisfactory position. Left neck surgical clips are unchanged.  IMPRESSION: 1. Stable marked cardiomegaly and chronic interstitial lung disease with possible superimposed interstitial edema. 2. No acute abnormality.   Electronically Signed   By: Beckie Salts M.D.   On: 07/27/2015 08:37         Subjective: patient still has some dyspnea on exertion but is breathing better. Denies any fevers, chills, chest pain, hemoptysis, nausea, vomiting, diarrhea, abdominal pain,  hematochezia, melena. No dysuria or hematuria. No headaches or neck pain. No rashes.   Objective: Filed Vitals:   07/28/15 0751 07/28/15 0752 07/28/15 0803 07/28/15 1201  BP:    157/82   Pulse:  75 72 75  Temp:   98.5 F (36.9 C)   TempSrc:      Resp:  16  20  Weight:      SpO2: 97% 97%      Intake/Output Summary (Last 24 hours) at 07/28/15 1202 Last data filed at 07/28/15 0703  Gross per 24 hour  Intake    970 ml  Output      0 ml  Net    970 ml   Weight change:  Exam:   General:  Pt is alert, follows commands appropriately, not in acute distress  HEENT: No icterus, No thrush, No neck mass, [place]/AT  Cardiovascular: RRR, S1/S2, no rubs, no gallops  Respiratory: bibasilar crackles. No wheezing. Good air movement.   Abdomen: Soft/+BS, non tender, non distended, no guarding; no hepatosplenomegaly   Extremities: No edema, No lymphangitis, No petechiae, No rashes, no synovitis; no cyanosis or clubbing   Data Reviewed: Basic Metabolic Panel:  Recent Labs Lab 07/27/15 0744 07/27/15 1211 07/28/15 0512  NA 139  --  135  K 3.9  --  4.4  CL 107  --  100*  CO2 25  --  25  GLUCOSE 284*  --  400*  BUN 35*  --  55*  CREATININE 1.22* 1.27* 1.89*  CALCIUM 8.6*  --  8.6*   Liver Function Tests:  Recent Labs Lab 07/27/15 0744  AST 19  ALT 17  ALKPHOS 104  BILITOT 0.5  PROT 6.8  ALBUMIN 3.4*   No results for input(s): LIPASE, AMYLASE in the last 168 hours. No results for input(s): AMMONIA in the last 168 hours. CBC:  Recent Labs Lab 07/27/15 0744 07/28/15 0512  WBC 6.0 11.3*  NEUTROABS 4.5  --   HGB 10.9* 11.7*  HCT 34.2* 35.9*  MCV 97.2 95.0  PLT 149* 167   Cardiac Enzymes:  Recent Labs Lab 07/27/15 0744 07/27/15 1211 07/27/15 1650 07/27/15 2340  TROPONINI 0.06* 0.05* 0.05* 0.04*   BNP: Invalid input(s): POCBNP CBG:  Recent Labs Lab 07/27/15 1100 07/27/15 1734 07/27/15 2109 07/28/15 0619 07/28/15 1124  GLUCAP 227* 360* 410* 400* 259*    Recent Results (from the past 240 hour(s))  Urine culture     Status: None (Preliminary result)   Collection Time: 07/27/15  4:49 PM  Result Value Ref Range Status   Specimen  Description URINE, RANDOM  Final   Special Requests NONE  Final   Culture TOO YOUNG TO READ  Final   Report Status PENDING  Incomplete     Scheduled Meds: . aspirin EC  81 mg Oral Daily  . azithromycin  500 mg Intravenous Q24H  . carvedilol  25 mg Oral BID WC  . cefTRIAXone (ROCEPHIN)  IV  1 g Intravenous Q24H  . clopidogrel  75 mg Oral Daily  . DULoxetine  60 mg Oral Daily  . ferrous sulfate  325 mg Oral Q breakfast  . furosemide  40 mg Intravenous BID  . gabapentin  300 mg Oral Daily  . insulin aspart  0-15 Units Subcutaneous TID WC  . insulin aspart  0-5 Units Subcutaneous QHS  . insulin aspart  6 Units Subcutaneous TID WC  . insulin glargine  20 Units Subcutaneous QHS  . ipratropium-albuterol  3 mL Nebulization Q6H  .  irbesartan  300 mg Oral QHS  . isosorbide mononitrate  15 mg Oral Daily  . levothyroxine  50 mcg Oral QAC breakfast  . methylPREDNISolone (SOLU-MEDROL) injection  60 mg Intravenous Q12H  . pantoprazole  40 mg Oral BID  . potassium chloride  40 mEq Oral Daily  . simvastatin  40 mg Oral QPM  . sodium chloride  3 mL Intravenous Q12H  . sodium chloride  3 mL Intravenous Q12H   Continuous Infusions:    Renzo Vincelette, DO  [facility] Pager [phone]  If 7PM-7AM, please contact night-coverage [email or web address] Password [email or web address] 07/28/2015, 12:02 PM   LOS: 1 day

## 2015-07-28 NOTE — Progress Notes (Signed)
Occupational Therapy Treatment Patient Details Name: LINSEY ARTEAGA MRN: [id number] DOB: 1946/06/28 Today's Date: 07/28/2015    History of present illness 69 year old female with past medical history of coronary artery disease status post coronary artery bypass graft, ischemic cardiomyopathy, diabetes mellitus, hypertension, hyperlipidemia, COPD, prior tracheostomy for subglottic stenosis and renal insufficiency who we are asked to evaluate for acute on chronic combined systolic/diastolic congestive heart failure. Also receiving empiric antibiotics for presume bronchitis.    OT comments  Pt making good progress with functional goals. Pt and her daughter provided with energy conservation training and handout. OT will continue to follow  Follow Up Recommendations  No OT follow up;Supervision - Intermittent    Equipment Recommendations  None recommended by OT    Recommendations for Other Services      Precautions / Restrictions Precautions Precautions: Fall Restrictions Weight Bearing Restrictions: No       Mobility Bed Mobility Overal bed mobility: Independent Bed Mobility: Supine to Sit;Sit to Supine     Supine to sit: Supervision Sit to supine: Supervision      Transfers Overall transfer level: Needs assistance Equipment used: None Transfers: Sit to/from Stand Sit to Stand: Supervision Stand pivot transfers: Supervision       General transfer comment: Supervision for safety. Cues for hand placement and overall safety with RW.     Balance Overall balance assessment: Needs assistance Sitting-balance support: No upper extremity supported;Feet supported Sitting balance-Leahy Scale: Good     Standing balance support: Bilateral upper extremity supported;During functional activity Standing balance-Leahy Scale: Fair Standing balance comment: can stand statically without device.                    ADL       Grooming: Wash/dry hands;Wash/dry  face;Standing;Supervision/safety   Upper Body Bathing: Set up;Sitting   Lower Body Bathing: Supervison/ safety;Set up   Upper Body Dressing : Set up;Sitting   Lower Body Dressing: Supervision/safety;Set up   Toilet Transfer: Supervision/safety;RW;Regular Toilet;Grab bars   Toileting- Clothing Manipulation and Hygiene: Modified independent       Functional mobility during ADLs: Supervision/safety General ADL Comments: provided pt with energy conservation education and handout      Vision  no change from baseline, wears glasses                   Perception Perception Perception Tested?: No         Cognition   Behavior During Therapy: WFL for tasks assessed/performed Overall Cognitive Status: Within Functional Limits for tasks assessed                       Extremity/Trunk Assessment    WFL                    General Comments  pt very pleasant and cooperative, daughter very supportive    Pertinent Vitals/ Pain       Pain Assessment: No/denies pain  Home Living Family/patient expects to be discharged to:: Private residence Living Arrangements: Children Available Help at Discharge: Family;Available PRN/intermittently (daughter works during day) Type of Home: House Home Access: Level entry     Home Layout: One level     Bathroom Shower/Tub: IT trainer: Standard Bathroom Accessibility: Yes   Home Equipment: Environmental consultant - 2 wheels;Cane - single point;Bedside commode;Shower seat;Hand held shower head          Prior Functioning/Environment Level of Independence: Independent with assistive device(s)  Comments: used cane vs RW, normally RW for community. pt reports she would cook and daughter would clean PTA.    Frequency Min 2X/week     Progress Toward Goals  OT Goals(current goals can now be found in the care plan section)  Progress towards OT goals: Progressing toward goals  Acute Rehab OT  Goals Patient Stated Goal: go home with daughter   Plan Discharge plan remains appropriate                     End of Session Equipment Utilized During Treatment: Rolling walker;Oxygen   Activity Tolerance Patient tolerated treatment well   Patient Left in bed;with call bell/phone within reach;with family/visitor present             Time: 1610-9604 OT Time Calculation (min): 17 min  Charges: OT General Charges $OT Visit: 1 Procedure OT Treatments $Self Care/Home Management : 8-22 mins  Galen Manila 07/28/2015, 1:42 PM

## 2015-07-28 NOTE — Evaluation (Signed)
Physical Therapy Evaluation Patient Details Name: [name] MRN: [id number] DOB: [birth year] Today's Date: 07/28/2015   History of Present Illness  69 year old female with past medical history of coronary artery disease status post coronary artery bypass graft, ischemic cardiomyopathy, diabetes mellitus, hypertension, hyperlipidemia, COPD, prior tracheostomy for subglottic stenosis and renal insufficiency who we are asked to evaluate for acute on chronic combined systolic/diastolic congestive heart failure. Also receiving empiric antibiotics for presume bronchitis.   Clinical Impression  Pt admitted with above diagnosis. Pt currently with functional limitations due to the deficits listed below (see PT Problem List). Pt ambulated well with O2 but did require the O2 to keep sats >90%.  See O2 sat note below and separate note in chart.  Will follow acutely.   Pt will benefit from skilled PT to increase their independence and safety with mobility to allow discharge to the venue listed below.      Follow Up Recommendations No PT follow up;Supervision - Intermittent    Equipment Recommendations  None recommended by PT    Recommendations for Other Services       Precautions / Restrictions Precautions Precautions: Fall Restrictions Weight Bearing Restrictions: No      Mobility  Bed Mobility Overal bed mobility: Independent                Transfers Overall transfer level: Needs assistance Equipment used: None Transfers: Sit to/from Stand;Stand Pivot Transfers Sit to Stand: Supervision Stand pivot transfers: Supervision          Ambulation/Gait Ambulation/Gait assistance: Min guard Ambulation Distance (Feet): 250 Feet Assistive device: Rolling walker (2 wheeled) Gait Pattern/deviations: Step-through pattern;Decreased stride length;Trunk flexed;Wide base of support   Gait velocity interpretation: Below normal speed for age/gender General Gait Details: Pt aware that  she needed to stay close to RW and would self correct wtihout cues.  Overall good safety with RW.  Pt did desat with PMV without O2.  Ambulated with O2 due to this.    Stairs            Wheelchair Mobility    Modified Rankin (Stroke Patients Only)       Balance Overall balance assessment: Needs assistance Sitting-balance support: No upper extremity supported;Feet supported Sitting balance-Leahy Scale: Good     Standing balance support: Bilateral upper extremity supported;During functional activity Standing balance-Leahy Scale: Fair Standing balance comment: can stand statically without device.                              Pertinent Vitals/Pain Pain Assessment: No/denies pain    SATURATION QUALIFICATIONS: (This note is used to comply with regulatory documentation for home oxygen)  Patient Saturations on Room Air at Rest = 88%  Patient Saturations on Room Air while Ambulating = 85%  Patient Saturations on 35% -40% FiO2 trach collar Liters of oxygen while Ambulating = 90-94%  Please briefly explain why patient needs home oxygen:Pt required 35-40% FiO2 trach collar to keep sats above 90% with PMV in place.   Home Living Family/patient expects to be discharged to:: Private residence Living Arrangements: Children Available Help at Discharge: Family;Available PRN/intermittently (daughter works during day) Type of Home: House Home Access: Level entry     Home Layout: One level Home Equipment: Environmental consultant - 2 wheels;Cane - single point;Bedside commode;Shower seat;Hand held shower head      Prior Function Level of Independence: Independent with assistive device(s)  Comments: used cane vs RW, normally RW for community. pt reports she would cook and daughter would clean PTA.      Hand Dominance   Dominant Hand: Right    Extremity/Trunk Assessment   Upper Extremity Assessment: Defer to OT evaluation           Lower Extremity Assessment:  Generalized weakness      Cervical / Trunk Assessment: Normal  Communication   Communication: No difficulties  Cognition Arousal/Alertness: Awake/alert Behavior During Therapy: WFL for tasks assessed/performed Overall Cognitive Status: Within Functional Limits for tasks assessed                      General Comments General comments (skin integrity, edema, etc.): Pt needed to use 3n1 on arrival.  Pt had O2 off with PMV in place and O2 at 88%.  Replaced trach collar at 35% FiO2 with sats >90%.  Assisted pt to 3N1 where pt had BM.  Pt was able to clean herself with set up only with PT obtaining toilet paper and washclothes.      Exercises        Assessment/Plan    PT Assessment Patient needs continued PT services  PT Diagnosis Generalized weakness   PT Problem List Decreased mobility;Decreased activity tolerance;Decreased balance;Decreased knowledge of use of DME;Decreased safety awareness  PT Treatment Interventions DME instruction;Gait training;Functional mobility training;Therapeutic activities;Therapeutic exercise;Balance training;Patient/family education   PT Goals (Current goals can be found in the Care Plan section) Acute Rehab PT Goals Patient Stated Goal: go home with daughter  PT Goal Formulation: With patient Time For Goal Achievement: 08/04/15 Potential to Achieve Goals: Good    Frequency Min 3X/week   Barriers to discharge        Co-evaluation               End of Session Equipment Utilized During Treatment: Gait belt;Oxygen Activity Tolerance: Patient limited by fatigue Patient left: with call bell/phone within reach;in bed;with family/visitor present Nurse Communication: Mobility status         Time: 0921-0955 PT Time Calculation (min) (ACUTE ONLY): 34 min   Charges:   PT Evaluation $Initial PT Evaluation Tier I: 1 Procedure PT Treatments $Gait Training: 8-22 mins   PT G CodesBerline [name] August 15, 2015, 11:31 AM  [name],PT Acute Rehabilitation [phone] (pager)

## 2015-07-29 ENCOUNTER — Other Ambulatory Visit: Payer: Self-pay

## 2015-07-29 DIAGNOSIS — E1165 Type 2 diabetes mellitus with hyperglycemia: Secondary | ICD-10-CM

## 2015-07-29 DIAGNOSIS — E1129 Type 2 diabetes mellitus with other diabetic kidney complication: Secondary | ICD-10-CM

## 2015-07-29 DIAGNOSIS — J0410 Acute tracheitis without obstruction: Secondary | ICD-10-CM | POA: Insufficient documentation

## 2015-07-29 DIAGNOSIS — I10 Essential (primary) hypertension: Secondary | ICD-10-CM

## 2015-07-29 LAB — BASIC METABOLIC PANEL
ANION GAP: 11 (ref 5–15)
BUN: 60 mg/dL — AB (ref 6–20)
CHLORIDE: 98 mmol/L — AB (ref 101–111)
CO2: 28 mmol/L (ref 22–32)
Calcium: 8.6 mg/dL — ABNORMAL LOW (ref 8.9–10.3)
Creatinine, Ser: 1.85 mg/dL — ABNORMAL HIGH (ref 0.44–1.00)
GFR calc Af Amer: 31 mL/min — ABNORMAL LOW (ref >60)
GFR calc non Af Amer: 27 mL/min — ABNORMAL LOW (ref >60)
GLUCOSE: 233 mg/dL — AB (ref 65–99)
POTASSIUM: 4.6 mmol/L (ref 3.5–5.1)
Sodium: 137 mmol/L (ref 135–145)

## 2015-07-29 LAB — GLUCOSE, CAPILLARY
GLUCOSE-CAPILLARY: 69 mg/dL (ref 65–99)
GLUCOSE-CAPILLARY: 97 mg/dL (ref 65–99)
GLUCOSE-CAPILLARY: 167 mg/dL — AB (ref 65–99)
GLUCOSE-CAPILLARY: 208 mg/dL — AB (ref 65–99)
Glucose-Capillary: 270 mg/dL — ABNORMAL HIGH (ref 65–99)
Glucose-Capillary: 150 mg/dL — ABNORMAL HIGH (ref 65–99)

## 2015-07-29 LAB — TROPONIN I: TROPONIN I: 0.06 ng/mL — AB (ref <0.031)

## 2015-07-29 MED ORDER — INSULIN GLARGINE 100 UNIT/ML ~~LOC~~ SOLN
20.0000 [IU] | Freq: Every day | SUBCUTANEOUS | Status: DC
  Administered 2015-07-29: 20 [IU] via SUBCUTANEOUS
  Filled 2015-07-29 (×2): qty 0.2

## 2015-07-29 MED ORDER — ISOSORBIDE MONONITRATE ER 30 MG PO TB24
30.0000 mg | ORAL_TABLET | Freq: Every day | ORAL | Status: DC
  Administered 2015-07-30: 30 mg via ORAL
  Filled 2015-07-29: qty 1

## 2015-07-29 MED ORDER — AMLODIPINE BESYLATE 5 MG PO TABS
5.0000 mg | ORAL_TABLET | Freq: Every day | ORAL | Status: DC
Start: 2015-07-29 — End: 2015-07-29

## 2015-07-29 MED ORDER — INSULIN ASPART 100 UNIT/ML ~~LOC~~ SOLN
6.0000 [IU] | Freq: Three times a day (TID) | SUBCUTANEOUS | Status: DC
  Administered 2015-07-30: 6 [IU] via SUBCUTANEOUS

## 2015-07-29 MED ORDER — CEFPODOXIME PROXETIL 200 MG PO TABS
100.0000 mg | ORAL_TABLET | Freq: Two times a day (BID) | ORAL | Status: DC
  Administered 2015-07-29 – 2015-07-30 (×3): 100 mg via ORAL
  Filled 2015-07-29 (×5): qty 1

## 2015-07-29 MED ORDER — FERROUS SULFATE 325 (65 FE) MG PO TABS
325.0000 mg | ORAL_TABLET | Freq: Every day | ORAL | Status: DC
  Administered 2015-07-30: 325 mg via ORAL
  Filled 2015-07-29: qty 1

## 2015-07-29 MED ORDER — INSULIN GLARGINE 100 UNIT/ML ~~LOC~~ SOLN
22.0000 [IU] | Freq: Every day | SUBCUTANEOUS | Status: DC
  Filled 2015-07-29: qty 0.22

## 2015-07-29 MED ORDER — GUAIFENESIN 100 MG/5ML PO SOLN
5.0000 mL | ORAL | Status: DC | PRN
  Administered 2015-07-29 (×2): 100 mg via ORAL
  Filled 2015-07-29: qty 5
  Filled 2015-07-29: qty 25

## 2015-07-29 MED ORDER — HYDRALAZINE HCL 25 MG PO TABS
25.0000 mg | ORAL_TABLET | Freq: Three times a day (TID) | ORAL | Status: DC
  Administered 2015-07-29 – 2015-07-30 (×4): 25 mg via ORAL
  Filled 2015-07-29 (×4): qty 1

## 2015-07-29 MED ORDER — CARVEDILOL 25 MG PO TABS
25.0000 mg | ORAL_TABLET | Freq: Two times a day (BID) | ORAL | Status: DC
  Administered 2015-07-30: 25 mg via ORAL
  Filled 2015-07-29: qty 1

## 2015-07-29 MED ORDER — FUROSEMIDE 80 MG PO TABS
80.0000 mg | ORAL_TABLET | Freq: Two times a day (BID) | ORAL | Status: DC
  Administered 2015-07-29 – 2015-07-30 (×2): 80 mg via ORAL
  Filled 2015-07-29 (×2): qty 1

## 2015-07-29 NOTE — Progress Notes (Signed)
Patient c/o chest pain. EKG done. Chest pain relieved with nitro. MD made aware.   VS .BP 106/60 mmHg  Pulse 76  Temp(Src) 98 F (36.7 C) (Oral)  Resp 16  Ht  (1.575 m)  Wt 79.924 kg (176 lb 3.2 oz)  BMI 32.22 kg/m2  SpO2 99%

## 2015-07-29 NOTE — Progress Notes (Addendum)
Patient Profile: 69 year old female with past medical history of coronary artery disease status post coronary artery bypass graft, ischemic cardiomyopathy, diabetes mellitus, hypertension, hyperlipidemia, COPD, prior tracheostomy for subglottic stenosis and renal insufficiency who we are asked to evaluate for acute on chronic combined systolic/diastolic congestive heart failure. Also receiving empiric antibiotics for presume bronchitis.   Subjective: Continues to feel better day by day. Breathing improved. Abdominal distention also improved. No chest pain. Only complaint is a dry productive cough.   Objective: Vital signs in last 24 hours: Temp:  [97.6 F (36.4 C)-98.5 F (36.9 C)] 97.6 F (36.4 C) (10/05 0440) Pulse Rate:  [70-79] 76 (10/05 0440) Resp:  [16-20] 18 (10/05 0440) BP: (141-161)/(66-82) 161/66 mmHg (10/05 0440) SpO2:  [91 %-100 %] 99 % (10/05 0440) FiO2 (%):  [21 %-35 %] 35 % (10/05 0440) Weight:  [176 lb 3.2 oz (79.924 kg)] 176 lb 3.2 oz (79.924 kg) (10/05 0440) Last BM Date: 07/28/15  Intake/Output from previous day: 10/04 0701 - 10/05 0700 In: 1020 [P.O.:720; IV Piggyback:300] Out: 1100 [Urine:1100] Intake/Output this shift:    Medications Current Facility-Administered Medications  Medication Dose Route Frequency Provider Last Rate Last Dose  . 0.9 %  sodium chloride infusion  250 mL Intravenous PRN Stephani Police, PA-C      . alum & mag hydroxide-simeth (MAALOX/MYLANTA) 200-200-20 MG/5ML suspension 30 mL  30 mL Oral Q6H PRN Stephani Police, PA-C      . aspirin EC tablet 81 mg  81 mg Oral Daily Stephani Police, PA-C   81 mg at 07/28/15 0948  . azithromycin (ZITHROMAX) 500 mg in dextrose 5 % 250 mL IVPB  500 mg Intravenous Q24H Stephani Police, PA-C   500 mg at 07/28/15 1041  . carvedilol (COREG) tablet 25 mg  25 mg Oral BID WC Stephani Police, PA-C   25 mg at 07/29/15 4098  . cefTRIAXone (ROCEPHIN) 1 g in dextrose 5 % 50 mL IVPB  1 g Intravenous Q24H Stephani Police, PA-C   1 g at 07/28/15 1235  . clopidogrel (PLAVIX) tablet 75 mg  75 mg Oral Daily Stephani Police, PA-C   75 mg at 07/28/15 0948  . dicyclomine (BENTYL) tablet 20 mg  20 mg Oral TID PRN Stephani Police, PA-C   20 mg at 07/27/15 2246  . DULoxetine (CYMBALTA) DR capsule 60 mg  60 mg Oral Daily Stephani Police, PA-C   60 mg at 07/28/15 0948  . ferrous sulfate tablet 325 mg  325 mg Oral Q breakfast Stephani Police, PA-C   325 mg at 07/28/15 1191  . furosemide (LASIX) injection 40 mg  40 mg Intravenous BID Lewayne Bunting, MD   40 mg at 07/28/15 1709  . gabapentin (NEURONTIN) capsule 300 mg  300 mg Oral Daily Stephani Police, PA-C   300 mg at 07/28/15 0947  . glucose chewable tablet 4 g  1 tablet Oral PRN Stephani Police, PA-C      . hydrALAZINE (APRESOLINE) injection 10 mg  10 mg Intravenous Q6H PRN Stephani Police, PA-C      . insulin aspart (novoLOG) injection 0-15 Units  0-15 Units Subcutaneous TID WC Catarina Hartshorn, MD   8 Units at 07/29/15 4782  . insulin aspart (novoLOG) injection 0-5 Units  0-5 Units Subcutaneous QHS Catarina Hartshorn, MD   0 Units at 07/28/15 2200  . insulin aspart (novoLOG) injection 6 Units  6 Units Subcutaneous TID WC Onalee Hua  Tat, MD   6 Units at 07/28/15 1709  . insulin glargine (LANTUS) injection 20 Units  20 Units Subcutaneous QHS Stephani Police, PA-C   20 Units at 07/28/15 2147  . ipratropium-albuterol (DUONEB) 0.5-2.5 (3) MG/3ML nebulizer solution 3 mL  3 mL Nebulization BID PRN Stephani Police, PA-C      . ipratropium-albuterol (DUONEB) 0.5-2.5 (3) MG/3ML nebulizer solution 3 mL  3 mL Nebulization Q6H Marianne L York, PA-C   3 mL at 07/29/15 0112  . isosorbide mononitrate (IMDUR) 24 hr tablet 15 mg  15 mg Oral Daily Stephani Police, PA-C   15 mg at 07/28/15 0947  . levothyroxine (SYNTHROID, LEVOTHROID) tablet 50 mcg  50 mcg Oral QAC breakfast Stephani Police, PA-C   50 mcg at 07/29/15 1610  . nitroGLYCERIN (NITROSTAT) SL tablet 0.4 mg  0.4 mg Sublingual Q5 min PRN Stephani Police, PA-C      . ondansetron [facility]) tablet 4 mg  4 mg Oral Q6H PRN Stephani Police, PA-C       Or  . ondansetron (ZOFRAN) injection 4 mg  4 mg Intravenous Q6H PRN Stephani Police, PA-C      . pantoprazole (PROTONIX) EC tablet 40 mg  40 mg Oral BID Stephani Police, PA-C   40 mg at 07/28/15 2147  . potassium chloride SA (K-DUR,KLOR-CON) CR tablet 40 mEq  40 mEq Oral Daily Stephani Police, PA-C   40 mEq at 07/28/15 0947  . senna-docusate (Senokot-S) tablet 1 tablet  1 tablet Oral QHS PRN Stephani Police, PA-C      . simvastatin (ZOCOR) tablet 40 mg  40 mg Oral QPM Stephani Police, PA-C   40 mg at 07/28/15 1709  . sodium chloride 0.9 % injection 3 mL  3 mL Intravenous Q12H Marianne L York, PA-C   3 mL at 07/28/15 1000  . sodium chloride 0.9 % injection 3 mL  3 mL Intravenous Q12H Stephani Police, PA-C   3 mL at 07/28/15 2147  . sodium chloride 0.9 % injection 3 mL  3 mL Intravenous PRN Conley Canal       [facility] Weights   07/28/15 0449 07/29/15 0440  Weight: 179 lb 4.8 oz (81.33 kg) 176 lb 3.2 oz (79.924 kg)   PE: General appearance: alert, cooperative and no distress Neck: no carotid bruit, no JVD and + trach. No secretions Lungs: decreased BS at the bases with faint basilar rales in RLL Heart: regular rate and rhythm Extremities: no LEE Pulses: 2+ and symmetric Skin: warm and dry Neurologic: Grossly normal  Lab Results:   Recent Labs  07/27/15 0744 07/28/15 0512  WBC 6.0 11.3*  HGB 10.9* 11.7*  HCT 34.2* 35.9*  PLT 149* 167   BMET  Recent Labs  07/27/15 0744 07/27/15 1211 07/28/15 0512 07/29/15 0338  NA 139  --  135 137  K 3.9  --  4.4 4.6  CL 107  --  100* 98*  CO2 25  --  25 28  GLUCOSE 284*  --  400* 233*  BUN 35*  --  55* 60*  CREATININE 1.22* 1.27* 1.89* 1.85*  CALCIUM 8.6*  --  8.6* 8.6*      Assessment/Plan    Principal Problem:   Acute respiratory failure with hypoxia (HCC) Active Problems:   S/P CABG (coronary artery bypass graft)    Hx-TIA (transient ischemic attack)   Diabetes mellitus type 2, uncontrolled (HCC)   Hypertension  Hyperlipidemia   H/O hyperthyroidism s/p thyroidectomy   Subglottic stenosis w/chronic tracheostomy   Peripheral vascular disease, unspecified (HCC)   Acute on chronic combined systolic and diastolic congestive heart failure, NYHA class 4 (HCC)   Acute pulmonary edema (HCC)   Dyspnea   Abnormal EKG   Elevated troponin   Acute on chronic combined systolic and diastolic CHF (congestive heart failure) (HCC)   CHF (congestive heart failure) (HCC)   Acute on chronic respiratory failure with hypoxia (HCC)   DM type 2 causing CKD stage 3 (HCC)  1. Acute Combined Systolic + Diastolic CHF: UOP 1.6X in past 24 hrs. Weight is down 3 lb since yesterday. She remains above her dry weight of 169-170 lb. Her weight today is 176 lb based on our scales. Still with faint basilar rales in RLL. Continue lasix, 40 mg IV BID. Continue to monitor renal function. Low sodium diet.   2. CAD: s/p CABG with most recent cath in 12/2014 showing LM patent, LAD 100% occluded, RCA 100% occluded, SVG-RCA CTO, SVG-mid LAD, distal LAD 70% stenosis. Medical therapy elected at that time. No recent chest pain. Troponin with flat low level trend, 0.06, 0.05, 0.04. Likely secondary to demand ischemia from CHF.   3. ?Acute Bronchitis: empiric treatment with antibiotics per IM.   4. Abnormal Troponin: flat low level trend 0.06, 0.05, 0.04. Not diagnostic of an ACS. Likely secondary to demand ischemia from CHF. No further w/u at this time.   5. Chronic Renal Insufficieny: Scr today is 1.85 (1.89 yesterday). Baseline is 1.2-1.4. Admission Scr was 1.22. Continue to monitor while on lasix.  6. HTN: still moderately elevated. She is on high doses of Coreg and Avapro. There is room to increase Imdur. Consider increasing to 30 mg daily for added BP control.   7. HLD: on statin.   8. DM: poorly controlled. Hgb A1c is 9.7. Goal is <7.0.  Management per primary.      LOS: 2 days    Becky M. Sharol Harness, PA-C 07/29/2015 7:46 AM  As above, patient seen and examined. Her dyspnea has improved. No chest pain. I will change her Lasix to 80 mg twice a day today and plan to discharge tomorrow on Lasix 80 mg in the morning and 40 mg in the evening. She will need close follow-up of her renal function following discharge. Follow-up with Dr. Anne Fu following DC. Add hydralazine for reduced LV function and elevated blood pressure and increase isosorbide to 30 mg daily. Avoid ARB given renal insufficiency. [name]

## 2015-07-29 NOTE — Consult Note (Signed)
   [facility] CM Inpatient Consult   07/29/2015  GERLEAN CID June 02, 1946 [id number] Active patient with [facility] Care Management [up to [place].  Met with the patient at bedside.  Patient states she wants to continue services with [place] Management.  She will be followed by her community care nurse and she will also follow up with the Advanced HF clinic.  Spoke with Carole Binning, HF Navigator earlier today.  [facility] Care Management does not interfere with or replace any services needed or arranged by the inpatient Care Management staff.  For questions, please contact: Natividad Brood, RN BSN [facility] Liaison  [phone] business mobile phone

## 2015-07-29 NOTE — Progress Notes (Signed)
  Hypoglycemia Event.  CBG 69. Hypoglycemia protocol initiated. Recheck CBG was 97.

## 2015-07-29 NOTE — Progress Notes (Signed)
[facility] PROGRESS NOTE  [name] ZOX:[id number] DOB: [birth year] DOA: 07/27/2015  PCP: Cala Bradford, MD  Brief HPI: 69 year old female with a history of chronic respiratory failure on 4 L at home at nighttime, subglottic stenosis status post tracheostomy, systolic CHF, CKD stage III, and stroke presented with one-day history of increasing shortness of breath. The patient also complained of increasing thick yellow and brown tracheal secretions over a period of 2-3 days. The patient also has noted increasing abdominal girth and has gained 12 pounds over the past month. She recently saw cardiology whom increased her furosemide to 80 mg in the morning, 40 mg in the evening on 07/20/2015. At the time of admission, the patient was noted to be fluid overloaded with pulmonary edema. She was started on intravenous furosemide with good clinical effect.   Past medical history:  Past Medical History  Diagnosis Date  . Diabetes mellitus     on Lantus 30 U   . CAD (coronary artery disease)     a. S/p CABG and stenting b. L&R cath 01/09/15 LM patent, LAD 100% occluded, LCX moderate disease, RCA 100% occluded. SVG-RCA CTO, SVG-mid LAD patent with more distal LAD 70% stenosis. Recs for medical management.   Marland Kitchen PAD (peripheral artery disease) (HCC)   . Stroke [facility])     MRI 11/2011 with remote occipital lobe. MRA with moderate left focal vertebral artery stenosis  . Chronic combined systolic and diastolic CHF (congestive heart failure) (HCC)     a. 11/2011 Echo with EF 30-35%, global hypokinesis, and inferior akinesis;  b. 12/2014 Echo: EF 50%, mod LVH, Ao sclerosis w/o stenosis, mildly dil LA, mild to mod RV dysfxn.  . Hyperlipidemia   . DDD (degenerative disc disease), lumbar   . MI (myocardial infarction) (HCC) 1997  . COPD (chronic obstructive pulmonary disease) (HCC)     a. prn and HS supplemental O2.  . Anemia   . Hypertension   . GERD (gastroesophageal reflux disease)   . History of  IBS   . Hypothyroidism     Goiter  . Carotid artery occlusion   . CKD (chronic kidney disease), stage III     stage 3  . History of tracheostomy (HCC) 08/26/2013    Dr. Jenne Pane  . Shortness of breath dyspnea     Consultants: Cardiology  Procedures:  Echocardiogram Study Conclusions - Left ventricle: The cavity size was normal. There was moderateconcentric hypertrophy. Systolic function was moderately reduced.The estimated ejection fraction was in the range of 35% to 40%.There is akinesis of the basalinferior myocardium. Dopplerparameters are consistent with abnormal left ventricularrelaxation (grade 1 diastolic dysfunction). Doppler parametersare consistent with high ventricular filling pressure. - Ventricular septum: Septal motion showed abnormal function anddyssynergy. - Mitral valve: Moderately calcified annulus. There was mildregurgitation. - Left atrium: The atrium was moderately dilated. Impressions: - EF is decreased when compared to prior echocardiogram however EFis similar to 2014 echocardiogram.  Antibiotics: Was placed on ceftriaxone and azithromycin at the time of admission Changed to Vantin 10/5  Subjective: Patient feels better this morning. However, she has noticed increased cough over the last 12 hours. Brownish expectoration is noted. Denies any chest pain. Shortness of breath improved.  Objective: Vital Signs  Filed Vitals:   07/29/15 0113 07/29/15 0400 07/29/15 0440 07/29/15 0847  BP:   161/66   Pulse:   76 78  Temp:   97.6 F (36.4 C)   TempSrc:   Oral   Resp:   18 18  Height:  Weight:   79.924 kg (176 lb 3.2 oz)   SpO2: 96% 100% 99% 97%    Intake/Output Summary (Last 24 hours) at 07/29/15 0927 Last data filed at 07/29/15 0854  Gross per 24 hour  Intake   1020 ml  Output   1100 ml  Net    -80 ml   Filed Weights   07/28/15 0449 07/29/15 0440  Weight: 81.33 kg (179 lb 4.8 oz) 79.924 kg (176 lb 3.2 oz)    General appearance: alert,  cooperative, appears stated age and no distress Resp: no obvious drainage or discharge from the tracheostomy site. Few crackles bilateral bases. No wheezing. No rhonchi. Cardio: regular rate and rhythm, S1, S2 normal, no murmur, click, rub or gallop GI: soft, non-tender; bowel sounds normal; no masses,  no organomegaly Extremities: extremities normal, atraumatic, no cyanosis or edema Neurologic: Alert and oriented X 3, normal strength and tone.   Lab Results:  Basic Metabolic Panel:  Recent Labs Lab 07/27/15 0744 07/27/15 1211 07/28/15 0512 07/29/15 0338  NA 139  --  135 137  K 3.9  --  4.4 4.6  CL 107  --  100* 98*  CO2 25  --  25 28  GLUCOSE 284*  --  400* 233*  BUN 35*  --  55* 60*  CREATININE 1.22* 1.27* 1.89* 1.85*  CALCIUM 8.6*  --  8.6* 8.6*   Liver Function Tests:  Recent Labs Lab 07/27/15 0744  AST 19  ALT 17  ALKPHOS 104  BILITOT 0.5  PROT 6.8  ALBUMIN 3.4*   CBC:  Recent Labs Lab 07/27/15 0744 07/28/15 0512  WBC 6.0 11.3*  NEUTROABS 4.5  --   HGB 10.9* 11.7*  HCT 34.2* 35.9*  MCV 97.2 95.0  PLT 149* 167   Cardiac Enzymes:  Recent Labs Lab 07/27/15 0744 07/27/15 1211 07/27/15 1650 07/27/15 2340  TROPONINI 0.06* 0.05* 0.05* 0.04*   BNP (last 3 results)  Recent Labs  01/06/15 0520 07/27/15 0744  BNP 1061.8* 852.0*   CBG:  Recent Labs Lab 07/28/15 1124 07/28/15 1654 07/28/15 2132 07/28/15 2141 07/29/15 0612  GLUCAP 259* 211* 211* 191* 270*    Recent Results (from the past 240 hour(s))  Urine culture     Status: None   Collection Time: 07/27/15  4:49 PM  Result Value Ref Range Status   Specimen Description URINE, RANDOM  Final   Special Requests NONE  Final   Culture MULTIPLE SPECIES PRESENT, SUGGEST RECOLLECTION  Final   Report Status 07/28/2015 FINAL  Final      Studies/Results: X-ray Chest Pa And Lateral  07/28/2015   CLINICAL DATA:  Shortness of Breath  EXAM: CHEST - 2 VIEW  COMPARISON:  07/27/2015  FINDINGS:  Tracheostomy tube is again noted in satisfactory position. Postsurgical changes are again seen. Stable cardiomegaly is noted. The degree of interstitial edema has improved significantly in the interval from the previous day. Some minimal lingular infiltrate is again noted. No sizable effusion is seen.  IMPRESSION: Minimal lingular infiltrate.  Significant improvement in interstitial edema from the previous day.   Electronically Signed   By: Alcide Clever M.D.   On: 07/28/2015 09:08    Medications:  Scheduled: . aspirin EC  81 mg Oral Daily  . carvedilol  25 mg Oral BID WC  . cefpodoxime  100 mg Oral Q12H  . clopidogrel  75 mg Oral Daily  . DULoxetine  60 mg Oral Daily  . ferrous sulfate  325 mg Oral Q breakfast  .  furosemide  80 mg Oral BID  . gabapentin  300 mg Oral Daily  . hydrALAZINE  25 mg Oral 3 times per day  . insulin aspart  0-15 Units Subcutaneous TID WC  . insulin aspart  0-5 Units Subcutaneous QHS  . insulin aspart  6 Units Subcutaneous TID WC  . insulin glargine  22 Units Subcutaneous QHS  . ipratropium-albuterol  3 mL Nebulization Q6H  . [START ON 07/30/2015] isosorbide mononitrate  30 mg Oral Daily  . levothyroxine  50 mcg Oral QAC breakfast  . pantoprazole  40 mg Oral BID  . simvastatin  40 mg Oral QPM  . sodium chloride  3 mL Intravenous Q12H  . sodium chloride  3 mL Intravenous Q12H   Continuous:  ZOX:WRUEAV chloride, alum & mag hydroxide-simeth, dicyclomine, glucose, guaiFENesin, hydrALAZINE, ipratropium-albuterol, nitroGLYCERIN, ondansetron **OR** ondansetron (ZOFRAN) IV, senna-docusate, sodium chloride  Assessment/Plan:  Principal Problem:   Acute respiratory failure with hypoxia (HCC) Active Problems:   S/P CABG (coronary artery bypass graft)   Hx-TIA (transient ischemic attack)   Diabetes mellitus type 2, uncontrolled (HCC)   Hypertension   Hyperlipidemia   H/O hyperthyroidism s/p thyroidectomy   Subglottic stenosis w/chronic tracheostomy   Peripheral  vascular disease, unspecified (HCC)   Acute on chronic combined systolic and diastolic congestive heart failure, NYHA class 4 (HCC)   Acute pulmonary edema (HCC)   Dyspnea   Abnormal EKG   Elevated troponin   Acute on chronic combined systolic and diastolic CHF (congestive heart failure) (HCC)   CHF (congestive heart failure) (HCC)   Acute on chronic respiratory failure with hypoxia (HCC)   DM type 2 causing CKD stage 3 (HCC)    Acute on chronic respiratory failure with hypoxia  -Secondary to CHF and tracheobronchitis -stable presently on trach collar -normally 4L at night time at home  Acute on chronic systolic and diastolic CHF  -Dry weight appears to be 165-167  -Patient still appears a little bit on hypervolemic side  -patient appears to be diuresing well on Lasix. Her weight is reduced. -Appreciate cardiology follow-up  -echocardiogram report as above. EF is about 35%. Reduced compared to echocardiogram from March. No ACE inhibitor or ARB due to chronic kidney disease. Hydralazine and nitrates have been initiated.  Acute Tracheobronchitis -patient was started on ceftriaxone and azithromycin. Change to oral Vantin today.  Elevated Troponin/CAD -demand ischemia and CHF exacerbation in setting of CKD -no signs of angina -EKG with nonspecific changes -s/p CABG with most recent cath in 12/2014 showing LM patent, LAD 100% occluded, RCA 100% occluded, SVG-RCA CTO, SVG-mid LAD, distal LAD 70% stenosis. Medical therapy elected at that time.   Essential HTN -continue imdur, coreg  Diabetes mellitus type 2 with renal manifestations - not optimally controlled. Will increase dose of Lantus. -NovoLog sliding scale  -NovoLog 6 units with meals   CKD 3 -Baseline creatinine 1.1-1.4 - some increase in creatinine has been noted. Most likely due to diuresis. -continue to monitor. Some diarrhea of elevation in creatinine to be expected. -holding irbesartan due to increasing  creatinine  DVT Prophylaxis: SCDs   Code Status: DO NOT RESUSCITATE  Family Communication: discussed with the patient  Disposition Plan: anticipate discharge tomorrow     LOS: 2 days   [facility]  [facility] Pager [phone] 07/29/2015, 9:27 AM  If 7PM-7AM, please contact night-coverage at [email or web address], password [facility]

## 2015-07-29 NOTE — Progress Notes (Signed)
Heart Failure Navigator Consult Note  Presentation: [name] is a 69 y.o. 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 She presents to the emergency department today with dyspnea and acute respiratory failure. She reports that her shortness of breath started yesterday. She normally wears 4 L of oxygen at night, but yesterday she wore oxygen all day long and was unable to be without it. She has noticed thick yellow secretions that are streaked with brown from her tracheostomy. She denies any chest pain. She reports palpitations when she coughs. She complains of increased abdominal girth and has gained approximately 12 pounds over the past month. She saw Tereso Newcomer, Cardiology PA on 9/26 and her Lasix dosage was increased to 80 mg in the a.m. and 40 mg in the PM. Her daughter complains that her mother has been more sedentary recently. The patient denies fever, significantly increased cough, abdominal pain, anorexia, dysuria, changes in her bowel habits, myalgia   Past Medical History  Diagnosis Date  . Diabetes mellitus     on Lantus 30 U   . CAD (coronary artery disease)     a. S/p CABG and stenting b. L&R cath 01/09/15 LM patent, LAD 100% occluded, LCX moderate disease, RCA 100% occluded. SVG-RCA CTO, SVG-mid LAD patent with more distal LAD 70% stenosis. Recs for medical management.   Marland Kitchen PAD (peripheral artery disease) (HCC)   . Stroke [facility])     MRI 11/2011 with remote occipital lobe. MRA with moderate left focal vertebral artery stenosis  . Chronic combined systolic and diastolic CHF (congestive heart failure) (HCC)     a. 11/2011 Echo with EF 30-35%, global hypokinesis, and inferior akinesis;  b. 12/2014 Echo: EF 50%, mod LVH, Ao sclerosis w/o stenosis, mildly dil LA, mild to mod RV dysfxn.  . Hyperlipidemia   . DDD  (degenerative disc disease), lumbar   . MI (myocardial infarction) (HCC) 1997  . COPD (chronic obstructive pulmonary disease) (HCC)     a. prn and HS supplemental O2.  . Anemia   . Hypertension   . GERD (gastroesophageal reflux disease)   . History of IBS   . Hypothyroidism     Goiter  . Carotid artery occlusion   . CKD (chronic kidney disease), stage III     stage 3  . History of tracheostomy (HCC) 08/26/2013    Dr. Jenne Pane  . Shortness of breath dyspnea     Social History   Social History  . Marital Status: Single    Spouse Name: N/A  . Number of Children: 3  . Years of Education: N/A   Occupational History  . Retired    Social History Main Topics  . Smoking status: Former Smoker -- 1.00 packs/day for 50 years    Types: Cigarettes    Quit date: 01/24/2011  . Smokeless tobacco: Never Used  . Alcohol Use: No  . Drug Use: No  . Sexual Activity: No   Other Topics Concern  . None   Social History Narrative   Retired Advertising account executive.  Worked at the [employer] on [employer].  Formerly lived in [place] area.  2 daughters, Living here with her daughter in [place] starting end of January 2013. [name] [phone]. Ambulatory previously with a cane, but none now.    ECHO:  Study Conclusions-07/28/15  - Left ventricle: The cavity size was normal. There  was moderate concentric hypertrophy. Systolic function was moderately reduced. The estimated ejection fraction was in the range of 35% to 40%. There is akinesis of the basalinferior myocardium. Doppler parameters are consistent with abnormal left ventricular relaxation (grade 1 diastolic dysfunction). Doppler parameters are consistent with high ventricular filling pressure. - Ventricular septum: Septal motion showed abnormal function and dyssynergy. - Mitral valve: Moderately calcified annulus. There was mild regurgitation. - Left atrium: The atrium was moderately dilated.  Impressions:  -  EF is decreased when compared to prior echocardiogram however EF is similar to 2014 echocardiogram.:  BNP    Component Value Date/Time   BNP 852.0* 07/27/2015 0744    ProBNP    Component Value Date/Time   PROBNP 4146.0* 08/11/2013 0733     Education Assessment and Provision:  Detailed education and instructions provided on heart failure disease management including the following:  Signs and symptoms of Heart Failure When to call the physician Importance of daily weights Low sodium diet Fluid restriction Medication management Anticipated future follow-up appointments  Patient education given on each of the above topics.  Patient acknowledges understanding and acceptance of all instructions.  I spoke with Ms. Stjulien regarding her HF.  She tells me that she has had HF education before and can teach back topics listed above.  She has not been weighing recently because she was gaining weight--and says she "knows that was a mistake".  I encouraged daily weights and related wt gains to signs and symptoms of HF.  She "tries" to eat low sodium and avoid high sodium foods--however she does have a "cheat day" every 2 weeks.  I reinforced the importance of low sodium diet. She denies any issues getting or taking prescribed medications.  She asked pertinent questions related to HF and her diet.  She lives with her daughter here in [place] her daughter works fulltime during the day.    Education Materials:  "Living Better With Heart Failure" Booklet, Daily Weight Tracker Tool   High Risk Criteria for Readmission and/or Poor Patient Outcomes:   EF <30%- No 35-40% with grade 1 dias dys  2 or more admissions in 6 months- 1/6  Difficult social situation- No-lives with daughter  Demonstrates medication noncompliance- no denies   Barriers of Care:  Knowledge and compliance  Discharge Planning:   Plans to return home with her daughter here in [place].  She may benefit from [facility] for  ongoing education, compliance reinforcement and symptom recognition.

## 2015-07-30 ENCOUNTER — Ambulatory Visit: Payer: Medicare Other | Admitting: Family

## 2015-07-30 ENCOUNTER — Encounter (HOSPITAL_COMMUNITY): Payer: Medicare Other

## 2015-07-30 ENCOUNTER — Other Ambulatory Visit (HOSPITAL_COMMUNITY): Payer: Medicare Other

## 2015-07-30 LAB — BASIC METABOLIC PANEL
Anion gap: 10 (ref 5–15)
BUN: 75 mg/dL — ABNORMAL HIGH (ref 6–20)
CALCIUM: 8.7 mg/dL — AB (ref 8.9–10.3)
CO2: 30 mmol/L (ref 22–32)
CREATININE: 2.13 mg/dL — AB (ref 0.44–1.00)
Chloride: 100 mmol/L — ABNORMAL LOW (ref 101–111)
GFR calc non Af Amer: 23 mL/min — ABNORMAL LOW (ref >60)
GFR, EST AFRICAN AMERICAN: 26 mL/min — AB (ref >60)
Glucose, Bld: 191 mg/dL — ABNORMAL HIGH (ref 65–99)
Potassium: 4.7 mmol/L (ref 3.5–5.1)
SODIUM: 140 mmol/L (ref 135–145)

## 2015-07-30 LAB — CBC
HCT: 38.4 % (ref 36.0–46.0)
Hemoglobin: 12.4 g/dL (ref 12.0–15.0)
MCH: 30.9 pg (ref 26.0–34.0)
MCHC: 32.3 g/dL (ref 30.0–36.0)
MCV: 95.8 fL (ref 78.0–100.0)
Platelets: 168 10*3/uL (ref 150–400)
RBC: 4.01 MIL/uL (ref 3.87–5.11)
RDW: 14.4 % (ref 11.5–15.5)
WBC: 8.1 10*3/uL (ref 4.0–10.5)

## 2015-07-30 LAB — GLUCOSE, CAPILLARY
Glucose-Capillary: 180 mg/dL — ABNORMAL HIGH (ref 65–99)
Glucose-Capillary: 61 mg/dL — ABNORMAL LOW (ref 65–99)

## 2015-07-30 MED ORDER — FUROSEMIDE 40 MG PO TABS: 40.0000 mg | ORAL_TABLET | Freq: Two times a day (BID) | ORAL | Status: DC

## 2015-07-30 MED ORDER — HYDRALAZINE HCL 25 MG PO TABS: 25.0000 mg | ORAL_TABLET | Freq: Three times a day (TID) | ORAL | Status: DC

## 2015-07-30 MED ORDER — CEFPODOXIME PROXETIL 100 MG PO TABS: 100.0000 mg | ORAL_TABLET | Freq: Two times a day (BID) | ORAL | Status: DC

## 2015-07-30 MED ORDER — UNABLE TO FIND: Status: DC

## 2015-07-30 MED ORDER — GUAIFENESIN 100 MG/5ML PO SOLN: 5.0000 mL | ORAL | Status: DC | PRN

## 2015-07-30 MED ORDER — ACETAMINOPHEN 325 MG PO TABS
650.0000 mg | ORAL_TABLET | Freq: Four times a day (QID) | ORAL | Status: DC | PRN
  Administered 2015-07-30: 650 mg via ORAL
  Filled 2015-07-30: qty 2

## 2015-07-30 NOTE — Discharge Instructions (Signed)

## 2015-07-30 NOTE — Progress Notes (Signed)
Patient seen for trach team follow up.  No education needed at this time.  All needed equipment at bedside.  Will continue to follow. 

## 2015-07-30 NOTE — Progress Notes (Signed)
Occupational Therapy Treatment Patient Details Name: [name] MRN: [id number] DOB: 1945-11-11 Today's Date: 07/30/2015    History of present illness 69 year old female with past medical history of coronary artery disease status post coronary artery bypass graft, ischemic cardiomyopathy, diabetes mellitus, hypertension, hyperlipidemia, COPD, prior tracheostomy for subglottic stenosis and renal insufficiency who we are asked to evaluate for acute on chronic combined systolic/diastolic congestive heart failure. Also receiving empiric antibiotics for presume bronchitis.    OT comments  Pt making excellent progress with functional goals and d/c home later today per nursing  Follow Up Recommendations  No OT follow up;Supervision - Intermittent    Equipment Recommendations  None recommended by OT    Recommendations for Other Services      Precautions / Restrictions Precautions Precautions: Fall Restrictions Weight Bearing Restrictions: No       Mobility Bed Mobility Overal bed mobility: Independent Bed Mobility: Supine to Sit;Sit to Supine     Supine to sit: Modified independent (Device/Increase time) Sit to supine: Modified independent (Device/Increase time)      Transfers Overall transfer level: Modified independent Equipment used: None Transfers: Sit to/from Stand Sit to Stand: Modified independent (Device/Increase time)              Balance Overall balance assessment: No apparent balance deficits (not formally assessed)                                 ADL       Grooming: Wash/dry hands;Wash/dry face;Standing;Modified independent                   Statistician: RW;Regular Toilet;Grab bars;Modified Independent   Toileting- Clothing Manipulation and Hygiene: Modified independent         General ADL Comments: Pt seated at sink to beign bathing after OT set up, however then declined until her dauhgter arrived with her clothng  for d/c later today      Vision  wears glasses, no change from baseline                              Cognition   Behavior During Therapy: [facility] for tasks assessed/performed Overall Cognitive Status: Within Functional Limits for tasks assessed                       Extremity/Trunk Assessment   WFL                        General Comments  pt pleasant and cooperative    Pertinent Vitals/ Pain       Pain Assessment: No/denies pain  Home Living  with her daughter                                         Prior Functioning/Environment  independent with ADLs and mobility           Frequency Min 2X/week     Progress Toward Goals  OT Goals(current goals can now be found in the care plan section)  Progress towards OT goals: Progressing toward goals     Plan Discharge plan remains appropriate  End of Session     Activity Tolerance Patient tolerated treatment well   Patient Left in bed;with call bell/phone within reach (sitting EOB)             Time: 1610-9604 OT Time Calculation (min): 14 min  Charges: OT General Charges $OT Visit: 1 Procedure OT Treatments $Therapeutic Activity: 8-22 mins  Galen Manila 07/30/2015, 1:12 PM

## 2015-07-30 NOTE — Progress Notes (Signed)
Patient Profile: 69 year old female with past medical history of coronary artery disease status post coronary artery bypass graft, ischemic cardiomyopathy, diabetes mellitus, hypertension, hyperlipidemia, COPD, prior tracheostomy for subglottic stenosis and renal insufficiency who we are asked to evaluate for acute on chronic combined systolic/diastolic congestive heart failure. Also receiving empiric antibiotics for presume bronchitis.   Subjective: Denies dyspnea or chest pain  Objective: Vital signs in last 24 hours: Temp:  [97.5 F (36.4 C)-98 F (36.7 C)] 97.8 F (36.6 C) (10/06 0812) Pulse Rate:  [60-93] 60 (10/06 0816) Resp:  [16-20] 18 (10/06 0816) BP: (106-141)/(55-72) 128/57 mmHg (10/06 0812) SpO2:  [95 %-100 %] 99 % (10/06 0907) FiO2 (%):  [35 %] 35 % (10/06 0619) Weight:  [81.874 kg (180 lb 8 oz)] 81.874 kg (180 lb 8 oz) (10/06 0444) Last BM Date: 07/29/15  Intake/Output from previous day: 10/05 0701 - 10/06 0700 In: 1000 [P.O.:1000] Out: 1401 [Urine:1400; Stool:1] Intake/Output this shift: Total I/O In: 220 [P.O.:220] Out: -   Medications Current Facility-Administered Medications  Medication Dose Route Frequency Provider Last Rate Last Dose  . 0.9 %  sodium chloride infusion  250 mL Intravenous PRN Stephani Police, PA-C      . alum & mag hydroxide-simeth (MAALOX/MYLANTA) 200-200-20 MG/5ML suspension 30 mL  30 mL Oral Q6H PRN Stephani Police, PA-C      . aspirin EC tablet 81 mg  81 mg Oral Daily Stephani Police, PA-C   81 mg at 07/29/15 0831  . carvedilol (COREG) tablet 25 mg  25 mg Oral BID WC Osvaldo Shipper, MD   25 mg at 07/30/15 0800  . cefpodoxime (VANTIN) tablet 100 mg  100 mg Oral Q12H Osvaldo Shipper, MD   100 mg at 07/29/15 2211  . clopidogrel (PLAVIX) tablet 75 mg  75 mg Oral Daily Stephani Police, PA-C   75 mg at 07/29/15 0831  . dicyclomine (BENTYL) tablet 20 mg  20 mg Oral TID PRN Stephani Police, PA-C   20 mg at 07/27/15 2246  . DULoxetine  (CYMBALTA) DR capsule 60 mg  60 mg Oral Daily Stephani Police, PA-C   60 mg at 07/29/15 1610  . ferrous sulfate tablet 325 mg  325 mg Oral Q breakfast Osvaldo Shipper, MD   325 mg at 07/30/15 0800  . gabapentin (NEURONTIN) capsule 300 mg  300 mg Oral Daily Stephani Police, PA-C   300 mg at 07/29/15 0831  . glucose chewable tablet 4 g  1 tablet Oral PRN Stephani Police, PA-C      . guaiFENesin (ROBITUSSIN) 100 MG/5ML solution 100 mg  5 mL Oral Q4H PRN Osvaldo Shipper, MD   100 mg at 07/29/15 2211  . hydrALAZINE (APRESOLINE) injection 10 mg  10 mg Intravenous Q6H PRN Stephani Police, PA-C      . hydrALAZINE (APRESOLINE) tablet 25 mg  25 mg Oral 3 times per day Lewayne Bunting, MD   25 mg at 07/30/15 0616  . insulin aspart (novoLOG) injection 0-15 Units  0-15 Units Subcutaneous TID WC Catarina Hartshorn, MD   3 Units at 07/30/15 [phone]  . insulin aspart (novoLOG) injection 0-5 Units  0-5 Units Subcutaneous QHS Catarina Hartshorn, MD   2 Units at 07/29/15 2212  . insulin aspart (novoLOG) injection 6 Units  6 Units Subcutaneous TID WC Osvaldo Shipper, MD   6 Units at 07/30/15 0800  . insulin glargine (LANTUS) injection 20 Units  20 Units Subcutaneous QHS Osvaldo Shipper, MD  20 Units at 07/29/15 2212  . ipratropium-albuterol (DUONEB) 0.5-2.5 (3) MG/3ML nebulizer solution 3 mL  3 mL Nebulization BID PRN Stephani Police, PA-C      . isosorbide mononitrate (IMDUR) 24 hr tablet 30 mg  30 mg Oral Daily Lewayne Bunting, MD      . levothyroxine (SYNTHROID, LEVOTHROID) tablet 50 mcg  50 mcg Oral QAC breakfast Stephani Police, PA-C   50 mcg at 07/30/15 3295  . nitroGLYCERIN (NITROSTAT) SL tablet 0.4 mg  0.4 mg Sublingual Q5 min PRN Stephani Police, PA-C   0.4 mg at 07/29/15 1425  . ondansetron (ZOFRAN) tablet 4 mg  4 mg Oral Q6H PRN Stephani Police, PA-C       Or  . ondansetron (ZOFRAN) injection 4 mg  4 mg Intravenous Q6H PRN Stephani Police, PA-C      . pantoprazole (PROTONIX) EC tablet 40 mg  40 mg Oral BID Stephani Police, PA-C    40 mg at 07/29/15 2210  . senna-docusate (Senokot-S) tablet 1 tablet  1 tablet Oral QHS PRN Stephani Police, PA-C      . simvastatin (ZOCOR) tablet 40 mg  40 mg Oral QPM Stephani Police, PA-C   40 mg at 07/29/15 1715  . sodium chloride 0.9 % injection 3 mL  3 mL Intravenous Q12H Stephani Police, PA-C   3 mL at 07/29/15 2214  . sodium chloride 0.9 % injection 3 mL  3 mL Intravenous PRN Conley Canal       [facility] Weights   07/28/15 1884 07/29/15 0440 07/30/15 0444  Weight: 81.33 kg (179 lb 4.8 oz) 79.924 kg (176 lb 3.2 oz) 81.874 kg (180 lb 8 oz)   PE: General appearance: alert, cooperative and no distress Neck: supple + trach Lungs: CTA Heart: regular rate and rhythm Abd: soft, not tender, no masses Extremities: no edema Skin: warm and dry Neurologic: Grossly normal  Lab Results:   Recent Labs  07/28/15 0512 07/30/15 0501  WBC 11.3* 8.1  HGB 11.7* 12.4  HCT 35.9* 38.4  PLT 167 168   BMET  Recent Labs  07/28/15 0512 07/29/15 0338 07/30/15 0501  NA 135 137 140  K 4.4 4.6 4.7  CL 100* 98* 100*  CO2 25 28 30   GLUCOSE 400* 233* 191*  BUN 55* 60* 75*  CREATININE 1.89* 1.85* 2.13*  CALCIUM 8.6* 8.6* 8.7*      Assessment/Plan    1. Acute Combined Systolic + Diastolic CHF: patient much improved. She denies dyspnea at present. Her renal function is worse today. Hold Lasix today and resume 80 mg in the morning and 40 mg in the evening tomorrow. She will weigh herself daily and take an additional 40 mg for weight gain of 2 pounds. Previously instructed on low sodium diet and fluid restriction.  2. CAD: s/p CABG with most recent cath in 12/2014 showing LM patent, LAD 100% occluded, RCA 100% occluded, SVG-RCA CTO, SVG-mid LAD, distal LAD 70% stenosis. Medical therapy.  3. ?Acute Bronchitis: empiric treatment with antibiotics per IM.   4. Abnormal Troponin: flat low level trend 0.06, 0.05, 0.04. Not diagnostic of an ACS. No further w/u at this time.   5. Chronic  Renal Insufficieny: Would recheck potassium and renal function on Monday with results to Dr. Anne Fu.  6. HTN: continue present meds  7. HLD: on statin.   8. DM: poorly controlled. Hgb A1c is 9.7. Goal is <7.0. Management per primary.  9.  ICM: continue Coreg and hydralazine/nitrates. Avapro has been discontinued because of renal insufficiency.  Patient can be discharged from a cardiac standpoint. Transition of care follow-up with physician assistant in 1 week and Dr. Anne Fu 8 weeks  Milus Height

## 2015-07-30 NOTE — Progress Notes (Signed)
PT Cancellation Note  Patient Details Name: KIMBA LOTTES MRN: [id number] DOB: 04-Dec-1945   Cancelled Treatment:    Reason Eval/Treat Not Completed: Other (comment) (pt to d/c and nurse giving d/c instructions now.)Declined politely. Thanks.   Tawni Millers F 07/30/2015, 11:27 AM  [facility] [phone] (pager)

## 2015-07-30 NOTE — Care Management Important Message (Signed)
Important Message  Patient Details  Name: [name] MRN: [id number] Date of Birth: October 31, 1945   Medicare Important Message Given:  Yes-second notification given    Orson Aloe 07/30/2015, 12:59 PM

## 2015-07-30 NOTE — Care Management Note (Signed)
Case Management Note  Patient Details  Name: [name] MRN: [id number] Date of Birth: 01/27/1946  Subjective/Objective:  Admitted with Acute Respiratory Failure                  Action/Plan: Patient lives at home with daughter, use the [facility] for transportation services. PCP is Dr Laurann Montana; Dr Jenne Pane ENT. Has private insurance with Medicare / [name] - with prescription drug coverage - no problems getting her medication. [facility]acy of choice is [facility]. Patient has a nurse that checks on her monthly, home 02 through [facility]. DME - has a walker and cane. No needs identified.  Expected Discharge Date:    possibly 07/30/2015               Expected Discharge Plan:  Home/Self Care  Discharge planning Services  CM Consult  Status of Service:  Completed, signed off  Reola Mosher [phone] 07/30/2015, 10:02 AM

## 2015-07-30 NOTE — Trach Care Team (Addendum)
Trach Care Progression Note   Patient Details Name: [name] MRN: [id number] DOB: [birth year] Today's Date: 07/30/2015   Tracheostomy Assessment    Tracheostomy Shiley 6 mm Uncuffed (Active)  Status Secured 07/30/2015 11:45 AM  Site Assessment Dry;Clean 07/30/2015 11:45 AM  Site Care Protective barrier to skin 07/29/2015  8:27 PM  Inner Cannula Care Other (Comment) 07/29/2015  2:30 PM  Ties Assessment Secure 07/30/2015 11:45 AM  Cuff pressure (cm) 0 cm 07/30/2015 11:45 AM  Trach Changed Yes 07/29/2015  4:00 AM  Emergency Equipment at bedside Yes 07/30/2015 11:45 AM     Care Needs  Plan to d/c home, self care   Respiratory Therapy Tracheostomy: Chronic trach O2 Device: Not Delivered FiO2 (%): 35 % SpO2: 99 % Education: Ongoing Who was educated?: Patient Follow up recommendations:  (will follow for progression)    Speech Language Pathology  SLP chart review complete: Patient does not need SLP services at this time   Physical Therapy Ambulation/Gait assistance: Min guard PT Recommendation/Assessment: Patient needs continued PT services Follow Up Recommendations: No PT follow up, Supervision - Intermittent PT equipment: None recommended by PT    Occupational Therapy OT Recommendation/Assessment: Patient needs continued OT Services Follow Up Recommendations: No OT follow up, Supervision - Intermittent OT Equipment: None recommended by OT           Huntington Leverich, Riley Nearing 07/30/2015, 2:23 PM

## 2015-07-30 NOTE — Discharge Summary (Signed)
[facility]  Physician Discharge Summary   Patient ID: [name] MRN: [id number] DOB/AGE: 69-23-1947 69 y.o.  Admit date: 07/27/2015 Discharge date: 07/30/2015  PCP: Cala Bradford, MD  DISCHARGE DIAGNOSES:  Principal Problem:   Acute respiratory failure with hypoxia (HCC) Active Problems:   S/P CABG (coronary artery bypass graft)   Hx-TIA (transient ischemic attack)   Diabetes mellitus type 2, uncontrolled (HCC)   Hypertension   Hyperlipidemia   H/O hyperthyroidism s/p thyroidectomy   Subglottic stenosis w/chronic tracheostomy   Peripheral vascular disease, unspecified (HCC)   Acute on chronic combined systolic and diastolic congestive heart failure, NYHA class 4 (HCC)   Acute pulmonary edema (HCC)   Dyspnea   Abnormal EKG   Elevated troponin   Acute on chronic combined systolic and diastolic CHF (congestive heart failure) (HCC)   CHF (congestive heart failure) (HCC)   Acute on chronic respiratory failure with hypoxia (HCC)   DM type 2 causing CKD stage 3 (HCC)   Tracheitis   RECOMMENDATIONS FOR OUTPATIENT FOLLOW UP: 1. Patient to undergo blood work to check renal function on Monday, October 10   DISCHARGE CONDITION: fair  Diet recommendation: Modified carbohydrate  Filed Weights   07/28/15 0449 07/29/15 0440 07/30/15 0444  Weight: 81.33 kg (179 lb 4.8 oz) 79.924 kg (176 lb 3.2 oz) 81.874 kg (180 lb 8 oz)    INITIAL HISTORY: 69 year old female with a history of chronic respiratory failure on 4 L at home at nighttime, subglottic stenosis status post tracheostomy, systolic CHF, CKD stage III, and stroke presented with one-day history of increasing shortness of breath. The patient also complained of increasing thick yellow and brown tracheal secretions over a period of 2-3 days. The patient also has noted increasing abdominal girth and has gained 12 pounds over the past month. She recently saw cardiology whom increased her furosemide to 80 mg in the  morning, 40 mg in the evening on 07/20/2015. At the time of admission, the patient was noted to be fluid overloaded with pulmonary edema. She was started on intravenous furosemide with good clinical effect.   Consultants: Cardiology  Procedures:  Echocardiogram Study Conclusions - Left ventricle: The cavity size was normal. There was moderateconcentric hypertrophy. Systolic function was moderately reduced.The estimated ejection fraction was in the range of 35% to 40%.There is akinesis of the basalinferior myocardium. Dopplerparameters are consistent with abnormal left ventricularrelaxation (grade 1 diastolic dysfunction). Doppler parametersare consistent with high ventricular filling pressure. - Ventricular septum: Septal motion showed abnormal function anddyssynergy. - Mitral valve: Moderately calcified annulus. There was mildregurgitation. - Left atrium: The atrium was moderately dilated. Impressions: - EF is decreased when compared to prior echocardiogram however EFis similar to 2014 echocardiogram.   HOSPITAL COURSE:   Acute on chronic respiratory failure with hypoxia  This was thought to be secondary to CHF and tracheobronchitis. Patient has improved with treatment. Stable on trach collar. Normally 4L at night time at home  Acute on chronic systolic and diastolic CHF  Patient has diuresed well. She is symptomatically much improved. Examination also shows improvement. Discussed with cardiology today. She will hold her Lasix today and resume from tomorrow. She will need to have renal function. Blood work on Monday. Echocardiogram report as above. EF is about 35%. Reduced compared to echocardiogram from March. No ACE inhibitor or ARB due to chronic kidney disease. Hydralazine and nitrates have been initiated.  Acute Tracheobronchitis Patient was started on ceftriaxone and azithromycin. Changed to oral Vantin.  Elevated Troponin/CAD Demand ischemia and  CHF exacerbation in  setting of CKD. No signs of angina. EKG with nonspecific changes. S/p CABG with most recent cath in 12/2014 showing LM patent, LAD 100% occluded, RCA 100% occluded, SVG-RCA CTO, SVG-mid LAD, distal LAD 70% stenosis. Medical therapy elected at that time.   Essential HTN Blood pressure has been stable. Continue current treatment. ARB has been held due to worsening creatinine.   Diabetes mellitus type 2 with renal manifestations Continue home medication regimen.  CKD 3 Baseline creatinine 1.1-1.4. Sme increase in creatinine has been noted. Most likely due to diuresis. Some degree of elevation in creatinine to be expected. Holding irbesartan due to increasing creatinine. She will have blood work on Monday to check her renal function.  Overall improved. Patient is keen on going home. Okay for discharge.  PERTINENT LABS:  The results of significant diagnostics from this hospitalization (including imaging, microbiology, ancillary and laboratory) are listed below for reference.    Microbiology: Recent Results (from the past 240 hour(s))  Urine culture     Status: None   Collection Time: 07/27/15  4:49 PM  Result Value Ref Range Status   Specimen Description URINE, RANDOM  Final   Special Requests NONE  Final   Culture MULTIPLE SPECIES PRESENT, SUGGEST RECOLLECTION  Final   Report Status 07/28/2015 FINAL  Final     Labs: Basic Metabolic Panel:  Recent Labs Lab 07/27/15 0744 07/27/15 1211 07/28/15 0512 07/29/15 0338 07/30/15 0501  NA 139  --  135 137 140  K 3.9  --  4.4 4.6 4.7  CL 107  --  100* 98* 100*  CO2 25  --  25 28 30   GLUCOSE 284*  --  400* 233* 191*  BUN 35*  --  55* 60* 75*  CREATININE 1.22* 1.27* 1.89* 1.85* 2.13*  CALCIUM 8.6*  --  8.6* 8.6* 8.7*   Liver Function Tests:  Recent Labs Lab 07/27/15 0744  AST 19  ALT 17  ALKPHOS 104  BILITOT 0.5  PROT 6.8  ALBUMIN 3.4*   CBC:  Recent Labs Lab 07/27/15 0744 07/28/15 0512 07/30/15 0501  WBC 6.0 11.3* 8.1   NEUTROABS 4.5  --   --   HGB 10.9* 11.7* 12.4  HCT 34.2* 35.9* 38.4  MCV 97.2 95.0 95.8  PLT 149* 167 168   Cardiac Enzymes:  Recent Labs Lab 07/27/15 0744 07/27/15 1211 07/27/15 1650 07/27/15 2340 07/29/15 1646  TROPONINI 0.06* 0.05* 0.05* 0.04* 0.06*   BNP: BNP (last 3 results)  Recent Labs  01/06/15 0520 07/27/15 0744  BNP 1061.8* 852.0*   CBG:  Recent Labs Lab 07/29/15 1440 07/29/15 1631 07/29/15 2045 07/30/15 0608 07/30/15 1126  GLUCAP 97 167* 208* 180* 61*     IMAGING STUDIES X-ray Chest Pa And Lateral  07/28/2015   CLINICAL DATA:  Shortness of Breath  EXAM: CHEST - 2 VIEW  COMPARISON:  07/27/2015  FINDINGS: Tracheostomy tube is again noted in satisfactory position. Postsurgical changes are again seen. Stable cardiomegaly is noted. The degree of interstitial edema has improved significantly in the interval from the previous day. Some minimal lingular infiltrate is again noted. No sizable effusion is seen.  IMPRESSION: Minimal lingular infiltrate.  Significant improvement in interstitial edema from the previous day.   Electronically Signed   By: Alcide Clever M.D.   On: 07/28/2015 09:08   Dg Chest 2 View  07/27/2015   CLINICAL DATA:  Shortness of breath.  Ex-smoker.  EXAM: CHEST  2 VIEW  COMPARISON:  01/06/2015.  FINDINGS: Stable enlarged cardiac silhouette and diffusely prominent interstitial markings mild diaphragmatic flattening on the lateral view. No pleural fluid seen. Stable post CABG changes. The tracheostomy tube is in satisfactory position. Left neck surgical clips are unchanged.  IMPRESSION: 1. Stable marked cardiomegaly and chronic interstitial lung disease with possible superimposed interstitial edema. 2. No acute abnormality.   Electronically Signed   By: Beckie Salts M.D.   On: 07/27/2015 08:37    DISCHARGE EXAMINATION: Filed Vitals:   07/30/15 0812 07/30/15 0816 07/30/15 0907 07/30/15 1134  BP: 128/57     Pulse: 70 60  64  Temp: 97.8 F (36.6  C)     TempSrc: Oral     Resp:  18  18  Height:      Weight:      SpO2: 97% 98% 99% 99%   General appearance: alert, cooperative, appears stated age and no distress Resp: Improved air entry bilaterally. No wheezing. No crackles. Cardio: regular rate and rhythm, S1, S2 normal, no murmur, click, rub or gallop GI: soft, non-tender; bowel sounds normal; no masses,  no organomegaly Extremities: extremities normal, atraumatic, no cyanosis or edema  DISPOSITION: Home  Discharge Instructions    Call MD for:  difficulty breathing, headache or visual disturbances    Complete by:  As directed      Call MD for:  extreme fatigue    Complete by:  As directed      Call MD for:  persistant dizziness or light-headedness    Complete by:  As directed      Call MD for:  persistant nausea and vomiting    Complete by:  As directed      Call MD for:  severe uncontrolled pain    Complete by:  As directed      Call MD for:  temperature >100.4    Complete by:  As directed      Diet - low sodium heart healthy    Complete by:  As directed      Diet Carb Modified    Complete by:  As directed      Discharge instructions    Complete by:  As directed   Please go for blood work on Monday as discussed. Please take lasix as instructed starting tomorrow.  You were cared for by a hospitalist during your hospital stay. If you have any questions about your discharge medications or the care you received while you were in the hospital after you are discharged, you can call the unit and asked to speak with the hospitalist on call if the hospitalist that took care of you is not available. Once you are discharged, your primary care physician will handle any further medical issues. Please note that NO REFILLS for any discharge medications will be authorized once you are discharged, as it is imperative that you return to your primary care physician (or establish a relationship with a primary care physician if you do not have  one) for your aftercare needs so that they can reassess your need for medications and monitor your lab values. If you do not have a primary care physician, you can call [phone] for a physician referral.     Increase activity slowly    Complete by:  As directed            ALLERGIES:  Allergies  Allergen Reactions  . Aldactone [Spironolactone] Other (See Comments)    Severe hyperkalemia   . Lisinopril Other (See Comments) and Cough  Hypotension also  . Crestor [Rosuvastatin Calcium] Other (See Comments)    Muscle Pain  . Vicodin [Hydrocodone-Acetaminophen] Nausea And Vomiting     Current Discharge Medication List    START taking these medications   Details  cefpodoxime (VANTIN) 100 MG tablet Take 1 tablet (100 mg total) by mouth every 12 (twelve) hours. For 5 more days. Qty: 10 tablet, Refills: 0    guaiFENesin (ROBITUSSIN) 100 MG/5ML SOLN Take 5 mLs (100 mg total) by mouth every 4 (four) hours as needed for cough or to loosen phlegm. Qty: 236 mL, Refills: 0    hydrALAZINE (APRESOLINE) 25 MG tablet Take 1 tablet (25 mg total) by mouth every 8 (eight) hours. Qty: 90 tablet, Refills: 2    UNABLE TO FIND Blood work: Basic metabolic Panel on 08/03/15. Dx. CKD Stage 2. Results to Dr[facility]. Qty: 1 each, Refills: 0      CONTINUE these medications which have CHANGED   Details  furosemide (LASIX) 40 MG tablet Take 1-2 tablets (40-80 mg total) by mouth 2 (two) times daily. Starting 07/31/15: 80 mg in the morning and 40 mg in the afternoon Qty: 20 tablet, Refills: 0      CONTINUE these medications which have NOT CHANGED   Details  aspirin EC 81 MG tablet Take 81 mg by mouth daily.    carvedilol (COREG) 25 MG tablet Take 25 mg by mouth 2 (two) times daily with a meal.    clopidogrel (PLAVIX) 75 MG tablet Take 75 mg by mouth daily.    dicyclomine (BENTYL) 20 MG tablet Take 20 mg by mouth 3 (three) times daily before meals.    DULoxetine (CYMBALTA) 60 MG  capsule Take 60 mg by mouth daily. Refills: 5    ferrous sulfate 325 (65 FE) MG tablet Take 1 tablet (325 mg total) by mouth 2 (two) times daily with a meal. Qty: 60 tablet, Refills: 0    gabapentin (NEURONTIN) 300 MG capsule Take 300 mg by mouth at bedtime.    glucose ([facility] GLUCOSE) 4 GM chewable tablet Chew 1 tablet by mouth as needed for low blood sugar.    Insulin Glargine (TOUJEO SOLOSTAR) 300 UNIT/ML SOPN Inject 16 Units into the skin at bedtime.    ipratropium-albuterol (DUONEB) 0.5-2.5 (3) MG/3ML SOLN Take 3 mLs by nebulization 2 (two) times daily as needed (for shortnes of breath).     isosorbide mononitrate (IMDUR) 30 MG 24 hr tablet Take 1 tablet (30 mg total) by mouth daily. Qty: 30 tablet, Refills: 5   Associated Diagnoses: Chronic combined systolic and diastolic CHF (congestive heart failure) (HCC)    lansoprazole (PREVACID) 15 MG capsule Take 15 mg by mouth daily at 12 noon.    levothyroxine (SYNTHROID, LEVOTHROID) 50 MCG tablet Take 50 mcg by mouth daily before breakfast.  Refills: 6    Multiple Vitamins-Minerals (WOMENS 50+ MULTI VITAMIN/MIN) TABS Take 1 tablet by mouth daily.    nitroGLYCERIN (NITROSTAT) 0.4 MG SL tablet Place 0.4 mg under the tongue every 5 (five) minutes as needed. For chest pain    OXYGEN-HELIUM IN Inhale 4 L into the lungs at bedtime.     Probiotic Product (PROBIOTIC PO) Take 1 tablet by mouth 2 (two) times daily.    simvastatin (ZOCOR) 40 MG tablet Take 40 mg by mouth every evening.       STOP taking these medications     irbesartan (AVAPRO) 300 MG tablet        Follow-up Information  Follow up with Donato Schultz, MD On 08/11/2015.   Specialty:  Cardiology   Why:  ;00am   Contact information:   [address] [place] [address] [phone]       TOTAL DISCHARGE TIME: 35 minutes  [facility]  [facility] Pager [phone]  07/30/2015, 1:38 PM

## 2015-07-30 NOTE — Progress Notes (Signed)
Discharge instructions completed.  Pt understands medication regimen.  

## 2015-07-30 NOTE — Progress Notes (Signed)
Results for EIKO, MCGOWEN (MRN [id number]) as of 07/30/2015 12:05  Ref. Range 07/29/2015 14:40 07/29/2015 16:31 07/29/2015 20:45 07/30/2015 06:08 07/30/2015 11:26  Glucose-Capillary Latest Ref Range: 65-99 mg/dL 97 409 (H) 811 (H) 914 (H) 61 (L)  Noted that CBG was 61 mg/dl. Recommend decreasing Novolog meal coverage to 3-4 units TID if CBGs continue to be less than 100 mg/dl. Will continue to monitor blood sugars. Smith Mince RN BSN CDE

## 2015-07-31 ENCOUNTER — Other Ambulatory Visit: Payer: Self-pay | Admitting: *Deleted

## 2015-07-31 NOTE — Patient Outreach (Signed)
Member hospitalized on Monday 10/3 with heart failure exacerbation.  She was discharged yesterday, 10/6.  Call placed to member to follow up on hospitalization and to start the transition of care program.  No answer at home number [phone]), HIPPA compliant voice message placed.  Member's cell number [phone]) has been changed or disconnected.  Home visit already scheduled for Monday.  Will continue with home visit on Monday.  Kemper Durie, BSN, [facility] Care Management  [facility] Care Manager [phone]

## 2015-08-03 ENCOUNTER — Ambulatory Visit: Payer: Self-pay | Admitting: *Deleted

## 2015-08-03 ENCOUNTER — Other Ambulatory Visit: Payer: Self-pay

## 2015-08-03 NOTE — Patient Outreach (Signed)
[facility]) Care Management  08/03/2015  TIFFANY CALMES 1946/05/23 [id number]   Assessment: 69 year old recent admission 10/3-10/6 with heart failure.Transition of care call completed. Member denies any shortness of breath. Member has scales and weighs self daily. Ms. Kunsman reports 2 pound weight decrease. Member states she is on a low salt diet, is watching her fluid intake and has been walking 15 minutes/day. Member report she did not have lab work drawn on 10/10. RNCM encouraged member to have blood work drawn. Member reports she will go tomorrow to have blood work drawn. Member also does not have a follow up appointment with her primary care. RNCM offered to scheduled appointment, but member stated she would call herself. RNCM contact information provided. Signs/symptoms of heart failure exacerbation reinforced. On call-RNCM's contact number provided and encouraged to call this week if questions or problems.     Plan: Assigned RNCM will follow up with member next week.  Kathyrn Sheriff, RN, MSN, [facility] Community Care Coordinator Cell: [phone]

## 2015-08-10 NOTE — Progress Notes (Addendum)
Cardiology Office Note   Date:  08/11/2015   ID:  SHAUNTAVIA BRACKIN, DOB 20-May-1946, MRN [id number]  PCP:  Cala Bradford, MD  Cardiologist:  Dr. Donato Schultz   Electrophysiologist:  n/a  Chief Complaint  Patient presents with  . Hospitalization Follow-up  . Congestive Heart Failure     History of Present Illness: [name] is a 69 y.o. female with a hx of CAD status post CABG, ischemic cardiomyopathy, chronic systolic CHF, COPD, diabetes, PAD, HL, CKD stage III, HTN. EF has been as low as 20% in the past. Most recent echo in 3/16 demonstrated EF 50%. LHC in 3/16 demonstrated patent left main, occluded LAD and RCA, nonobstructive disease in the LCx, occluded SVG-RCA, patent SVG to the LAD with 70% stenosis beyond graft. There were no good target for PCI medical therapy was recommended. She is on chronic O2 secondary to COPD and systolic HF. She is status post left LE bypass by Dr. Darrick Penna. 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   Last seen 07/20/15.  Her weight was up 12 lbs and I increased her Lasix for volume excess.  She was then admitted 10/3-10/6 acute on chronic hypoxic respiratory failure in the setting of acute on chronic combined systolic and diastolic CHF and tracheobronchitis. She was treated with a combination of IV diuretics and antibiotics. Troponin levels were minimally elevated without clear trend. She was seen by cardiology.  Returns for follow-up.  Since discharge, she has been doing well. Her weight prior to admission was 188 pounds at home. Today she is 177. There has been no essential change in her weight since discharge from the hospital. She remains short of breath with exertion. She is chronically NYHA 2b. She denies orthopnea or significant pedal edema. She denies abdominal distention. She denies chest discomfort. She denies syncope. She denies significant change in coughing or wheezing.   Studies/Reports Reviewed Today:  Echo  07/28/15 Moderate LVH, EF 35-40%, inferior AK, grade 1 diastolic dysfunction, MAC, mild MR, moderate LAE  Carotid US 4/16 R 40-59%; L CEA patent with 40-59%  Myoview 3/16 Final Impression: High risk nuclear stress test. LVEF 25%, dilated LV with severe global hypokinesis and anteroapical akinesis. There is a moderate-sized and intensity, partially reversible anterior perfusion defect which could represent scar with peri-infarct ischemia or significant hybernating myocardium.  LHC 3/16 EF 45%, inf HK LM:  Ok LAD:  Ostial 100% LCx:  Ostial 40%, branch vessel 50% (not amenable to PCI) RCA:  prox 100% after RV, 90% prior to RV branch, RV branch with 90% S-RCA:  Chronically occluded S-mLAD:  Patent, inferoapical LAD beyond graft 70% POST-OPERATIVE DIAGNOSIS:   Severe Native & graft CAD with 100% RCA, LAD & small AV Groove Cx along with SVG-RCA  Widely patent SVG-distal Mid Wraparound LAD with severe diffuse proximal LAD disease limiting retrograde flow.  Widely patent native Ramus vs. Large Lateral OM with 2 large bifurcating branches that have diffuse moderate disease.  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  Echo 3/16 Inf-lat HK, inf HK, Mod LVH, EF 50%, Ao sclerosis, mild LAE, mild to mod reduced RVSF,  mild RAE, PASP 35 mmHg    Past Medical History  Diagnosis Date  . Diabetes mellitus     on Lantus 30 U   . CAD (coronary artery disease)     a. S/p CABG and stenting b. L&R cath 01/09/15 LM patent, LAD 100% occluded, LCX moderate disease, RCA 100% occluded. SVG-RCA CTO, SVG-mid LAD patent with more distal LAD 70%  stenosis. Recs for medical management.   Marland Kitchen PAD (peripheral artery disease) (HCC)   . Stroke [facility])     MRI 11/2011 with remote occipital lobe. MRA with moderate left focal vertebral artery stenosis  . Chronic combined systolic and diastolic CHF (congestive heart failure) (HCC)     a. 11/2011 Echo with EF 30-35%, global hypokinesis, and inferior akinesis;  b. 12/2014 Echo: EF 50%, mod LVH, Ao sclerosis w/o stenosis, mildly dil LA, mild to mod RV dysfxn.  . Hyperlipidemia   . DDD (degenerative disc disease), lumbar   . MI (myocardial infarction) (HCC) 1997  . COPD (chronic obstructive pulmonary disease) (HCC)     a. prn and HS supplemental O2.  . Anemia   . Hypertension   . GERD (gastroesophageal reflux disease)   . History of IBS   . Hypothyroidism     Goiter  . Carotid artery occlusion   . CKD (chronic kidney disease), stage III     stage 3  . History of tracheostomy (HCC) 08/26/2013    Dr. Jenne Pane  . Shortness of breath dyspnea     Past Surgical History  Procedure Laterality Date  . Ptca    . Thyroidectomy    . Coronary artery bypass graft      2 vessel  . Carotid endarterectomy  ~2008    Left   . Cholecystectomy    . Tracheostomy tube placement  01/02/2012  . Angioplasty  [id number]    Aortogram by Dr. Italy McKenzie [facility])  . Pr vein bypass graft,aorto-fem-pop      Right common femoral-AK popliteal BPG & Right Popliteal-posterior tibial  . Pr vein bypass graft,aorto-fem-pop      Left Fem-pop BPG  . Carpal tunnel release Right   . Femoral-tibial bypass graft Left 06/11/2013    Procedure: BYPASS GRAFT  LEFT FEMORAL- POSTERIOR TIBIAL ARTERY/ REDO;  Surgeon: Sherren Kerns, MD;  Location: [facility] OR;  Service: Vascular;  Laterality: Left;  . Thrombectomy femoral artery Left 06/11/2013    Procedure: THROMBECTOMY FEMORAL ARTERY;  Surgeon: Sherren Kerns, MD;  Location: [facility] OR;  Service: Vascular;  Laterality: Left;  . Spine surgery  Oct. 27, 2014    Injection - Back  .  Abdominal aortagram N/A 04/05/2013    Procedure: ABDOMINAL Ronny Flurry;  Surgeon: Sherren Kerns, MD;  Location: [facility] CATH LAB;  Service: Cardiovascular;  Laterality: N/A;  . Colonoscopy N/A 10/27/2014    Procedure: COLONOSCOPY;  Surgeon: Willis Modena, MD;  Location: [facility] ENDOSCOPY;  Service: Endoscopy;  Laterality: N/A;  . Left and right heart catheterization with coronary angiogram N/A 01/09/2015    Procedure: LEFT AND RIGHT HEART CATHETERIZATION WITH CORONARY ANGIOGRAM;  Surgeon: Marykay Lex, MD;  Location: [facility] CATH LAB;  Service: Cardiovascular;  Laterality: N/A;     Current Outpatient Prescriptions  Medication Sig Dispense Refill  . aspirin EC 81 MG tablet Take 81 mg by mouth daily.    . carvedilol (COREG) 25 MG tablet Take 25 mg by mouth 2 (two) times daily with a meal.    . cefpodoxime (VANTIN) 100 MG tablet Take  1 tablet (100 mg total) by mouth every 12 (twelve) hours. For 5 more days. 10 tablet 0  . clopidogrel (PLAVIX) 75 MG tablet Take 75 mg by mouth daily.    Marland Kitchen. dicyclomine (BENTYL) 20 MG tablet Take 20 mg by mouth 3 (three) times daily before meals.    . DULoxetine (CYMBALTA) 60 MG capsule Take 60 mg by mouth daily.  5  . ferrous sulfate 325 (65 FE) MG tablet Take 1 tablet (325 mg total) by mouth 2 (two) times daily with a meal. (Patient taking differently: Take 325 mg by mouth daily. ) 60 tablet 0  . furosemide (LASIX) 40 MG tablet Take 1-2 tablets (40-80 mg total) by mouth 2 (two) times daily. Starting 07/31/15: 80 mg in the morning and 40 mg in the afternoon 20 tablet 0  . gabapentin (NEURONTIN) 300 MG capsule Take 300 mg by mouth at bedtime.    Marland Kitchen. glucose ([employer] GLUCOSE) 4 GM chewable tablet Chew 1 tablet by mouth as needed for low blood sugar.    Marland Kitchen. guaiFENesin (ROBITUSSIN) 100 MG/5ML SOLN Take 5 mLs (100 mg total) by mouth every 4 (four) hours as needed for cough or to loosen phlegm. 236 mL 0  . hydrALAZINE (APRESOLINE) 25 MG tablet Take 1 tablet (25 mg total) by mouth every 8 (eight)  hours. 90 tablet 2  . Insulin Glargine (TOUJEO SOLOSTAR) 300 UNIT/ML SOPN Inject 16 Units into the skin at bedtime. (Patient taking differently: Inject 68 Units into the skin at bedtime. )    . ipratropium-albuterol (DUONEB) 0.5-2.5 (3) MG/3ML SOLN Take 3 mLs by nebulization 2 (two) times daily as needed (for shortnes of breath).     . isosorbide mononitrate (IMDUR) 30 MG 24 hr tablet Take 1 tablet (30 mg total) by mouth daily. (Patient taking differently: Take 15 mg by mouth daily. ) 30 tablet 5  . lansoprazole (PREVACID) 15 MG capsule Take 15 mg by mouth daily at 12 noon.    . lansoprazole (PREVACID) 30 MG capsule TAKE 1 CAPSULE TWICE DAILY.  5  . levothyroxine (SYNTHROID, LEVOTHROID) 50 MCG tablet Take 50 mcg by mouth daily before breakfast.   6  . Multiple Vitamins-Minerals (WOMENS 50+ MULTI VITAMIN/MIN) TABS Take 1 tablet by mouth daily.    . nitroGLYCERIN (NITROSTAT) 0.4 MG SL tablet Place 0.4 mg under the tongue every 5 (five) minutes as needed. For chest pain    . OXYGEN-HELIUM IN Inhale 4 L into the lungs at bedtime.     . Probiotic Product (PROBIOTIC PO) Take 1 tablet by mouth 2 (two) times daily.    . simvastatin (ZOCOR) 40 MG tablet Take 40 mg by mouth every evening.     Marland Kitchen. UNABLE TO FIND Blood work: Basic metabolic Panel on 08/03/15. Dx. CKD Stage 2. Results to Dr. Anne FuSkains with [facility]. 1 each 0  . [DISCONTINUED] dexlansoprazole (DEXILANT) 60 MG capsule Take 60 mg by mouth daily.     No current facility-administered medications for this visit.    Allergies:   Aldactone; Lisinopril; Crestor; and Vicodin    Social History:  The patient  reports that she quit smoking about 4 years ago. Her smoking use included Cigarettes. She has a 50 pack-year smoking history. She has never used smokeless tobacco. She reports that she does not drink alcohol or use illicit drugs.   Family History:  The patient's family history includes Alzheimer's disease in her mother; Diabetes in her daughter;  Heart disease in her mother; Hyperlipidemia in her  mother; Hypertension in her daughter; Irregular heart beat in her mother; Other in her mother.    ROS:   Please see the history of present illness.   Review of Systems  Constitution: Positive for malaise/fatigue and weight gain.  Eyes: Positive for visual disturbance.  Cardiovascular: Positive for dyspnea on exertion.  Musculoskeletal: Positive for joint pain and myalgias.  Psychiatric/Behavioral: Positive for depression.  All other systems reviewed and are negative.     PHYSICAL EXAM: VS:  BP 108/60 mmHg  Pulse 66  Ht  (1.575 m)  Wt 181 lb (82.101 kg)  BMI 33.10 kg/m2    Wt Readings from Last 3 Encounters:  08/11/15 181 lb (82.101 kg)  07/30/15 180 lb 8 oz (81.874 kg)  07/20/15 184 lb (83.462 kg)     GEN: Well nourished, well developed, in no acute distress HEENT: normal Neck:  Trach collar in place, difficult to assess JVD but appears ok, no masses Cardiac:  Normal S1/S2, RRR; no murmur ,  no rubs or gallops, trace bilateral LE edema  L > R Respiratory:  Decreased breath sounds bilaterally, crackles at the bases bilaterally  GI: soft, nontender, nondistended, + BS MS: no deformity or atrophy Skin: warm and dry  Neuro:  CNs II-XII intact, Strength and sensation are intact Psych: Normal affect   EKG:  EKG is ordered today.  It demonstrates:   NSR, HR 66, normal axis, TWI in 1, aVL, septal Q waves, QTc 486 ms, 1st degree AVB (PR 250 ms), no change from prior tracing.    Recent Labs: 01/13/2015: Magnesium 2.6* 07/27/2015: ALT 17; B Natriuretic Peptide 852.0* 07/30/2015: BUN 75*; Creatinine, Ser 2.13*; Hemoglobin 12.4; Platelets 168; Potassium 4.7; Sodium 140    Lipid Panel    Component Value Date/Time   CHOL 134 01/07/2015 0543   TRIG 122 01/07/2015 0543   HDL 36* 01/07/2015 0543   CHOLHDL 3.7 01/07/2015 0543   VLDL 24 01/07/2015 0543   LDLCALC 74 01/07/2015 0543      ASSESSMENT AND PLAN:  1. Chronic  Combined Systolic and Diastolic CHF:  Status post recent admission to the hospital with volume excess in the setting of acute tracheobronchitis. Weights since discharge from the hospital have been stable. She is feeling better with less abdominal distention and improved breathing. Volume appears stable on exam. Check follow-up BMET today.    2. Ischemic Cardiomyopathy:   Recent EF 35-40 %.  Continue beta-blocker, hydralazine, nitrates.  ARB has been DC'd due to worsening renal function.    3. CAD S/P CABG:  Status post myocardial infarction in 1997 and subsequent CABG. Cardiac catheterization 3/16 with severe native three-vessel disease, occluded SVG-RCA and patent SVG-LAD. Anatomy was felt to be stable and medical therapy was continued.  She denies anginal symptoms.  Continue ASA, Plavix, statin, beta-blocker. She would like to start an exercise program and is interested in cardiac rehabilitation. I will refer her.  4. Subglottis Stenosis Status Post Tracheostomy: Followed by Dr. Jenne Pane.  5. HTN:  Controlled.   6. Hyperlipidemia:  Continue statin.   7. COPD: On home O2.  FU with Pulmonology as directed.  8. PAD: Followed by Dr. Darrick Penna.  9. CKD:  Creatinine was up to 2.13 at DC from the hospital recently.  Repeat BMET today.     Medication Changes: Current medicines are reviewed at length with the patient today.  Concerns regarding medicines are as outlined above.  The following changes have been made:   Discontinued Medications  No medications on file   Modified Medications   No medications on file   New Prescriptions   No medications on file   Labs/ tests ordered today include:   Orders Placed This Encounter  Procedures  . Basic Metabolic Panel (BMET)  . EKG 12-Lead     Disposition:    FU with me 1 month.  FU with Dr. Donato Schultz 3 mos.    Signed, Brynda Rim, MHS 08/11/2015 2:05 PM    [facility] [address] [place], [place], [place]   [address] Phone: [phone]; Fax: [phone]

## 2015-08-11 ENCOUNTER — Ambulatory Visit (INDEPENDENT_AMBULATORY_CARE_PROVIDER_SITE_OTHER): Payer: Medicare Other | Admitting: Physician Assistant

## 2015-08-11 ENCOUNTER — Encounter: Payer: Self-pay | Admitting: Physician Assistant

## 2015-08-11 VITALS — BP 108/60 | HR 66 | Ht 62.0 in | Wt 181.0 lb

## 2015-08-11 DIAGNOSIS — I5043 Acute on chronic combined systolic (congestive) and diastolic (congestive) heart failure: Secondary | ICD-10-CM | POA: Diagnosis not present

## 2015-08-11 DIAGNOSIS — I259 Chronic ischemic heart disease, unspecified: Secondary | ICD-10-CM

## 2015-08-11 DIAGNOSIS — I2510 Atherosclerotic heart disease of native coronary artery without angina pectoris: Secondary | ICD-10-CM | POA: Diagnosis not present

## 2015-08-11 DIAGNOSIS — N189 Chronic kidney disease, unspecified: Secondary | ICD-10-CM

## 2015-08-11 DIAGNOSIS — I255 Ischemic cardiomyopathy: Secondary | ICD-10-CM

## 2015-08-11 DIAGNOSIS — I10 Essential (primary) hypertension: Secondary | ICD-10-CM

## 2015-08-11 DIAGNOSIS — I5042 Chronic combined systolic (congestive) and diastolic (congestive) heart failure: Secondary | ICD-10-CM

## 2015-08-11 DIAGNOSIS — J449 Chronic obstructive pulmonary disease, unspecified: Secondary | ICD-10-CM

## 2015-08-11 DIAGNOSIS — Z9889 Other specified postprocedural states: Secondary | ICD-10-CM

## 2015-08-11 DIAGNOSIS — E785 Hyperlipidemia, unspecified: Secondary | ICD-10-CM

## 2015-08-11 LAB — BASIC METABOLIC PANEL
BUN: 69 mg/dL — AB (ref 7–25)
CO2: 25 mmol/L (ref 20–31)
Calcium: 8.9 mg/dL (ref 8.6–10.4)
Chloride: 109 mmol/L (ref 98–110)
Creat: 1.88 mg/dL — ABNORMAL HIGH (ref 0.50–0.99)
Glucose, Bld: 180 mg/dL — ABNORMAL HIGH (ref 65–99)
POTASSIUM: 5.5 mmol/L — AB (ref 3.5–5.3)
Sodium: 141 mmol/L (ref 135–146)

## 2015-08-11 NOTE — Patient Instructions (Signed)
Medication Instructions:  Your physician recommends that you continue on your current medications as directed. Please refer to the Current Medication list given to you today.   Labwork: TODAY BMET  Testing/Procedures: NONE  Follow-Up: 1. 09/11/15 @ 11:30 SCOTT WEAVER, PAC   2. 11/11/2015 @ 10:45 DR. SKAINS  Any Other Special Instructions Will Be Listed Below (If Applicable). YOU ARE BEING REFERRED TO CARDIAC REHAB AT [place]

## 2015-08-12 ENCOUNTER — Encounter: Payer: Self-pay | Admitting: *Deleted

## 2015-08-12 ENCOUNTER — Telehealth: Payer: Self-pay | Admitting: *Deleted

## 2015-08-12 NOTE — Telephone Encounter (Signed)
Pt notified of lab results and findings by phone with verbal understanding. pt agreeable to plan of care, bmet 10/27. Pt advised to limit dietary K+, went over what types of food s to limit.

## 2015-08-12 NOTE — Telephone Encounter (Signed)
Opened in error  This encounter was created in error - please disregard. 

## 2015-08-13 ENCOUNTER — Other Ambulatory Visit: Payer: Self-pay | Admitting: *Deleted

## 2015-08-13 NOTE — Patient Outreach (Signed)
Weekly transition of care call placed to member.  She states that she is doing good, "I'm keeping my weight down."  She reports taking all medications as prescribed without problems.  She is able to verbalize her heart failure action plan, and denies any concerns at this time.  Home visit scheduled for next month, will continue with transition of care calls next week.  Kemper DurieMonica Siedah Sedor, BSN, [facility] Care Management  [facility] Care Manager [phone]

## 2015-08-19 ENCOUNTER — Ambulatory Visit: Payer: Medicare Other | Admitting: Physician Assistant

## 2015-08-20 ENCOUNTER — Other Ambulatory Visit: Payer: Medicare Other

## 2015-08-21 ENCOUNTER — Other Ambulatory Visit: Payer: Self-pay | Admitting: *Deleted

## 2015-08-21 ENCOUNTER — Telehealth: Payer: Self-pay | Admitting: Podiatry

## 2015-08-21 ENCOUNTER — Other Ambulatory Visit (INDEPENDENT_AMBULATORY_CARE_PROVIDER_SITE_OTHER): Payer: Medicare Other

## 2015-08-21 DIAGNOSIS — I5023 Acute on chronic systolic (congestive) heart failure: Secondary | ICD-10-CM

## 2015-08-21 LAB — BASIC METABOLIC PANEL
BUN: 56 mg/dL — AB (ref 7–25)
CALCIUM: 8.8 mg/dL (ref 8.6–10.4)
CO2: 23 mmol/L (ref 20–31)
Chloride: 105 mmol/L (ref 98–110)
Creat: 1.75 mg/dL — ABNORMAL HIGH (ref 0.50–0.99)
GLUCOSE: 300 mg/dL — AB (ref 65–99)
POTASSIUM: 5.3 mmol/L (ref 3.5–5.3)
Sodium: 139 mmol/L (ref 135–146)

## 2015-08-21 NOTE — Telephone Encounter (Signed)
Left voicemail for pt. To call to r/s appt. Office closing early on 11/23.

## 2015-08-21 NOTE — Patient Outreach (Signed)
Weekly transition of care call placed to member, no answer. HIPPA compliant voice message left.  Will await call back and continue with weekly calls next week.  Kemper DurieMonica Verley Pariseau, BSN, [facility] Care Management  [facility] Care Manager [phone]

## 2015-08-25 ENCOUNTER — Telehealth: Payer: Self-pay | Admitting: *Deleted

## 2015-08-25 NOTE — Telephone Encounter (Signed)
Pt notified of lab results and findings by phone with verbal understanding.  

## 2015-08-28 ENCOUNTER — Other Ambulatory Visit: Payer: Self-pay | Admitting: *Deleted

## 2015-08-28 ENCOUNTER — Ambulatory Visit: Payer: Self-pay | Admitting: *Deleted

## 2015-08-28 NOTE — Patient Outreach (Signed)
Weekly transition of care call placed to member.  She states that she is "doing good."  She reports that her weight is coming down with an increased dose of Lasix, and denies any shortness of breath or discomfort.  She states that her blood sugars have been "good" but states that her record is in the other room and she is unable to get it at this time.  She denies any concerns at this time regarding her blood sugar or her heart failure.  Routine home visit confirmed for next week.  Encouraged to contact this care manager if she has any questions.  Kemper DurieMonica Jamonta Goerner, BSN, [facility] Care Management  [facility] Care Manager [phone]

## 2015-09-01 ENCOUNTER — Encounter: Payer: Self-pay | Admitting: *Deleted

## 2015-09-01 ENCOUNTER — Other Ambulatory Visit: Payer: Self-pay | Admitting: *Deleted

## 2015-09-01 NOTE — Patient Outreach (Signed)
[facility]) Care Management   09/01/2015  [name] June 28, 1946 [id number]  [name] is an 69 y.o. female  Subjective:   Member reports that she has been "doign wonderful."  She denies any shortness of breath or chest discomfort. She does complain of some pain in her back, but state that she has not taken anything for the pain yet this morning.  Objective:   Review of Systems  Constitutional: Negative.   HENT: Negative.   Eyes: Negative.   Respiratory: Positive for cough.        Reports always having a cough in the morning, remains with trach   Cardiovascular: Negative.   Gastrointestinal: Negative.   Genitourinary: Negative.   Musculoskeletal: Negative.   Skin: Negative.   Neurological: Negative.   Endo/Heme/Allergies: Negative.   Psychiatric/Behavioral: Negative.     Physical Exam  Constitutional: She is oriented to person, place, and time. She appears well-developed and well-nourished.  Neck: Normal range of motion.  Cardiovascular: Normal rate, regular rhythm and normal heart sounds.   Respiratory: Effort normal and breath sounds normal.  GI: Soft. Bowel sounds are normal.  Musculoskeletal: Normal range of motion.  Neurological: She is alert and oriented to person, place, and time.  Skin: Skin is warm and dry.   BP 120/68 mmHg  Pulse 68  Resp 20  Wt 177 lb (80.287 kg)  SpO2 96%  Current Medications:   Current Outpatient Prescriptions  Medication Sig Dispense Refill  . aspirin EC 81 MG tablet Take 81 mg by mouth daily.    . carvedilol (COREG) 25 MG tablet Take 25 mg by mouth 2 (two) times daily with a meal.    . clopidogrel (PLAVIX) 75 MG tablet Take 75 mg by mouth daily.    Marland Kitchen dicyclomine (BENTYL) 20 MG tablet Take 20 mg by mouth 3 (three) times daily before meals.    . DULoxetine (CYMBALTA) 60 MG capsule Take 60 mg by mouth daily.  5  . ferrous sulfate 325 (65 FE) MG tablet Take 1 tablet (325 mg total) by mouth 2 (two) times  daily with a meal. (Patient taking differently: Take 325 mg by mouth daily. ) 60 tablet 0  . furosemide (LASIX) 40 MG tablet Take 1-2 tablets (40-80 mg total) by mouth 2 (two) times daily. Starting 07/31/15: 80 mg in the morning and 40 mg in the afternoon 20 tablet 0  . gabapentin (NEURONTIN) 300 MG capsule Take 300 mg by mouth at bedtime.    Marland Kitchen glucose ([employer] GLUCOSE) 4 GM chewable tablet Chew 1 tablet by mouth as needed for low blood sugar.    . hydrALAZINE (APRESOLINE) 25 MG tablet Take 1 tablet (25 mg total) by mouth every 8 (eight) hours. 90 tablet 2  . insulin aspart (NOVOLOG) 100 UNIT/ML injection Inject 12-16 Units into the skin 3 (three) times daily with meals.    . Insulin Glargine (TOUJEO SOLOSTAR) 300 UNIT/ML SOPN Inject 16 Units into the skin at bedtime. (Patient taking differently: Inject 68 Units into the skin at bedtime. )    . ipratropium-albuterol (DUONEB) 0.5-2.5 (3) MG/3ML SOLN Take 3 mLs by nebulization 2 (two) times daily as needed (for shortnes of breath).     . isosorbide mononitrate (IMDUR) 30 MG 24 hr tablet Take 1 tablet (30 mg total) by mouth daily. (Patient taking differently: Take 15 mg by mouth daily. ) 30 tablet 5  . levothyroxine (SYNTHROID, LEVOTHROID) 50 MCG tablet Take 50 mcg by mouth daily before breakfast.  6  . Multiple Vitamins-Minerals (WOMENS 50+ MULTI VITAMIN/MIN) TABS Take 1 tablet by mouth daily.    . nitroGLYCERIN (NITROSTAT) 0.4 MG SL tablet Place 0.4 mg under the tongue every 5 (five) minutes as needed. For chest pain    . OXYGEN-HELIUM IN Inhale 4 L into the lungs at bedtime.     . Probiotic Product (PROBIOTIC PO) Take 1 tablet by mouth 2 (two) times daily.    . simvastatin (ZOCOR) 40 MG tablet Take 40 mg by mouth every evening.     . cefpodoxime (VANTIN) 100 MG tablet Take 1 tablet (100 mg total) by mouth every 12 (twelve) hours. For 5 more days. (Patient not taking: Reported on 09/01/2015) 10 tablet 0  . guaiFENesin (ROBITUSSIN) 100 MG/5ML SOLN Take 5  mLs (100 mg total) by mouth every 4 (four) hours as needed for cough or to loosen phlegm. (Patient not taking: Reported on 09/01/2015) 236 mL 0  . lansoprazole (PREVACID) 15 MG capsule Take 15 mg by mouth daily at 12 noon.    . lansoprazole (PREVACID) 30 MG capsule TAKE 1 CAPSULE TWICE DAILY.  5  . UNABLE TO FIND Blood work: Basic metabolic Panel on 27/03/50. Dx. CKD Stage 2. Results to Dr. [name] [facility]. (Patient not taking: Reported on 09/01/2015) 1 each 0  . [DISCONTINUED] dexlansoprazole (DEXILANT) 60 MG capsule Take 60 mg by mouth daily.     No current facility-administered medications for this visit.    Functional Status:   In your present state of health, do you have any difficulty performing the following activities: 07/28/2015 04/06/2015  Hearing? N -  Vision? N -  Difficulty concentrating or making decisions? N -  Walking or climbing stairs? N -  Dressing or bathing? N -  Doing errands, shopping? N -  Conservation officer, nature and eating ? - N  Using the Toilet? - N  In the past six months, have you accidently leaked urine? - Y  Do you have problems with loss of bowel control? - Y  Managing your Medications? - N  Managing your Finances? - N  Housekeeping or managing your Housekeeping? - Y    Fall/Depression Screening:    PHQ 2/9 Scores 05/07/2015 04/06/2015 03/09/2015 01/28/2015  PHQ - 2 Score 0 1 0 0    Assessment:    Arrived at home for routine home visit.  Member made aware that [name] from [facility] was to accompany this care manager on visit, however had a scheduling conflict.  Member states that she has had a home visit with [name] last week, and that she has rescheduled to visit again on tomorrow.  Member reports that she has been taking all of her medications without problems, and has continued to monitor her weights and blood sugars daily, recording them and assessing the trends for management.  Medications reviewed, member denies any questions regarding  administration.  She remains able to verbalize the heart failure zones and when to contact her physician.  She had a follow up appointment with her cardiologist last month, and has had her Lasix increased since discharge from the hospital.  She reports that she has been on a 1200 ml fluid restriction diet, and adheres to the restriction.  She also states that she makes every attempt to adhere to her diabetic diet in effort to control her blood sugar and decrease her A1C.  She states that she has a diabetes book that teaches about carbohydrate counting and reading food labels.  Nutrition labels  on food reviewed with member, with emphasis on serving sizes.  Member verbalizes understanding.    Member states that she continues to have some abdominal distention, unsure if it is related to fluid buildup.  She reports that she last saw her gastroenterologist on 10/21 and is in the process of more labs and stool testing.  She denies abdominal pain.  She reports that her PCP recommended her for the cardiac rehab program in effort to become more active, and for overall cardiac benefits.  She states that she is awaiting approval from her insurance.  Member made aware of the program at the [facility] is she is not approved for cardiac rehab.  She does express interest, will recommend if not approved.  Member reports that her teeth were lost during her hospitalization.  She states that she has notified the hospital, but has not received a call back.  Instructed to make another attempt to contact hospital leadership, particularly ones from that specific department.  Member denies any further concerns at this time.  Encouraged to contact this care manager with any questions.  Plan:   Will follow up with member in 2 weeks regarding approval for Cardiac Rehab and further needs. Will contact hospital regarding member's lost teeth.  [facility] CM Care Plan Problem One        Most Recent Value   Care Plan Problem One  Elevated  A1C (9.1)   Role Documenting the Problem One  Care Management Coordinator   Care Plan for Problem One  Active   [facility]N Long Term Goal (31-90 days)  Member's A1C will be below target range within the next 90 days   [facility] Long Term Goal Start Date  09/01/15 [Goal not met, reset]   Interventions for Problem One Long Term Goal  Discussed the importance of taking medications as prescribed and following suggested diet   [facility] CM Short Term Goal #1 (0-30 days)  Member will report an increase in her activity to help with managing her diabetes within the next 4 weeks   [facility] CM Short Term Goal #1 Start Date  09/01/15   Interventions for Short Term Goal #1  Discussed the role that exercise plays in managing diabetes   [facility]N CM Short Term Goal #2 (0-30 days)  Member will verbalize examples of carbohydrate counting for diet control within the next 4 weeks   THN CM Short Term Goal #2 Start Date  09/01/15   Interventions for Short Term Goal #2  Discussed appropriate serving sizes, assisted member with reading nutrition labels    [facility] CM Care Plan Problem Two        Most Recent Value   Care Plan Problem Two  Medication Adherence   Role Documenting the Problem Two  Care Management Coordinator   Care Plan for Problem Two  Not Active   Interventions for Problem Two Long Term Goal   Instructions provided for Insulin titrations according to physician   THN Long Term Goal (31-90) days  Member will report taking insulin and adjusting dose as prescribed over the next 45 days, keeping blood sugar within given range   THN Long Term Goal Start Date  07/06/15   [facility] Long Term Goal Met Date  09/01/15   THN CM Short Term Goal #1 (0-30 days)  Member will titrate insulin (Toujeo) so that fasting blood sugar remain within the given range over the next 4 weeks   [facility] CM Short Term Goal #1 Start Date  07/06/15 Maylon Peppers continues  to have inconsistencies, date reset]   [facility] CM Short Term Goal #1 Met Date   09/01/15   Interventions for Short  Term Goal #2   Discussed with member thie importance of keeping correct insulin on hand, and not running out     Valente David, BSN, [place] Manager [phone]

## 2015-09-03 ENCOUNTER — Ambulatory Visit (HOSPITAL_COMMUNITY): Payer: Medicare Other

## 2015-09-04 ENCOUNTER — Telehealth (HOSPITAL_COMMUNITY): Payer: Self-pay

## 2015-09-04 NOTE — Telephone Encounter (Signed)
I have called and left a message with Nicole CellaDorothy to inquire about participation in Pulmonary Rehab per Dr. Anne FuSkains referral. Will send letter in mail and follow up.

## 2015-09-10 NOTE — Progress Notes (Signed)
Cardiology Office Note   Date:  09/11/2015   ID:  [name], DOB Jan 04, 1946, MRN [id number]  PCP:  Cala Bradford, MD  Cardiologist:  Dr. Donato Schultz   Electrophysiologist:  n/a  Chief Complaint  Patient presents with  . Follow-up  . Congestive Heart Failure     History of Present Illness: [name] is a 69 y.o. female with a hx of CAD status post CABG, ischemic cardiomyopathy, chronic systolic CHF, COPD, diabetes, PAD, HL, CKD stage III, HTN. EF has been as low as 20% in the past. Most recent echo in 3/16 demonstrated EF 50%. LHC in 3/16 demonstrated patent left main, occluded LAD and RCA, nonobstructive disease in the LCx, occluded SVG-RCA, patent SVG to the LAD with 70% stenosis beyond graft. There were no good target for PCI and medical therapy was recommended. She is on chronic O2 secondary to COPD and systolic HF. She is status post left LE bypass by Dr. Darrick Penna. 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   Admitted 10/16 with a/c hypoxic respiratory failure in the setting of a/c combined systolic and diastolic HF and tracheobronchitis. She was treated with a combination of IV diuretics and antibiotics. Troponin levels were minimally elevated without clear trend. She was seen by cardiology.  Seen in follow-up 08/11/15. She was overall stable. She returns for close follow-up.  She is followed by [facility] and. She was also seen by the nurse practitioner with [facility] (a service of [facility] - Dorian Pod, NP).  She is doing well. She denies chest discomfort. Breathing is stable. Denies orthopnea or PND. Denies syncope. Weights have remained stable at home. Abdominal distention is reduced.   Studies/Reports Reviewed Today:  Echo 07/28/15 Moderate LVH, EF 35-40%, inferior AK, grade 1 diastolic dysfunction, MAC, mild MR, moderate LAE  Carotid US 4/16 R 40-59%; L CEA patent with 40-59%  Myoview 3/16 Final  Impression: High risk nuclear stress test. LVEF 25%, dilated LV with severe global hypokinesis and anteroapical akinesis. There is a moderate-sized and intensity, partially reversible anterior perfusion defect which could represent scar with peri-infarct ischemia or significant hybernating myocardium.  LHC 3/16 EF 45%, inf HK LM:  Ok LAD:  Ostial 100% LCx:  Ostial 40%, branch vessel 50% (not amenable to PCI) RCA:  prox 100% after RV, 90% prior to RV branch, RV branch with 90% S-RCA:  Chronically occluded S-mLAD:  Patent, inferoapical LAD beyond graft 70% POST-OPERATIVE DIAGNOSIS:   Severe Native & graft CAD with 100% RCA, LAD & small AV Groove Cx along with SVG-RCA  Widely patent SVG-distal Mid Wraparound LAD with severe diffuse proximal LAD disease limiting retrograde flow.  Widely patent native Ramus vs. Large Lateral OM with 2 large bifurcating branches that have diffuse moderate disease.  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  Echo 3/16 Inf-lat HK, inf HK, Mod LVH, EF 50%, Ao sclerosis, mild LAE, mild to mod reduced RVSF, mild RAE, PASP 35 mmHg    Past Medical History  Diagnosis Date  . Diabetes mellitus     on Lantus 30 U   . CAD (coronary  artery disease)     a. S/p CABG and stenting b. L&R cath 01/09/15 LM patent, LAD 100% occluded, LCX moderate disease, RCA 100% occluded. SVG-RCA CTO, SVG-mid LAD patent with more distal LAD 70% stenosis. Recs for medical management.   Marland Kitchen PAD (peripheral artery disease) (HCC)   . Stroke [facility])     MRI 11/2011 with remote occipital lobe. MRA with moderate left focal  vertebral artery stenosis  . Chronic combined systolic and diastolic CHF (congestive heart failure) (HCC)     a. 11/2011 Echo with EF 30-35%, global hypokinesis, and inferior akinesis;  b. 12/2014 Echo: EF 50%, mod LVH, Ao sclerosis w/o stenosis, mildly dil LA, mild to mod RV dysfxn.  . Hyperlipidemia   . DDD (degenerative disc disease), lumbar   . MI (myocardial infarction) (HCC) 1997  . COPD (chronic obstructive pulmonary disease) (HCC)     a. prn and HS supplemental O2.  . Anemia   . Hypertension   . GERD (gastroesophageal reflux disease)   . History of IBS   . Hypothyroidism     Goiter  . Carotid artery occlusion   . CKD (chronic kidney disease), stage III     stage 3  . History of tracheostomy (HCC) 08/26/2013    Dr. Jenne Pane  . Shortness of breath dyspnea     Past Surgical History  Procedure Laterality Date  . Ptca    . Thyroidectomy    . Coronary artery bypass graft      2 vessel  . Carotid endarterectomy  ~2008    Left   . Cholecystectomy    . Tracheostomy tube placement  01/02/2012  . Angioplasty  [id number]    Aortogram by Dr. [place] McKenzie [facility])  . Pr vein bypass graft,aorto-fem-pop      Right common femoral-AK popliteal BPG & Right Popliteal-posterior tibial  . Pr vein bypass graft,aorto-fem-pop      Left Fem-pop BPG  . Carpal tunnel release Right   . Femoral-tibial bypass graft Left 06/11/2013    Procedure: BYPASS GRAFT  LEFT FEMORAL- POSTERIOR TIBIAL ARTERY/ REDO;  Surgeon: Sherren Kerns, MD;  Location: [facility] OR;  Service: Vascular;  Laterality: Left;  . Thrombectomy femoral artery Left 06/11/2013    Procedure: THROMBECTOMY FEMORAL ARTERY;  Surgeon: Sherren Kerns, MD;  Location: [facility] OR;  Service: Vascular;  Laterality: Left;  . Spine surgery  Oct. 27, 2014    Injection - Back  . Abdominal aortagram N/A 04/05/2013    Procedure: ABDOMINAL Ronny Flurry;  Surgeon: Sherren Kerns, MD;  Location: [facility] CATH LAB;  Service: Cardiovascular;  Laterality: N/A;  .  Colonoscopy N/A 10/27/2014    Procedure: COLONOSCOPY;  Surgeon: Willis Modena, MD;  Location: [facility] ENDOSCOPY;  Service: Endoscopy;  Laterality: N/A;  . Left and right heart catheterization with coronary angiogram N/A 01/09/2015    Procedure: LEFT AND RIGHT HEART CATHETERIZATION WITH CORONARY ANGIOGRAM;  Surgeon: Marykay Lex, MD;  Location: [facility] CATH LAB;  Service: Cardiovascular;  Laterality: N/A;     Current Outpatient Prescriptions  Medication Sig Dispense Refill  . aspirin EC 81 MG tablet Take 81 mg by mouth daily.    . carvedilol (COREG) 25 MG tablet Take 25 mg by mouth 2 (two) times daily with a meal.    . clopidogrel (PLAVIX) 75 MG tablet Take 75 mg by mouth daily.    Marland Kitchen dicyclomine (BENTYL) 20 MG tablet Take 20 mg by mouth 3 (three) times daily before meals.    . DULoxetine (CYMBALTA)  60 MG capsule Take 60 mg by mouth daily.  5  . ferrous sulfate 325 (65 FE) MG tablet Take 1 tablet (325 mg total) by mouth 2 (two) times daily with a meal. (Patient taking differently: Take 325 mg by mouth daily. ) 60 tablet 0  . furosemide (LASIX) 40 MG tablet Take 2 tablets (80 mg total) by mouth as directed. Starting 07/31/15: 80 mg in the morning and 40 mg in the afternoon 90 tablet 11  . gabapentin (NEURONTIN) 300 MG capsule Take 300 mg by mouth at bedtime.    Marland Kitchen glucose ([employer] GLUCOSE) 4 GM chewable tablet Chew 1 tablet by mouth as needed for low blood sugar.    Marland Kitchen guaiFENesin (ROBITUSSIN) 100 MG/5ML SOLN Take 5 mLs (100 mg total) by mouth every 4 (four) hours as needed for cough or to loosen phlegm. 236 mL 0  . hydrALAZINE (APRESOLINE) 25 MG tablet Take 1 tablet (25 mg total) by mouth every 8 (eight) hours. 90 tablet 2  . insulin aspart (NOVOLOG) 100 UNIT/ML injection Inject 12-16 Units into the skin 3 (three) times daily with meals.    . Insulin Glargine (TOUJEO SOLOSTAR) 300 UNIT/ML SOPN Inject 16 Units into the skin at bedtime. (Patient taking differently: Inject 68 Units into the skin at bedtime. )    .  ipratropium-albuterol (DUONEB) 0.5-2.5 (3) MG/3ML SOLN Take 3 mLs by nebulization 2 (two) times daily as needed (for shortnes of breath).     . isosorbide mononitrate (IMDUR) 30 MG 24 hr tablet Take 1 tablet (30 mg total) by mouth daily. (Patient taking differently: Take 15 mg by mouth daily. ) 30 tablet 5  . lansoprazole (PREVACID) 30 MG capsule TAKE 1 CAPSULE TWICE DAILY.  5  . levothyroxine (SYNTHROID, LEVOTHROID) 50 MCG tablet Take 50 mcg by mouth daily before breakfast.   6  . Multiple Vitamins-Minerals (WOMENS 50+ MULTI VITAMIN/MIN) TABS Take 1 tablet by mouth daily.    . nitroGLYCERIN (NITROSTAT) 0.4 MG SL tablet Place 0.4 mg under the tongue every 5 (five) minutes as needed. For chest pain    . OXYGEN-HELIUM IN Inhale 4 L into the lungs at bedtime.     . Probiotic Product (PROBIOTIC PO) Take 1 tablet by mouth 2 (two) times daily.    . simvastatin (ZOCOR) 40 MG tablet Take 40 mg by mouth every evening.     . traMADol (ULTRAM) 50 MG tablet Take 50 mg by mouth every 8 (eight) hours as needed.  0  . UNABLE TO FIND Blood work: Basic metabolic Panel on 08/03/15. Dx. CKD Stage 2. Results to Dr. Anne [facility] with [facility]. 1 each 0  . [DISCONTINUED] dexlansoprazole (DEXILANT) 60 MG capsule Take 60 mg by mouth daily.     No current facility-administered medications for this visit.    Allergies:   Aldactone; Lisinopril; Crestor; and Vicodin    Social History:  The patient  reports that she quit smoking about 4 years ago. Her smoking use included Cigarettes. She has a 50 pack-year smoking history. She has never used smokeless tobacco. She reports that she does not drink alcohol or use illicit drugs.   Family History:  The patient's family history includes Alzheimer's disease in her mother; Diabetes in her daughter; Heart disease in her mother; Hyperlipidemia in her mother; Hypertension in her daughter; Irregular heart beat in her mother; Other in her mother.    ROS:   Please see the history of  present illness.   Review of Systems  Constitution:  Positive for weight loss.  Respiratory: Positive for cough.   Musculoskeletal: Positive for back pain and joint pain.  Psychiatric/Behavioral: The patient is nervous/anxious.   All other systems reviewed and are negative.     PHYSICAL EXAM: VS:  BP 178/78 mmHg  Pulse 68  Ht  (1.575 m)  Wt 176 lb 1.9 oz (79.888 kg)  BMI 32.20 kg/m2    Wt Readings from Last 3 Encounters:  09/11/15 176 lb 1.9 oz (79.888 kg)  09/01/15 177 lb (80.287 kg)  08/11/15 181 lb (82.101 kg)     GEN: Well nourished, well developed, in no acute distress HEENT: normal Neck:  Trach collar in place, difficult to assess JVD but appears ok, no masses Cardiac:  Normal S1/S2, RRR; no murmur ,  no rubs or gallops, no edema Respiratory:  Decreased breath sounds bilaterally, crackles at the bases bilaterally  GI: soft, nontender, nondistended, + BS MS: no deformity or atrophy Skin: warm and dry  Neuro:  CNs II-XII intact, Strength and sensation are intact Psych: Normal affect   EKG:  EKG is not ordered today.  It demonstrates:   N/a    Recent Labs: 01/13/2015: Magnesium 2.6* 07/27/2015: ALT 17; B Natriuretic Peptide 852.0* 07/30/2015: Hemoglobin 12.4; Platelets 168 08/21/2015: BUN 56*; Creat 1.75*; Potassium 5.3; Sodium 139    Lipid Panel    Component Value Date/Time   CHOL 134 01/07/2015 0543   TRIG 122 01/07/2015 0543   HDL 36* 01/07/2015 0543   CHOLHDL 3.7 01/07/2015 0543   VLDL 24 01/07/2015 0543   LDLCALC 74 01/07/2015 0543      ASSESSMENT AND PLAN:  1. Chronic Combined Systolic and Diastolic CHF:  Weights remain stable. She is feeling better with less abdominal distention and improved breathing. Volume appears stable on exam. Recent creatinine stable.   2. Ischemic Cardiomyopathy:   Recent EF 35-40 %.  Continue beta-blocker, hydralazine, nitrates.  ARB has been DC'd due to worsening renal function.    3. CAD S/P CABG:  Status post  myocardial infarction in 1997 and subsequent CABG. Cardiac catheterization 3/16 with severe native three-vessel disease, occluded SVG-RCA and patent SVG-LAD. Anatomy was felt to be stable and medical therapy was continued.  She denies anginal symptoms.  Continue ASA, Plavix, statin, beta-blocker. She would like to start an exercise program and is interested in cardiac rehabilitation. However, her insurance will not cover this. She is trying to get into a water exercise program.   4. Subglottis Stenosis Status Post Tracheostomy: Followed by Dr. Jenne Pane.  5. HTN:  Blood pressure elevated today. However, she has not taken any medications. Blood pressures are typically well controlled.   6. Hyperlipidemia:  Continue statin.   7. COPD: On home O2.  FU with Pulmonology as directed.  8. PAD: Followed by Dr. Darrick Penna.  9. CKD:  Recent creatinines have been stable.    Medication Changes: Current medicines are reviewed at length with the patient today.  Concerns regarding medicines are as outlined above.  The following changes have been made:   Discontinued Medications   CEFPODOXIME (VANTIN) 100 MG TABLET    Take 1 tablet (100 mg total) by mouth every 12 (twelve) hours. For 5 more days.   IRBESARTAN (AVAPRO) 300 MG TABLET    1 TABLET ONCE A DAY ORALLY 30 DAYS   LANSOPRAZOLE (PREVACID) 15 MG CAPSULE    Take 15 mg by mouth daily at 12 noon.   Modified Medications   Modified Medication Previous Medication   FUROSEMIDE (  LASIX) 40 MG TABLET furosemide (LASIX) 40 MG tablet      Take 2 tablets (80 mg total) by mouth as directed. Starting 07/31/15: 80 mg in the morning and 40 mg in the afternoon    Take 1-2 tablets (40-80 mg total) by mouth 2 (two) times daily. Starting 07/31/15: 80 mg in the morning and 40 mg in the afternoon   New Prescriptions   No medications on file   Labs/ tests ordered today include:   No orders of the defined types were placed in this encounter.     Disposition:    FU with Dr.  Donato SchultzMark Skains 10/2015.    Signed, Brynda RimScott Olubunmi Rothenberger, PA-C, MHS 09/11/2015 12:12 PM    [facility] [address], [place], [place]  [address] Phone: [phone]; Fax: [phone]) [phone]

## 2015-09-11 ENCOUNTER — Encounter: Payer: Self-pay | Admitting: Physician Assistant

## 2015-09-11 ENCOUNTER — Ambulatory Visit (INDEPENDENT_AMBULATORY_CARE_PROVIDER_SITE_OTHER): Payer: Medicare Other | Admitting: Physician Assistant

## 2015-09-11 VITALS — BP 178/78 | HR 68 | Ht 62.0 in | Wt 176.1 lb

## 2015-09-11 DIAGNOSIS — I10 Essential (primary) hypertension: Secondary | ICD-10-CM

## 2015-09-11 DIAGNOSIS — I259 Chronic ischemic heart disease, unspecified: Secondary | ICD-10-CM

## 2015-09-11 DIAGNOSIS — Z9889 Other specified postprocedural states: Secondary | ICD-10-CM | POA: Diagnosis not present

## 2015-09-11 DIAGNOSIS — I255 Ischemic cardiomyopathy: Secondary | ICD-10-CM

## 2015-09-11 DIAGNOSIS — N183 Chronic kidney disease, stage 3 (moderate): Secondary | ICD-10-CM

## 2015-09-11 DIAGNOSIS — I5042 Chronic combined systolic (congestive) and diastolic (congestive) heart failure: Secondary | ICD-10-CM | POA: Diagnosis not present

## 2015-09-11 DIAGNOSIS — I2510 Atherosclerotic heart disease of native coronary artery without angina pectoris: Secondary | ICD-10-CM | POA: Diagnosis not present

## 2015-09-11 DIAGNOSIS — E785 Hyperlipidemia, unspecified: Secondary | ICD-10-CM

## 2015-09-11 DIAGNOSIS — J449 Chronic obstructive pulmonary disease, unspecified: Secondary | ICD-10-CM

## 2015-09-11 MED ORDER — FUROSEMIDE 40 MG PO TABS: 80.0000 mg | ORAL_TABLET | ORAL | Status: DC

## 2015-09-11 NOTE — Patient Instructions (Signed)
Medication Instructions:  A REFILL WAS SENT IN FOR LASIX 80 MG IN THE AM AND 40 MG IN THE PM # 90 X 11  Labwork: NONE  Testing/Procedures: NONE  Follow-Up: KEEP YOUR APPT 10/2015 WITH DR. Anne FuSKAINS  Any Other Special Instructions Will Be Listed Below (If Applicable).     If you need a refill on your cardiac medications before your next appointment, please call your pharmacy.

## 2015-09-16 ENCOUNTER — Encounter: Payer: Self-pay | Admitting: Podiatry

## 2015-09-16 ENCOUNTER — Ambulatory Visit (INDEPENDENT_AMBULATORY_CARE_PROVIDER_SITE_OTHER): Payer: Medicare Other | Admitting: Podiatry

## 2015-09-16 ENCOUNTER — Ambulatory Visit: Payer: Medicare Other | Admitting: Podiatry

## 2015-09-16 DIAGNOSIS — B351 Tinea unguium: Secondary | ICD-10-CM | POA: Diagnosis not present

## 2015-09-16 DIAGNOSIS — M79676 Pain in unspecified toe(s): Secondary | ICD-10-CM | POA: Diagnosis not present

## 2015-09-16 NOTE — Patient Instructions (Signed)
Diabetes and Foot Care Diabetes may cause you to have problems because of poor blood supply (circulation) to your feet and legs. This may cause the skin on your feet to become thinner, break easier, and heal more slowly. Your skin may become dry, and the skin may peel and crack. You may also have nerve damage in your legs and feet causing decreased feeling in them. You may not notice minor injuries to your feet that could lead to infections or more serious problems. Taking care of your feet is one of the most important things you can do for yourself.  HOME CARE INSTRUCTIONS  Wear shoes at all times, even in the house. Do not go barefoot. Bare feet are easily injured.  Check your feet daily for blisters, cuts, and redness. If you cannot see the bottom of your feet, use a mirror or ask someone for help.  Wash your feet with warm water (do not use hot water) and mild soap. Then pat your feet and the areas between your toes until they are completely dry. Do not soak your feet as this can dry your skin.  Apply a moisturizing lotion or petroleum jelly (that does not contain alcohol and is unscented) to the skin on your feet and to dry, brittle toenails. Do not apply lotion between your toes.  Trim your toenails straight across. Do not dig under them or around the cuticle. File the edges of your nails with an emery board or nail file.  Do not cut corns or calluses or try to remove them with medicine.  Wear clean socks or stockings every day. Make sure they are not too tight. Do not wear knee-high stockings since they may decrease blood flow to your legs.  Wear shoes that fit properly and have enough cushioning. To break in new shoes, wear them for just a few hours a day. This prevents you from injuring your feet. Always look in your shoes before you put them on to be sure there are no objects inside.  Do not cross your legs. This may decrease the blood flow to your feet.  If you find a minor scrape,  cut, or break in the skin on your feet, keep it and the skin around it clean and dry. These areas may be cleansed with mild soap and water. Do not cleanse the area with peroxide, alcohol, or iodine.  When you remove an adhesive bandage, be sure not to damage the skin around it.  If you have a wound, look at it several times a day to make sure it is healing.  Do not use heating pads or hot water bottles. They may burn your skin. If you have lost feeling in your feet or legs, you may not know it is happening until it is too late.  Make sure your health care provider performs a complete foot exam at least annually or more often if you have foot problems. Report any cuts, sores, or bruises to your health care provider immediately. SEEK MEDICAL CARE IF:   You have an injury that is not healing.  You have cuts or breaks in the skin.  You have an ingrown nail.  You notice redness on your legs or feet.  You feel burning or tingling in your legs or feet.  You have pain or cramps in your legs and feet.  Your legs or feet are numb.  Your feet always feel cold. SEEK IMMEDIATE MEDICAL CARE IF:   There is increasing redness,   swelling, or pain in or around a wound.  There is a red line that goes up your leg.  Pus is coming from a wound.  You develop a fever or as directed by your health care provider.  You notice a bad smell coming from an ulcer or wound.   This information is not intended to replace advice given to you by your health care provider. Make sure you discuss any questions you have with your health care provider.   Document Released: 10/07/2000 Document Revised: 06/12/2013 Document Reviewed: 03/19/2013 Elsevier Interactive Patient Education 2016 Elsevier Inc.  

## 2015-09-17 NOTE — Progress Notes (Signed)
Patient ID: [name], female   DOB: 02/02/1946, 69 y.o.   MRN: [id number]  Subjective: This patient presents again today complaining of painful toenails when wearing shoes and walking and request debridement of toenails  Objective: No open skin lesions bilaterally Toenails 2-5 bilaterally are elongated, hypertrophic, discolored and tender to direct palpation 8  Symptomatic onychomycoses 8 Type II diabetic History of peripheral neuropathy History of peripheral arterial disease  Plan: Debridement toenails 8 mechanically an electrical without any bleeding  Reappoint 3 months

## 2015-09-21 ENCOUNTER — Telehealth (HOSPITAL_COMMUNITY): Payer: Self-pay

## 2015-09-21 NOTE — Telephone Encounter (Signed)
Called patient regarding entrance to Pulmonary Rehab.  Patient states that they are interested in attending the program.  Nicole CellaDorothy is going to check with her son to see if he can help her financially with her 20% co-insurance and follow up.

## 2015-10-02 ENCOUNTER — Encounter: Payer: Self-pay | Admitting: Family

## 2015-10-02 ENCOUNTER — Other Ambulatory Visit: Payer: Self-pay | Admitting: *Deleted

## 2015-10-02 DIAGNOSIS — I5041 Acute combined systolic (congestive) and diastolic (congestive) heart failure: Secondary | ICD-10-CM

## 2015-10-02 DIAGNOSIS — I10 Essential (primary) hypertension: Secondary | ICD-10-CM

## 2015-10-02 DIAGNOSIS — E1169 Type 2 diabetes mellitus with other specified complication: Secondary | ICD-10-CM

## 2015-10-02 NOTE — Patient Outreach (Signed)
Call placed to member to follow up on enrollment with pulmonary rehab.  Member states that she was accepted into the program, but will need to pay over $500 before she is able to start, which she can not afford.  [facility], however that particular location is almost 25 minutes from her home.  Member encouraged to use [facility] at [facility] [employer] [facility], and to increase mobility slowly.  She states that she will look into it.  Member states that her blood sugars, weights and blood pressure has been stable, and that she continues to monitor herself daily.  Discussed with member the possibility of having a [facility] Health Coach provide ongoing education on diabetes and heart failure.  She agrees.  This care manager will now close case and consult the health coach.  Member is instructed to notify health coach if she feels she need to restart home visits.  Kemper DurieMonica Syrena Burges, BSN, [facility] Care Management  [facility] Care Manager [phone]

## 2015-10-08 ENCOUNTER — Encounter (HOSPITAL_COMMUNITY): Payer: Medicare Other

## 2015-10-08 ENCOUNTER — Ambulatory Visit: Payer: Medicare Other | Admitting: Family

## 2015-10-08 ENCOUNTER — Other Ambulatory Visit (HOSPITAL_COMMUNITY): Payer: Medicare Other

## 2015-10-09 ENCOUNTER — Encounter: Payer: Self-pay | Admitting: Family

## 2015-10-13 ENCOUNTER — Other Ambulatory Visit: Payer: Self-pay | Admitting: *Deleted

## 2015-10-13 ENCOUNTER — Telehealth: Payer: Self-pay | Admitting: Physician Assistant

## 2015-10-13 NOTE — Telephone Encounter (Signed)
Spoke with Tammy at [facility] who saw pt today. Pt's blood pressure was elevated as outlined below.  No change in BP during visit. Per Tammy the medical director at palliative care recommends increasing hydralazine to 50 mg every eight hours but wanted to make sure this was OK with cardiology.  If OK palliative care will order.  Tammy reports pt also with edema in left foot and ankle. Also with crackles and shortness of breath that is greater than her baseline.  Per their protocol pt was given extra dose of AM lasix.  Tammy will see pt again tomorrow.   Will forward to Tereso NewcomerScott Weaver, [place] for review.

## 2015-10-13 NOTE — Telephone Encounter (Signed)
I will route this call to the Triage Nurses for further evaluation.

## 2015-10-13 NOTE — Telephone Encounter (Signed)
Ok to increase Hydralazine to 50 mg Three times a day  She is tenuous with CHF. The chart indicates that she takes Lasix 80 A and 40 P.  I would recommend she take 80 mg Twice daily for the next 3 days (then resume regular dose) with a FU BMET and BNP Friday AM (call results to the office). Make sure she is getting weighed daily - ask if her weight is up any. Call back tomorrow with an update. Tereso NewcomerScott Weaver, PA-C   10/13/2015 4:48 PM

## 2015-10-13 NOTE — Telephone Encounter (Signed)
Spoke with Tammy and gave her instructions from [place] Weaver, [place]

## 2015-10-13 NOTE — Telephone Encounter (Signed)
New message      Pt was seen today.  She was hypertensive today---204/104.  Their doctor want to double up on her hydralazine.  Nurse will visit her again tomorrow.  She had fluid buildup in her ankle and crackle sounds in her lungs.  Want to let you know and see if you agree

## 2015-10-14 ENCOUNTER — Ambulatory Visit: Payer: Medicare Other | Admitting: *Deleted

## 2015-10-14 ENCOUNTER — Other Ambulatory Visit: Payer: Self-pay | Admitting: *Deleted

## 2015-10-14 ENCOUNTER — Encounter: Payer: Self-pay | Admitting: *Deleted

## 2015-10-14 NOTE — Patient Outreach (Signed)
[facility]    10/13/2015  Becky JunDorothy I [name] 01/23/1946 [id number]   RN Health Coach telephone call to patient.  [employer] compliance verified. Per patient the congestive heart failure is what she would like to have more education on. Per Patient stated her last admission was for the congestive heart failure. Patient is aware of the zones and the action plans. Per patient she does have the zoneson her refrigerator but feels she still needs more education on this.   Patient has a trach. Per patient she has a little swelling in her legs. Her weight today is 174. Per patient she weighs every day and her weight ranges from 173-175. Discussed with patient about notifying the Dr about weight gain. Patient states she also has chronic pain in her back and knees.  She is having some neuropathy in her lower extremities. [facility] Lee[facility] Deeduch[facility] and got her toenails trimmed . Patient is scheduled for an eye exam next month. Patient stated she may possibly be moving next month. She will let me know of her decision then. Patient is a diabetic and on insulin. Her HGB A1C is 9.7 now. This is up from the last A1C of  9.1.  Per patient her blood sugar was 127 today. Per patient usually  fasting in the 90's. Per patient she walks for exercise but it is not on a regular basis.  Patient agreed to follow up outreach calls.  Assessment: Patient would benefit from Health Coach telephonic outreach for education and support on congestive heart failure and diabetes Knowledge deficit in self management of congestive heart failure  Knowledge deficit in self management of diabetes  Plan:  RN Health Coach will provide ongoing education for patient on congestive heart failure and diabetes through phone calls and sending printed information to patient for further discussion.  RN Health Coach will send a initial barriers letter, assessment and care plan to primary care physician RN  Health Coach will follow up outreach call within 30 days for discussion of materials sent and teach back RN Health sent EMMI educational material on Heart Failure Keeping Track of your weight each day RN Health sent EMMI educational material on Working with your doctor [facility] sent EMMI educational material on When to call your Doctor or 911 [facility] sent EMMI educational material on How to be salt smart RN Health Coach sent chart on low and high salt foods [facility] sent EMMI educational material on Understanding water pills and thirst [facility] sent EMMI educational material on Common Cold [facility] sent EMMI educational material on Cold and Flu [facility] sent EMMI educational material on Pneumonia [facility] sent EMMI educational material on Why get HBG A1C checked 2017 Calendar Book Sent   Becky MaidensFrances Salihah [name] BSN RN [facility] [phone]

## 2015-10-15 NOTE — Telephone Encounter (Signed)
I called to speak with Tammy at [facility] to follow up on the patient. Per Tammy, she faxed a report to the office yesterday. She reports that she saw the patient yesterday and she was doing some better. She was able to walk around the house without being a noticibly SOB. Crackles to the left lower base of her lung, but improved from earlier in the week. Weigh was down 1 lb. Edema showing some improvement. Her BP was ~ 140/80. Tammy is due to see the patient back tomorrow to draw labs per Tereso NewcomerScott Weaver, PA. The patient is due to see [facility] tomorrow morning at 9:30 am, so Tammy is waiting to hear back from the patient what time [facility] will pick her up so she knows when she can get the labs.

## 2015-10-16 ENCOUNTER — Ambulatory Visit (HOSPITAL_COMMUNITY)
Admission: RE | Admit: 2015-10-16 | Discharge: 2015-10-16 | Disposition: A | Discharge status: Home / Self Care | Payer: Medicare Other | Source: Ambulatory Visit | Attending: Family | Admitting: Family

## 2015-10-16 ENCOUNTER — Ambulatory Visit (INDEPENDENT_AMBULATORY_CARE_PROVIDER_SITE_OTHER): Payer: Medicare Other | Admitting: Family

## 2015-10-16 ENCOUNTER — Encounter: Payer: Self-pay | Admitting: Family

## 2015-10-16 ENCOUNTER — Ambulatory Visit (INDEPENDENT_AMBULATORY_CARE_PROVIDER_SITE_OTHER)
Admission: RE | Admit: 2015-10-16 | Discharge: 2015-10-16 | Disposition: A | Discharge status: Home / Self Care | Payer: Medicare Other | Source: Ambulatory Visit | Attending: Vascular Surgery | Admitting: Vascular Surgery

## 2015-10-16 VITALS — BP 132/64 | HR 52 | Temp 97.5°F | Resp 16 | Ht 62.0 in | Wt 183.0 lb

## 2015-10-16 DIAGNOSIS — Z9889 Other specified postprocedural states: Secondary | ICD-10-CM

## 2015-10-16 DIAGNOSIS — Z95828 Presence of other vascular implants and grafts: Secondary | ICD-10-CM | POA: Diagnosis not present

## 2015-10-16 DIAGNOSIS — I129 Hypertensive chronic kidney disease with stage 1 through stage 4 chronic kidney disease, or unspecified chronic kidney disease: Secondary | ICD-10-CM | POA: Insufficient documentation

## 2015-10-16 DIAGNOSIS — I259 Chronic ischemic heart disease, unspecified: Secondary | ICD-10-CM | POA: Diagnosis not present

## 2015-10-16 DIAGNOSIS — I6523 Occlusion and stenosis of bilateral carotid arteries: Secondary | ICD-10-CM

## 2015-10-16 DIAGNOSIS — Z48812 Encounter for surgical aftercare following surgery on the circulatory system: Secondary | ICD-10-CM | POA: Insufficient documentation

## 2015-10-16 DIAGNOSIS — E785 Hyperlipidemia, unspecified: Secondary | ICD-10-CM | POA: Diagnosis not present

## 2015-10-16 DIAGNOSIS — I739 Peripheral vascular disease, unspecified: Secondary | ICD-10-CM | POA: Insufficient documentation

## 2015-10-16 DIAGNOSIS — E1151 Type 2 diabetes mellitus with diabetic peripheral angiopathy without gangrene: Secondary | ICD-10-CM

## 2015-10-16 DIAGNOSIS — R938 Abnormal findings on diagnostic imaging of other specified body structures: Secondary | ICD-10-CM | POA: Diagnosis not present

## 2015-10-16 DIAGNOSIS — I779 Disorder of arteries and arterioles, unspecified: Secondary | ICD-10-CM | POA: Diagnosis not present

## 2015-10-16 DIAGNOSIS — N183 Chronic kidney disease, stage 3 (moderate): Secondary | ICD-10-CM | POA: Diagnosis not present

## 2015-10-16 DIAGNOSIS — E119 Type 2 diabetes mellitus without complications: Secondary | ICD-10-CM | POA: Insufficient documentation

## 2015-10-16 DIAGNOSIS — Z4889 Encounter for other specified surgical aftercare: Secondary | ICD-10-CM

## 2015-10-16 NOTE — Progress Notes (Signed)
Filed Vitals:   10/16/15 1034 10/16/15 1043  BP: 141/64 132/64  Pulse: 65 52  Temp: 97.5 F (36.4 C)   TempSrc: Oral   Resp: 16   Height: 5\' 2"  (1.575 m)   Weight: 183 lb (83.008 kg)   SpO2: 99%

## 2015-10-16 NOTE — Patient Instructions (Signed)
Peripheral Vascular Disease Peripheral vascular disease (PVD) is a disease of the blood vessels that are not part of your heart and brain. A simple term for PVD is poor circulation. In most cases, PVD narrows the blood vessels that carry blood from your heart to the rest of your body. This can result in a decreased supply of blood to your arms, legs, and internal organs, like your stomach or kidneys. However, it most often affects a person's lower legs and feet. There are two types of PVD.  Organic PVD. This is the more common type. It is caused by damage to the structure of blood vessels.  Functional PVD. This is caused by conditions that make blood vessels contract and tighten (spasm). Without treatment, PVD tends to get worse over time. PVD can also lead to acute ischemic limb. This is when an arm or limb suddenly has trouble getting enough blood. This is a medical emergency. CAUSES Each type of PVD has many different causes. The most common cause of PVD is buildup of a fatty material (plaque) inside of your arteries (atherosclerosis). Small amounts of plaque can break off from the walls of the blood vessels and become lodged in a smaller artery. This blocks blood flow and can cause acute ischemic limb. Other common causes of PVD include:  Blood clots that form inside of blood vessels.  Injuries to blood vessels.  Diseases that cause inflammation of blood vessels or cause blood vessel spasms.  Health behaviors and health history that increase your risk of developing PVD. RISK FACTORS  You may have a greater risk of PVD if you:  Have a family history of PVD.  Have certain medical conditions, including:  High cholesterol.  Diabetes.  High blood pressure (hypertension).  Coronary heart disease.  Past problems with blood clots.  Past injury, such as burns or a broken bone. These may have damaged blood vessels in your limbs.  Buerger disease. This is caused by inflamed blood  vessels in your hands and feet.  Some forms of arthritis.  Rare birth defects that affect the arteries in your legs.  Use tobacco.  Do not get enough exercise.  Are obese.  Are age 50 or older. SIGNS AND SYMPTOMS  PVD may cause many different symptoms. Your symptoms depend on what part of your body is not getting enough blood. Some common signs and symptoms include:  Cramps in your lower legs. This may be a symptom of poor leg circulation (claudication).  Pain and weakness in your legs while you are physically active that goes away when you rest (intermittent claudication).  Leg pain when at rest.  Leg numbness, tingling, or weakness.  Coldness in a leg or foot, especially when compared with the other leg.  Skin or hair changes. These can include:  Hair loss.  Shiny skin.  Pale or bluish skin.  Thick toenails.  Inability to get or maintain an erection (erectile dysfunction). People with PVD are more prone to developing ulcers and sores on their toes, feet, or legs. These may take longer than normal to heal. DIAGNOSIS Your health care provider may diagnose PVD from your signs and symptoms. The health care provider will also do a physical exam. You may have tests to find out what is causing your PVD and determine its severity. Tests may include:  Blood pressure recordings from your arms and legs and measurements of the strength of your pulses (pulse volume recordings).  Imaging studies using sound waves to take pictures of   the blood flow through your blood vessels (Doppler ultrasound).  Injecting a dye into your blood vessels before having imaging studies using:  X-rays (angiogram or arteriogram).  Computer-generated X-rays (CT angiogram).  A powerful electromagnetic field and a computer (magnetic resonance angiogram or MRA). TREATMENT Treatment for PVD depends on the cause of your condition and the severity of your symptoms. It also depends on your age. Underlying  causes need to be treated and controlled. These include long-lasting (chronic) conditions, such as diabetes, high cholesterol, and high blood pressure. You may need to first try making lifestyle changes and taking medicines. Surgery may be needed if these do not work. Lifestyle changes may include:  Quitting smoking.  Exercising regularly.  Following a low-fat, low-cholesterol diet. Medicines may include:  Blood thinners to prevent blood clots.  Medicines to improve blood flow.  Medicines to improve your blood cholesterol levels. Surgical procedures may include:  A procedure that uses an inflated balloon to open a blocked artery and improve blood flow (angioplasty).  A procedure to put in a tube (stent) to keep a blocked artery open (stent implant).  Surgery to reroute blood flow around a blocked artery (peripheral bypass surgery).  Surgery to remove dead tissue from an infected wound on the affected limb.  Amputation. This is surgical removal of the affected limb. This may be necessary in cases of acute ischemic limb that are not improved through medical or surgical treatments. HOME CARE INSTRUCTIONS  Take medicines only as directed by your health care provider.  Do not use any tobacco products, including cigarettes, chewing tobacco, or electronic cigarettes. If you need help quitting, ask your health care provider.  Lose weight if you are overweight, and maintain a healthy weight as directed by your health care provider.  Eat a diet that is low in fat and cholesterol. If you need help, ask your health care provider.  Exercise regularly. Ask your health care provider to suggest some good activities for you.  Use compression stockings or other mechanical devices as directed by your health care provider.  Take good care of your feet.  Wear comfortable shoes that fit well.  Check your feet often for any cuts or sores. SEEK MEDICAL CARE IF:  You have cramps in your legs  while walking.  You have leg pain when you are at rest.  You have coldness in a leg or foot.  Your skin changes.  You have erectile dysfunction.  You have cuts or sores on your feet that are not healing. SEEK IMMEDIATE MEDICAL CARE IF:  Your arm or leg turns cold and blue.  Your arms or legs become red, warm, swollen, painful, or numb.  You have chest pain or trouble breathing.  You suddenly have weakness in your face, arm, or leg.  You become very confused or lose the ability to speak.  You suddenly have a very bad headache or lose your vision.   This information is not intended to replace advice given to you by your health care provider. Make sure you discuss any questions you have with your health care provider.   Document Released: 11/17/2004 Document Revised: 10/31/2014 Document Reviewed: 03/20/2014 Elsevier Interactive Patient Education 2016 [employer]  

## 2015-10-16 NOTE — Telephone Encounter (Signed)
Records faxed over to Tereso NewcomerScott Weaver, [place] from [facility]; records in blue folder for Tereso NewcomerScott Weaver, [place] review

## 2015-10-16 NOTE — Progress Notes (Signed)
[facility] [place] HISTORY AND PHYSICAL -PAD  History of Present Illness [name] is a 69 y.o. female patient of Dr. Darrick Penna who is s/p left external iliac to posterior tibial artery bypass using ipsilateral greater saphenous vein on 06/11/2013; this resolved the rest pain in her left foot.  She is also s/p right femoral to above knee popliteal bypass graft and right distal popliteal to posterior tibial artery bypass graft about 2005.  She is s/p left CEA in 2006, states TIA before that, no TIA sx's since then, CEA was done in [place].  She returns today for follow up. She has lumbar spine issues causing bilateral sciatic pain, was getting ESI's by Dr. Ethelene Hal.  Her left foot remains pain free since the vascular BPG with good color, denies non-healing ulcers. She walks a great deal in her house.  Has a trach. since she was intubated in 2013 for pneumonia due to tracheal swelling, managed by her ENT; pt states Dr. Jenne Pane told her that she may be able to have the trach removed soon.  Has CHF, had an MI in 2005.   She is performing seated leg exercises 2-3 days/week.  Recent finding is fracture in lumbar spine.  She had a cardiac cath in March 2016 by Dr. Herbie Baltimore. Her diuretic was increased.  Her podiatrist prescribed gabapentin for feet neuropathy Pt reports that she is walking much more and feels much better mentally and physically.  She is wearing knee high graduated compression hose during the day for lower leg swelling which has decreased the swelling.   Pt Diabetic: Yes, states in control, is seeing Dr. Sharl Ma, endocrinologist, and her DM has improved a great deal.  Pt smoker: former smoker, quit in 2013   Pt meds include:  Statin :Yes  Betablocker: Yes  ASA: Yes  Other anticoagulants/antiplatelets: Plavix      Past Medical History  Diagnosis Date  . Diabetes mellitus     on Lantus 30 U   . CAD (coronary artery disease)     a. S/p  CABG and stenting b. L&R cath 01/09/15 LM patent, LAD 100% occluded, LCX moderate disease, RCA 100% occluded. SVG-RCA CTO, SVG-mid LAD patent with more distal LAD 70% stenosis. Recs for medical management.   Marland Kitchen PAD (peripheral artery disease) (HCC)   . Stroke [facility])     MRI 11/2011 with remote occipital lobe. MRA with moderate left focal vertebral artery stenosis  . Chronic combined systolic and diastolic CHF (congestive heart failure) (HCC)     a. 11/2011 Echo with EF 30-35%, global hypokinesis, and inferior akinesis;  b. 12/2014 Echo: EF 50%, mod LVH, Ao sclerosis w/o stenosis, mildly dil LA, mild to mod RV dysfxn.  . Hyperlipidemia   . DDD (degenerative disc disease), lumbar   . MI (myocardial infarction) (HCC) 1997  . COPD (chronic obstructive pulmonary disease) (HCC)     a. prn and HS supplemental O2.  . Anemia   . Hypertension   . GERD (gastroesophageal reflux disease)   . History of IBS   . Hypothyroidism     Goiter  . Carotid artery occlusion   . CKD (chronic kidney disease), stage III     stage 3  . History of tracheostomy (HCC) 08/26/2013    Dr. Jenne Pane  . Shortness of breath dyspnea     Social History Social History  Substance Use Topics  . Smoking status: Former Smoker -- 1.00 packs/day for 50 years    Types: Cigarettes  Quit date: 01/24/2011  . Smokeless tobacco: Never Used  . Alcohol Use: No    Family History Family History  Problem Relation Age of Onset  . Hyperlipidemia Mother   . Other Mother     AAA  . Alzheimer's disease Mother   . Heart disease Mother   . Irregular heart beat Mother   . Diabetes Daughter   . Hypertension Daughter     Past Surgical History  Procedure Laterality Date  . Ptca    . Thyroidectomy    . Coronary artery bypass graft      2 vessel  . Carotid endarterectomy  ~2008    Left   . Cholecystectomy    . Tracheostomy tube placement  01/02/2012  . Angioplasty  [id number]    Aortogram by Dr. [place] McKenzie [facility])  . Pr vein  bypass graft,aorto-fem-pop      Right common femoral-AK popliteal BPG & Right Popliteal-posterior tibial  . Pr vein bypass graft,aorto-fem-pop      Left Fem-pop BPG  . Carpal tunnel release Right   . Femoral-tibial bypass graft Left 06/11/2013    Procedure: BYPASS GRAFT  LEFT FEMORAL- POSTERIOR TIBIAL ARTERY/ REDO;  Surgeon: Sherren Kerns, MD;  Location: [facility] OR;  Service: Vascular;  Laterality: Left;  . Thrombectomy femoral artery Left 06/11/2013    Procedure: THROMBECTOMY FEMORAL ARTERY;  Surgeon: Sherren Kerns, MD;  Location: [facility] OR;  Service: Vascular;  Laterality: Left;  . Spine surgery  Oct. 27, 2014    Injection - Back  . Abdominal aortagram N/A 04/05/2013    Procedure: ABDOMINAL Ronny Flurry;  Surgeon: Sherren Kerns, MD;  Location: [facility] CATH LAB;  Service: Cardiovascular;  Laterality: N/A;  . Colonoscopy N/A 10/27/2014    Procedure: COLONOSCOPY;  Surgeon: Willis Modena, MD;  Location: [facility] ENDOSCOPY;  Service: Endoscopy;  Laterality: N/A;  . Left and right heart catheterization with coronary angiogram N/A 01/09/2015    Procedure: LEFT AND RIGHT HEART CATHETERIZATION WITH CORONARY ANGIOGRAM;  Surgeon: Marykay Lex, MD;  Location: [facility] CATH LAB;  Service: Cardiovascular;  Laterality: N/A;    Allergies  Allergen Reactions  . Aldactone [Spironolactone] Other (See Comments)    Severe hyperkalemia   . Lisinopril Other (See Comments) and Cough    Hypotension also  . Crestor [Rosuvastatin Calcium] Other (See Comments)    Muscle Pain  . Vicodin [Hydrocodone-Acetaminophen] Nausea And Vomiting    Current Outpatient Prescriptions  Medication Sig Dispense Refill  . aspirin EC 81 MG tablet Take 81 mg by mouth daily.    . carvedilol (COREG) 25 MG tablet Take 25 mg by mouth 2 (two) times daily with a meal.    . clopidogrel (PLAVIX) 75 MG tablet Take 75 mg by mouth daily.    Marland Kitchen dicyclomine (BENTYL) 20 MG tablet Take 20 mg by mouth 3 (three) times daily before meals.    . DULoxetine (CYMBALTA)  60 MG capsule Take 60 mg by mouth daily.  5  . ferrous sulfate 325 (65 FE) MG tablet Take 1 tablet (325 mg total) by mouth 2 (two) times daily with a meal. (Patient taking differently: Take 325 mg by mouth daily. ) 60 tablet 0  . furosemide (LASIX) 40 MG tablet Take 2 tablets (80 mg total) by mouth as directed. Starting 07/31/15: 80 mg in the morning and 40 mg in the afternoon 90 tablet 11  . gabapentin (NEURONTIN) 300 MG capsule Take 300 mg by mouth at bedtime.    Marland Kitchen glucose ([employer] GLUCOSE)  4 GM chewable tablet Chew 1 tablet by mouth as needed for low blood sugar.    Marland Kitchen guaiFENesin (ROBITUSSIN) 100 MG/5ML SOLN Take 5 mLs (100 mg total) by mouth every 4 (four) hours as needed for cough or to loosen phlegm. 236 mL 0  . hydrALAZINE (APRESOLINE) 50 MG tablet Take 50 mg by mouth 3 (three) times daily.    . insulin aspart (NOVOLOG) 100 UNIT/ML injection Inject 12-16 Units into the skin 3 (three) times daily with meals.    . Insulin Glargine (TOUJEO SOLOSTAR) 300 UNIT/ML SOPN Inject 16 Units into the skin at bedtime. (Patient taking differently: Inject 68 Units into the skin at bedtime. )    . ipratropium-albuterol (DUONEB) 0.5-2.5 (3) MG/3ML SOLN Take 3 mLs by nebulization 2 (two) times daily as needed (for shortnes of breath).     . isosorbide mononitrate (IMDUR) 30 MG 24 hr tablet Take 1 tablet (30 mg total) by mouth daily. (Patient taking differently: Take 15 mg by mouth daily. ) 30 tablet 5  . lansoprazole (PREVACID) 30 MG capsule TAKE 1 CAPSULE TWICE DAILY.  5  . levothyroxine (SYNTHROID, LEVOTHROID) 50 MCG tablet Take 50 mcg by mouth daily before breakfast.   6  . Multiple Vitamins-Minerals (WOMENS 50+ MULTI VITAMIN/MIN) TABS Take 1 tablet by mouth daily.    . nitroGLYCERIN (NITROSTAT) 0.4 MG SL tablet Place 0.4 mg under the tongue every 5 (five) minutes as needed. For chest pain    . OXYGEN-HELIUM IN Inhale 4 L into the lungs at bedtime.     . Probiotic Product (PROBIOTIC PO) Take 1 tablet by mouth 2  (two) times daily.    . simvastatin (ZOCOR) 40 MG tablet Take 40 mg by mouth every evening.     . traMADol (ULTRAM) 50 MG tablet Take 50 mg by mouth every 8 (eight) hours as needed.  0  . UNABLE TO FIND Blood work: Basic metabolic Panel on 08/03/15. Dx. CKD Stage 2. Results to Dr. Anne Fu [facility]. 1 each 0  . [DISCONTINUED] dexlansoprazole (DEXILANT) 60 MG capsule Take 60 mg by mouth daily.     No current facility-administered medications for this visit.    ROS: See HPI for pertinent positives and negatives.   Physical Examination  Filed Vitals:   10/16/15 1034  BP: 141/64  Pulse: 65  Temp: 97.5 F (36.4 C)  TempSrc: Oral  Resp: 16  Height:  (1.575 m)  Weight: 183 lb (83.008 kg)  SpO2: 99%   Body mass index is 33.46 kg/(m^2).  General: A&O x 3, WDWN, obese female.  Neck: tracheostomy Gait: slow, halting, using cane.  Eyes: PERRLA. Pterygium in both eyes. Pulmonary: CTAB, without wheezes , rales or rhonchi.  Cardiac: regular rhythm, no detected murmur.  Aorta is not palpable.  Radial pulses: are 2+ palpable and =   VASCULAR EXAM:  Extremities without ischemic changes  without Gangrene; without open wounds.    LE Pulses  LEFT  RIGHT   FEMORAL  palpable  palpable   POPLITEAL  not palpable  not palpable   POSTERIOR TIBIAL  Not palpable, + Dopplerable  Not palpable, + Dopplerable   DORSALIS PEDIS  ANTERIOR TIBIAL  Not palpable, not  Dopplerable  Not palpable, not Dopplerable   PERONEAL + Dopplerable + Dopplerable   Abdomen: soft, no palpable masses, no tenderness..   Skin: no rashes, no ulcers.  Musculoskeletal: no muscle wasting or atrophy.  Neurologic: A&O X 3; Appropriate Affect ; SENSATION: normal; MOTOR FUNCTION:  moving all extremities equally, motor strength 4/5 throughout. Speech is fluent/normal. CN 2-12 intact.              01/12/15 ABI's at [facility]: ABIs indicate a mild reduction in arterial  flow bilaterally at rest. Waveforms in Right leg suggest, however, at least moderate disease in right leg.    Non-Invasive Vascular Imaging: DATE: 10/16/2015  LOWER EXTREMITY ARTERIAL DUPLEX EVALUATION    INDICATION: Follow up left lower extremity bypass graft    PREVIOUS INTERVENTION(S): Right femoropopliteal and popliteal-posterior tibial artery bypass grafts placed in 2015. Left external iliac-posterior tibial arterial bypass graft placed 06/11/2013    DUPLEX EXAM:     RIGHT  LEFT   Peak Systolic Velocity (cm/s) Ratio (if abnormal) Waveform  Peak Systolic Velocity (cm/s) Ratio (if abnormal) Waveform     Inflow Artery 88  Brisk M     Proximal Anastomosis 42  Brisk M     Proximal Graft 42  M     Mid Graft 37  M      Distal Graft 38  M     Distal Anastomosis 46  M     Outflow Artery 44  M  .81 Today's ABI / TBI .71  .96 Previous ABI / TBI (  03/06/14) .91    Waveform:    M - Monophasic       B - Biphasic       T - Triphasic  If Ankle Brachial Index (ABI) or Toe Brachial Index (TBI) performed, please see complete report  ADDITIONAL FINDINGS:     IMPRESSION: 1. Patent left external iliac to posterior tibial bypass graft with no stenosis visualized, waveforms are noted to be dampened with low velocity which may indicate inflow stenosis.    Compared to the previous exam:  No significant change since prior exam     ASSESSMENT: [name] is a 69 y.o. female who is s/p left external iliac to mid posterior tibial artery bypass graft on 06/11/13; Right femoral to above-knee popliteal artery and popliteal to posterior tibial artery bypass grafts in 2005, left CEA in 2006. She has lumbar spine issues causing bilateral sciatic pain, was getting ESI's by Dr. Ethelene Hal.  Her left foot remains pain free since the vascular BPG with good color, denies non-healing ulcers. She had a TIA before the left CEA, no TIA or stroke symptoms since then.  Today's left LE arterial duplex  suggests a patent left external iliac to posterior tibial bypass graft with no stenosis visualized, waveforms are noted to be dampened with low velocity which may indicate inflow stenosis. No significant change since prior exam.  Bilateral ABI's were normal and have decreased to mild and moderate arterial occlusive disease.  She has no signs of ischemia in her LE's.  Her atherosclerotic risk factors include DM, former smoker, CAD.  Face to face time with patient was 25 minutes. Over 50% of this time was spent on counseling and coordination of care.    PLAN:  Gradually increase daily seated leg exercises since she has low back and anterior thigh pain with walking.   Based on the patient's vascular studies and examination, pt will return to clinic in 1 year with ABI's, left LE arterial Duplex, and carotid Duplex. I advised pt to notify us if she develops concern re the circulation in her legs/feet.   I discussed in depth with the patient the nature of atherosclerosis, and emphasized the importance of maximal medical management including strict control of  blood pressure, blood glucose, and lipid levels, obtaining regular exercise, and continued cessation of smoking.  The patient is aware that without maximal medical management the underlying atherosclerotic disease process will progress, limiting the benefit of any interventions.  The patient was given information about PAD including signs, symptoms, treatment, what symptoms should prompt the patient to seek immediate medical care, and risk reduction measures to take.  Charisse MarchSuzanne Montez Cuda, RN, MSN, FNP-C [facility] [place] Office Phone: [phone]  Clinic MD: Early on call  10/16/2015 10:41 AM

## 2015-10-20 NOTE — Telephone Encounter (Signed)
Reviewed notes. Please FU with [facility] nurse for update on her condition. Tereso NewcomerScott Carrieann Spielberg, PA-C   10/20/2015 4:57 PM

## 2015-10-22 ENCOUNTER — Encounter: Payer: Self-pay | Admitting: Physician Assistant

## 2015-10-22 NOTE — Telephone Encounter (Signed)
[facility] for lab results and to check on see how pt was doing. I then called pt. I went over lab results with pt. I advied pt per DOD Dr. Excell Seltzerooper said lab work is improving, to continue lasix 80 mg AM, 40 mg PM. Keep Dr. Anne FuSkains 11/11/15 appt. I asked pt how was she feeling. Pt states she is starting to feel better. Pt said she went to PCP yesterday, was put on antibiotic yesterday, pt states PCP told her to make sure to wear her O2 more, pt states she is on 4 Liters O2. Pt states edema is doing better, sob breath getting better. I advised pt to be sure when sitting down that she is elevating legs and prop an extra pillow under legs so they are elevated. Pt agreeable to plan of care and thanked us for helping her.

## 2015-10-22 NOTE — Telephone Encounter (Signed)
Lmom for [facility] Tammy for lab results and to check on see how [name] was doing. I then called [name]. I went over lab results with [name]. I advied [name] per DOD Dr. Cooper said lab work is improving, to continue lasix 80 mg AM, 40 mg PM. Keep Dr. Skains 11/11/15 appt. I asked [name] how was she feeling. [name] states she is starting to feel better. [name] said she went to PCP yesterday, was put on antibiotic yesterday, [name] states PCP told her to make sure to wear her O2 more, [name] states she is on 4 Liters O2. [name] states edema is doing better, sob breath getting better. I advised [name] to be sure when sitting down that she is elevating legs and prop an extra pillow under legs so they are elevated. [name] agreeable to plan of care and thanked us for helping her. 

## 2015-11-03 ENCOUNTER — Telehealth (HOSPITAL_COMMUNITY): Payer: Self-pay | Admitting: *Deleted

## 2015-11-04 ENCOUNTER — Encounter: Payer: Self-pay | Admitting: Internal Medicine

## 2015-11-04 ENCOUNTER — Ambulatory Visit (INDEPENDENT_AMBULATORY_CARE_PROVIDER_SITE_OTHER): Payer: Medicare Other | Admitting: Internal Medicine

## 2015-11-04 VITALS — BP 132/84 | HR 67 | Ht 62.0 in | Wt 184.0 lb

## 2015-11-04 DIAGNOSIS — J449 Chronic obstructive pulmonary disease, unspecified: Secondary | ICD-10-CM

## 2015-11-04 DIAGNOSIS — Z930 Tracheostomy status: Secondary | ICD-10-CM | POA: Diagnosis not present

## 2015-11-04 DIAGNOSIS — Z87891 Personal history of nicotine dependence: Secondary | ICD-10-CM

## 2015-11-04 DIAGNOSIS — J9611 Chronic respiratory failure with hypoxia: Secondary | ICD-10-CM | POA: Diagnosis not present

## 2015-11-04 DIAGNOSIS — Z129 Encounter for screening for malignant neoplasm, site unspecified: Secondary | ICD-10-CM | POA: Insufficient documentation

## 2015-11-04 DIAGNOSIS — Z122 Encounter for screening for malignant neoplasm of respiratory organs: Secondary | ICD-10-CM

## 2015-11-04 NOTE — Patient Instructions (Addendum)
ICD-9-CM ICD-10-CM   1. Chronic respiratory failure with hypoxia (HCC) 518.83 J96.11    799.02    2. Cancer screening V76.9 Z12.9   3. COPD, severe (HCC) 496 J44.9   4. Status post tracheostomy (HCC) V44.0 Z93.0    COPD - cotninue duoneb. Recommend add pulmicort nebs by palliative service twice daily. Use o2 at night. Start portable oxygen with exertion  Cancer screen -  Do Low Dose CT lung cancer screen  Trach care - by Dr Loma SenderBates  Followuop [facility]- We'll call you with results of CT scan chest - return to see me or my NP in 8  Weeks\ sooner if needed to see improvement on oxygen - Optimize cardiac status with the help of cardiology

## 2015-11-04 NOTE — Addendum Note (Signed)
Addended by: Nicanor AlconNAGLE, Ita Fritzsche K on: 11/04/2015 11:10 AM   Modules accepted: Orders

## 2015-11-04 NOTE — Progress Notes (Signed)
Subjective:    Patient ID: Scot Jun, female    DOB: 1946/05/10, 70 y.o.   MRN: [id number] PCP Cala Bradford, MD  HPI  IOV 11/04/2015  Chief Complaint  Patient presents with  . Pulmonary Consult    Pt referred by Mary Sella, NP to establish care. Pt was admitted in October for respiratory failure. Pt c/o DOE, cough with little mucus production. Pt denies CP/tightness.    New evaluation referred by Filomena Jungling of palliative services [facility]. This evaluation is for chronic respiratory failure and chronic COPD. This in the setting of obesity, chronic status post tracheostomy and multiple medical problems including chronic systolic heart failure EF 35% October 2016  Patient gives most of the history. History is also reviewed from past medical chart in Epic EMR and also outside records. 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 Jenne Pane for stridor. Since then she's been on chronic tracheostomy. She uses oxygen for the tracheostomy at night. She uses a Passy-Muir valve to communicate. She is not on ventilator. At baseline she is tablet treat and carries out independent activities of daily living with a cane. At baseline she reports chronic cough and chronic shortness of breath for walking across a 2 bedroom apartment. She also has chronic edema. She is only on nocturnal oxygen and that the daytime uses oxygen when necessary based on subjective sensation of dyspnea but is only very occasionally. Medication review shows she is on duo neb which she says she uses 3 times a day. I do not see Pulmicort on an inhaled steroid in her medication list. Med review also shows she is on non-specific beta blocker Coreg  Currently she is feeling at baseline and there are no new acute issues. She is interested in lung cancer screening. She's never had CT scan of the  chest before. We do not know the severity of COPD because she is now status post tracheostomy.  Refused flu shot   Lab review Chest x-ray October 2016 and personally visualized looks wet  Walking desaturation test 185 feet 3 laps on room air: She walked very slowly and completed only half lap and she desaturated to 86% and had to stop because of dyspnea.   CAT COPD Symptom & Quality of Life Score (GSK trademark) 0 is no burden. 5 is highest burden 11/04/2015   Never Cough -> Cough all the time 5  No phlegm in chest -> Chest is full of phlegm 5  No chest tightness -> Chest feels very tight 0  No dyspnea for 1 flight stairs/hill -> Very dyspneic for 1 flight of stairs 5  No limitations for ADL at home -> Very limited with ADL at home 0  Confident leaving home -> Not at all confident leaving home 0  Sleep soundly -> Do not sleep soundly because of lung condition 0  Lots of Energy -> No energy at all 5  TOTAL Score (max 40)  20       has a past medical history of Diabetes mellitus; CAD (coronary artery disease); PAD (peripheral artery disease) (HCC); Stroke [facility]); Chronic combined systolic and diastolic CHF (congestive heart failure) (HCC); Hyperlipidemia; DDD (degenerative disc disease), lumbar; MI (myocardial infarction) (HCC) (1997); COPD (chronic obstructive pulmonary disease) (HCC); Anemia; Hypertension; GERD (gastroesophageal reflux disease); History of IBS; Hypothyroidism; Carotid artery occlusion; CKD (chronic kidney disease), stage III; History  of tracheostomy (HCC) (08/26/2013); and Shortness of breath dyspnea.   reports that she quit smoking about 3 years ago. Her smoking use included Cigarettes. She has a 50 pack-year smoking history. She has never used smokeless tobacco.  Past Surgical History  Procedure Laterality Date  . Ptca    . Thyroidectomy    . Coronary artery bypass graft      2 vessel  . Carotid endarterectomy  ~2008    Left   . Cholecystectomy    .  Tracheostomy tube placement  01/02/2012  . Angioplasty  [id number]    Aortogram by Dr. [place] McKenzie [facility]  . Pr vein bypass graft,aorto-fem-pop      Right common femoral-AK popliteal BPG & Right Popliteal-posterior tibial  . Pr vein bypass graft,aorto-fem-pop      Left Fem-pop BPG  . Carpal tunnel release Right   . Femoral-tibial bypass graft Left 06/11/2013    Procedure: BYPASS GRAFT  LEFT FEMORAL- POSTERIOR TIBIAL ARTERY/ REDO;  Surgeon: Sherren Kernsharles E Fields, MD;  [facility];  Service: Vascular;  Laterality: Left;  . Thrombectomy femoral artery Left 06/11/2013    Procedure: THROMBECTOMY FEMORAL ARTERY;  Surgeon: Sherren Kernsharles E Fields, MD;  [facility];  Service: Vascular;  Laterality: Left;  . Spine surgery  Oct. 27, 2014    Injection - Back  . Abdominal aortagram N/A 04/05/2013    Procedure: ABDOMINAL Ronny FlurryAORTAGRAM;  Surgeon: Sherren Kernsharles E Fields, MD;  [facility];  Service: Cardiovascular;  Laterality: N/A;  . Colonoscopy N/A 10/27/2014    Procedure: COLONOSCOPY;  Surgeon: Willis ModenaWilliam Outlaw, MD;  [facility];  Service: Endoscopy;  Laterality: N/A;  . Left and right heart catheterization with coronary angiogram N/A 01/09/2015    Procedure: LEFT AND RIGHT HEART CATHETERIZATION WITH CORONARY ANGIOGRAM;  Surgeon: Marykay Lexavid W Harding, MD;  [facility];  Service: Cardiovascular;  Laterality: N/A;    Allergies  Allergen Reactions  . Aldactone [Spironolactone] Other (See Comments)    Severe hyperkalemia   . Lisinopril Other (See Comments) and Cough    Hypotension also  . Crestor [Rosuvastatin Calcium] Other (See Comments)    Muscle Pain  . Vicodin [Hydrocodone-Acetaminophen] Nausea And Vomiting    Immunization History  Administered Date(s) Administered  . Influenza,inj,Quad PF,36+ Mos 07/25/2015  . Pneumococcal Polysaccharide-23 01/02/2013, 01/11/2015    Family History  Problem Relation Age of Onset  . Hyperlipidemia Mother   . Other Mother     AAA  .  Alzheimer's disease Mother   . Heart disease Mother   . Irregular heart beat Mother   . Diabetes Daughter   . Hypertension Daughter      Current outpatient prescriptions:  .  aspirin EC 81 MG tablet, Take 81 mg by mouth daily., Disp: , Rfl:  .  carvedilol (COREG) 25 MG tablet, Take 25 mg by mouth 2 (two) times daily with a meal., Disp: , Rfl:  .  clopidogrel (PLAVIX) 75 MG tablet, Take 75 mg by mouth daily., Disp: , Rfl:  .  dicyclomine (BENTYL) 20 MG tablet, Take 20 mg by mouth 3 (three) times daily before meals., Disp: , Rfl:  .  DULoxetine (CYMBALTA) 60 MG capsule, Take 60 mg by mouth daily., Disp: , Rfl: 5 .  ferrous sulfate 325 (65 FE) MG tablet, Take 1 tablet (325 mg total) by mouth 2 (two) times daily with a meal. (Patient taking differently: Take 325 mg by mouth daily. ), Disp: 60 tablet, Rfl: 0 .  furosemide (LASIX)  40 MG tablet, Take 2 tablets (80 mg total) by mouth as directed. Starting 07/31/15: 80 mg in the morning and 40 mg in the afternoon, Disp: 90 tablet, Rfl: 11 .  gabapentin (NEURONTIN) 300 MG capsule, Take 300 mg by mouth at bedtime., Disp: , Rfl:  .  glucose ([employer] GLUCOSE) 4 GM chewable tablet, Chew 1 tablet by mouth as needed for low blood sugar., Disp: , Rfl:  .  guaiFENesin (ROBITUSSIN) 100 MG/5ML SOLN, Take 5 mLs (100 mg total) by mouth every 4 (four) hours as needed for cough or to loosen phlegm., Disp: 236 mL, Rfl: 0 .  hydrALAZINE (APRESOLINE) 50 MG tablet, Take 50 mg by mouth every 8 (eight) hours as needed. , Disp: , Rfl:  .  insulin aspart (NOVOLOG) 100 UNIT/ML injection, Inject 12-16 Units into the skin 3 (three) times daily with meals., Disp: , Rfl:  .  Insulin Degludec (TRESIBA FLEXTOUCH) 100 UNIT/ML SOPN, Inject 48 Units into the skin at bedtime., Disp: , Rfl:  .  ipratropium-albuterol (DUONEB) 0.5-2.5 (3) MG/3ML SOLN, Take 3 mLs by nebulization 2 (two) times daily as needed (for shortnes of breath). , Disp: , Rfl:  .  isosorbide mononitrate (IMDUR) 30 MG 24 hr  tablet, Take 1 tablet (30 mg total) by mouth daily. (Patient taking differently: Take 15 mg by mouth daily. ), Disp: 30 tablet, Rfl: 5 .  lansoprazole (PREVACID) 30 MG capsule, TAKE 1 CAPSULE TWICE DAILY., Disp: , Rfl: 5 .  levothyroxine (SYNTHROID, LEVOTHROID) 50 MCG tablet, Take 50 mcg by mouth daily before breakfast. , Disp: , Rfl: 6 .  Multiple Vitamins-Minerals (WOMENS 50+ MULTI VITAMIN/MIN) TABS, Take 1 tablet by mouth daily., Disp: , Rfl:  .  nitroGLYCERIN (NITROSTAT) 0.4 MG SL tablet, Place 0.4 mg under the tongue every 5 (five) minutes as needed. For chest pain, Disp: , Rfl:  .  OXYGEN-HELIUM IN, Inhale 4 L into the lungs at bedtime. , Disp: , Rfl:  .  Probiotic Product (PROBIOTIC PO), Take 1 tablet by mouth 2 (two) times daily., Disp: , Rfl:  .  simvastatin (ZOCOR) 40 MG tablet, Take 40 mg by mouth every evening. , Disp: , Rfl:  .  traMADol (ULTRAM) 50 MG tablet, Take 50 mg by mouth every 8 (eight) hours as needed., Disp: , Rfl: 0 .  UNABLE TO FIND, Blood work: Basic metabolic Panel on 08/03/15. Dx. CKD Stage 2. Results to Dr. Anne Fu with [facility]., Disp: 1 each, Rfl: 0 .  [DISCONTINUED] dexlansoprazole (DEXILANT) 60 MG capsule, Take 60 mg by mouth daily., Disp: , Rfl:      Review of Systems  Constitutional: Negative for fever and unexpected weight change.  HENT: Negative for congestion, dental problem, ear pain, nosebleeds, postnasal drip, rhinorrhea, sinus pressure, sneezing, sore throat and trouble swallowing.   Eyes: Negative for redness and itching.  Respiratory: Positive for cough and shortness of breath. Negative for chest tightness and wheezing.   Cardiovascular: Negative for palpitations and leg swelling.  Gastrointestinal: Negative for nausea and vomiting.  Genitourinary: Negative for dysuria.  Musculoskeletal: Negative for joint swelling.  Skin: Negative for rash.  Neurological: Negative for headaches.  Hematological: Does not bruise/bleed easily.    Psychiatric/Behavioral: Negative for dysphoric mood. The patient is not nervous/anxious.        Objective:   Physical Exam  Constitutional: She is oriented to person, place, and time. She appears well-developed and well-nourished. No distress.  Obese female seated comfortably  HENT:  Head: Normocephalic and atraumatic.  Right Ear: External ear normal.  Left Ear: External ear normal.  Mouth/Throat: Oropharynx is clear and moist. No oropharyngeal exudate.  Chronic tracheostomy with Passy-Muir valve no oxygen  Eyes: Conjunctivae and EOM are normal. Pupils are equal, round, and reactive to light. Right eye exhibits no discharge. Left eye exhibits no discharge. No scleral icterus.  Neck: Normal range of motion. Neck supple. No JVD present. No tracheal deviation present. No thyromegaly present.  Cardiovascular: Normal rate, regular rhythm, normal heart sounds and intact distal pulses.  Exam reveals no gallop and no friction rub.   No murmur heard. Pulmonary/Chest: Effort normal and breath sounds normal. No respiratory distress. She has no wheezes. She has no rales. She exhibits no tenderness.  Overall diminished air entry no wheeze  Abdominal: Soft. Bowel sounds are normal. She exhibits no mass. There is no tenderness. There is no rebound and no guarding.  Visceral obesity present  Musculoskeletal: Normal range of motion. She exhibits no edema or tenderness.  Has a cane antalgic gait  Lymphadenopathy:    She has no cervical adenopathy.  Neurological: She is alert and oriented to person, place, and time. She has normal reflexes. No cranial nerve deficit. She exhibits normal muscle tone. Coordination normal.  Skin: Skin is warm and dry. No rash noted. She is not diaphoretic. No erythema. No pallor.  Psychiatric: She has a normal mood and affect. Her behavior is normal. Judgment and thought content normal.  Vitals reviewed.   Filed Vitals:   11/04/15 1013  BP: 132/84  Pulse: 67  Height:  5\' 2"  (1.575 m)  Weight: 184 lb (83.462 kg)  SpO2: 91%          Assessment & Plan:     ICD-9-CM ICD-10-CM   1. Chronic respiratory failure with hypoxia (HCC) 518.83 J96.11    799.02    2. Cancer screening V76.9 Z12.9   3. COPD, severe (HCC) 496 J44.9   4. Status post tracheostomy (HCC) V44.0 Z93.0     Cancer screen #lung cancer screening I discussed screening Ct chest for early detection of lung cancer Explained that in age 60-75 and smoking history, annual low dose CT chest can pick up lung cancer early and has potential to save lives and cure lung cancer This is similar in concept to screening mammogram, colonoscopies and pap smears I explained Ct scan is low dose radiation I explained early lung cancer asymptomatic and only way to  detect is CT  With the real advantage that early lung cancer is curable through radiation or surgery I explained CT superior to CXR I explained that false positives are present and can incur cost and workup like biopsies, additional scan but benefit outweighs risk I recommend one a year for a minimum of 3 years   #COPD - COPD - cotninue duoneb. Recommend add pulmicort nebs by palliative service twice daily. Use o2 at night. - Start portable oxygen with exertion  Cancer screen -  Do Low Dose CT lung cancer screen  Trach care - by Dr Loma Sender [facility] call you with results of CT scan chest - return to see me or my NP in 8 weeks to see progress with shortness of breath on oxygen   Dr. Kalman Shan, M.D., [facility].C.P Pulmonary and Critical Care Medicine Staff Physician [facility] [place] [facility] Pager: [phone], If no answer or between  15:00h - 7:00h: call [phone]  11/04/2015 10:47 AM

## 2015-11-06 NOTE — Patient Outreach (Signed)
error 

## 2015-11-10 ENCOUNTER — Inpatient Hospital Stay (HOSPITAL_COMMUNITY)
Admission: EM | Admit: 2015-11-10 | Discharge: 2015-11-19 | DRG: 291 | Disposition: A | Discharge status: Home / Self Care | Payer: Medicare Other | Attending: Internal Medicine | Admitting: Internal Medicine

## 2015-11-10 ENCOUNTER — Emergency Department (HOSPITAL_COMMUNITY): Payer: Medicare Other

## 2015-11-10 ENCOUNTER — Encounter (HOSPITAL_COMMUNITY): Payer: Self-pay | Admitting: Emergency Medicine

## 2015-11-10 ENCOUNTER — Other Ambulatory Visit: Payer: Self-pay | Admitting: *Deleted

## 2015-11-10 DIAGNOSIS — Z9981 Dependence on supplemental oxygen: Secondary | ICD-10-CM | POA: Diagnosis not present

## 2015-11-10 DIAGNOSIS — Z66 Do not resuscitate: Secondary | ICD-10-CM | POA: Diagnosis present

## 2015-11-10 DIAGNOSIS — D649 Anemia, unspecified: Secondary | ICD-10-CM

## 2015-11-10 DIAGNOSIS — Z7982 Long term (current) use of aspirin: Secondary | ICD-10-CM | POA: Diagnosis not present

## 2015-11-10 DIAGNOSIS — Z930 Tracheostomy status: Secondary | ICD-10-CM | POA: Diagnosis not present

## 2015-11-10 DIAGNOSIS — K921 Melena: Secondary | ICD-10-CM | POA: Diagnosis present

## 2015-11-10 DIAGNOSIS — I10 Essential (primary) hypertension: Secondary | ICD-10-CM | POA: Diagnosis not present

## 2015-11-10 DIAGNOSIS — D638 Anemia in other chronic diseases classified elsewhere: Secondary | ICD-10-CM | POA: Diagnosis present

## 2015-11-10 DIAGNOSIS — Z888 Allergy status to other drugs, medicaments and biological substances status: Secondary | ICD-10-CM

## 2015-11-10 DIAGNOSIS — R9431 Abnormal electrocardiogram [ECG] [EKG]: Secondary | ICD-10-CM | POA: Diagnosis not present

## 2015-11-10 DIAGNOSIS — E1129 Type 2 diabetes mellitus with other diabetic kidney complication: Secondary | ICD-10-CM

## 2015-11-10 DIAGNOSIS — J9621 Acute and chronic respiratory failure with hypoxia: Secondary | ICD-10-CM | POA: Diagnosis present

## 2015-11-10 DIAGNOSIS — I509 Heart failure, unspecified: Secondary | ICD-10-CM

## 2015-11-10 DIAGNOSIS — Z833 Family history of diabetes mellitus: Secondary | ICD-10-CM

## 2015-11-10 DIAGNOSIS — I5023 Acute on chronic systolic (congestive) heart failure: Secondary | ICD-10-CM

## 2015-11-10 DIAGNOSIS — Z951 Presence of aortocoronary bypass graft: Secondary | ICD-10-CM | POA: Diagnosis not present

## 2015-11-10 DIAGNOSIS — I440 Atrioventricular block, first degree: Secondary | ICD-10-CM | POA: Diagnosis present

## 2015-11-10 DIAGNOSIS — E039 Hypothyroidism, unspecified: Secondary | ICD-10-CM | POA: Diagnosis present

## 2015-11-10 DIAGNOSIS — J449 Chronic obstructive pulmonary disease, unspecified: Secondary | ICD-10-CM | POA: Diagnosis present

## 2015-11-10 DIAGNOSIS — I2510 Atherosclerotic heart disease of native coronary artery without angina pectoris: Secondary | ICD-10-CM | POA: Diagnosis present

## 2015-11-10 DIAGNOSIS — K219 Gastro-esophageal reflux disease without esophagitis: Secondary | ICD-10-CM | POA: Diagnosis present

## 2015-11-10 DIAGNOSIS — I5043 Acute on chronic combined systolic (congestive) and diastolic (congestive) heart failure: Secondary | ICD-10-CM | POA: Diagnosis present

## 2015-11-10 DIAGNOSIS — E785 Hyperlipidemia, unspecified: Secondary | ICD-10-CM | POA: Diagnosis present

## 2015-11-10 DIAGNOSIS — Z794 Long term (current) use of insulin: Secondary | ICD-10-CM

## 2015-11-10 DIAGNOSIS — F329 Major depressive disorder, single episode, unspecified: Secondary | ICD-10-CM | POA: Diagnosis present

## 2015-11-10 DIAGNOSIS — Z87891 Personal history of nicotine dependence: Secondary | ICD-10-CM

## 2015-11-10 DIAGNOSIS — R001 Bradycardia, unspecified: Secondary | ICD-10-CM | POA: Diagnosis present

## 2015-11-10 DIAGNOSIS — IMO0002 Reserved for concepts with insufficient information to code with codable children: Secondary | ICD-10-CM

## 2015-11-10 DIAGNOSIS — I130 Hypertensive heart and chronic kidney disease with heart failure and stage 1 through stage 4 chronic kidney disease, or unspecified chronic kidney disease: Secondary | ICD-10-CM | POA: Diagnosis present

## 2015-11-10 DIAGNOSIS — R778 Other specified abnormalities of plasma proteins: Secondary | ICD-10-CM | POA: Diagnosis present

## 2015-11-10 DIAGNOSIS — E1159 Type 2 diabetes mellitus with other circulatory complications: Secondary | ICD-10-CM | POA: Diagnosis present

## 2015-11-10 DIAGNOSIS — Z7902 Long term (current) use of antithrombotics/antiplatelets: Secondary | ICD-10-CM

## 2015-11-10 DIAGNOSIS — E1165 Type 2 diabetes mellitus with hyperglycemia: Secondary | ICD-10-CM | POA: Diagnosis present

## 2015-11-10 DIAGNOSIS — E1122 Type 2 diabetes mellitus with diabetic chronic kidney disease: Secondary | ICD-10-CM | POA: Diagnosis present

## 2015-11-10 DIAGNOSIS — Z8673 Personal history of transient ischemic attack (TIA), and cerebral infarction without residual deficits: Secondary | ICD-10-CM

## 2015-11-10 DIAGNOSIS — I5042 Chronic combined systolic (congestive) and diastolic (congestive) heart failure: Secondary | ICD-10-CM | POA: Diagnosis present

## 2015-11-10 DIAGNOSIS — K580 Irritable bowel syndrome with diarrhea: Secondary | ICD-10-CM | POA: Diagnosis present

## 2015-11-10 DIAGNOSIS — Z95828 Presence of other vascular implants and grafts: Secondary | ICD-10-CM | POA: Diagnosis not present

## 2015-11-10 DIAGNOSIS — I252 Old myocardial infarction: Secondary | ICD-10-CM | POA: Diagnosis not present

## 2015-11-10 DIAGNOSIS — J9601 Acute respiratory failure with hypoxia: Secondary | ICD-10-CM | POA: Diagnosis not present

## 2015-11-10 DIAGNOSIS — R0602 Shortness of breath: Secondary | ICD-10-CM

## 2015-11-10 DIAGNOSIS — N183 Chronic kidney disease, stage 3 (moderate): Secondary | ICD-10-CM | POA: Diagnosis present

## 2015-11-10 DIAGNOSIS — K649 Unspecified hemorrhoids: Secondary | ICD-10-CM | POA: Diagnosis present

## 2015-11-10 DIAGNOSIS — R197 Diarrhea, unspecified: Secondary | ICD-10-CM | POA: Diagnosis not present

## 2015-11-10 DIAGNOSIS — R7989 Other specified abnormal findings of blood chemistry: Secondary | ICD-10-CM | POA: Diagnosis present

## 2015-11-10 DIAGNOSIS — N179 Acute kidney failure, unspecified: Secondary | ICD-10-CM | POA: Diagnosis not present

## 2015-11-10 DIAGNOSIS — E1151 Type 2 diabetes mellitus with diabetic peripheral angiopathy without gangrene: Secondary | ICD-10-CM | POA: Diagnosis present

## 2015-11-10 DIAGNOSIS — R195 Other fecal abnormalities: Secondary | ICD-10-CM

## 2015-11-10 DIAGNOSIS — I2582 Chronic total occlusion of coronary artery: Secondary | ICD-10-CM | POA: Diagnosis present

## 2015-11-10 DIAGNOSIS — J9690 Respiratory failure, unspecified, unspecified whether with hypoxia or hypercapnia: Secondary | ICD-10-CM | POA: Insufficient documentation

## 2015-11-10 DIAGNOSIS — N184 Chronic kidney disease, stage 4 (severe): Secondary | ICD-10-CM | POA: Diagnosis present

## 2015-11-10 LAB — CBC WITH DIFFERENTIAL/PLATELET
Basophils Absolute: 0 10*3/uL (ref 0.0–0.1)
Basophils Relative: 0 %
Eosinophils Absolute: 0.2 10*3/uL (ref 0.0–0.7)
Eosinophils Relative: 4 %
HEMATOCRIT: 29.2 % — AB (ref 36.0–46.0)
Hemoglobin: 8.8 g/dL — ABNORMAL LOW (ref 12.0–15.0)
LYMPHS PCT: 11 %
Lymphs Abs: 0.6 10*3/uL — ABNORMAL LOW (ref 0.7–4.0)
MCH: 29.7 pg (ref 26.0–34.0)
MCHC: 30.1 g/dL (ref 30.0–36.0)
MCV: 98.6 fL (ref 78.0–100.0)
MONO ABS: 0.6 10*3/uL (ref 0.1–1.0)
MONOS PCT: 10 %
NEUTROS ABS: 4.4 10*3/uL (ref 1.7–7.7)
Neutrophils Relative %: 75 %
Platelets: 188 10*3/uL (ref 150–400)
RBC: 2.96 MIL/uL — ABNORMAL LOW (ref 3.87–5.11)
RDW: 16.7 % — AB (ref 11.5–15.5)
WBC: 5.8 10*3/uL (ref 4.0–10.5)

## 2015-11-10 LAB — HEMOGLOBIN AND HEMATOCRIT, BLOOD
HCT: 31.2 % — ABNORMAL LOW (ref 36.0–46.0)
HEMOGLOBIN: 9.3 g/dL — AB (ref 12.0–15.0)

## 2015-11-10 LAB — BASIC METABOLIC PANEL
Anion gap: 7 (ref 5–15)
BUN: 55 mg/dL — ABNORMAL HIGH (ref 6–20)
CALCIUM: 8.5 mg/dL — AB (ref 8.9–10.3)
CO2: 29 mmol/L (ref 22–32)
CREATININE: 1.37 mg/dL — AB (ref 0.44–1.00)
Chloride: 112 mmol/L — ABNORMAL HIGH (ref 101–111)
GFR calc Af Amer: 44 mL/min — ABNORMAL LOW (ref >60)
GFR calc non Af Amer: 38 mL/min — ABNORMAL LOW (ref >60)
GLUCOSE: 74 mg/dL (ref 65–99)
Potassium: 3.9 mmol/L (ref 3.5–5.1)
Sodium: 148 mmol/L — ABNORMAL HIGH (ref 135–145)

## 2015-11-10 LAB — TROPONIN I
TROPONIN I: 0.09 ng/mL — AB (ref <0.031)
Troponin I: 0.07 ng/mL — ABNORMAL HIGH (ref <0.031)

## 2015-11-10 LAB — TYPE AND SCREEN
ABO/RH(D): O POS
Antibody Screen: NEGATIVE

## 2015-11-10 LAB — MAGNESIUM: MAGNESIUM: 2.2 mg/dL (ref 1.7–2.4)

## 2015-11-10 LAB — BRAIN NATRIURETIC PEPTIDE: B Natriuretic Peptide: 1053.3 pg/mL — ABNORMAL HIGH (ref 0.0–100.0)

## 2015-11-10 LAB — GLUCOSE, CAPILLARY: Glucose-Capillary: 96 mg/dL (ref 65–99)

## 2015-11-10 LAB — POC OCCULT BLOOD, ED: FECAL OCCULT BLD: POSITIVE — AB

## 2015-11-10 LAB — MRSA PCR SCREENING: MRSA by PCR: NEGATIVE

## 2015-11-10 LAB — PHOSPHORUS: PHOSPHORUS: 3.6 mg/dL (ref 2.5–4.6)

## 2015-11-10 MED ORDER — FUROSEMIDE 10 MG/ML IJ SOLN
40.0000 mg | Freq: Once | INTRAMUSCULAR | Status: AC
  Administered 2015-11-10: 40 mg via INTRAVENOUS
  Filled 2015-11-10: qty 4

## 2015-11-10 MED ORDER — ACETAMINOPHEN 325 MG PO TABS: 650.0000 mg | ORAL_TABLET | Freq: Four times a day (QID) | ORAL | Status: DC | PRN

## 2015-11-10 MED ORDER — HYDRALAZINE HCL 25 MG PO TABS
25.0000 mg | ORAL_TABLET | Freq: Three times a day (TID) | ORAL | Status: DC
  Administered 2015-11-10 – 2015-11-19 (×26): 25 mg via ORAL
  Filled 2015-11-10 (×27): qty 1

## 2015-11-10 MED ORDER — SODIUM CHLORIDE 0.9 % IV SOLN: 250.0000 mL | INTRAVENOUS | Status: DC | PRN

## 2015-11-10 MED ORDER — CLOPIDOGREL BISULFATE 75 MG PO TABS
75.0000 mg | ORAL_TABLET | Freq: Every day | ORAL | Status: DC
  Administered 2015-11-11 – 2015-11-19 (×9): 75 mg via ORAL
  Filled 2015-11-10 (×9): qty 1

## 2015-11-10 MED ORDER — SODIUM CHLORIDE 0.9 % IJ SOLN
3.0000 mL | Freq: Two times a day (BID) | INTRAMUSCULAR | Status: DC
  Administered 2015-11-10 – 2015-11-13 (×7): 3 mL via INTRAVENOUS

## 2015-11-10 MED ORDER — DICYCLOMINE HCL 20 MG PO TABS
20.0000 mg | ORAL_TABLET | Freq: Three times a day (TID) | ORAL | Status: DC
  Administered 2015-11-11 – 2015-11-19 (×25): 20 mg via ORAL
  Filled 2015-11-10 (×29): qty 1

## 2015-11-10 MED ORDER — IPRATROPIUM-ALBUTEROL 0.5-2.5 (3) MG/3ML IN SOLN: 3.0000 mL | Freq: Two times a day (BID) | RESPIRATORY_TRACT | Status: DC | PRN

## 2015-11-10 MED ORDER — ADULT MULTIVITAMIN W/MINERALS CH
1.0000 | ORAL_TABLET | Freq: Every day | ORAL | Status: DC
  Administered 2015-11-11 – 2015-11-19 (×9): 1 via ORAL
  Filled 2015-11-10 (×9): qty 1

## 2015-11-10 MED ORDER — MORPHINE SULFATE (PF) 2 MG/ML IV SOLN
1.0000 mg | INTRAVENOUS | Status: DC | PRN
  Administered 2015-11-11 – 2015-11-12 (×2): 1 mg via INTRAVENOUS
  Filled 2015-11-10 (×2): qty 1

## 2015-11-10 MED ORDER — SODIUM CHLORIDE 0.9 % IJ SOLN: 3.0000 mL | INTRAMUSCULAR | Status: DC | PRN

## 2015-11-10 MED ORDER — SIMVASTATIN 40 MG PO TABS
40.0000 mg | ORAL_TABLET | Freq: Every evening | ORAL | Status: DC
  Administered 2015-11-10 – 2015-11-18 (×9): 40 mg via ORAL
  Filled 2015-11-10 (×9): qty 1

## 2015-11-10 MED ORDER — IPRATROPIUM-ALBUTEROL 0.5-2.5 (3) MG/3ML IN SOLN
3.0000 mL | RESPIRATORY_TRACT | Status: DC
  Administered 2015-11-10 – 2015-11-11 (×5): 3 mL via RESPIRATORY_TRACT
  Filled 2015-11-10 (×6): qty 3

## 2015-11-10 MED ORDER — FERROUS SULFATE 325 (65 FE) MG PO TABS
325.0000 mg | ORAL_TABLET | Freq: Every day | ORAL | Status: DC
  Administered 2015-11-11 – 2015-11-19 (×9): 325 mg via ORAL
  Filled 2015-11-10 (×10): qty 1

## 2015-11-10 MED ORDER — ONDANSETRON HCL 4 MG/2ML IJ SOLN: 4.0000 mg | Freq: Four times a day (QID) | INTRAMUSCULAR | Status: DC | PRN

## 2015-11-10 MED ORDER — INSULIN ASPART 100 UNIT/ML ~~LOC~~ SOLN
0.0000 [IU] | Freq: Three times a day (TID) | SUBCUTANEOUS | Status: DC
  Administered 2015-11-11 (×2): 1 [IU] via SUBCUTANEOUS
  Administered 2015-11-12 (×2): 2 [IU] via SUBCUTANEOUS
  Administered 2015-11-12: 1 [IU] via SUBCUTANEOUS
  Administered 2015-11-13: 2 [IU] via SUBCUTANEOUS
  Administered 2015-11-13: 5 [IU] via SUBCUTANEOUS
  Administered 2015-11-14 – 2015-11-15 (×3): 2 [IU] via SUBCUTANEOUS
  Administered 2015-11-15 – 2015-11-16 (×3): 1 [IU] via SUBCUTANEOUS
  Administered 2015-11-17: 7 [IU] via SUBCUTANEOUS
  Administered 2015-11-17: 1 [IU] via SUBCUTANEOUS
  Administered 2015-11-17: 2 [IU] via SUBCUTANEOUS
  Administered 2015-11-18: 3 [IU] via SUBCUTANEOUS
  Administered 2015-11-18: 1 [IU] via SUBCUTANEOUS
  Administered 2015-11-18: 5 [IU] via SUBCUTANEOUS
  Administered 2015-11-19 (×2): 1 [IU] via SUBCUTANEOUS

## 2015-11-10 MED ORDER — DULOXETINE HCL 60 MG PO CPEP
60.0000 mg | ORAL_CAPSULE | Freq: Every day | ORAL | Status: DC
  Administered 2015-11-11 – 2015-11-18 (×7): 60 mg via ORAL
  Filled 2015-11-10 (×12): qty 1

## 2015-11-10 MED ORDER — LOPERAMIDE HCL 2 MG PO CAPS
2.0000 mg | ORAL_CAPSULE | Freq: Once | ORAL | Status: AC
  Administered 2015-11-10: 2 mg via ORAL
  Filled 2015-11-10: qty 1

## 2015-11-10 MED ORDER — PANTOPRAZOLE SODIUM 40 MG IV SOLR
40.0000 mg | Freq: Two times a day (BID) | INTRAVENOUS | Status: DC
  Administered 2015-11-10 – 2015-11-11 (×3): 40 mg via INTRAVENOUS
  Filled 2015-11-10 (×3): qty 40

## 2015-11-10 MED ORDER — ISOSORBIDE MONONITRATE ER 30 MG PO TB24
15.0000 mg | ORAL_TABLET | Freq: Every day | ORAL | Status: DC
  Administered 2015-11-11 – 2015-11-19 (×9): 15 mg via ORAL
  Filled 2015-11-10 (×9): qty 1

## 2015-11-10 MED ORDER — ONDANSETRON HCL 4 MG PO TABS: 4.0000 mg | ORAL_TABLET | Freq: Four times a day (QID) | ORAL | Status: DC | PRN

## 2015-11-10 MED ORDER — ALBUTEROL SULFATE (2.5 MG/3ML) 0.083% IN NEBU
5.0000 mg | INHALATION_SOLUTION | Freq: Once | RESPIRATORY_TRACT | Status: AC
  Administered 2015-11-10: 5 mg via RESPIRATORY_TRACT
  Filled 2015-11-10: qty 6

## 2015-11-10 MED ORDER — WOMENS 50+ MULTI VITAMIN/MIN PO TABS: 1.0000 | ORAL_TABLET | Freq: Every day | ORAL | Status: DC

## 2015-11-10 MED ORDER — LEVOTHYROXINE SODIUM 50 MCG PO TABS
50.0000 ug | ORAL_TABLET | Freq: Every day | ORAL | Status: DC
  Administered 2015-11-11 – 2015-11-19 (×9): 50 ug via ORAL
  Filled 2015-11-10 (×9): qty 1

## 2015-11-10 MED ORDER — FUROSEMIDE 10 MG/ML IJ SOLN
60.0000 mg | Freq: Two times a day (BID) | INTRAMUSCULAR | Status: DC
  Administered 2015-11-10 – 2015-11-13 (×7): 60 mg via INTRAVENOUS
  Filled 2015-11-10 (×8): qty 6

## 2015-11-10 MED ORDER — ACETAMINOPHEN 650 MG RE SUPP: 650.0000 mg | Freq: Four times a day (QID) | RECTAL | Status: DC | PRN

## 2015-11-10 MED ORDER — CARVEDILOL 25 MG PO TABS
25.0000 mg | ORAL_TABLET | Freq: Two times a day (BID) | ORAL | Status: DC
  Administered 2015-11-11 – 2015-11-15 (×10): 25 mg via ORAL
  Filled 2015-11-10 (×9): qty 1

## 2015-11-10 NOTE — Progress Notes (Signed)
Attempted report x1 . ED nurse to call back

## 2015-11-10 NOTE — H&P (Signed)
[facility] History and Physical  [name] ZOX:[id number] DOB: Feb 24, 1946 DOA: 11/10/2015  Referring physician: Dr. Marily Memos, EDP PCP: Cala Bradford, MD  Specialists: Dr. Randa Evens, GI  Chief Complaint: Shortness of breath  HPI: [name] is a 70 y.o. female  With a history of heart failure, diabetes, coronary artery disease, tracheostomy, that presented to the emergency department with complaints of shortness of breath. Over the last week, patient has been more short of breath and having abdominal distention as well as lower extremity swelling. She states she has gained a few pounds in the last week as well. She has been taking her Lasix. Patient is also complaining of dry cough however no fevers. She denies a sick contacts or recent travel. Patient states it makes her shortness of breath better or worse. She has not noticed any bloody stools. Patient does state she's had diarrhea for more than a year.  In the emergency department, patient was noted to have an elevated BNP as well as a hemoglobin of 8.8. She was found to have a positive fecal occult blood test. Patient was found to be in respiratory distress with oxygenation levels in the 80s. She was placed on supplemental oxygen. [facility] called for admission.  Review of Systems:  Constitutional: Complains of increased fatigue.  HEENT: Denies photophobia, eye pain, redness, hearing loss, ear pain, congestion, sore throat, rhinorrhea, sneezing, mouth sores, trouble swallowing, neck pain, neck stiffness and tinnitus.   Respiratory: Complains of dry cough and shortness of breath. Cardiovascular: Denies chest pain, palpitations and leg swelling.  Gastrointestinal: Has had diarrhea for over a year. Genitourinary: Denies dysuria, urgency, frequency, hematuria, flank pain and difficulty urinating.  Musculoskeletal: Denies myalgias, back pain, joint swelling, arthralgias and gait problem.  Skin: Denies pallor, rash and wound.    Neurological: Denies dizziness, seizures, syncope, weakness, light-headedness, numbness and headaches.  Hematological: Denies adenopathy. Easy bruising, personal or family bleeding history  Psychiatric/Behavioral: Denies suicidal ideation, mood changes, confusion, nervousness, sleep disturbance and agitation  Past Medical History  Diagnosis Date  . Diabetes mellitus     on Lantus 30 U   . CAD (coronary artery disease)     a. S/p CABG and stenting b. L&R cath 01/09/15 LM patent, LAD 100% occluded, LCX moderate disease, RCA 100% occluded. SVG-RCA CTO, SVG-mid LAD patent with more distal LAD 70% stenosis. Recs for medical management.   Marland Kitchen PAD (peripheral artery disease) (HCC)   . Stroke [facility])     MRI 11/2011 with remote occipital lobe. MRA with moderate left focal vertebral artery stenosis  . Chronic combined systolic and diastolic CHF (congestive heart failure) (HCC)     a. 11/2011 Echo with EF 30-35%, global hypokinesis, and inferior akinesis;  b. 12/2014 Echo: EF 50%, mod LVH, Ao sclerosis w/o stenosis, mildly dil LA, mild to mod RV dysfxn.  . Hyperlipidemia   . DDD (degenerative disc disease), lumbar   . MI (myocardial infarction) (HCC) 1997  . COPD (chronic obstructive pulmonary disease) (HCC)     a. prn and HS supplemental O2.  . Anemia   . Hypertension   . GERD (gastroesophageal reflux disease)   . History of IBS   . Hypothyroidism     Goiter  . Carotid artery occlusion   . CKD (chronic kidney disease), stage III     stage 3  . History of tracheostomy (HCC) 08/26/2013    Dr. Jenne Pane  . Shortness of breath dyspnea    Past Surgical History  Procedure Laterality  Date  . Ptca    . Thyroidectomy    . Coronary artery bypass graft      2 vessel  . Carotid endarterectomy  ~2008    Left   . Cholecystectomy    . Tracheostomy tube placement  01/02/2012  . Angioplasty  [id number]    Aortogram by Dr. [place] McKenzie [facility])  . Pr vein bypass graft,aorto-fem-pop      Right common  femoral-AK popliteal BPG & Right Popliteal-posterior tibial  . Pr vein bypass graft,aorto-fem-pop      Left Fem-pop BPG  . Carpal tunnel release Right   . Femoral-tibial bypass graft Left 06/11/2013    Procedure: BYPASS GRAFT  LEFT FEMORAL- POSTERIOR TIBIAL ARTERY/ REDO;  Surgeon: Sherren Kerns, MD;  Location: [facility] OR;  Service: Vascular;  Laterality: Left;  . Thrombectomy femoral artery Left 06/11/2013    Procedure: THROMBECTOMY FEMORAL ARTERY;  Surgeon: Sherren Kerns, MD;  Location: [facility] OR;  Service: Vascular;  Laterality: Left;  . Spine surgery  Oct. 27, 2014    Injection - Back  . Abdominal aortagram N/A 04/05/2013    Procedure: ABDOMINAL Ronny Flurry;  Surgeon: Sherren Kerns, MD;  Location: [facility] CATH LAB;  Service: Cardiovascular;  Laterality: N/A;  . Colonoscopy N/A 10/27/2014    Procedure: COLONOSCOPY;  Surgeon: Willis Modena, MD;  Location: [facility] ENDOSCOPY;  Service: Endoscopy;  Laterality: N/A;  . Left and right heart catheterization with coronary angiogram N/A 01/09/2015    Procedure: LEFT AND RIGHT HEART CATHETERIZATION WITH CORONARY ANGIOGRAM;  Surgeon: Marykay Lex, MD;  Location: [facility] CATH LAB;  Service: Cardiovascular;  Laterality: N/A;   Social History:  reports that she quit smoking about 3 years ago. Her smoking use included Cigarettes. She has a 50 pack-year smoking history. She has never used smokeless tobacco. She reports that she does not drink alcohol or use illicit drugs.   Allergies  Allergen Reactions  . Aldactone [Spironolactone] Other (See Comments)    Severe hyperkalemia   . Lisinopril Other (See Comments) and Cough    Hypotension also  . Crestor [Rosuvastatin Calcium] Other (See Comments)    Muscle Pain  . Vicodin [Hydrocodone-Acetaminophen] Nausea And Vomiting    Family History  Problem Relation Age of Onset  . Hyperlipidemia Mother   . Other Mother     AAA  . Alzheimer's disease Mother   . Heart disease Mother   . Irregular heart beat Mother   .  Diabetes Daughter   . Hypertension Daughter     Prior to Admission medications   Medication Sig Start Date End Date Taking? Authorizing Provider  aspirin EC 81 MG tablet Take 81 mg by mouth daily.   Yes Historical Provider, MD  carvedilol (COREG) 25 MG tablet Take 25 mg by mouth 2 (two) times daily with a meal. 11/28/11  Yes Danley Danker, MD  clopidogrel (PLAVIX) 75 MG tablet Take 75 mg by mouth daily. 11/28/11  Yes Danley Danker, MD  dicyclomine (BENTYL) 20 MG tablet Take 20 mg by mouth 3 (three) times daily before meals.   Yes Historical Provider, MD  DULoxetine (CYMBALTA) 60 MG capsule Take 60 mg by mouth daily. 08/06/14  Yes Historical Provider, MD  ferrous sulfate 325 (65 FE) MG tablet Take 1 tablet (325 mg total) by mouth 2 (two) times daily with a meal. Patient taking differently: Take 325 mg by mouth daily.  10/29/14  Yes Starleen Arms, MD  furosemide (LASIX) 40 MG tablet Take 2 tablets (  80 mg total) by mouth as directed. Starting 07/31/15: 80 mg in the morning and 40 mg in the afternoon 09/11/15  Yes Scott T Weaver, PA-C  gabapentin (NEURONTIN) 300 MG capsule Take 300 mg by mouth at bedtime.   Yes Historical Provider, MD  guaiFENesin (ROBITUSSIN) 100 MG/5ML SOLN Take 5 mLs (100 mg total) by mouth every 4 (four) hours as needed for cough or to loosen phlegm. 07/30/15  Yes Osvaldo Shipper, MD  insulin aspart (NOVOLOG) 100 UNIT/ML injection Inject 12-16 Units into the skin 3 (three) times daily with meals.   Yes Historical Provider, MD  Insulin Degludec (TRESIBA FLEXTOUCH) 100 UNIT/ML SOPN Inject 48 Units into the skin at bedtime.   Yes Historical Provider, MD  ipratropium-albuterol (DUONEB) 0.5-2.5 (3) MG/3ML SOLN Take 3 mLs by nebulization 2 (two) times daily as needed (for shortnes of breath).    Yes Historical Provider, MD  isosorbide mononitrate (IMDUR) 30 MG 24 hr tablet Take 1 tablet (30 mg total) by mouth daily. Patient taking differently: Take 15 mg by mouth daily.   02/04/15  Yes Ok Anis, NP  lansoprazole (PREVACID) 30 MG capsule TAKE 1 CAPSULE TWICE DAILY. 07/24/15  Yes Historical Provider, MD  levothyroxine (SYNTHROID, LEVOTHROID) 50 MCG tablet Take 50 mcg by mouth daily before breakfast.  02/06/15  Yes Historical Provider, MD  Multiple Vitamins-Minerals (WOMENS 50+ MULTI VITAMIN/MIN) TABS Take 1 tablet by mouth daily.   Yes Historical Provider, MD  Probiotic Product (PROBIOTIC PO) Take 1 tablet by mouth 2 (two) times daily.   Yes Historical Provider, MD  simvastatin (ZOCOR) 40 MG tablet Take 40 mg by mouth every evening.  11/28/11  Yes Danley Danker, MD  traMADol (ULTRAM) 50 MG tablet Take 50 mg by mouth every 8 (eight) hours as needed for moderate pain.  08/10/15  Yes Historical Provider, MD  glucose ([employer] GLUCOSE) 4 GM chewable tablet Chew 1 tablet by mouth as needed for low blood sugar.    Historical Provider, MD  hydrALAZINE (APRESOLINE) 25 MG tablet TAKE 1 TABLET EVERY 8 HOURS 10/28/15   Historical Provider, MD  nitroGLYCERIN (NITROSTAT) 0.4 MG SL tablet Place 0.4 mg under the tongue every 5 (five) minutes as needed. For chest pain    Historical Provider, MD  NOVOLOG FLEXPEN 100 UNIT/ML FlexPen 70 -199 TAKE 12 UNITS, 200-299 TAKE 14 UNITS, 300 OR HIGHER 16 UNITS SUBCUTANEOUS 10/01/15   Historical Provider, MD  OXYGEN-HELIUM IN Inhale 4 L into the lungs at bedtime.     Historical Provider, MD  UNABLE TO FIND Blood work: Basic metabolic Panel on 08/03/15. Dx. CKD Stage 2. Results to Dr. Anne Fu with [facility]. 07/30/15   Osvaldo Shipper, MD   Physical Exam: Filed Vitals:   11/10/15 1645 11/10/15 1715  BP: 205/90 108/82  Pulse: 81 81  Temp:    Resp: 15 21     General: Well developed, well nourished, NAD, appears stated age  HEENT: NCAT, PERRLA, EOMI, Anicteic Sclera, mucous membranes moist.   Neck: Supple, no JVD, no masses, trach collar  Cardiovascular: S1 S2 auscultated, no rubs, murmurs or gallops. Regular rate and  rhythm.  Respiratory: Scattered Rales, no wheezing  Abdomen: Soft, nontender, nondistended, + bowel sounds  Extremities: warm dry without cyanosis clubbing. 2+ edema LE B/L   Neuro: AAOx3, cranial nerves grossly intact. Strength 5/5 in patient's upper and lower extremities bilaterally  Skin: Without rashes exudates or nodules  Psych: Normal affect and demeanor with intact judgement and insight  Labs on  Admission:  Basic Metabolic Panel:  Recent Labs Lab 11/10/15 1441  NA 148*  K 3.9  CL 112*  CO2 29  GLUCOSE 74  BUN 55*  CREATININE 1.37*  CALCIUM 8.5*   Liver Function Tests: No results for input(s): AST, ALT, ALKPHOS, BILITOT, PROT, ALBUMIN in the last 168 hours. No results for input(s): LIPASE, AMYLASE in the last 168 hours. No results for input(s): AMMONIA in the last 168 hours. CBC:  Recent Labs Lab 11/10/15 1441  WBC 5.8  NEUTROABS 4.4  HGB 8.8*  HCT 29.2*  MCV 98.6  PLT 188   Cardiac Enzymes:  Recent Labs Lab 11/10/15 1441  TROPONINI 0.07*    BNP (last 3 results)  Recent Labs  01/06/15 0520 07/27/15 0744 11/10/15 1441  BNP 1061.8* 852.0* 1053.3*    ProBNP (last 3 results) No results for input(s): PROBNP in the last 8760 hours.  CBG: No results for input(s): GLUCAP in the last 168 hours.  Radiological Exams on Admission: Dg Chest 2 View  11/10/2015  CLINICAL DATA:  Shortness of breath for 2 days.  Cough for 1 week EXAM: CHEST  2 VIEW COMPARISON:  May 28, 2015 FINDINGS: There is cardiomegaly with pulmonary venous hypertension. There is generalized interstitial edema, stable. There is atelectatic change in the superior lingula, stable. No new opacity. Tracheostomy catheter tip is 3.6 cm above the carina. No pneumothorax. There is marked collapse of the L1 vertebral body, stable. IMPRESSION: Findings indicative of chronic congestive heart failure. Stable atelectatic change in the superior lingula. Overall no appreciable change compared to  prior study. Electronically Signed   By: Bretta Bang III M.D.   On: 11/10/2015 14:17    EKG: Independently reviewed. Sinus rhythm, rate 76, first-degree AV block, PR 238, T-wave inversion in lead 3 and aVF  Assessment/Plan  Acute respiratory failure with hypoxia -Patient will be admitted to stepdown -Upon arrival to the emergency department, SPO2 noted be 66%, later 88%, improved once placed on oxygen -Multifactorial, anemia versus CHF -Will continue oxygen supplementation to maintain saturations above 92%  Acute combined systolic and diastolic heart failure -Echocardiogram 07/28/2015 showed EF of 35-40%, grade 1 diastolic dysfunction -BNP 1053 -Chest x-ray: Chronic congestive heart failure -Patient has had increasing short of breath and lower extremity swelling -Will place on Lasix 60 mg IV twice a day -Monitor intake and output, daily weights  Symptomatic anemia/normocytic anemia/GI Bleed -Baseline hemoglobin approximately 9-10, currently 8.8 -Monitor H&H every 8 hours -Will place on Protonix 40 mg IV twice a day -FOBT positive -Patient had colonoscopy 10/27/2014 which showed hemorrhoidal, no ischemic colitis didn't find no excretion for diarrhea -Patient follows with [facility] -Will place patient on clears -Continue iron supplementation  Chronic elevated troponin -Appears patient has a chronically elevated troponin 0.6-0.7, currently 0.7 -EKG showed minor T-wave inversions in leads 3 and aVF -Will obtain EKG for the morning and cycle troponins -If elevation, will consult cardiology  History of tracheostomy -Continue trach care and oxygen  Chronic kidney disease, stage III -Creatinine currently 1.37, baseline approximately 1.2-1.4 -Continue monitor BMP  Essential hypertension -Continue home medication: Coreg, hydralazine  Coronary artery disease -Currently chest pain-free -Continue Plavix, Coreg, imdur  Diabetes mellitus, type II -Last hemoglobin A1c  07/27/2015 was 9.7 -Will hold her medications at this time and place on insulin sliding scale was CBG monitoring  Hypothyroidism -Continue Synthroid  Hyperlipidemia -Continue statin  Depression -Continue Cymbalta  DVT prophylaxis: SCDs  Code Status: DNR  Condition: Guarded  Family Communication: None at  bedside. Admission, patients condition and plan of care including tests being ordered have been discussed with the patient, who indicates understanding and agrees with the plan and Code Status.  Disposition Plan: Admitted to step down  Time spent: 70 minutes  Coolidge Gossard D.O. [facility] Pager [phone]  If 7PM-7AM, please contact night-coverage [email or web address] Password [facility] 11/10/2015, 5:37 PM

## 2015-11-10 NOTE — ED Notes (Signed)
Respiratory at bedside with the patient for breathing treatment.

## 2015-11-10 NOTE — ED Notes (Signed)
EMS - Patient coming from her PCP with c/o of increased SOB x 2 days with rest and exertion.  C/o of cough x 2 weeks, 2 lb weight gain with grade 2 pitting edema from the knees down.  Trachea in place prior to ED arrival.

## 2015-11-10 NOTE — ED Provider Notes (Signed)
CSN: [id number]     Arrival date & time 11/10/15  1320 History   First MD Initiated Contact with Patient 11/10/15 1325     Chief Complaint  Patient presents with  . Shortness of Breath     (Consider location/radiation/quality/duration/timing/severity/associated sxs/prior Treatment) HPI  70 year old female with a history of diabetes coronary artery disease, systolic heart failure with ejection fraction 30-35% October 2016 presents to the emergency department with a tracheostomy in place. She is here for worsening dyspnea on exertion and hypoxia at her primary care doctor's office. Further examination patient also complained 2 days of dyspnea on exertion, lower leg swelling and abdominal distention and a 2 pound weight gain. Is on Lasix at home and has not had any recent changes to that. She has had a dry cough but no fevers. No recent medication changes. Symptoms improved at rest.  Past Medical History  Diagnosis Date  . Diabetes mellitus     on Lantus 30 U   . CAD (coronary artery disease)     a. S/p CABG and stenting b. L&R cath 01/09/15 LM patent, LAD 100% occluded, LCX moderate disease, RCA 100% occluded. SVG-RCA CTO, SVG-mid LAD patent with more distal LAD 70% stenosis. Recs for medical management.   Marland Kitchen PAD (peripheral artery disease) (HCC)   . Stroke [facility])     MRI 11/2011 with remote occipital lobe. MRA with moderate left focal vertebral artery stenosis  . Chronic combined systolic and diastolic CHF (congestive heart failure) (HCC)     a. 11/2011 Echo with EF 30-35%, global hypokinesis, and inferior akinesis;  b. 12/2014 Echo: EF 50%, mod LVH, Ao sclerosis w/o stenosis, mildly dil LA, mild to mod RV dysfxn.  . Hyperlipidemia   . DDD (degenerative disc disease), lumbar   . MI (myocardial infarction) (HCC) 1997  . COPD (chronic obstructive pulmonary disease) (HCC)     a. prn and HS supplemental O2.  . Anemia   . Hypertension   . GERD (gastroesophageal reflux disease)   . History of  IBS   . Hypothyroidism     Goiter  . Carotid artery occlusion   . CKD (chronic kidney disease), stage III     stage 3  . History of tracheostomy (HCC) 08/26/2013    Dr. Jenne Pane  . Shortness of breath dyspnea    Past Surgical History  Procedure Laterality Date  . Ptca    . Thyroidectomy    . Coronary artery bypass graft      2 vessel  . Carotid endarterectomy  ~2008    Left   . Cholecystectomy    . Tracheostomy tube placement  01/02/2012  . Angioplasty  [id number]    Aortogram by Dr. Italy McKenzie [facility])  . Pr vein bypass graft,aorto-fem-pop      Right common femoral-AK popliteal BPG & Right Popliteal-posterior tibial  . Pr vein bypass graft,aorto-fem-pop      Left Fem-pop BPG  . Carpal tunnel release Right   . Femoral-tibial bypass graft Left 06/11/2013    Procedure: BYPASS GRAFT  LEFT FEMORAL- POSTERIOR TIBIAL ARTERY/ REDO;  Surgeon: Sherren Kerns, MD;  Location: [facility] OR;  Service: Vascular;  Laterality: Left;  . Thrombectomy femoral artery Left 06/11/2013    Procedure: THROMBECTOMY FEMORAL ARTERY;  Surgeon: Sherren Kerns, MD;  Location: [facility] OR;  Service: Vascular;  Laterality: Left;  . Spine surgery  Oct. 27, 2014    Injection - Back  . Abdominal aortagram N/A 04/05/2013    Procedure: ABDOMINAL AORTAGRAM;  Surgeon: Sherren Kerns, MD;  Location: [facility] CATH LAB;  Service: Cardiovascular;  Laterality: N/A;  . Colonoscopy N/A 10/27/2014    Procedure: COLONOSCOPY;  Surgeon: Willis Modena, MD;  Location: [facility] ENDOSCOPY;  Service: Endoscopy;  Laterality: N/A;  . Left and right heart catheterization with coronary angiogram N/A 01/09/2015    Procedure: LEFT AND RIGHT HEART CATHETERIZATION WITH CORONARY ANGIOGRAM;  Surgeon: Marykay Lex, MD;  Location: [facility] CATH LAB;  Service: Cardiovascular;  Laterality: N/A;   Family History  Problem Relation Age of Onset  . Hyperlipidemia Mother   . Other Mother     AAA  . Alzheimer's disease Mother   . Heart disease Mother   . Irregular  heart beat Mother   . Diabetes Daughter   . Hypertension Daughter    Social History  Substance Use Topics  . Smoking status: Former Smoker -- 1.00 packs/day for 50 years    Types: Cigarettes    Quit date: 01/24/2012  . Smokeless tobacco: Never Used  . Alcohol Use: No   OB History    No data available     Review of Systems  Constitutional: Positive for fatigue.  Respiratory: Positive for cough and shortness of breath.   Cardiovascular: Negative for chest pain.  Gastrointestinal: Negative for nausea, vomiting and abdominal pain.  Genitourinary: Negative for urgency.  All other systems reviewed and are negative.     Allergies  Aldactone; Lisinopril; Crestor; and Vicodin  Home Medications   Prior to Admission medications   Medication Sig Start Date End Date Taking? Authorizing Provider  aspirin EC 81 MG tablet Take 81 mg by mouth daily.   Yes Historical Provider, MD  carvedilol (COREG) 25 MG tablet Take 25 mg by mouth 2 (two) times daily with a meal. 11/28/11  Yes Danley Danker, MD  clopidogrel (PLAVIX) 75 MG tablet Take 75 mg by mouth daily. 11/28/11  Yes Danley Danker, MD  dicyclomine (BENTYL) 20 MG tablet Take 20 mg by mouth 3 (three) times daily before meals.   Yes Historical Provider, MD  DULoxetine (CYMBALTA) 60 MG capsule Take 60 mg by mouth daily. 08/06/14  Yes Historical Provider, MD  ferrous sulfate 325 (65 FE) MG tablet Take 1 tablet (325 mg total) by mouth 2 (two) times daily with a meal. Patient taking differently: Take 325 mg by mouth daily.  10/29/14  Yes Starleen Arms, MD  furosemide (LASIX) 40 MG tablet Take 2 tablets (80 mg total) by mouth as directed. Starting 07/31/15: 80 mg in the morning and 40 mg in the afternoon 09/11/15  Yes Scott T Weaver, PA-C  gabapentin (NEURONTIN) 300 MG capsule Take 300 mg by mouth at bedtime.   Yes Historical Provider, MD  guaiFENesin (ROBITUSSIN) 100 MG/5ML SOLN Take 5 mLs (100 mg total) by mouth every 4 (four) hours  as needed for cough or to loosen phlegm. 07/30/15  Yes Osvaldo Shipper, MD  insulin aspart (NOVOLOG) 100 UNIT/ML injection Inject 12-16 Units into the skin 3 (three) times daily with meals.   Yes Historical Provider, MD  Insulin Degludec (TRESIBA FLEXTOUCH) 100 UNIT/ML SOPN Inject 48 Units into the skin at bedtime.   Yes Historical Provider, MD  ipratropium-albuterol (DUONEB) 0.5-2.5 (3) MG/3ML SOLN Take 3 mLs by nebulization 2 (two) times daily as needed (for shortnes of breath).    Yes Historical Provider, MD  isosorbide mononitrate (IMDUR) 30 MG 24 hr tablet Take 1 tablet (30 mg total) by mouth daily. Patient taking differently: Take 15 mg  by mouth daily.  02/04/15  Yes Ok Anis, NP  lansoprazole (PREVACID) 30 MG capsule TAKE 1 CAPSULE TWICE DAILY. 07/24/15  Yes Historical Provider, MD  levothyroxine (SYNTHROID, LEVOTHROID) 50 MCG tablet Take 50 mcg by mouth daily before breakfast.  02/06/15  Yes Historical Provider, MD  Multiple Vitamins-Minerals (WOMENS 50+ MULTI VITAMIN/MIN) TABS Take 1 tablet by mouth daily.   Yes Historical Provider, MD  Probiotic Product (PROBIOTIC PO) Take 1 tablet by mouth 2 (two) times daily.   Yes Historical Provider, MD  simvastatin (ZOCOR) 40 MG tablet Take 40 mg by mouth every evening.  11/28/11  Yes Danley Danker, MD  traMADol (ULTRAM) 50 MG tablet Take 50 mg by mouth every 8 (eight) hours as needed for moderate pain.  08/10/15  Yes Historical Provider, MD  glucose ([employer] GLUCOSE) 4 GM chewable tablet Chew 1 tablet by mouth as needed for low blood sugar.    Historical Provider, MD  hydrALAZINE (APRESOLINE) 25 MG tablet TAKE 1 TABLET EVERY 8 HOURS 10/28/15   Historical Provider, MD  nitroGLYCERIN (NITROSTAT) 0.4 MG SL tablet Place 0.4 mg under the tongue every 5 (five) minutes as needed. For chest pain    Historical Provider, MD  NOVOLOG FLEXPEN 100 UNIT/ML FlexPen 70 -199 TAKE 12 UNITS, 200-299 TAKE 14 UNITS, 300 OR HIGHER 16 UNITS SUBCUTANEOUS 10/01/15    Historical Provider, MD  OXYGEN-HELIUM IN Inhale 4 L into the lungs at bedtime.     Historical Provider, MD  UNABLE TO FIND Blood work: Basic metabolic Panel on 08/03/15. Dx. CKD Stage 2. Results to Dr. Anne Fu with [facility]. 07/30/15   Osvaldo Shipper, MD   BP 181/88 mmHg  Pulse 80  Temp(Src) 98.1 F (36.7 C) (Oral)  Resp 17  SpO2 92% Physical Exam  Constitutional: She is oriented to person, place, and time. She appears well-developed and well-nourished. She appears distressed.  HENT:  Head: Normocephalic and atraumatic.  Neck: Normal range of motion.  Cardiovascular: Normal rate and regular rhythm.   Pulmonary/Chest: No stridor. She is in respiratory distress. She has rales. She exhibits no tenderness.  Abdominal: She exhibits no distension. There is no rebound and no guarding.  Genitourinary: Guaiac positive stool.  Musculoskeletal: Normal range of motion. She exhibits edema (2+ to the knees.). She exhibits no tenderness.  Neurological: She is alert and oriented to person, place, and time.  Skin: Skin is warm and dry. No rash noted.  Nursing note and vitals reviewed.   ED Course  Procedures (including critical care time)  CRITICAL CARE Performed by: Marily Memos  Total critical care time: 30 minutes Critical care time was exclusive of separately billable procedures and treating other patients. Critical care was necessary to treat or prevent imminent or life-threatening deterioration. Critical care was time spent personally by me on the following activities: development of treatment plan with patient and/or surrogate as well as nursing, discussions with consultants, evaluation of patient's response to treatment, examination of patient, obtaining history from patient or surrogate, ordering and performing treatments and interventions, ordering and review of laboratory studies, ordering and review of radiographic studies, pulse oximetry and re-evaluation of patient's  condition.   Labs Review Labs Reviewed  CBC WITH DIFFERENTIAL/PLATELET - Abnormal; Notable for the following:    RBC 2.96 (*)    Hemoglobin 8.8 (*)    HCT 29.2 (*)    RDW 16.7 (*)    Lymphs Abs 0.6 (*)    All other components within normal limits  BASIC METABOLIC  PANEL - Abnormal; Notable for the following:    Sodium 148 (*)    Chloride 112 (*)    BUN 55 (*)    Creatinine, Ser 1.37 (*)    Calcium 8.5 (*)    GFR calc non Af Amer 38 (*)    GFR calc Af Amer 44 (*)    All other components within normal limits  TROPONIN I - Abnormal; Notable for the following:    Troponin I 0.07 (*)    All other components within normal limits  BRAIN NATRIURETIC PEPTIDE - Abnormal; Notable for the following:    B Natriuretic Peptide 1053.3 (*)    All other components within normal limits  POC OCCULT BLOOD, ED - Abnormal; Notable for the following:    Fecal Occult Bld POSITIVE (*)    All other components within normal limits  OCCULT BLOOD X 1 CARD TO LAB, STOOL  TYPE AND SCREEN    Imaging Review Dg Chest 2 View  11/10/2015  CLINICAL DATA:  Shortness of breath for 2 days.  Cough for 1 week EXAM: CHEST  2 VIEW COMPARISON:  May 28, 2015 FINDINGS: There is cardiomegaly with pulmonary venous hypertension. There is generalized interstitial edema, stable. There is atelectatic change in the superior lingula, stable. No new opacity. Tracheostomy catheter tip is 3.6 cm above the carina. No pneumothorax. There is marked collapse of the L1 vertebral body, stable. IMPRESSION: Findings indicative of chronic congestive heart failure. Stable atelectatic change in the superior lingula. Overall no appreciable change compared to prior study. Electronically Signed   By: Bretta Bang III M.D.   On: 11/10/2015 14:17   I have personally reviewed and evaluated these images and lab results as part of my medical decision-making.   EKG Interpretation   Date/Time:  Tuesday November 10 2015 14:29:46 EST Ventricular  Rate:  76 PR Interval:  238 QRS Duration: 100 QT Interval:  418 QTC Calculation: 470 R Axis:   22 Text Interpretation:  Sinus rhythm with marked sinus arrhythmia with 1st  degree A-V block with Possible Premature atrial complexes with Abberant  conduction Septal infarct , age undetermined ST \\T \ T wave abnormality,  consider inferolateral ischemia Abnormal ECG Confirmed by [facility] MD, Barbara Cower  [phone]) on 11/10/2015 2:47:38 PM      MDM   Final diagnoses:  Acute on chronic clinical systolic heart failure (HCC)  Acute on chronic respiratory failure with hypoxia (HCC)  Anemia, unspecified anemia type  Positive occult stool blood test    70 year old female with multiple comorbidities including tracheostomy, CHF, coronary artery disease is on persistent oxygen at home presents to the ED with likely CHF exacerbation. However found to have anemia as well, Hemoccult positive which could be contributing to her symptoms. Here patient with crackles in bilateral lower lobes. Intermittent oxygenation's around 87 and 89%. Given duo nebs which seems to improve these, however has no history of COPD so will not start steroids or antibiotics at this time. We'll give a dose of Lasix and see if this improves her symptoms. Not hypotensive or significantly tachycardic so transfusion not started in ED but type/screen done. Will admit to medicine for further management and workup.    Marily Memos, MD 11/11/15 [phone]

## 2015-11-10 NOTE — ED Notes (Signed)
Respiratory at bedside with patient, O2 between 86-90% on 4L.

## 2015-11-11 ENCOUNTER — Encounter: Payer: Self-pay | Admitting: *Deleted

## 2015-11-11 ENCOUNTER — Ambulatory Visit: Payer: Medicare Other | Admitting: Cardiology

## 2015-11-11 ENCOUNTER — Ambulatory Visit: Payer: Medicare Other | Admitting: *Deleted

## 2015-11-11 ENCOUNTER — Inpatient Hospital Stay (HOSPITAL_COMMUNITY): Payer: Medicare Other

## 2015-11-11 DIAGNOSIS — E785 Hyperlipidemia, unspecified: Secondary | ICD-10-CM

## 2015-11-11 DIAGNOSIS — E1165 Type 2 diabetes mellitus with hyperglycemia: Secondary | ICD-10-CM

## 2015-11-11 DIAGNOSIS — R9431 Abnormal electrocardiogram [ECG] [EKG]: Secondary | ICD-10-CM

## 2015-11-11 DIAGNOSIS — E1129 Type 2 diabetes mellitus with other diabetic kidney complication: Secondary | ICD-10-CM

## 2015-11-11 DIAGNOSIS — J9601 Acute respiratory failure with hypoxia: Secondary | ICD-10-CM

## 2015-11-11 LAB — CBC
HCT: 29.8 % — ABNORMAL LOW (ref 36.0–46.0)
Hemoglobin: 9.2 g/dL — ABNORMAL LOW (ref 12.0–15.0)
MCH: 30.1 pg (ref 26.0–34.0)
MCHC: 30.9 g/dL (ref 30.0–36.0)
MCV: 97.4 fL (ref 78.0–100.0)
PLATELETS: 185 10*3/uL (ref 150–400)
RBC: 3.06 MIL/uL — AB (ref 3.87–5.11)
RDW: 16.7 % — AB (ref 11.5–15.5)
WBC: 5.7 10*3/uL (ref 4.0–10.5)

## 2015-11-11 LAB — HEMOGLOBIN AND HEMATOCRIT, BLOOD
HCT: 30.2 % — ABNORMAL LOW (ref 36.0–46.0)
HCT: 29.8 % — ABNORMAL LOW (ref 36.0–46.0)
HEMOGLOBIN: 9.3 g/dL — AB (ref 12.0–15.0)
Hemoglobin: 9 g/dL — ABNORMAL LOW (ref 12.0–15.0)

## 2015-11-11 LAB — COMPREHENSIVE METABOLIC PANEL
ALBUMIN: 3.2 g/dL — AB (ref 3.5–5.0)
ALK PHOS: 76 U/L (ref 38–126)
ALT: 37 U/L (ref 14–54)
AST: 25 U/L (ref 15–41)
Anion gap: 12 (ref 5–15)
BUN: 43 mg/dL — ABNORMAL HIGH (ref 6–20)
CHLORIDE: 107 mmol/L (ref 101–111)
CO2: 28 mmol/L (ref 22–32)
CREATININE: 1.22 mg/dL — AB (ref 0.44–1.00)
Calcium: 8.5 mg/dL — ABNORMAL LOW (ref 8.9–10.3)
GFR calc non Af Amer: 44 mL/min — ABNORMAL LOW (ref >60)
GFR, EST AFRICAN AMERICAN: 51 mL/min — AB (ref >60)
GLUCOSE: 69 mg/dL (ref 65–99)
Potassium: 3.1 mmol/L — ABNORMAL LOW (ref 3.5–5.1)
SODIUM: 147 mmol/L — AB (ref 135–145)
Total Bilirubin: 0.5 mg/dL (ref 0.3–1.2)
Total Protein: 5.8 g/dL — ABNORMAL LOW (ref 6.5–8.1)

## 2015-11-11 LAB — GLUCOSE, CAPILLARY
GLUCOSE-CAPILLARY: 64 mg/dL — AB (ref 65–99)
GLUCOSE-CAPILLARY: 141 mg/dL — AB (ref 65–99)
GLUCOSE-CAPILLARY: 233 mg/dL — AB (ref 65–99)
Glucose-Capillary: 148 mg/dL — ABNORMAL HIGH (ref 65–99)

## 2015-11-11 LAB — MAGNESIUM: MAGNESIUM: 2.3 mg/dL (ref 1.7–2.4)

## 2015-11-11 LAB — TROPONIN I
TROPONIN I: 0.09 ng/mL — AB (ref <0.031)
Troponin I: 0.09 ng/mL — ABNORMAL HIGH (ref <0.031)

## 2015-11-11 MED ORDER — LOPERAMIDE HCL 2 MG PO CAPS
2.0000 mg | ORAL_CAPSULE | Freq: Three times a day (TID) | ORAL | Status: DC | PRN
Start: 2015-11-11 — End: 2015-11-14
  Administered 2015-11-11: 2 mg via ORAL
  Filled 2015-11-11: qty 1

## 2015-11-11 MED ORDER — POTASSIUM CHLORIDE 20 MEQ/15ML (10%) PO SOLN
40.0000 meq | Freq: Once | ORAL | Status: AC
  Administered 2015-11-11: 40 meq via ORAL
  Filled 2015-11-11: qty 30

## 2015-11-11 MED ORDER — CETYLPYRIDINIUM CHLORIDE 0.05 % MT LIQD
7.0000 mL | Freq: Two times a day (BID) | OROMUCOSAL | Status: DC
  Administered 2015-11-11 – 2015-11-19 (×15): 7 mL via OROMUCOSAL

## 2015-11-11 MED ORDER — IPRATROPIUM-ALBUTEROL 0.5-2.5 (3) MG/3ML IN SOLN
3.0000 mL | RESPIRATORY_TRACT | Status: DC | PRN
  Administered 2015-11-12: 3 mL via RESPIRATORY_TRACT
  Filled 2015-11-11: qty 3

## 2015-11-11 MED ORDER — ASPIRIN EC 81 MG PO TBEC
81.0000 mg | DELAYED_RELEASE_TABLET | Freq: Every day | ORAL | Status: DC
  Administered 2015-11-11 – 2015-11-19 (×9): 81 mg via ORAL
  Filled 2015-11-11 (×9): qty 1

## 2015-11-11 MED ORDER — SACCHAROMYCES BOULARDII 250 MG PO CAPS
250.0000 mg | ORAL_CAPSULE | Freq: Two times a day (BID) | ORAL | Status: DC
  Administered 2015-11-11 – 2015-11-19 (×16): 250 mg via ORAL
  Filled 2015-11-11 (×16): qty 1

## 2015-11-11 NOTE — Progress Notes (Signed)
Utilization Review Completed.  

## 2015-11-11 NOTE — Progress Notes (Signed)
[facility]                                                                              Patient Demographics  [name], is a 70 y.o. female, DOB - February 07, 1946, ZOX:[id number]  Admit date - 11/10/2015   Admitting Physician Edsel Petrin, DO  Outpatient Primary MD for the patient is Cala Bradford, MD  LOS - 1   Chief Complaint  Patient presents with  . Shortness of Breath      Interim history 70 year old female with history of heart failure, diabetes, respiratory failure status post tracheostomy presented to emergency department with complaints of shortness of breath increasing over the last week. Patient found to have acute on chronic respiratory failure likely secondary to CHF exacerbation versus anemia. Hemoglobin has remained stable, unlikely to be a GI bleed. Continue diuresis  Assessment & Plan   Acute respiratory failure with hypoxia -Upon arrival to the emergency department, SPO2 noted be 66%, later 88%, improved once placed on oxygen -Multifactorial, anemia versus CHF -Will continue oxygen supplementation to maintain saturations above 92% -CXR 11/11/15: moderate pulm edema/CHF  Acute combined systolic and diastolic heart failure -Echocardiogram 07/28/2015 showed EF of 35-40%, grade 1 diastolic dysfunction -BNP 1053 -Chest x-ray: Chronic congestive heart failure -Patient has had increasing short of breath and lower extremity swelling -Continue Lasix 60 mg IV twice a day -Monitor intake and output, daily weights -UOP since admission 1250cc  Symptomatic anemia/normocytic anemia/GI Bleed -Baseline hemoglobin approximately 9-10, currently 9 -Monitor H&H every 8 hours -FOBT positive -Patient had colonoscopy 10/27/2014 which showed hemorrhoidal, no ischemic colitis didn't find no excretion for diarrhea -Spoke with [facility] on 11/10/15, suggested continuing to monitor H/H, if drop, formal consult -Suspect hemorrhoidal bleed as in the past -Continue iron  supplementation -Will advance diet to soft  Chronic elevated troponin -Appears patient has a chronically elevated troponin 0.6-0.7, currently 0.7 -EKG showed minor T-wave inversions in leads 3 and aVF -Peaked at 0.09  History of tracheostomy -Continue trach care and oxygen  Chronic kidney disease, stage III -Creatinine currently 1.22, baseline approximately 1.2-1.4 -Continue monitor BMP  Essential hypertension -Continue home medication: Coreg, hydralazine  Coronary artery disease -Currently chest pain-free -Continue Plavix, Coreg, imdur  Diabetes mellitus, type II -Last hemoglobin A1c 07/27/2015 was 9.7 -Will hold her medications at this time and place on insulin sliding scale was CBG monitoring  Hypothyroidism -Continue Synthroid  Hyperlipidemia -Continue statin  Depression -Continue Cymbalta  Diarrhea -continue probiotics -Has been ongoing for more than 1 year  Code Status: DNR  Family Communication: None at bedside  Disposition Plan: Admitted. Continue to monitor in step down due to respiratory failure.  Time Spent in minutes   30 minutes  Procedures  None  Consults   none  DVT Prophylaxis  SCDs  Lab Results  Component Value Date   PLT 185 11/11/2015    Medications  Scheduled Meds: . antiseptic oral rinse  7 mL Mouth Rinse BID  . aspirin EC  81 mg Oral Daily  . carvedilol  25 mg Oral BID WC  . clopidogrel  75 mg Oral Daily  . dicyclomine  20 mg Oral TID AC  . DULoxetine  60  mg Oral Daily  . ferrous sulfate  325 mg Oral Daily  . furosemide  60 mg Intravenous Q12H  . hydrALAZINE  25 mg Oral 3 times per day  . insulin aspart  0-9 Units Subcutaneous TID WC  . isosorbide mononitrate  15 mg Oral Daily  . levothyroxine  50 mcg Oral QAC breakfast  . multivitamin with minerals  1 tablet Oral Daily  . pantoprazole (PROTONIX) IV  40 mg Intravenous Q12H  . simvastatin  40 mg Oral QPM  . sodium chloride  3 mL Intravenous Q12H   Continuous  Infusions:  PRN Meds:.sodium chloride, acetaminophen **OR** acetaminophen, ipratropium-albuterol, morphine injection, ondansetron **OR** ondansetron (ZOFRAN) IV, sodium chloride  Antibiotics    Anti-infectives    None     Subjective:   [name] seen and examined today.  Patient continues to complain of shortness of breath and diarrhea. Denies chest pain, abdominal pain.   Objective:   Filed Vitals:   11/11/15 0838 11/11/15 1200 11/11/15 1211 11/11/15 1341  BP:  141/59  141/59  Pulse: 68 65 65   Temp:  97.8 F (36.6 C)    TempSrc:  Oral    Resp: Height:      Weight:      SpO2: 94% 91% 92%     Wt Readings from Last 3 Encounters:  11/10/15 83.689 kg (184 lb 8 oz)  11/04/15 83.462 kg (184 lb)  10/16/15 83.008 kg (183 lb)     Intake/Output Summary (Last 24 hours) at 11/11/15 1354 Last data filed at 11/11/15 1119  Gross per 24 hour  Intake    483 ml  Output   1375 ml  Net   -892 ml    Exam  General: Well developed, well nourished, NAD, appears stated age  HEENT: NCAT, mucous membranes moist.   Neck:Trach  Cardiovascular: S1 S2 auscultated, no rubs, murmurs or gallops. Regular rate and rhythm.  Respiratory: Diminished but clear anteriorly  Abdomen: Soft, nontender, nondistended, + bowel sounds  Extremities: warm dry without cyanosis clubbing. L>R RLE edema  Neuro: AAOx3, nonfocal  Psych: Normal affect and demeanor with intact judgement and insight  Data Review   Micro Results Recent Results (from the past 240 hour(s))  MRSA PCR Screening     Status: None   Collection Time: 11/10/15  8:11 PM  Result Value Ref Range Status   MRSA by PCR NEGATIVE NEGATIVE Final    Comment:        The GeneXpert MRSA Assay (FDA approved for NASAL specimens only), is one component of a comprehensive MRSA colonization surveillance program. It is not intended to diagnose MRSA infection nor to guide or monitor treatment for MRSA infections.      Radiology Reports Dg Chest 2 View  11/11/2015  CLINICAL DATA:  Shortness of breath for 2 days. Has tracheostomy tube. EXAM: CHEST  2 VIEW COMPARISON:  11/10/2015 FINDINGS: Tracheostomy tube remains in stable position. Prior median sternotomy. Cardiomegaly with bilateral interstitial and alveolar opacities most compatible with edema/CHF no change. No significant effusions. IMPRESSION: Moderate pulmonary edema/CHF. Electronically Signed   By: Charlett Nose M.D.   On: 11/11/2015 08:20   Dg Chest 2 View  11/10/2015  CLINICAL DATA:  Shortness of breath for 2 days.  Cough for 1 week EXAM: CHEST  2 VIEW COMPARISON:  May 28, 2015 FINDINGS: There is cardiomegaly with pulmonary venous hypertension. There is generalized interstitial edema, stable. There is atelectatic change in the superior lingula, stable.  No new opacity. Tracheostomy catheter tip is 3.6 cm above the carina. No pneumothorax. There is marked collapse of the L1 vertebral body, stable. IMPRESSION: Findings indicative of chronic congestive heart failure. Stable atelectatic change in the superior lingula. Overall no appreciable change compared to prior study. Electronically Signed   By: Bretta Bang III M.D.   On: 11/10/2015 14:17    CBC  Recent Labs Lab 11/10/15 1441 11/10/15 2227 11/11/15 0256 11/11/15 0459 11/11/15 1206  WBC 5.8  --  5.7  --   --   HGB 8.8* 9.3* 9.2* 9.3* 9.0*  HCT 29.2* 31.2* 29.8* 30.2* 29.8*  PLT 188  --  185  --   --   MCV 98.6  --  97.4  --   --   MCH 29.7  --  30.1  --   --   MCHC 30.1  --  30.9  --   --   RDW 16.7*  --  16.7*  --   --   LYMPHSABS 0.6*  --   --   --   --   MONOABS 0.6  --   --   --   --   EOSABS 0.2  --   --   --   --   BASOSABS 0.0  --   --   --   --     Chemistries   Recent Labs Lab 11/10/15 1441 11/10/15 2227 11/11/15 0256 11/11/15 0654  NA 148*  --  147*  --   K 3.9  --  3.1*  --   CL 112*  --  107  --   CO2 29  --  28  --   GLUCOSE 74  --  69  --   BUN 55*  --   43*  --   CREATININE 1.37*  --  1.22*  --   CALCIUM 8.5*  --  8.5*  --   MG  --  2.2  --  2.3  AST  --   --  25  --   ALT  --   --  37  --   ALKPHOS  --   --  76  --   BILITOT  --   --  0.5  --    ------------------------------------------------------------------------------------------------------------------ estimated creatinine clearance is 43.6 mL/min (by C-G formula based on Cr of 1.22). ------------------------------------------------------------------------------------------------------------------ No results for input(s): HGBA1C in the last 72 hours. ------------------------------------------------------------------------------------------------------------------ No results for input(s): CHOL, HDL, LDLCALC, TRIG, CHOLHDL, LDLDIRECT in the last 72 hours. ------------------------------------------------------------------------------------------------------------------ No results for input(s): TSH, T4TOTAL, T3FREE, THYROIDAB in the last 72 hours.  Invalid input(s): FREET3 ------------------------------------------------------------------------------------------------------------------ No results for input(s): VITAMINB12, FOLATE, FERRITIN, TIBC, IRON, RETICCTPCT in the last 72 hours.  Coagulation profile No results for input(s): INR, PROTIME in the last 168 hours.  No results for input(s): DDIMER in the last 72 hours.  Cardiac Enzymes  Recent Labs Lab 11/10/15 2227 11/11/15 0256 11/11/15 0940  TROPONINI 0.09* 0.09* 0.09*   ------------------------------------------------------------------------------------------------------------------ Invalid input(s): POCBNP    Clement Deneault D.O. on 11/11/2015 at 1:54 PM  Between 7am to 7pm - Pager - [phone]  After 7pm go to [email or web address] - password [email or web address]  And look for the night coverage person covering for me after hours  [facility] Office  [phone]

## 2015-11-11 NOTE — Plan of Care (Signed)
Problem: Safety: Goal: Ability to remain free from injury will improve Outcome: Progressing Pt understands to call for help when getting out of bed. Patient educated about fall prevention. Bedside commode at bedside. Pt verbalized understanding.

## 2015-11-12 ENCOUNTER — Ambulatory Visit: Payer: Self-pay | Admitting: *Deleted

## 2015-11-12 ENCOUNTER — Telehealth: Payer: Self-pay | Admitting: Internal Medicine

## 2015-11-12 LAB — BASIC METABOLIC PANEL
ANION GAP: 9 (ref 5–15)
BUN: 32 mg/dL — ABNORMAL HIGH (ref 6–20)
CHLORIDE: 105 mmol/L (ref 101–111)
CO2: 30 mmol/L (ref 22–32)
Calcium: 8.5 mg/dL — ABNORMAL LOW (ref 8.9–10.3)
Creatinine, Ser: 1.37 mg/dL — ABNORMAL HIGH (ref 0.44–1.00)
GFR, EST AFRICAN AMERICAN: 44 mL/min — AB (ref >60)
GFR, EST NON AFRICAN AMERICAN: 38 mL/min — AB (ref >60)
Glucose, Bld: 217 mg/dL — ABNORMAL HIGH (ref 65–99)
POTASSIUM: 4 mmol/L (ref 3.5–5.1)
SODIUM: 144 mmol/L (ref 135–145)

## 2015-11-12 LAB — CBC
HCT: 30.6 % — ABNORMAL LOW (ref 36.0–46.0)
HEMOGLOBIN: 9.3 g/dL — AB (ref 12.0–15.0)
MCH: 29.7 pg (ref 26.0–34.0)
MCHC: 30.4 g/dL (ref 30.0–36.0)
MCV: 97.8 fL (ref 78.0–100.0)
PLATELETS: 198 10*3/uL (ref 150–400)
RBC: 3.13 MIL/uL — AB (ref 3.87–5.11)
RDW: 16.8 % — ABNORMAL HIGH (ref 11.5–15.5)
WBC: 4.9 10*3/uL (ref 4.0–10.5)

## 2015-11-12 LAB — GLUCOSE, CAPILLARY
GLUCOSE-CAPILLARY: 228 mg/dL — AB (ref 65–99)
GLUCOSE-CAPILLARY: 180 mg/dL — AB (ref 65–99)
GLUCOSE-CAPILLARY: 199 mg/dL — AB (ref 65–99)
Glucose-Capillary: 178 mg/dL — ABNORMAL HIGH (ref 65–99)
Glucose-Capillary: 123 mg/dL — ABNORMAL HIGH (ref 65–99)

## 2015-11-12 MED ORDER — PANTOPRAZOLE SODIUM 40 MG PO TBEC
40.0000 mg | DELAYED_RELEASE_TABLET | Freq: Two times a day (BID) | ORAL | Status: DC
  Administered 2015-11-12 – 2015-11-13 (×4): 40 mg via ORAL
  Filled 2015-11-12 (×5): qty 1

## 2015-11-12 MED ORDER — CEPASTAT 14.5 MG MT LOZG
1.0000 | LOZENGE | OROMUCOSAL | Status: DC | PRN
  Filled 2015-11-12 (×2): qty 9

## 2015-11-12 MED ORDER — INSULIN GLARGINE 100 UNIT/ML ~~LOC~~ SOLN
30.0000 [IU] | Freq: Every day | SUBCUTANEOUS | Status: DC
  Administered 2015-11-12 – 2015-11-13 (×2): 30 [IU] via SUBCUTANEOUS
  Filled 2015-11-12 (×3): qty 0.3

## 2015-11-12 MED ORDER — GUAIFENESIN ER 600 MG PO TB12
600.0000 mg | ORAL_TABLET | Freq: Two times a day (BID) | ORAL | Status: DC
  Administered 2015-11-12 – 2015-11-19 (×15): 600 mg via ORAL
  Filled 2015-11-12 (×16): qty 1

## 2015-11-12 NOTE — Progress Notes (Signed)
Pt sat 85% on 28% ATC, increased to 35%, sat 93%. RN aware

## 2015-11-12 NOTE — Progress Notes (Signed)
[facility]                                                                              Patient Demographics  [name], is a 70 y.o. female, DOB - Jan 07, 1946, ZOX:[id number]  Admit date - 11/10/2015   Admitting Physician Edsel Petrin, DO  Outpatient Primary MD for the patient is Cala Bradford, MD  LOS - 2   Chief Complaint  Patient presents with  . Shortness of Breath      Interim history 70 year old female with history of heart failure, diabetes, respiratory failure status post tracheostomy presented to emergency department with complaints of shortness of breath increasing over the last week. Patient found to have acute on chronic respiratory failure likely secondary to CHF exacerbation versus anemia. Hemoglobin has remained stable, unlikely to be a GI bleed. Continue diuresis  Assessment & Plan   Acute respiratory failure with hypoxia -Upon arrival to the emergency department, SPO2 noted be 66%, later 88%, improved once placed on oxygen -Multifactorial, anemia versus CHF -Will continue oxygen supplementation to maintain saturations above 92% -CXR 11/11/15: moderate pulm edema/CHF   Acute combined systolic and diastolic heart failure -Echocardiogram 07/28/2015 showed EF of 35-40%, grade 1 diastolic dysfunction -BNP 1053 -Chest x-ray: Chronic congestive heart failure -Patient has had increasing short of breath and lower extremity swelling -Continue Lasix 60 mg IV twice a day -Monitor intake and output, daily weights -1/19 urine output 1.5liter last 24hrs, on 5liter oxygen, persistent crackles, lower extremity edema, continue diuresis, wean oxygen as tolerated  Symptomatic anemia/normocytic anemia/GI Bleed -Baseline hemoglobin approximately 9-10, currently 9 -Monitor H&H every 8 hours -FOBT positive -Patient had colonoscopy 10/27/2014 which showed hemorrhoidal, no ischemic colitis didn't find no excretion for diarrhea -Spoke with [facility] on 11/10/15,  suggested continuing to monitor H/H, if drop, formal consult -Suspect hemorrhoidal bleed as in the past -Continue iron supplementation -tolerating soft diet, hgb stable  Chronic elevated troponin -Appears patient has a chronically elevated troponin 0.6-0.7, currently 0.7 -EKG showed minor T-wave inversions in leads 3 and aVF -Peaked at 0.09  History of tracheostomy -Continue trach care and oxygen  Chronic kidney disease, stage III -Creatinine currently 1.22, baseline approximately 1.2-1.4 -Continue monitor BMP  Essential hypertension -Continue home medication: Coreg, hydralazine  Coronary artery disease -Currently chest pain-free -Continue Plavix, Coreg, imdur  Diabetes mellitus, type II -Last hemoglobin A1c 07/27/2015 was 9.7 -home insulin regimen held since admission, bg 217 1/19 am, start long acting insulin , continue  sliding scale, continue adjust insulin  Hypothyroidism -Continue Synthroid  Hyperlipidemia -Continue statin  Depression -Continue Cymbalta  Diarrhea -continue probiotics -Has been ongoing for more than 1 year  Code Status: DNR  Family Communication: None at bedside  Disposition Plan: Admitted. Continue to monitor in step down due to respiratory failure.  Time Spent in minutes   30 minutes  Procedures  None  Consults   none  DVT Prophylaxis  SCDs  Lab Results  Component Value Date   PLT 198 11/12/2015    Medications  Scheduled Meds: . antiseptic oral rinse  7 mL Mouth Rinse BID  . aspirin EC  81 mg Oral Daily  . carvedilol  25 mg Oral BID  WC  . clopidogrel  75 mg Oral Daily  . dicyclomine  20 mg Oral TID AC  . DULoxetine  60 mg Oral Daily  . ferrous sulfate  325 mg Oral Daily  . furosemide  60 mg Intravenous Q12H  . hydrALAZINE  25 mg Oral 3 times per day  . insulin aspart  0-9 Units Subcutaneous TID WC  . isosorbide mononitrate  15 mg Oral Daily  . levothyroxine  50 mcg Oral QAC breakfast  . multivitamin with minerals  1  tablet Oral Daily  . pantoprazole  40 mg Oral BID  . saccharomyces boulardii  250 mg Oral BID  . simvastatin  40 mg Oral QPM  . sodium chloride  3 mL Intravenous Q12H   Continuous Infusions:  PRN Meds:.sodium chloride, acetaminophen **OR** acetaminophen, ipratropium-albuterol, loperamide, morphine injection, ondansetron **OR** ondansetron (ZOFRAN) IV, sodium chloride  Antibiotics    Anti-infectives    None     Subjective:   Sherrie Marsan seen and examined today.  Patient continues to complain of shortness of breath , nonproductive cough, lower extremity edema,  She also reported chronic diarrheax 63yr. Denies chest pain, abdominal pain.   Objective:   Filed Vitals:   11/12/15 0425 11/12/15 0634 11/12/15 0728 11/12/15 0737  BP: 162/81     Pulse:    90  Temp: 97.9 F (36.6 C)     TempSrc: Oral     Resp: 17   19  Height:      Weight:  83.3 kg (183 lb 10.3 oz)    SpO2: 90%  92% 93%    Wt Readings from Last 3 Encounters:  11/12/15 83.3 kg (183 lb 10.3 oz)  11/04/15 83.462 kg (184 lb)  10/16/15 83.008 kg (183 lb)     Intake/Output Summary (Last 24 hours) at 11/12/15 1610 Last data filed at 11/12/15 0425  Gross per 24 hour  Intake    483 ml  Output   1550 ml  Net  -1067 ml    Exam  General: Well developed, well nourished, NAD, appears stated age  HEENT: NCAT, mucous membranes moist.   Neck:Trach  Cardiovascular: S1 S2 auscultated, no rubs, murmurs or gallops. Regular rate and rhythm.  Respiratory: bibasilar crackles, no wheezing, no rhonchi  Abdomen: Soft, nontender, nondistended, + bowel sounds  Extremities: warm dry without cyanosis clubbing. L>R RLE edema  Neuro: AAOx3, nonfocal  Psych: Normal affect and demeanor with intact judgement and insight  Data Review   Micro Results Recent Results (from the past 240 hour(s))  MRSA PCR Screening     Status: None   Collection Time: 11/10/15  8:11 PM  Result Value Ref Range Status   MRSA by PCR NEGATIVE  NEGATIVE Final    Comment:        The GeneXpert MRSA Assay (FDA approved for NASAL specimens only), is one component of a comprehensive MRSA colonization surveillance program. It is not intended to diagnose MRSA infection nor to guide or monitor treatment for MRSA infections.     Radiology Reports Dg Chest 2 View  11/11/2015  CLINICAL DATA:  Shortness of breath for 2 days. Has tracheostomy tube. EXAM: CHEST  2 VIEW COMPARISON:  11/10/2015 FINDINGS: Tracheostomy tube remains in stable position. Prior median sternotomy. Cardiomegaly with bilateral interstitial and alveolar opacities most compatible with edema/CHF no change. No significant effusions. IMPRESSION: Moderate pulmonary edema/CHF. Electronically Signed   By: Charlett Nose M.D.   On: 11/11/2015 08:20   Dg Chest 2 View  11/10/2015  CLINICAL DATA:  Shortness of breath for 2 days.  Cough for 1 week EXAM: CHEST  2 VIEW COMPARISON:  May 28, 2015 FINDINGS: There is cardiomegaly with pulmonary venous hypertension. There is generalized interstitial edema, stable. There is atelectatic change in the superior lingula, stable. No new opacity. Tracheostomy catheter tip is 3.6 cm above the carina. No pneumothorax. There is marked collapse of the L1 vertebral body, stable. IMPRESSION: Findings indicative of chronic congestive heart failure. Stable atelectatic change in the superior lingula. Overall no appreciable change compared to prior study. Electronically Signed   By: Bretta Bang III M.D.   On: 11/10/2015 14:17    CBC  Recent Labs Lab 11/10/15 1441 11/10/15 2227 11/11/15 0256 11/11/15 0459 11/11/15 1206 11/12/15 0221  WBC 5.8  --  5.7  --   --  4.9  HGB 8.8* 9.3* 9.2* 9.3* 9.0* 9.3*  HCT 29.2* 31.2* 29.8* 30.2* 29.8* 30.6*  PLT 188  --  185  --   --  198  MCV 98.6  --  97.4  --   --  97.8  MCH 29.7  --  30.1  --   --  29.7  MCHC 30.1  --  30.9  --   --  30.4  RDW 16.7*  --  16.7*  --   --  16.8*  LYMPHSABS 0.6*  --   --    --   --   --   MONOABS 0.6  --   --   --   --   --   EOSABS 0.2  --   --   --   --   --   BASOSABS 0.0  --   --   --   --   --     Chemistries   Recent Labs Lab 11/10/15 1441 11/10/15 2227 11/11/15 0256 11/11/15 0654 11/12/15 0221  NA 148*  --  147*  --  144  K 3.9  --  3.1*  --  4.0  CL 112*  --  107  --  105  CO2 29  --  28  --  30  GLUCOSE 74  --  69  --  217*  BUN 55*  --  43*  --  32*  CREATININE 1.37*  --  1.22*  --  1.37*  CALCIUM 8.5*  --  8.5*  --  8.5*  MG  --  2.2  --  2.3  --   AST  --   --  25  --   --   ALT  --   --  37  --   --   ALKPHOS  --   --  76  --   --   BILITOT  --   --  0.5  --   --    ------------------------------------------------------------------------------------------------------------------ estimated creatinine clearance is 38.8 mL/min (by C-G formula based on Cr of 1.37). ------------------------------------------------------------------------------------------------------------------ No results for input(s): HGBA1C in the last 72 hours. ------------------------------------------------------------------------------------------------------------------ No results for input(s): CHOL, HDL, LDLCALC, TRIG, CHOLHDL, LDLDIRECT in the last 72 hours. ------------------------------------------------------------------------------------------------------------------ No results for input(s): TSH, T4TOTAL, T3FREE, THYROIDAB in the last 72 hours.  Invalid input(s): FREET3 ------------------------------------------------------------------------------------------------------------------ No results for input(s): VITAMINB12, FOLATE, FERRITIN, TIBC, IRON, RETICCTPCT in the last 72 hours.  Coagulation profile No results for input(s): INR, PROTIME in the last 168 hours.  No results for input(s): DDIMER in the last 72 hours.  Cardiac Enzymes  Recent Labs Lab 11/10/15 2227 11/11/15 0256 11/11/15 0940  TROPONINI  0.09* 0.09* 0.09*    ------------------------------------------------------------------------------------------------------------------ Invalid input(s): Jodean Lima MD PhD on 11/12/2015 at 8:21 AM  Between 7am to 7pm - Pager - [phone]  After 7pm go to [email or web address] - password [email or web address]  And look for the night coverage person covering for me after hours  [facility] Office  [phone]

## 2015-11-12 NOTE — Telephone Encounter (Signed)
Spoke with Stacy at CT, states pt is currently hospitalized, so she has cancelled CT schedule for tomorrow for pt.  Forwarding to MR as FYI.

## 2015-11-12 NOTE — Progress Notes (Signed)
All equipment at beside.  Trach consult done. No education needed at this time

## 2015-11-12 NOTE — Consult Note (Signed)
   [facility] CM Inpatient Consult   11/12/2015  RON JUNCO 1946/07/27 [id number]  ACTIVE THN PATIENT: Patient is currently active with [facility] Care Management for chronic disease management services with her [facility] Medicare.  Patient has been engaged by a [facility] [employer].   Our community based plan of care has focused on disease management and community resource support.  Patient will receive a post discharge transition of care call and will be evaluated for monthly home visits for assessments and disease process education.  Will make Inpatient Case Manager aware that [facility] Care Management following for progress, disposition and ongoing care management needs. Of note, [facility] Care Management services does not replace or interfere with any services that are needed or arranged by inpatient case management or social work.  For additional questions or referrals please contact: Charlesetta Shanks, RN BSN CCM [facility]  [phone] business mobile phone Toll free office [phone]

## 2015-11-13 ENCOUNTER — Inpatient Hospital Stay: Admission: RE | Admit: 2015-11-13 | Payer: Medicare Other | Source: Ambulatory Visit

## 2015-11-13 LAB — CBC
HCT: 30.5 % — ABNORMAL LOW (ref 36.0–46.0)
HEMOGLOBIN: 8.9 g/dL — AB (ref 12.0–15.0)
MCH: 29.1 pg (ref 26.0–34.0)
MCHC: 29.2 g/dL — AB (ref 30.0–36.0)
MCV: 99.7 fL (ref 78.0–100.0)
Platelets: 181 10*3/uL (ref 150–400)
RBC: 3.06 MIL/uL — AB (ref 3.87–5.11)
RDW: 16.6 % — ABNORMAL HIGH (ref 11.5–15.5)
WBC: 5.4 10*3/uL (ref 4.0–10.5)

## 2015-11-13 LAB — BASIC METABOLIC PANEL
ANION GAP: 7 (ref 5–15)
BUN: 27 mg/dL — ABNORMAL HIGH (ref 6–20)
CHLORIDE: 106 mmol/L (ref 101–111)
CO2: 32 mmol/L (ref 22–32)
CREATININE: 1.5 mg/dL — AB (ref 0.44–1.00)
Calcium: 8.5 mg/dL — ABNORMAL LOW (ref 8.9–10.3)
GFR calc non Af Amer: 34 mL/min — ABNORMAL LOW (ref >60)
GFR, EST AFRICAN AMERICAN: 40 mL/min — AB (ref >60)
Glucose, Bld: 177 mg/dL — ABNORMAL HIGH (ref 65–99)
Potassium: 3.9 mmol/L (ref 3.5–5.1)
SODIUM: 145 mmol/L (ref 135–145)

## 2015-11-13 LAB — GLUCOSE, CAPILLARY
GLUCOSE-CAPILLARY: 100 mg/dL — AB (ref 65–99)
GLUCOSE-CAPILLARY: 161 mg/dL — AB (ref 65–99)
Glucose-Capillary: 256 mg/dL — ABNORMAL HIGH (ref 65–99)
Glucose-Capillary: 139 mg/dL — ABNORMAL HIGH (ref 65–99)

## 2015-11-13 MED ORDER — SENNOSIDES-DOCUSATE SODIUM 8.6-50 MG PO TABS: 2.0000 | ORAL_TABLET | Freq: Every day | ORAL | Status: DC | PRN

## 2015-11-13 NOTE — Progress Notes (Signed)
[facility] PROGRESS NOTE  [name]:[id number] DOB: 1945-12-30 DOA: 11/10/2015 PCP: Cala Bradford, MD  Brief narrative 70 year old female with history of CHF, diabetes mellitus, respiratory failure status post tracheostomy status presented to the ED with shortness of breath progressive for the past 1 week. She was also acute on chronic respiratory failure secondary to CHF exacerbation. Patient admitted to step down for adequate diuresis.   Assessment/Plan: Acute on chronic hypoxic respiratory failure Secondary to combination of acute on chronic combined systolic and diastolic failure and anemia. -Diuresed  well with IV Lasix 60 mg twice a day. (Patient reports she is 70-80% of normal today). Will continue with current dose of IV Lasix for today and transition to by mouth Lasix tomorrow. -Echo from 07/2015 showed EF of 30 to have 40% with grade 1 diastolic dysfunction. BNP of 1053 on presentation. Monitor strict I/O and daily weight. (-3 L since admission). -Continue aspirin, Plavix, Coreg, Imdur, hydralazine and statin. Not on ACE inhibitor due to cough and not on ARB due to renal dysfunction.  Symptomatic anemia Baseline hemoglobin around 9-10, presently with slight drop. FOBT positive. Colonoscopy done one year ago showed hemorrhoids without any other significant finding. Dr. Roda Shutters spoke with [facility] who recommended to monitor H&H for now. Suspected to be hemorrhoidal. Continue iron supplement. H&H stable.  Chronic elevated troponin.  EKG unremarkable. No chest pain symptoms.  Tracheostomy status  Uncontrolled type 2 diabetes mellitus Last A1c of 9.7. Placed on long-acting insulin sliding scale coverage.  Coronary artery disease Status post CABG and stenting. Has peripheral arterial disease as well. Continue aspirin, Plavix and statin.  CK D stage III Mild worsening of functional baseline possibly due to diuresis. Monitor closely.  DVT prophylaxis: On SCDs given  concern for GI bleed.  Diet: Heart healthy/diabetic   Code Status: DO NOT RESUSCITATE Family Communication: None at bedside Disposition Plan: To medical floor. Home possibly in 48 hours if continues to diurese well.   Consultants:  None  Procedures:  None  Antibiotics:  None  HPI/Subjective: Breathing feels  70-80 % of her normal . Objective: Filed Vitals:   11/13/15 0743 11/13/15 0805  BP: 108/68 117/84  Pulse: 69 82  Temp:  97.9 F (36.6 C)  Resp: 19 19    Intake/Output Summary (Last 24 hours) at 11/13/15 0905 Last data filed at 11/13/15 0500  Gross per 24 hour  Intake      3 ml  Output    900 ml  Net   -897 ml   Filed Weights   11/10/15 2014 11/12/15 0634 11/13/15 0622  Weight: 83.689 kg (184 lb 8 oz) 83.3 kg (183 lb 10.3 oz) 73.392 kg (161 lb 12.8 oz)    Exam:   General:  NAD  HEENT: trached, moist mucosa  Cardiovascular: NS1&S2, no murmurs ,rubs or gallop  Respiratory: bibasilar crackles, no rhonchi  Abdomen: soft, mild distention, NT, BS+  Musculoskeletal: trace edema b/l  CNS: alert and oriented.   Data Reviewed: Basic Metabolic Panel:  Recent Labs Lab 11/10/15 1441 11/10/15 2227 11/11/15 0256 11/11/15 0654 11/12/15 0221 11/13/15 0257  NA 148*  --  147*  --  144 145  K 3.9  --  3.1*  --  4.0 3.9  CL 112*  --  107  --  105 106  CO2 29  --  28  --  30 32  GLUCOSE 74  --  69  --  217* 177*  BUN 55*  --  43*  --  32* 27*  CREATININE 1.37*  --  1.22*  --  1.37* 1.50*  CALCIUM 8.5*  --  8.5*  --  8.5* 8.5*  MG  --  2.2  --  2.3  --   --   PHOS  --  3.6  --   --   --   --    Liver Function Tests:  Recent Labs Lab 11/11/15 0256  AST 25  ALT 37  ALKPHOS 76  BILITOT 0.5  PROT 5.8*  ALBUMIN 3.2*   No results for input(s): LIPASE, AMYLASE in the last 168 hours. No results for input(s): AMMONIA in the last 168 hours. CBC:  Recent Labs Lab 11/10/15 1441  11/11/15 0256 11/11/15 0459 11/11/15 1206 11/12/15 0221  11/13/15 0257  WBC 5.8  --  5.7  --   --  4.9 5.4  NEUTROABS 4.4  --   --   --   --   --   --   HGB 8.8*  < > 9.2* 9.3* 9.0* 9.3* 8.9*  HCT 29.2*  < > 29.8* 30.2* 29.8* 30.6* 30.5*  MCV 98.6  --  97.4  --   --  97.8 99.7  PLT 188  --  185  --   --  198 181  < > = values in this interval not displayed. Cardiac Enzymes:  Recent Labs Lab 11/10/15 1441 11/10/15 2227 11/11/15 0256 11/11/15 0940  TROPONINI 0.07* 0.09* 0.09* 0.09*   BNP (last 3 results)  Recent Labs  01/06/15 0520 07/27/15 0744 11/10/15 1441  BNP 1061.8* 852.0* 1053.3*    ProBNP (last 3 results) No results for input(s): PROBNP in the last 8760 hours.  CBG:  Recent Labs Lab 11/12/15 0758 11/12/15 1236 11/12/15 1615 11/12/15 2209 11/13/15 0807  GLUCAP 180* 178* 123* 199* 100*    Recent Results (from the past 240 hour(s))  MRSA PCR Screening     Status: None   Collection Time: 11/10/15  8:11 PM  Result Value Ref Range Status   MRSA by PCR NEGATIVE NEGATIVE Final    Comment:        The GeneXpert MRSA Assay (FDA approved for NASAL specimens only), is one component of a comprehensive MRSA colonization surveillance program. It is not intended to diagnose MRSA infection nor to guide or monitor treatment for MRSA infections.      Studies: No results found.  Scheduled Meds: . antiseptic oral rinse  7 mL Mouth Rinse BID  . aspirin EC  81 mg Oral Daily  . carvedilol  25 mg Oral BID WC  . clopidogrel  75 mg Oral Daily  . dicyclomine  20 mg Oral TID AC  . DULoxetine  60 mg Oral Daily  . ferrous sulfate  325 mg Oral Daily  . furosemide  60 mg Intravenous Q12H  . guaiFENesin  600 mg Oral BID  . hydrALAZINE  25 mg Oral 3 times per day  . insulin aspart  0-9 Units Subcutaneous TID WC  . insulin glargine  30 Units Subcutaneous QHS  . isosorbide mononitrate  15 mg Oral Daily  . levothyroxine  50 mcg Oral QAC breakfast  . multivitamin with minerals  1 tablet Oral Daily  . pantoprazole  40 mg Oral  BID  . saccharomyces boulardii  250 mg Oral BID  . simvastatin  40 mg Oral QPM  . sodium chloride  3 mL Intravenous Q12H   Continuous Infusions:     Time spent: 25 minutes    Bishoy Cupp  [facility] Pager [phone]. If 7PM-7AM, please contact night-coverage at [email or web address], password [facility] 11/13/2015, 9:05 AM  LOS: 3 days

## 2015-11-13 NOTE — Progress Notes (Signed)
Report given to 2W10. RN to get patient offered to pick her up in wheelchair at 1340. Patient to transfer on trach collar with wheelchair and belongings. Belongings include cane, clothing, and cell phone/charger.  Noe Gens, RN

## 2015-11-13 NOTE — Care Management Note (Signed)
Case Management Note  Patient Details  Name: [name] MRN: [id number] Date of Birth: 15-Apr-1946  Subjective/Objective:    Patient lives with daughter, uses [employer] for transportation services. PCP is Dr Laurann Montana; has a nurse that checks on her monthly, home 02 through [facility]. Has a walker and cane.                         Expected Discharge Plan:  Home/Self Care  Discharge planning Services  CM Consult  Status of Service:  In process, will continue to follow  Magdalene River, RN 11/13/2015, 10:49 AM

## 2015-11-13 NOTE — Progress Notes (Signed)
Came to assess pt, pt is stable at this time resting/sleeping comfortably and peacefully. No distress or complications noted.

## 2015-11-14 LAB — GLUCOSE, CAPILLARY
GLUCOSE-CAPILLARY: 84 mg/dL (ref 65–99)
GLUCOSE-CAPILLARY: 159 mg/dL — AB (ref 65–99)
Glucose-Capillary: 45 mg/dL — ABNORMAL LOW (ref 65–99)
Glucose-Capillary: 163 mg/dL — ABNORMAL HIGH (ref 65–99)

## 2015-11-14 LAB — BASIC METABOLIC PANEL
ANION GAP: 9 (ref 5–15)
BUN: 22 mg/dL — ABNORMAL HIGH (ref 6–20)
CO2: 32 mmol/L (ref 22–32)
Calcium: 8.7 mg/dL — ABNORMAL LOW (ref 8.9–10.3)
Chloride: 104 mmol/L (ref 101–111)
Creatinine, Ser: 1.48 mg/dL — ABNORMAL HIGH (ref 0.44–1.00)
GFR calc Af Amer: 41 mL/min — ABNORMAL LOW (ref >60)
GFR, EST NON AFRICAN AMERICAN: 35 mL/min — AB (ref >60)
GLUCOSE: 175 mg/dL — AB (ref 65–99)
POTASSIUM: 3.5 mmol/L (ref 3.5–5.1)
Sodium: 145 mmol/L (ref 135–145)

## 2015-11-14 LAB — CBC
HEMATOCRIT: 31.3 % — AB (ref 36.0–46.0)
HEMOGLOBIN: 9.5 g/dL — AB (ref 12.0–15.0)
MCH: 29.7 pg (ref 26.0–34.0)
MCHC: 30.4 g/dL (ref 30.0–36.0)
MCV: 97.8 fL (ref 78.0–100.0)
Platelets: 189 10*3/uL (ref 150–400)
RBC: 3.2 MIL/uL — ABNORMAL LOW (ref 3.87–5.11)
RDW: 16.5 % — ABNORMAL HIGH (ref 11.5–15.5)
WBC: 4.1 10*3/uL (ref 4.0–10.5)

## 2015-11-14 MED ORDER — INSULIN GLARGINE 100 UNIT/ML ~~LOC~~ SOLN
24.0000 [IU] | Freq: Every day | SUBCUTANEOUS | Status: DC
  Administered 2015-11-14: 24 [IU] via SUBCUTANEOUS
  Filled 2015-11-14 (×2): qty 0.24

## 2015-11-14 MED ORDER — FUROSEMIDE 80 MG PO TABS: 80.0000 mg | ORAL_TABLET | ORAL | Status: DC

## 2015-11-14 MED ORDER — FUROSEMIDE 80 MG PO TABS
80.0000 mg | ORAL_TABLET | Freq: Every day | ORAL | Status: DC
  Administered 2015-11-14: 80 mg via ORAL
  Filled 2015-11-14: qty 1

## 2015-11-14 MED ORDER — GABAPENTIN 300 MG PO CAPS
300.0000 mg | ORAL_CAPSULE | Freq: Every day | ORAL | Status: DC
  Administered 2015-11-14 – 2015-11-18 (×5): 300 mg via ORAL
  Filled 2015-11-14 (×6): qty 1

## 2015-11-14 MED ORDER — NITROGLYCERIN 0.4 MG SL SUBL: 0.4000 mg | SUBLINGUAL_TABLET | SUBLINGUAL | Status: DC | PRN

## 2015-11-14 MED ORDER — FUROSEMIDE 40 MG PO TABS
40.0000 mg | ORAL_TABLET | Freq: Every day | ORAL | Status: DC
  Administered 2015-11-14: 40 mg via ORAL
  Filled 2015-11-14: qty 1

## 2015-11-14 NOTE — Progress Notes (Addendum)
[facility] Progress Note  Patient: [name] JJO:[id number]   PCP: Cala Bradford, MD DOB: 1946/04/14   DOA: 11/10/2015   DOS: 11/14/2015   Date of Service: the patient was seen and examined on 11/14/2015  Subjective: Patient continues to feel better denies any chest pain or abdominal pain. No diarrhea Does not use any oxygen during the day at home but uses trach collar at night. Nutrition: Able to tolerate oral diet Activity: Ambulating in the room Last BM: 11/13/2015  Assessment and Plan: 1. 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 Patient was on IV Lasix until morning. Resume home oral Lasix dose. Continue aspirin and Plavix Coreg and Imdur hydralazine and statin. Check BMP to monitor renal function.  2. Anemia of chronic disease.  Chronic kidney disease stage III. Monitor renal function. H&H remained stable. Most likely hemorrhoidal bleeding. Continue iron supplementation discontinue PPI.  3. Tracheostomy status. Continue trach collar as needed.  4. Coronary artery disease, chronic troponin. Continue aspirin and Plavix and statin and beta blocker.  5. Type 2 diabetes mellitus. Last hemoglobin A1c 9.31 July 2015. Tina sliding scale insulin. Reduce long-acting Lantus to 24 units since the patient is on 48 units of Degludec at home and was hypoglycemic this morning  DVT Prophylaxis: Mechanical compression device Nutrition: Cardiac diet Advance goals of care discussion: DNR/DNI  Brief Summary of Hospitalization:  HPI: As per the H and P dictated on admission, "With a history of heart failure, diabetes, coronary artery disease, tracheostomy, that presented to the emergency department with complaints of shortness of breath. Over the last week, patient has been more short of breath and having abdominal distention as well as lower extremity swelling. She states she has gained a few pounds in the last  week as well. She has been taking her Lasix. Patient is also complaining of dry cough however no fevers. She denies a sick contacts or recent travel. Patient states it makes her shortness of breath better or worse. She has not noticed any bloody stools. Patient does state she's had diarrhea for more than a year. In the emergency department, patient was noted to have an elevated BNP as well as a hemoglobin of 8.8. She was found to have a positive fecal occult blood test. Patient was found to be in respiratory distress with oxygenation levels in the 80s. She was placed on supplemental oxygen. TRH called for admission." Daily update, Procedures: Lasix transitioned to oral 11/14/2015 Consultants: none Antibiotics: Anti-infectives    None       Family Communication: no family was present at bedside, at the time of interview.   Disposition:  Expected discharge date: 11/15/2015 Barriers to safe discharge: Hypoxia   Intake/Output Summary (Last 24 hours) at 11/14/15 1104 Last data filed at 11/13/15 1700  Gross per 24 hour  Intake    340 ml  Output      0 ml  Net    340 ml   Filed Weights   11/12/15 0634 11/13/15 0622 11/14/15 0457  Weight: 83.3 kg (183 lb 10.3 oz) 73.392 kg (161 lb 12.8 oz) 80.7 kg (177 lb 14.6 oz)    Objective: Physical Exam: Filed Vitals:   11/13/15 2116 11/13/15 2316 11/14/15 0457 11/14/15 0756  BP:   145/48   Pulse:   66   Temp:   97.8 F (36.6 C)   TempSrc:   Oral   Resp:   17   Height:  Weight:   80.7 kg (177 lb 14.6 oz)   SpO2: 97% 98% 96% 94%     General: Appear in mild distress, no Rash; Oral Mucosa moist. Cardiovascular: S1 and S2 Present, no Murmur, no JVD Respiratory: Bilateral Air entry present and basal Crackles, no wheezes Abdomen: Bowel Sound present, Soft and no tenderness Extremities: no Pedal edema, no calf tenderness Neurology: Grossly no focal neuro deficit.  Data Reviewed: CBC:  Recent Labs Lab 11/10/15 1441  11/11/15 0256  11/11/15 0459 11/11/15 1206 11/12/15 0221 11/13/15 0257  WBC 5.8  --  5.7  --   --  4.9 5.4  NEUTROABS 4.4  --   --   --   --   --   --   HGB 8.8*  < > 9.2* 9.3* 9.0* 9.3* 8.9*  HCT 29.2*  < > 29.8* 30.2* 29.8* 30.6* 30.5*  MCV 98.6  --  97.4  --   --  97.8 99.7  PLT 188  --  185  --   --  198 181  < > = values in this interval not displayed. Basic Metabolic Panel:  Recent Labs Lab 11/10/15 1441 11/10/15 2227 11/11/15 0256 11/11/15 0654 11/12/15 0221 11/13/15 0257  NA 148*  --  147*  --  144 145  K 3.9  --  3.1*  --  4.0 3.9  CL 112*  --  107  --  105 106  CO2 29  --  28  --  30 32  GLUCOSE 74  --  69  --  217* 177*  BUN 55*  --  43*  --  32* 27*  CREATININE 1.37*  --  1.22*  --  1.37* 1.50*  CALCIUM 8.5*  --  8.5*  --  8.5* 8.5*  MG  --  2.2  --  2.3  --   --   PHOS  --  3.6  --   --   --   --    Liver Function Tests:  Recent Labs Lab 11/11/15 0256  AST 25  ALT 37  ALKPHOS 76  BILITOT 0.5  PROT 5.8*  ALBUMIN 3.2*   No results for input(s): LIPASE, AMYLASE in the last 168 hours. No results for input(s): AMMONIA in the last 168 hours.  Cardiac Enzymes:  Recent Labs Lab 11/10/15 1441 11/10/15 2227 11/11/15 0256 11/11/15 0940  TROPONINI 0.07* 0.09* 0.09* 0.09*    BNP (last 3 results)  Recent Labs  01/06/15 0520 07/27/15 0744 11/10/15 1441  BNP 1061.8* 852.0* 1053.3*    CBG:  Recent Labs Lab 11/13/15 1303 11/13/15 1655 11/13/15 2107 11/14/15 0628 11/14/15 0654  GLUCAP 161* 256* 139* 45* 84    Recent Results (from the past 240 hour(s))  MRSA PCR Screening     Status: None   Collection Time: 11/10/15  8:11 PM  Result Value Ref Range Status   MRSA by PCR NEGATIVE NEGATIVE Final    Comment:        The GeneXpert MRSA Assay (FDA approved for NASAL specimens only), is one component of a comprehensive MRSA colonization surveillance program. It is not intended to diagnose MRSA infection nor to guide or monitor treatment for MRSA  infections.      Studies: No results found.   Scheduled Meds: . antiseptic oral rinse  7 mL Mouth Rinse BID  . aspirin EC  81 mg Oral Daily  . carvedilol  25 mg Oral BID WC  . clopidogrel  75 mg Oral Daily  .  dicyclomine  20 mg Oral TID AC  . DULoxetine  60 mg Oral Daily  . ferrous sulfate  325 mg Oral Daily  . furosemide  40 mg Oral q1800  . furosemide  80 mg Oral Daily  . gabapentin  300 mg Oral QHS  . guaiFENesin  600 mg Oral BID  . hydrALAZINE  25 mg Oral 3 times per day  . insulin aspart  0-9 Units Subcutaneous TID WC  . insulin glargine  24 Units Subcutaneous QHS  . isosorbide mononitrate  15 mg Oral Daily  . levothyroxine  50 mcg Oral QAC breakfast  . multivitamin with minerals  1 tablet Oral Daily  . saccharomyces boulardii  250 mg Oral BID  . simvastatin  40 mg Oral QPM   Continuous Infusions:  PRN Meds: acetaminophen **OR** acetaminophen, ipratropium-albuterol, morphine injection, nitroGLYCERIN, ondansetron **OR** ondansetron (ZOFRAN) IV, phenol-menthol, senna-docusate  Time spent: 30 minutes  Author: Lynden Oxford, MD [facility] Pager: [phone] 11/14/2015 11:04 AM  If 7PM-7AM, please contact night-coverage at [email or web address], password [facility]

## 2015-11-14 NOTE — Progress Notes (Signed)
Pt sitting without O2 on saturations were 94. Pt with o2 of 6L and FiO2 of 35 saturations were 99%. While walking pt complained of pain in lower back and knees but o2 sats stayed at 98-99% with 6L and FiO2 of 35. Will continue to monitor.

## 2015-11-15 ENCOUNTER — Inpatient Hospital Stay (HOSPITAL_COMMUNITY): Payer: Medicare Other

## 2015-11-15 DIAGNOSIS — I440 Atrioventricular block, first degree: Secondary | ICD-10-CM | POA: Diagnosis present

## 2015-11-15 DIAGNOSIS — Z930 Tracheostomy status: Secondary | ICD-10-CM

## 2015-11-15 DIAGNOSIS — R197 Diarrhea, unspecified: Secondary | ICD-10-CM | POA: Diagnosis not present

## 2015-11-15 DIAGNOSIS — J449 Chronic obstructive pulmonary disease, unspecified: Secondary | ICD-10-CM

## 2015-11-15 LAB — GLUCOSE, CAPILLARY
GLUCOSE-CAPILLARY: 214 mg/dL — AB (ref 65–99)
GLUCOSE-CAPILLARY: 58 mg/dL — AB (ref 65–99)
GLUCOSE-CAPILLARY: 122 mg/dL — AB (ref 65–99)
GLUCOSE-CAPILLARY: 173 mg/dL — AB (ref 65–99)
GLUCOSE-CAPILLARY: 274 mg/dL — AB (ref 65–99)
Glucose-Capillary: 69 mg/dL (ref 65–99)

## 2015-11-15 MED ORDER — FUROSEMIDE 80 MG PO TABS
80.0000 mg | ORAL_TABLET | Freq: Two times a day (BID) | ORAL | Status: DC
  Administered 2015-11-15 (×2): 80 mg via ORAL
  Filled 2015-11-15 (×2): qty 1

## 2015-11-15 MED ORDER — CARVEDILOL 12.5 MG PO TABS
12.5000 mg | ORAL_TABLET | Freq: Two times a day (BID) | ORAL | Status: DC
  Administered 2015-11-15 – 2015-11-19 (×8): 12.5 mg via ORAL
  Filled 2015-11-15 (×8): qty 1

## 2015-11-15 MED ORDER — IPRATROPIUM-ALBUTEROL 0.5-2.5 (3) MG/3ML IN SOLN
3.0000 mL | Freq: Three times a day (TID) | RESPIRATORY_TRACT | Status: DC
  Administered 2015-11-16 – 2015-11-19 (×10): 3 mL via RESPIRATORY_TRACT
  Filled 2015-11-15 (×10): qty 3

## 2015-11-15 MED ORDER — INSULIN GLARGINE 100 UNIT/ML ~~LOC~~ SOLN
18.0000 [IU] | Freq: Every day | SUBCUTANEOUS | Status: DC
  Administered 2015-11-15 – 2015-11-18 (×4): 18 [IU] via SUBCUTANEOUS
  Filled 2015-11-15 (×5): qty 0.18

## 2015-11-15 MED ORDER — IPRATROPIUM-ALBUTEROL 0.5-2.5 (3) MG/3ML IN SOLN
3.0000 mL | RESPIRATORY_TRACT | Status: DC
Start: 2015-11-15 — End: 2015-11-15
  Administered 2015-11-15: 3 mL via RESPIRATORY_TRACT
  Filled 2015-11-15 (×2): qty 3

## 2015-11-15 NOTE — Progress Notes (Signed)
Pt has had 3 loose stools. Pt has not received any stool softners, laxatives, or antibiotics. MD notified. Will continue to monitor.

## 2015-11-15 NOTE — Progress Notes (Signed)
[facility] Progress Note  Patient: [name] UEA:[id number]   PCP: Cala Bradford, MD DOB: November 19, 1945   DOA: 11/10/2015   DOS: 11/15/2015   Date of Service: the patient was seen and examined on 11/15/2015  Subjective: Patient was complaining of fatigue this morning. Shortness of breath is getting better. He did have 3 episodes of diarrhea today. Abdominal pain and nausea and vomiting active bleeding. Nutrition: Able to tolerate oral diet Activity: Ambulating in the room Last BM: 11/15/2015  Assessment and Plan: 1. Acute respiratory failure with hypoxia (HCC) Acute on chronic combined CHF Inaccurate documentation of her ins and outs Switching 80 mg twice a day Lasix. Patient was on 60 mg IV twice a day Lasix earlier. Continue aspirin and Plavix Coreg and Imdur hydralazine and statin. Check BMP to monitor renal function.  2. Anemia of chronic disease.  Chronic kidney disease stage III. Monitor renal function. H&H remained stable. Most likely hemorrhoidal bleeding. Continue iron supplementation discontinue PPI.  3. Tracheostomy status. Continue trach collar as needed.  4. Coronary artery disease, chronic troponin. Continue aspirin and Plavix and statin and beta blocker.  5. Type 2 diabetes mellitus. Last hemoglobin A1c 9.31 July 2015. Continue sliding scale insulin. Reduce long-acting Lantus to 18.  6. Diarrhea. If the patient has further episodes of diarrhea related to rule out C. difficile. No evidence of abdominal tenderness or fever or infection.  7. COPD. Adding nebulizers to the regimen. Continue with flutter device.  8. Bradycardia arrhythmia. The patient had first-degree AV block on telemetry, she occasionally has skipped beats as well. Patient was feeling fatigued this morning. We will reduce the Coreg from 25 mg twice a day to 12.5 mg twice a day and monitor on telemetry.  DVT Prophylaxis: Mechanical compression device Nutrition: Cardiac  diet Advance goals of care discussion: DNR/DNI  Brief Summary of Hospitalization:  HPI: As per the H and P dictated on admission, "With a history of heart failure, diabetes, coronary artery disease, tracheostomy, that presented to the emergency department with complaints of shortness of breath. Over the last week, patient has been more short of breath and having abdominal distention as well as lower extremity swelling. She states she has gained a few pounds in the last week as well. She has been taking her Lasix. Patient is also complaining of dry cough however no fevers. She denies a sick contacts or recent travel. Patient states it makes her shortness of breath better or worse. She has not noticed any bloody stools. Patient does state she's had diarrhea for more than a year. In the emergency department, patient was noted to have an elevated BNP as well as a hemoglobin of 8.8. She was found to have a positive fecal occult blood test. Patient was found to be in respiratory distress with oxygenation levels in the 80s. She was placed on supplemental oxygen. TRH called for admission." Daily update, Procedures: Lasix transitioned to oral 11/14/2015 Consultants: none Antibiotics: Anti-infectives    None       Family Communication: no family was present at bedside, at the time of interview.   Disposition:  Expected discharge date: 11/17/2015 Barriers to safe discharge: Hypoxia   Intake/Output Summary (Last 24 hours) at 11/15/15 1505 Last data filed at 11/15/15 1434  Gross per 24 hour  Intake    840 ml  Output      0 ml  Net    840 ml   Filed Weights   11/13/15 0622 11/14/15 0457 11/15/15 0515  Weight: 73.392 kg (161 lb 12.8 oz) 80.7 kg (177 lb 14.6 oz) 82.2 kg (181 lb 3.5 oz)    Objective: Physical Exam: Filed Vitals:   11/15/15 0834 11/15/15 0935 11/15/15 1159 11/15/15 1431  BP:    127/50  Pulse:    72  Temp:    98.8 F (37.1 C)  TempSrc:    Oral  Resp:    17  Height:       Weight:      SpO2: 100% 94% 94% 94%     General: Appear in mild distress, no Rash; Oral Mucosa moist. Cardiovascular: S1 and S2 Present, no Murmur, no JVD Respiratory: Bilateral Air entry present and basal Crackles, expiratory wheezes Abdomen: Bowel Sound present, Soft and no tenderness Extremities: no Pedal edema, no calf tenderness Neurology: Grossly no focal neuro deficit.  Data Reviewed: CBC:  Recent Labs Lab 11/10/15 1441  11/11/15 0256 11/11/15 0459 11/11/15 1206 11/12/15 0221 11/13/15 0257 11/14/15 1544  WBC 5.8  --  5.7  --   --  4.9 5.4 4.1  NEUTROABS 4.4  --   --   --   --   --   --   --   HGB 8.8*  < > 9.2* 9.3* 9.0* 9.3* 8.9* 9.5*  HCT 29.2*  < > 29.8* 30.2* 29.8* 30.6* 30.5* 31.3*  MCV 98.6  --  97.4  --   --  97.8 99.7 97.8  PLT 188  --  185  --   --  198 181 189  < > = values in this interval not displayed. Basic Metabolic Panel:  Recent Labs Lab 11/10/15 1441 11/10/15 2227 11/11/15 0256 11/11/15 0654 11/12/15 0221 11/13/15 0257 11/14/15 1544  NA 148*  --  147*  --  144 145 145  K 3.9  --  3.1*  --  4.0 3.9 3.5  CL 112*  --  107  --  105 106 104  CO2 29  --  28  --  30 32 32  GLUCOSE 74  --  69  --  217* 177* 175*  BUN 55*  --  43*  --  32* 27* 22*  CREATININE 1.37*  --  1.22*  --  1.37* 1.50* 1.48*  CALCIUM 8.5*  --  8.5*  --  8.5* 8.5* 8.7*  MG  --  2.2  --  2.3  --   --   --   PHOS  --  3.6  --   --   --   --   --    Liver Function Tests:  Recent Labs Lab 11/11/15 0256  AST 25  ALT 37  ALKPHOS 76  BILITOT 0.5  PROT 5.8*  ALBUMIN 3.2*   No results for input(s): LIPASE, AMYLASE in the last 168 hours. No results for input(s): AMMONIA in the last 168 hours.  Cardiac Enzymes:  Recent Labs Lab 11/10/15 1441 11/10/15 2227 11/11/15 0256 11/11/15 0940  TROPONINI 0.07* 0.09* 0.09* 0.09*    BNP (last 3 results)  Recent Labs  01/06/15 0520 07/27/15 0744 11/10/15 1441  BNP 1061.8* 852.0* 1053.3*    CBG:  Recent Labs Lab  11/14/15 1622 11/14/15 2137 11/15/15 0635 11/15/15 0654 11/15/15 1123  GLUCAP 163* 214* 58* 69 122*    Recent Results (from the past 240 hour(s))  MRSA PCR Screening     Status: None   Collection Time: 11/10/15  8:11 PM  Result Value Ref Range Status   MRSA by PCR NEGATIVE NEGATIVE Final  Comment:        The GeneXpert MRSA Assay (FDA approved for NASAL specimens only), is one component of a comprehensive MRSA colonization surveillance program. It is not intended to diagnose MRSA infection nor to guide or monitor treatment for MRSA infections.      Studies: Dg Chest 2 View  11/15/2015  CLINICAL DATA:  Shortness of breath, congestive heart failure EXAM: CHEST  2 VIEW COMPARISON:  11/11/2015 FINDINGS: Tracheostomy tube is unchanged in position. Central vascular congestion again noted. Status post median sternotomy. There is mild interstitial prominence bilateral consistent with mild interstitial edema with slight improvement from prior exam. Persistent streaky atelectasis or scarring right base and lingula. IMPRESSION: There is mild interstitial prominence bilateral consistent with mild interstitial edema with slight improvement from prior exam. Persistent streaky atelectasis or scarring right base and lingula. Electronically Signed   By: Natasha Mead M.D.   On: 11/15/2015 15:00     Scheduled Meds: . antiseptic oral rinse  7 mL Mouth Rinse BID  . aspirin EC  81 mg Oral Daily  . carvedilol  25 mg Oral BID WC  . clopidogrel  75 mg Oral Daily  . dicyclomine  20 mg Oral TID AC  . DULoxetine  60 mg Oral Daily  . ferrous sulfate  325 mg Oral Daily  . furosemide  80 mg Oral BID  . gabapentin  300 mg Oral QHS  . guaiFENesin  600 mg Oral BID  . hydrALAZINE  25 mg Oral 3 times per day  . insulin aspart  0-9 Units Subcutaneous TID WC  . insulin glargine  18 Units Subcutaneous QHS  . isosorbide mononitrate  15 mg Oral Daily  . levothyroxine  50 mcg Oral QAC breakfast  . multivitamin  with minerals  1 tablet Oral Daily  . saccharomyces boulardii  250 mg Oral BID  . simvastatin  40 mg Oral QPM   Continuous Infusions:  PRN Meds: acetaminophen **OR** acetaminophen, ipratropium-albuterol, nitroGLYCERIN, ondansetron **OR** ondansetron (ZOFRAN) IV, phenol-menthol, senna-docusate  Time spent: 30 minutes  Author: Lynden Oxford, MD [facility] Pager: [phone] 11/15/2015 3:05 PM  If 7PM-7AM, please contact night-coverage at [email or web address], password [facility]

## 2015-11-15 NOTE — Progress Notes (Signed)
Came to assess pt, pt is stable at this time no complications or distress noted.

## 2015-11-16 DIAGNOSIS — R197 Diarrhea, unspecified: Secondary | ICD-10-CM

## 2015-11-16 LAB — GLUCOSE, CAPILLARY
GLUCOSE-CAPILLARY: 144 mg/dL — AB (ref 65–99)
GLUCOSE-CAPILLARY: 102 mg/dL — AB (ref 65–99)
Glucose-Capillary: 146 mg/dL — ABNORMAL HIGH (ref 65–99)
Glucose-Capillary: 172 mg/dL — ABNORMAL HIGH (ref 65–99)

## 2015-11-16 LAB — COMPREHENSIVE METABOLIC PANEL
ALBUMIN: 3.2 g/dL — AB (ref 3.5–5.0)
ALT: 24 U/L (ref 14–54)
AST: 20 U/L (ref 15–41)
Alkaline Phosphatase: 77 U/L (ref 38–126)
Anion gap: 7 (ref 5–15)
BILIRUBIN TOTAL: 0.5 mg/dL (ref 0.3–1.2)
BUN: 28 mg/dL — AB (ref 6–20)
CO2: 32 mmol/L (ref 22–32)
CREATININE: 1.44 mg/dL — AB (ref 0.44–1.00)
Calcium: 8.4 mg/dL — ABNORMAL LOW (ref 8.9–10.3)
Chloride: 103 mmol/L (ref 101–111)
GFR calc Af Amer: 42 mL/min — ABNORMAL LOW (ref >60)
GFR calc non Af Amer: 36 mL/min — ABNORMAL LOW (ref >60)
GLUCOSE: 214 mg/dL — AB (ref 65–99)
POTASSIUM: 3.9 mmol/L (ref 3.5–5.1)
Sodium: 142 mmol/L (ref 135–145)
TOTAL PROTEIN: 6.1 g/dL — AB (ref 6.5–8.1)

## 2015-11-16 LAB — CBC WITH DIFFERENTIAL/PLATELET
BASOS ABS: 0 10*3/uL (ref 0.0–0.1)
BASOS PCT: 0 %
Eosinophils Absolute: 0.6 10*3/uL (ref 0.0–0.7)
Eosinophils Relative: 12 %
HEMATOCRIT: 31.1 % — AB (ref 36.0–46.0)
HEMOGLOBIN: 9.6 g/dL — AB (ref 12.0–15.0)
LYMPHS PCT: 16 %
Lymphs Abs: 0.7 10*3/uL (ref 0.7–4.0)
MCH: 30 pg (ref 26.0–34.0)
MCHC: 30.9 g/dL (ref 30.0–36.0)
MCV: 97.2 fL (ref 78.0–100.0)
MONO ABS: 0.8 10*3/uL (ref 0.1–1.0)
Monocytes Relative: 17 %
NEUTROS ABS: 2.6 10*3/uL (ref 1.7–7.7)
NEUTROS PCT: 55 %
Platelets: 169 10*3/uL (ref 150–400)
RBC: 3.2 MIL/uL — AB (ref 3.87–5.11)
RDW: 16.2 % — AB (ref 11.5–15.5)
WBC: 4.7 10*3/uL (ref 4.0–10.5)

## 2015-11-16 LAB — PROTIME-INR
INR: 1.22 (ref 0.00–1.49)
Prothrombin Time: 15.5 seconds — ABNORMAL HIGH (ref 11.6–15.2)

## 2015-11-16 LAB — MAGNESIUM: Magnesium: 2.2 mg/dL (ref 1.7–2.4)

## 2015-11-16 MED ORDER — TORSEMIDE 20 MG PO TABS
40.0000 mg | ORAL_TABLET | Freq: Two times a day (BID) | ORAL | Status: DC
  Administered 2015-11-16 – 2015-11-19 (×6): 40 mg via ORAL
  Filled 2015-11-16 (×6): qty 2

## 2015-11-16 MED ORDER — FUROSEMIDE 10 MG/ML IJ SOLN
60.0000 mg | Freq: Once | INTRAMUSCULAR | Status: AC
  Administered 2015-11-16: 60 mg via INTRAVENOUS
  Filled 2015-11-16: qty 6

## 2015-11-16 NOTE — Care Management Note (Addendum)
Case Management Note  Patient Details  Name: [name] MRN: [id number] Date of Birth: 12/29/1945  Subjective/Objective:    Pt admitted with acute/chronic CHF                  Action/Plan: Action/Plan: Patient lives at home with daughter, use the [facility] for transportation services. PCP is Dr Laurann Montana; Dr Jenne Pane ENT. Has private insurance with Medicare / [name] - with prescription drug coverage - no problems getting her medication. Pharmacy of choice is [facility]. Patient has a nurse that checks on her monthly, home 02 through [facility]. DME - has a walker and cane. No needs identifie   Expected Discharge Date:                  Expected Discharge Plan:  Home/Self Care  In-House Referral:     Discharge planning Services  CM Consult  Post Acute Care Choice:  Resumption of Svcs/PTA Provider Choice offered to:  Patient  DME Arranged:  Oxygen DME Agency:  [facility]  HH Arranged:    HH Agency:     Status of Service:  In process, will continue to follow  Medicare Important Message Given:  Yes Date Medicare IM Given:    Medicare IM give by:    Date Additional Medicare IM Given:    Additional Medicare Important Message give by:     If discussed at Long Length of Stay Meetings, dates discussed:    Additional Comments:  Cherylann Parr, RN 11/16/2015, 3:53 PM

## 2015-11-16 NOTE — Progress Notes (Signed)
UR Completed. Rainn Zupko, RN, BSN.  [phone] 

## 2015-11-16 NOTE — Care Management Note (Deleted)
Case Management Note  Patient Details  Name: [name] MRN: [id number] Date of Birth: 03/15/1946  Subjective/Objective:                    Action/Plan: Action/Plan: Patient lives at home with daughter, use the [employer] for transportation services. PCP is Dr Laurann Montana; Dr Jenne Pane ENT. Has private insurance with Medicare / [name] - with prescription drug coverage - no problems getting her medication. [facility] of choice is [facility]. Patient has a nurse that checks on her monthly, home 02 through [facility]. DME - has a walker and cane. No needs identifie   Expected Discharge Date:                  Expected Discharge Plan:  Home/Self Care  In-House Referral:     Discharge planning Services  CM Consult  Post Acute Care Choice:    Choice offered to:     DME Arranged:    DME Agency:     HH Arranged:    HH Agency:     Status of Service:  In process, will continue to follow  Medicare Important Message Given:  Yes Date Medicare IM Given:    Medicare IM give by:    Date Additional Medicare IM Given:    Additional Medicare Important Message give by:     If discussed at Long Length of Stay Meetings, dates discussed:    Additional Comments:  Cherylann Parr, RN 11/16/2015, 3:51 PM

## 2015-11-16 NOTE — Progress Notes (Signed)
[facility] Progress Note  Patient: [name] WJX:[id number]   PCP: Cala Bradford, MD DOB: 04/17/1946   DOA: 11/10/2015   DOS: 11/16/2015   Date of Service: the patient was seen and examined on 11/16/2015  Subjective: Patient is feeling better today but has shortness of breath. Weight has also increased. No nausea no vomiting no chest pain. Diarrhea has resolved Nutrition: Able to tolerate oral diet Activity: Ambulating in the room Last BM: 11/16/2015  Assessment and Plan: 1. Acute on chronic combined systolic and diastolic CHF (congestive heart failure) (HCC) Inaccurate documentation of her ins and outs. Switching to oral torsemide starting tonight. Giving her one dose of IV Lasix 60 mg once. Continue aspirin and Plavix Coreg and Imdur hydralazine and statin. Check BMP to monitor renal function.  2. Anemia of chronic disease.  Chronic kidney disease stage III. Monitor renal function. H&H remained stable. Most likely hemorrhoidal bleeding. Continue iron supplementation discontinue PPI.  3. Tracheostomy status. Continue trach collar as needed.  4. Coronary artery disease, chronic troponin. Continue aspirin and Plavix and statin and beta blocker.  5. Type 2 diabetes mellitus. Last hemoglobin A1c 9.31 July 2015. Continue sliding scale insulin. Continue long-acting Lantus to 18.  6. Diarrhea. Resolved at present No evidence of abdominal tenderness or fever or infection.  7. COPD. Continue nebulizers to the regimen. Continue with flutter device.  8. Bradycardia arrhythmia. Resolved at present The patient had first-degree AV block on telemetry, she occasionally has skipped beats as well. Continue Coreg 12.5 mg twice a day.  DVT Prophylaxis: Mechanical compression device Nutrition: Cardiac diet Advance goals of care discussion: DNR/DNI  Brief Summary of Hospitalization:  HPI: As per the H and P dictated on admission, "With a history of heart failure,  diabetes, coronary artery disease, tracheostomy, that presented to the emergency department with complaints of shortness of breath. Over the last week, patient has been more short of breath and having abdominal distention as well as lower extremity swelling. She states she has gained a few pounds in the last week as well. She has been taking her Lasix. Patient is also complaining of dry cough however no fevers. She denies a sick contacts or recent travel. Patient states it makes her shortness of breath better or worse. She has not noticed any bloody stools. Patient does state she's had diarrhea for more than a year. In the emergency department, patient was noted to have an elevated BNP as well as a hemoglobin of 8.8. She was found to have a positive fecal occult blood test. Patient was found to be in respiratory distress with oxygenation levels in the 80s. She was placed on supplemental oxygen. TRH called for admission." Daily update, Procedures: Lasix transitioned to oral 11/14/2015 Consultants: none Antibiotics: Anti-infectives    None      Family Communication: no family was present at bedside, at the time of interview.   Disposition:  Expected discharge date: 11/17/2015 Barriers to safe discharge: Hypoxia   Intake/Output Summary (Last 24 hours) at 11/16/15 1704 Last data filed at 11/15/15 2021  Gross per 24 hour  Intake    120 ml  Output      0 ml  Net    120 ml   Filed Weights   11/14/15 0457 11/15/15 0515 11/16/15 0605  Weight: 80.7 kg (177 lb 14.6 oz) 82.2 kg (181 lb 3.5 oz) 82.918 kg (182 lb 12.8 oz)    Objective: Physical Exam: Filed Vitals:   11/16/15 1345 11/16/15 1441 11/16/15 1442  11/16/15 1600  BP: 120/46     Pulse: 60     Temp: 98.8 F (37.1 C)     TempSrc: Oral     Resp: 20     Height:      Weight:      SpO2: 95% 99% 99% 95%    General: Appear in mild distress, no Rash; Oral Mucosa moist. Cardiovascular: S1 and S2 Present, no Murmur, no JVD Respiratory:  Bilateral Air entry present and basal Crackles, expiratory wheezes Abdomen: Bowel Sound present, Soft and no tenderness Extremities: no Pedal edema, no calf tenderness Neurology: Grossly no focal neuro deficit.  Data Reviewed: CBC:  Recent Labs Lab 11/10/15 1441  11/11/15 0256  11/11/15 1206 11/12/15 0221 11/13/15 0257 11/14/15 1544 11/16/15 0312  WBC 5.8  --  5.7  --   --  4.9 5.4 4.1 4.7  NEUTROABS 4.4  --   --   --   --   --   --   --  2.6  HGB 8.8*  < > 9.2*  < > 9.0* 9.3* 8.9* 9.5* 9.6*  HCT 29.2*  < > 29.8*  < > 29.8* 30.6* 30.5* 31.3* 31.1*  MCV 98.6  --  97.4  --   --  97.8 99.7 97.8 97.2  PLT 188  --  185  --   --  198 181 189 169  < > = values in this interval not displayed. Basic Metabolic Panel:  Recent Labs Lab 11/10/15 2227 11/11/15 0256 11/11/15 0654 11/12/15 0221 11/13/15 0257 11/14/15 1544 11/16/15 0312  NA  --  147*  --  144 145 145 142  K  --  3.1*  --  4.0 3.9 3.5 3.9  CL  --  107  --  105 106 104 103  CO2  --  28  --  30 32 32 32  GLUCOSE  --  69  --  217* 177* 175* 214*  BUN  --  43*  --  32* 27* 22* 28*  CREATININE  --  1.22*  --  1.37* 1.50* 1.48* 1.44*  CALCIUM  --  8.5*  --  8.5* 8.5* 8.7* 8.4*  MG 2.2  --  2.3  --   --   --  2.2  PHOS 3.6  --   --   --   --   --   --    Liver Function Tests:  Recent Labs Lab 11/11/15 0256 11/16/15 0312  AST 25 20  ALT 37 24  ALKPHOS 76 77  BILITOT 0.5 0.5  PROT 5.8* 6.1*  ALBUMIN 3.2* 3.2*   No results for input(s): LIPASE, AMYLASE in the last 168 hours. No results for input(s): AMMONIA in the last 168 hours.  Cardiac Enzymes:  Recent Labs Lab 11/10/15 1441 11/10/15 2227 11/11/15 0256 11/11/15 0940  TROPONINI 0.07* 0.09* 0.09* 0.09*    BNP (last 3 results)  Recent Labs  01/06/15 0520 07/27/15 0744 11/10/15 1441  BNP 1061.8* 852.0* 1053.3*    CBG:  Recent Labs Lab 11/15/15 1123 11/15/15 1630 11/15/15 2116 11/16/15 0627 11/16/15 1112  GLUCAP 122* 173* 274* 144* 102*      Recent Results (from the past 240 hour(s))  MRSA PCR Screening     Status: None   Collection Time: 11/10/15  8:11 PM  Result Value Ref Range Status   MRSA by PCR NEGATIVE NEGATIVE Final    Comment:        The GeneXpert MRSA Assay (FDA approved for  NASAL specimens only), is one component of a comprehensive MRSA colonization surveillance program. It is not intended to diagnose MRSA infection nor to guide or monitor treatment for MRSA infections.      Studies: No results found.   Scheduled Meds: . antiseptic oral rinse  7 mL Mouth Rinse BID  . aspirin EC  81 mg Oral Daily  . carvedilol  12.5 mg Oral BID WC  . clopidogrel  75 mg Oral Daily  . dicyclomine  20 mg Oral TID AC  . DULoxetine  60 mg Oral Daily  . ferrous sulfate  325 mg Oral Daily  . gabapentin  300 mg Oral QHS  . guaiFENesin  600 mg Oral BID  . hydrALAZINE  25 mg Oral 3 times per day  . insulin aspart  0-9 Units Subcutaneous TID WC  . insulin glargine  18 Units Subcutaneous QHS  . ipratropium-albuterol  3 mL Nebulization TID  . isosorbide mononitrate  15 mg Oral Daily  . levothyroxine  50 mcg Oral QAC breakfast  . multivitamin with minerals  1 tablet Oral Daily  . saccharomyces boulardii  250 mg Oral BID  . simvastatin  40 mg Oral QPM  . torsemide  40 mg Oral BID   Continuous Infusions:  PRN Meds: acetaminophen **OR** acetaminophen, nitroGLYCERIN, ondansetron **OR** ondansetron (ZOFRAN) IV, phenol-menthol, senna-docusate  Time spent: 30 minutes  Author: Lynden Oxford, MD [facility] Pager: [phone] 11/16/2015 5:04 PM  If 7PM-7AM, please contact night-coverage at [email or web address], password [facility]

## 2015-11-16 NOTE — Evaluation (Signed)
Physical Therapy Evaluation Patient Details Name: [name] MRN: [id number] DOB: [birth year] Today's Date: 11/16/2015   History of Present Illness  Patient is a 70 y/o female with hx of CAD s/p CABG, ischemic cardiomyopathy, DM, HTN, HLD, COPD, prior trachostomy, renal insufficieny presents with SOB, LE swelling and abdominal distention admitted with acute on chronic systolic and diastolic CHF.  Clinical Impression  Patient presents with mild DOE, improved from admission secondary to CHF. Tolerated ambulation Supervision progressing to Mod I with RW for support. Pt has support from daughter at home. Encouraged daily ambulation while in hospital to improve endurance/mobility. Education re: energy conservation techniques, importance of performing short bouts of activity with longer rest breaks etc. Pt does not require further skilled therapy services as pt functioning close to baseline. Discharge from therapy.    Follow Up Recommendations No PT follow up;Supervision - Intermittent    Equipment Recommendations  None recommended by PT    Recommendations for Other Services OT consult     Precautions / Restrictions Precautions Precautions: None Precaution Comments: trach Restrictions Weight Bearing Restrictions: No      Mobility  Bed Mobility               General bed mobility comments: Sitting EOB upon PT arrival.   Transfers Overall transfer level: Needs assistance Equipment used: Rolling walker (2 wheeled) Transfers: Sit to/from Stand Sit to Stand: Modified independent (Device/Increase time)         General transfer comment: No physical assist needed.  Ambulation/Gait Ambulation/Gait assistance: Modified independent (Device/Increase time);Supervision Ambulation Distance (Feet): 150 Feet Assistive device: Rolling walker (2 wheeled) Gait Pattern/deviations: Step-through pattern;Decreased stride length Gait velocity: decreased Gait velocity interpretation: Below  normal speed for age/gender General Gait Details: Steady gait with 2 instances of leaning on RW due to SOB. 2/4 DOE.   Stairs            Wheelchair Mobility    Modified Rankin (Stroke Patients Only)       Balance Overall balance assessment: Needs assistance Sitting-balance support: Feet supported;No upper extremity supported Sitting balance-Leahy Scale: Good     Standing balance support: During functional activity Standing balance-Leahy Scale: Fair Standing balance comment: Able to stand unsupported however pt feels more steady with use of RW.                              Pertinent Vitals/Pain Pain Assessment: Faces Faces Pain Scale: Hurts little more Pain Location: LEs and back with mobility Pain Descriptors / Indicators: Sore Pain Intervention(s): Monitored during session;Repositioned    Home Living Family/patient expects to be discharged to:: Private residence Living Arrangements: Children Available Help at Discharge: Family;Available PRN/intermittently (daughter works during the day) Type of Home: House Home Access: Level entry     Home Layout: One level Home Equipment: Environmental consultant - 2 wheels;Cane - single point;Bedside commode;Shower seat;Hand held shower head      Prior Function Level of Independence: Independent with assistive device(s)         Comments: used cane vs RW, normally RW for community. pt reports she would cook and daughter would clean PTA.      Hand Dominance   Dominant Hand: Right    Extremity/Trunk Assessment   Upper Extremity Assessment: Defer to OT evaluation           Lower Extremity Assessment: Overall WFL for tasks assessed         Communication  Communication: No difficulties  Cognition Arousal/Alertness: Awake/alert Behavior During Therapy: WFL for tasks assessed/performed Overall Cognitive Status: Within Functional Limits for tasks assessed                      General Comments General  comments (skin integrity, edema, etc.): Daughter present for beginning of session. Education re: energy conservation techniques, importance of short bouts of exercise/activity etc.    Exercises        Assessment/Plan    PT Assessment Patent does not need any further PT services  PT Diagnosis Difficulty walking   PT Problem List    PT Treatment Interventions     PT Goals (Current goals can be found in the Care Plan section) Acute Rehab PT Goals PT Goal Formulation: All assessment and education complete, DC therapy    Frequency     Barriers to discharge        Co-evaluation               End of Session Equipment Utilized During Treatment: Gait belt;Oxygen Activity Tolerance: Patient tolerated treatment well Patient left: in bed;with call bell/phone within reach Nurse Communication: Mobility status         Time: 1610-9604 PT Time Calculation (min) (ACUTE ONLY): 17 min   Charges:   PT Evaluation $PT Eval Moderate Complexity: 1 Procedure     PT G Codes:        Zuriel Yeaman A Kandon Hosking 11/16/2015, 4:39 PM Mylo Red, PT, DPT [phone]

## 2015-11-16 NOTE — Care Management Important Message (Signed)
Important Message  Patient Details  Name: [name] MRN: [id number] Date of Birth: 02/23/1946   Medicare Important Message Given:  Yes    Rosalene Wardrop P Allie Gerhold 11/16/2015, 2:12 PM

## 2015-11-17 LAB — CBC
HCT: 29.2 % — ABNORMAL LOW (ref 36.0–46.0)
Hemoglobin: 9 g/dL — ABNORMAL LOW (ref 12.0–15.0)
MCH: 30.1 pg (ref 26.0–34.0)
MCHC: 30.8 g/dL (ref 30.0–36.0)
MCV: 97.7 fL (ref 78.0–100.0)
PLATELETS: 150 10*3/uL (ref 150–400)
RBC: 2.99 MIL/uL — AB (ref 3.87–5.11)
RDW: 16 % — ABNORMAL HIGH (ref 11.5–15.5)
WBC: 4 10*3/uL (ref 4.0–10.5)

## 2015-11-17 LAB — BASIC METABOLIC PANEL
Anion gap: 8 (ref 5–15)
BUN: 28 mg/dL — AB (ref 6–20)
CHLORIDE: 103 mmol/L (ref 101–111)
CO2: 33 mmol/L — AB (ref 22–32)
CREATININE: 1.35 mg/dL — AB (ref 0.44–1.00)
Calcium: 8.3 mg/dL — ABNORMAL LOW (ref 8.9–10.3)
GFR calc non Af Amer: 39 mL/min — ABNORMAL LOW (ref >60)
GFR, EST AFRICAN AMERICAN: 45 mL/min — AB (ref >60)
GLUCOSE: 126 mg/dL — AB (ref 65–99)
Potassium: 3.8 mmol/L (ref 3.5–5.1)
Sodium: 144 mmol/L (ref 135–145)

## 2015-11-17 LAB — BRAIN NATRIURETIC PEPTIDE: B Natriuretic Peptide: 982.1 pg/mL — ABNORMAL HIGH (ref 0.0–100.0)

## 2015-11-17 LAB — GLUCOSE, CAPILLARY
GLUCOSE-CAPILLARY: 168 mg/dL — AB (ref 65–99)
Glucose-Capillary: 124 mg/dL — ABNORMAL HIGH (ref 65–99)
Glucose-Capillary: 342 mg/dL — ABNORMAL HIGH (ref 65–99)
Glucose-Capillary: 290 mg/dL — ABNORMAL HIGH (ref 65–99)

## 2015-11-17 LAB — MAGNESIUM: MAGNESIUM: 2.1 mg/dL (ref 1.7–2.4)

## 2015-11-17 MED ORDER — METOLAZONE 2.5 MG PO TABS
2.5000 mg | ORAL_TABLET | Freq: Once | ORAL | Status: AC
  Administered 2015-11-17: 2.5 mg via ORAL
  Filled 2015-11-17: qty 1

## 2015-11-17 MED ORDER — ENOXAPARIN SODIUM 40 MG/0.4ML ~~LOC~~ SOLN
40.0000 mg | SUBCUTANEOUS | Status: DC
  Administered 2015-11-17 – 2015-11-18 (×2): 40 mg via SUBCUTANEOUS
  Filled 2015-11-17 (×2): qty 0.4

## 2015-11-17 NOTE — Progress Notes (Signed)
[facility] Progress Note  Patient: [name] ZOX:[id number]   PCP: Cala Bradford, MD DOB: June 18, 1946   DOA: 11/10/2015   DOS: 11/17/2015   Date of Service: the patient was seen and examined on 11/17/2015  Subjective: Mentions her shortness of breath is 50% better but not close to her baseline. Her swelling has improved in the leg. No diarrhea. Nutrition: Able to tolerate oral diet Activity: Ambulating in the room Last BM: 11/16/2015  Assessment and Plan: 1. Acute on chronic combined systolic and diastolic CHF (congestive heart failure) (HCC) Inaccurate documentation of her ins and outs. Weight has gone up. BNP is 990 Continue with torsemide, give Zaroxolyn. Concerning cartilage for further input Continue aspirin and Plavix Coreg and Imdur hydralazine and statin. Check BMP to monitor renal function.  2. Anemia of chronic disease.  Chronic kidney disease stage III. Monitor renal function. H&H remained stable. Most likely hemorrhoidal bleeding. Continue iron supplementation discontinue PPI.  3. Tracheostomy status. Continue trach collar as needed.  4. Coronary artery disease, chronic troponin. Continue aspirin and Plavix and statin and beta blocker.  5. Type 2 diabetes mellitus. Last hemoglobin A1c 9.31 July 2015. Continue sliding scale insulin. Continue long-acting Lantus to 18.  6. Diarrhea. Resolved at present No evidence of abdominal tenderness or fever or infection.  7. COPD. Continue nebulizers to the regimen. Continue with flutter device.  8. Bradycardia arrhythmia. PVCs The patient had first-degree AV block on telemetry, she occasionally has skipped beats as well. Her Coreg was reduced from 25 mg twice a day to 12.5 mg twice a day. Now she has some PVC runs on telemetry. Consulting cardiology of for further input.  DVT Prophylaxis: Mechanical compression device Nutrition: Cardiac diet Advance goals of care discussion: DNR/DNI  Brief Summary of  Hospitalization:  HPI: As per the H and P dictated on admission, "With a history of heart failure, diabetes, coronary artery disease, tracheostomy, that presented to the emergency department with complaints of shortness of breath. Over the last week, patient has been more short of breath and having abdominal distention as well as lower extremity swelling. She states she has gained a few pounds in the last week as well. She has been taking her Lasix. Patient is also complaining of dry cough however no fevers. She denies a sick contacts or recent travel. Patient states it makes her shortness of breath better or worse. She has not noticed any bloody stools. Patient does state she's had diarrhea for more than a year. In the emergency department, patient was noted to have an elevated BNP as well as a hemoglobin of 8.8. She was found to have a positive fecal occult blood test. Patient was found to be in respiratory distress with oxygenation levels in the 80s. She was placed on supplemental oxygen. TRH called for admission." Daily update, Procedures: Lasix transitioned to oral 11/14/2015, cardiac was consulted 11/17/2015 Consultants: Cardiology Antibiotics: Anti-infectives    None      Family Communication: no family was present at bedside, at the time of interview.   Disposition:  Expected discharge date: 11/19/2015 Barriers to safe discharge: Hypoxia   Intake/Output Summary (Last 24 hours) at 11/17/15 1317 Last data filed at 11/17/15 0815  Gross per 24 hour  Intake    360 ml  Output    400 ml  Net    -40 ml   Filed Weights   11/15/15 0515 11/16/15 0605 11/17/15 0532  Weight: 82.2 kg (181 lb 3.5 oz) 82.918 kg (182 lb 12.8  oz) 83 kg (182 lb 15.7 oz)    Objective: Physical Exam: Filed Vitals:   11/17/15 0312 11/17/15 0532 11/17/15 0719 11/17/15 1222  BP:  128/54    Pulse: 66 67 65 63  Temp:  97.7 F (36.5 C)    TempSrc:  Oral    Resp: Height:      Weight:  83 kg (182 lb  15.7 oz)    SpO2:  95% 98% 100%    General: Appear in mild distress, no Rash; Oral Mucosa moist. Cardiovascular: S1 and S2 Present, no Murmur, no JVD Respiratory: Bilateral Air entry present and basal Crackles, persistent expiratory wheezes Abdomen: Bowel Sound present, Soft and no tenderness Extremities: no Pedal edema, no calf tenderness  Data Reviewed: CBC:  Recent Labs Lab 11/10/15 1441  11/12/15 0221 11/13/15 0257 11/14/15 1544 11/16/15 0312 11/17/15 0756  WBC 5.8  < > 4.9 5.4 4.1 4.7 4.0  NEUTROABS 4.4  --   --   --   --  2.6  --   HGB 8.8*  < > 9.3* 8.9* 9.5* 9.6* 9.0*  HCT 29.2*  < > 30.6* 30.5* 31.3* 31.1* 29.2*  MCV 98.6  < > 97.8 99.7 97.8 97.2 97.7  PLT 188  < > 198 181 189 169 150  < > = values in this interval not displayed. Basic Metabolic Panel:  Recent Labs Lab 11/10/15 2227  11/11/15 0654 11/12/15 0221 11/13/15 0257 11/14/15 1544 11/16/15 0312 11/17/15 0756  NA  --   < >  --  144 145 145 142 144  K  --   < >  --  4.0 3.9 3.5 3.9 3.8  CL  --   < >  --  105 106 104 103 103  CO2  --   < >  --  30 32 32 32 33*  GLUCOSE  --   < >  --  217* 177* 175* 214* 126*  BUN  --   < >  --  32* 27* 22* 28* 28*  CREATININE  --   < >  --  1.37* 1.50* 1.48* 1.44* 1.35*  CALCIUM  --   < >  --  8.5* 8.5* 8.7* 8.4* 8.3*  MG 2.2  --  2.3  --   --   --  2.2 2.1  PHOS 3.6  --   --   --   --   --   --   --   < > = values in this interval not displayed. Liver Function Tests:  Recent Labs Lab 11/11/15 0256 11/16/15 0312  AST 25 20  ALT 37 24  ALKPHOS 76 77  BILITOT 0.5 0.5  PROT 5.8* 6.1*  ALBUMIN 3.2* 3.2*   No results for input(s): LIPASE, AMYLASE in the last 168 hours. No results for input(s): AMMONIA in the last 168 hours.  Cardiac Enzymes:  Recent Labs Lab 11/10/15 1441 11/10/15 2227 11/11/15 0256 11/11/15 0940  TROPONINI 0.07* 0.09* 0.09* 0.09*    BNP (last 3 results)  Recent Labs  07/27/15 0744 11/10/15 1441 11/17/15 0750  BNP 852.0*  1053.3* 982.1*    CBG:  Recent Labs Lab 11/16/15 1112 11/16/15 1709 11/16/15 2120 11/17/15 0630 11/17/15 1134  GLUCAP 102* 146* 172* 124* 168*    Recent Results (from the past 240 hour(s))  MRSA PCR Screening     Status: None   Collection Time: 11/10/15  8:11 PM  Result Value Ref Range Status  MRSA by PCR NEGATIVE NEGATIVE Final    Comment:        The GeneXpert MRSA Assay (FDA approved for NASAL specimens only), is one component of a comprehensive MRSA colonization surveillance program. It is not intended to diagnose MRSA infection nor to guide or monitor treatment for MRSA infections.      Studies: No results found.   Scheduled Meds: . antiseptic oral rinse  7 mL Mouth Rinse BID  . aspirin EC  81 mg Oral Daily  . carvedilol  12.5 mg Oral BID WC  . clopidogrel  75 mg Oral Daily  . dicyclomine  20 mg Oral TID AC  . DULoxetine  60 mg Oral Daily  . ferrous sulfate  325 mg Oral Daily  . gabapentin  300 mg Oral QHS  . guaiFENesin  600 mg Oral BID  . hydrALAZINE  25 mg Oral 3 times per day  . insulin aspart  0-9 Units Subcutaneous TID WC  . insulin glargine  18 Units Subcutaneous QHS  . ipratropium-albuterol  3 mL Nebulization TID  . isosorbide mononitrate  15 mg Oral Daily  . levothyroxine  50 mcg Oral QAC breakfast  . multivitamin with minerals  1 tablet Oral Daily  . saccharomyces boulardii  250 mg Oral BID  . simvastatin  40 mg Oral QPM  . torsemide  40 mg Oral BID   Continuous Infusions:  PRN Meds: acetaminophen **OR** acetaminophen, nitroGLYCERIN, ondansetron **OR** ondansetron (ZOFRAN) IV, phenol-menthol, senna-docusate  Time spent: 30 minutes  Author: Lynden Oxford, MD [facility] Pager: [phone] 11/17/2015 1:17 PM  If 7PM-7AM, please contact night-coverage at [email or web address], password [facility]

## 2015-11-17 NOTE — Progress Notes (Signed)
Patient had 7 beats run of pvc this morning, patient resting comfortably in bed with no complaints, vss. Continue to monitor.

## 2015-11-17 NOTE — Progress Notes (Signed)
PHARMACY - LOVENOX FOR VTE PROPHYLAXIS  Weight 83 kg SCr 1.35, est CrCl 39 ml/min Hb 9's stable, platelets normal  Lovenox 40 mg SQ daily Pharmacy s/o  [facility] [place], [address].D., BCPS Clinical Pharmacist Pager: [phone] 11/17/2015 1:48 PM

## 2015-11-18 ENCOUNTER — Encounter: Payer: Self-pay | Admitting: Cardiology

## 2015-11-18 DIAGNOSIS — N183 Chronic kidney disease, stage 3 (moderate): Secondary | ICD-10-CM

## 2015-11-18 DIAGNOSIS — N179 Acute kidney failure, unspecified: Secondary | ICD-10-CM

## 2015-11-18 DIAGNOSIS — R7989 Other specified abnormal findings of blood chemistry: Secondary | ICD-10-CM

## 2015-11-18 DIAGNOSIS — I2510 Atherosclerotic heart disease of native coronary artery without angina pectoris: Secondary | ICD-10-CM

## 2015-11-18 DIAGNOSIS — J9621 Acute and chronic respiratory failure with hypoxia: Secondary | ICD-10-CM

## 2015-11-18 DIAGNOSIS — I5043 Acute on chronic combined systolic (congestive) and diastolic (congestive) heart failure: Secondary | ICD-10-CM

## 2015-11-18 DIAGNOSIS — I10 Essential (primary) hypertension: Secondary | ICD-10-CM

## 2015-11-18 LAB — BASIC METABOLIC PANEL
Anion gap: 11 (ref 5–15)
BUN: 33 mg/dL — AB (ref 6–20)
CALCIUM: 8.4 mg/dL — AB (ref 8.9–10.3)
CO2: 31 mmol/L (ref 22–32)
CREATININE: 1.54 mg/dL — AB (ref 0.44–1.00)
Chloride: 92 mmol/L — ABNORMAL LOW (ref 101–111)
GFR calc Af Amer: 39 mL/min — ABNORMAL LOW (ref >60)
GFR, EST NON AFRICAN AMERICAN: 33 mL/min — AB (ref >60)
GLUCOSE: 190 mg/dL — AB (ref 65–99)
Potassium: 3.6 mmol/L (ref 3.5–5.1)
Sodium: 134 mmol/L — ABNORMAL LOW (ref 135–145)

## 2015-11-18 LAB — CBC
HCT: 32.1 % — ABNORMAL LOW (ref 36.0–46.0)
Hemoglobin: 9.8 g/dL — ABNORMAL LOW (ref 12.0–15.0)
MCH: 29.4 pg (ref 26.0–34.0)
MCHC: 30.5 g/dL (ref 30.0–36.0)
MCV: 96.4 fL (ref 78.0–100.0)
PLATELETS: 164 10*3/uL (ref 150–400)
RBC: 3.33 MIL/uL — ABNORMAL LOW (ref 3.87–5.11)
RDW: 15.7 % — AB (ref 11.5–15.5)
WBC: 5 10*3/uL (ref 4.0–10.5)

## 2015-11-18 LAB — GLUCOSE, CAPILLARY
Glucose-Capillary: 146 mg/dL — ABNORMAL HIGH (ref 65–99)
Glucose-Capillary: 219 mg/dL — ABNORMAL HIGH (ref 65–99)
Glucose-Capillary: 260 mg/dL — ABNORMAL HIGH (ref 65–99)
Glucose-Capillary: 323 mg/dL — ABNORMAL HIGH (ref 65–99)

## 2015-11-18 LAB — MAGNESIUM: Magnesium: 2.2 mg/dL (ref 1.7–2.4)

## 2015-11-18 NOTE — Progress Notes (Signed)
[facility]                                                                              Patient Demographics  [name], is a 70 y.o. female, DOB - 28-Jun-1946, ZOX:[id number]  Admit date - 11/10/2015   Admitting Physician Edsel Petrin, DO  Outpatient Primary MD for the patient is Cala Bradford, MD  LOS - 8   Chief Complaint  Patient presents with  . Shortness of Breath       Brief HPI   Patient is a 70 year old female withCHF, DM, CAD, tracheostomy, that presented to the emergency department with complaints of shortness of breath. Over the last week, patient was more dyspneic and having abdominal distention as well as lower extremity swelling. She statedshe has gained a few pounds in the last week as well. She had been taking her Lasix. Patient was also complaining of dry cough however no fevers. Patient does state she's had diarrhea for more than a year. In the emergency department, patient was noted to have an elevated BNP as well as a hemoglobin of 8.8. She was found to have a positive fecal occult blood test. Patient was found to be in respiratory distress with oxygenation levels in the 80s. She was placed on supplemental oxygen. [facility] called for admission.   Assessment & Plan   Acute hypoxic respiratory failure secondary to Acute on chronic combined systolic and diastolic CHF (congestive heart failure) (HCC) - Patient was placed on IV Lasix, subsequently transitioned to torsemide and Zaroxolyn. Negative balance of 6.1 L, weight down from 184 to 182lbs only  - Creatinine trending up, BNP was elevated to 982 yesterday, called cardiology for admonitions - Continue aspirin, Plavix, Coreg, Imdur, hydralazine and statin  Anemia of chronic disease. H&H remained stable. Most likely hemorrhoidal bleeding. Continue iron supplementation  Mild acute on chronic kidney disease stage III - Patient now placed on torsemide, creatinine trending up  Tracheostomy  status. Continue trach collar as needed.  Coronary artery disease, chronic troponin. Continue aspirin, Plavix, beta blocker, statin.  Type 2 diabetes mellitus. Last hemoglobin A1c 9.31 July 2015. Continue sliding scale insulin, Lantus   Diarrhea. Resolved at present  COPD. Continue nebulizers to the regimen, RT for trach, continue flutter.  PVCs with bradycardia - Coreg was decreased to 12.5 mg BID  Code Status: DO NOT RESUSCITATE  Family Communication: Discussed in detail with the patient, all imaging results, lab results explained to the patient   Disposition Plan  Time Spent in minutes   25 minutes  Procedures  none  Consults   Cardiology, called 1/25  DVT Prophylaxis  Lovenox   Medications  Scheduled Meds: . antiseptic oral rinse  7 mL Mouth Rinse BID  . aspirin EC  81 mg Oral Daily  . carvedilol  12.5 mg Oral BID WC  . clopidogrel  75 mg Oral Daily  . dicyclomine  20 mg Oral TID AC  . DULoxetine  60 mg Oral Daily  . enoxaparin (LOVENOX) injection  40 mg Subcutaneous Q24H  . ferrous sulfate  325 mg Oral Daily  . gabapentin  300 mg Oral QHS  .  guaiFENesin  600 mg Oral BID  . hydrALAZINE  25 mg Oral 3 times per day  . insulin aspart  0-9 Units Subcutaneous TID WC  . insulin glargine  18 Units Subcutaneous QHS  . ipratropium-albuterol  3 mL Nebulization TID  . isosorbide mononitrate  15 mg Oral Daily  . levothyroxine  50 mcg Oral QAC breakfast  . multivitamin with minerals  1 tablet Oral Daily  . saccharomyces boulardii  250 mg Oral BID  . simvastatin  40 mg Oral QPM  . torsemide  40 mg Oral BID   Continuous Infusions:  PRN Meds:.acetaminophen **OR** acetaminophen, nitroGLYCERIN, ondansetron **OR** ondansetron (ZOFRAN) IV, phenol-menthol, senna-docusate   Antibiotics   Anti-infectives    None        Subjective:   [name] was seen and examined today. Shortness of breath improving, still does not feel back to baseline, peripheral edema  improving. Patient denies dizziness, chest pain, abdominal pain, N/V/D/C, new weakness, numbess, tingling. No acute events overnight.    Objective:   Blood pressure 129/68, pulse 67, temperature 98.2 F (36.8 C), temperature source Oral, resp. rate 18, height  (1.575 m), weight 83 kg (182 lb 15.7 oz), SpO2 97 %.  Wt Readings from Last 3 Encounters:  11/17/15 83 kg (182 lb 15.7 oz)  11/04/15 83.462 kg (184 lb)  10/16/15 83.008 kg (183 lb)     Intake/Output Summary (Last 24 hours) at 11/18/15 1102 Last data filed at 11/18/15 0820  Gross per 24 hour  Intake    720 ml  Output   5200 ml  Net  -4480 ml    Exam  General: Alert and oriented x 3, NAD  HEENT:  PERRLA, EOMI, Anicteric Sclera, mucous membranes moist.   Neck: Supple, trach+  CVS: S1 S2 auscultated, no rubs, murmurs or gallops. Regular rate and rhythm.  Respiratory: Decreased breath under the bases  Abdomen: Soft, nontender, nondistended, + bowel sounds  Ext: no cyanosis clubbing or edema  Neuro: AAOx3, Cr N's II- XII. Strength 5/5 upper and lower extremities bilaterally  Skin: No rashes  Psych: Normal affect and demeanor, alert and oriented x3    Data Review   Micro Results Recent Results (from the past 240 hour(s))  MRSA PCR Screening     Status: None   Collection Time: 11/10/15  8:11 PM  Result Value Ref Range Status   MRSA by PCR NEGATIVE NEGATIVE Final    Comment:        The GeneXpert MRSA Assay (FDA approved for NASAL specimens only), is one component of a comprehensive MRSA colonization surveillance program. It is not intended to diagnose MRSA infection nor to guide or monitor treatment for MRSA infections.     Radiology Reports Dg Chest 2 View  11/15/2015  CLINICAL DATA:  Shortness of breath, congestive heart failure EXAM: CHEST  2 VIEW COMPARISON:  11/11/2015 FINDINGS: Tracheostomy tube is unchanged in position. Central vascular congestion again noted. Status post median sternotomy.  There is mild interstitial prominence bilateral consistent with mild interstitial edema with slight improvement from prior exam. Persistent streaky atelectasis or scarring right base and lingula. IMPRESSION: There is mild interstitial prominence bilateral consistent with mild interstitial edema with slight improvement from prior exam. Persistent streaky atelectasis or scarring right base and lingula. Electronically Signed   By: Natasha Mead M.D.   On: 11/15/2015 15:00   Dg Chest 2 View  11/11/2015  CLINICAL DATA:  Shortness of breath for 2 days. Has tracheostomy tube. EXAM:  CHEST  2 VIEW COMPARISON:  11/10/2015 FINDINGS: Tracheostomy tube remains in stable position. Prior median sternotomy. Cardiomegaly with bilateral interstitial and alveolar opacities most compatible with edema/CHF no change. No significant effusions. IMPRESSION: Moderate pulmonary edema/CHF. Electronically Signed   By: Charlett Nose M.D.   On: 11/11/2015 08:20   Dg Chest 2 View  11/10/2015  CLINICAL DATA:  Shortness of breath for 2 days.  Cough for 1 week EXAM: CHEST  2 VIEW COMPARISON:  May 28, 2015 FINDINGS: There is cardiomegaly with pulmonary venous hypertension. There is generalized interstitial edema, stable. There is atelectatic change in the superior lingula, stable. No new opacity. Tracheostomy catheter tip is 3.6 cm above the carina. No pneumothorax. There is marked collapse of the L1 vertebral body, stable. IMPRESSION: Findings indicative of chronic congestive heart failure. Stable atelectatic change in the superior lingula. Overall no appreciable change compared to prior study. Electronically Signed   By: Bretta Bang III M.D.   On: 11/10/2015 14:17    CBC  Recent Labs Lab 11/13/15 0257 11/14/15 1544 11/16/15 0312 11/17/15 0756 11/18/15 0257  WBC 5.4 4.1 4.7 4.0 5.0  HGB 8.9* 9.5* 9.6* 9.0* 9.8*  HCT 30.5* 31.3* 31.1* 29.2* 32.1*  PLT 181 189 169 150 164  MCV 99.7 97.8 97.2 97.7 96.4  MCH 29.1 29.7 30.0 30.1  29.4  MCHC 29.2* 30.4 30.9 30.8 30.5  RDW 16.6* 16.5* 16.2* 16.0* 15.7*  LYMPHSABS  --   --  0.7  --   --   MONOABS  --   --  0.8  --   --   EOSABS  --   --  0.6  --   --   BASOSABS  --   --  0.0  --   --     Chemistries   Recent Labs Lab 11/13/15 0257 11/14/15 1544 11/16/15 0312 11/17/15 0756 11/18/15 0257  NA 145 145 142 144 134*  K 3.9 3.5 3.9 3.8 3.6  CL 106 104 103 103 92*  CO2 32 32 32 33* 31  GLUCOSE 177* 175* 214* 126* 190*  BUN 27* 22* 28* 28* 33*  CREATININE 1.50* 1.48* 1.44* 1.35* 1.54*  CALCIUM 8.5* 8.7* 8.4* 8.3* 8.4*  MG  --   --  2.2 2.1 2.2  AST  --   --  20  --   --   ALT  --   --  24  --   --   ALKPHOS  --   --  77  --   --   BILITOT  --   --  0.5  --   --    ------------------------------------------------------------------------------------------------------------------ estimated creatinine clearance is 34.5 mL/min (by C-G formula based on Cr of 1.54). ------------------------------------------------------------------------------------------------------------------ No results for input(s): HGBA1C in the last 72 hours. ------------------------------------------------------------------------------------------------------------------ No results for input(s): CHOL, HDL, LDLCALC, TRIG, CHOLHDL, LDLDIRECT in the last 72 hours. ------------------------------------------------------------------------------------------------------------------ No results for input(s): TSH, T4TOTAL, T3FREE, THYROIDAB in the last 72 hours.  Invalid input(s): FREET3 ------------------------------------------------------------------------------------------------------------------ No results for input(s): VITAMINB12, FOLATE, FERRITIN, TIBC, IRON, RETICCTPCT in the last 72 hours.  Coagulation profile  Recent Labs Lab 11/16/15 0312  INR 1.22    No results for input(s): DDIMER in the last 72 hours.  Cardiac Enzymes No results for input(s): CKMB, TROPONINI, MYOGLOBIN in the  last 168 hours.  Invalid input(s): CK ------------------------------------------------------------------------------------------------------------------ Invalid input(s): POCBNP   Recent Labs  11/16/15 2120 11/17/15 0630 11/17/15 1134 11/17/15 1624 11/17/15 2118 11/18/15 0604  GLUCAP 172* 124* 168* 342*  290* 146*     Sophiagrace Benbrook M.D. [facility] 11/18/2015, 11:02 AM  Pager: [phone] Between 7am to 7pm - call Pager - [phone]  After 7pm go to [email or web address] - password [email or web address]  Call night coverage person covering after 7pm

## 2015-11-18 NOTE — Consult Note (Addendum)
Cardiologist:  [name] Reason for Consult: Congestive heart failure Referring Physician: Tykeshia [name] is an 70 y.o. female.  HPI:   [name] is a 70 y.o. female with a hx of CAD status post CABG, ischemic cardiomyopathy, chronic systolic CHF, COPD, diabetes, PAD, HL, CKD stage III, HTN. EF has been as low as 20% in the past. Most recent echo in 3/16 demonstrated EF 50%. LHC in 12/2014 demonstrated patent left main, occluded LAD and RCA, nonobstructive disease in the LCx, occluded SVG-RCA, patent SVG to the LAD with 70% stenosis beyond graft. There were no good target for PCI and medical therapy was recommended. She is on chronic O2 secondary to COPD and systolic [name]. She is status post left LE bypass by Dr. Oneida [name]. 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   Admitted 07/2015 with a/c hypoxic respiratory failure in the setting of a/c combined systolic and diastolic [name] and tracheobronchitis. She was treated with a combination of IV diuretics and antibiotics. Troponin levels were minimally elevated without clear trend. She was seen by cardiology.  2-D echocardiogram during that admission revealed an ejection fraction of 35-40% with akinesis of the basal inferior myocardium. She has grade 1 diastolic dysfunction.  Mild MR. Left atrium moderately dilated. This ejection fraction is similar to previous echo.   She has been seen by the nurse practitioner with [name] (a service of [place], NP).  This treatment was admitted on January 17 with shortness of breath.  Her weight at admission was 184 pounds decreased to about 177 but then remained stable around 182.(I doubt 161 weight charted on the 19th was accurate).  Ins and outs do not appear accurate however in the last day she had a tremendous amount of output. Serum creatinine is range from 1.37-1.54.  BNP yesterday was 982.1.  She was initially treated with IV Lasix and  then switched to by mouth torsemide. Metolazone 2.33m was given yesterday which precipitated a large amount of diuresis.  Ms. BCanadyreports that she is breathing much better today and was up most of the night urinating.  She slept with the bed at about 20 last night and when she came in she had to be completely upright. When she first came in, she said her face, fingers abdomen and legs were swollen.  She could not reach her feet to put on her shoes and now she can.  The patient currently denies nausea, vomiting, fever, chest pain, dizziness, PND, cough, congestion, abdominal pain, hematochezia, melena, claudication.     Past Medical History  Diagnosis Date  . Diabetes mellitus     on Lantus 30 U   . CAD (coronary artery disease)     a. S/p CABG and stenting b. L&R cath 01/09/15 LM patent, LAD 100% occluded, LCX moderate disease, RCA 100% occluded. SVG-RCA CTO, SVG-mid LAD patent with more distal LAD 70% stenosis. Recs for medical management.   .Marland KitchenPAD (peripheral artery disease) ([facility]   . St[facility] ([facility]     MRI 11/2011 with remote occipital lobe. MRA with moderate left focal vertebral artery stenosis  . Chronic combined systolic and diastolic CHF (congestive heart failure) ([place]     a. 11/2011 Echo with EF 30-35%, global hypokinesis, and inferior akinesis;  b. 12/2014 Echo: EF 50%, mod LVH, Ao sclerosis w/o stenosis, mildly dil LA, mild to mod RV dysfxn.  . Hyperlipidemia   . DDD (degenerative disc disease),  lumbar   . MI (myocardial infarction) ([place]) 1997  . COPD (chronic obstructive pulmonary disease) (H[facility])     a. prn and HS supplemental O2.  . Anemia   . Hypertension   . GERD (gastroesophageal reflux disease)   . History of IBS   . Hypothyroidism     Goiter  . Carotid artery occlusion   . CKD (chronic kidney disease), stage III     stage 3  . History of tracheostomy ([place]) 08/26/2013    Dr. Redmond [name]  . Shortness of breath dyspnea     Past Surgical History  Procedure Laterality Date  .  Ptca    . Thyroidectomy    . Coronary artery bypass graft      2 vessel  . Carotid endarterectomy  ~2008    Left   . Cholecystectomy    . Tracheostomy tube placement  01/02/2012  . Angioplasty  [id number]    Aortogram by Dr. Mali McKenzie [facility])  . Pr vein bypass graft,aorto-fem-pop      Right common femoral-AK popliteal BPG & Right Popliteal-posterior tibial  . Pr vein bypass graft,aorto-fem-pop      Left Fem-pop BPG  . Carpal tunnel release Right   . Femoral-tibial bypass graft Left 06/11/2013    Procedure: BYPASS GRAFT  LEFT FEMORAL- POSTERIOR TIBIAL ARTERY/ REDO;  Surgeon: Elam Dutch, MD;  Location: [place];  Service: Vascular;  Laterality: Left;  . Thrombectomy femoral artery Left 06/11/2013    Procedure: THROMBECTOMY FEMORAL ARTERY;  Surgeon: Elam Dutch, MD;  Location: [place];  Service: Vascular;  Laterality: Left;  . Spine surgery  Oct. 27, 2014    Injection - Back  . Abdominal aortagram N/A 04/05/2013    Procedure: ABDOMINAL Maxcine Ham;  Surgeon: Elam Dutch, MD;  Location: [facility] CATH LAB;  Service: Cardiovascular;  Laterality: N/A;  . Colonoscopy N/A 10/27/2014    Procedure: COLONOSCOPY;  Surgeon: Arta Silence, MD;  Location: [facility] ENDOSCOPY;  Service: Endoscopy;  Laterality: N/A;  . Left and right heart catheterization with coronary angiogram N/A 01/09/2015    Procedure: LEFT AND RIGHT HEART CATHETERIZATION WITH CORONARY ANGIOGRAM;  Surgeon: Leonie Man, MD;  Location: [facility] CATH LAB;  Service: Cardiovascular;  Laterality: N/A;    Family History  Problem Relation Age of Onset  . Hyperlipidemia Mother   . Other Mother     AAA  . Alzheimer's disease Mother   . Heart disease Mother   . Irregular heart beat Mother   . Diabetes Daughter   . Hypertension Daughter     Social History:  reports that she quit smoking about 3 years ago. Her smoking use included Cigarettes. She has a 50 pack-year smoking history. She has never used smokeless tobacco. She reports that  she does not drink alcohol or use illicit drugs.  Allergies:  Allergies  Allergen Reactions  . Aldactone [Spironolactone] Other (See Comments)    Severe hyperkalemia   . Lisinopril Other (See Comments) and Cough    Hypotension also  . Crestor [Rosuvastatin Calcium] Other (See Comments)    Muscle Pain  . Vicodin [Hydrocodone-Acetaminophen] Nausea And Vomiting    Medications:  Scheduled Meds: . antiseptic oral rinse  7 mL Mouth Rinse BID  . aspirin EC  81 mg Oral Daily  . carvedilol  12.5 mg Oral BID WC  . clopidogrel  75 mg Oral Daily  . dicyclomine  20 mg Oral TID AC  . DULoxetine  60 mg Oral Daily  . enoxaparin (LOVENOX)  injection  40 mg Subcutaneous Q24H  . ferrous sulfate  325 mg Oral Daily  . gabapentin  300 mg Oral QHS  . guaiFENesin  600 mg Oral BID  . hydrALAZINE  25 mg Oral 3 times per day  . insulin aspart  0-9 Units Subcutaneous TID WC  . insulin glargine  18 Units Subcutaneous QHS  . ipratropium-albuterol  3 mL Nebulization TID  . isosorbide mononitrate  15 mg Oral Daily  . levothyroxine  50 mcg Oral QAC breakfast  . multivitamin with minerals  1 tablet Oral Daily  . saccharomyces boulardii  250 mg Oral BID  . simvastatin  40 mg Oral QPM  . torsemide  40 mg Oral BID   Continuous Infusions:  PRN Meds:.acetaminophen **OR** acetaminophen, nitroGLYCERIN, ondansetron **OR** ondansetron (ZOFRAN) IV, phenol-menthol, senna-docusate   Results for orders placed or performed during the hospital encounter of 11/10/15 (from the past 48 hour(s))  Glucose, capillary     Status: Abnormal   Collection Time: 11/16/15  5:09 PM  Result Value Ref Range   Glucose-Capillary 146 (H) 65 - 99 mg/dL   Comment 1 Notify RN    Comment 2 Document in Chart   Glucose, capillary     Status: Abnormal   Collection Time: 11/16/15  9:20 PM  Result Value Ref Range   Glucose-Capillary 172 (H) 65 - 99 mg/dL   Comment 1 Notify RN    Comment 2 Document in Chart   Glucose, capillary     Status:  Abnormal   Collection Time: 11/17/15  6:30 AM  Result Value Ref Range   Glucose-Capillary 124 (H) 65 - 99 mg/dL   Comment 1 Notify RN    Comment 2 Document in Chart   Brain natriuretic peptide     Status: Abnormal   Collection Time: 11/17/15  7:50 AM  Result Value Ref Range   B Natriuretic Peptide 982.1 (H) 0.0 - 100.0 pg/mL  Basic metabolic panel     Status: Abnormal   Collection Time: 11/17/15  7:56 AM  Result Value Ref Range   Sodium 144 135 - 145 mmol/L   Potassium 3.8 3.5 - 5.1 mmol/L   Chloride 103 101 - 111 mmol/L   CO2 33 (H) 22 - 32 mmol/L   Glucose, Bld 126 (H) 65 - 99 mg/dL   BUN 28 (H) 6 - 20 mg/dL   Creatinine, Ser 1.35 (H) 0.44 - 1.00 mg/dL   Calcium 8.3 (L) 8.9 - 10.3 mg/dL   GFR calc non Af Amer 39 (L) >60 mL/min   GFR calc Af Amer 45 (L) >60 mL/min    Comment: (NOTE) The eGFR has been calculated using the CKD EPI equation. This calculation has not been validated in all clinical situations. eGFR's persistently <60 mL/min signify possible Chronic Kidney Disease.    Anion gap 8 5 - 15  CBC     Status: Abnormal   Collection Time: 11/17/15  7:56 AM  Result Value Ref Range   WBC 4.0 4.0 - 10.5 K/uL   RBC 2.99 (L) 3.87 - 5.11 MIL/uL   Hemoglobin 9.0 (L) 12.0 - 15.0 g/dL   HCT 29.2 (L) 36.0 - 46.0 %   MCV 97.7 78.0 - 100.0 fL   MCH 30.1 26.0 - 34.0 pg   MCHC 30.8 30.0 - 36.0 g/dL   RDW 16.0 (H) 11.5 - 15.5 %   Platelets 150 150 - 400 K/uL  Magnesium     Status: None   Collection Time: 11/17/15  7:56 AM  Result Value Ref Range   Magnesium 2.1 1.7 - 2.4 mg/dL  Glucose, capillary     Status: Abnormal   Collection Time: 11/17/15 11:34 AM  Result Value Ref Range   Glucose-Capillary 168 (H) 65 - 99 mg/dL   Comment 1 Notify RN   Glucose, capillary     Status: Abnormal   Collection Time: 11/17/15  4:24 PM  Result Value Ref Range   Glucose-Capillary 342 (H) 65 - 99 mg/dL   Comment 1 Notify RN   Glucose, capillary     Status: Abnormal   Collection Time:  11/17/15  9:18 PM  Result Value Ref Range   Glucose-Capillary 290 (H) 65 - 99 mg/dL  Magnesium     Status: None   Collection Time: 11/18/15  2:57 AM  Result Value Ref Range   Magnesium 2.2 1.7 - 2.4 mg/dL  CBC     Status: Abnormal   Collection Time: 11/18/15  2:57 AM  Result Value Ref Range   WBC 5.0 4.0 - 10.5 K/uL   RBC 3.33 (L) 3.87 - 5.11 MIL/uL   Hemoglobin 9.8 (L) 12.0 - 15.0 g/dL   HCT 32.1 (L) 36.0 - 46.0 %   MCV 96.4 78.0 - 100.0 fL   MCH 29.4 26.0 - 34.0 pg   MCHC 30.5 30.0 - 36.0 g/dL   RDW 15.7 (H) 11.5 - 15.5 %   Platelets 164 150 - 400 K/uL  Basic metabolic panel     Status: Abnormal   Collection Time: 11/18/15  2:57 AM  Result Value Ref Range   Sodium 134 (L) 135 - 145 mmol/L   Potassium 3.6 3.5 - 5.1 mmol/L   Chloride 92 (L) 101 - 111 mmol/L   CO2 31 22 - 32 mmol/L   Glucose, Bld 190 (H) 65 - 99 mg/dL   BUN 33 (H) 6 - 20 mg/dL   Creatinine, Ser 1.54 (H) 0.44 - 1.00 mg/dL   Calcium 8.4 (L) 8.9 - 10.3 mg/dL   GFR calc non Af Amer 33 (L) >60 mL/min   GFR calc Af Amer 39 (L) >60 mL/min    Comment: (NOTE) The eGFR has been calculated using the CKD EPI equation. This calculation has not been validated in all clinical situations. eGFR's persistently <60 mL/min signify possible Chronic Kidney Disease.    Anion gap 11 5 - 15  Glucose, capillary     Status: Abnormal   Collection Time: 11/18/15  6:04 AM  Result Value Ref Range   Glucose-Capillary 146 (H) 65 - 99 mg/dL    No results found.  Review of Systems  Constitutional: Negative for fever and diaphoresis.  HENT: Negative for congestion and sore throat.   Respiratory: Negative for cough, shortness of breath and wheezing.   Cardiovascular: Positive for leg swelling. Negative for chest pain, orthopnea and PND.  Gastrointestinal: Negative for nausea, vomiting, abdominal pain, blood in stool and melena.  Genitourinary: Negative for dysuria.  Musculoskeletal: Negative for myalgias.  Neurological: Negative for  dizziness and weakness.  All other systems reviewed and are negative.  Blood pressure 129/68, pulse 67, temperature 98.2 F (36.8 C), temperature source Oral, resp. rate 18, height '5\' 2"'  (5.537 m), weight 182 lb 15.7 oz (83 kg), SpO2 97 %. Physical Exam  Nursing note and vitals reviewed. Constitutional: She is oriented to person, place, and time. She appears well-developed. No distress.  Obese  HENT:  Head: Normocephalic and atraumatic.  Eyes: EOM are normal. Pupils are equal, round, and  reactive to light. No scleral icterus.  Neck: Normal range of motion. Neck supple.  Cardiovascular: Normal rate, regular rhythm, S1 normal and S2 normal.   No murmur heard. Pulses:      Radial pulses are 1+ on the right side, and 2+ on the left side.       Dorsalis pedis pulses are 2+ on the right side, and 2+ on the left side.  Respiratory: Effort normal. She has no wheezes. She has rales.  GI: Soft. Bowel sounds are normal. She exhibits no distension. There is no tenderness.  Musculoskeletal: She exhibits edema (1+ left greater than right lower extremity edema).  Lymphadenopathy:    She has no cervical adenopathy.  Neurological: She is alert and oriented to person, place, and time. She exhibits normal muscle tone.  Skin: Skin is warm and dry.  Psychiatric: She has a normal mood and affect.     70 y.o. female with a hx of CAD status post CABG, ischemic cardiomyopathy, chronic systolic CHF, COPD, diabetes, PAD, HL, CKD stage III, HTN. 2-D echocardiogram 07/2015 revealed an ejection fraction of 35-40% with akinesis of the basal inferior myocardium(reported as hypokinetic in March 2016 with EF 50%). She has grade 1 diastolic dysfunction.    Acute on chronic combined systolic and diastolic CHF (congestive heart failure) ([place]) She has responded quite well to addition of metolazone 2.5 mg yesterday. Net fluids: -4.5L/-6.3L.  she reports being up all night urinating. She is breathing much better and her  edema has improved. Her weight when she saw Richardson Dopp, PA-C, in our office in November was 176.  Patient says she was 177 this morning(the weight is not charted).  She has had an increase in her creatinine since yesterday from 1.35 to 1.54.  She may be close to being euvolemic. It is hard to tell she has any JVD. I would continue to watch her until tomorrow on current dose of torsemide.  We should be able to see her within a week after discharge in the office. We discussed daily weight monitoring, when to call the office and low-sodium diet, which it sounds as though she tries to adhere to.  She is on appropriate medications including Coreg 12.5 mg twice daily, hydralazine 25 mg every 8 hours Imdur 15 mg.    CAD (coronary artery disease)  No complaints of angina.  Currently on aspirin, Plavix, Imdur, Zocor 40 mg   Diabetes mellitus type 2, uncontrolled ([place])   Hypertension  Blood pressures controlled.   Hyperlipidemia  Zocor   Elevated troponin  0.093 at admission. This is likely demand ischemia from heart failure.   Acute on chronic respiratory failure with hypoxia (H[facility])   COPD, severe (HCC)   Status post tracheostomy ([place])   Chronic kidney disease, stage 3   AV block, 1st degree   Diarrhea  HAGER, BRYAN, PAC 11/18/2015, 11:52 AM   The patient was seen, examined and discussed with Tarri Fuller, PA-C and I agree with the above.   70 y.o. female with a hx of CAD status post CABG, ischemic cardiomyopathy, chronic systolic CHF, COPD, diabetes, PAD, HL, CKD stage III, HTN. 2-D echocardiogram 07/2015 revealed an ejection fraction of 35-40% with akinesis of the basal inferior myocardium(reported as hypokinetic in March 2016 with EF 50%) who was admitted on 11/10/15 with acute on chronic combined systolic and diastolic CHF. She was non-responsive to Lasix, her diuretics were switched to torsemide 40 mg po BID and metolazone x 1 with diuresis of 5 lbs  overnight, now still mildly overloaded with  worsening kidney failure. I would hold off metolazone today, recheck Crea in the am. Observe overnight, if stable discharge tomorrow on torsemide 40 mg po BID and metolazone 2.5 mg po daily PRN. She was educated. Her BP is controlled. Minimal troponin elevation sec to CKD and CHF. She is chest pain free, no ischemic workup.  Olivine Spark 11/18/2015

## 2015-11-18 NOTE — Progress Notes (Signed)
Visit to patients room to give tx and do trach check.  Pt notified me that her trach had not been cleaned today and was stopped up .  I got trach care kit removed pts non disposable inner can and cleaned throughly.  Suctioned pt and gave neb pt feels much better now.  Advised RN I had cleaned.

## 2015-11-19 DIAGNOSIS — I440 Atrioventricular block, first degree: Secondary | ICD-10-CM

## 2015-11-19 LAB — GLUCOSE, CAPILLARY
GLUCOSE-CAPILLARY: 124 mg/dL — AB (ref 65–99)
GLUCOSE-CAPILLARY: 122 mg/dL — AB (ref 65–99)

## 2015-11-19 MED ORDER — TORSEMIDE 20 MG PO TABS: 40.0000 mg | ORAL_TABLET | Freq: Two times a day (BID) | ORAL | Status: DC

## 2015-11-19 MED ORDER — CARVEDILOL 12.5 MG PO TABS: 12.5000 mg | ORAL_TABLET | Freq: Two times a day (BID) | ORAL | Status: DC

## 2015-11-19 NOTE — Discharge Summary (Signed)
Physician Discharge Summary  [name] UEA:[id number] DOB: 11/27/1945 DOA: 11/10/2015  PCP: Cala Bradford, MD  Admit date: 11/10/2015 Discharge date: 11/19/2015  Time spent: 40 minutes  Recommendations for Outpatient Follow-up:  1. Follow-up with primary care physician within one week. 2. Coreg dose decreased to 12.5 because of bradycardia. 3. Lasix discontinued, started on torsemide 40 mg twice a day. 4. Please obtain BMP in 1 week.   Discharge Diagnoses:  Principal Problem:   Acute on chronic combined systolic and diastolic CHF (congestive heart failure) (HCC) Active Problems:   CAD (coronary artery disease)   Diabetes mellitus type 2, uncontrolled (HCC)   Hypertension   Hyperlipidemia   Elevated troponin   Acute on chronic respiratory failure with hypoxia (HCC)   COPD, severe (HCC)   Status post tracheostomy (HCC)   Chronic kidney disease, stage 3   AV block, 1st degree   Diarrhea   Discharge Condition: Stable  Diet recommendation: Heart healthy  Filed Weights   11/16/15 0605 11/17/15 0532 11/19/15 0611  Weight: 82.918 kg (182 lb 12.8 oz) 83 kg (182 lb 15.7 oz) 80.423 kg (177 lb 4.8 oz)    History of present illness:  [name] is a 70 y.o. female  With a history of heart failure, diabetes, coronary artery disease, tracheostomy, that presented to the emergency department with complaints of shortness of breath. Over the last week, patient has been more short of breath and having abdominal distention as well as lower extremity swelling. She states she has gained a few pounds in the last week as well. She has been taking her Lasix. Patient is also complaining of dry cough however no fevers. She denies a sick contacts or recent travel. Patient states it makes her shortness of breath better or worse. She has not noticed any bloody stools. Patient does state she's had diarrhea for more than a year. In the emergency department, patient was noted to have an  elevated BNP as well as a hemoglobin of 8.8. She was found to have a positive fecal occult blood test. Patient was found to be in respiratory distress with oxygenation levels in the 80s. She was placed on supplemental oxygen. [facility] called for admission.  Hospital Course:    Acute hypoxic respiratory failure secondary to Acute on chronic combined systolic and diastolic CHF -Presented with SOB, abdominal distention and lower extremity edema. -Patient was placed on IV Lasix, subsequently transitioned to torsemide and Zaroxolyn.  -Weight is down from 184-177 on discharge, good results with diuresis. -Continue aspirin, Plavix, Coreg, Imdur, hydralazine and statin. -Cardiology recommended to discharge on torsemide 40 mg twice a day.  Anemia of chronic disease. -H&H remained stable. -Most likely hemorrhoidal bleeding. -Continue iron supplementation  Mild acute on chronic kidney disease stage III -Patient now placed on torsemide, creatinine stable at 1.5 yesterday.  Tracheostomy status. -Continue trach collar as needed.  Coronary artery disease, chronic troponin. Continue aspirin, Plavix, beta blocker, statin.  Type 2 diabetes mellitus. Last hemoglobin A1c 9.31 July 2015. Uncontrolled diabetes mellitus type 2, restarted all medications on discharge.  Diarrhea. Resolved at present  COPD. Continue nebulizers to the regimen, RT for trach, continue flutter.  PVCs with bradycardia - Coreg was decreased to 12.5 mg BID  Procedures:  None  Consultations:  Cardiology  Discharge Exam: Filed Vitals:   11/19/15 0756 11/19/15 1135  BP:    Pulse: 62 68  Temp:    Resp: 18 18   General: Alert and awake, oriented x3, not in  any acute distress. HEENT: anicteric sclera, pupils reactive to light and accommodation, EOMI CVS: S1-S2 clear, no murmur rubs or gallops Chest: clear to auscultation bilaterally, no wheezing, rales or rhonchi Abdomen: soft nontender, nondistended, normal bowel  sounds, no organomegaly Extremities: no cyanosis, clubbing or edema noted bilaterally Neuro: Cranial nerves II-XII intact, no focal neurological deficits   Discharge Instructions   Discharge Instructions    AMB Referral to [facility] Care Management    Complete by:  As directed   Reason for consult:  Post hospital follow up  Diagnoses of:  Heart Failure  Expected date of contact:  1-3 days (reserved for hospital discharges)  Please assign to community nurse for transition of care calls and assess for home visits. Patient was with Health Coach prior to admission. Questions please call:  Charlesetta Shanks, RN BSN CCM [employer]  [phone] business mobile phone Toll free office [phone]     Diet - low sodium heart healthy    Complete by:  As directed      Increase activity slowly    Complete by:  As directed           Current Discharge Medication List    START taking these medications   Details  torsemide (DEMADEX) 20 MG tablet Take 2 tablets (40 mg total) by mouth 2 (two) times daily. Qty: 120 tablet, Refills: 0      CONTINUE these medications which have CHANGED   Details  carvedilol (COREG) 12.5 MG tablet Take 1 tablet (12.5 mg total) by mouth 2 (two) times daily with a meal. Qty: 60 tablet, Refills: 0      CONTINUE these medications which have NOT CHANGED   Details  aspirin EC 81 MG tablet Take 81 mg by mouth daily.    clopidogrel (PLAVIX) 75 MG tablet Take 75 mg by mouth daily.    dicyclomine (BENTYL) 20 MG tablet Take 20 mg by mouth 3 (three) times daily before meals.    DULoxetine (CYMBALTA) 60 MG capsule Take 60 mg by mouth daily. Refills: 5    gabapentin (NEURONTIN) 300 MG capsule Take 300 mg by mouth at bedtime.    guaiFENesin (ROBITUSSIN) 100 MG/5ML SOLN Take 5 mLs (100 mg total) by mouth every 4 (four) hours as needed for cough or to loosen phlegm. Qty: 236 mL, Refills: 0    insulin aspart (NOVOLOG) 100 UNIT/ML injection Inject  12-16 Units into the skin 3 (three) times daily with meals.    Insulin Degludec (TRESIBA FLEXTOUCH) 100 UNIT/ML SOPN Inject 48 Units into the skin at bedtime.    ipratropium-albuterol (DUONEB) 0.5-2.5 (3) MG/3ML SOLN Take 3 mLs by nebulization 2 (two) times daily as needed (for shortnes of breath).     levothyroxine (SYNTHROID, LEVOTHROID) 50 MCG tablet Take 50 mcg by mouth daily before breakfast.  Refills: 6    Multiple Vitamins-Minerals (WOMENS 50+ MULTI VITAMIN/MIN) TABS Take 1 tablet by mouth daily.    Probiotic Product (PROBIOTIC PO) Take 1 tablet by mouth 2 (two) times daily.    simvastatin (ZOCOR) 40 MG tablet Take 40 mg by mouth every evening.     traMADol (ULTRAM) 50 MG tablet Take 50 mg by mouth every 8 (eight) hours as needed for moderate pain.  Refills: 0    ferrous sulfate 325 (65 FE) MG tablet Take 325 mg by mouth daily with breakfast.    glucose ([employer] GLUCOSE) 4 GM chewable tablet Chew 1 tablet by mouth as needed for low blood sugar.  hydrALAZINE (APRESOLINE) 25 MG tablet Take 25 mg by mouth as directed.    isosorbide mononitrate (IMDUR) 30 MG 24 hr tablet Take 15 mg by mouth daily.    lansoprazole (PREVACID) 15 MG capsule Take 15 mg by mouth 2 (two) times daily before a meal.    nitroGLYCERIN (NITROSTAT) 0.4 MG SL tablet Place 0.4 mg under the tongue every 5 (five) minutes as needed. For chest pain    NOVOLOG FLEXPEN 100 UNIT/ML FlexPen 70 -199 TAKE 12 UNITS, 200-299 TAKE 14 UNITS, 300 OR HIGHER 16 UNITS SUBCUTANEOUS Refills: 4    OXYGEN-HELIUM IN Inhale 4 L into the lungs at bedtime.       STOP taking these medications     furosemide (LASIX) 40 MG tablet        Allergies  Allergen Reactions  . Aldactone [Spironolactone] Other (See Comments)    Severe hyperkalemia   . Lisinopril Other (See Comments) and Cough    Hypotension also  . Crestor [Rosuvastatin Calcium] Other (See Comments)    Muscle Pain  . Vicodin [Hydrocodone-Acetaminophen] Nausea And  Vomiting      The results of significant diagnostics from this hospitalization (including imaging, microbiology, ancillary and laboratory) are listed below for reference.    Significant Diagnostic Studies: Dg Chest 2 View  11/15/2015  CLINICAL DATA:  Shortness of breath, congestive heart failure EXAM: CHEST  2 VIEW COMPARISON:  11/11/2015 FINDINGS: Tracheostomy tube is unchanged in position. Central vascular congestion again noted. Status post median sternotomy. There is mild interstitial prominence bilateral consistent with mild interstitial edema with slight improvement from prior exam. Persistent streaky atelectasis or scarring right base and lingula. IMPRESSION: There is mild interstitial prominence bilateral consistent with mild interstitial edema with slight improvement from prior exam. Persistent streaky atelectasis or scarring right base and lingula. Electronically Signed   By: Natasha Mead M.D.   On: 11/15/2015 15:00   Dg Chest 2 View  11/11/2015  CLINICAL DATA:  Shortness of breath for 2 days. Has tracheostomy tube. EXAM: CHEST  2 VIEW COMPARISON:  11/10/2015 FINDINGS: Tracheostomy tube remains in stable position. Prior median sternotomy. Cardiomegaly with bilateral interstitial and alveolar opacities most compatible with edema/CHF no change. No significant effusions. IMPRESSION: Moderate pulmonary edema/CHF. Electronically Signed   By: Charlett Nose M.D.   On: 11/11/2015 08:20   Dg Chest 2 View  11/10/2015  CLINICAL DATA:  Shortness of breath for 2 days.  Cough for 1 week EXAM: CHEST  2 VIEW COMPARISON:  May 28, 2015 FINDINGS: There is cardiomegaly with pulmonary venous hypertension. There is generalized interstitial edema, stable. There is atelectatic change in the superior lingula, stable. No new opacity. Tracheostomy catheter tip is 3.6 cm above the carina. No pneumothorax. There is marked collapse of the L1 vertebral body, stable. IMPRESSION: Findings indicative of chronic congestive  heart failure. Stable atelectatic change in the superior lingula. Overall no appreciable change compared to prior study. Electronically Signed   By: Bretta Bang III M.D.   On: 11/10/2015 14:17    Microbiology: Recent Results (from the past 240 hour(s))  MRSA PCR Screening     Status: None   Collection Time: 11/10/15  8:11 PM  Result Value Ref Range Status   MRSA by PCR NEGATIVE NEGATIVE Final    Comment:        The GeneXpert MRSA Assay (FDA approved for NASAL specimens only), is one component of a comprehensive MRSA colonization surveillance program. It is not intended to diagnose MRSA infection nor  to guide or monitor treatment for MRSA infections.      Labs: Basic Metabolic Panel:  Recent Labs Lab 11/13/15 0257 11/14/15 1544 11/16/15 0312 11/17/15 0756 11/18/15 0257  NA 145 145 142 144 134*  K 3.9 3.5 3.9 3.8 3.6  CL 106 104 103 103 92*  CO2 32 32 32 33* 31  GLUCOSE 177* 175* 214* 126* 190*  BUN 27* 22* 28* 28* 33*  CREATININE 1.50* 1.48* 1.44* 1.35* 1.54*  CALCIUM 8.5* 8.7* 8.4* 8.3* 8.4*  MG  --   --  2.2 2.1 2.2   Liver Function Tests:  Recent Labs Lab 11/16/15 0312  AST 20  ALT 24  ALKPHOS 77  BILITOT 0.5  PROT 6.1*  ALBUMIN 3.2*   No results for input(s): LIPASE, AMYLASE in the last 168 hours. No results for input(s): AMMONIA in the last 168 hours. CBC:  Recent Labs Lab 11/13/15 0257 11/14/15 1544 11/16/15 0312 11/17/15 0756 11/18/15 0257  WBC 5.4 4.1 4.7 4.0 5.0  NEUTROABS  --   --  2.6  --   --   HGB 8.9* 9.5* 9.6* 9.0* 9.8*  HCT 30.5* 31.3* 31.1* 29.2* 32.1*  MCV 99.7 97.8 97.2 97.7 96.4  PLT 181 189 169 150 164   Cardiac Enzymes: No results for input(s): CKTOTAL, CKMB, CKMBINDEX, TROPONINI in the last 168 hours. BNP: BNP (last 3 results)  Recent Labs  07/27/15 0744 11/10/15 1441 11/17/15 0750  BNP 852.0* 1053.3* 982.1*    ProBNP (last 3 results) No results for input(s): PROBNP in the last 8760  hours.  CBG:  Recent Labs Lab 11/18/15 1114 11/18/15 1641 11/18/15 2109 11/19/15 0607 11/19/15 1119  GLUCAP 219* 260* 323* 124* 122*       Signed:  Crystalynn Mcinerney A MD.  [facility] 11/19/2015, 12:02 PM

## 2015-11-19 NOTE — Progress Notes (Signed)
MD notified about QTc of 529 this am. Will continue to monitor.   Berdine Dance RN, BSN

## 2015-11-19 NOTE — Care Management Note (Signed)
Case Management Note  Patient Details  Name: [name] MRN: [id number] Date of Birth: May 09, 1946  Subjective/Objective:    Pt admitted with acute/chronic CHF                  Action/Plan: Action/Plan: Patient lives at home with daughter, use the [facility] for transportation services. PCP is Dr Laurann Montana; Dr Jenne Pane ENT. Has private insurance with Medicare / [name] - with prescription drug coverage - no problems getting her medication. [facility]. Patient has a nurse that checks on her monthly, home 02 through [facility]. DME - has a walker and cane. No needs identifie   Expected Discharge Date:                  Expected Discharge Plan:  Home/Self Care  In-House Referral:     Discharge planning Services  CM Consult  Post Acute Care Choice:  Resumption of Svcs/PTA Provider Choice offered to:  Patient  DME Arranged:  Oxygen DME Agency:  [facility]  HH Arranged:    HH Agency:     Status of Service:  In process, will continue to follow  Medicare Important Message Given:  Yes Date Medicare IM Given:    Medicare IM give by:    Date Additional Medicare IM Given:    Additional Medicare Important Message give by:     If discussed at Long Length of Stay Meetings, dates discussed:    Additional Comments: 11/19/2015 Pt will discharge home with oxygen supplied by [facility], liter flow at night has not changed since prior to admit and therefore resumption orders are not required.  CM assessed pt prior to discharge, no CM needs determined  Cherylann Parr, RN 11/19/2015, 1:51 PM

## 2015-11-19 NOTE — Progress Notes (Signed)
Pt has been discharged. Pt received discharge instructions and all questions were answered. Pt's IV and telemetry box were removed. Pt left the floor via wheelchair and was accompanied by a [place] volunteer. Pt was in no distress at time of discharge.  Jencarlo Bonadonna Moffitt RN, BSN 

## 2015-11-19 NOTE — Progress Notes (Signed)
Inpatient Diabetes Program Recommendations  AACE/ADA: New Consensus Statement on Inpatient Glycemic Control (2015)  Target Ranges:  Prepandial:   less than 140 mg/dL      Peak postprandial:   less than 180 mg/dL (1-2 hours)      Critically ill patients:  140 - 180 mg/dL   Review of Glycemic Control  Inpatient Diabetes Program Recommendations:    Fasting glucose is controlled, but glucose following breakfast, before lunch and supper and at HS are high. Patient documented as eating 100% Please consider adding novolog meal coverage of 3 units tidwc to start in addition to the sensitive correction tidwc  Thank you Lenor Coffin, RN, MSN, CDE  Diabetes Inpatient Program Office: [phone] Pager: [phone] 8:00 am to 5:00 pm

## 2015-11-19 NOTE — Progress Notes (Addendum)
   Cardiologist: Anne Fu  Subjective:  Feels better. No SOB. Mild left lateral lower leg pain. Right flank pain fleeting.   Objective:  Vital Signs in the last 24 hours: Temp:  [97.9 F (36.6 C)-98.3 F (36.8 C)] 98.3 F (36.8 C) (01/26 0520) Pulse Rate:  [60-74] 62 (01/26 0756) Resp:  [18-19] 18 (01/26 0756) BP: (104-159)/(47-59) 126/47 mmHg (01/26 0520) SpO2:  [96 %-99 %] 96 % (01/26 0756) FiO2 (%):  [28 %] 28 % (01/26 0520) Weight:  [177 lb 4.8 oz (80.423 kg)] 177 lb 4.8 oz (80.423 kg) (01/26 8657)  Intake/Output from previous day: 01/25 0701 - 01/26 0700 In: 240 [P.O.:240] Out: -    Physical Exam: General: Well developed, well nourished, in no acute distress. Head:  Normocephalic and atraumatic.Trach Lungs: Clear to auscultation and percussion.  Heart: Normal S1 and S2.  No murmur, rubs or gallops.  Abdomen: soft, non-tender, positive bowel sounds. Obese Extremities: No clubbing or cyanosis. No edema. Neurologic: Alert and oriented x 3.    Lab Results:  Recent Labs  11/17/15 0756 11/18/15 0257  WBC 4.0 5.0  HGB 9.0* 9.8*  PLT 150 164    Recent Labs  11/17/15 0756 11/18/15 0257  NA 144 134*  K 3.8 3.6  CL 103 92*  CO2 33* 31  GLUCOSE 126* 190*  BUN 28* 33*  CREATININE 1.35* 1.54*     Telemetry: QTc on ECG 490's, No VT Personally viewed.   EKG:  NSR as above Personally viewed.  Cardiac Studies:  ECHO EF 35-40%  Meds: Scheduled Meds: . antiseptic oral rinse  7 mL Mouth Rinse BID  . aspirin EC  81 mg Oral Daily  . carvedilol  12.5 mg Oral BID WC  . clopidogrel  75 mg Oral Daily  . dicyclomine  20 mg Oral TID AC  . DULoxetine  60 mg Oral Daily  . enoxaparin (LOVENOX) injection  40 mg Subcutaneous Q24H  . ferrous sulfate  325 mg Oral Daily  . gabapentin  300 mg Oral QHS  . guaiFENesin  600 mg Oral BID  . hydrALAZINE  25 mg Oral 3 times per day  . insulin aspart  0-9 Units Subcutaneous TID WC  . insulin glargine  18 Units Subcutaneous QHS    . ipratropium-albuterol  3 mL Nebulization TID  . isosorbide mononitrate  15 mg Oral Daily  . levothyroxine  50 mcg Oral QAC breakfast  . multivitamin with minerals  1 tablet Oral Daily  . saccharomyces boulardii  250 mg Oral BID  . simvastatin  40 mg Oral QPM  . torsemide  40 mg Oral BID   Continuous Infusions:  PRN Meds:.acetaminophen **OR** acetaminophen, nitroGLYCERIN, ondansetron **OR** ondansetron (ZOFRAN) IV, phenol-menthol, senna-docusate  Assessment/Plan:  Principal Problem:   Acute on chronic combined systolic and diastolic CHF (congestive heart failure) (HCC) Active Problems:   CAD (coronary artery disease)   Diabetes mellitus type 2, uncontrolled (HCC)   Hypertension   Hyperlipidemia   Elevated troponin   Acute on chronic respiratory failure with hypoxia (HCC)   COPD, severe (HCC)   Status post tracheostomy (HCC)   Chronic kidney disease, stage 3   AV block, 1st degree   Diarrhea   -Continue with torsemide 40 BID -Education-fluid restrictions -Consider repeat BMET  OK with DC.  Will get follow up in 1-2 weeks.  [name] 11/19/2015, 10:21 AM

## 2015-11-19 NOTE — Progress Notes (Signed)
Pt. Was seen for trach consult, all equipment at bedside, sat good on room air 99%. Pt. Stated she was going home.

## 2015-11-20 ENCOUNTER — Other Ambulatory Visit: Payer: Self-pay | Admitting: *Deleted

## 2015-11-20 NOTE — Patient Outreach (Signed)
Referral received from hospital liaison to restart community care management for member due to recent hospitalization.  She was discharged on yesterday after being admitted last week with congestive heart failure.  She was previously involved with community care management, and most recently with the health coach.   Call placed to member to initiate transition of care program.  She states that she is feeling better today, and her weight is down 3 pounds from discharge.  She reports that she will continue to weigh herself daily, recording and contacting her physician when needed.  She states that since she was just discharged yesterday afternoon, she has not had the chance to pick up her new fluid pill, but plan on doing that today.  Advised on the importance of obtaining the med as soon as possible.    She states that she has not scheduled her follow up appointments, but will do so today.  This care manager requests to schedule a home visit within the next couple weeks, but the member states that she is preparing to visit her other children in [place] for a couple weeks.  She agrees to schedule appointments prior to going out of town and agrees to continue weekly transition of care calls.  She denies any urgent needs at time.  Encouraged to contact this care manager with any concerns or questions.  Kemper Durie, BSN, [facility] Care Management  [facility] Care Manager [phone]

## 2015-11-27 ENCOUNTER — Telehealth: Payer: Self-pay | Admitting: Cardiology

## 2015-11-27 ENCOUNTER — Other Ambulatory Visit: Payer: Self-pay | Admitting: *Deleted

## 2015-11-27 NOTE — Telephone Encounter (Signed)
New message  Tammy called. Faxed lab results per discharge instructions. Requests a call back once it is received if you would like to go over them.

## 2015-11-27 NOTE — Patient Outreach (Signed)
Weekly transition of care call placed to member.  She states that she is doing better, dropping her weight down to 170 pounds today.  Member reports taking all of her medications, including the new prescription of Torsemide, without problems.  She has not had her follow up appointments since discharge, but she state that she has talked to her PCP & Cardiology office to schedule the appointments.  She reports that she had a hypoglycemic episode yesterday that did not require her to go to the emergency department for evaluation, but EMS was called to the home.  She reports her blood sugar being in the 20s, and states that she was busy trying to pack for her trip that she forgot to eat.  Advised of the complications of hypoglycemia and instructed to make sure that she eat a healthy diet, 3 meals a day and healthy snacks in between if needed.  Member verbalizes understanding, stating that this is the first episode in a while.  She state that she will be leaving tomorrow to spend two weeks with her children in [place].  She is reminded to take all of her medicine and monitoring equipment that she will need on a daily basis.  She reports that she already have everything together and that the daughter she resides with has updated her other children on her condition and what to expect.  Member made aware that this care manager will continue weekly calls to her cell phone for transition of care while she is out of town.  She verbalizes understanding.

## 2015-11-27 NOTE — Telephone Encounter (Signed)
Have not received lab results as of yet.

## 2015-11-30 NOTE — Telephone Encounter (Signed)
Still no results of labwork

## 2015-12-02 NOTE — Telephone Encounter (Signed)
Left message for Tammy that we never received the results of pt's bloodwork.   Tammy called back and stated she also had it faxed to pt's PCP.  The PCP changed her Torsemide to 40 mg once a day (from 40 mg BID) d/t elevated BUN/Crea.  PCP requested to continue daily weights and repeat blood work in 1 week. Tammy will refax lab results to [phone].

## 2015-12-03 ENCOUNTER — Telehealth: Payer: Self-pay | Admitting: Cardiology

## 2015-12-03 NOTE — Telephone Encounter (Signed)
New message      Calling to see if you received the faxed lab results.  Pt has an appt with [place] tomorrow.  She has orders from the PCP to draw labs.  Will we drawing labs also?

## 2015-12-03 NOTE — Telephone Encounter (Signed)
Have not received any lab results on this pt as of yet.  Dr Anne Fu will determine if she needs further labwork once he sees her tomorrow.

## 2015-12-03 NOTE — Telephone Encounter (Signed)
I s/w Avie Arenas, RN today and informed her the lab results she was looking for I found in [place] box today. I reviewed these labs with Avie Arenas, RN who stated the pt is seeing Dr. Anne Fu tomorrow 12/04/15. I will turn the lab results over to Avie Arenas, RN and Dr. Anne Fu for appt tomorrow. Pt was changed from Lasix to Torsemide 40 mg BID, which PCP then decreased Torsemide to 40 mg daily due to BUN.

## 2015-12-04 ENCOUNTER — Encounter: Payer: Self-pay | Admitting: Cardiology

## 2015-12-04 ENCOUNTER — Ambulatory Visit (INDEPENDENT_AMBULATORY_CARE_PROVIDER_SITE_OTHER): Payer: Medicare Other | Admitting: Cardiology

## 2015-12-04 ENCOUNTER — Other Ambulatory Visit: Payer: Self-pay | Admitting: *Deleted

## 2015-12-04 VITALS — BP 140/60 | HR 84 | Ht 62.0 in | Wt 178.2 lb

## 2015-12-04 DIAGNOSIS — I5022 Chronic systolic (congestive) heart failure: Secondary | ICD-10-CM

## 2015-12-04 DIAGNOSIS — Z951 Presence of aortocoronary bypass graft: Secondary | ICD-10-CM

## 2015-12-04 DIAGNOSIS — N183 Chronic kidney disease, stage 3 unspecified: Secondary | ICD-10-CM

## 2015-12-04 DIAGNOSIS — R0602 Shortness of breath: Secondary | ICD-10-CM

## 2015-12-04 DIAGNOSIS — Z930 Tracheostomy status: Secondary | ICD-10-CM | POA: Diagnosis not present

## 2015-12-04 LAB — BASIC METABOLIC PANEL
BUN: 51 mg/dL — ABNORMAL HIGH (ref 7–25)
CALCIUM: 9.3 mg/dL (ref 8.6–10.4)
CO2: 30 mmol/L (ref 20–31)
Chloride: 106 mmol/L (ref 98–110)
Creat: 1.72 mg/dL — ABNORMAL HIGH (ref 0.50–0.99)
Glucose, Bld: 141 mg/dL — ABNORMAL HIGH (ref 65–99)
Potassium: 4.6 mmol/L (ref 3.5–5.3)
SODIUM: 146 mmol/L (ref 135–146)

## 2015-12-04 NOTE — Patient Outreach (Signed)
Weekly transition of care call placed to member.  She state "I am doing real good."  She reports that her weight has been consistent since discharge, denies any shortness of breath or swelling.  She states that her Torsemide was decreased to daily instead of twice a day, reports taking all medications without problems.    Member was supposed to be in [place] for two weeks with her son and daughter, but did not go (reason not related to her health status).  She states that she has a follow up appointment today with her cardiologist.  She denies concerns at this time.  Will continue with transition of care calls next week.  Home visit scheduled for 2/28.  Encouraged to contact this care manager with questions.  Kemper Durie, BSN, [facility] Care Management  [facility] Care Manager [phone]

## 2015-12-04 NOTE — Patient Instructions (Signed)
Medication Instructions:  The current medical regimen is effective;  continue present plan and medications.  Labwork: Please have blood work today (BMP)  Follow-Up: Follow up in 2 months with Dr Anne Fu.  If you need a refill on your cardiac medications before your next appointment, please call your pharmacy.  Thank you for choosing  [facility]!!

## 2015-12-04 NOTE — Progress Notes (Signed)
[address] [place], [place]  [address] Phone: [phone] Fax:  [phone]  Date:  12/04/2015   ID:  [name], DOB 09-03-1946, MRN [id number]  PCP:  Cala Bradford, MD   History of Present Illness: [name] is a 70 y.o. female  discharged on 01/04/13 with COPD, chronic systolic congestive heart failure with EF of 20-25%, most recent 40%. She has tracheostomy, placed by Dr. Jenne Pane. She has been on chronic oxygen therapy secondary to COPD as well as systolic heart failure.  She was hospitalized in late January 2017 with shortness of breath. Torsemide was utilized 40 mg twice a day then decrease to 40 mg once a day secondary to increased BUN. She still having nagging cough. No specific sputum production.   Abdominal distention. Lower extremity edema improved. Weight was 177 pounds on discharge. It is now 178.  Left LE bypass, Dr. Darrick Penna. Plavix. Hemoglobin A1c 9.7 uncontrolled.     Wt Readings from Last 3 Encounters:  12/04/15 178 lb 3.2 oz (80.831 kg)  11/19/15 177 lb 4.8 oz (80.423 kg)  11/04/15 184 lb (83.462 kg)     Past Medical History  Diagnosis Date  . Diabetes mellitus     on Lantus 30 U   . CAD (coronary artery disease)     a. S/p CABG and stenting b. L&R cath 01/09/15 LM patent, LAD 100% occluded, LCX moderate disease, RCA 100% occluded. SVG-RCA CTO, SVG-mid LAD patent with more distal LAD 70% stenosis. Recs for medical management.   Marland Kitchen PAD (peripheral artery disease) (HCC)   . Stroke [facility])     MRI 11/2011 with remote occipital lobe. MRA with moderate left focal vertebral artery stenosis  . Chronic combined systolic and diastolic CHF (congestive heart failure) (HCC)     a. 11/2011 Echo with EF 30-35%, global hypokinesis, and inferior akinesis;  b. 12/2014 Echo: EF 50%, mod LVH, Ao sclerosis w/o stenosis, mildly dil LA, mild to mod RV dysfxn.  . Hyperlipidemia   . DDD (degenerative disc disease), lumbar   . MI (myocardial infarction)  (HCC) 1997  . COPD (chronic obstructive pulmonary disease) (HCC)     a. prn and HS supplemental O2.  . Anemia   . Hypertension   . GERD (gastroesophageal reflux disease)   . History of IBS   . Hypothyroidism     Goiter  . Carotid artery occlusion   . CKD (chronic kidney disease), stage III     stage 3  . History of tracheostomy (HCC) 08/26/2013    Dr. Jenne Pane  . Shortness of breath dyspnea     Past Surgical History  Procedure Laterality Date  . Ptca    . Thyroidectomy    . Coronary artery bypass graft      2 vessel  . Carotid endarterectomy  ~2008    Left   . Cholecystectomy    . Tracheostomy tube placement  01/02/2012  . Angioplasty  [id number]    Aortogram by Dr. [place] [facility])  . Pr vein bypass graft,aorto-fem-pop      Right common femoral-AK popliteal BPG & Right Popliteal-posterior tibial  . Pr vein bypass graft,aorto-fem-pop      Left Fem-pop BPG  . Carpal tunnel release Right   . Femoral-tibial bypass graft Left 06/11/2013    Procedure: BYPASS GRAFT  LEFT FEMORAL- POSTERIOR TIBIAL ARTERY/ REDO;  Surgeon: Sherren Kerns, MD;  Location: [facility] OR;  Service: Vascular;  Laterality: Left;  .  Thrombectomy femoral artery Left 06/11/2013    Procedure: THROMBECTOMY FEMORAL ARTERY;  Surgeon: Sherren Kerns, MD;  Location: [facility] OR;  Service: Vascular;  Laterality: Left;  . Spine surgery  Oct. 27, 2014    Injection - Back  . Abdominal aortagram N/A 04/05/2013    Procedure: ABDOMINAL Ronny Flurry;  Surgeon: Sherren Kerns, MD;  Location: [facility] CATH LAB;  Service: Cardiovascular;  Laterality: N/A;  . Colonoscopy N/A 10/27/2014    Procedure: COLONOSCOPY;  Surgeon: Willis Modena, MD;  Location: [facility] ENDOSCOPY;  Service: Endoscopy;  Laterality: N/A;  . Left and right heart catheterization with coronary angiogram N/A 01/09/2015    Procedure: LEFT AND RIGHT HEART CATHETERIZATION WITH CORONARY ANGIOGRAM;  Surgeon: Marykay Lex, MD;  Location: [facility] CATH LAB;  Service: Cardiovascular;   Laterality: N/A;    Current Outpatient Prescriptions  Medication Sig Dispense Refill  . aspirin EC 81 MG tablet Take 81 mg by mouth daily.    . carvedilol (COREG) 12.5 MG tablet Take 1 tablet (12.5 mg total) by mouth 2 (two) times daily with a meal. 60 tablet 0  . clopidogrel (PLAVIX) 75 MG tablet Take 75 mg by mouth daily.    Marland Kitchen dicyclomine (BENTYL) 20 MG tablet Take 20 mg by mouth 3 (three) times daily before meals.    . DULoxetine (CYMBALTA) 60 MG capsule Take 60 mg by mouth daily.  5  . ferrous sulfate 325 (65 FE) MG tablet Take 325 mg by mouth daily with breakfast.    . gabapentin (NEURONTIN) 300 MG capsule Take 300 mg by mouth at bedtime.    Marland Kitchen glucose ([employer] GLUCOSE) 4 GM chewable tablet Chew 1 tablet by mouth as needed for low blood sugar.    Marland Kitchen guaiFENesin (ROBITUSSIN) 100 MG/5ML SOLN Take 5 mLs (100 mg total) by mouth every 4 (four) hours as needed for cough or to loosen phlegm. 236 mL 0  . hydrALAZINE (APRESOLINE) 25 MG tablet Take 25 mg by mouth as directed.    . insulin aspart (NOVOLOG) 100 UNIT/ML injection Inject 12-16 Units into the skin 3 (three) times daily with meals. Per sliding scale. 14 units taken last 11/09/15    . Insulin Degludec (TRESIBA FLEXTOUCH) 100 UNIT/ML SOPN Inject 48 Units into the skin at bedtime.    Marland Kitchen ipratropium-albuterol (DUONEB) 0.5-2.5 (3) MG/3ML SOLN Take 3 mLs by nebulization 2 (two) times daily as needed (for shortnes of breath).     . isosorbide mononitrate (IMDUR) 30 MG 24 hr tablet Take 15 mg by mouth daily.    . lansoprazole (PREVACID) 15 MG capsule Take 15 mg by mouth 2 (two) times daily before a meal.    . levothyroxine (SYNTHROID, LEVOTHROID) 50 MCG tablet Take 50 mcg by mouth daily before breakfast.   6  . Multiple Vitamins-Minerals (WOMENS 50+ MULTI VITAMIN/MIN) TABS Take 1 tablet by mouth daily.    . nitroGLYCERIN (NITROSTAT) 0.4 MG SL tablet Place 0.4 mg under the tongue every 5 (five) minutes as needed for chest pain (x 3 doses). For chest pain     . NOVOLOG FLEXPEN 100 UNIT/ML FlexPen 70 -199 TAKE 12 UNITS, 200-299 TAKE 14 UNITS, 300 OR HIGHER 16 UNITS SUBCUTANEOUS  4  . OXYGEN-HELIUM IN Inhale 4 L into the lungs at bedtime.     . Probiotic Product (PROBIOTIC PO) Take 1 tablet by mouth 2 (two) times daily.    . simvastatin (ZOCOR) 40 MG tablet Take 40 mg by mouth every evening.     . torsemide (  DEMADEX) 20 MG tablet Take 2 tablets (40 mg total) by mouth 2 (two) times daily. 120 tablet 0  . traMADol (ULTRAM) 50 MG tablet Take 50 mg by mouth every 8 (eight) hours as needed for moderate pain (body  pain). Reported on 11/20/2015  0  . [DISCONTINUED] dexlansoprazole (DEXILANT) 60 MG capsule Take 60 mg by mouth daily.     No current facility-administered medications for this visit.    Allergies:    Allergies  Allergen Reactions  . Aldactone [Spironolactone] Other (See Comments)    Severe hyperkalemia   . Lisinopril Other (See Comments) and Cough    Hypotension also  . Crestor [Rosuvastatin Calcium] Other (See Comments)    Muscle Pain  . Vicodin [Hydrocodone-Acetaminophen] Nausea And Vomiting    Social History:  The patient  reports that she quit smoking about 3 years ago. Her smoking use included Cigarettes. She has a 50 pack-year smoking history. She has never used smokeless tobacco. She reports that she does not drink alcohol or use illicit drugs.   ROS:  Please see the history of present illness.   Denies any lower extremity pain, recent left lower extremity bypass, no fevers, no chills, no cough, no syncope, no significant orthopnea. Occasional shortness of breath.    PHYSICAL EXAM: VS:  BP 140/60 mmHg  Pulse 84  Ht 5\' 2"  (1.575 m)  Wt 178 lb 3.2 oz (80.831 kg)  BMI 32.58 kg/m2 Well nourished, well developed, in no acute distress HEENT: Tracheostomy noted midline. Neck: no JVD Cardiac:  normal S1, S2; RRR; no murmur Lungs:  clear to auscultation bilaterally, no wheezing, rhonchi or rales Abd: soft, nontender, no  hepatomegaly Ext: no edema, stockings,  left dimishished pulsed,  Skin: warm and dry Neuro: no focal abnormalities noted  Labs: 09/11/13-Sodium 139, potassium 4.4, creatinine 1.2 ECG: 07/17/14-sinus rhythm, first degree AV block, 240 ms PR interval, poor R wave progression, nonspecific ST-T wave changes, QTC 498  ASSESSMENT AND PLAN:  1. Chronic systolic heart failure-EF 25%. Her current EF is 35-40%. Still with nagging cough since hospitalization. Possible viral upper respiratory component as well. Torsemide was originally 40 mg twice a day however BUN increased. She is now on 40 mg once a day. Continue. We will check basic metabolic profile.. 2. CAD-status post bypass surgery. Overall doing well. No anginal symptoms. 3. Old myocardial infarction-1997. 4. Status post tracheostomy-Dr. Jenne Pane. Subglottis stenosis with chronic tracheostomy 5. Chronic anemia-mild. 6. No oxygen at home. 7. Hypertension-currently well controlled. 8. Peripheral vascular disease-as described above. Recent left lower extremity bypass. Dr Darrick Penna 9. COPD-could also be playing a role in her shortness of breath. On home O2.  10. 4 month follow up, with Tereso Newcomer  Signed, Donato Schultz, MD [facility]  12/04/2015 1:59 PM

## 2015-12-10 ENCOUNTER — Telehealth: Payer: Self-pay | Admitting: Cardiology

## 2015-12-10 DIAGNOSIS — I10 Essential (primary) hypertension: Secondary | ICD-10-CM

## 2015-12-10 DIAGNOSIS — N183 Chronic kidney disease, stage 3 unspecified: Secondary | ICD-10-CM

## 2015-12-10 MED ORDER — TORSEMIDE 20 MG PO TABS: 20.0000 mg | ORAL_TABLET | Freq: Once | ORAL | Status: DC

## 2015-12-10 NOTE — Telephone Encounter (Signed)
New message ° ° ° ° °Returning a nurses call to get lab results °

## 2015-12-10 NOTE — Telephone Encounter (Signed)
Pt aware of results and instructions.  She will repeat lab 12/18/2015.

## 2015-12-11 ENCOUNTER — Other Ambulatory Visit: Payer: Self-pay | Admitting: *Deleted

## 2015-12-11 ENCOUNTER — Ambulatory Visit (INDEPENDENT_AMBULATORY_CARE_PROVIDER_SITE_OTHER)
Admission: RE | Admit: 2015-12-11 | Discharge: 2015-12-11 | Disposition: A | Discharge status: Home / Self Care | Payer: Medicare Other | Source: Ambulatory Visit | Attending: Internal Medicine | Admitting: Internal Medicine

## 2015-12-11 DIAGNOSIS — Z87891 Personal history of nicotine dependence: Secondary | ICD-10-CM | POA: Diagnosis not present

## 2015-12-11 DIAGNOSIS — Z122 Encounter for screening for malignant neoplasm of respiratory organs: Secondary | ICD-10-CM

## 2015-12-11 NOTE — Patient Outreach (Signed)
Weekly transition of care call placed to member, she states that she is "doing good, doing real good."  She reports that her weight has remained steady since decreasing her torsemide dose to once a day, weighing 172 pounds today, only one pound up from last week.  She denies shortness of breath or chest discomfort.  Member reports that her Evaristo Bury dose has been changed since her hypoglycemic episode last week.  She is not taking 30 units each night instead of 48 units and reports that her blood sugars have been "good."  She denies any hypoglycemic episodes.  She denies concerns at this time.  Will continue with weekly calls next week.  Kemper Durie, BSN, [facility] Care Management  [facility] Care Manager [phone]

## 2015-12-12 ENCOUNTER — Encounter: Payer: Self-pay | Admitting: *Deleted

## 2015-12-13 ENCOUNTER — Telehealth: Payer: Self-pay | Admitting: Internal Medicine

## 2015-12-13 NOTE — Telephone Encounter (Signed)
  pls give highlighted results    Ct Chest Lung Ca Screen Low Dose W/o Cm  12/11/2015  CLINICAL DATA:  70 year old female with 50 pack-year history of smoking. Lung cancer screening. EXAM: CT CHEST WITHOUT CONTRAST LOW-DOSE FOR LUNG CANCER SCREENING TECHNIQUE: Multidetector CT imaging of the chest was performed following the standard protocol without IV contrast. COMPARISON:  None. FINDINGS: Mediastinum/Nodes: There is no axillary lymphadenopathy. Tracheostomy tube noted. No mediastinal lymphadenopathy. Heart is enlarged. Patient is status post CABG. No evidence of pericardial effusion. The esophagus has normal imaging features. Lungs/Pleura: Centrilobular emphysema noted bilaterally. There is bronchial wall thickening in both lungs. Atelectasis or scarring in the lingula. No focal airspace consolidation or pulmonary edema. No pleural effusion. Scattered tiny bilateral pulmonary nodules are evident, the largest of which is in the central right upper lobe (image 122 series 3) with volume derived equivalent diameter of 4.5 mm. Upper abdomen: Unremarkable. Musculoskeletal: Bone windows reveal no worrisome lytic or sclerotic osseous lesions. IMPRESSION: 1. Lung-RADS Category 2, benign appearance or behavior. Continue annual screening with low-dose chest CT without contrast in 12 months. 2. Coronary artery atherosclerosis. 3. Emphysema. Electronically Signed   By: Kennith Center M.D.   On: 12/11/2015 11:13

## 2015-12-15 NOTE — Telephone Encounter (Signed)
Attempted to contact pt. No answer, no option to leave a message. Will try back.  

## 2015-12-16 NOTE — Telephone Encounter (Signed)
Called and spoke to pt. Informed her of the results and recs per MR. Pt verbalized understanding and denied any further questions or concerns at this time.   

## 2015-12-18 ENCOUNTER — Other Ambulatory Visit: Payer: Self-pay | Admitting: *Deleted

## 2015-12-18 ENCOUNTER — Other Ambulatory Visit (INDEPENDENT_AMBULATORY_CARE_PROVIDER_SITE_OTHER): Payer: Medicare Other | Admitting: *Deleted

## 2015-12-18 DIAGNOSIS — I10 Essential (primary) hypertension: Secondary | ICD-10-CM | POA: Diagnosis not present

## 2015-12-18 DIAGNOSIS — N183 Chronic kidney disease, stage 3 unspecified: Secondary | ICD-10-CM

## 2015-12-18 LAB — BASIC METABOLIC PANEL
BUN: 49 mg/dL — ABNORMAL HIGH (ref 7–25)
CALCIUM: 8.6 mg/dL (ref 8.6–10.4)
CO2: 28 mmol/L (ref 20–31)
CREATININE: 1.65 mg/dL — AB (ref 0.50–0.99)
Chloride: 103 mmol/L (ref 98–110)
Glucose, Bld: 157 mg/dL — ABNORMAL HIGH (ref 65–99)
Potassium: 4.3 mmol/L (ref 3.5–5.3)
Sodium: 142 mmol/L (ref 135–146)

## 2015-12-18 NOTE — Patient Outreach (Signed)
Weekly transition of care call placed to member.  She reports that she is doing "alright" stating that she has an appointment today with the cardiologist for some blood work.  She denies chest discomfort or shortness of breath, denies weight gain.  Home visit confirmed for Tuesday.  Denies questions at this time, encouraged to contact this care manager with concerns.  Kemper Durie, BSN, [facility] Care Management  [facility] Care Manager [phone]

## 2015-12-22 ENCOUNTER — Other Ambulatory Visit: Payer: Self-pay | Admitting: *Deleted

## 2015-12-22 VITALS — BP 118/68 | HR 64 | Resp 18 | Wt 172.0 lb

## 2015-12-22 DIAGNOSIS — I5041 Acute combined systolic (congestive) and diastolic (congestive) heart failure: Secondary | ICD-10-CM

## 2015-12-22 DIAGNOSIS — E1169 Type 2 diabetes mellitus with other specified complication: Secondary | ICD-10-CM

## 2015-12-22 NOTE — Patient Outreach (Signed)
[facility]) Care Management   12/22/2015  [name] 04/22/1946 [id number]  [name] is an 70 y.o. female  Subjective:   Member reports that she is "doing all right."  She denies any shortness of breath, chest discomfort, or fluid build up.  Objective:   Review of Systems  Constitutional: Negative.   HENT: Negative.   Eyes: Negative.   Respiratory: Positive for cough.   Cardiovascular: Negative.   Gastrointestinal: Negative.   Genitourinary: Negative.   Musculoskeletal: Positive for back pain.  Skin: Negative.   Neurological: Negative.   Endo/Heme/Allergies: Negative.   Psychiatric/Behavioral: Negative.     Physical Exam  Constitutional: She is oriented to person, place, and time. She appears well-developed and well-nourished.  Neck: Normal range of motion.  Cardiovascular: Normal rate, regular rhythm and normal heart sounds.   Respiratory: Effort normal and breath sounds normal.  GI: Soft. Bowel sounds are normal.  Musculoskeletal: Normal range of motion.  Neurological: She is alert and oriented to person, place, and time.  Skin: Skin is warm and dry.    BP 118/68 mmHg  Pulse 64  Resp 18  Wt 172 lb (78.019 kg)  SpO2 96%   Current Medications:   Current Outpatient Prescriptions  Medication Sig Dispense Refill  . aspirin EC 81 MG tablet Take 81 mg by mouth daily.    . carvedilol (COREG) 12.5 MG tablet Take 1 tablet (12.5 mg total) by mouth 2 (two) times daily with a meal. 60 tablet 0  . clopidogrel (PLAVIX) 75 MG tablet Take 75 mg by mouth daily.    Marland Kitchen dicyclomine (BENTYL) 20 MG tablet Take 20 mg by mouth 3 (three) times daily before meals.    . DULoxetine (CYMBALTA) 60 MG capsule Take 60 mg by mouth daily.  5  . ferrous sulfate 325 (65 FE) MG tablet Take 325 mg by mouth daily with breakfast.    . gabapentin (NEURONTIN) 300 MG capsule Take 300 mg by mouth at bedtime.    Marland Kitchen glucose ([employer] GLUCOSE) 4 GM chewable tablet Chew 1 tablet by  mouth as needed for low blood sugar.    . hydrALAZINE (APRESOLINE) 25 MG tablet Take 25 mg by mouth as directed.    . insulin aspart (NOVOLOG) 100 UNIT/ML injection Inject 12-16 Units into the skin 3 (three) times daily with meals. Per sliding scale. 14 units taken last 11/09/15    . Insulin Degludec (TRESIBA FLEXTOUCH) 100 UNIT/ML SOPN Inject 48 Units into the skin at bedtime.    Marland Kitchen ipratropium-albuterol (DUONEB) 0.5-2.5 (3) MG/3ML SOLN Take 3 mLs by nebulization 2 (two) times daily as needed (for shortnes of breath).     . isosorbide mononitrate (IMDUR) 30 MG 24 hr tablet Take 15 mg by mouth daily.    . lansoprazole (PREVACID) 15 MG capsule Take 15 mg by mouth 2 (two) times daily before a meal.    . levothyroxine (SYNTHROID, LEVOTHROID) 50 MCG tablet Take 50 mcg by mouth daily before breakfast.   6  . Multiple Vitamins-Minerals (WOMENS 50+ MULTI VITAMIN/MIN) TABS Take 1 tablet by mouth daily.    . nitroGLYCERIN (NITROSTAT) 0.4 MG SL tablet Place 0.4 mg under the tongue every 5 (five) minutes as needed for chest pain (x 3 doses). For chest pain    . NOVOLOG FLEXPEN 100 UNIT/ML FlexPen 70 -199 TAKE 12 UNITS, 200-299 TAKE 14 UNITS, 300 OR HIGHER 16 UNITS SUBCUTANEOUS  4  . OXYGEN-HELIUM IN Inhale 4 L into the lungs at  bedtime.     . Probiotic Product (PROBIOTIC PO) Take 1 tablet by mouth 2 (two) times daily.    . simvastatin (ZOCOR) 40 MG tablet Take 40 mg by mouth every evening.     . torsemide (DEMADEX) 20 MG tablet Take 1 tablet (20 mg total) by mouth once. 120 tablet 0  . guaiFENesin (ROBITUSSIN) 100 MG/5ML SOLN Take 5 mLs (100 mg total) by mouth every 4 (four) hours as needed for cough or to loosen phlegm. (Patient not taking: Reported on 12/22/2015) 236 mL 0  . traMADol (ULTRAM) 50 MG tablet Take 50 mg by mouth every 8 (eight) hours as needed for moderate pain (body  pain). Reported on 12/22/2015  0  . [DISCONTINUED] dexlansoprazole (DEXILANT) 60 MG capsule Take 60 mg by mouth daily.     No  current facility-administered medications for this visit.    Functional Status:   In your present state of health, do you have any difficulty performing the following activities: 12/11/2015 11/10/2015  Hearing? N N  Vision? N N  Difficulty concentrating or making decisions? N N  Walking or climbing stairs? Y N  Dressing or bathing? N N  Doing errands, shopping? N N  Preparing Food and eating ? N -  Using the Toilet? N -  In the past six months, have you accidently leaked urine? Y -  Do you have problems with loss of bowel control? N -  Managing your Medications? N -  Managing your Finances? N -  Housekeeping or managing your Housekeeping? N -    Fall/Depression Screening:    PHQ 2/9 Scores 12/11/2015 10/13/2015 05/07/2015 04/06/2015 03/09/2015 01/28/2015  PHQ - 2 Score 0 0 0 1 0 0   Fall Risk  12/11/2015 10/13/2015 05/07/2015 03/09/2015 01/28/2015  Falls in the past year? Yes - Yes No No  Number falls in past yr: 2 or more - 1 - -  Injury with Fall? No - No - -  Risk Factor Category  High Fall Risk - - - -  Risk for fall due to : History of fall(s) History of fall(s) Impaired balance/gait Other (Comment) -  Risk for fall due to (comments): - - - back pain -  Follow up Falls prevention discussed - Falls prevention discussed - -     Assessment:    Member has completed the transition of care program.  She continues to self monitor (weights, blood sugars and blood pressure) without problems.  Her weight has been stable/consistent since discharge.  She denies any problems with medication management.  She continues to adjust her insulin as instructed by her physician, and denies any hypoglycemic episodes.  She had a follow up appointment with her PCP yesterday, one with her pulmonologist next week, and another with her endocrinologist next month.    She denies any further needs for community care management, but is open to working with the health coach for continued education.    Plan:   Will  make referral to health coach for continued education on diabetes and heart failure.  THN CM Care Plan Problem One        Most Recent Value   Care Plan Problem One  Recent hospitalization   Role Documenting the Problem One  Care Management [place] for Problem One  Active   THN Long Term Goal (31-90 days)  Member will not be readmitted to hospital within the next 31 days   THN Long Term Goal Start Date  11/20/15   THN Long Term Goal Met Date  12/22/15   Interventions for Problem One Long Term Goal  Discussed with member the importance of following discharge instructions, including follow up appointments, medications, diet, and home health involvement, to decrease the risk of readmission   THN CM Short Term Goal #1 (0-30 days)  Member will take all medications as prescried over the next 4 weeks   THN CM Short Term Goal #1 Start Date  11/20/15   [facility] CM Short Term Goal #1 Met Date  12/22/15   Interventions for Short Term Goal #1  Instructed member to obtain new fluid medicine (Torsemide) from pharmacy today.  Discussed with member the importance of following discharge instructions, including follow up appointments, medications, diet, and home health involvement, to decrease the risk of readmission   THN CM Short Term Goal #2 (0-30 days)  Member will have appointment with cardiologist and PCP within the next 4 weeks   THN CM Short Term Goal #2 Start Date  11/20/15   [facility] CM Short Term Goal #2 Met Date  12/22/15   Interventions for Short Term Goal #2  Instructed member to schedule appointments prior to going out of town.  Discussed with member the importance of following discharge instructions, including follow up appointments, medications, diet, and home health involvement, to decrease the risk of readmission     Valente David, BSN, [place] Manager [phone]

## 2015-12-23 ENCOUNTER — Ambulatory Visit (INDEPENDENT_AMBULATORY_CARE_PROVIDER_SITE_OTHER): Payer: Medicare Other | Admitting: Podiatry

## 2015-12-23 ENCOUNTER — Encounter: Payer: Self-pay | Admitting: Podiatry

## 2015-12-23 DIAGNOSIS — M79676 Pain in unspecified toe(s): Secondary | ICD-10-CM

## 2015-12-23 DIAGNOSIS — B351 Tinea unguium: Secondary | ICD-10-CM

## 2015-12-23 NOTE — Patient Instructions (Signed)
Diabetes and Foot Care Diabetes may cause you to have problems because of poor blood supply (circulation) to your feet and legs. This may cause the skin on your feet to become thinner, break easier, and heal more slowly. Your skin may become dry, and the skin may peel and crack. You may also have nerve damage in your legs and feet causing decreased feeling in them. You may not notice minor injuries to your feet that could lead to infections or more serious problems. Taking care of your feet is one of the most important things you can do for yourself.  HOME CARE INSTRUCTIONS  Wear shoes at all times, even in the house. Do not go barefoot. Bare feet are easily injured.  Check your feet daily for blisters, cuts, and redness. If you cannot see the bottom of your feet, use a mirror or ask someone for help.  Wash your feet with warm water (do not use hot water) and mild soap. Then pat your feet and the areas between your toes until they are completely dry. Do not soak your feet as this can dry your skin.  Apply a moisturizing lotion or petroleum jelly (that does not contain alcohol and is unscented) to the skin on your feet and to dry, brittle toenails. Do not apply lotion between your toes.  Trim your toenails straight across. Do not dig under them or around the cuticle. File the edges of your nails with an emery board or nail file.  Do not cut corns or calluses or try to remove them with medicine.  Wear clean socks or stockings every day. Make sure they are not too tight. Do not wear knee-high stockings since they may decrease blood flow to your legs.  Wear shoes that fit properly and have enough cushioning. To break in new shoes, wear them for just a few hours a day. This prevents you from injuring your feet. Always look in your shoes before you put them on to be sure there are no objects inside.  Do not cross your legs. This may decrease the blood flow to your feet.  If you find a minor scrape,  cut, or break in the skin on your feet, keep it and the skin around it clean and dry. These areas may be cleansed with mild soap and water. Do not cleanse the area with peroxide, alcohol, or iodine.  When you remove an adhesive bandage, be sure not to damage the skin around it.  If you have a wound, look at it several times a day to make sure it is healing.  Do not use heating pads or hot water bottles. They may burn your skin. If you have lost feeling in your feet or legs, you may not know it is happening until it is too late.  Make sure your health care provider performs a complete foot exam at least annually or more often if you have foot problems. Report any cuts, sores, or bruises to your health care provider immediately. SEEK MEDICAL CARE IF:   You have an injury that is not healing.  You have cuts or breaks in the skin.  You have an ingrown nail.  You notice redness on your legs or feet.  You feel burning or tingling in your legs or feet.  You have pain or cramps in your legs and feet.  Your legs or feet are numb.  Your feet always feel cold. SEEK IMMEDIATE MEDICAL CARE IF:   There is increasing redness,   swelling, or pain in or around a wound.  There is a red line that goes up your leg.  Pus is coming from a wound.  You develop a fever or as directed by your health care provider.  You notice a bad smell coming from an ulcer or wound.   This information is not intended to replace advice given to you by your health care provider. Make sure you discuss any questions you have with your health care provider.   Document Released: 10/07/2000 Document Revised: 06/12/2013 Document Reviewed: 03/19/2013 Elsevier Interactive Patient Education 2016 Elsevier Inc.  

## 2015-12-23 NOTE — Progress Notes (Signed)
Patient ID: [name], female   DOB: [birth year], 70 y.o.   MRN: [id number]   Subjective: This patient presents again today complaining of painful toenails when wearing shoes and walking and request debridement of toenails  Objective: No open skin lesions bilaterally Toenails 2-5 bilaterally are elongated, hypertrophic, discolored and tender to direct palpation 8  Symptomatic onychomycoses 8 Type II diabetic History of peripheral neuropathy History of peripheral arterial disease  Plan: Debridement toenails 8 mechanically an electrically without any bleeding  Reappoint 3 months

## 2015-12-30 ENCOUNTER — Ambulatory Visit (INDEPENDENT_AMBULATORY_CARE_PROVIDER_SITE_OTHER): Payer: Medicare Other | Admitting: Adult Health

## 2015-12-30 ENCOUNTER — Encounter: Payer: Self-pay | Admitting: Adult Health

## 2015-12-30 VITALS — BP 132/72 | HR 67 | Temp 98.3°F | Ht 62.0 in | Wt 181.0 lb

## 2015-12-30 DIAGNOSIS — J9611 Chronic respiratory failure with hypoxia: Secondary | ICD-10-CM | POA: Diagnosis not present

## 2015-12-30 DIAGNOSIS — J449 Chronic obstructive pulmonary disease, unspecified: Secondary | ICD-10-CM | POA: Diagnosis not present

## 2015-12-30 DIAGNOSIS — I5022 Chronic systolic (congestive) heart failure: Secondary | ICD-10-CM

## 2015-12-30 NOTE — Assessment & Plan Note (Signed)
Continue on current regimen .  Order to light weight portable concentrator for oxygen .  follow up Dr. Marchelle Gearingamaswamy in 3-4 months and As needed

## 2015-12-30 NOTE — Addendum Note (Signed)
Addended by: Karalee HeightOX, Jerome Viglione P on: 12/30/2015 01:54 PM   Modules accepted: Orders

## 2015-12-30 NOTE — Progress Notes (Signed)
Subjective:    Patient ID: [name], female    DOB: 07/30/1946, 71 y.o.   MRN: [id number]  HPI 89 female former smoker with CAD , Ischemic CM , Chronic Systiolic CHF (EF as low as 20%) , subglottis stenosis s/p tracheostomy.     12/30/2015 Follow up : Chronic Resp Failure /Trach dependent Pt returns for 2 month follow up .  Seen last ov started on portable O2 with act. Says she did not get POC . She has old POC at home from 3 yr ago but too heavy to carry.  She underwent screening CT chest that showed benign appearance with recs to repeat in 1 year.  She says she still gets winded with walking. Has minimal dry cough . But overall breathing is slightly better . Was readmitted recently for CHF exacerbation. Feels better with less dyspnea /edema.   Denies any chest congestion/tightness, sinus pressure/drainage, fever, nausea or vomiting.   Past Medical History  Diagnosis Date  . Diabetes mellitus     on Lantus 30 U   . CAD (coronary artery disease)     a. S/p CABG and stenting b. L&R cath 01/09/15 LM patent, LAD 100% occluded, LCX moderate disease, RCA 100% occluded. SVG-RCA CTO, SVG-mid LAD patent with more distal LAD 70% stenosis. Recs for medical management.   Marland Kitchen PAD (peripheral artery disease) (HCC)   . Stroke [facility])     MRI 11/2011 with remote occipital lobe. MRA with moderate left focal vertebral artery stenosis  . Chronic combined systolic and diastolic CHF (congestive heart failure) (HCC)     a. 11/2011 Echo with EF 30-35%, global hypokinesis, and inferior akinesis;  b. 12/2014 Echo: EF 50%, mod LVH, Ao sclerosis w/o stenosis, mildly dil LA, mild to mod RV dysfxn.  . Hyperlipidemia   . DDD (degenerative disc disease), lumbar   . MI (myocardial infarction) (HCC) 1997  . COPD (chronic obstructive pulmonary disease) (HCC)     a. prn and HS supplemental O2.  . Anemia   . Hypertension   . GERD (gastroesophageal reflux disease)   . History of IBS   . Hypothyroidism     Goiter  .  Carotid artery occlusion   . CKD (chronic kidney disease), stage III     stage 3  . History of tracheostomy (HCC) 08/26/2013    Dr. Jenne Pane  . Shortness of breath dyspnea    Current Outpatient Prescriptions on File Prior to Visit  Medication Sig Dispense Refill  . aspirin EC 81 MG tablet Take 81 mg by mouth daily.    . carvedilol (COREG) 12.5 MG tablet Take 1 tablet (12.5 mg total) by mouth 2 (two) times daily with a meal. 60 tablet 0  . clopidogrel (PLAVIX) 75 MG tablet Take 75 mg by mouth daily.    Marland Kitchen dicyclomine (BENTYL) 20 MG tablet Take 20 mg by mouth 3 (three) times daily before meals.    . DULoxetine (CYMBALTA) 60 MG capsule Take 60 mg by mouth daily.  5  . ferrous sulfate 325 (65 FE) MG tablet Take 325 mg by mouth daily with breakfast.    . gabapentin (NEURONTIN) 300 MG capsule Take 300 mg by mouth at bedtime.    Marland Kitchen glucose ([employer] GLUCOSE) 4 GM chewable tablet Chew 1 tablet by mouth as needed for low blood sugar.    Marland Kitchen guaiFENesin (ROBITUSSIN) 100 MG/5ML SOLN Take 5 mLs (100 mg total) by mouth every 4 (four) hours as needed for cough or to loosen  phlegm. 236 mL 0  . hydrALAZINE (APRESOLINE) 25 MG tablet Take 25 mg by mouth as directed.    . insulin aspart (NOVOLOG) 100 UNIT/ML injection Inject 12-16 Units into the skin 3 (three) times daily with meals. Per sliding scale. 14 units taken last 11/09/15    . Insulin Degludec (TRESIBA FLEXTOUCH) 100 UNIT/ML SOPN Inject 48 Units into the skin at bedtime.    Marland Kitchen. ipratropium-albuterol (DUONEB) 0.5-2.5 (3) MG/3ML SOLN Take 3 mLs by nebulization 2 (two) times daily as needed (for shortnes of breath).     . isosorbide mononitrate (IMDUR) 30 MG 24 hr tablet Take 15 mg by mouth daily.    . lansoprazole (PREVACID) 15 MG capsule Take 15 mg by mouth 2 (two) times daily before a meal.    . levothyroxine (SYNTHROID, LEVOTHROID) 50 MCG tablet Take 50 mcg by mouth daily before breakfast.   6  . Multiple Vitamins-Minerals (WOMENS 50+ MULTI VITAMIN/MIN) TABS Take 1  tablet by mouth daily.    . nitroGLYCERIN (NITROSTAT) 0.4 MG SL tablet Place 0.4 mg under the tongue every 5 (five) minutes as needed for chest pain (x 3 doses). For chest pain    . NOVOLOG FLEXPEN 100 UNIT/ML FlexPen 70 -199 TAKE 12 UNITS, 200-299 TAKE 14 UNITS, 300 OR HIGHER 16 UNITS SUBCUTANEOUS  4  . OXYGEN-HELIUM IN Inhale 4 L into the lungs at bedtime.     . Probiotic Product (PROBIOTIC PO) Take 1 tablet by mouth 2 (two) times daily.    . simvastatin (ZOCOR) 40 MG tablet Take 40 mg by mouth every evening.     . torsemide (DEMADEX) 20 MG tablet Take 1 tablet (20 mg total) by mouth once. 120 tablet 0  . traMADol (ULTRAM) 50 MG tablet Take 50 mg by mouth every 8 (eight) hours as needed for moderate pain (body  pain). Reported on 12/22/2015  0  . [DISCONTINUED] dexlansoprazole (DEXILANT) 60 MG capsule Take 60 mg by mouth daily.     No current facility-administered medications on file prior to visit.    Review of Systems Constitutional:   No  weight loss, night sweats,  Fevers, chills, +atigue, or  lassitude.  HEENT:   No headaches,  Difficulty swallowing,  Tooth/dental problems, or  Sore throat,                No sneezing, itching, ear ache, nasal congestion, post nasal drip,   CV:  No chest pain,  Orthopnea, PND, swelling in lower extremities, anasarca, dizziness, palpitations, syncope.   GI  No heartburn, indigestion, abdominal pain, nausea, vomiting, diarrhea, change in bowel habits, loss of appetite, bloody stools.   Resp:    No chest wall deformity  Skin: no rash or lesions.  GU: no dysuria, change in color of urine, no urgency or frequency.  No flank pain, no hematuria   MS:  No joint pain or swelling.  No decreased range of motion.  No back pain.  Psych:  No change in mood or affect. No depression or anxiety.  No memory loss.         Objective:   Physical Exam  Filed Vitals:   12/30/15 1152  BP: 132/72  Pulse: 67  Temp: 98.3 F (36.8 C)  TempSrc: Oral  Height:  5\' 2"  (1.575 m)  Weight: 181 lb (82.101 kg)  SpO2: 93%   GEN: A/Ox3; pleasant , NAD , elderly    HEENT:  Steele/AT,  EACs-clear, TMs-wnl, NOSE-clear, THROAT-clear, no lesions, no postnasal drip or  exudate noted.   NECK:  Supple w/ fair ROM; no JVD; normal carotid impulses w/o bruits; no thyromegaly or nodules palpated; no lymphadenopathy.Trach midline, dressing clean and dry   RESP  Clear  P & A; w/o, wheezes/ rales/ or rhonchi.no accessory muscle use, no dullness to percussion  CARD:  RRR, no m/r/g  , tr  peripheral edema, pulses intact, no cyanosis or clubbing.  GI:   Soft & nt; nml bowel sounds; no organomegaly or masses detected.  Musco: Warm bil, no deformities or joint swelling noted.   Neuro: alert, no focal deficits noted.    Skin: Warm, no lesions or rashes  Tammy Parrett NP-C  [facility]  12/30/2015       Assessment & Plan:

## 2015-12-30 NOTE — Assessment & Plan Note (Signed)
Compensated without flare  Cont on current regimen  

## 2015-12-30 NOTE — Assessment & Plan Note (Signed)
Recent flare now resolved Cont w/ follow up with card

## 2015-12-30 NOTE — Patient Instructions (Signed)
Continue on current regimen .  Order to light weight portable concentrator for oxygen .  follow up Dr. Ramaswamy in 3-4 months and As needed   

## 2016-01-18 ENCOUNTER — Ambulatory Visit: Payer: Self-pay | Admitting: *Deleted

## 2016-01-21 ENCOUNTER — Encounter: Payer: Self-pay | Admitting: *Deleted

## 2016-01-21 NOTE — Patient Outreach (Signed)
[facility] Care Management  01/21/2016  Scot JunDorothy I Escoto 02/28/1946 [id number]   This patient is being followed by Palliative Care. Discussed with Livia SnellenGeronda Pulliam and RN Health Coach will close this case out. Telephone call to Lamont Snowballammy Patterson [phone] Palliative Care and left message to make aware RN Health Coach is closing out. Referrals to external programs do not require co-management. Letter sent to patient and physician.  Gean MaidensFrances Fujie Dickison BSN RN [facility] [phone]

## 2016-01-25 ENCOUNTER — Other Ambulatory Visit: Payer: Self-pay | Admitting: *Deleted

## 2016-01-25 MED ORDER — TORSEMIDE 20 MG PO TABS: 20.0000 mg | ORAL_TABLET | Freq: Once | ORAL | Status: DC

## 2016-01-29 ENCOUNTER — Ambulatory Visit: Payer: Medicare Other | Admitting: Family

## 2016-01-29 ENCOUNTER — Encounter (HOSPITAL_COMMUNITY): Payer: Medicare Other

## 2016-02-04 ENCOUNTER — Other Ambulatory Visit: Payer: Self-pay | Admitting: *Deleted

## 2016-02-04 MED ORDER — CARVEDILOL 12.5 MG PO TABS: 12.5000 mg | ORAL_TABLET | Freq: Two times a day (BID) | ORAL | Status: DC

## 2016-02-08 ENCOUNTER — Encounter: Payer: Self-pay | Admitting: Cardiology

## 2016-02-08 ENCOUNTER — Ambulatory Visit (INDEPENDENT_AMBULATORY_CARE_PROVIDER_SITE_OTHER): Payer: Medicare Other | Admitting: Cardiology

## 2016-02-08 VITALS — BP 132/66 | HR 74 | Ht 62.0 in | Wt 176.6 lb

## 2016-02-08 DIAGNOSIS — I10 Essential (primary) hypertension: Secondary | ICD-10-CM

## 2016-02-08 DIAGNOSIS — Z951 Presence of aortocoronary bypass graft: Secondary | ICD-10-CM | POA: Diagnosis not present

## 2016-02-08 DIAGNOSIS — Z930 Tracheostomy status: Secondary | ICD-10-CM | POA: Diagnosis not present

## 2016-02-08 DIAGNOSIS — I5023 Acute on chronic systolic (congestive) heart failure: Secondary | ICD-10-CM

## 2016-02-08 DIAGNOSIS — Z79899 Other long term (current) drug therapy: Secondary | ICD-10-CM | POA: Diagnosis not present

## 2016-02-08 MED ORDER — TORSEMIDE 20 MG PO TABS: 40.0000 mg | ORAL_TABLET | Freq: Once | ORAL | Status: DC

## 2016-02-08 NOTE — Progress Notes (Signed)
[address] [place], [place]  [address] Phone: [phone] Fax:  [phone]  Date:  02/08/2016   ID:  [name], DOB [birth year], MRN [id number]  PCP:  Becky Bradford, MD   History of Present Illness: [name] is a 70 y.o. female  discharged on 01/04/13 with COPD, chronic systolic congestive heart failure with EF of previously 20-25%, most recent 40%. She has tracheostomy, placed by [name]. She has been on chronic oxygen therapy secondary to COPD as well as systolic heart failure.  Cardiac catheterization in March 2016 resulted in continued medical management.  She was hospitalized in late January 2017 with shortness of breath. Torsemide was utilized 40 mg twice a day then decrease to 40 mg once a day secondary to increased BUN.  No specific sputum production.   Lower extremity edema improved. Weight was 177 pounds on discharge.    We did have to decrease her torsemide down to 20 mg after her creatinine was 1.72. Unfortunately, she has some more abdominal distention and is feeling some increased chronic shortness of breath with and without activity. Even though her weight is 176, stable from discharge, it may behoove [place] to try increased diuretic once again.  Left LE bypass, [name]. Plavix. Hemoglobin A1c 9.7 uncontrolled. Prior BNP 982.     Wt Readings from Last 3 Encounters:  02/08/16 176 lb 9.6 oz (80.105 kg)  12/30/15 181 lb (82.101 kg)  12/22/15 172 lb (78.019 kg)     Past Medical History  Diagnosis Date  . Diabetes mellitus     on Lantus 30 U   . CAD (coronary artery disease)     a. S/p CABG and stenting b. L&R cath 01/09/15 LM patent, LAD 100% occluded, LCX moderate disease, RCA 100% occluded. SVG-RCA CTO, SVG-mid LAD patent with more distal LAD 70% stenosis. Recs for medical management.   Marland Kitchen PAD (peripheral artery disease) (HCC)   . Stroke [facility])     MRI 11/2011 with remote occipital lobe. MRA with moderate left focal  vertebral artery stenosis  . Chronic combined systolic and diastolic CHF (congestive heart failure) (HCC)     a. 11/2011 Echo with EF 30-35%, global hypokinesis, and inferior akinesis;  b. 12/2014 Echo: EF 50%, mod LVH, Ao sclerosis w/o stenosis, mildly dil LA, mild to mod RV dysfxn.  . Hyperlipidemia   . DDD (degenerative disc disease), lumbar   . MI (myocardial infarction) (HCC) 1997  . COPD (chronic obstructive pulmonary disease) (HCC)     a. prn and HS supplemental O2.  . Anemia   . Hypertension   . GERD (gastroesophageal reflux disease)   . History of IBS   . Hypothyroidism     Goiter  . Carotid artery occlusion   . CKD (chronic kidney disease), stage III     stage 3  . History of tracheostomy (HCC) 08/26/2013    [name]  . Shortness of breath dyspnea     Past Surgical History  Procedure Laterality Date  . Ptca    . Thyroidectomy    . Coronary artery bypass graft      2 vessel  . Carotid endarterectomy  ~2008    Left   . Cholecystectomy    . Tracheostomy tube placement  01/02/2012  . Angioplasty  [id number]    Aortogram by Dr. Italy McKenzie [facility])  . Pr vein bypass graft,aorto-fem-pop      Right common femoral-AK popliteal BPG &  Right Popliteal-posterior tibial  . Pr vein bypass graft,aorto-fem-pop      Left Fem-pop BPG  . Carpal tunnel release Right   . Femoral-tibial bypass graft Left 06/11/2013    Procedure: BYPASS GRAFT  LEFT FEMORAL- POSTERIOR TIBIAL ARTERY/ REDO;  Surgeon: Sherren Kerns, MD;  Location: [facility] OR;  Service: Vascular;  Laterality: Left;  . Thrombectomy femoral artery Left 06/11/2013    Procedure: THROMBECTOMY FEMORAL ARTERY;  Surgeon: Sherren Kerns, MD;  Location: [facility] OR;  Service: Vascular;  Laterality: Left;  . Spine surgery  Oct. 27, 2014    Injection - Back  . Abdominal aortagram N/A 04/05/2013    Procedure: ABDOMINAL Ronny Flurry;  Surgeon: Sherren Kerns, MD;  Location: [facility] CATH LAB;  Service: Cardiovascular;  Laterality: N/A;  .  Colonoscopy N/A 10/27/2014    Procedure: COLONOSCOPY;  Surgeon: Willis Modena, MD;  Location: [facility] ENDOSCOPY;  Service: Endoscopy;  Laterality: N/A;  . Left and right heart catheterization with coronary angiogram N/A 01/09/2015    Procedure: LEFT AND RIGHT HEART CATHETERIZATION WITH CORONARY ANGIOGRAM;  Surgeon: Marykay Lex, MD;  Location: [facility] CATH LAB;  Service: Cardiovascular;  Laterality: N/A;    Current Outpatient Prescriptions  Medication Sig Dispense Refill  . aspirin EC 81 MG tablet Take 81 mg by mouth daily.    . carvedilol (COREG) 12.5 MG tablet Take 1 tablet (12.5 mg total) by mouth 2 (two) times daily with a meal. 60 tablet 11  . clopidogrel (PLAVIX) 75 MG tablet Take 75 mg by mouth daily.    Marland Kitchen dicyclomine (BENTYL) 20 MG tablet Take 20 mg by mouth 3 (three) times daily before meals.    . DULoxetine (CYMBALTA) 60 MG capsule Take 60 mg by mouth daily.  5  . ferrous sulfate 325 (65 FE) MG tablet Take 325 mg by mouth daily with breakfast.    . gabapentin (NEURONTIN) 300 MG capsule Take 300 mg by mouth at bedtime.    Marland Kitchen glucose ([employer]S GLUCOSE) 4 GM chewable tablet Chew 1 tablet by mouth as needed for low blood sugar.    Marland Kitchen guaiFENesin (ROBITUSSIN) 100 MG/5ML SOLN Take 5 mLs (100 mg total) by mouth every 4 (four) hours as needed for cough or to loosen phlegm. 236 mL 0  . hydrALAZINE (APRESOLINE) 25 MG tablet Take 25 mg by mouth as directed.    . insulin aspart (NOVOLOG) 100 UNIT/ML injection Inject 12-16 Units into the skin 3 (three) times daily with meals. Per sliding scale. 14 units taken last 11/09/15    . Insulin Degludec (TRESIBA FLEXTOUCH) 100 UNIT/ML SOPN Inject 48 Units into the skin at bedtime.    Marland Kitchen ipratropium-albuterol (DUONEB) 0.5-2.5 (3) MG/3ML SOLN Take 3 mLs by nebulization 2 (two) times daily as needed (for shortnes of breath).     . isosorbide mononitrate (IMDUR) 30 MG 24 hr tablet Take 15 mg by mouth daily.    . lansoprazole (PREVACID) 15 MG capsule Take 15 mg by mouth 2 (two)  times daily before a meal.    . levothyroxine (SYNTHROID, LEVOTHROID) 50 MCG tablet Take 50 mcg by mouth daily before breakfast.   6  . Multiple Vitamins-Minerals (WOMENS 50+ MULTI VITAMIN/MIN) TABS Take 1 tablet by mouth daily.    . nitroGLYCERIN (NITROSTAT) 0.4 MG SL tablet Place 0.4 mg under the tongue every 5 (five) minutes as needed for chest pain (x 3 doses). For chest pain    . NOVOLOG FLEXPEN 100 UNIT/ML FlexPen 70 -199 TAKE 12 UNITS, 200-299 TAKE  14 UNITS, 300 OR HIGHER 16 UNITS SUBCUTANEOUS  4  . OXYGEN-HELIUM IN Inhale 4 L into the lungs at bedtime.     . prednisoLONE acetate (PRED FORTE) 1 % ophthalmic suspension PLACE 1 DROP TO OPERATIVE EYE 4 TIMES DAILY (RIGHT EYE) START AFTER SURGERY  0  . Probiotic Product (PROBIOTIC PO) Take 1 tablet by mouth 2 (two) times daily.    . simvastatin (ZOCOR) 40 MG tablet Take 40 mg by mouth every evening.     . torsemide (DEMADEX) 20 MG tablet Take 1 tablet (20 mg total) by mouth once. 90 tablet 2  . traMADol (ULTRAM) 50 MG tablet Take 50 mg by mouth every 8 (eight) hours as needed for moderate pain (body  pain). Reported on 12/22/2015  0  . VIGAMOX 0.5 % ophthalmic solution INSTILL 1 DROP 4X A DAY STARTING 2 DAYS PRIOR TO SURGERY AND 2 DROPS MORNING OF SURGERY RIGHT EYE  0  . [DISCONTINUED] dexlansoprazole (DEXILANT) 60 MG capsule Take 60 mg by mouth daily.     No current facility-administered medications for this visit.    Allergies:    Allergies  Allergen Reactions  . Aldactone [Spironolactone] Other (See Comments)    Severe hyperkalemia   . Lisinopril Other (See Comments) and Cough    Hypotension also  . Crestor [Rosuvastatin Calcium] Other (See Comments)    Muscle Pain  . Vicodin [Hydrocodone-Acetaminophen] Nausea And Vomiting    Social History:  The patient  reports that she quit smoking about 4 years ago. Her smoking use included Cigarettes. She has a 50 pack-year smoking history. She has never used smokeless tobacco. She reports  that she does not drink alcohol or use illicit drugs.   ROS:  Please see the history of present illness.   Denies any lower extremity pain, recent left lower extremity bypass, no fevers, no chills, no cough, no syncope, no significant orthopnea. Occasional shortness of breath.    PHYSICAL EXAM: VS:  BP 132/66 mmHg  Pulse 74  Ht  (1.575 m)  Wt 176 lb 9.6 oz (80.105 kg)  BMI 32.29 kg/m2 Well nourished, well developed, in no acute distress HEENT: Tracheostomy noted midline. Neck: no JVD Cardiac:  normal S1, S2; RRR; no murmur Lungs:  clear to auscultation bilaterally, no wheezing, rhonchi or rales Abd: soft, nontender, no hepatomegaly Ext: no edema, stockings,  left dimishished pulsed,  Skin: warm and dry Neuro: no focal abnormalities noted  Labs: 09/11/13-Sodium 139, potassium 4.4, creatinine 1.2  ECHO: 07/28/15: - Left ventricle: The cavity size was normal. There was moderate  concentric hypertrophy. Systolic function was moderately reduced.  The estimated ejection fraction was in the range of 35% to 40%.  There is akinesis of the basalinferior myocardium. Doppler  parameters are consistent with abnormal left ventricular  relaxation (grade 1 diastolic dysfunction). Doppler parameters  are consistent with high ventricular filling pressure. - Ventricular septum: Septal motion showed abnormal function and  dyssynergy. - Mitral valve: Moderately calcified annulus. There was mild  regurgitation. - Left atrium: The atrium was moderately dilated.  Impressions:  - EF is decreased when compared to prior echocardiogram however EF  is similar to 2014 echocardiogram.  ECG: 07/17/14-sinus rhythm, first degree AV block, 240 ms PR interval, poor R wave progression, nonspecific ST-T wave changes, QTC 498  ASSESSMENT AND PLAN:  1. Chronic systolic heart failure-EF 25%. Her current EF is 35-40%. Torsemide was originally 40 mg twice a day however BUN and creatinine increased on  12/04/15 and  this was decreased down to 20 mg once a day. Since she is now having more symptoms of chronic shortness of breath, I will go ahead and increase her torsemide back to 40 mg once a day.  We will check basic metabolic profile in one week. 2. CAD-status post bypass surgery. Overall doing well. No anginal symptoms. 3. Old myocardial infarction-1997. 4. Status post tracheostomy-Dr. Jenne PaneBates. Subglottis stenosis with chronic tracheostomy 5. Chronic anemia-mild. 6. Hypertension-currently well controlled. 7. Peripheral vascular disease-as described above. Recent left lower extremity bypass. Dr Darrick PennaFields 8. COPD-could also be playing a role in her shortness of breath. On home O2.  9. 2 month follow up, with Tereso NewcomerScott Weaver  Signed, Donato SchultzMark Arlo Buffone, MD [facility]  02/08/2016 2:03 PM

## 2016-02-08 NOTE — Patient Instructions (Addendum)
Medication Instructions:  Please increase Torsemide to 20 mg (2) tablets every AM for a total of 40 mg a day. Continue all other medications as listed.  Labwork: Please have blood work in 1 week (BMP).  Follow-Up: Follow up in 2 months with [name].  If you need a refill on your cardiac medications before your next appointment, please call your pharmacy.  Thank you for choosing  [facility]!!

## 2016-02-15 ENCOUNTER — Other Ambulatory Visit (INDEPENDENT_AMBULATORY_CARE_PROVIDER_SITE_OTHER): Payer: Medicare Other | Admitting: *Deleted

## 2016-02-15 DIAGNOSIS — I10 Essential (primary) hypertension: Secondary | ICD-10-CM

## 2016-02-15 DIAGNOSIS — Z79899 Other long term (current) drug therapy: Secondary | ICD-10-CM | POA: Diagnosis not present

## 2016-02-15 LAB — BASIC METABOLIC PANEL
BUN: 36 mg/dL — AB (ref 7–25)
CALCIUM: 8.4 mg/dL — AB (ref 8.6–10.4)
CO2: 28 mmol/L (ref 20–31)
CREATININE: 1.35 mg/dL — AB (ref 0.50–0.99)
Chloride: 108 mmol/L (ref 98–110)
GLUCOSE: 195 mg/dL — AB (ref 65–99)
Potassium: 4.4 mmol/L (ref 3.5–5.3)
SODIUM: 145 mmol/L (ref 135–146)

## 2016-03-07 ENCOUNTER — Other Ambulatory Visit: Payer: Self-pay | Admitting: *Deleted

## 2016-03-07 MED ORDER — TORSEMIDE 20 MG PO TABS: 40.0000 mg | ORAL_TABLET | Freq: Every day | ORAL | Status: DC

## 2016-03-29 ENCOUNTER — Other Ambulatory Visit: Payer: Self-pay | Admitting: *Deleted

## 2016-03-29 ENCOUNTER — Other Ambulatory Visit: Payer: Self-pay | Admitting: Family Medicine

## 2016-03-29 ENCOUNTER — Ambulatory Visit (INDEPENDENT_AMBULATORY_CARE_PROVIDER_SITE_OTHER): Payer: Medicare Other | Admitting: Podiatry

## 2016-03-29 ENCOUNTER — Encounter: Payer: Self-pay | Admitting: Podiatry

## 2016-03-29 DIAGNOSIS — B351 Tinea unguium: Secondary | ICD-10-CM | POA: Diagnosis not present

## 2016-03-29 DIAGNOSIS — Z1231 Encounter for screening mammogram for malignant neoplasm of breast: Secondary | ICD-10-CM

## 2016-03-29 DIAGNOSIS — M79676 Pain in unspecified toe(s): Secondary | ICD-10-CM | POA: Diagnosis not present

## 2016-03-29 DIAGNOSIS — E0849 Diabetes mellitus due to underlying condition with other diabetic neurological complication: Secondary | ICD-10-CM

## 2016-03-29 MED ORDER — CARVEDILOL 12.5 MG PO TABS: 12.5000 mg | ORAL_TABLET | Freq: Two times a day (BID) | ORAL | Status: AC

## 2016-03-29 MED ORDER — GABAPENTIN 300 MG PO CAPS: 300.0000 mg | ORAL_CAPSULE | Freq: Every day | ORAL | Status: DC

## 2016-03-29 NOTE — Telephone Encounter (Signed)
Patient requesting a refill on clopidogrel. I don't see where Dr Anne FuSkains has refilled this for her. I questioned her as to whom has been refilling it for her and she said that Dr Anne FuSkains should refill it since he is the one who tells me how long to hold it prior to any surgeries. Ok to refill? Please advise. Thanks, MI

## 2016-03-29 NOTE — Patient Instructions (Signed)
Refill injured gabapentin 300 mg today take one at bedtime dispensed 90 doses  Diabetes and Foot Care Diabetes may cause you to have problems because of poor blood supply (circulation) to your feet and legs. This may cause the skin on your feet to become thinner, break easier, and heal more slowly. Your skin may become dry, and the skin may peel and crack. You may also have nerve damage in your legs and feet causing decreased feeling in them. You may not notice minor injuries to your feet that could lead to infections or more serious problems. Taking care of your feet is one of the most important things you can do for yourself.  HOME CARE INSTRUCTIONS  Wear shoes at all times, even in the house. Do not go barefoot. Bare feet are easily injured.  Check your feet daily for blisters, cuts, and redness. If you cannot see the bottom of your feet, use a mirror or ask someone for help.  Wash your feet with warm water (do not use hot water) and mild soap. Then pat your feet and the areas between your toes until they are completely dry. Do not soak your feet as this can dry your skin.  Apply a moisturizing lotion or petroleum jelly (that does not contain alcohol and is unscented) to the skin on your feet and to dry, brittle toenails. Do not apply lotion between your toes.  Trim your toenails straight across. Do not dig under them or around the cuticle. File the edges of your nails with an emery board or nail file.  Do not cut corns or calluses or try to remove them with medicine.  Wear clean socks or stockings every day. Make sure they are not too tight. Do not wear knee-high stockings since they may decrease blood flow to your legs.  Wear shoes that fit properly and have enough cushioning. To break in new shoes, wear them for just a few hours a day. This prevents you from injuring your feet. Always look in your shoes before you put them on to be sure there are no objects inside.  Do not cross your  legs. This may decrease the blood flow to your feet.  If you find a minor scrape, cut, or break in the skin on your feet, keep it and the skin around it clean and dry. These areas may be cleansed with mild soap and water. Do not cleanse the area with peroxide, alcohol, or iodine.  When you remove an adhesive bandage, be sure not to damage the skin around it.  If you have a wound, look at it several times a day to make sure it is healing.  Do not use heating pads or hot water bottles. They may burn your skin. If you have lost feeling in your feet or legs, you may not know it is happening until it is too late.  Make sure your health care provider performs a complete foot exam at least annually or more often if you have foot problems. Report any cuts, sores, or bruises to your health care provider immediately. SEEK MEDICAL CARE IF:   You have an injury that is not healing.  You have cuts or breaks in the skin.  You have an ingrown nail.  You notice redness on your legs or feet.  You feel burning or tingling in your legs or feet.  You have pain or cramps in your legs and feet.  Your legs or feet are numb.  Your feet  always feel cold. SEEK IMMEDIATE MEDICAL CARE IF:   There is increasing redness, swelling, or pain in or around a wound.  There is a red line that goes up your leg.  Pus is coming from a wound.  You develop a fever or as directed by your health care provider.  You notice a bad smell coming from an ulcer or wound.   This information is not intended to replace advice given to you by your health care provider. Make sure you discuss any questions you have with your health care provider.   Document Released: 10/07/2000 Document Revised: 06/12/2013 Document Reviewed: 03/19/2013 Elsevier Interactive Patient Education [employer].

## 2016-03-29 NOTE — Progress Notes (Signed)
Patient ID: [name], female   DOB: 10/31/1945, 70 y.o.   MRN: [id number]  Subjective: This patient presents again today complaining of painful toenails when wearing shoes and walking and request debridement of toenails Patient requests refill gabapentin 300 mg taken 1 at bedtime requesting 90 doses per Rx. Initial prescription prescribed by Dr. Charlsie Merlesregal. Patient denies any side effects from gabapentin  Objective: DP and PT pulses 0/4 bilaterally HAV left No open skin lesions bilaterally Toenails 2-5 bilaterally are elongated, hypertrophic, discolored and tender to direct palpation 8  Symptomatic onychomycoses 8 Type II diabetic History of peripheral neuropathy History of peripheral arterial disease  Plan: Debridement toenails 8 mechanically and electrically without any bleeding Reorder gabapentin 300 mg sig one at bedtime #90 refill 1   Reappoint 3 months

## 2016-03-31 ENCOUNTER — Encounter: Payer: Self-pay | Admitting: Internal Medicine

## 2016-03-31 ENCOUNTER — Ambulatory Visit (INDEPENDENT_AMBULATORY_CARE_PROVIDER_SITE_OTHER): Payer: Medicare Other | Admitting: Internal Medicine

## 2016-03-31 VITALS — BP 134/72 | HR 70 | Ht 62.0 in | Wt 173.4 lb

## 2016-03-31 DIAGNOSIS — J438 Other emphysema: Secondary | ICD-10-CM | POA: Diagnosis not present

## 2016-03-31 DIAGNOSIS — I255 Ischemic cardiomyopathy: Secondary | ICD-10-CM | POA: Diagnosis not present

## 2016-03-31 NOTE — Progress Notes (Signed)
Subjective:     Patient ID: [name], female   DOB: [birth year], 70 y.o.   MRN: [id number]  HPI   IOV 11/04/2015  Chief Complaint  Patient presents with  . Pulmonary Consult    Pt referred by Mary Sella, NP to establish care. Pt was admitted in October for respiratory failure. Pt c/o DOE, cough with little mucus production. Pt denies CP/tightness.    New evaluation referred by Filomena Jungling of palliative services [facility]. This evaluation is for chronic respiratory failure and chronic COPD. This in the setting of obesity, chronic status post tracheostomy and multiple medical problems including chronic systolic heart failure EF 35% October 2016  Patient gives most of the history. History is also reviewed from past medical chart in Epic EMR and also outside records. 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 Jenne Pane for stridor. Since then she's been on chronic tracheostomy. She uses oxygen for the tracheostomy at night. She uses a Passy-Muir valve to communicate. She is not on ventilator. At baseline she is tablet treat and carries out independent activities of daily living with a cane. At baseline she reports chronic cough and chronic shortness of breath for walking across a 2 bedroom apartment. She also has chronic edema. She is only on nocturnal oxygen and that the daytime uses oxygen when necessary based on subjective sensation of dyspnea but is only very occasionally. Medication review shows she is on duo neb which she says she uses 3 times a day. I do not see Pulmicort on an inhaled steroid in her medication list. Med review also shows she is on non-specific beta blocker Coreg  Currently she is feeling at baseline and there are no new acute issues. She is interested in lung cancer screening. She's never had CT scan of the chest before. We do not know  the severity of COPD because she is now status post tracheostomy.  Refused flu shot   Lab review Chest x-ray October 2016 and personally visualized looks wet  Walking desaturation test 185 feet 3 laps on room air: She walked very slowly and completed only half lap and she desaturated to 86% and had to stop because of dyspnea.       12/30/2015 Follow up : Chronic Resp Failure Janina Mayo dependent.  69 female former smoker with CAD , Ischemic CM , Chronic Systiolic CHF (EF as low as 20%) , subglottis stenosis s/p tracheostomy.  Pt returns for 2 month follow up .  Seen last ov started on portable O2 with act. Says she did not get POC . She has old POC at home from 3 yr ago but too heavy to carry.  She underwent screening CT chest that showed benign appearance with recs to repeat in 1 year.  She says she still gets winded with walking. Has minimal dry cough . But overall breathing is slightly better . Was readmitted recently for CHF exacerbation. Feels better with less dyspnea /edema.   Denies any chest congestion/tightness, sinus pressure/drainage, fever, nausea or vomiting.    OV 03/31/2016   Chief Complaint  Patient presents with  . Follow-up    pt c/o worsening fatigue and SOB with exertion, intermittent worsening edema.     FU COPD with trach on PMV due to subglottic stensosis  Overall well. Reports chronic fatigue as biggest smptom. Similar to Jan 2017; see CAT score  below. Cough and dyspnea not issues significantly. No symptoms of aecopd  CAT COPD Symptom & Quality of Life Score (GSK trademark) 0 is no burden. 5 is highest burden 11/04/2015   Never Cough -> Cough all the time 5  No phlegm in chest -> Chest is full of phlegm 5  No chest tightness -> Chest feels very tight 0  No dyspnea for 1 flight stairs/hill -> Very dyspneic for 1 flight of stairs 5  No limitations for ADL at home -> Very limited with ADL at home 0  Confident leaving home -> Not at all confident leaving home 0   Sleep soundly -> Do not sleep soundly because of lung condition 0  Lots of Energy -> No energy at all 5  TOTAL Score (max 40)  20         has a past medical history of Diabetes mellitus; CAD (coronary artery disease); PAD (peripheral artery disease) (HCC); Stroke [facility]); Chronic combined systolic and diastolic CHF (congestive heart failure) (HCC); Hyperlipidemia; DDD (degenerative disc disease), lumbar; MI (myocardial infarction) (HCC) (1997); COPD (chronic obstructive pulmonary disease) (HCC); Anemia; Hypertension; GERD (gastroesophageal reflux disease); History of IBS; Hypothyroidism; Carotid artery occlusion; CKD (chronic kidney disease), stage III; History of tracheostomy (HCC) (08/26/2013); and Shortness of breath dyspnea.   reports that she quit smoking about 4 years ago. Her smoking use included Cigarettes. She has a 50 pack-year smoking history. She has never used smokeless tobacco.  Past Surgical History  Procedure Laterality Date  . Ptca    . Thyroidectomy    . Coronary artery bypass graft      2 vessel  . Carotid endarterectomy  ~2008    Left   . Cholecystectomy    . Tracheostomy tube placement  01/02/2012  . Angioplasty  [id number]    Aort[facility] by Dr. [place] McKenzie [facility])  . Pr vein bypass graft,aorto-fem-pop      Right common femoral-AK popliteal BPG & Right Popliteal-posterior tibial  . Pr vein bypass graft,aorto-fem-pop      Left Fem-pop BPG  . Carpal tunnel release Right   . Femoral-tibial bypass graft Left 06/11/2013    Procedure: BYPASS GRAFT  LEFT FEMORAL- POSTERIOR TIBIAL ARTERY/ REDO;  Surgeon: Sherren Kerns, MD;  Location: [facility] OR;  Service: Vascular;  Laterality: Left;  . Thrombectomy femoral artery Left 06/11/2013    Procedure: THROMBECTOMY FEMORAL ARTERY;  Surgeon: Sherren Kerns, MD;  Location: [facility] OR;  Service: Vascular;  Laterality: Left;  . Spine surgery  Oct. 27, 2014    Injection - Back  . Abdominal aortagram N/A 04/05/2013    Procedure:  ABDOMINAL Ronny Flurry;  Surgeon: Sherren Kerns, MD;  Location[facility] CATH LAB;  Service: Cardiovascular;  Laterality: N/A;  . Colonoscopy N/A 10/27/2014    Procedure: COLONOSCOPY;  Surgeon: Willis Modena, MD;  Location: [facility] ENDOSCOPY;  Service: Endoscopy;  Laterality: N/A;  . Left and right heart catheterization with coronary angiogram N/A 01/09/2015    Procedure: LEFT AND RIGHT HEART CATHETERIZATION WITH CORONARY ANGIOGRAM;  Surgeon: Marykay Lex, MD;  Location: [facility] CATH LAB;  Service: Cardiovascular;  Laterality: N/A;    Allergies  Allergen Reactions  . Aldactone [Spironolactone] Other (See Comments)    Severe hyperkalemia   . Lisinopril Other (See Comments) and Cough    Hypotension also  . Crestor [Rosuvastatin Calcium] Other (See Comments)    Muscle Pain  . Vicodin [Hydrocodone-Acetaminophen] Nausea And Vomiting    Immunization History  Administered Date(s) Administered  . Influenza,inj,Quad  PF,36+ Mos 07/25/2015  . Pneumococcal Polysaccharide-23 01/02/2013, 01/11/2015    Family History  Problem Relation Age of Onset  . Hyperlipidemia Mother   . Other Mother     AAA  . Alzheimer's disease Mother   . Heart disease Mother   . Irregular heart beat Mother   . Diabetes Daughter   . Hypertension Daughter      Current outpatient prescriptions:  .  aspirin EC 81 MG tablet, Take 81 mg by mouth daily., Disp: , Rfl:  .  carvedilol (COREG) 12.5 MG tablet, Take 1 tablet (12.5 mg total) by mouth 2 (two) times daily with a meal., Disp: 180 tablet, Rfl: 2 .  clopidogrel (PLAVIX) 75 MG tablet, Take 75 mg by mouth daily., Disp: , Rfl:  .  dicyclomine (BENTYL) 20 MG tablet, Take 20 mg by mouth 3 (three) times daily before meals., Disp: , Rfl:  .  DULoxetine (CYMBALTA) 60 MG capsule, Take 60 mg by mouth daily., Disp: , Rfl: 5 .  ferrous sulfate 325 (65 FE) MG tablet, Take 325 mg by mouth daily with breakfast., Disp: , Rfl:  .  gabapentin (NEURONTIN) 300 MG capsule, Take 1 capsule (300 mg  total) by mouth at bedtime., Disp: 90 capsule, Rfl: 1 .  glucose ([employer] GLUCOSE) 4 GM chewable tablet, Chew 1 tablet by mouth as needed for low blood sugar., Disp: , Rfl:  .  guaiFENesin (ROBITUSSIN) 100 MG/5ML SOLN, Take 5 mLs (100 mg total) by mouth every 4 (four) hours as needed for cough or to loosen phlegm., Disp: 236 mL, Rfl: 0 .  insulin aspart (NOVOLOG) 100 UNIT/ML injection, Inject 12-16 Units into the skin 3 (three) times daily with meals. Per sliding scale. 14 units taken last 11/09/15, Disp: , Rfl:  .  Insulin Degludec (TRESIBA FLEXTOUCH) 100 UNIT/ML SOPN, Inject 48 Units into the skin at bedtime., Disp: , Rfl:  .  ipratropium-albuterol (DUONEB) 0.5-2.5 (3) MG/3ML SOLN, Take 3 mLs by nebulization 2 (two) times daily as needed (for shortnes of breath). , Disp: , Rfl:  .  isosorbide mononitrate (IMDUR) 30 MG 24 hr tablet, Take 15 mg by mouth daily., Disp: , Rfl:  .  lansoprazole (PREVACID) 15 MG capsule, Take 15 mg by mouth 2 (two) times daily before a meal., Disp: , Rfl:  .  levothyroxine (SYNTHROID, LEVOTHROID) 50 MCG tablet, Take 50 mcg by mouth daily before breakfast. , Disp: , Rfl: 6 .  Multiple Vitamins-Minerals (WOMENS 50+ MULTI VITAMIN/MIN) TABS, Take 1 tablet by mouth daily., Disp: , Rfl:  .  nitroGLYCERIN (NITROSTAT) 0.4 MG SL tablet, Place 0.4 mg under the tongue every 5 (five) minutes as needed for chest pain (x 3 doses). For chest pain, Disp: , Rfl:  .  NOVOLOG FLEXPEN 100 UNIT/ML FlexPen, 70 -199 TAKE 12 UNITS, 200-299 TAKE 14 UNITS, 300 OR HIGHER 16 UNITS SUBCUTANEOUS, Disp: , Rfl: 4 .  OXYGEN-HELIUM IN, Inhale 4 L into the lungs at bedtime. , Disp: , Rfl:  .  prednisoLONE acetate (PRED FORTE) 1 % ophthalmic suspension, PLACE 1 DROP TO OPERATIVE EYE 4 TIMES DAILY (RIGHT EYE) START AFTER SURGERY, Disp: , Rfl: 0 .  Probiotic Product (PROBIOTIC PO), Take 1 tablet by mouth 2 (two) times daily., Disp: , Rfl:  .  simvastatin (ZOCOR) 40 MG tablet, Take 40 mg by mouth every evening. ,  Disp: , Rfl:  .  torsemide (DEMADEX) 20 MG tablet, Take 2 tablets (40 mg total) by mouth daily., Disp: 180 tablet, Rfl: 2 .  VIGAMOX 0.5 % ophthalmic solution, INSTILL 1 DROP 4X A DAY STARTING 2 DAYS PRIOR TO SURGERY AND 2 DROPS MORNING OF SURGERY RIGHT EYE, Disp: , Rfl: 0 .  [DISCONTINUED] dexlansoprazole (DEXILANT) 60 MG capsule, Take 60 mg by mouth daily., Disp: , Rfl:      Review of Systems     Objective:   Physical Exam  Filed Vitals:   03/31/16 1237  BP: 134/72  Pulse: 70  Height: 5\' 2"  (1.575 m)  Weight: 173 lb 6.4 oz (78.654 kg)  SpO2: 98%   Pleasant female AxOX3 Trach + O2 on via mask on trach+ [facility]k[place] through [facility] CTA bilaterally. Overall AE diminished Normal heart sounds Intact skin     Assessment:       ICD-9-CM ICD-10-CM   1. Other emphysema (HCC) 492.8 J43.8 AMB referral to pulmonary rehabilitation       Plan:      COPD/emphysema - cotninue duoneb , pulmicort and portable o2. REfer Pulmonary rehab on basis of emphysema  Trach care - by Dr Loma Sender - 12 weeks or sooner if needed   Dr. Kalman Shan, M.D., [facility].C.P Pulmonary and Critical Care Medicine Staff Physician [facility] Pager: [phone], If no answer or between  15:00h - 7:00h: call [phone]  03/31/2016 5:44 PM

## 2016-03-31 NOTE — Patient Instructions (Addendum)
ICD-9-CM ICD-10-CM   1. 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 REfer Pulmonary rehab on basis of emphysema  Trach care - by Dr Loma SenderBates  Followuop - 12 weeks or sooner if needed

## 2016-04-11 ENCOUNTER — Encounter: Payer: Self-pay | Admitting: Physician Assistant

## 2016-04-11 ENCOUNTER — Ambulatory Visit (INDEPENDENT_AMBULATORY_CARE_PROVIDER_SITE_OTHER): Payer: Medicare Other | Admitting: Physician Assistant

## 2016-04-11 VITALS — BP 100/42 | HR 72 | Ht 62.0 in | Wt 180.8 lb

## 2016-04-11 DIAGNOSIS — E785 Hyperlipidemia, unspecified: Secondary | ICD-10-CM

## 2016-04-11 DIAGNOSIS — I2510 Atherosclerotic heart disease of native coronary artery without angina pectoris: Secondary | ICD-10-CM | POA: Diagnosis not present

## 2016-04-11 DIAGNOSIS — I10 Essential (primary) hypertension: Secondary | ICD-10-CM

## 2016-04-11 DIAGNOSIS — N183 Chronic kidney disease, stage 3 unspecified: Secondary | ICD-10-CM

## 2016-04-11 DIAGNOSIS — R0602 Shortness of breath: Secondary | ICD-10-CM | POA: Diagnosis not present

## 2016-04-11 DIAGNOSIS — I5042 Chronic combined systolic (congestive) and diastolic (congestive) heart failure: Secondary | ICD-10-CM | POA: Diagnosis not present

## 2016-04-11 DIAGNOSIS — J449 Chronic obstructive pulmonary disease, unspecified: Secondary | ICD-10-CM

## 2016-04-11 DIAGNOSIS — I255 Ischemic cardiomyopathy: Secondary | ICD-10-CM | POA: Insufficient documentation

## 2016-04-11 LAB — BASIC METABOLIC PANEL WITH GFR
BUN: 43 mg/dL — ABNORMAL HIGH (ref 7–25)
CO2: 31 mmol/L (ref 20–31)
Calcium: 8.3 mg/dL — ABNORMAL LOW (ref 8.6–10.4)
Chloride: 103 mmol/L (ref 98–110)
Creat: 1.78 mg/dL — ABNORMAL HIGH (ref 0.60–0.93)
Glucose, Bld: 109 mg/dL — ABNORMAL HIGH (ref 65–99)
Potassium: 4.5 mmol/L (ref 3.5–5.3)
Sodium: 144 mmol/L (ref 135–146)

## 2016-04-11 MED ORDER — HYDRALAZINE HCL 25 MG PO TABS: 25.0000 mg | ORAL_TABLET | Freq: Three times a day (TID) | ORAL | Status: AC

## 2016-04-11 NOTE — Patient Instructions (Addendum)
Medication Instructions:  A REFILL FOR HYDRALAZINE 25 MG 1 TABLET THREE TIMES DAILY Labwork: TODAY BMET, BNP Testing/Procedures: NONE Follow-Up: DR. Anne FuSKAINS 07/19/16 @ 2:15 Any Other Special Instructions Will Be Listed Below (If Applicable). If you need a refill on your cardiac medications before your next appointment, please call your pharmacy.

## 2016-04-11 NOTE — Progress Notes (Signed)
Cardiology Office Note:    Date:  04/11/2016   ID:  [name], DOB January 24, 1946, MRN [id number]  PCP:  Cala Bradford, MD  Cardiologist:  Dr. Donato Schultz   Electrophysiologist:  n/a  Referring MD: Laurann Montana, MD   Chief Complaint  Patient presents with  . Congestive Heart Failure    follow up    History of Present Illness:     [name] is a 70 y.o. female with a hx of CAD status post CABG, ischemic cardiomyopathy, chronic systolic CHF, COPD, diabetes, PAD, HL, CKD stage III, HTN. EF has been as low as 20% in the past. Most recent echo in 3/16 demonstrated EF 50%. LHC in 3/16 demonstrated patent left main, occluded LAD and RCA, nonobstructive disease in the LCx, occluded SVG-RCA, patent SVG to the LAD with 70% stenosis beyond graft. There were no good target for PCI and medical therapy was recommended. She is on chronic O2 secondary to COPD and systolic HF. She is status post left LE bypass by Dr. Darrick Penna. 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   Admitted in 10/16 with a/c hypoxic respiratory failure in the setting of a/c combined systolic and diastolic HF and tracheobronchitis.   Admitted in 1/17 with acute on chronic combined systolic and diastolic HF.  Last seen by Dr. Anne Fu 4/17. After DC from the hospital in 1/17, her creatinine increased and her torsemide dose was reduced. She was having more symptoms of dyspnea when last seen and her torsemide was increased back to 40 mg daily. She returns for follow-up.  She is here alone today. Overall, she feels that her breathing is stable. She denies PND. She sleeps on 2 pillows. Denies chest pain. Denies syncope. Weights at home have been stable.   Past Medical History  Diagnosis Date  . Diabetes mellitus     on Lantus 30 U   . CAD (coronary artery disease)     a. S/p CABG and stenting // b. Myoview 3/16: high risk EF 25%, ant-apical AK, ant scar with peri-infarct ischemia // c. LHC 3/16 - EF  45%, inferior hypokinesis, oLAD 100, oLCx 40, branch vessel 50, pRCA 100 dRCA , RV br 90, S-RCA occluded, S-LAD patent with inf-apical LAD beyond graft 70 >> med Rx  . PAD (peripheral artery disease) (HCC)   . Stroke [facility])     MRI 11/2011 with remote occipital lobe. MRA with moderate left focal vertebral artery stenosis  . Chronic combined systolic and diastolic CHF (congestive heart failure) (HCC)     a. 11/2011 Echo with EF 30-35%, global hypokinesis, and inferior akinesis;  b. 12/2014 Echo: EF 50%, mod LVH, Ao sclerosis w/o stenosis, mildly dil LA, mild to mod RV dysfxn. // c. Echo 10/16: Moderate LVH, EF 35-40%, inferior AK, grade 1 diastolic dysfunction, MAC, mild MR, moderate LAE  . Hyperlipidemia   . DDD (degenerative disc disease), lumbar   . MI (myocardial infarction) (HCC) 1997  . COPD (chronic obstructive pulmonary disease) (HCC)     a. prn and HS supplemental O2.  . Anemia   . Hypertension   . GERD (gastroesophageal reflux disease)   . History of IBS   . Hypothyroidism     Goiter  . Carotid artery disease (HCC)     a. s/p L CEA // b. Carotid US 4/16: R 40-59%; L CEA patent with 40-59%  . CKD (chronic kidney disease), stage III     stage 3  . History  of tracheostomy (HCC) 08/26/2013    Dr. Jenne PaneBates    Past Surgical History  Procedure Laterality Date  . Ptca    . Thyroidectomy    . Coronary artery bypass graft      2 vessel  . Carotid endarterectomy  ~2008    Left   . Cholecystectomy    . Tracheostomy tube placement  01/02/2012  . Angioplasty  [id number]    Aortogram by Dr. [place]had McKenzie [facility])  . Pr vein bypass graft,aorto-fem-pop      Right common femoral-AK popliteal BPG & Right Popliteal-posterior tibial  . Pr vein bypass graft,aorto-fem-pop      Left Fem-pop BPG  . Carpal tunnel release Right   . Femoral-tibial bypass graft Left 06/11/2013    Procedure: BYPASS GRAFT  LEFT FEMORAL- POSTERIOR TIBIAL ARTERY/ REDO;  Surgeon: Sherren Kernsharles E Fields, MD;  [facility];   Service: Vascular;  Laterality: Left;  . Thrombectomy femoral artery Left 06/11/2013    Procedure: THROMBECTOMY FEMORAL ARTERY;  Surgeon: Sherren Kernsharles E Fields, MD;  [facility];  Service: Vascular;  Laterality: Left;  . Spine surgery  Oct. 27, 2014    Injection - Back  . Abdominal aortagram N/A 04/05/2013    Procedure: ABDOMINAL Ronny FlurryAORTAGRAM;  Surgeon: Sherren Kernsharles E Fields, MD;  [facility];  Service: Cardiovascular;  Laterality: N/A;  . Colonoscopy N/A 10/27/2014    Procedure: COLONOSCOPY;  Surgeon: Willis ModenaWilliam Outlaw, MD;  [facility];  Service: Endoscopy;  Laterality: N/A;  . Left and right heart catheterization with coronary angiogram N/A 01/09/2015    Procedure: LEFT AND RIGHT HEART CATHETERIZATION WITH CORONARY ANGIOGRAM;  Surgeon: Marykay Lexavid W Harding, MD;  [facility];  Service: Cardiovascular;  Laterality: N/A;    Current Medications: Outpatient Prescriptions Prior to Visit  Medication Sig Dispense Refill  . aspirin EC 81 MG tablet Take 81 mg by mouth daily.    . carvedilol (COREG) 12.5 MG tablet Take 1 tablet (12.5 mg total) by mouth 2 (two) times daily with a meal. 180 tablet 2  . clopidogrel (PLAVIX) 75 MG tablet Take 75 mg by mouth daily.    Marland Kitchen. dicyclomine (BENTYL) 20 MG tablet Take 20 mg by mouth 3 (three) times daily before meals.    . DULoxetine (CYMBALTA) 60 MG capsule Take 60 mg by mouth daily.  5  . ferrous sulfate 325 (65 FE) MG tablet Take 325 mg by mouth daily with breakfast.    . gabapentin (NEURONTIN) 300 MG capsule Take 1 capsule (300 mg total) by mouth at bedtime. 90 capsule 1  . glucose ([employer] GLUCOSE) 4 GM chewable tablet Chew 1 tablet by mouth as needed for low blood sugar.    Marland Kitchen. guaiFENesin (ROBITUSSIN) 100 MG/5ML SOLN Take 5 mLs (100 mg total) by mouth every 4 (four) hours as needed for cough or to loosen phlegm. 236 mL 0  . insulin aspart (NOVOLOG) 100 UNIT/ML injection Inject 12-16 Units into the skin 3 (three) times daily with meals. Per sliding  scale. 14 units taken last 11/09/15    . Insulin Degludec (TRESIBA FLEXTOUCH) 100 UNIT/ML SOPN Inject 48 Units into the skin at bedtime.    Marland Kitchen. ipratropium-albuterol (DUONEB) 0.5-2.5 (3) MG/3ML SOLN Take 3 mLs by nebulization 2 (two) times daily as needed (for shortnes of breath).     . isosorbide mononitrate (IMDUR) 30 MG 24 hr tablet Take 15 mg by mouth daily.    . lansoprazole (PREVACID) 15 MG capsule Take 15 mg by mouth 2 (  two) times daily before a meal.    . levothyroxine (SYNTHROID, LEVOTHROID) 50 MCG tablet Take 50 mcg by mouth daily before breakfast.   6  . Multiple Vitamins-Minerals (WOMENS 50+ MULTI VITAMIN/MIN) TABS Take 1 tablet by mouth daily.    . nitroGLYCERIN (NITROSTAT) 0.4 MG SL tablet Place 0.4 mg under the tongue every 5 (five) minutes as needed for chest pain (x 3 doses). For chest pain    . NOVOLOG FLEXPEN 100 UNIT/ML FlexPen 70 -199 TAKE 12 UNITS, 200-299 TAKE 14 UNITS, 300 OR HIGHER 16 UNITS SUBCUTANEOUS  4  . OXYGEN-HELIUM IN Inhale 4 L into the lungs at bedtime.     . prednisoLONE acetate (PRED FORTE) 1 % ophthalmic suspension PLACE 1 DROP TO OPERATIVE EYE 4 TIMES DAILY (RIGHT EYE) START AFTER SURGERY  0  . Probiotic Product (PROBIOTIC PO) Take 1 tablet by mouth 2 (two) times daily.    . simvastatin (ZOCOR) 40 MG tablet Take 40 mg by mouth every evening.     . torsemide (DEMADEX) 20 MG tablet Take 2 tablets (40 mg total) by mouth daily. 180 tablet 2  . VIGAMOX 0.5 % ophthalmic solution Reported on 04/11/2016  0   No facility-administered medications prior to visit.      Allergies:   Aldactone; Lisinopril; Crestor; and Vicodin   Social History   Social History  . Marital Status: Single    Spouse Name: N/A  . Number of Children: 3  . Years of Education: N/A   Occupational History  . Retired    Social History Main Topics  . Smoking status: Former Smoker -- 1.00 packs/day for 50 years    Types: Cigarettes    Quit date: 01/24/2012  . Smokeless tobacco: Never Used    . Alcohol Use: No  . Drug Use: No  . Sexual Activity: No   Other Topics Concern  . None   Social History Narrative   Retired Advertising account executive.  Worked at the [employer] on [employer].  Formerly lived in [place] area.  2 daughters, Living here with her daughter in [place] starting end of January 2013. [name] [phone] [id number]. Ambulatory previously with a cane, but none now.     Family History:  The patient's family history includes Alzheimer's disease in her mother; Diabetes in her daughter; Heart disease in her mother; Hyperlipidemia in her mother; Hypertension in her daughter; Irregular heart beat in her mother; Other in her mother.   ROS:   Please see the history of present illness.    ROS All other systems reviewed and are negative.   Physical Exam:    VS:  BP 100/42 mmHg  Pulse 72  Ht 5\' 2"  (1.575 m)  Wt 180 lb 12.8 oz (82.01 kg)  BMI 33.06 kg/m2  SpO2 85%   Physical Exam  Constitutional: She appears well-developed and well-nourished.  HENT:  Head: Normocephalic.  Neck:  Trach collar in place  Cardiovascular: Normal rate, regular rhythm and normal heart sounds.   No murmur heard. Pulmonary/Chest:  Decreased breath sounds bilaterally, no wheezing or rales  Abdominal: Soft. There is no tenderness.  Musculoskeletal:  Trace-1+ LLE edema, trace RLE edema  Skin: Skin is warm and dry.  Psychiatric: She has a normal mood and affect.    Wt Readings from Last 3 Encounters:  04/11/16 180 lb 12.8 oz (82.01 kg)  03/31/16 173 lb 6.4 oz (78.654 kg)  02/08/16 176 lb 9.6 oz (80.105 kg)      Studies/Labs Reviewed:  EKG:  EKG is not ordered today.  The ekg ordered today demonstrates n/a  Recent Labs: 11/16/2015: ALT 24 11/17/2015: B Natriuretic Peptide 982.1* 11/18/2015: Hemoglobin 9.8*; Magnesium 2.2; Platelets 164 02/15/2016: BUN 36*; Creat 1.35*; Potassium 4.4; Sodium 145   Recent Lipid Panel    Component Value Date/Time   CHOL 134 01/07/2015 0543    TRIG 122 01/07/2015 0543   HDL 36* 01/07/2015 0543   CHOLHDL 3.7 01/07/2015 0543   VLDL 24 01/07/2015 0543   LDLCALC 74 01/07/2015 0543    Additional studies/ records that were reviewed today include:   Echo 07/28/15 Moderate LVH, EF 35-40%, inferior AK, grade 1 diastolic dysfunction, MAC, mild MR, moderate LAE  Carotid US 4/16 R 40-59%; L CEA patent with 40-59%  Myoview 3/16 Final Impression: High risk nuclear stress test. LVEF 25%, dilated LV with severe global hypokinesis and anteroapical akinesis. There is a moderate-sized and intensity, partially reversible anterior perfusion defect which could represent scar with peri-infarct ischemia or significant hybernating myocardium.  LHC 3/16 EF 45%, inf HK LM: Ok LAD: Ostial 100% LCx: Ostial 40%, branch vessel 50% (not amenable to PCI) RCA: prox 100% after RV, 90% prior to RV branch, RV branch with 90% S-RCA: Chronically occluded S-mLAD: Patent, inferoapical LAD beyond graft 70% POST-OPERATIVE DIAGNOSIS:   Severe Native & graft CAD with 100% RCA, LAD & small AV Groove Cx along with SVG-RCA  Widely patent SVG-distal Mid Wraparound LAD with severe diffuse proximal LAD disease limiting retrograde flow.  Widely patent native Ramus vs. Large Lateral OM with 2 large bifurcating branches that have diffuse moderate disease.  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  Echo 3/16 Inf-lat HK, inf HK, Mod LVH, EF 50%, Ao sclerosis, mild LAE, mild to mod reduced RVSF, mild RAE, PASP 35 mmHg   ASSESSMENT:     1. Chronic combined systolic and diastolic CHF (congestive  heart failure) (HCC)   2. Shortness of breath   3. Ischemic cardiomyopathy   4. Coronary artery disease involving native coronary artery of native heart without angina pectoris   5. Essential hypertension   6. Hyperlipidemia   7. Chronic kidney disease, stage 3   8. Chronic obstructive pulmonary disease, unspecified COPD type (HCC)     PLAN:     In order of problems listed above:  1. Chronic Combined Systolic and Diastolic CHF: Weights at home have been stable. She is NYHA 2b-3. Overall, her breathing is stable. Continue current medical regimen. Obtain follow-up BMET, BNP today.   2. Ischemic Cardiomyopathy: Recent EF 35-40%. Continue beta-blocker, hydralazine, nitrates. ARB has been DC'd due to worsening renal function.   3. CAD S/P CABG: Status post myocardial infarction in 1997 and subsequent CABG. Cardiac catheterization 3/16 with severe native three-vessel disease, occluded SVG-RCA and patent SVG-LAD. Anatomy was felt to be stable and medical therapy was continued. She denies anginal symptoms. Continue ASA, Plavix, statin, beta-blocker.   4. HTN: Blood pressure controlled. Continue medical regimen.  5. Hyperlipidemia: Continue statin. LDL in 3/16 was 74.  6. CKD - Recent creatinine stable. Check repeat BMET today.  7. COPD:  O2 low today.  We rechecked her sats  on O2 and it was 91%.   Medication Adjustments/Labs and Tests Ordered: Current medicines are reviewed at length with the patient today.  Concerns regarding medicines are outlined above.  Medication changes, Labs and Tests ordered today are outlined in the Patient Instructions noted below. Patient Instructions  Medication Instructions:  A REFILL FOR HYDRALAZINE 25 MG 1 TABLET THREE TIMES DAILY Labwork: TODAY BMET, BNP Testing/Procedures: NONE Follow-Up: DR. Anne Fu 07/19/16 @ 2:15 Any Other Special Instructions Will Be Listed Below (If Applicable). If you need a refill on your cardiac medications before  your next appointment, please call your pharmacy.    Signed, Tereso Newcomer, PA-C  04/11/2016 5:10 PM    [facility] [address] [place], [place], [place]  [address] Phone: [phone]; Fax: [phone]

## 2016-04-12 ENCOUNTER — Telehealth: Payer: Self-pay | Admitting: *Deleted

## 2016-04-12 DIAGNOSIS — I5022 Chronic systolic (congestive) heart failure: Secondary | ICD-10-CM

## 2016-04-12 LAB — BRAIN NATRIURETIC PEPTIDE: Brain Natriuretic Peptide: 623.6 pg/mL — ABNORMAL HIGH (ref <100)

## 2016-04-12 MED ORDER — TORSEMIDE 20 MG PO TABS: 40.0000 mg | ORAL_TABLET | ORAL | Status: DC

## 2016-04-12 NOTE — Telephone Encounter (Signed)
Pt notified of lab results and findings by phone and is agreeable to plan of care to increase torsemide to 60 mg every Wed and Fri, with 40 mg all other days. bmet, bnp 6/27

## 2016-04-18 ENCOUNTER — Ambulatory Visit: Payer: Medicare Other

## 2016-04-19 ENCOUNTER — Other Ambulatory Visit: Payer: Medicare Other

## 2016-05-05 ENCOUNTER — Telehealth: Payer: Self-pay | Admitting: Cardiology

## 2016-05-05 ENCOUNTER — Telehealth: Payer: Self-pay | Admitting: Internal Medicine

## 2016-05-05 NOTE — Telephone Encounter (Signed)
Called and spoke with tammy at [facility] and she is aware that the order was sent to [facility] and she will call over and find out the status of this.

## 2016-05-05 NOTE — Telephone Encounter (Signed)
New message      FYI Pt was seen by nurse today.  Her wt has been up---gained 3lbs over the last 7 days, increased edema, dyspnea, abd swelling.  Doctor ordered extra diuretics over the next few days. Pt did not keep her most recent lab appt.  Nurse will see her on Monday and get labs.  She will send results to us.  If you need more info, please call

## 2016-05-10 ENCOUNTER — Telehealth: Payer: Self-pay | Admitting: *Deleted

## 2016-05-10 ENCOUNTER — Telehealth: Payer: Self-pay | Admitting: Cardiology

## 2016-05-10 ENCOUNTER — Encounter: Payer: Self-pay | Admitting: Physician Assistant

## 2016-05-10 DIAGNOSIS — I5022 Chronic systolic (congestive) heart failure: Secondary | ICD-10-CM

## 2016-05-10 MED ORDER — TORSEMIDE 20 MG PO TABS: 60.0000 mg | ORAL_TABLET | Freq: Every day | ORAL | Status: DC

## 2016-05-10 NOTE — Telephone Encounter (Signed)
New message      Calling to give lab results and update on visit from yesterday

## 2016-05-10 NOTE — Telephone Encounter (Signed)
Pt has been notified of lab results. Pt states weight has been going down. I then saw there was a phone note from earlier today which I d/w PA Tereso NewcomerScott Weaver, .PA advised to increase Torsemide to 60 mg daily, bmet, bnp 1 week w/HHRN. [facility]

## 2016-05-10 NOTE — Telephone Encounter (Signed)
Increase Torsemide to 60 mg Once daily  BMET, BNP 1 week Tereso NewcomerScott Melessa Cowell, PA-C   05/10/2016 5:20 PM

## 2016-05-10 NOTE — Telephone Encounter (Signed)
S/w Tammy @ [facility] [phone] who [facility]'s Toresemide to 60 mg daily with BMET, BNP to be done in 1 week. Tammy verbalized understanding to plan of care for the pt. [facility] [phone]

## 2016-05-10 NOTE — Telephone Encounter (Signed)
Spoke with [facility]my at [facility].  She wanted to update Dr. Anne FuSkains on how pt was doing from last Thursday. Pt normally takes Torsemide 40 QD except on Wed and Fri takes 60mg . Due to the edema pt was having last week, on Thursday pt was given 80mg  of Torsemide and on Friday, Saturday and Sunday pt was given 60mg  of Torsemide. Pt returned to normal dosing Monday, 7/17. Weight Thurs was 173, Mon 172, today 171. Tammy states pt looks much better, edema resolved.  DOE much improved. Labs were obtained and Tammy will fax those over.  BUN 51, Crea 177, BNP 801.3. Tammy would like to know if Dr. Anne FuSkains wants to adjust pt's daily regimen or Torsemide at all?  Tammy states she is also able to obtain labs if needed. Will forward to Dr Anne FuSkains for review and advisement.

## 2016-05-12 ENCOUNTER — Other Ambulatory Visit: Payer: Self-pay | Admitting: Sports Medicine

## 2016-05-12 DIAGNOSIS — M48061 Spinal stenosis, lumbar region without neurogenic claudication: Secondary | ICD-10-CM

## 2016-05-12 DIAGNOSIS — M545 Low back pain, unspecified: Secondary | ICD-10-CM

## 2016-05-12 DIAGNOSIS — G8929 Other chronic pain: Secondary | ICD-10-CM

## 2016-05-18 NOTE — Telephone Encounter (Signed)
Please see lab report for further documentation

## 2016-05-20 ENCOUNTER — Encounter: Payer: Self-pay | Admitting: Physician Assistant

## 2016-05-20 ENCOUNTER — Telehealth: Payer: Self-pay | Admitting: *Deleted

## 2016-05-20 DIAGNOSIS — I5022 Chronic systolic (congestive) heart failure: Secondary | ICD-10-CM

## 2016-05-20 MED ORDER — TORSEMIDE 20 MG PO TABS: 80.0000 mg | ORAL_TABLET | ORAL | Status: DC

## 2016-05-20 NOTE — Telephone Encounter (Signed)
Pt has been notified of lab results and findings. Pt agreeable to increase Torsemide to 80 mg every mon, wed and fri's. with continuing 60 mg all other days of the week. BMET 8/4. Pt verbalized understanding to plan of care.

## 2016-05-27 ENCOUNTER — Other Ambulatory Visit: Payer: Medicare Other | Admitting: *Deleted

## 2016-05-27 DIAGNOSIS — I5022 Chronic systolic (congestive) heart failure: Secondary | ICD-10-CM

## 2016-05-27 LAB — BASIC METABOLIC PANEL
BUN: 46 mg/dL — ABNORMAL HIGH (ref 7–25)
CALCIUM: 8.5 mg/dL — AB (ref 8.6–10.4)
CHLORIDE: 100 mmol/L (ref 98–110)
CO2: 37 mmol/L — AB (ref 20–31)
CREATININE: 1.59 mg/dL — AB (ref 0.60–0.93)
Glucose, Bld: 157 mg/dL — ABNORMAL HIGH (ref 65–99)
Potassium: 4.3 mmol/L (ref 3.5–5.3)
SODIUM: 146 mmol/L (ref 135–146)

## 2016-05-27 LAB — BRAIN NATRIURETIC PEPTIDE: Brain Natriuretic Peptide: 904.6 pg/mL — ABNORMAL HIGH (ref <100)

## 2016-05-27 NOTE — Addendum Note (Signed)
Addended by: Arcola Jansky on: 05/27/2016 11:10 AM   Modules accepted: Orders

## 2016-05-27 NOTE — Addendum Note (Signed)
Addended by: Tonita Phoenix on: 05/27/2016 10:45 AM   Modules accepted: Orders

## 2016-06-06 ENCOUNTER — Ambulatory Visit
Admission: RE | Admit: 2016-06-06 | Discharge: 2016-06-06 | Disposition: A | Discharge status: Home / Self Care | Payer: Medicare Other | Source: Ambulatory Visit | Attending: Sports Medicine | Admitting: Sports Medicine

## 2016-06-06 DIAGNOSIS — M545 Low back pain, unspecified: Secondary | ICD-10-CM

## 2016-06-06 DIAGNOSIS — M48061 Spinal stenosis, lumbar region without neurogenic claudication: Secondary | ICD-10-CM

## 2016-06-06 DIAGNOSIS — G8929 Other chronic pain: Secondary | ICD-10-CM

## 2016-06-06 MED ORDER — IOPAMIDOL (ISOVUE-M 200) INJECTION 41%
1.0000 mL | Freq: Once | INTRAMUSCULAR | Status: AC
  Administered 2016-06-06: 1 mL via EPIDURAL

## 2016-06-06 MED ORDER — METHYLPREDNISOLONE ACETATE 40 MG/ML INJ SUSP (RADIOLOG
120.0000 mg | Freq: Once | INTRAMUSCULAR | Status: AC
  Administered 2016-06-06: 120 mg via EPIDURAL

## 2016-06-06 NOTE — Discharge Instructions (Addendum)
Post Procedure Spinal Discharge Instruction Sheet  1. You may resume a regular diet and any medications that you routinely take (including pain medications).  2. No driving day of procedure.  3. Light activity throughout the rest of the day.  Do not do any strenuous work, exercise, bending or lifting.  The day following the procedure, you can resume normal physical activity but you should refrain from exercising or physical therapy for at least three days thereafter.   Common Side Effects:   Headaches- take your usual medications as directed by your physician.  Increase your fluid intake.  Caffeinated beverages may be helpful.  Lie flat in bed until your headache resolves.   Restlessness or inability to sleep- you may have trouble sleeping for the next few days.  Ask your referring physician if you need any medication for sleep.   Facial flushing or redness- should subside within a few days.   Increased pain- a temporary increase in pain a day or two following your procedure is not unusual.  Take your pain medication as prescribed by your referring physician.   Leg cramps  Please contact our office at [phone] for the following symptoms:  Fever greater than 100 degrees.  Headaches unresolved with medication after 2-3 days.  Increased swelling, pain, or redness at injection site.  Thank you for visiting our office.   May resume PLAVIX today.

## 2016-06-12 ENCOUNTER — Encounter (HOSPITAL_COMMUNITY): Payer: Self-pay | Admitting: Emergency Medicine

## 2016-06-12 ENCOUNTER — Emergency Department (HOSPITAL_COMMUNITY): Payer: Medicare Other

## 2016-06-12 ENCOUNTER — Emergency Department (HOSPITAL_COMMUNITY)
Admission: EM | Admit: 2016-06-12 | Discharge: 2016-06-12 | Disposition: A | Discharge status: Home / Self Care | Payer: Medicare Other | Attending: Emergency Medicine | Admitting: Emergency Medicine

## 2016-06-12 DIAGNOSIS — M25562 Pain in left knee: Secondary | ICD-10-CM

## 2016-06-12 DIAGNOSIS — W010XXA Fall on same level from slipping, tripping and stumbling without subsequent striking against object, initial encounter: Secondary | ICD-10-CM | POA: Diagnosis not present

## 2016-06-12 DIAGNOSIS — I2510 Atherosclerotic heart disease of native coronary artery without angina pectoris: Secondary | ICD-10-CM | POA: Insufficient documentation

## 2016-06-12 DIAGNOSIS — J449 Chronic obstructive pulmonary disease, unspecified: Secondary | ICD-10-CM | POA: Diagnosis not present

## 2016-06-12 DIAGNOSIS — N183 Chronic kidney disease, stage 3 (moderate): Secondary | ICD-10-CM | POA: Diagnosis not present

## 2016-06-12 DIAGNOSIS — S8002XA Contusion of left knee, initial encounter: Secondary | ICD-10-CM | POA: Diagnosis not present

## 2016-06-12 DIAGNOSIS — W19XXXA Unspecified fall, initial encounter: Secondary | ICD-10-CM

## 2016-06-12 DIAGNOSIS — I5042 Chronic combined systolic (congestive) and diastolic (congestive) heart failure: Secondary | ICD-10-CM | POA: Insufficient documentation

## 2016-06-12 DIAGNOSIS — Z794 Long term (current) use of insulin: Secondary | ICD-10-CM | POA: Diagnosis not present

## 2016-06-12 DIAGNOSIS — Z951 Presence of aortocoronary bypass graft: Secondary | ICD-10-CM | POA: Diagnosis not present

## 2016-06-12 DIAGNOSIS — Z8673 Personal history of transient ischemic attack (TIA), and cerebral infarction without residual deficits: Secondary | ICD-10-CM | POA: Diagnosis not present

## 2016-06-12 DIAGNOSIS — R58 Hemorrhage, not elsewhere classified: Secondary | ICD-10-CM | POA: Insufficient documentation

## 2016-06-12 DIAGNOSIS — Z87891 Personal history of nicotine dependence: Secondary | ICD-10-CM | POA: Insufficient documentation

## 2016-06-12 DIAGNOSIS — I130 Hypertensive heart and chronic kidney disease with heart failure and stage 1 through stage 4 chronic kidney disease, or unspecified chronic kidney disease: Secondary | ICD-10-CM | POA: Insufficient documentation

## 2016-06-12 DIAGNOSIS — Y929 Unspecified place or not applicable: Secondary | ICD-10-CM | POA: Diagnosis not present

## 2016-06-12 DIAGNOSIS — S299XXA Unspecified injury of thorax, initial encounter: Secondary | ICD-10-CM | POA: Diagnosis present

## 2016-06-12 DIAGNOSIS — S2002XA Contusion of left breast, initial encounter: Secondary | ICD-10-CM

## 2016-06-12 DIAGNOSIS — E039 Hypothyroidism, unspecified: Secondary | ICD-10-CM | POA: Insufficient documentation

## 2016-06-12 DIAGNOSIS — Z7982 Long term (current) use of aspirin: Secondary | ICD-10-CM | POA: Insufficient documentation

## 2016-06-12 DIAGNOSIS — E1122 Type 2 diabetes mellitus with diabetic chronic kidney disease: Secondary | ICD-10-CM | POA: Insufficient documentation

## 2016-06-12 DIAGNOSIS — Z79899 Other long term (current) drug therapy: Secondary | ICD-10-CM | POA: Diagnosis not present

## 2016-06-12 DIAGNOSIS — Y939 Activity, unspecified: Secondary | ICD-10-CM | POA: Diagnosis not present

## 2016-06-12 DIAGNOSIS — Y999 Unspecified external cause status: Secondary | ICD-10-CM | POA: Insufficient documentation

## 2016-06-12 NOTE — ED Provider Notes (Signed)
[facility] Provider Note   CSN: [id number] Arrival date & time: 06/12/16  1322     History   Chief Complaint Chief Complaint  Patient presents with  . Fall    HPI [name] is a 70 y.o. female.  Status post fall while entering the [employer] bus on Wednesday. No head or neck trauma. Complains of bruising to the left breast and pain in the left knee. She is ambulatory. Severity of pain is mild to moderate. No other injuries.      Past Medical History:  Diagnosis Date  . Anemia   . CAD (coronary artery disease)    a. S/p CABG and stenting // b. Myoview 3/16: high risk EF 25%, ant-apical AK, ant scar with peri-infarct ischemia // c. LHC 3/16 - EF 45%, inferior hypokinesis, oLAD 100, oLCx 40, branch vessel 50, pRCA 100 dRCA , RV br 90, S-RCA occluded, S-LAD patent with inf-apical LAD beyond graft 70 >> med Rx  . Carotid artery disease (HCC)    a. s/p L CEA // b. Carotid US 4/16: R 40-59%; L CEA patent with 40-59%  . Chronic combined systolic and diastolic CHF (congestive heart failure) (HCC)    a. 11/2011 Echo with EF 30-35%, global hypokinesis, and inferior akinesis;  b. 12/2014 Echo: EF 50%, mod LVH, Ao sclerosis w/o stenosis, mildly dil LA, mild to mod RV dysfxn. // c. Echo 10/16: Moderate LVH, EF 35-40%, inferior AK, grade 1 diastolic dysfunction, MAC, mild MR, moderate LAE  . CKD (chronic kidney disease), stage III    stage 3  . COPD (chronic obstructive pulmonary disease) (HCC)    a. prn and HS supplemental O2.  . DDD (degenerative disc disease), lumbar   . Diabetes mellitus    on Lantus 30 U   . GERD (gastroesophageal reflux disease)   . History of IBS   . History of tracheostomy (HCC) 08/26/2013   Dr. Jenne Pane  . Hyperlipidemia   . Hypertension   . Hypothyroidism    Goiter  . MI (myocardial infarction) (HCC) 1997  . PAD (peripheral artery disease) (HCC)   . Stroke [facility])    MRI 11/2011 with remote occipital lobe. MRA with moderate left focal vertebral artery  stenosis    Patient Active Problem List   Diagnosis Date Noted  . Ischemic cardiomyopathy 04/11/2016  . AV block, 1st degree 11/15/2015  . Respiratory failure (HCC) 11/10/2015  . Chronic kidney disease, stage 3 11/10/2015  . Cancer screening 11/04/2015  . COPD, severe (HCC) 11/04/2015  . Chronic respiratory failure with hypoxia (HCC) 11/04/2015  . Status post tracheostomy (HCC) 11/04/2015  . Tracheitis   . Acute on chronic respiratory failure with hypoxia (HCC) 07/28/2015  . DM type 2 causing CKD stage 3 (HCC) 07/28/2015  . Dyspnea 07/27/2015  . Abnormal EKG 07/27/2015  . Acute respiratory failure with hypoxia (HCC) 07/27/2015  . Chronic combined systolic and diastolic CHF (congestive heart failure) (HCC) 07/27/2015  . Acute pulmonary edema (HCC)   . Pain in the chest   . Abnormal nuclear stress test   . Lower GI bleed 10/22/2014  . GI bleeding 10/22/2014  . Chronic systolic heart failure (HCC) 07/17/2014  . Chronic airway obstruction, not elsewhere classified 07/17/2014  . Shortness of breath 07/17/2014  . Wound drainage-Left medial lower leg 03/06/2014  . Atherosclerosis of native arteries of the extremities with intermittent claudication 01/16/2014  . Aftercare following surgery of the circulatory system, NEC 09/05/2013  . History of tracheostomy (HCC) 08/26/2013  .  Encounter for staple removal 07/18/2013  . Numbness in left leg 03/28/2013  . Discoloration of skin- left foot 03/28/2013  . Pain in limb- left leg and foot 03/28/2013  . Peripheral vascular disease, unspecified (HCC) 03/28/2013  . Nausea vomiting and diarrhea 01/01/2013  . Dehydration 01/01/2013  . Nonspecific abnormal electrocardiogram (ECG) (EKG)/QT prolongation 01/01/2013  . Anemia 10/21/2012  . CAP (community acquired pneumonia) 10/18/2012  . Occlusion and stenosis of carotid artery without mention of cerebral infarction 02/02/2012  . 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 Hyperkalemia  12/15/2011  . Acute renal failure (HCC) 12/15/2011  . SOB (shortness of breath) 12/01/2011  . Diabetes mellitus type 2, uncontrolled (HCC) 11/26/2011  . Hypertension 11/26/2011  . Hyperlipidemia 11/26/2011  . CVA (cerebral infarction) 11/26/2011  . COPD (chronic obstructive pulmonary disease) (HCC) 11/26/2011  . H/O hyperthyroidism s/p thyroidectomy 11/26/2011  . Peripheral neuropathy (HCC) 11/26/2011  . History of MI (myocardial infarction)   . CAD (coronary artery disease)   . Chronic systolic congestive heart failure, NYHA class 3/EF 20-25% (ECHO 10/2012)   . S/P CABG (coronary artery bypass graft)   . PAD (peripheral artery disease) (HCC)   . Hx-TIA (transient ischemic attack) 01/24/2011    Past Surgical History:  Procedure Laterality Date  . ABDOMINAL AORTAGRAM N/A 04/05/2013   Procedure: ABDOMINAL Ronny FlurryAORTAGRAM;  Surgeon: Sherren Kernsharles E Fields, MD;  [facility];  Service: Cardiovascular;  Laterality: N/A;  . [id number]  [id number]  0815-2012   Aortogram by Dr. Italyhad McKenzie [facility]  . CAROTID ENDARTERECTOMY  ~2008   Left   . CARPAL TUNNEL RELEASE Right   . CHOLECYSTECTOMY    . COLONOSCOPY N/A 10/27/2014   Procedure: COLONOSCOPY;  Surgeon: Willis ModenaWilliam Outlaw, MD;  [facility];  Service: Endoscopy;  Laterality: N/A;  . CORONARY ARTERY BYPASS GRAFT     2 vessel  . FEMORAL-TIBIAL BYPASS GRAFT Left 06/11/2013   Procedure: BYPASS GRAFT  LEFT FEMORAL- POSTERIOR TIBIAL ARTERY/ REDO;  Surgeon: Sherren Kernsharles E Fields, MD;  [facility];  Service: Vascular;  Laterality: Left;  . LEFT AND RIGHT HEART CATHETERIZATION WITH CORONARY ANGIOGRAM N/A 01/09/2015   Procedure: LEFT AND RIGHT HEART CATHETERIZATION WITH CORONARY ANGIOGRAM;  Surgeon: Marykay Lexavid W Harding, MD;  [facility];  Service: Cardiovascular;  Laterality: N/A;  . PR VEIN BYPASS GRAFT,AORTO-FEM-POP     Right common femoral-AK popliteal BPG & Right Popliteal-posterior tibial  . PR VEIN BYPASS GRAFT,AORTO-FEM-POP     Left  Fem-pop BPG  . PTCA    . SPINE SURGERY  Oct. 27, 2014   Injection - Back  . THROMBECTOMY FEMORAL ARTERY Left 06/11/2013   Procedure: THROMBECTOMY FEMORAL ARTERY;  Surgeon: Sherren Kernsharles E Fields, MD;  [facility];  Service: Vascular;  Laterality: Left;  . THYROIDECTOMY    . TRACHEOSTOMY TUBE PLACEMENT  01/02/2012    OB History    No data available       Home Medications    Prior to Admission medications   Medication Sig Start Date End Date Taking? Authorizing Provider  acetaminophen (TYLENOL) 500 MG tablet Take 1,000 mg by mouth daily as needed for mild pain or headache.    Yes Historical Provider, MD  aspirin EC 81 MG tablet Take 81 mg by mouth daily.   Yes Historical Provider, MD  carvedilol (COREG) 12.5 MG tablet Take 1 tablet (12.5 mg total) by mouth 2 (two) times daily with a meal. 03/29/16  Yes Jake BatheMark C Skains, MD  clopidogrel (PLAVIX) 75 MG tablet Take  75 mg by mouth daily. 11/28/11  Yes Danley Danker, MD  dicyclomine (BENTYL) 20 MG tablet Take 20 mg by mouth 3 (three) times daily before meals.   Yes Historical Provider, MD  DULoxetine (CYMBALTA) 60 MG capsule Take 60 mg by mouth at bedtime.  08/06/14  Yes Historical Provider, MD  ferrous sulfate 325 (65 FE) MG tablet Take 325 mg by mouth daily with breakfast.   Yes Historical Provider, MD  gabapentin (NEURONTIN) 300 MG capsule Take 1 capsule (300 mg total) by mouth at bedtime. 03/29/16  Yes Richard C Tuchman, DPM  glucose ([employer] GLUCOSE) 4 GM chewable tablet Chew 1 tablet by mouth as needed for low blood sugar.   Yes Historical Provider, MD  hydrALAZINE (APRESOLINE) 25 MG tablet Take 1 tablet (25 mg total) by mouth 3 (three) times daily. 04/11/16  Yes Scott Moishe Spice, PA-C  HYDROcodone-acetaminophen (NORCO) 10-325 MG tablet Take 0.5-1 tablets by mouth every 6 (six) hours as needed for moderate pain.  05/12/16  Yes Historical Provider, MD  Insulin Degludec (TRESIBA FLEXTOUCH) 100 UNIT/ML SOPN Inject 22 Units into the skin at bedtime.     Yes Historical Provider, MD  ipratropium-albuterol (DUONEB) 0.5-2.5 (3) MG/3ML SOLN Take 3 mLs by nebulization 2 (two) times daily as needed (for shortnes of breath).    Yes Historical Provider, MD  isosorbide mononitrate (IMDUR) 30 MG 24 hr tablet Take 15 mg by mouth daily.   Yes Historical Provider, MD  lansoprazole (PREVACID) 30 MG capsule Take 30 mg by mouth 2 (two) times daily. 06/08/16  Yes Historical Provider, MD  levothyroxine (SYNTHROID, LEVOTHROID) 75 MCG tablet Take 75 mcg by mouth daily before breakfast.  06/12/16  Yes Historical Provider, MD  Multiple Vitamins-Minerals (WOMENS 50+ MULTI VITAMIN/MIN) TABS Take 1 tablet by mouth daily.   Yes Historical Provider, MD  nitroGLYCERIN (NITROSTAT) 0.4 MG SL tablet Place 0.4 mg under the tongue every 5 (five) minutes as needed for chest pain (x 3 doses). For chest pain   Yes Historical Provider, MD  NOVOLOG FLEXPEN 100 UNIT/ML FlexPen 70 -199 TAKE 12 UNITS, 200-299 TAKE 14 UNITS, 300 OR HIGHER 16 UNITS SUBCUTANEOUS (12-16 units subcutaneously three (3) times a day) 10/01/15  Yes Historical Provider, MD  OXYGEN-HELIUM IN Inhale 4 L into the lungs at bedtime.    Yes Historical Provider, MD  Probiotic Product (PROBIOTIC PO) Take 1 tablet by mouth 2 (two) times daily.   Yes Historical Provider, MD  simvastatin (ZOCOR) 40 MG tablet Take 40 mg by mouth every evening.  11/28/11  Yes Danley Danker, MD  torsemide (DEMADEX) 20 MG tablet Take 4 tablets (80 mg total) by mouth as directed. Every mon, wed and fri take 4 tabs = 80 mg daily; all other days 3 tabs = 60 mg daily 05/20/16  Yes [name], PA-C    Family History Family History  Problem Relation Age of Onset  . Hyperlipidemia Mother   . Other Mother     AAA  . Alzheimer's disease Mother   . Heart disease Mother   . Irregular heart beat Mother   . Diabetes Daughter   . Hypertension Daughter     Social History Social History  Substance Use Topics  . Smoking status: Former Smoker     Packs/day: 1.00    Years: 50.00    Types: Cigarettes    Quit date: 01/24/2012  . Smokeless tobacco: Never Used  . Alcohol use No     Allergies   Aldactone [  spironolactone]; Lisinopril; Crestor [rosuvastatin calcium]; and Vicodin [hydrocodone-acetaminophen]   Review of Systems Review of Systems  All other systems reviewed and are negative.    Physical Exam Updated Vital Signs BP 172/83   Pulse 72   Temp 98.4 F (36.9 C) (Oral)   Resp 18   Ht 5\' 2"  (1.575 m)   Wt 170 lb (77.1 kg)   SpO2 95%   BMI 31.09 kg/m   Physical Exam  Constitutional: She appears well-developed and well-nourished. No distress.  HENT:  Head: Normocephalic and atraumatic.  Eyes: Conjunctivae are normal.  Neck: Neck supple.  Cardiovascular: Normal rate and regular rhythm.   No murmur heard. Pulmonary/Chest: Effort normal and breath sounds normal. No respiratory distress.  Abdominal: Soft. There is no tenderness.  Musculoskeletal: She exhibits no edema.  Minimal contusion to left knee. Full range of motion.  Neurological: She is alert.  Skin: Skin is warm and dry.  Left breast shows extensive ecchymosis throughout the entire globe.  There is a nontender central hematoma approximately 3-4 cm in diameter  Psychiatric: She has a normal mood and affect.  Nursing note and vitals reviewed.    ED Treatments / Results  Labs (all labs ordered are listed, but only abnormal results are displayed) Labs Reviewed - No data to display  EKG  EKG Interpretation None       Radiology Dg Chest 2 View  Result Date: 06/12/2016 CLINICAL DATA:  Larey Seat on abuts yesterday. Pulling sensation left side of chest. EXAM: CHEST  2 VIEW COMPARISON:  11/15/2015.  CT 12/11/2015 FINDINGS: Tracheostomy in place. Previous median sternotomy. Cardiomegaly. Aortic atherosclerosis. Chronic abnormal interstitial lung markings. Chronic focal density in both lower lobes. Findings are unchanged in the previous studies. No evidence of  fracture. IMPRESSION: No acute or traumatic finding. Tracheostomy. Cardiomegaly. Aortic atherosclerosis. Chronic interstitial lung markings. Chronic lower lung density bilaterally left more than right, unchanged from previous studies and determined to be benign. Electronically Signed   By: Paulina Fusi M.D.   On: 06/12/2016 17:51   Dg Knee Complete 4 Views Left  Result Date: 06/12/2016 CLINICAL DATA:  Larey Seat while on a abuts yesterday. Anterior knee pain. EXAM: LEFT KNEE - COMPLETE 4+ VIEW COMPARISON:  None. FINDINGS: No evidence of fracture. There is osteoarthritis more advanced in the medial compartment pain in the lateral compartment and patellofemoral joint. There is a small knee joint effusion. Extensive vascular calcification is noted. IMPRESSION: No acute radiographic finding. 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 Small joint effusion. Electronically Signed   By: Paulina Fusi M.D.   On: 06/12/2016 17:49    Procedures Procedures (including critical care time)  Medications Ordered in ED Medications - No data to display   Initial Impression / Assessment and Plan / ED Course  I have reviewed the triage vital signs and the nursing notes.  Pertinent labs & imaging results that were available during my care of the patient were reviewed by me and considered in my medical decision making (see chart for details).  Clinical Course    Patient is in no acute distress. Left breast exam reveals extensive ecchymosis with a central hematoma. There is no evidence of cellulitis. This was discussed with the patient and her daughter. She will follow-up with her primary care doctor.  Final Clinical Impressions(s) / ED Diagnoses   Final diagnoses:  Fall, initial encounter  Posttraumatic hematoma of left breast, initial encounter  Left knee pain  Traumatic ecchymosis of left female breast, initial encounter  New Prescriptions New Prescriptions   No medications on file       Donnetta HutchingBrian Ouita Nish, MD 06/12/16 [phone]

## 2016-06-12 NOTE — Discharge Instructions (Signed)
X-ray showed no fracture.  The discoloration on your left breast will remain for several weeks. Follow-up with your primary care doctor.

## 2016-06-12 NOTE — ED Triage Notes (Signed)
Pt was trying to get onto the [employer] bus when she tripped on the lift causing injury to left knee, left breast, and left flank area.

## 2016-06-12 NOTE — ED Notes (Signed)
MD at bedside. 

## 2016-06-12 NOTE — ED Notes (Signed)
Patient transported to X-ray 

## 2016-06-21 ENCOUNTER — Telehealth: Payer: Self-pay | Admitting: Cardiology

## 2016-06-21 NOTE — Telephone Encounter (Signed)
Follow Up;     Pt says she was still waiting to find out how long she needs to be off of her Plavix for her teeth being extracted? She thinks the dentist got the referral,but nobody told her what to do.

## 2016-06-21 NOTE — Telephone Encounter (Signed)
Per Dr Skains Rip [place]- Ok Anne Futo hold Plavix 5 days prior and 1 day after tooth extraction.  Clearance was faxed to Dr Morrison Old[place]ambeth, PA 06/15/16  Pt is aware of instructions.

## 2016-06-23 ENCOUNTER — Encounter: Payer: Self-pay | Admitting: Cardiology

## 2016-06-28 ENCOUNTER — Ambulatory Visit: Payer: Medicare Other | Admitting: Internal Medicine

## 2016-06-28 ENCOUNTER — Encounter (HOSPITAL_COMMUNITY): Payer: Self-pay | Admitting: General Practice

## 2016-06-28 ENCOUNTER — Inpatient Hospital Stay (HOSPITAL_COMMUNITY)
Admission: EM | Admit: 2016-06-28 | Discharge: 2016-07-04 | DRG: 378 | Disposition: A | Discharge status: Home / Self Care | Payer: Medicare Other | Attending: Internal Medicine | Admitting: Internal Medicine

## 2016-06-28 DIAGNOSIS — K625 Hemorrhage of anus and rectum: Secondary | ICD-10-CM

## 2016-06-28 DIAGNOSIS — Z7902 Long term (current) use of antithrombotics/antiplatelets: Secondary | ICD-10-CM

## 2016-06-28 DIAGNOSIS — F329 Major depressive disorder, single episode, unspecified: Secondary | ICD-10-CM | POA: Diagnosis present

## 2016-06-28 DIAGNOSIS — I472 Ventricular tachycardia: Secondary | ICD-10-CM | POA: Diagnosis not present

## 2016-06-28 DIAGNOSIS — E1142 Type 2 diabetes mellitus with diabetic polyneuropathy: Secondary | ICD-10-CM | POA: Diagnosis present

## 2016-06-28 DIAGNOSIS — Z794 Long term (current) use of insulin: Secondary | ICD-10-CM

## 2016-06-28 DIAGNOSIS — K5791 Diverticulosis of intestine, part unspecified, without perforation or abscess with bleeding: Principal | ICD-10-CM | POA: Diagnosis present

## 2016-06-28 DIAGNOSIS — E875 Hyperkalemia: Secondary | ICD-10-CM | POA: Diagnosis not present

## 2016-06-28 DIAGNOSIS — R9431 Abnormal electrocardiogram [ECG] [EKG]: Secondary | ICD-10-CM | POA: Diagnosis present

## 2016-06-28 DIAGNOSIS — D62 Acute posthemorrhagic anemia: Secondary | ICD-10-CM | POA: Diagnosis present

## 2016-06-28 DIAGNOSIS — K219 Gastro-esophageal reflux disease without esophagitis: Secondary | ICD-10-CM | POA: Diagnosis present

## 2016-06-28 DIAGNOSIS — W19XXXA Unspecified fall, initial encounter: Secondary | ICD-10-CM | POA: Diagnosis present

## 2016-06-28 DIAGNOSIS — Z8719 Personal history of other diseases of the digestive system: Secondary | ICD-10-CM | POA: Diagnosis present

## 2016-06-28 DIAGNOSIS — K922 Gastrointestinal hemorrhage, unspecified: Secondary | ICD-10-CM | POA: Diagnosis not present

## 2016-06-28 DIAGNOSIS — Z951 Presence of aortocoronary bypass graft: Secondary | ICD-10-CM

## 2016-06-28 DIAGNOSIS — K921 Melena: Secondary | ICD-10-CM | POA: Diagnosis not present

## 2016-06-28 DIAGNOSIS — N183 Chronic kidney disease, stage 3 (moderate): Secondary | ICD-10-CM | POA: Diagnosis present

## 2016-06-28 DIAGNOSIS — I252 Old myocardial infarction: Secondary | ICD-10-CM | POA: Diagnosis not present

## 2016-06-28 DIAGNOSIS — I130 Hypertensive heart and chronic kidney disease with heart failure and stage 1 through stage 4 chronic kidney disease, or unspecified chronic kidney disease: Secondary | ICD-10-CM | POA: Diagnosis present

## 2016-06-28 DIAGNOSIS — Z79899 Other long term (current) drug therapy: Secondary | ICD-10-CM

## 2016-06-28 DIAGNOSIS — E1151 Type 2 diabetes mellitus with diabetic peripheral angiopathy without gangrene: Secondary | ICD-10-CM | POA: Diagnosis present

## 2016-06-28 DIAGNOSIS — R109 Unspecified abdominal pain: Secondary | ICD-10-CM

## 2016-06-28 DIAGNOSIS — Z9981 Dependence on supplemental oxygen: Secondary | ICD-10-CM

## 2016-06-28 DIAGNOSIS — Z6829 Body mass index (BMI) 29.0-29.9, adult: Secondary | ICD-10-CM

## 2016-06-28 DIAGNOSIS — Z885 Allergy status to narcotic agent status: Secondary | ICD-10-CM

## 2016-06-28 DIAGNOSIS — K589 Irritable bowel syndrome without diarrhea: Secondary | ICD-10-CM | POA: Diagnosis present

## 2016-06-28 DIAGNOSIS — Z888 Allergy status to other drugs, medicaments and biological substances status: Secondary | ICD-10-CM

## 2016-06-28 DIAGNOSIS — E1165 Type 2 diabetes mellitus with hyperglycemia: Secondary | ICD-10-CM | POA: Diagnosis present

## 2016-06-28 DIAGNOSIS — I959 Hypotension, unspecified: Secondary | ICD-10-CM | POA: Diagnosis not present

## 2016-06-28 DIAGNOSIS — I5042 Chronic combined systolic (congestive) and diastolic (congestive) heart failure: Secondary | ICD-10-CM

## 2016-06-28 DIAGNOSIS — Z87891 Personal history of nicotine dependence: Secondary | ICD-10-CM

## 2016-06-28 DIAGNOSIS — Z6830 Body mass index (BMI) 30.0-30.9, adult: Secondary | ICD-10-CM

## 2016-06-28 DIAGNOSIS — I739 Peripheral vascular disease, unspecified: Secondary | ICD-10-CM | POA: Diagnosis present

## 2016-06-28 DIAGNOSIS — E1122 Type 2 diabetes mellitus with diabetic chronic kidney disease: Secondary | ICD-10-CM | POA: Diagnosis present

## 2016-06-28 DIAGNOSIS — I2510 Atherosclerotic heart disease of native coronary artery without angina pectoris: Secondary | ICD-10-CM | POA: Diagnosis present

## 2016-06-28 DIAGNOSIS — N184 Chronic kidney disease, stage 4 (severe): Secondary | ICD-10-CM | POA: Diagnosis present

## 2016-06-28 DIAGNOSIS — M25552 Pain in left hip: Secondary | ICD-10-CM

## 2016-06-28 DIAGNOSIS — J9611 Chronic respiratory failure with hypoxia: Secondary | ICD-10-CM | POA: Diagnosis present

## 2016-06-28 DIAGNOSIS — E785 Hyperlipidemia, unspecified: Secondary | ICD-10-CM | POA: Diagnosis present

## 2016-06-28 DIAGNOSIS — I10 Essential (primary) hypertension: Secondary | ICD-10-CM

## 2016-06-28 DIAGNOSIS — R509 Fever, unspecified: Secondary | ICD-10-CM

## 2016-06-28 DIAGNOSIS — I4581 Long QT syndrome: Secondary | ICD-10-CM | POA: Diagnosis present

## 2016-06-28 DIAGNOSIS — Z833 Family history of diabetes mellitus: Secondary | ICD-10-CM

## 2016-06-28 DIAGNOSIS — Z8673 Personal history of transient ischemic attack (TIA), and cerebral infarction without residual deficits: Secondary | ICD-10-CM

## 2016-06-28 DIAGNOSIS — Z8249 Family history of ischemic heart disease and other diseases of the circulatory system: Secondary | ICD-10-CM

## 2016-06-28 DIAGNOSIS — Z930 Tracheostomy status: Secondary | ICD-10-CM

## 2016-06-28 DIAGNOSIS — E039 Hypothyroidism, unspecified: Secondary | ICD-10-CM | POA: Diagnosis present

## 2016-06-28 DIAGNOSIS — J449 Chronic obstructive pulmonary disease, unspecified: Secondary | ICD-10-CM | POA: Diagnosis present

## 2016-06-28 DIAGNOSIS — Z7982 Long term (current) use of aspirin: Secondary | ICD-10-CM

## 2016-06-28 HISTORY — DX: Hemorrhage of anus and rectum: K62.5

## 2016-06-28 HISTORY — DX: Depression, unspecified: F32.A

## 2016-06-28 HISTORY — DX: Type 2 diabetes mellitus without complications: E11.9

## 2016-06-28 HISTORY — DX: Low back pain: M54.5

## 2016-06-28 HISTORY — DX: Dependence on supplemental oxygen: Z99.81

## 2016-06-28 HISTORY — DX: Personal history of other medical treatment: Z92.89

## 2016-06-28 HISTORY — DX: Low back pain, unspecified: M54.50

## 2016-06-28 HISTORY — DX: Unspecified osteoarthritis, unspecified site: M19.90

## 2016-06-28 HISTORY — DX: Major depressive disorder, single episode, unspecified: F32.9

## 2016-06-28 HISTORY — DX: Other chronic pain: G89.29

## 2016-06-28 LAB — COMPREHENSIVE METABOLIC PANEL
ALT: 31 U/L (ref 14–54)
ANION GAP: 7 (ref 5–15)
AST: 27 U/L (ref 15–41)
Albumin: 3.1 g/dL — ABNORMAL LOW (ref 3.5–5.0)
Alkaline Phosphatase: 73 U/L (ref 38–126)
BUN: 56 mg/dL — ABNORMAL HIGH (ref 6–20)
CHLORIDE: 108 mmol/L (ref 101–111)
CO2: 29 mmol/L (ref 22–32)
Calcium: 8.7 mg/dL — ABNORMAL LOW (ref 8.9–10.3)
Creatinine, Ser: 1.62 mg/dL — ABNORMAL HIGH (ref 0.44–1.00)
GFR, EST AFRICAN AMERICAN: 36 mL/min — AB (ref >60)
GFR, EST NON AFRICAN AMERICAN: 31 mL/min — AB (ref >60)
Glucose, Bld: 99 mg/dL (ref 65–99)
POTASSIUM: 4.8 mmol/L (ref 3.5–5.1)
Sodium: 144 mmol/L (ref 135–145)
TOTAL PROTEIN: 6.1 g/dL — AB (ref 6.5–8.1)
Total Bilirubin: 0.5 mg/dL (ref 0.3–1.2)

## 2016-06-28 LAB — CBC
HCT: 33.9 % — ABNORMAL LOW (ref 36.0–46.0)
HEMATOCRIT: 35.7 % — AB (ref 36.0–46.0)
HEMOGLOBIN: 10.9 g/dL — AB (ref 12.0–15.0)
Hemoglobin: 10.4 g/dL — ABNORMAL LOW (ref 12.0–15.0)
MCH: 29.1 pg (ref 26.0–34.0)
MCH: 28.3 pg (ref 26.0–34.0)
MCHC: 30.5 g/dL (ref 30.0–36.0)
MCHC: 30.7 g/dL (ref 30.0–36.0)
MCV: 95.2 fL (ref 78.0–100.0)
MCV: 92.1 fL (ref 78.0–100.0)
PLATELETS: 130 10*3/uL — AB (ref 150–400)
Platelets: 144 10*3/uL — ABNORMAL LOW (ref 150–400)
RBC: 3.75 MIL/uL — AB (ref 3.87–5.11)
RBC: 3.68 MIL/uL — ABNORMAL LOW (ref 3.87–5.11)
RDW: 15.3 % (ref 11.5–15.5)
RDW: 16.1 % — AB (ref 11.5–15.5)
WBC: 4.9 10*3/uL (ref 4.0–10.5)
WBC: 7.5 10*3/uL (ref 4.0–10.5)

## 2016-06-28 LAB — PREPARE RBC (CROSSMATCH)

## 2016-06-28 LAB — LIPASE, BLOOD: LIPASE: 24 U/L (ref 11–51)

## 2016-06-28 LAB — GLUCOSE, CAPILLARY
GLUCOSE-CAPILLARY: 117 mg/dL — AB (ref 65–99)
Glucose-Capillary: 78 mg/dL (ref 65–99)

## 2016-06-28 MED ORDER — FERROUS SULFATE 325 (65 FE) MG PO TABS
325.0000 mg | ORAL_TABLET | Freq: Every day | ORAL | Status: DC
  Administered 2016-06-29 – 2016-07-04 (×6): 325 mg via ORAL
  Filled 2016-06-28 (×6): qty 1

## 2016-06-28 MED ORDER — ASPIRIN EC 81 MG PO TBEC
81.0000 mg | DELAYED_RELEASE_TABLET | Freq: Every day | ORAL | Status: DC
  Administered 2016-06-29 – 2016-07-04 (×6): 81 mg via ORAL
  Filled 2016-06-28 (×6): qty 1

## 2016-06-28 MED ORDER — CARVEDILOL 12.5 MG PO TABS
12.5000 mg | ORAL_TABLET | Freq: Two times a day (BID) | ORAL | Status: DC
  Administered 2016-06-29: 12.5 mg via ORAL
  Filled 2016-06-28: qty 1

## 2016-06-28 MED ORDER — HYDRALAZINE HCL 25 MG PO TABS
25.0000 mg | ORAL_TABLET | Freq: Three times a day (TID) | ORAL | Status: DC
  Administered 2016-06-28 – 2016-06-29 (×2): 25 mg via ORAL
  Filled 2016-06-28 (×3): qty 1

## 2016-06-28 MED ORDER — INSULIN ASPART 100 UNIT/ML ~~LOC~~ SOLN
0.0000 [IU] | Freq: Every day | SUBCUTANEOUS | Status: DC
  Administered 2016-06-29 – 2016-07-03 (×2): 3 [IU] via SUBCUTANEOUS

## 2016-06-28 MED ORDER — IPRATROPIUM-ALBUTEROL 0.5-2.5 (3) MG/3ML IN SOLN: 3.0000 mL | Freq: Two times a day (BID) | RESPIRATORY_TRACT | Status: DC | PRN

## 2016-06-28 MED ORDER — DULOXETINE HCL 60 MG PO CPEP
60.0000 mg | ORAL_CAPSULE | Freq: Every day | ORAL | Status: DC
  Administered 2016-06-28 – 2016-07-03 (×6): 60 mg via ORAL
  Filled 2016-06-28 (×7): qty 1

## 2016-06-28 MED ORDER — SODIUM CHLORIDE 0.9 % IV SOLN
Freq: Once | INTRAVENOUS | Status: AC
  Administered 2016-06-28: 13:00:00 via INTRAVENOUS

## 2016-06-28 MED ORDER — SODIUM CHLORIDE 0.9 % IV SOLN
10.0000 mL/h | Freq: Once | INTRAVENOUS | Status: AC
  Administered 2016-06-28: 10 mL/h via INTRAVENOUS

## 2016-06-28 MED ORDER — LEVOTHYROXINE SODIUM 75 MCG PO TABS
75.0000 ug | ORAL_TABLET | Freq: Every day | ORAL | Status: DC
  Administered 2016-06-29 – 2016-07-04 (×6): 75 ug via ORAL
  Filled 2016-06-28 (×6): qty 1

## 2016-06-28 MED ORDER — GABAPENTIN 300 MG PO CAPS
300.0000 mg | ORAL_CAPSULE | Freq: Every day | ORAL | Status: DC
  Administered 2016-06-28 – 2016-07-03 (×6): 300 mg via ORAL
  Filled 2016-06-28 (×7): qty 1

## 2016-06-28 MED ORDER — SIMVASTATIN 40 MG PO TABS
40.0000 mg | ORAL_TABLET | Freq: Every evening | ORAL | Status: DC
  Administered 2016-06-28 – 2016-07-03 (×6): 40 mg via ORAL
  Filled 2016-06-28 (×6): qty 1

## 2016-06-28 MED ORDER — TORSEMIDE 20 MG PO TABS
80.0000 mg | ORAL_TABLET | ORAL | Status: DC
  Administered 2016-06-29 – 2016-07-04 (×3): 80 mg via ORAL
  Filled 2016-06-28 (×3): qty 4

## 2016-06-28 MED ORDER — PANTOPRAZOLE SODIUM 40 MG PO TBEC
40.0000 mg | DELAYED_RELEASE_TABLET | Freq: Every day | ORAL | Status: DC
  Administered 2016-06-28 – 2016-07-04 (×7): 40 mg via ORAL
  Filled 2016-06-28 (×7): qty 1

## 2016-06-28 MED ORDER — ONDANSETRON HCL 4 MG/2ML IJ SOLN: 4.0000 mg | Freq: Four times a day (QID) | INTRAMUSCULAR | Status: DC | PRN

## 2016-06-28 MED ORDER — DICYCLOMINE HCL 20 MG PO TABS
20.0000 mg | ORAL_TABLET | Freq: Three times a day (TID) | ORAL | Status: DC
  Administered 2016-06-28 – 2016-07-04 (×17): 20 mg via ORAL
  Filled 2016-06-28 (×20): qty 1

## 2016-06-28 MED ORDER — RISAQUAD PO CAPS
ORAL_CAPSULE | Freq: Two times a day (BID) | ORAL | Status: DC
  Administered 2016-06-28 – 2016-07-04 (×13): 1 via ORAL
  Filled 2016-06-28 (×14): qty 1

## 2016-06-28 MED ORDER — ISOSORBIDE MONONITRATE ER 30 MG PO TB24
15.0000 mg | ORAL_TABLET | Freq: Every day | ORAL | Status: DC
  Administered 2016-06-29: 15 mg via ORAL
  Filled 2016-06-28 (×2): qty 1

## 2016-06-28 MED ORDER — ONDANSETRON HCL 4 MG PO TABS: 4.0000 mg | ORAL_TABLET | Freq: Four times a day (QID) | ORAL | Status: DC | PRN

## 2016-06-28 MED ORDER — TORSEMIDE 20 MG PO TABS
60.0000 mg | ORAL_TABLET | ORAL | Status: DC
  Administered 2016-06-30 – 2016-07-03 (×3): 60 mg via ORAL
  Filled 2016-06-28 (×4): qty 3

## 2016-06-28 MED ORDER — INSULIN DEGLUDEC 100 UNIT/ML ~~LOC~~ SOPN: 11.0000 [IU] | PEN_INJECTOR | Freq: Every day | SUBCUTANEOUS | Status: DC

## 2016-06-28 MED ORDER — INSULIN ASPART 100 UNIT/ML ~~LOC~~ SOLN
0.0000 [IU] | Freq: Three times a day (TID) | SUBCUTANEOUS | Status: DC
  Administered 2016-06-29 – 2016-06-30 (×3): 3 [IU] via SUBCUTANEOUS
  Administered 2016-06-30 (×2): 5 [IU] via SUBCUTANEOUS
  Administered 2016-07-01 (×2): 2 [IU] via SUBCUTANEOUS
  Administered 2016-07-02: 1 [IU] via SUBCUTANEOUS
  Administered 2016-07-02 (×2): 2 [IU] via SUBCUTANEOUS
  Administered 2016-07-03: 7 [IU] via SUBCUTANEOUS
  Administered 2016-07-03: 3 [IU] via SUBCUTANEOUS
  Administered 2016-07-03: 2 [IU] via SUBCUTANEOUS
  Administered 2016-07-04: 3 [IU] via SUBCUTANEOUS

## 2016-06-28 MED ORDER — HYDROCODONE-ACETAMINOPHEN 10-325 MG PO TABS
0.5000 | ORAL_TABLET | Freq: Four times a day (QID) | ORAL | Status: DC | PRN
  Administered 2016-06-28 – 2016-07-04 (×16): 1 via ORAL
  Filled 2016-06-28 (×17): qty 1

## 2016-06-28 NOTE — Consult Note (Signed)
[facility] Consult Note  Referring Provider: No ref. provider found Primary Care Physician:  Vidal Schwalbe, MD Primary Gastroenterologist:  Dr.  Laurel Dimmer Complaint: Rectal bleeding HPI: [name] is an 70 y.o. black female  who presents with onset of painless hematochezia this morning with several bloody bowel movements with a hemoglobin of 10.8 which is about her baseline. She had a history of lower GI bleed 1 year ago with colonoscopy revealing diverticulosis with presumed source of her bleed. She is on Plavix for coronary artery disease. She denies melena and denies nonsteroidal anti-inflammatory drug use.  Past Medical History:  Diagnosis Date  . Anemia   . CAD (coronary artery disease)    a. S/p CABG and stenting // b. Myoview 3/16: high risk EF 25%, ant-apical AK, ant scar with peri-infarct ischemia // c. LHC 3/16 - EF 45%, inferior hypokinesis, oLAD 100, oLCx 40, branch vessel 50, pRCA 100 dRCA , RV br 90, S-RCA occluded, S-LAD patent with inf-apical LAD beyond graft 70 >> med Rx  . Carotid artery disease ([place])    a. s/p L CEA // b. Carotid US 4/16: R 40-59%; L CEA patent with 40-59%  . Chronic combined systolic and diastolic CHF (congestive heart failure) ([place])    a. 11/2011 Echo with EF 30-35%, global hypokinesis, and inferior akinesis;  b. 12/2014 Echo: EF 50%, mod LVH, Ao sclerosis w/o stenosis, mildly dil LA, mild to mod RV dysfxn. // c. Echo 10/16: Moderate LVH, EF 35-40%, inferior AK, grade 1 diastolic dysfunction, MAC, mild MR, moderate LAE  . CKD (chronic kidney disease), stage III    stage 3  . COPD (chronic obstructive pulmonary disease) (HCC)    a. prn and HS supplemental O2.  . DDD (degenerative disc disease), lumbar   . Diabetes mellitus    on Lantus 30 U   . GERD (gastroesophageal reflux disease)   . History of IBS   . History of tracheostomy ([place]) 08/26/2013   Dr. Redmond Baseman  . Hyperlipidemia   . Hypertension   . Hypothyroidism    Goiter  . MI  (myocardial infarction) ([place]) 1997  . PAD (peripheral artery disease) ([place])   . Stroke [facility])    MRI 11/2011 with remote occipital lobe. MRA with moderate left focal vertebral artery stenosis    Past Surgical History:  Procedure Laterality Date  . ABDOMINAL AORTAGRAM N/A 04/05/2013   Procedure: ABDOMINAL Maxcine Ham;  Surgeon: Elam Dutch, MD;  [facility] LAB;  Service: Cardiovascular;  Laterality: N/A;  . [id number]  [id number]   Aortogram by Dr. Mali McKenzie [facility])  . CAROTID ENDARTERECTOMY  ~2008   Left   . CARPAL TUNNEL RELEASE Right   . CHOLECYSTECTOMY    . COLONOSCOPY N/A 10/27/2014   Procedure: COLONOSCOPY;  Surgeon: Arta Silence, MD;  Location: [facility] ENDOSCOPY;  Service: Endoscopy;  Laterality: N/A;  . CORONARY ARTERY BYPASS GRAFT     2 vessel  . FEMORAL-TIBIAL BYPASS GRAFT Left 06/11/2013   Procedure: BYPASS GRAFT  LEFT FEMORAL- POSTERIOR TIBIAL ARTERY/ REDO;  Surgeon: Elam Dutch, MD;  Location: [place];  Service: Vascular;  Laterality: Left;  . LEFT AND RIGHT HEART CATHETERIZATION WITH CORONARY ANGIOGRAM N/A 01/09/2015   Procedure: LEFT AND RIGHT HEART CATHETERIZATION WITH CORONARY ANGIOGRAM;  Surgeon: Leonie Man, MD;  Location: [facility] CATH LAB;  Service: Cardiovascular;  Laterality: N/A;  . PR VEIN BYPASS GRAFT,AORTO-FEM-POP     Right common femoral-AK popliteal BPG & Right Popliteal-posterior tibial  . PR VEIN  BYPASS GRAFT,AORTO-FEM-POP     Left Fem-pop BPG  . PTCA    . SPINE SURGERY  Oct. 27, 2014   Injection - Back  . THROMBECTOMY FEMORAL ARTERY Left 06/11/2013   Procedure: THROMBECTOMY FEMORAL ARTERY;  Surgeon: Elam Dutch, MD;  Location: [place];  Service: Vascular;  Laterality: Left;  . THYROIDECTOMY    . TRACHEOSTOMY TUBE PLACEMENT  01/02/2012     (Not in a hospital admission)  Allergies:  Allergies  Allergen Reactions  . Aldactone [Spironolactone] Other (See Comments)    Severe hyperkalemia   . Lisinopril Other (See Comments) and  Cough    Hypotension also  . Crestor [Rosuvastatin Calcium] Other (See Comments)    Muscle Pain  . Vicodin [Hydrocodone-Acetaminophen] Nausea And Vomiting    Family History  Problem Relation Age of Onset  . Hyperlipidemia Mother   . Other Mother     AAA  . Alzheimer's disease Mother   . Heart disease Mother   . Irregular heart beat Mother   . Diabetes Daughter   . Hypertension Daughter     Social History:  reports that she quit smoking about 4 years ago. Her smoking use included Cigarettes. She has a 50.00 pack-year smoking history. She has never used smokeless tobacco. She reports that she does not drink alcohol or use drugs.  Review of Systems: negative except as above   Blood pressure 139/76, pulse 70, temperature 97.7 F (36.5 C), temperature source Oral, resp. rate 19, SpO2 92 %. Head: Normocephalic, without obvious abnormality, atraumatic Neck: no adenopathy, no carotid bruit, no JVD, supple, symmetrical, trachea midline and thyroid not enlarged, symmetric, no tenderness/mass/nodules Resp: clear to auscultation bilaterally Cardio: regular rate and rhythm, S1, S2 normal, no murmur, click, rub or gallop GI: Abdomen soft nontender nondistended with normoactive bowel sounds. No hepatosplenomegaly mass or guarding Extremities: extremities normal, atraumatic, no cyanosis or edema  Results for orders placed or performed during the hospital encounter of 06/28/16 (from the past 48 hour(s))  Lipase, blood     Status: None   Collection Time: 06/28/16 10:51 AM  Result Value Ref Range   Lipase 24 11 - 51 U/L  Comprehensive metabolic panel     Status: Abnormal   Collection Time: 06/28/16 10:51 AM  Result Value Ref Range   Sodium 144 135 - 145 mmol/L   Potassium 4.8 3.5 - 5.1 mmol/L   Chloride 108 101 - 111 mmol/L   CO2 29 22 - 32 mmol/L   Glucose, Bld 99 65 - 99 mg/dL   BUN 56 (H) 6 - 20 mg/dL   Creatinine, Ser 1.62 (H) 0.44 - 1.00 mg/dL   Calcium 8.7 (L) 8.9 - 10.3 mg/dL    Total Protein 6.1 (L) 6.5 - 8.1 g/dL   Albumin 3.1 (L) 3.5 - 5.0 g/dL   AST 27 15 - 41 U/L   ALT 31 14 - 54 U/L   Alkaline Phosphatase 73 38 - 126 U/L   Total Bilirubin 0.5 0.3 - 1.2 mg/dL   GFR calc non Af Amer 31 (L) >60 mL/min   GFR calc Af Amer 36 (L) >60 mL/min    Comment: (NOTE) The eGFR has been calculated using the CKD EPI equation. This calculation has not been validated in all clinical situations. eGFR's persistently <60 mL/min signify possible Chronic Kidney Disease.    Anion gap 7 5 - 15  CBC     Status: Abnormal   Collection Time: 06/28/16 10:51 AM  Result Value Ref Range  WBC 4.9 4.0 - 10.5 K/uL   RBC 3.75 (L) 3.87 - 5.11 MIL/uL   Hemoglobin 10.9 (L) 12.0 - 15.0 g/dL   HCT 35.7 (L) 36.0 - 46.0 %   MCV 95.2 78.0 - 100.0 fL   MCH 29.1 26.0 - 34.0 pg   MCHC 30.5 30.0 - 36.0 g/dL   RDW 15.3 11.5 - 15.5 %   Platelets 144 (L) 150 - 400 K/uL   No results found.  Assessment: Painless GI bleed, presumed lower secondary to diverticulosis exacerbated by anticoagulation. Plan:  Hold off on specific imaging for now. Will monitor stools and hemoglobin and hold Plavix. Consider sigmoidoscopy colonoscopy report will RBC scan if necessary. We'll follow with you. We'll keep on clear liquid diet for now. Gisell Buehrle C 06/28/2016, 1:19 PM  Pager [phone] If no answer or after 5 PM call [phone]

## 2016-06-28 NOTE — ED Notes (Signed)
Admitting MD at bedside.

## 2016-06-28 NOTE — Progress Notes (Signed)
NURSING PROGRESS NOTE  Scot JunDorothy I Rugg [id number] Admission Data: 06/28/2016 2:36 PM Attending Provider: Ozella Rocksavid J Merrell, MD ZHY:[id number],IONGEXBPCP:WHITE,Hazem Kenner S, MD Code Status: FULL  Scot JunDorothy I Ham is a 70 y.o. female patient admitted from ED:  -No acute distress noted.  -No complaints of shortness of breath.  -No complaints of chest pain.   Cardiac Monitoring: Box # 20 in place. Cardiac monitor yields:normal sinus rhythm.  Blood pressure (!) 98/49, pulse 72, temperature 97.3 F (36.3 C), temperature source Oral, resp. rate 15, height 5\' 2"  (1.575 m), weight 72.7 kg (160 lb 3.2 oz), SpO2 94 %.   IV Fluids:  IV in place, occlusive dsg intact without redness, IV cath antecubital left, condition patent and no redness none.   Allergies:  Aldactone [spironolactone]; Lisinopril; Crestor [rosuvastatin calcium]; and Vicodin [hydrocodone-acetaminophen]  Past Medical History:   has a past medical history of Anemia; CAD (coronary artery disease); Carotid artery disease (HCC); Chronic combined systolic and diastolic CHF (congestive heart failure) (HCC); CKD (chronic kidney disease), stage III; COPD (chronic obstructive pulmonary disease) (HCC); DDD (degenerative disc disease), lumbar; Diabetes mellitus; GERD (gastroesophageal reflux disease); History of IBS; History of tracheostomy (HCC) (08/26/2013); Hyperlipidemia; Hypertension; Hypothyroidism; MI (myocardial infarction) (HCC) (1997); PAD (peripheral artery disease) (HCC); and Stroke (HCC).  Past Surgical History:   has a past surgical history that includes Mitral valve replacement; Thyroidectomy; Coronary artery bypass graft; Carotid endarterectomy (~2008); Cholecystectomy; Tracheostomy tube placement (01/02/2012); Angioplasty ([id number]); vein bypass graft,aorto-fem-pop; vein bypass graft,aorto-fem-pop; Carpal tunnel release (Right); Femoral-tibial Bypass Graft (Left, 06/11/2013); Thrombectomy femoral artery (Left, 06/11/2013); Spine surgery (Oct. 27, 2014);  abdominal aortagram (N/A, 04/05/2013); Colonoscopy (N/A, 10/27/2014); and left and right heart catheterization with coronary angiogram (N/A, 01/09/2015).  Social History:   reports that she quit smoking about 4 years ago. Her smoking use included Cigarettes. She has a 50.00 pack-year smoking history. She has never used smokeless tobacco. She reports that she does not drink alcohol or use drugs.  Skin: Intact. Bruising to left hip and left lower extremity.   Patient/Family orientated to room. Information packet given to patient/family. Admission inpatient armband information verified with patient/family to include name and date of birth and placed on patient arm. Side rails up x 2, fall assessment and education completed with patient/family. Patient/family able to verbalize understanding of risk associated with falls and verbalized understanding to call for assistance before getting out of bed. Call light within reach. Patient/family able to voice and demonstrate understanding of unit orientation instructions.    Will continue to evaluate and treat per MD orders.  Bennie Pieriniyndi Tori Dattilio, RN

## 2016-06-28 NOTE — Progress Notes (Signed)
BP 86/63 HR 70 15 minutes after PRBCs started. Pt A&O. Paged Dr Konrad DoloresMerrell. Waiting to hear back from Dr. Stann MainlandWill continue to monitor.

## 2016-06-28 NOTE — Progress Notes (Signed)
6 cuffless shiley and needs O2 at [facility]

## 2016-06-28 NOTE — ED Provider Notes (Signed)
[facility]-EMERGENCY DEPT Provider Note   CSN: [id number] Arrival date & time: 06/28/16  1034   History   Chief Complaint Chief Complaint  Patient presents with  . GI Bleeding    HPI  [name] is a 70 y.o. female Patient states she got up this morning to use the batheroom. Patient states she both bright red blood and black stools. Patient states she had 3 bowel movements, all of them had blood and black stools. Patient also noticed the pad that she was sleeping on had blood on it. Patient has had pain lower left side pain, with 10/10. Indicates pain is constant.  Patient states that walking makes the pain worse. Patient denies any nausea/vomitting. Eating and drinking without issues. Patient has tried tylenol for the pain, states it has helped some. Patient had a fall about two weeks, and the left side was bruised.  Patient had a GI bleed about a year. Patient has hx of internal hemorrhoids which was considered the likely cause of GI bleed about a year. Denies any recent NSAID. Patient is on plavix and takes baby aspirin daily .Patient denies any dizziness, fatigue, SOB, or palpations. Patient also mentioned left leg cramps which she said have been present since the fall      Past Medical History:  Diagnosis Date  . Anemia   . CAD (coronary artery disease)    a. S/p CABG and stenting // b. Myoview 3/16: high risk EF 25%, ant-apical AK, ant scar with peri-infarct ischemia // c. LHC 3/16 - EF 45%, inferior hypokinesis, oLAD 100, oLCx 40, branch vessel 50, pRCA 100 dRCA , RV br 90, S-RCA occluded, S-LAD patent with inf-apical LAD beyond graft 70 >> med Rx  . Carotid artery disease (HCC)    a. s/p L CEA // b. Carotid US 4/16: R 40-59%; L CEA patent with 40-59%  . Chronic combined systolic and diastolic CHF (congestive heart failure) (HCC)    a. 11/2011 Echo with EF 30-35%, global hypokinesis, and inferior akinesis;  b. 12/2014 Echo: EF 50%, mod LVH, Ao sclerosis w/o stenosis, mildly dil LA,  mild to mod RV dysfxn. // c. Echo 10/16: Moderate LVH, EF 35-40%, inferior AK, grade 1 diastolic dysfunction, MAC, mild MR, moderate LAE  . CKD (chronic kidney disease), stage III    stage 3  . COPD (chronic obstructive pulmonary disease) (HCC)    a. prn and HS supplemental O2.  . DDD (degenerative disc disease), lumbar   . Diabetes mellitus    on Lantus 30 U   . GERD (gastroesophageal reflux disease)   . History of IBS   . History of tracheostomy (HCC) 08/26/2013   Dr. Jenne Pane  . Hyperlipidemia   . Hypertension   . Hypothyroidism    Goiter  . MI (myocardial infarction) (HCC) 1997  . PAD (peripheral artery disease) (HCC)   . Stroke [facility])    MRI 11/2011 with remote occipital lobe. MRA with moderate left focal vertebral artery stenosis    Patient Active Problem List   Diagnosis Date Noted  . Ischemic cardiomyopathy 04/11/2016  . AV block, 1st degree 11/15/2015  . Respiratory failure (HCC) 11/10/2015  . Chronic kidney disease, stage 3 11/10/2015  . Cancer screening 11/04/2015  . COPD, severe (HCC) 11/04/2015  . Chronic respiratory failure with hypoxia (HCC) 11/04/2015  . Status post tracheostomy (HCC) 11/04/2015  . Tracheitis   . Acute on chronic respiratory failure with hypoxia (HCC) 07/28/2015  . DM type 2 causing CKD stage 3 (  HCC) 07/28/2015  . Dyspnea 07/27/2015  . Abnormal EKG 07/27/2015  . Acute respiratory failure with hypoxia (HCC) 07/27/2015  . Chronic combined systolic and diastolic CHF (congestive heart failure) (HCC) 07/27/2015  . Acute pulmonary edema (HCC)   . Pain in the chest   . Abnormal nuclear stress test   . Lower GI bleed 10/22/2014  . GI bleeding 10/22/2014  . Chronic systolic heart failure (HCC) 07/17/2014  . Chronic airway obstruction, not elsewhere classified 07/17/2014  . Shortness of breath 07/17/2014  . Wound drainage-Left medial lower leg 03/06/2014  . Atherosclerosis of native arteries of the extremities with intermittent claudication 01/16/2014    . Aftercare following surgery of the circulatory system, NEC 09/05/2013  . History of tracheostomy (HCC) 08/26/2013  . Encounter for staple removal 07/18/2013  . Numbness in left leg 03/28/2013  . Discoloration of skin- left foot 03/28/2013  . Pain in limb- left leg and foot 03/28/2013  . Peripheral vascular disease, unspecified (HCC) 03/28/2013  . Nausea vomiting and diarrhea 01/01/2013  . Dehydration 01/01/2013  . Nonspecific abnormal electrocardiogram (ECG) (EKG)/QT prolongation 01/01/2013  . Anemia 10/21/2012  . CAP (community acquired pneumonia) 10/18/2012  . Occlusion and stenosis of carotid artery without mention of cerebral infarction 02/02/2012  . 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 Hyperkalemia 12/15/2011  . Acute renal failure (HCC) 12/15/2011  . SOB (shortness of breath) 12/01/2011  . Diabetes mellitus type 2, uncontrolled (HCC) 11/26/2011  . Hypertension 11/26/2011  . Hyperlipidemia 11/26/2011  . CVA (cerebral infarction) 11/26/2011  . COPD (chronic obstructive pulmonary disease) (HCC) 11/26/2011  . H/O hyperthyroidism s/p thyroidectomy 11/26/2011  . Peripheral neuropathy (HCC) 11/26/2011  . History of MI (myocardial infarction)   . CAD (coronary artery disease)   . Chronic systolic congestive heart failure, NYHA class 3/EF 20-25% (ECHO 10/2012)   . S/P CABG (coronary artery bypass graft)   . PAD (peripheral artery disease) (HCC)   . Hx-TIA (transient ischemic attack) 01/24/2011    Past Surgical History:  Procedure Laterality Date  . ABDOMINAL AORTAGRAM N/A 04/05/2013   Procedure: ABDOMINAL Ronny Flurry;  Surgeon: Sherren Kerns, MD;  Location: [facility] CATH LAB;  Service: Cardiovascular;  Laterality: N/A;  . [id number]  [id number]   [facility]. [place] [facility]  . CAROTID ENDARTERECTOMY  ~2008   Left   . CARPAL TUNNEL RELEASE Right   . CHOLECYSTECTOMY    . COLONOSCOPY N/A 10/27/2014   Procedure: COLONOSCOPY;  Surgeon: Willis Modena, MD;  Location[facility] ENDOSCOPY;  Service: Endoscopy;  Laterality: N/A;  . CORONARY ARTERY BYPASS GRAFT     2 vessel  . FEMORAL-TIBIAL BYPASS GRAFT Left 06/11/2013   Procedure: BYPASS GRAFT  LEFT FEMORAL- POSTERIOR TIBIAL ARTERY/ REDO;  Surgeon: Sherren Kerns, MD;  Location: [facility] OR;  Service: Vascular;  Laterality: Left;  . LEFT AND RIGHT HEART CATHETERIZATION WITH CORONARY ANGIOGRAM N/A 01/09/2015   Procedure: LEFT AND RIGHT HEART CATHETERIZATION WITH CORONARY ANGIOGRAM;  Surgeon: Marykay Lex, MD;  Location: [facility] CATH LAB;  Service: Cardiovascular;  Laterality: N/A;  . PR VEIN BYPASS GRAFT,AORTO-FEM-POP     Right common femoral-AK popliteal BPG & Right Popliteal-posterior tibial  . PR VEIN BYPASS GRAFT,AORTO-FEM-POP     Left Fem-pop BPG  . PTCA    . SPINE SURGERY  Oct. 27, 2014   Injection - Back  . THROMBECTOMY FEMORAL ARTERY Left 06/11/2013   Procedure: THROMBECTOMY FEMORAL ARTERY;  Surgeon: Sherren Kerns, MD;  Location: [facility] OR;  Service: Vascular;  Laterality: Left;  . THYROIDECTOMY    . TRACHEOSTOMY TUBE PLACEMENT  01/02/2012    OB History    No data available       Home Medications    Prior to Admission medications   Medication Sig Start Date End Date Taking? Authorizing Provider  acetaminophen (TYLENOL) 500 MG tablet Take 1,000 mg by mouth daily as needed for mild pain or headache.     Historical Provider, MD  aspirin EC 81 MG tablet Take 81 mg by mouth daily.    Historical Provider, MD  carvedilol (COREG) 12.5 MG tablet Take 1 tablet (12.5 mg total) by mouth 2 (two) times daily with a meal. 03/29/16   Jake BatheMark C Skains, MD  clopidogrel (PLAVIX) 75 MG tablet Take 75 mg by mouth daily. 11/28/11   Danley DankerBradley W Wainright, MD  dicyclomine (BENTYL) 20 MG tablet Take 20 mg by mouth 3 (three) times daily before meals.    Historical Provider, MD  DULoxetine (CYMBALTA) 60 MG capsule Take 60 mg by mouth at bedtime.  08/06/14   Historical Provider, MD  ferrous sulfate 325 (65 FE) MG tablet  Take 325 mg by mouth daily with breakfast.    Historical Provider, MD  gabapentin (NEURONTIN) 300 MG capsule Take 1 capsule (300 mg total) by mouth at bedtime. 03/29/16   [name] Tuchman, DPM  glucose ([facility] GLUCOSE) 4 GM chewable tablet Chew 1 tablet by mouth as needed for low blood sugar.    Historical Provider, MD  hydrALAZINE (APRESOLINE) 25 MG tablet Take 1 tablet (25 mg total) by mouth 3 (three) times daily. 04/11/16   Beatrice LecherScott T Weaver, PA-C  HYDROcodone-acetaminophen (NORCO) 10-325 MG tablet Take 0.5-1 tablets by mouth every 6 (six) hours as needed for moderate pain.  05/12/16   Historical Provider, MD  Insulin Degludec (TRESIBA FLEXTOUCH) 100 UNIT/ML SOPN Inject 22 Units into the skin at bedtime.     Historical Provider, MD  ipratropium-albuterol (DUONEB) 0.5-2.5 (3) MG/3ML SOLN Take 3 mLs by nebulization 2 (two) times daily as needed (for shortnes of breath).     Historical Provider, MD  isosorbide mononitrate (IMDUR) 30 MG 24 hr tablet Take 15 mg by mouth daily.    Historical Provider, MD  lansoprazole (PREVACID) 30 MG capsule Take 30 mg by mouth 2 (two) times daily. 06/08/16   Historical Provider, MD  levothyroxine (SYNTHROID, LEVOTHROID) 75 MCG tablet Take 75 mcg by mouth daily before breakfast.  06/12/16   Historical Provider, MD  Multiple Vitamins-Minerals (WOMENS 50+ MULTI VITAMIN/MIN) TABS Take 1 tablet by mouth daily.    Historical Provider, MD  nitroGLYCERIN (NITROSTAT) 0.4 MG SL tablet Place 0.4 mg under the tongue every 5 (five) minutes as needed for chest pain (x 3 doses). For chest pain    Historical Provider, MD  NOVOLOG FLEXPEN 100 UNIT/ML FlexPen 70 -199 TAKE 12 UNITS, 200-299 TAKE 14 UNITS, 300 OR HIGHER 16 UNITS SUBCUTANEOUS (12-16 units subcutaneously three (3) times a day) 10/01/15   Historical Provider, MD  OXYGEN-HELIUM IN Inhale 4 L into the lungs at bedtime.     Historical Provider, MD  Probiotic Product (PROBIOTIC PO) Take 1 tablet by mouth 2 (two) times daily.    Historical  Provider, MD  simvastatin (ZOCOR) 40 MG tablet Take 40 mg by mouth every evening.  11/28/11   Danley DankerBradley W Wainright, MD  torsemide (DEMADEX) 20 MG tablet Take 4 tablets (80 mg total) by mouth as directed. Every mon, wed and fri take 4 tabs = 80 mg  daily; all other days 3 tabs = 60 mg daily 05/20/16   [name], PA-C    Family History Family History  Problem Relation Age of Onset  . Hyperlipidemia Mother   . Other Mother     AAA  . Alzheimer's disease Mother   . Heart disease Mother   . Irregular heart beat Mother   . Diabetes Daughter   . Hypertension Daughter     Social History Social History  Substance Use Topics  . Smoking status: Former Smoker    Packs/day: 1.00    Years: 50.00    Types: Cigarettes    Quit date: 01/24/2012  . Smokeless tobacco: Never Used  . Alcohol use No     Allergies   Aldactone [spironolactone]; Lisinopril; Crestor [rosuvastatin calcium]; and Vicodin [hydrocodone-acetaminophen]   Review of Systems Review of Systems  Constitutional: Negative for chills and fever.  HENT: Negative for congestion.   Eyes: Negative for visual disturbance.  Respiratory: Negative for shortness of breath.   Cardiovascular: Negative for chest pain.  Gastrointestinal: Positive for blood in stool. Negative for nausea and vomiting.  Genitourinary: Negative for difficulty urinating and dysuria.  Neurological: Negative for dizziness and weakness.     Physical Exam Updated Vital Signs BP 110/55 (BP Location: Right Arm)   Pulse 70   Temp 97.7 F (36.5 C) (Oral)   Resp 12   SpO2 94%   Physical Exam  Constitutional: She is oriented to person, place, and time. She appears well-developed and well-nourished.  HENT:  Head: Normocephalic and atraumatic.  Eyes: Conjunctivae are normal. Right eye exhibits no discharge. Left eye exhibits no discharge.  Neck: Normal range of motion.  Cardiovascular: Normal rate, regular rhythm, normal heart sounds and intact distal pulses.     Pulmonary/Chest: Effort normal and breath sounds normal.  Abdominal: Soft. Bowel sounds are normal.  Genitourinary:  Genitourinary Comments: Gross bright red blood per rectum, no external hemorrhoids noted   Musculoskeletal: Normal range of motion.  Lymphadenopathy:    She has no cervical adenopathy.  Neurological: She is alert and oriented to person, place, and time.  Skin: Skin is warm. Capillary refill takes less than 2 seconds.     ED Treatments / Results  Labs (all labs ordered are listed, but only abnormal results are displayed) Labs Reviewed  LIPASE, BLOOD  COMPREHENSIVE METABOLIC PANEL  CBC  URINALYSIS, ROUTINE W REFLEX MICROSCOPIC (NOT AT [facility])    EKG  EKG Interpretation None       Radiology No results found.  Procedures Procedures (including critical care time)  Medications Ordered in ED Medications - No data to display   Initial Impression / Assessment and Plan / ED Course  I have reviewed the triage vital signs and the nursing notes.  Pertinent labs & imaging results that were available during my care of the patient were reviewed by me and considered in my medical decision making (see chart for details).  Clinical Course   Patient presenting with bright red blood per rectum. Hemoglobin was stable from previous at 10.9. BUN/Creatinine ratio of 34, indicative of lower GI bleed. Colonoscopy in 2016 showing a few external hemorrhoids, no bleeding noted. New onset rectal bleeding, will admit to triad service and consult GI   Final Clinical Impressions(s) / ED Diagnoses   Final diagnoses:  None    New Prescriptions New Prescriptions   No medications on file     Graceann Boileau Mayra Reel, MD 06/28/16 1409    Cathren Laine, MD  06/28/16 1523  

## 2016-06-28 NOTE — Care Management Note (Addendum)
Case Management Note  Patient Details  Name: [name] MRN: [id number] Date of Birth: 09/23/1946  Subjective/Objective:      Presents  with bloody bowel movements, history significant of MI/CAD s/p CABG, anemia, HLD, COPD, HTN, diabetes, GERD, hypothyroidism, PAD, CVA, s/p trach with home oxygen. Resides with daughter. Owns cane and walker. Affiliated with [facility]. Pt states she receives a visit once a month from [facility], commissioning( RN).  PCP: Laurann Montanaynthia White    Action/Plan: Return to home when medically stable. CM to f/u with disposition needs.  Expected Discharge Date:   04/01/2016            Expected Discharge Plan:  Home w Home Health Services  In-House Referral:     Discharge planning Services     Post Acute Care Choice:  Resumption of Svcs/PTA Provider Choice offered to:  Patient  DME Arranged:  Oxygen (uses 4l/min, oxygen) DME Agency:  [facility]  [facility]   [facility]   [facility]/ referral made with Lupita LeashDonna [phone]  Status of Service:  In process, will continue to follow  If discussed at Long Length of Stay Meetings, dates discussed:    Additional Comments:  Epifanio LeschesCole, Capria Cartaya Hudson, RN 06/28/2016, 4:19 PM

## 2016-06-28 NOTE — Care Management Obs Status (Signed)
MEDICARE OBSERVATION STATUS NOTIFICATION   Patient Details  Name: [name] MRN: [id number] Date of Birth: 01/30/1946   Medicare Observation Status Notification Given:       Epifanio LeschesCole, Madlyn Crosby Hudson, RN 06/28/2016, 4:47 PM

## 2016-06-28 NOTE — ED Notes (Signed)
Phlebotomy at bedside.

## 2016-06-28 NOTE — ED Triage Notes (Signed)
Pt arrives EMS with c/o gi bleed this AM dark red blood. Pt fell last week and was seen here for same. Pt states pain began Friday at left lower quad and left hip. Hx of GI bleed last year. Bruising noted left breast left side.

## 2016-06-28 NOTE — H&P (Signed)
History and Physical    [name] ZOX:[id number] DOB: [birth year] DOA: 06/28/2016  PCP: Cala Bradford, MD Patient coming from: home  Chief Complaint: bloody stool  HPI: [name] is a 70 y.o. female with medical history significant of MI/CAD s/p CABG, anemia, HLD, COPD, HTN, diabetes, GERD, hypothyroidism, PAD, CVA presenting with multiple bouts of bloody bowel movements. Patient states that she woke this morning to find blood in the bed. Shortly thereafter she developed some mild abdominal discomfort and urge to have a bowel movement. Patient had 3 dark bloody bowel movements at home with clots in the toilet. Very little stool type matter. Denies any NSAID use the patient is on Plavix. Colonoscopy 1 year ago with diverticular disease noted. Last GI bleed approximately one year ago. Patient denies any chest pain, palpitations, shortness of breath, nausea, vomiting, hematemesis, fevers, dysuria, frequency. Patient also endorses approximately two-week history of left flank pain. Started approximately 1-2 days after falling. This pain is intermittent area mild. Worse with certain movements. Improves with rest. Relieved with Tylenol.  ED Course: Objective findings outlined below. [facility] called to evaluate patient. Consult pending. IV fluids started.  Review of Systems: As per HPI otherwise 10 point review of systems negative.   Ambulatory Status: no restrictions  Past Medical History:  Diagnosis Date  . Anemia   . CAD (coronary artery disease)    a. S/p CABG and stenting // b. Myoview 3/16: high risk EF 25%, ant-apical AK, ant scar with peri-infarct ischemia // c. LHC 3/16 - EF 45%, inferior hypokinesis, oLAD 100, oLCx 40, branch vessel 50, pRCA 100 dRCA , RV br 90, S-RCA occluded, S-LAD patent with inf-apical LAD beyond graft 70 >> med Rx  . Carotid artery disease (HCC)    a. s/p L CEA // b. Carotid US 4/16: R 40-59%; L CEA patent with 40-59%  . Chronic combined systolic and  diastolic CHF (congestive heart failure) (HCC)    a. 11/2011 Echo with EF 30-35%, global hypokinesis, and inferior akinesis;  b. 12/2014 Echo: EF 50%, mod LVH, Ao sclerosis w/o stenosis, mildly dil LA, mild to mod RV dysfxn. // c. Echo 10/16: Moderate LVH, EF 35-40%, inferior AK, grade 1 diastolic dysfunction, MAC, mild MR, moderate LAE  . CKD (chronic kidney disease), stage III    stage 3  . COPD (chronic obstructive pulmonary disease) (HCC)    a. prn and HS supplemental O2.  . DDD (degenerative disc disease), lumbar   . Diabetes mellitus    on Lantus 30 U   . GERD (gastroesophageal reflux disease)   . History of IBS   . History of tracheostomy (HCC) 08/26/2013   Dr. Jenne Pane  . Hyperlipidemia   . Hypertension   . Hypothyroidism    Goiter  . MI (myocardial infarction) (HCC) 1997  . PAD (peripheral artery disease) (HCC)   . Stroke [facility])    MRI 11/2011 with remote occipital lobe. MRA with moderate left focal vertebral artery stenosis    Past Surgical History:  Procedure Laterality Date  . ABDOMINAL AORTAGRAM N/A 04/05/2013   Procedure: ABDOMINAL Ronny Flurry;  Surgeon: Sherren Kerns, MD;  Location: [facility] CATH LAB;  Service: Cardiovascular;  Laterality: N/A;  . [id number]  [id number]   Aortogram by Dr. Italy McKenzie [facility])  . CAROTID ENDARTERECTOMY  ~2008   Left   . CARPAL TUNNEL RELEASE Right   . CHOLECYSTECTOMY    . COLONOSCOPY N/A 10/27/2014   Procedure: COLONOSCOPY;  Surgeon: Willis Modena, MD;  Location: [facility] ENDOSCOPY;  Service: Endoscopy;  Laterality: N/A;  . CORONARY ARTERY BYPASS GRAFT     2 vessel  . FEMORAL-TIBIAL BYPASS GRAFT Left 06/11/2013   Procedure: BYPASS GRAFT  LEFT FEMORAL- POSTERIOR TIBIAL ARTERY/ REDO;  Surgeon: Sherren Kerns, MD;  Location: [facility] OR;  Service: Vascular;  Laterality: Left;  . LEFT AND RIGHT HEART CATHETERIZATION WITH CORONARY ANGIOGRAM N/A 01/09/2015   Procedure: LEFT AND RIGHT HEART CATHETERIZATION WITH CORONARY ANGIOGRAM;  Surgeon: Marykay Lex, MD;  Location: [facility] CATH LAB;  Service: Cardiovascular;  Laterality: N/A;  . PR VEIN BYPASS GRAFT,AORTO-FEM-POP     Right common femoral-AK popliteal BPG & Right Popliteal-posterior tibial  . PR VEIN BYPASS GRAFT,AORTO-FEM-POP     Left Fem-pop BPG  . PTCA    . SPINE SURGERY  Oct. 27, 2014   Injection - Back  . THROMBECTOMY FEMORAL ARTERY Left 06/11/2013   Procedure: THROMBECTOMY FEMORAL ARTERY;  Surgeon: Sherren Kerns, MD;  Location: [facility] OR;  Service: Vascular;  Laterality: Left;  . THYROIDECTOMY    . TRACHEOSTOMY TUBE PLACEMENT  01/02/2012    Social History   Social History  . Marital status: Single    Spouse name: N/A  . Number of children: 3  . Years of education: N/A   Occupational History  . Retired    Social History Main Topics  . Smoking status: Former Smoker    Packs/day: 1.00    Years: 50.00    Types: Cigarettes    Quit date: 01/24/2012  . Smokeless tobacco: Never Used  . Alcohol use No  . Drug use: No  . Sexual activity: No   Other Topics Concern  . Not on file   Social History Narrative   Retired Advertising account executive.  Worked at the [employer] on [employer].  Formerly lived in [place] area.  2 daughters, Living here with her daughter in [place] starting end of January 2013. [name] [phone] [phone]. Ambulatory previously with a cane, but none now.    Allergies  Allergen Reactions  . Aldactone [Spironolactone] Other (See Comments)    Severe hyperkalemia   . Lisinopril Other (See Comments) and Cough    Hypotension also  . Crestor [Rosuvastatin Calcium] Other (See Comments)    Muscle Pain  . Vicodin [Hydrocodone-Acetaminophen] Nausea And Vomiting    Family History  Problem Relation Age of Onset  . Hyperlipidemia Mother   . Other Mother     AAA  . Alzheimer's disease Mother   . Heart disease Mother   . Irregular heart beat Mother   . Diabetes Daughter   . Hypertension Daughter     Prior to Admission medications   Medication Sig  Start Date End Date Taking? Authorizing Provider  acetaminophen (TYLENOL) 500 MG tablet Take 1,000 mg by mouth daily as needed for mild pain or headache.     Historical Provider, MD  aspirin EC 81 MG tablet Take 81 mg by mouth daily.    Historical Provider, MD  carvedilol (COREG) 12.5 MG tablet Take 1 tablet (12.5 mg total) by mouth 2 (two) times daily with a meal. 03/29/16   [name], MD  clopidogrel (PLAVIX) 75 MG tablet Take 75 mg by mouth daily. 11/28/11   Danley Danker, MD  dicyclomine (BENTYL) 20 MG tablet Take 20 mg by mouth 3 (three) times daily before meals.    Historical Provider, MD  DULoxetine (CYMBALTA) 60 MG capsule Take 60 mg by mouth at bedtime.  08/06/14  Historical Provider, MD  ferrous sulfate 325 (65 FE) MG tablet Take 325 mg by mouth daily with breakfast.    Historical Provider, MD  gabapentin (NEURONTIN) 300 MG capsule Take 1 capsule (300 mg total) by mouth at bedtime. 03/29/16   [name], DPM  glucose ([facility] GLUCOSE) 4 GM chewable tablet Chew 1 tablet by mouth as needed for low blood sugar.    Historical Provider, MD  hydrALAZINE (APRESOLINE) 25 MG tablet Take 1 tablet (25 mg total) by mouth 3 (three) times daily. 04/11/16   [name], PA-C  HYDROcodone-acetaminophen (NORCO) 10-325 MG tablet Take 0.5-1 tablets by mouth every 6 (six) hours as needed for moderate pain.  05/12/16   Historical Provider, MD  Insulin Degludec (TRESIBA FLEXTOUCH) 100 UNIT/ML SOPN Inject 22 Units into the skin at bedtime.     Historical Provider, MD  ipratropium-albuterol (DUONEB) 0.5-2.5 (3) MG/3ML SOLN Take 3 mLs by nebulization 2 (two) times daily as needed (for shortnes of breath).     Historical Provider, MD  isosorbide mononitrate (IMDUR) 30 MG 24 hr tablet Take 15 mg by mouth daily.    Historical Provider, MD  lansoprazole (PREVACID) 30 MG capsule Take 30 mg by mouth 2 (two) times daily. 06/08/16   Historical Provider, MD  levothyroxine (SYNTHROID, LEVOTHROID) 75 MCG tablet Take  75 mcg by mouth daily before breakfast.  06/12/16   Historical Provider, MD  Multiple Vitamins-Minerals (WOMENS 50+ MULTI VITAMIN/MIN) TABS Take 1 tablet by mouth daily.    Historical Provider, MD  nitroGLYCERIN (NITROSTAT) 0.4 MG SL tablet Place 0.4 mg under the tongue every 5 (five) minutes as needed for chest pain (x 3 doses). For chest pain    Historical Provider, MD  NOVOLOG FLEXPEN 100 UNIT/ML FlexPen 70 -199 TAKE 12 UNITS, 200-299 TAKE 14 UNITS, 300 OR HIGHER 16 UNITS SUBCUTANEOUS (12-16 units subcutaneously three (3) times a day) 10/01/15   Historical Provider, MD  OXYGEN-HELIUM IN Inhale 4 L into the lungs at bedtime.     Historical Provider, MD  Probiotic Product (PROBIOTIC PO) Take 1 tablet by mouth 2 (two) times daily.    Historical Provider, MD  simvastatin (ZOCOR) 40 MG tablet Take 40 mg by mouth every evening.  11/28/11   Danley Danker, MD  torsemide (DEMADEX) 20 MG tablet Take 4 tablets (80 mg total) by mouth as directed. Every mon, wed and fri take 4 tabs = 80 mg daily; all other days 3 tabs = 60 mg daily 05/20/16   [name], PA-C    Physical Exam: Vitals:   06/28/16 1047 06/28/16 1100 06/28/16 1230 06/28/16 1300  BP: 110/55 142/83 139/76 137/83  Pulse: 70 73 70 71  Resp: 12 14 19 16   Temp: 97.7 F (36.5 C)     TempSrc: Oral     SpO2: 94% 93% 92% 96%     General:  Appears calm and comfortable Eyes:  PERRL, EOMI, normal lids, iris ENT:  grossly normal hearing, permanent trach in place Neck:  no LAD, masses or thyromegaly Cardiovascular:  RRR, no m/r/g. No LE edema.  Respiratory:  CTA bilaterally, no w/r/r. Normal respiratory effort. Abdomen:  soft, ntnd, NABS Skin:  no rash or induration seen on limited exam Musculoskeletal:  Mild intermittent left superficial flank discomfort with palpation. No CVA tenderness bilaterally. grossly normal tone BUE/BLE, good ROM, no bony abnormality Psychiatric:  grossly normal mood and affect, speech fluent and appropriate,  AOx3 Neurologic:  CN 2-12 grossly intact, moves all extremities  in coordinated fashion, sensation intact  Labs on Admission: I have personally reviewed following labs and imaging studies  CBC:  Recent Labs Lab 06/28/16 1051  WBC 4.9  HGB 10.9*  HCT 35.7*  MCV 95.2  PLT 144*   Basic Metabolic Panel:  Recent Labs Lab 06/28/16 1051  NA 144  K 4.8  CL 108  CO2 29  GLUCOSE 99  BUN 56*  CREATININE 1.62*  CALCIUM 8.7*   GFR: CrCl cannot be calculated (Unknown ideal weight.). Liver Function Tests:  Recent Labs Lab 06/28/16 1051  AST 27  ALT 31  ALKPHOS 73  BILITOT 0.5  PROT 6.1*  ALBUMIN 3.1*    Recent Labs Lab 06/28/16 1051  LIPASE 24   No results for input(s): AMMONIA in the last 168 hours. Coagulation Profile: No results for input(s): INR, PROTIME in the last 168 hours. Cardiac Enzymes: No results for input(s): CKTOTAL, CKMB, CKMBINDEX, TROPONINI in the last 168 hours. BNP (last 3 results) No results for input(s): PROBNP in the last 8760 hours. HbA1C: No results for input(s): HGBA1C in the last 72 hours. CBG: No results for input(s): GLUCAP in the last 168 hours. Lipid Profile: No results for input(s): CHOL, HDL, LDLCALC, TRIG, CHOLHDL, LDLDIRECT in the last 72 hours. Thyroid Function Tests: No results for input(s): TSH, T4TOTAL, FREET4, T3FREE, THYROIDAB in the last 72 hours. Anemia Panel: No results for input(s): VITAMINB12, FOLATE, FERRITIN, TIBC, IRON, RETICCTPCT in the last 72 hours. Urine analysis:    Component Value Date/Time   COLORURINE STRAW (A) 07/27/2015 1650   APPEARANCEUR HAZY (A) 07/27/2015 1650   LABSPEC 1.010 07/27/2015 1650   PHURINE 5.0 07/27/2015 1650   GLUCOSEU 500 (A) 07/27/2015 1650   HGBUR TRACE (A) 07/27/2015 1650   BILIRUBINUR NEGATIVE 07/27/2015 1650   KETONESUR NEGATIVE 07/27/2015 1650   PROTEINUR NEGATIVE 07/27/2015 1650   UROBILINOGEN 0.2 07/27/2015 1650   NITRITE NEGATIVE 07/27/2015 1650   LEUKOCYTESUR TRACE  (A) 07/27/2015 1650    Creatinine Clearance: CrCl cannot be calculated (Unknown ideal weight.).  Sepsis Labs: @LABRCNTIP (procalcitonin:4,lacticidven:4) )No results found for this or any previous visit (from the past 240 hour(s)).   Radiological Exams on Admission: No results found.   Assessment/Plan Principal Problem:   GI bleed Active Problems:   History of MI (myocardial infarction)   CAD (coronary artery disease)   Hypertension   Abnormal EKG   Chronic combined systolic and diastolic CHF (congestive heart failure) (HCC)   Chronic respiratory failure with hypoxia (HCC)   Chronic kidney disease, stage 3    GI bleed: Suspect lower GI bleed, likely from hemorrhoids versus diverticulum. Hemodynamically stable. Hemoglobin 10.8 which is above baseline. There are some component of hemoconcentration. Followed by [facility], who has been consult by EDP. On Plavix. Last colonoscopy approximately 1 year ago per report which was normal other than diverticulosis. - Observation, telemetry - Follow-up GI recommendations - Clear liquid diet - CBC every 8 - PRBC transfusion per EDP.  GERD:  - continue Prevacid  Chronic combined systolic and diastolic congestive heart failure: Last echo showing EF 35% grade 1 diastolic dysfunction. No evidence of the compensation. - Strict I's and O's, daily weights - Continue torsemide, beta blocker  CAD/CABG/MI:  - continue ASA, Statin - Hold Plavix during GI bleed - Trop, EKG  CKD: Cr 1.6. At baseline - BMP in am.   HTN: normotensive.  - continue coreg,  Imdur - Continue Hydralazine  Hypothyroid: - continue synthroid  Peripheral Neuropathy: - continue neurontin  IBS: - continue bentyl, probiotics  Depression: - continue cymbalta   DVT prophylaxis: scd  Code Status: full  Family Communication: daughter  Disposition Plan: pending resolution of GI bleed  Consults called: GI  Admission status: observation.    Chaneka Trefz J  MD [facility]  If 7PM-7AM, please contact night-coverage [email or web address] Password [email or web address]  06/28/2016, 1:51 PM

## 2016-06-28 NOTE — ED Notes (Signed)
MD at bedside. 

## 2016-06-29 ENCOUNTER — Observation Stay (HOSPITAL_COMMUNITY): Payer: Medicare Other

## 2016-06-29 DIAGNOSIS — I130 Hypertensive heart and chronic kidney disease with heart failure and stage 1 through stage 4 chronic kidney disease, or unspecified chronic kidney disease: Secondary | ICD-10-CM | POA: Diagnosis not present

## 2016-06-29 DIAGNOSIS — W19XXXA Unspecified fall, initial encounter: Secondary | ICD-10-CM | POA: Diagnosis present

## 2016-06-29 DIAGNOSIS — E785 Hyperlipidemia, unspecified: Secondary | ICD-10-CM | POA: Diagnosis present

## 2016-06-29 DIAGNOSIS — Z930 Tracheostomy status: Secondary | ICD-10-CM

## 2016-06-29 DIAGNOSIS — K921 Melena: Secondary | ICD-10-CM | POA: Diagnosis present

## 2016-06-29 DIAGNOSIS — I5042 Chronic combined systolic (congestive) and diastolic (congestive) heart failure: Secondary | ICD-10-CM | POA: Diagnosis not present

## 2016-06-29 DIAGNOSIS — J9611 Chronic respiratory failure with hypoxia: Secondary | ICD-10-CM | POA: Diagnosis not present

## 2016-06-29 DIAGNOSIS — K922 Gastrointestinal hemorrhage, unspecified: Secondary | ICD-10-CM | POA: Diagnosis not present

## 2016-06-29 DIAGNOSIS — E1151 Type 2 diabetes mellitus with diabetic peripheral angiopathy without gangrene: Secondary | ICD-10-CM | POA: Diagnosis not present

## 2016-06-29 DIAGNOSIS — K219 Gastro-esophageal reflux disease without esophagitis: Secondary | ICD-10-CM | POA: Diagnosis present

## 2016-06-29 DIAGNOSIS — N183 Chronic kidney disease, stage 3 (moderate): Secondary | ICD-10-CM | POA: Diagnosis present

## 2016-06-29 DIAGNOSIS — I2510 Atherosclerotic heart disease of native coronary artery without angina pectoris: Secondary | ICD-10-CM | POA: Diagnosis present

## 2016-06-29 DIAGNOSIS — Z888 Allergy status to other drugs, medicaments and biological substances status: Secondary | ICD-10-CM | POA: Diagnosis not present

## 2016-06-29 DIAGNOSIS — E1165 Type 2 diabetes mellitus with hyperglycemia: Secondary | ICD-10-CM | POA: Diagnosis present

## 2016-06-29 DIAGNOSIS — J449 Chronic obstructive pulmonary disease, unspecified: Secondary | ICD-10-CM | POA: Diagnosis present

## 2016-06-29 DIAGNOSIS — Z9981 Dependence on supplemental oxygen: Secondary | ICD-10-CM | POA: Diagnosis not present

## 2016-06-29 DIAGNOSIS — I252 Old myocardial infarction: Secondary | ICD-10-CM | POA: Diagnosis not present

## 2016-06-29 DIAGNOSIS — D62 Acute posthemorrhagic anemia: Secondary | ICD-10-CM | POA: Diagnosis not present

## 2016-06-29 DIAGNOSIS — Z951 Presence of aortocoronary bypass graft: Secondary | ICD-10-CM | POA: Diagnosis not present

## 2016-06-29 DIAGNOSIS — E1122 Type 2 diabetes mellitus with diabetic chronic kidney disease: Secondary | ICD-10-CM | POA: Diagnosis not present

## 2016-06-29 DIAGNOSIS — E1142 Type 2 diabetes mellitus with diabetic polyneuropathy: Secondary | ICD-10-CM | POA: Diagnosis not present

## 2016-06-29 DIAGNOSIS — E039 Hypothyroidism, unspecified: Secondary | ICD-10-CM | POA: Diagnosis present

## 2016-06-29 DIAGNOSIS — R9431 Abnormal electrocardiogram [ECG] [EKG]: Secondary | ICD-10-CM | POA: Diagnosis not present

## 2016-06-29 DIAGNOSIS — Z8673 Personal history of transient ischemic attack (TIA), and cerebral infarction without residual deficits: Secondary | ICD-10-CM | POA: Diagnosis not present

## 2016-06-29 DIAGNOSIS — I472 Ventricular tachycardia: Secondary | ICD-10-CM | POA: Diagnosis not present

## 2016-06-29 DIAGNOSIS — Z87891 Personal history of nicotine dependence: Secondary | ICD-10-CM | POA: Diagnosis not present

## 2016-06-29 DIAGNOSIS — I959 Hypotension, unspecified: Secondary | ICD-10-CM | POA: Diagnosis not present

## 2016-06-29 DIAGNOSIS — K5791 Diverticulosis of intestine, part unspecified, without perforation or abscess with bleeding: Secondary | ICD-10-CM | POA: Diagnosis not present

## 2016-06-29 LAB — CBC
HEMATOCRIT: 31.8 % — AB (ref 36.0–46.0)
HEMATOCRIT: 28.8 % — AB (ref 36.0–46.0)
HEMATOCRIT: 30.5 % — AB (ref 36.0–46.0)
HEMOGLOBIN: 9.8 g/dL — AB (ref 12.0–15.0)
HEMOGLOBIN: 8.9 g/dL — AB (ref 12.0–15.0)
HEMOGLOBIN: 9.8 g/dL — AB (ref 12.0–15.0)
MCH: 28.4 pg (ref 26.0–34.0)
MCH: 28.3 pg (ref 26.0–34.0)
MCH: 28.9 pg (ref 26.0–34.0)
MCHC: 30.8 g/dL (ref 30.0–36.0)
MCHC: 30.9 g/dL (ref 30.0–36.0)
MCHC: 32.1 g/dL (ref 30.0–36.0)
MCV: 92.2 fL (ref 78.0–100.0)
MCV: 91.4 fL (ref 78.0–100.0)
MCV: 90 fL (ref 78.0–100.0)
Platelets: 127 10*3/uL — ABNORMAL LOW (ref 150–400)
Platelets: 121 10*3/uL — ABNORMAL LOW (ref 150–400)
Platelets: 112 10*3/uL — ABNORMAL LOW (ref 150–400)
RBC: 3.45 MIL/uL — ABNORMAL LOW (ref 3.87–5.11)
RBC: 3.15 MIL/uL — AB (ref 3.87–5.11)
RBC: 3.39 MIL/uL — ABNORMAL LOW (ref 3.87–5.11)
RDW: 16.4 % — ABNORMAL HIGH (ref 11.5–15.5)
RDW: 16 % — ABNORMAL HIGH (ref 11.5–15.5)
RDW: 15.5 % (ref 11.5–15.5)
WBC: 8.2 10*3/uL (ref 4.0–10.5)
WBC: 6.7 10*3/uL (ref 4.0–10.5)
WBC: 7.4 10*3/uL (ref 4.0–10.5)

## 2016-06-29 LAB — URINE MICROSCOPIC-ADD ON

## 2016-06-29 LAB — BASIC METABOLIC PANEL
ANION GAP: 11 (ref 5–15)
Anion gap: 11 (ref 5–15)
Anion gap: 7 (ref 5–15)
BUN: 73 mg/dL — ABNORMAL HIGH (ref 6–20)
BUN: 74 mg/dL — ABNORMAL HIGH (ref 6–20)
BUN: 76 mg/dL — AB (ref 6–20)
CALCIUM: 8.2 mg/dL — AB (ref 8.9–10.3)
CO2: 26 mmol/L (ref 22–32)
CO2: 27 mmol/L (ref 22–32)
CO2: 27 mmol/L (ref 22–32)
CREATININE: 2.06 mg/dL — AB (ref 0.44–1.00)
Calcium: 7.9 mg/dL — ABNORMAL LOW (ref 8.9–10.3)
Calcium: 7.9 mg/dL — ABNORMAL LOW (ref 8.9–10.3)
Chloride: 99 mmol/L — ABNORMAL LOW (ref 101–111)
Chloride: 99 mmol/L — ABNORMAL LOW (ref 101–111)
Chloride: 104 mmol/L (ref 101–111)
Creatinine, Ser: 2.05 mg/dL — ABNORMAL HIGH (ref 0.44–1.00)
Creatinine, Ser: 2.07 mg/dL — ABNORMAL HIGH (ref 0.44–1.00)
GFR calc Af Amer: 27 mL/min — ABNORMAL LOW (ref >60)
GFR calc Af Amer: 27 mL/min — ABNORMAL LOW (ref >60)
GFR calc non Af Amer: 23 mL/min — ABNORMAL LOW (ref >60)
GFR calc non Af Amer: 23 mL/min — ABNORMAL LOW (ref >60)
GFR, EST AFRICAN AMERICAN: 27 mL/min — AB (ref >60)
GFR, EST NON AFRICAN AMERICAN: 23 mL/min — AB (ref >60)
GLUCOSE: 128 mg/dL — AB (ref 65–99)
GLUCOSE: 266 mg/dL — AB (ref 65–99)
Glucose, Bld: 126 mg/dL — ABNORMAL HIGH (ref 65–99)
POTASSIUM: 6.9 mmol/L — AB (ref 3.5–5.1)
POTASSIUM: 5.1 mmol/L (ref 3.5–5.1)
Potassium: 6.2 mmol/L — ABNORMAL HIGH (ref 3.5–5.1)
SODIUM: 138 mmol/L (ref 135–145)
Sodium: 136 mmol/L (ref 135–145)
Sodium: 137 mmol/L (ref 135–145)

## 2016-06-29 LAB — URINALYSIS, ROUTINE W REFLEX MICROSCOPIC
BILIRUBIN URINE: NEGATIVE
Glucose, UA: NEGATIVE mg/dL
Ketones, ur: NEGATIVE mg/dL
NITRITE: NEGATIVE
PROTEIN: NEGATIVE mg/dL
SPECIFIC GRAVITY, URINE: 1.015 (ref 1.005–1.030)
pH: 5 (ref 5.0–8.0)

## 2016-06-29 LAB — PREPARE RBC (CROSSMATCH)

## 2016-06-29 LAB — GLUCOSE, CAPILLARY
GLUCOSE-CAPILLARY: 115 mg/dL — AB (ref 65–99)
GLUCOSE-CAPILLARY: 210 mg/dL — AB (ref 65–99)
GLUCOSE-CAPILLARY: 227 mg/dL — AB (ref 65–99)
GLUCOSE-CAPILLARY: 158 mg/dL — AB (ref 65–99)
GLUCOSE-CAPILLARY: 276 mg/dL — AB (ref 65–99)
Glucose-Capillary: 206 mg/dL — ABNORMAL HIGH (ref 65–99)

## 2016-06-29 LAB — POTASSIUM: Potassium: 5.5 mmol/L — ABNORMAL HIGH (ref 3.5–5.1)

## 2016-06-29 LAB — OCCULT BLOOD X 1 CARD TO LAB, STOOL: FECAL OCCULT BLD: POSITIVE — AB

## 2016-06-29 MED ORDER — SODIUM BICARBONATE 8.4 % IV SOLN
50.0000 meq | Freq: Once | INTRAVENOUS | Status: AC
  Administered 2016-06-29: 50 meq via INTRAVENOUS
  Filled 2016-06-29: qty 50

## 2016-06-29 MED ORDER — DEXTROSE 50 % IV SOLN
25.0000 mL | Freq: Once | INTRAVENOUS | Status: DC
  Filled 2016-06-29: qty 50

## 2016-06-29 MED ORDER — CARVEDILOL 6.25 MG PO TABS
6.2500 mg | ORAL_TABLET | Freq: Two times a day (BID) | ORAL | Status: DC
  Administered 2016-06-29 – 2016-06-30 (×2): 6.25 mg via ORAL
  Filled 2016-06-29 (×2): qty 1

## 2016-06-29 MED ORDER — SODIUM POLYSTYRENE SULFONATE 15 GM/60ML PO SUSP
15.0000 g | Freq: Once | ORAL | Status: AC
  Administered 2016-06-29: 15 g via ORAL
  Filled 2016-06-29: qty 60

## 2016-06-29 MED ORDER — SODIUM CHLORIDE 0.9 % IV SOLN
Freq: Once | INTRAVENOUS | Status: AC
  Administered 2016-06-29: 14:00:00 via INTRAVENOUS

## 2016-06-29 MED ORDER — SODIUM CHLORIDE 0.9 % IV SOLN: Freq: Once | INTRAVENOUS | Status: DC

## 2016-06-29 MED ORDER — INSULIN NPH (HUMAN) (ISOPHANE) 100 UNIT/ML ~~LOC~~ SUSP
10.0000 [IU] | Freq: Once | SUBCUTANEOUS | Status: DC
  Filled 2016-06-29: qty 10

## 2016-06-29 MED ORDER — INSULIN DEGLUDEC 100 UNIT/ML ~~LOC~~ SOPN
22.0000 [IU] | PEN_INJECTOR | Freq: Every day | SUBCUTANEOUS | Status: DC
  Administered 2016-06-29: 22 [IU] via SUBCUTANEOUS
  Filled 2016-06-29: qty 1

## 2016-06-29 MED ORDER — IOPAMIDOL (ISOVUE-300) INJECTION 61%
15.0000 mL | INTRAVENOUS | Status: AC
  Administered 2016-06-29 (×2): 15 mL via ORAL

## 2016-06-29 NOTE — Progress Notes (Signed)
Chaplain attempted times two for spiritual care visitation, Chaplain will follow up at a later time. Chaplain Janell QuietAudrey Demontrez Rindfleisch [phone]

## 2016-06-29 NOTE — Progress Notes (Addendum)
CRITICAL VALUE ALERT  Critical value received: k 6.9  Date of notification:  06/29/16   Time of notification:  0600  Critical value read back:yes  Nurse who received alert:  Lowanda FosterPam Sava Proby  MD notified (1st page):  Merdis DelayK. Schorr MD  Time of first page:  0600  MD notified (2nd page):[phone]  Time of second page:  Responding MD:  K.Schorr MD Time MD responded:  [phone]

## 2016-06-29 NOTE — Progress Notes (Signed)
[facility] Progress Note  Subjective: No specific complaints. Had 3 bloody bowel movements yesterday, none since 10 PM.Objective: Vital signs in last 24 hours: Temp:  [97.3 F (36.3 [name])-97.7 F (36.5 [name])] 97.3 F (36.3 [name]) (09/06 0528) Pulse Rate:  [60-73] 72 (09/06 0528) Resp:  [12-19] 18 (09/06 0528) BP: (86-142)/(47-90) 110/53 (09/06 0528) SpO2:  [92 %-100 %] 100 % (09/06 0528) Weight:  [72.7 kg (160 lb 3.2 oz)] 72.7 kg (160 lb 3.2 oz) (09/05 1419) Weight change:    PE: Unchanged  Lab Results: Results for orders placed or performed during the hospital encounter of 06/28/16 (from the past 24 hour(s))  Lipase, blood     Status: None   Collection Time: 06/28/16 10:51 AM  Result Value Ref Range   Lipase 24 11 - 51 U/L  Comprehensive metabolic panel     Status: Abnormal   Collection Time: 06/28/16 10:51 AM  Result Value Ref Range   Sodium 144 135 - 145 mmol/L   Potassium 4.8 3.5 - 5.1 mmol/L   Chloride 108 101 - 111 mmol/L   CO2 29 22 - 32 mmol/L   Glucose, Bld 99 65 - 99 mg/dL   BUN 56 (H) 6 - 20 mg/dL   Creatinine, Ser 0.981.62 (H) 0.44 - 1.00 mg/dL   Calcium 8.7 (L) 8.9 - 10.3 mg/dL   Total Protein 6.1 (L) 6.5 - 8.1 g/dL   Albumin 3.1 (L) 3.5 - 5.0 g/dL   AST 27 15 - 41 U/L   ALT 31 14 - 54 U/L   Alkaline Phosphatase 73 38 - 126 U/L   Total Bilirubin 0.5 0.3 - 1.2 mg/dL   GFR calc non Af Amer 31 (L) >60 mL/min   GFR calc Af Amer 36 (L) >60 mL/min   Anion gap 7 5 - 15  CBC     Status: Abnormal   Collection Time: 06/28/16 10:51 AM  Result Value Ref Range   WBC 4.9 4.0 - 10.5 K/uL   RBC 3.75 (L) 3.87 - 5.11 MIL/uL   Hemoglobin 10.9 (L) 12.0 - 15.0 g/dL   HCT 11.935.7 (L) 14.736.0 - 82.946.0 %   MCV 95.2 78.0 - 100.0 fL   MCH 29.1 26.0 - 34.0 pg   MCHC 30.5 30.0 - 36.0 g/dL   RDW 56.215.3 13.011.5 - 86.515.5 %   Platelets 144 (L) 150 - 400 K/uL  Type and screen  [facility]     Status: None   Collection Time: 06/28/16 12:16 PM  Result Value Ref Range   ABO/RH(D)  O POS    Antibody Screen NEG    Sample Expiration 07/01/2016    Unit Number [id number]    Blood Component Type RED CELLS,LR    Unit division 00    Status of Unit ISSUED,FINAL    Transfusion Status OK TO TRANSFUSE    Crossmatch Result Compatible   Prepare RBC     Status: None   Collection Time: 06/28/16 12:17 PM  Result Value Ref Range   Order Confirmation ORDER PROCESSED BY BLOOD BANK   Glucose, capillary     Status: None   Collection Time: 06/28/16  5:40 PM  Result Value Ref Range   Glucose-Capillary 78 65 - 99 mg/dL  CBC     Status: Abnormal   Collection Time: 06/28/16  8:10 PM  Result Value Ref Range   WBC 7.5 4.0 - 10.5 K/uL   RBC 3.68 (L) 3.87 - 5.11 MIL/uL   Hemoglobin 10.4 (L) 12.0 -  15.0 g/dL   HCT 16.1 (L) 09.6 - 04.5 %   MCV 92.1 78.0 - 100.0 fL   MCH 28.3 26.0 - 34.0 pg   MCHC 30.7 30.0 - 36.0 g/dL   RDW 40.9 (H) 81.1 - 91.4 %   Platelets 130 (L) 150 - 400 K/uL  Glucose, capillary     Status: Abnormal   Collection Time: 06/28/16  8:40 PM  Result Value Ref Range   Glucose-Capillary 117 (H) 65 - 99 mg/dL  CBC     Status: Abnormal   Collection Time: 06/29/16  4:57 AM  Result Value Ref Range   WBC 8.2 4.0 - 10.5 K/uL   RBC 3.45 (L) 3.87 - 5.11 MIL/uL   Hemoglobin 9.8 (L) 12.0 - 15.0 g/dL   HCT 78.2 (L) 95.6 - 21.3 %   MCV 92.2 78.0 - 100.0 fL   MCH 28.4 26.0 - 34.0 pg   MCHC 30.8 30.0 - 36.0 g/dL   RDW 08.6 (H) 57.8 - 46.9 %   Platelets 127 (L) 150 - 400 K/uL  Basic metabolic panel     Status: Abnormal   Collection Time: 06/29/16  4:57 AM  Result Value Ref Range   Sodium 136 135 - 145 mmol/L   Potassium 6.9 (HH) 3.5 - 5.1 mmol/L   Chloride 99 (L) 101 - 111 mmol/L   CO2 26 22 - 32 mmol/L   Glucose, Bld 128 (H) 65 - 99 mg/dL   BUN 73 (H) 6 - 20 mg/dL   Creatinine, Ser 6.29 (H) 0.44 - 1.00 mg/dL   Calcium 7.9 (L) 8.9 - 10.3 mg/dL   GFR calc non Af Amer 23 (L) >60 mL/min   GFR calc Af Amer 27 (L) >60 mL/min   Anion gap 11 5 - 15  Basic metabolic panel      Status: Abnormal   Collection Time: 06/29/16  6:09 AM  Result Value Ref Range   Sodium 137 135 - 145 mmol/L   Potassium 6.2 (H) 3.5 - 5.1 mmol/L   Chloride 99 (L) 101 - 111 mmol/L   CO2 27 22 - 32 mmol/L   Glucose, Bld 126 (H) 65 - 99 mg/dL   BUN 74 (H) 6 - 20 mg/dL   Creatinine, Ser 5.28 (H) 0.44 - 1.00 mg/dL   Calcium 7.9 (L) 8.9 - 10.3 mg/dL   GFR calc non Af Amer 23 (L) >60 mL/min   GFR calc Af Amer 27 (L) >60 mL/min   Anion gap 11 5 - 15    Studies/Results: No results found.    Assessment: Lower GI bleed, probably diverticular, exacerbated by Plavix. Hemodynamically stable.  Plan: Continue to hold Plavix Monitor stools and hemoglobin Will allow solid food today.    [name] 06/29/2016, 7:58 AM  Pager [phone] If no answer or after 5 PM call [phone]

## 2016-06-29 NOTE — Progress Notes (Addendum)
PROGRESS NOTE  [name] ZOX:[id number] DOB: 12/27/1945 DOA: 06/28/2016 PCP: Cala Bradford, MD  HPI/Recap of past 24 hours:  Had two episode of bloody bm this am, report feeling dizzy standing up, no n/v, no fever, report persistent left lower quadrant pain, left hip pain,  Assessment/Plan: Principal Problem:   GI bleed Active Problems:   History of MI (myocardial infarction)   CAD (coronary artery disease)   Hypertension   Abnormal EKG   Chronic combined systolic and diastolic CHF (congestive heart failure) (HCC)   Chronic respiratory failure with hypoxia (HCC)   Chronic kidney disease, stage 3  GI bleed: with left lower quadrant pain, possible diverticula bleed, plavix held, [facility] following, diet per gi  Acute blood loss anemia: prbc transfusion.  Hyperk: unclear etiology, on acute ekg changes, continue tele, s/p shift with bicarb/insulin. Repeat k better, continue on demadex  Cr elevation: possibly from gi bleed , ua pending, renal dosing meds. 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   Code Status: full  Family Communication: patient   Disposition Plan: remain in the hospital   Consultants:  [facility]  Procedures:  none  Antibiotics:  none   Objective: BP (!) 110/53 (BP Location: Left Arm)   Pulse 72   Temp 97.3 F (36.3 C) (Oral)   Resp 18   Ht 5\' 2"  (1.575 m)   Wt 72.7 kg (160 lb 3.2 oz)   SpO2 100%   BMI 29.30 kg/m   Intake/Output Summary (Last 24 hours) at 06/29/16 0814 Last data filed at 06/29/16 0233  Gross per 24 hour  Intake              626 ml  Output                0 ml  Net              626 ml   Filed Weights   06/28/16 1419  Weight: 72.7 kg (160 lb 3.2 oz)    Exam:   General:  NAD, + trach  Cardiovascular: RRR  Respiratory: CTABL  Abdomen: Soft/ND/NT, positive BS  Musculoskeletal: left lower quadrant abdominal tenderness vs left hip tenderness  Neuro: aaox3  Data Reviewed: Basic Metabolic Panel:  Recent Labs Lab 06/28/16 1051 06/29/16 0457 06/29/16 0609  NA 144 136 137  K 4.8 6.9* 6.2*  CL 108 99* 99*  CO2 29 26 27   GLUCOSE 99 128* 126*  BUN 56* 73* 74*  CREATININE 1.62* 2.05* 2.07*  CALCIUM 8.7* 7.9* 7.9*   Liver Function Tests:  Recent Labs Lab 06/28/16 1051  AST  27  ALT 31  ALKPHOS 73  BILITOT 0.5  PROT 6.1*  ALBUMIN 3.1*    Recent Labs Lab 06/28/16 1051  LIPASE 24   No results for input(s): AMMONIA in the last 168 hours. CBC:  Recent Labs Lab 06/28/16 1051 06/28/16 2010 06/29/16 0457  WBC 4.9 7.5 8.2  HGB 10.9* 10.4* 9.8*  HCT 35.7* 33.9* 31.8*  MCV 95.2 92.1 92.2  PLT 144* 130* 127*   Cardiac Enzymes:   No results for input(s): CKTOTAL, CKMB, CKMBINDEX, TROPONINI in the last 168 hours. BNP (last 3 results)  Recent Labs  11/17/15 0750 04/11/16 1522 05/27/16 1020  BNP 982.1* 623.6* 904.6*    ProBNP (last 3 results) No results for input(s): PROBNP in the last 8760 hours.  CBG:  Recent Labs Lab 06/28/16 1740 06/28/16 2040  GLUCAP 78 117*    No  results found for this or any previous visit (from the past 240 hour(s)).   Studies: No results found.  Scheduled Meds: . acidophilus   Oral BID  . aspirin EC  81 mg Oral Daily  . carvedilol  12.5 mg Oral BID WC  . dextrose  25 mL Intravenous Once  . dicyclomine  20 mg Oral TID AC  . DULoxetine  60 mg Oral QHS  . ferrous sulfate  325 mg Oral Q breakfast  . gabapentin  300 mg Oral QHS  . hydrALAZINE  25 mg Oral TID  . insulin aspart  0-5 Units Subcutaneous QHS  . insulin aspart  0-9 Units Subcutaneous TID WC  . Insulin Degludec  11 Units Subcutaneous QHS  . insulin NPH Human  10 Units Subcutaneous Once  . isosorbide mononitrate  15 mg Oral Daily  . levothyroxine  75 mcg Oral QAC breakfast  . pantoprazole  40 mg Oral Daily  . simvastatin  40 mg Oral QPM  . sodium bicarbonate  50 mEq Intravenous Once  . sodium polystyrene  15 g Oral Once  . [START ON 06/30/2016] torsemide  60 mg Oral Once per day on Sun Tue Thu Sat  . torsemide  80 mg Oral Once per day on Mon Wed Fri    Continuous Infusions:    Time spent: 35mins  Bianka Liberati MD, PhD  [facility] Pager [phone]. If 7PM-7AM, please contact night-coverage at [email or web address], password [email or web address] 06/29/2016, 8:14 AM  LOS: 0 days

## 2016-06-29 NOTE — Progress Notes (Signed)
Pt. Had two watery, bright red stools totaling . Pt. Reports being dizzy. Blood pressure was 91/26 with a pulse of 73. Pt. Was assisted back to bed and alarm set. Pt. Instructed to call before trying to get up without assistance. MD notified. Awaiting orders.

## 2016-06-30 DIAGNOSIS — I252 Old myocardial infarction: Secondary | ICD-10-CM

## 2016-06-30 LAB — TYPE AND SCREEN
ABO/RH(D): O POS
ANTIBODY SCREEN: NEGATIVE
UNIT DIVISION: 0
UNIT DIVISION: 0

## 2016-06-30 LAB — GLUCOSE, CAPILLARY
GLUCOSE-CAPILLARY: 300 mg/dL — AB (ref 65–99)
GLUCOSE-CAPILLARY: 287 mg/dL — AB (ref 65–99)
GLUCOSE-CAPILLARY: 74 mg/dL (ref 65–99)
Glucose-Capillary: 230 mg/dL — ABNORMAL HIGH (ref 65–99)
Glucose-Capillary: 117 mg/dL — ABNORMAL HIGH (ref 65–99)

## 2016-06-30 LAB — CBC
HCT: 29.5 % — ABNORMAL LOW (ref 36.0–46.0)
Hemoglobin: 9.5 g/dL — ABNORMAL LOW (ref 12.0–15.0)
MCH: 29.2 pg (ref 26.0–34.0)
MCHC: 32.2 g/dL (ref 30.0–36.0)
MCV: 90.8 fL (ref 78.0–100.0)
PLATELETS: 113 10*3/uL — AB (ref 150–400)
RBC: 3.25 MIL/uL — AB (ref 3.87–5.11)
RDW: 15.4 % (ref 11.5–15.5)
WBC: 7.4 10*3/uL (ref 4.0–10.5)

## 2016-06-30 LAB — BASIC METABOLIC PANEL
Anion gap: 8 (ref 5–15)
BUN: 72 mg/dL — ABNORMAL HIGH (ref 6–20)
CALCIUM: 7.9 mg/dL — AB (ref 8.9–10.3)
CO2: 28 mmol/L (ref 22–32)
CREATININE: 2.02 mg/dL — AB (ref 0.44–1.00)
Chloride: 98 mmol/L — ABNORMAL LOW (ref 101–111)
GFR calc non Af Amer: 24 mL/min — ABNORMAL LOW (ref >60)
GFR, EST AFRICAN AMERICAN: 28 mL/min — AB (ref >60)
Glucose, Bld: 366 mg/dL — ABNORMAL HIGH (ref 65–99)
Potassium: 4.7 mmol/L (ref 3.5–5.1)
SODIUM: 134 mmol/L — AB (ref 135–145)

## 2016-06-30 MED ORDER — INSULIN ASPART 100 UNIT/ML ~~LOC~~ SOLN
5.0000 [IU] | Freq: Three times a day (TID) | SUBCUTANEOUS | Status: DC
  Administered 2016-06-30 – 2016-07-04 (×11): 5 [IU] via SUBCUTANEOUS

## 2016-06-30 MED ORDER — CIPROFLOXACIN HCL 500 MG PO TABS
500.0000 mg | ORAL_TABLET | Freq: Every day | ORAL | Status: DC
  Administered 2016-06-30 – 2016-07-01 (×2): 500 mg via ORAL
  Filled 2016-06-30 (×2): qty 1

## 2016-06-30 MED ORDER — DIPHENHYDRAMINE HCL 50 MG/ML IJ SOLN: 12.5000 mg | Freq: Three times a day (TID) | INTRAMUSCULAR | Status: DC | PRN

## 2016-06-30 MED ORDER — CARVEDILOL 12.5 MG PO TABS
12.5000 mg | ORAL_TABLET | Freq: Two times a day (BID) | ORAL | Status: DC
  Administered 2016-06-30 – 2016-07-04 (×8): 12.5 mg via ORAL
  Filled 2016-06-30 (×8): qty 1

## 2016-06-30 MED ORDER — CIPROFLOXACIN HCL 500 MG PO TABS: 500.0000 mg | ORAL_TABLET | Freq: Two times a day (BID) | ORAL | Status: DC

## 2016-06-30 MED ORDER — METRONIDAZOLE 500 MG PO TABS
500.0000 mg | ORAL_TABLET | Freq: Three times a day (TID) | ORAL | Status: DC
  Administered 2016-06-30 – 2016-07-01 (×5): 500 mg via ORAL
  Filled 2016-06-30 (×6): qty 1

## 2016-06-30 MED ORDER — INSULIN DEGLUDEC 100 UNIT/ML ~~LOC~~ SOPN
28.0000 [IU] | PEN_INJECTOR | Freq: Every day | SUBCUTANEOUS | Status: DC
Start: 2016-06-30 — End: 2016-07-04
  Administered 2016-06-30 – 2016-07-03 (×3): 28 [IU] via SUBCUTANEOUS

## 2016-06-30 NOTE — Progress Notes (Signed)
[facility] Progress Note  Subjective: The patient had some bloody stools with some dizziness yesterday afternoon around 4 PM. She does not she's had a bowel movement since then.  Objective: Vital signs in last 24 hours: Temp:  [97.5 F (36.4 C)-98.2 F (36.8 C)] 98.2 F (36.8 C) (09/07 0700) Pulse Rate:  [72-90] 80 (09/07 0844) Resp:  [16-19] 16 (09/07 0844) BP: (76-158)/(26-72) 151/64 (09/07 0809) SpO2:  [92 %-100 %] 100 % (09/07 0700) Weight:  [73.6 kg (162 lb 3.2 oz)] 73.6 kg (162 lb 3.2 oz) (09/07 0700) Weight change: 0.907 kg (2 lb)   PE: Unchanged  Lab Results: Results for orders placed or performed during the hospital encounter of 06/28/16 (from the past 24 hour(s))  Glucose, capillary     Status: Abnormal   Collection Time: 06/29/16 10:15 AM  Result Value Ref Range   Glucose-Capillary 210 (H) 65 - 99 mg/dL  Glucose, capillary     Status: Abnormal   Collection Time: 06/29/16 11:50 AM  Result Value Ref Range   Glucose-Capillary 206 (H) 65 - 99 mg/dL  Occult blood card to lab, stool     Status: Abnormal   Collection Time: 06/29/16 12:45 PM  Result Value Ref Range   Fecal Occult Bld POSITIVE (A) NEGATIVE  Prepare RBC     Status: None   Collection Time: 06/29/16 12:52 PM  Result Value Ref Range   Order Confirmation ORDER PROCESSED BY BLOOD BANK   CBC     Status: Abnormal   Collection Time: 06/29/16  1:04 PM  Result Value Ref Range   WBC 6.7 4.0 - 10.5 K/uL   RBC 3.15 (L) 3.87 - 5.11 MIL/uL   Hemoglobin 8.9 (L) 12.0 - 15.0 g/dL   HCT 16.1 (L) 09.6 - 04.5 %   MCV 91.4 78.0 - 100.0 fL   MCH 28.3 26.0 - 34.0 pg   MCHC 30.9 30.0 - 36.0 g/dL   RDW 40.9 (H) 81.1 - 91.4 %   Platelets 121 (L) 150 - 400 K/uL  Basic metabolic panel     Status: Abnormal   Collection Time: 06/29/16  1:04 PM  Result Value Ref Range   Sodium 138 135 - 145 mmol/L   Potassium 5.1 3.5 - 5.1 mmol/L   Chloride 104 101 - 111 mmol/L   CO2 27 22 - 32 mmol/L   Glucose, Bld 266 (H) 65  - 99 mg/dL   BUN 76 (H) 6 - 20 mg/dL   Creatinine, Ser 7.82 (H) 0.44 - 1.00 mg/dL   Calcium 8.2 (L) 8.9 - 10.3 mg/dL   GFR calc non Af Amer 23 (L) >60 mL/min   GFR calc Af Amer 27 (L) >60 mL/min   Anion gap 7 5 - 15  Glucose, capillary     Status: Abnormal   Collection Time: 06/29/16  5:16 PM  Result Value Ref Range   Glucose-Capillary 227 (H) 65 - 99 mg/dL  Urinalysis, Routine w reflex microscopic (not at [facility])     Status: Abnormal   Collection Time: 06/29/16  5:34 PM  Result Value Ref Range   Color, Urine YELLOW YELLOW   APPearance CLOUDY (A) CLEAR   Specific Gravity, Urine 1.015 1.005 - 1.030   pH 5.0 5.0 - 8.0   Glucose, UA NEGATIVE NEGATIVE mg/dL   Hgb urine dipstick MODERATE (A) NEGATIVE   Bilirubin Urine NEGATIVE NEGATIVE   Ketones, ur NEGATIVE NEGATIVE mg/dL   Protein, ur NEGATIVE NEGATIVE mg/dL   Nitrite NEGATIVE NEGATIVE  Leukocytes, UA SMALL (A) NEGATIVE  Urine microscopic-add on     Status: Abnormal   Collection Time: 06/29/16  5:34 PM  Result Value Ref Range   Squamous Epithelial / LPF 6-30 (A) NONE SEEN   WBC, UA 6-30 0 - 5 WBC/hpf   RBC / HPF 0-5 0 - 5 RBC/hpf   Bacteria, UA FEW (A) NONE SEEN   Casts HYALINE CASTS (A) NEGATIVE  CBC     Status: Abnormal   Collection Time: 06/29/16  6:43 PM  Result Value Ref Range   WBC 7.4 4.0 - 10.5 K/uL   RBC 3.39 (L) 3.87 - 5.11 MIL/uL   Hemoglobin 9.8 (L) 12.0 - 15.0 g/dL   HCT 16.1 (L) 09.6 - 04.5 %   MCV 90.0 78.0 - 100.0 fL   MCH 28.9 26.0 - 34.0 pg   MCHC 32.1 30.0 - 36.0 g/dL   RDW 40.9 81.1 - 91.4 %   Platelets 112 (L) 150 - 400 K/uL  Glucose, capillary     Status: Abnormal   Collection Time: 06/29/16  9:18 PM  Result Value Ref Range   Glucose-Capillary 158 (H) 65 - 99 mg/dL  Glucose, capillary     Status: Abnormal   Collection Time: 06/29/16 10:58 PM  Result Value Ref Range   Glucose-Capillary 276 (H) 65 - 99 mg/dL  Basic metabolic panel     Status: Abnormal   Collection Time: 06/30/16  2:35 AM  Result  Value Ref Range   Sodium 134 (L) 135 - 145 mmol/L   Potassium 4.7 3.5 - 5.1 mmol/L   Chloride 98 (L) 101 - 111 mmol/L   CO2 28 22 - 32 mmol/L   Glucose, Bld 366 (H) 65 - 99 mg/dL   BUN 72 (H) 6 - 20 mg/dL   Creatinine, Ser 7.82 (H) 0.44 - 1.00 mg/dL   Calcium 7.9 (L) 8.9 - 10.3 mg/dL   GFR calc non Af Amer 24 (L) >60 mL/min   GFR calc Af Amer 28 (L) >60 mL/min   Anion gap 8 5 - 15  CBC     Status: Abnormal   Collection Time: 06/30/16  2:35 AM  Result Value Ref Range   WBC 7.4 4.0 - 10.5 K/uL   RBC 3.25 (L) 3.87 - 5.11 MIL/uL   Hemoglobin 9.5 (L) 12.0 - 15.0 g/dL   HCT 95.6 (L) 21.3 - 08.6 %   MCV 90.8 78.0 - 100.0 fL   MCH 29.2 26.0 - 34.0 pg   MCHC 32.2 30.0 - 36.0 g/dL   RDW 57.8 46.9 - 62.9 %   Platelets 113 (L) 150 - 400 K/uL  Glucose, capillary     Status: Abnormal   Collection Time: 06/30/16  7:44 AM  Result Value Ref Range   Glucose-Capillary 300 (H) 65 - 99 mg/dL    Studies/Results: Ct Abdomen Pelvis Wo Contrast  Result Date: 06/29/2016 CLINICAL DATA:  Abdominal pain and bloody diarrhea EXAM: CT ABDOMEN AND PELVIS WITHOUT CONTRAST TECHNIQUE: Multidetector CT imaging of the abdomen and pelvis was performed following the standard protocol without IV contrast. COMPARISON:  03/28/2015 FINDINGS: Lower chest: No focal infiltrate is noted. Cardiac shadow is mildly enlarged. Hepatobiliary: Gallbladder is been surgically removed. The liver is within normal limits. Pancreas: Within normal limits. Spleen: Within normal limits. Adrenals/Urinary Tract: The adrenal glands are unremarkable. The kidneys demonstrate no renal calculi or focal mass lesions. Heavy renal arterial calcifications are seen. The bladder is well distended. Stomach/Bowel: The appendix is within normal limits.  Scattered diverticular changes noted. Some very mild pericolonic inflammatory changes seen in the junction of the descending and sigmoid colons. No abscess or perforation is noted. Vascular/Lymphatic: Heavy aortic  and branch vessel calcifications are seen. There are changes of prior iliac stenting. 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 Bilateral bypass grafts in the femoral region are seen. No significant lymphadenopathy is noted. Reproductive: Multiple calcified uterine fibroids are seen. No pelvic mass lesion is noted. Musculoskeletal: Degenerative changes of lumbar spine are seen. Chronic appearing compression deformity at L1 is seen. This is stable from a prior MRI examination. IMPRESSION: Mild diverticulitis at the junction of the descending and sigmoid colons. Chronic changes as described above.  No other acute abnormality Electronically Signed   By: Alcide CleverMark  Lukens M.D.   On: 06/29/2016 17:03      Assessment: Likely lower GI bleed, probably diverticular in and exacerbated by Plavix.  Plan: Continue to monitor stools and hemoglobin. Last colonoscopy 1 year ago, doubt will need to repeat    Navraj Dreibelbis C 06/30/2016, 9:43 AM  Pager [phone] If no answer or after 5 PM call [phone]

## 2016-06-30 NOTE — Progress Notes (Signed)
PROGRESS NOTE  [name] ZOX:[id number] DOB: 1946-10-13 DOA: 06/28/2016 PCP: Cala Bradford, MD  HPI/Recap of past 24 hours:  No more bloody bm , but persistent left lower quadrant pain, no fever, no n/v,  Dizziness upon standing up has resolved, bp better Am blood sugar elevated,   Assessment/Plan: Principal Problem:   GI bleed Active Problems:   History of MI (myocardial infarction)   CAD (coronary artery disease)   Hypertension   Abnormal EKG   Chronic combined systolic and diastolic CHF (congestive heart failure) (HCC)   Chronic respiratory failure with hypoxia (HCC)   Chronic kidney disease, stage 3  GI bleed: with left lower quadrant pain, possible diverticula bleed, plavix held, [facility] following, diet per gi  Acute blood loss anemia: likely from diverticula bleed, prbc transfusion.  Hyperk: unclear etiology, possible from relative insulin deficiency,  on acute ekg changes, continue tele, s/p shift with bicarb/insulin. Repeat k better, continue on demadex, k normalized  Cr elevation: possibly from gi bleed , ua with few bacteria, small leuk, negative nitrite, wbc 6-30, renal dosing meds. CT ab/pel no hydronephrosis, will hold off on renal US for now  Left lower quadrant abdominal pain vs left hip pain: reported s/p fall on left side on 8/20, CT ab/pel + Mild diverticulitis at the junction of the descending and sigmoid colons. No acute hip pathology reported.  Start oral cipro and flagyl  HTN,   d/c imdur and hydralazine d/ced, coreg dose decreased  due to borderline hypotension on 9/6,  bp better on 9/7, increase coreg dose to 12.5 bid,  continue demadex, continue hold hydralazine and imdur, may be able to restart tomorrow  H/o combined chf, currently euvolemic to dry, continue  coreg and demadex  CAD/CABG/h/o MI: - continue ASA, Statin, - Hold Plavix during GI bleed, will follow GI recommendation regarding resuming plavix - EKG I/avl t waves inversion, denies  chest pain, repeat ekg in am  QTC prolongation: repeat ekg in am  Insulin dependent dm2, 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   Code Status: full  Family Communication: patient   Disposition Plan: remain in the hospital   Consultants:  [facility]  Procedures:  none  Antibiotics:  none   Objective: BP (!) 151/64   Pulse 75   Temp 98.2 F (36.8 C)   Resp 19   Ht 5\' 2"  (1.575 m)   Wt 73.6 kg (162 lb 3.2 oz)   SpO2 100%   BMI 29.67 kg/m   Intake/Output Summary (Last 24 hours) at 06/30/16 0838 Last data filed at 06/30/16 0300  Gross per 24 hour  Intake           653.75 ml  Output             1550 ml  Net          -896.25 ml   Filed Weights   06/28/16 1419 06/30/16 0700  Weight: 72.7 kg (160 lb 3.2 oz) 73.6 kg (162 lb 3.2 oz)    Exam:   General:  NAD, + trach  Cardiovascular: RRR  Respiratory: CTABL  Abdomen: Soft/ND/NT, positive BS  Musculoskeletal: left lower quadrant abdominal tenderness vs  left hip tenderness  Neuro: aaox3  Data Reviewed: Basic Metabolic Panel:  Recent Labs Lab 06/28/16 1051 06/29/16 0457 06/29/16 0609 06/29/16 0938 06/29/16 1304 06/30/16 0235  NA 144 136 137  --  138 134*  K 4.8 6.9* 6.2* 5.5* 5.1 4.7  CL 108 99* 99*  --  104 98*  CO2 29 26 27   --  27 28  GLUCOSE 99 128* 126*  --  266* 366*  BUN 56* 73* 74*  --  76* 72*  CREATININE 1.62* 2.05* 2.07*  --  2.06* 2.02*  CALCIUM 8.7* 7.9* 7.9*  --  8.2* 7.9*   Liver Function Tests:  Recent Labs Lab 06/28/16 1051  AST 27  ALT 31  ALKPHOS 73  BILITOT 0.5  PROT 6.1*  ALBUMIN 3.1*    Recent Labs Lab  06/28/16 1051  LIPASE 24   No results for input(s): AMMONIA in the last 168 hours. CBC:  Recent Labs Lab 06/28/16 2010 06/29/16 0457 06/29/16 1304 06/29/16 1843 06/30/16 0235  WBC 7.5 8.2 6.7 7.4 7.4  HGB 10.4* 9.8* 8.9* 9.8* 9.5*  HCT 33.9* 31.8* 28.8* 30.5* 29.5*  MCV 92.1 92.2 91.4 90.0 90.8  PLT 130* 127* 121* 112* 113*   Cardiac Enzymes:   No results for input(s): CKTOTAL, CKMB, CKMBINDEX, TROPONINI in the last 168 hours. BNP (last 3 results)  Recent Labs  11/17/15 0750 04/11/16 1522 05/27/16 1020  BNP 982.1* 623.6* 904.6*    ProBNP (last 3 results) No results for input(s): PROBNP in the last 8760 hours.  CBG:  Recent Labs Lab 06/29/16 1150 06/29/16 1716 06/29/16 2118 06/29/16 2258 06/30/16 0744  GLUCAP 206* 227* 158* 276* 300*    No results found for this or any previous visit (from the past 240 hour(s)).   Studies: Ct Abdomen Pelvis Wo Contrast  Result Date: 06/29/2016 CLINICAL DATA:  Abdominal pain and bloody diarrhea EXAM: CT ABDOMEN AND PELVIS WITHOUT CONTRAST TECHNIQUE: Multidetector CT imaging of the abdomen and pelvis was performed following the standard protocol without IV contrast. COMPARISON:  03/28/2015 FINDINGS: Lower chest: No focal infiltrate is noted. Cardiac shadow is mildly enlarged. Hepatobiliary: Gallbladder is been surgically removed. The liver is within normal limits. Pancreas: Within normal limits. Spleen: Within normal limits. Adrenals/Urinary Tract: The adrenal glands are unremarkable. The kidneys demonstrate no renal calculi or focal mass lesions. Heavy renal arterial calcifications are seen. The bladder is well distended. Stomach/Bowel: The appendix is within normal limits. Scattered diverticular changes noted. Some very mild pericolonic inflammatory changes seen in the junction of the descending and sigmoid colons. No abscess or perforation is noted. Vascular/Lymphatic: Heavy aortic and branch vessel calcifications are seen. There  are changes of prior iliac stenting. 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 Bilateral bypass grafts in the femoral region are seen. No significant lymphadenopathy is noted. Reproductive: Multiple calcified uterine fibroids are seen. No pelvic mass lesion is noted. Musculoskeletal: Degenerative changes of lumbar spine are seen. Chronic appearing compression deformity at L1 is seen. This is stable from a prior MRI examination. IMPRESSION: Mild diverticulitis at the junction of the descending and sigmoid colons. Chronic changes as described above.  No other acute abnormality Electronically Signed   By: Alcide Clever M.D.   On: 06/29/2016 17:03    Scheduled Meds: . sodium chloride   Intravenous Once  . acidophilus   Oral BID  . aspirin EC  81 mg Oral Daily  . carvedilol  6.25 mg Oral  BID WC  . dicyclomine  20 mg Oral TID AC  . DULoxetine  60 mg Oral QHS  . ferrous sulfate  325 mg Oral Q breakfast  . gabapentin  300 mg Oral QHS  . insulin aspart  0-5 Units Subcutaneous QHS  . insulin aspart  0-9 Units Subcutaneous TID WC  . Insulin Degludec  22 Units Subcutaneous QHS  . levothyroxine  75 mcg Oral QAC breakfast  . pantoprazole  40 mg Oral Daily  . simvastatin  40 mg Oral QPM  . torsemide  60 mg Oral Once per day on Sun Tue Thu Sat  . torsemide  80 mg Oral Once per day on Mon Wed Fri    Continuous Infusions:    Time spent: 35mins  Mikenna Bunkley MD, PhD  [facility] Pager [phone]. If 7PM-7AM, please contact night-coverage at [email or web address], password [email or web address] 06/30/2016, 8:38 AM  LOS: 1 day

## 2016-06-30 NOTE — Progress Notes (Signed)
Pt. Was resting well with no distress. Sat 95 %. This pt. Has a chronic trach. Just waiting for medical issues resolved for D/C. Will continue for progress.

## 2016-06-30 NOTE — Progress Notes (Signed)
Inpatient Diabetes Program Recommendations  AACE/ADA: New Consensus Statement on Inpatient Glycemic Control (2015)  Target Ranges:  Prepandial:   less than 140 mg/dL      Peak postprandial:   less than 180 mg/dL (1-2 hours)      Critically ill patients:  140 - 180 mg/dL   Results for [name], [name] (MRN [id number]) as of 06/30/2016 11:46  Ref. Range 06/29/2016 08:13 06/29/2016 10:15 06/29/2016 11:50 06/29/2016 17:16 06/29/2016 21:18 06/29/2016 22:58 06/30/2016 07:44  Glucose-Capillary Latest Ref Range: 65 - 99 mg/dL 409115 (H) 811210 (H) 914206 (H) 227 (H) 158 (H) 276 (H) 300 (H)   Review of Glycemic Control  Diabetes history: DM2 Outpatient Diabetes medications: Tresiba 22 units QHS, Novolog TID with meals (based on correction scale) Current orders for Inpatient glycemic control: Tresiba 22 units QHS, Novolog 0-9 units TID with meals, Novolog 0-5 units QHS  Inpatient Diabetes Program Recommendations: Insulin - Basal: Please consider increasing Tresiba to 28 units QHS. Insulin - Meal Coverage: Please consider ordering Novolog 5 units TID with meals for meal coverage. HgbA1C: Current A1C in process. Outpatient DM medications: Encourage patient to follow up with Dr. Sharl MaKerr (Endocrinologist) regarding insulin dosage changes if needed.  NOTE: Spoke with patient over the phone about diabetes and home regimen for diabetes control. Patient reports that she is followed by Dr. Sharl MaKerr for diabetes management and she last seen Dr. Sharl MaKerr in July 2017. Patient has an appointment with Dr. Sharl MaKerr in October 2017. Currently patient takes [place]-Bissa[place]iba 22 units QHS and Novolog TID with meals (based on correction scale) as an outpatient for diabetes control. Patient reports that she was experiencing some hypoglycemia and when she went to Dr. Sharl MaKerr in July 2017 he decreased her Tresiba from 48 units to 22 units QHS. However, since patient starting taking Tresiba 22 units her glucose has been 200-upper 300's mg/dl. Patient states that she is not  certain what her Novolog correction scale range is but she has been taking around Novolog 12 units with breakfast, Novolog 12-14 units with lunch, and Novolog 12-18 units with supper (all depending what her glucose is but she reports that it has been trending higher since the [place]-Bissauresiba was decreased. Patient states that her daughter just recently called Dr. Daune PerchKerr's office about the hyperglycemia and patient was instructed to increase the Tresiba up to 28 units.  Inquired about prior A1C and patient reports that her last A1C was in the 7% range in July. Informed patient that her current A1C was in process but will likely be higher since her glucose has been running 200-upper 300's mg/dl.  Discussed glucose and A1C goals. Encouraged patient to stay in contact with Dr. Daune PerchKerr's office regarding recommendations for insulin changes based on glucose trends and be sure to follow up with Dr. Sharl MaKerr as scheduled.  Patient verbalized understanding of information discussed and she states that she has no further questions at this time related to diabetes.  Thanks, Orlando PennerMarie Sequoya Hogsett, RN, MSN, CDE Diabetes Coordinator Inpatient Diabetes Program [phone] (Team Pager) [phone] ([facility] office) [phone] [facility] office) [phone] [facility] office)

## 2016-07-01 ENCOUNTER — Encounter (HOSPITAL_COMMUNITY): Payer: Self-pay | Admitting: Nurse Practitioner

## 2016-07-01 DIAGNOSIS — R9431 Abnormal electrocardiogram [ECG] [EKG]: Secondary | ICD-10-CM

## 2016-07-01 DIAGNOSIS — K5732 Diverticulitis of large intestine without perforation or abscess without bleeding: Secondary | ICD-10-CM

## 2016-07-01 DIAGNOSIS — N390 Urinary tract infection, site not specified: Secondary | ICD-10-CM

## 2016-07-01 LAB — BASIC METABOLIC PANEL
ANION GAP: 7 (ref 5–15)
BUN: 59 mg/dL — ABNORMAL HIGH (ref 6–20)
CALCIUM: 8.4 mg/dL — AB (ref 8.9–10.3)
CO2: 31 mmol/L (ref 22–32)
Chloride: 102 mmol/L (ref 101–111)
Creatinine, Ser: 1.67 mg/dL — ABNORMAL HIGH (ref 0.44–1.00)
GFR, EST AFRICAN AMERICAN: 35 mL/min — AB (ref >60)
GFR, EST NON AFRICAN AMERICAN: 30 mL/min — AB (ref >60)
GLUCOSE: 152 mg/dL — AB (ref 65–99)
POTASSIUM: 4.3 mmol/L (ref 3.5–5.1)
Sodium: 140 mmol/L (ref 135–145)

## 2016-07-01 LAB — GLUCOSE, CAPILLARY
GLUCOSE-CAPILLARY: 102 mg/dL — AB (ref 65–99)
GLUCOSE-CAPILLARY: 178 mg/dL — AB (ref 65–99)
Glucose-Capillary: 155 mg/dL — ABNORMAL HIGH (ref 65–99)
Glucose-Capillary: 154 mg/dL — ABNORMAL HIGH (ref 65–99)

## 2016-07-01 LAB — CBC
HEMATOCRIT: 30.7 % — AB (ref 36.0–46.0)
Hemoglobin: 9.7 g/dL — ABNORMAL LOW (ref 12.0–15.0)
MCH: 28.9 pg (ref 26.0–34.0)
MCHC: 31.6 g/dL (ref 30.0–36.0)
MCV: 91.4 fL (ref 78.0–100.0)
Platelets: 128 10*3/uL — ABNORMAL LOW (ref 150–400)
RBC: 3.36 MIL/uL — AB (ref 3.87–5.11)
RDW: 15.3 % (ref 11.5–15.5)
WBC: 6.6 10*3/uL (ref 4.0–10.5)

## 2016-07-01 LAB — HEMOGLOBIN A1C
HEMOGLOBIN A1C: 8.7 % — AB (ref 4.8–5.6)
MEAN PLASMA GLUCOSE: 203 mg/dL

## 2016-07-01 LAB — URINE CULTURE: Culture: >=100000 — AB

## 2016-07-01 LAB — TROPONIN I: TROPONIN I: 0.06 ng/mL — AB (ref <0.03)

## 2016-07-01 MED ORDER — POLYETHYLENE GLYCOL 3350 17 G PO PACK
17.0000 g | PACK | Freq: Every day | ORAL | Status: DC
  Administered 2016-07-01 – 2016-07-04 (×4): 17 g via ORAL
  Filled 2016-07-01 (×4): qty 1

## 2016-07-01 MED ORDER — ISOSORBIDE MONONITRATE ER 30 MG PO TB24
15.0000 mg | ORAL_TABLET | Freq: Every day | ORAL | Status: DC
  Administered 2016-07-01 – 2016-07-04 (×4): 15 mg via ORAL
  Filled 2016-07-01 (×4): qty 1

## 2016-07-01 MED ORDER — OXYCODONE HCL 5 MG PO TABS
5.0000 mg | ORAL_TABLET | Freq: Once | ORAL | Status: AC
  Administered 2016-07-01: 5 mg via ORAL
  Filled 2016-07-01: qty 1

## 2016-07-01 MED ORDER — AMOXICILLIN-POT CLAVULANATE 500-125 MG PO TABS
1.0000 | ORAL_TABLET | Freq: Two times a day (BID) | ORAL | Status: DC
  Administered 2016-07-01 – 2016-07-04 (×7): 500 mg via ORAL
  Filled 2016-07-01 (×7): qty 1

## 2016-07-01 NOTE — Progress Notes (Signed)
[facility] Progress Note  Subjective: No stool last 2 days  Objective: Vital signs in last 24 hours: Temp:  [97.7 F (36.5 C)-98.3 F (36.8 C)] 98.3 F (36.8 C) (09/08 0543) Pulse Rate:  [65-92] 74 (09/08 0842) Resp:  [16-18] 16 (09/08 0751) BP: (91-150)/(50-78) 150/78 (09/08 0842) SpO2:  [92 %-100 %] 96 % (09/08 0751) Weight:  [76.6 kg (168 lb 14.4 oz)] 76.6 kg (168 lb 14.4 oz) (09/08 0543) Weight change: 3.039 kg (6 lb 11.2 oz)   [id number]  Lab Results: Results for orders placed or performed during the hospital encounter of 06/28/16 (from the past 24 hour(s))  Glucose, capillary     Status: Abnormal   Collection Time: 06/30/16 11:58 AM  Result Value Ref Range   Glucose-Capillary 230 (H) 65 - 99 mg/dL  Glucose, capillary     Status: Abnormal   Collection Time: 06/30/16  5:29 PM  Result Value Ref Range   Glucose-Capillary 287 (H) 65 - 99 mg/dL  Glucose, capillary     Status: None   Collection Time: 06/30/16  9:35 PM  Result Value Ref Range   Glucose-Capillary 74 65 - 99 mg/dL  Glucose, capillary     Status: Abnormal   Collection Time: 06/30/16 11:04 PM  Result Value Ref Range   Glucose-Capillary 117 (H) 65 - 99 mg/dL  Troponin I (q 6hr x 3)     Status: Abnormal   Collection Time: 07/01/16  6:22 AM  Result Value Ref Range   Troponin I 0.06 (HH) <0.03 ng/mL  CBC     Status: Abnormal   Collection Time: 07/01/16  6:22 AM  Result Value Ref Range   WBC 6.6 4.0 - 10.5 K/uL   RBC 3.36 (L) 3.87 - 5.11 MIL/uL   Hemoglobin 9.7 (L) 12.0 - 15.0 g/dL   HCT 98.1 (L) 19.1 - 47.8 %   MCV 91.4 78.0 - 100.0 fL   MCH 28.9 26.0 - 34.0 pg   MCHC 31.6 30.0 - 36.0 g/dL   RDW 29.5 62.1 - 30.8 %   Platelets 128 (L) 150 - 400 K/uL  Basic metabolic panel     Status: Abnormal   Collection Time: 07/01/16  6:22 AM  Result Value Ref Range   Sodium 140 135 - 145 mmol/L   Potassium 4.3 3.5 - 5.1 mmol/L   Chloride 102 101 - 111 mmol/L   CO2 31 22 - 32 mmol/L   Glucose, Bld  152 (H) 65 - 99 mg/dL   BUN 59 (H) 6 - 20 mg/dL   Creatinine, Ser 6.57 (H) 0.44 - 1.00 mg/dL   Calcium 8.4 (L) 8.9 - 10.3 mg/dL   GFR calc non Af Amer 30 (L) >60 mL/min   GFR calc Af Amer 35 (L) >60 mL/min   Anion gap 7 5 - 15  Glucose, capillary     Status: Abnormal   Collection Time: 07/01/16  8:01 AM  Result Value Ref Range   Glucose-Capillary 155 (H) 65 - 99 mg/dL    Studies/Results: Ct Abdomen Pelvis Wo Contrast  Result Date: 06/29/2016 CLINICAL DATA:  Abdominal pain and bloody diarrhea EXAM: CT ABDOMEN AND PELVIS WITHOUT CONTRAST TECHNIQUE: Multidetector CT imaging of the abdomen and pelvis was performed following the standard protocol without IV contrast. COMPARISON:  03/28/2015 FINDINGS: Lower chest: No focal infiltrate is noted. Cardiac shadow is mildly enlarged. Hepatobiliary: Gallbladder is been surgically removed. The liver is within normal limits. Pancreas: Within normal limits. Spleen: Within normal limits. Adrenals/Urinary Tract: The adrenal  glands are unremarkable. The kidneys demonstrate no renal calculi or focal mass lesions. Heavy renal arterial calcifications are seen. The bladder is well distended. Stomach/Bowel: The appendix is within normal limits. Scattered diverticular changes noted. Some very mild pericolonic inflammatory changes seen in the junction of the descending and sigmoid colons. No abscess or perforation is noted. Vascular/Lymphatic: Heavy aortic and branch vessel calcifications are seen. There are changes of prior iliac stenting. 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 Bilateral bypass grafts in the femoral region are seen. No significant lymphadenopathy is noted. Reproductive: Multiple calcified uterine fibroids are seen. No pelvic mass lesion is noted. Musculoskeletal: Degenerative changes of lumbar spine are seen. Chronic appearing compression deformity at L1 is seen. This is stable from a prior MRI  examination. IMPRESSION: Mild diverticulitis at the junction of the descending and sigmoid colons. Chronic changes as described above.  No other acute abnormality Electronically Signed   By: Alcide CleverMark  Lukens M.D.   On: 06/29/2016 17:03      Assessment: Diverticular bleed, appears to have resolved  Plan: Expectant management. Hold Plavix for total of 7 days then reevaluate risk/benefit from cardiovascular standpoint relative to GI bleeding. We'll sign off for now.    Tierra Thoma C 07/01/2016, 9:38 AM  Pager [phone] If no answer or after 5 PM call [phone]

## 2016-07-01 NOTE — Care Management Important Message (Signed)
Important Message  Patient Details  Name: [name] MRN: [id number] Date of Birth: 09/29/1946   Medicare Important Message Given:  Yes    Kyla BalzarineShealy, Shaquan Missey Abena 07/01/2016, 11:37 AM

## 2016-07-01 NOTE — Evaluation (Signed)
Physical Therapy Evaluation Patient Details Name: [name] MRN: [id number] DOB: 1946-07-23 Today's Date: 07/01/2016   History of Present Illness  [name] is a 70 y.o. female with medical history significant of MI/CAD s/p CABG, anemia, HLD, COPD, HTN, diabetes, GERD, hypothyroidism, PAD, CVA presenting with multiple bouts of bloody bowel movements.   Clinical Impression  Patient presents with decreased independence with mobility due to pain and poor tolerance to sitting and standing since fall couple of weeks ago.  Feel continued skilled PT in the acute setting and follow up HHPT indicated to address likely SI joint dysfunction.      Follow Up Recommendations Home health PT    Equipment Recommendations  Wheelchair (measurements PT);Wheelchair cushion (measurements PT)    Recommendations for Other Services       Precautions / Restrictions Precautions Precautions: Fall Precaution Comments: chronic trach      Mobility  Bed Mobility Overal bed mobility: Needs Assistance Bed Mobility: Supine to Sit;Sit to Supine     Supine to sit: Supervision;HOB elevated Sit to supine: Min guard   General bed mobility comments: heavy rail use and increased time due to more pain on L hip with coming upright, to supine assist for L leg due to pain  Transfers Overall transfer level: Needs assistance Equipment used: Rolling walker (2 wheeled) Transfers: Sit to/from Stand Sit to Stand: Min guard;Supervision         General transfer comment: for safety  Ambulation/Gait Ambulation/Gait assistance: Supervision Ambulation Distance (Feet): 20 Feet Assistive device: Rolling walker (2 wheeled) Gait Pattern/deviations: Trunk flexed;Decreased stride length;Antalgic     General Gait Details: poor tolerance with increased pain L weight bearing, but able to make it to door and back without LOB or buckling of the knee  Stairs            Wheelchair Mobility    Modified Rankin  (Stroke Patients Only)       Balance Overall balance assessment: Needs assistance   Sitting balance-Leahy Scale: Poor Sitting balance - Comments: leaning over to R due to painful with sitting on L hip     Standing balance-Leahy Scale: Poor Standing balance comment: with UE support S only                             Pertinent Vitals/Pain Pain Assessment: 0-10 Pain Score: 8  Pain Location: L groin Pain Descriptors / Indicators: Aching Pain Intervention(s): Monitored during session    Home Living Family/patient expects to be discharged to:: Private residence Living Arrangements: Children Available Help at Discharge: Family;Available PRN/intermittently Type of Home: House Home Access: Level entry     Home Layout: One level        Prior Function Level of Independence: Independent with assistive device(s)         Comments: used cane PTA, had ESI then next day fell when getting onto [employer] bus, lift bunped and she fell and hurt L hip, reports negative hip xrays     Hand Dominance   Dominant Hand: Right    Extremity/Trunk Assessment   Upper Extremity Assessment: Overall WFL for tasks assessed           Lower Extremity Assessment: LLE deficits/detail   LLE Deficits / Details: AROM painful, but WFL (worse with SLR) strength NT due to pain, reports h/o neuropathy with numbness in toes  Cervical / Trunk Assessment: Other exceptions  Communication   Communication: No difficulties  Cognition Arousal/Alertness: Awake/alert Behavior During Therapy: WFL for tasks assessed/performed Overall Cognitive Status: Within Functional Limits for tasks assessed                      General Comments General comments (skin integrity, edema, etc.): placed pillow between pt's legs and heat pack to L groin, Discussed follow up HHPT to assess and treat likely SI dysfunction    Exercises        Assessment/Plan    PT Assessment Patient needs continued PT  services  PT Diagnosis Difficulty walking;Acute pain   PT Problem List Decreased mobility;Decreased range of motion;Decreased activity tolerance;Decreased balance;Pain  PT Treatment Interventions DME instruction;Gait training;Functional mobility training;Manual techniques;Balance training;Therapeutic exercise;Therapeutic activities;Patient/family education   PT Goals (Current goals can be found in the Care Plan section) Acute Rehab PT Goals Patient Stated Goal: [place] go [place] PT Goal Formulation: With patient Time For Goal Achievement: 07/15/16 Potential to Achieve Goals: Good    Frequency Min 3X/week   Barriers to discharge Decreased caregiver support daughter works till 3 pm    Co-evaluation               End of Session Equipment Utilized During Treatment: Gait belt Activity Tolerance: Patient limited by pain Patient left: in bed;with call bell/phone within reach;with bed alarm set           Time: [id number] PT Time Calculation (min) (ACUTE ONLY): 23 min   Charges:   PT Evaluation $PT Eval Moderate Complexity: 1 Procedure PT Treatments $Therapeutic Activity: 8-22 mins   PT G CodesElray Mcgregor:        Jabril Pursell 07/01/2016, 10:56 AM  Sheran Lawlessyndi Drako Maese, PT [phone] 07/01/2016

## 2016-07-01 NOTE — Progress Notes (Signed)
Notified Lynch, NP that pt received vicodin at 1830. Pt's left leg is still hurting. NP ordered oxy IR 5mg  pox1. Will continue to monitor pt. Nelda MarseilleJenny Thacker, RN

## 2016-07-01 NOTE — Progress Notes (Signed)
PROGRESS NOTE  RICKIA FREEBURG ZOX:[id number] DOB: May 17, 1946 DOA: 06/28/2016 PCP: Cala Bradford, MD  HPI/Recap of past 24 hours:  Feeling better, walked with physical therapy this am, less abdominal pain,  No chest pain, c/o being constipated, no bm yesterday, Am blood sugar better   Assessment/Plan: Principal Problem:   GI bleed Active Problems:   History of MI (myocardial infarction)   CAD (coronary artery disease)   Hypertension   Abnormal EKG   Chronic combined systolic and diastolic CHF (congestive heart failure) (HCC)   Chronic respiratory failure with hypoxia (HCC)   Chronic kidney disease, stage 3  GI bleed: with left lower quadrant pain, possible diverticula bleed, plavix held,per GI plavix need to be held for 7 days. [facility] input appreciated.  Acute blood loss anemia: likely from diverticula bleed, prbc transfusion. hgb has been stable the last three days. No active bleed the last two days.  Hyperk: unclear etiology, possible from relative insulin deficiency,  on acute ekg changes, continue tele, s/p shift with bicarb/insulin. Repeat k better, continue on demadex, k normalized  uti/Cr elevation: possibly from gi bleed and uti Urine culture  + klebsiella resistance to ampicillin and nitrofurontoin, sensitive to the rest of abx tested. CT ab/pel no hydronephrosis. On abx since 9/7, cr improving.  Left lower quadrant abdominal pain vs left hip pain: reported s/p fall on left side on 8/20, CT ab/pel + Mild diverticulitis at the junction of the descending and sigmoid colons. No acute hip pathology reported.  cipro and flagyl, started on 9/7, changed to augmentin on 9/8 due to persistent QTc prolongation, feeling better  HTN,   d/c imdur and hydralazine d/ced, coreg dose decreased  due to borderline hypotension on 9/6,  bp better on 9/7, increase coreg dose to 12.5 bid,  continue demadex,   hydralazine and imdur held since admission due to hypotension, bp better,  restart  imdur on 9/8, consider restart hydralazine on 9/9.  H/o combined chf, currently euvolemic to dry, continue  coreg and demadex, imdur restarted on 9/8.  CAD/CABG/h/o MI: - continue ASA, Statin, - Hold Plavix during GI bleed, will follow GI recommendation regarding resuming plavix - EKG I/avl t waves inversion, denies chest pain, repeat ekg in am seems there is dynamic changes, though no chest pain,  echo cardiogram pending, I discussed the case with cardiology Dr Herbie Baltimore, he recommended outpatient cardiology follow up.  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   Code Status: full  Family Communication: patient   Disposition Plan: anticipate d/c in 24-48hrs   Consultants:  [facility]  Cardiology (phone conversation)  Procedures:  none  Antibiotics:  Cipro/flagyl from 9/7-9/8  augmentin from 9/8   Objective: BP (!) 150/78 (BP Location: Right Arm)   Pulse 74   Temp 98.3 F (36.8 C) (Oral)   Resp 16   Ht 5\' 2"  (1.575 m)   Wt 76.6 kg (168 lb 14.4 oz)   SpO2 96%  BMI 30.89 kg/m   Intake/Output Summary (Last 24 hours) at 07/01/16 0859 Last data filed at 07/01/16 0853  Gross per 24 hour  Intake              700 ml  Output             2150 ml  Net            -1450 ml   Filed Weights   06/28/16 1419 06/30/16 0700 07/01/16 0543  Weight: 72.7 kg (160 lb 3.2 oz) 73.6 kg (162 lb  3.2 oz) 76.6 kg (168 lb 14.4 oz)    Exam:   General:  NAD, + trach  Cardiovascular: RRR  Respiratory: CTABL  Abdomen: Soft/ND/NT, positive BS  Musculoskeletal: left lower quadrant abdominal tenderness vs left hip tenderness has improved  Neuro: aaox3  Data Reviewed: Basic Metabolic Panel:  Recent Labs Lab 06/29/16 0457 06/29/16 0609 06/29/16 0938 06/29/16 1304 06/30/16 0235 07/01/16 0622  NA 136 137  --  138 134* 140  K 6.9* 6.2* 5.5* 5.1 4.7 4.3  CL 99* 99*  --  104 98* 102  CO2 26 27  --  27 28 31   GLUCOSE 128* 126*  --  266* 366* 152*  BUN 73* 74*  --  76* 72* 59*  CREATININE 2.05* 2.07*  --  2.06* 2.02* 1.67*  CALCIUM 7.9* 7.9*  --  8.2* 7.9* 8.4*   Liver Function Tests:  Recent Labs Lab 06/28/16 1051  AST 27  ALT 31  ALKPHOS 73  BILITOT 0.5  PROT 6.1*  ALBUMIN 3.1*    Recent Labs Lab 06/28/16 1051  LIPASE 24   No results for input(s): AMMONIA in the last 168 hours. CBC:  Recent Labs Lab 06/29/16 0457 06/29/16 1304 06/29/16 1843 06/30/16 0235 07/01/16 0622  WBC 8.2 6.7 7.4 7.4 6.6  HGB 9.8* 8.9* 9.8* 9.5* 9.7*  HCT 31.8* 28.8* 30.5* 29.5* 30.7*  MCV 92.2 91.4 90.0 90.8 91.4  PLT 127* 121* 112* 113* 128*   Cardiac Enzymes:    Recent Labs Lab 07/01/16 0622  TROPONINI 0.06*   BNP (last 3 results)  Recent Labs  11/17/15 0750 04/11/16 1522 05/27/16 1020  BNP 982.1* 623.6* 904.6*    ProBNP (last 3 results) No results for input(s): PROBNP in the last 8760 hours.  CBG:  Recent Labs Lab 06/30/16 1158 06/30/16 1729 06/30/16 2135 06/30/16 2304 07/01/16 0801  GLUCAP 230* 287* 74 117* 155*    Recent Results (from the past 240 hour(s))  Urine culture     Status: Abnormal   Collection Time: 06/29/16  5:34 PM  Result Value Ref Range Status   Specimen Description URINE, RANDOM  Final   Special Requests NONE  Final   Culture >=100,000 COLONIES/mL KLEBSIELLA SPECIES (A)  Final   Report Status 07/01/2016 FINAL  Final    Organism ID, Bacteria KLEBSIELLA SPECIES (A)  Final      Susceptibility   Klebsiella species - MIC*    AMPICILLIN >=32 RESISTANT Resistant     CEFAZOLIN <=4 SENSITIVE Sensitive     CEFTRIAXONE <=1 SENSITIVE Sensitive     CIPROFLOXACIN <=0.25 SENSITIVE Sensitive     GENTAMICIN <=1 SENSITIVE Sensitive     IMIPENEM <=0.25 SENSITIVE Sensitive     NITROFURANTOIN 128 RESISTANT Resistant     TRIMETH/SULFA <=20 SENSITIVE Sensitive     AMPICILLIN/SULBACTAM 4 SENSITIVE Sensitive     PIP/TAZO <=4 SENSITIVE Sensitive     Extended ESBL NEGATIVE Sensitive     * >=  100,000 COLONIES/mL KLEBSIELLA SPECIES     Studies: No results found.  Scheduled Meds: . sodium chloride   Intravenous Once  . acidophilus   Oral BID  . aspirin EC  81 mg Oral Daily  . carvedilol  12.5 mg Oral BID WC  . ciprofloxacin  500 mg Oral Daily  . dicyclomine  20 mg Oral TID AC  . DULoxetine  60 mg Oral QHS  . ferrous sulfate  325 mg Oral Q breakfast  . gabapentin  300 mg Oral QHS  . insulin aspart  0-5 Units Subcutaneous QHS  . insulin aspart  0-9 Units Subcutaneous TID WC  . insulin aspart  5 Units Subcutaneous TID WC  . Insulin Degludec  28 Units Subcutaneous QHS  . levothyroxine  75 mcg Oral QAC breakfast  . metroNIDAZOLE  500 mg Oral Q8H  . pantoprazole  40 mg Oral Daily  . simvastatin  40 mg Oral QPM  . torsemide  60 mg Oral Once per day on Sun Tue Thu Sat  . torsemide  80 mg Oral Once per day on Mon Wed Fri    Continuous Infusions:    Time spent: 35mins  Cheryel Kyte MD, PhD  [facility] Pager [phone]. If 7PM-7AM, please contact night-coverage at [email or web address], password [email or web address] 07/01/2016, 8:59 AM  LOS: 2 days

## 2016-07-02 ENCOUNTER — Inpatient Hospital Stay (HOSPITAL_COMMUNITY): Payer: Medicare Other

## 2016-07-02 DIAGNOSIS — K5793 Diverticulitis of intestine, part unspecified, without perforation or abscess with bleeding: Secondary | ICD-10-CM

## 2016-07-02 DIAGNOSIS — R9431 Abnormal electrocardiogram [ECG] [EKG]: Secondary | ICD-10-CM

## 2016-07-02 LAB — BASIC METABOLIC PANEL
Anion gap: 9 (ref 5–15)
BUN: 61 mg/dL — AB (ref 6–20)
CALCIUM: 8.8 mg/dL — AB (ref 8.9–10.3)
CHLORIDE: 98 mmol/L — AB (ref 101–111)
CO2: 33 mmol/L — AB (ref 22–32)
CREATININE: 1.99 mg/dL — AB (ref 0.44–1.00)
GFR calc Af Amer: 28 mL/min — ABNORMAL LOW (ref >60)
GFR calc non Af Amer: 24 mL/min — ABNORMAL LOW (ref >60)
Glucose, Bld: 234 mg/dL — ABNORMAL HIGH (ref 65–99)
Potassium: 5.1 mmol/L (ref 3.5–5.1)
Sodium: 140 mmol/L (ref 135–145)

## 2016-07-02 LAB — ECHOCARDIOGRAM COMPLETE
CHL CUP MV M VEL: 62.7
E/e' ratio: 11.4
EWDT: 232 ms
FS: 14 % — AB (ref 28–44)
HEIGHTINCHES: 62 in
IV/PV OW: 0.97
LA vol A4C: 74.6 ml
LADIAMINDEX: 2.92 cm/m2
LASIZE: 50 mm
LAVOL: 74.3 mL
LAVOLIN: 43.5 mL/m2
LDCA: 3.8 cm2
LEFT ATRIUM END SYS DIAM: 50 mm
LV E/e'average: 11.4
LVEEMED: 11.4
LVELAT: 6.42 cm/s
LVOT diameter: 22 mm
Lateral S' vel: 9.1 cm/s
MV Annulus VTI: 24.8 cm
MV Dec: 232
MV pk A vel: 107 m/s
MV pk E vel: 73.2 m/s
MVAP: 3.24 cm2
MVPG: 2 mmHg
MVSPHT: 68 ms
Mean grad: 2 mmHg
PW: 11.2 mm — AB (ref 0.6–1.1)
RV TAPSE: 13.6 mm
RV sys press: 31 mmHg
Reg peak vel: 264 cm/s
TDI e' lateral: 6.42
TDI e' medial: 5.44
TRMAXVEL: 264 cm/s
WEIGHTICAEL: 2462.1 [oz_av]

## 2016-07-02 LAB — CBC
HCT: 32.2 % — ABNORMAL LOW (ref 36.0–46.0)
HEMOGLOBIN: 9.9 g/dL — AB (ref 12.0–15.0)
MCH: 28.8 pg (ref 26.0–34.0)
MCHC: 30.7 g/dL (ref 30.0–36.0)
MCV: 93.6 fL (ref 78.0–100.0)
PLATELETS: 148 10*3/uL — AB (ref 150–400)
RBC: 3.44 MIL/uL — ABNORMAL LOW (ref 3.87–5.11)
RDW: 15.3 % (ref 11.5–15.5)
WBC: 6.4 10*3/uL (ref 4.0–10.5)

## 2016-07-02 LAB — GLUCOSE, CAPILLARY
GLUCOSE-CAPILLARY: 196 mg/dL — AB (ref 65–99)
GLUCOSE-CAPILLARY: 170 mg/dL — AB (ref 65–99)
Glucose-Capillary: 182 mg/dL — ABNORMAL HIGH (ref 65–99)
Glucose-Capillary: 143 mg/dL — ABNORMAL HIGH (ref 65–99)
Glucose-Capillary: 50 mg/dL — ABNORMAL LOW (ref 65–99)

## 2016-07-02 LAB — TSH: TSH: 4.143 u[IU]/mL (ref 0.350–4.500)

## 2016-07-02 LAB — MAGNESIUM: Magnesium: 2.3 mg/dL (ref 1.7–2.4)

## 2016-07-02 MED ORDER — CLOPIDOGREL BISULFATE 75 MG PO TABS: 75.0000 mg | ORAL_TABLET | Freq: Every day | ORAL | 0 refills | Status: DC

## 2016-07-02 MED ORDER — SENNOSIDES-DOCUSATE SODIUM 8.6-50 MG PO TABS
1.0000 | ORAL_TABLET | Freq: Two times a day (BID) | ORAL | Status: DC
  Administered 2016-07-02 – 2016-07-03 (×3): 1 via ORAL
  Filled 2016-07-02 (×4): qty 1

## 2016-07-02 MED ORDER — DEXTROSE 50 % IV SOLN
INTRAVENOUS | Status: AC
  Administered 2016-07-02: 50 mL
  Filled 2016-07-02: qty 50

## 2016-07-02 NOTE — Progress Notes (Signed)
  Echocardiogram 2D Echocardiogram has been performed.  Delcie RochENNINGTON, Morton Simson 07/02/2016, 12:29 PM

## 2016-07-02 NOTE — Progress Notes (Signed)
PROGRESS NOTE  Becky JunDorothy I [name] DGU:[id number] DOB: 05/30/1946 DOA: 06/28/2016 PCP: [name],CYNTHIA S, MD  HPI/Recap of past 24 hours:   Had 6beats of v tach on tele this am, patient asymptomatic Feeling better,No chest pain, c/o being constipated, no bm x2 days C/o hip pain with activity  Assessment/Plan: Principal Problem:   GI bleed Active Problems:   History of MI (myocardial infarction)   CAD (coronary artery disease)   Hypertension   Abnormal EKG   Chronic combined systolic and diastolic CHF (congestive heart failure) (HCC)   Chronic respiratory failure with hypoxia (HCC)   Chronic kidney disease, stage 3  GI bleed: with left lower quadrant pain, possible diverticula bleed, plavix held,per GI plavix need to be held for 7 days. plavix to be resumed on 9/12, [facility] input appreciated.  Acute blood loss anemia: likely from diverticula bleed, prbc transfusion. hgb has been stable the last three days. No active bleed the last two days.  Hyperk: unclear etiology, possible from relative insulin deficiency,  on acute ekg changes, continue tele, s/p shift with bicarb/insulin. Repeat k better, continue on demadex, k normalized  uti/Cr elevation: possibly from gi bleed and uti Urine culture  + klebsiella resistance to ampicillin and nitrofurontoin, sensitive to the rest of abx tested. CT ab/pel no hydronephrosis. On abx since 9/7, cr improving.  Left lower quadrant abdominal pain vs left hip pain: reported s/p fall on left side on 8/20, CT ab/pel + Mild diverticulitis at the junction of the descending and sigmoid colons. No acute hip pathology reported.  cipro and flagyl, started on 9/7, changed to augmentin on 9/8 due to persistent QTc prolongation, feeling better  HTN,   d/c imdur and hydralazine d/ced, coreg dose decreased  due to borderline hypotension on 9/6,  bp better on 9/7, increase coreg dose to 12.5 bid,  continue demadex,   hydralazine and imdur held since admission due  to hypotension, bp better, restart  imdur on 9/8, continue hold  Hydralazine.  H/o combined chf, currently euvolemic to dry, continue  coreg and demadex, imdur restarted on 9/8.  CAD/CABG/h/o MI: - continue ASA, Statin, - Hold Plavix during GI bleed, will follow GI recommendation regarding resuming plavix - EKG I/avl t waves inversion, denies chest pain, repeat ekg in am seems there is dynamic changes, though no chest pain,  echo cardiogram pending, I discussed the case with cardiology Dr Herbie Baltimoreharding, he recommended outpatient cardiology follow up.  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   Code Status: full  Family Communication: patient in the room, daughter over the phone  Disposition Plan: anticipate d/c 9/9   [facility]  [facility] ([facility])  Procedures:  none  Antibiotics:  Cipro/flagyl from 9/7-9/8  augmentin from 9/8   Objective: BP (!) 115/36   Pulse 72   Temp 98.6 F (37 C) (Oral)   Resp 18   Ht 5\' 2"  (1.575 m)   Wt 69.8 kg (153  lb 14.1 oz)   SpO2 90%   BMI 28.15 kg/m   Intake/Output Summary (Last 24 hours) at 07/02/16 0911 Last data filed at 07/02/16 0550  Gross per 24 hour  Intake              780 ml  Output             1900 ml  Net            -1120 ml   Filed Weights   06/30/16 0700 07/01/16 0543 07/02/16 0550  Weight: 73.6 kg (162 lb 3.2 oz)  76.6 kg (168 lb 14.4 oz) 69.8 kg (153 lb 14.1 oz)    Exam:   General:  NAD, + trach  Cardiovascular: RRR  Respiratory: CTABL  Abdomen: Soft/ND/NT, positive BS  Musculoskeletal: left lower quadrant abdominal tenderness vs left hip tenderness has improved  Neuro: aaox3  Data Reviewed: Basic Metabolic Panel:  Recent Labs Lab 06/29/16 0609 06/29/16 0938 06/29/16 1304 06/30/16 0235 07/01/16 0622 07/02/16 0541  NA 137  --  138 134* 140 140  K 6.2* 5.5* 5.1 4.7 4.3 5.1  CL 99*  --  104 98* 102 98*  CO2 27  --  27 28 31  33*  GLUCOSE 126*  --  266* 366* 152* 234*  BUN 74*  --  76* 72* 59* 61*  CREATININE 2.07*  --  2.06* 2.02* 1.67* 1.99*  CALCIUM 7.9*  --  8.2* 7.9* 8.4* 8.8*  MG  --   --   --   --   --  2.3   Liver Function Tests:  Recent Labs Lab 06/28/16 1051  AST 27  ALT 31  ALKPHOS 73  BILITOT 0.5  PROT 6.1*  ALBUMIN 3.1*    Recent Labs Lab 06/28/16 1051  LIPASE 24   No results for input(s): AMMONIA in the last 168 hours. CBC:  Recent Labs Lab 06/29/16 1304 06/29/16 1843 06/30/16 0235 07/01/16 0622 07/02/16 0541  WBC 6.7 7.4 7.4 6.6 6.4  HGB 8.9* 9.8* 9.5* 9.7* 9.9*  HCT 28.8* 30.5* 29.5* 30.7* 32.2*  MCV 91.4 90.0 90.8 91.4 93.6  PLT 121* 112* 113* 128* 148*   Cardiac Enzymes:    Recent Labs Lab 07/01/16 0622  TROPONINI 0.06*   BNP (last 3 results)  Recent Labs  11/17/15 0750 04/11/16 1522 05/27/16 1020  BNP 982.1* 623.6* 904.6*    ProBNP (last 3 results) No results for input(s): PROBNP in the last 8760 hours.  CBG:  Recent Labs Lab 07/01/16 0801 07/01/16 1152 07/01/16 1630 07/01/16 2135 07/02/16 0857  GLUCAP 155* 154* 102* 178* 196*    Recent Results (from the past 240 hour(s))  Urine culture     Status: Abnormal   Collection Time: 06/29/16  5:34 PM  Result Value Ref Range Status   Specimen Description URINE, RANDOM  Final   Special Requests NONE  Final   Culture >=100,000 COLONIES/mL KLEBSIELLA SPECIES (A)   Final   Report Status 07/01/2016 FINAL  Final   Organism ID, Bacteria KLEBSIELLA SPECIES (A)  Final      Susceptibility   Klebsiella species - MIC*    AMPICILLIN >=32 RESISTANT Resistant     CEFAZOLIN <=4 SENSITIVE Sensitive     CEFTRIAXONE <=1 SENSITIVE Sensitive     CIPROFLOXACIN <=0.25 SENSITIVE Sensitive     GENTAMICIN <=1 SENSITIVE Sensitive     IMIPENEM <=0.25 SENSITIVE Sensitive     NITROFURANTOIN 128 RESISTANT Resistant     TRIMETH/SULFA <=20 SENSITIVE Sensitive  AMPICILLIN/SULBACTAM 4 SENSITIVE Sensitive     PIP/TAZO <=4 SENSITIVE Sensitive     Extended ESBL NEGATIVE Sensitive     * >=100,000 COLONIES/mL KLEBSIELLA SPECIES     Studies: No results found.  Scheduled Meds: . sodium chloride   Intravenous Once  . acidophilus   Oral BID  . amoxicillin-clavulanate  1 tablet Oral Q12H  . aspirin EC  81 mg Oral Daily  . carvedilol  12.5 mg Oral BID WC  . dicyclomine  20 mg Oral TID AC  . DULoxetine  60 mg Oral QHS  . ferrous sulfate  325 mg Oral Q breakfast  . gabapentin  300 mg Oral QHS  . insulin aspart  0-5 Units Subcutaneous QHS  . insulin aspart  0-9 Units Subcutaneous TID WC  . insulin aspart  5 Units Subcutaneous TID WC  . Insulin Degludec  28 Units Subcutaneous QHS  . isosorbide mononitrate  15 mg Oral Daily  . levothyroxine  75 mcg Oral QAC breakfast  . pantoprazole  40 mg Oral Daily  . polyethylene glycol  17 g Oral Daily  . simvastatin  40 mg Oral QPM  . torsemide  60 mg Oral Once per day on Sun Tue Thu Sat  . torsemide  80 mg Oral Once per day on Mon Wed Fri    Continuous Infusions:    Time spent:  Torre Pikus MD, PhD  [facility] Pager [phone]. If 7PM-7AM, please contact night-coverage at [email or web address], password [facility] 07/02/2016, 9:11 AM  LOS: 3 days

## 2016-07-03 ENCOUNTER — Inpatient Hospital Stay (HOSPITAL_COMMUNITY): Payer: Medicare Other

## 2016-07-03 LAB — CBC
HEMATOCRIT: 35.1 % — AB (ref 36.0–46.0)
HEMOGLOBIN: 10.8 g/dL — AB (ref 12.0–15.0)
MCH: 28.9 pg (ref 26.0–34.0)
MCHC: 30.8 g/dL (ref 30.0–36.0)
MCV: 93.9 fL (ref 78.0–100.0)
Platelets: 186 10*3/uL (ref 150–400)
RBC: 3.74 MIL/uL — AB (ref 3.87–5.11)
RDW: 15.4 % (ref 11.5–15.5)
WBC: 7.3 10*3/uL (ref 4.0–10.5)

## 2016-07-03 LAB — URINALYSIS, ROUTINE W REFLEX MICROSCOPIC
Bilirubin Urine: NEGATIVE
GLUCOSE, UA: NEGATIVE mg/dL
Hgb urine dipstick: NEGATIVE
Ketones, ur: NEGATIVE mg/dL
Nitrite: NEGATIVE
PROTEIN: NEGATIVE mg/dL
Specific Gravity, Urine: 1.015 (ref 1.005–1.030)
pH: 5.5 (ref 5.0–8.0)

## 2016-07-03 LAB — GLUCOSE, CAPILLARY
GLUCOSE-CAPILLARY: 169 mg/dL — AB (ref 65–99)
GLUCOSE-CAPILLARY: 318 mg/dL — AB (ref 65–99)
GLUCOSE-CAPILLARY: 244 mg/dL — AB (ref 65–99)
GLUCOSE-CAPILLARY: 288 mg/dL — AB (ref 65–99)

## 2016-07-03 LAB — URINE MICROSCOPIC-ADD ON

## 2016-07-03 LAB — BASIC METABOLIC PANEL
ANION GAP: 9 (ref 5–15)
BUN: 55 mg/dL — AB (ref 6–20)
CO2: 31 mmol/L (ref 22–32)
Calcium: 8.8 mg/dL — ABNORMAL LOW (ref 8.9–10.3)
Chloride: 98 mmol/L — ABNORMAL LOW (ref 101–111)
Creatinine, Ser: 2.11 mg/dL — ABNORMAL HIGH (ref 0.44–1.00)
GFR, EST AFRICAN AMERICAN: 26 mL/min — AB (ref >60)
GFR, EST NON AFRICAN AMERICAN: 23 mL/min — AB (ref >60)
Glucose, Bld: 145 mg/dL — ABNORMAL HIGH (ref 65–99)
POTASSIUM: 4.9 mmol/L (ref 3.5–5.1)
SODIUM: 138 mmol/L (ref 135–145)

## 2016-07-03 LAB — HEPATIC FUNCTION PANEL
ALBUMIN: 3.3 g/dL — AB (ref 3.5–5.0)
ALK PHOS: 77 U/L (ref 38–126)
ALT: 50 U/L (ref 14–54)
AST: 52 U/L — ABNORMAL HIGH (ref 15–41)
Bilirubin, Direct: 0.1 mg/dL (ref 0.1–0.5)
Indirect Bilirubin: 0.5 mg/dL (ref 0.3–0.9)
TOTAL PROTEIN: 6.6 g/dL (ref 6.5–8.1)
Total Bilirubin: 0.6 mg/dL (ref 0.3–1.2)

## 2016-07-03 LAB — LACTIC ACID, PLASMA: Lactic Acid, Venous: 2 mmol/L (ref 0.5–1.9)

## 2016-07-03 MED ORDER — SENNOSIDES-DOCUSATE SODIUM 8.6-50 MG PO TABS
2.0000 | ORAL_TABLET | Freq: Two times a day (BID) | ORAL | Status: DC
Start: 2016-07-03 — End: 2016-07-04
  Administered 2016-07-03 – 2016-07-04 (×2): 2 via ORAL
  Filled 2016-07-03 (×2): qty 2

## 2016-07-03 NOTE — Progress Notes (Signed)
PROGRESS NOTE  [name] ZOX:[id number] DOB: 12-13-1945 DOA: 06/28/2016 PCP: Cala Bradford, MD  HPI/Recap of past 24 hours:   Spike mild temp 100.4, report broke out in  Sweat during mild fever last night Feeling tired today, continue to c/o left hip pain with movement, no pain at rest  c/o no bm Daughter in room  Assessment/Plan: Principal Problem:   GI bleed Active Problems:   History of MI (myocardial infarction)   CAD (coronary artery disease)   Hypertension   Abnormal EKG   Chronic combined systolic and diastolic CHF (congestive heart failure) (HCC)   Chronic respiratory failure with hypoxia (HCC)   Chronic kidney disease, stage 3  GI bleed: with left lower quadrant pain, possible diverticula bleed, plavix held,per GI plavix need to be held for 7 days. plavix to be resumed on 9/12, [facility] input appreciated.  Acute blood loss anemia: likely from diverticula bleed, prbc transfusion total of 2unit initial during hospitalization. hgb has been stable since. No active bleed since 9/6  Hyperk: unclear etiology, possible from relative insulin deficiency,  on acute ekg changes, continue tele, s/p shift with bicarb/insulin. Repeat k better, continue on demadex, k normalized  uti/Cr elevation: possibly from gi bleed and uti Urine culture  + klebsiella resistance to ampicillin and nitrofurontoin, sensitive to the rest of abx tested. CT ab/pel no hydronephrosis. Cr 1.67 on 9/8, cr 2.11 on 9/10 normal range vs worseing renal function, repeat ua, repeat bmp in am.   Left lower quadrant abdominal pain vs left hip pain:  reported s/p fall on left side on 8/20, CT ab/pel + Mild diverticulitis at the junction of the descending and sigmoid colons. No acute hip pathology reported.  cipro and flagyl, started on 9/7, changed to augmentin on 9/8 due to persistent QTc prolongation,  Persistent left hip pain with activity, repeat hip x ray on 9/10  HTN,   d/c imdur and hydralazine  d/ced, coreg dose decreased  due to borderline hypotension on 9/6,  bp better on 9/7, increase coreg dose to 12.5 bid,  continue demadex,   hydralazine and imdur held since admission due to hypotension, bp better, restart  imdur on 9/8, continue hold  Hydralazine.  H/o combined chf, currently euvolemic to dry, continue  coreg and demadex, imdur restarted on 9/8.  CAD/CABG/h/o MI: - continue ASA, Statin, - Hold Plavix during GI bleed, will follow GI recommendation regarding resuming plavix - EKG I/avl t waves inversion, denies chest pain, repeat ekg in am seems there is dynamic changes, though no chest pain,  echo cardiogram pending, I discussed the case with cardiology Dr Herbie Baltimore, he recommended outpatient cardiology follow up. -I have discussed with cardiology Dr Melburn Popper again on 9/10 due to concerning about interval ekg changes, Dr Melburn Popper recommend outpatient cardiology follow up and consider outpatient stress test.  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   Code Status: full  Family Communication: patient in the room, daughter at  bedside  Disposition Plan: anticipate d/c 9/11 if no more fever and cr stable   Consultants:  [facility]  Cardiology (phone conversation with Dr Herbie Baltimore, also discussed case with Dr Melburn Popper in person)  Procedures:  none  Antibiotics:  Cipro/flagyl from 9/7-9/8  augmentin  from 9/8   Objective: BP 116/67   Pulse 85   Temp 99.9 F (37.7 C) (Oral)   Resp 18   Ht 5\' 2"  (1.575 m)   Wt 70.3 kg (155 lb)   SpO2 91%   BMI 28.35 kg/m   Intake/Output Summary (Last 24 hours) at 07/03/16 1132 Last data filed at 07/02/16 1841  Gross per 24 hour  Intake                0 ml  Output              650 ml  Net             -650 ml   Filed Weights   07/01/16 0543 07/02/16 0550 07/03/16 0653  Weight: 76.6 kg (168 lb 14.4 oz) 69.8 kg (153 lb 14.1 oz) 70.3 kg (155 lb)    Exam:   General:  NAD, + trach  Cardiovascular: RRR  Respiratory: CTABL  Abdomen: Soft/ND/NT, positive BS  Musculoskeletal: left lower quadrant abdominal tenderness vs left hip tenderness has improved  Neuro: aaox3  Data Reviewed: Basic Metabolic Panel:  Recent Labs Lab 06/29/16 1304 06/30/16 0235 07/01/16 0622 07/02/16 0541 07/03/16 0711  NA 138 134* 140 140 138  K 5.1 4.7 4.3 5.1 4.9  CL 104 98* 102 98* 98*  CO2 27 28 31  33* 31  GLUCOSE 266* 366* 152* 234* 145*  BUN 76* 72* 59* 61* 55*  CREATININE 2.06* 2.02* 1.67* 1.99* 2.11*  CALCIUM 8.2* 7.9* 8.4* 8.8* 8.8*  MG  --   --   --  2.3  --    Liver Function Tests:  Recent Labs Lab 06/28/16 1051 07/03/16 0711  AST 27 52*  ALT 31 50  ALKPHOS 73 77  BILITOT 0.5 0.6  PROT 6.1* 6.6  ALBUMIN 3.1* 3.3*    Recent Labs Lab 06/28/16 1051  LIPASE 24   No results for input(s): AMMONIA in the last 168 hours. CBC:  Recent Labs Lab 06/29/16 1843 06/30/16 0235 07/01/16 0622 07/02/16 0541 07/03/16 0711  WBC 7.4 7.4 6.6 6.4 7.3  HGB 9.8* 9.5* 9.7* 9.9* 10.8*  HCT 30.5* 29.5* 30.7* 32.2* 35.1*  MCV 90.0 90.8 91.4 93.6 93.9  PLT 112* 113* 128* 148* 186   Cardiac Enzymes:    Recent Labs Lab 07/01/16 0622  TROPONINI 0.06*   BNP (last 3 results)  Recent Labs  11/17/15 0750 04/11/16 1522 05/27/16 1020  BNP 982.1* 623.6* 904.6*    ProBNP (last 3 results) No results for input(s): PROBNP in the last 8760  hours.  CBG:  Recent Labs Lab 07/02/16 1234 07/02/16 1650 07/02/16 2128 07/02/16 2220 07/03/16 0811  GLUCAP 182* 143* 50* 170* 169*    Recent Results (from the past 240 hour(s))  Urine culture     Status: Abnormal   Collection Time: 06/29/16  5:34 PM  Result Value Ref Range Status   Specimen Description URINE, RANDOM  Final   Special Requests NONE  Final   Culture >=100,000 COLONIES/mL KLEBSIELLA SPECIES (A)  Final   Report Status 07/01/2016 FINAL  Final   Organism ID, Bacteria KLEBSIELLA SPECIES (A)  Final      Susceptibility   Klebsiella species -  MIC*    AMPICILLIN >=32 RESISTANT Resistant     CEFAZOLIN <=4 SENSITIVE Sensitive     CEFTRIAXONE <=1 SENSITIVE Sensitive     CIPROFLOXACIN <=0.25 SENSITIVE Sensitive     GENTAMICIN <=1 SENSITIVE Sensitive     IMIPENEM <=0.25 SENSITIVE Sensitive     NITROFURANTOIN 128 RESISTANT Resistant     TRIMETH/SULFA <=20 SENSITIVE Sensitive     AMPICILLIN/SULBACTAM 4 SENSITIVE Sensitive     PIP/TAZO <=4 SENSITIVE Sensitive     Extended ESBL NEGATIVE Sensitive     * >=100,000 COLONIES/mL KLEBSIELLA SPECIES     Studies: Dg Chest 2 View  Result Date: 07/03/2016 CLINICAL DATA:  70 year old female with a history of fever EXAM: CHEST  2 VIEW COMPARISON:  06/12/2016, 11/15/2015, CT 12/11/2015 FINDINGS: Cardiomediastinal silhouette unchanged with cardiomegaly. Surgical changes of median sternotomy and CABG. Calcifications of the aortic arch. Tracheostomy. Low lung volumes with interstitial opacities and interlobular septal thickening. Compared to the prior plain film there is improved aeration at the right base. No pleural effusion or confluent airspace disease. Lateral view demonstrates similar configuration the vertebral bodies, with upper lumbar compression fracture unchanged. IMPRESSION: Coarsened interstitial markings may reflect chronic scarring/ atelectasis, though early edema or atypical infection not excluded. Surgical changes of median  sternotomy and CABG. Aortic atherosclerosis. Cardiomegaly. Signed, Yvone NeuJaime S. Loreta AveWagner, DO [facility] Electronically Signed   By: Gilmer MorJaime  Wagner D.O.   On: 07/03/2016 10:17   Dg Hip Unilat With Pelvis 2-3 Views Left  Result Date: 07/03/2016 CLINICAL DATA:  Left hip pain after fall 2 weeks ago. EXAM: DG HIP (WITH OR WITHOUT PELVIS) 2-3V LEFT COMPARISON:  None. FINDINGS: There is no evidence of hip fracture or dislocation. There is no evidence of arthropathy or other focal bone abnormality. IMPRESSION: Normal left hip. Electronically Signed   By: Lupita RaiderJames  Green Jr, M.D.   On: 07/03/2016 10:17    Scheduled Meds: . sodium chloride   Intravenous Once  . acidophilus   Oral BID  . amoxicillin-clavulanate  1 tablet Oral Q12H  . aspirin EC  81 mg Oral Daily  . carvedilol  12.5 mg Oral BID WC  . dicyclomine  20 mg Oral TID AC  . DULoxetine  60 mg Oral QHS  . ferrous sulfate  325 mg Oral Q breakfast  . gabapentin  300 mg Oral QHS  . insulin aspart  0-5 Units Subcutaneous QHS  . insulin aspart  0-9 Units Subcutaneous TID WC  . insulin aspart  5 Units Subcutaneous TID WC  . Insulin Degludec  28 Units Subcutaneous QHS  . isosorbide mononitrate  15 mg Oral Daily  . levothyroxine  75 mcg Oral QAC breakfast  . pantoprazole  40 mg Oral Daily  . polyethylene glycol  17 g Oral Daily  . senna-docusate  1 tablet Oral BID  . simvastatin  40 mg Oral QPM  . torsemide  60 mg Oral Once per day on Sun Tue Thu Sat  . torsemide  80 mg Oral Once per day on Mon Wed Fri    Continuous Infusions:    Time spent: 35mins  Yacoub Diltz MD, PhD  [facility] Pager [phone]. If 7PM-7AM, please contact night-coverage at [email or web address], password [email or web address] 07/03/2016, 11:32 AM  LOS: 4 days

## 2016-07-04 DIAGNOSIS — Z951 Presence of aortocoronary bypass graft: Secondary | ICD-10-CM

## 2016-07-04 LAB — COMPREHENSIVE METABOLIC PANEL
ALK PHOS: 94 U/L (ref 38–126)
ALT: 46 U/L (ref 14–54)
ANION GAP: 10 (ref 5–15)
AST: 33 U/L (ref 15–41)
Albumin: 3.2 g/dL — ABNORMAL LOW (ref 3.5–5.0)
BILIRUBIN TOTAL: 0.3 mg/dL (ref 0.3–1.2)
BUN: 64 mg/dL — ABNORMAL HIGH (ref 6–20)
CALCIUM: 8.5 mg/dL — AB (ref 8.9–10.3)
CO2: 31 mmol/L (ref 22–32)
CREATININE: 2.13 mg/dL — AB (ref 0.44–1.00)
Chloride: 96 mmol/L — ABNORMAL LOW (ref 101–111)
GFR calc non Af Amer: 22 mL/min — ABNORMAL LOW (ref >60)
GFR, EST AFRICAN AMERICAN: 26 mL/min — AB (ref >60)
GLUCOSE: 214 mg/dL — AB (ref 65–99)
Potassium: 4.9 mmol/L (ref 3.5–5.1)
Sodium: 137 mmol/L (ref 135–145)
TOTAL PROTEIN: 6.4 g/dL — AB (ref 6.5–8.1)

## 2016-07-04 LAB — CBC
HCT: 32.1 % — ABNORMAL LOW (ref 36.0–46.0)
HEMOGLOBIN: 10.1 g/dL — AB (ref 12.0–15.0)
MCH: 29.5 pg (ref 26.0–34.0)
MCHC: 31.5 g/dL (ref 30.0–36.0)
MCV: 93.9 fL (ref 78.0–100.0)
PLATELETS: 204 10*3/uL (ref 150–400)
RBC: 3.42 MIL/uL — AB (ref 3.87–5.11)
RDW: 15.1 % (ref 11.5–15.5)
WBC: 6.5 10*3/uL (ref 4.0–10.5)

## 2016-07-04 LAB — LACTIC ACID, PLASMA: Lactic Acid, Venous: 0.9 mmol/L (ref 0.5–1.9)

## 2016-07-04 LAB — GLUCOSE, CAPILLARY
GLUCOSE-CAPILLARY: 170 mg/dL — AB (ref 65–99)
Glucose-Capillary: 243 mg/dL — ABNORMAL HIGH (ref 65–99)

## 2016-07-04 MED ORDER — POLYETHYLENE GLYCOL 3350 17 G PO PACK: 17.0000 g | PACK | Freq: Every day | ORAL | 0 refills | Status: AC

## 2016-07-04 MED ORDER — SENNOSIDES-DOCUSATE SODIUM 8.6-50 MG PO TABS: 2.0000 | ORAL_TABLET | Freq: Every day | ORAL | 0 refills | Status: AC

## 2016-07-04 MED ORDER — BISACODYL 10 MG RE SUPP
10.0000 mg | Freq: Every day | RECTAL | Status: DC
  Filled 2016-07-04: qty 1

## 2016-07-04 MED ORDER — AMOXICILLIN-POT CLAVULANATE 875-125 MG PO TABS: 1.0000 | ORAL_TABLET | Freq: Two times a day (BID) | ORAL | 0 refills | Status: DC

## 2016-07-04 NOTE — Care Management Note (Signed)
Case Management Note  Patient Details  Name: [name] MRN: [id number] Date of Birth: 02/15/1946  Subjective/Objective:         Spoke with patient, [facility]. [facility] to resume services after DC.  Patient active with [facility]   Action/Plan:  [facility] Expected Discharge Date:                  Expected Discharge Plan:  Home w Home Health Services  In-House Referral:  NA  Discharge planning Services  CM Consult  Post Acute Care Choice:  Resumption of Svcs/PTA Provider Choice offered to:  Patient  DME Arranged:  Oxygen (uses 4l/min, oxygen) DME Agency:  [facility]  HH Arranged:  [facility], PT, OT [facility]  [facility]  Status of Service:  Completed, signed off  If discussed at Long Length of Stay Meetings, dates discussed:    Additional Comments:  Lawerance SabalDebbie Mikea Quadros, RN 07/04/2016, 11:46 AM

## 2016-07-04 NOTE — Care Management Important Message (Signed)
Important Message  Patient Details  Name: [name] MRN: [id number] Date of Birth: 03/24/1946   Medicare Important Message Given:  Yes    [name] 07/04/2016, 2:39 PM

## 2016-07-04 NOTE — Consult Note (Signed)
   [facility]   07/04/2016  [name] 06/25/1946 [id number]   [facility]. Chart reviewed. Noted [name] is active with [facility] palliative home based program. Telephone call to [facility] at [phone]. Spoke with Rosann Auerbachrish to confirm that [facility] will continue to follow patient post hospital discharge. Spoke with inpatient RNCM to make aware of the above. [facility] not appropriate at this time.   Raiford NobleAtika Norlan Rann, MSN-Ed, RN,BSN [facility] Hospital Liaison [phone]

## 2016-07-04 NOTE — Discharge Summary (Signed)
Discharge Summary  [name] ZOX:[id number] DOB: 18-Jun-1946  PCP: Cala Bradford, MD  Admit date: 06/28/2016 Discharge date: 07/04/2016  Time spent: >4mins  Recommendations for Outpatient Follow-up:  1. F/u with PMD within a week  for hospital discharge follow up, repeat cbc/bmp at follow up 2. F/u with cardiology Dr Chales Abrahams to discuss outpatient stress test 3. F/u with orthopedics Dr Ala Dach for left hip pain 4. F/u with GI Dr Madilyn Fireman for diverticula and gi bleed  Discharge Diagnoses:  Active Hospital Problems   Diagnosis Date Noted  . GI bleed 06/28/2016  . Chronic kidney disease, stage 3 11/10/2015  . Chronic respiratory failure with hypoxia (HCC) 11/04/2015  . Abnormal EKG 07/27/2015  . Chronic combined systolic and diastolic CHF (congestive heart failure) (HCC) 07/27/2015  . Hypertension 11/26/2011  . CAD (coronary artery disease)   . History of MI (myocardial infarction)     Resolved Hospital Problems   Diagnosis Date Noted Date Resolved  No resolved problems to display.    Discharge Condition: stable  Diet recommendation: heart healthy/carb modified  Filed Weights   07/02/16 0550 07/03/16 0653 07/04/16 8119  Weight: 69.8 kg (153 lb 14.1 oz) 70.3 kg (155 lb) 71.2 kg (156 lb 14.4 oz)    History of present illness:  Chief Complaint: bloody stool  HPI: [name] is a 70 y.o. female with medical history significant of MI/CAD s/p CABG, anemia, HLD, COPD, HTN, diabetes, GERD, hypothyroidism, PAD, CVA presenting with multiple bouts of bloody bowel movements. Patient states that she woke this morning to find blood in the bed. Shortly thereafter she developed some mild abdominal discomfort and urge to have a bowel movement. Patient had 3 dark bloody bowel movements at home with clots in the toilet. Very little stool type matter. Denies any NSAID use the patient is on Plavix. Colonoscopy 1 year ago with diverticular disease noted. Last GI bleed approximately one  year ago. Patient denies any chest pain, palpitations, shortness of breath, nausea, vomiting, hematemesis, fevers, dysuria, frequency. Patient also endorses approximately two-week history of left flank pain. Started approximately 1-2 days after falling. This pain is intermittent area mild. Worse with certain movements. Improves with rest. Relieved with Tylenol.  ED Course: Objective findings outlined below. [facility] called to evaluate patient. Consult pending. IV fluids started.   Hospital Course:  Principal Problem:   GI bleed Active Problems:   History of MI (myocardial infarction)   CAD (coronary artery disease)   Hypertension   Abnormal EKG   Chronic combined systolic and diastolic CHF (congestive heart failure) (HCC)   Chronic respiratory failure with hypoxia (HCC)   Chronic kidney disease, stage 3   GI bleed: with left lower quadrant pain, possible diverticula bleed, plavix held,per GI plavix need to be held for 7 days. plavix to be resumed on 9/12, [facility] input appreciated.  Acute blood loss anemia: likely from diverticula bleed, prbc transfusion total of 2unit initial during hospitalization. hgb has been stable since. No active bleed since 9/6  Hyperk: unclear etiology, possible from relative insulin deficiency,  on acute ekg changes, continue tele, s/p shift with bicarb/insulin. Repeat k better, continue on demadex, k normalized  uti/Cr elevation: possibly from gi bleed and uti Urine culture  + klebsiella resistance to ampicillin and nitrofurontoin, sensitive to the rest of abx tested. CT ab/pel no hydronephrosis. Cr 1.67 on 9/8, cr 2.11 on 9/10 normal range vs worseing renal function, repeat ua indicate uti being treated, pmd to continue monitor cr.  she is discharged on augmentin  Left lower quadrant abdominal pain vs left hip pain:  reported s/p fall on left side on 8/20, CT ab/pel + Mild diverticulitis at the junction of the descending and sigmoid colons. No acute  hip pathology reported.  cipro and flagyl, started on 9/7, changed to augmentin on 9/8 due to persistent QTc prolongation,  Persistent left hip pain with activity, no acute changes on repeat hip x ray on 9/10 She is to follow up with orthopedics for left hip pain  HTN,   on 9/6  imdur and hydralazine d/ced, coreg dose decreased  due to borderline hypotension.  bp better on 9/7, increase coreg dose to 12.5 bid,  continue demadex,   hydralazine and imdur held since admission due to hypotension, bp better,  restarted  imdur on 9/8,  bp continue to improve, she is restarted on all home bp meds at discharge, patient is advised to check blood pressure at home and bring in blood pressure record to pmd.   H/o combined chf, currently euvolemic to dry, all home meds restarted at discharge.  CAD/CABG/h/o MI: - continue ASA, Statin, - Plavix held due to GI bleed,  GI recommendationed holding plavix for total of 7 days,  - EKG I/avl t waves inversion, denies chest pain, repeat ekg in am seems there is dynamic changes, though no chest pain,  echo cardiogram pending, I discussed the case with cardiology Dr Herbie Baltimore, he recommended outpatient cardiology follow up. -I have discussed with cardiology Dr Melburn Popper again on 9/10 due to concerning about interval ekg changes, Dr Melburn Popper recommend outpatient cardiology follow up and consider outpatient stress test. -plavix to be restarted on 9/12.  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   Code Status: full  Family Communication: patient in the room, daughter at bedside  Disposition Plan: d/c home with home health on 9/11    Consultants:  [facility]  Card[facility] (phone conversation with Dr Herbie Baltimore, also discussed case with Dr Melburn Popper in person)  Procedures:  none  Antibiotics:  Cipro/flagyl from 9/7-9/8  augmentin from 9/8   Discharge Exam: BP (!) 127/47 (BP Location: Left Arm)   Pulse 77   Temp 97.5 F (36.4 C) (Oral)   Resp 16   Ht 5\' 2"  (1.575 m)   Wt 71.2 kg (156 lb 14.4 oz)   SpO2 100%   BMI 28.70 kg/m     General:  NAD, + trach  Cardiovascular: RRR  Respiratory: CTABL  Abdomen: Soft/ND/NT, positive BS  Musculoskeletal: left lower quadrant abdominal tenderness vs left hip tenderness has improved  Neuro: aaox3  Discharge Instructions You were cared for by a hospitalist during your hospital stay. If you have any questions about your discharge medications or the care you received while you were in the hospital after you are discharged, you can call the unit and asked to speak with the hospitalist on call if the hospitalist  that took care of you is not available. Once you are discharged, your primary care physician will handle any further medical issues. Please note that NO REFILLS for any discharge medications will be authorized once you are discharged, as it is imperative that you return to your primary care physician (or establish a relationship with a primary care physician if you do not have one) for your aftercare needs so that they can reassess your need for medications and monitor your lab values.  Discharge Instructions    Diet - low sodium heart healthy    Complete by:  As directed   Carb modified diet   Face-to-face encounter (required for Medicare/Medicaid patients)    Complete by:   As directed   I Braylynn Lewing certify that this patient is under my care and that I, or a nurse practitioner or physician's assistant working with me, had a face-to-face encounter that meets the physician face-to-face encounter requirements with this patient on 07/01/2016. The encounter with the patient was in whole, or in part for the following medical condition(s) which is the primary reason for home health care (List medical condition): FTT, falls, chronic trach   The encounter with the patient was in whole, or in part, for the following medical condition, which is the primary reason for home health care:  FTT   I certify that, based on my findings, the following services are medically necessary home health services:  Physical therapy   Reason for Medically Necessary Home Health Services:  Therapy- Investment banker, operationalGait Training, Patent examinerTransfer Training and Stair Training   My clinical findings support the need for the above services:  Shortness of breath with activity   Further, I certify that my clinical findings support that this patient is homebound due to:  Shortness of Breath with activity   Home Health    Complete by:  As directed   To provide the following care/treatments:   PT OT Respiratory Care     Increase activity slowly    Complete by:  As directed       Medication List    TAKE these medications   acetaminophen 500 MG tablet Commonly known as:  TYLENOL Take 1,000 mg by mouth daily as needed for mild pain or headache.   amoxicillin-clavulanate 875-125 MG tablet Commonly known as:  AUGMENTIN Take 1 tablet by mouth 2 (two) times daily.   aspirin EC 81 MG tablet Take 81 mg by mouth daily.   carvedilol 12.5 MG tablet Commonly known as:  COREG Take 1 tablet (12.5 mg total) by mouth 2 (two) times daily with a meal.   clopidogrel 75 MG tablet Commonly known as:  PLAVIX Take 1 tablet (75 mg total) by mouth daily. Hold for two more days, restart on 9/12 What changed:  additional instructions   [employer]  GLUCOSE 4 GM chewable tablet Generic drug:  glucose Chew 1 tablet by mouth as needed for low blood sugar.   dicyclomine 20 MG tablet Commonly known as:  BENTYL Take 20 mg by mouth 3 (three) times daily before meals.   DULoxetine 60 MG capsule Commonly known as:  CYMBALTA Take 60 mg by mouth at bedtime.   ferrous sulfate 325 (65 FE) MG tablet Take 325 mg by mouth daily with breakfast.   gabapentin 300 MG capsule Commonly known as:  NEURONTIN Take 1 capsule (300 mg total) by mouth at bedtime.   hydrALAZINE 25 MG tablet Commonly known as:  APRESOLINE Take 1 tablet (25 mg total) by mouth 3 (three)  times daily.   HYDROcodone-acetaminophen 10-325 MG tablet Commonly known as:  NORCO Take 0.5-1 tablets by mouth every 6 (six) hours as needed for moderate pain.   ipratropium-albuterol 0.5-2.5 (3) MG/3ML Soln Commonly known as:  DUONEB Take 3 mLs by nebulization 2 (two) times daily as needed (for shortnes of breath).   isosorbide mononitrate 30 MG 24 hr tablet Commonly known as:  IMDUR Take 15 mg by mouth daily.   lansoprazole 30 MG capsule Commonly known as:  PREVACID Take 30 mg by mouth 2 (two) times daily.   levothyroxine 75 MCG tablet Commonly known as:  SYNTHROID, LEVOTHROID Take 75 mcg by mouth daily before breakfast.   nitroGLYCERIN 0.4 MG SL tablet Commonly known as:  NITROSTAT Place 0.4 mg under the tongue every 5 (five) minutes as needed for chest pain (x 3 doses). For chest pain   NOVOLOG FLEXPEN 100 UNIT/ML FlexPen Generic drug:  insulin aspart 70 -199 TAKE 12 UNITS, 200-299 TAKE 14 UNITS, 300 OR HIGHER 16 UNITS SUBCUTANEOUS (12-16 units subcutaneously three (3) times a day)   OXYGEN Inhale 4 L into the lungs at bedtime.   polyethylene glycol packet Commonly known as:  MIRALAX / GLYCOLAX Take 17 g by mouth daily.   PROBIOTIC PO Take 1 tablet by mouth daily.   senna-docusate 8.6-50 MG tablet Commonly known as:  Senokot-S Take 2 tablets by mouth at  bedtime.   simvastatin 40 MG tablet Commonly known as:  ZOCOR Take 40 mg by mouth every evening.   tiZANidine 2 MG tablet Commonly known as:  ZANAFLEX Take 2 mg by mouth daily.   torsemide 20 MG tablet Commonly known as:  DEMADEX Take 4 tablets (80 mg total) by mouth as directed. Every mon, wed and fri take 4 tabs = 80 mg daily; all other days 3 tabs = 60 mg daily What changed:  when to take this  additional instructions   TRESIBA FLEXTOUCH 100 UNIT/ML Sopn Generic drug:  Insulin Degludec Inject 22 Units into the skin at bedtime.   WOMENS 50+ MULTI VITAMIN/MIN Tabs Take 1 tablet by mouth daily.      Allergies  Allergen Reactions  . Aldactone [Spironolactone] Other (See Comments)    Severe hyperkalemia   . Lisinopril Other (See Comments) and Cough    Hypotension also  . Crestor [Rosuvastatin Calcium] Other (See Comments)    Muscle Pain  . Vicodin [Hydrocodone-Acetaminophen] Nausea And Vomiting   Follow-up Information    Donato Schultz, MD Follow up in 2 week(s).   Specialty:  Cardiology Why:  ekg changes, to discuss outpatient stress test. Contact information: [address] [place] [address] [phone]        Cala Bradford, MD Follow up in 1 week(s).   Specialty:  Family Medicine Why:  hospital discharge follow up, repeat cbc/bmp at follow up, continue work with your primary care doctor for blood sugar control, please check blood pressure at home and bring in blood pressure record to pmd.   Contact information: [address] [place] [address] [phone]        Regino Bellow, MD Follow up in 2 week(s).   Specialty:  Sports Medicine Why:  left hip pain Contact information: [address] [place] [address] [phone]        HAYES,JOHN C, MD Follow up in 1 month(s).   Specialty:  Gastroenterology Why:  for diverticula , gi bleed Contact information: [address] [place] [address] [phone]            The results of significant diagnostics from this hospitalization (including imaging, microbiology, ancillary and laboratory) are listed below for reference.    Significant Diagnostic Studies: Ct Abdomen Pelvis Wo Contrast  Result Date: 06/29/2016 CLINICAL DATA:  Abdominal pain and bloody diarrhea EXAM: CT ABDOMEN AND PELVIS WITHOUT CONTRAST TECHNIQUE: Multidetector CT imaging of the abdomen and pelvis was performed following the standard protocol without IV contrast. COMPARISON:  03/28/2015 FINDINGS: Lower chest: No focal infiltrate is noted. Cardiac shadow is mildly enlarged. Hepatobiliary: Gallbladder is been surgically removed. The liver is within normal limits. Pancreas: Within normal limits. Spleen: Within normal limits. Adrenals/Urinary Tract: The adrenal glands are unremarkable. The kidneys demonstrate no renal calculi or focal mass lesions. Heavy renal arterial calcifications are seen. The bladder is well distended. Stomach/Bowel: The appendix is within normal limits. Scattered diverticular changes noted. Some very mild pericolonic inflammatory changes seen in the junction of the descending and sigmoid colons. No abscess or perforation is noted. Vascular/Lymphatic: Heavy aortic and branch vessel calcifications are seen. There are changes of prior iliac stenting. 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 Bilateral bypass grafts in the femoral region are seen. No significant lymphadenopathy is noted. Reproductive: Multiple calcified uterine fibroids are seen. No pelvic mass lesion is noted. Musculoskeletal: Degenerative changes of lumbar spine are seen. Chronic appearing compression deformity at L1 is seen. This is stable from a prior MRI examination. IMPRESSION: Mild diverticulitis at the junction of the descending and sigmoid colons. Chronic changes as described above.  No other acute  abnormality Electronically Signed   By: Alcide Clever M.D.   On: 06/29/2016 17:03   Dg Chest 2 View  Result Date: 07/03/2016 CLINICAL DATA:  70 year old female with a history of fever EXAM: CHEST  2 VIEW COMPARISON:  06/12/2016, 11/15/2015, CT 12/11/2015 FINDINGS: Cardiomediastinal silhouette unchanged with cardiomegaly. Surgical changes of median sternotomy and CABG. Calcifications of the aortic arch. Tracheostomy. Low lung volumes with interstitial opacities and interlobular septal thickening. Compared to the prior plain film there is improved aeration at the right base. No pleural effusion or confluent airspace disease. Lateral view demonstrates similar configuration the vertebral bodies, with upper lumbar compression fracture unchanged. IMPRESSION: Coarsened interstitial markings may reflect chronic scarring/ atelectasis, though early edema or atypical infection not excluded. Surgical changes of median sternotomy and CABG. Aortic atherosclerosis. Cardiomegaly. Signed, Yvone Neu. Loreta Ave, DO [facility] Electronically Signed   By: Gilmer Mor D.O.   On: 07/03/2016 10:17   Dg Chest 2 View  Result Date: 06/12/2016 CLINICAL DATA:  Larey Seat on abuts yesterday. Pulling sensation left side of chest. EXAM: CHEST  2 VIEW COMPARISON:  11/15/2015.  CT 12/11/2015 FINDINGS: Tracheostomy in place. Previous median sternotomy. Cardiomegaly. Aortic atherosclerosis. Chronic abnormal interstitial lung markings. Chronic focal density in both lower lobes. Findings are unchanged in the previous studies. No evidence of fracture. IMPRESSION: No acute or traumatic finding. Tracheostomy. Cardiomegaly. Aortic atherosclerosis. Chronic interstitial lung markings. Chronic lower lung density bilaterally left more than right, unchanged from previous studies and determined to be benign. Electronically Signed   By: Paulina Fusi M.D.   On: 06/12/2016 17:51   Dg Knee Complete 4 Views  Left  Result Date: 06/12/2016 CLINICAL DATA:  Larey Seat while on a abuts yesterday. Anterior knee pain. EXAM: LEFT KNEE - COMPLETE 4+ VIEW COMPARISON:  None. FINDINGS: No evidence of fracture.  There is osteoarthritis more advanced in the medial compartment pain in the lateral compartment and patellofemoral joint. There is a small knee joint effusion. Extensive vascular calcification is noted. IMPRESSION: No acute radiographic finding. 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 Small joint effusion. Electronically Signed   By: Paulina Fusi M.D.   On: 06/12/2016 17:49   Dg Inject Diag/thera/inc Needle/cath/plc Epi/lumb/sac W/img  Result Date: 06/06/2016 CLINICAL DATA:  Spondylosis without myelopathy. Multilevel stenosis. Bilateral foraminal stenosis due to disc herniations at L3-4. Right-sided symptoms primarily affecting the right hip and thigh region. FLUOROSCOPY TIME:  0 minutes 50 seconds. 114.00 micro gray meter squared PROCEDURE: The procedure, risks, benefits, and alternatives were explained to the patient. Questions regarding the procedure were encouraged and answered. The patient understands and consents to the procedure. LUMBAR EPIDURAL INJECTION: An interlaminar approach was performed on right at L4. The overlying skin was cleansed and anesthetized. A 20 gauge epidural needle was advanced using loss-of-resistance technique. DIAGNOSTIC EPIDURAL INJECTION: Injection of Isovue-M 200 shows a good epidural pattern with spread above and below the level of needle placement, primarily on the right, but to both sides. No vascular opacification is seen. THERAPEUTIC EPIDURAL INJECTION: One hundred twenty Mg of Depo-Medrol mixed with 2 cc 1% lidocaine were instilled. The procedure was well-tolerated, and the patient was discharged thirty minutes following the injection in good condition. COMPLICATIONS: None IMPRESSION: Technically successful epidural injection on the right at L4 # 1 Electronically  Signed   By: Paulina Fusi M.D.   On: 06/06/2016 11:10   Dg Hip Unilat With Pelvis 2-3 Views Left  Result Date: 07/03/2016 CLINICAL DATA:  Left hip pain after fall 2 weeks ago. EXAM: DG HIP (WITH OR WITHOUT PELVIS) 2-3V LEFT COMPARISON:  None. FINDINGS: There is no evidence of hip fracture or dislocation. There is no evidence of arthropathy or other focal bone abnormality. IMPRESSION: Normal left hip. Electronically Signed   By: Lupita Raider, M.D.   On: 07/03/2016 10:17    Microbiology: Recent Results (from the past 240 hour(s))  Urine culture     Status: Abnormal   Collection Time: 06/29/16  5:34 PM  Result Value Ref Range Status   Specimen Description URINE, RANDOM  Final   Special Requests NONE  Final   Culture >=100,000 COLONIES/mL KLEBSIELLA SPECIES (A)  Final   Report Status 07/01/2016 FINAL  Final   Organism ID, Bacteria KLEBSIELLA SPECIES (A)  Final      Susceptibility   Klebsiella species - MIC*    AMPICILLIN >=32 RESISTANT Resistant     CEFAZOLIN <=4 SENSITIVE Sensitive     CEFTRIAXONE <=1 SENSITIVE Sensitive     CIPROFLOXACIN <=0.25 SENSITIVE Sensitive     GENTAMICIN <=1 SENSITIVE Sensitive     IMIPENEM <=0.25 SENSITIVE Sensitive     NITROFURANTOIN 128 RESISTANT Resistant     TRIMETH/SULFA <=20 SENSITIVE Sensitive     AMPICILLIN/SULBACTAM 4 SENSITIVE Sensitive     PIP/TAZO <=4 SENSITIVE Sensitive     Extended ESBL NEGATIVE Sensitive     * >=100,000 COLONIES/mL KLEBSIELLA SPECIES     Labs: Basic Metabolic Panel:  Recent Labs Lab 06/30/16 0235 07/01/16 0622 07/02/16 0541 07/03/16 0711 07/04/16 0412  NA 134* 140 140 138 137  K 4.7 4.3 5.1 4.9 4.9  CL 98* 102 98* 98* 96*  CO2 28 31 33* 31 31  GLUCOSE 366* 152* 234* 145* 214*  BUN 72* 59* 61* 55* 64*  CREATININE 2.02* 1.67* 1.99* 2.11* 2.13*  CALCIUM 7.9*  8.4* 8.8* 8.8* 8.5*  MG  --   --  2.3  --   --    Liver Function Tests:  Recent Labs Lab 06/28/16 1051 07/03/16 0711 07/04/16 0412  AST 27 52* 33    ALT 31 50 46  ALKPHOS 73 77 94  BILITOT 0.5 0.6 0.3  PROT 6.1* 6.6 6.4*  ALBUMIN 3.1* 3.3* 3.2*    Recent Labs Lab 06/28/16 1051  LIPASE 24   No results for input(s): AMMONIA in the last 168 hours. CBC:  Recent Labs Lab 06/30/16 0235 07/01/16 0622 07/02/16 0541 07/03/16 0711 07/04/16 0412  WBC 7.4 6.6 6.4 7.3 6.5  HGB 9.5* 9.7* 9.9* 10.8* 10.1*  HCT 29.5* 30.7* 32.2* 35.1* 32.1*  MCV 90.8 91.4 93.6 93.9 93.9  PLT 113* 128* 148* 186 204   Cardiac Enzymes:  Recent Labs Lab 07/01/16 0622  TROPONINI 0.06*   BNP: BNP (last 3 results)  Recent Labs  11/17/15 0750 04/11/16 1522 05/27/16 1020  BNP 982.1* 623.6* 904.6*    ProBNP (last 3 results) No results for input(s): PROBNP in the last 8760 hours.  CBG:  Recent Labs Lab 07/03/16 0811 07/03/16 1205 07/03/16 1724 07/03/16 2110 07/04/16 0802  GLUCAP 169* 318* 244* 288* 243*       SignedAlbertine Grates MD, PhD  [facility] 07/04/2016, 11:34 AM

## 2016-07-04 NOTE — Progress Notes (Signed)
Patient was discharged home by MD order; discharged instructions  review and give to patient and her daughter with care notes; IV DIC; patient will be escorted to the car by a volunteer via wheelchair.  

## 2016-07-05 ENCOUNTER — Ambulatory Visit
Admission: RE | Admit: 2016-07-05 | Discharge: 2016-07-05 | Disposition: A | Discharge status: Home / Self Care | Payer: Medicare Other | Source: Ambulatory Visit | Attending: Sports Medicine | Admitting: Sports Medicine

## 2016-07-05 ENCOUNTER — Encounter: Payer: Self-pay | Admitting: Cardiology

## 2016-07-05 ENCOUNTER — Ambulatory Visit (INDEPENDENT_AMBULATORY_CARE_PROVIDER_SITE_OTHER): Payer: Medicare Other | Admitting: Podiatry

## 2016-07-05 ENCOUNTER — Other Ambulatory Visit: Payer: Self-pay | Admitting: Sports Medicine

## 2016-07-05 DIAGNOSIS — B351 Tinea unguium: Secondary | ICD-10-CM

## 2016-07-05 DIAGNOSIS — M48061 Spinal stenosis, lumbar region without neurogenic claudication: Secondary | ICD-10-CM

## 2016-07-05 DIAGNOSIS — M79676 Pain in unspecified toe(s): Secondary | ICD-10-CM | POA: Diagnosis not present

## 2016-07-05 LAB — URINE CULTURE

## 2016-07-05 MED ORDER — METHYLPREDNISOLONE ACETATE 40 MG/ML INJ SUSP (RADIOLOG
120.0000 mg | Freq: Once | INTRAMUSCULAR | Status: AC
  Administered 2016-07-05: 120 mg via EPIDURAL

## 2016-07-05 MED ORDER — IOPAMIDOL (ISOVUE-M 200) INJECTION 41%
1.0000 mL | Freq: Once | INTRAMUSCULAR | Status: AC
  Administered 2016-07-05: 1 mL via EPIDURAL

## 2016-07-05 NOTE — Progress Notes (Signed)
Patient ID: [name], female   DOB: 06/22/1946, 70 y.o.   MRN: [id number]    Subjective: This patient presents again today complaining of painful toenails when wearing shoes and walking and request debridement of toenails Patient requests refill gabapentin 300 mg taken 1 at bedtime requesting 90 doses per Rx. Initial prescription prescribed by Dr. Charlsie Merlesregal. Patient denies any side effects from gabapentin  Objective: DP and PT pulses 0/4 bilaterally HAV left No open skin lesions bilaterally Toenails 2-5 bilaterally are elongated, hypertrophic, discolored and tender to direct palpation 8  Symptomatic onychomycoses 8 Type II diabetic History of peripheral neuropathy History of peripheral arterial disease  Plan: Debridement toenails 8 mechanically and electrically without any bleeding Reorder gabapentin 300 mg sig one at bedtime #90 refill 1   Reappoint 3 months

## 2016-07-05 NOTE — Patient Instructions (Signed)
Diabetes and Foot Care Diabetes may cause you to have problems because of poor blood supply (circulation) to your feet and legs. This may cause the skin on your feet to become thinner, break easier, and heal more slowly. Your skin may become dry, and the skin may peel and crack. You may also have nerve damage in your legs and feet causing decreased feeling in them. You may not notice minor injuries to your feet that could lead to infections or more serious problems. Taking care of your feet is one of the most important things you can do for yourself.  HOME CARE INSTRUCTIONS  Wear shoes at all times, even in the house. Do not go barefoot. Bare feet are easily injured.  Check your feet daily for blisters, cuts, and redness. If you cannot see the bottom of your feet, use a mirror or ask someone for help.  Wash your feet with warm water (do not use hot water) and mild soap. Then pat your feet and the areas between your toes until they are completely dry. Do not soak your feet as this can dry your skin.  Apply a moisturizing lotion or petroleum jelly (that does not contain alcohol and is unscented) to the skin on your feet and to dry, brittle toenails. Do not apply lotion between your toes.  Trim your toenails straight across. Do not dig under them or around the cuticle. File the edges of your nails with an emery board or nail file.  Do not cut corns or calluses or try to remove them with medicine.  Wear clean socks or stockings every day. Make sure they are not too tight. Do not wear knee-high stockings since they may decrease blood flow to your legs.  Wear shoes that fit properly and have enough cushioning. To break in new shoes, wear them for just a few hours a day. This prevents you from injuring your feet. Always look in your shoes before you put them on to be sure there are no objects inside.  Do not cross your legs. This may decrease the blood flow to your feet.  If you find a minor scrape,  cut, or break in the skin on your feet, keep it and the skin around it clean and dry. These areas may be cleansed with mild soap and water. Do not cleanse the area with peroxide, alcohol, or iodine.  When you remove an adhesive bandage, be sure not to damage the skin around it.  If you have a wound, look at it several times a day to make sure it is healing.  Do not use heating pads or hot water bottles. They may burn your skin. If you have lost feeling in your feet or legs, you may not know it is happening until it is too late.  Make sure your health care provider performs a complete foot exam at least annually or more often if you have foot problems. Report any cuts, sores, or bruises to your health care provider immediately. SEEK MEDICAL CARE IF:   You have an injury that is not healing.  You have cuts or breaks in the skin.  You have an ingrown nail.  You notice redness on your legs or feet.  You feel burning or tingling in your legs or feet.  You have pain or cramps in your legs and feet.  Your legs or feet are numb.  Your feet always feel cold. SEEK IMMEDIATE MEDICAL CARE IF:   There is increasing redness,   swelling, or pain in or around a wound.  There is a red line that goes up your leg.  Pus is coming from a wound.  You develop a fever or as directed by your health care provider.  You notice a bad smell coming from an ulcer or wound.   This information is not intended to replace advice given to you by your health care provider. Make sure you discuss any questions you have with your health care provider.   Document Released: 10/07/2000 Document Revised: 06/12/2013 Document Reviewed: 03/19/2013 Elsevier Interactive Patient Education 2016 Elsevier Inc.  

## 2016-07-05 NOTE — Discharge Instructions (Signed)

## 2016-07-19 ENCOUNTER — Ambulatory Visit: Payer: Medicare Other | Admitting: Cardiology

## 2016-08-03 ENCOUNTER — Encounter: Payer: Self-pay | Admitting: Physician Assistant

## 2016-08-03 ENCOUNTER — Ambulatory Visit (INDEPENDENT_AMBULATORY_CARE_PROVIDER_SITE_OTHER): Payer: Medicare Other | Admitting: Physician Assistant

## 2016-08-03 VITALS — BP 104/40 | HR 81 | Ht 62.0 in | Wt 164.0 lb

## 2016-08-03 DIAGNOSIS — I10 Essential (primary) hypertension: Secondary | ICD-10-CM | POA: Diagnosis not present

## 2016-08-03 DIAGNOSIS — E7800 Pure hypercholesterolemia, unspecified: Secondary | ICD-10-CM

## 2016-08-03 DIAGNOSIS — I2510 Atherosclerotic heart disease of native coronary artery without angina pectoris: Secondary | ICD-10-CM

## 2016-08-03 DIAGNOSIS — I255 Ischemic cardiomyopathy: Secondary | ICD-10-CM | POA: Diagnosis not present

## 2016-08-03 DIAGNOSIS — I5042 Chronic combined systolic (congestive) and diastolic (congestive) heart failure: Secondary | ICD-10-CM

## 2016-08-03 DIAGNOSIS — N183 Chronic kidney disease, stage 3 unspecified: Secondary | ICD-10-CM

## 2016-08-03 DIAGNOSIS — I739 Peripheral vascular disease, unspecified: Secondary | ICD-10-CM

## 2016-08-03 DIAGNOSIS — I779 Disorder of arteries and arterioles, unspecified: Secondary | ICD-10-CM

## 2016-08-03 DIAGNOSIS — Z8719 Personal history of other diseases of the digestive system: Secondary | ICD-10-CM

## 2016-08-03 NOTE — Progress Notes (Signed)
Cardiology Office Note:    Date:  08/03/2016   ID:  [name], DOB 1945-12-10, MRN [id number]  PCP:  Cala Bradford, MD  Cardiologist:  Dr. Donato Schultz   Electrophysiologist:  n/a  Referring MD: Laurann Montana, MD   Chief Complaint  Patient presents with  . Follow-up    CAD, CHF    History of Present Illness:    [name] is a 70 y.o. female with a hx of CAD status post CABG, ischemic cardiomyopathy, chronic systolic CHF, COPD, diabetes, PAD, HL, CKD stage III, HTN. EF has been as low as 20% in the past. Echo in 3/16 demonstrated EF 50%. LHC in 3/16 demonstrated patent left main, occluded LAD and RCA, nonobstructive disease in the LCx, occluded SVG-RCA, patent SVG to the LAD with 70% stenosis beyond graft. There were no good target for PCI and medical therapy was recommended. She is on chronic O2 secondary to COPD and systolic HF. She is status post left LE bypass by Dr. Darrick Penna. 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   Admitted in 10/16 with a/c hypoxic respiratory failure in the setting of a/c combined systolic and diastolic HF and tracheobronchitis. EF was 35-40% by Echo at that time.   Admitted in 1/17 with acute on chronic combined systolic and diastolic HF.  Last seen by me in 6/17.  Admitted 9/5-9/11 with LGI bleed.  Source was felt to be diverticular.  She was transfused with 2 units PRBCs. Medications were resumed prior to DC.  Plavix was held for 7 days.  ECG changes were noted during her admission and OP cardiology FU was recommended.  Of, echocardiogram demonstrated EF 30-35% and mild diastolic dysfunction. She returns for follow-up. Since DC from the hospital, she denies chest discomfort, significant changes in her chronic dyspnea. She denies orthopnea, PND. She denies LE edema. She denies syncope. Her biggest complaint is that of left hip and leg pain. This started when she fell in August of this year. She apparently saw orthopedics  as well as her primary care physician. She had a normal L hip xray.    Prior CV studies that were reviewed today include:    Echo 07/02/16 Mild LVH, EF 30-35, diffuse HK, grade 1 diastolic dysfunction, inferior akinesis, MAC, moderate LAE, moderately reduced RVSF, mild TR, PASP 31  Echo 07/28/15 Moderate LVH, EF 35-40%, inferior AK, grade 1 diastolic dysfunction, MAC, mild MR, moderate LAE  Carotid US 4/16 R 40-59%; L CEA patent with 40-59%   LHC 3/16 EF 45%, inf HK LM: Ok LAD: Ostial 100% LCx: Ostial 40%, branch vessel 50% (not amenable to PCI) RCA: prox 100% after RV, 90% prior to RV branch, RV branch with 90% S-RCA: Chronically occluded S-mLAD: Patent, inferoapical LAD beyond graft 70% POST-OPERATIVE DIAGNOSIS:   Severe Native & graft CAD with 100% RCA, LAD & small AV Groove Cx along with SVG-RCA  Widely patent SVG-distal Mid Wraparound LAD with severe diffuse proximal LAD disease limiting retrograde flow.  Widely patent native Ramus vs. Large Lateral OM with 2 large bifurcating branches that have diffuse moderate disease.  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  Myoview 3/16 Final Impression: High risk nuclear stress test. LVEF 25%, dilated LV with severe global hypokinesis and anteroapical akinesis. There is a moderate-sized and intensity, partially reversible anterior perfusion defect which could represent scar with peri-infarct ischemia or significant hybernating myocardium.  Echo 3/16 Inf-lat HK, inf HK, Mod LVH, EF 50%, Ao sclerosis, mild LAE, mild to mod reduced RVSF, mild RAE, PASP 35  mmHg   Past Medical History:  Diagnosis Date  . Anemia   . Arthritis    "hands, back" (06/28/2016)  . BRBPR (bright red blood per rectum) 06/28/2016  . CAD (coronary artery disease)    a. S/p CABG and stenting // b. Myoview 3/16: high risk EF 25%, ant-apical AK, ant scar with peri-infarct ischemia // c. LHC 3/16 - EF 45%, inferior hypokinesis, oLAD 100, oLCx 40, branch vessel 50, pRCA 100 dRCA , RV br 90, S-RCA occluded, S-LAD patent with inf-apical LAD beyond graft 70 >> med Rx  . Carotid artery disease (HCC)    a. s/p L CEA // b. Carotid US 4/16: R 40-59%; L CEA patent with 40-59%  . Chronic combined systolic and diastolic CHF (congestive heart failure) (HCC)    a. 11/2011 Echo with EF 30-35%, global hypokinesis, and inferior akinesis;  b. 12/2014 Echo: EF 50%, mod LVH, Ao sclerosis w/o stenosis, mildly dil LA, mild to mod RV dysfxn. // c. Echo 10/16: Moderate LVH, EF 35-40%, inferior AK, grade 1 diastolic dysfunction, MAC, mild MR, moderate LAE  . Chronic lower back pain   . CKD (chronic kidney disease), stage III   . COPD (chronic obstructive pulmonary disease) (HCC)    a. prn and HS supplemental O2.  . DDD (degenerative disc disease), lumbar   . Depression   . GERD (gastroesophageal reflux disease)   . History of blood transfusion ~ 2015; 06/28/2016   "anemia"  . History of IBS   . History of tracheostomy 08/26/2013   Dr. Jenne Pane  . Hyperlipidemia   . Hypertension   . Hypothyroidism    Goiter  . MI (myocardial infarction) 1997  . On home oxygen therapy    "4L at night & prn during the day" (06/28/2016)  . PAD (peripheral artery disease) (HCC)   . Stroke [facility])    MRI 11/2011 with remote occipital lobe. MRA with moderate left focal vertebral artery stenosis  . Type II diabetes mellitus (HCC)     Past Surgical History:  Procedure Laterality Date  . ABDOMINAL AORTAGRAM N/A 04/05/2013   Procedure: ABDOMINAL Ronny Flurry;  Surgeon: Sherren Kerns, MD;  Location: [facility] CATH LAB;  Service:  Cardiovascular;  Laterality: N/A;  . [id number]  [id number]   Aortogram by Dr. [place] McKenzie [facility])  . CAROTID ENDARTERECTOMY Left ~2008  . CARPAL TUNNEL RELEASE Left   . CATARACT EXTRACTION W/ INTRAOCULAR LENS IMPLANT & ANTERIOR VITRECTOMY, BILATERAL  ~2007-02/2016   "left-right"  . CHOLECYSTECTOMY OPEN    . COLONOSCOPY N/A 10/27/2014   Procedure: COLONOSCOPY;  Surgeon: Willis Modena, MD;  Location: [facility] ENDOSCOPY;  Service: Endoscopy;  Laterality: N/A;  . CORONARY ARTERY BYPASS GRAFT     2 vessel  . DILATION AND CURETTAGE OF UTERUS    . FEMORAL-TIBIAL BYPASS GRAFT Left 06/11/2013   Procedure: BYPASS GRAFT  LEFT FEMORAL- POSTERIOR TIBIAL ARTERY/ REDO;  Surgeon: Sherren Kerns, MD;  Location: [facility] OR;  Service: Vascular;  Laterality: Left;  . LEFT AND RIGHT HEART CATHETERIZATION WITH CORONARY ANGIOGRAM N/A 01/09/2015   Procedure: LEFT AND RIGHT HEART  CATHETERIZATION WITH CORONARY ANGIOGRAM;  Surgeon: Marykay Lex, MD;  Location: [facility] CATH LAB;  Service: Cardiovascular;  Laterality: N/A;  . PR VEIN BYPASS GRAFT,AORTO-FEM-POP     Right common femoral-AK popliteal BPG & Right Popliteal-posterior tibial  . PR VEIN BYPASS GRAFT,AORTO-FEM-POP     Left Fem-pop BPG  . PTCA    . SPINE SURGERY  Oct. 27, 2014   Injection - Back  . THROMBECTOMY FEMORAL ARTERY Left 06/11/2013   Procedure: THROMBECTOMY FEMORAL ARTERY;  Surgeon: Sherren Kerns, MD;  Location: [facility] OR;  Service: Vascular;  Laterality: Left;  . THYROIDECTOMY    . TRACHEOSTOMY TUBE PLACEMENT  01/02/2012  . TUBAL LIGATION      Current Medications: Current Meds  Medication Sig  . acetaminophen (TYLENOL) 500 MG tablet Take 1,000 mg by mouth daily as needed for mild pain or headache.   Marland Kitchen aspirin EC 81 MG tablet Take 81 mg by mouth daily.  . carvedilol (COREG) 12.5 MG tablet Take 1 tablet (12.5 mg total) by mouth 2 (two) times daily with a meal.  . clopidogrel (PLAVIX) 75 MG tablet Take 1 tablet (75 mg total) by mouth daily. Hold for  two more days, restart on 9/12  . dicyclomine (BENTYL) 20 MG tablet Take 20 mg by mouth 3 (three) times daily before meals.  . DULoxetine (CYMBALTA) 60 MG capsule Take 60 mg by mouth at bedtime.   . ferrous sulfate 325 (65 FE) MG tablet Take 325 mg by mouth daily with breakfast.  . gabapentin (NEURONTIN) 300 MG capsule Take 1 capsule (300 mg total) by mouth at bedtime.  Marland Kitchen glucose ([employer] GLUCOSE) 4 GM chewable tablet Chew 1 tablet by mouth as needed for low blood sugar.  . hydrALAZINE (APRESOLINE) 25 MG tablet Take 1 tablet (25 mg total) by mouth 3 (three) times daily.  Marland Kitchen HYDROcodone-acetaminophen (NORCO) 10-325 MG tablet Take 0.5-1 tablets by mouth every 6 (six) hours as needed for moderate pain.   . Insulin Degludec (TRESIBA FLEXTOUCH) 100 UNIT/ML SOPN Inject 22 Units into the skin at bedtime.   Marland Kitchen ipratropium-albuterol (DUONEB) 0.5-2.5 (3) MG/3ML SOLN Take 3 mLs by nebulization 2 (two) times daily as needed (for shortnes of breath).   . isosorbide mononitrate (IMDUR) 30 MG 24 hr tablet Take 15 mg by mouth daily.  . lansoprazole (PREVACID) 30 MG capsule Take 30 mg by mouth 2 (two) times daily.  Marland Kitchen levothyroxine (SYNTHROID, LEVOTHROID) 75 MCG tablet Take 75 mcg by mouth daily before breakfast.   . Multiple Vitamins-Minerals (WOMENS 50+ MULTI VITAMIN/MIN) TABS Take 1 tablet by mouth daily.  . nitroGLYCERIN (NITROSTAT) 0.4 MG SL tablet Place 0.4 mg under the tongue every 5 (five) minutes as needed for chest pain (x 3 doses). For chest pain  . NOVOLOG FLEXPEN 100 UNIT/ML FlexPen 70 -199 TAKE 12 UNITS, 200-299 TAKE 14 UNITS, 300 OR HIGHER 16 UNITS SUBCUTANEOUS (12-16 units subcutaneously three (3) times a day)  . OXYGEN-HELIUM IN Inhale 4 L into the lungs at bedtime.   . polyethylene glycol (MIRALAX / GLYCOLAX) packet Take 17 g by mouth daily.  . Probiotic Product (PROBIOTIC PO) Take 1 tablet by mouth daily.   Marland Kitchen senna-docusate (SENOKOT-S) 8.6-50 MG tablet Take 2 tablets by mouth at bedtime.  .  simvastatin (ZOCOR) 40 MG tablet Take 40 mg by mouth every evening.   Marland Kitchen tiZANidine (ZANAFLEX) 2 MG tablet Take 2 mg by mouth daily.  Marland Kitchen torsemide (DEMADEX) 20 MG tablet TAKE 4 TABLETS (80 MG TOTAL) BY MOUTH  ONCE WEEKLY     Allergies:   Aldactone [spironolactone]; Lisinopril; Crestor [rosuvastatin calcium]; and Vicodin [hydrocodone-acetaminophen]   Social History   Social History  . Marital status: Single    Spouse name: N/A  . Number of children: 3  . Years of education: N/A   Occupational History  . Retired    Social History Main Topics  . Smoking status: Former Smoker    Packs/day: 1.00    Years: 50.00    Types: Cigarettes    Quit date: 01/24/2012  . Smokeless tobacco: Never Used  . Alcohol use No  . Drug use: No  . Sexual activity: No   Other Topics Concern  . None   Social History Narrative   Retired Advertising account executive.  Worked at the [employer] on [employer].  Formerly lived in [place] area.  2 daughters, Living here with her daughter in [place] starting end of January 2013. [name]. Ambulatory previously with a cane, but none now.     Family History:  The patient's family history includes Alzheimer's disease in her mother; Diabetes in her daughter; Heart disease in her mother; Hyperlipidemia in her mother; Hypertension in her daughter; Irregular heart beat in her mother; Other in her mother.   ROS:   Please see the history of present illness.    ROS All other systems reviewed and are negative.   EKGs/Labs/Other Test Reviewed:    EKG:  EKG is  ordered today.  The ekg ordered today demonstrates NSR, HR 71, LAD, LVH, first-degree AV block with PR 252 ms, T-wave inversions 1, aVL, V6, QTc 475 ms, septal Q waves, similar to prior tracings  Recent Labs: 05/27/2016: Brain Natriuretic Peptide 904.6 07/02/2016: Magnesium 2.3; TSH 4.143 07/04/2016: ALT 46; BUN 64; Creatinine, Ser 2.13; Hemoglobin 10.1; Platelets 204; Potassium 4.9; Sodium 137   Recent  Lipid Panel    Component Value Date/Time   CHOL 134 01/07/2015 0543   TRIG 122 01/07/2015 0543   HDL 36 (L) 01/07/2015 0543   CHOLHDL 3.7 01/07/2015 0543   VLDL 24 01/07/2015 0543   LDLCALC 74 01/07/2015 0543     Physical Exam:    VS:  BP (!) 104/40   Pulse 81   Ht 5\' 2"  (1.575 m)   Wt 164 lb (74.4 kg)   SpO2 95%   BMI 30.00 kg/m     Wt Readings from Last 3 Encounters:  08/03/16 164 lb (74.4 kg)  07/04/16 156 lb 14.4 oz (71.2 kg)  06/12/16 170 lb (77.1 kg)     Physical Exam  Constitutional: She is oriented to person, place, and time. She appears well-developed and well-nourished. No distress.  HENT:  Head: Normocephalic and atraumatic.  Eyes: No scleral icterus.  Neck: No JVD present.  Cardiovascular: Normal rate, regular rhythm and normal heart sounds.   No murmur heard. Pulmonary/Chest: She has decreased breath sounds. She has no wheezes. She has no rales.  Abdominal: Soft. There is no tenderness.  Musculoskeletal: She exhibits no edema.  Neurological: She is alert and oriented to person, place, and time.  Skin: Skin is warm and dry.  Psychiatric: She has a normal mood and affect.    ASSESSMENT:    1. Chronic combined systolic and diastolic heart failure (HCC)   2. Ischemic cardiomyopathy   3. Coronary artery disease involving native coronary artery of native heart without angina pectoris   4. Essential hypertension   5. Pure hypercholesterolemia   6. Chronic kidney disease, stage 3  7. PAD (peripheral artery disease) (HCC)   8. Bilateral carotid artery disease (HCC)   9. History of GI bleed    PLAN:    In order of problems listed above:  1. Chronic Combined Systolic and Diastolic CHF: Volume appears stable.  Continue current Rx.   2. Ischemic Cardiomyopathy: Recent EF 30-35%. Continue beta-blocker, hydralazine, nitrates. ARB has been DC'd in the past due to worsening renal function. Consider repeat Echo in 3 months to re-assess LVF.    3. CAD  S/P CABG: Status post myocardial infarction in 1997 and subsequent CABG. Cardiac catheterization 3/16 with severe native three-vessel disease, occluded SVG-RCA and patent SVG-LAD. Anatomy was felt to be stable and medical therapy was continued. She denies anginal symptoms. There was some concern about her ECG in the hospital but today's ECG is unchanged.  A nuclear stress test will be abnormal given her known disease as noted in 3/16.  A repeat nuclear stress test will, therefore, not be helpful.  Continue ASA, Plavix, statin, beta-blocker, nitrates.  4. HTN: Her blood pressure is at target.   5. Hyperlipidemia: Continue simvastatin.  6. CKD - Recent creatinines in the hospital have been stable.   7. PAD - Her leg pain seems to be related to her hip injury. I do not see any tissue damage/loss.  Her DP/PT are palpable.  I have asked her FU with Dr. Darrick PennaFields sooner to get her FU vascular studies.    8. Carotid artery disease - She is s/p L CEA. She is followed by [facility].  She has a prior hx of TIA.  9. GI bleed - She has no recurrent signs of bleeding. She is on Plavix + ASA for multiple reasons.  She has a hx of CAD and CABG with no targets for PCI in 3/16.  She has a hx of TIA and a hx of L CEA.  She has a hx of PAD s/p LE bypass. I am not certain we should stop Plavix.  I will review with Dr. Anne FuSkains for his opinion.     Medication Adjustments/Labs and Tests Ordered: Current medicines are reviewed at length with the patient today.  Concerns regarding medicines are outlined above.  Medication changes, Labs and Tests ordered today are outlined in the Patient Instructions noted below. Patient Instructions  Medication Instructions:  Your physician recommends that you continue on your current medications as directed. Please refer to the Current Medication list given to you today.   Labwork: Nne  Testing/Procedures: None  Follow-Up: You have an appointment scheduled with Dr. Anne FuSkains on  September 30, 2016 at 3:45PM. Please arrive 15 minutes prior to appointment.  Any Other Special Instructions Will Be Listed Below (If Applicable).  If you need a refill on your cardiac medications before your next appointment, please call your pharmacy.  Signed, Tereso NewcomerScott Brieonna Crutcher, PA-C  08/03/2016 5:13 PM    [facility] [address], [place], [place]  [address] Phone: [phone]; Fax: [phone]

## 2016-08-03 NOTE — Patient Instructions (Addendum)
Medication Instructions:  Your physician recommends that you continue on your current medications as directed. Please refer to the Current Medication list given to you today.   Labwork: Nne  Testing/Procedures: None  Follow-Up: You have an appointment scheduled with Dr. Anne FuSkains on September 30, 2016 at 3:45PM. Please arrive 15 minutes prior to appointment.  Any Other Special Instructions Will Be Listed Below (If Applicable).  If you need a refill on your cardiac medications before your next appointment, please call your pharmacy.

## 2016-08-04 ENCOUNTER — Telehealth: Payer: Self-pay | Admitting: Physician Assistant

## 2016-08-04 NOTE — Progress Notes (Signed)
Reviewed with Dr. Donato SchultzMark Skains. Will stop Plavix. Continue ASA. Tereso NewcomerScott Eleonore Shippee, PA-C   08/04/2016 9:28 AM

## 2016-08-04 NOTE — Telephone Encounter (Signed)
Notified the pt that per Tereso NewcomerScott Weaver PA-C and Dr Anne FuSkains, they both advise that the pt stop taking Plavix now, and continue taking her 2781 MG ASA daily.  Informed the pt that both Scott and Dr Anne FuSkains are doing this, because of her recent GI bleed.  Discontinued this out of the pts med list. Pt verbalized understanding and agrees with this plan.

## 2016-08-04 NOTE — Progress Notes (Signed)
Scott, Given GI bleed, would stop Plavix. Continue ASA  Donato SchultzMark Mariabelen Pressly, MD

## 2016-08-04 NOTE — Telephone Encounter (Signed)
Please tell Scot Junorothy I Chuang that I reviewed her case with Dr. Donato SchultzMark Skains. Because of her recent GI bleed, we want her to stop taking Plavix. She should remain on ASA. Tereso NewcomerScott Shynia Daleo, PA-C   08/04/2016 9:29 AM

## 2016-08-16 ENCOUNTER — Other Ambulatory Visit: Payer: Self-pay | Admitting: Sports Medicine

## 2016-08-16 DIAGNOSIS — M25552 Pain in left hip: Secondary | ICD-10-CM

## 2016-08-27 ENCOUNTER — Ambulatory Visit
Admission: RE | Admit: 2016-08-27 | Discharge: 2016-08-27 | Disposition: A | Discharge status: Home / Self Care | Payer: Medicare Other | Source: Ambulatory Visit | Attending: Sports Medicine | Admitting: Sports Medicine

## 2016-08-27 DIAGNOSIS — M25552 Pain in left hip: Secondary | ICD-10-CM

## 2016-09-02 ENCOUNTER — Emergency Department (HOSPITAL_COMMUNITY): Payer: Medicare Other

## 2016-09-02 ENCOUNTER — Inpatient Hospital Stay (HOSPITAL_COMMUNITY): Payer: Medicare Other

## 2016-09-02 ENCOUNTER — Emergency Department (HOSPITAL_COMMUNITY)
Admit: 2016-09-02 | Discharge: 2016-09-02 | Disposition: A | Discharge status: Home / Self Care | Payer: Medicare Other | Attending: Emergency Medicine | Admitting: Emergency Medicine

## 2016-09-02 ENCOUNTER — Encounter (HOSPITAL_COMMUNITY): Payer: Self-pay | Admitting: Emergency Medicine

## 2016-09-02 ENCOUNTER — Inpatient Hospital Stay (HOSPITAL_COMMUNITY)
Admission: EM | Admit: 2016-09-02 | Discharge: 2016-09-09 | DRG: 299 | Disposition: A | Discharge status: Skilled Nursing Facility | Payer: Medicare Other | Attending: Internal Medicine | Admitting: Internal Medicine

## 2016-09-02 DIAGNOSIS — J386 Stenosis of larynx: Secondary | ICD-10-CM | POA: Diagnosis present

## 2016-09-02 DIAGNOSIS — J9621 Acute and chronic respiratory failure with hypoxia: Secondary | ICD-10-CM | POA: Diagnosis present

## 2016-09-02 DIAGNOSIS — Z79899 Other long term (current) drug therapy: Secondary | ICD-10-CM

## 2016-09-02 DIAGNOSIS — F329 Major depressive disorder, single episode, unspecified: Secondary | ICD-10-CM | POA: Diagnosis present

## 2016-09-02 DIAGNOSIS — E1165 Type 2 diabetes mellitus with hyperglycemia: Secondary | ICD-10-CM | POA: Diagnosis present

## 2016-09-02 DIAGNOSIS — E11649 Type 2 diabetes mellitus with hypoglycemia without coma: Secondary | ICD-10-CM | POA: Diagnosis not present

## 2016-09-02 DIAGNOSIS — Z8673 Personal history of transient ischemic attack (TIA), and cerebral infarction without residual deficits: Secondary | ICD-10-CM

## 2016-09-02 DIAGNOSIS — E890 Postprocedural hypothyroidism: Secondary | ICD-10-CM | POA: Diagnosis present

## 2016-09-02 DIAGNOSIS — R410 Disorientation, unspecified: Secondary | ICD-10-CM

## 2016-09-02 DIAGNOSIS — G9340 Encephalopathy, unspecified: Secondary | ICD-10-CM | POA: Diagnosis present

## 2016-09-02 DIAGNOSIS — J449 Chronic obstructive pulmonary disease, unspecified: Secondary | ICD-10-CM | POA: Diagnosis present

## 2016-09-02 DIAGNOSIS — Z8719 Personal history of other diseases of the digestive system: Secondary | ICD-10-CM

## 2016-09-02 DIAGNOSIS — K219 Gastro-esophageal reflux disease without esophagitis: Secondary | ICD-10-CM | POA: Diagnosis present

## 2016-09-02 DIAGNOSIS — E1122 Type 2 diabetes mellitus with diabetic chronic kidney disease: Secondary | ICD-10-CM | POA: Diagnosis present

## 2016-09-02 DIAGNOSIS — I82409 Acute embolism and thrombosis of unspecified deep veins of unspecified lower extremity: Secondary | ICD-10-CM

## 2016-09-02 DIAGNOSIS — I10 Essential (primary) hypertension: Secondary | ICD-10-CM | POA: Diagnosis present

## 2016-09-02 DIAGNOSIS — Z930 Tracheostomy status: Secondary | ICD-10-CM | POA: Diagnosis not present

## 2016-09-02 DIAGNOSIS — Z794 Long term (current) use of insulin: Secondary | ICD-10-CM

## 2016-09-02 DIAGNOSIS — E039 Hypothyroidism, unspecified: Secondary | ICD-10-CM | POA: Diagnosis present

## 2016-09-02 DIAGNOSIS — M545 Low back pain: Secondary | ICD-10-CM | POA: Diagnosis present

## 2016-09-02 DIAGNOSIS — I5043 Acute on chronic combined systolic (congestive) and diastolic (congestive) heart failure: Secondary | ICD-10-CM | POA: Diagnosis present

## 2016-09-02 DIAGNOSIS — M6281 Muscle weakness (generalized): Secondary | ICD-10-CM

## 2016-09-02 DIAGNOSIS — I82441 Acute embolism and thrombosis of right tibial vein: Secondary | ICD-10-CM | POA: Diagnosis present

## 2016-09-02 DIAGNOSIS — R0902 Hypoxemia: Secondary | ICD-10-CM | POA: Diagnosis present

## 2016-09-02 DIAGNOSIS — I824Z1 Acute embolism and thrombosis of unspecified deep veins of right distal lower extremity: Secondary | ICD-10-CM | POA: Diagnosis not present

## 2016-09-02 DIAGNOSIS — R9431 Abnormal electrocardiogram [ECG] [EKG]: Secondary | ICD-10-CM | POA: Diagnosis present

## 2016-09-02 DIAGNOSIS — M79604 Pain in right leg: Secondary | ICD-10-CM

## 2016-09-02 DIAGNOSIS — Z9981 Dependence on supplemental oxygen: Secondary | ICD-10-CM | POA: Diagnosis not present

## 2016-09-02 DIAGNOSIS — I252 Old myocardial infarction: Secondary | ICD-10-CM

## 2016-09-02 DIAGNOSIS — K589 Irritable bowel syndrome without diarrhea: Secondary | ICD-10-CM | POA: Diagnosis present

## 2016-09-02 DIAGNOSIS — Z961 Presence of intraocular lens: Secondary | ICD-10-CM | POA: Diagnosis present

## 2016-09-02 DIAGNOSIS — I700 Atherosclerosis of aorta: Secondary | ICD-10-CM | POA: Diagnosis present

## 2016-09-02 DIAGNOSIS — S76012A Strain of muscle, fascia and tendon of left hip, initial encounter: Secondary | ICD-10-CM | POA: Diagnosis present

## 2016-09-02 DIAGNOSIS — E1151 Type 2 diabetes mellitus with diabetic peripheral angiopathy without gangrene: Secondary | ICD-10-CM | POA: Diagnosis present

## 2016-09-02 DIAGNOSIS — N390 Urinary tract infection, site not specified: Secondary | ICD-10-CM | POA: Diagnosis present

## 2016-09-02 DIAGNOSIS — G8929 Other chronic pain: Secondary | ICD-10-CM | POA: Diagnosis present

## 2016-09-02 DIAGNOSIS — E785 Hyperlipidemia, unspecified: Secondary | ICD-10-CM | POA: Diagnosis present

## 2016-09-02 DIAGNOSIS — N184 Chronic kidney disease, stage 4 (severe): Secondary | ICD-10-CM | POA: Diagnosis present

## 2016-09-02 DIAGNOSIS — I5042 Chronic combined systolic (congestive) and diastolic (congestive) heart failure: Secondary | ICD-10-CM | POA: Diagnosis present

## 2016-09-02 DIAGNOSIS — Z8249 Family history of ischemic heart disease and other diseases of the circulatory system: Secondary | ICD-10-CM

## 2016-09-02 DIAGNOSIS — Z87891 Personal history of nicotine dependence: Secondary | ICD-10-CM

## 2016-09-02 DIAGNOSIS — D649 Anemia, unspecified: Secondary | ICD-10-CM | POA: Diagnosis present

## 2016-09-02 DIAGNOSIS — M79609 Pain in unspecified limb: Secondary | ICD-10-CM

## 2016-09-02 DIAGNOSIS — Z888 Allergy status to other drugs, medicaments and biological substances status: Secondary | ICD-10-CM

## 2016-09-02 DIAGNOSIS — Z885 Allergy status to narcotic agent status: Secondary | ICD-10-CM

## 2016-09-02 DIAGNOSIS — I2510 Atherosclerotic heart disease of native coronary artery without angina pectoris: Secondary | ICD-10-CM | POA: Diagnosis present

## 2016-09-02 DIAGNOSIS — Z7982 Long term (current) use of aspirin: Secondary | ICD-10-CM

## 2016-09-02 DIAGNOSIS — R0600 Dyspnea, unspecified: Secondary | ICD-10-CM

## 2016-09-02 DIAGNOSIS — Z833 Family history of diabetes mellitus: Secondary | ICD-10-CM

## 2016-09-02 DIAGNOSIS — Z951 Presence of aortocoronary bypass graft: Secondary | ICD-10-CM

## 2016-09-02 DIAGNOSIS — IMO0002 Reserved for concepts with insufficient information to code with codable children: Secondary | ICD-10-CM | POA: Diagnosis present

## 2016-09-02 DIAGNOSIS — Z955 Presence of coronary angioplasty implant and graft: Secondary | ICD-10-CM

## 2016-09-02 LAB — I-STAT ARTERIAL BLOOD GAS, ED
Acid-Base Excess: 4 mmol/L — ABNORMAL HIGH (ref 0.0–2.0)
BICARBONATE: 29.9 mmol/L — AB (ref 20.0–28.0)
O2 Saturation: 94 %
PCO2 ART: 51.4 mmHg — AB (ref 32.0–48.0)
TCO2: 31 mmol/L (ref 0–100)
pH, Arterial: 7.37 (ref 7.350–7.450)
pO2, Arterial: 73 mmHg — ABNORMAL LOW (ref 83.0–108.0)

## 2016-09-02 LAB — CBC WITH DIFFERENTIAL/PLATELET
BASOS ABS: 0 10*3/uL (ref 0.0–0.1)
BASOS PCT: 0 %
EOS ABS: 0.3 10*3/uL (ref 0.0–0.7)
Eosinophils Relative: 4 %
HCT: 33.1 % — ABNORMAL LOW (ref 36.0–46.0)
HEMOGLOBIN: 10.3 g/dL — AB (ref 12.0–15.0)
Lymphocytes Relative: 13 %
Lymphs Abs: 1 10*3/uL (ref 0.7–4.0)
MCH: 29.9 pg (ref 26.0–34.0)
MCHC: 31.1 g/dL (ref 30.0–36.0)
MCV: 96.2 fL (ref 78.0–100.0)
Monocytes Absolute: 0.8 10*3/uL (ref 0.1–1.0)
Monocytes Relative: 10 %
NEUTROS ABS: 5.6 10*3/uL (ref 1.7–7.7)
NEUTROS PCT: 73 %
Platelets: 184 10*3/uL (ref 150–400)
RBC: 3.44 MIL/uL — AB (ref 3.87–5.11)
RDW: 17.4 % — ABNORMAL HIGH (ref 11.5–15.5)
WBC: 7.7 10*3/uL (ref 4.0–10.5)

## 2016-09-02 LAB — I-STAT CHEM 8, ED
BUN: 39 mg/dL — ABNORMAL HIGH (ref 6–20)
Calcium, Ion: 1.13 mmol/L — ABNORMAL LOW (ref 1.15–1.40)
Chloride: 105 mmol/L (ref 101–111)
Creatinine, Ser: 1.5 mg/dL — ABNORMAL HIGH (ref 0.44–1.00)
Glucose, Bld: 101 mg/dL — ABNORMAL HIGH (ref 65–99)
HEMATOCRIT: 31 % — AB (ref 36.0–46.0)
HEMOGLOBIN: 10.5 g/dL — AB (ref 12.0–15.0)
Potassium: 4 mmol/L (ref 3.5–5.1)
SODIUM: 145 mmol/L (ref 135–145)
TCO2: 29 mmol/L (ref 0–100)

## 2016-09-02 LAB — GLUCOSE, CAPILLARY
GLUCOSE-CAPILLARY: 226 mg/dL — AB (ref 65–99)
Glucose-Capillary: 194 mg/dL — ABNORMAL HIGH (ref 65–99)

## 2016-09-02 LAB — PHOSPHORUS: Phosphorus: 4.4 mg/dL (ref 2.5–4.6)

## 2016-09-02 LAB — BASIC METABOLIC PANEL
ANION GAP: 7 (ref 5–15)
BUN: 37 mg/dL — ABNORMAL HIGH (ref 6–20)
CALCIUM: 8.6 mg/dL — AB (ref 8.9–10.3)
CO2: 28 mmol/L (ref 22–32)
CREATININE: 1.37 mg/dL — AB (ref 0.44–1.00)
Chloride: 108 mmol/L (ref 101–111)
GFR calc non Af Amer: 38 mL/min — ABNORMAL LOW (ref >60)
GFR, EST AFRICAN AMERICAN: 44 mL/min — AB (ref >60)
Glucose, Bld: 128 mg/dL — ABNORMAL HIGH (ref 65–99)
Potassium: 4.3 mmol/L (ref 3.5–5.1)
SODIUM: 143 mmol/L (ref 135–145)

## 2016-09-02 LAB — MAGNESIUM: MAGNESIUM: 2.2 mg/dL (ref 1.7–2.4)

## 2016-09-02 LAB — CK: Total CK: 162 U/L (ref 38–234)

## 2016-09-02 LAB — PROTIME-INR
INR: 1.07
PROTHROMBIN TIME: 13.9 s (ref 11.4–15.2)

## 2016-09-02 LAB — BRAIN NATRIURETIC PEPTIDE: B Natriuretic Peptide: 698.3 pg/mL — ABNORMAL HIGH (ref 0.0–100.0)

## 2016-09-02 MED ORDER — ISOSORBIDE MONONITRATE ER 30 MG PO TB24
15.0000 mg | ORAL_TABLET | Freq: Every day | ORAL | Status: DC
  Administered 2016-09-02 – 2016-09-09 (×8): 15 mg via ORAL
  Filled 2016-09-02 (×8): qty 1

## 2016-09-02 MED ORDER — FUROSEMIDE 10 MG/ML IJ SOLN
80.0000 mg | Freq: Once | INTRAMUSCULAR | Status: AC
  Administered 2016-09-02: 80 mg via INTRAVENOUS
  Filled 2016-09-02: qty 8

## 2016-09-02 MED ORDER — PANTOPRAZOLE SODIUM 20 MG PO TBEC
20.0000 mg | DELAYED_RELEASE_TABLET | Freq: Every day | ORAL | Status: DC
Start: 2016-09-02 — End: 2016-09-09
  Administered 2016-09-02 – 2016-09-09 (×8): 20 mg via ORAL
  Filled 2016-09-02 (×8): qty 1

## 2016-09-02 MED ORDER — POLYETHYLENE GLYCOL 3350 17 G PO PACK: 17.0000 g | PACK | Freq: Every day | ORAL | Status: DC | PRN

## 2016-09-02 MED ORDER — IPRATROPIUM-ALBUTEROL 0.5-2.5 (3) MG/3ML IN SOLN
3.0000 mL | Freq: Once | RESPIRATORY_TRACT | Status: AC
  Administered 2016-09-02: 3 mL via RESPIRATORY_TRACT
  Filled 2016-09-02: qty 3

## 2016-09-02 MED ORDER — WARFARIN - PHARMACIST DOSING INPATIENT
Freq: Every day | Status: DC
  Administered 2016-09-03 – 2016-09-08 (×4)

## 2016-09-02 MED ORDER — TRAMADOL HCL 50 MG PO TABS
50.0000 mg | ORAL_TABLET | Freq: Four times a day (QID) | ORAL | Status: DC | PRN
  Administered 2016-09-03 – 2016-09-09 (×9): 50 mg via ORAL
  Filled 2016-09-02 (×9): qty 1

## 2016-09-02 MED ORDER — INSULIN GLARGINE 100 UNIT/ML ~~LOC~~ SOLN
24.0000 [IU] | Freq: Every day | SUBCUTANEOUS | Status: DC
Start: 2016-09-02 — End: 2016-09-03
  Administered 2016-09-02: 24 [IU] via SUBCUTANEOUS
  Filled 2016-09-02 (×2): qty 0.24

## 2016-09-02 MED ORDER — INSULIN DEGLUDEC 100 UNIT/ML ~~LOC~~ SOPN: 24.0000 [IU] | PEN_INJECTOR | Freq: Every day | SUBCUTANEOUS | Status: DC

## 2016-09-02 MED ORDER — ADULT MULTIVITAMIN W/MINERALS CH
1.0000 | ORAL_TABLET | Freq: Every day | ORAL | Status: DC
  Administered 2016-09-03 – 2016-09-09 (×7): 1 via ORAL
  Filled 2016-09-02 (×7): qty 1

## 2016-09-02 MED ORDER — DICYCLOMINE HCL 20 MG PO TABS
20.0000 mg | ORAL_TABLET | Freq: Three times a day (TID) | ORAL | Status: DC
  Administered 2016-09-02 – 2016-09-09 (×21): 20 mg via ORAL
  Filled 2016-09-02 (×25): qty 1

## 2016-09-02 MED ORDER — HYDRALAZINE HCL 25 MG PO TABS
25.0000 mg | ORAL_TABLET | Freq: Three times a day (TID) | ORAL | Status: DC
  Administered 2016-09-02 – 2016-09-09 (×21): 25 mg via ORAL
  Filled 2016-09-02 (×21): qty 1

## 2016-09-02 MED ORDER — SENNOSIDES-DOCUSATE SODIUM 8.6-50 MG PO TABS: 2.0000 | ORAL_TABLET | Freq: Every evening | ORAL | Status: DC | PRN

## 2016-09-02 MED ORDER — ONDANSETRON 4 MG PO TBDP
4.0000 mg | ORAL_TABLET | Freq: Once | ORAL | Status: AC
  Administered 2016-09-02: 4 mg via ORAL
  Filled 2016-09-02: qty 1

## 2016-09-02 MED ORDER — DULOXETINE HCL 60 MG PO CPEP
60.0000 mg | ORAL_CAPSULE | Freq: Every day | ORAL | Status: DC
  Administered 2016-09-02 – 2016-09-08 (×7): 60 mg via ORAL
  Filled 2016-09-02 (×7): qty 1

## 2016-09-02 MED ORDER — ALBUTEROL SULFATE (2.5 MG/3ML) 0.083% IN NEBU: 2.5000 mg | INHALATION_SOLUTION | RESPIRATORY_TRACT | Status: DC | PRN

## 2016-09-02 MED ORDER — FERROUS SULFATE 325 (65 FE) MG PO TABS
325.0000 mg | ORAL_TABLET | Freq: Every day | ORAL | Status: DC
  Administered 2016-09-03: 325 mg via ORAL
  Filled 2016-09-02: qty 1

## 2016-09-02 MED ORDER — LEVOTHYROXINE SODIUM 75 MCG PO TABS
75.0000 ug | ORAL_TABLET | Freq: Every day | ORAL | Status: DC
  Administered 2016-09-03 – 2016-09-09 (×7): 75 ug via ORAL
  Filled 2016-09-02 (×8): qty 1

## 2016-09-02 MED ORDER — INSULIN ASPART 100 UNIT/ML ~~LOC~~ SOLN
0.0000 [IU] | Freq: Every day | SUBCUTANEOUS | Status: DC
  Administered 2016-09-05 – 2016-09-06 (×2): 3 [IU] via SUBCUTANEOUS
  Administered 2016-09-07: 2 [IU] via SUBCUTANEOUS

## 2016-09-02 MED ORDER — ACETAMINOPHEN 325 MG PO TABS: 650.0000 mg | ORAL_TABLET | ORAL | Status: DC | PRN

## 2016-09-02 MED ORDER — OXYCODONE-ACETAMINOPHEN 5-325 MG PO TABS: 1.0000 | ORAL_TABLET | Freq: Four times a day (QID) | ORAL | 0 refills | Status: DC | PRN

## 2016-09-02 MED ORDER — HYDROCODONE-ACETAMINOPHEN 5-325 MG PO TABS
1.0000 | ORAL_TABLET | ORAL | Status: DC | PRN
  Administered 2016-09-03 – 2016-09-09 (×9): 1 via ORAL
  Filled 2016-09-02 (×9): qty 1

## 2016-09-02 MED ORDER — ENOXAPARIN SODIUM 80 MG/0.8ML ~~LOC~~ SOLN
80.0000 mg | SUBCUTANEOUS | Status: DC
  Administered 2016-09-02 – 2016-09-07 (×6): 80 mg via SUBCUTANEOUS
  Filled 2016-09-02 (×6): qty 0.8

## 2016-09-02 MED ORDER — INSULIN ASPART 100 UNIT/ML ~~LOC~~ SOLN
0.0000 [IU] | Freq: Three times a day (TID) | SUBCUTANEOUS | Status: DC
  Administered 2016-09-02 – 2016-09-03 (×2): 3 [IU] via SUBCUTANEOUS
  Administered 2016-09-04: 5 [IU] via SUBCUTANEOUS
  Administered 2016-09-04: 1 [IU] via SUBCUTANEOUS
  Administered 2016-09-05 (×3): 2 [IU] via SUBCUTANEOUS
  Administered 2016-09-06: 5 [IU] via SUBCUTANEOUS
  Administered 2016-09-06: 3 [IU] via SUBCUTANEOUS
  Administered 2016-09-06: 1 [IU] via SUBCUTANEOUS
  Administered 2016-09-07 (×2): 2 [IU] via SUBCUTANEOUS
  Administered 2016-09-07: 3 [IU] via SUBCUTANEOUS
  Administered 2016-09-08: 2 [IU] via SUBCUTANEOUS
  Administered 2016-09-08: 3 [IU] via SUBCUTANEOUS
  Administered 2016-09-08 – 2016-09-09 (×2): 2 [IU] via SUBCUTANEOUS
  Administered 2016-09-09: 3 [IU] via SUBCUTANEOUS

## 2016-09-02 MED ORDER — OXYCODONE-ACETAMINOPHEN 5-325 MG PO TABS
2.0000 | ORAL_TABLET | Freq: Once | ORAL | Status: AC
  Administered 2016-09-02: 2 via ORAL
  Filled 2016-09-02: qty 2

## 2016-09-02 MED ORDER — IPRATROPIUM-ALBUTEROL 0.5-2.5 (3) MG/3ML IN SOLN: 3.0000 mL | Freq: Two times a day (BID) | RESPIRATORY_TRACT | Status: DC | PRN

## 2016-09-02 MED ORDER — WARFARIN SODIUM 5 MG PO TABS
5.0000 mg | ORAL_TABLET | Freq: Once | ORAL | Status: AC
  Administered 2016-09-02: 5 mg via ORAL
  Filled 2016-09-02: qty 1

## 2016-09-02 MED ORDER — CARVEDILOL 12.5 MG PO TABS
12.5000 mg | ORAL_TABLET | Freq: Two times a day (BID) | ORAL | Status: DC
  Administered 2016-09-02 – 2016-09-09 (×14): 12.5 mg via ORAL
  Filled 2016-09-02 (×14): qty 1

## 2016-09-02 MED ORDER — TIZANIDINE HCL 2 MG PO TABS
2.0000 mg | ORAL_TABLET | Freq: Every day | ORAL | Status: DC
  Administered 2016-09-03 – 2016-09-09 (×7): 2 mg via ORAL
  Filled 2016-09-02 (×8): qty 1

## 2016-09-02 MED ORDER — SIMVASTATIN 40 MG PO TABS
40.0000 mg | ORAL_TABLET | Freq: Every evening | ORAL | Status: DC
  Administered 2016-09-02 – 2016-09-08 (×7): 40 mg via ORAL
  Filled 2016-09-02 (×7): qty 1

## 2016-09-02 MED ORDER — GABAPENTIN 300 MG PO CAPS
300.0000 mg | ORAL_CAPSULE | Freq: Every day | ORAL | Status: DC
  Administered 2016-09-02 – 2016-09-08 (×7): 300 mg via ORAL
  Filled 2016-09-02 (×7): qty 1

## 2016-09-02 MED ORDER — ASPIRIN EC 81 MG PO TBEC
81.0000 mg | DELAYED_RELEASE_TABLET | Freq: Every day | ORAL | Status: DC
  Administered 2016-09-02 – 2016-09-09 (×8): 81 mg via ORAL
  Filled 2016-09-02 (×8): qty 1

## 2016-09-02 MED ORDER — TORSEMIDE 20 MG PO TABS
40.0000 mg | ORAL_TABLET | Freq: Every day | ORAL | Status: DC
  Administered 2016-09-02 – 2016-09-04 (×3): 40 mg via ORAL
  Filled 2016-09-02 (×3): qty 2

## 2016-09-02 NOTE — Progress Notes (Signed)
ANTICOAGULATION CONSULT NOTE - Initial Consult  Pharmacy Consult for warfarin Indication: DVT  Allergies  Allergen Reactions  . Aldactone [Spironolactone] Other (See Comments)    Severe hyperkalemia   . Lisinopril Other (See Comments) and Cough    Hypotension also  . Crestor [Rosuvastatin Calcium] Other (See Comments)    Muscle Pain  . Vicodin [Hydrocodone-Acetaminophen] Nausea And Vomiting    Patient Measurements: Height: 5\' 2"  (157.5 cm) Weight: 164 lb 0.4 oz (74.4 kg) IBW/kg (Calculated) : 50.1  Vital Signs: Temp: 97.8 F (36.6 C) (11/10 0405) BP: 114/67 (11/10 1130) Pulse Rate: 80 (11/10 1130)  Labs:  Recent Labs  09/02/16 0338 09/02/16 0346 09/02/16 1139  HGB  --  10.5* 10.3*  HCT  --  31.0* 33.1*  PLT  --   --  184  CREATININE  --  1.50* 1.37*  CKTOTAL 162  --   --     Estimated Creatinine Clearance: 36.1 mL/min (by C-G formula based on SCr of 1.37 mg/dL (H)).  Assessment: [age] yof presented to the ED with leg pain and found to have an acute DVT. To start warfarin with lovenox bridge. Baseline H/H is slightly low and platelets are WNL. No INR available at this time but INR has been normal in the past.   Goal of Therapy:  INR 2-3 Monitor platelets by anticoagulation protocol: Yes   Plan:  - Check baseline INR - Warfarin 5mg  PO x 1 today - Daily INR - Monitor renal fxn for lovenox dosing, consider heparin gtt if Scr worsens  Anyra Kaufman, Drake Leachachel Lynn 09/02/2016,12:44 PM

## 2016-09-02 NOTE — H&P (Signed)
History and Physical    [name]:[id number] DOB: 02-Dec-1945 DOA: 09/02/2016   PCP: Cala Bradford, MD   Patient coming from/Resides with: Private residence  Admission status: Inpatient/med surg floor -medically necessary to stay a minimum 2 midnights to rule out impending and/or unexpected changes in physiologic status that may differ from initial evaluation performed in the ER and/or at time of admission. Patient presents with pain and right calf secondary to acute DVT in right posterior tibial and peroneal veins.given history of recent GI bleeding requiring transfusion of 2 units packed red blood cells September 2017 it was opted to begin full dose anticoagulation in the hospital setting to monitor for acute GI bleeding symptoms. Because patient has GFR in the stage IV chronic kidney disease range she isn't eligible for NOACs. Pharmacy has been consulted to manage the warfarin. Patient also has unexplained intermittent but asymptomatic worsening of her chronic hypoxemia which could represent an acute pulmonary embolism so VQ scan has been ordered at admission.  Chief Complaint: right calf pain  HPI: [name] is a 70 y.o. female with medical history significant for chronic tracheostomy for tracheal stenosis in setting of O2 dependent severe COPD, diabetes, chronic combined diastolic and systolic heart failure with an EF of 30-35% , hypertension, chronic anemia, history of prior CVA, dyslipidemia, history of prolonged QT on EKG current QTC within normal limits. She was last hospitalized in early September 2017 with lower GI bleeding presumably related to diverticular etiology. She required 2 units PRBCs during that admission. Symptoms were felt related to her antiplatelet agents of Plavix was held and scheduled to be resumed on 9/12. At follow-up cardiology visit on 10/11 patient's cardiologist opted to stop the Plavix given her history of GI bleeding but continued her aspirin. From  a cardiology standpoint patient has not had any chest pain or shortness of breath. Patient and family report that at some point in September while attempting to board a [employer] bus there was an issue with the chemical step not being completely deployed and while the patient was attempting to step on this device the driver apparently was able to deploy the step therefore it dropped rapidly and the patient fell injuring her left leg. Since that time she has had significant pain in the groin and hip region and has been undergoing home PT and has been taking Norco without any improvement of her symptoms. She was referred to neurosurgeon Dr. Farris Has. An MRI of the left hip was obtained on 11/4. She has not yet followed up with Dr. Farris Has to receive this report but I was able to pull up the MRI and discuss the results with the patient and family. The MRI revealed moderate to advanced bilateral hip arthritic changes with joint space narrowing and osteophytic spurring as well as muscle tears or strains involving the abductor muscles and the proximal quadriceps musculature of the left leg. For the past several days the patient has had pain in her right calf and presented to the emergency department for further evaluation. Lower extremity venous duplex has revealed an acute DVT involving the right posterior tibial and peroneal veins. EDP did discuss with vascular services and given patient's recent immobility and deconditioning full anti-coagulation was recommended. Initial plan was to have the patient go home on warfarin with Lovenox bridging but after further chart investigation it was discovered the patient had a lower GI bleeding admission with requirement for transfusion back in September and therefore it was best felt to initiate  anticoagulation in the hospital setting.  ED Course:  Vital Signs: BP 152/80 (BP Location: Left Arm)   Pulse 86   Temp 97.8 F (36.6 C)   Resp 21   Ht 5\' 2"  (1.575 m)   Wt 74.4 kg (164 lb  0.4 oz)   SpO2 (!) 89%   BMI 30.00 kg/m  DG right ankle, tibia-fibula and foot: No acute osseous injuries to the ankle, incidental finding of osteoarthritis of the knee with a small joint effusion, subacute to chronic appearing with proximal phalangeal fracture PCXR: Mild perihilar infiltration suggesting mild edema Lab data: Sodium 143, potassium 4.3, CO2 28, BUN 37, creatinine 1.37, glucose 128, anion gap 7, CK 162, white count 7700 with normal differential, hemoglobin 10.3, platelets 184,000 Medications and treatments: Percocet 5-3 25 2  tablets 1, Zofran 4 mg 1, DuoNeb 2, Lovenox 80 mg subcutaneous 1, Coumadin 5 mg 1  Review of Systems:  In addition to the HPI above,  No Fever-chills, myalgias or other constitutional symptoms No Headache, changes with Vision or hearing, new weakness, tingling, numbness in any extremity, dizziness, dysarthria or word finding difficulty, gait disturbance or imbalance, tremors or seizure activity No problems swallowing food or Liquids, indigestion/reflux, choking or coughing while eating, abdominal pain with or after eating No Chest pain, Cough or Shortness of Breath, palpitations, orthopnea or DOE No Abdominal pain, N/V, melena,hematochezia, dark tarry stools, constipation No dysuria, malodorous urine, hematuria or flank pain No new skin rashes, lesions, masses or bruises, No new redness of extremity or articular joint No recent unintentional weight gain or loss No polyuria, polydypsia or polyphagia   Past Medical History:  Diagnosis Date  . Anemia   . Arthritis    "hands, back" (06/28/2016)  . BRBPR (bright red blood per rectum) 06/28/2016  . CAD (coronary artery disease)    a. S/p CABG and stenting // b. Myoview 3/16: high risk EF 25%, ant-apical AK, ant scar with peri-infarct ischemia // c. LHC 3/16 - EF 45%, inferior hypokinesis, oLAD 100, oLCx 40, branch vessel 50, pRCA 100 dRCA , RV br 90, S-RCA occluded, S-LAD patent with inf-apical LAD  beyond graft 70 >> med Rx  . Carotid artery disease (HCC)    a. s/p L CEA // b. Carotid US 4/16: R 40-59%; L CEA patent with 40-59%  . Chronic combined systolic and diastolic CHF (congestive heart failure) (HCC)    a. 11/2011 Echo with EF 30-35%, global hypokinesis, and inferior akinesis;  b. 12/2014 Echo: EF 50%, mod LVH, Ao sclerosis w/o stenosis, mildly dil LA, mild to mod RV dysfxn. // c. Echo 10/16: Moderate LVH, EF 35-40%, inferior AK, grade 1 diastolic dysfunction, MAC, mild MR, moderate LAE  . Chronic lower back pain   . CKD (chronic kidney disease), stage III   . COPD (chronic obstructive pulmonary disease) (HCC)    a. prn and HS supplemental O2.  . DDD (degenerative disc disease), lumbar   . Depression   . GERD (gastroesophageal reflux disease)   . History of blood transfusion ~ 2015; 06/28/2016   "anemia"  . History of IBS   . History of tracheostomy 08/26/2013   Dr. Jenne PaneBates  . Hyperlipidemia   . Hypertension   . Hypothyroidism    Goiter  . MI (myocardial infarction) 1997  . On home oxygen therapy    "4L at night & prn during the day" (06/28/2016)  . PAD (peripheral artery disease) (HCC)   . Stroke [facility](HCC)    MRI 11/2011 with remote occipital  lobe. MRA with moderate left focal vertebral artery stenosis  . Type II diabetes mellitus (HCC)     Past Surgical History:  Procedure Laterality Date  . ABDOMINAL AORTAGRAM N/A 04/05/2013   Procedure: ABDOMINAL Ronny Flurry;  Surgeon: Sherren Kerns, MD;  Location: [facility] CATH LAB;  Service: Cardiovascular;  Laterality: N/A;  . [id number]  [id number]   Aortogram by Dr. [place] [facility])  . CAROTID ENDARTERECTOMY Left ~2008  . CARPAL TUNNEL RELEASE Left   . CATARACT EXTRACTION W/ INTRAOCULAR LENS IMPLANT & ANTERIOR VITRECTOMY, BILATERAL  ~2007-02/2016   "left-right"  . CHOLECYSTECTOMY OPEN    . COLONOSCOPY N/A 10/27/2014   Procedure: COLONOSCOPY;  Surgeon: Willis Modena, MD;  Location: [facility] ENDOSCOPY;  Service: Endoscopy;  Laterality:  N/A;  . CORONARY ARTERY BYPASS GRAFT     2 vessel  . DILATION AND CURETTAGE OF UTERUS    . FEMORAL-TIBIAL BYPASS GRAFT Left 06/11/2013   Procedure: BYPASS GRAFT  LEFT FEMORAL- POSTERIOR TIBIAL ARTERY/ REDO;  Surgeon: Sherren Kerns, MD;  Location: [facility] OR;  Service: Vascular;  Laterality: Left;  . LEFT AND RIGHT HEART CATHETERIZATION WITH CORONARY ANGIOGRAM N/A 01/09/2015   Procedure: LEFT AND RIGHT HEART CATHETERIZATION WITH CORONARY ANGIOGRAM;  Surgeon: Marykay Lex, MD;  Location: [facility] CATH LAB;  Service: Cardiovascular;  Laterality: N/A;  . PR VEIN BYPASS GRAFT,AORTO-FEM-POP     Right common femoral-AK popliteal BPG & Right Popliteal-posterior tibial  . PR VEIN BYPASS GRAFT,AORTO-FEM-POP     Left Fem-pop BPG  . PTCA    . SPINE SURGERY  Oct. 27, 2014   Injection - Back  . THROMBECTOMY FEMORAL ARTERY Left 06/11/2013   Procedure: THROMBECTOMY FEMORAL ARTERY;  Surgeon: Sherren Kerns, MD;  Location[facility] OR;  Service: Vascular;  Laterality: Left;  . THYROIDECTOMY    . TRACHEOSTOMY TUBE PLACEMENT  01/02/2012  . TUBAL LIGATION      Social History   Social History  . Marital status: Single    Spouse name: N/A  . Number of children: 3  . Years of education: N/A   Occupational History  . Retired    Social History Main Topics  . Smoking status: Former Smoker    Packs/day: 1.00    Years: 50.00    Types: Cigarettes    Quit date: 01/24/2012  . Smokeless tobacco: Never Used  . Alcohol use No  . Drug use: No  . Sexual activity: No   Other Topics Concern  . Not on file   Social History Narrative   Retired Advertising account executive.  Worked at the [employer] on [employer].  Formerly lived in [place] area.  2 daughters, Living here with her daughter in [place] starting end of January 2013. [name] [phone] [id number]. Ambulatory previously with a cane, but none now.    Mobility: Prior to fall in September utilized a cane currently requiring a rolling walker Work history:  Retired   Allergies  Allergen Reactions  . Aldactone [Spironolactone] Other (See Comments)    Severe hyperkalemia   . Lisinopril Other (See Comments) and Cough    Hypotension also  . Crestor [Rosuvastatin Calcium] Other (See Comments)    Muscle Pain  . Vicodin [Hydrocodone-Acetaminophen] Nausea And Vomiting    Family History  Problem Relation Age of Onset  . Hyperlipidemia Mother   . Other Mother     AAA  . Alzheimer's disease Mother   . Heart disease Mother   . Irregular heart beat Mother   . Diabetes Daughter   .  Hypertension Daughter      Prior to Admission medications   Medication Sig Start Date End Date Taking? Authorizing Provider  acetaminophen (TYLENOL) 500 MG tablet Take 1,000 mg by mouth daily as needed for mild pain or headache.    Yes Historical Provider, MD  aspirin EC 81 MG tablet Take 81 mg by mouth daily.   Yes Historical Provider, MD  carvedilol (COREG) 12.5 MG tablet Take 1 tablet (12.5 mg total) by mouth 2 (two) times daily with a meal. 03/29/16  Yes [name], MD  dicyclomine (BENTYL) 20 MG tablet Take 20 mg by mouth 3 (three) times daily before meals.   Yes Historical Provider, MD  DULoxetine (CYMBALTA) 60 MG capsule Take 60 mg by mouth at bedtime.  08/06/14  Yes Historical Provider, MD  ferrous sulfate 325 (65 FE) MG tablet Take 325 mg by mouth daily with breakfast.   Yes Historical Provider, MD  gabapentin (NEURONTIN) 300 MG capsule Take 1 capsule (300 mg total) by mouth at bedtime. 03/29/16  Yes Richard C Tuchman, DPM  glucose ([facility] GLUCOSE) 4 GM chewable tablet Chew 1 tablet by mouth daily as needed for low blood sugar.    Yes Historical Provider, MD  hydrALAZINE (APRESOLINE) 25 MG tablet Take 1 tablet (25 mg total) by mouth 3 (three) times daily. 04/11/16  Yes Scott Moishe Spice, PA-C  HYDROcodone-acetaminophen (NORCO) 10-325 MG tablet Take 0.5-1 tablets by mouth every 6 (six) hours as needed for moderate pain.  05/12/16  Yes Historical Provider, MD  Insulin  Degludec (TRESIBA FLEXTOUCH) 100 UNIT/ML SOPN Inject 24 Units into the skin at bedtime.    Yes Historical Provider, MD  ipratropium-albuterol (DUONEB) 0.5-2.5 (3) MG/3ML SOLN Take 3 mLs by nebulization 2 (two) times daily as needed (for shortnes of breath).    Yes Historical Provider, MD  isosorbide mononitrate (IMDUR) 30 MG 24 hr tablet Take 15 mg by mouth daily.   Yes Historical Provider, MD  lansoprazole (PREVACID) 30 MG capsule Take 30 mg by mouth 2 (two) times daily. 06/08/16  Yes Historical Provider, MD  levothyroxine (SYNTHROID, LEVOTHROID) 75 MCG tablet Take 75 mcg by mouth daily before breakfast.  06/12/16  Yes Historical Provider, MD  Multiple Vitamins-Minerals (WOMENS 50+ MULTI VITAMIN/MIN) TABS Take 1 tablet by mouth daily.   Yes Historical Provider, MD  nitroGLYCERIN (NITROSTAT) 0.4 MG SL tablet Place 0.4 mg under the tongue every 5 (five) minutes as needed for chest pain (x 3 doses). For chest pain   Yes Historical Provider, MD  NOVOLOG FLEXPEN 100 UNIT/ML FlexPen 70 -199 TAKE 12 UNITS, 200-299 TAKE 14 UNITS, 300 OR HIGHER 16 UNITS SUBCUTANEOUS (12-16 units subcutaneously three (3) times a day) 10/01/15  Yes Historical Provider, MD  OXYGEN-HELIUM IN Inhale 4 L into the lungs at bedtime.    Yes Historical Provider, MD  polyethylene glycol (MIRALAX / GLYCOLAX) packet Take 17 g by mouth daily. Patient taking differently: Take 17 g by mouth daily as needed for mild constipation.  07/04/16  Yes [name], MD  Probiotic Product (PROBIOTIC PO) Take 1 tablet by mouth daily.    Yes Historical Provider, MD  senna-docusate (SENOKOT-S) 8.6-50 MG tablet Take 2 tablets by mouth at bedtime. Patient taking differently: Take 2 tablets by mouth at bedtime as needed for mild constipation.  07/04/16  Yes [name], MD  simvastatin (ZOCOR) 40 MG tablet Take 40 mg by mouth every evening.  11/28/11  Yes Danley Danker, MD  tiZANidine (ZANAFLEX) 2 MG tablet Take  2 mg by mouth daily. 06/23/16  Yes Historical Provider,  MD  torsemide (DEMADEX) 20 MG tablet Take 40 mg by mouth daily.    Yes Historical Provider, MD  oxyCODONE-acetaminophen (PERCOCET/ROXICET) 5-325 MG tablet Take 1-2 tablets by mouth every 6 (six) hours as needed. 09/02/16   Layla MawKristen N Ward, DO    Physical Exam: Vitals:   09/02/16 1000 09/02/16 1130 09/02/16 1207 09/02/16 1254  BP: 137/80 114/67  152/80  Pulse: 73 80  86  Resp: 13 19  21   Temp:      SpO2: 92% (!) 86% (!) 85% (!) 89%  Weight: 74.4 kg (164 lb 0.4 oz)     Height: 5\' 2"  (1.575 m)         Constitutional: NAD, calm, comfortable Eyes: PERRL, lids and conjunctivae normal ENMT: Mucous membranes are moist. Posterior pharynx clear of any exudate or lesions.Normal dentition.  Neck: normal, supple, no masses, no thyromegaly-Tracheostomy tube midline and patent Respiratory: clear to auscultation bilaterally, no wheezing, no crackles. Normal respiratory effort. No accessory muscle use.  Cardiovascular: Regular rate and rhythm, no murmurs / rubs / gallops. No extremity edema. 2+ pedal pulses. No carotid bruits.  Abdomen: no tenderness, no masses palpated. No hepatosplenomegaly. Bowel sounds positive.  Musculoskeletal: no clubbing / cyanosis. No joint deformity upper and lower extremities. Good ROM, no contractures. Normal muscle tone. Left calf tender to palpation. While on stretcher patient able to lift both legs without any complaints of / subjective reports of significant pain left hip and groin region with attempts to bear weight Skin: no rashes, lesions, ulcers. No induration Neurologic: CN 2-12 grossly intact. Sensation intact, DTR normal. Strength 5/5 x all 4 extremities.  Psychiatric: Normal judgment and insight. Alert and oriented x 3. Normal mood.    Labs on Admission: I have personally reviewed following labs and imaging studies  CBC:  Recent Labs Lab 09/02/16 0346 09/02/16 1139  WBC  --  7.7  NEUTROABS  --  5.6  HGB 10.5* 10.3*  HCT 31.0* 33.1*  MCV  --  96.2   PLT  --  184   Basic Metabolic Panel:  Recent Labs Lab 09/02/16 0346 09/02/16 1139  NA 145 143  K 4.0 4.3  CL 105 108  CO2  --  28  GLUCOSE 101* 128*  BUN 39* 37*  CREATININE 1.50* 1.37*  CALCIUM  --  8.6*   GFR: Estimated Creatinine Clearance: 36.1 mL/min (by C-G formula based on SCr of 1.37 mg/dL (H)). Liver Function Tests: No results for input(s): AST, ALT, ALKPHOS, BILITOT, PROT, ALBUMIN in the last 168 hours. No results for input(s): LIPASE, AMYLASE in the last 168 hours. No results for input(s): AMMONIA in the last 168 hours. Coagulation Profile: No results for input(s): INR, PROTIME in the last 168 hours. Cardiac Enzymes:  Recent Labs Lab 09/02/16 0338  CKTOTAL 162   BNP (last 3 results) No results for input(s): PROBNP in the last 8760 hours. HbA1C: No results for input(s): HGBA1C in the last 72 hours. CBG: No results for input(s): GLUCAP in the last 168 hours. Lipid Profile: No results for input(s): CHOL, HDL, LDLCALC, TRIG, CHOLHDL, LDLDIRECT in the last 72 hours. Thyroid Function Tests: No results for input(s): TSH, T4TOTAL, FREET4, T3FREE, THYROIDAB in the last 72 hours. Anemia Panel: No results for input(s): VITAMINB12, FOLATE, FERRITIN, TIBC, IRON, RETICCTPCT in the last 72 hours. Urine analysis:    Component Value Date/Time   COLORURINE YELLOW 07/03/2016 1853   APPEARANCEUR CLOUDY (A) 07/03/2016  1853   LABSPEC 1.015 07/03/2016 1853   PHURINE 5.5 07/03/2016 1853   GLUCOSEU NEGATIVE 07/03/2016 1853   HGBUR NEGATIVE 07/03/2016 1853   BILIRUBINUR NEGATIVE 07/03/2016 1853   KETONESUR NEGATIVE 07/03/2016 1853   PROTEINUR NEGATIVE 07/03/2016 1853   UROBILINOGEN 0.2 07/27/2015 1650   NITRITE NEGATIVE 07/03/2016 1853   LEUKOCYTESUR TRACE (A) 07/03/2016 1853   Sepsis Labs: @LABRCNTIP (procalcitonin:4,lacticidven:4) )No results found for this or any previous visit (from the past 240 hour(s)).   Radiological Exams on Admission: Dg Tibia/fibula  Right  Result Date: 09/02/2016 CLINICAL DATA:  Right leg pain for 2 days, no known injury EXAM: RIGHT TIBIA AND FIBULA - 2 VIEW COMPARISON:  None FINDINGS: Within the soft tissues of right calf and knee are vascular clips along the medial and posterior aspect. Popliteal and tibial arteriosclerosis is identified. There is osteoarthritic joint space narrowing with spurring of the femorotibial and patellofemoral compartment. Small joint effusion is noted. No intra-articular loose body in the knee. Ankle mortise is maintained. No acute fracture bone destruction. IMPRESSION: Osteoarthritis of the knee with small joint effusion. Arteriosclerosis along the course of the visualized popliteal and tibial arteries. Electronically Signed   By: Tollie Eth M.D.   On: 09/02/2016 04:14   Dg Ankle Complete Right  Result Date: 09/02/2016 CLINICAL DATA:  Right leg pain for 2 days without no known injury. EXAM: RIGHT ANKLE - COMPLETE 3+ VIEW COMPARISON:  None. FINDINGS: The ankle mortise is maintained. No acute displaced appearing fracture nor bone destruction is noted. The base of the fifth metatarsal is intact. Small calcaneal enthesophyte is seen along the plantar aspect with small ossification posterior to the calcaneus. No joint effusion. IMPRESSION: No acute osseous abnormality.  Tiny calcaneal enthesophytes. Electronically Signed   By: Tollie Eth M.D.   On: 09/02/2016 04:09   Dg Chest Portable 1 View  Result Date: 09/02/2016 CLINICAL DATA:  Hypoxia tonight.  Tracheostomy patient. EXAM: PORTABLE CHEST 1 VIEW COMPARISON:  07/03/2016 FINDINGS: Postoperative changes in the mediastinum. Tracheostomy. Tip measures 5 cm above the carina. Cardiac enlargement with mild perihilar infiltration suggesting edema. No blunting of costophrenic angles. No pneumothorax. Calcified and tortuous aorta. IMPRESSION: Cardiac enlargement with mild perihilar infiltration suggesting edema. Electronically Signed   By: Burman Nieves M.D.    On: 09/02/2016 05:44   Dg Foot Complete Right  Result Date: 09/02/2016 CLINICAL DATA:  Right leg pain for 2 days.  No known injury. EXAM: RIGHT FOOT COMPLETE - 3+ VIEW COMPARISON:  None. FINDINGS: There is slight joint space narrowing of the first MTP articulation. Proximal phalanx medially with sclerotic appearing margins suggesting a subacute to chronic injury. Calcaneal enthesophytes are identified along the plantar and dorsal aspect of the foot. No bone destruction is seen. IMPRESSION: Subacute to chronic appearing fifth proximal phalangeal fracture. No acute osseous abnormality. Degenerative joint space narrowing of the first MTP. Electronically Signed   By: Tollie Eth M.D.   On: 09/02/2016 04:11    EKG: (Independently reviewed) sinus rhythm with first-degree AV block, ventricular rate 82 bpm, QTC 468 ms, chronic downsloping ST segments in inferior lateral leads consistent with LV strain, borderline voltage criteria for LVH  Assessment/Plan Principal Problem:   Acute deep vein thrombosis (DVT)  -Presented with right calf pain with duplex imaging consistent with acute DVT involving right posterior tibial and peroneal veins -EDP discussed with vascular services with recommendation for full dose anticoagulation -Given low GFR/chronic kidney disease not candidate for NOAC -Begin full dose Lovenox with warfarin with  pharmacy managing dosing  Active Problems:   Acute on chronic respiratory failure with hypoxia/COPD, severe/Subglottic stenosis w/chronic tracheostomy  -Patient on chronic 4 L bleed in to trach collar at home but currently requiring a bleed in of 6 L -Since arrival to the ER patient's O2 saturations have been fluctuating between 75% and 93% and patient is not symptomatic -Not wheezing and no evidence of COPD exacerbation and clinical exam -Given acute DVT for completeness of exam plan to obtain obtain VQ scan (unable to obtain CT angiogram of the chest given severity of chronic  kidney disease) but because of trach unable to perform ventilation images; discuss with attending and plan noncontrasted CT of the chest to see if would be beneficial    CKD (chronic kidney disease) stage 4, GFR 15-29 ml/min  -Current renal function stable and at baseline of 37/1.37    History of lower GI bleeding-presumed diverticular -Required 2 units PRBCS during hospitalization in early September -Plavix was discontinued in favor of aspirin alone in October -No further GI bleeding symptoms since previous admission -Monitor closely with initiation of anticoagulation for acute DVT    Strain and tear of left hip adductor muscle and quadricep muscle -Occurred nearly 2 months ago after mechanical fall -Patient with persistent pain primarily with weightbearing and ambulation -Had not been making progress in the outpatient setting (but did not have above known diagnosis confirmed) therefore physical therapy was discontinued -Attempt to improve pain control: I started Ultram with Tylenol for daytime use with recommendation to use narcotics at bedtime to minimize daytime sleepiness during in the home setting with Norco -PT and OT evaluation -?? If would qualify for rehabilitative therapies -Given CKD unable to utilize NSAIDs -X-rays also reveal osteoarthritis of the right knee with a small effusion as well as a subacute versus chronic toe fracture on the right foot    CAD (coronary artery disease) -Currently asymptomatic -Plavix discontinued on 10/11 by cardiologist due to recent GI bleeding symptoms -Continue baby aspirin -Continue carvedilol, Zocor and Imdur    Chronic combined systolic and diastolic CHF, NYHA class 2 (EF 30-35%) per ECHO 2017 -Does not appear to be acutely decompensated although there is a question of some edema on chest x-ray -Continue home carvedilol, hydralazine and Demadex -Daily weights with strict intake and output -Current weight stable at 164 pounds and has  increased from a nadir of around 154 pounds in late August -BNP has been ordered by EDP and was pending at time of admission    Diabetes mellitus type 2, uncontrolled  -Mild hyperglycemia at presentation -Hemoglobin A1c was 8.7 in September-repeat this admission -Continue home Tresiba -Follow CBGs and provide SSI    Hypertension -Current blood pressure controlled -Continue new medications as listed above under CAD/CHF    Hyperlipidemia -Continue statin    Hypothyroidism status post thyroidectomy -Continue Synthroid -TSH was 4.143 and September    History of CVA (cerebrovascular accident) -Continue statin and baby aspirin as above    Anemia -Current hemoglobin stable and at baseline of 10.3    Nonspecific abnormal electrocardiogram (ECG) (EKG)/QT prolongation -Current QTc within normal limits      DVT prophylaxis: Lovenox bridge with warfarin anticoagulation initiated this admission  Code Status: Full Family Communication: Daughter at bedside Disposition Plan: Anticipate discharge back to preadmission home environment pending PT/OT evaluation for adductor and quadriceps muscle tears and strains of left leg Consults called: EDP discussed DVT/anticoagulation with on-call vascular physician    Russella Dar ANP-BC [facility] Pager [phone]  If 7PM-7AM, please contact night-coverage [email or web address] Password [email or web address]  09/02/2016, 12:58 PM

## 2016-09-02 NOTE — ED Notes (Signed)
Patients SpO2 has been fluctuating between 75% and 93%. Patient not reporting any new symptoms. EDP notified and at bedside. O2 increased to 6L via [employer].

## 2016-09-02 NOTE — Progress Notes (Signed)
*  Preliminary Results* Right lower extremity venous duplex completed. Right lower extremity is positive for acute deep vein thrombosis involving the right posterior tibial and peroneal veins. There is no evidence of right Baker's cyst.  Preliminary results discussed with Dr. Clarene DukeLittle.  09/02/2016 8:11 AM  Gertie FeyMichelle Sam Overbeck, BS, RVT, RDCS, RDMS

## 2016-09-02 NOTE — Progress Notes (Addendum)
Patient arrived form the ED assessment completed see flowsheet placed on tele ccmd notified, patient oriented to room and staff, trach care equipment at bedside, bed in lowest position , call bell within reach will continue to monitor

## 2016-09-02 NOTE — ED Triage Notes (Signed)
Pt from home Brought in EMS PTAR pain from the Right knee down that started yesterday morning but has progressively gotten worse.

## 2016-09-02 NOTE — ED Notes (Signed)
Pt. Pulse oximetry changed at this time. 86% reading probably due to acrylic nails. Pt. Oxygen saturation read 100% at times.

## 2016-09-02 NOTE — ED Provider Notes (Signed)
By signing my name below, I, [name], attest that this documentation has been prepared under the direction and in the presence of Becky Wolven N Tonie Elsey, DO.  Electronically Signed: [name], ED Scribe. 09/02/16. 3:16 AM.   TIME SEEN: 3:16 AM   CHIEF COMPLAINT:  Chief Complaint  Patient presents with  . Leg Pain    HPI: HPI Comments: [name] brought in by EMS is a 70 y.o. female with a PMHx of CAD, arthritis, CHF, COPD, CKD, and HTN who presents to the Emergency Department complaining of constant, unchanged R leg pain onset yesterday morning. No recent injury or trauma. Pt states discomfort to leg is made worse with light palpation and with certain position changes. No alleviating factors at this time. No OTC medications or home remedies attempted prior to arrival.  No recent fever, chills, nausea, vomiting, chest pain, or shortness of breath. No prior history of blood clots.  PCP: [name],Becky S, [name]    ROS: See HPI Constitutional: no fever  Eyes: no drainage  ENT: no runny nose   Cardiovascular:  no chest pain  Resp: no SOB  GI: no vomiting GU: no dysuria Integumentary: no rash  Allergy: no hives  Musculoskeletal: no leg swelling. Positive arthralgias  Neurological: no slurred speech ROS otherwise negative  PAST MEDICAL HISTORY/PAST SURGICAL HISTORY:  Past Medical History:  Diagnosis Date  . Anemia   . Arthritis    "hands, back" (06/28/2016)  . BRBPR (bright red blood per rectum) 06/28/2016  . CAD (coronary artery disease)    a. S/p CABG and stenting // b. Myoview 3/16: high risk EF 25%, ant-apical AK, ant scar with peri-infarct ischemia // c. LHC 3/16 - EF 45%, inferior hypokinesis, oLAD 100, oLCx 40, branch vessel 50, pRCA 100 dRCA , RV br 90, S-RCA occluded, S-LAD patent with inf-apical LAD beyond graft 70 >> med Rx  . Carotid artery disease (HCC)    a. s/p L CEA // b. Carotid US 4/16: R 40-59%; L CEA patent with 40-59%  . Chronic combined systolic and  diastolic CHF (congestive heart failure) (HCC)    a. 11/2011 Echo with EF 30-35%, global hypokinesis, and inferior akinesis;  b. 12/2014 Echo: EF 50%, mod LVH, Ao sclerosis w/o stenosis, mildly dil LA, mild to mod RV dysfxn. // c. Echo 10/16: Moderate LVH, EF 35-40%, inferior AK, grade 1 diastolic dysfunction, MAC, mild MR, moderate LAE  . Chronic lower back pain   . CKD (chronic kidney disease), stage III   . COPD (chronic obstructive pulmonary disease) (HCC)    a. prn and HS supplemental O2.  . DDD (degenerative disc disease), lumbar   . Depression   . GERD (gastroesophageal reflux disease)   . History of blood transfusion ~ 2015; 06/28/2016   "anemia"  . History of IBS   . History of tracheostomy 08/26/2013   Dr. Jenne [name]  . Hyperlipidemia   . Hypertension   . Hypothyroidism    Goiter  . MI (myocardial infarction) 1997  . On home oxygen therapy    "4L at night & prn during the day" (06/28/2016)  . PAD (peripheral artery disease) (HCC)   . Stroke [facility](HCC)    MRI 11/2011 with remote occipital lobe. MRA with moderate left focal vertebral artery stenosis  . Type II diabetes mellitus (HCC)     MEDICATIONS:  Prior to Admission medications   Medication Sig Start Date End Date Taking? Authorizing Provider  acetaminophen (TYLENOL) 500 MG tablet Take 1,000 mg  by mouth daily as needed for mild pain or headache.     Historical Provider, [name]  aspirin EC 81 MG tablet Take 81 mg by mouth daily.    Historical Provider, [name]  carvedilol (COREG) 12.5 MG tablet Take 1 tablet (12.5 mg total) by mouth 2 (two) times daily with a meal. 03/29/16   Becky Bathe, [name]  dicyclomine (BENTYL) 20 MG tablet Take 20 mg by mouth 3 (three) times daily before meals.    Historical Provider, [name]  DULoxetine (CYMBALTA) 60 MG capsule Take 60 mg by mouth at bedtime.  08/06/14   Historical Provider, [name]  ferrous sulfate 325 (65 FE) MG tablet Take 325 mg by mouth daily with breakfast.    Historical Provider, [name]  gabapentin (NEURONTIN) 300  MG capsule Take 1 capsule (300 mg total) by mouth at bedtime. 03/29/16   [name], DPM  glucose ([employer] GLUCOSE) 4 GM chewable tablet Chew 1 tablet by mouth as needed for low blood sugar.    Historical Provider, [name]  hydrALAZINE (APRESOLINE) 25 MG tablet Take 1 tablet (25 mg total) by mouth 3 (three) times daily. 04/11/16   Becky Lecher, PA-C  HYDROcodone-acetaminophen (NORCO) 10-325 MG tablet Take 0.5-1 tablets by mouth every 6 (six) hours as needed for moderate pain.  05/12/16   Historical Provider, [name]  Insulin Degludec (TRESIBA FLEXTOUCH) 100 UNIT/ML SOPN Inject 22 Units into the skin at bedtime.     Historical Provider, [name]  ipratropium-albuterol (DUONEB) 0.5-2.5 (3) MG/3ML SOLN Take 3 mLs by nebulization 2 (two) times daily as needed (for shortnes of breath).     Historical Provider, [name]  isosorbide mononitrate (IMDUR) 30 MG 24 hr tablet Take 15 mg by mouth daily.    Historical Provider, [name]  lansoprazole (PREVACID) 30 MG capsule Take 30 mg by mouth 2 (two) times daily. 06/08/16   Historical Provider, [name]  levothyroxine (SYNTHROID, LEVOTHROID) 75 MCG tablet Take 75 mcg by mouth daily before breakfast.  06/12/16   Historical Provider, [name]  Multiple Vitamins-Minerals (WOMENS 50+ MULTI VITAMIN/MIN) TABS Take 1 tablet by mouth daily.    Historical Provider, [name]  nitroGLYCERIN (NITROSTAT) 0.4 MG SL tablet Place 0.4 mg under the tongue every 5 (five) minutes as needed for chest pain (x 3 doses). For chest pain    Historical Provider, [name]  NOVOLOG FLEXPEN 100 UNIT/ML FlexPen 70 -199 TAKE 12 UNITS, 200-299 TAKE 14 UNITS, 300 OR HIGHER 16 UNITS SUBCUTANEOUS (12-16 units subcutaneously three (3) times a day) 10/01/15   Historical Provider, [name]  OXYGEN-HELIUM IN Inhale 4 L into the lungs at bedtime.     Historical Provider, [name]  polyethylene glycol (MIRALAX / GLYCOLAX) packet Take 17 g by mouth daily. 07/04/16   Becky Grates, [name]  Probiotic Product (PROBIOTIC PO) Take 1 tablet by mouth daily.     Historical Provider, [name]   senna-docusate (SENOKOT-S) 8.6-50 MG tablet Take 2 tablets by mouth at bedtime. 07/04/16   Becky Grates, [name]  simvastatin (ZOCOR) 40 MG tablet Take 40 mg by mouth every evening.  11/28/11   Danley Danker, [name]  tiZANidine (ZANAFLEX) 2 MG tablet Take 2 mg by mouth daily. 06/23/16   Historical Provider, [name]  torsemide (DEMADEX) 20 MG tablet TAKE 4 TABLETS (80 MG TOTAL) BY MOUTH ONCE WEEKLY    Historical Provider, [name]    ALLERGIES:  Allergies  Allergen Reactions  . Aldactone [Spironolactone] Other (See Comments)    Severe hyperkalemia   . Lisinopril Other (See Comments) and Cough  Hypotension also  . Crestor [Rosuvastatin Calcium] Other (See Comments)    Muscle Pain  . Vicodin [Hydrocodone-Acetaminophen] Nausea And Vomiting    SOCIAL HISTORY:  Social History  Substance Use Topics  . Smoking status: Former Smoker    Packs/day: 1.00    Years: 50.00    Types: Cigarettes    Quit date: 01/24/2012  . Smokeless tobacco: Never Used  . Alcohol use No    FAMILY HISTORY: Family History  Problem Relation Age of Onset  . Hyperlipidemia Mother   . Other Mother     AAA  . Alzheimer's disease Mother   . Heart disease Mother   . Irregular heart beat Mother   . Diabetes Daughter   . Hypertension Daughter     EXAM: SpO2 99%  CONSTITUTIONAL: Alert and oriented and responds appropriately to questions. Well-appearing; well-nourished HEAD: Normocephalic EYES: Conjunctivae clear, PERRL, EOMI ENT: normal nose; no rhinorrhea; moist mucous membranes NECK: Supple, no meningismus, no nuchal rigidity, no LAD; trach in place with No surrounding erythema, warmth or secretions  CARD: RRR; S1 and S2 appreciated; no murmurs, no clicks, no rubs, no gallops RESP: Normal chest excursion without splinting or tachypnea; breath sounds clear and equal bilaterally; no wheezes, no rhonchi, no rales, no hypoxia or respiratory distress, speaking full sentences ABD/GI: Normal bowel sounds; non-distended; soft,  non-tender, no rebound, no guarding, no peritoneal signs, no hepatosplenomegaly BACK:  The back appears normal and is non-tender to palpation, there is no CVA tenderness EXT: Tender to palpation throughout the right lower extremity from the knee down without bony deformity, erythema or warmth. No joint effusion noted. Compartment are all soft. 2+ DP pulse on the right side. Extremities warm and well-perfused. Normal ROM in all joints; patient has chronic pain in her left knee but otherwise extremities are non-tender to palpation; no edema; normal capillary refill; no cyanosis, no calf tenderness or swelling    SKIN: Normal color for age and race; warm; no rash NEURO: Moves all extremities equally, sensation to light touch intact diffusely, cranial nerves II through XII intact, normal speech PSYCH: The patient's mood and manner are appropriate. Grooming and personal hygiene are appropriate.  MEDICAL DECISION MAKING: Patient here with right leg pain. No known injury. No sign of septic arthritis, cellulitis. Tender throughout her calf and she is less mobile than normal. Concern for possible DVT. Extremity is warm and well-perfused. Normal sensation and good strong pulses. We'll obtain x-rays of this leg and a venous Doppler in the AM. We'll give Percocet for pain.  Right hip and knee are unremarkable.  ED PROGRESS: 5:00 AM  Pt's labs show no acute abnormality. Creatinine is improving. X-rays are unremarkable other than subacute to chronic appearing fifth proximal phalangeal fracture which she is aware of. States she injured her fifth toe weeks ago and broke it. She is awaiting a venous Doppler for further evaluation. Pain is improved.    5:15 AM  Pt having episodes of hypoxia on the monitor - mid 80s to low 90s.  She is on a trach collar with 4 L which is what she wears at night. She denies any chest pain, shortness of breath, cough, fever.  Lungs are still clear to auscultation but may be slightly  diminished at her bases bilaterally. Good waveform. Tried on both fingers and forehead and ear with different pulse oximeters and they all read the same. We'll have respiratory therapy come section her trach, will obtain a chest x-ray and give DuoNeb treatment.  5:55 AM  Pt's ABG is reassuring. She states that sometimes she will have a low pulse ox at home as well. When she changes position her oxygen saturation increases significantly. She again denies any shortness of breath her lungs are clear. Chest x-ray shows possible mild edema but otherwise no infiltrate, pneumothorax or other acute abnormality. Tracheostomy is in place.  7:10 AM Pt's oxygen level has improved. She is awaiting a venous Doppler. Anticipate discharge home with outpatient follow-up. Patient comfortable with this plan.  Signed out to Dr. Clarene Duke to follow up on venous doppler.   I reviewed all nursing notes, vitals, pertinent old records, EKGs, labs, imaging (as available).   I personally performed the services described in this documentation, which was scribed in my presence. The recorded information has been reviewed and is accurate.    Layla Maw Bayne Fosnaugh, DO 09/02/16 [phone]

## 2016-09-02 NOTE — ED Notes (Signed)
EDP at bedside  

## 2016-09-02 NOTE — Progress Notes (Signed)
BNP > 600 and CT Chest suggestive of edema (L >R) - give 1x dose Laisx 80 mg IV- Demadex continued at time of admit  Junious SilkAllison Ellis, ANP

## 2016-09-02 NOTE — Care Management Note (Signed)
Case Management Note  Patient Details  Name: [name] MRN: [id number] Date of Birth: 10/02/1946  Subjective/Objective:                  70 y.o. female with a PMHx of CAD, arthritis, CHF, COPD, CKD, and HTN who presents to the Emergency Department complaining of constant, unchanged R leg pain onset yesterday morning. From home.  Action/Plan: Follow for disposition needs.   Expected Discharge Date:  09/02/16               Expected Discharge Plan:  Home w Home Health Services  In-House Referral:  NA  Discharge planning Services  CM Consult  Post Acute Care Choice:  Resumption of Svcs/PTA Provider Choice offered to:  Patient  DME Arranged:  N/A DME Agency:  NA  HH Arranged:  PT HH Agency:  [facility]  Status of Service:  Completed, signed off  If discussed at Long Length of Stay Meetings, dates discussed:    Additional Comments: Pt currently active with [facility] for RN services.  Resumption of care requested. Avie EchevariaKaren Nussbaum, RN [facility] notified.  No DME needs identified at this time.  EDCM advised EDP to order reinstatement of HHPT and date for INR check and who to call results to. [name], RN, BSN, [place] [phone]   [name], [name], RN 09/02/2016, 10:47 AM

## 2016-09-02 NOTE — ED Notes (Signed)
Pt. Transported to vascular ultrasound at this time.  

## 2016-09-02 NOTE — ED Notes (Signed)
Spoke with admitting NP about NM VentPerf scan. It is being d/c for CT scan.

## 2016-09-03 LAB — COMPREHENSIVE METABOLIC PANEL
ALT: 18 U/L (ref 14–54)
ANION GAP: 8 (ref 5–15)
AST: 19 U/L (ref 15–41)
Albumin: 3.1 g/dL — ABNORMAL LOW (ref 3.5–5.0)
Alkaline Phosphatase: 71 U/L (ref 38–126)
BUN: 33 mg/dL — ABNORMAL HIGH (ref 6–20)
CALCIUM: 8.5 mg/dL — AB (ref 8.9–10.3)
CHLORIDE: 107 mmol/L (ref 101–111)
CO2: 29 mmol/L (ref 22–32)
CREATININE: 1.4 mg/dL — AB (ref 0.44–1.00)
GFR, EST AFRICAN AMERICAN: 43 mL/min — AB (ref >60)
GFR, EST NON AFRICAN AMERICAN: 37 mL/min — AB (ref >60)
Glucose, Bld: 100 mg/dL — ABNORMAL HIGH (ref 65–99)
Potassium: 3.8 mmol/L (ref 3.5–5.1)
Sodium: 144 mmol/L (ref 135–145)
Total Bilirubin: 0.6 mg/dL (ref 0.3–1.2)
Total Protein: 5.8 g/dL — ABNORMAL LOW (ref 6.5–8.1)

## 2016-09-03 LAB — CBC
HCT: 30.6 % — ABNORMAL LOW (ref 36.0–46.0)
HEMOGLOBIN: 9.5 g/dL — AB (ref 12.0–15.0)
MCH: 29.7 pg (ref 26.0–34.0)
MCHC: 31 g/dL (ref 30.0–36.0)
MCV: 95.6 fL (ref 78.0–100.0)
PLATELETS: 180 10*3/uL (ref 150–400)
RBC: 3.2 MIL/uL — AB (ref 3.87–5.11)
RDW: 17.1 % — ABNORMAL HIGH (ref 11.5–15.5)
WBC: 7.7 10*3/uL (ref 4.0–10.5)

## 2016-09-03 LAB — GLUCOSE, CAPILLARY
GLUCOSE-CAPILLARY: 57 mg/dL — AB (ref 65–99)
GLUCOSE-CAPILLARY: 59 mg/dL — AB (ref 65–99)
GLUCOSE-CAPILLARY: 73 mg/dL (ref 65–99)
GLUCOSE-CAPILLARY: 107 mg/dL — AB (ref 65–99)
Glucose-Capillary: 146 mg/dL — ABNORMAL HIGH (ref 65–99)
Glucose-Capillary: 207 mg/dL — ABNORMAL HIGH (ref 65–99)

## 2016-09-03 LAB — PROTIME-INR
INR: 1.07
PROTHROMBIN TIME: 14 s (ref 11.4–15.2)

## 2016-09-03 LAB — HEMOGLOBIN A1C
Hgb A1c MFr Bld: 8.7 % — ABNORMAL HIGH (ref 4.8–5.6)
MEAN PLASMA GLUCOSE: 203 mg/dL

## 2016-09-03 MED ORDER — WARFARIN SODIUM 5 MG PO TABS
5.0000 mg | ORAL_TABLET | Freq: Once | ORAL | Status: AC
  Administered 2016-09-03: 5 mg via ORAL
  Filled 2016-09-03: qty 1

## 2016-09-03 MED ORDER — POLYETHYLENE GLYCOL 3350 17 G PO PACK
17.0000 g | PACK | Freq: Every day | ORAL | Status: DC
  Administered 2016-09-03 – 2016-09-06 (×4): 17 g via ORAL
  Filled 2016-09-03 (×5): qty 1

## 2016-09-03 MED ORDER — FERROUS SULFATE 325 (65 FE) MG PO TABS
325.0000 mg | ORAL_TABLET | Freq: Two times a day (BID) | ORAL | Status: DC
  Administered 2016-09-03 – 2016-09-09 (×12): 325 mg via ORAL
  Filled 2016-09-03 (×12): qty 1

## 2016-09-03 MED ORDER — PATIENT'S GUIDE TO USING COUMADIN BOOK
Freq: Once | Status: AC
  Administered 2016-09-03: 18:00:00
  Filled 2016-09-03: qty 1

## 2016-09-03 MED ORDER — INSULIN GLARGINE 100 UNIT/ML ~~LOC~~ SOLN
15.0000 [IU] | Freq: Every day | SUBCUTANEOUS | Status: DC
Start: 2016-09-03 — End: 2016-09-04
  Administered 2016-09-03: 15 [IU] via SUBCUTANEOUS
  Filled 2016-09-03: qty 0.15

## 2016-09-03 NOTE — Progress Notes (Signed)
PROGRESS NOTE    [name]  HYQ:[id number] DOB: 10/12/1946 DOA: 09/02/2016 PCP: Cala Bradford, MD   Brief Narrative: [name] is a 70 y.o. female with medical history significant for chronic tracheostomy for tracheal stenosis in setting of O2 dependent severe COPD, diabetes, chronic combined diastolic and systolic heart failure with an EF of 30-35% , hypertension, chronic anemia, history of prior CVA, dyslipidemia,. She was last hospitalized in early September 2017 with lower GI bleeding presumably related to diverticular etiology. She required 2 units PRBCs during that admission. Symptoms were felt related to her antiplatelet agents of Plavix was held and scheduled to be resumed on 9/12. At follow-up cardiology visit on 10/11 patient's cardiologist opted to stop the Plavix given her history of GI bleeding but continued her aspirin. From a cardiology standpoint patient has not had any chest pain or shortness of breath. Patient and family report that at some point in September while attempting to board a [employer] bus there was an issue with the chemical step not being completely deployed and while the patient was attempting to step on this device the driver apparently was able to deploy the step therefore it dropped rapidly and the patient fell injuring her left leg. Since that time she has had significant pain in the groin and hip region and has been undergoing home PT and has been taking Norco without any improvement of her symptoms. She was referred to neurosurgeon Dr. Farris Has. An MRI of the left hip was obtained on 11/4. She has not yet followed up with Dr. Farris Has to receive this report but I was able to pull up the MRI and discuss the results with the patient and family. 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 For the past several days the patient has had pain in her right calf and presented to the emergency department for further evaluation. Lower extremity venous duplex has revealed an acute DVT involving the right posterior tibial and peroneal veins. EDP did discuss with vascular services and given patient's recent immobility and deconditioning full anti-coagulation was recommended. Initial plan was to have the patient go home on warfarin with Lovenox bridging but after further chart investigation it was discovered the patient had a lower GI bleeding admission with requirement for transfusion back in September and therefore it was best felt to initiate anticoagulation in the hospital setting.   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   Subglottic stenosis w/chronic tracheostomy   Anemia   Nonspecific abnormal electrocardiogram (ECG) (EKG)/QT prolongation   Acute on chronic respiratory failure with hypoxia (HCC)   COPD, severe (HCC)   CKD (chronic kidney disease) stage 4, GFR 15-29 ml/min (HCC)   History of lower GI bleeding-presumed diverticular   Strain and tear of left hip adductor muscle and quadricep muscle   Acute deep vein thrombosis (DVT)  -Presented with right calf pain with duplex imaging consistent with acute DVT involving right posterior tibial and peroneal veins -EDP discussed with vascular  services with recommendation for full dose anticoagulation -Given low GFR/chronic kidney disease not candidate for NOAC -continue with Lovenox, coumadin.     Acute on chronic respiratory failure with hypoxia/COPD, severe/Subglottic  stenosis w/chronic tracheostomy  -Patient on chronic 4 L bleed in to trach collar at home but currently requiring a bleed in of 6 L. -CT chest showed pulmonary edema, received lasix. Hypoxemia improved. Also there was debrides at the tip of tracheostomy tube. Nurse has provided trach care.  -continue with torsemide.     CKD (chronic kidney disease) stage 4, GFR 15-29 ml/min  -Current renal function stable and at baseline of 37/1.37    History of lower GI bleeding-presumed diverticular -Required 2 units PRBCS during hospitalization in early September -Plavix was discontinued in favor of aspirin alone in October -No further GI bleeding symptoms since previous admission -Monitor closely with initiation of anticoagulation for acute DVT    Strain and tear of left hip adductor muscle and quadricep muscle -Occurred nearly 2 months ago after mechanical fall -Patient with persistent pain primarily with weightbearing and ambulation -PT and OT evaluation -will inform to Dr Farris Has that patient is in the hospital '    CAD (coronary artery disease) -Currently asymptomatic -Plavix discontinued on 10/11 by cardiologist due to recent GI bleeding symptoms -Continue baby aspirin -Continue carvedilol, Zocor and Imdur    Acute on Chronic combined systolic and diastolic CHF, NYHA class 2 (EF 30-35%) per ECHO 2017 -Continue home carvedilol, hydralazine and Demadex -Daily weights with strict intake and output -Current weight stable at 164 pounds and has increased from a nadir of around 154 pounds in late August -received 80 mg IV lasix.     Diabetes mellitus type 2, uncontrolled  -Mild hyperglycemia at presentation -hold Evaristo Bury, use lantus while in the hospital.  -decrease lantus dose due to hypoglycemia./     Hypertension    Hyperlipidemia -Continue statin    Hypothyroidism status post thyroidectomy -Continue Synthroid    History of CVA (cerebrovascular accident) -Continue statin and  baby aspirin as above -when INR at goal will discontinue aspirin.     Anemia -Current hemoglobin stable and baseline of 10.3    Nonspecific abnormal electrocardiogram (ECG) (EKG)/QT prolongation -Current QTc within normal limits      DVT prophylaxis:lovenox, coumadin  Code Status: full code.  Family Communication: care discussed with patient.  Disposition Plan: remain in patient.    Consultants:   none   Procedures:   Doppler LE; positive for DVT right peroneal, tibial.   Antimicrobials:  none  Subjective: She is feeling better, breathing better.  Nursing providing care to trach   Objective: Vitals:   09/03/16 0339 09/03/16 0447 09/03/16 0740 09/03/16 0804  BP:  (!) 119/45  (!) 138/51  Pulse:  86 86 91  Resp:  20 20 20   Temp:  98.5 F (36.9 C)  98.1 F (36.7 C)  TempSrc:  Oral  Oral  SpO2: 98% 96% 93% 95%  Weight:  76.4 kg (168 lb 6.4 oz)    Height:       No intake or output data in the 24 hours ending 09/03/16 0931 Filed Weights   09/02/16 1000 09/03/16 0447  Weight: 74.4 kg (164 lb 0.4 oz) 76.4 kg (168 lb 6.4 oz)    Examination:  General exam: Appears calm and comfortable , trach in place.  Respiratory system: Clear to auscultation. Respiratory effort normal. Cardiovascular system: S1 & S2 heard, RRR. No JVD, murmurs, rubs, gallops or clicks. No pedal  edema. Gastrointestinal system: Abdomen is nondistended, soft and nontender. No organomegaly or masses felt. Normal bowel sounds heard. Central nervous system: Alert and oriented. No focal neurological deficits. Extremities: Symmetric 5 x 5 power. Skin: No rashes, lesions or ulcers Psychiatry: Judgement and insight appear normal. Mood & affect appropriate.     Data Reviewed: I have personally reviewed following labs and imaging studies  CBC:  Recent Labs Lab 09/02/16 0346 09/02/16 1139 09/03/16 0229  WBC  --  7.7 7.7  NEUTROABS  --  5.6  --   HGB 10.5* 10.3* 9.5*  HCT 31.0* 33.1* 30.6*    MCV  --  96.2 95.6  PLT  --  184 180   Basic Metabolic Panel:  Recent Labs Lab 09/02/16 0346 09/02/16 1139 09/02/16 1541 09/03/16 0229  NA 145 143  --  144  K 4.0 4.3  --  3.8  CL 105 108  --  107  CO2  --  28  --  29  GLUCOSE 101* 128*  --  100*  BUN 39* 37*  --  33*  CREATININE 1.50* 1.37*  --  1.40*  CALCIUM  --  8.6*  --  8.5*  MG  --   --  2.2  --   PHOS  --   --  4.4  --    GFR: Estimated Creatinine Clearance: 35.8 mL/min (by C-G formula based on SCr of 1.4 mg/dL (H)). Liver Function Tests:  Recent Labs Lab 09/03/16 0229  AST 19  ALT 18  ALKPHOS 71  BILITOT 0.6  PROT 5.8*  ALBUMIN 3.1*   No results for input(s): LIPASE, AMYLASE in the last 168 hours. No results for input(s): AMMONIA in the last 168 hours. Coagulation Profile:  Recent Labs Lab 09/02/16 1249 09/03/16 0229  INR 1.07 1.07   Cardiac Enzymes:  Recent Labs Lab 09/02/16 0338  CKTOTAL 162   BNP (last 3 results) No results for input(s): PROBNP in the last 8760 hours. HbA1C:  Recent Labs  09/02/16 1139  HGBA1C 8.7*   CBG:  Recent Labs Lab 09/02/16 1643 09/02/16 2045 09/03/16 0623 09/03/16 0657 09/03/16 0757  GLUCAP 226* 194* 57* 59* 146*   Lipid Profile: No results for input(s): CHOL, HDL, LDLCALC, TRIG, CHOLHDL, LDLDIRECT in the last 72 hours. Thyroid Function Tests: No results for input(s): TSH, T4TOTAL, FREET4, T3FREE, THYROIDAB in the last 72 hours. Anemia Panel: No results for input(s): VITAMINB12, FOLATE, FERRITIN, TIBC, IRON, RETICCTPCT in the last 72 hours. Sepsis Labs: No results for input(s): PROCALCITON, LATICACIDVEN in the last 168 hours.  No results found for this or any previous visit (from the past 240 hour(s)).       Radiology Studies: Dg Tibia/fibula Right  Result Date: 09/02/2016 CLINICAL DATA:  Right leg pain for 2 days, no known injury EXAM: RIGHT TIBIA AND FIBULA - 2 VIEW COMPARISON:  None FINDINGS: Within the soft tissues of right calf and  knee are vascular clips along the medial and posterior aspect. Popliteal and tibial arteriosclerosis is identified. There is osteoarthritic joint space narrowing with spurring of the femorotibial and patellofemoral compartment. Small joint effusion is noted. No intra-articular loose body in the knee. Ankle mortise is maintained. No acute fracture bone destruction. IMPRESSION: Osteoarthritis of the knee with small joint effusion. Arteriosclerosis along the course of the visualized popliteal and tibial arteries. Electronically Signed   By: Tollie Ethavid  Kwon M.D.   On: 09/02/2016 04:14   Dg Ankle Complete Right  Result Date: 09/02/2016 CLINICAL DATA:  Right leg pain for 2 days without no known injury. EXAM: RIGHT ANKLE - COMPLETE 3+ VIEW COMPARISON:  None. FINDINGS: The ankle mortise is maintained. No acute displaced appearing fracture nor bone destruction is noted. The base of the fifth metatarsal is intact. Small calcaneal enthesophyte is seen along the plantar aspect with small ossification posterior to the calcaneus. No joint effusion. IMPRESSION: No acute osseous abnormality.  Tiny calcaneal enthesophytes. Electronically Signed   By: Tollie Ethavid  Kwon M.D.   On: 09/02/2016 04:09   Ct Chest Wo Contrast  Result Date: 09/02/2016 CLINICAL DATA:  70 year old female with a history of shortness of breath EXAM: CT CHEST WITHOUT CONTRAST TECHNIQUE: Multidetector CT imaging of the chest was performed following the standard protocol without IV contrast. COMPARISON:  CT 12/11/2015 FINDINGS: Cardiovascular: Re- demonstration of cardiomegaly. Median sternotomy. Surgical changes of CABG with native coronary calcifications/ stenting. No pericardial fluid/ thickening. Calcifications of the aortic arch, branch vessels, descending thoracic aorta. No aneurysm of the thoracic aorta. No periaortic fluid. Diameter of the main pulmonary artery measures 3.8 cm. Mediastinum/Nodes: Compare to the prior CT there is interval enlargement of  multiple mediastinal lymph nodes. Index lymph nodes in the peritracheal nodal station on the right measure 1.3 cm in greatest short axis dimension. Lymph nodes in the supraclavicular region and lower cervical nodal stations. Lungs/Pleura: Diffuse interlobular septal thickening. Interval development of centrilobular nodularity/ ground-glass of the left upper lobe. Trace bilateral pleural effusions. Mixed ground-glass and nodular opacities of the lung bases. Tracheostomy tube in position. There is debris at the tip of the tracheostomy tube. Upper Abdomen: Calcifications of the abdominal aorta. Small hiatal hernia. Musculoskeletal: No displaced fracture. Degenerative changes of the spine. Healing left second rib fracture with callus formation. IMPRESSION: Bilateral interlobular septal thickening with developing nodular/ground-glass opacity of the left greater than right lungs, most compatible with edema, however, atypical infection could have this appearance. Debris at the tip of tracheostomy tube, potentially mucous/ secretions. Cardiomegaly. Surgical changes of prior median sternotomy and CABG. Associated aortic atherosclerosis. Healing left second rib fracture Signed, Yvone NeuJaime S. Loreta AveWagner, DO [facility] Electronically Signed   By: Gilmer MorJaime  Wagner D.O.   On: 09/02/2016 15:19   Dg Chest Portable 1 View  Result Date: 09/02/2016 CLINICAL DATA:  Hypoxia tonight.  Tracheostomy patient. EXAM: PORTABLE CHEST 1 VIEW COMPARISON:  07/03/2016 FINDINGS: Postoperative changes in the mediastinum. Tracheostomy. Tip measures 5 cm above the carina. Cardiac enlargement with mild perihilar infiltration suggesting edema. No blunting of costophrenic angles. No pneumothorax. Calcified and tortuous aorta. IMPRESSION: Cardiac enlargement with mild perihilar infiltration suggesting edema. Electronically Signed   By: Burman NievesWilliam  Stevens M.D.   On: 09/02/2016 05:44   Dg Foot Complete  Right  Result Date: 09/02/2016 CLINICAL DATA:  Right leg pain for 2 days.  No known injury. EXAM: RIGHT FOOT COMPLETE - 3+ VIEW COMPARISON:  None. FINDINGS: There is slight joint space narrowing of the first MTP articulation. Proximal phalanx medially with sclerotic appearing margins suggesting a subacute to chronic injury. Calcaneal enthesophytes are identified along the plantar and dorsal aspect of the foot. No bone destruction is seen. IMPRESSION: Subacute to chronic appearing fifth proximal phalangeal fracture. No acute osseous abnormality. Degenerative joint space narrowing of the first MTP. Electronically Signed   By: Tollie Ethavid  Kwon M.D.   On: 09/02/2016 04:11        Scheduled Meds: . aspirin EC  81 mg Oral Daily  . carvedilol  12.5 mg Oral BID WC  .  dicyclomine  20 mg Oral TID AC  . DULoxetine  60 mg Oral QHS  . enoxaparin (LOVENOX) injection  80 mg Subcutaneous Q24H  . ferrous sulfate  325 mg Oral Q breakfast  . gabapentin  300 mg Oral QHS  . hydrALAZINE  25 mg Oral TID  . insulin aspart  0-5 Units Subcutaneous QHS  . insulin aspart  0-9 Units Subcutaneous TID WC  . insulin glargine  24 Units Subcutaneous QHS  . isosorbide mononitrate  15 mg Oral Daily  . levothyroxine  75 mcg Oral QAC breakfast  . multivitamin with minerals  1 tablet Oral Daily  . pantoprazole  20 mg Oral Daily  . simvastatin  40 mg Oral QPM  . tiZANidine  2 mg Oral Daily  . torsemide  40 mg Oral Daily  . Warfarin - Pharmacist Dosing Inpatient   Does not apply q1800   Continuous Infusions:   LOS: 1 day    Time spent: 35 minutes.     Alba Cory, MD [facility] Pager [phone]  If 7PM-7AM, please contact night-coverage [email or web address] Password [email or web address] 09/03/2016, 9:31 AM

## 2016-09-03 NOTE — Progress Notes (Signed)
PT Cancellation Note  Patient Details Name: [name] MRN: [id number] DOB: 02/19/1946   Cancelled Treatment:    Reason Eval/Treat Not Completed: Medical issues which prohibited therapy.  Has newly diagnosed fracture R foot and nursing talked with PT about holding until MD comments on restrictions, if any are needed.   [name], [name] 09/03/2016, 8:56 AM    [name], PT MS Acute Rehab Dept. Number: [facility] [phone] and [facility] [phone]

## 2016-09-03 NOTE — Progress Notes (Signed)
ANTICOAGULATION CONSULT NOTE  Pharmacy Consult for Lovenox/warfarin Indication: DVT   Assessment: [age] yof presented to the ED with leg pain and found to have an acute DVT. Started warfarin with lovenox bridge day 2/5 of recommended VTE overlap.  INR unchanged after warfarin last night. CBC down slightly but no bleeding issues have been noted. She continues on once daily lovenox as her renal function is borderline.   Goal of Therapy:  INR 2-3 Monitor platelets by anticoagulation protocol: Yes   Plan:  - Repeat Warfarin 5mg  tonight - No change in lovenox today - 80mg  q24 hours - Daily INR/CBC q72 hours   Allergies  Allergen Reactions  . Aldactone [Spironolactone] Other (See Comments)    Severe hyperkalemia   . Lisinopril Other (See Comments) and Cough    Hypotension also  . Crestor [Rosuvastatin Calcium] Other (See Comments)    Muscle Pain  . Vicodin [Hydrocodone-Acetaminophen] Nausea And Vomiting    Patient Measurements: Height: 5\' 2"  (157.5 cm) Weight: 168 lb 6.4 oz (76.4 kg) IBW/kg (Calculated) : 50.1  Vital Signs: Temp: 98.1 F (36.7 C) (11/11 0804) Temp Source: Oral (11/11 0804) BP: 138/51 (11/11 0804) Pulse Rate: 91 (11/11 0804)  Labs:  Recent Labs  09/02/16 0338  09/02/16 0346 09/02/16 1139 09/02/16 1249 09/03/16 0229  HGB  --   < > 10.5* 10.3*  --  9.5*  HCT  --   --  31.0* 33.1*  --  30.6*  PLT  --   --   --  184  --  180  LABPROT  --   --   --   --  13.9 14.0  INR  --   --   --   --  1.07 1.07  CREATININE  --   --  1.50* 1.37*  --  1.40*  CKTOTAL 162  --   --   --   --   --   < > = values in this interval not displayed.  Estimated Creatinine Clearance: 35.8 mL/min (by C-G formula based on SCr of 1.4 mg/dL (H)).  Sheppard CoilFrank Claborn Janusz PharmD., BCPS Clinical Pharmacist Pager [phone] 09/03/2016 10:47 AM

## 2016-09-03 NOTE — Progress Notes (Signed)
09/03/2016 1500 Pt pressed call bell to say she fell.  Found pt. On her knees in a puddle of urine.  Asked pt why she did not call, she stated she couldn't wait she had to pee right away.  Vital signs taken and were stable.  Pt assessed, no injuries noted and she is without complaints of pain.   Pt helped back to bed.  Bed alarm on.  Encouraged pt to call for help.  Dr. Sunnie Nielsenegalado paged and pt will call daughter to let family know about the fall.  Call bell in reach. Kathryne HitchAllen, Mehmet Scally C

## 2016-09-04 LAB — CBC
HEMATOCRIT: 30.7 % — AB (ref 36.0–46.0)
HEMOGLOBIN: 9.6 g/dL — AB (ref 12.0–15.0)
MCH: 29.6 pg (ref 26.0–34.0)
MCHC: 31.3 g/dL (ref 30.0–36.0)
MCV: 94.8 fL (ref 78.0–100.0)
Platelets: 200 10*3/uL (ref 150–400)
RBC: 3.24 MIL/uL — AB (ref 3.87–5.11)
RDW: 16.9 % — ABNORMAL HIGH (ref 11.5–15.5)
WBC: 9.4 10*3/uL (ref 4.0–10.5)

## 2016-09-04 LAB — BASIC METABOLIC PANEL
ANION GAP: 9 (ref 5–15)
BUN: 37 mg/dL — ABNORMAL HIGH (ref 6–20)
CHLORIDE: 101 mmol/L (ref 101–111)
CO2: 28 mmol/L (ref 22–32)
Calcium: 8.7 mg/dL — ABNORMAL LOW (ref 8.9–10.3)
Creatinine, Ser: 1.94 mg/dL — ABNORMAL HIGH (ref 0.44–1.00)
GFR calc non Af Amer: 25 mL/min — ABNORMAL LOW (ref >60)
GFR, EST AFRICAN AMERICAN: 29 mL/min — AB (ref >60)
Glucose, Bld: 110 mg/dL — ABNORMAL HIGH (ref 65–99)
POTASSIUM: 4.1 mmol/L (ref 3.5–5.1)
SODIUM: 138 mmol/L (ref 135–145)

## 2016-09-04 LAB — GLUCOSE, CAPILLARY
GLUCOSE-CAPILLARY: 162 mg/dL — AB (ref 65–99)
Glucose-Capillary: 54 mg/dL — ABNORMAL LOW (ref 65–99)
Glucose-Capillary: 121 mg/dL — ABNORMAL HIGH (ref 65–99)
Glucose-Capillary: 269 mg/dL — ABNORMAL HIGH (ref 65–99)
Glucose-Capillary: 135 mg/dL — ABNORMAL HIGH (ref 65–99)

## 2016-09-04 LAB — PROTIME-INR
INR: 1.09
PROTHROMBIN TIME: 14.1 s (ref 11.4–15.2)

## 2016-09-04 MED ORDER — WARFARIN SODIUM 7.5 MG PO TABS
7.5000 mg | ORAL_TABLET | Freq: Once | ORAL | Status: AC
  Administered 2016-09-04: 7.5 mg via ORAL
  Filled 2016-09-04: qty 1

## 2016-09-04 MED ORDER — INSULIN GLARGINE 100 UNIT/ML ~~LOC~~ SOLN: 10.0000 [IU] | Freq: Every day | SUBCUTANEOUS | Status: DC

## 2016-09-04 NOTE — Progress Notes (Signed)
ANTICOAGULATION CONSULT NOTE  Pharmacy Consult for Lovenox/warfarin Indication: DVT   Assessment: [age] yof presented to the ED with leg pain and found to have an acute DVT. Started warfarin with lovenox bridge day 2/5 of recommended VTE overlap.  INR continues to be unchanged after warfarin last night. CBC stable overnight with no bleeding issues have been noted. She continues on once daily lovenox as her renal function is borderline.   Noted patient had a fall yesterday but no injuries reported.   Goal of Therapy:  INR 2-3 Monitor platelets by anticoagulation protocol: Yes   Plan:  - Give Warfarin 7.5mg  tonight - No change in lovenox today - 80mg  q24 hours - Daily INR/CBC q72 hours   Allergies  Allergen Reactions  . Aldactone [Spironolactone] Other (See Comments)    Severe hyperkalemia   . Lisinopril Other (See Comments) and Cough    Hypotension also  . Crestor [Rosuvastatin Calcium] Other (See Comments)    Muscle Pain  . Vicodin [Hydrocodone-Acetaminophen] Nausea And Vomiting    Patient Measurements: Height: 5\' 2"  (157.5 cm) Weight: 170 lb 1.6 oz (77.2 kg) IBW/kg (Calculated) : 50.1  Vital Signs: Temp: 99.1 F (37.3 C) (11/12 0810) Temp Source: Oral (11/12 0810) BP: 130/54 (11/12 0810) Pulse Rate: 84 (11/12 0810)  Labs:  Recent Labs  09/02/16 0338  09/02/16 1139 09/02/16 1249 09/03/16 0229 09/04/16 0135  HGB  --   < > 10.3*  --  9.5* 9.6*  HCT  --   < > 33.1*  --  30.6* 30.7*  PLT  --   --  184  --  180 200  LABPROT  --   --   --  13.9 14.0 14.1  INR  --   --   --  1.07 1.07 1.09  CREATININE  --   < > 1.37*  --  1.40* 1.94*  CKTOTAL 162  --   --   --   --   --   < > = values in this interval not displayed.  Estimated Creatinine Clearance: 25.9 mL/min (by C-G formula based on SCr of 1.94 mg/dL (H)).  Sheppard CoilFrank Kholton Coate PharmD., BCPS Clinical Pharmacist Pager [phone] 09/04/2016 10:42 AM

## 2016-09-04 NOTE — Evaluation (Signed)
Physical Therapy Evaluation Patient Details Name: [name] MRN: [id number] DOB: 11/09/1945 Today's Date: 09/04/2016   History of Present Illness  pt presents with R LE DVT and found to have R 5th phalanx subacute fx.  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    Clinical Impression  Pt very painful in R foot and calf limiting pt's ability to bear weight and was only able to transfer to recliner and not ambulate at this time.  Discussed with pt need for further rehab prior to returning to home and pt was in agreement at this time.  Will continue to follow.      Follow Up Recommendations SNF    Equipment Recommendations  None recommended by PT    Recommendations for Other Services       Precautions / Restrictions Precautions Precautions: Fall Precaution Comments: Chronic Trach Restrictions Weight Bearing Restrictions: No Other Position/Activity Restrictions: Per notes pt to f/u as outpatient for subacute R 5th phalanx fx.        Mobility  Bed Mobility Overal bed mobility: Needs Assistance Bed Mobility: Supine to Sit     Supine to sit: Min assist;HOB elevated     General bed mobility comments: pt moves slowly and needs MinA for steadying trunk with coming to sitting.  pt with heavy use of UEs and needed HOB elevated.    Transfers Overall transfer level: Needs assistance Equipment used: Rolling walker (2 wheeled) Transfers: Sit to/from UGI CorporationStand;Stand Pivot Transfers Sit to Stand: Mod assist Stand pivot transfers: Min assist       General transfer comment: cues for UE use and completing pivot prior to sitting in recliner as pt tends to start sitting when only half way to recliner.  pt only able to minimally bear weight on R foot due to pain.    Ambulation/Gait                Stairs            Wheelchair Mobility    Modified Rankin (Stroke Patients Only)        Balance Overall balance assessment: Needs assistance Sitting-balance support: No upper extremity supported;Feet supported Sitting balance-Leahy Scale: Good     Standing balance support: Bilateral upper extremity supported;During functional activity Standing balance-Leahy Scale: Poor                               Pertinent Vitals/Pain Pain Assessment: 0-10 Pain Score: 7  Pain Location: R foot and calf in standing.   Pain Descriptors / Indicators: Aching;Grimacing;Guarding Pain Intervention(s): Monitored during session;Premedicated before session;Repositioned;RN gave pain meds during session    Home Living Family/patient expects to be discharged to:: Private residence Living Arrangements: Children Available Help at Discharge: Family;Available PRN/intermittently (daughter works) Type of Home: House Home Access: Level entry     Home Layout: One level Home Equipment: Environmental consultantWalker - 2 wheels;Cane - single point;Bedside commode;Shower seat;Hand held shower head      Prior Function Level of Independence: Independent with assistive device(s)         Comments: Uses cane or RW depending on how she feels.       Hand Dominance   Dominant Hand: Right    Extremity/Trunk Assessment   Upper Extremity Assessment: Generalized weakness           Lower Extremity Assessment: Generalized weakness;RLE deficits/detail;LLE  deficits/detail RLE Deficits / Details: AROM WFL, but strength limited in knee/ankle due to pain.  Sensation intact.   LLE Deficits / Details: Per chart pt with L quad strain and muscle tear, but pt denies problems with L LE.  Sensation intact and just overall generally weak.    Cervical / Trunk Assessment: Kyphotic  Communication   Communication: No difficulties  Cognition Arousal/Alertness: Awake/alert Behavior During Therapy: WFL for tasks assessed/performed Overall Cognitive Status: Within Functional Limits for tasks assessed                       General Comments      Exercises     Assessment/Plan    PT Assessment Patient needs continued PT services  PT Problem List Decreased strength;Decreased activity tolerance;Decreased balance;Decreased mobility;Decreased knowledge of use of DME;Pain          PT Treatment Interventions DME instruction;Gait training;Functional mobility training;Therapeutic activities;Therapeutic exercise;Balance training;Patient/family education    PT Goals (Current goals can be found in the Care Plan section)  Acute Rehab PT Goals Patient Stated Goal: Walk better again PT Goal Formulation: With patient Time For Goal Achievement: 09/18/16 Potential to Achieve Goals: Good    Frequency Min 3X/week   Barriers to discharge Decreased caregiver support      Co-evaluation               End of Session Equipment Utilized During Treatment: Gait belt;Oxygen Activity Tolerance: Patient limited by pain Patient left: in chair;with call bell/phone within reach;with nursing/sitter in room Nurse Communication: Mobility status         Time: [id number] PT Time Calculation (min) (ACUTE ONLY): 28 min   Charges:   PT Evaluation $PT Eval Moderate Complexity: 1 Procedure PT Treatments $Therapeutic Activity: 8-22 mins   PT G CodesSunny Schlein:        Lanitra Battaglini F, [place] CarolinaPT [phone] 09/04/2016, 12:56 PM

## 2016-09-04 NOTE — Progress Notes (Signed)
Hypoglycemic Event  CBG:54  Treatment: 1 cup of apple juice and Graham crackers  Symptoms:none voiced by patient  Follow-up CBG: Time: will notify oncoming nurse and rechek blood sugar CBG Result  Possible Reasons for Event: disease process Comments/MD notified    Najeh Credit R Latima Hamza

## 2016-09-04 NOTE — Progress Notes (Signed)
PROGRESS NOTE    [name]  HYQ:[id number] DOB: 10/12/1946 DOA: 09/02/2016 PCP: Cala Bradford, MD   Brief Narrative: [name] is a 70 y.o. female with medical history significant for chronic tracheostomy for tracheal stenosis in setting of O2 dependent severe COPD, diabetes, chronic combined diastolic and systolic heart failure with an EF of 30-35% , hypertension, chronic anemia, history of prior CVA, dyslipidemia,. She was last hospitalized in early September 2017 with lower GI bleeding presumably related to diverticular etiology. She required 2 units PRBCs during that admission. Symptoms were felt related to her antiplatelet agents of Plavix was held and scheduled to be resumed on 9/12. At follow-up cardiology visit on 10/11 patient's cardiologist opted to stop the Plavix given her history of GI bleeding but continued her aspirin. From a cardiology standpoint patient has not had any chest pain or shortness of breath. Patient and family report that at some point in September while attempting to board a [employer] bus there was an issue with the chemical step not being completely deployed and while the patient was attempting to step on this device the driver apparently was able to deploy the step therefore it dropped rapidly and the patient fell injuring her left leg. Since that time she has had significant pain in the groin and hip region and has been undergoing home PT and has been taking Norco without any improvement of her symptoms. She was referred to neurosurgeon Dr. Farris Has. An MRI of the left hip was obtained on 11/4. She has not yet followed up with Dr. Farris Has to receive this report but I was able to pull up the MRI and discuss the results with the patient and family. 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 For the past several days the patient has had pain in her right calf and presented to the emergency department for further evaluation. Lower extremity venous duplex has revealed an acute DVT involving the right posterior tibial and peroneal veins. EDP did discuss with vascular services and given patient's recent immobility and deconditioning full anti-coagulation was recommended. Initial plan was to have the patient go home on warfarin with Lovenox bridging but after further chart investigation it was discovered the patient had a lower GI bleeding admission with requirement for transfusion back in September and therefore it was best felt to initiate anticoagulation in the hospital setting.   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   Subglottic stenosis w/chronic tracheostomy   Anemia   Nonspecific abnormal electrocardiogram (ECG) (EKG)/QT prolongation   Acute on chronic respiratory failure with hypoxia (HCC)   COPD, severe (HCC)   CKD (chronic kidney disease) stage 4, GFR 15-29 ml/min (HCC)   History of lower GI bleeding-presumed diverticular   Strain and tear of left hip adductor muscle and quadricep muscle   Acute deep vein thrombosis (DVT)  -Presented with right calf pain with duplex imaging consistent with acute DVT involving right posterior tibial and peroneal veins -EDP discussed with vascular  services with recommendation for full dose anticoagulation -Given low GFR/chronic kidney disease not candidate for NOAC -continue with Lovenox, coumadin.  -INR 1.0 Day 3 bridge.     Acute on chronic respiratory failure with  hypoxia/COPD, severe/Subglottic stenosis w/chronic tracheostomy  -Patient on chronic 4 L bleed in to trach collar at home but currently requiring a bleed in of 6 L. -CT chest showed pulmonary edema, received lasix. Hypoxemia improved. Also there was debrides at the tip of tracheostomy tube. Nurse has provided trach care.  -continue with torsemide.     CKD (chronic kidney disease) stage 4, GFR 15-29 ml/min  -Current renal function stable. Last cr 1.5---1.7 -hold demadex.     History of lower GI bleeding-presumed diverticular -Required 2 units PRBCS during hospitalization in early September -Plavix was discontinued in favor of aspirin alone in October -No further GI bleeding symptoms since previous admission -Monitor closely with initiation of anticoagulation for acute DVT    Strain and tear of left hip adductor muscle and quadricep muscle -Occurred nearly 2 months ago after mechanical fall -Patient with persistent pain primarily with weightbearing and ambulation -PT and OT evaluation -will inform to Dr Farris HasKramer that patient is in the hospital '    CAD (coronary artery disease) -Currently asymptomatic -Plavix discontinued on 10/11 by cardiologist due to recent GI bleeding symptoms -Continue baby aspirin -Continue carvedilol, Zocor and Imdur    Acute on Chronic combined systolic and diastolic CHF, NYHA class 2 (EF 30-35%) per ECHO 2017 -Continue home carvedilol, hydralazine -Current weight stable at 164 pounds and has increased from a nadir of around 154 pounds in late August -received 80 mg IV lasix.  hold torsemide , cr increased to 1.9.     Diabetes mellitus type 2, uncontrolled  -Mild hyperglycemia at presentation -hold Evaristo Buryresiba, use lantus while in the hospital.  -decrease lantus dose due to hypoglycemia./     Hypertension    Hyperlipidemia -Continue statin    Hypothyroidism status post thyroidectomy -Continue Synthroid    History of CVA (cerebrovascular  accident) -Continue statin and baby aspirin as above -when INR at goal will discontinue aspirin.     Anemia -Current hemoglobin stable and baseline of 10.3    Nonspecific abnormal electrocardiogram (ECG) (EKG)/QT prolongation -Current QTc within normal limits      DVT prophylaxis:lovenox, coumadin  Code Status: full code.  Family Communication: care discussed with patient.  Disposition Plan: remain in patient.    Consultants:   none   Procedures:   Doppler LE; positive for DVT right peroneal, tibial.   Antimicrobials:  none  Subjective: Denies dyspnea. Complaints of pain right leg and left knee.    Objective: Vitals:   09/04/16 0538 09/04/16 0750 09/04/16 0810 09/04/16 1149  BP: 128/60  (!) 130/54   Pulse: 78 82 84 73  Resp:  18 18 14   Temp: 98.9 F (37.2 C)  99.1 F (37.3 C)   TempSrc: Oral  Oral   SpO2: 93% 95% 91% 100%  Weight: 77.2 kg (170 lb 1.6 oz)     Height:       No intake or output data in the 24 hours ending 09/04/16 1240 Filed Weights   09/02/16 1000 09/03/16 0447 09/04/16 0538  Weight: 74.4 kg (164 lb 0.4 oz) 76.4 kg (168 lb 6.4 oz) 77.2 kg (170 lb 1.6 oz)    Examination:  General exam: Appears calm and comfortable , trach in place.  Respiratory system: Clear to auscultation. Respiratory effort normal. Cardiovascular system: S1 &  S2 heard, RRR. No JVD, murmurs, rubs, gallops or clicks. No pedal edema. Gastrointestinal system: Abdomen is nondistended, soft and nontender. No organomegaly or masses felt. Normal bowel sounds heard. Central nervous system: Alert and oriented. No focal neurological deficits. Extremities: Symmetric 5 x 5 power. Skin: No rashes, lesions or ulcers Psychiatry: Judgement and insight appear normal. Mood & affect appropriate.     Data Reviewed: I have personally reviewed following labs and imaging studies  CBC:  Recent Labs Lab 09/02/16 0346 09/02/16 1139 09/03/16 0229 09/04/16 0135  WBC  --  7.7 7.7 9.4   NEUTROABS  --  5.6  --   --   HGB 10.5* 10.3* 9.5* 9.6*  HCT 31.0* 33.1* 30.6* 30.7*  MCV  --  96.2 95.6 94.8  PLT  --  184 180 200   Basic Metabolic Panel:  Recent Labs Lab 09/02/16 0346 09/02/16 1139 09/02/16 1541 09/03/16 0229 09/04/16 0135  NA 145 143  --  144 138  K 4.0 4.3  --  3.8 4.1  CL 105 108  --  107 101  CO2  --  28  --  29 28  GLUCOSE 101* 128*  --  100* 110*  BUN 39* 37*  --  33* 37*  CREATININE 1.50* 1.37*  --  1.40* 1.94*  CALCIUM  --  8.6*  --  8.5* 8.7*  MG  --   --  2.2  --   --   PHOS  --   --  4.4  --   --    GFR: Estimated Creatinine Clearance: 25.9 mL/min (by C-G formula based on SCr of 1.94 mg/dL (H)). Liver Function Tests:  Recent Labs Lab 09/03/16 0229  AST 19  ALT 18  ALKPHOS 71  BILITOT 0.6  PROT 5.8*  ALBUMIN 3.1*   No results for input(s): LIPASE, AMYLASE in the last 168 hours. No results for input(s): AMMONIA in the last 168 hours. Coagulation Profile:  Recent Labs Lab 09/02/16 1249 09/03/16 0229 09/04/16 0135  INR 1.07 1.07 1.09   Cardiac Enzymes:  Recent Labs Lab 09/02/16 0338  CKTOTAL 162   BNP (last 3 results) No results for input(s): PROBNP in the last 8760 hours. HbA1C:  Recent Labs  09/02/16 1139  HGBA1C 8.7*   CBG:  Recent Labs Lab 09/03/16 1657 09/03/16 2129 09/04/16 0643 09/04/16 0739 09/04/16 1107  GLUCAP 73 107* 54* 121* 269*   Lipid Profile: No results for input(s): CHOL, HDL, LDLCALC, TRIG, CHOLHDL, LDLDIRECT in the last 72 hours. Thyroid Function Tests: No results for input(s): TSH, T4TOTAL, FREET4, T3FREE, THYROIDAB in the last 72 hours. Anemia Panel: No results for input(s): VITAMINB12, FOLATE, FERRITIN, TIBC, IRON, RETICCTPCT in the last 72 hours. Sepsis Labs: No results for input(s): PROCALCITON, LATICACIDVEN in the last 168 hours.  No results found for this or any previous visit (from the past 240 hour(s)).       Radiology Studies: Ct Chest Wo Contrast  Result Date:  09/02/2016 CLINICAL DATA:  [age] year old female with a history of shortness of breath EXAM: CT CHEST WITHOUT CONTRAST TECHNIQUE: Multidetector CT imaging of the chest was performed following the standard protocol without IV contrast. COMPARISON:  CT 12/11/2015 FINDINGS: Cardiovascular: Re- demonstration of cardiomegaly. Median sternotomy. Surgical changes of CABG with native coronary calcifications/ stenting. No pericardial fluid/ thickening. Calcifications of the aortic arch, branch vessels, descending thoracic aorta. No aneurysm of the thoracic aorta. No periaortic fluid. Diameter of the main pulmonary artery measures 3.8 cm. Mediastinum/Nodes: Compare to  the prior CT there is interval enlargement of multiple mediastinal lymph nodes. Index lymph nodes in the peritracheal nodal station on the right measure 1.3 cm in greatest short axis dimension. Lymph nodes in the supraclavicular region and lower cervical nodal stations. Lungs/Pleura: Diffuse interlobular septal thickening. Interval development of centrilobular nodularity/ ground-glass of the left upper lobe. Trace bilateral pleural effusions. Mixed ground-glass and nodular opacities of the lung bases. Tracheostomy tube in position. There is debris at the tip of the tracheostomy tube. Upper Abdomen: Calcifications of the abdominal aorta. Small hiatal hernia. Musculoskeletal: No displaced fracture. Degenerative changes of the spine. Healing left second rib fracture with callus formation. IMPRESSION: Bilateral interlobular septal thickening with developing nodular/ground-glass opacity of the left greater than right lungs, most compatible with edema, however, atypical infection could have this appearance. Debris at the tip of tracheostomy tube, potentially mucous/ secretions. Cardiomegaly. Surgical changes of prior median sternotomy and CABG. Associated aortic atherosclerosis. Healing left second rib fracture Signed, Yvone Neu. Loreta Ave, DO [facility] Radiology Electronically Signed   By: Gilmer Mor D.O.   On: 09/02/2016 15:19        Scheduled Meds: . aspirin EC  81 mg Oral Daily  . carvedilol  12.5 mg Oral BID WC  . dicyclomine  20 mg Oral TID AC  . DULoxetine  60 mg Oral QHS  . enoxaparin (LOVENOX) injection  80 mg Subcutaneous Q24H  . ferrous sulfate  325 mg Oral BID WC  . gabapentin  300 mg Oral QHS  . hydrALAZINE  25 mg Oral TID  . insulin aspart  0-5 Units Subcutaneous QHS  . insulin aspart  0-9 Units Subcutaneous TID WC  . isosorbide mononitrate  15 mg Oral Daily  . levothyroxine  75 mcg Oral QAC breakfast  . multivitamin with minerals  1 tablet Oral Daily  . pantoprazole  20 mg Oral Daily  . polyethylene glycol  17 g Oral Daily  . simvastatin  40 mg Oral QPM  . tiZANidine  2 mg Oral Daily  . torsemide  40 mg Oral Daily  . warfarin  7.5 mg Oral ONCE-1800  . Warfarin - Pharmacist Dosing Inpatient   Does not apply q1800   Continuous Infusions:   LOS: 2 days    Time spent: 35 minutes.     Alba Cory, MD [facility] Pager [phone]  If 7PM-7AM, please contact night-coverage [email or web address] Password [email or web address] 09/04/2016, 12:40 PM

## 2016-09-04 NOTE — Progress Notes (Signed)
Pharmacist Heart Failure Core Measure Documentation  Assessment: Scot JunDorothy I Brandis has an EF documented as 30-35% on echo  Rationale: Heart failure patients with left ventricular systolic dysfunction (LVSD) and an EF < 40% should be prescribed an angiotensin converting enzyme inhibitor (ACEI) or angiotensin receptor blocker (ARB) at discharge unless a contraindication is documented in the medical record.  This patient is not currently on an ACEI or ARB for HF.  This note is being placed in the record in order to provide documentation that a contraindication to the use of these agents is present for this encounter.  ACE Inhibitor or Angiotensin Receptor Blocker is contraindicated (specify all that apply)  []   ACEI allergy AND ARB allergy []   Angioedema []   Moderate or severe aortic stenosis []   Hyperkalemia []   Hypotension []   Renal artery stenosis [x]   Worsening renal function, preexisting renal disease or dysfunction   Severiano GilbertWilson, Catlyn Shipton Rhea 09/04/2016 1:18 PM

## 2016-09-05 ENCOUNTER — Inpatient Hospital Stay (HOSPITAL_COMMUNITY): Payer: Medicare Other

## 2016-09-05 DIAGNOSIS — J9621 Acute and chronic respiratory failure with hypoxia: Secondary | ICD-10-CM

## 2016-09-05 LAB — BASIC METABOLIC PANEL
Anion gap: 7 (ref 5–15)
BUN: 37 mg/dL — AB (ref 6–20)
CHLORIDE: 100 mmol/L — AB (ref 101–111)
CO2: 32 mmol/L (ref 22–32)
Calcium: 8.8 mg/dL — ABNORMAL LOW (ref 8.9–10.3)
Creatinine, Ser: 1.7 mg/dL — ABNORMAL HIGH (ref 0.44–1.00)
GFR calc Af Amer: 34 mL/min — ABNORMAL LOW (ref >60)
GFR calc non Af Amer: 29 mL/min — ABNORMAL LOW (ref >60)
GLUCOSE: 142 mg/dL — AB (ref 65–99)
POTASSIUM: 4.6 mmol/L (ref 3.5–5.1)
Sodium: 139 mmol/L (ref 135–145)

## 2016-09-05 LAB — PROTIME-INR
INR: 1.32
Prothrombin Time: 16.5 seconds — ABNORMAL HIGH (ref 11.4–15.2)

## 2016-09-05 LAB — GLUCOSE, CAPILLARY
GLUCOSE-CAPILLARY: 261 mg/dL — AB (ref 65–99)
Glucose-Capillary: 160 mg/dL — ABNORMAL HIGH (ref 65–99)
Glucose-Capillary: 197 mg/dL — ABNORMAL HIGH (ref 65–99)
Glucose-Capillary: 183 mg/dL — ABNORMAL HIGH (ref 65–99)

## 2016-09-05 MED ORDER — BUDESONIDE 0.25 MG/2ML IN SUSP
0.2500 mg | Freq: Two times a day (BID) | RESPIRATORY_TRACT | Status: DC
  Administered 2016-09-05 – 2016-09-09 (×8): 0.25 mg via RESPIRATORY_TRACT
  Filled 2016-09-05 (×10): qty 2

## 2016-09-05 MED ORDER — TORSEMIDE 20 MG PO TABS
40.0000 mg | ORAL_TABLET | Freq: Every day | ORAL | Status: DC
  Administered 2016-09-05 – 2016-09-09 (×5): 40 mg via ORAL
  Filled 2016-09-05 (×5): qty 2

## 2016-09-05 MED ORDER — IPRATROPIUM-ALBUTEROL 0.5-2.5 (3) MG/3ML IN SOLN
3.0000 mL | Freq: Three times a day (TID) | RESPIRATORY_TRACT | Status: DC
  Administered 2016-09-05 – 2016-09-06 (×5): 3 mL via RESPIRATORY_TRACT
  Filled 2016-09-05 (×5): qty 3

## 2016-09-05 MED ORDER — WARFARIN SODIUM 7.5 MG PO TABS
7.5000 mg | ORAL_TABLET | Freq: Once | ORAL | Status: AC
  Administered 2016-09-05: 7.5 mg via ORAL
  Filled 2016-09-05: qty 1

## 2016-09-05 NOTE — Consult Note (Signed)
   [facility]   09/05/2016  [name] 11/13/1945 [id number]     [facility] Chart reviewed. Patient is currently active with [facility], [facility]. Telephone call made [facility] Pa[facility] with [facility] at [phone] to confirm that [facility] is still following. Also discussed that it appears the discharge plan may be for SNF.  [facility] not appropriate at this time. Made inpatient RNCM aware that [name] has been followed by [facility].  Raiford NobleAtika Uno Esau, MSN-Ed, RN,BSN [facility] Care [facility] Hospital Liaison [phone]    Ileane Sando, MSN-Ed, RN,BSN [facility] [phone]

## 2016-09-05 NOTE — Progress Notes (Addendum)
PROGRESS NOTE    Becky JunDorothy I Braddock  BJY:[id number] DOB: 11/12/1945 DOA: 09/02/2016 PCP: [name],CYNTHIA S, MD   Brief Narrative: [name] is a 70 y.o. female with medical history significant for chronic tracheostomy for tracheal stenosis in setting of O2 dependent severe COPD, diabetes, chronic combined diastolic and systolic heart failure with an EF of 30-35% , hypertension, chronic anemia, history of prior CVA, dyslipidemia,. She was last hospitalized in early September 2017 with lower GI bleeding presumably related to diverticular etiology. She required 2 units PRBCs during that admission. Symptoms were felt related to her antiplatelet agents of Plavix was held and scheduled to be resumed on 9/12. At follow-up cardiology visit on 10/11 patient's cardiologist opted to stop the Plavix given her history of GI bleeding but continued her aspirin. From a cardiology standpoint patient has not had any chest pain or shortness of breath. Patient and family report that at some point in September while attempting to board a [employer] bus there was an issue with the chemical step not being completely deployed and while the patient was attempting to step on this device the driver apparently was able to deploy the step therefore it dropped rapidly and the patient fell injuring her left leg. Since that time she has had significant pain in the groin and hip region and has been undergoing home PT and has been taking Norco without any improvement of her symptoms. She was referred to neurosurgeon Dr. Farris HasKramer. An MRI of the left hip was obtained on 11/4. She has not yet followed up with Dr. Farris HasKramer to receive this report but I was able to pull up the MRI and discuss the results with the patient and family. 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 For the past several days the patient has had pain in her right calf and presented to the emergency department for further evaluation. Lower extremity venous duplex has revealed an acute DVT involving the right posterior tibial and peroneal veins. EDP did discuss with vascular services and given patient's recent immobility and deconditioning full anti-coagulation was recommended. Initial plan was to have the patient go home on warfarin with Lovenox bridging but after further chart investigation it was discovered the patient had a lower GI bleeding admission with requirement for transfusion back in September and therefore it was best felt to initiate anticoagulation in the hospital setting.   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   Subglottic stenosis w/chronic tracheostomy   Anemia   Nonspecific abnormal electrocardiogram (ECG) (EKG)/QT prolongation   Acute on chronic respiratory failure with hypoxia (HCC)   COPD, severe (HCC)   CKD (chronic kidney disease) stage 4, GFR 15-29 ml/min (HCC)   History of lower GI bleeding-presumed diverticular   Strain and tear of left hip adductor muscle and quadricep muscle   Right Acute deep vein thrombosis (DVT)  -Presented with right calf pain with duplex imaging consistent with acute DVT involving right posterior tibial and peroneal veins -EDP discussed with  vascular services with recommendation for full dose anticoagulation -Given low GFR/chronic kidney disease not candidate for NOAC -continue with Lovenox, coumadin.  -INR 1.3 Day 4 bridge. Needs to be on lovenox until INR at goal     Acute  on chronic respiratory failure with hypoxia/COPD, severe/Subglottic stenosis w/chronic tracheostomy  -Patient on chronic 4 L bleed in to trach collar at home but currently requiring a bleed in of 6 L. -CT chest showed pulmonary edema, received lasix. Hypoxemia improved. Also there was debrides at the tip of tracheostomy tube. Nurse has provided trach care.  -continue with torsemide.  -Back on 10 L of oxygen, denies dyspnea. Will have CCM to review trach functioning.  -continue with demadex.     CKD (chronic kidney disease) stage 4, GFR 15-29 ml/min  -Current renal function stable. Last cr 1.5---1.7 -resume demadex. Ct at 1.7.     History of lower GI bleeding-presumed diverticular -Required 2 units PRBCS during hospitalization in early September -Plavix was discontinued in favor of aspirin alone in October -No further GI bleeding symptoms since previous admission -Monitor closely with initiation of anticoagulation for acute DVT -repeat hb in am.     Strain and tear of left hip adductor muscle and quadricep muscle -Occurred nearly 2 months ago after mechanical fall -Patient with persistent pain primarily with weightbearing and ambulation -PT and OT evaluation -Dr Farris HasKramer recommend outpatient follow up.     CAD (coronary artery disease) -Currently asymptomatic -Plavix discontinued on 10/11 by cardiologist due to recent GI bleeding symptoms -Continue baby aspirin -Continue carvedilol, Zocor and Imdur    Acute on Chronic combined systolic and diastolic CHF, NYHA class 2 (EF 30-35%) per ECHO 2017 -Continue home carvedilol, hydralazine -Current weight stable at 164 pounds and has increased from a nadir of around 154 pounds in late August -received 80 mg IV lasix.  -continue with  Torsemide/     Diabetes mellitus type 2, uncontrolled  -Mild hyperglycemia at presentation -hold Tresiba, use lantus while in the hospital.  -decrease lantus dose due to hypoglycemia./      Hypertension    Hyperlipidemia -Continue statin    Hypothyroidism status post thyroidectomy -Continue Synthroid    History of CVA (cerebrovascular accident) -Continue statin and baby aspirin as above -when INR at goal will discontinue aspirin.     Anemia -Current hemoglobin stable and baseline of 10.3    Nonspecific abnormal electrocardiogram (ECG) (EKG)/QT prolongation -Current QTc within normal limits      DVT prophylaxis:lovenox, coumadin  Code Status: full code.  Family Communication: care discussed with patient.  Disposition Plan: remain in patient.    Consultants:   none   Procedures:   Doppler LE; positive for DVT right peroneal, tibial.   Antimicrobials:  none  Subjective: Denies dyspnea.    Objective: Vitals:   09/05/16 0836 09/05/16 1040 09/05/16 1209 09/05/16 1318  BP:  (!) 144/68  (!) 165/149  Pulse: 81  80 82  Resp: 16  16 17   Temp:    98.7 F (37.1 C)  TempSrc:    Oral  SpO2: 97%  98% 92%  Weight:      Height:        Intake/Output Summary (Last 24 hours) at 09/05/16 1354 Last data filed at 09/04/16 1736  Gross per 24 hour  Intake              120 ml  Output              400 ml  Net             -280 ml   Filed Weights   09/03/16 0447 09/04/16 0538 09/05/16 0357  Weight: 76.4 kg (168 lb 6.4 oz) 77.2 kg (170 lb 1.6 oz) 77.2 kg (170 lb 3.2 oz)    Examination:  General exam: Appears calm and comfortable , trach in place.  Respiratory system: Clear to auscultation. Respiratory effort normal. Cardiovascular system: S1 & S2 heard, RRR. No JVD, murmurs, rubs, gallops or clicks. No pedal edema. Gastrointestinal system: Abdomen is nondistended, soft and nontender. No organomegaly or masses felt. Normal bowel sounds heard. Central nervous system: Alert and oriented. No focal neurological deficits. Extremities: Symmetric 5 x 5 power. Skin: No rashes, lesions or ulcers Psychiatry: Judgement and insight appear normal. Mood & affect  appropriate.     Data Reviewed: I have personally reviewed following labs and imaging studies  CBC:  Recent Labs Lab 09/02/16 0346 09/02/16 1139 09/03/16 0229 09/04/16 0135  WBC  --  7.7 7.7 9.4  NEUTROABS  --  5.6  --   --   HGB 10.5* 10.3* 9.5* 9.6*  HCT 31.0* 33.1* 30.6* 30.7*  MCV  --  96.2 95.6 94.8  PLT  --  184 180 200   Basic Metabolic Panel:  Recent Labs Lab 09/02/16 0346 09/02/16 1139 09/02/16 1541 09/03/16 0229 09/04/16 0135 09/05/16 0208  NA 145 143  --  144 138 139  K 4.0 4.3  --  3.8 4.1 4.6  CL 105 108  --  107 101 100*  CO2  --  28  --  29 28 32  GLUCOSE 101* 128*  --  100* 110* 142*  BUN 39* 37*  --  33* 37* 37*  CREATININE 1.50* 1.37*  --  1.40* 1.94* 1.70*  CALCIUM  --  8.6*  --  8.5* 8.7* 8.8*  MG  --   --  2.2  --   --   --   PHOS  --   --  4.4  --   --   --    GFR: Estimated Creatinine Clearance: 29.6 mL/min (by C-G formula based on SCr of 1.7 mg/dL (H)). Liver Function Tests:  Recent Labs Lab 09/03/16 0229  AST 19  ALT 18  ALKPHOS 71  BILITOT 0.6  PROT 5.8*  ALBUMIN 3.1*   No results for input(s): LIPASE, AMYLASE in the last 168 hours. No results for input(s): AMMONIA in the last 168 hours. Coagulation Profile:  Recent Labs Lab 09/02/16 1249 09/03/16 0229 09/04/16 0135 09/05/16 0208  INR 1.07 1.07 1.09 1.32   Cardiac Enzymes:  Recent Labs Lab 09/02/16 0338  CKTOTAL 162   BNP (last 3 results) No results for input(s): PROBNP in the last 8760 hours. HbA1C: No results for input(s): HGBA1C in the last 72 hours. CBG:  Recent Labs Lab 09/04/16 1107 09/04/16 1639 09/04/16 2153 09/05/16 0607 09/05/16 1123  GLUCAP 269* 135* 162* 160* 197*   Lipid Profile: No results for input(s): CHOL, HDL, LDLCALC, TRIG, CHOLHDL, LDLDIRECT in the last 72 hours. Thyroid Function Tests: No results for input(s): TSH, T4TOTAL, FREET4, T3FREE, THYROIDAB in the last 72 hours. Anemia Panel: No results for input(s): VITAMINB12,  FOLATE, FERRITIN, TIBC, IRON, RETICCTPCT in the last 72 hours. Sepsis Labs: No results for input(s): PROCALCITON, LATICACIDVEN in the last 168 hours.  No results found for this or any previous visit (from the past 240 hour(s)).       Radiology Studies: Dg Chest Port 1  View  Result Date: 09/05/2016 CLINICAL DATA:  Hypoxia.  Short of breath EXAM: PORTABLE CHEST 1 VIEW COMPARISON:  09/02/2016 FINDINGS: Tracheostomy in good position and unchanged. Cardiac enlargement. Pulmonary vascular congestion. Mild bilateral airspace disease unchanged most consistent with mild edema. Negative for pleural effusion. IMPRESSION: No significant interval change. Mild airspace disease most consistent with edema. Electronically Signed   By: Marlan Palau M.D.   On: 09/05/2016 12:56        Scheduled Meds: . aspirin EC  81 mg Oral Daily  . budesonide (PULMICORT) nebulizer solution  0.25 mg Nebulization BID  . carvedilol  12.5 mg Oral BID WC  . dicyclomine  20 mg Oral TID AC  . DULoxetine  60 mg Oral QHS  . enoxaparin (LOVENOX) injection  80 mg Subcutaneous Q24H  . ferrous sulfate  325 mg Oral BID WC  . gabapentin  300 mg Oral QHS  . hydrALAZINE  25 mg Oral TID  . insulin aspart  0-5 Units Subcutaneous QHS  . insulin aspart  0-9 Units Subcutaneous TID WC  . ipratropium-albuterol  3 mL Nebulization Q8H  . isosorbide mononitrate  15 mg Oral Daily  . levothyroxine  75 mcg Oral QAC breakfast  . multivitamin with minerals  1 tablet Oral Daily  . pantoprazole  20 mg Oral Daily  . polyethylene glycol  17 g Oral Daily  . simvastatin  40 mg Oral QPM  . tiZANidine  2 mg Oral Daily  . torsemide  40 mg Oral Daily  . warfarin  7.5 mg Oral ONCE-1800  . Warfarin - Pharmacist Dosing Inpatient   Does not apply q1800   Continuous Infusions:   LOS: 3 days    Time spent: 35 minutes.     Alba Cory, MD [facility] Pager [phone]  If 7PM-7AM, please contact  night-coverage [email or web address] Password [email or web address] 09/05/2016, 1:54 PM

## 2016-09-05 NOTE — Progress Notes (Signed)
ANTICOAGULATION CONSULT NOTE  Pharmacy Consult for Lovenox/warfarin Indication: DVT   Assessment: [age] yof presented to the ED with leg pain and found to have an acute DVT. Started warfarin with lovenox bridge - day 4/5 of recommended VTE overlap.  INR up to 1.32 after 3 doses of warfarin. CBC stable overnight with no bleeding issues have been noted. She continues on once daily lovenox as her renal function is borderline.   Noted patient had a fall yesterday but no injuries reported.   Goal of Therapy:  INR 2-3 Monitor platelets by anticoagulation protocol: Yes   Plan:  - Give Warfarin 7.5mg  x1 dose tonight - No change in lovenox today - 80mg  q24 hours - Daily INR/CBC q72 hours - Monitor for s/sx bleeding   Allergies  Allergen Reactions  . Aldactone [Spironolactone] Other (See Comments)    Severe hyperkalemia   . Lisinopril Other (See Comments) and Cough    Hypotension also  . Crestor [Rosuvastatin Calcium] Other (See Comments)    Muscle Pain  . Vicodin [Hydrocodone-Acetaminophen] Nausea And Vomiting    Patient Measurements: Height: 5\' 2"  (157.5 cm) Weight: 170 lb 3.2 oz (77.2 kg) IBW/kg (Calculated) : 50.1  Vital Signs: Temp: 97.5 F (36.4 C) (11/13 0357) Temp Source: Oral (11/13 0357) BP: 143/55 (11/13 0819) Pulse Rate: 81 (11/13 0836)  Labs:  Recent Labs  09/02/16 1139  09/03/16 0229 09/04/16 0135 09/05/16 0208  HGB 10.3*  --  9.5* 9.6*  --   HCT 33.1*  --  30.6* 30.7*  --   PLT 184  --  180 200  --   LABPROT  --   < > 14.0 14.1 16.5*  INR  --   < > 1.07 1.09 1.32  CREATININE 1.37*  --  1.40* 1.94* 1.70*  < > = values in this interval not displayed.  Estimated Creatinine Clearance: 29.6 mL/min (by C-G formula based on SCr of 1.7 mg/dL (H)).   Babs BertinHaley Kambrey Hagger, PharmD, BCPS Clinical Pharmacist 09/05/2016 9:14 AM

## 2016-09-05 NOTE — Clinical Social Work Placement (Signed)
   CLINICAL SOCIAL WORK PLACEMENT  NOTE  Date:  09/05/2016  Patient Details  Name: [name] MRN: [id number] Date of Birth: 09/17/1946  Clinical Social Work is seeking post-discharge placement for this patient at the Skilled  Nursing Facility level of care (*CSW will initial, date and re-position this form in  chart as items are completed):  Yes   Patient/family provided with [facility] Clinical Social Work Department's list of facilities offering this level of care within the geographic area requested by the patient (or if unable, by the patient's family).  Yes   Patient/family informed of their freedom to choose among providers that offer the needed level of care, that participate in Medicare, Medicaid or managed care program needed by the patient, have an available bed and are willing to accept the patient.  Yes   Patient/family informed of [facility]'s ownership interest in [facility] and [facility], as well as of the fact that they are under no obligation to receive care at these facilities.  PASRR submitted to EDS on 09/05/16     PASRR number received on       Existing PASRR number confirmed on 09/05/16     FL2 transmitted to all facilities in geographic area requested by pt/family on 09/05/16     FL2 transmitted to all facilities within larger geographic area on       Patient informed that his/her managed care company has contracts with or will negotiate with certain facilities, including the following:            Patient/family informed of bed offers received.  Patient chooses bed at       Physician recommends and patient chooses bed at      Patient to be transferred to   on  .  Patient to be transferred to facility by       Patient family notified on   of transfer.  Name of family member notified:        PHYSICIAN Please sign FL2     Additional Comment:    _______________________________________________ Margarito LinerSarah C Vash Quezada, LCSW 09/05/2016,  1:17 PM

## 2016-09-05 NOTE — Procedures (Signed)
Tracheostomy Change Note  Patient Details:   Name: Scot JunDorothy I Fludd DOB: 10/31/1945 MRN: [id number]    Airway Documentation:     Evaluation  O2 sats: stable throughout Complications: No apparent complications Patient did tolerate procedure well. Bilateral Breath Sounds: Clear, Diminished  Trach was changed   shiley 6 uncuffed  sats are stable    Abdirahman Chittum, Duane LopeJeffrey D 09/05/2016, 4:42 PM

## 2016-09-05 NOTE — NC FL2 (Signed)
[place] MEDICAID FL2 LEVEL OF CARE SCREENING TOOL     IDENTIFICATION  Patient Name: [name] Birthdate: 12/04/1945 Sex: female Admission Date (Current Location): 09/02/2016  [facility] [place]   Facility and Address:  [facility] [place], [address], [place], [place] [address]      Provider Number: [id number]  Attending Physician Name and Address:  Alba CoryBelkys A Regalado, MD  Relative Name and Phone Number:       Current Level of Care: Hospital Recommended Level of Care: Skilled Nursing Facility Prior Approval Number:    Date Approved/Denied:   PASRR Number: [id number]  Discharge Plan: SNF    Current Diagnoses: Patient Active Problem List   Diagnosis Date Noted  . Acute deep vein thrombosis (DVT) (HCC) 09/02/2016  . Strain and tear of left hip adductor muscle and quadricep muscle 09/02/2016  . History of lower GI bleeding-presumed diverticular 06/28/2016  . Ischemic cardiomyopathy 04/11/2016  . AV block, 1st degree 11/15/2015  . CKD (chronic kidney disease) stage 4, GFR 15-29 ml/min (HCC) 11/10/2015  . Cancer screening 11/04/2015  . COPD, severe (HCC) 11/04/2015  . Chronic respiratory failure with hypoxia (HCC) 11/04/2015  . Status post tracheostomy (HCC) 11/04/2015  . Acute on chronic respiratory failure with hypoxia (HCC) 07/28/2015  . DM type 2 causing CKD stage 3 (HCC) 07/28/2015  . Abnormal EKG 07/27/2015  . Acute respiratory failure with hypoxia (HCC) 07/27/2015  . Chronic combined systolic and diastolic CHF (congestive heart failure) (HCC) 07/27/2015  . Acute pulmonary edema (HCC)   . Abnormal nuclear stress test   . Chronic systolic heart failure (HCC) 07/17/2014  . Chronic airway obstruction, not elsewhere classified 07/17/2014  . Atherosclerosis of native arteries of the extremities with intermittent claudication 01/16/2014  . Aftercare following surgery of the circulatory system, NEC 09/05/2013  . History  of tracheostomy 08/26/2013  . Discoloration of skin- left foot 03/28/2013  . Peripheral vascular disease, unspecified (HCC) 03/28/2013  . Nonspecific abnormal electrocardiogram (ECG) (EKG)/QT prolongation 01/01/2013  . Anemia 10/21/2012  . Occlusion and stenosis of carotid artery without mention of cerebral infarction 02/02/2012  . 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 Diabetes mellitus type 2, uncontrolled (HCC) 11/26/2011  . Hypertension 11/26/2011  . Hyperlipidemia 11/26/2011  . History of CVA (cerebrovascular accident) 11/26/2011  . 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 Peripheral neuropathy (HCC) 11/26/2011  . History of MI (myocardial infarction)   . CAD (coronary artery disease)   . Chronic combined systolic and diastolic CHF, NYHA class 2 (EF 30-35%) per ECHO 2017   . S/P CABG (coronary artery bypass graft)   . PAD (peripheral artery disease) (HCC)   . Hx-TIA (transient ischemic attack) 01/24/2011    Orientation RESPIRATION BLADDER Height & Weight     Self, Time, Situation, Place  Tracheostomy (6 mm uncuffed. Placed 11/10/2015. 10 L/min, FiO2 35%.) Continent Weight: 170 lb 3.2 oz (77.2 kg) Height:  5\' 2"  (157.5 cm)  BEHAVIORAL SYMPTOMS/MOOD NEUROLOGICAL BOWEL NUTRITION STATUS   (None)  (None) Continent Diet (Heart healthy/carb modified)  AMBULATORY STATUS COMMUNICATION OF NEEDS Skin   Extensive Assist Verbally Normal                       Personal Care Assistance Level of Assistance  Bathing, Feeding, Dressing Bathing Assistance: Maximum assistance Feeding assistance: Limited assistance Dressing Assistance: Maximum assistance     Functional Limitations Info  Sight, Hearing, Speech Sight Info: Adequate Hearing Info:  Adequate Speech Info: Adequate    SPECIAL CARE FACTORS FREQUENCY  PT (By licensed PT), Blood pressure, Diabetic urine testing, OT (By licensed OT)     PT Frequency: 5 x week OT Frequency: 5 x week             Contractures Contractures Info: Not present    Additional Factors Info  Code Status, Allergies Code Status Info: Full Allergies Info: Aldactone (Spiranolactone), Lisinopril, Crestor Rosuvastatin Calcium, Vicodin Hydrocodone-acetaminophen           Current Medications (09/05/2016):  This is the current hospital active medication list Current Facility-Administered Medications  Medication Dose Route Frequency Provider Last Rate Last Dose  . traMADol (ULTRAM) tablet 50 mg  50 mg Oral Q6H PRN Russella DarAllison L Ellis, NP   50 mg at 09/04/16 2212   And  . acetaminophen (TYLENOL) tablet 650 mg  650 mg Oral Q4H PRN Russella DarAllison L Ellis, NP      . albuterol (PROVENTIL) (2.5 MG/3ML) 0.083% nebulizer solution 2.5 mg  2.5 mg Nebulization Q2H PRN Haydee SalterPhillip M Hobbs, MD      . aspirin EC tablet 81 mg  81 mg Oral Daily Russella DarAllison L Ellis, NP   81 mg at 09/05/16 1043  . budesonide (PULMICORT) nebulizer solution 0.25 mg  0.25 mg Nebulization BID Duayne CalPaul W Hoffman, NP      . carvedilol (COREG) tablet 12.5 mg  12.5 mg Oral BID WC Russella DarAllison L Ellis, NP   12.5 mg at 09/05/16 0819  . dicyclomine (BENTYL) tablet 20 mg  20 mg Oral TID AC Russella DarAllison L Ellis, NP   20 mg at 09/05/16 1202  . DULoxetine (CYMBALTA) DR capsule 60 mg  60 mg Oral QHS Russella DarAllison L Ellis, NP   60 mg at 09/04/16 2203  . enoxaparin (LOVENOX) injection 80 mg  80 mg Subcutaneous Q24H Laurence Spatesachel Morgan Little, MD   80 mg at 09/05/16 1202  . ferrous sulfate tablet 325 mg  325 mg Oral BID WC Belkys A Regalado, MD   325 mg at 09/05/16 0819  . gabapentin (NEURONTIN) capsule 300 mg  300 mg Oral QHS Russella DarAllison L Ellis, NP   300 mg at 09/04/16 2203  . hydrALAZINE (APRESOLINE) tablet 25 mg  25 mg Oral TID Russella DarAllison L Ellis, NP   25 mg at 09/05/16 1043  . HYDROcodone-acetaminophen (NORCO/VICODIN) 5-325 MG per tablet 1 tablet  1 tablet Oral Q4H PRN Russella DarAllison L Ellis, NP   1 tablet at 09/04/16 1807  . insulin aspart (novoLOG) injection 0-5 Units  0-5 Units Subcutaneous QHS Russella DarAllison L Ellis,  NP      . insulin aspart (novoLOG) injection 0-9 Units  0-9 Units Subcutaneous TID WC Russella DarAllison L Ellis, NP   2 Units at 09/05/16 1203  . ipratropium-albuterol (DUONEB) 0.5-2.5 (3) MG/3ML nebulizer solution 3 mL  3 mL Nebulization Q8H Duayne CalPaul W Hoffman, NP      . isosorbide mononitrate (IMDUR) 24 hr tablet 15 mg  15 mg Oral Daily Russella DarAllison L Ellis, NP   15 mg at 09/05/16 1042  . levothyroxine (SYNTHROID, LEVOTHROID) tablet 75 mcg  75 mcg Oral QAC breakfast Russella DarAllison L Ellis, NP   75 mcg at 09/05/16 [id number]  . multivitamin with minerals tablet 1 tablet  1 tablet Oral Daily Russella DarAllison L Ellis, NP   1 tablet at 09/05/16 1042  . pantoprazole (PROTONIX) EC tablet 20 mg  20 mg Oral Daily Russella DarAllison L Ellis, NP   20 mg at 09/05/16 1043  . polyethylene glycol (MIRALAX /  GLYCOLAX) packet 17 g  17 g Oral Daily Belkys A Regalado, MD   17 g at 09/05/16 1040  . senna-docusate (Senokot-S) tablet 2 tablet  2 tablet Oral QHS PRN Russella Dar, NP      . simvastatin (ZOCOR) tablet 40 mg  40 mg Oral QPM Russella Dar, NP   40 mg at 09/04/16 1801  . tiZANidine (ZANAFLEX) tablet 2 mg  2 mg Oral Daily Russella Dar, NP   2 mg at 09/05/16 1042  . torsemide (DEMADEX) tablet 40 mg  40 mg Oral Daily Belkys A Regalado, MD   40 mg at 09/05/16 1202  . warfarin (COUMADIN) tablet 7.5 mg  7.5 mg Oral ONCE-1800 Almon Hercules, [facility]      . Warfarin - Pharmacist Dosing Inpatient   Does not apply q1800 Laurence Spates, MD         Discharge Medications: Please see discharge summary for a list of discharge medications.  Relevant Imaging Results:  Relevant Lab Results:   Additional Information SS#: [id number]  Margarito Liner, LCSW

## 2016-09-05 NOTE — Clinical Social Work Note (Signed)
Clinical Social Work Assessment  Patient Details  Name: [name] MRN: [id number] Date of Birth: [birth year]  Date of referral:  09/05/16               Reason for consult:  Facility Placement, Discharge Planning                Permission sought to share information with:  Facility Sport and exercise psychologist, Family Supports Permission granted to share information::  Yes, Verbal Permission Granted  Name::     Biochemist, clinical  Agency::  SNF's  Relationship::  Daughter  Contact Information:  [phone]  Housing/Transportation Living arrangements for the past 2 months:  Single Family Home Source of Information:  Patient, Medical Team Patient Interpreter Needed:  None Criminal Activity/Legal Involvement Pertinent to Current Situation/Hospitalization:  No - Comment as needed Significant Relationships:  Adult Children Lives with:  Adult Children Do you feel safe going back to the place where you live?  Yes Need for family participation in patient care:  Yes (Comment)  Care giving concerns:  PT recommending SNF once medically stable for discharge, likely tomorrow.   Social Worker assessment / plan:  CSW met with patient. No supports at bedside. CSW introduced role and explained that PT recommendations would be discussed. Patient wants to speak with her daughter before deciding on SNF but gave permission to go ahead and fax her out. No further concerns. CSW encouraged patient to contact CSW as needed. CSW will continue to follow patient for support and facilitate discharge to SNF once medically stable, if agreeable.  Employment status:  Retired Forensic scientist:  Medicare PT Recommendations:  [place] / Referral to community resources:  [place]  Patient/Family's Response to care:  Patient wants to speak with her daughter before deciding on SNF. Patient's daughter supportive and involved in patient's care. Patient appreciated social work  intervention.  Patient/Family's Understanding of and Emotional Response to Diagnosis, Current Treatment, and Prognosis:  Patient understands the need for rehab at this time. Patient appears happy with hospital care.  Emotional Assessment Appearance:  Appears stated age Attitude/Demeanor/Rapport:  Other (Pleasant) Affect (typically observed):  Appropriate, Calm, Pleasant Orientation:  Oriented to Self, Oriented to Place, Oriented to  Time, Oriented to Situation Alcohol / Substance use:  Never Used Psych involvement (Current and /or in the community):  No (Comment)  Discharge Needs  Concerns to be addressed:  Care Coordination Readmission within the last 30 days:  No Current discharge risk:  Dependent with Mobility Barriers to Discharge:  No Barriers Identified   Candie Chroman, LCSW 09/05/2016, 1:15 PM

## 2016-09-05 NOTE — Consult Note (Signed)
Name: [name] MRN: [id number] DOB: 09/14/1946    ADMISSION DATE:  09/02/2016 CONSULTATION DATE:  09/05/2016  REFERRING MD :  Dr. 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 Admitted 11/10 with calf pain found to be DVT. CT chest also demonstrated debris at tracheal tube tip. Increasing O2 demands. PCCM consulted 11/13.  SIGNIFICANT EVENTS  11/10 admit  STUDIES:  Doppler > Findings consistent with acute deep vein thrombosis involving the right posterial tibial vein and right peroneal vein. CTA chest  >  Bilateral interlobular septal thickening with developing nodular/ground-glass opacity of the left greater than right lungs, most compatible with edema, however, atypical infection could have this appearance. Debris at the tip of tracheostomy tube, potentially mucous/secretions. Cardiomegaly. Surgical changes of prior median sternotomy and CABG. Associated aortic atherosclerosis.  HISTORY OF PRESENT ILLNESS: 70 year old female with PMH as below, which is significant for CHF (LVEF 35%), COPD (described as severe, but no PFT. On 4L at night and with activity, only using PRN nebs. Dr,. Marchelle GearingRamaswamy has prescribed her pulmicort, but this is not on her meds list and she does not use), tracheostomy status (Dr. 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  She was recently admitted for GI bleeding requiring 2 units PRBC transfusion.   11/10 she presented to ED with calf pain and SOB. She was found to have DVT and pulmonary edema. She was treated with diuresis and felt better. Plan was to start on lovenox and warfarin and discharge, but based on recent admit for GI bleed it was decided to anticoagulate her as inpatient. CTA of the chest to evaluate for PE did not find PE, but did describe some pulmonary edema and debris on the distal end of her  tracheostomy. 11/11 a large amount of secretions were pulled from inner cannula. Since that time oxygen demands have been increasing. SHe does not feel SOB, although she does have cough, which is chronic. Upon my examination she was on 30% FiO2 via trach and sats 100%.  PAST MEDICAL HISTORY :   has a past medical history of Anemia; Arthritis; BRBPR (bright red blood per rectum) (06/28/2016); CAD (coronary artery disease); Carotid artery disease (HCC); Chronic combined systolic and diastolic CHF (congestive heart failure) (HCC); Chronic lower back pain; CKD (chronic kidney disease), stage III; COPD (chronic obstructive pulmonary disease) (HCC); DDD (degenerative disc disease), lumbar; Depression; GERD (gastroesophageal reflux disease); History of blood transfusion (~ 2015; 06/28/2016); History of IBS; History of tracheostomy (08/26/2013); Hyperlipidemia; Hypertension; Hypothyroidism; MI (myocardial infarction) (1997); On home oxygen therapy; PAD (peripheral artery disease) (HCC); Stroke [facility](HCC); and Type II diabetes mellitus (HCC).  has a past surgical history that includes Mitral valve replacement; Thyroidectomy; Carotid endarterectomy (Left, ~2008); Tracheostomy tube placement (01/02/2012); Angioplasty (4098-1191(0815-2012); pr vein bypass graft,aorto-fem-pop; pr vein bypass graft,aorto-fem-pop; Carpal tunnel release (Left); Femoral-tibial Bypass Graft (Left, 06/11/2013); Thrombectomy femoral artery (Left, 06/11/2013); Spine surgery (Oct. 27, 2014); abdominal aortagram (N/A, 04/05/2013); Colonoscopy (N/A, 10/27/2014); left and right heart catheterization with coronary angiogram (N/A, 01/09/2015); Cholecystectomy open; Cataract extraction w/ intraocular lens implant & anterior vitrectomy, bilateral (~2007-02/2016); Coronary artery bypass graft; Dilation and curettage of uterus; and Tubal ligation. Prior to Admission medications   Medication Sig Start Date End Date Taking? Authorizing Provider  acetaminophen (TYLENOL) 500 MG  tablet Take 1,000 mg by mouth daily as needed for mild pain or headache.  Yes Historical Provider, MD  aspirin EC 81 MG tablet Take 81 mg by mouth daily.   Yes Historical Provider, MD  carvedilol (COREG) 12.5 MG tablet Take 1 tablet (12.5 mg total) by mouth 2 (two) times daily with a meal. 03/29/16  Yes Jake BatheMark C Skains, MD  dicyclomine (BENTYL) 20 MG tablet Take 20 mg by mouth 3 (three) times daily before meals.   Yes Historical Provider, MD  DULoxetine (CYMBALTA) 60 MG capsule Take 60 mg by mouth at bedtime.  08/06/14  Yes Historical Provider, MD  ferrous sulfate 325 (65 FE) MG tablet Take 325 mg by mouth daily with breakfast.   Yes Historical Provider, MD  gabapentin (NEURONTIN) 300 MG capsule Take 1 capsule (300 mg total) by mouth at bedtime. 03/29/16  Yes Richard C Tuchman, DPM  glucose ([facility] GLUCOSE) 4 GM chewable tablet Chew 1 tablet by mouth daily as needed for low blood sugar.    Yes Historical Provider, MD  hydrALAZINE (APRESOLINE) 25 MG tablet Take 1 tablet (25 mg total) by mouth 3 (three) times daily. 04/11/16  Yes Scott Moishe Spice Weaver, PA-C  HYDROcodone-acetaminophen (NORCO) 10-325 MG tablet Take 0.5-1 tablets by mouth every 6 (six) hours as needed for moderate pain.  05/12/16  Yes Historical Provider, MD  Insulin Degludec (TRESIBA FLEXTOUCH) 100 UNIT/ML SOPN Inject 24 Units into the skin at bedtime.    Yes Historical Provider, MD  ipratropium-albuterol (DUONEB) 0.5-2.5 (3) MG/3ML SOLN Take 3 mLs by nebulization 2 (two) times daily as needed (for shortnes of breath).    Yes Historical Provider, MD  isosorbide mononitrate (IMDUR) 30 MG 24 hr tablet Take 15 mg by mouth daily.   Yes Historical Provider, MD  lansoprazole (PREVACID) 30 MG capsule Take 30 mg by mouth 2 (two) times daily. 06/08/16  Yes Historical Provider, MD  levothyroxine (SYNTHROID, LEVOTHROID) 75 MCG tablet Take 75 mcg by mouth daily before breakfast.  06/12/16  Yes Historical Provider, MD  Multiple Vitamins-Minerals (WOMENS 50+ MULTI  VITAMIN/MIN) TABS Take 1 tablet by mouth daily.   Yes Historical Provider, MD  nitroGLYCERIN (NITROSTAT) 0.4 MG SL tablet Place 0.4 mg under the tongue every 5 (five) minutes as needed for chest pain (x 3 doses). For chest pain   Yes Historical Provider, MD  NOVOLOG FLEXPEN 100 UNIT/ML FlexPen 70 -199 TAKE 12 UNITS, 200-299 TAKE 14 UNITS, 300 OR HIGHER 16 UNITS SUBCUTANEOUS (12-16 units subcutaneously three (3) times a day) 10/01/15  Yes Historical Provider, MD  OXYGEN-HELIUM IN Inhale 4 L into the lungs at bedtime.    Yes Historical Provider, MD  polyethylene glycol (MIRALAX / GLYCOLAX) packet Take 17 g by mouth daily. Patient taking differently: Take 17 g by mouth daily as needed for mild constipation.  07/04/16  Yes Albertine GratesFang Xu, MD  Probiotic Product (PROBIOTIC PO) Take 1 tablet by mouth daily.    Yes Historical Provider, MD  senna-docusate (SENOKOT-S) 8.6-50 MG tablet Take 2 tablets by mouth at bedtime. Patient taking differently: Take 2 tablets by mouth at bedtime as needed for mild constipation.  07/04/16  Yes Albertine GratesFang Xu, MD  simvastatin (ZOCOR) 40 MG tablet Take 40 mg by mouth every evening.  11/28/11  Yes Danley DankerBradley W Wainright, MD  tiZANidine (ZANAFLEX) 2 MG tablet Take 2 mg by mouth daily. 06/23/16  Yes Historical Provider, MD  torsemide (DEMADEX) 20 MG tablet Take 40 mg by mouth daily.    Yes Historical Provider, MD  oxyCODONE-acetaminophen (PERCOCET/ROXICET) 5-325 MG tablet Take 1-2 tablets by mouth every 6 (six)  hours as needed. 09/02/16   Kristen N Ward, DO   Allergies  Allergen Reactions  . Aldactone [Spironolactone] Other (See Comments)    Severe hyperkalemia   . Lisinopril Other (See Comments) and Cough    Hypotension also  . Crestor [Rosuvastatin Calcium] Other (See Comments)    Muscle Pain  . Vicodin [Hydrocodone-Acetaminophen] Nausea And Vomiting    FAMILY HISTORY:  family history includes Alzheimer's disease in her mother; Diabetes in her daughter; Heart disease in her mother;  Hyperlipidemia in her mother; Hypertension in her daughter; Irregular heart beat in her mother; Other in her mother. SOCIAL HISTORY:  reports that she quit smoking about 4 years ago. Her smoking use included Cigarettes. She has a 50.00 pack-year smoking history. She has never used smokeless tobacco. She reports that she does not drink alcohol or use drugs.  REVIEW OF SYSTEMS:    Bolds are positive  Constitutional: weight loss, gain, night sweats, Fevers, chills, fatigue .  HEENT: headaches, Sore throat, sneezing, nasal congestion, post nasal drip, Difficulty swallowing, Tooth/dental problems, visual complaints visual changes, ear ache CV:  chest pain, radiates: ,Orthopnea, PND, swelling in lower extremities, dizziness, palpitations, syncope.  GI  heartburn, indigestion, abdominal pain, nausea, vomiting, diarrhea, change in bowel habits, loss of appetite, bloody stools.  Resp: cough, productive: , hemoptysis, dyspnea, chest pain, pleuritic.  Skin: rash or itching or icterus GU: dysuria, change in color of urine, urgency or frequency. flank pain, hematuria  MS: calf pain or swelling. decreased range of motion  Psych: change in mood or affect. depression or anxiety.  Neuro: difficulty with speech, weakness, numbness, ataxia    SUBJECTIVE:   VITAL SIGNS: Temp:  [97.5 F (36.4 C)-98.2 F (36.8 C)] 97.5 F (36.4 C) (11/13 0357) Pulse Rate:  [64-84] 81 (11/13 0836) Resp:  [14-20] 16 (11/13 0836) BP: (132-144)/(43-68) 144/68 (11/13 1040) SpO2:  [92 %-100 %] 97 % (11/13 0836) FiO2 (%):  [28 %-35 %] 35 % (11/13 0836) Weight:  [77.2 kg (170 lb 3.2 oz)] 77.2 kg (170 lb 3.2 oz) (11/13 0357)  PHYSICAL EXAMINATION: General:  Obese female with trach in NAD Neuro:  Alert, oriented, non-focal HEENT:  [place]/AT, PERRL, no JVD Cardiovascular:  RRR, no MRG Lungs:  Some R basilar rales, otherwise clear with good air movement. Abdomen:  Soft, non-tender, non-distended Musculoskeletal:  No acute deformity  or ROM limitation Skin:  Grossly intact   Recent Labs Lab 09/03/16 0229 09/04/16 0135 09/05/16 0208  NA 144 138 139  K 3.8 4.1 4.6  CL 107 101 100*  CO2 29 28 32  BUN 33* 37* 37*  CREATININE 1.40* 1.94* 1.70*  GLUCOSE 100* 110* 142*    Recent Labs Lab 09/02/16 1139 09/03/16 0229 09/04/16 0135  HGB 10.3* 9.5* 9.6*  HCT 33.1* 30.6* 30.7*  WBC 7.7 7.7 9.4  PLT 184 180 200   No results found.  ASSESSMENT / PLAN:  Acute on chronic hypoxemic respiratory failure: likely multifactorial in the setting of known COPD on home O2 at night and with activity, CHF with edema on presentation, and DVT and possibly PE although not described on CT scan 11/10. She is having no respiratory distress. Sats were 100% on 30% FiO2 and 28% FiO2. I turned her down to room air and sats were still in 90s. Upon ambulating to the bathroom and coming back to bed sats are now fluctuating between 83 and 94% on bedside pulse oximeter. Not sure how far from her baseline this is, as she is  prescribed O2 with activity. Also may be a measurement issue as it is really fluctuating rapidly.   Also debris described on tracheostomy, suspect this was removed with suctioning as she has very smooth air entry on auscultation of her neck.   Plan: Supplemental O2 titrated O2 sat goal 90-95% Nocturnal O2 4L Start scheduled duonebs and Pulmicort as recommended by Dr. Marchelle Gearing at time of last OV, continue at discharge.  CXR today to evaluate effectiveness of diuresis May need to diurese further If this is PE, current DVT treatment is adequate, no need to repeat CT unless worsens.  Will arrange for her to have trach changed, she has missed her appointment due to hospitalizations.  Please arrange, or ask [place] to arrange, pulmonary follow up at time of discharge.  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    Joneen Roach, AGACNP-BC [place] [facility] Pager  [phone] or [phone]  09/05/2016 11:44 AM   Attending Note:  I have examined patient, reviewed labs, studies and notes. I have discussed the case with Henreitta Leber, and I agree with the data and plans as amended above.   Multifactorial hypoxemic resp failure due to CHF, COPD. 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 She was admitted with a DVT and hypoxemia. Started on anticoagulation. On CT chest she had some debris noted in the trachea at the distal end of trach tube. Inner canula was removed with secretions evident on the tip. PCCM consulted regarding this CT scan abnormality. I suspect that the material noted on the inner canula was what was seen on the CT chest. All the same, she is overdue for a trach change (usually done by Dr Jenne Pane) so we will change her to an identical #6 cuffless trach before d/c to home.   Please call if we can assist further.   Levy Pupa, MD, PhD 09/05/2016, 3:59 PM [place] [phone] or if no answer [phone]

## 2016-09-05 NOTE — Evaluation (Signed)
Occupational Therapy Evaluation Patient Details Name: [name] MRN: [id number] DOB: 09/19/1946 Today's Date: 09/05/2016    History of Present Illness pt presents with R LE DVT and found to have R 5th phalanx subacute fx.  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     Clinical Impression   Pt with decline in function and safety with ADLs and ADL mobility with decreased strength, balance and endurance. Pt is limited by pain in L LE from DVT. Pt would benefit from acute OT services to address impairments to increase level of function and safety    Follow Up Recommendations  SNF    Equipment Recommendations  Other (comment) (TBD at next venue of care)    Recommendations for Other Services       Precautions / Restrictions Precautions Precautions: Fall Precaution Comments: Chronic Trach Restrictions Weight Bearing Restrictions: No Other Position/Activity Restrictions: Per notes pt to f/u as outpatient for subacute R 5th phalanx fx.        Mobility Bed Mobility               General bed mobility comments: pt sitting EOB upon entering room  Transfers Overall transfer level: Needs assistance Equipment used: Rolling walker (2 wheeled) Transfers: Sit to/from UGI CorporationStand;Stand Pivot Transfers Sit to Stand: Mod assist Stand pivot transfers: Min assist;Mod assist       General transfer comment: cues for UE use and completing pivot prior to sitting in recliner as pt tends to start sitting when only half way to recliner.  pt only able to minimally bear weight on R foot due to pain.      Balance Overall balance assessment: Needs assistance   Sitting balance-Leahy Scale: Good       Standing balance-Leahy Scale: Poor                              ADL Overall ADL's : Needs assistance/impaired     Grooming: Supervision/safety;Set up;Sitting   Upper Body Bathing:  Supervision/ safety;Set up;Sitting   Lower Body Bathing: Moderate assistance;Maximal assistance   Upper Body Dressing : Set up;Supervision/safety;Sitting   Lower Body Dressing: Maximal assistance Lower Body Dressing Details (indicate cue type and reason): difficulty donning L sock and unable to donn R sock, Poor standing balance Toilet Transfer: Maximal assistance;Moderate assistance;BSC;Stand-pivot;RW   Toileting- Clothing Manipulation and Hygiene: Maximal assistance               Vision  wears glasses, no change from baseline              Pertinent Vitals/Pain Pain Assessment: 0-10 Pain Score: 6  Pain Location: R LE when standing/transfers Pain Descriptors / Indicators: Sore;Aching;Grimacing;Guarding;Throbbing Pain Intervention(s): Limited activity within patient's tolerance;Monitored during session;Repositioned     Hand Dominance Right   Extremity/Trunk Assessment Upper Extremity Assessment Upper Extremity Assessment: Generalized weakness       Cervical / Trunk Assessment Cervical / Trunk Assessment: Kyphotic   Communication Communication Communication: No difficulties   Cognition Arousal/Alertness: Awake/alert Behavior During Therapy: WFL for tasks assessed/performed Overall Cognitive Status: Within Functional Limits for tasks assessed                     General Comments   pt pleasant and cooperative                 Home  Living Family/patient expects to be discharged to:: Private residence Living Arrangements: Children Available Help at Discharge: Family;Available PRN/intermittently Type of Home: House Home Access: Level entry     Home Layout: One level     Bathroom Shower/Tub: Tub/shower unit;Curtain   Bathroom Toilet: Standard Bathroom Accessibility: Yes   Home Equipment: Environmental consultantWalker - 2 wheels;Cane - single point;Bedside commode;Shower seat;Hand held shower head;Adaptive equipment Adaptive Equipment: Reacher        Prior  Functioning/Environment Level of Independence: Independent with assistive device(s)    ADL's / Homemaking Assistance Needed: was independent PTA   Comments: Uses cane or RW depending on how she feels.          OT Problem List: Decreased strength;Pain;Impaired balance (sitting and/or standing);Decreased activity tolerance;Decreased knowledge of use of DME or AE   OT Treatment/Interventions: Self-care/ADL training;DME and/or AE instruction;Therapeutic activities;Patient/family education    OT Goals(Current goals can be found in the care plan section) Acute Rehab OT Goals Patient Stated Goal: Walk better again OT Goal Formulation: With patient Time For Goal Achievement: 09/12/16 Potential to Achieve Goals: Good ADL Goals Pt Will Perform Grooming: with min assist;standing Pt Will Perform Lower Body Bathing: with mod assist;sitting/lateral leans;sit to/from stand Pt Will Perform Lower Body Dressing: with mod assist;sitting/lateral leans;sit to/from stand Pt Will Transfer to Toilet: with min assist;with min guard assist;bedside commode Pt Will Perform Toileting - Clothing Manipulation and hygiene: with mod assist;sitting/lateral leans;sit to/from stand  OT Frequency: Min 2X/week   Barriers to D/C: Decreased caregiver support                        End of Session Equipment Utilized During Treatment: Gait belt;Rolling walker;Other (comment) (BSC)  Activity Tolerance: Patient limited by pain Patient left: in bed;with call bell/phone within reach (sitting EOB)   Time: [id number] OT Time Calculation (min): 19 min Charges:  OT General Charges $OT Visit: 1 Procedure OT Evaluation $OT Eval Moderate Complexity: 1 Procedure G-Codes:    Galen ManilaSpencer, Jalaysia Lobb Jeanette 09/05/2016, 12:25 PM

## 2016-09-06 ENCOUNTER — Inpatient Hospital Stay (HOSPITAL_COMMUNITY): Payer: Medicare Other

## 2016-09-06 DIAGNOSIS — I824Z1 Acute embolism and thrombosis of unspecified deep veins of right distal lower extremity: Secondary | ICD-10-CM

## 2016-09-06 LAB — BLOOD GAS, ARTERIAL
Acid-Base Excess: 6.2 mmol/L — ABNORMAL HIGH (ref 0.0–2.0)
BICARBONATE: 30.7 mmol/L — AB (ref 20.0–28.0)
Drawn by: 274071
FIO2: 28
O2 Saturation: 87.2 %
PH ART: 7.419 (ref 7.350–7.450)
PO2 ART: 57.1 mmHg — AB (ref 83.0–108.0)
Patient temperature: 98.6
pCO2 arterial: 48.3 mmHg — ABNORMAL HIGH (ref 32.0–48.0)

## 2016-09-06 LAB — CBC
HEMATOCRIT: 33.1 % — AB (ref 36.0–46.0)
Hemoglobin: 10.6 g/dL — ABNORMAL LOW (ref 12.0–15.0)
MCH: 30 pg (ref 26.0–34.0)
MCHC: 32 g/dL (ref 30.0–36.0)
MCV: 93.8 fL (ref 78.0–100.0)
Platelets: 212 10*3/uL (ref 150–400)
RBC: 3.53 MIL/uL — ABNORMAL LOW (ref 3.87–5.11)
RDW: 16.9 % — AB (ref 11.5–15.5)
WBC: 9.2 10*3/uL (ref 4.0–10.5)

## 2016-09-06 LAB — BASIC METABOLIC PANEL
Anion gap: 9 (ref 5–15)
BUN: 35 mg/dL — AB (ref 6–20)
CHLORIDE: 100 mmol/L — AB (ref 101–111)
CO2: 28 mmol/L (ref 22–32)
Calcium: 8.8 mg/dL — ABNORMAL LOW (ref 8.9–10.3)
Creatinine, Ser: 1.46 mg/dL — ABNORMAL HIGH (ref 0.44–1.00)
GFR calc Af Amer: 41 mL/min — ABNORMAL LOW (ref >60)
GFR calc non Af Amer: 35 mL/min — ABNORMAL LOW (ref >60)
GLUCOSE: 203 mg/dL — AB (ref 65–99)
POTASSIUM: 4.4 mmol/L (ref 3.5–5.1)
Sodium: 137 mmol/L (ref 135–145)

## 2016-09-06 LAB — GLUCOSE, CAPILLARY
GLUCOSE-CAPILLARY: 217 mg/dL — AB (ref 65–99)
GLUCOSE-CAPILLARY: 258 mg/dL — AB (ref 65–99)
Glucose-Capillary: 220 mg/dL — ABNORMAL HIGH (ref 65–99)
Glucose-Capillary: 123 mg/dL — ABNORMAL HIGH (ref 65–99)
Glucose-Capillary: 260 mg/dL — ABNORMAL HIGH (ref 65–99)

## 2016-09-06 LAB — AMMONIA: Ammonia: 14 umol/L (ref 9–35)

## 2016-09-06 LAB — PROTIME-INR
INR: 1.67
Prothrombin Time: 19.9 seconds — ABNORMAL HIGH (ref 11.4–15.2)

## 2016-09-06 MED ORDER — HALOPERIDOL LACTATE 5 MG/ML IJ SOLN
0.5000 mg | Freq: Four times a day (QID) | INTRAMUSCULAR | Status: DC | PRN
  Administered 2016-09-07: 0.5 mg via INTRAVENOUS
  Filled 2016-09-06: qty 1

## 2016-09-06 MED ORDER — WARFARIN SODIUM 7.5 MG PO TABS
7.5000 mg | ORAL_TABLET | Freq: Once | ORAL | Status: AC
  Administered 2016-09-06: 7.5 mg via ORAL
  Filled 2016-09-06: qty 1

## 2016-09-06 NOTE — Progress Notes (Signed)
Pt. being taken to MRI, placed on transport oxygen set up at 28%/3L via full tank, placed b/u trach/obturatory taped to head of bed with RT phone #, AMBU with mask taken with pt. RN aware, RT to monitor.

## 2016-09-06 NOTE — Progress Notes (Signed)
PT Cancellation Note  Patient Details Name: [name] MRN: [id number] DOB: 06/16/1946   Cancelled Treatment:    Reason Eval/Treat Not Completed: Fatigue/lethargy limiting ability to participate; patient encouraged to participate despite 8/10 hip pain and fatigue, but stated, "I'll do it tomorrow."  Will attempt to see then.    Elray McgregorCynthia Savreen Gebhardt 09/06/2016, 5:00 PM  [facility]on Centeryndi Meghan Tiemann, [place] CarolinaPT [phone] 09/06/2016

## 2016-09-06 NOTE — Progress Notes (Signed)
ANTICOAGULATION CONSULT NOTE  Pharmacy Consult for Lovenox/warfarin Indication: DVT   Assessment: [age] yof presented to the ED with leg pain and found to have an acute DVT. Started warfarin with lovenox bridge - day 5/5 of recommended VTE overlap, BUT need INR > 2 x 24 hrs before stopping Lovenox.  INR up to 1.67 after 4 doses of warfarin. CBC stable overnight with no bleeding issues have been noted. She continues on once daily lovenox as her renal function is borderline.   Noted patient had a fall yesterday but no injuries reported.   Goal of Therapy:  INR 2-3 Monitor platelets by anticoagulation protocol: Yes   Plan:  - Give Warfarin 7.5mg  x1 dose tonight - No change in lovenox today - 80mg  q24 hours - Daily INR/CBC q72 hours - Monitor for s/sx bleeding   Allergies  Allergen Reactions  . Aldactone [Spironolactone] Other (See Comments)    Severe hyperkalemia   . Lisinopril Other (See Comments) and Cough    Hypotension also  . Crestor [Rosuvastatin Calcium] Other (See Comments)    Muscle Pain  . Vicodin [Hydrocodone-Acetaminophen] Nausea And Vomiting    Patient Measurements: Height: 5\' 2"  (157.5 cm) Weight: 168 lb 6.9 oz (76.4 kg) IBW/kg (Calculated) : 50.1  Vital Signs: Temp: 98.6 F (37 C) (11/14 0405) Temp Source: Oral (11/14 0405) BP: 143/58 (11/14 0752) Pulse Rate: 88 (11/14 0910)  Labs:  Recent Labs  09/04/16 0135 09/05/16 0208 09/06/16 0253 09/06/16 0740  HGB 9.6*  --   --  10.6*  HCT 30.7*  --   --  33.1*  PLT 200  --   --  212  LABPROT 14.1 16.5* 19.9*  --   INR 1.09 1.32 1.67  --   CREATININE 1.94* 1.70*  --  1.46*    Estimated Creatinine Clearance: 34.3 mL/min (by C-G formula based on SCr of 1.46 mg/dL (H)).   Tad MooreJessica Brittaney Beaulieu, Pharm D, BCPS  Clinical Pharmacist Pager [phone]  09/06/2016 11:28 AM

## 2016-09-06 NOTE — Progress Notes (Signed)
PROGRESS NOTE    Becky CARLIN  [name]:[id number] DOB: [birth year] DOA: 09/02/2016 PCP: Becky Bradford, MD   Brief Narrative: [name] is a 70 y.o. female with medical history significant for chronic tracheostomy for tracheal stenosis in setting of O2 dependent severe COPD, diabetes, chronic combined diastolic and systolic heart failure with an EF of 30-35% , hypertension, chronic anemia, history of prior CVA, dyslipidemia,. She was last hospitalized in early September 2017 with lower GI bleeding presumably related to diverticular etiology. She required 2 units PRBCs during that admission. Symptoms were felt related to her antiplatelet agents of Plavix was held and scheduled to be resumed on 9/12. At follow-up cardiology visit on 10/11 patient's cardiologist opted to stop the Plavix given her history of GI bleeding but continued her aspirin. From a cardiology standpoint patient has not had any chest pain or shortness of breath. Patient and family report that at some point in September while attempting to board a [employer] bus there was an issue with the chemical step not being completely deployed and while the patient was attempting to step on this device the driver apparently was able to deploy the step therefore it dropped rapidly and the patient fell injuring her left leg. Since that time she has had significant pain in the groin and hip region and has been undergoing home PT and has been taking Norco without any improvement of her symptoms. She was referred to Dr. Farris Has. An MRI of the left hip was obtained on 11/4. She has not yet followed up with Dr. Farris Has to receive this report but I was able to pull up the MRI and discuss the results with the patient and family. The MRI revealed moderate to advanced bilateral hip arthritic changes with joint space narrowing and osteophytic spurring as well as muscle tears or strains involving the abductor muscles and the proximal quadriceps musculature of the left  leg. For the past several days the patient has had pain in her right calf and presented to the emergency department for further evaluation. Lower extremity venous duplex has revealed an acute DVT involving the right posterior tibial and peroneal veins. EDP did discuss with vascular services and given patient's recent immobility and deconditioning full anti-coagulation was recommended. Initial plan was to have the patient go home on warfarin with Lovenox bridging but after further chart investigation it was discovered the patient had a lower GI bleeding admission with requirement for transfusion back in September and therefore it was best felt to initiate anticoagulation in the hospital setting.  Patient admitted with acute on chronic hypoxic respiratory failure, and right lower extremity DVT. She was started on Lovenox and coumadin for DVT. Unable to use NOAC due to renal failure.  Hospital course complicated with confusion, hallucination. ABG was obtain and was negative for hypercapnia, CT head negative. Will proceed with MRI brain, will also check ammonia level.     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   Subglottic stenosis w/chronic tracheostomy   Anemia   Nonspecific abnormal electrocardiogram (ECG) (EKG)/QT prolongation   Acute on chronic respiratory failure with hypoxia (HCC)   COPD, severe (HCC)   CKD (  chronic kidney disease) stage 4, GFR 15-29 ml/min (HCC)   History of lower GI bleeding-presumed diverticular   Strain and tear of left hip adductor muscle and quadricep muscle   Right Acute deep vein thrombosis (DVT)  -Presented with right calf  pain with duplex imaging consistent with acute DVT involving right posterior tibial and peroneal veins -EDP discussed with vascular services with recommendation for full dose anticoagulation -Given low GFR/chronic kidney disease not candidate for NOAC -continue with Lovenox, coumadin.  -INR 1.6 Day 5 bridge. Needs to be on lovenox at least 24 hours after INR at goal     Acute on chronic respiratory failure with hypoxia/COPD, severe/Subglottic stenosis w/chronic tracheostomy  -Patient on chronic 4 L bleed in to trach collar at home but currently requiring a bleed in of 6 L. -CT chest showed pulmonary edema, received lasix. Hypoxemia improved. Also there was debrides at the tip of tracheostomy tube. Nurse has provided trach care.  -continue with torsemide.  -Back on 10 L of oxygen, denies dyspnea. CCM evaluated trach. Patient on 5 L oxygen.  -continue with demadex.   Acute encephalopathy, confusion;  Patient confuse, also with hallucination.  ABG negative for hypercapnia. Hypoxemia. Will increase oxygen.  CT head negative.  Check MRI and ammonia level.  Haldol PRN for agitation.     CKD (chronic kidney disease) stage 4, GFR 15-29 ml/min  -Current renal function stable. Last cr 1.5---1.7 -resume demadex. Ct at 1.4.     History of lower GI bleeding-presumed diverticular -Required 2 units PRBCS during hospitalization in early September -Plavix was discontinued in favor of aspirin alone in October -No further GI bleeding symptoms since previous admission -Monitor closely with initiation of anticoagulation for acute DVT -repeat hb in am.     Strain and tear of left hip adductor muscle and quadricep muscle -Occurred nearly 2 months ago after mechanical fall -Patient with persistent pain primarily with weightbearing and ambulation -PT and OT evaluation -Dr Farris HasKramer recommend outpatient follow up.     CAD (coronary artery disease) -Currently asymptomatic -Plavix discontinued on 10/11  by cardiologist due to recent GI bleeding symptoms -Continue baby aspirin -Continue carvedilol, Zocor and Imdur    Acute on Chronic combined systolic and diastolic CHF, NYHA class 2 (EF 30-35%) per ECHO 2017 -Continue home carvedilol, hydralazine -Current weight stable at 164 pounds and has increased from a nadir of around 154 pounds in late August -received 80 mg IV lasix.  -continue with  Torsemide/     Diabetes mellitus type 2, uncontrolled  -Mild hyperglycemia at presentation -hold Tresiba, use lantus while in the hospital.  -decrease lantus dose due to hypoglycemia./     Hypertension    Hyperlipidemia -Continue statin    Hypothyroidism status post thyroidectomy -Continue Synthroid    History of CVA (cerebrovascular accident) -Continue statin and baby aspirin as above -when INR at goal will discontinue aspirin.     Anemia -Current hemoglobin stable and baseline of 10.3    Nonspecific abnormal electrocardiogram (ECG) (EKG)/QT prolongation -Current QTc within normal limits      DVT prophylaxis:lovenox, coumadin  Code Status: full code.  Family Communication: care discussed with patient.  Disposition Plan: remain in patient.    Consultants:   none   Procedures:   Doppler LE; positive for DVT right peroneal, tibial.   Antimicrobials:  none  Subjective: Denies dyspnea.  She is confused, intermittent. Thought she was at home, and need to wash some clothes.  .   Objective: Vitals:  09/06/16 0910 09/06/16 1129 09/06/16 1259 09/06/16 1300  BP:  (!) 145/53    Pulse: 88  80   Resp: 18  16   Temp:      TempSrc:      SpO2: 99%  100% 100%  Weight:      Height:       No intake or output data in the 24 hours ending 09/06/16 1328 Filed Weights   09/04/16 0538 09/05/16 0357 09/06/16 0405  Weight: 77.2 kg (170 lb 1.6 oz) 77.2 kg (170 lb 3.2 oz) 76.4 kg (168 lb 6.9 oz)    Examination:  General exam: Appears calm and comfortable , trach in place.    Respiratory system: Clear to auscultation. Respiratory effort normal. Cardiovascular system: S1 & S2 heard, RRR. No JVD, murmurs, rubs, gallops or clicks. No pedal edema. Gastrointestinal system: Abdomen is nondistended, soft and nontender. No organomegaly or masses felt. Normal bowel sounds heard. Central nervous system: Alert , not oriented. No focal neurological deficits. Confuse.  Extremities: Symmetric 5 x 5 power. Skin: No rashes, lesions or ulcers Psychiatry: Judgement and insight appear normal. Mood & affect appropriate.     Data Reviewed: I have personally reviewed following labs and imaging studies  CBC:  Recent Labs Lab 09/02/16 0346 09/02/16 1139 09/03/16 0229 09/04/16 0135 09/06/16 0740  WBC  --  7.7 7.7 9.4 9.2  NEUTROABS  --  5.6  --   --   --   HGB 10.5* 10.3* 9.5* 9.6* 10.6*  HCT 31.0* 33.1* 30.6* 30.7* 33.1*  MCV  --  96.2 95.6 94.8 93.8  PLT  --  184 180 200 212   Basic Metabolic Panel:  Recent Labs Lab 09/02/16 1139 09/02/16 1541 09/03/16 0229 09/04/16 0135 09/05/16 0208 09/06/16 0740  NA 143  --  144 138 139 137  K 4.3  --  3.8 4.1 4.6 4.4  CL 108  --  107 101 100* 100*  CO2 28  --  29 28 32 28  GLUCOSE 128*  --  100* 110* 142* 203*  BUN 37*  --  33* 37* 37* 35*  CREATININE 1.37*  --  1.40* 1.94* 1.70* 1.46*  CALCIUM 8.6*  --  8.5* 8.7* 8.8* 8.8*  MG  --  2.2  --   --   --   --   PHOS  --  4.4  --   --   --   --    GFR: Estimated Creatinine Clearance: 34.3 mL/min (by C-G formula based on SCr of 1.46 mg/dL (H)). Liver Function Tests:  Recent Labs Lab 09/03/16 0229  AST 19  ALT 18  ALKPHOS 71  BILITOT 0.6  PROT 5.8*  ALBUMIN 3.1*   No results for input(s): LIPASE, AMYLASE in the last 168 hours. No results for input(s): AMMONIA in the last 168 hours. Coagulation Profile:  Recent Labs Lab 09/02/16 1249 09/03/16 0229 09/04/16 0135 09/05/16 0208 09/06/16 0253  INR 1.07 1.07 1.09 1.32 1.67   Cardiac Enzymes:  Recent  Labs Lab 09/02/16 0338  CKTOTAL 162   BNP (last 3 results) No results for input(s): PROBNP in the last 8760 hours. HbA1C: No results for input(s): HGBA1C in the last 72 hours. CBG:  Recent Labs Lab 09/05/16 1614 09/05/16 2133 09/06/16 0500 09/06/16 0623 09/06/16 1100  GLUCAP 183* 261* 217* 220* 258*   Lipid Profile: No results for input(s): CHOL, HDL, LDLCALC, TRIG, CHOLHDL, LDLDIRECT in the last 72 hours. Thyroid Function Tests: No results for input(s):  TSH, T4TOTAL, FREET4, T3FREE, THYROIDAB in the last 72 hours. Anemia Panel: No results for input(s): VITAMINB12, FOLATE, FERRITIN, TIBC, IRON, RETICCTPCT in the last 72 hours. Sepsis Labs: No results for input(s): PROCALCITON, LATICACIDVEN in the last 168 hours.  No results found for this or any previous visit (from the past 240 hour(s)).       Radiology Studies: Ct Head Wo Contrast  Result Date: 09/06/2016 CLINICAL DATA:  Confusion EXAM: CT HEAD WITHOUT CONTRAST TECHNIQUE: Contiguous axial images were obtained from the base of the skull through the vertex without intravenous contrast. COMPARISON:  CT head 12/15/2011 FINDINGS: Brain: Ventricle size normal. Cerebral volume normal for age. Chronic microvascular ischemic change in the white matter unchanged. Negative for acute infarct, hemorrhage, or mass. No shift of the midline structures. Vascular: Extensive atherosclerotic calcification in the cavernous carotid bilaterally and in the vertebral arteries. Negative for dense MCA Skull: Enlargement of the sella which is filled with CSF consistent with empty sella. This is unchanged. Sinuses/Orbits: Negative Other: None IMPRESSION: Atrophy and chronic microvascular ischemic changes are stable. No acute abnormality. Electronically Signed   By: Marlan Palauharles  Clark M.D.   On: 09/06/2016 10:49   Dg Chest Port 1 View  Result Date: 09/05/2016 CLINICAL DATA:  Hypoxia.  Short of breath EXAM: PORTABLE CHEST 1 VIEW COMPARISON:  09/02/2016  FINDINGS: Tracheostomy in good position and unchanged. Cardiac enlargement. Pulmonary vascular congestion. Mild bilateral airspace disease unchanged most consistent with mild edema. Negative for pleural effusion. IMPRESSION: No significant interval change. Mild airspace disease most consistent with edema. Electronically Signed   By: Marlan Palauharles  Clark M.D.   On: 09/05/2016 12:56        Scheduled Meds: . aspirin EC  81 mg Oral Daily  . budesonide (PULMICORT) nebulizer solution  0.25 mg Nebulization BID  . carvedilol  12.5 mg Oral BID WC  . dicyclomine  20 mg Oral TID AC  . DULoxetine  60 mg Oral QHS  . enoxaparin (LOVENOX) injection  80 mg Subcutaneous Q24H  . ferrous sulfate  325 mg Oral BID WC  . gabapentin  300 mg Oral QHS  . hydrALAZINE  25 mg Oral TID  . insulin aspart  0-5 Units Subcutaneous QHS  . insulin aspart  0-9 Units Subcutaneous TID WC  . ipratropium-albuterol  3 mL Nebulization Q8H  . isosorbide mononitrate  15 mg Oral Daily  . levothyroxine  75 mcg Oral QAC breakfast  . multivitamin with minerals  1 tablet Oral Daily  . pantoprazole  20 mg Oral Daily  . polyethylene glycol  17 g Oral Daily  . simvastatin  40 mg Oral QPM  . tiZANidine  2 mg Oral Daily  . torsemide  40 mg Oral Daily  . warfarin  7.5 mg Oral ONCE-1800  . Warfarin - Pharmacist Dosing Inpatient   Does not apply q1800   Continuous Infusions:   LOS: 4 days    Time spent: 35 minutes.     Alba Coryegalado, Sharolyn Weber A, MD [facility] Pager [phone]  If 7PM-7AM, please contact night-coverage [email or web address] Password [email or web address] 09/06/2016, 1:28 PM

## 2016-09-06 NOTE — Progress Notes (Signed)
Patient has become disoriented over night. She is oriented to self and place but is seeing people and objects that aren't there and is talking to herself. She has also become incontinent. The Physician and Rapid have been made aware. An ABG and CT scan were ordered. The patient has been placed on a continuous pulse ox to montor her O2 sat.

## 2016-09-06 NOTE — Care Management Important Message (Signed)
Important Message  Patient Details  Name: [name] MRN: [id number] Date of Birth: 02/27/1946   Medicare Important Message Given:  Yes    Kyla BalzarineShealy, Cassey Hurrell Abena 09/06/2016, 8:43 AM

## 2016-09-06 NOTE — Progress Notes (Signed)
Inpatient Diabetes Program Recommendations  AACE/ADA: New Consensus Statement on Inpatient Glycemic Control (2015)  Target Ranges:  Prepandial:   less than 140 mg/dL      Peak postprandial:   less than 180 mg/dL (1-2 hours)      Critically ill patients:  140 - 180 mg/dL   Lab Results  Component Value Date   GLUCAP 220 (H) 09/06/2016   HGBA1C 8.7 (H) 09/02/2016    Review of Glycemic Control Results for [name], [name] (MRN [id number]) as of 09/06/2016 09:26  Ref. Range 09/05/2016 06:07 09/05/2016 11:23 09/05/2016 16:14 09/05/2016 21:33 09/06/2016 05:00 09/06/2016 06:23  Glucose-Capillary Latest Ref Range: 65 - 99 mg/dL 914160 (H) 782197 (H) 956183 (H) 261 (H) 217 (H) 220 (H)   Inpatient Diabetes Program Recommendations:  Please consider restarting Lantus @ lower dose of 8 units daily.  Thank you, Billy FischerJudy E. Bernita Beckstrom, RN, MSN, CDE Inpatient Glycemic Control Team Team Pager [phone] (8am-5pm) 09/06/2016 9:27 AM

## 2016-09-06 NOTE — Evaluation (Signed)
Passy-Muir Speaking Valve - Evaluation Patient Details  Name: [name] MRN: [id number] Date of Birth: 11/04/1945  Today's Date: 09/06/2016 Time: 1010-1020 SLP Time Calculation (min) (ACUTE ONLY): 10 min  Past Medical History:  Past Medical History:  Diagnosis Date  . Anemia   . Arthritis    "hands, back" (06/28/2016)  . BRBPR (bright red blood per rectum) 06/28/2016  . CAD (coronary artery disease)    a. S/p CABG and stenting // b. Myoview 3/16: high risk EF 25%, ant-apical AK, ant scar with peri-infarct ischemia // c. LHC 3/16 - EF 45%, inferior hypokinesis, oLAD 100, oLCx 40, branch vessel 50, pRCA 100 dRCA , RV br 90, S-RCA occluded, S-LAD patent with inf-apical LAD beyond graft 70 >> med Rx  . Carotid artery disease (HCC)    a. s/p L CEA // b. Carotid US 4/16: R 40-59%; L CEA patent with 40-59%  . Chronic combined systolic and diastolic CHF (congestive heart failure) (HCC)    a. 11/2011 Echo with EF 30-35%, global hypokinesis, and inferior akinesis;  b. 12/2014 Echo: EF 50%, mod LVH, Ao sclerosis w/o stenosis, mildly dil LA, mild to mod RV dysfxn. // c. Echo 10/16: Moderate LVH, EF 35-40%, inferior AK, grade 1 diastolic dysfunction, MAC, mild MR, moderate LAE  . Chronic lower back pain   . CKD (chronic kidney disease), stage III   . COPD (chronic obstructive pulmonary disease) (HCC)    a. prn and HS supplemental O2.  . DDD (degenerative disc disease), lumbar   . Depression   . GERD (gastroesophageal reflux disease)   . History of blood transfusion ~ 2015; 06/28/2016   "anemia"  . History of IBS   . History of tracheostomy 08/26/2013   Dr. Jenne PaneBates  . Hyperlipidemia   . Hypertension   . Hypothyroidism    Goiter  . MI (myocardial infarction) 1997  . On home oxygen therapy    "4L at night & prn during the day" (06/28/2016)  . PAD (peripheral artery disease) (HCC)   . Stroke [facility](HCC)    MRI 11/2011 with remote occipital lobe. MRA with moderate left focal vertebral artery stenosis   . Type II diabetes mellitus (HCC)    Past Surgical History:  Past Surgical History:  Procedure Laterality Date  . ABDOMINAL AORTAGRAM N/A 04/05/2013   Procedure: ABDOMINAL Ronny FlurryAORTAGRAM;  Surgeon: Sherren Kernsharles E Fields, MD;  [facility];  Service: Cardiovascular;  Laterality: N/A;  . [id number]  [id number]  0815-2012   Aortogram by Dr. Italyhad McKenzie [facility] [facility]  . CAROTID ENDARTERECTOMY Left ~2008  . CARPAL TUNNEL RELEASE Left   . CATARACT EXTRACTION W/ INTRAOCULAR LENS IMPLANT & ANTERIOR VITRECTOMY, BILATERAL  ~2007-02/2016   "left-right"  . CHOLECYSTECTOMY OPEN    . COLONOSCOPY N/A 10/27/2014   Procedure: COLONOSCOPY;  Surgeon: Willis ModenaWilliam Outlaw, MD;  [facility];  Service: Endoscopy;  Laterality: N/A;  . CORONARY ARTERY BYPASS GRAFT     2 vessel  . DILATION AND CURETTAGE OF UTERUS    . FEMORAL-TIBIAL BYPASS GRAFT Left 06/11/2013   Procedure: BYPASS GRAFT  LEFT FEMORAL- POSTERIOR TIBIAL ARTERY/ REDO;  Surgeon: Sherren Kernsharles E Fields, MD;  [facility];  Service: Vascular;  Laterality: Left;  . LEFT AND RIGHT HEART CATHETERIZATION WITH CORONARY ANGIOGRAM N/A 01/09/2015   Procedure: LEFT AND RIGHT HEART CATHETERIZATION WITH CORONARY ANGIOGRAM;  Surgeon: Marykay Lexavid W Harding, MD;  [facility];  Service: Cardiovascular;  Laterality: N/A;  . PR VEIN BYPASS GRAFT,AORTO-FEM-POP     Right common  femoral-AK popliteal BPG & Right Popliteal-posterior tibial  . PR VEIN BYPASS GRAFT,AORTO-FEM-POP     Left Fem-pop BPG  . PTCA    . SPINE SURGERY  Oct. 27, 2014   Injection - Back  . THROMBECTOMY FEMORAL ARTERY Left 06/11/2013   Procedure: THROMBECTOMY FEMORAL ARTERY;  Surgeon: Sherren Kernsharles E Fields, MD;  [facility];  Service: Vascular;  Laterality: Left;  . THYROIDECTOMY    . TRACHEOSTOMY TUBE PLACEMENT  01/02/2012  . TUBAL LIGATION     HPI:  70 year old female with chronic trach (March 2013) secondary to tracheal stenosis from remote ETT. Admitted 11/10 with calf pain found to be DVT. CT chest  also demonstrated debris at tracheal tube tip. Pt has used a PMV independently for years, but has recently misplaced it.    Assessment / Plan / Recommendation Clinical Impression  Pt with #6 cuffless trach; has used PMV in the past.  Is able to talk around the trach and achieve adequate phonation, but pt acknowledges she "runs out of breath" without valve in place.  New valve provided.  No changes in Sp02; pt with improved efficiency of phonation with PMV in place.  She is independent in its use and care.  No further SLP needs identifed - our services will sign off.     SLP Assessment  Patient does not need any further Speech Lanaguage Pathology Services    Follow Up Recommendations  None    Frequency and Duration         PMSV Trial PMSV was placed for: ten minutes Able to redirect subglottic air through upper airway: Yes Able to Attain Phonation: Yes Voice Quality: Normal Able to Expectorate Secretions: Yes Breath Support for Phonation: Adequate Intelligibility: Intelligible SpO2 During Trial: 100 % Behavior: Alert   Tracheostomy Tube  Additional Tracheostomy Tube Assessment Fenestrated: No  #6 cuffless   Vent Dependency  FiO2 (%): 28 %    Cuff Deflation Trial  GO Tolerated Cuff Deflation:  (cuffless)        Carolan ShiverCouture, Khristin Keleher Laurice 09/06/2016, 10:23 AM

## 2016-09-06 NOTE — Progress Notes (Signed)
Tried to call the daughter to discuss the patient's baseline and increased confusion. Was able to reach her nor was I was able to leave a message.

## 2016-09-07 LAB — GLUCOSE, CAPILLARY
GLUCOSE-CAPILLARY: 181 mg/dL — AB (ref 65–99)
GLUCOSE-CAPILLARY: 216 mg/dL — AB (ref 65–99)
Glucose-Capillary: 195 mg/dL — ABNORMAL HIGH (ref 65–99)
Glucose-Capillary: 222 mg/dL — ABNORMAL HIGH (ref 65–99)

## 2016-09-07 LAB — URINALYSIS, ROUTINE W REFLEX MICROSCOPIC
Bilirubin Urine: NEGATIVE
Glucose, UA: NEGATIVE mg/dL
Hgb urine dipstick: NEGATIVE
KETONES UR: NEGATIVE mg/dL
NITRITE: NEGATIVE
PH: 6 (ref 5.0–8.0)
PROTEIN: NEGATIVE mg/dL
Specific Gravity, Urine: 1.011 (ref 1.005–1.030)

## 2016-09-07 LAB — BASIC METABOLIC PANEL
Anion gap: 10 (ref 5–15)
BUN: 32 mg/dL — ABNORMAL HIGH (ref 6–20)
CO2: 32 mmol/L (ref 22–32)
Calcium: 8.9 mg/dL (ref 8.9–10.3)
Chloride: 96 mmol/L — ABNORMAL LOW (ref 101–111)
Creatinine, Ser: 1.43 mg/dL — ABNORMAL HIGH (ref 0.44–1.00)
GFR calc Af Amer: 42 mL/min — ABNORMAL LOW (ref >60)
GFR calc non Af Amer: 36 mL/min — ABNORMAL LOW (ref >60)
Glucose, Bld: 173 mg/dL — ABNORMAL HIGH (ref 65–99)
Potassium: 4.2 mmol/L (ref 3.5–5.1)
Sodium: 138 mmol/L (ref 135–145)

## 2016-09-07 LAB — CBC
HCT: 33.4 % — ABNORMAL LOW (ref 36.0–46.0)
Hemoglobin: 10.4 g/dL — ABNORMAL LOW (ref 12.0–15.0)
MCH: 29.4 pg (ref 26.0–34.0)
MCHC: 31.1 g/dL (ref 30.0–36.0)
MCV: 94.4 fL (ref 78.0–100.0)
Platelets: 252 10*3/uL (ref 150–400)
RBC: 3.54 MIL/uL — ABNORMAL LOW (ref 3.87–5.11)
RDW: 16.7 % — AB (ref 11.5–15.5)
WBC: 10.7 10*3/uL — ABNORMAL HIGH (ref 4.0–10.5)

## 2016-09-07 LAB — URINE MICROSCOPIC-ADD ON: RBC / HPF: NONE SEEN RBC/hpf (ref 0–5)

## 2016-09-07 LAB — PROTIME-INR
INR: 2.19
PROTHROMBIN TIME: 24.7 s — AB (ref 11.4–15.2)

## 2016-09-07 MED ORDER — WARFARIN SODIUM 7.5 MG PO TABS
7.5000 mg | ORAL_TABLET | Freq: Once | ORAL | Status: AC
  Administered 2016-09-07: 7.5 mg via ORAL
  Filled 2016-09-07: qty 1

## 2016-09-07 MED ORDER — LEVOFLOXACIN 500 MG PO TABS
500.0000 mg | ORAL_TABLET | ORAL | Status: DC
  Administered 2016-09-08: 500 mg via ORAL
  Filled 2016-09-07 (×2): qty 1

## 2016-09-07 MED ORDER — IPRATROPIUM-ALBUTEROL 0.5-2.5 (3) MG/3ML IN SOLN
3.0000 mL | Freq: Two times a day (BID) | RESPIRATORY_TRACT | Status: DC
  Administered 2016-09-07 – 2016-09-09 (×5): 3 mL via RESPIRATORY_TRACT
  Filled 2016-09-07 (×6): qty 3

## 2016-09-07 MED ORDER — CIPROFLOXACIN HCL 500 MG PO TABS
500.0000 mg | ORAL_TABLET | Freq: Every day | ORAL | Status: DC
  Administered 2016-09-07: 500 mg via ORAL
  Filled 2016-09-07: qty 1

## 2016-09-07 NOTE — Progress Notes (Signed)
ANTICOAGULATION CONSULT NOTE  Pharmacy Consult for Lovenox/warfarin Indication: DVT   Assessment: 70 yof presented to the ED with leg pain and 79found to have an acute DVT. Started warfarin with lovenox bridge - day 6/5 of recommended VTE overlap, BUT need INR > 2 x 24 hrs before stopping Lovenox.  INR up to 2.19. CBC stable overnight with no bleeding issues have been noted. She continues on once daily lovenox as her renal function is borderline.   Noted patient had a fall yesterday but no injuries reported.   Goal of Therapy:  INR 2-3 Monitor platelets by anticoagulation protocol: Yes   Plan:  - Repeat warfarin 7.5mg  x1 dose tonight - No change in lovenox today - 80mg  q24 hours, hopefully can d/c tomorrow. - Daily INR/CBC q72 hours - Monitor for s/sx bleeding   Allergies  Allergen Reactions  . Aldactone [Spironolactone] Other (See Comments)    Severe hyperkalemia   . Lisinopril Other (See Comments) and Cough    Hypotension also  . Crestor [Rosuvastatin Calcium] Other (See Comments)    Muscle Pain  . Vicodin [Hydrocodone-Acetaminophen] Nausea And Vomiting    Patient Measurements: Height: 5\' 2"  (157.5 cm) Weight: 163 lb 12.8 oz (74.3 kg) IBW/kg (Calculated) : 50.1  Vital Signs: Temp: 98.3 F (36.8 C) (11/15 0418) Temp Source: Axillary (11/15 0418) BP: 164/78 (11/15 0418) Pulse Rate: 86 (11/15 0418)  Labs:  Recent Labs  09/05/16 0208 09/06/16 0253 09/06/16 0740 09/07/16 0443  HGB  --   --  10.6* 10.4*  HCT  --   --  33.1* 33.4*  PLT  --   --  212 252  LABPROT 16.5* 19.9*  --  24.7*  INR 1.32 1.67  --  2.19  CREATININE 1.70*  --  1.46* 1.43*    Estimated Creatinine Clearance: 34.6 mL/min (by C-G formula based on SCr of 1.43 mg/dL (H)).   Tad MooreJessica Natika Geyer, Pharm D, BCPS  Clinical Pharmacist Pager [phone]  09/07/2016 9:01 AM

## 2016-09-07 NOTE — Progress Notes (Signed)
Physical Therapy Treatment Patient Details Name: Scot JunDorothy I Bevard MRN: [id number] DOB: 10/30/1945 Today's Date: 09/07/2016    History of Present Illness pt presents with R LE DVT and found to have R 5th phalanx subacute fx.  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      PT Comments    Pt making slow progress with mobility. Pain in RLE continues to be limiting patient with standing tolerance and weightbearing. Pt requires min to mod assist with RW for transfers and able to ambulate up to 5' max at this time. Continue to recommend SNF for follow up therapies to increase independence to return to PLOF.   Follow Up Recommendations  SNF     Equipment Recommendations  None recommended by PT    Recommendations for Other Services       Precautions / Restrictions Precautions Precautions: Fall Restrictions Weight Bearing Restrictions: No    Mobility  Bed Mobility Overal bed mobility: Needs Assistance Bed Mobility: Rolling;Supine to Sit Rolling: Supervision   Supine to sit: Min assist;HOB elevated     General bed mobility comments: cues for hand placement   Transfers Overall transfer level: Needs assistance Equipment used: Rolling walker (2 wheeled) Transfers: Sit to/from UGI CorporationStand;Stand Pivot Transfers Sit to Stand: Min assist;Mod assist Stand pivot transfers: Min assist       General transfer comment: cues for hand placement and technique. Min assist from bed but mod assist from low recliner. Decreased ability to maintain weight through RLE due to pain.   Ambulation/Gait Ambulation/Gait assistance: Min assist;Mod assist Ambulation Distance (Feet): 5 Feet Assistive device: Rolling walker (2 wheeled)       General Gait Details: decreased weightbearing on RLE; limited distance due to RLE pain. mIn assist to start but after a few steps required mod assist. quick to sit in recliner without  reaching back    Stairs            Wheelchair Mobility    Modified Rankin (Stroke Patients Only)       Balance     Sitting balance-Leahy Scale: Good       Standing balance-Leahy Scale: Poor                      Cognition Arousal/Alertness: Awake/alert Behavior During Therapy: WFL for tasks assessed/performed Overall Cognitive Status: Within Functional Limits for tasks assessed                      Exercises      General Comments        Pertinent Vitals/Pain Pain Assessment: 0-10 Pain Score: 6  Pain Location: R foot and calf to the touch and with weightbearing Pain Descriptors / Indicators: Aching;Guarding Pain Intervention(s): RN gave pain meds during session;Monitored during session;Limited activity within patient's tolerance;Other (comment) (elevated at end of session)    Home Living                      Prior Function            PT Goals (current goals can now be found in the care plan section) Acute Rehab PT Goals Time For Goal Achievement: 09/18/16 Progress towards PT goals: Progressing toward goals    Frequency    Min 3X/week      PT Plan Current plan remains appropriate    Co-evaluation  End of Session Equipment Utilized During Treatment: Oxygen Activity Tolerance: Patient limited by pain Patient left: in chair;with call bell/phone within reach     Time: 0827-0847 PT Time Calculation (min) (ACUTE ONLY): 20 min  Charges:  $Therapeutic Activity: 8-22 mins                    G Codes:      Karolee StampsGray, Devora Tortorella Darrol PokeBrescia  Rush Salce B. Antaniya Venuti, PT, DPT  09/07/2016, 8:54 AM

## 2016-09-07 NOTE — Progress Notes (Addendum)
PROGRESS NOTE  [name] ZOX:[id number] DOB: 01/22/1946 DOA: 09/02/2016 PCP: [name],CYNTHIA S, MD   LOS: 5 days   Brief Narrative: Pecolia Adesorothy I Beamonis a 70 y.o.femalewith medical history significant for chronic tracheostomy for tracheal stenosis in setting of O2 dependent severe COPD, diabetes, chronic combined diastolic and systolic heart failure with an EF of 30-35% ,hypertension, chronic anemia, history of prior CVA, dyslipidemia,. She was last hospitalized in early September 2017 with lower GI bleeding presumably related to diverticular etiology. She required 2 units PRBCsduring that admission. Symptoms were felt related to her antiplatelet agents of Plavix was held and scheduled to be resumed on 9/12. At follow-up cardiology visit on 10/11 patient's cardiologist opted to stop the Plavix given her history of GI bleeding but continued her aspirin. From a cardiology standpoint patient has not had any chest pain or shortness of breath. Patient and family report that at some point in September while attempting to board a [employer] there was an issue with the chemical step not being completely deployed and while the patient was attempting to step on this device the driver apparently was able to deploy the step therefore it dropped rapidly and the patient fell injuring her left leg. Since that time she has had significant pain in the groin and hip region and has been undergoing home PT and has been taking Norco without any improvement of her symptoms. She was referred to Dr. Farris HasKramer. An MRI of the left hip was obtained on 11/4.Shehas not yet followed up with Dr. 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 Lower extremity venous duplex has revealed an acute DVT involving the right posterior tibial and peroneal veins. EDP did discuss with vascular services and given patient's recent immobility and deconditioning full anti-coagulation was recommended.Initial plan was to have the patient go home on warfarin with Lovenox bridging but after further chart investigation it was discovered the patient had a lower GI bleeding admission with requirement for transfusion back in September and therefore it was best felt to initiate anticoagulation in the hospital setting.  Patient admitted with acute on chronic hypoxic respiratory failure, and right lower extremity DVT. She was started on Lovenox and coumadin for DVT. Unable to use NOAC due to renal failure.  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   Subglottic stenosis w/chronic tracheostomy   Anemia   Nonspecific abnormal electrocardiogram (ECG) (EKG)/QT prolongation   Acute on chronic respiratory failure with hypoxia (HCC)   COPD, severe (HCC)   CKD (chronic kidney disease) stage 4, GFR 15-29 ml/min (HCC)   History of lower GI bleeding-presumed diverticular   Strain and tear of left hip adductor muscle and quadricep muscle   Right Acute deep  vein thrombosis (DVT)  - Presented with right calf pain with duplex imaging consistent with acute DVT involving right posterior tibial and peroneal veins - EDP discussed  with vascular services with recommendation for full dose anticoagulation - Given low GFR/chronic kidney disease not candidate for NOAC - continue with Lovenox, coumadin, bridge for an additional day per pharmacy - monitor INR  Acute on chronic respiratory failure with hypoxia/COPD, severe/Subglottic stenosis w/chronic tracheostomy - Patient on chronic 4 L bleed in to trach collar at homebut currently requiring a bleed in of 6 L. - CT chest showed pulmonary edema, received lasix. Hypoxemia improved. Also there was debrides at the tip of tracheostomy tube. Nurse has provided trach care.  -continue with torsemide.  - CCM evaluated trach, stable, signed off, changed trach to cuffless #6 - continue with demadex.   Acute encephalopathy, confusion;  - patient with episodes of confusion and hallucinations - ABG negative for hypercapnia, CT head negative, MRI negative, ammonia level normal  - appears resolved this morning. Continue to monitor for 24 h - some evidence of UTI, start Cipro (prior micro with Klebsiella sstv to Cipro)  UTI - cipro, urine culture  CKD (chronic kidney disease) stage 4, GFR 15-29 ml/min  - Current renal function stable. Last cr 1.43  History of lower GI bleeding-presumed diverticular - Required 2 units PRBCSduring hospitalization in early September - Plavix was discontinued in favor of aspirin alone in October - No further GI bleeding symptoms since previous admission - Monitor closely with initiation of anticoagulation for acute DVT - Hb stable, no bleeding  Strain and tear of left hip adductor muscle and quadricep muscle - Occurred nearly 2 months ago after mechanical fall - Patient with persistent pain primarily with weightbearing and ambulation - PT and OT evaluation recommending SNF - Dr Farris Has recommend outpatient follow up.   CAD (coronary artery disease) - Currently asymptomatic  -Plavix discontinued on 10/11 by cardiologist due to recent GI  bleeding symptoms - Continue baby aspirin - Continue carvedilol, Zocorand Imdur  Acute on Chronic combined systolic and diastolic CHF, NYHA class 2 (EF 30-35%) per ECHO 2017 - Continue home carvedilol, hydralazine - Current weight stable at 163 pounds and has increased from a nadir of around 154 pounds in late August - continue with  Torsemide  Diabetes mellitus type 2, uncontrolled  - Mild hyperglycemia at presentation - hold Tresiba, use lantus while in the hospital.  - CBGs 170-200s  Hypertension  Hyperlipidemia - Continue statin  Hypothyroidism status post thyroidectomy - Continue Synthroid  History of CVA (cerebrovascular accident) - Continue statin and baby aspirin as above  Anemia - Current hemoglobin stable and baseline of 10.3  Nonspecific abnormal electrocardiogram (ECG) (EKG)/QT prolongation - Current QTcwithin normal limits    DVT prophylaxis: coumadin Code Status: Full Family Communication: none  Disposition Plan: SNF 1 day if stable  Consultants:   PCCM  Procedures:   None   Antimicrobials:  None    Subjective: - no chest pain / dyspnea, AxOx4 - complains of leg pain  Objective: Vitals:   09/07/16 0050 09/07/16 0418 09/07/16 0945 09/07/16 0948  BP:  (!) 164/78    Pulse: 82 86  80  Resp: 16 18  16   Temp:  98.3 F (36.8 C)    TempSrc:  Axillary    SpO2: 96% 96% 94% 94%  Weight:  74.3 kg (163 lb 12.8 oz)    Height:        Intake/Output Summary (Last 24 hours) at  09/07/16 1128 Last data filed at 09/07/16 1000  Gross per 24 hour  Intake              240 ml  Output                2 ml  Net              238 ml   Filed Weights   09/05/16 0357 09/06/16 0405 09/07/16 0418  Weight: 77.2 kg (170 lb 3.2 oz) 76.4 kg (168 lb 6.9 oz) 74.3 kg (163 lb 12.8 oz)    Examination: Constitutional: NAD Vitals:   09/07/16 0050 09/07/16 0418 09/07/16 0945 09/07/16 0948  BP:  (!) 164/78    Pulse: 82 86  80  Resp: 16 18  16   Temp:   98.3 F (36.8 C)    TempSrc:  Axillary    SpO2: 96% 96% 94% 94%  Weight:  74.3 kg (163 lb 12.8 oz)    Height:       Eyes: PERRL, lids and conjunctivae normal Respiratory: clear to auscultation bilaterally, no wheezing, no crackles. Cardiovascular: Regular rate and rhythm, no murmurs / rubs / gallops. Abdomen: no tenderness. Bowel sounds positive.  Musculoskeletal: no clubbing / cyanosis.   Skin: no rashes, lesions, ulcers. No induration Neurologic: non focal    Data Reviewed: I have personally reviewed following labs and imaging studies  CBC:  Recent Labs Lab 09/02/16 1139 09/03/16 0229 09/04/16 0135 09/06/16 0740 09/07/16 0443  WBC 7.7 7.7 9.4 9.2 10.7*  NEUTROABS 5.6  --   --   --   --   HGB 10.3* 9.5* 9.6* 10.6* 10.4*  HCT 33.1* 30.6* 30.7* 33.1* 33.4*  MCV 96.2 95.6 94.8 93.8 94.4  PLT 184 180 200 212 252   Basic Metabolic Panel:  Recent Labs Lab 09/02/16 1541 09/03/16 0229 09/04/16 0135 09/05/16 0208 09/06/16 0740 09/07/16 0443  NA  --  144 138 139 137 138  K  --  3.8 4.1 4.6 4.4 4.2  CL  --  107 101 100* 100* 96*  CO2  --  29 28 32 28 32  GLUCOSE  --  100* 110* 142* 203* 173*  BUN  --  33* 37* 37* 35* 32*  CREATININE  --  1.40* 1.94* 1.70* 1.46* 1.43*  CALCIUM  --  8.5* 8.7* 8.8* 8.8* 8.9  MG 2.2  --   --   --   --   --   PHOS 4.4  --   --   --   --   --    GFR: Estimated Creatinine Clearance: 34.6 mL/min (by C-G formula based on SCr of 1.43 mg/dL (H)). Liver Function Tests:  Recent Labs Lab 09/03/16 0229  AST 19  ALT 18  ALKPHOS 71  BILITOT 0.6  PROT 5.8*  ALBUMIN 3.1*   No results for input(s): LIPASE, AMYLASE in the last 168 hours.  Recent Labs Lab 09/06/16 1421  AMMONIA 14   Coagulation Profile:  Recent Labs Lab 09/03/16 0229 09/04/16 0135 09/05/16 0208 09/06/16 0253 09/07/16 0443  INR 1.07 1.09 1.32 1.67 2.19   Cardiac Enzymes:  Recent Labs Lab 09/02/16 0338  CKTOTAL 162   BNP (last 3 results) No results for  input(s): PROBNP in the last 8760 hours. HbA1C: No results for input(s): HGBA1C in the last 72 hours. CBG:  Recent Labs Lab 09/06/16 1100 09/06/16 1654 09/06/16 2144 09/07/16 0629 09/07/16 1103  GLUCAP 258* 123* 260* 195* 222*   Lipid  Profile: No results for input(s): CHOL, HDL, LDLCALC, TRIG, CHOLHDL, LDLDIRECT in the last 72 hours. Thyroid Function Tests: No results for input(s): TSH, T4TOTAL, FREET4, T3FREE, THYROIDAB in the last 72 hours. Anemia Panel: No results for input(s): VITAMINB12, FOLATE, FERRITIN, TIBC, IRON, RETICCTPCT in the last 72 hours. Urine analysis:    Component Value Date/Time   COLORURINE YELLOW 09/07/2016 0652   APPEARANCEUR CLOUDY (A) 09/07/2016 0652   LABSPEC 1.011 09/07/2016 0652   PHURINE 6.0 09/07/2016 0652   GLUCOSEU NEGATIVE 09/07/2016 0652   HGBUR NEGATIVE 09/07/2016 0652   BILIRUBINUR NEGATIVE 09/07/2016 0652   KETONESUR NEGATIVE 09/07/2016 0652   PROTEINUR NEGATIVE 09/07/2016 0652   UROBILINOGEN 0.2 07/27/2015 1650   NITRITE NEGATIVE 09/07/2016 0652   LEUKOCYTESUR SMALL (A) 09/07/2016 0652   Sepsis Labs: Invalid input(s): PROCALCITONIN, LACTICIDVEN  No results found for this or any previous visit (from the past 240 hour(s)).    Radiology Studies: Ct Head Wo Contrast  Result Date: 09/06/2016 CLINICAL DATA:  Confusion EXAM: CT HEAD WITHOUT CONTRAST TECHNIQUE: Contiguous axial images were obtained from the base of the skull through the vertex without intravenous contrast. COMPARISON:  CT head 12/15/2011 FINDINGS: Brain: Ventricle size normal. Cerebral volume normal for age. Chronic microvascular ischemic change in the white matter unchanged. Negative for acute infarct, hemorrhage, or mass. No shift of the midline structures. Vascular: Extensive atherosclerotic calcification in the cavernous carotid bilaterally and in the vertebral arteries. Negative for dense MCA Skull: Enlargement of the sella which is filled with CSF consistent with empty  sella. This is unchanged. Sinuses/Orbits: Negative Other: None IMPRESSION: Atrophy and chronic microvascular ischemic changes are stable. No acute abnormality. Electronically Signed   By: Marlan Palau M.D.   On: 09/06/2016 10:49   Mr Brain Wo Contrast  Result Date: 09/06/2016 CLINICAL DATA:  Initial evaluation for his acute confusion, disorientation. EXAM: MRI HEAD WITHOUT CONTRAST TECHNIQUE: Multiplanar, multiecho pulse sequences of the brain and surrounding structures were obtained without intravenous contrast. COMPARISON:  Prior CT from earlier the same date. FINDINGS: Brain: Study is limited as the patient was unable to tolerate the full length of the exam. Mild diffuse prominence of the CSF containing spaces is compatible with generalized age-related cerebral atrophy. Patchy T2/FLAIR hyperintensity within the periventricular and deep white matter both cerebral hemispheres most compatible with chronic microvascular ischemic changes, moderate in nature. Chronic microvascular ischemic changes present within the pons. Focal encephalomalacia with associated gliosis within the parasagittal right parietal lobe compatible with remote ischemic infarct. Small amount of chronic hemorrhagic blood products present within this region. Few additional probable small remote left cerebellar infarcts noted. No abnormal foci of restricted diffusion to suggest acute or subacute ischemia. Gray-white matter differentiation maintained. No evidence for acute intracranial hemorrhage. No mass lesion, midline shift, or mass effect. No hydrocephalus. No extra-axial fluid collection. Major dural sinuses are grossly patent. Vascular: Major intracranial vascular flow voids are maintained. Skull and upper cervical spine: Craniocervical junction within normal limits. Visualized upper cervical spine unremarkable. Bone marrow signal intensity within normal limits. No scalp soft tissue abnormality. Sinuses/Orbits: The globes and orbital  soft tissues within normal limits. Patient is status post lens extraction bilaterally. Mild scattered mucosal thickening throughout the paranasal sinuses. More moderate opacity within the right sphenoid sinus, likely chronic. No air-fluid level to suggest active sinus infection. Trace opacity right mastoid air cells noted. Inner ear structures grossly normal. IMPRESSION: 1. No acute intracranial process identified. 2. Small remote right parietal infarct. 3. Moderate chronic microvascular ischemic disease.  Electronically Signed   By: Rise Mu M.D.   On: 09/06/2016 21:44   Dg Chest Port 1 View  Result Date: 09/05/2016 CLINICAL DATA:  Hypoxia.  Short of breath EXAM: PORTABLE CHEST 1 VIEW COMPARISON:  09/02/2016 FINDINGS: Tracheostomy in good position and unchanged. Cardiac enlargement. Pulmonary vascular congestion. Mild bilateral airspace disease unchanged most consistent with mild edema. Negative for pleural effusion. IMPRESSION: No significant interval change. Mild airspace disease most consistent with edema. Electronically Signed   By: Marlan Palau M.D.   On: 09/05/2016 12:56     Scheduled Meds: . aspirin EC  81 mg Oral Daily  . budesonide (PULMICORT) nebulizer solution  0.25 mg Nebulization BID  . carvedilol  12.5 mg Oral BID WC  . dicyclomine  20 mg Oral TID AC  . DULoxetine  60 mg Oral QHS  . enoxaparin (LOVENOX) injection  80 mg Subcutaneous Q24H  . ferrous sulfate  325 mg Oral BID WC  . gabapentin  300 mg Oral QHS  . hydrALAZINE  25 mg Oral TID  . insulin aspart  0-5 Units Subcutaneous QHS  . insulin aspart  0-9 Units Subcutaneous TID WC  . ipratropium-albuterol  3 mL Nebulization BID  . isosorbide mononitrate  15 mg Oral Daily  . levothyroxine  75 mcg Oral QAC breakfast  . multivitamin with minerals  1 tablet Oral Daily  . pantoprazole  20 mg Oral Daily  . polyethylene glycol  17 g Oral Daily  . simvastatin  40 mg Oral QPM  . tiZANidine  2 mg Oral Daily  . torsemide   40 mg Oral Daily  . warfarin  7.5 mg Oral ONCE-1800  . Warfarin - Pharmacist Dosing Inpatient   Does not apply q1800   Continuous Infusions:   Pamella Pert, MD, PhD [facility] Pager [phone]  If 7PM-7AM, please contact night-coverage [email or web address] Password [email or web address] 09/07/2016, 11:28 AM

## 2016-09-07 NOTE — Progress Notes (Signed)
Pt 6 beat v tach. Pt asymptomatic. Kirby notified. Will continue to monitor.   Cystal Shannahan, RN

## 2016-09-07 NOTE — Progress Notes (Signed)
Inpatient Diabetes Program Recommendations  AACE/ADA: New Consensus Statement on Inpatient Glycemic Control (2015)  Target Ranges:  Prepandial:   less than 140 mg/dL      Peak postprandial:   less than 180 mg/dL (1-2 hours)      Critically ill patients:  140 - 180 mg/dL   Lab Results  Component Value Date   GLUCAP 195 (H) 09/07/2016   HGBA1C 8.7 (H) 09/02/2016    Review of Glycemic Control Results for Scot JunBEAMON, Chika I (MRN [id number]) as of 09/07/2016 10:55  Ref. Range 09/06/2016 06:23 09/06/2016 11:00 09/06/2016 16:54 09/06/2016 21:44 09/07/2016 06:29  Glucose-Capillary Latest Ref Range: 65 - 99 mg/dL 409220 (H) 811258 (H) 914123 (H) 260 (H) 195 (H)    Inpatient Diabetes Program Recommendations:  Please consider restarting Lantus @ lower dose of 8 units daily.  Thank you, Billy FischerJudy E. Ladye Macnaughton, RN, MSN, CDE Inpatient Glycemic Control Team Team Pager [phone] (8am-5pm) 09/07/2016 10:55 AM

## 2016-09-08 ENCOUNTER — Inpatient Hospital Stay (HOSPITAL_COMMUNITY): Payer: Medicare Other

## 2016-09-08 LAB — GLUCOSE, CAPILLARY
GLUCOSE-CAPILLARY: 185 mg/dL — AB (ref 65–99)
GLUCOSE-CAPILLARY: 175 mg/dL — AB (ref 65–99)
GLUCOSE-CAPILLARY: 229 mg/dL — AB (ref 65–99)
GLUCOSE-CAPILLARY: 156 mg/dL — AB (ref 65–99)

## 2016-09-08 LAB — PROTIME-INR
INR: 2.62
Prothrombin Time: 28.6 seconds — ABNORMAL HIGH (ref 11.4–15.2)

## 2016-09-08 MED ORDER — FUROSEMIDE 10 MG/ML IJ SOLN
40.0000 mg | Freq: Once | INTRAMUSCULAR | Status: AC
  Administered 2016-09-08: 40 mg via INTRAVENOUS
  Filled 2016-09-08: qty 4

## 2016-09-08 MED ORDER — WARFARIN SODIUM 5 MG PO TABS
5.0000 mg | ORAL_TABLET | Freq: Once | ORAL | Status: AC
  Administered 2016-09-08: 5 mg via ORAL
  Filled 2016-09-08: qty 1

## 2016-09-08 NOTE — Progress Notes (Addendum)
PROGRESS NOTE  [name] VQQ:[id number] DOB: [birth year] DOA: 09/02/2016 PCP: Cala Bradford, MD   LOS: 6 days   Brief Narrative: [name] a 70 y.o.femalewith medical history significant for chronic tracheostomy for tracheal stenosis in setting of O2 dependent severe COPD, diabetes, chronic combined diastolic and systolic heart failure with an EF of 30-35% ,hypertension, chronic anemia, history of prior CVA, dyslipidemia,. She was last hospitalized in early September 2017 with lower GI bleeding presumably related to diverticular etiology. She required 2 units PRBCsduring that admission. Symptoms were felt related to her antiplatelet agents of Plavix was held and scheduled to be resumed on 9/12. At follow-up cardiology visit on 10/11 patient's cardiologist opted to stop the Plavix given her history of GI bleeding but continued her aspirin. From a cardiology standpoint patient has not had any chest pain or shortness of breath. Patient and family report that at some point in September while attempting to board a [employer]bus there was an issue with the chemical step not being completely deployed and while the patient was attempting to step on this device the driver apparently was able to deploy the step therefore it dropped rapidly and the patient fell injuring her left leg. Since that time she has had significant pain in the groin and hip region and has been undergoing home PT and has been taking Norco without any improvement of her symptoms. She was referred to Dr. Farris Has. An MRI of the left hip was obtained on 11/4.Shehas not yet followed up with Dr. 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 Lower extremity venous duplex has revealed an acute DVT involving the right posterior tibial and peroneal veins. EDP did discuss with vascular services and given patient's recent immobility and deconditioning full anti-coagulation was recommended.Initial plan was to have the patient go home on warfarin with Lovenox bridging but after further chart investigation it was discovered the patient had a lower GI bleeding admission with requirement for transfusion back in September and therefore it was best felt to initiate anticoagulation in the hospital setting.  Patient admitted with acute on chronic hypoxic respiratory failure, and right lower extremity DVT. She was started on Lovenox and coumadin for DVT. Unable to use NOAC due to renal failure.  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   Subglottic stenosis w/chronic tracheostomy   Anemia   Nonspecific abnormal electrocardiogram (ECG) (EKG)/QT prolongation   Acute on chronic respiratory failure with hypoxia (HCC)   COPD, severe (HCC)   CKD (chronic kidney disease) stage 4, GFR 15-29 ml/min (HCC)   History of lower GI bleeding-presumed diverticular   Strain and tear of left hip adductor muscle and quadricep muscle   Right Acute deep  vein thrombosis (DVT)  - Presented with right calf pain with duplex imaging consistent with acute DVT involving right posterior tibial and peroneal veins - EDP discussed  with vascular services with recommendation for full dose anticoagulation - Given low GFR/chronic kidney disease not candidate for NOAC - continue with Lovenox, coumadin, bridge for an additional day per pharmacy - monitor INR  Acute on chronic respiratory failure with hypoxia/COPD, severe/Subglottic stenosis w/chronic tracheostomy - Patient on chronic 4 L bleed in to trach collar at homebut currently requiring a bleed in of 6 L. - CT chest showed pulmonary edema, received lasix. Hypoxemia improved. Also there was debrides at the tip of tracheostomy tube. Nurse has provided trach care.  -continue with torsemide.  - CCM evaluated trach, stable, signed off, changed trach to cuffless #6 - continue with demadex.   - patient supposed to be discharged on 11/16, however in the morning she had several desaturation events with increasing secretions requiring more aggressive suctioning. Will obtain CXR and defer discharge for 1-2 days  Acute encephalopathy, confusion;  - patient with episodes of confusion and hallucinations - ABG negative for hypercapnia, CT head negative, MRI negative, ammonia level normal  - appears resolved this morning. Continue to monitor for 24 h - some evidence of UTI, started Cipro (prior micro with Klebsiella sstv to Cipro)  UTI - cipro, urine culturewith GNRs, awaiting final speciation  CKD (chronic kidney disease) stage 4, GFR 15-29 ml/min  - Current renal function stable. Last cr 1.43  History of lower GI bleeding-presumed diverticular - Required 2 units PRBCSduring hospitalization in early September - Plavix was discontinued in favor of aspirin alone in October - No further GI bleeding symptoms since previous admission - Monitor closely with initiation of anticoagulation for acute DVT - Hb stable, no bleeding  Strain and tear of left hip adductor muscle and quadricep muscle - Occurred nearly 2 months ago after mechanical fall - Patient with persistent  pain primarily with weightbearing and ambulation - PT and OT evaluation recommending SNF - Dr Farris HasKramer recommend outpatient follow up.   CAD (coronary artery disease) - Currently asymptomatic  -Plavix discontinued on 10/11 by cardiologist due to recent GI bleeding symptoms - Continue baby aspirin - Continue carvedilol, Zocorand Imdur  Acute on Chronic combined systolic and diastolic CHF, NYHA class 2 (EF 30-35%) per ECHO 2017 - Continue home carvedilol, hydralazine - Current weight stable at 163 pounds and has increased from a nadir of around 154 pounds in late August - continue with  Torsemide  Diabetes mellitus type 2, uncontrolled  - Mild hyperglycemia at presentation - hold Tresiba, use lantus while in the hospital.   Hypertension  Hyperlipidemia - Continue statin  Hypothyroidism status post thyroidectomy - Continue Synthroid  History of CVA (cerebrovascular accident) - Continue statin and baby aspirin as above  Anemia - Current hemoglobin stable and baseline of 10.3  Nonspecific abnormal electrocardiogram (ECG) (EKG)/QT prolongation - Current QTcwithin normal limits    DVT prophylaxis: coumadin Code Status: Full Family Communication: none  Disposition Plan: SNF 1 day if stable  Consultants:   PCCM  Procedures:   None   Antimicrobials:  None    Subjective: - no chest pain / dyspnea, AxOx4 - despite low o2 sats, denies SOB  Objective: Vitals:   09/08/16 0345 09/08/16 0857 09/08/16 0906 09/08/16 1147  BP: (!) 149/54     Pulse: 82  82 65  Resp: 20  20 16   Temp: 98.2 F (36.8 C)  TempSrc: Oral     SpO2: (!) 85% 93% 93% 93%  Weight: 73.1 kg (161 lb 2.5 oz)     Height:        Intake/Output Summary (Last 24 hours) at 09/08/16 1240 Last data filed at 09/08/16 0900  Gross per 24 hour  Intake              360 ml  Output                4 ml  Net              356 ml   Filed Weights   09/06/16 0405 09/07/16 0418 09/08/16 0345    Weight: 76.4 kg (168 lb 6.9 oz) 74.3 kg (163 lb 12.8 oz) 73.1 kg (161 lb 2.5 oz)    Examination: Constitutional: NAD Vitals:   09/08/16 0345 09/08/16 0857 09/08/16 0906 09/08/16 1147  BP: (!) 149/54     Pulse: 82  82 65  Resp: 20  20 16   Temp: 98.2 F (36.8 C)     TempSrc: Oral     SpO2: (!) 85% 93% 93% 93%  Weight: 73.1 kg (161 lb 2.5 oz)     Height:       Eyes: PERRL, lids and conjunctivae normal Respiratory: clear to auscultation bilaterally, no wheezing, no crackles. Cardiovascular: Regular rate and rhythm, no murmurs / rubs / gallops. Abdomen: no tenderness. Bowel sounds positive.  Musculoskeletal: no clubbing / cyanosis.   Skin: no rashes, lesions, ulcers. No induration Neurologic: non focal    Data Reviewed: I have personally reviewed following labs and imaging studies  CBC:  Recent Labs Lab 09/02/16 1139 09/03/16 0229 09/04/16 0135 09/06/16 0740 09/07/16 0443  WBC 7.7 7.7 9.4 9.2 10.7*  NEUTROABS 5.6  --   --   --   --   HGB 10.3* 9.5* 9.6* 10.6* 10.4*  HCT 33.1* 30.6* 30.7* 33.1* 33.4*  MCV 96.2 95.6 94.8 93.8 94.4  PLT 184 180 200 212 252   Basic Metabolic Panel:  Recent Labs Lab 09/02/16 1541 09/03/16 0229 09/04/16 0135 09/05/16 0208 09/06/16 0740 09/07/16 0443  NA  --  144 138 139 137 138  K  --  3.8 4.1 4.6 4.4 4.2  CL  --  107 101 100* 100* 96*  CO2  --  29 28 32 28 32  GLUCOSE  --  100* 110* 142* 203* 173*  BUN  --  33* 37* 37* 35* 32*  CREATININE  --  1.40* 1.94* 1.70* 1.46* 1.43*  CALCIUM  --  8.5* 8.7* 8.8* 8.8* 8.9  MG 2.2  --   --   --   --   --   PHOS 4.4  --   --   --   --   --    GFR: Estimated Creatinine Clearance: 34.3 mL/min (by C-G formula based on SCr of 1.43 mg/dL (H)). Liver Function Tests:  Recent Labs Lab 09/03/16 0229  AST 19  ALT 18  ALKPHOS 71  BILITOT 0.6  PROT 5.8*  ALBUMIN 3.1*   No results for input(s): LIPASE, AMYLASE in the last 168 hours.  Recent Labs Lab 09/06/16 1421  AMMONIA 14    Coagulation Profile:  Recent Labs Lab 09/04/16 0135 09/05/16 0208 09/06/16 0253 09/07/16 0443 09/08/16 0252  INR 1.09 1.32 1.67 2.19 2.62   Cardiac Enzymes:  Recent Labs Lab 09/02/16 0338  CKTOTAL 162   BNP (last 3 results) No results for input(s): PROBNP in  the last 8760 hours. HbA1C: No results for input(s): HGBA1C in the last 72 hours. CBG:  Recent Labs Lab 09/07/16 1103 09/07/16 1620 09/07/16 2253 09/08/16 0628 09/08/16 1133  GLUCAP 222* 181* 216* 185* 175*   Lipid Profile: No results for input(s): CHOL, HDL, LDLCALC, TRIG, CHOLHDL, LDLDIRECT in the last 72 hours. Thyroid Function Tests: No results for input(s): TSH, T4TOTAL, FREET4, T3FREE, THYROIDAB in the last 72 hours. Anemia Panel: No results for input(s): VITAMINB12, FOLATE, FERRITIN, TIBC, IRON, RETICCTPCT in the last 72 hours. Urine analysis:    Component Value Date/Time   COLORURINE YELLOW 09/07/2016 0652   APPEARANCEUR CLOUDY (A) 09/07/2016 0652   LABSPEC 1.011 09/07/2016 0652   PHURINE 6.0 09/07/2016 0652   GLUCOSEU NEGATIVE 09/07/2016 0652   HGBUR NEGATIVE 09/07/2016 0652   BILIRUBINUR NEGATIVE 09/07/2016 0652   KETONESUR NEGATIVE 09/07/2016 0652   PROTEINUR NEGATIVE 09/07/2016 0652   UROBILINOGEN 0.2 07/27/2015 1650   NITRITE NEGATIVE 09/07/2016 0652   LEUKOCYTESUR SMALL (A) 09/07/2016 0652   Sepsis Labs: Invalid input(s): PROCALCITONIN, LACTICIDVEN  Recent Results (from the past 240 hour(s))  Culture, Urine     Status: Abnormal (Preliminary result)   Collection Time: 09/07/16 11:43 AM  Result Value Ref Range Status   Specimen Description URINE, RANDOM  Final   Special Requests NONE  Final   Culture >=100,000 COLONIES/mL GRAM NEGATIVE RODS (A)  Final   Report Status PENDING  Incomplete      Radiology Studies: Mr Brain 13Wo Contrast  Result Date: 09/06/2016 CLINICAL DATA:  Initial evaluation for his acute confusion, disorientation. EXAM: MRI HEAD WITHOUT CONTRAST TECHNIQUE:  Multiplanar, multiecho pulse sequences of the brain and surrounding structures were obtained without intravenous contrast. COMPARISON:  Prior CT from earlier the same date. FINDINGS: Brain: Study is limited as the patient was unable to tolerate the full length of the exam. Mild diffuse prominence of the CSF containing spaces is compatible with generalized age-related cerebral atrophy. Patchy T2/FLAIR hyperintensity within the periventricular and deep white matter both cerebral hemispheres most compatible with chronic microvascular ischemic changes, moderate in nature. Chronic microvascular ischemic changes present within the pons. Focal encephalomalacia with associated gliosis within the parasagittal right parietal lobe compatible with remote ischemic infarct. Small amount of chronic hemorrhagic blood products present within this region. Few additional probable small remote left cerebellar infarcts noted. No abnormal foci of restricted diffusion to suggest acute or subacute ischemia. Gray-white matter differentiation maintained. No evidence for acute intracranial hemorrhage. No mass lesion, midline shift, or mass effect. No hydrocephalus. No extra-axial fluid collection. Major dural sinuses are grossly patent. Vascular: Major intracranial vascular flow voids are maintained. Skull and upper cervical spine: Craniocervical junction within normal limits. Visualized upper cervical spine unremarkable. Bone marrow signal intensity within normal limits. No scalp soft tissue abnormality. Sinuses/Orbits: The globes and orbital soft tissues within normal limits. Patient is status post lens extraction bilaterally. Mild scattered mucosal thickening throughout the paranasal sinuses. More moderate opacity within the right sphenoid sinus, likely chronic. No air-fluid level to suggest active sinus infection. Trace opacity right mastoid air cells noted. Inner ear structures grossly normal. IMPRESSION: 1. No acute intracranial process  identified. 2. Small remote right parietal infarct. 3. Moderate chronic microvascular ischemic disease. Electronically Signed   By: Rise MuBenjamin  McClintock M.D.   On: 09/06/2016 21:44     Scheduled Meds: . aspirin EC  81 mg Oral Daily  . budesonide (PULMICORT) nebulizer solution  0.25 mg Nebulization BID  . carvedilol  12.5 mg Oral BID  WC  . dicyclomine  20 mg Oral TID AC  . DULoxetine  60 mg Oral QHS  . ferrous sulfate  325 mg Oral BID WC  . gabapentin  300 mg Oral QHS  . hydrALAZINE  25 mg Oral TID  . insulin aspart  0-5 Units Subcutaneous QHS  . insulin aspart  0-9 Units Subcutaneous TID WC  . ipratropium-albuterol  3 mL Nebulization BID  . isosorbide mononitrate  15 mg Oral Daily  . levofloxacin  500 mg Oral Q48H  . levothyroxine  75 mcg Oral QAC breakfast  . multivitamin with minerals  1 tablet Oral Daily  . pantoprazole  20 mg Oral Daily  . polyethylene glycol  17 g Oral Daily  . simvastatin  40 mg Oral QPM  . tiZANidine  2 mg Oral Daily  . torsemide  40 mg Oral Daily  . warfarin  5 mg Oral ONCE-1800  . Warfarin - Pharmacist Dosing Inpatient   Does not apply q1800   Continuous Infusions:   Pamella Pert, MD, PhD [facility] Pager [phone]  If 7PM-7AM, please contact night-coverage [email or web address] Password [email or web address] 09/08/2016, 12:40 PM

## 2016-09-08 NOTE — Progress Notes (Signed)
Occupational Therapy Treatment Patient Details Name: [name] MRN: [id number] DOB: 06/15/1946 Today's Date: 09/08/2016    History of present illness pt presents with R LE DVT and found to have R 5th phalanx subacute fx.  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     OT comments  Pt initially reluctant to participate in therapy but once sitting EOB she was agreeable to toilet transfer. Pt able to perform stand pivot transfer to [facility] with min assist and cues for safety. Pt able to perform grooming activities in sitting and peri care with lateral lean with supervision for safety. Discussed post acute rehab for follow up and pt is agreeable. Continue to feel SNF for continued rehab is safest d/c option at this time. Will continue to follow acutely.   Follow Up Recommendations  SNF    Equipment Recommendations  Other (comment) (TBD at next venue)    Recommendations for Other Services      Precautions / Restrictions Precautions Precautions: Fall Precaution Comments: Chronic Trach Restrictions Weight Bearing Restrictions: No       Mobility Bed Mobility Overal bed mobility: Needs Assistance Bed Mobility: Supine to Sit Rolling: Supervision         General bed mobility comments: Supervision for safety; no physical assist required. HOB slightly elevated, no use of rails. Increased time required.  Transfers Overall transfer level: Needs assistance Equipment used: 1 person hand held assist Transfers: Sit to/from [employer]Stand;Stand Pivot Transfers Sit to Stand: Min assist Stand pivot transfers: Min assist       General transfer comment: Cues for hand placement and technique. Min assist to boost up and for steadying balance; pt able to take small steps to complete pivot and side step to [facility] for repositioning.    Balance Overall balance assessment: Needs assistance Sitting-balance support: Feet  unsupported;No upper extremity supported Sitting balance-Leahy Scale: Good     Standing balance support: Bilateral upper extremity supported Standing balance-Leahy Scale: Poor                     ADL Overall ADL's : Needs assistance/impaired Eating/Feeding: Set up;Sitting   Grooming: Set up;Supervision/safety;Wash/dry hands;Wash/dry face;Sitting                   Toilet Transfer: Minimal assistance;Stand-pivot;BSC   PsychiatristToileting- Clothing Manipulation and Hygiene: Supervision/safety;Sitting/lateral lean Toileting - Clothing Manipulation Details (indicate cue type and reason): for peri care only     Functional mobility during ADLs: Minimal assistance (for stand pivot only) General ADL Comments: Pt reluctant to participate in therapy but agreeable to perform toilet transfer once sitting EOB. Discussed post acute rehab with pt and she is agreeable. SpO2 down to 83% on 6L via trach collar; encouraged breathing strategies and SpO2 returned to mid 90s within 30 seconds.      Vision                     Perception     Praxis      Cognition   Behavior During Therapy: [facility] for tasks assessed/performed Overall Cognitive Status: Within Functional Limits for tasks assessed                       Extremity/Trunk Assessment               Exercises     Shoulder Instructions  General Comments      Pertinent Vitals/ Pain       Pain Assessment: No/denies pain  Home Living                                          Prior Functioning/Environment              Frequency  Min 2X/week        Progress Toward Goals  OT Goals(current goals can now be found in the care plan section)  Progress towards OT goals: Progressing toward goals  Acute Rehab OT Goals Patient Stated Goal: get back to being more independent OT Goal Formulation: With patient  Plan Discharge plan remains appropriate    Co-evaluation                  End of Session Equipment Utilized During Treatment: Oxygen   Activity Tolerance Patient tolerated treatment well   Patient Left with call bell/phone within reach;with bed alarm set;Other (comment) (sitting EOB eating lunch)   Nurse Communication Mobility status;Other (comment) (pt sitting EOB eating lunch)        Time: [id number] OT Time Calculation (min): 14 min  Charges: OT General Charges $OT Visit: 1 Procedure OT Treatments $Self Care/Home Management : 8-22 mins  Gaye AlkenBailey A Kwame Ryland M.S., OTR/L Pager: [phone]  09/08/2016, 12:22 PM

## 2016-09-08 NOTE — Progress Notes (Signed)
ATC setup changed 

## 2016-09-08 NOTE — Progress Notes (Signed)
ANTICOAGULATION CONSULT NOTE  Pharmacy Consult for warfarin Indication: DVT   Assessment: [age] yof presented to the ED with leg pain and found to have an acute DVT. Started warfarin with lovenox bridge - will d/c lovenox 11/16 - completed 5 days of overlap and INR therapeutic x2. INR up 2.62 this AM. CBC stable overnight with no bleeding noted.  Noted patient had a fall 11/11, but no injuries reported. Also, started on levaquin PO 11/16 for UTI - may affect INR.  Goal of Therapy:  INR 2-3 Monitor platelets by anticoagulation protocol: Yes   Plan:  - Warfarin 5mg  x1 dose tonight - D/c Lovenox - Daily INR - Monitor CBC, s/sx bleeding   Allergies  Allergen Reactions  . Aldactone [Spironolactone] Other (See Comments)    Severe hyperkalemia   . Lisinopril Other (See Comments) and Cough    Hypotension also  . Crestor [Rosuvastatin Calcium] Other (See Comments)    Muscle Pain  . Vicodin [Hydrocodone-Acetaminophen] Nausea And Vomiting    Patient Measurements: Height: 5\' 2"  (157.5 cm) Weight: 161 lb 2.5 oz (73.1 kg) IBW/kg (Calculated) : 50.1  Vital Signs: Temp: 98.2 F (36.8 C) (11/16 0345) Temp Source: Oral (11/16 0345) BP: 149/54 (11/16 0345) Pulse Rate: 82 (11/16 0906)  Labs:  Recent Labs  09/06/16 0253 09/06/16 0740 09/07/16 0443 09/08/16 0252  HGB  --  10.6* 10.4*  --   HCT  --  33.1* 33.4*  --   PLT  --  212 252  --   LABPROT 19.9*  --  24.7* 28.6*  INR 1.67  --  2.19 2.62  CREATININE  --  1.46* 1.43*  --     Estimated Creatinine Clearance: 34.3 mL/min (by C-G formula based on SCr of 1.43 mg/dL (H)).   Babs BertinHaley Patric Vanpelt, PharmD, BCPS Clinical Pharmacist 09/08/2016 10:40 AM

## 2016-09-08 NOTE — Progress Notes (Signed)
Increased fio2 on ATC to 30% due to desat, now at 93%, prev in the low 80's

## 2016-09-08 NOTE — Clinical Social Work Note (Signed)
Clinical Social Worker continuing to follow patient and family for support and discharge planning needs.  CSW met with patient at bedside to provide bed offers and patient has chosen [facility].  CSW contacted facility who is agreeable with discharge tomorrow.  Patient plans to notify family and would like to transport via ambulance.  CSW remains available for support and to facilitate patient discharge needs once medically ready.  Barbette Or, [name]

## 2016-09-09 DIAGNOSIS — J449 Chronic obstructive pulmonary disease, unspecified: Secondary | ICD-10-CM

## 2016-09-09 DIAGNOSIS — E039 Hypothyroidism, unspecified: Secondary | ICD-10-CM

## 2016-09-09 DIAGNOSIS — E7800 Pure hypercholesterolemia, unspecified: Secondary | ICD-10-CM

## 2016-09-09 DIAGNOSIS — I5042 Chronic combined systolic (congestive) and diastolic (congestive) heart failure: Secondary | ICD-10-CM

## 2016-09-09 DIAGNOSIS — Z8719 Personal history of other diseases of the digestive system: Secondary | ICD-10-CM

## 2016-09-09 DIAGNOSIS — S76012A Strain of muscle, fascia and tendon of left hip, initial encounter: Secondary | ICD-10-CM

## 2016-09-09 DIAGNOSIS — J386 Stenosis of larynx: Secondary | ICD-10-CM

## 2016-09-09 LAB — CBC
HCT: 32.9 % — ABNORMAL LOW (ref 36.0–46.0)
Hemoglobin: 10.4 g/dL — ABNORMAL LOW (ref 12.0–15.0)
MCH: 29.7 pg (ref 26.0–34.0)
MCHC: 31.6 g/dL (ref 30.0–36.0)
MCV: 94 fL (ref 78.0–100.0)
PLATELETS: 252 10*3/uL (ref 150–400)
RBC: 3.5 MIL/uL — ABNORMAL LOW (ref 3.87–5.11)
RDW: 17.2 % — AB (ref 11.5–15.5)
WBC: 9.4 10*3/uL (ref 4.0–10.5)

## 2016-09-09 LAB — BASIC METABOLIC PANEL
Anion gap: 11 (ref 5–15)
BUN: 33 mg/dL — AB (ref 6–20)
CALCIUM: 8.4 mg/dL — AB (ref 8.9–10.3)
CO2: 30 mmol/L (ref 22–32)
Chloride: 97 mmol/L — ABNORMAL LOW (ref 101–111)
Creatinine, Ser: 1.73 mg/dL — ABNORMAL HIGH (ref 0.44–1.00)
GFR calc Af Amer: 33 mL/min — ABNORMAL LOW (ref >60)
GFR, EST NON AFRICAN AMERICAN: 29 mL/min — AB (ref >60)
GLUCOSE: 172 mg/dL — AB (ref 65–99)
Potassium: 3.9 mmol/L (ref 3.5–5.1)
Sodium: 138 mmol/L (ref 135–145)

## 2016-09-09 LAB — URINE CULTURE: Culture: >=100000 — AB

## 2016-09-09 LAB — PROTIME-INR
INR: 3.47
Prothrombin Time: 35.8 seconds — ABNORMAL HIGH (ref 11.4–15.2)

## 2016-09-09 LAB — GLUCOSE, CAPILLARY
Glucose-Capillary: 192 mg/dL — ABNORMAL HIGH (ref 65–99)
Glucose-Capillary: 236 mg/dL — ABNORMAL HIGH (ref 65–99)

## 2016-09-09 MED ORDER — ALBUTEROL SULFATE (2.5 MG/3ML) 0.083% IN NEBU: 2.5000 mg | INHALATION_SOLUTION | RESPIRATORY_TRACT | 12 refills | Status: AC | PRN

## 2016-09-09 MED ORDER — CEPHALEXIN 250 MG PO CAPS
250.0000 mg | ORAL_CAPSULE | Freq: Two times a day (BID) | ORAL | Status: DC
  Administered 2016-09-09: 250 mg via ORAL
  Filled 2016-09-09 (×3): qty 1

## 2016-09-09 MED ORDER — TRAMADOL HCL 50 MG PO TABS: 50.0000 mg | ORAL_TABLET | Freq: Four times a day (QID) | ORAL | 0 refills | Status: DC | PRN

## 2016-09-09 MED ORDER — HYDROCODONE-ACETAMINOPHEN 5-325 MG PO TABS: 1.0000 | ORAL_TABLET | ORAL | 0 refills | Status: DC | PRN

## 2016-09-09 MED ORDER — CEPHALEXIN 250 MG PO CAPS: 250.0000 mg | ORAL_CAPSULE | Freq: Two times a day (BID) | ORAL | 0 refills | Status: DC

## 2016-09-09 MED ORDER — WARFARIN SODIUM 5 MG PO TABS: 5.0000 mg | ORAL_TABLET | Freq: Every day | ORAL | 0 refills | Status: DC

## 2016-09-09 MED ORDER — BUDESONIDE 0.25 MG/2ML IN SUSP: 0.2500 mg | Freq: Two times a day (BID) | RESPIRATORY_TRACT | 12 refills | Status: AC

## 2016-09-09 MED ORDER — ACETAMINOPHEN 325 MG PO TABS: 650.0000 mg | ORAL_TABLET | ORAL | 0 refills | Status: AC | PRN

## 2016-09-09 MED ORDER — TIZANIDINE HCL 2 MG PO TABS: 2.0000 mg | ORAL_TABLET | Freq: Every day | ORAL | 0 refills | Status: AC

## 2016-09-09 NOTE — Progress Notes (Signed)
[facility]

## 2016-09-09 NOTE — Progress Notes (Signed)
ANTICOAGULATION CONSULT NOTE  Pharmacy Consult for warfarin Indication: DVT   Assessment: [age] yof presented to the ED with leg pain and found to have an acute DVT. Started warfarin with lovenox bridge - d/c lovenox 11/16 - completed 5 days of overlap and INR therapeutic x2. INR with large jump to 3.47 this AM - likely due to interaction with levofloxacin, added 11/16. CBC stable with no bleeding noted.  Noted patient had a fall 11/11, but no injuries reported. Noted, started on levaquin PO 11/16 for UTI - INR may be difficult to control.  Goal of Therapy:  INR 2-3 Monitor platelets by anticoagulation protocol: Yes   Plan:  - Hold warfarin tonight (INR elevated, likely due to addition of levofloxacin) - Daily INR - Monitor CBC, s/sx bleeding   Allergies  Allergen Reactions  . Aldactone [Spironolactone] Other (See Comments)    Severe hyperkalemia   . Lisinopril Other (See Comments) and Cough    Hypotension also  . Crestor [Rosuvastatin Calcium] Other (See Comments)    Muscle Pain  . Vicodin [Hydrocodone-Acetaminophen] Nausea And Vomiting    Patient Measurements: Height: 5\' 2"  (157.5 cm) Weight: 161 lb 6 oz (73.2 kg) IBW/kg (Calculated) : 50.1  Vital Signs: Temp: 98.4 F (36.9 C) (11/17 0432) Temp Source: Oral (11/17 0432) BP: 128/59 (11/17 1020) Pulse Rate: 78 (11/17 1020)  Labs:  Recent Labs  09/07/16 0443 09/08/16 0252 09/09/16 0317  HGB 10.4*  --  10.4*  HCT 33.4*  --  32.9*  PLT 252  --  252  LABPROT 24.7* 28.6* 35.8*  INR 2.19 2.62 3.47  CREATININE 1.43*  --  1.73*    Estimated Creatinine Clearance: 28.3 mL/min (by C-G formula based on SCr of 1.73 mg/dL (H)).   Babs BertinHaley Hala Narula, PharmD, BCPS Clinical Pharmacist 09/09/2016 10:27 AM

## 2016-09-09 NOTE — Discharge Summary (Signed)
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 N/V, or Abdominal Pain.   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 Diminished bilaterally but no wheezing, no rhonchi Abdominal: Soft, NT, ND, bowel sounds + Extremities: no edema, no cyanosis; Right leg tenderness to calf squeeze.  The results of significant diagnostics from this hospitalization (including imaging, microbiology, ancillary and  laboratory) are listed below for reference.    Microbiology: Recent Results (from the past 240 hour(s))  Culture, Urine     Status: Abnormal   Collection Time: 09/07/16 11:43 AM  Result Value Ref Range Status   Specimen Description URINE, RANDOM  Final   Special Requests NONE  Final   Culture >=100,000 COLONIES/mL KLEBSIELLA PNEUMONIAE (A)  Final   Report Status 09/09/2016 FINAL  Final   Organism ID, Bacteria KLEBSIELLA PNEUMONIAE (A)  Final      Susceptibility   Klebsiella pneumoniae - MIC*    AMPICILLIN >=32 RESISTANT Resistant     CEFAZOLIN <=4 SENSITIVE Sensitive     CEFTRIAXONE <=1 SENSITIVE Sensitive     CIPROFLOXACIN <=0.25 SENSITIVE Sensitive     GENTAMICIN <=1 SENSITIVE Sensitive  IMIPENEM <=0.25 SENSITIVE Sensitive     NITROFURANTOIN 32 SENSITIVE Sensitive     TRIMETH/SULFA <=20 SENSITIVE Sensitive     AMPICILLIN/SULBACTAM 4 SENSITIVE Sensitive     PIP/TAZO <=4 SENSITIVE Sensitive     Extended ESBL NEGATIVE Sensitive     * >=100,000 COLONIES/mL KLEBSIELLA PNEUMONIAE     Labs: BNP (last 3 results)  Recent Labs  04/11/16 1522 05/27/16 1020 09/02/16 1139  BNP 623.6* 904.6* 698.3*   Basic Metabolic Panel:  Recent Labs Lab 09/02/16 1541  09/04/16 0135 09/05/16 0208 09/06/16 0740 09/07/16 0443 09/09/16 0317  NA  --   < > 138 139 137 138 138  K  --   < > 4.1 4.6 4.4 4.2 3.9  CL  --   < > 101 100* 100* 96* 97*  CO2  --   < > 28 32 28 32 30  GLUCOSE  --   < > 110* 142* 203* 173* 172*  BUN  --   < > 37* 37* 35* 32* 33*  CREATININE  --   < > 1.94* 1.70* 1.46* 1.43* 1.73*  CALCIUM  --   < > 8.7* 8.8* 8.8* 8.9 8.4*  MG 2.2  --   --   --   --   --   --   PHOS 4.4  --   --   --   --   --   --   < > = values in this interval not displayed. Liver Function Tests:  Recent Labs Lab 09/03/16 0229  AST 19  ALT 18  ALKPHOS 71  BILITOT 0.6  PROT 5.8*  ALBUMIN 3.1*   No results for input(s): LIPASE, AMYLASE in the last 168 hours.  Recent Labs Lab  09/06/16 1421  AMMONIA 14   CBC:  Recent Labs Lab 09/03/16 0229 09/04/16 0135 09/06/16 0740 09/07/16 0443 09/09/16 0317  WBC 7.7 9.4 9.2 10.7* 9.4  HGB 9.5* 9.6* 10.6* 10.4* 10.4*  HCT 30.6* 30.7* 33.1* 33.4* 32.9*  MCV 95.6 94.8 93.8 94.4 94.0  PLT 180 200 212 252 252   Cardiac Enzymes: No results for input(s): CKTOTAL, CKMB, CKMBINDEX, TROPONINI in the last 168 hours. BNP: Invalid input(s): POCBNP CBG:  Recent Labs Lab 09/08/16 1133 09/08/16 1619 09/08/16 2109 09/09/16 0607 09/09/16 1116  GLUCAP 175* 229* 156* 192* 236*   D-Dimer No results for input(s): DDIMER in the last 72 hours. Hgb A1c No results for input(s): HGBA1C in the last 72 hours. Lipid Profile No results for input(s): CHOL, HDL, LDLCALC, TRIG, CHOLHDL, LDLDIRECT in the last 72 hours. Thyroid function studies No results for input(s): TSH, T4TOTAL, T3FREE, THYROIDAB in the last 72 hours.  Invalid input(s): FREET3 Anemia work up No results for input(s): VITAMINB12, FOLATE, FERRITIN, TIBC, IRON, RETICCTPCT in the last 72 hours. Urinalysis    Component Value Date/Time   COLORURINE YELLOW 09/07/2016 0652   APPEARANCEUR CLOUDY (A) 09/07/2016 0652   LABSPEC 1.011 09/07/2016 0652   PHURINE 6.0 09/07/2016 0652   GLUCOSEU NEGATIVE 09/07/2016 0652   HGBUR NEGATIVE 09/07/2016 0652   BILIRUBINUR NEGATIVE 09/07/2016 0652   KETONESUR NEGATIVE 09/07/2016 0652   PROTEINUR NEGATIVE 09/07/2016 0652   UROBILINOGEN 0.2 07/27/2015 1650   NITRITE NEGATIVE 09/07/2016 0652   LEUKOCYTESUR SMALL (A) 09/07/2016 0652   Sepsis Labs Invalid input(s): PROCALCITONIN,  WBC,  LACTICIDVEN Microbiology Recent Results (from the past 240 hour(s))  Culture, Urine     Status: Abnormal   Collection Time: 09/07/16 11:43 AM  Result Value Ref  Range Status   Specimen Description URINE, RANDOM  Final   Special Requests NONE  Final   Culture >=100,000 COLONIES/mL KLEBSIELLA PNEUMONIAE (A)  Final   Report Status 09/09/2016 FINAL   Final   Organism ID, Bacteria KLEBSIELLA PNEUMONIAE (A)  Final      Susceptibility   Klebsiella pneumoniae - MIC*    AMPICILLIN >=32 RESISTANT Resistant     CEFAZOLIN <=4 SENSITIVE Sensitive     CEFTRIAXONE <=1 SENSITIVE Sensitive     CIPROFLOXACIN <=0.25 SENSITIVE Sensitive     GENTAMICIN <=1 SENSITIVE Sensitive     IMIPENEM <=0.25 SENSITIVE Sensitive     NITROFURANTOIN 32 SENSITIVE Sensitive     TRIMETH/SULFA <=20 SENSITIVE Sensitive     AMPICILLIN/SULBACTAM 4 SENSITIVE Sensitive     PIP/TAZO <=4 SENSITIVE Sensitive     Extended ESBL NEGATIVE Sensitive     * >=100,000 COLONIES/mL KLEBSIELLA PNEUMONIAE   Time coordinating discharge: Over 30 minutes  SIGNED:  Merlene Laughter, DO [facility] 09/09/2016, 1:45 PM Pager [phone]  If 7PM-7AM, please contact night-coverage [email or web address] Password [email or web address]

## 2016-09-09 NOTE — Progress Notes (Addendum)
Calling to give report no answer [facility] hear for patient Pt. [facility] D/C'd via [facility] w  All personal belongings given to Pt.   IV was d/c Tele d/c

## 2016-09-09 NOTE — Care Management Note (Signed)
Case Management Note Previous CM note initiated by [name], Camellia, RN 09/02/2016, 10:47 AM- in ED  Patient Details  Name: [name] MRN: [id number] Date of Birth: 10/12/1946  Subjective/Objective:                  70 y.o. female with a PMHx of CAD, arthritis, CHF, COPD, CKD, and HTN who presents to the Emergency Department complaining of constant, unchanged R leg pain onset yesterday morning. From home.  Action/Plan: Follow for disposition needs.   Expected Discharge Date:  09/09/16          Expected Discharge Plan:  Skilled Nursing Facility  In-House Referral:  Clinical Social Work  Discharge planning Services  CM Consult  Post Acute Care Choice:  Resumption of Svcs/PTA Provider, Home Health Choice offered to:  Patient  DME Arranged:  N/A DME Agency:  NA  HH Arranged:  PT HH Agency:  [facility]  Status of Service:  Completed, signed off  If discussed at Long Length of Stay Meetings, dates discussed:  11/16  Additional Comments:  09/09/16- 1430- Cosimo Schertzer RN, CM- pt for d/c today- [facility]- CSW following for placement needs- Cly[facility]Karen [facility].   Pt currently active with [facility] for RN services.  Resumption of care requested. Avie EchevariaKaren Nussbaum, RN of [facility] notified.  No DME needs identified at this time.  EDCM advised EDP to order reinstatement of HHPT and date for INR check and who to call results to. Camellia J. Lucretia RoersWood, RN, BSN, [place] [phone]   Darrold SpanWebster, Terre Zabriskie Hall, RN 09/09/2016, 2:49 PM [phone]

## 2016-09-09 NOTE — Clinical Social Work Note (Signed)
CSW facilitated patient discharge including contacting facility to confirm patient discharge plans. Patient declined having family called. Clinical information faxed to facility and family agreeable with plan. CSW arranged [facility]. RN to call report prior to discharge [phone]).  CSW will sign off for now as social work intervention is no longer needed. Please consult us again if new needs arise.  Charlynn CourtSarah Rafia Shedden, CSW [phone]

## 2016-09-09 NOTE — Clinical Social Work Placement (Signed)
   CLINICAL SOCIAL WORK PLACEMENT  NOTE  Date:  09/09/2016  Patient Details  Name: [name] MRN: [id number] Date of Birth: 08/02/1946  Clinical Social Work is seeking post-discharge placement for this patient at the Skilled  Nursing Facility level of care (*CSW will initial, date and re-position this form in  chart as items are completed):  Yes   Patient/family provided with [facility] Clinical Social Work Department's list of facilities offering this level of care within the geographic area requested by the patient (or if unable, by the patient's family).  Yes   Patient/family informed of their freedom to choose among providers that offer the needed level of care, that participate in Medicare, Medicaid or managed care program needed by the patient, have an available bed and are willing to accept the patient.  Yes   Patient/family informed of [facility]'s ownership interest in [facility] and [facility], as well as of the fact that they are under no obligation to receive care at these facilities.  PASRR submitted to EDS on 09/05/16     PASRR number received on       Existing PASRR number confirmed on 09/05/16     FL2 transmitted to all facilities in geographic area requested by pt/family on 09/05/16     FL2 transmitted to all facilities within larger geographic area on       Patient informed that his/her managed care company has contracts with or will negotiate with certain facilities, including the following:        Yes   Patient/family informed of bed offers received.  [facility]     Physician recommends and patient chooses bed at      [facility]  Patient to be transferred to facility by [facility]     Patient family notified on 09/09/16 of transfer.  Name of family member notified:  Declined     PHYSICIAN       Additional Comment:     _______________________________________________ Margarito LinerSarah C Demia Viera, LCSW 09/09/2016, 2:38 PM

## 2016-09-09 NOTE — Progress Notes (Addendum)
PT Cancellation Note  Patient Details Name: Scot JunDorothy I Schartz MRN: [id number] DOB: 05/28/1946   Cancelled Treatment:    Reason Eval/Treat Not Completed: Patient at procedure or test/unavailable. [facility] then planning on eating lunch after that.  Will check back as schedule permits.   ADDENDUM: Checked back and pt reports she is hurting too bad and just had pain medicine.  She is scheduled to d/c this afternoon.  Will check tomorrow in case d/c in cancelled for some reason.  Wilbert Schouten LUBECK 09/09/2016, 12:33 PM

## 2016-09-09 NOTE — Care Management Important Message (Signed)
Important Message  Patient Details  Name: [name] MRN: [id number] Date of Birth: 05/09/1946   Medicare Important Message Given:  Yes    Kyla BalzarineShealy, Regina Ganci Abena 09/09/2016, 9:58 AM

## 2016-09-19 ENCOUNTER — Encounter (HOSPITAL_COMMUNITY): Payer: Self-pay | Admitting: Emergency Medicine

## 2016-09-19 ENCOUNTER — Inpatient Hospital Stay (HOSPITAL_COMMUNITY)
Admission: EM | Admit: 2016-09-19 | Discharge: 2016-10-07 | DRG: 853 | Disposition: A | Discharge status: Skilled Nursing Facility | Payer: Medicare Other | Attending: Internal Medicine | Admitting: Internal Medicine

## 2016-09-19 DIAGNOSIS — Z7901 Long term (current) use of anticoagulants: Secondary | ICD-10-CM

## 2016-09-19 DIAGNOSIS — E1165 Type 2 diabetes mellitus with hyperglycemia: Secondary | ICD-10-CM | POA: Diagnosis present

## 2016-09-19 DIAGNOSIS — E11649 Type 2 diabetes mellitus with hypoglycemia without coma: Secondary | ICD-10-CM | POA: Diagnosis present

## 2016-09-19 DIAGNOSIS — G928 Other toxic encephalopathy: Secondary | ICD-10-CM | POA: Diagnosis present

## 2016-09-19 DIAGNOSIS — Z87891 Personal history of nicotine dependence: Secondary | ICD-10-CM

## 2016-09-19 DIAGNOSIS — I5042 Chronic combined systolic (congestive) and diastolic (congestive) heart failure: Secondary | ICD-10-CM | POA: Diagnosis present

## 2016-09-19 DIAGNOSIS — J398 Other specified diseases of upper respiratory tract: Secondary | ICD-10-CM | POA: Diagnosis present

## 2016-09-19 DIAGNOSIS — Z8673 Personal history of transient ischemic attack (TIA), and cerebral infarction without residual deficits: Secondary | ICD-10-CM

## 2016-09-19 DIAGNOSIS — Z86718 Personal history of other venous thrombosis and embolism: Secondary | ICD-10-CM

## 2016-09-19 DIAGNOSIS — Z79899 Other long term (current) drug therapy: Secondary | ICD-10-CM

## 2016-09-19 DIAGNOSIS — Z888 Allergy status to other drugs, medicaments and biological substances status: Secondary | ICD-10-CM

## 2016-09-19 DIAGNOSIS — K219 Gastro-esophageal reflux disease without esophagitis: Secondary | ICD-10-CM | POA: Diagnosis present

## 2016-09-19 DIAGNOSIS — Z9842 Cataract extraction status, left eye: Secondary | ICD-10-CM

## 2016-09-19 DIAGNOSIS — T402X1A Poisoning by other opioids, accidental (unintentional), initial encounter: Secondary | ICD-10-CM | POA: Diagnosis present

## 2016-09-19 DIAGNOSIS — E118 Type 2 diabetes mellitus with unspecified complications: Secondary | ICD-10-CM

## 2016-09-19 DIAGNOSIS — R4182 Altered mental status, unspecified: Secondary | ICD-10-CM | POA: Diagnosis not present

## 2016-09-19 DIAGNOSIS — D62 Acute posthemorrhagic anemia: Secondary | ICD-10-CM | POA: Diagnosis not present

## 2016-09-19 DIAGNOSIS — J449 Chronic obstructive pulmonary disease, unspecified: Secondary | ICD-10-CM | POA: Diagnosis present

## 2016-09-19 DIAGNOSIS — I82401 Acute embolism and thrombosis of unspecified deep veins of right lower extremity: Secondary | ICD-10-CM | POA: Diagnosis present

## 2016-09-19 DIAGNOSIS — I708 Atherosclerosis of other arteries: Secondary | ICD-10-CM | POA: Diagnosis present

## 2016-09-19 DIAGNOSIS — M6281 Muscle weakness (generalized): Secondary | ICD-10-CM

## 2016-09-19 DIAGNOSIS — I272 Pulmonary hypertension, unspecified: Secondary | ICD-10-CM | POA: Diagnosis present

## 2016-09-19 DIAGNOSIS — A408 Other streptococcal sepsis: Principal | ICD-10-CM | POA: Diagnosis present

## 2016-09-19 DIAGNOSIS — Z7982 Long term (current) use of aspirin: Secondary | ICD-10-CM

## 2016-09-19 DIAGNOSIS — Z8249 Family history of ischemic heart disease and other diseases of the circulatory system: Secondary | ICD-10-CM

## 2016-09-19 DIAGNOSIS — Z885 Allergy status to narcotic agent status: Secondary | ICD-10-CM

## 2016-09-19 DIAGNOSIS — I130 Hypertensive heart and chronic kidney disease with heart failure and stage 1 through stage 4 chronic kidney disease, or unspecified chronic kidney disease: Secondary | ICD-10-CM | POA: Diagnosis present

## 2016-09-19 DIAGNOSIS — E1122 Type 2 diabetes mellitus with diabetic chronic kidney disease: Secondary | ICD-10-CM | POA: Diagnosis present

## 2016-09-19 DIAGNOSIS — I700 Atherosclerosis of aorta: Secondary | ICD-10-CM | POA: Diagnosis present

## 2016-09-19 DIAGNOSIS — Z9981 Dependence on supplemental oxygen: Secondary | ICD-10-CM

## 2016-09-19 DIAGNOSIS — B961 Klebsiella pneumoniae [K. pneumoniae] as the cause of diseases classified elsewhere: Secondary | ICD-10-CM | POA: Diagnosis present

## 2016-09-19 DIAGNOSIS — Z9841 Cataract extraction status, right eye: Secondary | ICD-10-CM

## 2016-09-19 DIAGNOSIS — N390 Urinary tract infection, site not specified: Secondary | ICD-10-CM | POA: Diagnosis present

## 2016-09-19 DIAGNOSIS — T17990A Other foreign object in respiratory tract, part unspecified in causing asphyxiation, initial encounter: Secondary | ICD-10-CM | POA: Diagnosis not present

## 2016-09-19 DIAGNOSIS — M19042 Primary osteoarthritis, left hand: Secondary | ICD-10-CM | POA: Diagnosis present

## 2016-09-19 DIAGNOSIS — I252 Old myocardial infarction: Secondary | ICD-10-CM

## 2016-09-19 DIAGNOSIS — I70321 Atherosclerosis of unspecified type of bypass graft(s) of the extremities with rest pain, right leg: Secondary | ICD-10-CM

## 2016-09-19 DIAGNOSIS — E162 Hypoglycemia, unspecified: Secondary | ICD-10-CM | POA: Diagnosis present

## 2016-09-19 DIAGNOSIS — Z951 Presence of aortocoronary bypass graft: Secondary | ICD-10-CM

## 2016-09-19 DIAGNOSIS — I824Z1 Acute embolism and thrombosis of unspecified deep veins of right distal lower extremity: Secondary | ICD-10-CM

## 2016-09-19 DIAGNOSIS — I2510 Atherosclerotic heart disease of native coronary artery without angina pectoris: Secondary | ICD-10-CM | POA: Diagnosis present

## 2016-09-19 DIAGNOSIS — Z833 Family history of diabetes mellitus: Secondary | ICD-10-CM

## 2016-09-19 DIAGNOSIS — I10 Essential (primary) hypertension: Secondary | ICD-10-CM | POA: Diagnosis present

## 2016-09-19 DIAGNOSIS — G92 Toxic encephalopathy: Secondary | ICD-10-CM | POA: Diagnosis present

## 2016-09-19 DIAGNOSIS — R0602 Shortness of breath: Secondary | ICD-10-CM

## 2016-09-19 DIAGNOSIS — R52 Pain, unspecified: Secondary | ICD-10-CM

## 2016-09-19 DIAGNOSIS — E039 Hypothyroidism, unspecified: Secondary | ICD-10-CM | POA: Diagnosis present

## 2016-09-19 DIAGNOSIS — N183 Chronic kidney disease, stage 3 (moderate): Secondary | ICD-10-CM | POA: Diagnosis present

## 2016-09-19 DIAGNOSIS — Z930 Tracheostomy status: Secondary | ICD-10-CM

## 2016-09-19 DIAGNOSIS — IMO0002 Reserved for concepts with insufficient information to code with codable children: Secondary | ICD-10-CM

## 2016-09-19 DIAGNOSIS — Z794 Long term (current) use of insulin: Secondary | ICD-10-CM

## 2016-09-19 DIAGNOSIS — I745 Embolism and thrombosis of iliac artery: Secondary | ICD-10-CM | POA: Diagnosis present

## 2016-09-19 DIAGNOSIS — E1151 Type 2 diabetes mellitus with diabetic peripheral angiopathy without gangrene: Secondary | ICD-10-CM | POA: Diagnosis present

## 2016-09-19 DIAGNOSIS — M19041 Primary osteoarthritis, right hand: Secondary | ICD-10-CM | POA: Diagnosis present

## 2016-09-19 DIAGNOSIS — Z820 Family history of epilepsy and other diseases of the nervous system: Secondary | ICD-10-CM

## 2016-09-19 DIAGNOSIS — J9611 Chronic respiratory failure with hypoxia: Secondary | ICD-10-CM | POA: Diagnosis present

## 2016-09-19 DIAGNOSIS — M5136 Other intervertebral disc degeneration, lumbar region: Secondary | ICD-10-CM | POA: Diagnosis present

## 2016-09-19 DIAGNOSIS — J9621 Acute and chronic respiratory failure with hypoxia: Secondary | ICD-10-CM | POA: Diagnosis not present

## 2016-09-19 DIAGNOSIS — Z961 Presence of intraocular lens: Secondary | ICD-10-CM | POA: Diagnosis present

## 2016-09-19 DIAGNOSIS — E785 Hyperlipidemia, unspecified: Secondary | ICD-10-CM | POA: Diagnosis present

## 2016-09-19 LAB — I-STAT ARTERIAL BLOOD GAS, ED
Acid-Base Excess: 2 mmol/L (ref 0.0–2.0)
Bicarbonate: 26.8 mmol/L (ref 20.0–28.0)
O2 Saturation: 97 %
PCO2 ART: 40.8 mmHg (ref 32.0–48.0)
PH ART: 7.42 (ref 7.350–7.450)
PO2 ART: 80 mmHg — AB (ref 83.0–108.0)
Patient temperature: 96.2
TCO2: 28 mmol/L (ref 0–100)

## 2016-09-19 LAB — CBG MONITORING, ED: Glucose-Capillary: 111 mg/dL — ABNORMAL HIGH (ref 65–99)

## 2016-09-19 NOTE — ED Provider Notes (Signed)
[facility]-EM[facility]Y DEPT Provider Note   CSN: [id number] Arrival date & time: 09/19/16  2255  By signing my name below, I, Vista Mink, attest that this documentation has been prepared under the direction and in the presence of Shon Baton, MD. Electronically signed, Vista Mink, ED Scribe. 09/19/16. 11:20 PM.  History   Chief Complaint Chief Complaint  Patient presents with  . Altered Mental Status    HPI: LEVEL 5 CAVEAT DUE TO AMS HPI Comments: [name] is a 70 y.o. female with Hx of MI. DM, CVA, tracheostomy placed, brought in by ambulance, who presents to the Emergency Department for altered mental status after being found mostly unresponsive less than one hour ago. Pt is from [facility] and was found only responding to painful stimuli. Pt's CBG on EMS arrival was 28, she received 25g oral glucose and 24g of D10 given by EMS. Her initial O2 sat on EMS arrival was 60% and tracheostomy fully clogged. Pt with Hx of DM controlled by insulin, last insulin given at approximately 1700, last seen well at 1800 this evening(5 hours ago). Pt is normally alert and oriented x4. Last CBG checked by EMS was 171 in route.     The history is provided by the nursing home and the EMS personnel. The history is limited by the condition of the patient. No language interpreter was used.    Past Medical History:  Diagnosis Date  . Anemia   . Arthritis    "hands, back" (06/28/2016)  . BRBPR (bright red blood per rectum) 06/28/2016  . CAD (coronary artery disease)    a. S/p CABG and stenting // b. Myoview 3/16: high risk EF 25%, ant-apical AK, ant scar with peri-infarct ischemia // c. LHC 3/16 - EF 45%, inferior hypokinesis, oLAD 100, oLCx 40, branch vessel 50, pRCA 100 dRCA , RV br 90, S-RCA occluded, S-LAD patent with inf-apical LAD beyond graft 70 >> med Rx  . Carotid artery disease (HCC)    a. s/p L CEA // b. Carotid US 4/16: R 40-59%; L CEA patent with 40-59%  . Chronic combined systolic  and diastolic CHF (congestive heart failure) (HCC)    a. 11/2011 Echo with EF 30-35%, global hypokinesis, and inferior akinesis;  b. 12/2014 Echo: EF 50%, mod LVH, Ao sclerosis w/o stenosis, mildly dil LA, mild to mod RV dysfxn. // c. Echo 10/16: Moderate LVH, EF 35-40%, inferior AK, grade 1 diastolic dysfunction, MAC, mild MR, moderate LAE  . Chronic lower back pain   . CKD (chronic kidney disease), stage III   . COPD (chronic obstructive pulmonary disease) (HCC)    a. prn and HS supplemental O2.  . DDD (degenerative disc disease), lumbar   . Depression   . GERD (gastroesophageal reflux disease)   . History of blood transfusion ~ 2015; 06/28/2016   "anemia"  . History of IBS   . History of tracheostomy 08/26/2013   Dr. Jenne Pane  . Hyperlipidemia   . Hypertension   . Hypothyroidism    Goiter  . MI (myocardial infarction) 1997  . On home oxygen therapy    "4L at night & prn during the day" (06/28/2016)  . PAD (peripheral artery disease) (HCC)   . Stroke [facility])    MRI 11/2011 with remote occipital lobe. MRA with moderate left focal vertebral artery stenosis  . Type II diabetes mellitus [facility])     Patient Active Problem List   Diagnosis Date Noted  . Altered mental status 09/20/2016  . Acute  deep vein thrombosis (DVT) (HCC) 09/02/2016  . Strain and tear of left hip adductor muscle and quadricep muscle 09/02/2016  . History of lower GI bleeding-presumed diverticular 06/28/2016  . Ischemic cardiomyopathy 04/11/2016  . AV block, 1st degree 11/15/2015  . CKD (chronic kidney disease) stage 4, GFR 15-29 ml/min (HCC) 11/10/2015  . Cancer screening 11/04/2015  . COPD, severe (HCC) 11/04/2015  . Chronic respiratory failure with hypoxia (HCC) 11/04/2015  . Status post tracheostomy (HCC) 11/04/2015  . Acute on chronic respiratory failure with hypoxia (HCC) 07/28/2015  . DM type 2 causing CKD stage 3 (HCC) 07/28/2015  . Abnormal EKG 07/27/2015  . Acute respiratory failure with hypoxia (HCC)  07/27/2015  . Chronic combined systolic and diastolic CHF (congestive heart failure) (HCC) 07/27/2015  . Acute pulmonary edema (HCC)   . Abnormal nuclear stress test   . Chronic systolic heart failure (HCC) 07/17/2014  . Chronic airway obstruction, not elsewhere classified 07/17/2014  . Atherosclerosis of native arteries of the extremities with intermittent claudication 01/16/2014  . Aftercare following surgery of the circulatory system, NEC 09/05/2013  . History of tracheostomy 08/26/2013  . Discoloration of skin- left foot 03/28/2013  . Peripheral vascular disease, unspecified (HCC) 03/28/2013  . Nonspecific abnormal electrocardiogram (ECG) (EKG)/QT prolongation 01/01/2013  . Anemia 10/21/2012  . Occlusion and stenosis of carotid artery without mention of cerebral infarction 02/02/2012  . 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 Diabetes mellitus type 2, uncontrolled (HCC) 11/26/2011  . Hypertension 11/26/2011  . Hyperlipidemia 11/26/2011  . History of CVA (cerebrovascular accident) 11/26/2011  . 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 Peripheral neuropathy (HCC) 11/26/2011  . History of MI (myocardial infarction)   . CAD (coronary artery disease)   . Chronic combined systolic and diastolic CHF, NYHA class 2 (EF 30-35%) per ECHO 2017   . S/P CABG (coronary artery bypass graft)   . PAD (peripheral artery disease) (HCC)   . Hx-TIA (transient ischemic attack) 01/24/2011    Past Surgical History:  Procedure Laterality Date  . ABDOMINAL AORTAGRAM N/A 04/05/2013   Procedure: ABDOMINAL Ronny FlurryAORTAGRAM;  Surgeon: Sherren Kernsharles E Fields, MD;  [facility];  Service: Cardiovascular;  Laterality: N/A;  . [id number]  [id number]  0815-2012   Aortogram by Dr. Italyhad McKenzie [facility]  . CAROTID ENDARTERECTOMY Left ~2008  . CARPAL TUNNEL RELEASE Left   . CATARACT EXTRACTION W/ INTRAOCULAR LENS IMPLANT & ANTERIOR VITRECTOMY, BILATERAL  ~2007-02/2016   "left-right"  .  CHOLECYSTECTOMY OPEN    . COLONOSCOPY N/A 10/27/2014   Procedure: COLONOSCOPY;  Surgeon: Willis ModenaWilliam Outlaw, MD;  [facility];  Service: Endoscopy;  Laterality: N/A;  . CORONARY ARTERY BYPASS GRAFT     2 vessel  . DILATION AND CURETTAGE OF UTERUS    . FEMORAL-TIBIAL BYPASS GRAFT Left 06/11/2013   Procedure: BYPASS GRAFT  LEFT FEMORAL- POSTERIOR TIBIAL ARTERY/ REDO;  Surgeon: Sherren Kernsharles E Fields, MD;  [facility];  Service: Vascular;  Laterality: Left;  . LEFT AND RIGHT HEART CATHETERIZATION WITH CORONARY ANGIOGRAM N/A 01/09/2015   Procedure: LEFT AND RIGHT HEART CATHETERIZATION WITH CORONARY ANGIOGRAM;  Surgeon: Marykay Lexavid W Harding, MD;  [facility];  Service: Cardiovascular;  Laterality: N/A;  . PR VEIN BYPASS GRAFT,AORTO-FEM-POP     Right common femoral-AK popliteal BPG & Right Popliteal-posterior tibial  . PR VEIN BYPASS GRAFT,AORTO-FEM-POP     Left Fem-pop BPG  . PTCA    . SPINE SURGERY  Oct. 27, 2014   Injection - Back  . THROMBECTOMY FEMORAL ARTERY  Left 06/11/2013   Procedure: THROMBECTOMY FEMORAL ARTERY;  Surgeon: Sherren Kernsharles E Fields, MD;  [facility];  Service: Vascular;  Laterality: Left;  . THYROIDECTOMY    . TRACHEOSTOMY TUBE PLACEMENT  01/02/2012  . TUBAL LIGATION      OB History    No data available       Home Medications    Prior to Admission medications   Medication Sig Start Date End Date Taking? Authorizing Provider  acetaminophen (TYLENOL) 325 MG tablet Take 2 tablets (650 mg total) by mouth every 4 (four) hours as needed for mild pain or fever (give with each dose Ultram). 09/09/16   Merlene Laughtermair Latif Sheikh, DO  albuterol (PROVENTIL) (2.5 MG/3ML) 0.083% nebulizer solution Take 3 mLs (2.5 mg total) by nebulization every 2 (two) hours as needed for wheezing or shortness of breath. 09/09/16   Merlene Laughtermair Latif Sheikh, DO  aspirin EC 81 MG tablet Take 81 mg by mouth daily.    Historical Provider, MD  budesonide (PULMICORT) 0.25 MG/2ML nebulizer solution Take 2 mLs  (0.25 mg total) by nebulization 2 (two) times daily. 09/09/16   Omair Latif Sheikh, DO  carvedilol (COREG) 12.5 MG tablet Take 1 tablet (12.5 mg total) by mouth 2 (two) times daily with a meal. 03/29/16   Jake BatheMark C Skains, MD  cephALEXin (KEFLEX) 250 MG capsule Take 1 capsule (250 mg total) by mouth every 12 (twelve) hours. 09/09/16   Omair Latif Sheikh, DO  dicyclomine (BENTYL) 20 MG tablet Take 20 mg by mouth 3 (three) times daily before meals.    Historical Provider, MD  DULoxetine (CYMBALTA) 60 MG capsule Take 60 mg by mouth at bedtime.  08/06/14   Historical Provider, MD  ferrous sulfate 325 (65 FE) MG tablet Take 325 mg by mouth daily with breakfast.    Historical Provider, MD  gabapentin (NEURONTIN) 300 MG capsule Take 1 capsule (300 mg total) by mouth at bedtime. 03/29/16   [name] Tuchman, DPM  glucose ([facility] GLUCOSE) 4 GM chewable tablet Chew 1 tablet by mouth daily as needed for low blood sugar.     Historical Provider, MD  hydrALAZINE (APRESOLINE) 25 MG tablet Take 1 tablet (25 mg total) by mouth 3 (three) times daily. 04/11/16   Beatrice LecherScott T Weaver, PA-C  HYDROcodone-acetaminophen (NORCO/VICODIN) 5-325 MG tablet Take 1 tablet by mouth every 4 (four) hours as needed for severe pain (give only if Ultram ineffective). 09/09/16   Omair Latif Sheikh, DO  Insulin Degludec (TRESIBA FLEXTOUCH) 100 UNIT/ML SOPN Inject 24 Units into the skin at bedtime.     Historical Provider, MD  ipratropium-albuterol (DUONEB) 0.5-2.5 (3) MG/3ML SOLN Take 3 mLs by nebulization 2 (two) times daily as needed (for shortnes of breath).     Historical Provider, MD  isosorbide mononitrate (IMDUR) 30 MG 24 hr tablet Take 15 mg by mouth daily.    Historical Provider, MD  lansoprazole (PREVACID) 30 MG capsule Take 30 mg by mouth 2 (two) times daily. 06/08/16   Historical Provider, MD  levothyroxine (SYNTHROID, LEVOTHROID) 75 MCG tablet Take 75 mcg by mouth daily before breakfast.  06/12/16   Historical Provider, MD  Multiple  Vitamins-Minerals (WOMENS 50+ MULTI VITAMIN/MIN) TABS Take 1 tablet by mouth daily.    Historical Provider, MD  nitroGLYCERIN (NITROSTAT) 0.4 MG SL tablet Place 0.4 mg under the tongue every 5 (five) minutes as needed for chest pain (x 3 doses). For chest pain    Historical Provider, MD  NOVOLOG FLEXPEN 100 UNIT/ML FlexPen 70 -199  TAKE 12 UNITS, 200-299 TAKE 14 UNITS, 300 OR HIGHER 16 UNITS SUBCUTANEOUS (12-16 units subcutaneously three (3) times a day) 10/01/15   Historical Provider, MD  OXYGEN-HELIUM IN Inhale 4 L into the lungs at bedtime.     Historical Provider, MD  polyethylene glycol (MIRALAX / GLYCOLAX) packet Take 17 g by mouth daily. Patient taking differently: Take 17 g by mouth daily as needed for mild constipation.  07/04/16   [name], MD  Probiotic Product (PROBIOTIC PO) Take 1 tablet by mouth daily.     Historical Provider, MD  senna-docusate (SENOKOT-S) 8.6-50 MG tablet Take 2 tablets by mouth at bedtime. Patient taking differently: Take 2 tablets by mouth at bedtime as needed for mild constipation.  07/04/16   [name], MD  simvastatin (ZOCOR) 40 MG tablet Take 40 mg by mouth every evening.  11/28/11   Danley Danker, MD  tiZANidine (ZANAFLEX) 2 MG tablet Take 1 tablet (2 mg total) by mouth daily. 09/09/16   Omair Latif Sheikh, DO  torsemide (DEMADEX) 20 MG tablet Take 40 mg by mouth daily.     Historical Provider, MD  traMADol (ULTRAM) 50 MG tablet Take 1 tablet (50 mg total) by mouth every 6 (six) hours as needed for moderate pain. 09/09/16   Merlene Laughter, DO  warfarin (COUMADIN) 5 MG tablet Take 1 tablet (5 mg total) by mouth daily. 09/09/16   Merlene Laughter, DO    Family History Family History  Problem Relation Age of Onset  . Hyperlipidemia Mother   . Other Mother     AAA  . Alzheimer's disease Mother   . Heart disease Mother   . Irregular heart beat Mother   . Diabetes Daughter   . Hypertension Daughter     Social History Social History  Substance Use  Topics  . Smoking status: Former Smoker    Packs/day: 1.00    Years: 50.00    Types: Cigarettes    Quit date: 01/24/2012  . Smokeless tobacco: Never Used  . Alcohol use No     Allergies   Aldactone [spironolactone]; Lisinopril; Crestor [rosuvastatin calcium]; and Vicodin [hydrocodone-acetaminophen]   Review of Systems Review of Systems  Unable to perform ROS: Mental status change   Physical Exam Updated Vital Signs BP 177/57   Pulse (!) 35   Temp (!) 96.2 F (35.7 C) (Rectal)   Resp 25   Ht 5\' 2"  (1.575 m)   Wt 161 lb (73 kg)   SpO2 99%   BMI 29.45 kg/m   Physical Exam  Constitutional:  Chronically ill-appearing, opens eyes to tactile stimuli  HENT:  Head: Normocephalic and atraumatic.  Eyes: Pupils are equal, round, and reactive to light.  Pupils 2 mm reactive bilaterally  Neck:  Tracheostomy in place  Cardiovascular: Normal rate, regular rhythm and normal heart sounds.   No murmur heard. Pulmonary/Chest: Effort normal. No respiratory distress. She has no wheezes.  Coarse breath sounds bilaterally, tracheostomy in place  Abdominal: Soft. Bowel sounds are normal. There is no tenderness. There is no guarding.  Musculoskeletal: She exhibits edema.  Neurological:  Somnolent, arousable to tactile stimuli, appears to move all 4 extremities spontaneously  Skin: Skin is warm and dry.  Nursing note and vitals reviewed.    ED Treatments / Results  DIAGNOSTIC STUDIES: Oxygen Saturation is 97% on RA, normal by my interpretation.  COORDINATION OF CARE: 11:17 PM-Pt not responding to verbal stimuli on initial provider contact.   Labs (all labs ordered are  listed, but only abnormal results are displayed) Labs Reviewed  CBC WITH DIFFERENTIAL/PLATELET - Abnormal; Notable for the following:       Result Value   WBC 15.5 (*)    RBC 3.40 (*)    Hemoglobin 9.8 (*)    HCT 30.8 (*)    RDW 17.0 (*)    Platelets 429 (*)    Neutro Abs 13.5 (*)    Lymphs Abs 0.5 (*)     Monocytes Absolute 1.4 (*)    All other components within normal limits  COMPREHENSIVE METABOLIC PANEL - Abnormal; Notable for the following:    Sodium 133 (*)    Potassium 6.1 (*)    Chloride 98 (*)    BUN 39 (*)    Creatinine, Ser 1.91 (*)    Calcium 8.7 (*)    Albumin 2.9 (*)    AST 67 (*)    Total Bilirubin 1.9 (*)    GFR calc non Af Amer 25 (*)    GFR calc Af Amer 30 (*)    All other components within normal limits  URINALYSIS, ROUTINE W REFLEX MICROSCOPIC (NOT AT [facility]) - Abnormal; Notable for the following:    APPearance CLOUDY (*)    All other components within normal limits  CBG MONITORING, ED - Abnormal; Notable for the following:    Glucose-Capillary 111 (*)    All other components within normal limits  I-STAT ARTERIAL BLOOD GAS, ED - Abnormal; Notable for the following:    pO2, Arterial 80.0 (*)    All other components within normal limits  CBG MONITORING, ED - Abnormal; Notable for the following:    Glucose-Capillary 47 (*)    All other components within normal limits  CBG MONITORING, ED - Abnormal; Notable for the following:    Glucose-Capillary 153 (*)    All other components within normal limits  URINE CULTURE  CULTURE, BLOOD (ROUTINE X 2)  CULTURE, BLOOD (ROUTINE X 2)  POTASSIUM  PROTIME-INR  I-STAT CG4 LACTIC ACID, ED  CBG MONITORING, ED   EKG  EKG Interpretation  Date/Time:  Tuesday September 20 2016 00:48:19 EST Ventricular Rate:  77 PR Interval:    QRS Duration: 109 QT Interval:  457 QTC Calculation: 434 R Axis:   5 Text Interpretation:  Sinus rhythm Ventricular bigeminy Prolonged PR interval Probable left atrial enlargement Incomplete left bundle branch block Confirmed by Mckinleigh Schuchart  MD, Velvet Moomaw ([id number]) on 09/20/2016 12:51:02 AM       Radiology Ct Head Wo Contrast  Result Date: 09/20/2016 CLINICAL DATA:  Altered mental status EXAM: CT HEAD WITHOUT CONTRAST TECHNIQUE: Contiguous axial images were obtained from the base of the skull through the  vertex without intravenous contrast. COMPARISON:  Brain MRI 09/06/2016 FINDINGS: Brain: No mass lesion, intraparenchymal hemorrhage or extra-axial collection. No evidence of acute cortical infarct. Brain parenchyma and CSF-containing spaces are unremarkable for age. Vascular: No hyperdense vessel or unexpected calcification. Skull: Normal visualized skull base, calvarium and extracranial soft tissues. Sinuses/Orbits: No sinus fluid levels or advanced mucosal thickening. No mastoid effusion. Normal orbits. IMPRESSION: No acute intracranial abnormality. Electronically Signed   By: Deatra Robinson M.D.   On: 09/20/2016 00:47   Dg Chest Portable 1 View  Result Date: 09/20/2016 CLINICAL DATA:  Altered mental status.  Shortness of breath. EXAM: PORTABLE CHEST 1 VIEW COMPARISON:  09/08/2016 FINDINGS: Postoperative changes in the mediastinum. Tracheostomy. Shallow inspiration. Cardiac enlargement with bilateral interstitial infiltration consistent with interstitial edema. Findings are similar to previous study. No pleural effusions.  No pneumothorax. Calcified and tortuous aorta. IMPRESSION: Shallow inspiration. Cardiac enlargement with diffuse pulmonary edema. Electronically Signed   By: Burman Nieves M.D.   On: 09/20/2016 00:25    Procedures Procedures (including critical care time)  CRITICAL CARE Performed by: Shon Baton   Total critical care time: 30 minutes  Critical care time was exclusive of separately billable procedures and treating other patients.  Critical care was necessary to treat or prevent imminent or life-threatening deterioration.  Critical care was time spent personally by me on the following activities: development of treatment plan with patient and/or surrogate as well as nursing, discussions with consultants, evaluation of patient's response to treatment, examination of patient, obtaining history from patient or surrogate, ordering and performing treatments and  interventions, ordering and review of laboratory studies, ordering and review of radiographic studies, pulse oximetry and re-evaluation of patient's condition.   Medications Ordered in ED Medications  ipratropium-albuterol (DUONEB) 0.5-2.5 (3) MG/3ML nebulizer solution 3 mL (not administered)  isosorbide mononitrate (IMDUR) 24 hr tablet 15 mg (not administered)  pantoprazole (PROTONIX) EC tablet 40 mg (not administered)  levothyroxine (SYNTHROID, LEVOTHROID) tablet 75 mcg (not administered)  WOMENS 50+ MULTI VITAMIN/MIN TABS 1 tablet (not administered)  simvastatin (ZOCOR) tablet 40 mg (not administered)  torsemide (DEMADEX) tablet 40 mg (not administered)  tiZANidine (ZANAFLEX) tablet 2 mg (not administered)  albuterol (PROVENTIL) (2.5 MG/3ML) 0.083% nebulizer solution 2.5 mg (not administered)  acetaminophen (TYLENOL) tablet 650 mg (not administered)  carvedilol (COREG) tablet 12.5 mg (not administered)  budesonide (PULMICORT) nebulizer solution 0.25 mg (not administered)  aspirin EC tablet 81 mg (not administered)  dicyclomine (BENTYL) tablet 20 mg (not administered)  gabapentin (NEURONTIN) capsule 300 mg (not administered)  ferrous sulfate tablet 325 mg (not administered)  DULoxetine (CYMBALTA) DR capsule 60 mg (not administered)  hydrALAZINE (APRESOLINE) tablet 25 mg (not administered)  senna-docusate (Senokot-S) tablet 2 tablet (not administered)  polyethylene glycol (MIRALAX / GLYCOLAX) packet 17 g (not administered)  dextrose 5 %-0.45 % sodium chloride infusion (not administered)  dextrose 50 % solution 50 mL (50 mLs Intravenous Given 09/20/16 0041)  naloxone (NARCAN) injection 2 mg (2 mg Intravenous Given 09/20/16 0120)     Initial Impression / Assessment and Plan / ED Course  I have reviewed the triage vital signs and the nursing notes.  Pertinent labs & imaging results that were available during my care of the patient were reviewed by me and considered in my medical  decision making (see chart for details).  Clinical Course     Patient presents with altered mental status. Noted to be hypoglycemic at her nursing facility. She remains altered after correction of her glucose. Initial CBG 111. She does respond to tactile stimuli and no obvious focal deficits. Lab work obtained. ABG without evidence of hypercarbia. Leukocytosis. Patient used mildly hypothermic but in the setting of hypoglycemia this is not abnormal. Lactate is negative. Chest x-ray shows no infiltrate. Urinalysis without evidence of infection. Patient persistently altered despite correction of blood sugars. Repeat blood sugar 47. She was given D50 and started on maintenance fluids with dextrose. She does have narcotics listed in pupils are small but reactive. She was given Narcan to see if her mental status improved. Upon receiving Narcan, patient returned to her baseline and is now awake and alert. She is able to answer simple question. Altered mental status likely a combination of hypoglycemia and narcotic. Will admit for observation giving ongoing episodes of hypoglycemia.  Discussed with Dr. Julian Reil.  Final Clinical  Impressions(s) / ED Diagnoses   Final diagnoses:  Altered mental status, unspecified altered mental status type  Hypoglycemia    New Prescriptions New Prescriptions   No medications on file   I personally performed the services described in this documentation, which was scribed in my presence. The recorded information has been reviewed and is accurate.     Shon Baton, MD 09/20/16 0200

## 2016-09-19 NOTE — ED Triage Notes (Signed)
[facility] mostly unresponsive responding only to pain, initial CBG on EMS arrival 28, pt got 25g of PO glucose from SNF staff and 24 g on D10 given by EMS PtA to ED, per EMS report intial SPO2 60% and tracheostomy fully clogged. Pt insuline dependent, last insuline given at 5p unknown how much insuline given, last seen well today at 6 pm, pt normally AO x 4 and active. Last CBG for EMS 171.

## 2016-09-20 ENCOUNTER — Telehealth: Payer: Self-pay | Admitting: *Deleted

## 2016-09-20 ENCOUNTER — Emergency Department (HOSPITAL_COMMUNITY): Payer: Medicare Other

## 2016-09-20 ENCOUNTER — Encounter (HOSPITAL_COMMUNITY): Payer: Self-pay

## 2016-09-20 DIAGNOSIS — R4182 Altered mental status, unspecified: Secondary | ICD-10-CM | POA: Diagnosis present

## 2016-09-20 DIAGNOSIS — Z01810 Encounter for preprocedural cardiovascular examination: Secondary | ICD-10-CM | POA: Diagnosis not present

## 2016-09-20 DIAGNOSIS — M79671 Pain in right foot: Secondary | ICD-10-CM | POA: Diagnosis not present

## 2016-09-20 DIAGNOSIS — J9621 Acute and chronic respiratory failure with hypoxia: Secondary | ICD-10-CM | POA: Diagnosis not present

## 2016-09-20 DIAGNOSIS — E11649 Type 2 diabetes mellitus with hypoglycemia without coma: Secondary | ICD-10-CM | POA: Diagnosis present

## 2016-09-20 DIAGNOSIS — Z9981 Dependence on supplemental oxygen: Secondary | ICD-10-CM | POA: Diagnosis not present

## 2016-09-20 DIAGNOSIS — J449 Chronic obstructive pulmonary disease, unspecified: Secondary | ICD-10-CM | POA: Diagnosis present

## 2016-09-20 DIAGNOSIS — G928 Other toxic encephalopathy: Secondary | ICD-10-CM | POA: Diagnosis present

## 2016-09-20 DIAGNOSIS — I10 Essential (primary) hypertension: Secondary | ICD-10-CM | POA: Diagnosis not present

## 2016-09-20 DIAGNOSIS — I745 Embolism and thrombosis of iliac artery: Secondary | ICD-10-CM | POA: Diagnosis not present

## 2016-09-20 DIAGNOSIS — I5042 Chronic combined systolic (congestive) and diastolic (congestive) heart failure: Secondary | ICD-10-CM

## 2016-09-20 DIAGNOSIS — E1122 Type 2 diabetes mellitus with diabetic chronic kidney disease: Secondary | ICD-10-CM | POA: Diagnosis present

## 2016-09-20 DIAGNOSIS — D649 Anemia, unspecified: Secondary | ICD-10-CM | POA: Diagnosis not present

## 2016-09-20 DIAGNOSIS — I739 Peripheral vascular disease, unspecified: Secondary | ICD-10-CM | POA: Diagnosis not present

## 2016-09-20 DIAGNOSIS — I272 Pulmonary hypertension, unspecified: Secondary | ICD-10-CM | POA: Diagnosis not present

## 2016-09-20 DIAGNOSIS — I998 Other disorder of circulatory system: Secondary | ICD-10-CM | POA: Diagnosis not present

## 2016-09-20 DIAGNOSIS — G92 Toxic encephalopathy: Secondary | ICD-10-CM | POA: Diagnosis not present

## 2016-09-20 DIAGNOSIS — D62 Acute posthemorrhagic anemia: Secondary | ICD-10-CM | POA: Diagnosis not present

## 2016-09-20 DIAGNOSIS — I130 Hypertensive heart and chronic kidney disease with heart failure and stage 1 through stage 4 chronic kidney disease, or unspecified chronic kidney disease: Secondary | ICD-10-CM | POA: Diagnosis not present

## 2016-09-20 DIAGNOSIS — J9611 Chronic respiratory failure with hypoxia: Secondary | ICD-10-CM

## 2016-09-20 DIAGNOSIS — E1165 Type 2 diabetes mellitus with hyperglycemia: Secondary | ICD-10-CM

## 2016-09-20 DIAGNOSIS — E162 Hypoglycemia, unspecified: Secondary | ICD-10-CM | POA: Diagnosis present

## 2016-09-20 DIAGNOSIS — I824Z1 Acute embolism and thrombosis of unspecified deep veins of right distal lower extremity: Secondary | ICD-10-CM

## 2016-09-20 DIAGNOSIS — I70221 Atherosclerosis of native arteries of extremities with rest pain, right leg: Secondary | ICD-10-CM | POA: Diagnosis not present

## 2016-09-20 DIAGNOSIS — I252 Old myocardial infarction: Secondary | ICD-10-CM | POA: Diagnosis not present

## 2016-09-20 DIAGNOSIS — A408 Other streptococcal sepsis: Secondary | ICD-10-CM | POA: Diagnosis not present

## 2016-09-20 DIAGNOSIS — Z79899 Other long term (current) drug therapy: Secondary | ICD-10-CM | POA: Diagnosis not present

## 2016-09-20 DIAGNOSIS — Z930 Tracheostomy status: Secondary | ICD-10-CM | POA: Diagnosis not present

## 2016-09-20 DIAGNOSIS — R410 Disorientation, unspecified: Secondary | ICD-10-CM | POA: Diagnosis not present

## 2016-09-20 DIAGNOSIS — Z86718 Personal history of other venous thrombosis and embolism: Secondary | ICD-10-CM | POA: Diagnosis not present

## 2016-09-20 DIAGNOSIS — T17990A Other foreign object in respiratory tract, part unspecified in causing asphyxiation, initial encounter: Secondary | ICD-10-CM | POA: Diagnosis not present

## 2016-09-20 DIAGNOSIS — T402X1A Poisoning by other opioids, accidental (unintentional), initial encounter: Secondary | ICD-10-CM | POA: Diagnosis present

## 2016-09-20 DIAGNOSIS — E118 Type 2 diabetes mellitus with unspecified complications: Secondary | ICD-10-CM | POA: Diagnosis not present

## 2016-09-20 DIAGNOSIS — Z8673 Personal history of transient ischemic attack (TIA), and cerebral infarction without residual deficits: Secondary | ICD-10-CM | POA: Diagnosis not present

## 2016-09-20 DIAGNOSIS — M79609 Pain in unspecified limb: Secondary | ICD-10-CM | POA: Diagnosis not present

## 2016-09-20 DIAGNOSIS — I82401 Acute embolism and thrombosis of unspecified deep veins of right lower extremity: Secondary | ICD-10-CM | POA: Diagnosis not present

## 2016-09-20 DIAGNOSIS — N183 Chronic kidney disease, stage 3 (moderate): Secondary | ICD-10-CM | POA: Diagnosis present

## 2016-09-20 DIAGNOSIS — N390 Urinary tract infection, site not specified: Secondary | ICD-10-CM | POA: Diagnosis not present

## 2016-09-20 DIAGNOSIS — IMO0002 Reserved for concepts with insufficient information to code with codable children: Secondary | ICD-10-CM

## 2016-09-20 LAB — CBC WITH DIFFERENTIAL/PLATELET
BASOS ABS: 0 10*3/uL (ref 0.0–0.1)
Basophils Relative: 0 %
EOS PCT: 0 %
Eosinophils Absolute: 0.1 10*3/uL (ref 0.0–0.7)
HCT: 30.8 % — ABNORMAL LOW (ref 36.0–46.0)
Hemoglobin: 9.8 g/dL — ABNORMAL LOW (ref 12.0–15.0)
LYMPHS PCT: 3 %
Lymphs Abs: 0.5 10*3/uL — ABNORMAL LOW (ref 0.7–4.0)
MCH: 28.8 pg (ref 26.0–34.0)
MCHC: 31.8 g/dL (ref 30.0–36.0)
MCV: 90.6 fL (ref 78.0–100.0)
MONO ABS: 1.4 10*3/uL — AB (ref 0.1–1.0)
Monocytes Relative: 9 %
Neutro Abs: 13.5 10*3/uL — ABNORMAL HIGH (ref 1.7–7.7)
Neutrophils Relative %: 88 %
PLATELETS: 429 10*3/uL — AB (ref 150–400)
RBC: 3.4 MIL/uL — ABNORMAL LOW (ref 3.87–5.11)
RDW: 17 % — AB (ref 11.5–15.5)
WBC: 15.5 10*3/uL — ABNORMAL HIGH (ref 4.0–10.5)

## 2016-09-20 LAB — COMPREHENSIVE METABOLIC PANEL
ALT: 24 U/L (ref 14–54)
AST: 67 U/L — AB (ref 15–41)
Albumin: 2.9 g/dL — ABNORMAL LOW (ref 3.5–5.0)
Alkaline Phosphatase: 66 U/L (ref 38–126)
Anion gap: 9 (ref 5–15)
BUN: 39 mg/dL — ABNORMAL HIGH (ref 6–20)
CHLORIDE: 98 mmol/L — AB (ref 101–111)
CO2: 26 mmol/L (ref 22–32)
Calcium: 8.7 mg/dL — ABNORMAL LOW (ref 8.9–10.3)
Creatinine, Ser: 1.91 mg/dL — ABNORMAL HIGH (ref 0.44–1.00)
GFR, EST AFRICAN AMERICAN: 30 mL/min — AB (ref >60)
GFR, EST NON AFRICAN AMERICAN: 25 mL/min — AB (ref >60)
Glucose, Bld: 84 mg/dL (ref 65–99)
POTASSIUM: 6.1 mmol/L — AB (ref 3.5–5.1)
Sodium: 133 mmol/L — ABNORMAL LOW (ref 135–145)
Total Bilirubin: 1.9 mg/dL — ABNORMAL HIGH (ref 0.3–1.2)
Total Protein: 6.9 g/dL (ref 6.5–8.1)

## 2016-09-20 LAB — CBG MONITORING, ED
GLUCOSE-CAPILLARY: 153 mg/dL — AB (ref 65–99)
GLUCOSE-CAPILLARY: 180 mg/dL — AB (ref 65–99)
GLUCOSE-CAPILLARY: 181 mg/dL — AB (ref 65–99)
GLUCOSE-CAPILLARY: 236 mg/dL — AB (ref 65–99)
GLUCOSE-CAPILLARY: 47 mg/dL — AB (ref 65–99)
Glucose-Capillary: 56 mg/dL — ABNORMAL LOW (ref 65–99)
Glucose-Capillary: 179 mg/dL — ABNORMAL HIGH (ref 65–99)
Glucose-Capillary: 215 mg/dL — ABNORMAL HIGH (ref 65–99)

## 2016-09-20 LAB — URINALYSIS, ROUTINE W REFLEX MICROSCOPIC
Bilirubin Urine: NEGATIVE
Glucose, UA: NEGATIVE mg/dL
Hgb urine dipstick: NEGATIVE
Ketones, ur: NEGATIVE mg/dL
LEUKOCYTES UA: NEGATIVE
NITRITE: NEGATIVE
Protein, ur: NEGATIVE mg/dL
SPECIFIC GRAVITY, URINE: 1.01 (ref 1.005–1.030)
pH: 5 (ref 5.0–8.0)

## 2016-09-20 LAB — HEPARIN LEVEL (UNFRACTIONATED)

## 2016-09-20 LAB — GLUCOSE, CAPILLARY: GLUCOSE-CAPILLARY: 241 mg/dL — AB (ref 65–99)

## 2016-09-20 LAB — MRSA PCR SCREENING: MRSA BY PCR: NEGATIVE

## 2016-09-20 LAB — PROTIME-INR
INR: 1.52
Prothrombin Time: 18.5 seconds — ABNORMAL HIGH (ref 11.4–15.2)

## 2016-09-20 LAB — POTASSIUM: Potassium: 4 mmol/L (ref 3.5–5.1)

## 2016-09-20 LAB — I-STAT CG4 LACTIC ACID, ED: LACTIC ACID, VENOUS: 1.42 mmol/L (ref 0.5–1.9)

## 2016-09-20 MED ORDER — TIZANIDINE HCL 2 MG PO TABS
2.0000 mg | ORAL_TABLET | Freq: Every day | ORAL | Status: DC
  Administered 2016-09-20 – 2016-10-07 (×16): 2 mg via ORAL
  Filled 2016-09-20 (×17): qty 1

## 2016-09-20 MED ORDER — SENNOSIDES-DOCUSATE SODIUM 8.6-50 MG PO TABS
2.0000 | ORAL_TABLET | Freq: Every evening | ORAL | Status: DC | PRN
  Administered 2016-09-30 – 2016-10-02 (×2): 2 via ORAL
  Filled 2016-09-20 (×3): qty 2

## 2016-09-20 MED ORDER — ALBUTEROL SULFATE (2.5 MG/3ML) 0.083% IN NEBU: 2.5000 mg | INHALATION_SOLUTION | RESPIRATORY_TRACT | Status: DC | PRN

## 2016-09-20 MED ORDER — DEXTROSE 50 % IV SOLN
1.0000 | Freq: Once | INTRAVENOUS | Status: AC
  Administered 2016-09-20: 50 mL via INTRAVENOUS
  Filled 2016-09-20: qty 50

## 2016-09-20 MED ORDER — CARVEDILOL 12.5 MG PO TABS
12.5000 mg | ORAL_TABLET | Freq: Two times a day (BID) | ORAL | Status: DC
  Administered 2016-09-20 – 2016-10-07 (×31): 12.5 mg via ORAL
  Filled 2016-09-20 (×31): qty 1

## 2016-09-20 MED ORDER — LEVOTHYROXINE SODIUM 75 MCG PO TABS
75.0000 ug | ORAL_TABLET | Freq: Every day | ORAL | Status: DC
  Administered 2016-09-20 – 2016-10-07 (×15): 75 ug via ORAL
  Filled 2016-09-20 (×17): qty 1

## 2016-09-20 MED ORDER — BUDESONIDE 0.25 MG/2ML IN SUSP
0.2500 mg | Freq: Two times a day (BID) | RESPIRATORY_TRACT | Status: DC
  Administered 2016-09-20 – 2016-10-06 (×30): 0.25 mg via RESPIRATORY_TRACT
  Filled 2016-09-20 (×33): qty 2

## 2016-09-20 MED ORDER — WARFARIN - PHARMACIST DOSING INPATIENT
Freq: Every day | Status: DC
  Administered 2016-09-21 – 2016-09-22 (×2)

## 2016-09-20 MED ORDER — HEPARIN (PORCINE) IN NACL 100-0.45 UNIT/ML-% IJ SOLN
1400.0000 [IU]/h | INTRAMUSCULAR | Status: DC
  Administered 2016-09-20: 1200 [IU]/h via INTRAVENOUS
  Administered 2016-09-21: 1400 [IU]/h via INTRAVENOUS
  Filled 2016-09-20 (×3): qty 250

## 2016-09-20 MED ORDER — ISOSORBIDE MONONITRATE ER 30 MG PO TB24
15.0000 mg | ORAL_TABLET | Freq: Every day | ORAL | Status: DC
  Administered 2016-09-20 – 2016-10-07 (×16): 15 mg via ORAL
  Filled 2016-09-20 (×17): qty 1

## 2016-09-20 MED ORDER — INSULIN ASPART 100 UNIT/ML ~~LOC~~ SOLN
0.0000 [IU] | SUBCUTANEOUS | Status: DC
  Administered 2016-09-20: 3 [IU] via SUBCUTANEOUS
  Filled 2016-09-20: qty 1

## 2016-09-20 MED ORDER — POLYETHYLENE GLYCOL 3350 17 G PO PACK
17.0000 g | PACK | Freq: Every day | ORAL | Status: DC | PRN
  Administered 2016-09-30 – 2016-10-07 (×3): 17 g via ORAL
  Filled 2016-09-20 (×3): qty 1

## 2016-09-20 MED ORDER — PANTOPRAZOLE SODIUM 40 MG PO TBEC
40.0000 mg | DELAYED_RELEASE_TABLET | Freq: Every day | ORAL | Status: DC
  Administered 2016-09-20 – 2016-10-07 (×16): 40 mg via ORAL
  Filled 2016-09-20 (×16): qty 1

## 2016-09-20 MED ORDER — GLUCOSE 40 % PO GEL
ORAL | Status: AC
  Filled 2016-09-20: qty 1

## 2016-09-20 MED ORDER — FERROUS SULFATE 325 (65 FE) MG PO TABS
325.0000 mg | ORAL_TABLET | Freq: Every day | ORAL | Status: DC
  Administered 2016-09-21 – 2016-10-07 (×15): 325 mg via ORAL
  Filled 2016-09-20 (×15): qty 1

## 2016-09-20 MED ORDER — IPRATROPIUM-ALBUTEROL 0.5-2.5 (3) MG/3ML IN SOLN: 3.0000 mL | Freq: Two times a day (BID) | RESPIRATORY_TRACT | Status: DC | PRN

## 2016-09-20 MED ORDER — GABAPENTIN 300 MG PO CAPS
300.0000 mg | ORAL_CAPSULE | Freq: Every day | ORAL | Status: DC
Start: 2016-09-20 — End: 2016-10-07
  Administered 2016-09-20 – 2016-10-06 (×17): 300 mg via ORAL
  Filled 2016-09-20 (×17): qty 1

## 2016-09-20 MED ORDER — HYDRALAZINE HCL 25 MG PO TABS
25.0000 mg | ORAL_TABLET | Freq: Three times a day (TID) | ORAL | Status: DC
  Administered 2016-09-20 – 2016-10-07 (×46): 25 mg via ORAL
  Filled 2016-09-20 (×49): qty 1

## 2016-09-20 MED ORDER — TORSEMIDE 20 MG PO TABS
40.0000 mg | ORAL_TABLET | Freq: Every day | ORAL | Status: DC
  Administered 2016-09-20 – 2016-10-07 (×15): 40 mg via ORAL
  Filled 2016-09-20 (×15): qty 2

## 2016-09-20 MED ORDER — INSULIN ASPART 100 UNIT/ML ~~LOC~~ SOLN
0.0000 [IU] | SUBCUTANEOUS | Status: DC
  Administered 2016-09-20 – 2016-09-21 (×2): 5 [IU] via SUBCUTANEOUS
  Administered 2016-09-21: 2 [IU] via SUBCUTANEOUS
  Administered 2016-09-21 (×2): 3 [IU] via SUBCUTANEOUS
  Administered 2016-09-21: 2 [IU] via SUBCUTANEOUS
  Administered 2016-09-21 – 2016-09-23 (×5): 3 [IU] via SUBCUTANEOUS
  Administered 2016-09-23: 1 [IU] via SUBCUTANEOUS
  Administered 2016-09-23: 5 [IU] via SUBCUTANEOUS
  Administered 2016-09-23: 3 [IU] via SUBCUTANEOUS
  Administered 2016-09-24: 5 [IU] via SUBCUTANEOUS
  Administered 2016-09-24: 2 [IU] via SUBCUTANEOUS
  Administered 2016-09-24 (×2): 3 [IU] via SUBCUTANEOUS
  Administered 2016-09-25: 11 [IU] via SUBCUTANEOUS
  Administered 2016-09-25 (×2): 3 [IU] via SUBCUTANEOUS
  Administered 2016-09-25 (×3): 2 [IU] via SUBCUTANEOUS
  Administered 2016-09-26 (×3): 3 [IU] via SUBCUTANEOUS
  Administered 2016-09-26: 8 [IU] via SUBCUTANEOUS
  Administered 2016-09-26 (×2): 2 [IU] via SUBCUTANEOUS
  Administered 2016-09-27 (×2): 3 [IU] via SUBCUTANEOUS
  Administered 2016-09-27: 5 [IU] via SUBCUTANEOUS
  Administered 2016-09-27: 2 [IU] via SUBCUTANEOUS
  Administered 2016-09-27: 5 [IU] via SUBCUTANEOUS
  Administered 2016-09-27 – 2016-09-28 (×2): 3 [IU] via SUBCUTANEOUS
  Administered 2016-09-29: 11 [IU] via SUBCUTANEOUS
  Administered 2016-09-29: 5 [IU] via SUBCUTANEOUS
  Administered 2016-09-29 – 2016-09-30 (×3): 8 [IU] via SUBCUTANEOUS
  Administered 2016-09-30: 3 [IU] via SUBCUTANEOUS
  Administered 2016-09-30: 5 [IU] via SUBCUTANEOUS
  Administered 2016-09-30 – 2016-10-01 (×2): 2 [IU] via SUBCUTANEOUS
  Administered 2016-10-01: 8 [IU] via SUBCUTANEOUS
  Administered 2016-10-01 (×2): 5 [IU] via SUBCUTANEOUS
  Administered 2016-10-02: 11 [IU] via SUBCUTANEOUS
  Administered 2016-10-02: 3 [IU] via SUBCUTANEOUS
  Administered 2016-10-02: 8 [IU] via SUBCUTANEOUS
  Administered 2016-10-02 (×2): 3 [IU] via SUBCUTANEOUS
  Administered 2016-10-02: 2 [IU] via SUBCUTANEOUS
  Administered 2016-10-03: 8 [IU] via SUBCUTANEOUS
  Administered 2016-10-03: 3 [IU] via SUBCUTANEOUS
  Administered 2016-10-03: 5 [IU] via SUBCUTANEOUS
  Administered 2016-10-04 (×3): 2 [IU] via SUBCUTANEOUS
  Administered 2016-10-05: 3 [IU] via SUBCUTANEOUS
  Administered 2016-10-05: 5 [IU] via SUBCUTANEOUS
  Administered 2016-10-05 (×2): 2 [IU] via SUBCUTANEOUS
  Administered 2016-10-05 – 2016-10-06 (×2): 3 [IU] via SUBCUTANEOUS
  Administered 2016-10-06: 2 [IU] via SUBCUTANEOUS
  Administered 2016-10-06: 3 [IU] via SUBCUTANEOUS
  Administered 2016-10-07 (×2): 2 [IU] via SUBCUTANEOUS

## 2016-09-20 MED ORDER — DEXTROSE-NACL 5-0.45 % IV SOLN
INTRAVENOUS | Status: DC
  Administered 2016-09-20: 50 mL/h via INTRAVENOUS

## 2016-09-20 MED ORDER — SIMVASTATIN 40 MG PO TABS
40.0000 mg | ORAL_TABLET | Freq: Every evening | ORAL | Status: DC
  Administered 2016-09-20 – 2016-10-06 (×15): 40 mg via ORAL
  Filled 2016-09-20 (×17): qty 1

## 2016-09-20 MED ORDER — NALOXONE HCL 2 MG/2ML IJ SOSY
2.0000 mg | PREFILLED_SYRINGE | Freq: Once | INTRAMUSCULAR | Status: AC
  Administered 2016-09-20: 2 mg via INTRAVENOUS
  Filled 2016-09-20: qty 2

## 2016-09-20 MED ORDER — ASPIRIN EC 81 MG PO TBEC
81.0000 mg | DELAYED_RELEASE_TABLET | Freq: Every day | ORAL | Status: DC
Start: 2016-09-20 — End: 2016-10-07
  Administered 2016-09-20 – 2016-10-07 (×16): 81 mg via ORAL
  Filled 2016-09-20 (×16): qty 1

## 2016-09-20 MED ORDER — DEXTROSE 50 % IV SOLN: 25.0000 g | Freq: Once | INTRAVENOUS | Status: DC

## 2016-09-20 MED ORDER — DEXTROSE 10 % IV SOLN
INTRAVENOUS | Status: DC
  Administered 2016-09-20: 08:00:00 via INTRAVENOUS

## 2016-09-20 MED ORDER — DICYCLOMINE HCL 20 MG PO TABS
20.0000 mg | ORAL_TABLET | Freq: Three times a day (TID) | ORAL | Status: DC
Start: 2016-09-20 — End: 2016-10-07
  Administered 2016-09-20 – 2016-10-07 (×40): 20 mg via ORAL
  Filled 2016-09-20 (×51): qty 1

## 2016-09-20 MED ORDER — ADULT MULTIVITAMIN W/MINERALS CH
1.0000 | ORAL_TABLET | Freq: Every day | ORAL | Status: DC
  Administered 2016-09-20 – 2016-10-07 (×16): 1 via ORAL
  Filled 2016-09-20 (×16): qty 1

## 2016-09-20 MED ORDER — DEXTROSE 50 % IV SOLN
1.0000 | Freq: Once | INTRAVENOUS | Status: AC
  Administered 2016-09-20: 50 mL via INTRAVENOUS

## 2016-09-20 MED ORDER — DEXTROSE-NACL 5-0.9 % IV SOLN
INTRAVENOUS | Status: DC
Start: 2016-09-20 — End: 2016-09-20
  Administered 2016-09-20: 50 mL/h via INTRAVENOUS

## 2016-09-20 MED ORDER — ACETAMINOPHEN 325 MG PO TABS
650.0000 mg | ORAL_TABLET | ORAL | Status: DC | PRN
  Administered 2016-09-20 – 2016-10-05 (×8): 650 mg via ORAL
  Filled 2016-09-20 (×9): qty 2

## 2016-09-20 MED ORDER — WARFARIN SODIUM 5 MG PO TABS
5.0000 mg | ORAL_TABLET | Freq: Once | ORAL | Status: AC
  Administered 2016-09-20: 5 mg via ORAL
  Filled 2016-09-20 (×2): qty 1

## 2016-09-20 MED ORDER — DULOXETINE HCL 60 MG PO CPEP
60.0000 mg | ORAL_CAPSULE | Freq: Every day | ORAL | Status: DC
Start: 2016-09-20 — End: 2016-10-07
  Administered 2016-09-20 – 2016-10-06 (×17): 60 mg via ORAL
  Filled 2016-09-20 (×17): qty 1

## 2016-09-20 NOTE — Progress Notes (Signed)
[facility]. CSW to follow for return to SNF when medically appropriate and patient/family agreeable.          Lance MussAshley Gardner,MSW, LCSW [facility]/70[facility] Clinical Social Worker [phone]

## 2016-09-20 NOTE — ED Notes (Signed)
cbg was 179 

## 2016-09-20 NOTE — Progress Notes (Signed)
ANTICOAGULATION CONSULT NOTE - Initial Consult  Pharmacy Consult for Coumadin Indication: h/o DVT  Allergies  Allergen Reactions  . Aldactone [Spironolactone] Other (See Comments)    Severe hyperkalemia   . Lisinopril Other (See Comments) and Cough    Hypotension also  . Crestor [Rosuvastatin Calcium] Other (See Comments)    Muscle Pain  . Vicodin [Hydrocodone-Acetaminophen] Nausea And Vomiting    Patient Measurements: Height: 5\' 2"  (157.5 cm) Weight: 161 lb (73 kg) IBW/kg (Calculated) : 50.1   Vital Signs: Temp: 96.2 F (35.7 C) (11/27 2348) Temp Source: Rectal (11/27 2348) BP: 164/74 (11/28 0330) Pulse Rate: 81 (11/28 0330)  Labs:  Recent Labs  09/19/16 2320 09/20/16 0305  HGB 9.8*  --   HCT 30.8*  --   PLT 429*  --   LABPROT  --  18.5*  INR  --  1.52  CREATININE 1.91*  --     Estimated Creatinine Clearance: 25.7 mL/min (by C-G formula based on SCr of 1.91 mg/dL (H)).   Medical History: Past Medical History:  Diagnosis Date  . Anemia   . Arthritis    "hands, back" (06/28/2016)  . BRBPR (bright red blood per rectum) 06/28/2016  . CAD (coronary artery disease)    a. S/p CABG and stenting // b. Myoview 3/16: high risk EF 25%, ant-apical AK, ant scar with peri-infarct ischemia // c. LHC 3/16 - EF 45%, inferior hypokinesis, oLAD 100, oLCx 40, branch vessel 50, pRCA 100 dRCA , RV br 90, S-RCA occluded, S-LAD patent with inf-apical LAD beyond graft 70 >> med Rx  . Carotid artery disease (HCC)    a. s/p L CEA // b. Carotid US 4/16: R 40-59%; L CEA patent with 40-59%  . Chronic combined systolic and diastolic CHF (congestive heart failure) (HCC)    a. 11/2011 Echo with EF 30-35%, global hypokinesis, and inferior akinesis;  b. 12/2014 Echo: EF 50%, mod LVH, Ao sclerosis w/o stenosis, mildly dil LA, mild to mod RV dysfxn. // c. Echo 10/16: Moderate LVH, EF 35-40%, inferior AK, grade 1 diastolic dysfunction, MAC, mild MR, moderate LAE  . Chronic lower back pain   . CKD  (chronic kidney disease), stage III   . COPD (chronic obstructive pulmonary disease) (HCC)    a. prn and HS supplemental O2.  . DDD (degenerative disc disease), lumbar   . Depression   . GERD (gastroesophageal reflux disease)   . History of blood transfusion ~ 2015; 06/28/2016   "anemia"  . History of IBS   . History of tracheostomy 08/26/2013   Dr. Jenne PaneBates  . Hyperlipidemia   . Hypertension   . Hypothyroidism    Goiter  . MI (myocardial infarction) 1997  . On home oxygen therapy    "4L at night & prn during the day" (06/28/2016)  . PAD (peripheral artery disease) (HCC)   . Stroke [facility](HCC)    MRI 11/2011 with remote occipital lobe. MRA with moderate left focal vertebral artery stenosis  . Type II diabetes mellitus (HCC)     Medications:  No current facility-administered medications on file prior to encounter.    Current Outpatient Prescriptions on File Prior to Encounter  Medication Sig Dispense Refill  . acetaminophen (TYLENOL) 325 MG tablet Take 2 tablets (650 mg total) by mouth every 4 (four) hours as needed for mild pain or fever (give with each dose Ultram). 10 tablet 0  . albuterol (PROVENTIL) (2.5 MG/3ML) 0.083% nebulizer solution Take 3 mLs (2.5 mg total) by nebulization every 2 (  two) hours as needed for wheezing or shortness of breath. 75 mL 12  . aspirin EC 81 MG tablet Take 81 mg by mouth daily.    . budesonide (PULMICORT) 0.25 MG/2ML nebulizer solution Take 2 mLs (0.25 mg total) by nebulization 2 (two) times daily. 60 mL 12  . carvedilol (COREG) 12.5 MG tablet Take 1 tablet (12.5 mg total) by mouth 2 (two) times daily with a meal. 180 tablet 2  . dicyclomine (BENTYL) 20 MG tablet Take 20 mg by mouth 3 (three) times daily before meals.    . DULoxetine (CYMBALTA) 60 MG capsule Take 60 mg by mouth at bedtime.   5  . ferrous sulfate 325 (65 FE) MG tablet Take 325 mg by mouth daily with breakfast.    . gabapentin (NEURONTIN) 300 MG capsule Take 1 capsule (300 mg total) by mouth at  bedtime. 90 capsule 1  . glucose ([employer] GLUCOSE) 4 GM chewable tablet Chew 1 tablet by mouth daily as needed for low blood sugar.     . hydrALAZINE (APRESOLINE) 25 MG tablet Take 1 tablet (25 mg total) by mouth 3 (three) times daily. 270 tablet 3  . HYDROcodone-acetaminophen (NORCO/VICODIN) 5-325 MG tablet Take 1 tablet by mouth every 4 (four) hours as needed for severe pain (give only if Ultram ineffective). 10 tablet 0  . Insulin Degludec (TRESIBA FLEXTOUCH) 100 UNIT/ML SOPN Inject 24 Units into the skin at bedtime.     Marland Kitchen ipratropium-albuterol (DUONEB) 0.5-2.5 (3) MG/3ML SOLN Take 3 mLs by nebulization 2 (two) times daily as needed (for shortnes of breath).     . isosorbide mononitrate (IMDUR) 30 MG 24 hr tablet Take 30 mg by mouth daily.     . lansoprazole (PREVACID) 30 MG capsule Take 30 mg by mouth 2 (two) times daily.    Marland Kitchen levothyroxine (SYNTHROID, LEVOTHROID) 75 MCG tablet Take 75 mcg by mouth daily before breakfast.     . Multiple Vitamins-Minerals (WOMENS 50+ MULTI VITAMIN/MIN) TABS Take 1 tablet by mouth daily.    . nitroGLYCERIN (NITROSTAT) 0.4 MG SL tablet Place 0.4 mg under the tongue every 5 (five) minutes as needed for chest pain (x 3 doses). For chest pain    . NOVOLOG FLEXPEN 100 UNIT/ML FlexPen 70 -199 TAKE 12 UNITS, 200-299 TAKE 14 UNITS, 300 OR HIGHER 16 UNITS SUBCUTANEOUS (12-16 units subcutaneously three (3) times a day)  4  . OXYGEN-HELIUM IN Inhale 4 L into the lungs at bedtime.     . polyethylene glycol (MIRALAX / GLYCOLAX) packet Take 17 g by mouth daily. 14 each 0  . Probiotic Product (PROBIOTIC PO) Take 250 mg by mouth daily.     Marland Kitchen senna-docusate (SENOKOT-S) 8.6-50 MG tablet Take 2 tablets by mouth at bedtime. 30 tablet 0  . simvastatin (ZOCOR) 40 MG tablet Take 40 mg by mouth every evening.     Marland Kitchen tiZANidine (ZANAFLEX) 2 MG tablet Take 1 tablet (2 mg total) by mouth daily. 10 tablet 0  . torsemide (DEMADEX) 20 MG tablet Take 40 mg by mouth daily.     . traMADol (ULTRAM)  50 MG tablet Take 1 tablet (50 mg total) by mouth every 6 (six) hours as needed for moderate pain. 10 tablet 0  . warfarin (COUMADIN) 5 MG tablet Take 1 tablet (5 mg total) by mouth daily. (Patient taking differently: Take 2.5-5 mg by mouth daily. Alternating between 2.5 mg and 5 mg daily.) 30 tablet 0  . [DISCONTINUED] dexlansoprazole (DEXILANT) 60 MG capsule Take 60  mg by mouth daily.       Assessment: 70 y.o. female admitted with AMS/hypoglycemia, h/o DVT, to continue Coumadin  Goal of Therapy:  INR 2-3 Monitor platelets by anticoagulation protocol: Yes   Plan:  Coumadin 5 mg today Daily INR  Eddie Candlebbott, Urian Martenson [place] 09/20/2016,4:06 AM

## 2016-09-20 NOTE — ED Notes (Signed)
Applied a hot pack to the rt. Knee for pain control

## 2016-09-20 NOTE — ED Notes (Signed)
Spoke with Dr. Joseph ArtWoods. Reported to him the pt.s CBG 210 and can we stop the hourly CBG.  Orders received to stop them also orders received to change bed placement from SDU to Telemetry.    Also reported to Dr.Woods that I spoke with Speech Therapy and they will be paging out for a therapist to come down and do pt.s Swallow Evaluation.

## 2016-09-20 NOTE — Progress Notes (Signed)
ANTICOAGULATION CONSULT NOTE - Follow Up Consult  Pharmacy Consult for heparin/warfarin Indication: pulmonary embolus  Allergies  Allergen Reactions  . Aldactone [Spironolactone] Other (See Comments)    Severe hyperkalemia   . Lisinopril Other (See Comments) and Cough    Hypotension also  . Crestor [Rosuvastatin Calcium] Other (See Comments)    Muscle Pain  . Vicodin [Hydrocodone-Acetaminophen] Nausea And Vomiting    Patient Measurements: Height: 5\' 2"  (157.5 cm) Weight: 161 lb (73 kg) IBW/kg (Calculated) : 50.1 Heparin Dosing Weight: 66 kg  Vital Signs: Temp: 96.2 F (35.7 C) (11/27 2348) Temp Source: Rectal (11/27 2348) BP: 133/119 (11/28 0945) Pulse Rate: 87 (11/28 0945)  Labs:  Recent Labs  09/19/16 2320 09/20/16 0305  HGB 9.8*  --   HCT 30.8*  --   PLT 429*  --   LABPROT  --  18.5*  INR  --  1.52  CREATININE 1.91*  --     Estimated Creatinine Clearance: 25.7 mL/min (by C-G formula based on SCr of 1.91 mg/dL (H)).  Assessment: 70 yo f presenting with AMS  PMH: COPD, Chronic Trach, DM, CHF, CVA, hx of PE, hx of GI bleed  Anticoag: warfarin pta for PE. Admit INR 1.52 adding on IV hep for low INR  Home warfarin dose alternating 2.5 and 5 mg - 5mg  taken 11/27  Nephro: SCr 1.91  Heme/Onc: H&H 9.8/30.8, Plt 429  Goal of Therapy:  INR 2-3 Heparin level 0.3-0.7 units/ml Monitor platelets by anticoagulation protocol: Yes   Plan:  Heparin 1200 units/hr (no bolus with recent GI bleed) 1830 HL  Warfarin 5 mg x 1 Daily INR, CBC, HL Monitor s/sx of bleeding Monitor INR for d/c of hep  Isaac BlissMichael Shilah Hefel, PharmD, BCPS, BCCCP Clinical Pharmacist Clinical phone for 09/20/2016 from 7a-3:30p: [phone] If after 3:30p, please call main pharmacy at: [phone] 09/20/2016 10:10 AM

## 2016-09-20 NOTE — H&P (Signed)
History and Physical    [name]:[id number] DOB: 02-Aug-1946 DOA: 09/19/2016   PCP: Cala Bradford, MD Chief Complaint:  Chief Complaint  Patient presents with  . Altered Mental Status    HPI: [name] is a 70 y.o. 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  Patient last hospitalized earlier this month.  Patient brought in to ED by EMS after being found minimally responsive less than 1 hour ago.  Patient from [facility].  Was initially responding only to painful stimuli.  On EMS arrival patient CBG was 28, she got 25g PO glucose and BGL improved.  ED Course: In the ED patient had to get re-dosed with glucose in the form of D50 due to BGL dropping down to 47.  Despite interventions patient was still altered so EDP tried narcan on the patient.  Narcan worked well and patient had immediate improvement in mental status.  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  Patient indicates that she takes medications and food PO and does not have a PEG tube in place (no PEG tube is noted on exam).  Review of Systems: As per HPI otherwise 10 point review of systems negative.    Past Medical History:  Diagnosis Date  . Anemia   . Arthritis    "hands, back" (06/28/2016)  . BRBPR (bright red blood per rectum) 06/28/2016  . CAD (coronary artery disease)    a. S/p CABG and stenting // b. Myoview 3/16: high risk EF 25%, ant-apical AK, ant scar with peri-infarct ischemia // c. LHC 3/16 - EF 45%, inferior hypokinesis, oLAD 100, oLCx 40, branch vessel 50, pRCA 100 dRCA , RV br 90, S-RCA occluded, S-LAD patent with inf-apical LAD beyond graft 70 >> med Rx  . Carotid artery disease (HCC)    a. s/p L CEA // b. Carotid US 4/16: R  40-59%; L CEA patent with 40-59%  . Chronic combined systolic and diastolic CHF (congestive heart failure) (HCC)    a. 11/2011 Echo with EF 30-35%, global hypokinesis, and inferior akinesis;  b. 12/2014 Echo: EF 50%, mod LVH, Ao sclerosis w/o stenosis, mildly dil LA, mild to mod RV dysfxn. // c. Echo 10/16: Moderate LVH, EF 35-40%, inferior AK, grade 1 diastolic dysfunction, MAC, mild MR, moderate LAE  . Chronic lower back pain   . CKD (chronic kidney disease), stage III   . COPD (chronic obstructive pulmonary disease) (HCC)    a. prn and HS supplemental O2.  . DDD (degenerative disc disease), lumbar   . Depression   . GERD (gastroesophageal reflux disease)   . History of blood transfusion ~ 2015; 06/28/2016   "anemia"  . History of IBS   . History of tracheostomy 08/26/2013   Dr. Jenne Pane  . Hyperlipidemia   . Hypertension   . Hypothyroidism    Goiter  . MI (myocardial infarction) 1997  . On home oxygen therapy    "4L at night & prn during the day" (06/28/2016)  . PAD (peripheral artery disease) (HCC)   . Stroke [facility])    MRI 11/2011 with remote occipital lobe. MRA with moderate left focal vertebral artery stenosis  . Type II diabetes mellitus (HCC)     Past Surgical History:  Procedure Laterality Date  . ABDOMINAL AORTAGRAM N/A 04/05/2013   Procedure: ABDOMINAL Ronny Flurry;  Surgeon: Sherren Kerns, MD;  Location: [facility] CATH LAB;  Service: Cardiovascular;  Laterality: N/A;  . [id number]  [id number]   Aortogram by Dr. [place] McKenzie [facility])  . CAROTID ENDARTERECTOMY Left ~2008  . CARPAL TUNNEL RELEASE Left   . CATARACT EXTRACTION W/ INTRAOCULAR LENS IMPLANT & ANTERIOR VITRECTOMY, BILATERAL  ~2007-02/2016   "left-right"  . CHOLECYSTECTOMY OPEN    . COLONOSCOPY N/A 10/27/2014   Procedure: COLONOSCOPY;  Surgeon: Willis Modena, MD;  Location: [facility] ENDOSCOPY;  Service: Endoscopy;  Laterality: N/A;  . CORONARY ARTERY BYPASS GRAFT     2 vessel  . DILATION AND CURETTAGE OF UTERUS    .  FEMORAL-TIBIAL BYPASS GRAFT Left 06/11/2013   Procedure: BYPASS GRAFT  LEFT FEMORAL- POSTERIOR TIBIAL ARTERY/ REDO;  Surgeon: Sherren Kerns, MD;  Location: [facility] OR;  Service: Vascular;  Laterality: Left;  . LEFT AND RIGHT HEART CATHETERIZATION WITH CORONARY ANGIOGRAM N/A 01/09/2015   Procedure: LEFT AND RIGHT HEART CATHETERIZATION WITH CORONARY ANGIOGRAM;  Surgeon: Marykay Lex, MD;  Location: [facility] CATH LAB;  Service: Cardiovascular;  Laterality: N/A;  . PR VEIN BYPASS GRAFT,AORTO-FEM-POP     Right common femoral-AK popliteal BPG & Right Popliteal-posterior tibial  . PR VEIN BYPASS GRAFT,AORTO-FEM-POP     Left Fem-pop BPG  . PTCA    . SPINE SURGERY  Oct. 27, 2014   Injection - Back  . THROMBECTOMY FEMORAL ARTERY Left 06/11/2013   Procedure: THROMBECTOMY FEMORAL ARTERY;  Surgeon: Sherren Kerns, MD;  Location: [facility] OR;  Service: Vascular;  Laterality: Left;  . THYROIDECTOMY    . TRACHEOSTOMY TUBE PLACEMENT  01/02/2012  . TUBAL LIGATION       reports that she quit smoking about 4 years ago. Her smoking use included Cigarettes. She has a 50.00 pack-year smoking history. She has never used smokeless tobacco. She reports that she does not drink alcohol or use drugs.  Allergies  Allergen Reactions  . Aldactone [Spironolactone] Other (See Comments)    Severe hyperkalemia   . Lisinopril Other (See Comments) and Cough    Hypotension also  . Crestor [Rosuvastatin Calcium] Other (See Comments)    Muscle Pain  . Vicodin [Hydrocodone-Acetaminophen] Nausea And Vomiting    Family History  Problem Relation Age of Onset  . Hyperlipidemia Mother   . Other Mother     AAA  . Alzheimer's disease Mother   . Heart disease Mother   . Irregular heart beat Mother   . Diabetes Daughter   . Hypertension Daughter       Prior to Admission medications   Medication Sig Start Date End Date Taking? Authorizing Provider  acetaminophen (TYLENOL) 325 MG tablet Take 2 tablets (650 mg total) by mouth every 4  (four) hours as needed for mild pain or fever (give with each dose Ultram). 09/09/16   Merlene Laughter, DO  albuterol (PROVENTIL) (2.5 MG/3ML) 0.083% nebulizer solution Take 3 mLs (2.5 mg total) by nebulization every 2 (two) hours as needed for wheezing or shortness of breath. 09/09/16   Merlene Laughter, DO  aspirin EC 81 MG tablet Take 81 mg by mouth daily.    Historical Provider, MD  budesonide (PULMICORT) 0.25 MG/2ML nebulizer solution Take 2 mLs (0.25 mg total) by nebulization 2 (two) times daily. 09/09/16   Omair Latif Sheikh, DO  carvedilol (COREG) 12.5 MG tablet Take 1 tablet (12.5 mg total) by mouth 2 (two) times  daily with a meal. 03/29/16   Jake BatheMark C Skains, MD  cephALEXin (KEFLEX) 250 MG capsule Take 1 capsule (250 mg total) by mouth every 12 (twelve) hours. 09/09/16   Omair Latif Sheikh, DO  dicyclomine (BENTYL) 20 MG tablet Take 20 mg by mouth 3 (three) times daily before meals.    Historical Provider, MD  DULoxetine (CYMBALTA) 60 MG capsule Take 60 mg by mouth at bedtime.  08/06/14   Historical Provider, MD  ferrous sulfate 325 (65 FE) MG tablet Take 325 mg by mouth daily with breakfast.    Historical Provider, MD  gabapentin (NEURONTIN) 300 MG capsule Take 1 capsule (300 mg total) by mouth at bedtime. 03/29/16   [name] Tuchman, DPM  glucose ([employer] GLUCOSE) 4 GM chewable tablet Chew 1 tablet by mouth daily as needed for low blood sugar.     Historical Provider, MD  hydrALAZINE (APRESOLINE) 25 MG tablet Take 1 tablet (25 mg total) by mouth 3 (three) times daily. 04/11/16   Beatrice LecherScott T Weaver, PA-C  HYDROcodone-acetaminophen (NORCO/VICODIN) 5-325 MG tablet Take 1 tablet by mouth every 4 (four) hours as needed for severe pain (give only if Ultram ineffective). 09/09/16   Omair Latif Sheikh, DO  Insulin Degludec (TRESIBA FLEXTOUCH) 100 UNIT/ML SOPN Inject 24 Units into the skin at bedtime.     Historical Provider, MD  ipratropium-albuterol (DUONEB) 0.5-2.5 (3) MG/3ML SOLN Take 3 mLs by  nebulization 2 (two) times daily as needed (for shortnes of breath).     Historical Provider, MD  isosorbide mononitrate (IMDUR) 30 MG 24 hr tablet Take 15 mg by mouth daily.    Historical Provider, MD  lansoprazole (PREVACID) 30 MG capsule Take 30 mg by mouth 2 (two) times daily. 06/08/16   Historical Provider, MD  levothyroxine (SYNTHROID, LEVOTHROID) 75 MCG tablet Take 75 mcg by mouth daily before breakfast.  06/12/16   Historical Provider, MD  Multiple Vitamins-Minerals (WOMENS 50+ MULTI VITAMIN/MIN) TABS Take 1 tablet by mouth daily.    Historical Provider, MD  nitroGLYCERIN (NITROSTAT) 0.4 MG SL tablet Place 0.4 mg under the tongue every 5 (five) minutes as needed for chest pain (x 3 doses). For chest pain    Historical Provider, MD  NOVOLOG FLEXPEN 100 UNIT/ML FlexPen 70 -199 TAKE 12 UNITS, 200-299 TAKE 14 UNITS, 300 OR HIGHER 16 UNITS SUBCUTANEOUS (12-16 units subcutaneously three (3) times a day) 10/01/15   Historical Provider, MD  OXYGEN-HELIUM IN Inhale 4 L into the lungs at bedtime.     Historical Provider, MD  polyethylene glycol (MIRALAX / GLYCOLAX) packet Take 17 g by mouth daily. Patient taking differently: Take 17 g by mouth daily as needed for mild constipation.  07/04/16   Albertine GratesFang Xu, MD  Probiotic Product (PROBIOTIC PO) Take 1 tablet by mouth daily.     Historical Provider, MD  senna-docusate (SENOKOT-S) 8.6-50 MG tablet Take 2 tablets by mouth at bedtime. Patient taking differently: Take 2 tablets by mouth at bedtime as needed for mild constipation.  07/04/16   Albertine GratesFang Xu, MD  simvastatin (ZOCOR) 40 MG tablet Take 40 mg by mouth every evening.  11/28/11   Danley DankerBradley W Wainright, MD  tiZANidine (ZANAFLEX) 2 MG tablet Take 1 tablet (2 mg total) by mouth daily. 09/09/16   Omair Latif Sheikh, DO  torsemide (DEMADEX) 20 MG tablet Take 40 mg by mouth daily.     Historical Provider, MD  traMADol (ULTRAM) 50 MG tablet Take 1 tablet (50 mg total) by mouth every 6 (six) hours as  needed for moderate pain.  09/09/16   Merlene Laughter, DO  warfarin (COUMADIN) 5 MG tablet Take 1 tablet (5 mg total) by mouth daily. 09/09/16   Merlene Laughter, DO    Physical Exam: Vitals:   09/20/16 0115 09/20/16 0149 09/20/16 0200 09/20/16 0230  BP: 177/57 194/84 (!) 176/111   Pulse: (!) 35 86 89   Resp: 25 22 26    Temp:      TempSrc:      SpO2: 99% 100% 100% 96%  Weight:      Height:          Constitutional: NAD, calm, comfortable Eyes: PERRL, lids and conjunctivae normal ENMT: Mucous membranes are moist. Posterior pharynx clear of any exudate or lesions.Normal dentition.  Neck: normal, supple, no masses, no thyromegaly Respiratory: Trach in place, coarse breath sounds. Cardiovascular: Regular rate and rhythm, no murmurs / rubs / gallops. No extremity edema. 2+ pedal pulses. No carotid bruits.  Abdomen: no tenderness, no masses palpated. No hepatosplenomegaly. Bowel sounds positive.  Musculoskeletal: no clubbing / cyanosis. No joint deformity upper and lower extremities. Good ROM, no contractures. Normal muscle tone.  Skin: no rashes, lesions, ulcers. No induration Neurologic: CN 2-12 grossly intact. Sensation intact, DTR normal. Strength 5/5 in all 4.  Psychiatric: Normal judgment and insight. Alert and oriented x 3. Normal mood.    Labs on Admission: I have personally reviewed following labs and imaging studies  CBC:  Recent Labs Lab 09/19/16 2320  WBC 15.5*  NEUTROABS 13.5*  HGB 9.8*  HCT 30.8*  MCV 90.6  PLT 429*   Basic Metabolic Panel:  Recent Labs Lab 09/19/16 2320 09/20/16 0050  NA 133*  --   K 6.1* 4.0  CL 98*  --   CO2 26  --   GLUCOSE 84  --   BUN 39*  --   CREATININE 1.91*  --   CALCIUM 8.7*  --    GFR: Estimated Creatinine Clearance: 25.7 mL/min (by C-G formula based on SCr of 1.91 mg/dL (H)). Liver Function Tests:  Recent Labs Lab 09/19/16 2320  AST 67*  ALT 24  ALKPHOS 66  BILITOT 1.9*  PROT 6.9  ALBUMIN 2.9*   No results for input(s): LIPASE,  AMYLASE in the last 168 hours. No results for input(s): AMMONIA in the last 168 hours. Coagulation Profile: No results for input(s): INR, PROTIME in the last 168 hours. Cardiac Enzymes: No results for input(s): CKTOTAL, CKMB, CKMBINDEX, TROPONINI in the last 168 hours. BNP (last 3 results) No results for input(s): PROBNP in the last 8760 hours. HbA1C: No results for input(s): HGBA1C in the last 72 hours. CBG:  Recent Labs Lab 09/19/16 2259 09/20/16 0037 09/20/16 0125  GLUCAP 111* 47* 153*   Lipid Profile: No results for input(s): CHOL, HDL, LDLCALC, TRIG, CHOLHDL, LDLDIRECT in the last 72 hours. Thyroid Function Tests: No results for input(s): TSH, T4TOTAL, FREET4, T3FREE, THYROIDAB in the last 72 hours. Anemia Panel: No results for input(s): VITAMINB12, FOLATE, FERRITIN, TIBC, IRON, RETICCTPCT in the last 72 hours. Urine analysis:    Component Value Date/Time   COLORURINE YELLOW 09/19/2016 2321   APPEARANCEUR CLOUDY (A) 09/19/2016 2321   LABSPEC 1.010 09/19/2016 2321   PHURINE 5.0 09/19/2016 2321   GLUCOSEU NEGATIVE 09/19/2016 2321   HGBUR NEGATIVE 09/19/2016 2321   BILIRUBINUR NEGATIVE 09/19/2016 2321   KETONESUR NEGATIVE 09/19/2016 2321   PROTEINUR NEGATIVE 09/19/2016 2321   UROBILINOGEN 0.2 07/27/2015 1650   NITRITE NEGATIVE 09/19/2016 2321   LEUKOCYTESUR  NEGATIVE 09/19/2016 2321   Sepsis Labs: @LABRCNTIP (procalcitonin:4,lacticidven:4) )No results found for this or any previous visit (from the past 240 hour(s)).   Radiological Exams on Admission: Ct Head Wo Contrast  Result Date: 09/20/2016 CLINICAL DATA:  Altered mental status EXAM: CT HEAD WITHOUT CONTRAST TECHNIQUE: Contiguous axial images were obtained from the base of the skull through the vertex without intravenous contrast. COMPARISON:  Brain MRI 09/06/2016 FINDINGS: Brain: No mass lesion, intraparenchymal hemorrhage or extra-axial collection. No evidence of acute cortical infarct. Brain parenchyma and  CSF-containing spaces are unremarkable for age. Vascular: No hyperdense vessel or unexpected calcification. Skull: Normal visualized skull base, calvarium and extracranial soft tissues. Sinuses/Orbits: No sinus fluid levels or advanced mucosal thickening. No mastoid effusion. Normal orbits. IMPRESSION: No acute intracranial abnormality. Electronically Signed   By: Deatra RobinsonKevin  Herman M.D.   On: 09/20/2016 00:47   Dg Chest Portable 1 View  Result Date: 09/20/2016 CLINICAL DATA:  Altered mental status.  Shortness of breath. EXAM: PORTABLE CHEST 1 VIEW COMPARISON:  09/08/2016 FINDINGS: Postoperative changes in the mediastinum. Tracheostomy. Shallow inspiration. Cardiac enlargement with bilateral interstitial infiltration consistent with interstitial edema. Findings are similar to previous study. No pleural effusions. No pneumothorax. Calcified and tortuous aorta. IMPRESSION: Shallow inspiration. Cardiac enlargement with diffuse pulmonary edema. Electronically Signed   By: Burman NievesWilliam  Stevens M.D.   On: 09/20/2016 00:25    EKG: Independently reviewed.  Assessment/Plan Principal Problem:   Toxic metabolic encephalopathy Active Problems:   Chronic combined systolic and diastolic CHF, NYHA class 2 (EF 30-35%) per ECHO 2017   Hypertension   Chronic respiratory failure with hypoxia (HCC)   Hypoglycemia    1. Toxic metabolic encephalopathy - 1. multifactorial due to hypoglycemia as well as opiates. 2. Improved after BGL improved and narcan administered, patient now responding appropriately 2. Hypoglycemia - 1. Holding insulin 2. CBG checks Q1H 3. D10 at 25 cc/hr 3. HTN - continue home meds 4. Chronic CHF - CXR actually shows some pulmonary vascular congestion today 1. Continue home meds 2. Minimize IVF as she is already borderline fluid overloaded on CXR today 5. Chronic resp failure - 1. Trach care ordered   DVT prophylaxis: Coumadin Code Status: Full Family Communication: No family in  room Consults called: None Admission status: Admit to obs   Keonta Monceaux, Heywood IlesJARED M. DO [facility] Pager [phone] from 7PM-7AM  If 7AM-7PM, please contact the day physician for the patient [email or web address] Password [email or web address]  09/20/2016, 2:38 AM

## 2016-09-20 NOTE — Progress Notes (Signed)
RT Note: Rt was called to set the patient up on her aerosol set up. RN had all of the stand by supplies at bedside and the [facility] as well. Patient continues to maintain well on her trach collar. Patient placed her PMSV on and when she exhales she makes a 'duck quacking" noise. The patient said that the noise has been going on for several months when she wears her PMSV. Rt asked if she mentioned it to her ENT or primary physician and she stated that she keeps forgetting to. RT called and spoke to Dr. Joseph ArtWoods and notified him of the noise and of what the patient stated and he said that he will address it. Patient does not appear to be in any distress while wearing her PMSV or when the noise is occurring. Rt will continue to monitor the patient.

## 2016-09-20 NOTE — ED Notes (Signed)
Speech therapy is at the bedside.

## 2016-09-20 NOTE — ED Notes (Signed)
cbg was 181

## 2016-09-20 NOTE — Evaluation (Signed)
Clinical/Bedside Swallow Evaluation Patient Details  Name: [name] MRN: [id number] Date of Birth: 10/19/1946  Today's Date: 09/20/2016 Time: SLP Start Time (ACUTE ONLY): 1610 SLP Stop Time (ACUTE ONLY): 1627 SLP Time Calculation (min) (ACUTE ONLY): 17 min  Past Medical History:  Past Medical History:  Diagnosis Date  . Anemia   . Arthritis    "hands, back" (06/28/2016)  . BRBPR (bright red blood per rectum) 06/28/2016  . CAD (coronary artery disease)    a. S/p CABG and stenting // b. Myoview 3/16: high risk EF 25%, ant-apical AK, ant scar with peri-infarct ischemia // c. LHC 3/16 - EF 45%, inferior hypokinesis, oLAD 100, oLCx 40, branch vessel 50, pRCA 100 dRCA , RV br 90, S-RCA occluded, S-LAD patent with inf-apical LAD beyond graft 70 >> med Rx  . Carotid artery disease (HCC)    a. s/p L CEA // b. Carotid US 4/16: R 40-59%; L CEA patent with 40-59%  . Chronic combined systolic and diastolic CHF (congestive heart failure) (HCC)    a. 11/2011 Echo with EF 30-35%, global hypokinesis, and inferior akinesis;  b. 12/2014 Echo: EF 50%, mod LVH, Ao sclerosis w/o stenosis, mildly dil LA, mild to mod RV dysfxn. // c. Echo 10/16: Moderate LVH, EF 35-40%, inferior AK, grade 1 diastolic dysfunction, MAC, mild MR, moderate LAE  . Chronic lower back pain   . CKD (chronic kidney disease), stage III   . COPD (chronic obstructive pulmonary disease) (HCC)    a. prn and HS supplemental O2.  . DDD (degenerative disc disease), lumbar   . Depression   . GERD (gastroesophageal reflux disease)   . History of blood transfusion ~ 2015; 06/28/2016   "anemia"  . History of IBS   . History of tracheostomy 08/26/2013   Dr. Jenne PaneBates  . Hyperlipidemia   . Hypertension   . Hypothyroidism    Goiter  . MI (myocardial infarction) 1997  . On home oxygen therapy    "4L at night & prn during the day" (06/28/2016)  . PAD (peripheral artery disease) (HCC)   . Stroke [facility](HCC)    MRI 11/2011 with remote occipital lobe.  MRA with moderate left focal vertebral artery stenosis  . Type II diabetes mellitus (HCC)    Past Surgical History:  Past Surgical History:  Procedure Laterality Date  . ABDOMINAL AORTAGRAM N/A 04/05/2013   Procedure: ABDOMINAL Ronny FlurryAORTAGRAM;  Surgeon: Sherren Kernsharles E Fields, MD;  [facility];  Service: Cardiovascular;  Laterality: N/A;  . WGNFAOZHYQM  [id number]  0815-2012   Aortogram by Dr. Italyhad McKenzie [facility]  . CAROTID ENDARTERECTOMY Left ~2008  . CARPAL TUNNEL RELEASE Left   . CATARACT EXTRACTION W/ INTRAOCULAR LENS IMPLANT & ANTERIOR VITRECTOMY, BILATERAL  ~2007-02/2016   "left-right"  . CHOLECYSTECTOMY OPEN    . COLONOSCOPY N/A 10/27/2014   Procedure: COLONOSCOPY;  Surgeon: [name] ModenaWilliam Outlaw, MD;  [facility];  Service: Endoscopy;  Laterality: N/A;  . CORONARY ARTERY BYPASS GRAFT     2 vessel  . DILATION AND CURETTAGE OF UTERUS    . FEMORAL-TIBIAL BYPASS GRAFT Left 06/11/2013   Procedure: BYPASS GRAFT  LEFT FEMORAL- POSTERIOR TIBIAL ARTERY/ REDO;  Surgeon: Sherren Kernsharles E Fields, MD;  [facility];  Service: Vascular;  Laterality: Left;  . LEFT AND RIGHT HEART CATHETERIZATION WITH CORONARY ANGIOGRAM N/A 01/09/2015   Procedure: LEFT AND RIGHT HEART CATHETERIZATION WITH CORONARY ANGIOGRAM;  Surgeon: Marykay Lexavid W Harding, MD;  [facility];  Service: Cardiovascular;  Laterality: N/A;  . PR  VEIN BYPASS GRAFT,AORTO-FEM-POP     Right common femoral-AK popliteal BPG & Right Popliteal-posterior tibial  . PR VEIN BYPASS GRAFT,AORTO-FEM-POP     Left Fem-pop BPG  . PTCA    . SPINE SURGERY  Oct. 27, 2014   Injection - Back  . THROMBECTOMY FEMORAL ARTERY Left 06/11/2013   Procedure: THROMBECTOMY FEMORAL ARTERY;  Surgeon: Sherren Kernsharles E Fields, MD;  [facility];  Service: Vascular;  Laterality: Left;  . THYROIDECTOMY    . TRACHEOSTOMY TUBE PLACEMENT  01/02/2012  . TUBAL LIGATION     HPI:  70 year old female with chronic trach (March 2013) secondary to tracheal stenosis from remote ETT.  PMH: CAD, DM, COPD, stroke, HTN, recent calf pain. Pt admitted due to AMS, toxic metalbolic encephalopathy. CXR Shallow inspiration. Cardiac enlargement with diffuse pulmonary.   Assessment / Plan / Recommendation Clinical Impression  Swallow assessment performed with speaking valve donned. She denies dysphagia and no documentation of prior swallow evaluations found. She did elicit 2-3 delayed coughs which was difficult to associate with one particular consistency. CXR without indications of pna. Recommend regular texture, thin liquids with valve on, pills with thin liquids, straws allowed. ST will see once more for tolerance and efficiency with po's.      Aspiration Risk  Mild aspiration risk    Diet Recommendation Regular;Thin liquid   Liquid Administration via: Cup;Straw Medication Administration: Whole meds with liquid Supervision: Patient able to self feed Compensations: Slow rate;Small sips/bites Postural Changes: Seated upright at 90 degrees;Remain upright for at least 30 minutes after po intake    Other  Recommendations Oral Care Recommendations: Oral care BID   Follow up Recommendations  (TBD)      Frequency and Duration min 1 x/week  1 week       Prognosis Prognosis for Safe Diet Advancement: Good      Swallow Study   General HPI: 70 year old female with chronic trach (March 2013) secondary to tracheal stenosis from remote ETT. PMH: CAD, DM, COPD, stroke, HTN, recent calf pain. Pt admitted due to AMS, toxic metalbolic encephalopathy. CXR Shallow inspiration. Cardiac enlargement with diffuse pulmonary. Type of Study: Bedside Swallow Evaluation Previous Swallow Assessment:  (none found) Diet Prior to this Study: NPO Temperature Spikes Noted: No Respiratory Status: Trach Trach Size and Type: #6;Uncuffed;With PMSV in place History of Recent Intubation: No Behavior/Cognition: Alert;Cooperative Oral Cavity Assessment: Within Functional Limits Oral Care Completed by SLP:  No Oral Cavity - Dentition:  (missing upper, has 5 lower) Vision: Functional for self-feeding Self-Feeding Abilities: Able to feed self Patient Positioning: Upright in bed Baseline Vocal Quality: Normal Volitional Cough: Strong Volitional Swallow: Able to elicit    Oral/Motor/Sensory Function Overall Oral Motor/Sensory Function: Within functional limits   Ice Chips Ice chips: Not tested   Thin Liquid Thin Liquid: Impaired Presentation: Straw Pharyngeal  Phase Impairments: Cough - Delayed    Nectar Thick Nectar Thick Liquid: Not tested   Honey Thick Honey Thick Liquid: Not tested   Puree Puree: Within functional limits   Solid   GO   Solid: Impaired Pharyngeal Phase Impairments: Cough - Delayed    Functional Assessment Tool Used: skilled clinical judgement Functional Limitations: Swallowing Swallow Current Status ([id number]): At least 20 percent but less than 40 percent impaired, limited or restricted Swallow Goal Status [phone]([id number]): At least 1 percent but less than 20 percent impaired, limited or restricted   [name], [name] 09/20/2016,4:47 PM  [name] M.Ed [employer] Pager [phone]

## 2016-09-20 NOTE — ED Notes (Addendum)
Pt assisted off bedpan, had a void and bm. Pts linens changed and repositioned.

## 2016-09-20 NOTE — ED Notes (Signed)
Tray order

## 2016-09-20 NOTE — ED Notes (Signed)
Pt became more Alert after Narcan IV given, pt able to states her age and her name.

## 2016-09-20 NOTE — ED Notes (Signed)
Pt.s daughter is with the pt.  Updated her on pt.s plan of care. Pt. Gave permission to update her daughter.

## 2016-09-20 NOTE — ED Notes (Signed)
cbg was 56 

## 2016-09-20 NOTE — ED Notes (Signed)
Pt. s cbg is 50, Pt. Is alert and oriented X4.   Called and reported to Dr. Joseph ArtWoods . Orders received to given 1 amp D50.   Also reported to Dr. Joseph ArtWoods that pt. Is having rt. Knee pain and we do not have any orders for pain control.  Also spoke with Dr. Joseph ArtWoods about po medications and if she is able to take anything bu mouth.  When I ask the pt. She stated, "Yes I can swallow pills."

## 2016-09-20 NOTE — Progress Notes (Signed)
ANTICOAGULATION CONSULT NOTE - Follow Up Consult  Pharmacy Consult for heparin/warfarin Indication: pulmonary embolus  Allergies  Allergen Reactions  . Aldactone [Spironolactone] Other (See Comments)    Severe hyperkalemia   . Lisinopril Other (See Comments) and Cough    Hypotension also  . Crestor [Rosuvastatin Calcium] Other (See Comments)    Muscle Pain  . Vicodin [Hydrocodone-Acetaminophen] Nausea And Vomiting   Patient Measurements: Height: 5\' 2"  (157.5 cm) Weight: 161 lb (73 kg) IBW/kg (Calculated) : 50.1 Heparin Dosing Weight: 66 kg  Vital Signs: Temp: 98.4 F (36.9 C) (11/28 1800) Temp Source: Oral (11/28 1800) BP: 122/55 (11/28 1845) Pulse Rate: 79 (11/28 1845)  Labs:  Recent Labs  09/19/16 2320 09/20/16 0305 09/20/16 1815  HGB 9.8*  --   --   HCT 30.8*  --   --   PLT 429*  --   --   LABPROT  --  18.5*  --   INR  --  1.52  --   HEPARINUNFRC  --   --  <0.10*  CREATININE 1.91*  --   --    Estimated Creatinine Clearance: 25.7 mL/min (by C-G formula based on SCr of 1.91 mg/dL (H)).  Assessment: 70 yo f presenting with AMS  PMH: COPD, Chronic Trach, DM, CHF, CVA, hx of PE, hx of GI bleed  Anticoag: warfarin pta for PE. Admit INR 1.52 adding on IV hep for low INR   First Heparin level subtherapeutic: <0.10 - no bleeding per RN  Home warfarin dose alternating 2.5 and 5 mg - 5mg  taken 11/27  Nephro: SCr 1.91  Heme/Onc: H&H 9.8/30.8, Plt 429  Goal of Therapy:  INR 2-3 Heparin level 0.3-0.7 units/ml Monitor platelets by anticoagulation protocol: Yes   Plan:  Heparin 1400 units/hr (no bolus with recent GI bleed) Warfarin 5 mg x 1 Daily INR, CBC, HL Monitor s/sx of bleeding Monitor INR for d/c of hep  Ruben Imony Misty Foutz, PharmD Clinical Pharmacist Pager: [phone] 09/20/2016 8:28 PM

## 2016-09-20 NOTE — ED Notes (Signed)
cbg was 180

## 2016-09-20 NOTE — ED Notes (Signed)
Dr. Joseph ArtWoods ordered a SLP consult.  PO medications will be on hold until the results of the Swallowing.

## 2016-09-20 NOTE — ED Notes (Signed)
cbg was 215 

## 2016-09-20 NOTE — Progress Notes (Signed)
PROGRESS NOTE    Scot JunDorothy I Cregger  ZOX:[id number] DOB: 11/24/1945 DOA: 09/19/2016 PCP: [name],CYNTHIA S, MD   Brief Narrative:  70 y.o. 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   Patient last hospitalized earlier this month.  Patient brought in to ED by EMS after being found minimally responsive less than 1 hour ago.  [facility]  Was initially responding only to painful stimuli.  On EMS arrival patient CBG was 28, she got 25g PO glucose and BGL improved.  ED Course: In the ED patient had to get re-dosed with glucose in the form of D50 due to BGL dropping down to 47.  Despite interventions patient was still altered so EDP tried narcan on the patient.  Narcan worked well and patient had immediate improvement in mental status.  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  Patient indicates that she takes medications and food PO and does not have a PEG tube in place (no PEG tube is noted on exam).    Subjective: 11/28 A/O 3 (does not recall why). [facility] does not recall how much pain medication they administered. States has been on chronic trach since~2013. 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 Sees Dr. Jenne PaneBates (ENT)for chronic trach care.     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 -Patient now A/O 4  Diabetes type 2 uncontrolled with complications/Hypoglycemia -11/10 Hemoglobin A1c= 8.7 -Continue to hold insulin -CBG every hour -ADDENDUM: 11/28 patient has passed swallow evaluation regular diet and liquid - Moderate SSI  HTN -Coreg 12.5 mg BID -Hydralazine 25 mg TID -Imdur 15 mg daily -Torsemide 40 mg daily  Chronic Systolic and Diastolic CHF -See HTN -Strict in and out -Daily weight  Pulmonary hypertension   Chronic respiratory failure (Chronic Tracheostomy) -Stable, trach care ordered  RLE DVT -Heparin -->Coumadin per pharmacy Recent Labs Lab 09/20/16 0305  INR 1.52   Sepsis? -Leukocytosis without fever, left shift, bands believe most likely secondary to Demargination. Will hold off on antibiotics at this time. -Blood, urine cultures pending    DVT prophylaxis: Heparin---> Coumadin Code Status: Full Family Communication: On Disposition Plan: [facility]   Consultants:  None   Procedures/Significant Events:  None   VENTILATOR SETTINGS: None   Cultures None  Antimicrobials: None   Devices None   LINES / TUBES:  Chronic tracheostomy ~2013>>    Continuous Infusions: . dextrose 25 mL/hr at 09/20/16 0823     Objective: Vitals:   09/20/16 0813 09/20/16 0815 09/20/16 0830 09/20/16 0900  BP: 151/84 (!) 148/109 166/57 113/69  Pulse: 88 86 85 90  Resp: 16 22 25 19   Temp:      TempSrc:      SpO2: 97% 98% 94% (!) 88%  Weight:      Height:  Intake/Output Summary (Last 24 hours) at 09/20/16 0932 Last data filed at 09/19/16 2353  Gross per 24 hour  Intake                0 ml  Output              600 ml  Net             -600 ml   Filed Weights   09/19/16 2305  Weight: 73 kg (161 lb)    Examination:  General: A/O 4, NAD, positive chronic respiratory distress with chronic trach Eyes: negative scleral hemorrhage, negative anisocoria,  negative icterus ENT: Negative Runny nose, negative gingival bleeding, Neck:  Negative scars, masses, torticollis, lymphadenopathy, JVD, trachea place with Passy-Muir valve. Lungs: Clear to auscultation bilaterally without wheezes or crackles Cardiovascular: Regular rate and rhythm without murmur gallop or rub normal S1 and S2 Abdomen: negative abdominal pain, nondistended, positive soft, bowel sounds, no rebound, no ascites, no appreciable mass Extremities: No significant cyanosis, clubbing, or edema bilateral lower extremities. RLE pain to palpation posterior knee and calf. Well-healed surgical incision medial aspect of RLE consistent with vein harvesting. Psychiatric:  Negative depression, negative anxiety, negative fatigue, negative mania  Central nervous system:  Cranial nerves II through XII intact, tongue/uvula midline, all extremities muscle strength 5/5, sensation intact throughout, negative dysarthria, negative expressive aphasia, negative receptive aphasia.  .     Data Reviewed: Care during the described time interval was provided by me .  I have reviewed this patient's available data, including medical history, events of note, physical examination, and all test results as part of my evaluation. I have personally reviewed and interpreted all radiology studies.  CBC:  Recent Labs Lab 09/19/16 2320  WBC 15.5*  NEUTROABS 13.5*  HGB 9.8*  HCT 30.8*  MCV 90.6  PLT 429*   Basic Metabolic Panel:  Recent Labs Lab 09/19/16 2320 09/20/16 0050  NA 133*  --   K 6.1* 4.0  CL 98*  --   CO2 26  --   GLUCOSE 84  --   BUN 39*  --   CREATININE 1.91*  --   CALCIUM 8.7*  --    GFR: Estimated Creatinine Clearance: 25.7 mL/min (by C-G formula based on SCr of 1.91 mg/dL (H)). Liver Function Tests:  Recent Labs Lab 09/19/16 2320  AST 67*  ALT 24  ALKPHOS 66  BILITOT 1.9*  PROT 6.9  ALBUMIN 2.9*   No results for input(s): LIPASE, AMYLASE in the last 168 hours. No results  for input(s): AMMONIA in the last 168 hours. Coagulation Profile:  Recent Labs Lab 09/20/16 0305  INR 1.52   Cardiac Enzymes: No results for input(s): CKTOTAL, CKMB, CKMBINDEX, TROPONINI in the last 168 hours. BNP (last 3 results) No results for input(s): PROBNP in the last 8760 hours. HbA1C: No results for input(s): HGBA1C in the last 72 hours. CBG:  Recent Labs Lab 09/19/16 2259 09/20/16 0037 09/20/16 0125 09/20/16 0806 09/20/16 0909  GLUCAP 111* 47* 153* 56* 179*   Lipid Profile: No results for input(s): CHOL, HDL, LDLCALC, TRIG, CHOLHDL, LDLDIRECT in the last 72 hours. Thyroid Function Tests: No results for input(s): TSH, T4TOTAL, FREET4, T3FREE, THYROIDAB in the last 72 hours. Anemia Panel: No results for input(s): VITAMINB12, FOLATE, FERRITIN, TIBC, IRON, RETICCTPCT in the last 72 hours. Urine analysis:    Component Value Date/Time   COLORURINE YELLOW 09/19/2016 2321   APPEARANCEUR CLOUDY (A) 09/19/2016 2321   LABSPEC 1.010 09/19/2016 2321  PHURINE 5.0 09/19/2016 2321   GLUCOSEU NEGATIVE 09/19/2016 2321   HGBUR NEGATIVE 09/19/2016 2321   BILIRUBINUR NEGATIVE 09/19/2016 2321   KETONESUR NEGATIVE 09/19/2016 2321   PROTEINUR NEGATIVE 09/19/2016 2321   UROBILINOGEN 0.2 07/27/2015 1650   NITRITE NEGATIVE 09/19/2016 2321   LEUKOCYTESUR NEGATIVE 09/19/2016 2321   Sepsis Labs: @LABRCNTIP (procalcitonin:4,lacticidven:4)  )No results found for this or any previous visit (from the past 240 hour(s)).       Radiology Studies: Ct Head Wo Contrast  Result Date: 09/20/2016 CLINICAL DATA:  Altered mental status EXAM: CT HEAD WITHOUT CONTRAST TECHNIQUE: Contiguous axial images were obtained from the base of the skull through the vertex without intravenous contrast. COMPARISON:  Brain MRI 09/06/2016 FINDINGS: Brain: No mass lesion, intraparenchymal hemorrhage or extra-axial collection. No evidence of acute cortical infarct. Brain parenchyma and CSF-containing spaces are  unremarkable for age. Vascular: No hyperdense vessel or unexpected calcification. Skull: Normal visualized skull base, calvarium and extracranial soft tissues. Sinuses/Orbits: No sinus fluid levels or advanced mucosal thickening. No mastoid effusion. Normal orbits. IMPRESSION: No acute intracranial abnormality. Electronically Signed   By: Deatra RobinsonKevin  Herman M.D.   On: 09/20/2016 00:47   Dg Chest Portable 1 View  Result Date: 09/20/2016 CLINICAL DATA:  Altered mental status.  Shortness of breath. EXAM: PORTABLE CHEST 1 VIEW COMPARISON:  09/08/2016 FINDINGS: Postoperative changes in the mediastinum. Tracheostomy. Shallow inspiration. Cardiac enlargement with bilateral interstitial infiltration consistent with interstitial edema. Findings are similar to previous study. No pleural effusions. No pneumothorax. Calcified and tortuous aorta. IMPRESSION: Shallow inspiration. Cardiac enlargement with diffuse pulmonary edema. Electronically Signed   By: Burman NievesWilliam  Stevens M.D.   On: 09/20/2016 00:25        Scheduled Meds: . aspirin EC  81 mg Oral Daily  . budesonide  0.25 mg Nebulization BID  . carvedilol  12.5 mg Oral BID WC  . dicyclomine  20 mg Oral TID AC  . DULoxetine  60 mg Oral QHS  . ferrous sulfate  325 mg Oral Q breakfast  . gabapentin  300 mg Oral QHS  . hydrALAZINE  25 mg Oral Q8H  . isosorbide mononitrate  15 mg Oral Daily  . levothyroxine  75 mcg Oral QAC breakfast  . multivitamin with minerals  1 tablet Oral Daily  . pantoprazole  40 mg Oral Daily  . simvastatin  40 mg Oral QPM  . tiZANidine  2 mg Oral Daily  . torsemide  40 mg Oral Daily  . warfarin  5 mg Oral ONCE-1800  . Warfarin - Pharmacist Dosing Inpatient   Does not apply q1800   Continuous Infusions: . dextrose 25 mL/hr at 09/20/16 0823     LOS: 0 days    Time spent: 40 minutes    Eliav Mechling, Roselind MessierURTIS J, MD [facility] Pager [phone]   If 7PM-7AM, please contact night-coverage [email or web address] Password  [email or web address] 09/20/2016, 9:32 AM

## 2016-09-20 NOTE — ED Notes (Signed)
Pt.s dinner tray arrived to the ED.  Sending the tray to pt.s room.

## 2016-09-20 NOTE — ED Notes (Signed)
Pt had and incontinent void and bm. [place], patient repositioned and linens changed.

## 2016-09-21 DIAGNOSIS — I10 Essential (primary) hypertension: Secondary | ICD-10-CM

## 2016-09-21 DIAGNOSIS — E118 Type 2 diabetes mellitus with unspecified complications: Secondary | ICD-10-CM

## 2016-09-21 DIAGNOSIS — G92 Toxic encephalopathy: Secondary | ICD-10-CM

## 2016-09-21 DIAGNOSIS — Z794 Long term (current) use of insulin: Secondary | ICD-10-CM

## 2016-09-21 DIAGNOSIS — R7881 Bacteremia: Secondary | ICD-10-CM

## 2016-09-21 DIAGNOSIS — E1165 Type 2 diabetes mellitus with hyperglycemia: Secondary | ICD-10-CM

## 2016-09-21 DIAGNOSIS — I824Z1 Acute embolism and thrombosis of unspecified deep veins of right distal lower extremity: Secondary | ICD-10-CM

## 2016-09-21 LAB — BLOOD CULTURE ID PANEL (REFLEXED)
ACINETOBACTER BAUMANNII: NOT DETECTED
CANDIDA ALBICANS: NOT DETECTED
CANDIDA TROPICALIS: NOT DETECTED
Candida glabrata: NOT DETECTED
Candida krusei: NOT DETECTED
Candida parapsilosis: NOT DETECTED
ENTEROBACTER CLOACAE COMPLEX: NOT DETECTED
Enterobacteriaceae species: NOT DETECTED
Enterococcus species: NOT DETECTED
Escherichia coli: NOT DETECTED
Haemophilus influenzae: NOT DETECTED
Klebsiella oxytoca: NOT DETECTED
Klebsiella pneumoniae: NOT DETECTED
LISTERIA MONOCYTOGENES: NOT DETECTED
NEISSERIA MENINGITIDIS: NOT DETECTED
Proteus species: NOT DETECTED
Pseudomonas aeruginosa: NOT DETECTED
SERRATIA MARCESCENS: NOT DETECTED
STAPHYLOCOCCUS AUREUS BCID: NOT DETECTED
STREPTOCOCCUS AGALACTIAE: NOT DETECTED
STREPTOCOCCUS PYOGENES: NOT DETECTED
STREPTOCOCCUS SPECIES: DETECTED — AB
Staphylococcus species: NOT DETECTED
Streptococcus pneumoniae: NOT DETECTED

## 2016-09-21 LAB — CBC
HEMATOCRIT: 29.4 % — AB (ref 36.0–46.0)
HEMOGLOBIN: 9.2 g/dL — AB (ref 12.0–15.0)
MCH: 28.8 pg (ref 26.0–34.0)
MCHC: 31.3 g/dL (ref 30.0–36.0)
MCV: 91.9 fL (ref 78.0–100.0)
Platelets: 392 10*3/uL (ref 150–400)
RBC: 3.2 MIL/uL — AB (ref 3.87–5.11)
RDW: 16.8 % — ABNORMAL HIGH (ref 11.5–15.5)
WBC: 12.6 10*3/uL — AB (ref 4.0–10.5)

## 2016-09-21 LAB — COMPREHENSIVE METABOLIC PANEL
ALK PHOS: 62 U/L (ref 38–126)
ALT: 15 U/L (ref 14–54)
AST: 18 U/L (ref 15–41)
Albumin: 2.4 g/dL — ABNORMAL LOW (ref 3.5–5.0)
Anion gap: 9 (ref 5–15)
BUN: 23 mg/dL — AB (ref 6–20)
CALCIUM: 8.1 mg/dL — AB (ref 8.9–10.3)
CHLORIDE: 104 mmol/L (ref 101–111)
CO2: 27 mmol/L (ref 22–32)
CREATININE: 1.33 mg/dL — AB (ref 0.44–1.00)
GFR calc non Af Amer: 39 mL/min — ABNORMAL LOW (ref >60)
GFR, EST AFRICAN AMERICAN: 46 mL/min — AB (ref >60)
GLUCOSE: 167 mg/dL — AB (ref 65–99)
Potassium: 3.9 mmol/L (ref 3.5–5.1)
SODIUM: 140 mmol/L (ref 135–145)
Total Bilirubin: 0.5 mg/dL (ref 0.3–1.2)
Total Protein: 6 g/dL — ABNORMAL LOW (ref 6.5–8.1)

## 2016-09-21 LAB — HEPARIN LEVEL (UNFRACTIONATED): Heparin Unfractionated: 0.48 IU/mL (ref 0.30–0.70)

## 2016-09-21 LAB — GLUCOSE, CAPILLARY
GLUCOSE-CAPILLARY: 145 mg/dL — AB (ref 65–99)
Glucose-Capillary: 164 mg/dL — ABNORMAL HIGH (ref 65–99)
Glucose-Capillary: 122 mg/dL — ABNORMAL HIGH (ref 65–99)
Glucose-Capillary: 215 mg/dL — ABNORMAL HIGH (ref 65–99)
Glucose-Capillary: 196 mg/dL — ABNORMAL HIGH (ref 65–99)
Glucose-Capillary: 197 mg/dL — ABNORMAL HIGH (ref 65–99)

## 2016-09-21 LAB — PROTIME-INR
INR: 2.05
PROTHROMBIN TIME: 23.4 s — AB (ref 11.4–15.2)

## 2016-09-21 MED ORDER — OXYCODONE-ACETAMINOPHEN 5-325 MG PO TABS
1.0000 | ORAL_TABLET | Freq: Four times a day (QID) | ORAL | Status: DC | PRN
  Administered 2016-09-21 – 2016-09-22 (×4): 1 via ORAL
  Filled 2016-09-21 (×4): qty 1

## 2016-09-21 MED ORDER — DEXTROSE 5 % IV SOLN
2.0000 g | INTRAVENOUS | Status: DC
  Administered 2016-09-21 – 2016-09-22 (×2): 2 g via INTRAVENOUS
  Filled 2016-09-21 (×2): qty 2

## 2016-09-21 MED ORDER — INSULIN GLARGINE 100 UNIT/ML ~~LOC~~ SOLN
5.0000 [IU] | Freq: Every day | SUBCUTANEOUS | Status: DC
  Administered 2016-09-21 – 2016-10-07 (×15): 5 [IU] via SUBCUTANEOUS
  Filled 2016-09-21 (×17): qty 0.05

## 2016-09-21 MED ORDER — WARFARIN SODIUM 2.5 MG PO TABS
2.5000 mg | ORAL_TABLET | Freq: Once | ORAL | Status: AC
  Administered 2016-09-21: 2.5 mg via ORAL
  Filled 2016-09-21: qty 1

## 2016-09-21 NOTE — Progress Notes (Signed)
PHARMACY - PHYSICIAN COMMUNICATION CRITICAL VALUE ALERT - BLOOD CULTURE IDENTIFICATION (BCID)  Results for orders placed or performed during the hospital encounter of 09/19/16  Blood Culture ID Panel (Reflexed) (Collected: 09/19/2016 11:44 PM)  Result Value Ref Range   Enterococcus species NOT DETECTED NOT DETECTED   Listeria monocytogenes NOT DETECTED NOT DETECTED   Staphylococcus species NOT DETECTED NOT DETECTED   Staphylococcus aureus NOT DETECTED NOT DETECTED   Streptococcus species DETECTED (A) NOT DETECTED   Streptococcus agalactiae NOT DETECTED NOT DETECTED   Streptococcus pneumoniae NOT DETECTED NOT DETECTED   Streptococcus pyogenes NOT DETECTED NOT DETECTED   Acinetobacter baumannii NOT DETECTED NOT DETECTED   Enterobacteriaceae species NOT DETECTED NOT DETECTED   Enterobacter cloacae complex NOT DETECTED NOT DETECTED   Escherichia coli NOT DETECTED NOT DETECTED   Klebsiella oxytoca NOT DETECTED NOT DETECTED   Klebsiella pneumoniae NOT DETECTED NOT DETECTED   Proteus species NOT DETECTED NOT DETECTED   Serratia marcescens NOT DETECTED NOT DETECTED   Haemophilus influenzae NOT DETECTED NOT DETECTED   Neisseria meningitidis NOT DETECTED NOT DETECTED   Pseudomonas aeruginosa NOT DETECTED NOT DETECTED   Candida albicans NOT DETECTED NOT DETECTED   Candida glabrata NOT DETECTED NOT DETECTED   Candida krusei NOT DETECTED NOT DETECTED   Candida parapsilosis NOT DETECTED NOT DETECTED   Candida tropicalis NOT DETECTED NOT DETECTED    Name of physician (or Provider) Contacted:   K Schorr  Changes to prescribed antibiotics required:   Suggest starting Rocephin 2 g IV q24h  Eddie Candlebbott, Jolan Mealor Vernon 09/21/2016  4:59 AM

## 2016-09-21 NOTE — Progress Notes (Signed)
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 CBG was noted to be 28. There was also some concern for narcotic overdose. She had good response to Narcan. Patient was hospitalized for further management.  Reason for Visit: Altered mental status  Consultants: None  Procedures: None  Antibiotics: None  Subjective/Interval History: Patient complains of pain in her right leg from her blood clot. Denies any shortness of breath. Denies any nausea, vomiting. No chest pain.  ROS: Denies headaches. No dizziness.  Objective:  Vital Signs  Vitals:   09/20/16 2330 09/21/16 0305 09/21/16 0643 09/21/16 0646  BP:    (!) 151/56  Pulse: 81 82  87  Resp: 18 18  20   Temp:    99.3 F (37.4 C)  TempSrc:    Oral  SpO2: 100% 100%  100%  Weight:   73.9 kg (162 lb 14.4 oz)   Height:        Intake/Output Summary (Last 24 hours) at 09/21/16 1356 Last data filed at 09/21/16 [id number]  Gross per 24 hour  Intake           853.97 ml  Output              100 ml  Net           753.97 ml   Filed Weights   09/19/16 2305 09/21/16 0643  Weight: 73 kg (161 lb) 73.9 kg (162 lb 14.4 oz)    General appearance: alert, cooperative, appears stated age and no distress Tracheostomy noted Resp: clear to auscultation bilaterally Cardio: regular rate and rhythm, S1, S2 normal, no murmur, click, rub or gallop GI: soft, non-tender; bowel sounds normal; no masses,  no organomegaly Extremities: extremities normal, atraumatic, no cyanosis or edema Neurologic: Awake and alert.  Oriented 3.  Lab Results:  Data Reviewed: I have personally reviewed following labs and imaging studies  CBC:  Recent Labs Lab 09/19/16 2320 09/21/16 0559  WBC 15.5* 12.6*  NEUTROABS 13.5*  --   HGB 9.8* 9.2*  HCT 30.8* 29.4*  MCV 90.6 91.9  PLT 429* 392    Basic Metabolic Panel:  Recent Labs Lab 09/19/16 2320 09/20/16 0050 09/21/16 0957  NA 133*  --  140  K 6.1* 4.0 3.9  CL 98*  --  104  CO2 26  --  27  GLUCOSE 84  --  167*  BUN 39*  --  23*  CREATININE 1.91*  --  1.33*  CALCIUM 8.7*  --  8.1*    GFR: Estimated Creatinine Clearance: 37 mL/min (by C-G formula based on SCr of 1.33 mg/dL (H)).  Liver Function Tests:  Recent Labs Lab 09/19/16 2320 09/21/16 0957  AST 67* 18  ALT 24 15  ALKPHOS 66 62  BILITOT 1.9* 0.5  PROT 6.9 6.0*  ALBUMIN 2.9* 2.4*    Coagulation Profile:  Recent Labs Lab 09/20/16 0305 09/21/16 0559  INR 1.52 2.05    CBG:  Recent Labs Lab 09/20/16 2127 09/21/16 0158 09/21/16 0445 09/21/16 0757 09/21/16 1145  GLUCAP 241* 164* 122* 145* 215*     Recent Results (from the past 240 hour(s))  Urine culture     Status: Abnormal (Preliminary result)   Collection Time: 09/19/16 11:21 PM  Result Value Ref Range Status   Specimen Description URINE, CATHETERIZED  Final   Special Requests NONE  Final   Culture >=100,000 COLONIES/mL KLEBSIELLA PNEUMONIAE (A)  Final   Report Status PENDING  Incomplete  Blood culture (routine x 2)     Status: Abnormal (Preliminary result)   Collection Time: 09/19/16 11:44 PM  Result Value Ref Range Status   Specimen Description BLOOD RIGHT ARM  Final   Special Requests BOTTLES DRAWN AEROBIC AND ANAEROBIC  Final   Culture  Setup Time   Final    GRAM POSITIVE COCCI IN PAIRS IN CHAINS ANAEROBIC BOTTLE ONLY Organism ID to follow CRITICAL RESULT CALLED TO, READ BACK BY AND VERIFIED WITH: GREG ABBOTT,PHARMD @0436  09/21/16 MKELLY,MLT    Culture (A)  Final    VIRIDANS STREPTOCOCCUS THE  SIGNIFICANCE OF ISOLATING THIS ORGANISM FROM A SINGLE SET OF BLOOD CULTURES WHEN MULTIPLE SETS ARE DRAWN IS UNCERTAIN. PLEASE NOTIFY THE MICROBIOLOGY DEPARTMENT WITHIN ONE WEEK IF SPECIATION AND SENSITIVITIES ARE REQUIRED.    Report Status PENDING  Incomplete  Blood Culture ID Panel (Reflexed)     Status: Abnormal   Collection Time: 09/19/16 11:44 PM  Result Value Ref Range Status   Enterococcus species NOT DETECTED NOT DETECTED Final   Listeria monocytogenes NOT DETECTED NOT DETECTED Final   Staphylococcus species NOT DETECTED NOT DETECTED Final   Staphylococcus aureus NOT DETECTED NOT DETECTED Final   Streptococcus species DETECTED (A) NOT DETECTED Final    Comment: CRITICAL RESULT CALLED TO, READ BACK BY AND VERIFIED WITH: GREG ABBOTT, PHARMD @0436  09/21/16 MKELLY,MLT    Streptococcus agalactiae NOT DETECTED NOT DETECTED Final   Streptococcus pneumoniae NOT DETECTED NOT DETECTED Final   Streptococcus pyogenes NOT DETECTED NOT DETECTED Final   Acinetobacter baumannii NOT DETECTED NOT DETECTED Final   Enterobacteriaceae species NOT DETECTED NOT DETECTED Final   Enterobacter cloacae complex NOT DETECTED NOT DETECTED Final   Escherichia coli NOT DETECTED NOT DETECTED Final   Klebsiella oxytoca NOT DETECTED NOT DETECTED Final   Klebsiella pneumoniae NOT DETECTED NOT DETECTED Final   Proteus species NOT DETECTED NOT DETECTED Final   Serratia marcescens NOT DETECTED NOT DETECTED Final   Haemophilus influenzae NOT DETECTED NOT DETECTED Final   Neisseria meningitidis NOT DETECTED NOT DETECTED Final   Pseudomonas aeruginosa NOT DETECTED NOT DETECTED Final   Candida albicans NOT DETECTED NOT DETECTED Final   Candida glabrata NOT DETECTED NOT DETECTED Final   Candida krusei NOT DETECTED NOT DETECTED Final   Candida parapsilosis NOT DETECTED NOT DETECTED Final   Candida tropicalis NOT DETECTED NOT DETECTED Final  MRSA PCR Screening     Status: None   Collection Time: 09/20/16  6:14 PM    Result Value Ref Range Status   MRSA by PCR NEGATIVE NEGATIVE Final    Comment:        The GeneXpert MRSA Assay (FDA approved for NASAL specimens only), is one component of a comprehensive MRSA colonization surveillance program. It is not intended to diagnose MRSA infection nor to guide or monitor treatment for MRSA infections.       Radiology Studies: Ct Head Wo Contrast  Result Date: 09/20/2016 CLINICAL DATA:  Altered mental status EXAM: CT HEAD WITHOUT CONTRAST TECHNIQUE: Contiguous axial images were obtained from the base of the skull through the vertex without intravenous contrast. COMPARISON:  Brain MRI 09/06/2016 FINDINGS: Brain: No mass lesion, intraparenchymal  hemorrhage or extra-axial collection. No evidence of acute cortical infarct. Brain parenchyma and CSF-containing spaces are unremarkable for age. Vascular: No hyperdense vessel or unexpected calcification. Skull: Normal visualized skull base, calvarium and extracranial soft tissues. Sinuses/Orbits: No sinus fluid levels or advanced mucosal thickening. No mastoid effusion. Normal orbits. IMPRESSION: No acute intracranial abnormality. Electronically Signed   By: Deatra RobinsonKevin  Herman M.D.   On: 09/20/2016 00:47   Dg Chest Portable 1 View  Result Date: 09/20/2016 CLINICAL DATA:  Altered mental status.  Shortness of breath. EXAM: PORTABLE CHEST 1 VIEW COMPARISON:  09/08/2016 FINDINGS: Postoperative changes in the mediastinum. Tracheostomy. Shallow inspiration. Cardiac enlargement with bilateral interstitial infiltration consistent with interstitial edema. Findings are similar to previous study. No pleural effusions. No pneumothorax. Calcified and tortuous aorta. IMPRESSION: Shallow inspiration. Cardiac enlargement with diffuse pulmonary edema. Electronically Signed   By: Burman NievesWilliam  Stevens M.D.   On: 09/20/2016 00:25     Medications:  Scheduled: . aspirin EC  81 mg Oral Daily  . budesonide  0.25 mg Nebulization BID  . carvedilol   12.5 mg Oral BID WC  . cefTRIAXone (ROCEPHIN)  IV  2 g Intravenous Q24H  . dicyclomine  20 mg Oral TID AC  . DULoxetine  60 mg Oral QHS  . ferrous sulfate  325 mg Oral Q breakfast  . gabapentin  300 mg Oral QHS  . hydrALAZINE  25 mg Oral Q8H  . insulin aspart  0-15 Units Subcutaneous Q4H  . insulin glargine  5 Units Subcutaneous Daily  . isosorbide mononitrate  15 mg Oral Daily  . levothyroxine  75 mcg Oral QAC breakfast  . multivitamin with minerals  1 tablet Oral Daily  . pantoprazole  40 mg Oral Daily  . simvastatin  40 mg Oral QPM  . tiZANidine  2 mg Oral Daily  . torsemide  40 mg Oral Daily  . warfarin  2.5 mg Oral ONCE-1800  . Warfarin - Pharmacist Dosing Inpatient   Does not apply q1800   Continuous:  ZOX:[id number]:acetaminophen, albuterol, ipratropium-albuterol, oxyCODONE-acetaminophen, polyethylene glycol, senna-docusate  Assessment/Plan:  Principal Problem:   Toxic metabolic encephalopathy Active Problems:   Chronic combined systolic and diastolic CHF, NYHA class 2 (EF 30-35%) per ECHO 2017   Hypertension   Chronic respiratory failure with hypoxia (HCC)   Hypoglycemia   Altered mental status   Uncontrolled type 2 diabetes mellitus with complication (HCC)   Essential hypertension   Acute deep vein thrombosis (DVT) of distal end of right lower extremity (HCC)    Acute Metabolic Encephalopathy Etiology is thought to be multifactorial including hypoglycemia, opioid overdose (patient's medication administered by SNF). Patient is now back to baseline.  Diabetes type 2 uncontrolled with complications/Hypoglycemia Patient is on land acting insulin at her skilled nursing facility. She did have evidence for hypoglycemia at presentation and also in the hospital. Patient denies any recent changes to her medication dosage. HbA1c is 8.7. CBGs are now elevated. Resume Lantus at a low-dose. Will need to lower her dose of Tresiba at the time of discharge.   Questionable Sepsis Patient  was noted to have leukocytosis, however without fever. Urine culture is again growing Klebsiella. She grew the same bacteria on November 15. Based on discharge summary it appears that she was treated with Keflex. Await sensitivities again. Also, one out of 2 blood cultures is growing strep viridans. This is most likely a contaminant.  Bacteremia with strep viridans Only one set is positive for now. Await final results from both sets. Hasn't had  any fever.  Urinary tract infection with Klebsiella Continue ceftriaxone for now.  Essential hypertension  Stable. Continue home medications.  Chronic Systolic and Diastolic CHF Compensated. Continue home medications.  Pulmonary hypertension Stable  Chronic respiratory failure (Chronic Tracheostomy) Stable, trach care ordered  RLE DVT INR was subtherapeutic. DVT was recently diagnosed earlier this month. IV heparin was initiated for bridging. INR is therapeutic today. Okay to stop IV heparin. Continue warfarin.  DVT Prophylaxis: On warfarin    Code Status: Full code  Family Communication: Discussed with the patient  Disposition Plan: Await final culture reports. Anticipate she will be able to return to her SNF in 1-2 days.    LOS: 1 day   [facility]  [facility] Pager [phone] 09/21/2016, 1:56 PM  If 7PM-7AM, please contact night-coverage at [email or web address], password [facility]

## 2016-09-21 NOTE — Care Management Note (Signed)
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 Recent admit 11/10-11/17/2017, acute encephalopathy. From [facility].  Tyson DenseCassandra Babington (Daughter)     [phone]      PCP: Laurann Montanaynthia White  Action/Plan: Return to SNF when medically stable. CSW managing disposition.  Expected Discharge Date:                  Expected Discharge Plan:  Skilled Nursing Facility  In-House Referral:  Clinical Social Work  Discharge planning Services  CM Consult  Post Acute Care Choice:    Choice offered to:     DME Arranged:    DME Agency:     HH Arranged:    HH Agency:     Status of Service:  Completed, signed off  If discussed at [employer] Meetings, dates discussed:    Additional Comments:  Epifanio LeschesCole, Alem Fahl Hudson, RN 09/21/2016, 3:42 PM

## 2016-09-21 NOTE — Progress Notes (Signed)
ANTICOAGULATION CONSULT NOTE - Follow Up Consult  Pharmacy Consult for warfarin Indication: h/o DVT  Allergies  Allergen Reactions  . Aldactone [Spironolactone] Other (See Comments)    Severe hyperkalemia   . Lisinopril Other (See Comments) and Cough    Hypotension also  . Crestor [Rosuvastatin Calcium] Other (See Comments)    Muscle Pain  . Vicodin [Hydrocodone-Acetaminophen] Nausea And Vomiting    Patient Measurements: Height: 5\' 2"  (157.5 cm) Weight: 162 lb 14.4 oz (73.9 kg) IBW/kg (Calculated) : 50.1 Heparin Dosing Weight: 65.7kg  Vital Signs: Temp: 99.3 F (37.4 C) (11/29 0646) Temp Source: Oral (11/29 0646) BP: 151/56 (11/29 0646) Pulse Rate: 87 (11/29 0646)  Labs:  Recent Labs  09/19/16 2320 09/20/16 0305 09/20/16 1815 09/21/16 0559  HGB 9.8*  --   --  9.2*  HCT 30.8*  --   --  29.4*  PLT 429*  --   --  392  LABPROT  --  18.5*  --  23.4*  INR  --  1.52  --  2.05  HEPARINUNFRC  --   --  <0.10* 0.48  CREATININE 1.91*  --   --   --     Estimated Creatinine Clearance: 25.8 mL/min (by C-G formula based on SCr of 1.91 mg/dL (H)).   Assessment: [age] YOF presenting with AMS. Patient on warfarin PTA but heparin added due to subtherapeutic INR on admission, Heparin now D/C. Pharmacy consulted for warfarin dosing. Slight drop in Hgb, Pltc wnl. Significant increase in INR, patient has normal diet ordered and was able to eat breakfast this morning which should help to stabilize INR. No bleeding noted.  11/28 HL 0.48, INR 1.52 > 2.05 (now therapeutic)  Goal of Therapy:  INR 2-3  Monitor platelets by anticoagulation protocol: Yes   Plan:  Warfarin 2.5 mg x 1 Daily INR, CBC Monitor s/sx of bleeding, PO intake, DDI, clinical progress   Virgel GessStephen Hoang Pettingill  PharmD candidate 09/21/2016,9:09 AM

## 2016-09-21 NOTE — Progress Notes (Signed)
Speech Language Pathology Treatment: Dysphagia  Patient Details Name: [name] MRN: [id number] DOB: 10/23/1946 Today's Date: 09/21/2016 Time: 1330-1400 SLP Time Calculation (min) (ACUTE ONLY): 30 min  Assessment / Plan / Recommendation Clinical Impression  Skilled treatment session focused on dysphagia goals. SLP facilitated session by providing skilled observation of regular lunch tray with thin liquids. Pt was independent with donning PMV. Pt able to recall compensatory swallow strategies. Pt consumed without any overt s/s of aspiration. Current diet appears appropriate at this time. Recommend ST to observe another meal and then pt maybe appropriate for discharge from services.    HPI HPI: 70 year old female with chronic trach (March 2013) secondary to tracheal stenosis from remote ETT. PMH: CAD, DM, COPD, stroke, HTN, recent calf pain. Pt admitted due to AMS, toxic metalbolic encephalopathy. CXR Shallow inspiration. Cardiac enlargement with diffuse pulmonary.      SLP Plan  Continue with current plan of care     Recommendations  Diet recommendations: Regular;Thin liquid Liquids provided via: Cup Medication Administration: Whole meds with liquid Supervision: Patient able to self feed Compensations: Slow rate;Small sips/bites Postural Changes and/or Swallow Maneuvers: Seated upright 90 degrees      Patient may use Passy-Muir Speech Valve: During all waking hours (remove during sleep)         Oral Care Recommendations: Oral care BID Follow up Recommendations: None Plan: Continue with current plan of care       GO               [name], M.S., [name] Speech-Language Pathologist [phone] Eesa Justiss 09/21/2016, 2:09 PM

## 2016-09-21 NOTE — Progress Notes (Signed)
RT requested PT be placed on bedside pulse ox- per policy.

## 2016-09-21 NOTE — Progress Notes (Signed)
   09/21/16 1425  Clinical Encounter Type  Visited With Patient  Visit Type Other (Comment) ([place]ner consult)  Referral From Nurse  Consult/Referral To Chaplain  Spiritual Encounters  Spiritual Needs Prayer  Stress Factors  Patient Stress Factors Health changes  Introduction to Pt. Offered prayer of health and healing.

## 2016-09-21 NOTE — Consult Note (Signed)
   [facility]   09/21/2016  [name] 10/15/1946 [id number]   Patient has been screened for re-admission.  Medicare ACO Registry.  [facility] 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 CBG was noted to be 28. There was also some concern for narcotic overdose. She had good response to Narcan. Patient was hospitalized for further management History and Physical note. Marland Kitchen. Spoke with inpatient RNCM, Marylene Landngela and the current plan is for her to return for Short term rehab there.  The patient was also active with home palliative care with [facility] program. [facility] needs at this time assessed.  For questions, please contact:  Charlesetta ShanksVictoria Orella Cushman, RN BSN CCM [facility] Liaison  [phone] business mobile phone Toll free office [phone]

## 2016-09-22 ENCOUNTER — Inpatient Hospital Stay (HOSPITAL_COMMUNITY): Payer: Medicare Other

## 2016-09-22 DIAGNOSIS — M79671 Pain in right foot: Secondary | ICD-10-CM

## 2016-09-22 DIAGNOSIS — I10 Essential (primary) hypertension: Secondary | ICD-10-CM

## 2016-09-22 DIAGNOSIS — R4182 Altered mental status, unspecified: Secondary | ICD-10-CM

## 2016-09-22 DIAGNOSIS — M79609 Pain in unspecified limb: Secondary | ICD-10-CM

## 2016-09-22 DIAGNOSIS — I739 Peripheral vascular disease, unspecified: Secondary | ICD-10-CM

## 2016-09-22 DIAGNOSIS — I70221 Atherosclerosis of native arteries of extremities with rest pain, right leg: Secondary | ICD-10-CM

## 2016-09-22 LAB — BASIC METABOLIC PANEL
ANION GAP: 9 (ref 5–15)
BUN: 25 mg/dL — ABNORMAL HIGH (ref 6–20)
CALCIUM: 8.5 mg/dL — AB (ref 8.9–10.3)
CHLORIDE: 102 mmol/L (ref 101–111)
CO2: 28 mmol/L (ref 22–32)
Creatinine, Ser: 1.33 mg/dL — ABNORMAL HIGH (ref 0.44–1.00)
GFR calc non Af Amer: 39 mL/min — ABNORMAL LOW (ref >60)
GFR, EST AFRICAN AMERICAN: 46 mL/min — AB (ref >60)
Glucose, Bld: 165 mg/dL — ABNORMAL HIGH (ref 65–99)
Potassium: 4.3 mmol/L (ref 3.5–5.1)
Sodium: 139 mmol/L (ref 135–145)

## 2016-09-22 LAB — CULTURE, BLOOD (ROUTINE X 2)

## 2016-09-22 LAB — CBC
HCT: 29.2 % — ABNORMAL LOW (ref 36.0–46.0)
HEMOGLOBIN: 9.1 g/dL — AB (ref 12.0–15.0)
MCH: 29.1 pg (ref 26.0–34.0)
MCHC: 31.2 g/dL (ref 30.0–36.0)
MCV: 93.3 fL (ref 78.0–100.0)
Platelets: 376 10*3/uL (ref 150–400)
RBC: 3.13 MIL/uL — ABNORMAL LOW (ref 3.87–5.11)
RDW: 16.8 % — ABNORMAL HIGH (ref 11.5–15.5)
WBC: 10.4 10*3/uL (ref 4.0–10.5)

## 2016-09-22 LAB — URIC ACID: URIC ACID, SERUM: 8.6 mg/dL — AB (ref 2.3–6.6)

## 2016-09-22 LAB — URINE CULTURE

## 2016-09-22 LAB — GLUCOSE, CAPILLARY
GLUCOSE-CAPILLARY: 173 mg/dL — AB (ref 65–99)
GLUCOSE-CAPILLARY: 152 mg/dL — AB (ref 65–99)
GLUCOSE-CAPILLARY: 109 mg/dL — AB (ref 65–99)
Glucose-Capillary: 154 mg/dL — ABNORMAL HIGH (ref 65–99)
Glucose-Capillary: 183 mg/dL — ABNORMAL HIGH (ref 65–99)

## 2016-09-22 LAB — HEPARIN LEVEL (UNFRACTIONATED): Heparin Unfractionated: <0.1 IU/mL — ABNORMAL LOW (ref 0.30–0.70)

## 2016-09-22 LAB — PROTIME-INR
INR: 2.1
PROTHROMBIN TIME: 23.9 s — AB (ref 11.4–15.2)

## 2016-09-22 MED ORDER — CEFPODOXIME PROXETIL 200 MG PO TABS
200.0000 mg | ORAL_TABLET | Freq: Two times a day (BID) | ORAL | Status: AC
  Administered 2016-09-22 – 2016-09-28 (×13): 200 mg via ORAL
  Filled 2016-09-22 (×14): qty 1

## 2016-09-22 MED ORDER — INSULIN DEGLUDEC 100 UNIT/ML ~~LOC~~ SOPN: 6.0000 [IU] | PEN_INJECTOR | Freq: Every day | SUBCUTANEOUS | Status: AC

## 2016-09-22 MED ORDER — OXYCODONE-ACETAMINOPHEN 5-325 MG PO TABS
1.0000 | ORAL_TABLET | Freq: Four times a day (QID) | ORAL | Status: DC | PRN
  Administered 2016-09-22 – 2016-09-24 (×7): 1 via ORAL
  Administered 2016-09-25 (×2): 2 via ORAL
  Administered 2016-09-26: 1 via ORAL
  Administered 2016-09-26: 2 via ORAL
  Administered 2016-09-27: 1 via ORAL
  Administered 2016-09-27 – 2016-09-28 (×4): 2 via ORAL
  Administered 2016-09-29 – 2016-09-30 (×5): 1 via ORAL
  Administered 2016-10-01: 2 via ORAL
  Administered 2016-10-01: 1 via ORAL
  Administered 2016-10-02 (×2): 2 via ORAL
  Filled 2016-09-22: qty 1
  Filled 2016-09-22 (×3): qty 2
  Filled 2016-09-22 (×2): qty 1
  Filled 2016-09-22: qty 2
  Filled 2016-09-22: qty 1
  Filled 2016-09-22: qty 2
  Filled 2016-09-22: qty 1
  Filled 2016-09-22: qty 2
  Filled 2016-09-22 (×3): qty 1
  Filled 2016-09-22: qty 2
  Filled 2016-09-22: qty 1
  Filled 2016-09-22: qty 2
  Filled 2016-09-22 (×2): qty 1
  Filled 2016-09-22: qty 2
  Filled 2016-09-22 (×3): qty 1
  Filled 2016-09-22: qty 2
  Filled 2016-09-22: qty 1

## 2016-09-22 MED ORDER — OXYCODONE-ACETAMINOPHEN 5-325 MG PO TABS: 1.0000 | ORAL_TABLET | Freq: Four times a day (QID) | ORAL | 0 refills | Status: DC | PRN

## 2016-09-22 MED ORDER — WARFARIN SODIUM 5 MG PO TABS
5.0000 mg | ORAL_TABLET | Freq: Once | ORAL | Status: AC
  Administered 2016-09-22: 5 mg via ORAL
  Filled 2016-09-22: qty 1

## 2016-09-22 MED ORDER — CEFPODOXIME PROXETIL 200 MG PO TABS: 200.0000 mg | ORAL_TABLET | Freq: Two times a day (BID) | ORAL | Status: DC

## 2016-09-22 NOTE — Evaluation (Signed)
Physical Therapy Evaluation Patient Details Name: [name] MRN: [id number] DOB: 08/20/1946 Today's Date: 09/22/2016   History of Present Illness  Pt is a 70 y/o female admitted secondary to AMS, toxic metabolic encephalopathy. PMH including but not limited to chronic trach dependent, CAD, CKD, COPD, PAD, CVA (2013), DM and hx of CABG.   Clinical Impression  Pt presented supine in bed with HOB elevated, awake and willing to participate in therapy session. Prior to admission, pt reported that she ambulated short distances with use of RW and was independent with dressing and bathing. Pt only willing to sit EOB secondary to increased pain in R foot. X-ray performed this AM showing bone spurs, no fxs. Pt would continue to benefit from skilled physical therapy services at this time while admitted and after d/c to address her below listed limitations in order to improve her overall safety and independence with functional mobility.     Follow Up Recommendations SNF    Equipment Recommendations  None recommended by PT    Recommendations for Other Services       Precautions / Restrictions Precautions Precautions: Fall Precaution Comments: Chronic Trach Restrictions Weight Bearing Restrictions: No      Mobility  Bed Mobility Overal bed mobility: Needs Assistance Bed Mobility: Supine to Sit;Sit to Supine     Supine to sit: Min guard;HOB elevated Sit to supine: Min assist   General bed mobility comments: pt required increased time, use of bed rails, min A with bilateral LEs to return to supine  Transfers                 General transfer comment: pt refusing to attempt standing secondary to pain in R foot  Ambulation/Gait                Stairs            Wheelchair Mobility    Modified Rankin (Stroke Patients Only)       Balance Overall balance assessment: Needs assistance Sitting-balance support: Feet unsupported;No upper extremity  supported Sitting balance-Leahy Scale: Fair         Standing balance comment: pt refusing to attempt standing secondary to pain in R foot                             Pertinent Vitals/Pain Pain Assessment: Faces Faces Pain Scale: Hurts little more Pain Location: R foot Pain Descriptors / Indicators: Sore;Grimacing;Guarding Pain Intervention(s): Monitored during session;Repositioned;Limited activity within patient's tolerance    Home Living Family/patient expects to be discharged to:: Skilled nursing facility                      Prior Function Level of Independence: Independent with assistive device(s)      ADL's / Homemaking Assistance Needed: pt ambulated with use of RW        Hand Dominance   Dominant Hand: Right    Extremity/Trunk Assessment   Upper Extremity Assessment: Defer to OT evaluation           Lower Extremity Assessment: Generalized weakness;RLE deficits/detail;LLE deficits/detail RLE Deficits / Details: Pt with reports of pain in R foot and ankle with palpation and AROM. MMT revealed 3+/5 for hip flexion, hip abduction, hip adduction, knee flexion and knee extension.  LLE Deficits / Details: MMT revealed 3+/5 for hip flexion, hip abduction, hip adduction, knee flexion, knee extension and ankle DF.     Communication  Communication: Tracheostomy;Passy-Muir valve  Cognition Arousal/Alertness: Awake/alert Behavior During Therapy: WFL for tasks assessed/performed Overall Cognitive Status: Within Functional Limits for tasks assessed                      General Comments      Exercises     Assessment/Plan    PT Assessment Patient needs continued PT services  PT Problem List Decreased strength;Decreased range of motion;Decreased activity tolerance;Decreased balance;Decreased mobility;Decreased coordination;Decreased knowledge of use of DME;Decreased safety awareness;Cardiopulmonary status limiting activity           PT Treatment Interventions DME instruction;Gait training;Stair training;Functional mobility training;Therapeutic activities;Therapeutic exercise;Balance training;Neuromuscular re-education;Patient/family education    PT Goals (Current goals can be found in the Care Plan section)  Acute Rehab PT Goals Patient Stated Goal: return to SNF PT Goal Formulation: With patient Time For Goal Achievement: 10/06/16 Potential to Achieve Goals: Fair    Frequency Min 3X/week   Barriers to discharge        Co-evaluation               End of Session Equipment Utilized During Treatment: Oxygen (35% O2 on trach collar) Activity Tolerance: Patient limited by pain Patient left: in bed;with call bell/phone within reach;with bed alarm set Nurse Communication: Mobility status         Time: [id number] PT Time Calculation (min) (ACUTE ONLY): 13 min   Charges:   PT Evaluation $PT Eval Moderate Complexity: 1 Procedure     PT G CodesAlessandra Bevels:        Chianne Byrns M Monta Maiorana 09/22/2016, 2:25 PM Deborah ChalkJennifer Jarick Harkins, PT, DPT [phone]

## 2016-09-22 NOTE — Progress Notes (Signed)
OT Cancellation Note  Patient Details Name: Scot JunDorothy I Brewbaker MRN: [id number] DOB: 01/15/1946   Cancelled Treatment:    Reason Eval/Treat Not Completed: Other (comment) (pt c/o foot pain, plan for x-ray this AM). Will follow up as time allows.  Gaye AlkenBailey A Shere Eisenhart M.S., OTR/L Pager: [phone]  09/22/2016, 9:57 AM

## 2016-09-22 NOTE — Progress Notes (Addendum)
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 CBG was noted to be 28. There was also some concern for narcotic overdose. She had good response to Narcan. Patient was hospitalized for further management.  Reason for Visit: Altered mental status  Consultants: None  Procedures: None  Antibiotics: None  Subjective/Interval History: Patient complaining of pain in the right foot. Has pains been ongoing for 3 weeks. Denies any trauma. Denies any shortness of breath. Denies any nausea, vomiting. No chest pain.  ROS: Denies headaches. No dizziness.  Objective:  Vital Signs  Vitals:   09/22/16 0558 09/22/16 0825 09/22/16 1408 09/22/16 1412  BP:   (!) 124/45 (!) 124/45  Pulse:    79  Resp:      Temp:      TempSrc:      SpO2:  94%    Weight: 75.8 kg (167 lb 3.2 oz)     Height:        Intake/Output Summary (Last 24 hours) at 09/22/16 1504 Last data filed at 09/21/16 2033  Gross per 24 hour  Intake                0 ml  Output              200 ml  Net             -200 ml   Filed Weights   09/19/16 2305 09/21/16 0643 09/22/16 0558  Weight: 73 kg (161 lb) 73.9 kg (162 lb 14.4 oz) 75.8 kg (167 lb 3.2 oz)    General appearance: alert, cooperative, appears stated age and no distress Tracheostomy noted Resp: clear to auscultation bilaterally Cardio: regular rate and rhythm, S1, S2 normal, no murmur, click, rub or gallop GI: soft, non-tender; bowel sounds normal; no masses,  no organomegaly Extremities: Right foot is slightly colder compared  to the left. Difficult to appreciate the dorsalis pedis pulses. No signs of ischemia. Neurologic: Awake and alert. Oriented 3.  Lab Results:  Data Reviewed: I have personally reviewed following labs and imaging studies  CBC:  Recent Labs Lab 09/19/16 2320 09/21/16 0559 09/22/16 0412  WBC 15.5* 12.6* 10.4  NEUTROABS 13.5*  --   --   HGB 9.8* 9.2* 9.1*  HCT 30.8* 29.4* 29.2*  MCV 90.6 91.9 93.3  PLT 429* 392 376    Basic Metabolic Panel:  Recent Labs Lab 09/19/16 2320 09/20/16 0050 09/21/16 0957 09/22/16 0412  NA 133*  --  140 139  K 6.1* 4.0 3.9 4.3  CL 98*  --  104 102  CO2 26  --  27 28  GLUCOSE 84  --  167* 165*  BUN 39*  --  23* 25*  CREATININE 1.91*  --  1.33* 1.33*  CALCIUM 8.7*  --  8.1* 8.5*    GFR: Estimated Creatinine Clearance: 37.5 mL/min (by C-G formula based on SCr of 1.33 mg/dL (H)).  Liver Function Tests:  Recent Labs Lab 09/19/16 2320 09/21/16 0957  AST 67* 18  ALT 24 15  ALKPHOS 66 62  BILITOT 1.9* 0.5  PROT 6.9 6.0*  ALBUMIN 2.9* 2.4*  Coagulation Profile:  Recent Labs Lab 09/20/16 0305 09/21/16 0559 09/22/16 0412  INR 1.52 2.05 2.10    CBG:  Recent Labs Lab 09/21/16 1145 09/21/16 1651 09/21/16 2035 09/22/16 0818 09/22/16 1308  GLUCAP 215* 196* 197* 183* 173*     Recent Results (from the past 240 hour(s))  Urine culture     Status: Abnormal   Collection Time: 09/19/16 11:21 PM  Result Value Ref Range Status   Specimen Description URINE, CATHETERIZED  Final   Special Requests NONE  Final   Culture >=100,000 COLONIES/mL KLEBSIELLA PNEUMONIAE (A)  Final   Report Status 09/22/2016 FINAL  Final   Organism ID, Bacteria KLEBSIELLA PNEUMONIAE (A)  Final      Susceptibility   Klebsiella pneumoniae - MIC*    AMPICILLIN >=32 RESISTANT Resistant     CEFAZOLIN <=4 SENSITIVE Sensitive     CEFTRIAXONE <=1 SENSITIVE Sensitive     CIPROFLOXACIN <=0.25 SENSITIVE Sensitive     GENTAMICIN <=1 SENSITIVE Sensitive      IMIPENEM <=0.25 SENSITIVE Sensitive     NITROFURANTOIN 64 INTERMEDIATE Intermediate     TRIMETH/SULFA <=20 SENSITIVE Sensitive     AMPICILLIN/SULBACTAM 4 SENSITIVE Sensitive     PIP/TAZO <=4 SENSITIVE Sensitive     Extended ESBL NEGATIVE Sensitive     * >=100,000 COLONIES/mL KLEBSIELLA PNEUMONIAE  Blood culture (routine x 2)     Status: Abnormal   Collection Time: 09/19/16 11:44 PM  Result Value Ref Range Status   Specimen Description BLOOD RIGHT ARM  Final   Special Requests BOTTLES DRAWN AEROBIC AND ANAEROBIC  Final   Culture  Setup Time   Final    GRAM POSITIVE COCCI IN PAIRS IN CHAINS ANAEROBIC BOTTLE ONLY Organism ID to follow CRITICAL RESULT CALLED TO, READ BACK BY AND VERIFIED WITH: GREG ABBOTT,PHARMD @0436  09/21/16 MKELLY,MLT    Culture (A)  Final    VIRIDANS STREPTOCOCCUS THE SIGNIFICANCE OF ISOLATING THIS ORGANISM FROM A SINGLE SET OF BLOOD CULTURES WHEN MULTIPLE SETS ARE DRAWN IS UNCERTAIN. PLEASE NOTIFY THE MICROBIOLOGY DEPARTMENT WITHIN ONE WEEK IF SPECIATION AND SENSITIVITIES ARE REQUIRED.    Report Status 09/22/2016 FINAL  Final  Blood Culture ID Panel (Reflexed)     Status: Abnormal   Collection Time: 09/19/16 11:44 PM  Result Value Ref Range Status   Enterococcus species NOT DETECTED NOT DETECTED Final   Listeria monocytogenes NOT DETECTED NOT DETECTED Final   Staphylococcus species NOT DETECTED NOT DETECTED Final   Staphylococcus aureus NOT DETECTED NOT DETECTED Final   Streptococcus species DETECTED (A) NOT DETECTED Final    Comment: CRITICAL RESULT CALLED TO, READ BACK BY AND VERIFIED WITH: GREG ABBOTT, PHARMD @0436  09/21/16 MKELLY,MLT    Streptococcus agalactiae NOT DETECTED NOT DETECTED Final   Streptococcus pneumoniae NOT DETECTED NOT DETECTED Final   Streptococcus pyogenes NOT DETECTED NOT DETECTED Final   Acinetobacter baumannii NOT DETECTED NOT DETECTED Final   Enterobacteriaceae species NOT DETECTED NOT DETECTED Final   Enterobacter cloacae  complex NOT DETECTED NOT DETECTED Final   Escherichia coli NOT DETECTED NOT DETECTED Final   Klebsiella oxytoca NOT DETECTED NOT DETECTED Final   Klebsiella pneumoniae NOT DETECTED NOT DETECTED Final   Proteus species NOT DETECTED NOT DETECTED Final   Serratia marcescens NOT DETECTED NOT DETECTED Final   Haemophilus influenzae NOT DETECTED NOT DETECTED Final   Neisseria meningitidis NOT DETECTED NOT DETECTED Final   Pseudomonas aeruginosa NOT DETECTED NOT DETECTED Final   Candida albicans NOT DETECTED NOT DETECTED Final   Candida  glabrata NOT DETECTED NOT DETECTED Final   Candida krusei NOT DETECTED NOT DETECTED Final   Candida parapsilosis NOT DETECTED NOT DETECTED Final   Candida tropicalis NOT DETECTED NOT DETECTED Final  Blood culture (routine x 2)     Status: None (Preliminary result)   Collection Time: 09/20/16 12:35 AM  Result Value Ref Range Status   Specimen Description BLOOD LEFT ARM  Final   Special Requests BOTTLES DRAWN AEROBIC AND ANAEROBIC  Final   Culture NO GROWTH 1 DAY  Final   Report Status PENDING  Incomplete  MRSA PCR Screening     Status: None   Collection Time: 09/20/16  6:14 PM  Result Value Ref Range Status   MRSA by PCR NEGATIVE NEGATIVE Final    Comment:        The GeneXpert MRSA Assay (FDA approved for NASAL specimens only), is one component of a comprehensive MRSA colonization surveillance program. It is not intended to diagnose MRSA infection nor to guide or monitor treatment for MRSA infections.       Radiology Studies: Dg Foot 2 Views Right  Result Date: 09/22/2016 CLINICAL DATA:  Pain in the right heel for 2 weeks, diabetes, no trauma EXAM: RIGHT FOOT - 2 VIEW COMPARISON:  None FINDINGS: No fracture is seen. No bony erosion is noted. Small calcaneal degenerative spurs are present. There is degenerative change of the right first MTP joint. No erosion is noted. The bones are slightly osteopenic some irregularity of the proximal phalanx of  the right fifth toe is noted and clinical correlation is recommended to exclude acute abnormality. IMPRESSION: 1. Small calcaneal degenerative spurs.  No acute abnormality. 2. Slight irregularity of the proximal phalanx of the right fifth toe. Correlate clinically. Electronically Signed   By: Dwyane Dee M.D.   On: 09/22/2016 11:08     Medications:  Scheduled: . aspirin EC  81 mg Oral Daily  . budesonide  0.25 mg Nebulization BID  . carvedilol  12.5 mg Oral BID WC  . cefpodoxime  200 mg Oral Q12H  . dicyclomine  20 mg Oral TID AC  . DULoxetine  60 mg Oral QHS  . ferrous sulfate  325 mg Oral Q breakfast  . gabapentin  300 mg Oral QHS  . hydrALAZINE  25 mg Oral Q8H  . insulin aspart  0-15 Units Subcutaneous Q4H  . insulin glargine  5 Units Subcutaneous Daily  . isosorbide mononitrate  15 mg Oral Daily  . levothyroxine  75 mcg Oral QAC breakfast  . multivitamin with minerals  1 tablet Oral Daily  . pantoprazole  40 mg Oral Daily  . simvastatin  40 mg Oral QPM  . tiZANidine  2 mg Oral Daily  . torsemide  40 mg Oral Daily  . warfarin  5 mg Oral ONCE-1800  . Warfarin - Pharmacist Dosing Inpatient   Does not apply q1800   Continuous:  ZOX:[id number], albuterol, ipratropium-albuterol, oxyCODONE-acetaminophen, polyethylene glycol, senna-docusate  Assessment/Plan:  Principal Problem:   Toxic metabolic encephalopathy Active Problems:   Chronic combined systolic and diastolic CHF, NYHA class 2 (EF 30-35%) per ECHO 2017   Hypertension   Chronic respiratory failure with hypoxia (HCC)   Hypoglycemia   Altered mental status   Uncontrolled type 2 diabetes mellitus with complication (HCC)   Essential hypertension   Acute deep vein thrombosis (DVT) of distal end of right lower extremity (HCC)    Right foot pain Etiology unclear. Initially thought to be secondary to DVT. However, pain is  more in the foot area. No obvious lesions identified. Patient noted have a history of peripheral  vascular disease and has had bypass to her right leg. She also tells me that she has stents in her right leg. In view of this, proceed with arterial Doppler study.  Acute Metabolic Encephalopathy Etiology is thought to be multifactorial including hypoglycemia, opioid overdose (patient's medication administered by SNF). Patient is now back to baseline.  Diabetes type 2 uncontrolled with complications/Hypoglycemia Patient is on land acting insulin at her skilled nursing facility. She did have evidence for hypoglycemia at presentation and also in the hospital. Patient denies any recent changes to her medication dosage. HbA1c is 8.7. CBGs are now elevated. On Lantus here at a lower dose. Will need to lower her dose of Tresiba at the time of discharge.   Questionable Sepsis Patient was noted to have leukocytosis, however without fever. Urine culture is again growing Klebsiella. She grew the same bacteria on November 15. Based on discharge summary it appears that she was treated with Keflex. Sensitivities are available. [facility] for now.  Also, one out of 2 blood cultures is growing strep viridans. This is most likely a contaminant.  Bacteremia with strep viridans Only one set is positive for now. Other set remains negative. This is most likely a contaminant. Patient has not had any fevers.   Urinary tract infection with Klebsiella Change to Vantin.  Essential hypertension  Stable. Continue home medications.  Chronic Systolic and Diastolic CHF Compensated. Continue home medications.  Pulmonary hypertension Stable  Chronic respiratory failure (Chronic Tracheostomy) Stable, trach care ordered  RLE DVT Continue warfarin. Did require bridging with heparin due to subtherapeutic INR. Earlier during this hospitalization.  ADDENDUM: Abnormal vascular study noted. Pain could be ischemic. Discussed with Dr. Imogene Burnhen who will consult.  DVT Prophylaxis: On warfarin    Code Status: Full code   Family Communication: Discussed with the patient  Disposition Plan: Await arterial Doppler study. If negative, can return to skilled nursing facility later today.    LOS: 2 days   [facility] HospitalKRISHNAN,Sona Nations  [facility] Pager [phone] 09/22/2016, 3:04 PM  If 7PM-7AM, please contact night-coverage at [email or web address], password [email or web address]

## 2016-09-22 NOTE — Progress Notes (Addendum)
VASCULAR LAB PRELIMINARY  ARTERIAL  ABI completed:    RIGHT    LEFT    PRESSURE WAVEFORM  PRESSURE WAVEFORM  BRACHIAL 136 Triphasic BRACHIAL 148 Triphasic     DP 107 Monophasic  AT 31 Barely audible     PT  Barely audible PT 111 Monophasic  PER  Barely audible       RIGHT LEFT  ABI 0.21 in AT 0.75   Right ABIs indicate a severe reduction in arterial flow at rest. Unable to obtain all pressures due to venous signals overriding the arterial signals. Left ABI indicates a moderate reduction in arterial flow at rest.  [name], RVS 09/22/2016, 4:03 PM

## 2016-09-22 NOTE — Progress Notes (Signed)
Speech Language Pathology Treatment: Dysphagia  Patient Details Name: [name] MRN: [id number] DOB: April 03, 1946 Today's Date: 09/22/2016 Time: 6270-3500 SLP Time Calculation (min) (ACUTE ONLY): 11 min  Assessment / Plan / Recommendation Clinical Impression  Pt is independent with PMSV and safe swallowing precautions. Demonstrates awareness of appropriate posture, use of PMSV for eating, removal for sleep. No difficulty eating ot drinking whatsoever. Pt may continue diet and PMSV use independently. No SLP f/u needed will sign off.    HPI HPI: 70 year old female with chronic trach (March 2013) secondary to tracheal stenosis from remote ETT. PMH: CAD, DM, COPD, stroke, HTN, recent calf pain. Pt admitted due to AMS, toxic metalbolic encephalopathy. CXR Shallow inspiration. Cardiac enlargement with diffuse pulmonary.      SLP Plan  All goals met     Recommendations  Diet recommendations: Regular;Thin liquid Liquids provided via: Cup Medication Administration: Whole meds with liquid Supervision: Patient able to self feed Compensations: Slow rate;Small sips/bites Postural Changes and/or Swallow Maneuvers: Seated upright 90 degrees      Patient may use Passy-Muir Speech Valve: During all waking hours (remove during sleep) PMSV Supervision: Intermittent         Plan: All goals met       GO               Herbie Baltimore, MA CCC-SLP [phone]  Lynann Beaver 09/22/2016, 9:10 AM

## 2016-09-22 NOTE — Consult Note (Signed)
Referred by: Dr. Osvaldo Shipper  Reason for referral: right foot ischemia  History of Present Illness  [name] is a 70 y.o. (18-May-1946) female who presents with chief complaint: right foot pain.    Pt's prior procedures include:  1. L EIA to PT bypass w/ NS ips GSV (06/11/13) by Dr Darrick Penna 2. Ao, BRo, R CIA PTA+S (04/05/13)  Outside procedures include:  1. R CFA to AK pop BPG 2. R AK pop to PT bypass with vein  Onset of symptoms occurred 2 weeks ago.  Pain is described as sharp pain in right foot with tenderness to touch, severity 5-10/10, and associated with rest and worsening with ambulation.   This pain was felt initially to be related to a calf vein DVT diagnosed ~2 weeks ago.  Patient has attempted to treat this pain with rest and narcotics.  The patient has rest pain symptoms also and no leg wounds/ulcers.  Atherosclerotic risk factors include: HTN, HLD, DM, and prior active smoking.   Pt denies any numbness in the right foot.  She has been walking on that foot with some difficulty.  This patient was admitted 2 days ago with AMS of multifactorial etiologies.  BLE ABI was completed this afternoon, which lead to the consultation for right foot ischemia.  Past Medical History:  Diagnosis Date  . Anemia   . Arthritis    "hands, back" (06/28/2016)  . BRBPR (bright red blood per rectum) 06/28/2016  . CAD (coronary artery disease)    a. S/p CABG and stenting // b. Myoview 3/16: high risk EF 25%, ant-apical AK, ant scar with peri-infarct ischemia // c. LHC 3/16 - EF 45%, inferior hypokinesis, oLAD 100, oLCx 40, branch vessel 50, pRCA 100 dRCA , RV br 90, S-RCA occluded, S-LAD patent with inf-apical LAD beyond graft 70 >> med Rx  . Carotid artery disease (HCC)    a. s/p L CEA // b. Carotid US 4/16: R 40-59%; L CEA patent with 40-59%  . Chronic combined systolic and diastolic CHF (congestive heart failure) (HCC)    a. 11/2011 Echo with EF 30-35%, global hypokinesis, and inferior  akinesis;  b. 12/2014 Echo: EF 50%, mod LVH, Ao sclerosis w/o stenosis, mildly dil LA, mild to mod RV dysfxn. // c. Echo 10/16: Moderate LVH, EF 35-40%, inferior AK, grade 1 diastolic dysfunction, MAC, mild MR, moderate LAE  . Chronic lower back pain   . CKD (chronic kidney disease), stage III   . COPD (chronic obstructive pulmonary disease) (HCC)    a. prn and HS supplemental O2.  . DDD (degenerative disc disease), lumbar   . Depression   . GERD (gastroesophageal reflux disease)   . History of blood transfusion ~ 2015; 06/28/2016   "anemia"  . History of IBS   . History of tracheostomy 08/26/2013   Dr. Jenne Pane  . Hyperlipidemia   . Hypertension   . Hypothyroidism    Goiter  . MI (myocardial infarction) 1997  . On home oxygen therapy    "4L trach collar at night & prn during the day" (09/20/2016)  . PAD (peripheral artery disease) (HCC)   . Stroke [facility])    MRI 11/2011 with remote occipital lobe. MRA with moderate left focal vertebral artery stenosis  . Type II diabetes mellitus (HCC)     Past Surgical History:  Procedure Laterality Date  . ABDOMINAL AORTAGRAM N/A 04/05/2013   Procedure: ABDOMINAL Ronny Flurry;  Surgeon: Sherren Kerns, MD;  Location: [facility] CATH LAB;  Service: Cardiovascular;  Laterality: N/A;  . [id number]  [id number]   Aortogram by Dr. [place] McKenzie [facility])  . CAROTID ENDARTERECTOMY Left ~2008  . CARPAL TUNNEL RELEASE Left   . CATARACT EXTRACTION W/ INTRAOCULAR LENS IMPLANT & ANTERIOR VITRECTOMY, BILATERAL  ~2007-02/2016   "left-right"  . CHOLECYSTECTOMY OPEN    . COLONOSCOPY N/A 10/27/2014   Procedure: COLONOSCOPY;  Surgeon: Willis Modena, MD;  Location: [facility] ENDOSCOPY;  Service: Endoscopy;  Laterality: N/A;  . CORONARY ARTERY BYPASS GRAFT     2 vessel  . DILATION AND CURETTAGE OF UTERUS    . FEMORAL-TIBIAL BYPASS GRAFT Left 06/11/2013   Procedure: BYPASS GRAFT  LEFT FEMORAL- POSTERIOR TIBIAL ARTERY/ REDO;  Surgeon: Sherren Kerns, MD;  Location: [facility] OR;   Service: Vascular;  Laterality: Left;  . LEFT AND RIGHT HEART CATHETERIZATION WITH CORONARY ANGIOGRAM N/A 01/09/2015   Procedure: LEFT AND RIGHT HEART CATHETERIZATION WITH CORONARY ANGIOGRAM;  Surgeon: Marykay Lex, MD;  Location: [facility] CATH LAB;  Service: Cardiovascular;  Laterality: N/A;  . PR VEIN BYPASS GRAFT,AORTO-FEM-POP     Right common femoral-AK popliteal BPG & Right Popliteal-posterior tibial  . PR VEIN BYPASS GRAFT,AORTO-FEM-POP     Left Fem-pop BPG  . PTCA    . SPINE SURGERY  Oct. 27, 2014   Injection - Back  . THROMBECTOMY FEMORAL ARTERY Left 06/11/2013   Procedure: THROMBECTOMY FEMORAL ARTERY;  Surgeon: Sherren Kerns, MD;  Location: [facility] OR;  Service: Vascular;  Laterality: Left;  . THYROIDECTOMY    . TRACHEOSTOMY TUBE PLACEMENT  01/02/2012  . TUBAL LIGATION      Social History   Social History  . Marital status: Single    Spouse name: N/A  . Number of children: 3  . Years of education: N/A   Occupational History  . Retired    Social History Main Topics  . Smoking status: Former Smoker    Packs/day: 1.00    Years: 50.00    Types: Cigarettes    Quit date: 01/24/2012  . Smokeless tobacco: Never Used  . Alcohol use No  . Drug use: No  . Sexual activity: No   Other Topics Concern  . Not on file   Social History Narrative   Retired Advertising account executive.  Worked at the [employer] on [employer].  Formerly lived in [place] area.  2 daughters, Living here with her daughter in [place] starting end of January 2013. [name] [phone] [id number]. Ambulatory previously with a cane, but none now.    Family History  Problem Relation Age of Onset  . Hyperlipidemia Mother   . Other Mother     AAA  . Alzheimer's disease Mother   . Heart disease Mother   . Irregular heart beat Mother   . Diabetes Daughter   . Hypertension Daughter     Current Facility-Administered Medications  Medication Dose Route Frequency Provider Last Rate Last Dose  . acetaminophen (TYLENOL)  tablet 650 mg  650 mg Oral Q4H PRN Hillary Bow, DO   650 mg at 09/22/16 0013  . albuterol (PROVENTIL) (2.5 MG/3ML) 0.083% nebulizer solution 2.5 mg  2.5 mg Nebulization Q2H PRN Hillary Bow, DO      . aspirin EC tablet 81 mg  81 mg Oral Daily Hillary Bow, DO   81 mg at 09/22/16 8657  . budesonide (PULMICORT) nebulizer solution 0.25 mg  0.25 mg Nebulization BID Hillary Bow, DO   0.25 mg at 09/22/16 8469  . carvedilol (COREG)  tablet 12.5 mg  12.5 mg Oral BID WC Hillary BowJared M Gardner, DO   12.5 mg at 09/22/16 1752  . cefpodoxime (VANTIN) tablet 200 mg  200 mg Oral Q12H Osvaldo ShipperGokul Krishnan, MD   200 mg at 09/22/16 1752  . dicyclomine (BENTYL) tablet 20 mg  20 mg Oral TID AC Hillary BowJared M Gardner, DO   20 mg at 09/22/16 1752  . DULoxetine (CYMBALTA) DR capsule 60 mg  60 mg Oral QHS Hillary BowJared M Gardner, DO   60 mg at 09/21/16 2058  . ferrous sulfate tablet 325 mg  325 mg Oral Q breakfast Hillary BowJared M Gardner, DO   325 mg at 09/22/16 [id number]  . gabapentin (NEURONTIN) capsule 300 mg  300 mg Oral QHS Hillary BowJared M Gardner, DO   300 mg at 09/21/16 2058  . hydrALAZINE (APRESOLINE) tablet 25 mg  25 mg Oral Q8H Hillary BowJared M Gardner, DO   25 mg at 09/22/16 1408  . insulin aspart (novoLOG) injection 0-15 Units  0-15 Units Subcutaneous Q4H Drema Dallasurtis J Woods, MD   3 Units at 09/22/16 1753  . insulin glargine (LANTUS) injection 5 Units  5 Units Subcutaneous Daily Osvaldo ShipperGokul Krishnan, MD   5 Units at 09/22/16 0941  . ipratropium-albuterol (DUONEB) 0.5-2.5 (3) MG/3ML nebulizer solution 3 mL  3 mL Nebulization BID PRN Hillary BowJared M Gardner, DO      . isosorbide mononitrate (IMDUR) 24 hr tablet 15 mg  15 mg Oral Daily Hillary BowJared M Gardner, DO   15 mg at 09/22/16 [id number]  . levothyroxine (SYNTHROID, LEVOTHROID) tablet 75 mcg  75 mcg Oral QAC breakfast Hillary BowJared M Gardner, DO   75 mcg at 09/22/16 [id number]  . multivitamin with minerals tablet 1 tablet  1 tablet Oral Daily Hillary BowJared M Gardner, DO   1 tablet at 09/22/16 [id number]  . oxyCODONE-acetaminophen (PERCOCET/ROXICET) 5-325 MG per  tablet 1-2 tablet  1-2 tablet Oral Q6H PRN Osvaldo ShipperGokul Krishnan, MD   1 tablet at 09/22/16 1627  . pantoprazole (PROTONIX) EC tablet 40 mg  40 mg Oral Daily Hillary BowJared M Gardner, DO   40 mg at 09/22/16 [id number]  . polyethylene glycol (MIRALAX / GLYCOLAX) packet 17 g  17 g Oral Daily PRN Hillary BowJared M Gardner, DO      . senna-docusate (Senokot-S) tablet 2 tablet  2 tablet Oral QHS PRN Hillary BowJared M Gardner, DO      . simvastatin (ZOCOR) tablet 40 mg  40 mg Oral QPM Hillary BowJared M Gardner, DO   40 mg at 09/22/16 1753  . tiZANidine (ZANAFLEX) tablet 2 mg  2 mg Oral Daily Hillary BowJared M Gardner, DO   2 mg at 09/22/16 [id number]  . torsemide (DEMADEX) tablet 40 mg  40 mg Oral Daily Hillary BowJared M Gardner, DO   40 mg at 09/22/16 [id number]  . Warfarin - Pharmacist Dosing Inpatient   Does not apply [id number] Shon Batonourtney F Horton, MD        Allergies  Allergen Reactions  . Aldactone [Spironolactone] Other (See Comments)    Severe hyperkalemia   . Lisinopril Other (See Comments) and Cough    Hypotension also  . Crestor [Rosuvastatin Calcium] Other (See Comments)    Muscle Pain  . Vicodin [Hydrocodone-Acetaminophen] Nausea And Vomiting     REVIEW OF SYSTEMS:   Cardiac:  positive for: no symptoms, negative for: Chest pain or chest pressure, Shortness of breath upon exertion and Shortness of breath when lying flat,   Vascular:  positive for: DVT, rest pain,  negative for: Pain in calf, thigh, or hip brought on  by ambulation and Leg swelling  Pulmonary:  positive for: chronic trach,  negative for: Oxygen at home, Productive cough and Wheezing  Neurologic:  positive for: No symptoms, negative for: Sudden weakness in arms or legs, Sudden numbness in arms or legs, Sudden onset of difficulty speaking or slurred speech, Temporary loss of vision in one eye and Problems with dizziness  Gastrointestinal:  positive for: no symptoms, negative for: Blood in stool and Vomited blood  Genitourinary:  positive for: no symptoms, negative for: Burning when  urinating and Blood in urine  Psychiatric:  positive for: no symptoms,  negative for: Major depression  Hematologic:  positive for: no symptoms,  negative for: negative for: Bleeding problems and Problems with blood clotting too easily  Dermatologic:  positive for: no symptoms, negative for: Rashes or ulcers  Constitutional:  positive for: no symptoms, negative for: Fever or chills  Ear/Nose/Throat:  positive for: history of tracheal stenosis, negative for: Change in hearing, Nose bleeds and Sore throat  Musculoskeletal:  positive for: no symptoms, negative for: Back pain, Joint pain and Muscle pain   For VQI Use Only  PRE-ADM LIVING: Nursing home  AMB STATUS: Ambulatory  CAD Sx: None  PRIOR CHF: None  STRESS TEST: No   Physical Examination  Vitals:   09/22/16 1408 09/22/16 1412 09/22/16 1607 09/22/16 1659  BP: (!) 124/45 (!) 124/45  (!) 167/63  Pulse:  79 83 82  Resp:   17 18  Temp:      TempSrc:      SpO2:   99%   Weight:      Height:        Body mass index is 30.58 kg/m.  General: Alert, O x 3, WD,Ill appearing  Head: [place]/AT, moon facies  Ear/Nose/Throat: Hearing grossly intact, nares without erythema or drainage, oropharynx without Erythema or Exudate , Mallampati score: 3,   Eyes: PERRLA, EOMI,   Neck: Supple, mid-line trachea, tracheostomy with Passy muir valve  Pulmonary: Sym exp, distal BS,CTA B  Cardiac: RRR, Nl S1, S2, no Murmurs, No rubs, No S3,S4  Vascular: Vessel Right Left  Radial Palpable Palpable  Brachial Palpable Palpable  Carotid Palpable, No Bruit Palpable, No Bruit  Aorta Not palpable N/A  Femoral Palpable Palpable  Popliteal Not palpable Not palpable  PT Not palpable Palpable  DP Not palpable Faintly palpable   Gastrointestinal: soft, non-distended, non-tender to palpation, No guarding or rebound, no HSM, no masses, no CVAT B, No palpable prominent aortic pulse,    Musculoskeletal: M/S 5/5 throughout  ,  Extremities without ischemic changes except R foot with dependent rubor with ischemic R 5th toe, No edema present, Faint varicosities, No LDS present  Neurologic: CN 2-12 intact , Pain and light touch intact in extremities , Motor exam as listed above  Psychiatric: Judgement intact, Mood & affect appropriate for pt's clinical situation  Dermatologic: See M/S exam for extremity exam, No rashes otherwise noted  Lymph : Palpable lymph nodes: None   Non-Invasive Vascular Imaging  BLE ABI (09/22/16)  RIGHT   LEFT    PRESSURE WAVEFORM  PRESSURE WAVEFORM  BRACHIAL 136 Triphasic BRACHIAL 148 Triphasic     DP 107 Monophasic  AT 31 Barely audible     PT  Barely audible PT 111 Monophasic  PER  Barely audible       RIGHT LEFT  ABI 0.21 in AT 0.75   Critical limb ischemia in R leg.  Moderate disease in L leg.   RLE Venous duplex (  09/02/16) - Findings consistent with acute deep vein thrombosis involving the   right posterial tibial vein and right peroneal vein. - No evidence of Baker&'s cyst on the right.   Laboratory: CBC:    Component Value Date/Time   WBC 10.4 09/22/2016 0412   RBC 3.13 (L) 09/22/2016 0412   HGB 9.1 (L) 09/22/2016 0412   HCT 29.2 (L) 09/22/2016 0412   PLT 376 09/22/2016 0412   MCV 93.3 09/22/2016 0412   MCH 29.1 09/22/2016 0412   MCHC 31.2 09/22/2016 0412   RDW 16.8 (H) 09/22/2016 0412   LYMPHSABS 0.5 (L) 09/19/2016 2320   MONOABS 1.4 (H) 09/19/2016 2320   EOSABS 0.1 09/19/2016 2320   BASOSABS 0.0 09/19/2016 2320    BMP:    Component Value Date/Time   NA 139 09/22/2016 0412   K 4.3 09/22/2016 0412   CL 102 09/22/2016 0412   CO2 28 09/22/2016 0412   GLUCOSE 165 (H) 09/22/2016 0412   BUN 25 (H) 09/22/2016 0412   CREATININE 1.33 (H) 09/22/2016 0412   CREATININE 1.59 (H) 05/27/2016 1020   CALCIUM 8.5 (L) 09/22/2016 0412   GFRNONAA 39 (L) 09/22/2016 0412   GFRAA 46 (L) 09/22/2016 0412    Coagulation: Lab Results  Component  Value Date   INR 2.10 09/22/2016   INR 2.05 09/21/2016   INR 1.52 09/20/2016   No results found for: PTT  Lipids:    Component Value Date/Time   CHOL 134 01/07/2015 0543   TRIG 122 01/07/2015 0543   HDL 36 (L) 01/07/2015 0543   CHOLHDL 3.7 01/07/2015 0543   VLDL 24 01/07/2015 0543   LDLCALC 74 01/07/2015 0543    Radiology: Dg Foot 2 Views Right  Result Date: 09/22/2016 CLINICAL DATA:  Pain in the right heel for 2 weeks, diabetes, no trauma EXAM: RIGHT FOOT - 2 VIEW COMPARISON:  None FINDINGS: No fracture is seen. No bony erosion is noted. Small calcaneal degenerative spurs are present. There is degenerative change of the right first MTP joint. No erosion is noted. The bones are slightly osteopenic some irregularity of the proximal phalanx of the right fifth toe is noted and clinical correlation is recommended to exclude acute abnormality. IMPRESSION: 1. Small calcaneal degenerative spurs.  No acute abnormality. 2. Slight irregularity of the proximal phalanx of the right fifth toe. Correlate clinically. Electronically Signed   By: Dwyane Dee M.D.   On: 09/22/2016 11:08    Medical Decision Making  [name] is a 70 y.o. female who presents with: RLE critical limb ischemia, likely thrombosed bypasses in R leg, s/p L EIA to PT bypass, multiple co-morbidities, DVT   Pt's RLE ABI and sx and exam are consistent with thrombosis of the R leg bypasses.    The patient has no evidence of threatened limb as she has intact motor and sensation and dopplerable PT and AT signals in the R foot.  The patient's hx suggest she is out of the window for thrombolysis. Given the limb threatening status of this patient, I recommend an aggressive work up including proceeding with an: Aortogram, Right runoff  I discussed with the patient the nature of angiographic procedures, especially the limited patencies of any endovascular intervention. The patient is aware of that the risks of an angiographic  procedure include but are not limited to: bleeding, infection, access site complications, embolization, rupture of treated vessel, dissection, possible need for emergent surgical intervention, and possible need for surgical procedures to treat the patient's pathology. The  patient is aware of the risks and agrees to proceed.   Unfortunately, the patient is anticoagulated for her R leg DVT, so any angiography will have to be delayed until the warfarin has reversed.  This likely means the earliest a study can be completed is Monday.  I discussed in depth with the patient the nature of atherosclerosis, and emphasized the importance of maximal medical management including strict control of blood pressure, blood glucose, and lipid levels, antiplatelet agents, obtaining regular exercise, and cessation of smoking.  The patient is aware that without maximal medical management the underlying atherosclerotic disease process will progress, limiting the benefit of any interventions. The patient is currently on a statin:  Zocor. The patient is currently not on an antiplatelet due to bleed risks given the patient is on anticoagulation.  Thank you for allowing us to participate in this patient's care.   Leonides SakeBrian Lake Breeding, MD [facility] Office: [phone] Pager: [phone]  09/22/2016, 7:40 PM

## 2016-09-22 NOTE — Progress Notes (Signed)
ANTICOAGULATION CONSULT NOTE - Follow Up Consult  Pharmacy Consult for warfarin Indication: DVT  Allergies  Allergen Reactions  . Aldactone [Spironolactone] Other (See Comments)    Severe hyperkalemia   . Lisinopril Other (See Comments) and Cough    Hypotension also  . Crestor [Rosuvastatin Calcium] Other (See Comments)    Muscle Pain  . Vicodin [Hydrocodone-Acetaminophen] Nausea And Vomiting    Patient Measurements: Height: 5\' 2"  (157.5 cm) Weight: 167 lb 3.2 oz (75.8 kg) IBW/kg (Calculated) : 50.1 Heparin Dosing Weight: 65.7kg  Vital Signs: Temp: 98.2 F (36.8 C) (11/30 0518) Temp Source: Oral (11/30 0518) BP: 169/70 (11/30 0518) Pulse Rate: 81 (11/30 0518)  Labs:  Recent Labs  09/19/16 2320 09/20/16 0305 09/20/16 1815 09/21/16 0559 09/21/16 0957 09/22/16 0412  HGB 9.8*  --   --  9.2*  --  9.1*  HCT 30.8*  --   --  29.4*  --  29.2*  PLT 429*  --   --  392  --  376  LABPROT  --  18.5*  --  23.4*  --  23.9*  INR  --  1.52  --  2.05  --  2.10  HEPARINUNFRC  --   --  <0.10* 0.48  --  <0.10*  CREATININE 1.91*  --   --   --  1.33* 1.33*    Estimated Creatinine Clearance: 37.5 mL/min (by C-G formula based on SCr of 1.33 mg/dL (H)).   Assessment: [age] YOF presenting with AMS. Patient on warfarin PTA but heparin added due to subtherapeutic INR on admission, Heparin D/C 11/29. Pharmacy consulted for warfarin dosing. Hgb stable, Pltc wnl. Small increase in INR, patient continues to tolerate diet which should keep INR stable. No bleeding noted.  INR 1.52 > 2.05 > 2.10 (therapeutic x 2)  Goal of Therapy:  INR 2-3  Monitor platelets by anticoagulation protocol: Yes   Plan:  Warfarin 5 mg x 1 Daily INR, CBC Monitor s/sx of bleeding, PO intake, DDI, clinical progress   Thank you for allowing pharmacy to participate in this patient's care. Virgel GessStephen Nunzio Banet  PharmD candidate 09/22/2016,8:42 AM

## 2016-09-23 DIAGNOSIS — I998 Other disorder of circulatory system: Secondary | ICD-10-CM

## 2016-09-23 LAB — PROTIME-INR
INR: 2.26
Prothrombin Time: 25.3 seconds — ABNORMAL HIGH (ref 11.4–15.2)

## 2016-09-23 LAB — GLUCOSE, CAPILLARY
GLUCOSE-CAPILLARY: 138 mg/dL — AB (ref 65–99)
GLUCOSE-CAPILLARY: 140 mg/dL — AB (ref 65–99)
GLUCOSE-CAPILLARY: 166 mg/dL — AB (ref 65–99)
Glucose-Capillary: 229 mg/dL — ABNORMAL HIGH (ref 65–99)
Glucose-Capillary: 162 mg/dL — ABNORMAL HIGH (ref 65–99)

## 2016-09-23 NOTE — Progress Notes (Signed)
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 CBG was noted to be 28. There was also some concern for narcotic overdose. She had good response to Narcan. Patient was hospitalized for further management.  Reason for Visit: Altered mental status  Consultants: Vascular surgery  Procedures:  Right lower extremity arterial Dopplers Right ABIs indicate a severe reduction in arterial flow at rest. Unable to obtain all pressures due to venous signals overriding the arterial signals. Left ABI indicates a moderate reduction in arterial flow at rest.  Antibiotics: None  Subjective/Interval History: Patient continues to have pain in her right foot. Denies any shortness of breath, chest pain. Denies any nausea or vomiting.   ROS: Denies headaches. No dizziness.  Objective:  Vital Signs  Vitals:   09/23/16 0335 09/23/16 0345 09/23/16 0907 09/23/16 1152  BP:  (!) 153/51    Pulse: 84 84 94 92  Resp: 18 18 18 18   Temp:  98.4 F (36.9 C)    TempSrc:  Oral    SpO2: 96% 96% 94% 95%  Weight:  73.3 kg (161 lb 8 oz)    Height:        Intake/Output Summary (Last 24 hours) at 09/23/16 1320 Last data filed at 09/23/16 0946  Gross per 24 hour  Intake              240 ml  Output                0 ml  Net              240 ml   Filed Weights   09/21/16 0643 09/22/16 0558 09/23/16 0345  Weight: 73.9 kg (162 lb 14.4 oz) 75.8 kg (167 lb 3.2 oz) 73.3 kg (161 lb 8 oz)    General appearance: alert, cooperative, appears stated age and no  distress Tracheostomy Resp: clear to auscultation bilaterally Cardio: regular rate and rhythm, S1, S2 normal, no murmur, click, rub or gallop GI: soft, non-tender; bowel sounds normal; no masses,  no organomegaly Extremities: Right foot is slightly colder compared to the left. Less cold today compared to yesterday. Difficult to appreciate the dorsalis pedis pulses. No signs of ischemia. Neurologic: Awake and alert. Oriented 3.  Lab Results:  Data Reviewed: I have personally reviewed following labs and imaging studies  CBC:  Recent Labs Lab 09/19/16 2320 09/21/16 0559 09/22/16 0412  WBC 15.5* 12.6* 10.4  NEUTROABS 13.5*  --   --   HGB 9.8* 9.2* 9.1*  HCT 30.8* 29.4* 29.2*  MCV 90.6 91.9 93.3  PLT 429* 392 376    Basic Metabolic Panel:  Recent Labs Lab 09/19/16 2320 09/20/16 0050 09/21/16 0957 09/22/16 0412  NA 133*  --  140 139  K 6.1* 4.0 3.9 4.3  CL 98*  --  104 102  CO2 26  --  27 28  GLUCOSE 84  --  167* 165*  BUN 39*  --  23* 25*  CREATININE 1.91*  --  1.33* 1.33*  CALCIUM 8.7*  --  8.1* 8.5*    GFR: Estimated Creatinine Clearance: 36.9 mL/min (by C-G formula based on  SCr of 1.33 mg/dL (H)).  Liver Function Tests:  Recent Labs Lab 09/19/16 2320 09/21/16 0957  AST 67* 18  ALT 24 15  ALKPHOS 66 62  BILITOT 1.9* 0.5  PROT 6.9 6.0*  ALBUMIN 2.9* 2.4*    Coagulation Profile:  Recent Labs Lab 09/20/16 0305 09/21/16 0559 09/22/16 0412 09/23/16 0345  INR 1.52 2.05 2.10 2.26    CBG:  Recent Labs Lab 09/22/16 2022 09/22/16 2347 09/23/16 0347 09/23/16 0819 09/23/16 1147  GLUCAP 109* 154* 138* 140* 229*     Recent Results (from the past 240 hour(s))  Urine culture     Status: Abnormal   Collection Time: 09/19/16 11:21 PM  Result Value Ref Range Status   Specimen Description URINE, CATHETERIZED  Final   Special Requests NONE  Final   Culture >=100,000 COLONIES/mL KLEBSIELLA PNEUMONIAE (A)  Final   Report Status 09/22/2016 FINAL   Final   Organism ID, Bacteria KLEBSIELLA PNEUMONIAE (A)  Final      Susceptibility   Klebsiella pneumoniae - MIC*    AMPICILLIN >=32 RESISTANT Resistant     CEFAZOLIN <=4 SENSITIVE Sensitive     CEFTRIAXONE <=1 SENSITIVE Sensitive     CIPROFLOXACIN <=0.25 SENSITIVE Sensitive     GENTAMICIN <=1 SENSITIVE Sensitive     IMIPENEM <=0.25 SENSITIVE Sensitive     NITROFURANTOIN 64 INTERMEDIATE Intermediate     TRIMETH/SULFA <=20 SENSITIVE Sensitive     AMPICILLIN/SULBACTAM 4 SENSITIVE Sensitive     PIP/TAZO <=4 SENSITIVE Sensitive     Extended ESBL NEGATIVE Sensitive     * >=100,000 COLONIES/mL KLEBSIELLA PNEUMONIAE  Blood culture (routine x 2)     Status: Abnormal   Collection Time: 09/19/16 11:44 PM  Result Value Ref Range Status   Specimen Description BLOOD RIGHT ARM  Final   Special Requests BOTTLES DRAWN AEROBIC AND ANAEROBIC 5ML  Final   Culture  Setup Time   Final    GRAM POSITIVE COCCI IN PAIRS IN CHAINS ANAEROBIC BOTTLE ONLY Organism ID to follow CRITICAL RESULT CALLED TO, READ BACK BY AND VERIFIED WITH: GREG ABBOTT,PHARMD @0436  09/21/16 MKELLY,MLT    Culture (A)  Final    VIRIDANS STREPTOCOCCUS THE SIGNIFICANCE OF ISOLATING THIS ORGANISM FROM A SINGLE SET OF BLOOD CULTURES WHEN MULTIPLE SETS ARE DRAWN IS UNCERTAIN. PLEASE NOTIFY THE MICROBIOLOGY DEPARTMENT WITHIN ONE WEEK IF SPECIATION AND SENSITIVITIES ARE REQUIRED.    Report Status 09/22/2016 FINAL  Final  Blood Culture ID Panel (Reflexed)     Status: Abnormal   Collection Time: 09/19/16 11:44 PM  Result Value Ref Range Status   Enterococcus species NOT DETECTED NOT DETECTED Final   Listeria monocytogenes NOT DETECTED NOT DETECTED Final   Staphylococcus species NOT DETECTED NOT DETECTED Final   Staphylococcus aureus NOT DETECTED NOT DETECTED Final   Streptococcus species DETECTED (A) NOT DETECTED Final    Comment: CRITICAL RESULT CALLED TO, READ BACK BY AND VERIFIED WITH: GREG ABBOTT, PHARMD @0436  09/21/16 MKELLY,MLT     Streptococcus agalactiae NOT DETECTED NOT DETECTED Final   Streptococcus pneumoniae NOT DETECTED NOT DETECTED Final   Streptococcus pyogenes NOT DETECTED NOT DETECTED Final   Acinetobacter baumannii NOT DETECTED NOT DETECTED Final   Enterobacteriaceae species NOT DETECTED NOT DETECTED Final   Enterobacter cloacae complex NOT DETECTED NOT DETECTED Final   Escherichia coli NOT DETECTED NOT DETECTED Final   Klebsiella oxytoca NOT DETECTED NOT DETECTED Final   Klebsiella pneumoniae NOT DETECTED NOT DETECTED Final   Proteus species NOT DETECTED NOT DETECTED Final  Serratia marcescens NOT DETECTED NOT DETECTED Final   Haemophilus influenzae NOT DETECTED NOT DETECTED Final   Neisseria meningitidis NOT DETECTED NOT DETECTED Final   Pseudomonas aeruginosa NOT DETECTED NOT DETECTED Final   Candida albicans NOT DETECTED NOT DETECTED Final   Candida glabrata NOT DETECTED NOT DETECTED Final   Candida krusei NOT DETECTED NOT DETECTED Final   Candida parapsilosis NOT DETECTED NOT DETECTED Final   Candida tropicalis NOT DETECTED NOT DETECTED Final  Blood culture (routine x 2)     Status: None (Preliminary result)   Collection Time: 09/20/16 12:35 AM  Result Value Ref Range Status   Specimen Description BLOOD LEFT ARM  Final   Special Requests BOTTLES DRAWN AEROBIC AND ANAEROBIC  Final   Culture NO GROWTH 3 DAYS  Final   Report Status PENDING  Incomplete  MRSA PCR Screening     Status: None   Collection Time: 09/20/16  6:14 PM  Result Value Ref Range Status   MRSA by PCR NEGATIVE NEGATIVE Final    Comment:        The GeneXpert MRSA Assay (FDA approved for NASAL specimens only), is one component of a comprehensive MRSA colonization surveillance program. It is not intended to diagnose MRSA infection nor to guide or monitor treatment for MRSA infections.       Radiology Studies: Dg Foot 2 Views Right  Result Date: 09/22/2016 CLINICAL DATA:  Pain in the right heel for 2 weeks,  diabetes, no trauma EXAM: RIGHT FOOT - 2 VIEW COMPARISON:  None FINDINGS: No fracture is seen. No bony erosion is noted. Small calcaneal degenerative spurs are present. There is degenerative change of the right first MTP joint. No erosion is noted. The bones are slightly osteopenic some irregularity of the proximal phalanx of the right fifth toe is noted and clinical correlation is recommended to exclude acute abnormality. IMPRESSION: 1. Small calcaneal degenerative spurs.  No acute abnormality. 2. Slight irregularity of the proximal phalanx of the right fifth toe. Correlate clinically. Electronically Signed   By: Dwyane Dee M.D.   On: 09/22/2016 11:08     Medications:  Scheduled: . aspirin EC  81 mg Oral Daily  . budesonide  0.25 mg Nebulization BID  . carvedilol  12.5 mg Oral BID WC  . cefpodoxime  200 mg Oral Q12H  . dicyclomine  20 mg Oral TID AC  . DULoxetine  60 mg Oral QHS  . ferrous sulfate  325 mg Oral Q breakfast  . gabapentin  300 mg Oral QHS  . hydrALAZINE  25 mg Oral Q8H  . insulin aspart  0-15 Units Subcutaneous Q4H  . insulin glargine  5 Units Subcutaneous Daily  . isosorbide mononitrate  15 mg Oral Daily  . levothyroxine  75 mcg Oral QAC breakfast  . multivitamin with minerals  1 tablet Oral Daily  . pantoprazole  40 mg Oral Daily  . simvastatin  40 mg Oral QPM  . tiZANidine  2 mg Oral Daily  . torsemide  40 mg Oral Daily   Continuous:  ZOX:[id number], albuterol, ipratropium-albuterol, oxyCODONE-acetaminophen, polyethylene glycol, senna-docusate  Assessment/Plan:  Principal Problem:   Toxic metabolic encephalopathy Active Problems:   Chronic combined systolic and diastolic CHF, NYHA class 2 (EF 30-35%) per ECHO 2017   Hypertension   Chronic respiratory failure with hypoxia (HCC)   Hypoglycemia   Altered mental status   Uncontrolled type 2 diabetes mellitus with complication (HCC)   Essential hypertension   Acute deep vein thrombosis (DVT) of  distal end of  right lower extremity (HCC)    Right foot pain/Ischemic pain Arterial Dopplers show severely limited circulation in the right. She is status post vascular surgery to that lower extremity in the past and concern is for occlusion of her old grafts. Vascular surgery was consulted last night. Appreciate their input. Plan is for an arteriogram. We'll have to wait for the INR to be corrected.   Acute Metabolic Encephalopathy Etiology is thought to be multifactorial including hypoglycemia, opioid overdose (patient's medication administered by SNF). Patient is now back to baseline.  Diabetes type 2 uncontrolled with complications/Hypoglycemia Patient is on long acting insulin at her skilled nursing facility. She did have evidence for hypoglycemia at presentation and also in the hospital. Patient denies any recent changes to her medication dosage. HbA1c is 8.7. CBGs are now elevated. On Lantus here at a lower dose. Will need to lower her dose of Tresiba at the time of discharge.   Questionable Sepsis Patient was noted to have leukocytosis, however without fever. Urine culture is again growing Klebsiella. She grew the same bacteria on November 15. Based on discharge summary it appears that she was treated with Keflex. Sensitivities are available. Changed to [facility] for now.  Also, one out of 2 blood cultures is growing strep viridans. This is most likely a contaminant.  Bacteremia with strep viridans Only one set is positive for now. Other set remains negative. This is most likely a contaminant. Patient has not had any fevers.   Urinary tract infection with Klebsiella Continue Vantin.  Essential hypertension  Stable. Continue home medications.  Chronic Systolic and Diastolic CHF Compensated. Continue home medications.  Pulmonary hypertension Stable  Chronic respiratory failure (Chronic Tracheostomy) Stable, trach care  RLE DVT Warfarin discontinued due to need for angiogram of the lower  extremity. Heparin to be initiated when INR is less than 2. If INR does not come down, as anticipated, we may have to give her vitamin K.  DVT Prophylaxis: On warfarin    Code Status: Full code  Family Communication: Discussed with the patient  Disposition Plan: Management as outlined above. Await vascular investigations. Will go back to skilled nursing facility when medically ready.    LOS: 3 days   [facility]  [facility] Pager [phone] 09/23/2016, 1:20 PM  If 7PM-7AM, please contact night-coverage at [email or web address], password [facility]

## 2016-09-23 NOTE — Progress Notes (Signed)
ANTICOAGULATION CONSULT NOTE - Initial Consult  Pharmacy Consult for warfarin to heparin Indication: DVT PTA, but now R. foot ischemia  Allergies  Allergen Reactions  . Aldactone [Spironolactone] Other (See Comments)    Severe hyperkalemia   . Lisinopril Other (See Comments) and Cough    Hypotension also  . Crestor [Rosuvastatin Calcium] Other (See Comments)    Muscle Pain  . Vicodin [Hydrocodone-Acetaminophen] Nausea And Vomiting    Patient Measurements: Height: 5\' 2"  (157.5 cm) Weight: 161 lb 8 oz (73.3 kg) IBW/kg (Calculated) : 50.1 Heparin Dosing Weight: 65.7 kg  Vital Signs: Temp: 98.4 F (36.9 C) (12/01 0345) Temp Source: Oral (12/01 0345) BP: 153/51 (12/01 0345) Pulse Rate: 94 (12/01 0907)  Labs:  Recent Labs  09/20/16 1815 09/21/16 0559 09/21/16 0957 09/22/16 0412 09/23/16 0345  HGB  --  9.2*  --  9.1*  --   HCT  --  29.4*  --  29.2*  --   PLT  --  392  --  376  --   LABPROT  --  23.4*  --  23.9* 25.3*  INR  --  2.05  --  2.10 2.26  HEPARINUNFRC <0.10* 0.48  --  <0.10*  --   CREATININE  --   --  1.33* 1.33*  --     Estimated Creatinine Clearance: 36.9 mL/min (by C-G formula based on SCr of 1.33 mg/dL (H)).   Medical History: Past Medical History:  Diagnosis Date  . Anemia   . Arthritis    "hands, back" (06/28/2016)  . BRBPR (bright red blood per rectum) 06/28/2016  . CAD (coronary artery disease)    a. S/p CABG and stenting // b. Myoview 3/16: high risk EF 25%, ant-apical AK, ant scar with peri-infarct ischemia // c. LHC 3/16 - EF 45%, inferior hypokinesis, oLAD 100, oLCx 40, branch vessel 50, pRCA 100 dRCA , RV br 90, S-RCA occluded, S-LAD patent with inf-apical LAD beyond graft 70 >> med Rx  . Carotid artery disease (HCC)    a. s/p L CEA // b. Carotid US 4/16: R 40-59%; L CEA patent with 40-59%  . Chronic combined systolic and diastolic CHF (congestive heart failure) (HCC)    a. 11/2011 Echo with EF 30-35%, global hypokinesis, and inferior  akinesis;  b. 12/2014 Echo: EF 50%, mod LVH, Ao sclerosis w/o stenosis, mildly dil LA, mild to mod RV dysfxn. // c. Echo 10/16: Moderate LVH, EF 35-40%, inferior AK, grade 1 diastolic dysfunction, MAC, mild MR, moderate LAE  . Chronic lower back pain   . CKD (chronic kidney disease), stage III   . COPD (chronic obstructive pulmonary disease) (HCC)    a. prn and HS supplemental O2.  . DDD (degenerative disc disease), lumbar   . Depression   . GERD (gastroesophageal reflux disease)   . History of blood transfusion ~ 2015; 06/28/2016   "anemia"  . History of IBS   . History of tracheostomy 08/26/2013   Dr. Jenne PaneBates  . Hyperlipidemia   . Hypertension   . Hypothyroidism    Goiter  . MI (myocardial infarction) 1997  . On home oxygen therapy    "4L trach collar at night & prn during the day" (09/20/2016)  . PAD (peripheral artery disease) (HCC)   . Stroke [facility](HCC)    MRI 11/2011 with remote occipital lobe. MRA with moderate left focal vertebral artery stenosis  . Type II diabetes mellitus (HCC)     Medications:  See EMR  Assessment: Pt now with possible  occluded bypass grafts in right leg. Plan for arteriogram when INR down. Pharmacy consulted to start heparin once INR < 2.  Pertinent Hx: prior left fem PT bypass several years ago. Also, apparently had right fem AK pop and then pop PT bypass elsewhere many years ago.  She has about 2 week history of rest pain right foot.  INR currently therapeutic at 2.26. No bleeding noted. CBC stable. Refused breakfast tray today.  Goal of Therapy:  INR 2-3 Heparin level 0.3-0.7 units/ml Monitor platelets by anticoagulation protocol: Yes   Plan:  Warfarin on hold Start heparin infusion when INR < 2 Daily INR Monitor H&H and platelets   Thank you for allowing us to participate in this patients care. Signe Coltonya C Smrithi Pigford, PharmD Pager: [phone] 09/23/2016,10:59 AM

## 2016-09-23 NOTE — Consult Note (Signed)
[facility]  Pt known to me from prior left fem PT  Bypass several years ago.  She has about 2 week history of rest pain right foot.  She apparently had right fem AK pop and then pop PT bypass elsewhere many years ago.  Pt is ambulatory with walker.     Objective (!) 153/51 94 98.4 F (36.9 C) (Oral) 18 94%  Intake/Output Summary (Last 24 hours) at 09/23/16 0944 Last data filed at 09/23/16 0100  Gross per 24 hour  Intake              120 ml  Output                0 ml  Net              120 ml   Right and left foot symmetrically warm 2+ left PT pulse, absent right pedal pulses  Assessment/Planning: Most likely has occluded old bypass grafts right leg.  Agree with arteriogram when INR down.  Discussed with pt today that she may have limited options due to lack of conduit for bypass and possibly no distal targets.  Will call her daughter today at her request  Will look for angio date next week  Fabienne BrunsFields, Charles 09/23/2016 9:44 AM --  Laboratory Lab Results:  Recent Labs  09/21/16 0559 09/22/16 0412  WBC 12.6* 10.4  HGB 9.2* 9.1*  HCT 29.4* 29.2*  PLT 392 376   BMET  Recent Labs  09/21/16 0957 09/22/16 0412  NA 140 139  K 3.9 4.3  CL 104 102  CO2 27 28  GLUCOSE 167* 165*  BUN 23* 25*  CREATININE 1.33* 1.33*  CALCIUM 8.1* 8.5*    COAG Lab Results  Component Value Date   INR 2.26 09/23/2016   INR 2.10 09/22/2016   INR 2.05 09/21/2016   No results found for: PTT

## 2016-09-23 NOTE — Evaluation (Signed)
Occupational Therapy Evaluation Patient Details Name: [name] MRN: [id number] DOB: 05/26/1946 Today's Date: 09/23/2016    History of Present Illness Pt is a 70 y/o female admitted secondary to AMS, toxic metabolic encephalopathy. PMH including but not limited to chronic trach dependent, CAD, CKD, COPD, PAD, CVA (2013), DM and hx of CABG.    Clinical Impression   Pt reports she was managing ADL independently PTA. Pt declining to perform transfers at this time due to R foot pain but agreeable to sit EOB and perform grooming activity with max verbal encouragement. Pt able to perform grooming in sitting with set up, requires supervision for bed mobility. Recommending return to SNF upon d/c to maximize independence and safety with ADL and functional mobility. Pt would benefit from continued skilled OT to address established goals.    Follow Up Recommendations  SNF    Equipment Recommendations  Other (comment) (TBD at next venue)    Recommendations for Other Services       Precautions / Restrictions Precautions Precautions: Fall Precaution Comments: Chronic Trach Restrictions Weight Bearing Restrictions: No      Mobility Bed Mobility Overal bed mobility: Needs Assistance Bed Mobility: Supine to Sit;Sit to Supine     Supine to sit: Supervision Sit to supine: Supervision   General bed mobility comments: Supervision for safety; no physical assist required. HOB elevated with use of bed rails. Pt able to scoot self up in bed with use of bed rails.  Transfers                 General transfer comment: Pt declining to perform transfers at the time due to R foot pain.    Balance Overall balance assessment: Needs assistance Sitting-balance support: Feet unsupported;No upper extremity supported Sitting balance-Leahy Scale: Good                                      ADL Overall ADL's : Needs assistance/impaired     Grooming: Set  up;Sitting;Wash/dry face;Oral care   Upper Body Bathing: Set up;Sitting   Lower Body Bathing: Moderate assistance;Sitting/lateral leans   Upper Body Dressing : Set up;Sitting   Lower Body Dressing: Maximal assistance;Sitting/lateral leans                 General ADL Comments: Pt declining to stand at this time due to R foot pain. Agreeable to sitting EOB and performing grooming activity with max encouragement.     Vision     Perception     Praxis      Pertinent Vitals/Pain Pain Assessment: 0-10 Pain Score: 10-Worst pain ever Pain Location: R foot Pain Descriptors / Indicators: Aching;Grimacing Pain Intervention(s): Monitored during session     Hand Dominance Right   Extremity/Trunk Assessment Upper Extremity Assessment Upper Extremity Assessment: Overall WFL for tasks assessed   Lower Extremity Assessment Lower Extremity Assessment: Defer to PT evaluation   Cervical / Trunk Assessment Cervical / Trunk Assessment: Kyphotic   Communication Communication Communication: Tracheostomy;Passy-Muir valve   Cognition Arousal/Alertness: Awake/alert Behavior During Therapy: WFL for tasks assessed/performed Overall Cognitive Status: Within Functional Limits for tasks assessed                     General Comments       Exercises       Shoulder Instructions      Home Living Family/patient expects to be discharged to::  Skilled nursing facility                                        Prior Functioning/Environment Level of Independence: Independent with assistive device(s)        Comments: Was ambulating with RW and performing ADL independently at SNF PTA.        OT Problem List: Decreased activity tolerance;Impaired balance (sitting and/or standing);Pain   OT Treatment/Interventions: Self-care/ADL training;Therapeutic exercise;Energy conservation;DME and/or AE instruction;Therapeutic activities;Patient/family education;Balance  training    OT Goals(Current goals can be found in the care plan section) Acute Rehab OT Goals Patient Stated Goal: decrease R foot pain OT Goal Formulation: With patient Time For Goal Achievement: 10/07/16 Potential to Achieve Goals: Good ADL Goals Pt Will Perform Grooming: with supervision;standing Pt Will Perform Upper Body Bathing: with modified independence;sitting Pt Will Perform Lower Body Bathing: with supervision;sit to/from stand Pt Will Transfer to Toilet: with supervision;ambulating;regular height toilet Pt Will Perform Toileting - Clothing Manipulation and hygiene: with supervision;sit to/from stand  OT Frequency: Min 2X/week   Barriers to D/C:            Co-evaluation              End of Session Equipment Utilized During Treatment: Oxygen  Activity Tolerance: Patient limited by pain Patient left: in bed;with call bell/phone within reach;with bed alarm set   Time: 1044-1101 OT Time Calculation (min): 17 min Charges:  OT General Charges $OT Visit: 1 Procedure OT Evaluation $OT Eval Moderate Complexity: 1 Procedure G-Codes:     Gaye AlkenBailey A Emilynn Srinivasan M.S., OTR/L Pager: [phone]  09/23/2016, 11:09 AM

## 2016-09-23 NOTE — Care Management Important Message (Signed)
Important Message  Patient Details  Name: [name] MRN: [id number] Date of Birth: 07/24/1946   Medicare Important Message Given:  Yes    Kyla BalzarineShealy, Lupie Sawa Abena 09/23/2016, 1:14 PM

## 2016-09-24 DIAGNOSIS — I739 Peripheral vascular disease, unspecified: Secondary | ICD-10-CM

## 2016-09-24 LAB — BASIC METABOLIC PANEL
ANION GAP: 10 (ref 5–15)
BUN: 19 mg/dL (ref 6–20)
CHLORIDE: 100 mmol/L — AB (ref 101–111)
CO2: 30 mmol/L (ref 22–32)
Calcium: 8.7 mg/dL — ABNORMAL LOW (ref 8.9–10.3)
Creatinine, Ser: 1.37 mg/dL — ABNORMAL HIGH (ref 0.44–1.00)
GFR calc non Af Amer: 38 mL/min — ABNORMAL LOW (ref >60)
GFR, EST AFRICAN AMERICAN: 44 mL/min — AB (ref >60)
Glucose, Bld: 127 mg/dL — ABNORMAL HIGH (ref 65–99)
POTASSIUM: 4.1 mmol/L (ref 3.5–5.1)
SODIUM: 140 mmol/L (ref 135–145)

## 2016-09-24 LAB — CBC
HEMATOCRIT: 32 % — AB (ref 36.0–46.0)
HEMOGLOBIN: 10 g/dL — AB (ref 12.0–15.0)
MCH: 28.7 pg (ref 26.0–34.0)
MCHC: 31.3 g/dL (ref 30.0–36.0)
MCV: 92 fL (ref 78.0–100.0)
Platelets: 384 10*3/uL (ref 150–400)
RBC: 3.48 MIL/uL — AB (ref 3.87–5.11)
RDW: 16.6 % — ABNORMAL HIGH (ref 11.5–15.5)
WBC: 11.2 10*3/uL — ABNORMAL HIGH (ref 4.0–10.5)

## 2016-09-24 LAB — PROTIME-INR
INR: 2.19
PROTHROMBIN TIME: 24.7 s — AB (ref 11.4–15.2)

## 2016-09-24 LAB — GLUCOSE, CAPILLARY
GLUCOSE-CAPILLARY: 127 mg/dL — AB (ref 65–99)
GLUCOSE-CAPILLARY: 129 mg/dL — AB (ref 65–99)
GLUCOSE-CAPILLARY: 134 mg/dL — AB (ref 65–99)
GLUCOSE-CAPILLARY: 153 mg/dL — AB (ref 65–99)
Glucose-Capillary: 162 mg/dL — ABNORMAL HIGH (ref 65–99)
Glucose-Capillary: 231 mg/dL — ABNORMAL HIGH (ref 65–99)

## 2016-09-24 MED ORDER — PHYTONADIONE 5 MG PO TABS
2.5000 mg | ORAL_TABLET | Freq: Once | ORAL | Status: AC
  Administered 2016-09-24: 2.5 mg via ORAL
  Filled 2016-09-24: qty 1

## 2016-09-24 NOTE — [place] FL2 (Signed)
[place] MEDICAID FL2 LEVEL OF CARE SCREENING TOOL     IDENTIFICATION  Patient Name: [name] Birthdate: 06/05/1946 Sex: female Admission Date (Current Location): 09/19/2016  [facility] [place]   Facility and Address:  [facility] [place], [address], [place], [place] [address]      Provider Number: [id number]  Attending Physician Name and Address:  Osvaldo ShipperGokul Krishnan, MD  Relative Name and Phone Number:       Current Level of Care: Hospital Recommended Level of Care: Skilled Nursing Facility Prior Approval Number:    Date Approved/Denied:   PASRR Number: [id number]  Discharge Plan: SNF    Current Diagnoses: Patient Active Problem List   Diagnosis Date Noted  . Toxic metabolic encephalopathy 09/20/2016  . Hypoglycemia 09/20/2016  . Altered mental status 09/20/2016  . Uncontrolled type 2 diabetes mellitus with complication (HCC)   . Essential hypertension   . Acute deep vein thrombosis (DVT) of distal end of right lower extremity (HCC)   . Acute deep vein thrombosis (DVT) (HCC) 09/02/2016  . Strain and tear of left hip adductor muscle and quadricep muscle 09/02/2016  . History of lower GI bleeding-presumed diverticular 06/28/2016  . Ischemic cardiomyopathy 04/11/2016  . AV block, 1st degree 11/15/2015  . CKD (chronic kidney disease) stage 4, GFR 15-29 ml/min (HCC) 11/10/2015  . Cancer screening 11/04/2015  . COPD, severe (HCC) 11/04/2015  . Chronic respiratory failure with hypoxia (HCC) 11/04/2015  . Status post tracheostomy (HCC) 11/04/2015  . Acute on chronic respiratory failure with hypoxia (HCC) 07/28/2015  . DM type 2 causing CKD stage 3 (HCC) 07/28/2015  . Abnormal EKG 07/27/2015  . Acute respiratory failure with hypoxia (HCC) 07/27/2015  . Chronic combined systolic and diastolic CHF (congestive heart failure) (HCC) 07/27/2015  . Acute pulmonary edema (HCC)   . Abnormal nuclear stress test   . Chronic systolic  heart failure (HCC) 56/21/308609/24/2015  . Chronic airway obstruction, not elsewhere classified 07/17/2014  . Atherosclerosis of native arteries of the extremities with intermittent claudication 01/16/2014  . Aftercare following surgery of the circulatory system, NEC 09/05/2013  . History of tracheostomy 08/26/2013  . Discoloration of skin- left foot 03/28/2013  . Peripheral vascular disease, unspecified (HCC) 03/28/2013  . Nonspecific abnormal electrocardiogram (ECG) (EKG)/QT prolongation 01/01/2013  . Anemia 10/21/2012  . Occlusion and stenosis of carotid artery without mention of cerebral infarction 02/02/2012  . 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 Diabetes mellitus type 2, uncontrolled (HCC) 11/26/2011  . Hypertension 11/26/2011  . Hyperlipidemia 11/26/2011  . History of CVA (cerebrovascular accident) 11/26/2011  . 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 Peripheral neuropathy (HCC) 11/26/2011  . History of MI (myocardial infarction)   . CAD (coronary artery disease)   . Chronic combined systolic and diastolic CHF, NYHA class 2 (EF 30-35%) per ECHO 2017   . S/P CABG (coronary artery bypass graft)   . PAD (peripheral artery disease) (HCC)   . Hx-TIA (transient ischemic attack) 01/24/2011    Orientation RESPIRATION BLADDER Height & Weight     Self, Time, Situation, Place  Tracheostomy (6MM Shiley uncuffed, 6L FiO2%=28) Continent Weight: 73.8 kg (162 lb 9.6 oz) Height:  5\' 2"  (157.5 cm)  BEHAVIORAL SYMPTOMS/MOOD NEUROLOGICAL BOWEL NUTRITION STATUS   (NONE)  (NONE) Incontinent Diet (Carb Modified)  AMBULATORY STATUS COMMUNICATION OF NEEDS Skin   Extensive Assist Verbally Normal  Personal Care Assistance Level of Assistance  Bathing, Feeding, Dressing Bathing Assistance: Maximum assistance Feeding assistance: Limited assistance Dressing Assistance: Maximum assistance     Functional Limitations Info  Sight, Hearing, Speech  Sight Info: Adequate Hearing Info: Adequate Speech Info: Adequate    SPECIAL CARE FACTORS FREQUENCY  PT (By licensed PT), OT (By licensed OT)     PT Frequency: 5/week OT Frequency: 5/week            Contractures Contractures Info: Not present    Additional Factors Info  Code Status, Allergies Code Status Info: Full Allergies Info: Aldactone Spironolactone, Lisinopril, Crestor Rosuvastatin Calcium, Vicodin Hydrocodone-acetaminophen           Current Medications (09/24/2016):  This is the current hospital active medication list Current Facility-Administered Medications  Medication Dose Route Frequency Provider Last Rate Last Dose  . acetaminophen (TYLENOL) tablet 650 mg  650 mg Oral Q4H PRN Hillary BowJared M Gardner, DO   650 mg at 09/22/16 0013  . albuterol (PROVENTIL) (2.5 MG/3ML) 0.083% nebulizer solution 2.5 mg  2.5 mg Nebulization Q2H PRN Hillary BowJared M Gardner, DO      . aspirin EC tablet 81 mg  81 mg Oral Daily Hillary BowJared M Gardner, DO   81 mg at 09/24/16 [id number]  . budesonide (PULMICORT) nebulizer solution 0.25 mg  0.25 mg Nebulization BID Hillary BowJared M Gardner, DO   0.25 mg at 09/24/16 0930  . carvedilol (COREG) tablet 12.5 mg  12.5 mg Oral BID WC Hillary BowJared M Gardner, DO   12.5 mg at 09/24/16 [id number]  . cefpodoxime (VANTIN) tablet 200 mg  200 mg Oral Q12H Osvaldo ShipperGokul Krishnan, MD   200 mg at 09/24/16 0901  . dicyclomine (BENTYL) tablet 20 mg  20 mg Oral TID AC Hillary BowJared M Gardner, DO   20 mg at 09/24/16 1337  . DULoxetine (CYMBALTA) DR capsule 60 mg  60 mg Oral QHS Hillary BowJared M Gardner, DO   60 mg at 09/23/16 2135  . ferrous sulfate tablet 325 mg  325 mg Oral Q breakfast Hillary BowJared M Gardner, DO   325 mg at 09/24/16 [id number]  . gabapentin (NEURONTIN) capsule 300 mg  300 mg Oral QHS Hillary BowJared M Gardner, DO   300 mg at 09/23/16 2135  . hydrALAZINE (APRESOLINE) tablet 25 mg  25 mg Oral Q8H Hillary BowJared M Gardner, DO   25 mg at 09/24/16 [id number]  . insulin aspart (novoLOG) injection 0-15 Units  0-15 Units Subcutaneous Q4H Drema Dallasurtis J Woods, MD   3  Units at 09/24/16 1337  . insulin glargine (LANTUS) injection 5 Units  5 Units Subcutaneous Daily Osvaldo ShipperGokul Krishnan, MD   5 Units at 09/24/16 0901  . ipratropium-albuterol (DUONEB) 0.5-2.5 (3) MG/3ML nebulizer solution 3 mL  3 mL Nebulization BID PRN Hillary BowJared M Gardner, DO      . isosorbide mononitrate (IMDUR) 24 hr tablet 15 mg  15 mg Oral Daily Hillary BowJared M Gardner, DO   15 mg at 09/24/16 [id number]  . levothyroxine (SYNTHROID, LEVOTHROID) tablet 75 mcg  75 mcg Oral QAC breakfast Hillary BowJared M Gardner, DO   75 mcg at 09/24/16 [id number]  . multivitamin with minerals tablet 1 tablet  1 tablet Oral Daily Hillary BowJared M Gardner, DO   1 tablet at 09/24/16 [id number]  . oxyCODONE-acetaminophen (PERCOCET/ROXICET) 5-325 MG per tablet 1-2 tablet  1-2 tablet Oral Q6H PRN Osvaldo ShipperGokul Krishnan, MD   1 tablet at 09/24/16 1210  . pantoprazole (PROTONIX) EC tablet 40 mg  40 mg Oral Daily Hillary BowJared M Gardner, DO   40 mg at  09/24/16 0904  . polyethylene glycol (MIRALAX / GLYCOLAX) packet 17 g  17 g Oral Daily PRN Hillary Bow, DO      . senna-docusate (Senokot-S) tablet 2 tablet  2 tablet Oral QHS PRN Hillary Bow, DO      . simvastatin (ZOCOR) tablet 40 mg  40 mg Oral QPM Hillary Bow, DO   40 mg at 09/22/16 1753  . tiZANidine (ZANAFLEX) tablet 2 mg  2 mg Oral Daily Hillary Bow, DO   2 mg at 09/24/16 0865  . torsemide (DEMADEX) tablet 40 mg  40 mg Oral Daily Hillary Bow, DO   40 mg at 09/24/16 7846     Discharge Medications: Please see discharge summary for a list of discharge medications.  Relevant Imaging Results:  Relevant Lab Results:   Additional Information SS#: [id number]  Venita Lick, LCSW

## 2016-09-24 NOTE — Progress Notes (Addendum)
[facility]  Subjective  - Right foot painful   Objective (!) 161/61 93 98.4 F (36.9 C) (Oral) 18 93%  Intake/Output Summary (Last 24 hours) at 09/24/16 16100811 Last data filed at 09/24/16 0200  Gross per 24 hour  Intake              290 ml  Output                0 ml  Net              290 ml   Right foot warm to touch no DP/PT doppler signals, weak peroneal signal No open wounds on right LE   Assessment/Planning:  Most likely has occluded old bypass grafts right leg.  Agree with arteriogram when INR down. INR 2.19 today Will plan angiogram early next week  [name], [name] [facility] 09/24/2016 8:11 AM --  Laboratory Lab Results:  Recent Labs  09/22/16 0412 09/24/16 0426  WBC 10.4 11.2*  HGB 9.1* 10.0*  HCT 29.2* 32.0*  PLT 376 384   BMET  Recent Labs  09/21/16 0957 09/22/16 0412  NA 140 139  K 3.9 4.3  CL 104 102  CO2 27 28  GLUCOSE 167* 165*  BUN 23* 25*  CREATININE 1.33* 1.33*  CALCIUM 8.1* 8.5*    COAG Lab Results  Component Value Date   INR 2.19 09/24/2016   INR 2.26 09/23/2016   INR 2.10 09/22/2016   No results found for: PTT  I have independently interviewed patient and agree with PA assessment and plan above. Allow inr to trend down with heparin anticoagulation. Plan for angiogram this coming week as leg does not appear compromised at this time.  Becky Hora C. Randie Heinzain, MD [facility] [place] Office: [phone] Pager: [phone]

## 2016-09-24 NOTE — Progress Notes (Signed)
ANTICOAGULATION CONSULT NOTE - Initial Consult  Pharmacy Consult for warfarin to heparin Indication: DVT PTA, but now R. foot ischemia  Allergies  Allergen Reactions  . Aldactone [Spironolactone] Other (See Comments)    Severe hyperkalemia   . Lisinopril Other (See Comments) and Cough    Hypotension also  . Crestor [Rosuvastatin Calcium] Other (See Comments)    Muscle Pain  . Vicodin [Hydrocodone-Acetaminophen] Nausea And Vomiting    Patient Measurements: Height: 5\' 2"  (157.5 cm) Weight: 162 lb 9.6 oz (73.8 kg) IBW/kg (Calculated) : 50.1 Heparin Dosing Weight: 65.7 kg  Vital Signs: Temp: 98.3 F (36.8 C) (12/02 1453) Temp Source: Oral (12/02 1453) BP: 113/53 (12/02 1453) Pulse Rate: 71 (12/02 1453)  Labs:  Recent Labs  09/22/16 0412 09/23/16 0345 09/24/16 0426  HGB 9.1*  --  10.0*  HCT 29.2*  --  32.0*  PLT 376  --  384  LABPROT 23.9* 25.3* 24.7*  INR 2.10 2.26 2.19  HEPARINUNFRC <0.10*  --   --   CREATININE 1.33*  --  1.37*    Estimated Creatinine Clearance: 36 mL/min (by C-G formula based on SCr of 1.37 mg/dL (H)).   Medical History: Past Medical History:  Diagnosis Date  . Anemia   . Arthritis    "hands, back" (06/28/2016)  . BRBPR (bright red blood per rectum) 06/28/2016  . CAD (coronary artery disease)    a. S/p CABG and stenting // b. Myoview 3/16: high risk EF 25%, ant-apical AK, ant scar with peri-infarct ischemia // c. LHC 3/16 - EF 45%, inferior hypokinesis, oLAD 100, oLCx 40, branch vessel 50, pRCA 100 dRCA , RV br 90, S-RCA occluded, S-LAD patent with inf-apical LAD beyond graft 70 >> med Rx  . Carotid artery disease (HCC)    a. s/p L CEA // b. Carotid US 4/16: R 40-59%; L CEA patent with 40-59%  . Chronic combined systolic and diastolic CHF (congestive heart failure) (HCC)    a. 11/2011 Echo with EF 30-35%, global hypokinesis, and inferior akinesis;  b. 12/2014 Echo: EF 50%, mod LVH, Ao sclerosis w/o stenosis, mildly dil LA, mild to mod RV dysfxn.  // c. Echo 10/16: Moderate LVH, EF 35-40%, inferior AK, grade 1 diastolic dysfunction, MAC, mild MR, moderate LAE  . Chronic lower back pain   . CKD (chronic kidney disease), stage III   . COPD (chronic obstructive pulmonary disease) (HCC)    a. prn and HS supplemental O2.  . DDD (degenerative disc disease), lumbar   . Depression   . GERD (gastroesophageal reflux disease)   . History of blood transfusion ~ 2015; 06/28/2016   "anemia"  . History of IBS   . History of tracheostomy 08/26/2013   Dr. Jenne PaneBates  . Hyperlipidemia   . Hypertension   . Hypothyroidism    Goiter  . MI (myocardial infarction) 1997  . On home oxygen therapy    "4L trach collar at night & prn during the day" (09/20/2016)  . PAD (peripheral artery disease) (HCC)   . Stroke [facility](HCC)    MRI 11/2011 with remote occipital lobe. MRA with moderate left focal vertebral artery stenosis  . Type II diabetes mellitus (HCC)     Medications:  See EMR  Assessment: Pt now with possible occluded bypass grafts in right leg. Plan for arteriogram when INR down. Pharmacy consulted to start heparin once INR < 2.  Pertinent Hx: prior left fem PT bypass several years ago. Also, apparently had right fem AK pop and then pop  PT bypass elsewhere many years ago.  She has about 2 week history of rest pain right foot.  INR is still above 2 so hold off coumadin today. No bleeding noted.  Goal of Therapy:  INR 2-3 Heparin level 0.3-0.7 units/ml Monitor platelets by anticoagulation protocol: Yes   Plan:   Warfarin on hold Start heparin infusion when INR < 2 Daily INR Monitor H&H and platelets   Ulyses SouthwardMinh Pham, PharmD, BCPS, AAHIVP, CPP Infectious Disease Pharmacist Pager: [phone] 09/24/2016 3:21 PM

## 2016-09-24 NOTE — Clinical Social Work Note (Signed)
Clinical Social Work Assessment  Patient Details  Name: [name] MRN: [id number] Date of Birth: 1946-09-17  Date of referral:  09/24/16               Reason for consult:  Discharge Planning, Facility Placement                Permission sought to share information with:  Facility Sport and exercise psychologist, Family Supports Permission granted to share information::  Yes, Verbal Permission Granted  Name::     Geographical information systems officer::  SNFs  Relationship::  Daughter  Contact Information:     Housing/Transportation Living arrangements for the past 2 months:  [place] of Information:  Patient Patient Interpreter Needed:  None Criminal Activity/Legal Involvement Pertinent to Current Situation/Hospitalization:  No - Comment as needed Significant Relationships:  Adult Children Lives with:  Adult Children Do you feel safe going back to the place where you live?  Yes Need for family participation in patient care:  Yes (Comment)  Care giving concerns:  The patient does not report any concerns.   Social Worker assessment / plan:  CSW met with patient at bedside to complete assessment. The patient confirms that she was admitted from [facility] SN[facility]. She states that she will be returning to the facility at discharge. CSW explained CSW role and process of getting the patient back to the facility once medically stable. All questions answered.  Employment status:  Retired Forensic scientist:  [employer] PT Recommendations:  [place] / Referral to community resources:  [place]  Patient/Family's Response to care:  The patient is happy with the care she has received.  Patient/Family's Understanding of and Emotional Response to Diagnosis, Current Treatment, and Prognosis:  The Patient appears to have a fair understanding of her diagnosis and prognosis. She appears to be coping well with this  hospitalization and understands that she will need to return to [facility] at time of discharge.   Emotional Assessment Appearance:  Appears stated age Attitude/Demeanor/Rapport:  Other (The patient is appropriate and welcoming of CSW.) Affect (typically observed):  Accepting, Calm, Pleasant, Appropriate Orientation:  Oriented to Self, Oriented to Place, Oriented to  Time, Oriented to Situation Alcohol / Substance use:  Not Applicable Psych involvement (Current and /or in the community):  No (Comment)  Discharge Needs  Concerns to be addressed:  Discharge Planning Concerns Readmission within the last 30 days:  No Current discharge risk:  Physical Impairment, Chronically ill Barriers to Discharge:  Continued Medical Work up   Rigoberto Noel, LCSW 09/24/2016, 2:35 PM

## 2016-09-24 NOTE — Progress Notes (Signed)
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 CBG was noted to be 28. There was also some concern for narcotic overdose. She had good response to Narcan. Patient was hospitalized for further management.  Reason for Visit: Altered mental status  Consultants: Vascular surgery  Procedures:  Right lower extremity arterial Dopplers Right ABIs indicate a severe reduction in arterial flow at rest. Unable to obtain all pressures due to venous signals overriding the arterial signals. Left ABI indicates a moderate reduction in arterial flow at rest.  Antibiotics: None  Subjective/Interval History: Patient continues to have pain in her right foot. Not much improvement compared to the last few days. Denies any shortness of breath, chest pain. Denies any nausea or vomiting.   ROS: Denies headaches. No dizziness.  Objective:  Vital Signs  Vitals:   09/24/16 0353 09/24/16 0500 09/24/16 0535 09/24/16 0901  BP:   (!) 161/61 (!) 145/56  Pulse: 81  93 88  Resp: 18  18   Temp:   98.4 F (36.9 C)   TempSrc:   Oral   SpO2: 94%  93%   Weight:  73.8 kg (162 lb 9.6 oz)    Height:        Intake/Output Summary (Last 24 hours) at 09/24/16 0919 Last data filed at 09/24/16 0200  Gross per 24 hour  Intake              290 ml  Output                0 ml  Net              290 ml   Filed Weights   09/22/16 0558 09/23/16 0345 09/24/16 0500  Weight: 75.8 kg (167 lb 3.2 oz) 73.3 kg (161 lb 8 oz) 73.8 kg (162 lb 9.6 oz)    General appearance: alert,  cooperative, appears stated age and no distress Tracheostomy Resp: clear to auscultation bilaterally Cardio: regular rate and rhythm, S1, S2 normal, no murmur, click, rub or gallop GI: soft, non-tender; bowel sounds normal; no masses,  no organomegaly Extremities: Right lower extremity not examined today as vascular surgery had recently seen the patient. Neurologic: Awake and alert. Oriented 3.  Lab Results:  Data Reviewed: I have personally reviewed following labs and imaging studies  CBC:  Recent Labs Lab 09/19/16 2320 09/21/16 0559 09/22/16 0412 09/24/16 0426  WBC 15.5* 12.6* 10.4 11.2*  NEUTROABS 13.5*  --   --   --   HGB 9.8* 9.2* 9.1* 10.0*  HCT 30.8* 29.4* 29.2* 32.0*  MCV 90.6 91.9 93.3 92.0  PLT 429* 392 376 384    Basic Metabolic Panel:  Recent Labs Lab 09/19/16 2320 09/20/16 0050 09/21/16 0957 09/22/16 0412 09/24/16 0426  NA 133*  --  140 139 140  K 6.1* 4.0 3.9 4.3 4.1  CL 98*  --  104 102 100*  CO2 26  --  27 28 30   GLUCOSE 84  --  167* 165* 127*  BUN 39*  --  23* 25* 19  CREATININE 1.91*  --  1.33* 1.33* 1.37*  CALCIUM 8.7*  --  8.1* 8.5* 8.7*    GFR: Estimated Creatinine Clearance: 36 mL/min (by C-G formula based on SCr of 1.37 mg/dL (H)).  Liver Function Tests:  Recent Labs Lab 09/19/16 2320 09/21/16 0957  AST 67* 18  ALT 24 15  ALKPHOS 66 62  BILITOT 1.9* 0.5  PROT 6.9 6.0*  ALBUMIN 2.9* 2.4*    Coagulation Profile:  Recent Labs Lab 09/20/16 0305 09/21/16 0559 09/22/16 0412 09/23/16 0345 09/24/16 0426  INR 1.52 2.05 2.10 2.26 2.19    CBG:  Recent Labs Lab 09/23/16 1640 09/23/16 2034 09/24/16 0032 09/24/16 0419 09/24/16 0816  GLUCAP 162* 166* 134* 127* 162*     Recent Results (from the past 240 hour(s))  Urine culture     Status: Abnormal   Collection Time: 09/19/16 11:21 PM  Result Value Ref Range Status   Specimen Description URINE, CATHETERIZED  Final   Special Requests NONE  Final   Culture >=100,000  COLONIES/mL KLEBSIELLA PNEUMONIAE (A)  Final   Report Status 09/22/2016 FINAL  Final   Organism ID, Bacteria KLEBSIELLA PNEUMONIAE (A)  Final      Susceptibility   Klebsiella pneumoniae - MIC*    AMPICILLIN >=32 RESISTANT Resistant     CEFAZOLIN <=4 SENSITIVE Sensitive     CEFTRIAXONE <=1 SENSITIVE Sensitive     CIPROFLOXACIN <=0.25 SENSITIVE Sensitive     GENTAMICIN <=1 SENSITIVE Sensitive     IMIPENEM <=0.25 SENSITIVE Sensitive     NITROFURANTOIN 64 INTERMEDIATE Intermediate     TRIMETH/SULFA <=20 SENSITIVE Sensitive     AMPICILLIN/SULBACTAM 4 SENSITIVE Sensitive     PIP/TAZO <=4 SENSITIVE Sensitive     Extended ESBL NEGATIVE Sensitive     * >=100,000 COLONIES/mL KLEBSIELLA PNEUMONIAE  Blood culture (routine x 2)     Status: Abnormal   Collection Time: 09/19/16 11:44 PM  Result Value Ref Range Status   Specimen Description BLOOD RIGHT ARM  Final   Special Requests BOTTLES DRAWN AEROBIC AND ANAEROBIC  Final   Culture  Setup Time   Final    GRAM POSITIVE COCCI IN PAIRS IN CHAINS ANAEROBIC BOTTLE ONLY Organism ID to follow CRITICAL RESULT CALLED TO, READ BACK BY AND VERIFIED WITH: GREG ABBOTT,PHARMD @0436  09/21/16 MKELLY,MLT    Culture (A)  Final    VIRIDANS STREPTOCOCCUS THE SIGNIFICANCE OF ISOLATING THIS ORGANISM FROM A SINGLE SET OF BLOOD CULTURES WHEN MULTIPLE SETS ARE DRAWN IS UNCERTAIN. PLEASE NOTIFY THE MICROBIOLOGY DEPARTMENT WITHIN ONE WEEK IF SPECIATION AND SENSITIVITIES ARE REQUIRED.    Report Status 09/22/2016 FINAL  Final  Blood Culture ID Panel (Reflexed)     Status: Abnormal   Collection Time: 09/19/16 11:44 PM  Result Value Ref Range Status   Enterococcus species NOT DETECTED NOT DETECTED Final   Listeria monocytogenes NOT DETECTED NOT DETECTED Final   Staphylococcus species NOT DETECTED NOT DETECTED Final   Staphylococcus aureus NOT DETECTED NOT DETECTED Final   Streptococcus species DETECTED (A) NOT DETECTED Final    Comment: CRITICAL RESULT CALLED TO,  READ BACK BY AND VERIFIED WITH: GREG ABBOTT, PHARMD @0436  09/21/16 MKELLY,MLT    Streptococcus agalactiae NOT DETECTED NOT DETECTED Final   Streptococcus pneumoniae NOT DETECTED NOT DETECTED Final   Streptococcus pyogenes NOT DETECTED NOT DETECTED Final   Acinetobacter baumannii NOT DETECTED NOT DETECTED Final   Enterobacteriaceae species NOT DETECTED NOT DETECTED Final   Enterobacter cloacae complex NOT DETECTED NOT DETECTED Final   Escherichia coli NOT DETECTED NOT DETECTED Final   Klebsiella oxytoca NOT DETECTED NOT DETECTED  Final   Klebsiella pneumoniae NOT DETECTED NOT DETECTED Final   Proteus species NOT DETECTED NOT DETECTED Final   Serratia marcescens NOT DETECTED NOT DETECTED Final   Haemophilus influenzae NOT DETECTED NOT DETECTED Final   Neisseria meningitidis NOT DETECTED NOT DETECTED Final   Pseudomonas aeruginosa NOT DETECTED NOT DETECTED Final   Candida albicans NOT DETECTED NOT DETECTED Final   Candida glabrata NOT DETECTED NOT DETECTED Final   Candida krusei NOT DETECTED NOT DETECTED Final   Candida parapsilosis NOT DETECTED NOT DETECTED Final   Candida tropicalis NOT DETECTED NOT DETECTED Final  Blood culture (routine x 2)     Status: None (Preliminary result)   Collection Time: 09/20/16 12:35 AM  Result Value Ref Range Status   Specimen Description BLOOD LEFT ARM  Final   Special Requests BOTTLES DRAWN AEROBIC AND ANAEROBIC  Final   Culture NO GROWTH 3 DAYS  Final   Report Status PENDING  Incomplete  MRSA PCR Screening     Status: None   Collection Time: 09/20/16  6:14 PM  Result Value Ref Range Status   MRSA by PCR NEGATIVE NEGATIVE Final    Comment:        The GeneXpert MRSA Assay (FDA approved for NASAL specimens only), is one component of a comprehensive MRSA colonization surveillance program. It is not intended to diagnose MRSA infection nor to guide or monitor treatment for MRSA infections.       Radiology Studies: Dg Foot 2 Views  Right  Result Date: 09/22/2016 CLINICAL DATA:  Pain in the right heel for 2 weeks, diabetes, no trauma EXAM: RIGHT FOOT - 2 VIEW COMPARISON:  None FINDINGS: No fracture is seen. No bony erosion is noted. Small calcaneal degenerative spurs are present. There is degenerative change of the right first MTP joint. No erosion is noted. The bones are slightly osteopenic some irregularity of the proximal phalanx of the right fifth toe is noted and clinical correlation is recommended to exclude acute abnormality. IMPRESSION: 1. Small calcaneal degenerative spurs.  No acute abnormality. 2. Slight irregularity of the proximal phalanx of the right fifth toe. Correlate clinically. Electronically Signed   By: Dwyane Dee M.D.   On: 09/22/2016 11:08     Medications:  Scheduled: . aspirin EC  81 mg Oral Daily  . budesonide  0.25 mg Nebulization BID  . carvedilol  12.5 mg Oral BID WC  . cefpodoxime  200 mg Oral Q12H  . dicyclomine  20 mg Oral TID AC  . DULoxetine  60 mg Oral QHS  . ferrous sulfate  325 mg Oral Q breakfast  . gabapentin  300 mg Oral QHS  . hydrALAZINE  25 mg Oral Q8H  . insulin aspart  0-15 Units Subcutaneous Q4H  . insulin glargine  5 Units Subcutaneous Daily  . isosorbide mononitrate  15 mg Oral Daily  . levothyroxine  75 mcg Oral QAC breakfast  . multivitamin with minerals  1 tablet Oral Daily  . pantoprazole  40 mg Oral Daily  . phytonadione  2.5 mg Oral Once  . simvastatin  40 mg Oral QPM  . tiZANidine  2 mg Oral Daily  . torsemide  40 mg Oral Daily   Continuous:  YKD:[id number], albuterol, ipratropium-albuterol, oxyCODONE-acetaminophen, polyethylene glycol, senna-docusate  Assessment/Plan:  Principal Problem:   Toxic metabolic encephalopathy Active Problems:   Chronic combined systolic and diastolic CHF, NYHA class 2 (EF 30-35%) per ECHO 2017   Hypertension   Chronic respiratory failure with hypoxia (HCC)  Hypoglycemia   Altered mental status   Uncontrolled type 2  diabetes mellitus with complication (HCC)   Essential hypertension   Acute deep vein thrombosis (DVT) of distal end of right lower extremity (HCC)    Right foot pain/Ischemic pain Arterial Dopplers show severely limited circulation in the right. She is status post vascular surgery to that lower extremity in the past and concern is for occlusion of her old grafts. Vascular surgery was consulted. Appreciate their input. Plan is for an arteriogram. We'll have to wait for the INR to be corrected.   RLE DVT Warfarin discontinued due to need for angiogram of the lower extremity. Heparin to be initiated when INR is less than 2. Give her 2.5 mg of vitamin K today so that she can undergo angiogram on Monday or Tuesday.  Acute Metabolic Encephalopathy Etiology is thought to be multifactorial including hypoglycemia, opioid overdose (patient's medication administered by SNF). Patient is now back to baseline.  Diabetes type 2 uncontrolled with complications/Hypoglycemia Patient is on long acting insulin at her skilled nursing facility. She did have evidence for hypoglycemia at presentation and also in the hospital. Patient denies any recent changes to her medication dosage. HbA1c is 8.7. CBGs are now elevated. On Lantus here at a lower dose. Will need to lower her dose of Tresiba at the time of discharge.   Questionable Sepsis Patient was noted to have leukocytosis, however without fever. Urine culture is again growing Klebsiella. She grew the same bacteria on November 15. Based on discharge summary it appears that she was treated with Keflex. Sensitivities are available. [facility] for [facility].  Also, one out of 2 blood cultures is growing strep viridans. This is most likely a contaminant.  Bacteremia with strep viridans Only one set is positive for now. Other set remains negative. This is most likely a contaminant. Patient has not had any fevers.   Urinary tract infection with Klebsiella Continue  Vantin. Will need at least 7 days of treatment.  Essential hypertension  Stable. Continue home medications.  Chronic Systolic and Diastolic CHF Compensated. Continue home medications.  Pulmonary hypertension Stable  Chronic respiratory failure (Chronic Tracheostomy) Stable, trach care  DVT Prophylaxis: On warfarin    Code Status: Full code  Family Communication: Discussed with the patient  Disposition Plan: Management as outlined above. Await vascular investigations. Will go back to skilled nursing facility when medically ready.    LOS: 4 days   [facility] LL[facility]RISHNAN,Adrieana Fennelly  [facility] Pager [phone] 09/24/2016, 9:19 AM  If 7PM-7AM, please contact night-coverage at [email or web address], password [email or web address]

## 2016-09-25 ENCOUNTER — Inpatient Hospital Stay (HOSPITAL_COMMUNITY): Payer: Medicare Other

## 2016-09-25 DIAGNOSIS — Z01810 Encounter for preprocedural cardiovascular examination: Secondary | ICD-10-CM

## 2016-09-25 LAB — CULTURE, BLOOD (ROUTINE X 2): Culture: NO GROWTH

## 2016-09-25 LAB — MAGNESIUM: MAGNESIUM: 2.1 mg/dL (ref 1.7–2.4)

## 2016-09-25 LAB — BASIC METABOLIC PANEL
Anion gap: 9 (ref 5–15)
BUN: 33 mg/dL — ABNORMAL HIGH (ref 6–20)
CHLORIDE: 98 mmol/L — AB (ref 101–111)
CO2: 30 mmol/L (ref 22–32)
CREATININE: 1.8 mg/dL — AB (ref 0.44–1.00)
Calcium: 8.3 mg/dL — ABNORMAL LOW (ref 8.9–10.3)
GFR calc non Af Amer: 27 mL/min — ABNORMAL LOW (ref >60)
GFR, EST AFRICAN AMERICAN: 32 mL/min — AB (ref >60)
Glucose, Bld: 312 mg/dL — ABNORMAL HIGH (ref 65–99)
POTASSIUM: 4.6 mmol/L (ref 3.5–5.1)
SODIUM: 137 mmol/L (ref 135–145)

## 2016-09-25 LAB — GLUCOSE, CAPILLARY
GLUCOSE-CAPILLARY: 148 mg/dL — AB (ref 65–99)
GLUCOSE-CAPILLARY: 316 mg/dL — AB (ref 65–99)
Glucose-Capillary: 136 mg/dL — ABNORMAL HIGH (ref 65–99)
Glucose-Capillary: 148 mg/dL — ABNORMAL HIGH (ref 65–99)
Glucose-Capillary: 151 mg/dL — ABNORMAL HIGH (ref 65–99)
Glucose-Capillary: 159 mg/dL — ABNORMAL HIGH (ref 65–99)

## 2016-09-25 LAB — PROTIME-INR
INR: 1.68
PROTHROMBIN TIME: 20 s — AB (ref 11.4–15.2)

## 2016-09-25 LAB — HEPARIN LEVEL (UNFRACTIONATED): Heparin Unfractionated: 0.18 IU/mL — ABNORMAL LOW (ref 0.30–0.70)

## 2016-09-25 MED ORDER — HEPARIN (PORCINE) IN NACL 100-0.45 UNIT/ML-% IJ SOLN
1400.0000 [IU]/h | INTRAMUSCULAR | Status: DC
  Administered 2016-09-25: 1000 [IU]/h via INTRAVENOUS
  Administered 2016-09-26: 1250 [IU]/h via INTRAVENOUS
  Administered 2016-09-27: 1400 [IU]/h via INTRAVENOUS
  Filled 2016-09-25 (×5): qty 250

## 2016-09-25 MED ORDER — TRAMADOL HCL 50 MG PO TABS
50.0000 mg | ORAL_TABLET | Freq: Four times a day (QID) | ORAL | Status: DC | PRN
  Administered 2016-09-25 – 2016-10-06 (×10): 50 mg via ORAL
  Filled 2016-09-25 (×10): qty 1

## 2016-09-25 NOTE — Progress Notes (Signed)
Right Lower Extremity Vein Map    Right Great Saphenous Vein   Segment Diameter Comment  1. Origin 9.225mm   2. High Thigh 4.274mm   3. Mid Thigh 2.553mm   4. Low Thigh 2.295mm   5. At Knee 3.838mm   6. High Calf 3.639mm   7. Low Calf 2.401mm Multiple Branches  8. Ankle 2.314mm Multiple Branches   mm    mm    mm      Left Lower Extremity Vein Map    Left Great Saphenous Vein   Segment Diameter Comment  1. Origin 4.763mm   2. High Thigh 2.569mm   3. Mid Thigh 2.392mm   4. Low Thigh 3.636mm   5. At Knee 2.765mm   6. High Calf 2.653mm   7. Low Calf 2.295mm Multiple Branches  8. Ankle 1.578mm Multiple Branches   mm    mm    mm

## 2016-09-25 NOTE — Progress Notes (Signed)
Pt's trach was suctioned by RN. When pt was asked if she wanted her trach cleaned. She refused. Pt was asked twice. Will continue to monitor.

## 2016-09-25 NOTE — Progress Notes (Signed)
  Progress Note    09/25/2016 10:00 AM * No surgery found *  Subjective: no complaints today  Vitals:   09/25/16 0451 09/25/16 0844  BP: (!) 162/56 (!) 167/90  Pulse: 70 72  Resp: 16   Temp: 98.3 F (36.8 C)     Physical Exam: Awake and alert Non labored breathing with trach in place R foot ttp Weak monophasic pt/peroneal at right foot, left foot with pt signal  CBC    Component Value Date/Time   WBC 11.2 (H) 09/24/2016 0426   RBC 3.48 (L) 09/24/2016 0426   HGB 10.0 (L) 09/24/2016 0426   HCT 32.0 (L) 09/24/2016 0426   PLT 384 09/24/2016 0426   MCV 92.0 09/24/2016 0426   MCH 28.7 09/24/2016 0426   MCHC 31.3 09/24/2016 0426   RDW 16.6 (H) 09/24/2016 0426   LYMPHSABS 0.5 (L) 09/19/2016 2320   MONOABS 1.4 (H) 09/19/2016 2320   EOSABS 0.1 09/19/2016 2320   BASOSABS 0.0 09/19/2016 2320    BMET    Component Value Date/Time   NA 140 09/24/2016 0426   K 4.1 09/24/2016 0426   CL 100 (L) 09/24/2016 0426   CO2 30 09/24/2016 0426   GLUCOSE 127 (H) 09/24/2016 0426   BUN 19 09/24/2016 0426   CREATININE 1.37 (H) 09/24/2016 0426   CREATININE 1.59 (H) 05/27/2016 1020   CALCIUM 8.7 (L) 09/24/2016 0426   GFRNONAA 38 (L) 09/24/2016 0426   GFRAA 44 (L) 09/24/2016 0426    INR    Component Value Date/Time   INR 1.68 09/25/2016 0451     Intake/Output Summary (Last 24 hours) at 09/25/16 1000 Last data filed at 09/24/16 1700  Gross per 24 hour  Intake               50 ml  Output                0 ml  Net               50 ml     Assessment:  70 y.o. female is here with altered mental status that has resolved. inr now 1.6 with elevated Cr at baseline.   Plan: -heparin per pharmacy protocol -will plan for angiogram at soonest possible available time, likely will need C02 for aortogram -vein map today to know options   Brandon C. Randie Heinzain, MD [facility] Office: [phone] Pager: [phone]  09/25/2016 10:00 AM

## 2016-09-25 NOTE — Progress Notes (Signed)
Patient refused trach care and cleaning today. She states she knows when it needs to be cleaned and it feels just fine today. Writer advised patient to notify staff if she changes her mind. Will continue to monitor.  [name]  [name]

## 2016-09-25 NOTE — Progress Notes (Addendum)
ANTICOAGULATION CONSULT NOTE  Pharmacy Consult for warfarin to heparin Indication: DVT PTA, but now R. foot ischemia  Allergies  Allergen Reactions  . Aldactone [Spironolactone] Other (See Comments)    Severe hyperkalemia   . Lisinopril Other (See Comments) and Cough    Hypotension also  . Crestor [Rosuvastatin Calcium] Other (See Comments)    Muscle Pain  . Vicodin [Hydrocodone-Acetaminophen] Nausea And Vomiting    Patient Measurements: Height: 5\' 2"  (157.5 cm) Weight: 162 lb 3.2 oz (73.6 kg) IBW/kg (Calculated) : 50.1 Heparin Dosing Weight: 65.7 kg  Vital Signs: Temp: 97.8 F (36.6 C) (12/03 1437) Temp Source: Oral (12/03 1437) BP: 116/45 (12/03 1437) Pulse Rate: 73 (12/03 1535)  Labs:  Recent Labs  09/23/16 0345 09/24/16 0426 09/25/16 0451 09/25/16 1553  HGB  --  10.0*  --   --   HCT  --  32.0*  --   --   PLT  --  384  --   --   LABPROT 25.3* 24.7* 20.0*  --   INR 2.26 2.19 1.68  --   HEPARINUNFRC  --   --   --  0.18*  CREATININE  --  1.37*  --   --     Estimated Creatinine Clearance: 35.9 mL/min (by C-G formula based on SCr of 1.37 mg/dL (H)).   Medical History: Past Medical History:  Diagnosis Date  . Anemia   . Arthritis    "hands, back" (06/28/2016)  . BRBPR (bright red blood per rectum) 06/28/2016  . CAD (coronary artery disease)    a. S/p CABG and stenting // b. Myoview 3/16: high risk EF 25%, ant-apical AK, ant scar with peri-infarct ischemia // c. LHC 3/16 - EF 45%, inferior hypokinesis, oLAD 100, oLCx 40, branch vessel 50, pRCA 100 dRCA , RV br 90, S-RCA occluded, S-LAD patent with inf-apical LAD beyond graft 70 >> med Rx  . Carotid artery disease (HCC)    a. s/p L CEA // b. Carotid US 4/16: R 40-59%; L CEA patent with 40-59%  . Chronic combined systolic and diastolic CHF (congestive heart failure) (HCC)    a. 11/2011 Echo with EF 30-35%, global hypokinesis, and inferior akinesis;  b. 12/2014 Echo: EF 50%, mod LVH, Ao sclerosis w/o stenosis,  mildly dil LA, mild to mod RV dysfxn. // c. Echo 10/16: Moderate LVH, EF 35-40%, inferior AK, grade 1 diastolic dysfunction, MAC, mild MR, moderate LAE  . Chronic lower back pain   . CKD (chronic kidney disease), stage III   . COPD (chronic obstructive pulmonary disease) (HCC)    a. prn and HS supplemental O2.  . DDD (degenerative disc disease), lumbar   . Depression   . GERD (gastroesophageal reflux disease)   . History of blood transfusion ~ 2015; 06/28/2016   "anemia"  . History of IBS   . History of tracheostomy 08/26/2013   Dr. Jenne PaneBates  . Hyperlipidemia   . Hypertension   . Hypothyroidism    Goiter  . MI (myocardial infarction) 1997  . On home oxygen therapy    "4L trach collar at night & prn during the day" (09/20/2016)  . PAD (peripheral artery disease) (HCC)   . Stroke [facility](HCC)    MRI 11/2011 with remote occipital lobe. MRA with moderate left focal vertebral artery stenosis  . Type II diabetes mellitus (HCC)      Assessment: Pt now with possible occluded bypass grafts in right leg. Plan for arteriogram when INR down. Continuing on heparin.  Pertinent Hx:  prior left fem PT bypass several years ago. Also, apparently had right fem AK pop and then pop PT bypass elsewhere many years ago.  She has about 2 week history of rest pain right foot.  Heparin level subtherapeutic (0.18) on 1000 units/h. CBC stable. Per discussion with RN, IV line now needs to be replaced (but not an issue until after heparin level was drawn). No bleeding issues reported. RN to notify when IV replaced.  Goal of Therapy:  Heparin level 0.3-0.7 units/ml Monitor platelets by anticoagulation protocol: Yes   Plan: Increase heparin to 1200 units/h 8h heparin level Daily heparin level/CBC Monitor for s/sx bleeding Warfarin on hold   Babs BertinHaley Erikah Thumm, PharmD, BCPS Clinical Pharmacist 09/25/2016 4:40 PM   ADDENDUM:  RN notified IV replaced and heparin resumed at 1200 units/h at ~1900. Will re-time 8h heparin  level.  Babs BertinHaley Keiondre Colee, PharmD, BCPS Clinical Pharmacist 09/25/2016 7:07 PM

## 2016-09-25 NOTE — Progress Notes (Signed)
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 CBG was noted to be 28. There was also some concern for narcotic overdose. She had good response to Narcan. Patient was hospitalized for further management.  Reason for Visit: Altered mental status  Consultants: Vascular surgery  Procedures:  Right lower extremity arterial Dopplers Right ABIs indicate a severe reduction in arterial flow at rest. Unable to obtain all pressures due to venous signals overriding the arterial signals. Left ABI indicates a moderate reduction in arterial flow at rest.  Antibiotics: Vantin  Subjective/Interval History: Patient states that her right foot feels better this morning. Continues to have pain, however.   ROS: Denies chest pain or shortness of breath. No nausea or vomiting.  Objective:  Vital Signs  Vitals:   09/25/16 0003 09/25/16 0451 09/25/16 0844 09/25/16 0907  BP:  (!) 162/56 (!) 167/90   Pulse: 79 70 72   Resp: 16 16    Temp:  98.3 F (36.8 C)    TempSrc:  Oral    SpO2: 98% 100%  100%  Weight:  73.6 kg (162 lb 3.2 oz)    Height:        Intake/Output Summary (Last 24 hours) at 09/25/16 1016 Last data filed at 09/24/16 1700  Gross per 24 hour  Intake               50 ml  Output                0 ml  Net               50 ml   Filed Weights   09/23/16 0345 09/24/16 0500 09/25/16 0451  Weight: 73.3 kg (161 lb 8 oz) 73.8 kg (162 lb 9.6 oz) 73.6 kg (162 lb 3.2 oz)    General appearance: alert, cooperative, appears stated age and no  distress Tracheostomy Resp: clear to auscultation bilaterally Cardio: regular rate and rhythm, S1, S2 normal, no murmur, click, rub or gallop GI: soft, non-tender; bowel sounds normal; no masses,  no organomegaly Extremities: Right lower extremity not examined today as vascular surgery had recently seen the patient. Neurologic: Awake and alert. Oriented 3.  Lab Results:  Data Reviewed: I have personally reviewed following labs and imaging studies  CBC:  Recent Labs Lab 09/19/16 2320 09/21/16 0559 09/22/16 0412 09/24/16 0426  WBC 15.5* 12.6* 10.4 11.2*  NEUTROABS 13.5*  --   --   --   HGB 9.8* 9.2* 9.1* 10.0*  HCT 30.8* 29.4* 29.2* 32.0*  MCV 90.6 91.9 93.3 92.0  PLT 429* 392 376 384    Basic Metabolic Panel:  Recent Labs Lab 09/19/16 2320 09/20/16 0050 09/21/16 0957 09/22/16 0412 09/24/16 0426  NA 133*  --  140 139 140  K 6.1* 4.0 3.9 4.3 4.1  CL 98*  --  104 102 100*  CO2 26  --  27 28 30   GLUCOSE 84  --  167* 165* 127*  BUN 39*  --  23* 25* 19  CREATININE 1.91*  --  1.33* 1.33* 1.37*  CALCIUM 8.7*  --  8.1* 8.5* 8.7*  GFR: Estimated Creatinine Clearance: 35.9 mL/min (by C-G formula based on SCr of 1.37 mg/dL (H)).  Liver Function Tests:  Recent Labs Lab 09/19/16 2320 09/21/16 0957  AST 67* 18  ALT 24 15  ALKPHOS 66 62  BILITOT 1.9* 0.5  PROT 6.9 6.0*  ALBUMIN 2.9* 2.4*    Coagulation Profile:  Recent Labs Lab 09/21/16 0559 09/22/16 0412 09/23/16 0345 09/24/16 0426 09/25/16 0451  INR 2.05 2.10 2.26 2.19 1.68    CBG:  Recent Labs Lab 09/24/16 1657 09/24/16 2151 09/25/16 0005 09/25/16 0448 09/25/16 0832  GLUCAP 129* 231* 136* 148* 159*     Recent Results (from the past 240 hour(s))  Urine culture     Status: Abnormal   Collection Time: 09/19/16 11:21 PM  Result Value Ref Range Status   Specimen Description URINE, CATHETERIZED  Final   Special Requests NONE  Final   Culture >=100,000 COLONIES/mL KLEBSIELLA PNEUMONIAE (A)   Final   Report Status 09/22/2016 FINAL  Final   Organism ID, Bacteria KLEBSIELLA PNEUMONIAE (A)  Final      Susceptibility   Klebsiella pneumoniae - MIC*    AMPICILLIN >=32 RESISTANT Resistant     CEFAZOLIN <=4 SENSITIVE Sensitive     CEFTRIAXONE <=1 SENSITIVE Sensitive     CIPROFLOXACIN <=0.25 SENSITIVE Sensitive     GENTAMICIN <=1 SENSITIVE Sensitive     IMIPENEM <=0.25 SENSITIVE Sensitive     NITROFURANTOIN 64 INTERMEDIATE Intermediate     TRIMETH/SULFA <=20 SENSITIVE Sensitive     AMPICILLIN/SULBACTAM 4 SENSITIVE Sensitive     PIP/TAZO <=4 SENSITIVE Sensitive     Extended ESBL NEGATIVE Sensitive     * >=100,000 COLONIES/mL KLEBSIELLA PNEUMONIAE  Blood culture (routine x 2)     Status: Abnormal   Collection Time: 09/19/16 11:44 PM  Result Value Ref Range Status   Specimen Description BLOOD RIGHT ARM  Final   Special Requests BOTTLES DRAWN AEROBIC AND ANAEROBIC 5ML  Final   Culture  Setup Time   Final    GRAM POSITIVE COCCI IN PAIRS IN CHAINS ANAEROBIC BOTTLE ONLY Organism ID to follow CRITICAL RESULT CALLED TO, READ BACK BY AND VERIFIED WITH: GREG ABBOTT,PHARMD @0436  09/21/16 MKELLY,MLT    Culture (A)  Final    VIRIDANS STREPTOCOCCUS THE SIGNIFICANCE OF ISOLATING THIS ORGANISM FROM A SINGLE SET OF BLOOD CULTURES WHEN MULTIPLE SETS ARE DRAWN IS UNCERTAIN. PLEASE NOTIFY THE MICROBIOLOGY DEPARTMENT WITHIN ONE WEEK IF SPECIATION AND SENSITIVITIES ARE REQUIRED.    Report Status 09/22/2016 FINAL  Final  Blood Culture ID Panel (Reflexed)     Status: Abnormal   Collection Time: 09/19/16 11:44 PM  Result Value Ref Range Status   Enterococcus species NOT DETECTED NOT DETECTED Final   Listeria monocytogenes NOT DETECTED NOT DETECTED Final   Staphylococcus species NOT DETECTED NOT DETECTED Final   Staphylococcus aureus NOT DETECTED NOT DETECTED Final   Streptococcus species DETECTED (A) NOT DETECTED Final    Comment: CRITICAL RESULT CALLED TO, READ BACK BY AND VERIFIED WITH: GREG  ABBOTT, PHARMD @0436  09/21/16 MKELLY,MLT    Streptococcus agalactiae NOT DETECTED NOT DETECTED Final   Streptococcus pneumoniae NOT DETECTED NOT DETECTED Final   Streptococcus pyogenes NOT DETECTED NOT DETECTED Final   Acinetobacter baumannii NOT DETECTED NOT DETECTED Final   Enterobacteriaceae species NOT DETECTED NOT DETECTED Final   Enterobacter cloacae complex NOT DETECTED NOT DETECTED Final   Escherichia coli NOT DETECTED NOT DETECTED Final   Klebsiella oxytoca NOT DETECTED NOT DETECTED Final   Klebsiella pneumoniae NOT  DETECTED NOT DETECTED Final   Proteus species NOT DETECTED NOT DETECTED Final   Serratia marcescens NOT DETECTED NOT DETECTED Final   Haemophilus influenzae NOT DETECTED NOT DETECTED Final   Neisseria meningitidis NOT DETECTED NOT DETECTED Final   Pseudomonas aeruginosa NOT DETECTED NOT DETECTED Final   Candida albicans NOT DETECTED NOT DETECTED Final   Candida glabrata NOT DETECTED NOT DETECTED Final   Candida krusei NOT DETECTED NOT DETECTED Final   Candida parapsilosis NOT DETECTED NOT DETECTED Final   Candida tropicalis NOT DETECTED NOT DETECTED Final  Blood culture (routine x 2)     Status: None (Preliminary result)   Collection Time: 09/20/16 12:35 AM  Result Value Ref Range Status   Specimen Description BLOOD LEFT ARM  Final   Special Requests BOTTLES DRAWN AEROBIC AND ANAEROBIC  Final   Culture NO GROWTH 4 DAYS  Final   Report Status PENDING  Incomplete  MRSA PCR Screening     Status: None   Collection Time: 09/20/16  6:14 PM  Result Value Ref Range Status   MRSA by PCR NEGATIVE NEGATIVE Final    Comment:        The GeneXpert MRSA Assay (FDA approved for NASAL specimens only), is one component of a comprehensive MRSA colonization surveillance program. It is not intended to diagnose MRSA infection nor to guide or monitor treatment for MRSA infections.       Radiology Studies: No results found.   Medications:  Scheduled: . aspirin EC   81 mg Oral Daily  . budesonide  0.25 mg Nebulization BID  . carvedilol  12.5 mg Oral BID WC  . cefpodoxime  200 mg Oral Q12H  . dicyclomine  20 mg Oral TID AC  . DULoxetine  60 mg Oral QHS  . ferrous sulfate  325 mg Oral Q breakfast  . gabapentin  300 mg Oral QHS  . hydrALAZINE  25 mg Oral Q8H  . insulin aspart  0-15 Units Subcutaneous Q4H  . insulin glargine  5 Units Subcutaneous Daily  . isosorbide mononitrate  15 mg Oral Daily  . levothyroxine  75 mcg Oral QAC breakfast  . multivitamin with minerals  1 tablet Oral Daily  . pantoprazole  40 mg Oral Daily  . simvastatin  40 mg Oral QPM  . tiZANidine  2 mg Oral Daily  . torsemide  40 mg Oral Daily   Continuous: . heparin 1,000 Units/hr (09/25/16 1012)   ZOX:[id number], albuterol, ipratropium-albuterol, oxyCODONE-acetaminophen, polyethylene glycol, senna-docusate, traMADol  Assessment/Plan:  Principal Problem:   Toxic metabolic encephalopathy Active Problems:   Chronic combined systolic and diastolic CHF, NYHA class 2 (EF 30-35%) per ECHO 2017   Hypertension   Chronic respiratory failure with hypoxia (HCC)   Hypoglycemia   Altered mental status   Uncontrolled type 2 diabetes mellitus with complication (HCC)   Essential hypertension   Acute deep vein thrombosis (DVT) of distal end of right lower extremity (HCC)    Right foot pain/Ischemic pain Arterial Dopplers show severely limited circulation in the right. She is status post vascular surgery to that lower extremity in the past and concern is for occlusion of her old grafts. Vascular surgery Is following. Plan is for arteriogram tomorrow.    RLE DVT Warfarin discontinued due to need for angiogram of the lower extremity. Heparin to be initiated when INR is less than 2. Patient given 2.5 mg of vitamin K on 12/2. INR is now less than 2. When she can be restarted back on anticoagulation,  we could consider one of the direct acting agents. She does have CKD, however, it does  not appear to worse enough to prohibit use of the newer agents. Will discuss with pharmacy. The direct acting agents were discussed with the patient and her daughter. Benefits and risks were explained. They would like to consider this option if available.  Acute Metabolic Encephalopathy Etiology is thought to be multifactorial including hypoglycemia, opioid overdose (patient's medication administered by SNF). Patient is now back to baseline.  Diabetes type 2 uncontrolled with complications/Hypoglycemia Patient is on long acting insulin at her skilled nursing facility. She did have evidence for hypoglycemia at presentation and also in the hospital. Patient denies any recent changes to her medication dosage. HbA1c is 8.7. CBGs are now elevated. On Lantus here at a lower dose. Will need to lower her dose of Tresiba at the time of discharge.   Questionable Sepsis Patient was noted to have leukocytosis, however without fever. Urine culture is again growing Klebsiella. She grew the same bacteria on November 15. Based on discharge summary it appears that she was treated with Keflex. Sensitivities are available. On Vantin for now. Also, one out of 2 blood cultures is growing strep viridans. This is most likely a contaminant.  Bacteremia with strep viridans Only one culture is positive for now. Other remains negative. This is most likely a contaminant. Patient has not had any fevers.   Urinary tract infection with Klebsiella Continue Vantin. Will need at least 7 days of treatment.  Essential hypertension  Stable. Continue home medications.  Chronic Systolic and Diastolic CHF Compensated. Continue home medications.  Pulmonary hypertension Stable  Chronic respiratory failure (Chronic Tracheostomy) Stable, trach care  DVT Prophylaxis: On warfarin    Code Status: Full code  Family Communication: Discussed with the patient  Disposition Plan: Management as outlined above. Await vascular  investigations. Will go back to skilled nursing facility when medically ready. Daughter requesting different nursing facility.    LOS: 5 days   [facility] [place] [facility]ights  [facility] Pager [phone] 09/25/2016, 10:16 AM  If 7PM-7AM, please contact night-coverage at [email or web address], password [facility]

## 2016-09-25 NOTE — Progress Notes (Signed)
ANTICOAGULATION CONSULT NOTE - Follow Up Consult  Pharmacy Consult for heparin Indication: DVT and limb ischemia  Labs:  Recent Labs  09/23/16 0345 09/24/16 0426 09/25/16 0451  HGB  --  10.0*  --   HCT  --  32.0*  --   PLT  --  384  --   LABPROT 25.3* 24.7* 20.0*  INR 2.26 2.19 1.68  CREATININE  --  1.37*  --     Assessment: 70yo female transitioning from Coumadin to heparin, now w/ INR <2.  Goal of Therapy:  Heparin level 0.3-0.7 units/ml Monitor platelets by anticoagulation protocol: Yes   Plan:  Will begin heparin gtt at 1000 units/hr and monitor heparin levels and CBC.  Vernard GamblesVeronda Cambryn Charters, PharmD, BCPS  09/25/2016,6:49 AM

## 2016-09-26 DIAGNOSIS — I999 Unspecified disorder of circulatory system: Secondary | ICD-10-CM

## 2016-09-26 LAB — CBC
HCT: 30.4 % — ABNORMAL LOW (ref 36.0–46.0)
Hemoglobin: 9.3 g/dL — ABNORMAL LOW (ref 12.0–15.0)
MCH: 28.2 pg (ref 26.0–34.0)
MCHC: 30.6 g/dL (ref 30.0–36.0)
MCV: 92.1 fL (ref 78.0–100.0)
PLATELETS: 344 10*3/uL (ref 150–400)
RBC: 3.3 MIL/uL — ABNORMAL LOW (ref 3.87–5.11)
RDW: 16.4 % — AB (ref 11.5–15.5)
WBC: 11.6 10*3/uL — AB (ref 4.0–10.5)

## 2016-09-26 LAB — PROTIME-INR
INR: 1.5
Prothrombin Time: 18.2 seconds — ABNORMAL HIGH (ref 11.4–15.2)

## 2016-09-26 LAB — BASIC METABOLIC PANEL
ANION GAP: 8 (ref 5–15)
BUN: 37 mg/dL — ABNORMAL HIGH (ref 6–20)
CALCIUM: 8.4 mg/dL — AB (ref 8.9–10.3)
CO2: 30 mmol/L (ref 22–32)
Chloride: 98 mmol/L — ABNORMAL LOW (ref 101–111)
Creatinine, Ser: 1.72 mg/dL — ABNORMAL HIGH (ref 0.44–1.00)
GFR calc Af Amer: 34 mL/min — ABNORMAL LOW (ref >60)
GFR, EST NON AFRICAN AMERICAN: 29 mL/min — AB (ref >60)
GLUCOSE: 161 mg/dL — AB (ref 65–99)
Potassium: 4.1 mmol/L (ref 3.5–5.1)
SODIUM: 136 mmol/L (ref 135–145)

## 2016-09-26 LAB — GLUCOSE, CAPILLARY
GLUCOSE-CAPILLARY: 147 mg/dL — AB (ref 65–99)
GLUCOSE-CAPILLARY: 197 mg/dL — AB (ref 65–99)
GLUCOSE-CAPILLARY: 293 mg/dL — AB (ref 65–99)
Glucose-Capillary: 134 mg/dL — ABNORMAL HIGH (ref 65–99)
Glucose-Capillary: 167 mg/dL — ABNORMAL HIGH (ref 65–99)
Glucose-Capillary: 179 mg/dL — ABNORMAL HIGH (ref 65–99)

## 2016-09-26 LAB — HEPARIN LEVEL (UNFRACTIONATED)
Heparin Unfractionated: 0.34 IU/mL (ref 0.30–0.70)
Heparin Unfractionated: 0.32 IU/mL (ref 0.30–0.70)

## 2016-09-26 NOTE — Progress Notes (Signed)
Physical Therapy Treatment Patient Details Name: [name] MRN: [id number] DOB: 10/26/1945 Today's Date: 09/26/2016    History of Present Illness Pt is a 70 y/o female admitted secondary to AMS, toxic metabolic encephalopathy. PMH including but not limited to chronic trach dependent, CAD, CKD, COPD, PAD, CVA (2013), DM and hx of CABG.     PT Comments    The pt is making progress toward all goals, but she is limited by pain in her RLE and decreased strength and endurance.  Pt was able to ambulate a short distance [facility] to the recliner without LOB or increased pain.  Pt will benefit from further PT to address these impairments and make her as functionally independent as possible.  Continue with POC.  Follow Up Recommendations  SNF     Equipment Recommendations  None recommended by PT    Recommendations for Other Services       Precautions / Restrictions Precautions Precautions: Fall Precaution Comments: Chronic Trach Restrictions Weight Bearing Restrictions: No Other Position/Activity Restrictions: Per notes pt to f/u as outpatient for subacute R 5th phalanx fx.      Mobility  Bed Mobility Overal bed mobility: Needs Assistance Bed Mobility: Supine to Sit     Supine to sit: Supervision     General bed mobility comments: Supervision for safety; no physical assist required. HOB elevated with use of bed rails. Pt able to scoot self up in bed with use of bed rails.  Transfers Overall transfer level: Needs assistance Equipment used: 1 person hand held assist;Rolling walker (2 wheeled) Transfers: Sit to/from [employer] CorporationStand;Stand Pivot Transfers (1 person A from [facility]OB to [facility] and [facility] Sit to Stand: Min assist Stand pivot transfers: Min assist       General transfer comment: Min A to power up and verbal cues for hand placement.  Ambulation/Gait Ambulation/Gait assistance: Min assist Ambulation Distance (Feet): 14 Feet Assistive device: Rolling walker (2  wheeled) Gait Pattern/deviations: Step-through pattern;Decreased stance time - right;Decreased weight shift to right   Gait velocity interpretation: Below normal speed for age/gender General Gait Details: decreased weightbearing on RLE; limited distance due to RLE pain. mIn assist for support. quick to sit in recliner without reaching back.   Stairs            Wheelchair Mobility    Modified Rankin (Stroke Patients Only)       Balance     Sitting balance-Leahy Scale: Good       Standing balance-Leahy Scale: Fair                      Cognition Arousal/Alertness: Awake/alert Behavior During Therapy: WFL for tasks assessed/performed Overall Cognitive Status: Within Functional Limits for tasks assessed                      Exercises      General Comments        Pertinent Vitals/Pain Pain Assessment: Faces Faces Pain Scale: Hurts little more Pain Location: R foot Pain Descriptors / Indicators: Aching;Grimacing;Guarding Pain Intervention(s): Limited activity within patient's tolerance;Monitored during session;Repositioned    Home Living                      Prior Function            PT Goals (current goals can now be found in the care plan section) Acute Rehab PT Goals Patient Stated Goal: decrease R foot pain PT  Goal Formulation: With patient Time For Goal Achievement: 10/06/16 Potential to Achieve Goals: Fair Progress towards PT goals: Progressing toward goals    Frequency    Min 3X/week      PT Plan Current plan remains appropriate    Co-evaluation             End of Session Equipment Utilized During Treatment: Gait belt;Oxygen Activity Tolerance: Patient tolerated treatment well Patient left: in chair;with call bell/phone within reach;with chair alarm set     Time: [id number] PT Time Calculation (min) (ACUTE ONLY): 22 min  Charges:  $Therapeutic Activity: 8-22 mins                    G Codes:       Cathleen CortiMary Zacharie Portner 09/26/2016, 5:53 PM  Zelphia CairoMary R. Jehieli Brassell, Leda GauzeSPTA [phone]

## 2016-09-26 NOTE — Progress Notes (Signed)
Informed consent complete and placed in patient's chart. Lenord CarboAubrey  Phenix Grein 5:17 PM

## 2016-09-26 NOTE — Progress Notes (Signed)
Pt was asked twice during the shift if she wanted her trach care done. CN made aware. She refused both times. Will continue to monitor.

## 2016-09-26 NOTE — Progress Notes (Signed)
CSW left message for patient's daughter.  Osborne Cascoadia Alleene Stoy LCSWA [phone]

## 2016-09-26 NOTE — Progress Notes (Signed)
Patient expressed concern about time of arteriogram and her medications. Dr. Randie Heinzain with Vascular paged. He stated that the heparin drip can be continued, all medications can be given, and the patient can eat. He stated that the patient may have the procedure today depending on the schedule. Will continue with current care plan and medications and notify patient.  Lenord CarboAubrey  Thailyn Khalid

## 2016-09-26 NOTE — Progress Notes (Signed)
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 CBG was noted to be 28. There was also some concern for narcotic overdose. She had good response to Narcan. Patient was hospitalized for further management. Patient's mental status improved with improvement in glucose levels. Then patient started complaining of right foot pain. She has a history of peripheral vascular disease, status post bypass. Concern was about ischemia to the right lower extremity. Doppler study showed limited flow. Vascular surgery was consulted subsequently.  Reason for Visit: Altered mental status  Consultants: Vascular surgery  Procedures:  Right lower extremity arterial Dopplers Right ABIs indicate a severe reduction in arterial flow at rest. Unable to obtain all pressures due to venous signals overriding the arterial signals. Left ABI indicates a moderate reduction in arterial flow at rest.  Antibiotics: Vantin  Subjective/Interval History: Patient continues to have 8 out of 10 pain in the right foot. Denies any other complaints at this time.   ROS: Denies chest pain or shortness of breath. No nausea or vomiting.  Objective:  Vital Signs  Vitals:   09/25/16 2055 09/26/16 0158 09/26/16 0436 09/26/16 0620  BP: (!) 147/49  (!) 173/58 (!) 132/50  Pulse: 77 81 83 79  Resp: 17 19 17    Temp: 97.5 F (36.4 C)  98.2 F (36.8 C)   TempSrc: Axillary  Axillary   SpO2: 94% 93% 99%   Weight:   75.3 kg (166 lb)   Height:        Intake/Output Summary (Last 24 hours) at 09/26/16  [id number] Last data filed at 09/25/16 1500  Gross per 24 hour  Intake               48 ml  Output                0 ml  Net               48 ml   Filed Weights   09/24/16 0500 09/25/16 0451 09/26/16 0436  Weight: 73.8 kg (162 lb 9.6 oz) 73.6 kg (162 lb 3.2 oz) 75.3 kg (166 lb)    General appearance: alert, cooperative, appears stated age and no distress Tracheostomy Resp: clear to auscultation bilaterally Cardio: regular rate and rhythm, S1, S2 normal, no murmur, click, rub or gallop GI: soft, non-tender; bowel sounds normal; no masses,  no organomegaly Extremities: Right foot is cold to touch Neurologic: Awake and alert. Oriented 3.  Lab Results:  Data Reviewed: I have personally reviewed following labs and imaging studies  CBC:  Recent Labs Lab 09/19/16 2320 09/21/16 0559 09/22/16 0412 09/24/16 0426 09/26/16 0322  WBC 15.5* 12.6* 10.4 11.2* 11.6*  NEUTROABS 13.5*  --   --   --   --   HGB 9.8* 9.2* 9.1* 10.0* 9.3*  HCT 30.8* 29.4* 29.2* 32.0* 30.4*  MCV 90.6 91.9 93.3 92.0 92.1  PLT 429* 392 376 384 344    Basic Metabolic Panel:  Recent Labs Lab 09/21/16 0957 09/22/16 0412 09/24/16 0426 09/25/16 1553 09/26/16 0322  NA 140 139 140 137 136  K 3.9 4.3 4.1 4.6 4.1  CL 104 102  100* 98* 98*  CO2 27 28 30 30 30   GLUCOSE 167* 165* 127* 312* 161*  BUN 23* 25* 19 33* 37*  CREATININE 1.33* 1.33* 1.37* 1.80* 1.72*  CALCIUM 8.1* 8.5* 8.7* 8.3* 8.4*  MG  --   --   --  2.1  --     GFR: Estimated Creatinine Clearance: 28.9 mL/min (by C-G formula based on SCr of 1.72 mg/dL (H)).  Liver Function Tests:  Recent Labs Lab 09/19/16 2320 09/21/16 0957  AST 67* 18  ALT 24 15  ALKPHOS 66 62  BILITOT 1.9* 0.5  PROT 6.9 6.0*  ALBUMIN 2.9* 2.4*    Coagulation Profile:  Recent Labs Lab 09/22/16 0412 09/23/16 0345 09/24/16 0426 09/25/16 0451 09/26/16 0322  INR 2.10 2.26 2.19 1.68 1.50    CBG:  Recent Labs Lab 09/25/16 1645 09/25/16 2052 09/26/16 0033  09/26/16 0433 09/26/16 0811  GLUCAP 316* 148* 134* 167* 147*     Recent Results (from the past 240 hour(s))  Urine culture     Status: Abnormal   Collection Time: 09/19/16 11:21 PM  Result Value Ref Range Status   Specimen Description URINE, CATHETERIZED  Final   Special Requests NONE  Final   Culture >=100,000 COLONIES/mL KLEBSIELLA PNEUMONIAE (A)  Final   Report Status 09/22/2016 FINAL  Final   Organism ID, Bacteria KLEBSIELLA PNEUMONIAE (A)  Final      Susceptibility   Klebsiella pneumoniae - MIC*    AMPICILLIN >=32 RESISTANT Resistant     CEFAZOLIN <=4 SENSITIVE Sensitive     CEFTRIAXONE <=1 SENSITIVE Sensitive     CIPROFLOXACIN <=0.25 SENSITIVE Sensitive     GENTAMICIN <=1 SENSITIVE Sensitive     IMIPENEM <=0.25 SENSITIVE Sensitive     NITROFURANTOIN 64 INTERMEDIATE Intermediate     TRIMETH/SULFA <=20 SENSITIVE Sensitive     AMPICILLIN/SULBACTAM 4 SENSITIVE Sensitive     PIP/TAZO <=4 SENSITIVE Sensitive     Extended ESBL NEGATIVE Sensitive     * >=100,000 COLONIES/mL KLEBSIELLA PNEUMONIAE  Blood culture (routine x 2)     Status: Abnormal   Collection Time: 09/19/16 11:44 PM  Result Value Ref Range Status   Specimen Description BLOOD RIGHT ARM  Final   Special Requests BOTTLES DRAWN AEROBIC AND ANAEROBIC 5ML  Final   Culture  Setup Time   Final    GRAM POSITIVE COCCI IN PAIRS IN CHAINS ANAEROBIC BOTTLE ONLY Organism ID to follow CRITICAL RESULT CALLED TO, READ BACK BY AND VERIFIED WITH: GREG ABBOTT,PHARMD @0436  09/21/16 MKELLY,MLT    Culture (A)  Final    VIRIDANS STREPTOCOCCUS THE SIGNIFICANCE OF ISOLATING THIS ORGANISM FROM A SINGLE SET OF BLOOD CULTURES WHEN MULTIPLE SETS ARE DRAWN IS UNCERTAIN. PLEASE NOTIFY THE MICROBIOLOGY DEPARTMENT WITHIN ONE WEEK IF SPECIATION AND SENSITIVITIES ARE REQUIRED.    Report Status 09/22/2016 FINAL  Final  Blood Culture ID Panel (Reflexed)     Status: Abnormal   Collection Time: 09/19/16 11:44 PM  Result Value Ref Range Status    Enterococcus species NOT DETECTED NOT DETECTED Final   Listeria monocytogenes NOT DETECTED NOT DETECTED Final   Staphylococcus species NOT DETECTED NOT DETECTED Final   Staphylococcus aureus NOT DETECTED NOT DETECTED Final   Streptococcus species DETECTED (A) NOT DETECTED Final    Comment: CRITICAL RESULT CALLED TO, READ BACK BY AND VERIFIED WITH: GREG ABBOTT, PHARMD @0436  09/21/16 MKELLY,MLT    Streptococcus agalactiae NOT DETECTED NOT DETECTED Final   Streptococcus pneumoniae NOT DETECTED NOT DETECTED Final   Streptococcus pyogenes  NOT DETECTED NOT DETECTED Final   Acinetobacter baumannii NOT DETECTED NOT DETECTED Final   Enterobacteriaceae species NOT DETECTED NOT DETECTED Final   Enterobacter cloacae complex NOT DETECTED NOT DETECTED Final   Escherichia coli NOT DETECTED NOT DETECTED Final   Klebsiella oxytoca NOT DETECTED NOT DETECTED Final   Klebsiella pneumoniae NOT DETECTED NOT DETECTED Final   Proteus species NOT DETECTED NOT DETECTED Final   Serratia marcescens NOT DETECTED NOT DETECTED Final   Haemophilus influenzae NOT DETECTED NOT DETECTED Final   Neisseria meningitidis NOT DETECTED NOT DETECTED Final   Pseudomonas aeruginosa NOT DETECTED NOT DETECTED Final   Candida albicans NOT DETECTED NOT DETECTED Final   Candida glabrata NOT DETECTED NOT DETECTED Final   Candida krusei NOT DETECTED NOT DETECTED Final   Candida parapsilosis NOT DETECTED NOT DETECTED Final   Candida tropicalis NOT DETECTED NOT DETECTED Final  Blood culture (routine x 2)     Status: None   Collection Time: 09/20/16 12:35 AM  Result Value Ref Range Status   Specimen Description BLOOD LEFT ARM  Final   Special Requests BOTTLES DRAWN AEROBIC AND ANAEROBIC  Final   Culture NO GROWTH 5 DAYS  Final   Report Status 09/25/2016 FINAL  Final  MRSA PCR Screening     Status: None   Collection Time: 09/20/16  6:14 PM  Result Value Ref Range Status   MRSA by PCR NEGATIVE NEGATIVE Final    Comment:          The GeneXpert MRSA Assay (FDA approved for NASAL specimens only), is one component of a comprehensive MRSA colonization surveillance program. It is not intended to diagnose MRSA infection nor to guide or monitor treatment for MRSA infections.       Radiology Studies: No results found.   Medications:  Scheduled: . aspirin EC  81 mg Oral Daily  . budesonide  0.25 mg Nebulization BID  . carvedilol  12.5 mg Oral BID WC  . cefpodoxime  200 mg Oral Q12H  . dicyclomine  20 mg Oral TID AC  . DULoxetine  60 mg Oral QHS  . ferrous sulfate  325 mg Oral Q breakfast  . gabapentin  300 mg Oral QHS  . hydrALAZINE  25 mg Oral Q8H  . insulin aspart  0-15 Units Subcutaneous Q4H  . insulin glargine  5 Units Subcutaneous Daily  . isosorbide mononitrate  15 mg Oral Daily  . levothyroxine  75 mcg Oral QAC breakfast  . multivitamin with minerals  1 tablet Oral Daily  . pantoprazole  40 mg Oral Daily  . simvastatin  40 mg Oral QPM  . tiZANidine  2 mg Oral Daily  . torsemide  40 mg Oral Daily   Continuous: . heparin 1,200 Units/hr (09/25/16 1902)   BJY:[id number], albuterol, ipratropium-albuterol, oxyCODONE-acetaminophen, polyethylene glycol, senna-docusate, traMADol  Assessment/Plan:  Principal Problem:   Toxic metabolic encephalopathy Active Problems:   Chronic combined systolic and diastolic CHF, NYHA class 2 (EF 30-35%) per ECHO 2017   Hypertension   Chronic respiratory failure with hypoxia (HCC)   Hypoglycemia   Altered mental status   Uncontrolled type 2 diabetes mellitus with complication (HCC)   Essential hypertension   Acute deep vein thrombosis (DVT) of distal end of right lower extremity (HCC)    Right foot pain/Ischemic pain Arterial Dopplers show severely limited circulation in the right. She is status post vascular surgery to that lower extremity in the past and concern is for occlusion of her old grafts. Vascular surgery  Is following. Plan is for arteriogram  during this hospitalization.    RLE DVT Warfarin discontinued due to need for angiogram of the lower extremity. Currently on IV heparin. INR is less than 2. She had to be given 2.5 mg of vitamin K on 12/2. When she can be restarted back on anticoagulation, we could consider one of the direct acting agents. She does have CKD and we'll need to discuss this with pharmacy. The direct acting agents were discussed with the patient and her daughter. Benefits and risks were explained. They would like to consider this option if available.  Acute Metabolic Encephalopathy Etiology is thought to be multifactorial including hypoglycemia, opioid overdose (patient's medication administered by SNF). Patient is now back to baseline.  Diabetes type 2 uncontrolled with complications/Hypoglycemia Patient is on long acting insulin at her skilled nursing facility. She did have evidence for hypoglycemia at presentation and also in the hospital. Patient denies any recent changes to her medication dosage. HbA1c is 8.7. CBGs stabilized. She was started on Lantus at lower dose. CBGs are well controlled at this time. Will need to lower her dose of Tresiba at the time of discharge.   Sepsis secondary to UTI Now resolved. Patient was noted to have leukocytosis, however without fever. Urine culture is again growing Klebsiella. She grew the same bacteria on November 15. Based on discharge summary it appears that she was treated with Keflex. Sensitivities are available. On Vantin for now. Also, one out of 2 blood cultures is growing strep viridans. This is most likely a contaminant.  Bacteremia with strep viridans Only one culture is positive for now. Other remains negative. This is most likely a contaminant. Patient has not had any fevers.   Urinary tract infection with Klebsiella Continue Vantin. Will need at least 7 days of treatment. End date will be 09/27/16.  Essential hypertension  Stable. Continue home  medications.  Chronic Systolic and Diastolic CHF Compensated. Continue home medications.  Pulmonary hypertension Stable  Chronic respiratory failure (Chronic Tracheostomy) Stable, trach care  DVT Prophylaxis: On warfarin    Code Status: Full code  Family Communication: Discussed with the patient  Disposition Plan: Management as outlined above. Await vascular investigations. Will go back to skilled nursing facility when medically ready. Daughter requesting different nursing facility.    LOS: 6 days   [facility]  [facility] Pager [phone] 09/26/2016, 8:26 AM  If 7PM-7AM, please contact night-coverage at [email or web address], password [facility]

## 2016-09-26 NOTE — Progress Notes (Addendum)
ANTICOAGULATION CONSULT NOTE  Pharmacy Consult for warfarin to heparin Indication: DVT PTA, but now R. foot ischemia  Allergies  Allergen Reactions  . Aldactone [Spironolactone] Other (See Comments)    Severe hyperkalemia   . Lisinopril Other (See Comments) and Cough    Hypotension also  . Crestor [Rosuvastatin Calcium] Other (See Comments)    Muscle Pain  . Vicodin [Hydrocodone-Acetaminophen] Nausea And Vomiting    Patient Measurements: Height: 5\' 2"  (157.5 cm) Weight: 166 lb (75.3 kg) IBW/kg (Calculated) : 50.1 Heparin Dosing Weight: 65.7 kg  Vital Signs: Temp: 98.2 F (36.8 C) (12/04 0436) Temp Source: Axillary (12/04 0436) BP: 153/52 (12/04 0921) Pulse Rate: 74 (12/04 0921)  Labs:  Recent Labs  09/24/16 0426 09/25/16 0451 09/25/16 1553 09/26/16 0322 09/26/16 1045  HGB 10.0*  --   --  9.3*  --   HCT 32.0*  --   --  30.4*  --   PLT 384  --   --  344  --   LABPROT 24.7* 20.0*  --  18.2*  --   INR 2.19 1.68  --  1.50  --   HEPARINUNFRC  --   --  0.18* 0.34 0.32  CREATININE 1.37*  --  1.80* 1.72*  --     Estimated Creatinine Clearance: 28.9 mL/min (by C-G formula based on SCr of 1.72 mg/dL (H)).   Assessment: Pt now with possible occluded bypass grafts in right leg. Plan for arteriogram Wednesday. Continuing on heparin bridge. Heparin level low therapeutic x 2 (0.34>0.32) on 1200 units/h. Will increase slightly to maintain therapeutic range. CBC stable. Hgb down a bit, no bleeding noted.  Goal of Therapy:  Heparin level 0.3-0.7 units/ml Monitor platelets by anticoagulation protocol: Yes   Plan: Increase heparin slightly to 1250 units/h Check anti-Xa level daily while on heparin Continue to monitor H&H and platelets   Thank you for allowing us to participate in this patients care. Signe Coltonya C Kendrick Remigio, PharmD Pager: [phone] 09/26/2016 11:43 AM

## 2016-09-26 NOTE — Progress Notes (Signed)
ANTICOAGULATION CONSULT NOTE  Pharmacy Consult for warfarin to heparin Indication: DVT PTA, but now R. foot ischemia  Allergies  Allergen Reactions  . Aldactone [Spironolactone] Other (See Comments)    Severe hyperkalemia   . Lisinopril Other (See Comments) and Cough    Hypotension also  . Crestor [Rosuvastatin Calcium] Other (See Comments)    Muscle Pain  . Vicodin [Hydrocodone-Acetaminophen] Nausea And Vomiting    Patient Measurements: Height: 5\' 2"  (157.5 cm) Weight: 162 lb 3.2 oz (73.6 kg) IBW/kg (Calculated) : 50.1 Heparin Dosing Weight: 65.7 kg  Vital Signs: Temp: 97.5 F (36.4 C) (12/03 2055) Temp Source: Axillary (12/03 2055) BP: 147/49 (12/03 2055) Pulse Rate: 81 (12/04 0158)  Labs:  Recent Labs  09/24/16 0426 09/25/16 0451 09/25/16 1553 09/26/16 0322  HGB 10.0*  --   --  9.3*  HCT 32.0*  --   --  30.4*  PLT 384  --   --  344  LABPROT 24.7* 20.0*  --  18.2*  INR 2.19 1.68  --  1.50  HEPARINUNFRC  --   --  0.18* 0.34  CREATININE 1.37*  --  1.80* 1.72*    Estimated Creatinine Clearance: 28.6 mL/min (by C-G formula based on SCr of 1.72 mg/dL (H)).   Assessment: Pt now with possible occluded bypass grafts in right leg. Plan for arteriogram when INR down. Continuing on heparin bridge. Heparin level therapeutic (0.34) on 1200 units/h. CBC stable. Hgb down a but not bleeding noted.  Goal of Therapy:  Heparin level 0.3-0.7 units/ml Monitor platelets by anticoagulation protocol: Yes   Plan: Continue heparin at 1200 units/h F/u 6hr confirmatory heparin level  Christoper Fabianaron Meer Reindl, PharmD, BCPS Clinical pharmacist, pager [phone] 09/26/2016 4:36 AM

## 2016-09-26 NOTE — Care Management Important Message (Signed)
Important Message  Patient Details  Name: Scot JunDorothy I Kessenich MRN: [id number] Date of Birth: 11/16/1945   Medicare Important Message Given:  Yes    Charmagne Buhl Abena 09/26/2016, 11:59 AM

## 2016-09-26 NOTE — Progress Notes (Signed)
Patient had a missed beat per telemetry. Patient asymptomatic and sleeping. Dr. Rito EhrlichKrishnan notified via page. Will continue to monitor. [name]  [name]

## 2016-09-26 NOTE — Progress Notes (Signed)
  Progress Note    09/26/2016 9:42 AM * No surgery found *  Subjective:  Right leg rest pain is tolerable  Vitals:   09/26/16 0620 09/26/16 0921  BP: (!) 132/50 (!) 153/52  Pulse: 79 74  Resp:    Temp:      Physical Exam: Awake and alert Non labored breathing with trach in place R foot ttp and cooler than left Weak monophasic pt/peroneal at right foot, left foot with pt signal   CBC    Component Value Date/Time   WBC 11.6 (H) 09/26/2016 0322   RBC 3.30 (L) 09/26/2016 0322   HGB 9.3 (L) 09/26/2016 0322   HCT 30.4 (L) 09/26/2016 0322   PLT 344 09/26/2016 0322   MCV 92.1 09/26/2016 0322   MCH 28.2 09/26/2016 0322   MCHC 30.6 09/26/2016 0322   RDW 16.4 (H) 09/26/2016 0322   LYMPHSABS 0.5 (L) 09/19/2016 2320   MONOABS 1.4 (H) 09/19/2016 2320   EOSABS 0.1 09/19/2016 2320   BASOSABS 0.0 09/19/2016 2320    BMET    Component Value Date/Time   NA 136 09/26/2016 0322   K 4.1 09/26/2016 0322   CL 98 (L) 09/26/2016 0322   CO2 30 09/26/2016 0322   GLUCOSE 161 (H) 09/26/2016 0322   BUN 37 (H) 09/26/2016 0322   CREATININE 1.72 (H) 09/26/2016 0322   CREATININE 1.59 (H) 05/27/2016 1020   CALCIUM 8.4 (L) 09/26/2016 0322   GFRNONAA 29 (L) 09/26/2016 0322   GFRAA 34 (L) 09/26/2016 0322    INR    Component Value Date/Time   INR 1.50 09/26/2016 0322     Intake/Output Summary (Last 24 hours) at 09/26/16 0942 Last data filed at 09/26/16 0921  Gross per 24 hour  Intake              403 ml  Output                0 ml  Net              403 ml     Assessment:  70 y.o. female is here with altered mental status that has resolved. inr has come down  Plan: -heparin per pharmacy protocol -will plan for angiogram on Wednesday with Co2 given elevated Cr -does not have apparent vein for bypass    Perley Arthurs C. Randie Heinzain, MD [facility] Office: [phone] Pager: [phone]  09/26/2016 9:42 AM

## 2016-09-27 LAB — BASIC METABOLIC PANEL
Anion gap: 10 (ref 5–15)
BUN: 36 mg/dL — ABNORMAL HIGH (ref 6–20)
CALCIUM: 8.5 mg/dL — AB (ref 8.9–10.3)
CO2: 29 mmol/L (ref 22–32)
CREATININE: 1.51 mg/dL — AB (ref 0.44–1.00)
Chloride: 97 mmol/L — ABNORMAL LOW (ref 101–111)
GFR, EST AFRICAN AMERICAN: 39 mL/min — AB (ref >60)
GFR, EST NON AFRICAN AMERICAN: 34 mL/min — AB (ref >60)
Glucose, Bld: 222 mg/dL — ABNORMAL HIGH (ref 65–99)
Potassium: 4.2 mmol/L (ref 3.5–5.1)
SODIUM: 136 mmol/L (ref 135–145)

## 2016-09-27 LAB — CBC
HCT: 29.1 % — ABNORMAL LOW (ref 36.0–46.0)
Hemoglobin: 8.9 g/dL — ABNORMAL LOW (ref 12.0–15.0)
MCH: 28.3 pg (ref 26.0–34.0)
MCHC: 30.6 g/dL (ref 30.0–36.0)
MCV: 92.7 fL (ref 78.0–100.0)
PLATELETS: 331 10*3/uL (ref 150–400)
RBC: 3.14 MIL/uL — ABNORMAL LOW (ref 3.87–5.11)
RDW: 17.2 % — AB (ref 11.5–15.5)
WBC: 11.6 10*3/uL — AB (ref 4.0–10.5)

## 2016-09-27 LAB — GLUCOSE, CAPILLARY
GLUCOSE-CAPILLARY: 193 mg/dL — AB (ref 65–99)
Glucose-Capillary: 111 mg/dL — ABNORMAL HIGH (ref 65–99)
Glucose-Capillary: 144 mg/dL — ABNORMAL HIGH (ref 65–99)
Glucose-Capillary: 151 mg/dL — ABNORMAL HIGH (ref 65–99)
Glucose-Capillary: 151 mg/dL — ABNORMAL HIGH (ref 65–99)
Glucose-Capillary: 220 mg/dL — ABNORMAL HIGH (ref 65–99)
Glucose-Capillary: 235 mg/dL — ABNORMAL HIGH (ref 65–99)

## 2016-09-27 LAB — PROTIME-INR
INR: 1.43
PROTHROMBIN TIME: 17.5 s — AB (ref 11.4–15.2)

## 2016-09-27 LAB — HEPARIN LEVEL (UNFRACTIONATED)
HEPARIN UNFRACTIONATED: 0.25 [IU]/mL — AB (ref 0.30–0.70)
HEPARIN UNFRACTIONATED: 0.55 [IU]/mL (ref 0.30–0.70)

## 2016-09-27 NOTE — Progress Notes (Signed)
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 CBG was noted to be 28. There was also some concern for narcotic overdose. She had good response to Narcan. Patient was hospitalized for further management. Patient's mental status improved with improvement in glucose levels. Then patient started complaining of right foot pain. She has a history of peripheral vascular disease, status post bypass. Concern was about ischemia to the right lower extremity. Doppler study showed limited flow. Vascular surgery was consulted subsequently.  Reason for Visit: Altered mental status  Consultants: Vascular surgery  Procedures:  Right lower extremity arterial Dopplers Right ABIs indicate a severe reduction in arterial flow at rest. Unable to obtain all pressures due to venous signals overriding the arterial signals. Left ABI indicates a moderate reduction in arterial flow at rest.  Antibiotics: Vantin  Subjective/Interval History: Patient continues to have 8 out of 10 pain in the right foot. Denies any other complaints at this time. Waiting for her procedure, which is to be done tomorrow   ROS: Denies chest pain or shortness of breath. No nausea or vomiting.  Objective:  Vital Signs  Vitals:   09/27/16 0352 09/27/16 0400 09/27/16 0856 09/27/16 0917  BP:  (!) 142/62 (!) 149/53   Pulse: 89 81 79 80  Resp: 18 18  18   Temp:  98.8 F (37.1 C)    TempSrc:  Oral    SpO2: 98% 98%  100%  Weight:  74.2 kg (163 lb 8 oz)    Height:       No intake or output data in  the 24 hours ending 09/27/16 0928 Filed Weights   09/25/16 0451 09/26/16 0436 09/27/16 0400  Weight: 73.6 kg (162 lb 3.2 oz) 75.3 kg (166 lb) 74.2 kg (163 lb 8 oz)    General appearance: alert, cooperative, appears stated age and no distress Tracheostomy Resp: clear to auscultation bilaterally Cardio: regular rate and rhythm, S1, S2 normal, no murmur, click, rub or gallop GI: soft, non-tender; bowel sounds normal; no masses,  no organomegaly Extremities: Right foot is cold to touch Neurologic: Awake and alert. Oriented 3.  Lab Results:  Data Reviewed: I have personally reviewed following labs and imaging studies  CBC:  Recent Labs Lab 09/21/16 0559 09/22/16 0412 09/24/16 0426 09/26/16 0322 09/27/16 0509  WBC 12.6* 10.4 11.2* 11.6* 11.6*  HGB 9.2* 9.1* 10.0* 9.3* 8.9*  HCT 29.4* 29.2* 32.0* 30.4* 29.1*  MCV 91.9 93.3 92.0 92.1 92.7  PLT 392 376 384 344 331    Basic Metabolic Panel:  Recent Labs Lab 09/22/16 0412 09/24/16 0426 09/25/16 1553 09/26/16 0322 09/27/16 0509  NA 139 140 137 136 136  K 4.3 4.1 4.6 4.1 4.2  CL 102 100* 98* 98* 97*  CO2 28 30 30 30 29   GLUCOSE 165* 127* 312* 161* 222*  BUN 25* 19 33* 37* 36*  CREATININE 1.33* 1.37* 1.80* 1.72* 1.51*  CALCIUM 8.5* 8.7* 8.3* 8.4* 8.5*  MG  --   --  2.1  --   --     GFR: Estimated Creatinine Clearance: 32.7 mL/min (by C-G formula based on SCr of 1.51 mg/dL (H)).  Liver Function Tests:  Recent Labs Lab 09/21/16 0957  AST 18  ALT 15  ALKPHOS 62  BILITOT 0.5  PROT 6.0*  ALBUMIN 2.4*    Coagulation Profile:  Recent Labs Lab 09/23/16 0345 09/24/16 0426 09/25/16 0451 09/26/16 0322 09/27/16 0509  INR 2.26 2.19 1.68 1.50 1.43    CBG:  Recent Labs Lab 09/26/16 1659 09/26/16 2027 09/27/16 0017 09/27/16 0347 09/27/16 0751  GLUCAP 293* 179* 144* 235* 151*     Recent Results (from the past 240 hour(s))  Urine culture     Status: Abnormal   Collection Time: 09/19/16 11:21 PM    Result Value Ref Range Status   Specimen Description URINE, CATHETERIZED  Final   Special Requests NONE  Final   Culture >=100,000 COLONIES/mL KLEBSIELLA PNEUMONIAE (A)  Final   Report Status 09/22/2016 FINAL  Final   Organism ID, Bacteria KLEBSIELLA PNEUMONIAE (A)  Final      Susceptibility   Klebsiella pneumoniae - MIC*    AMPICILLIN >=32 RESISTANT Resistant     CEFAZOLIN <=4 SENSITIVE Sensitive     CEFTRIAXONE <=1 SENSITIVE Sensitive     CIPROFLOXACIN <=0.25 SENSITIVE Sensitive     GENTAMICIN <=1 SENSITIVE Sensitive     IMIPENEM <=0.25 SENSITIVE Sensitive     NITROFURANTOIN 64 INTERMEDIATE Intermediate     TRIMETH/SULFA <=20 SENSITIVE Sensitive     AMPICILLIN/SULBACTAM 4 SENSITIVE Sensitive     PIP/TAZO <=4 SENSITIVE Sensitive     Extended ESBL NEGATIVE Sensitive     * >=100,000 COLONIES/mL KLEBSIELLA PNEUMONIAE  Blood culture (routine x 2)     Status: Abnormal   Collection Time: 09/19/16 11:44 PM  Result Value Ref Range Status   Specimen Description BLOOD RIGHT ARM  Final   Special Requests BOTTLES DRAWN AEROBIC AND ANAEROBIC 5ML  Final   Culture  Setup Time   Final    GRAM POSITIVE COCCI IN PAIRS IN CHAINS ANAEROBIC BOTTLE ONLY Organism ID to follow CRITICAL RESULT CALLED TO, READ BACK BY AND VERIFIED WITH: GREG ABBOTT,PHARMD @0436  09/21/16 MKELLY,MLT    Culture (A)  Final    VIRIDANS STREPTOCOCCUS THE SIGNIFICANCE OF ISOLATING THIS ORGANISM FROM A SINGLE SET OF BLOOD CULTURES WHEN MULTIPLE SETS ARE DRAWN IS UNCERTAIN. PLEASE NOTIFY THE MICROBIOLOGY DEPARTMENT WITHIN ONE WEEK IF SPECIATION AND SENSITIVITIES ARE REQUIRED.    Report Status 09/22/2016 FINAL  Final  Blood Culture ID Panel (Reflexed)     Status: Abnormal   Collection Time: 09/19/16 11:44 PM  Result Value Ref Range Status   Enterococcus species NOT DETECTED NOT DETECTED Final   Listeria monocytogenes NOT DETECTED NOT DETECTED Final   Staphylococcus species NOT DETECTED NOT DETECTED Final   Staphylococcus  aureus NOT DETECTED NOT DETECTED Final   Streptococcus species DETECTED (A) NOT DETECTED Final    Comment: CRITICAL RESULT CALLED TO, READ BACK BY AND VERIFIED WITH: GREG ABBOTT, PHARMD @0436  09/21/16 MKELLY,MLT    Streptococcus agalactiae NOT DETECTED NOT DETECTED Final   Streptococcus pneumoniae NOT DETECTED NOT DETECTED Final   Streptococcus pyogenes NOT DETECTED NOT DETECTED Final   Acinetobacter baumannii NOT DETECTED NOT DETECTED Final   Enterobacteriaceae species NOT DETECTED NOT DETECTED Final   Enterobacter cloacae complex NOT DETECTED NOT DETECTED Final   Escherichia coli NOT DETECTED NOT DETECTED Final   Klebsiella oxytoca NOT DETECTED NOT DETECTED Final   Klebsiella pneumoniae NOT DETECTED NOT DETECTED Final   Proteus species NOT DETECTED NOT DETECTED Final   Serratia marcescens NOT DETECTED NOT DETECTED Final  Haemophilus influenzae NOT DETECTED NOT DETECTED Final   Neisseria meningitidis NOT DETECTED NOT DETECTED Final   Pseudomonas aeruginosa NOT DETECTED NOT DETECTED Final   Candida albicans NOT DETECTED NOT DETECTED Final   Candida glabrata NOT DETECTED NOT DETECTED Final   Candida krusei NOT DETECTED NOT DETECTED Final   Candida parapsilosis NOT DETECTED NOT DETECTED Final   Candida tropicalis NOT DETECTED NOT DETECTED Final  Blood culture (routine x 2)     Status: None   Collection Time: 09/20/16 12:35 AM  Result Value Ref Range Status   Specimen Description BLOOD LEFT ARM  Final   Special Requests BOTTLES DRAWN AEROBIC AND ANAEROBIC  Final   Culture NO GROWTH 5 DAYS  Final   Report Status 09/25/2016 FINAL  Final  MRSA PCR Screening     Status: None   Collection Time: 09/20/16  6:14 PM  Result Value Ref Range Status   MRSA by PCR NEGATIVE NEGATIVE Final    Comment:        The GeneXpert MRSA Assay (FDA approved for NASAL specimens only), is one component of a comprehensive MRSA colonization surveillance program. It is not intended to diagnose  MRSA infection nor to guide or monitor treatment for MRSA infections.       Radiology Studies: No results found.   Medications:  Scheduled: . aspirin EC  81 mg Oral Daily  . budesonide  0.25 mg Nebulization BID  . carvedilol  12.5 mg Oral BID WC  . cefpodoxime  200 mg Oral Q12H  . dicyclomine  20 mg Oral TID AC  . DULoxetine  60 mg Oral QHS  . ferrous sulfate  325 mg Oral Q breakfast  . gabapentin  300 mg Oral QHS  . hydrALAZINE  25 mg Oral Q8H  . insulin aspart  0-15 Units Subcutaneous Q4H  . insulin glargine  5 Units Subcutaneous Daily  . isosorbide mononitrate  15 mg Oral Daily  . levothyroxine  75 mcg Oral QAC breakfast  . multivitamin with minerals  1 tablet Oral Daily  . pantoprazole  40 mg Oral Daily  . simvastatin  40 mg Oral QPM  . tiZANidine  2 mg Oral Daily  . torsemide  40 mg Oral Daily   Continuous: . heparin 1,400 Units/hr (09/27/16 0906)   ZOX:[id number], albuterol, ipratropium-albuterol, oxyCODONE-acetaminophen, polyethylene glycol, senna-docusate, traMADol  Assessment/Plan:  Principal Problem:   Toxic metabolic encephalopathy Active Problems:   Chronic combined systolic and diastolic CHF, NYHA class 2 (EF 30-35%) per ECHO 2017   Hypertension   Chronic respiratory failure with hypoxia (HCC)   Hypoglycemia   Altered mental status   Uncontrolled type 2 diabetes mellitus with complication (HCC)   Essential hypertension   Acute deep vein thrombosis (DVT) of distal end of right lower extremity (HCC)    Right foot pain/Ischemic pain Arterial Dopplers show severely limited circulation in the right. She is status post vascular surgery to that lower extremity in the past and concern is for occlusion of her old grafts. Vascular surgery Is following. Plan is for arteriogram during this hospitalization. On schedule for tomorrow.  RLE DVT Warfarin discontinued due to need for angiogram of the lower extremity. Currently on IV heparin. INR is less than 2.  She had to be given 2.5 mg of vitamin K on 12/2. When she can be restarted back on anticoagulation, we could consider one of the direct acting agents. She does have CKD and we'll need to discuss this with pharmacy. The direct acting  agents were discussed with the patient and her daughter. Benefits and risks were explained. They would like to consider this option if available.  Acute Metabolic Encephalopathy Etiology is thought to be multifactorial including hypoglycemia, opioid overdose (patient's medication administered by SNF). Patient is now back to baseline.  Diabetes type 2 uncontrolled with complications/Hypoglycemia Patient is on long acting insulin at her skilled nursing facility. She did have evidence for hypoglycemia at presentation and also in the hospital. Patient denies any recent changes to her medication dosage. HbA1c is 8.7. CBGs stabilized. She was started on Lantus at lower dose. CBGs are well controlled at this time. Will need to lower her dose of Tresiba at the time of discharge.   Sepsis secondary to UTI Now resolved. Patient was noted to have leukocytosis, however without fever. Urine culture is again growing Klebsiella. She grew the same bacteria on November 15. Based on discharge summary it appears that she was treated with Keflex. Sensitivities are available. On Vantin for now. Also, one out of 2 blood cultures is growing strep viridans. This is most likely a contaminant.  Bacteremia with strep viridans Only one culture is positive for now. Other remains negative. This is most likely a contaminant. Patient has not had any fevers.   Urinary tract infection with Klebsiella Continue Vantin. Will need at least 7 days of treatment. End date will be 09/27/16.  Essential hypertension  Stable. Continue home medications.  Chronic Systolic and Diastolic CHF Compensated. Continue home medications.  Pulmonary hypertension Stable  Chronic respiratory failure (Chronic  Tracheostomy) Stable, trach care  One Episode of missed beat overnight Patient was asymptomatic. Electrolytes are within normal range. Continue to monitor.  DVT Prophylaxis: On warfarin    Code Status: Full code  Family Communication: Discussed with the patient  Disposition Plan: Management as outlined above. Await vascular investigations. Will go back to skilled nursing facility when medically ready. Daughter requesting different nursing facility.    LOS: 7 days   [facility] IncKRISHNAN,Jayshun Galentine  [facility] Pager [phone] 09/27/2016, 9:28 AM  If 7PM-7AM, please contact night-coverage at [email or web address], password [email or web address]

## 2016-09-27 NOTE — Progress Notes (Signed)
  Progress Note    09/27/2016 9:46 AM * No surgery date entered *  Subjective:  Stable right foot pain  Vitals:   09/27/16 0856 09/27/16 0917  BP: (!) 149/53   Pulse: 79 80  Resp:  18  Temp:      Physical Exam: Awake and alert Non labored breathing with trach in place R foot ttp and cooler than left   CBC    Component Value Date/Time   WBC 11.6 (H) 09/27/2016 0509   RBC 3.14 (L) 09/27/2016 0509   HGB 8.9 (L) 09/27/2016 0509   HCT 29.1 (L) 09/27/2016 0509   PLT 331 09/27/2016 0509   MCV 92.7 09/27/2016 0509   MCH 28.3 09/27/2016 0509   MCHC 30.6 09/27/2016 0509   RDW 17.2 (H) 09/27/2016 0509   LYMPHSABS 0.5 (L) 09/19/2016 2320   MONOABS 1.4 (H) 09/19/2016 2320   EOSABS 0.1 09/19/2016 2320   BASOSABS 0.0 09/19/2016 2320    BMET    Component Value Date/Time   NA 136 09/27/2016 0509   K 4.2 09/27/2016 0509   CL 97 (L) 09/27/2016 0509   CO2 29 09/27/2016 0509   GLUCOSE 222 (H) 09/27/2016 0509   BUN 36 (H) 09/27/2016 0509   CREATININE 1.51 (H) 09/27/2016 0509   CREATININE 1.59 (H) 05/27/2016 1020   CALCIUM 8.5 (L) 09/27/2016 0509   GFRNONAA 34 (L) 09/27/2016 0509   GFRAA 39 (L) 09/27/2016 0509    INR    Component Value Date/Time   INR 1.43 09/27/2016 0509    No intake or output data in the 24 hours ending 09/27/16 0946   Assessment: 70 y.o.femaleis here with altered mental status that has resolved. inr has come down  Plan: -heparin per pharmacy protocol -will plan for angiogram with tomorrow, will need co2 -does not have vein for bypass    Grayer Sproles C. Randie Heinzain, MD [facility] [place] Office: [phone] Pager: [phone]  09/27/2016 9:46 AM

## 2016-09-27 NOTE — Progress Notes (Signed)
ANTICOAGULATION CONSULT NOTE  Pharmacy Consult for warfarin to heparin Indication: DVT PTA, but now R. foot ischemia  Allergies  Allergen Reactions  . Aldactone [Spironolactone] Other (See Comments)    Severe hyperkalemia   . Lisinopril Other (See Comments) and Cough    Hypotension also  . Crestor [Rosuvastatin Calcium] Other (See Comments)    Muscle Pain  . Vicodin [Hydrocodone-Acetaminophen] Nausea And Vomiting    Patient Measurements: Height: 5\' 2"  (157.5 cm) Weight: 163 lb 8 oz (74.2 kg) IBW/kg (Calculated) : 50.1 Heparin Dosing Weight: 65.7 kg  Vital Signs: Temp: 98.8 F (37.1 C) (12/05 0400) Temp Source: Oral (12/05 0400) BP: 142/62 (12/05 0400) Pulse Rate: 81 (12/05 0400)  Labs:  Recent Labs  09/25/16 0451  09/25/16 1553 09/26/16 0322 09/26/16 1045 09/27/16 0509  HGB  --   --   --  9.3*  --  8.9*  HCT  --   --   --  30.4*  --  29.1*  PLT  --   --   --  344  --  331  LABPROT 20.0*  --   --  18.2*  --  17.5*  INR 1.68  --   --  1.50  --  1.43  HEPARINUNFRC  --   < > 0.18* 0.34 0.32 0.25*  CREATININE  --   --  1.80* 1.72*  --  1.51*  < > = values in this interval not displayed.  Estimated Creatinine Clearance: 32.7 mL/min (by C-G formula based on SCr of 1.51 mg/dL (H)).   Assessment: Pt now with possible occluded bypass grafts in right leg. Plan for arteriogram Wednesday. Continuing on heparin bridge. Heparin level subtherapeutic at 0.25 on 1250 units/h. CBC stable. Hgb down a bit, no bleeding noted.  Goal of Therapy:  Heparin level 0.3-0.7 units/ml Monitor platelets by anticoagulation protocol: Yes   Plan: Increase heparin to 1400 units/h Check anti-Xa level in 8 hours and daily while on heparin Continue to monitor H&H and platelets   Thank you for allowing us to participate in this patients care. Signe Coltonya C Tristyn Pharris, PharmD Pager: [phone] 09/27/2016 8:10 AM

## 2016-09-27 NOTE — Progress Notes (Signed)
Writer notified by telemetry that patient had missed beats that appeared to be second degree type one. Writer in room with patient at time of notification. Patient eating lunch and asymptomatic. Dr. Rito EhrlichKrishnan notified via page. Will continue to monitor. [name]  [name] 1:29 PM

## 2016-09-27 NOTE — Progress Notes (Signed)
ANTICOAGULATION CONSULT NOTE  Pharmacy Consult for Heparin (Coumadin on hold) Indication: DVT PTA, but now R. foot ischemia  Patient Measurements: Height: 5\' 2"  (157.5 cm) Weight: 163 lb 8 oz (74.2 kg) IBW/kg (Calculated) : 50.1 Heparin Dosing Weight: 66 kg  Labs:  Recent Labs  09/25/16 0451  09/25/16 1553 09/26/16 0322 09/26/16 1045 09/27/16 0509 09/27/16 1627  HGB  --   --   --  9.3*  --  8.9*  --   HCT  --   --   --  30.4*  --  29.1*  --   PLT  --   --   --  344  --  331  --   LABPROT 20.0*  --   --  18.2*  --  17.5*  --   INR 1.68  --   --  1.50  --  1.43  --   HEPARINUNFRC  --   < > 0.18* 0.34 0.32 0.25* 0.55  CREATININE  --   --  1.80* 1.72*  --  1.51*  --   < > = values in this interval not displayed.  Estimated Creatinine Clearance: 32.7 mL/min (by C-G formula based on SCr of 1.51 mg/dL (H)).   Assessment:  70 yr old female on Coumadin prior to admission for hx DVT (09/02/16). Now with possible occluded bypass grafts in right leg. For angiogram on 09/28/16.   Coumadin on hold. Last Coumadin dose 11/30.  Received Vitamin K 2.5 mg PO on 09/24/16. INR down to 1.43.  Hx GI bleeding (06/2016), presumed diverticular.  Heparin level is now therapeutic (0.55) on 1400 units/hr, after increase in rate earlier today with low level (0.25) on 1250 units/hr.  Goal of Therapy:  Heparin level 0.3-0.7 units/ml Monitor platelets by anticoagulation protocol: Yes   Plan:  Continue heparin drip at 1400 units/hr.  Daily heparin level and CBC while on heparin.  Will follow up for anticoagulation plans post-procedure.   Dennie Fettersgan, Galileo Colello Donovan, [place] Pager: [phone] 09/27/2016 6:54 PM

## 2016-09-28 ENCOUNTER — Encounter (HOSPITAL_COMMUNITY)
Admission: EM | Disposition: A | Discharge status: Skilled Nursing Facility | Payer: Self-pay | Source: Home / Self Care | Attending: Internal Medicine

## 2016-09-28 HISTORY — PX: PERIPHERAL VASCULAR CATHETERIZATION: SHX172C

## 2016-09-28 LAB — PROTIME-INR
INR: 1.28
Prothrombin Time: 16.1 seconds — ABNORMAL HIGH (ref 11.4–15.2)

## 2016-09-28 LAB — GLUCOSE, CAPILLARY
GLUCOSE-CAPILLARY: 152 mg/dL — AB (ref 65–99)
GLUCOSE-CAPILLARY: 98 mg/dL (ref 65–99)
GLUCOSE-CAPILLARY: 111 mg/dL — AB (ref 65–99)
Glucose-Capillary: 158 mg/dL — ABNORMAL HIGH (ref 65–99)
Glucose-Capillary: 174 mg/dL — ABNORMAL HIGH (ref 65–99)

## 2016-09-28 LAB — CBC
HEMATOCRIT: 29.7 % — AB (ref 36.0–46.0)
HEMOGLOBIN: 9.1 g/dL — AB (ref 12.0–15.0)
MCH: 28.1 pg (ref 26.0–34.0)
MCHC: 30.6 g/dL (ref 30.0–36.0)
MCV: 91.7 fL (ref 78.0–100.0)
PLATELETS: 355 10*3/uL (ref 150–400)
RBC: 3.24 MIL/uL — AB (ref 3.87–5.11)
RDW: 16.8 % — ABNORMAL HIGH (ref 11.5–15.5)
WBC: 11.3 10*3/uL — AB (ref 4.0–10.5)

## 2016-09-28 LAB — BASIC METABOLIC PANEL
ANION GAP: 12 (ref 5–15)
BUN: 37 mg/dL — ABNORMAL HIGH (ref 6–20)
CHLORIDE: 97 mmol/L — AB (ref 101–111)
CO2: 29 mmol/L (ref 22–32)
Calcium: 8.8 mg/dL — ABNORMAL LOW (ref 8.9–10.3)
Creatinine, Ser: 1.43 mg/dL — ABNORMAL HIGH (ref 0.44–1.00)
GFR calc non Af Amer: 36 mL/min — ABNORMAL LOW (ref >60)
GFR, EST AFRICAN AMERICAN: 42 mL/min — AB (ref >60)
Glucose, Bld: 141 mg/dL — ABNORMAL HIGH (ref 65–99)
POTASSIUM: 4.5 mmol/L (ref 3.5–5.1)
SODIUM: 138 mmol/L (ref 135–145)

## 2016-09-28 LAB — HEPARIN LEVEL (UNFRACTIONATED)
Heparin Unfractionated: 0.57 IU/mL (ref 0.30–0.70)
Heparin Unfractionated: <0.1 IU/mL — ABNORMAL LOW (ref 0.30–0.70)

## 2016-09-28 LAB — POCT ACTIVATED CLOTTING TIME
ACTIVATED CLOTTING TIME: 197 s
ACTIVATED CLOTTING TIME: 230 s
Activated Clotting Time: 191 seconds
Activated Clotting Time: 180 seconds

## 2016-09-28 SURGERY — ABDOMINAL AORTOGRAM W/LOWER EXTREMITY

## 2016-09-28 MED ORDER — HEPARIN (PORCINE) IN NACL 2-0.9 UNIT/ML-% IJ SOLN
INTRAMUSCULAR | Status: DC | PRN
  Administered 2016-09-28: 1000 mL via INTRA_ARTERIAL

## 2016-09-28 MED ORDER — MIDAZOLAM HCL 2 MG/2ML IJ SOLN
INTRAMUSCULAR | Status: AC
  Filled 2016-09-28: qty 2

## 2016-09-28 MED ORDER — HEPARIN SODIUM (PORCINE) 1000 UNIT/ML IJ SOLN
INTRAMUSCULAR | Status: DC | PRN
  Administered 2016-09-28 (×2): 3000 [IU] via INTRAVENOUS

## 2016-09-28 MED ORDER — ONDANSETRON HCL 4 MG/2ML IJ SOLN: 4.0000 mg | Freq: Four times a day (QID) | INTRAMUSCULAR | Status: DC | PRN

## 2016-09-28 MED ORDER — MORPHINE SULFATE (PF) 4 MG/ML IV SOLN
2.0000 mg | Freq: Once | INTRAVENOUS | Status: AC
  Administered 2016-09-28: 2 mg via INTRAVENOUS

## 2016-09-28 MED ORDER — HEPARIN (PORCINE) IN NACL 2-0.9 UNIT/ML-% IJ SOLN
INTRAMUSCULAR | Status: AC
Start: 2016-09-28 — End: 2016-09-28
  Filled 2016-09-28: qty 1000

## 2016-09-28 MED ORDER — SODIUM CHLORIDE 0.9 % IV SOLN: 1.0000 mL/kg/h | INTRAVENOUS | Status: AC

## 2016-09-28 MED ORDER — MIDAZOLAM HCL 2 MG/2ML IJ SOLN
INTRAMUSCULAR | Status: DC | PRN
  Administered 2016-09-28: 0.5 mg via INTRAVENOUS

## 2016-09-28 MED ORDER — FENTANYL CITRATE (PF) 100 MCG/2ML IJ SOLN
INTRAMUSCULAR | Status: DC | PRN
  Administered 2016-09-28: 50 ug via INTRAVENOUS

## 2016-09-28 MED ORDER — LIDOCAINE HCL (PF) 1 % IJ SOLN
INTRAMUSCULAR | Status: AC
  Filled 2016-09-28: qty 30

## 2016-09-28 MED ORDER — IODIXANOL 320 MG/ML IV SOLN
INTRAVENOUS | Status: DC | PRN
  Administered 2016-09-28: 170 mL via INTRA_ARTERIAL

## 2016-09-28 MED ORDER — HEPARIN (PORCINE) IN NACL 100-0.45 UNIT/ML-% IJ SOLN
1400.0000 [IU]/h | INTRAMUSCULAR | Status: DC
  Administered 2016-09-28 – 2016-09-30 (×3): 1400 [IU]/h via INTRAVENOUS
  Filled 2016-09-28: qty 250
  Filled 2016-09-28: qty 3500
  Filled 2016-09-28: qty 250

## 2016-09-28 MED ORDER — FENTANYL CITRATE (PF) 100 MCG/2ML IJ SOLN
INTRAMUSCULAR | Status: AC
  Filled 2016-09-28: qty 2

## 2016-09-28 MED ORDER — LIDOCAINE HCL (PF) 1 % IJ SOLN
INTRAMUSCULAR | Status: DC | PRN
  Administered 2016-09-28: 12 mL via SUBCUTANEOUS

## 2016-09-28 MED ORDER — MORPHINE SULFATE (PF) 4 MG/ML IV SOLN
INTRAVENOUS | Status: AC
  Filled 2016-09-28: qty 1

## 2016-09-28 MED ORDER — SODIUM CHLORIDE 0.9 % IV SOLN: INTRAVENOUS | Status: DC

## 2016-09-28 SURGICAL SUPPLY — 20 items
BALLN MUSTANG 6.0X40 75 (BALLOONS) ×3
BALLN MUSTANG 6.0X40 75CM (BALLOONS) ×1
BALLOON MUSTANG 6.0X40 75 (BALLOONS) ×2 IMPLANT
CATH OMNI FLUSH 5F 65CM (CATHETERS) ×4 IMPLANT
CATH SOFT-VU 4F 65 STRAIGHT (CATHETERS) ×2 IMPLANT
CATH SOFT-VU STRAIGHT 4F 65CM (CATHETERS) ×2
COVER PRB 48X5XTLSCP FOLD TPE (BAG) ×2 IMPLANT
COVER PROBE 5X48 (BAG) ×2
KIT ENCORE 26 ADVANTAGE (KITS) ×4 IMPLANT
KIT MICROINTRODUCER STIFF 5F (SHEATH) ×4 IMPLANT
KIT PV (KITS) ×4 IMPLANT
SHEATH BRITE TIP 6FR 35CM (SHEATH) ×4 IMPLANT
SHEATH PINNACLE 5F 10CM (SHEATH) ×4 IMPLANT
SHEATH PINNACLE 6F 10CM (SHEATH) ×4 IMPLANT
STENT INNOVA 7X40X130 (Permanent Stent) ×4 IMPLANT
SYR MEDRAD MARK V 150ML (SYRINGE) ×4 IMPLANT
TRANSDUCER W/STOPCOCK (MISCELLANEOUS) ×4 IMPLANT
TRAY PV CATH (CUSTOM PROCEDURE TRAY) ×4 IMPLANT
WIRE AMPLATZ SS-J .035X180CM (WIRE) ×4 IMPLANT
WIRE BENTSON .035X145CM (WIRE) ×4 IMPLANT

## 2016-09-28 NOTE — Progress Notes (Signed)
PT Cancellation Note  Patient Details Name: [name] MRN: [id number] DOB: 01/14/1946   Cancelled Treatment:    Reason Eval/Treat Not Completed: Patient at procedure or test/unavailable  Pt off unit for surgery: 1.  US guided cannulation of left common femoral artery 2.  Aortogram with RLE runoff 3.  Stent of left external iliac artery with 7 x 40mm self expanding stent  Will defer PT at this time.    Kelsay Haggard Artis DelayJ Arthuro Canelo 09/28/2016, 12:17 PM  Joycelyn RuaAimee Montey Ebel, PTA pager [phone]

## 2016-09-28 NOTE — Care Management Note (Signed)
Case Management Note  Patient Details  Name: [name] MRN: [id number] Date of Birth: 05/08/1946  Subjective/Objective:                    Action/Plan:   Expected Discharge Date:                  Expected Discharge Plan:  Skilled Nursing Facility  In-House Referral:  Clinical Social Work  Discharge planning Services  CM Consult  Post Acute Care Choice:    Choice offered to:     DME Arranged:    DME Agency:     HH Arranged:    HH Agency:     Status of Service:  Completed, signed off  If discussed at [employer] Meetings, dates discussed:    Additional Comments:  Lavonna MonarchSuzi Alyaan Budzynski 09/28/2016, 8:25 PM

## 2016-09-28 NOTE — Consult Note (Signed)
[facility] [place]  Subjective  - sleepy from procedure, no foot pain   Objective (!) 141/58 73 98.6 F (37 C) (Oral) 17 99% No intake or output data in the 24 hours ending 09/28/16 1552  2+ left PT pulse Right foot slightly cooler than left  Assessment/Planning: Angiogram reviewed.  Very limited options at this point with multiple prior procedures.  She has a high grade right external iliac stenosis, right SFA popliteal AT PT occlusion with 1 vessel peroneal runoff.  Anatomically she could get a right femoral endarterectomy and right fem peroneal bypass but this will be of limited durability, maybe 6 months, due to no vein conduit and need to use PTFE.  Pt drowsy from procedure this afternoon so spoke with daughter.    I would offer patient close observation with follow up appt with me in 2-3 weeks or if she has continued worsening rest pain in right foot or unable to walk due to pain in right foot can proceed with bypass next week most likely 12/13.    I will follow up with pt again tomorrow morning for further discussions.  Ok to restart coumadin from my standpoint.  Fabienne BrunsFields, Latajah Thuman 09/28/2016 3:52 PM --  Laboratory Lab Results:  Recent Labs  09/27/16 0509 09/28/16 0541  WBC 11.6* 11.3*  HGB 8.9* 9.1*  HCT 29.1* 29.7*  PLT 331 355   BMET  Recent Labs  09/27/16 0509 09/28/16 0541  NA 136 138  K 4.2 4.5  CL 97* 97*  CO2 29 29  GLUCOSE 222* 141*  BUN 36* 37*  CREATININE 1.51* 1.43*  CALCIUM 8.5* 8.8*    COAG Lab Results  Component Value Date   INR 1.28 09/28/2016   INR 1.43 09/27/2016   INR 1.50 09/26/2016   No results found for: PTT

## 2016-09-28 NOTE — Op Note (Signed)
    Patient name: [name] MRN: [id number] DOB: 02/28/1946 Sex: female  09/28/2016 Pre-operative Diagnosis: critical right lower extremity ischemia with rest pain Post-operative diagnosis:  Same Surgeon:  Luanna SalkBrandon C. Randie Heinzain, MD Procedure Performed: 1.  US guided cannulation of left common femoral artery 2.  Aortogram with RLE runoff 3.  Stent of left external iliac artery with 7 x 40mm self expanding stent   Indications:  70 year old female with history of a left sided external iliac artery to PT artery bypass. She has a remote history of right lower extremity bypasses as well. She now has rest pain with minimal flow to the foot on the right and is indicated for the above procedure.  Findings: There was a tight stenosis of the left external iliac artery. The left lower extremity bypass is patent proximally. There is diffuse calcification of the aorta and there are stents in the right common iliac artery which appear patent. There is an occlusion of the distal right external iliac artery. Runoff to the ankle is via the peroneal artery which fills the PT artery on the right. She is occluded between the common femoral which is diseased all the way to the peroneal proximally. Intervention would include right common femoral and are possible retrograde stenting depending on inflow and possible femoral to peroneal bypass.   Procedure:  The patient was identified in the holding area and taken to room 8.  She was given moderate sedation with fennel and Versed sterilely prepped and draped in her bilateral groins timeout called. The left common femoral artery was identified under ultrasound cannulated with micropuncture needle and wire. There was difficulty passing the micropuncture sheath but it ultimately did pass. We then exchanged for Bentson wire and placed a 5 French sheath. We used SOS catheter to perform aortogram and then selected the right common iliac artery stent. We used an straight catheter to  perform angiogram of the right lower extremity with the above findings. The sheath on the left side was noted to be occlusive and patient was heparinized. After performing right lower extremity angiogram we then took dedicated views of the left common iliac and external iliac arteries demonstrating tight stenosis there. We then exchanged for a long 6 French sheath and then brought a 7 x 40 self-expanding stent to the level of the stenosis and deployed there. This was postdilated with 6 x 40 balloon. Completion demonstrated brisk runoff on the left side filling the proximal bypass graft. We then exchanged for a short 6 French sheath wire was removed and sheath will be pulled in the holding when ACT returns to normal.  I administer 64 minutes of moderate sedation with fentanyl and Versed.  Contrast administered 170 mL.   Kamron Vanwyhe C. Randie Heinzain, MD [facility] [place] Office: [phone] Pager: [phone]

## 2016-09-28 NOTE — Progress Notes (Signed)
[facility] PROGRESS NOTE  CANESHA [name] YNW:[id number] DOB: [birth year] DOA: 09/19/2016  PCP: Cala Bradford, MD   Subjective/Interval History: Seen prior to her procedure, right foot still 8/10 pain.  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 CBG was noted to be 28. There was also some concern for narcotic overdose. She had good response to Narcan. Patient was hospitalized for further management. Patient's mental status improved with improvement in glucose levels. Then patient started complaining of right foot pain. She has a history of peripheral vascular disease, status post bypass. Concern was about ischemia to the right lower extremity. Doppler study showed limited flow. Vascular surgery was consulted subsequently.  Reason for Visit: Altered mental status  Consultants: Vascular surgery  Procedures:  Right lower extremity arterial Dopplers Right ABIs indicate a severe reduction in arterial flow at rest. Unable to obtain all pressures due to venous signals overriding the arterial signals. Left ABI indicates a moderate reduction in arterial flow at rest.  Antibiotics: Vantin  ROS: Denies chest pain or shortness of breath. No nausea or vomiting.  Objective:  Vital Signs  Vitals:   09/28/16 1124 09/28/16 1129 09/28/16 1150 09/28/16 1215  BP: (!) 151/66 (!) 131/59 (!) 152/56 (!) 130/54  Pulse: 78 81 84 80  Resp: 20 11 19  (!) 21  Temp:      TempSrc:      SpO2: 98% (!) 89% 94% 100%  Weight:      Height:        Intake/Output Summary (Last 24 hours) at 09/28/16 1222 Last data filed at 09/27/16 1300  Gross per 24 hour  Intake             54.6 ml    Output                0 ml  Net             54.6 ml   Filed Weights   09/26/16 0436 09/27/16 0400 09/28/16 0446  Weight: 75.3 kg (166 lb) 74.2 kg (163 lb 8 oz) 74.1 kg (163 lb 4.8 oz)    General appearance: alert, cooperative, appears stated age and no distress Tracheostomy Resp: clear to auscultation bilaterally Cardio: regular rate and rhythm, S1, S2 normal, no murmur, click, rub or gallop GI: soft, non-tender; bowel sounds normal; no masses,  no organomegaly Extremities: Right foot is cold to touch Neurologic: Awake and alert. Oriented 3.  Lab Results:  Data Reviewed: I have personally reviewed following labs and imaging studies  CBC:  Recent Labs Lab 09/22/16 0412 09/24/16 0426 09/26/16 0322 09/27/16 0509 09/28/16 0541  WBC 10.4 11.2* 11.6* 11.6* 11.3*  HGB 9.1* 10.0* 9.3* 8.9* 9.1*  HCT 29.2* 32.0* 30.4* 29.1* 29.7*  MCV 93.3 92.0 92.1 92.7 91.7  PLT 376 384 344 331 355    Basic Metabolic Panel:  Recent Labs Lab 09/24/16 0426 09/25/16 1553 09/26/16 0322 09/27/16 0509 09/28/16 0541  NA 140 137 136 136 138  K 4.1 4.6 4.1 4.2 4.5  CL 100* 98* 98* 97* 97*  CO2 30 30 30 29 29   GLUCOSE 127* 312* 161* 222* 141*  BUN 19 33* 37* 36* 37*  CREATININE 1.37* 1.80* 1.72* 1.51* 1.43*  CALCIUM 8.7*  8.3* 8.4* 8.5* 8.8*  MG  --  2.1  --   --   --     GFR: Estimated Creatinine Clearance: 34.5 mL/min (by C-G formula based on SCr of 1.43 mg/dL (H)).  Liver Function Tests: No results for input(s): AST, ALT, ALKPHOS, BILITOT, PROT, ALBUMIN in the last 168 hours.  Coagulation Profile:  Recent Labs Lab 09/24/16 0426 09/25/16 0451 09/26/16 0322 09/27/16 0509 09/28/16 0541  INR 2.19 1.68 1.50 1.43 1.28    CBG:  Recent Labs Lab 09/27/16 2010 09/27/16 2339 09/28/16 0359 09/28/16 0749 09/28/16 1151  GLUCAP 193* 111* 152* 158* 98     Recent Results (from the past 240 hour(s))  Urine culture     Status: Abnormal   Collection Time: 09/19/16 11:21 PM   Result Value Ref Range Status   Specimen Description URINE, CATHETERIZED  Final   Special Requests NONE  Final   Culture >=100,000 COLONIES/mL KLEBSIELLA PNEUMONIAE (A)  Final   Report Status 09/22/2016 FINAL  Final   Organism ID, Bacteria KLEBSIELLA PNEUMONIAE (A)  Final      Susceptibility   Klebsiella pneumoniae - MIC*    AMPICILLIN >=32 RESISTANT Resistant     CEFAZOLIN <=4 SENSITIVE Sensitive     CEFTRIAXONE <=1 SENSITIVE Sensitive     CIPROFLOXACIN <=0.25 SENSITIVE Sensitive     GENTAMICIN <=1 SENSITIVE Sensitive     IMIPENEM <=0.25 SENSITIVE Sensitive     NITROFURANTOIN 64 INTERMEDIATE Intermediate     TRIMETH/SULFA <=20 SENSITIVE Sensitive     AMPICILLIN/SULBACTAM 4 SENSITIVE Sensitive     PIP/TAZO <=4 SENSITIVE Sensitive     Extended ESBL NEGATIVE Sensitive     * >=100,000 COLONIES/mL KLEBSIELLA PNEUMONIAE  Blood culture (routine x 2)     Status: Abnormal   Collection Time: 09/19/16 11:44 PM  Result Value Ref Range Status   Specimen Description BLOOD RIGHT ARM  Final   Special Requests BOTTLES DRAWN AEROBIC AND ANAEROBIC 5ML  Final   Culture  Setup Time   Final    GRAM POSITIVE COCCI IN PAIRS IN CHAINS ANAEROBIC BOTTLE ONLY Organism ID to follow CRITICAL RESULT CALLED TO, READ BACK BY AND VERIFIED WITH: GREG ABBOTT,PHARMD @0436  09/21/16 MKELLY,MLT    Culture (A)  Final    VIRIDANS STREPTOCOCCUS THE SIGNIFICANCE OF ISOLATING THIS ORGANISM FROM A SINGLE SET OF BLOOD CULTURES WHEN MULTIPLE SETS ARE DRAWN IS UNCERTAIN. PLEASE NOTIFY THE MICROBIOLOGY DEPARTMENT WITHIN ONE WEEK IF SPECIATION AND SENSITIVITIES ARE REQUIRED.    Report Status 09/22/2016 FINAL  Final  Blood Culture ID Panel (Reflexed)     Status: Abnormal   Collection Time: 09/19/16 11:44 PM  Result Value Ref Range Status   Enterococcus species NOT DETECTED NOT DETECTED Final   Listeria monocytogenes NOT DETECTED NOT DETECTED Final   Staphylococcus species NOT DETECTED NOT DETECTED Final   Staphylococcus  aureus NOT DETECTED NOT DETECTED Final   Streptococcus species DETECTED (A) NOT DETECTED Final    Comment: CRITICAL RESULT CALLED TO, READ BACK BY AND VERIFIED WITH: GREG ABBOTT, PHARMD @0436  09/21/16 MKELLY,MLT    Streptococcus agalactiae NOT DETECTED NOT DETECTED Final   Streptococcus pneumoniae NOT DETECTED NOT DETECTED Final   Streptococcus pyogenes NOT DETECTED NOT DETECTED Final   Acinetobacter baumannii NOT DETECTED NOT DETECTED Final   Enterobacteriaceae species NOT DETECTED NOT DETECTED Final   Enterobacter cloacae complex NOT DETECTED NOT DETECTED Final   Escherichia coli NOT DETECTED NOT DETECTED Final   Klebsiella oxytoca NOT DETECTED NOT DETECTED Final  Klebsiella pneumoniae NOT DETECTED NOT DETECTED Final   Proteus species NOT DETECTED NOT DETECTED Final   Serratia marcescens NOT DETECTED NOT DETECTED Final   Haemophilus influenzae NOT DETECTED NOT DETECTED Final   Neisseria meningitidis NOT DETECTED NOT DETECTED Final   Pseudomonas aeruginosa NOT DETECTED NOT DETECTED Final   Candida albicans NOT DETECTED NOT DETECTED Final   Candida glabrata NOT DETECTED NOT DETECTED Final   Candida krusei NOT DETECTED NOT DETECTED Final   Candida parapsilosis NOT DETECTED NOT DETECTED Final   Candida tropicalis NOT DETECTED NOT DETECTED Final  Blood culture (routine x 2)     Status: None   Collection Time: 09/20/16 12:35 AM  Result Value Ref Range Status   Specimen Description BLOOD LEFT ARM  Final   Special Requests BOTTLES DRAWN AEROBIC AND ANAEROBIC  Final   Culture NO GROWTH 5 DAYS  Final   Report Status 09/25/2016 FINAL  Final  MRSA PCR Screening     Status: None   Collection Time: 09/20/16  6:14 PM  Result Value Ref Range Status   MRSA by PCR NEGATIVE NEGATIVE Final    Comment:        The GeneXpert MRSA Assay (FDA approved for NASAL specimens only), is one component of a comprehensive MRSA colonization surveillance program. It is not intended to diagnose  MRSA infection nor to guide or monitor treatment for MRSA infections.       Radiology Studies: No results found.   Medications:  Scheduled: . [MAR Hold] aspirin EC  81 mg Oral Daily  . [MAR Hold] budesonide  0.25 mg Nebulization BID  . [MAR Hold] carvedilol  12.5 mg Oral BID WC  . [MAR Hold] dicyclomine  20 mg Oral TID AC  . [MAR Hold] DULoxetine  60 mg Oral QHS  . [MAR Hold] ferrous sulfate  325 mg Oral Q breakfast  . [MAR Hold] gabapentin  300 mg Oral QHS  . [MAR Hold] hydrALAZINE  25 mg Oral Q8H  . [MAR Hold] insulin aspart  0-15 Units Subcutaneous Q4H  . [MAR Hold] insulin glargine  5 Units Subcutaneous Daily  . [MAR Hold] isosorbide mononitrate  15 mg Oral Daily  . [MAR Hold] levothyroxine  75 mcg Oral QAC breakfast  . [MAR Hold] multivitamin with minerals  1 tablet Oral Daily  . [MAR Hold] pantoprazole  40 mg Oral Daily  . [MAR Hold] simvastatin  40 mg Oral QPM  . [MAR Hold] tiZANidine  2 mg Oral Daily  . [MAR Hold] torsemide  40 mg Oral Daily   Continuous: . sodium chloride    . sodium chloride 1 mL/kg/hr (09/28/16 1157)  . heparin 1,400 Units/hr (09/27/16 1531)   PRN:[MAR Hold] acetaminophen, [MAR Hold] albuterol, [MAR Hold] ipratropium-albuterol, [MAR Hold] oxyCODONE-acetaminophen, [MAR Hold] polyethylene glycol, [MAR Hold] senna-docusate, [MAR Hold] traMADol  Assessment/Plan:  Principal Problem:   Toxic metabolic encephalopathy Active Problems:   Chronic combined systolic and diastolic CHF, NYHA class 2 (EF 30-35%) per ECHO 2017   Hypertension   Chronic respiratory failure with hypoxia (HCC)   Hypoglycemia   Altered mental status   Uncontrolled type 2 diabetes mellitus with complication (HCC)   Essential hypertension   Acute deep vein thrombosis (DVT) of distal end of right lower extremity (HCC)    Right foot pain/Ischemic pain -Arterial Dopplers show severely limited circulation in the right.  -S/p vascular surgery to that lower extremity in the  past and concern is for occlusion of her old grafts.  -Arteriogram done  earlier today with  RLE DVT Warfarin discontinued due to need for angiogram of the lower extremity. Currently on IV heparin. INR is less than 2. She had to be given 2.5 mg of vitamin K on 12/2. When she can be restarted back on anticoagulation, we could consider one of the direct acting agents. She does have CKD and we'll need to discuss this with pharmacy. The direct acting agents were discussed with the patient and her daughter. Benefits and risks were explained. They would like to consider this option if available.  Acute Metabolic Encephalopathy Etiology is thought to be multifactorial including hypoglycemia and UTI. Opioid overdose is unlikely as patient is an skilled nursing facility.  Diabetes type 2 uncontrolled with complications/Hypoglycemia Patient is on long acting insulin at her skilled nursing facility. She did have evidence for hypoglycemia at presentation and also in the hospital. Patient denies any recent changes to her medication dosage. HbA1c is 8.7. CBGs stabilized. She was started on Lantus at lower dose. CBGs are well controlled at this time. Will need to lower her dose of Tresiba at the time of discharge.   Sepsis secondary to UTI Now resolved. Patient was noted to have leukocytosis, however without fever. Urine culture is again growing Klebsiella. She grew the same bacteria on November 15. Based on discharge summary it appears that she was treated with Keflex. Sensitivities are available. On Vantin for now. Also, one out of 2 blood cultures is growing strep viridans. This is most likely a contaminant.  Bacteremia with strep viridans 1/2 blood cultures positive for. This is most likely a contaminant. Patient has not had any fevers.   Urinary tract infection with Klebsiella Continue Vantin. Will need at least 7 days of treatment. End date will be 09/27/16.  Essential hypertension  Stable. Continue  home medications.  Chronic Systolic and Diastolic CHF Compensated. Continue home medications.  Pulmonary hypertension Stable  Chronic respiratory failure (Chronic Tracheostomy) Stable, trach care  One Episode of missed beat overnight Patient was asymptomatic. Electrolytes are within normal range. Continue to monitor.   DVT Prophylaxis: On heparin him a I will consider Eliquis if her renal function allows.   Code Status: Full code  Family Communication: Discussed with the patient  Disposition Plan: Management as outlined above. Await vascular investigations. Will go back to skilled nursing facility when medically ready. Daughter requesting different nursing facility.    LOS: 8 days   [facility] CenterELMAHI,Isair Inabinet A  [facility] Pager [phone] 09/28/2016, 12:22 PM  If 7PM-7AM, please contact night-coverage at [email or web address], password [email or web address]

## 2016-09-28 NOTE — Progress Notes (Signed)
09/28/2016 1500 Received pt from Cath Lab into 2W13.  Pt is A&O, somewhat drowsy.  Tele monitor applied and CCMD notified.  No c/o pain at this time.  Oriented to room, call light and bed.  Call bell in reach. Kathryne HitchAllen, Vyncent Overby C

## 2016-09-28 NOTE — Progress Notes (Signed)
ANTICOAGULATION CONSULT NOTE  Pharmacy Consult for Heparin (Coumadin on hold) Indication: DVT PTA, but here with R. foot ischemia    Patient Measurements: Height: 5\' 2"  (157.5 cm) Weight: 163 lb 4.8 oz (74.1 kg) IBW/kg (Calculated) : 50.1 Heparin Dosing Weight: 66 kg  Labs:  Recent Labs  09/26/16 0322  09/27/16 0509 09/27/16 1627 09/28/16 0541  HGB 9.3*  --  8.9*  --  9.1*  HCT 30.4*  --  29.1*  --  29.7*  PLT 344  --  331  --  355  LABPROT 18.2*  --  17.5*  --  16.1*  INR 1.50  --  1.43  --  1.28  HEPARINUNFRC 0.34  < > 0.25* 0.55 0.57  CREATININE 1.72*  --  1.51*  --  1.43*  < > = values in this interval not displayed.  Estimated Creatinine Clearance: 34.5 mL/min (by C-G formula based on SCr of 1.43 mg/dL (H)).   Assessment:  70 yr old female on Coumadin prior to admission for hx DVT (09/02/16). She has possible occluded bypass grafts in right leg/RLE ischemia.  Coumadin on hold. Last Coumadin dose 11/30.  Received Vitamin K 2.5 mg PO on 09/24/16. INR down to 1.28 today.  Hx GI bleeding (06/2016), presumed diverticular.  Heparin level was therapeutic (0.57) on 1400 units/hr this AM.   Now s/p aortogram of RLE and Stent of left external iliac artery.  Dr. Darrick PennaFields gave orders to restart IV heparin infusion (no bolus) 6 hours after pressure held after sheath removal.  Sheath removal done at 11:22,  No bleeding or no hematoma noted.    Goal of Therapy:  Heparin level 0.3-0.7 units/ml Monitor platelets by anticoagulation protocol: Yes   Plan: Restart IV heparin drip at 1400 units/hr, restart 6 hours after sheath removal.  No bolus.  Restart time will be 17:30 today.  Daily heparin level (~8h after restart) and CBC while on heparin. Please notify pharmacy when ready to restart coumadin.   Thank you for allowing pharmacy to be part of this patients care team. Noah Delaineuth Elisabeth Strom, RPh Clinical Pharmacist Pager: [phone] Pager: [phone] 09/28/2016 5:07 PM

## 2016-09-28 NOTE — Progress Notes (Signed)
Site area: LFA Site Prior to Removal:  Level 0 Pressure Applied For: 30 min Manual:  yes  Patient Status During Pull:  stable Post Pull Site:  Level 0 Post Pull Instructions Given: yes  Post Pull Pulses Present: doppler Dressing Applied:  tegaderm Bedrest begins @ 1410 till 1810 Comments:

## 2016-09-28 NOTE — Progress Notes (Signed)
Trach collar w/6L O2. Bilateral peroneal pulses are dopplered

## 2016-09-28 NOTE — Progress Notes (Signed)
  Progress Note    09/28/2016 9:19 AM * No surgery date entered *  Subjective:  Having right foot pain  Vitals:   09/28/16 0321 09/28/16 0446  BP:  (!) 163/71  Pulse: 89 83  Resp: 20 18  Temp:  98.6 F (37 C)    Physical Exam: aaox3 Non labored respirations  R foot is cooler  CBC    Component Value Date/Time   WBC 11.3 (H) 09/28/2016 0541   RBC 3.24 (L) 09/28/2016 0541   HGB 9.1 (L) 09/28/2016 0541   HCT 29.7 (L) 09/28/2016 0541   PLT 355 09/28/2016 0541   MCV 91.7 09/28/2016 0541   MCH 28.1 09/28/2016 0541   MCHC 30.6 09/28/2016 0541   RDW 16.8 (H) 09/28/2016 0541   LYMPHSABS 0.5 (L) 09/19/2016 2320   MONOABS 1.4 (H) 09/19/2016 2320   EOSABS 0.1 09/19/2016 2320   BASOSABS 0.0 09/19/2016 2320    BMET    Component Value Date/Time   NA 138 09/28/2016 0541   K 4.5 09/28/2016 0541   CL 97 (L) 09/28/2016 0541   CO2 29 09/28/2016 0541   GLUCOSE 141 (H) 09/28/2016 0541   BUN 37 (H) 09/28/2016 0541   CREATININE 1.43 (H) 09/28/2016 0541   CREATININE 1.59 (H) 05/27/2016 1020   CALCIUM 8.8 (L) 09/28/2016 0541   GFRNONAA 36 (L) 09/28/2016 0541   GFRAA 42 (L) 09/28/2016 0541    INR    Component Value Date/Time   INR 1.28 09/28/2016 0541     Intake/Output Summary (Last 24 hours) at 09/28/16 0919 Last data filed at 09/27/16 1300  Gross per 24 hour  Intake             54.6 ml  Output                0 ml  Net             54.6 ml     Assessment:  70 y.o. female with right foot rest pain  Plan: Aortogram today with possible intervention rle, will use C02   Brandon C. Randie Heinzain, MD [facility] [place] Office: [phone] Pager: [phone]  09/28/2016 9:19 AM

## 2016-09-29 ENCOUNTER — Encounter (HOSPITAL_COMMUNITY): Payer: Self-pay | Admitting: Vascular Surgery

## 2016-09-29 ENCOUNTER — Inpatient Hospital Stay (HOSPITAL_COMMUNITY): Payer: Medicare Other

## 2016-09-29 LAB — CBC
HCT: 27.4 % — ABNORMAL LOW (ref 36.0–46.0)
Hemoglobin: 8.6 g/dL — ABNORMAL LOW (ref 12.0–15.0)
MCH: 28.8 pg (ref 26.0–34.0)
MCHC: 31.4 g/dL (ref 30.0–36.0)
MCV: 91.6 fL (ref 78.0–100.0)
PLATELETS: 316 10*3/uL (ref 150–400)
RBC: 2.99 MIL/uL — AB (ref 3.87–5.11)
RDW: 17 % — AB (ref 11.5–15.5)
WBC: 12.2 10*3/uL — AB (ref 4.0–10.5)

## 2016-09-29 LAB — GLUCOSE, CAPILLARY
GLUCOSE-CAPILLARY: 142 mg/dL — AB (ref 65–99)
GLUCOSE-CAPILLARY: 215 mg/dL — AB (ref 65–99)
GLUCOSE-CAPILLARY: 260 mg/dL — AB (ref 65–99)
Glucose-Capillary: 139 mg/dL — ABNORMAL HIGH (ref 65–99)
Glucose-Capillary: 130 mg/dL — ABNORMAL HIGH (ref 65–99)
Glucose-Capillary: 304 mg/dL — ABNORMAL HIGH (ref 65–99)

## 2016-09-29 LAB — BASIC METABOLIC PANEL
Anion gap: 11 (ref 5–15)
BUN: 34 mg/dL — AB (ref 6–20)
CO2: 29 mmol/L (ref 22–32)
CREATININE: 1.39 mg/dL — AB (ref 0.44–1.00)
Calcium: 8.7 mg/dL — ABNORMAL LOW (ref 8.9–10.3)
Chloride: 99 mmol/L — ABNORMAL LOW (ref 101–111)
GFR, EST AFRICAN AMERICAN: 43 mL/min — AB (ref >60)
GFR, EST NON AFRICAN AMERICAN: 37 mL/min — AB (ref >60)
Glucose, Bld: 133 mg/dL — ABNORMAL HIGH (ref 65–99)
Potassium: 4.3 mmol/L (ref 3.5–5.1)
SODIUM: 139 mmol/L (ref 135–145)

## 2016-09-29 LAB — BLOOD GAS, ARTERIAL
Acid-Base Excess: 5.1 mmol/L — ABNORMAL HIGH (ref 0.0–2.0)
Acid-Base Excess: 4.8 mmol/L — ABNORMAL HIGH (ref 0.0–2.0)
Bicarbonate: 29.7 mmol/L — ABNORMAL HIGH (ref 20.0–28.0)
Bicarbonate: 29.7 mmol/L — ABNORMAL HIGH (ref 20.0–28.0)
Drawn by: 418751
Drawn by: 418751
FIO2: 70
FIO2: 70
O2 Saturation: 81.4 %
O2 Saturation: 73.4 %
Patient temperature: 98.6
Patient temperature: 98.6
pCO2 arterial: 48.5 mmHg — ABNORMAL HIGH (ref 32.0–48.0)
pCO2 arterial: 51 mmHg — ABNORMAL HIGH (ref 32.0–48.0)
pH, Arterial: 7.403 (ref 7.350–7.450)
pH, Arterial: 7.383 (ref 7.350–7.450)
pO2, Arterial: 47.7 mmHg — ABNORMAL LOW (ref 83.0–108.0)
pO2, Arterial: 42.4 mmHg — ABNORMAL LOW (ref 83.0–108.0)

## 2016-09-29 LAB — HEPARIN LEVEL (UNFRACTIONATED)
HEPARIN UNFRACTIONATED: 0.46 [IU]/mL (ref 0.30–0.70)
Heparin Unfractionated: 0.54 IU/mL (ref 0.30–0.70)

## 2016-09-29 LAB — PROTIME-INR
INR: 1.25
PROTHROMBIN TIME: 15.7 s — AB (ref 11.4–15.2)

## 2016-09-29 MED ORDER — FUROSEMIDE 10 MG/ML IJ SOLN
60.0000 mg | Freq: Once | INTRAMUSCULAR | Status: AC
  Administered 2016-09-29: 60 mg via INTRAVENOUS
  Filled 2016-09-29: qty 6

## 2016-09-29 NOTE — Progress Notes (Signed)
Clinical Social Worker met patient at bedside to discuss her returning back to [facility]. Patient stated she does not know if she wants to return back to the facility. Patient stated she thinks her daughter wants her to go to another SNF facility but does not know which one. Patient gave CSW verbal permission to contact daughter to find out more information. CSW remains available for support and discharge needs.  Rhea Pink, MSW,  [place]

## 2016-09-29 NOTE — Progress Notes (Signed)
ANTICOAGULATION CONSULT NOTE - Follow Up Consult  Pharmacy Consult for heparin Indication: DVT and LE ischemia  Labs:  Recent Labs  09/27/16 0509  09/28/16 0541 09/28/16 1816 09/29/16 0359  HGB 8.9*  --  9.1*  --  8.6*  HCT 29.1*  --  29.7*  --  27.4*  PLT 331  --  355  --  316  LABPROT 17.5*  --  16.1*  --  15.7*  INR 1.43  --  1.28  --  1.25  HEPARINUNFRC 0.25*  < > 0.57 <0.10* 0.46  CREATININE 1.51*  --  1.43*  --  1.39*  < > = values in this interval not displayed.   Assessment/Plan:  70yo female therapeutic on heparin after resumed. Will continue gtt at current rate and confirm stable with additional level.   Vernard GamblesVeronda Kruti Horacek, PharmD, BCPS  09/29/2016,4:50 AM

## 2016-09-29 NOTE — Consult Note (Signed)
[facility]  Subjective  - feels ok, pain in foot no different, pt easily arousable   Objective (!) 126/47 81 98.6 F (37 C) (Oral) 18 91%  Intake/Output Summary (Last 24 hours) at 09/29/16 0657 Last data filed at 09/29/16 0539  Gross per 24 hour  Intake           657.97 ml  Output                0 ml  Net           657.97 ml   RLE: monophasic peroneal doppler, forefoot cool LLE: 2+ PT pulse  Assessment/Planning:  Acute respiratory event this morning seems to be improving.  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  Doubt perfusion any worse today than it was yesterday. She does not complain of foot pain unless you manipulate the foot.  Options include right femoral endarterectomy with redo right fem peroneal bypass with PTFE (limited durability may last 6 months).  Risk of periop MI 5%, wound infection 3-5 %.  I could do this 12/11 or 12/13.  Other option is observation for now and bypass for worsening symptoms.  I discussed both options with the patient daughter yesterday and the patient today.  If she wishes to proceed with operation will keep on heparin rather than oral anticoagulation and proceed Monday.  Will check with patient again in the morning to see if she has made decision  Fabienne BrunsFields, Ayannah Faddis 09/29/2016 6:57 AM --  Laboratory Lab Results:  Recent Labs  09/28/16 0541 09/29/16 0359  WBC 11.3* 12.2*  HGB 9.1* 8.6*  HCT 29.7* 27.4*  PLT 355 316   BMET  Recent Labs  09/28/16 0541 09/29/16 0359  NA 138 139  K 4.5 4.3  CL 97* 99*  CO2 29 29  GLUCOSE 141* 133*  BUN 37* 34*  CREATININE 1.43* 1.39*  CALCIUM 8.8* 8.7*    COAG Lab Results  Component Value Date   INR 1.25 09/29/2016   INR 1.28 09/28/2016   INR 1.43 09/27/2016   No results found for: PTT

## 2016-09-29 NOTE — Progress Notes (Signed)
OT Cancellation Note  Patient Details Name: [name] MRN: [id number] DOB: 03/15/1946   Cancelled Treatment:    Reason Eval/Treat Not Completed: Other (comment) (pt just got back to bed; declined working with OT). Will follow up as time allows.   Gaye AlkenBailey A Cyleigh Massaro M.S., OTR/L Pager: [phone]  09/29/2016, 4:34 PM

## 2016-09-29 NOTE — Progress Notes (Signed)
Shift event note:  Notified by RN that pt very difficult to arouse which is a change since shift began. Sats also noted in mid 80''s on TC at 28% high flow 02.  CBG WNL. RR RN was paged and responded to bedside. While still getting status report by RN by phone, pt noted more awake. Sats improved slightly w/ increased 02 to 70% w/ 14L high flow. PCXR obtained by RR RN and was noted to be w/o acute changes. ABG requested. At bedside pt noted awake and able to answer simple questions but drifts back to sleep easily if not stimulated. ABG > 7.4/48.5/47.7/29.7. BBS diminished at bases but otherwise CTA. Of note RN also reports no pulse via doppler in (R) ft which is new. Vascular service paged.  Assessment/Plan:  1. Acute (on chronic) hypoxic respiratory failure: Chronic trach, severe COPD, tracheal stenosis. Issue seems to worsen while sleeping. No respiratory distress, denies feeling SOB. Not tachycardic. RR RN concerned about leaving pt on telemetry floor w/ labile sats, questions need for increase in LOC to SDU. Will continue increased 02 delivery and concentration x 2 hrs. Continue continuous pulse oximetry. Repeat ABG at 0700. As pt remains full code may need to consider tx to SDU for closer monitoring given tenuous respiratory status. Needs GOC and perhaps code status re-addressed w/ family and pt.  2. Pulseless RLE: RN spoke w/ Dr Darrick PennaFields with vascular service by phone. No orders or interventions at this time.  Will continue to monitor closely on telemetry pending ABG at 0700.   Leanne ChangKatherine P. Loella Hickle, NP-C [facility] Pager: [phone]

## 2016-09-29 NOTE — Progress Notes (Signed)
I was called to patient's room around 0415 to assess patient, as she was unresponsive to NT.  Upon assessment, I was able to get the patient to open her eyes and grip me with her right hand.  She did not respond to questions and followed minimal commands.  Upon further assessment by CN, the decision was made to call Rapid Response and notify K. Schorr of the situation.  Vitals stable and CBG 139.  RR nurse was able to get patient to arouse further and respond to questions.  Shortly thereafter, patient's O2 level showed to have dropped into the 70s-80s%.  Notified RT and Rapid Response returned.  Natalia LeatherwoodKatherine Wellsite geologistchorr arrived to assess as well. Also, notified Dr. Darrick PennaFields via telephone that we are unable to doppler a pulse in the patient's right foot.  No new orders at this time. Will continue to monitor patient.  Arva ChafeLester, Otie Headlee Michelle

## 2016-09-29 NOTE — Significant Event (Signed)
Rapid Response Event Note Called per floor RN regarding Pt with acute AMS. Advised to get full set of VS and page [facility] Provider while RRT en route.   Overview: Time Called: 0421 Arrival Time: 0428 Event Type: Respiratory  Initial Focused Assessment: Pt found resting in bed. Awakens to loud voice oriented x 4, denies pain or SOB. Falls asleep easily. Per floor RN this is a change for her initial assessment.  Lung sounds clear diminished. Heart tones WNL. MAEW x 4 when awake. Po2 initially found at 28%. Placed Pt on continuous pulse ox with  Po2 82-88% Fio2 Trach Collar increased gradually to 70% HR 80s, BP 126/47, RR 18  Interventions: Jerilynn MagesK. Schor [facility] NP paged and updated. RT called to bedside. ABG and CXR orders completed STAT. ABG results provided to NP at bedside 0500. 7.4, 48, 46, 29. Pt gradually more awake with po2 sats 90-95%  But drop down to mid 80s% when asleep. Pt began complaining of severe pain in right foot at 0500. Unable to doppler a pulse in right foot, cooler than left. Jerilynn MagesK. Schor NP updated and Dr. Darrick PennaFields updated via page and call back. No orders at this time per Dr. Darrick PennaFields. Pt left resting in bed, asleep.    Plan of Care (if not transferred): Plan to repeat ABG at 0700. Low threshold for transfer to SDU. RN to monitor Pt closely, continuous pulse oximeter attached and alarms set. NP ok with po2 sats 88% and above.  RN to notify Provider and RRT for acute worsening.   Event Summary: Name of Physician Notified: Jerilynn MagesK. Schor [facility] NP at [phone]  Name of Consulting Physician Notified: Dr. Darrick PennaFields at 0515  Pt seen at 0655 UPDATE- Awake alert in bed, no distress at this time Po2 91-93% RR 15-18, Dr. Darrick PennaFields at bedside assessing foot. ABG completed and resulted in computer 7.38, 51, 42, 29. [facility] NP paged. RRT to follow this AM    White, James IvanoffBrooke Leigh Mahima Hottle

## 2016-09-29 NOTE — Progress Notes (Signed)
ANTICOAGULATION CONSULT NOTE - Follow Up Consult  Pharmacy Consult for heparin Indication: DVT and LE ischemia  Labs:  Recent Labs  09/27/16 0509  09/28/16 0541 09/28/16 1816 09/29/16 0359 09/29/16 1312  HGB 8.9*  --  9.1*  --  8.6*  --   HCT 29.1*  --  29.7*  --  27.4*  --   PLT 331  --  355  --  316  --   LABPROT 17.5*  --  16.1*  --  15.7*  --   INR 1.43  --  1.28  --  1.25  --   HEPARINUNFRC 0.25*  < > 0.57 <0.10* 0.46 0.54  CREATININE 1.51*  --  1.43*  --  1.39*  --   < > = values in this interval not displayed.   Assessment:  70yo female therapeutic on heparin x 2 heparin levels after  heparin resumed. CBC stable, no bleeding reported.  warfarin pta for DVT. s/p aortogram of RLE and Stent of left external iliac artery.12/6. Home warfarin dose alternating 2.5 and 5 mg - 5mg  taken 11/27 Elmahi considering dc home on Eliquis at DC  Plan: continue heparin drip at 1400 units/hr Daily heparin level and CBC while on heparin  Herby AbrahamMichelle T. Printice Hellmer, Pharm.D. [phone] 09/29/2016 2:37 PM

## 2016-09-29 NOTE — Care Management Note (Signed)
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 Recent admit 11/10-11/17/2017, acute encephalopathy. From [facility].  [name] (Daughter)     [phone]      PCP: [name]  Action/Plan: Return to SNF when medically stable. CSW managing disposition.  Expected Discharge Date:                  Expected Discharge Plan:  Skilled Nursing Facility  In-House Referral:  Clinical Social Work  Discharge planning Services  CM Consult  Post Acute Care Choice:    Choice offered to:     DME Arranged:    DME Agency:     HH Arranged:    HH Agency:     Status of Service:  Completed, signed off  If discussed at [employer] of [employer] Meetings, dates discussed:    Additional Comments:  09/29/16- 1600- Donn PieriniKristi Briggette Najarian RN, CM- pt from [facility]- anticipate return to SNF- CSW following  Darrold SpanWebster, [name], RN 09/29/2016, 4:05 PM [phone]

## 2016-09-29 NOTE — Progress Notes (Signed)
[facility] PROGRESS NOTE  Scot JunDorothy I Tappan ZOX:[id number] DOB: 06/30/1946 DOA: 09/19/2016  PCP: [name],CYNTHIA S, MD   Subjective/Interval History: Was sleeping last night with decreased left consciousness, head and EEG done and showed mild hypoxia and mild hypercapnia. 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 CBG was noted to be 28. There was also some concern for narcotic overdose. She had good response to Narcan. Patient was hospitalized for further management. Patient's mental status improved with improvement in glucose levels. Then patient started complaining of right foot pain. She has a history of peripheral vascular disease, status post bypass. Concern was about ischemia to the right lower extremity. Doppler study showed limited flow. Vascular surgery was consulted subsequently.  Reason for Visit: Altered mental status  Consultants: Vascular surgery  Procedures:  Right lower extremity arterial Dopplers Right ABIs indicate a severe reduction in arterial flow at rest. Unable to obtain all pressures due to venous signals overriding the arterial signals. Left ABI indicates a moderate reduction in arterial flow at rest.  Antibiotics: Vantin  ROS: Denies chest pain or shortness of breath. No nausea or vomiting.  Objective:  Vital Signs  Vitals:   09/29/16 0416 09/29/16 0457 09/29/16 0534 09/29/16 0834  BP: 133/61 (!) 126/47    Pulse: 81     Resp: 20 18    Temp: 98.6 F (37 C)     TempSrc: Oral     SpO2: 96% 93% 91% 100%  Weight: 57.7 kg (127  lb 1.6 oz)     Height:        Intake/Output Summary (Last 24 hours) at 09/29/16 1007 Last data filed at 09/29/16 0539  Gross per 24 hour  Intake           657.97 ml  Output                0 ml  Net           657.97 ml   Filed Weights   09/27/16 0400 09/28/16 0446 09/29/16 0416  Weight: 74.2 kg (163 lb 8 oz) 74.1 kg (163 lb 4.8 oz) 57.7 kg (127 lb 1.6 oz)    General appearance: alert, cooperative, appears stated age and no distress Tracheostomy Resp: clear to auscultation bilaterally Cardio: regular rate and rhythm, S1, S2 normal, no murmur, click, rub or gallop GI: soft, non-tender; bowel sounds normal; no masses,  no organomegaly Extremities: Right foot is cold to touch Neurologic: Awake and alert. Oriented 3.  Lab Results:  Data Reviewed: I have personally reviewed following labs and imaging studies  CBC:  Recent Labs Lab 09/24/16 0426 09/26/16 0322 09/27/16 0509 09/28/16 0541 09/29/16 0359  WBC 11.2* 11.6* 11.6* 11.3* 12.2*  HGB 10.0* 9.3* 8.9* 9.1* 8.6*  HCT 32.0* 30.4* 29.1* 29.7* 27.4*  MCV 92.0 92.1 92.7 91.7 91.6  PLT 384 344 331 355 316    Basic Metabolic Panel:  Recent Labs Lab 09/25/16 1553 09/26/16 0322 09/27/16 0509 09/28/16 0541 09/29/16 0359  NA 137 136 136 138 139  K 4.6 4.1 4.2 4.5 4.3  CL 98* 98* 97* 97* 99*  CO2 30 30  29 29 29   GLUCOSE 312* 161* 222* 141* 133*  BUN 33* 37* 36* 37* 34*  CREATININE 1.80* 1.72* 1.51* 1.43* 1.39*  CALCIUM 8.3* 8.4* 8.5* 8.8* 8.7*  MG 2.1  --   --   --   --     GFR: Estimated Creatinine Clearance: 29.8 mL/min (by C-G formula based on SCr of 1.39 mg/dL (H)).  Liver Function Tests: No results for input(s): AST, ALT, ALKPHOS, BILITOT, PROT, ALBUMIN in the last 168 hours.  Coagulation Profile:  Recent Labs Lab 09/25/16 0451 09/26/16 0322 09/27/16 0509 09/28/16 0541 09/29/16 0359  INR 1.68 1.50 1.43 1.28 1.25    CBG:  Recent Labs Lab 09/28/16 1633 09/28/16 2132 09/29/16 0416  09/29/16 0619 09/29/16 0756  GLUCAP 111* 174* 139* 130* 142*     Recent Results (from the past 240 hour(s))  Urine culture     Status: Abnormal   Collection Time: 09/19/16 11:21 PM  Result Value Ref Range Status   Specimen Description URINE, CATHETERIZED  Final   Special Requests NONE  Final   Culture >=100,000 COLONIES/mL KLEBSIELLA PNEUMONIAE (A)  Final   Report Status 09/22/2016 FINAL  Final   Organism ID, Bacteria KLEBSIELLA PNEUMONIAE (A)  Final      Susceptibility   Klebsiella pneumoniae - MIC*    AMPICILLIN >=32 RESISTANT Resistant     CEFAZOLIN <=4 SENSITIVE Sensitive     CEFTRIAXONE <=1 SENSITIVE Sensitive     CIPROFLOXACIN <=0.25 SENSITIVE Sensitive     GENTAMICIN <=1 SENSITIVE Sensitive     IMIPENEM <=0.25 SENSITIVE Sensitive     NITROFURANTOIN 64 INTERMEDIATE Intermediate     TRIMETH/SULFA <=20 SENSITIVE Sensitive     AMPICILLIN/SULBACTAM 4 SENSITIVE Sensitive     PIP/TAZO <=4 SENSITIVE Sensitive     Extended ESBL NEGATIVE Sensitive     * >=100,000 COLONIES/mL KLEBSIELLA PNEUMONIAE  Blood culture (routine x 2)     Status: Abnormal   Collection Time: 09/19/16 11:44 PM  Result Value Ref Range Status   Specimen Description BLOOD RIGHT ARM  Final   Special Requests BOTTLES DRAWN AEROBIC AND ANAEROBIC  Final   Culture  Setup Time   Final    GRAM POSITIVE COCCI IN PAIRS IN CHAINS ANAEROBIC BOTTLE ONLY Organism ID to follow CRITICAL RESULT CALLED TO, READ BACK BY AND VERIFIED WITH: GREG ABBOTT,PHARMD @0436  09/21/16 MKELLY,MLT    Culture (A)  Final    VIRIDANS STREPTOCOCCUS THE SIGNIFICANCE OF ISOLATING THIS ORGANISM FROM A SINGLE SET OF BLOOD CULTURES WHEN MULTIPLE SETS ARE DRAWN IS UNCERTAIN. PLEASE NOTIFY THE MICROBIOLOGY DEPARTMENT WITHIN ONE WEEK IF SPECIATION AND SENSITIVITIES ARE REQUIRED.    Report Status 09/22/2016 FINAL  Final  Blood Culture ID Panel (Reflexed)     Status: Abnormal   Collection Time: 09/19/16 11:44 PM  Result Value Ref Range Status    Enterococcus species NOT DETECTED NOT DETECTED Final   Listeria monocytogenes NOT DETECTED NOT DETECTED Final   Staphylococcus species NOT DETECTED NOT DETECTED Final   Staphylococcus aureus NOT DETECTED NOT DETECTED Final   Streptococcus species DETECTED (A) NOT DETECTED Final    Comment: CRITICAL RESULT CALLED TO, READ BACK BY AND VERIFIED WITH: GREG ABBOTT, PHARMD @0436  09/21/16 MKELLY,MLT    Streptococcus agalactiae NOT DETECTED NOT DETECTED Final   Streptococcus pneumoniae NOT DETECTED NOT DETECTED Final   Streptococcus pyogenes NOT DETECTED NOT DETECTED Final   Acinetobacter baumannii NOT DETECTED NOT DETECTED Final   Enterobacteriaceae species NOT DETECTED NOT DETECTED Final   Enterobacter  cloacae complex NOT DETECTED NOT DETECTED Final   Escherichia coli NOT DETECTED NOT DETECTED Final   Klebsiella oxytoca NOT DETECTED NOT DETECTED Final   Klebsiella pneumoniae NOT DETECTED NOT DETECTED Final   Proteus species NOT DETECTED NOT DETECTED Final   Serratia marcescens NOT DETECTED NOT DETECTED Final   Haemophilus influenzae NOT DETECTED NOT DETECTED Final   Neisseria meningitidis NOT DETECTED NOT DETECTED Final   Pseudomonas aeruginosa NOT DETECTED NOT DETECTED Final   Candida albicans NOT DETECTED NOT DETECTED Final   Candida glabrata NOT DETECTED NOT DETECTED Final   Candida krusei NOT DETECTED NOT DETECTED Final   Candida parapsilosis NOT DETECTED NOT DETECTED Final   Candida tropicalis NOT DETECTED NOT DETECTED Final  Blood culture (routine x 2)     Status: None   Collection Time: 09/20/16 12:35 AM  Result Value Ref Range Status   Specimen Description BLOOD LEFT ARM  Final   Special Requests BOTTLES DRAWN AEROBIC AND ANAEROBIC  Final   Culture NO GROWTH 5 DAYS  Final   Report Status 09/25/2016 FINAL  Final  MRSA PCR Screening     Status: None   Collection Time: 09/20/16  6:14 PM  Result Value Ref Range Status   MRSA by PCR NEGATIVE NEGATIVE Final    Comment:          The GeneXpert MRSA Assay (FDA approved for NASAL specimens only), is one component of a comprehensive MRSA colonization surveillance program. It is not intended to diagnose MRSA infection nor to guide or monitor treatment for MRSA infections.       Radiology Studies: Dg Chest Port 1 View  Result Date: 09/29/2016 CLINICAL DATA:  Dyspnea this morning EXAM: PORTABLE CHEST 1 VIEW COMPARISON:  09/20/2016 FINDINGS: Tracheostomy appliance appears since satisfactorily positioned. Unchanged moderate cardiomegaly. No airspace consolidation. No effusions. IMPRESSION: Moderate cardiomegaly.  No consolidation or effusion. Electronically Signed   By: Ellery Plunk M.D.   On: 09/29/2016 05:05     Medications:  Scheduled: . aspirin EC  81 mg Oral Daily  . budesonide  0.25 mg Nebulization BID  . carvedilol  12.5 mg Oral BID WC  . dicyclomine  20 mg Oral TID AC  . DULoxetine  60 mg Oral QHS  . ferrous sulfate  325 mg Oral Q breakfast  . gabapentin  300 mg Oral QHS  . hydrALAZINE  25 mg Oral Q8H  . insulin aspart  0-15 Units Subcutaneous Q4H  . insulin glargine  5 Units Subcutaneous Daily  . isosorbide mononitrate  15 mg Oral Daily  . levothyroxine  75 mcg Oral QAC breakfast  . multivitamin with minerals  1 tablet Oral Daily  . pantoprazole  40 mg Oral Daily  . simvastatin  40 mg Oral QPM  . tiZANidine  2 mg Oral Daily  . torsemide  40 mg Oral Daily   Continuous: . 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  -S/p vascular surgery to that lower extremity in the past and concern is for occlusion of her old grafts.  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  RLE DVT Warfarin discontinued due to need for angiogram of the lower extremity. Currently on IV heparin. INR is less than 2. She had to be given 2.5 mg of vitamin K on 12/2. When she can be restarted back on anticoagulation, we could consider one of the direct acting agents.  Likely can start Eliquis on discharge.  Acute Metabolic Encephalopathy Etiology is thought to be multifactorial including hypoglycemia and UTI. Opioid overdose is unlikely as patient is an skilled nursing facility. Had some somnolence last night, likely secondary to slight hypercapnia.  Diabetes type 2 uncontrolled with complications/Hypoglycemia Patient is on long acting insulin at her skilled nursing facility. She did have evidence for hypoglycemia at presentation and also in the hospital. Patient denies any recent changes to her medication dosage. HbA1c is 8.7. CBGs stabilized. She was started on Lantus at lower dose. CBGs are well controlled at this time. Will need to lower her dose of Tresiba at the time of discharge.   Sepsis secondary to UTI Now resolved. Patient was noted to have leukocytosis, however without fever. Urine culture is again growing Klebsiella. She grew the same bacteria on November 15. Based on discharge summary it appears that she was treated with Keflex. Sensitivities are available. On Vantin for now. Also, one out of 2 blood cultures is growing strep viridans. This is most likely a contaminant.  Bacteremia with strep viridans 1/2 blood cultures positive  for. This is most likely a contaminant. Patient has not had any fevers.   Urinary tract infection with Klebsiella Continue Vantin. Will need at least 7 days of treatment. End date will be 09/27/16.  Essential hypertension  Stable. Continue home medications.  Chronic Systolic and Diastolic CHF Compensated. Continue home medications.  Pulmonary hypertension Stable  Chronic respiratory failure (Chronic Tracheostomy) Stable, trach care, mucous plug removed on 12/7 AM by nursing staff.  One Episode of missed beat overnight Patient was asymptomatic. Electrolytes are within normal range. Continue to monitor.   DVT Prophylaxis: On heparin him a I will consider Eliquis if her renal function allows.   Code Status: Full code  Family Communication: Discussed with the patient  Disposition Plan: Management as outlined above. Await vascular investigations. Will go back to skilled nursing facility when medically ready. Daughter requesting different nursing facility.    LOS: 9 days   [facility] Behavior[facility] Health SystemELMAHI,Joey Hudock A  [facility] Pager [phone] 09/29/2016, 10:07 AM  If 7PM-7AM, please contact night-coverage at [email or web address], password [email or web address]

## 2016-09-29 NOTE — Progress Notes (Signed)
Physical Therapy Treatment Patient Details Name: Becky JunDorothy I [name] MRN: [id number] DOB: 12/22/1945 Today's Date: 09/29/2016    History of Present Illness Pt is a 70 y/o female admitted secondary to AMS, toxic metabolic encephalopathy. PMH including but not limited to chronic trach dependent, CAD, CKD, COPD, PAD, CVA (2013), DM and hx of CABG.     PT Comments    Patient progressing slowly with ambulation due to painful R foot and limited tolerance to activity.  Does report this session she would have daughters to assist at home x 2 weeks when d/c.  Continue to feel STSNF indicated until foot pain improved and more stable on her feet then could go home with family support.  Will continue to follow acutely.   Follow Up Recommendations  SNF     Equipment Recommendations  None recommended by PT    Recommendations for Other Services       Precautions / Restrictions Precautions Precautions: Fall Precaution Comments: Chronic Trach Restrictions Other Position/Activity Restrictions: Per notes pt to f/u as outpatient for subacute R 5th phalanx fx.      Mobility  Bed Mobility               General bed mobility comments: up in chair  Transfers Overall transfer level: Needs assistance Equipment used: Rolling walker (2 wheeled) Transfers: Sit to/from [employer]Stand;Stand Pivot Transfers Sit to Stand: Min assist Stand pivot transfers: Min assist       General transfer comment: up to [facility] with assist up from recliner, cues for hand placement; then up from [facility] to walker  Ambulation/Gait Ambulation/Gait assistance: Min assist Ambulation Distance (Feet): 14 Feet Assistive device: Rolling walker (2 wheeled) Gait Pattern/deviations: Trunk flexed;Step-to pattern;Wide base of support;Decreased stride length     General Gait Details: cues for safety with keeping walker close. assist for safety/balance; very slow and antalgic on R foot (also noted R knee crepitus at times)   Stairs             Wheelchair Mobility    Modified Rankin (Stroke Patients Only)       Balance Overall balance assessment: Needs assistance         Standing balance support: Bilateral upper extremity supported Standing balance-Leahy Scale: Poor Standing balance comment: UE support needed for balance                    Cognition Arousal/Alertness: Awake/alert Behavior During Therapy: WFL for tasks assessed/performed Overall Cognitive Status: Within Functional Limits for tasks assessed                      Exercises      General Comments        Pertinent Vitals/Pain Pain Score: 8  Pain Location: r foot Pain Descriptors / Indicators: Aching;Grimacing;Guarding Pain Intervention(s): Monitored during session;Limited activity within patient's tolerance;Repositioned    Home Living                      Prior Function            PT Goals (current goals can now be found in the care plan section) Progress towards PT goals: Progressing toward goals    Frequency    Min 3X/week      PT Plan Current plan remains appropriate    Co-evaluation             End of Session Equipment Utilized During Treatment: Gait belt;Oxygen Activity Tolerance: Patient limited by  pain Patient left: in chair;with call bell/phone within reach     Time: 1420-1443 PT Time Calculation (min) (ACUTE ONLY): 23 min  Charges:  $Gait Training: 8-22 mins $Therapeutic Activity: 8-22 mins                    G Codes:      Becky McgregorCynthia Harim [name] 09/29/2016, 3:04 PM [name], PT [phone] 09/29/2016

## 2016-09-29 NOTE — Progress Notes (Signed)
Clinical Social Worker spoke to patients daughter Elonda Husky(cassandra) via phone. [name] stated that she does [facility] has had bad experience with them in terms of the facility taking care of patient. [name] stated she would [facility], CSW contacted facility to see bed availabilities and left voicemail for Bjorn LoserRhonda (Aeronautical engineeradministration coordinator) to coordinate bed for discharge. [name] made aware that patient has other bed offers and in case [facility] falls through, she should have back up. [name] refused and stated she [facility] and that she has already spoken to [place]onda at the facility. CSW will remain available for support  And discharge needs.  Marrianne MoodAshley Nichele Slawson, MSW,  [employer]LCSWA [phone]

## 2016-09-30 ENCOUNTER — Ambulatory Visit: Payer: Medicare Other | Admitting: Cardiology

## 2016-09-30 LAB — GLUCOSE, CAPILLARY
GLUCOSE-CAPILLARY: 141 mg/dL — AB (ref 65–99)
GLUCOSE-CAPILLARY: 223 mg/dL — AB (ref 65–99)
GLUCOSE-CAPILLARY: 285 mg/dL — AB (ref 65–99)
Glucose-Capillary: 160 mg/dL — ABNORMAL HIGH (ref 65–99)
Glucose-Capillary: 286 mg/dL — ABNORMAL HIGH (ref 65–99)
Glucose-Capillary: 93 mg/dL (ref 65–99)

## 2016-09-30 LAB — BASIC METABOLIC PANEL
Anion gap: 7 (ref 5–15)
BUN: 35 mg/dL — ABNORMAL HIGH (ref 6–20)
CHLORIDE: 97 mmol/L — AB (ref 101–111)
CO2: 33 mmol/L — ABNORMAL HIGH (ref 22–32)
CREATININE: 1.4 mg/dL — AB (ref 0.44–1.00)
Calcium: 8.8 mg/dL — ABNORMAL LOW (ref 8.9–10.3)
GFR, EST AFRICAN AMERICAN: 43 mL/min — AB (ref >60)
GFR, EST NON AFRICAN AMERICAN: 37 mL/min — AB (ref >60)
Glucose, Bld: 142 mg/dL — ABNORMAL HIGH (ref 65–99)
POTASSIUM: 4 mmol/L (ref 3.5–5.1)
SODIUM: 137 mmol/L (ref 135–145)

## 2016-09-30 LAB — CBC
HEMATOCRIT: 26.2 % — AB (ref 36.0–46.0)
HEMOGLOBIN: 8 g/dL — AB (ref 12.0–15.0)
MCH: 27.9 pg (ref 26.0–34.0)
MCHC: 30.5 g/dL (ref 30.0–36.0)
MCV: 91.3 fL (ref 78.0–100.0)
Platelets: 330 10*3/uL (ref 150–400)
RBC: 2.87 MIL/uL — ABNORMAL LOW (ref 3.87–5.11)
RDW: 17.1 % — ABNORMAL HIGH (ref 11.5–15.5)
WBC: 13 10*3/uL — ABNORMAL HIGH (ref 4.0–10.5)

## 2016-09-30 LAB — HEPARIN LEVEL (UNFRACTIONATED): HEPARIN UNFRACTIONATED: 0.31 [IU]/mL (ref 0.30–0.70)

## 2016-09-30 LAB — PROTIME-INR
INR: 1.22
Prothrombin Time: 15.4 seconds — ABNORMAL HIGH (ref 11.4–15.2)

## 2016-09-30 MED ORDER — DEXTROSE 5 % IV SOLN
1.5000 g | INTRAVENOUS | Status: AC
  Administered 2016-10-03: 1.5 g via INTRAVENOUS
  Filled 2016-09-30 (×2): qty 1.5

## 2016-09-30 MED ORDER — HEPARIN (PORCINE) IN NACL 100-0.45 UNIT/ML-% IJ SOLN
1450.0000 [IU]/h | INTRAMUSCULAR | Status: DC
  Administered 2016-10-01 (×2): 1450 [IU]/h via INTRAVENOUS
  Filled 2016-09-30 (×2): qty 250

## 2016-09-30 NOTE — Care Management Note (Signed)
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 Recent admit 11/10-11/17/2017, acute encephalopathy. From [facility].  [name] (Daughter)     [phone]      PCP: [name]  Action/Plan: Return to SNF when medically stable. CSW managing disposition.  Expected Discharge Date:                  Expected Discharge Plan:  Skilled Nursing Facility  In-House Referral:  Clinical Social Work  Discharge planning Services  CM Consult  Post Acute Care Choice:    Choice offered to:     DME Arranged:    DME Agency:     HH Arranged:    HH Agency:     Status of Service:  In process, will continue to follow  If discussed at Long Length of Stay Meetings, dates discussed:    Additional Comments:  09/30/16- 1600- Becky PieriniKristi Travelle Mcclimans RN, CM- pt for OR for  right fem peroneal bypass 12/11  09/29/16- 1600- Becky Shere RN, CM- [facility]- anticipate return to SNF- CSW following  [name], Becky Mccravy Hall, RN 09/30/2016, 3:56 PM [phone]

## 2016-09-30 NOTE — Progress Notes (Signed)
[facility] PROGRESS NOTE  Scot JunDorothy I Kissinger ZOX:[id number] DOB: 03/20/1946 DOA: 09/19/2016  PCP: [name],CYNTHIA S, MD   Subjective/Interval History: Back to baseline, awake, alert and oriented 3, no complaints. 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 CBG was noted to be 28. There was also some concern for narcotic overdose. She had good response to Narcan. Patient was hospitalized for further management. Patient's mental status improved with improvement in glucose levels. Then patient started complaining of right foot pain. She has a history of peripheral vascular disease, status post bypass. Concern was about ischemia to the right lower extremity. Doppler study showed limited flow. Vascular surgery was consulted subsequently.  Reason for Visit: Altered mental status  Consultants: Vascular surgery  Procedures:  Right lower extremity arterial Dopplers Right ABIs indicate a severe reduction in arterial flow at rest. Unable to obtain all pressures due to venous signals overriding the arterial signals. Left ABI indicates a moderate reduction in arterial flow at rest.  Antibiotics: Vantin  ROS: Denies chest pain or shortness of breath. No nausea or vomiting.  Objective:  Vital Signs  Vitals:   09/30/16 0344 09/30/16 0427 09/30/16 0736 09/30/16 0738  BP:  (!) 139/52    Pulse: 76 73  75  Resp: 20 18  15   Temp:  98.1 F (36.7 C)    TempSrc:  Oral    SpO2: 97% 100% 95% 95%  Weight:  69.9 kg (154 lb 3.2 oz)    Height:        Intake/Output Summary  (Last 24 hours) at 09/30/16 1028 Last data filed at 09/30/16 0900  Gross per 24 hour  Intake             1072 ml  Output                0 ml  Net             1072 ml   Filed Weights   09/28/16 0446 09/29/16 0416 09/30/16 0427  Weight: 74.1 kg (163 lb 4.8 oz) 57.7 kg (127 lb 1.6 oz) 69.9 kg (154 lb 3.2 oz)    General appearance: alert, cooperative, appears stated age and no distress Tracheostomy Resp: clear to auscultation bilaterally Cardio: regular rate and rhythm, S1, S2 normal, no murmur, click, rub or gallop GI: soft, non-tender; bowel sounds normal; no masses,  no organomegaly Extremities: Right foot is cold to touch Neurologic: Awake and alert. Oriented 3.  Lab Results:  Data Reviewed: I have personally reviewed following labs and imaging studies  CBC:  Recent Labs Lab 09/26/16 0322 09/27/16 0509 09/28/16 0541 09/29/16 0359 09/30/16 0300  WBC 11.6* 11.6* 11.3* 12.2* 13.0*  HGB 9.3* 8.9* 9.1* 8.6* 8.0*  HCT 30.4* 29.1* 29.7* 27.4* 26.2*  MCV 92.1 92.7 91.7 91.6 91.3  PLT 344 331 355 316 330    Basic Metabolic Panel:  Recent Labs Lab 09/25/16 1553 09/26/16 0322 09/27/16 0509 09/28/16 0541 09/29/16 0359 09/30/16 0719  NA 137 136 136 138 139 137  K 4.6 4.1 4.2 4.5 4.3 4.0  CL 98* 98* 97* 97* 99* 97*  CO2 30 30  29 29 29  33*  GLUCOSE 312* 161* 222* 141* 133* 142*  BUN 33* 37* 36* 37* 34* 35*  CREATININE 1.80* 1.72* 1.51* 1.43* 1.39* 1.40*  CALCIUM 8.3* 8.4* 8.5* 8.8* 8.7* 8.8*  MG 2.1  --   --   --   --   --     GFR: Estimated Creatinine Clearance: 34.2 mL/min (by C-G formula based on SCr of 1.4 mg/dL (H)).  Liver Function Tests: No results for input(s): AST, ALT, ALKPHOS, BILITOT, PROT, ALBUMIN in the last 168 hours.  Coagulation Profile:  Recent Labs Lab 09/26/16 0322 09/27/16 0509 09/28/16 0541 09/29/16 0359 09/30/16 0300  INR 1.50 1.43 1.28 1.25 1.22    CBG:  Recent Labs Lab 09/29/16 1635 09/29/16 2007 09/30/16 0028  09/30/16 0413 09/30/16 0808  GLUCAP 260* 304* 93 160* 141*     Recent Results (from the past 240 hour(s))  MRSA PCR Screening     Status: None   Collection Time: 09/20/16  6:14 PM  Result Value Ref Range Status   MRSA by PCR NEGATIVE NEGATIVE Final    Comment:        The GeneXpert MRSA Assay (FDA approved for NASAL specimens only), is one component of a comprehensive MRSA colonization surveillance program. It is not intended to diagnose MRSA infection nor to guide or monitor treatment for MRSA infections.       Radiology Studies: Dg Chest Port 1 View  Result Date: 09/29/2016 CLINICAL DATA:  Dyspnea this morning EXAM: PORTABLE CHEST 1 VIEW COMPARISON:  09/20/2016 FINDINGS: Tracheostomy appliance appears since satisfactorily positioned. Unchanged moderate cardiomegaly. No airspace consolidation. No effusions. IMPRESSION: Moderate cardiomegaly.  No consolidation or effusion. Electronically Signed   By: Ellery Plunk M.D.   On: 09/29/2016 05:05     Medications:  Scheduled: . aspirin EC  81 mg Oral Daily  . budesonide  0.25 mg Nebulization BID  . carvedilol  12.5 mg Oral BID WC  . dicyclomine  20 mg Oral TID AC  . DULoxetine  60 mg Oral QHS  . ferrous sulfate  325 mg Oral Q breakfast  . gabapentin  300 mg Oral QHS  . hydrALAZINE  25 mg Oral Q8H  . insulin aspart  0-15 Units Subcutaneous Q4H  . insulin glargine  5 Units Subcutaneous Daily  . isosorbide mononitrate  15 mg Oral Daily  . levothyroxine  75 mcg Oral QAC breakfast  . multivitamin with minerals  1 tablet Oral Daily  . pantoprazole  40 mg Oral Daily  . simvastatin  40 mg Oral QPM  . tiZANidine  2 mg Oral Daily  . torsemide  40 mg Oral Daily   Continuous: . heparin 1,450 Units/hr (09/30/16 0908)   ZOX:[id number], albuterol, ipratropium-albuterol, ondansetron (ZOFRAN) IV, oxyCODONE-acetaminophen, polyethylene glycol, senna-docusate, traMADol  Assessment/Plan:  Principal Problem:   Toxic metabolic  encephalopathy Active Problems:   Chronic combined systolic and diastolic CHF, NYHA class 2 (EF 30-35%) per ECHO 2017   Hypertension   Chronic respiratory failure with hypoxia (HCC)   Hypoglycemia   Altered mental status   Uncontrolled type 2 diabetes mellitus with complication (HCC)   Essential hypertension   Acute deep vein thrombosis (DVT) of distal end of right lower extremity (HCC)    Right foot pain/Ischemic pain -Arterial Dopplers show severely limited circulation in the right.  -S/p vascular surgery to that lower extremity in the past and concern is for occlusion of her old grafts.  -Arteriogram done On 09/28/2016 showed occlusion of the right external  iliac artery, a stent placed on the left. -Patient discussing with vascular surgery the timing of right fem-peroneal bypass  RLE DVT -Warfarin discontinued due to need for angiogram of the lower extremity. Currently on IV heparin. INR is less than 2.  -She had to be given 2.5 mg of vitamin K on 12/2.  -When she can be restarted back on anticoagulation, we could consider one of the direct acting agents.  -Likely can start Eliquis on discharge.  Acute Metabolic Encephalopathy -Etiology is thought to be multifactorial including hypoglycemia and UTI. -Opioid overdose is unlikely as patient is an skilled nursing facility. -Somnolence observed on 12/7 AM resolved.  Diabetes type 2 uncontrolled with complications/Hypoglycemia Patient is on long acting insulin at her skilled nursing facility. She did have evidence for hypoglycemia at presentation and also in the hospital. Patient denies any recent changes to her medication dosage. HbA1c is 8.7. CBGs stabilized. She was started on Lantus at lower dose. CBGs are well controlled at this time. Will need to lower her dose of Tresiba at the time of discharge.   Sepsis secondary to UTI Now resolved. Patient was noted to have leukocytosis, however without fever. Urine culture is again  growing Klebsiella. She grew the same bacteria on November 15. Based on discharge summary it appears that she was treated with Keflex. Sensitivities are available. On Vantin for now. Also, one out of 2 blood cultures is growing strep viridans. This is most likely a contaminant.  Bacteremia with strep viridans 1/2 blood cultures positive for. This is most likely a contaminant. Patient has not had any fevers.   Urinary tract infection with Klebsiella Continue Vantin. Will need at least 7 days of treatment. End date will be 09/27/16.  Essential hypertension  Stable. Continue home medications.  Chronic Systolic and Diastolic CHF Compensated. Continue home medications.  Pulmonary hypertension Stable  Chronic respiratory failure (Chronic Tracheostomy) Stable, trach care, mucous plug removed on 12/7 AM by nursing staff.  One Episode of missed beat overnight Patient was asymptomatic. Electrolytes are within normal range. Continue to monitor.   DVT Prophylaxis: On heparin him a I will consider Eliquis if her renal function allows.   Code Status: Full code  Family Communication: Discussed with the patient  Disposition Plan: Management as outlined above. Await vascular investigations. Will go back to skilled nursing facility when medically ready. Daughter requesting different nursing facility.    LOS: 10 days   Hospital For Sick ChildrenELMAHI,Sheryll Dymek A  [facility] Pager [phone] 09/30/2016, 10:28 AM  If 7PM-7AM, please contact night-coverage at [email or web address], password [email or web address]

## 2016-09-30 NOTE — Consult Note (Addendum)
[facility]  Subjective  - right foot hurts   Objective (!) 139/52 75 98.1 F (36.7 C) (Oral) 15 95%  Intake/Output Summary (Last 24 hours) at 09/30/16 1040 Last data filed at 09/30/16 0900  Gross per 24 hour  Intake             1072 ml  Output                0 ml  Net             1072 ml   Right foot ruborous motor sensation intact Left foot 2+ PT pulse  Assessment/Planning: Anemia no obvious bleeding source may need transfusion Right foot ischemia plan for right fem peroneal bypass 12/11.  Left message with pt daughter Elonda HuskyCassandra Type and screen and preop labs orders written for Monday morning. Dr Myra GianottiBrabham on call this weekend if questions   Fabienne BrunsFields, Charles 09/30/2016 10:40 AM --  Laboratory Lab Results:  Recent Labs  09/29/16 0359 09/30/16 0300  WBC 12.2* 13.0*  HGB 8.6* 8.0*  HCT 27.4* 26.2*  PLT 316 330   BMET  Recent Labs  09/29/16 0359 09/30/16 0719  NA 139 137  K 4.3 4.0  CL 99* 97*  CO2 29 33*  GLUCOSE 133* 142*  BUN 34* 35*  CREATININE 1.39* 1.40*  CALCIUM 8.7* 8.8*    COAG Lab Results  Component Value Date   INR 1.22 09/30/2016   INR 1.25 09/29/2016   INR 1.28 09/28/2016   No results found for: PTT

## 2016-09-30 NOTE — Care Management Important Message (Signed)
Important Message  Patient Details  Name: [name] MRN: [id number] Date of Birth: 11/01/1945   Medicare Important Message Given:  Yes    Kyla BalzarineShealy, Kailynn Satterly Abena 09/30/2016, 12:14 PM

## 2016-09-30 NOTE — Progress Notes (Signed)
Inpatient Diabetes Program Recommendations  AACE/ADA: New Consensus Statement on Inpatient Glycemic Control (2015)  Target Ranges:  Prepandial:   less than 140 mg/dL      Peak postprandial:   less than 180 mg/dL (1-2 hours)      Critically ill patients:  140 - 180 mg/dL   Lab Results  Component Value Date   GLUCAP 141 (H) 09/30/2016   HGBA1C 8.7 (H) 09/02/2016    Review of Glycemic Control Results for [name], Aurie I (MRN [id number]) as of 09/30/2016 10:37  Ref. Range 09/29/2016 06:19 09/29/2016 07:56 09/29/2016 11:22 09/29/2016 16:35 09/29/2016 20:07 09/30/2016 00:28 09/30/2016 04:13 09/30/2016 08:08  Glucose-Capillary Latest Ref Range: 65 - 99 mg/dL 914130 (H) 782142 (H) 956215 (H) 260 (H) 304 (H) 93 160 (H) 141 (H)   Diabetes history: DM2 Outpatient Diabetes medications: Tresiba 24 units hs Current orders for Inpatient glycemic control: Lantus 5 units + Novolog correction 0-15 units q 4 hrs  Inpatient Diabetes Program Recommendations:    Noted patient has received Novolog 29 units over the past 24 hrs. Please consider: -Increase Lantus to 12 units -Decrease Novolog correction to 0-9 units tid +0-5 units hs  Thank you, Darel HongJudy E. Vernica Wachtel, RN, MSN, CDE Inpatient Glycemic Control Team Team Pager [phone] (8am-5pm) 09/30/2016 10:44 AM

## 2016-09-30 NOTE — Progress Notes (Signed)
ANTICOAGULATION CONSULT NOTE - Follow Up Consult  Pharmacy Consult for Heparin Indication: hx DVT now w/ RLE ischemia  Allergies  Allergen Reactions  . Aldactone [Spironolactone] Other (See Comments)    Severe hyperkalemia   . Lisinopril Other (See Comments) and Cough    Hypotension also  . Crestor [Rosuvastatin Calcium] Other (See Comments)    Muscle Pain  . Vicodin [Hydrocodone-Acetaminophen] Nausea And Vomiting    Patient Measurements: Height: 5\' 2"  (157.5 cm) Weight: 154 lb 3.2 oz (69.9 kg) IBW/kg (Calculated) : 50.1 Heparin Dosing Weight: 64kg  Vital Signs: Temp: 98.1 F (36.7 C) (12/08 0427) Temp Source: Oral (12/08 0427) BP: 139/52 (12/08 0427) Pulse Rate: 75 (12/08 0738)  Labs:  Recent Labs  09/28/16 0541  09/29/16 0359 09/29/16 1312 09/30/16 0300 09/30/16 0719  HGB 9.1*  --  8.6*  --  8.0*  --   HCT 29.7*  --  27.4*  --  26.2*  --   PLT 355  --  316  --  330  --   LABPROT 16.1*  --  15.7*  --  15.4*  --   INR 1.28  --  1.25  --  1.22  --   HEPARINUNFRC 0.57  < > 0.46 0.54 0.31  --   CREATININE 1.43*  --  1.39*  --   --  1.40*  < > = values in this interval not displayed.  Estimated Creatinine Clearance: 34.2 mL/min (by C-G formula based on SCr of 1.4 mg/dL (H)).   Medications:  Heparin @ 1400 units/hr  Assessment: 70yof on coumadin pta for hx DVT (09/02/16), admitted with RLE ischemia and possible occluded bypass grafts. Coumadin held, INR reversed, and she was started on heparin. S/P RLE aortogram and stent of left external iliac artery 12/6. Heparin to continue in case symptoms worsen and bypass surgery needed. Heparin level is therapeutic 0.31 on the low end. CBC ok.   Goal of Therapy:  Heparin level 0.3-0.7 units/ml Monitor platelets by anticoagulation protocol: Yes   Plan:  1) Increase heparin to 1450 units/hr 2) Daily heparin level and CBC  Fredrik RiggerMarkle, Pier Bosher Sue 09/30/2016,9:04 AM

## 2016-09-30 NOTE — Progress Notes (Signed)
Pt alert and oriented.  VSS. Denies pain at this time.  Pleasant.

## 2016-09-30 NOTE — Progress Notes (Signed)
Occupational Therapy Treatment Patient Details Name: [name] I Staley MRN: [id number] DOB: 02/10/1946 Today's Date: 09/30/2016    History of present illness Pt is a 70 y/o female admitted secondary to AMS, toxic metabolic encephalopathy. PMH including but not limited to chronic trach dependent, CAD, CKD, COPD, PAD, CVA (2013), DM and hx of CABG.    OT comments  Pt making gradual progress toward OT goals this session; agreeable to OOB activity but continues to c/o R foot pain limiting mobility. Pt requires min assist for LB dressing and for stand pivot transfer. D/c plan remains appropriate. Will continue to follow acutely.   Follow Up Recommendations  SNF    Equipment Recommendations  Other (comment) (TBD at next venue)    Recommendations for Other Services      Precautions / Restrictions Precautions Precautions: Fall Precaution Comments: Chronic Trach Restrictions Other Position/Activity Restrictions: Per notes pt to f/u as outpatient for subacute R 5th phalanx fx.         Mobility Bed Mobility Overal bed mobility: Needs Assistance Bed Mobility: Supine to Sit     Supine to sit: Supervision;HOB elevated     General bed mobility comments: Supervision for safety. Pt able to come into full upright sitting EOB without physical assist. HOB elevated with use of bed rail and increased time required.  Transfers Overall transfer level: Needs assistance Equipment used: 1 person hand held assist Transfers: Sit to/from UGI CorporationStand;Stand Pivot Transfers Sit to Stand: Min guard Stand pivot transfers: Min assist       General transfer comment: Min assist for steadying during stand pivot to chair.    Balance Overall balance assessment: Needs assistance Sitting-balance support: No upper extremity supported;Feet unsupported Sitting balance-Leahy Scale: Good     Standing balance support: Bilateral upper extremity supported Standing balance-Leahy Scale: Poor                      ADL Overall ADL's : Needs assistance/impaired                     Lower Body Dressing: Minimal assistance;Sit to/from stand Lower Body Dressing Details (indicate cue type and reason): Assist to doff/don R sock. Pt able to manage L sock without difficulty Toilet Transfer: Minimal assistance;Stand-pivot;BSC Toilet Transfer Details (indicate cue type and reason): Simulated by stand pivot to chair. Min hand held assist to steady.         Functional mobility during ADLs: Minimal assistance (for stand pivot only) General ADL Comments: Pt agreeable to OOB activity today but declined participation in ADL.       Vision                     Perception     Praxis      Cognition   Behavior During Therapy: WFL for tasks assessed/performed Overall Cognitive Status: Within Functional Limits for tasks assessed                       Extremity/Trunk Assessment               Exercises     Shoulder Instructions       General Comments      Pertinent Vitals/ Pain       Pain Assessment: Faces Faces Pain Scale: Hurts little more Pain Location: R foot Pain Descriptors / Indicators: Aching;Guarding;Sore Pain Intervention(s): Monitored during session;Repositioned  Home Living  Prior Functioning/Environment              Frequency  Min 2X/week        Progress Toward Goals  OT Goals(current goals can now be found in the care plan section)  Progress towards OT goals: Progressing toward goals  Acute Rehab OT Goals Patient Stated Goal: decrease R foot pain OT Goal Formulation: With patient  Plan Discharge plan remains appropriate    Co-evaluation                 End of Session Equipment Utilized During Treatment: Oxygen   Activity Tolerance Patient tolerated treatment well   Patient Left in chair;with call bell/phone within reach;with chair alarm set   Nurse Communication           Time: [id number] OT Time Calculation (min): 17 min  Charges: OT General Charges $OT Visit: 1 Procedure OT Treatments $Self Care/Home Management : 8-22 mins  Gaye AlkenBailey A Hilton Saephan M.S., OTR/L Pager: [phone]  09/30/2016, 11:35 AM

## 2016-10-01 LAB — GLUCOSE, CAPILLARY
GLUCOSE-CAPILLARY: 116 mg/dL — AB (ref 65–99)
GLUCOSE-CAPILLARY: 240 mg/dL — AB (ref 65–99)
GLUCOSE-CAPILLARY: 201 mg/dL — AB (ref 65–99)
GLUCOSE-CAPILLARY: 271 mg/dL — AB (ref 65–99)
Glucose-Capillary: 114 mg/dL — ABNORMAL HIGH (ref 65–99)
Glucose-Capillary: 142 mg/dL — ABNORMAL HIGH (ref 65–99)
Glucose-Capillary: 146 mg/dL — ABNORMAL HIGH (ref 65–99)

## 2016-10-01 LAB — CBC
HEMATOCRIT: 26.4 % — AB (ref 36.0–46.0)
HEMOGLOBIN: 8.4 g/dL — AB (ref 12.0–15.0)
MCH: 29.1 pg (ref 26.0–34.0)
MCHC: 31.8 g/dL (ref 30.0–36.0)
MCV: 91.3 fL (ref 78.0–100.0)
Platelets: 337 10*3/uL (ref 150–400)
RBC: 2.89 MIL/uL — ABNORMAL LOW (ref 3.87–5.11)
RDW: 17 % — ABNORMAL HIGH (ref 11.5–15.5)
WBC: 12.5 10*3/uL — ABNORMAL HIGH (ref 4.0–10.5)

## 2016-10-01 LAB — HEPARIN LEVEL (UNFRACTIONATED): Heparin Unfractionated: 0.41 IU/mL (ref 0.30–0.70)

## 2016-10-01 IMAGING — DX DG CHEST 2V
2 series · 2 of 2 positions shown · non-contrast
Comparison: 11/11/2015

CLINICAL DATA: Shortness of breath, congestive heart failure

EXAM:
CHEST  2 VIEW

[chest pa]
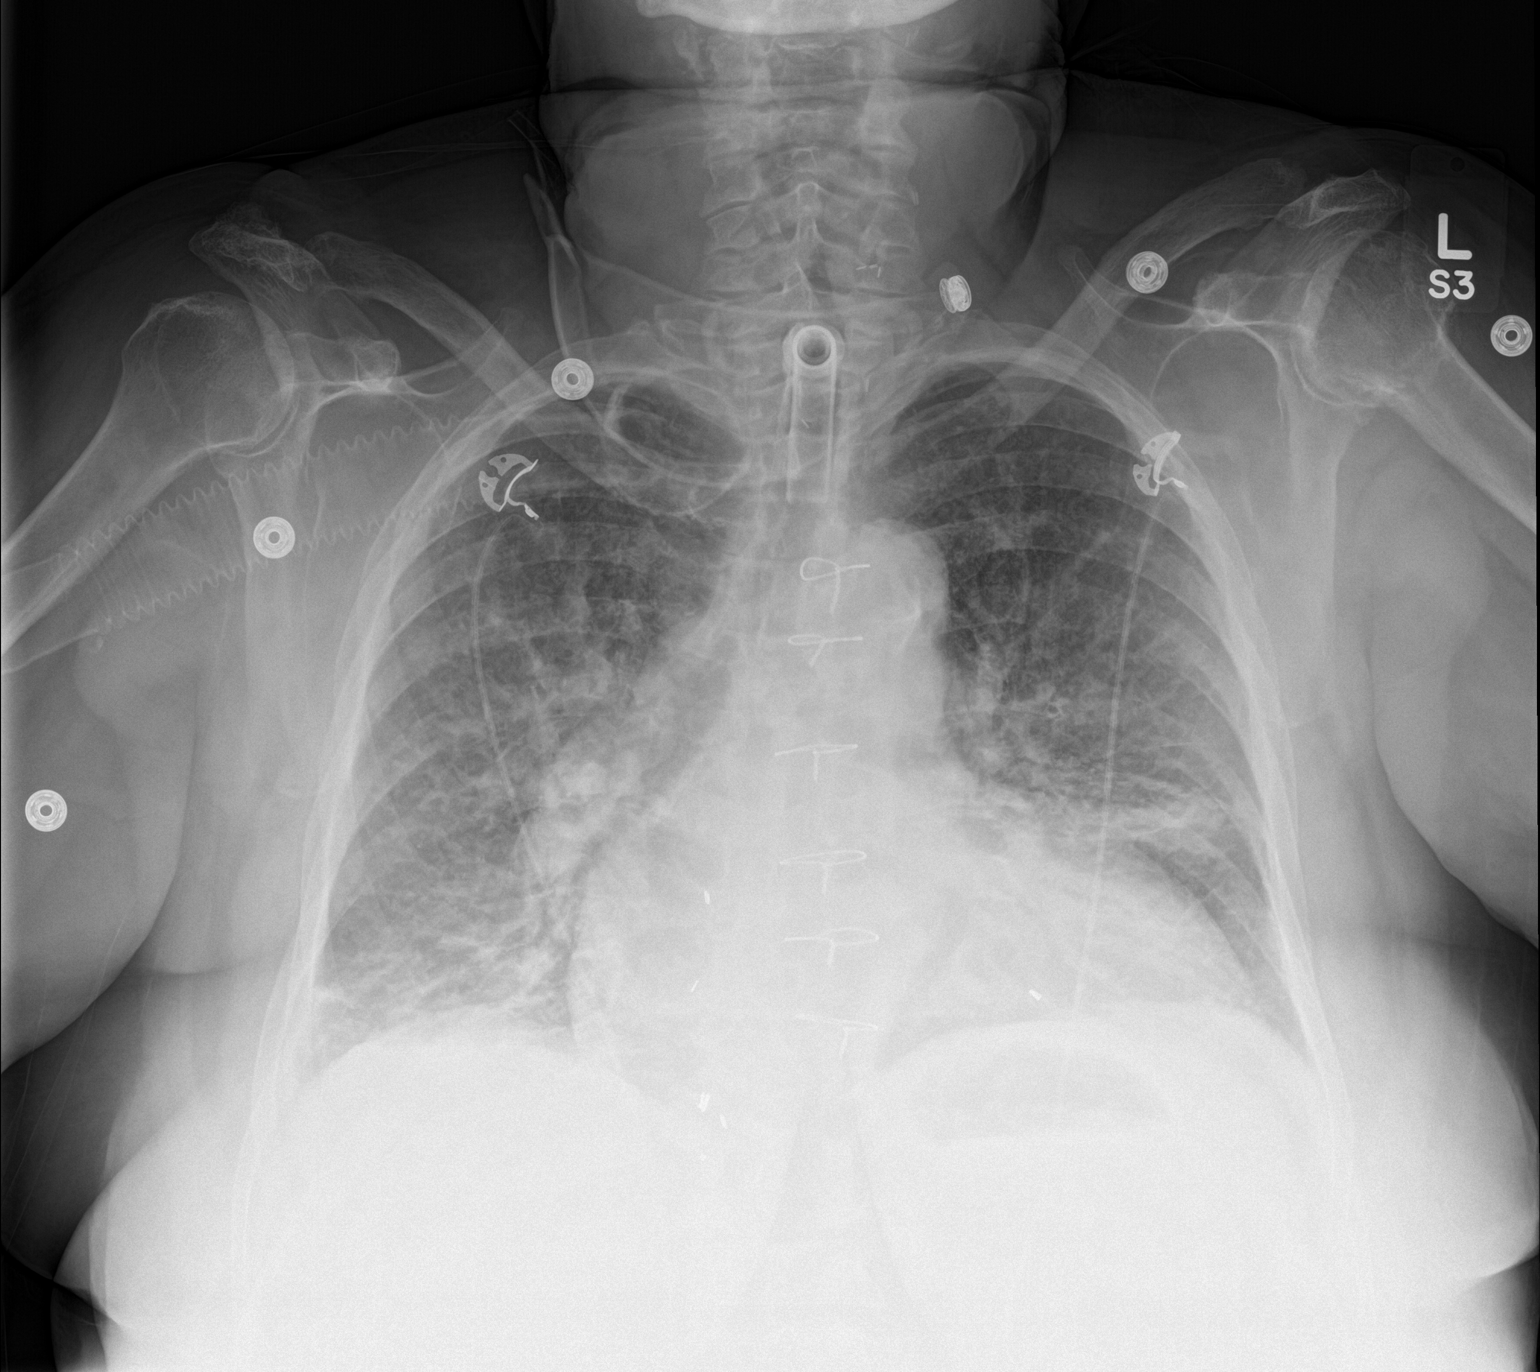

[chest lat]
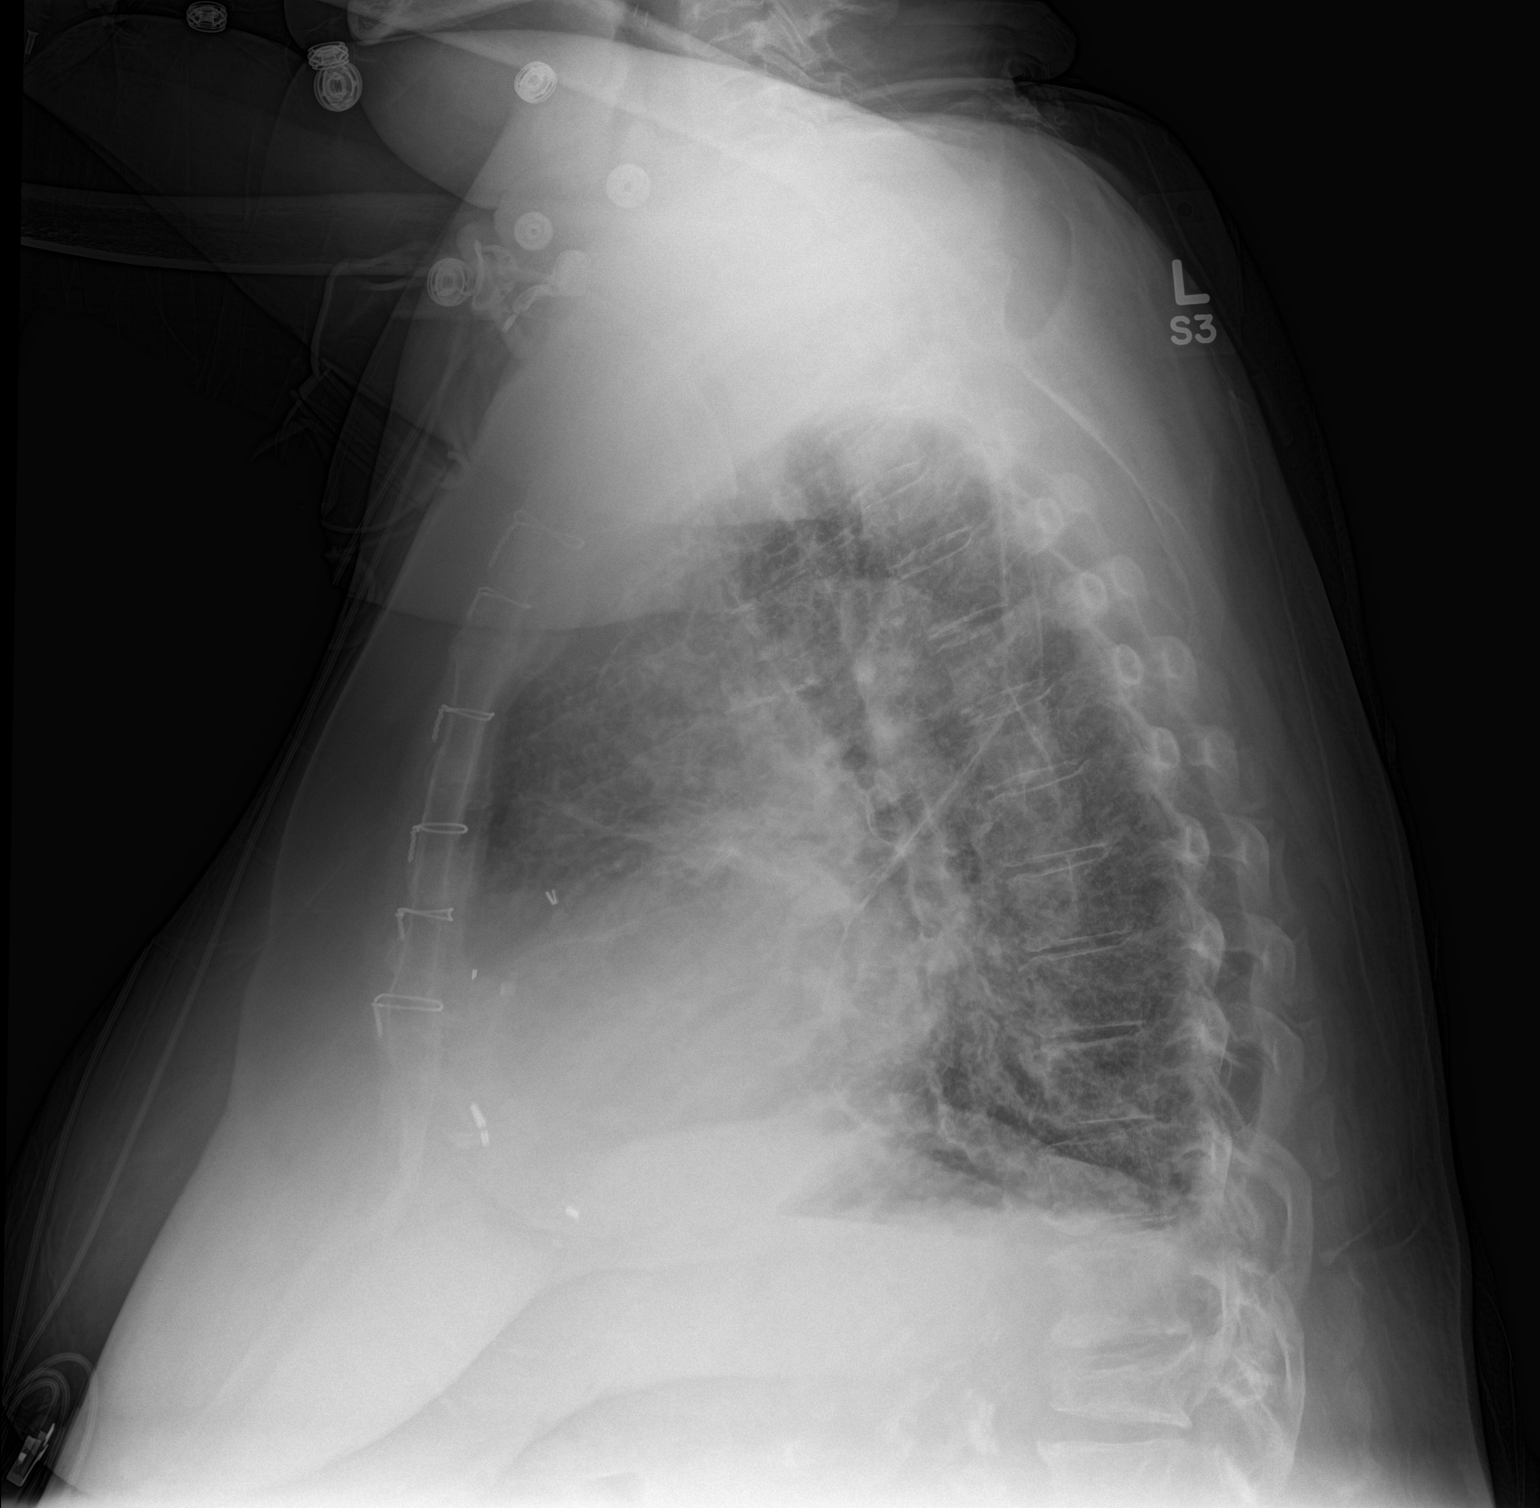

[2 of 2 positions shown; findings below may reference images not displayed]

FINDINGS: Tracheostomy tube is unchanged in position. Central vascular
congestion again noted. Status post median sternotomy. There is mild
interstitial prominence bilateral consistent with mild interstitial
edema with slight improvement from prior exam. Persistent streaky
atelectasis or scarring right base and lingula.
IMPRESSION: There is mild interstitial prominence bilateral consistent with mild
interstitial edema with slight improvement from prior exam.
Persistent streaky atelectasis or scarring right base and lingula.

## 2016-10-01 MED ORDER — MORPHINE SULFATE (PF) 2 MG/ML IV SOLN
1.0000 mg | Freq: Once | INTRAVENOUS | Status: DC
  Filled 2016-10-01: qty 1

## 2016-10-01 NOTE — Progress Notes (Signed)
Verbal order received from Ahmed PrimaElmahi M, MD for one time dose of  morphine 1mg  IV  Will continue to monitor

## 2016-10-01 NOTE — Progress Notes (Signed)
[facility] PROGRESS NOTE  Scot JunDorothy I Barnick FAO:[id number] DOB: 03/17/1946 DOA: 09/19/2016  PCP: [name],CYNTHIA S, MD   Subjective/Interval History: Complaining about pain and her right lower extremity, give her 1 dose of morphine No other complaints, surgery likely on Monday.  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 CBG was noted to be 28. There was also some concern for narcotic overdose. She had good response to Narcan. Patient was hospitalized for further management. Patient's mental status improved with improvement in glucose levels. Then patient started complaining of right foot pain. She has a history of peripheral vascular disease, status post bypass. Concern was about ischemia to the right lower extremity. Doppler study showed limited flow. Vascular surgery was consulted subsequently.  Reason for Visit: Altered mental status  Consultants: Vascular surgery  Procedures:  Right lower extremity arterial Dopplers Right ABIs indicate a severe reduction in arterial flow at rest. Unable to obtain all pressures due to venous signals overriding the arterial signals. Left ABI indicates a moderate reduction in arterial flow at rest.  Antibiotics: Vantin  ROS: Denies chest pain or shortness of breath. No nausea or vomiting.  Objective:  Vital Signs  Vitals:   10/01/16 0438 10/01/16 0440 10/01/16 0833 10/01/16 0902  BP: (!) 143/65  (!) 124/55   Pulse: 83 77 82 84  Resp: 18 18  18   Temp: 98.9 F (37.2 C)     TempSrc: Oral     SpO2: 100% 97% 100% 95%  Weight: 74.8 kg (165 lb)     Height:        Intake/Output Summary (Last 24 hours) at 10/01/16  1133 Last data filed at 10/01/16 0900  Gross per 24 hour  Intake           912.74 ml  Output              450 ml  Net           462.74 ml   Filed Weights   09/29/16 0416 09/30/16 0427 10/01/16 0438  Weight: 57.7 kg (127 lb 1.6 oz) 69.9 kg (154 lb 3.2 oz) 74.8 kg (165 lb)    General appearance: alert, cooperative, appears stated age and no distress Tracheostomy Resp: clear to auscultation bilaterally Cardio: regular rate and rhythm, S1, S2 normal, no murmur, click, rub or gallop GI: soft, non-tender; bowel sounds normal; no masses,  no organomegaly Extremities: Right foot is cold to touch Neurologic: Awake and alert. Oriented 3.  Lab Results:  Data Reviewed: I have personally reviewed following labs and imaging studies  CBC:  Recent Labs Lab 09/27/16 0509 09/28/16 0541 09/29/16 0359 09/30/16 0300 10/01/16 0316  WBC 11.6* 11.3* 12.2* 13.0* 12.5*  HGB 8.9* 9.1* 8.6* 8.0* 8.4*  HCT 29.1* 29.7* 27.4* 26.2* 26.4*  MCV 92.7 91.7 91.6 91.3 91.3  PLT 331 355 316 330 337    Basic Metabolic Panel:  Recent Labs Lab 09/25/16 1553 09/26/16 0322 09/27/16 0509 09/28/16 0541 09/29/16 0359 09/30/16 0719  NA 137 136 136 138 139 137  K 4.6 4.1 4.2 4.5 4.3 4.0  CL 98* 98* 97* 97* 99* 97*  CO2 30 30 29 29 29  33*  GLUCOSE 312* 161* 222* 141* 133* 142*  BUN 33* 37*  36* 37* 34* 35*  CREATININE 1.80* 1.72* 1.51* 1.43* 1.39* 1.40*  CALCIUM 8.3* 8.4* 8.5* 8.8* 8.7* 8.8*  MG 2.1  --   --   --   --   --     GFR: Estimated Creatinine Clearance: 35.4 mL/min (by C-G formula based on SCr of 1.4 mg/dL (H)).  Liver Function Tests: No results for input(s): AST, ALT, ALKPHOS, BILITOT, PROT, ALBUMIN in the last 168 hours.  Coagulation Profile:  Recent Labs Lab 09/26/16 0322 09/27/16 0509 09/28/16 0541 09/29/16 0359 09/30/16 0300  INR 1.50 1.43 1.28 1.25 1.22    CBG:  Recent Labs Lab 09/30/16 2012 10/01/16 0111 10/01/16 0417 10/01/16 0750 10/01/16 1120  GLUCAP 285*  114* 142* 116* 240*     No results found for this or any previous visit (from the past 240 hour(s)).    Radiology Studies: No results found.   Medications:  Scheduled: . aspirin EC  81 mg Oral Daily  . budesonide  0.25 mg Nebulization BID  . carvedilol  12.5 mg Oral BID WC  . [START ON 10/03/2016] cefUROXime (ZINACEF)  IV  1.5 g Intravenous to SS-Proc  . dicyclomine  20 mg Oral TID AC  . DULoxetine  60 mg Oral QHS  . ferrous sulfate  325 mg Oral Q breakfast  . gabapentin  300 mg Oral QHS  . hydrALAZINE  25 mg Oral Q8H  . insulin aspart  0-15 Units Subcutaneous Q4H  . insulin glargine  5 Units Subcutaneous Daily  . isosorbide mononitrate  15 mg Oral Daily  . levothyroxine  75 mcg Oral QAC breakfast  .  morphine injection  1 mg Intravenous Once  . multivitamin with minerals  1 tablet Oral Daily  . pantoprazole  40 mg Oral Daily  . simvastatin  40 mg Oral QPM  . tiZANidine  2 mg Oral Daily  . torsemide  40 mg Oral Daily   Continuous: . heparin 1,450 Units/hr (10/01/16 0015)   ZOX:[id number]:acetaminophen, albuterol, ipratropium-albuterol, ondansetron (ZOFRAN) IV, oxyCODONE-acetaminophen, polyethylene glycol, senna-docusate, traMADol  Assessment/Plan:  Principal Problem:   Toxic metabolic encephalopathy Active Problems:   Chronic combined systolic and diastolic CHF, NYHA class 2 (EF 30-35%) per ECHO 2017   Hypertension   Chronic respiratory failure with hypoxia (HCC)   Hypoglycemia   Altered mental status   Uncontrolled type 2 diabetes mellitus with complication (HCC)   Essential hypertension   Acute deep vein thrombosis (DVT) of distal end of right lower extremity (HCC)    Right foot pain/Ischemic pain -Arterial Dopplers show severely limited circulation in the right.  -S/p vascular surgery to that lower extremity in the past and concern is for occlusion of her old grafts.  -Arteriogram done On 09/28/2016 showed occlusion of the right external iliac artery, a stent placed  on the left. -Right femoroperoneal bypass on Monday 12/11  RLE DVT -Warfarin discontinued due to need for angiogram of the lower extremity. Currently on IV heparin. INR is less than 2.  -She had to be given 2.5 mg of vitamin K on 12/2.  -Currently on heparin drip -Likely can start Eliquis on discharge.  Acute Metabolic Encephalopathy -Etiology is thought to be multifactorial including hypoglycemia and UTI. -Opioid overdose is unlikely as patient is an skilled nursing facility. -Somnolence observed on 12/7 AM resolved.  Diabetes type 2 uncontrolled with complications/Hypoglycemia Patient is on long acting insulin at her skilled nursing facility. She did have evidence for hypoglycemia at presentation and also in the hospital. Patient denies any  recent changes to her medication dosage. HbA1c is 8.7. CBGs stabilized. She was started on Lantus at lower dose. CBGs are well controlled at this time. Will need to lower her dose of Tresiba at the time of discharge.   Sepsis secondary to UTI Now resolved. Patient was noted to have leukocytosis, however without fever. Urine culture is again growing Klebsiella. She grew the same bacteria on November 15. Based on discharge summary it appears that she was treated with Keflex. Sensitivities are available. On Vantin for now. Also, one out of 2 blood cultures is growing strep viridans. This is most likely a contaminant.  Bacteremia with strep viridans 1/2 blood cultures positive for. This is most likely a contaminant. Patient has not had any fevers.   Urinary tract infection with Klebsiella Continue Vantin. Will need at least 7 days of treatment. End date will be 09/27/16.  Essential hypertension  Stable. Continue home medications.  Chronic Systolic and Diastolic CHF Compensated. Continue home medications.  Pulmonary hypertension Stable  Chronic respiratory failure (Chronic Tracheostomy) Stable, trach care, mucous plug removed on 12/7 AM by  nursing staff.  One Episode of missed beat overnight Patient was asymptomatic. Electrolytes are within normal range. Continue to monitor.   DVT Prophylaxis: On heparin drip, switch to Eliquis on discharge if renal function allows Code Status: Full code  Family Communication: Discussed with the patient  Disposition Plan: Will await the outcome of her femoroperoneal bypass surgery    LOS: 11 days   [facility] A  [facility] Pager [phone] 10/01/2016, 11:33 AM  If 7PM-7AM, please contact night-coverage at [email or web address], password [email or web address]

## 2016-10-01 NOTE — Progress Notes (Signed)
ANTICOAGULATION CONSULT NOTE - Follow Up Consult  Pharmacy Consult for Heparin Indication: hx DVT now w/ RLE ischemia  Allergies  Allergen Reactions  . Aldactone [Spironolactone] Other (See Comments)    Severe hyperkalemia   . Lisinopril Other (See Comments) and Cough    Hypotension also  . Crestor [Rosuvastatin Calcium] Other (See Comments)    Muscle Pain  . Vicodin [Hydrocodone-Acetaminophen] Nausea And Vomiting    Patient Measurements: Height: 5\' 2"  (157.5 cm) Weight: 165 lb (74.8 kg) IBW/kg (Calculated) : 50.1 Heparin Dosing Weight: 64kg  Vital Signs: Temp: 98.9 F (37.2 C) (12/09 0438) Temp Source: Oral (12/09 0438) BP: 124/55 (12/09 0833) Pulse Rate: 84 (12/09 0902)  Labs:  Recent Labs  09/29/16 0359 09/29/16 1312 09/30/16 0300 09/30/16 0719 10/01/16 0316  HGB 8.6*  --  8.0*  --  8.4*  HCT 27.4*  --  26.2*  --  26.4*  PLT 316  --  330  --  337  LABPROT 15.7*  --  15.4*  --   --   INR 1.25  --  1.22  --   --   HEPARINUNFRC 0.46 0.54 0.31  --  0.41  CREATININE 1.39*  --   --  1.40*  --     Estimated Creatinine Clearance: 35.4 mL/min (by C-G formula based on SCr of 1.4 mg/dL (H)).   Medications:  Heparin @ 1450 units/hr  Assessment: 70yof on coumadin pta for hx DVT (09/02/16), admitted with RLE ischemia and possible occluded bypass grafts. Coumadin held, INR reversed, and she was started on heparin. S/P RLE aortogram and stent of left external iliac artery 12/6. Heparin to continue in case symptoms worsen and bypass surgery needed - planning right fem peroneal bypass 12/11. Heparin level is therapeutic 0.41. CBC stable.   Goal of Therapy:  Heparin level 0.3-0.7 units/ml Monitor platelets by anticoagulation protocol: Yes   Plan:  1) Continue heparin at 1450 units/hr 2) Daily heparin level and CBC  Fredrik RiggerMarkle, Lochlin Eppinger Sue 10/01/2016,1:06 PM

## 2016-10-02 LAB — CBC
HCT: 26.3 % — ABNORMAL LOW (ref 36.0–46.0)
Hemoglobin: 8.1 g/dL — ABNORMAL LOW (ref 12.0–15.0)
MCH: 28.4 pg (ref 26.0–34.0)
MCHC: 30.8 g/dL (ref 30.0–36.0)
MCV: 92.3 fL (ref 78.0–100.0)
PLATELETS: 316 10*3/uL (ref 150–400)
RBC: 2.85 MIL/uL — ABNORMAL LOW (ref 3.87–5.11)
RDW: 17.3 % — AB (ref 11.5–15.5)
WBC: 13 10*3/uL — AB (ref 4.0–10.5)

## 2016-10-02 LAB — BASIC METABOLIC PANEL
Anion gap: 9 (ref 5–15)
BUN: 31 mg/dL — AB (ref 6–20)
CALCIUM: 8.9 mg/dL (ref 8.9–10.3)
CO2: 32 mmol/L (ref 22–32)
CREATININE: 1.52 mg/dL — AB (ref 0.44–1.00)
Chloride: 95 mmol/L — ABNORMAL LOW (ref 101–111)
GFR calc non Af Amer: 34 mL/min — ABNORMAL LOW (ref >60)
GFR, EST AFRICAN AMERICAN: 39 mL/min — AB (ref >60)
Glucose, Bld: 203 mg/dL — ABNORMAL HIGH (ref 65–99)
Potassium: 4.8 mmol/L (ref 3.5–5.1)
Sodium: 136 mmol/L (ref 135–145)

## 2016-10-02 LAB — GLUCOSE, CAPILLARY
GLUCOSE-CAPILLARY: 200 mg/dL — AB (ref 65–99)
GLUCOSE-CAPILLARY: 151 mg/dL — AB (ref 65–99)
GLUCOSE-CAPILLARY: 301 mg/dL — AB (ref 65–99)
Glucose-Capillary: 203 mg/dL — ABNORMAL HIGH (ref 65–99)
Glucose-Capillary: 172 mg/dL — ABNORMAL HIGH (ref 65–99)
Glucose-Capillary: 306 mg/dL — ABNORMAL HIGH (ref 65–99)

## 2016-10-02 LAB — HEPARIN LEVEL (UNFRACTIONATED): Heparin Unfractionated: 0.42 [IU]/mL (ref 0.30–0.70)

## 2016-10-02 MED ORDER — HEPARIN (PORCINE) IN NACL 100-0.45 UNIT/ML-% IJ SOLN
1450.0000 [IU]/h | INTRAMUSCULAR | Status: AC
  Administered 2016-10-02: 1450 [IU]/h via INTRAVENOUS
  Filled 2016-10-02: qty 250

## 2016-10-02 NOTE — Progress Notes (Signed)
ANTICOAGULATION CONSULT NOTE - Follow Up Consult  Pharmacy Consult for Heparin Indication: hx DVT now w/ RLE ischemia  Allergies  Allergen Reactions  . Aldactone [Spironolactone] Other (See Comments)    Severe hyperkalemia   . Lisinopril Other (See Comments) and Cough    Hypotension also  . Crestor [Rosuvastatin Calcium] Other (See Comments)    Muscle Pain  . Vicodin [Hydrocodone-Acetaminophen] Nausea And Vomiting    Patient Measurements: Height: 5\' 2"  (157.5 cm) Weight: 165 lb (74.8 kg) IBW/kg (Calculated) : 50.1 Heparin Dosing Weight: 64kg  Vital Signs: Temp: 98.8 F (37.1 C) (12/10 0619) Temp Source: Oral (12/10 0619) BP: 130/57 (12/10 0619) Pulse Rate: 89 (12/10 0619)  Labs:  Recent Labs  09/30/16 0300 09/30/16 0719 10/01/16 0316 10/02/16 0239  HGB 8.0*  --  8.4* 8.1*  HCT 26.2*  --  26.4* 26.3*  PLT 330  --  337 316  LABPROT 15.4*  --   --   --   INR 1.22  --   --   --   HEPARINUNFRC 0.31  --  0.41 0.42  CREATININE  --  1.40*  --  1.52*    Estimated Creatinine Clearance: 32.6 mL/min (by C-G formula based on SCr of 1.52 mg/dL (H)).   Medications:  Heparin @ 1450 units/hr  Assessment: 70yof on coumadin pta for hx DVT (09/02/16), admitted with RLE ischemia and possible occluded bypass grafts. Coumadin held, INR reversed, and she was started on heparin. S/P RLE aortogram and stent of left external iliac artery 12/6. Heparin to continue in case symptoms worsen and bypass surgery needed - planning right fem peroneal bypass 12/11. Heparin level is therapeutic 0.42. CBC stable.   Goal of Therapy:  Heparin level 0.3-0.7 units/ml Monitor platelets by anticoagulation protocol: Yes   Plan:  1) Continue heparin at 1450 units/hr - stop date added for 12/11 ~ 0100 (6 hrs prior to surgery) 2) [facility] tomorrow  Fredrik RiggerMarkle, Noach Calvillo Sue 10/02/2016,10:33 AM

## 2016-10-02 NOTE — Progress Notes (Addendum)
    Subjective  -   No complaints   Physical Exam:  Right foot stable Breathing OK abd soft CV:  RRR      Assessment/Plan:    Con't heparin gtt Plan for bypass tomorrow with Dr. Fields NPO after midnight  Tifani Dack, Wells 10/02/2016 12:58 PM --  Vitals:   10/02/16 0440 10/02/16 0619  BP:  (!) 130/57  Pulse: 84 89  Resp: 18 18  Temp:  98.8 F (37.1 C)    Intake/Output Summary (Last 24 hours) at 10/02/16 1258 Last data filed at 10/01/16 1700  Gross per 24 hour  Intake              480 ml  Output                0 ml  Net              480 ml     Laboratory CBC    Component Value Date/Time   WBC 13.0 (H) 10/02/2016 0239   HGB 8.1 (L) 10/02/2016 0239   HCT 26.3 (L) 10/02/2016 0239   PLT 316 10/02/2016 0239    BMET    Component Value Date/Time   NA 136 10/02/2016 0239   K 4.8 10/02/2016 0239   CL 95 (L) 10/02/2016 0239   CO2 32 10/02/2016 0239   GLUCOSE 203 (H) 10/02/2016 0239   BUN 31 (H) 10/02/2016 0239   CREATININE 1.52 (H) 10/02/2016 0239   CREATININE 1.59 (H) 05/27/2016 1020   CALCIUM 8.9 10/02/2016 0239   GFRNONAA 34 (L) 10/02/2016 0239   GFRAA 39 (L) 10/02/2016 0239    COAG Lab Results  Component Value Date   INR 1.22 09/30/2016   INR 1.25 09/29/2016   INR 1.28 09/28/2016   No results found for: PTT  Antibiotics Anti-infectives    Start     Dose/Rate Route Frequency Ordered Stop   10/03/16 0800  cefUROXime (ZINACEF) 1.5 g in dextrose 5 % 50 mL IVPB     1.5 g 100 mL/hr over 30 Minutes Intravenous To ShortStay Procedural 09/30/16 1040 10/04/16 0800   09/22/16 1200  cefpodoxime (VANTIN) tablet 200 mg     200 mg Oral Every 12 hours 09/22/16 1050 09/28/16 0828   09/22/16 0000  cefpodoxime (VANTIN) 200 MG tablet     200 mg Oral Every 12 hours 09/22/16 1053     09/21/16 0600  cefTRIAXone (ROCEPHIN) 2 g in dextrose 5 % 50 mL IVPB  Status:  Discontinued     2 g 100 mL/hr over 30 Minutes Intravenous Every 24 hours 09/21/16 0511  09/22/16 1050       V. Wells Tieasha Larsen IV, M.D. [facility] Office: [phone] Pager:  [phone]  

## 2016-10-02 NOTE — Progress Notes (Signed)
[facility] PROGRESS NOTE  [name] GNF:[id number] DOB: [birth year] DOA: 09/19/2016  PCP: Cala Bradford, MD   Subjective/Interval History: No complaints today pain in the foot is more controlled. Seen while she was eating her breakfast, surgery scheduled for tomorrow.  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 CBG was noted to be 28. There was also some concern for narcotic overdose. She had good response to Narcan. Patient was hospitalized for further management. Patient's mental status improved with improvement in glucose levels. Then patient started complaining of right foot pain. She has a history of peripheral vascular disease, status post bypass. Concern was about ischemia to the right lower extremity. Doppler study showed limited flow. Vascular surgery was consulted subsequently.  Reason for Visit: Altered mental status  Consultants: Vascular surgery  Procedures:  Right lower extremity arterial Dopplers Right ABIs indicate a severe reduction in arterial flow at rest. Unable to obtain all pressures due to venous signals overriding the arterial signals. Left ABI indicates a moderate reduction in arterial flow at rest.  Antibiotics: Vantin  ROS: Denies chest pain or shortness of breath. No nausea or vomiting.  Objective:  Vital Signs  Vitals:   10/02/16 0015 10/02/16 0440 10/02/16 0619 10/02/16 0901  BP:   (!) 130/57   Pulse: 86 84 89   Resp: 16 18 18    Temp:   98.8 F (37.1 C)   TempSrc:   Oral   SpO2:   (!) 86% 92%  Weight:      Height:        Intake/Output Summary (Last 24 hours) at 10/02/16 1023 Last data filed at 10/01/16  1700  Gross per 24 hour  Intake              480 ml  Output                0 ml  Net              480 ml   Filed Weights   09/29/16 0416 09/30/16 0427 10/01/16 0438  Weight: 57.7 kg (127 lb 1.6 oz) 69.9 kg (154 lb 3.2 oz) 74.8 kg (165 lb)    General appearance: alert, cooperative, appears stated age and no distress Tracheostomy Resp: clear to auscultation bilaterally Cardio: regular rate and rhythm, S1, S2 normal, no murmur, click, rub or gallop GI: soft, non-tender; bowel sounds normal; no masses,  no organomegaly Extremities: Right foot is cold to touch Neurologic: Awake and alert. Oriented 3.  Lab Results:  Data Reviewed: I have personally reviewed following labs and imaging studies  CBC:  Recent Labs Lab 09/28/16 0541 09/29/16 0359 09/30/16 0300 10/01/16 0316 10/02/16 0239  WBC 11.3* 12.2* 13.0* 12.5* 13.0*  HGB 9.1* 8.6* 8.0* 8.4* 8.1*  HCT 29.7* 27.4* 26.2* 26.4* 26.3*  MCV 91.7 91.6 91.3 91.3 92.3  PLT 355 316 330 337 316    Basic Metabolic Panel:  Recent Labs Lab 09/25/16 1553  09/27/16 0509 09/28/16 0541 09/29/16 0359 09/30/16 0719 10/02/16 0239  NA 137  < > 136 138 139 137 136  K 4.6  < > 4.2 4.5 4.3 4.0 4.8  CL 98*  < > 97* 97* 99* 97* 95*  CO2 30  < >  29 29 29  33* 32  GLUCOSE 312*  < > 222* 141* 133* 142* 203*  BUN 33*  < > 36* 37* 34* 35* 31*  CREATININE 1.80*  < > 1.51* 1.43* 1.39* 1.40* 1.52*  CALCIUM 8.3*  < > 8.5* 8.8* 8.7* 8.8* 8.9  MG 2.1  --   --   --   --   --   --   < > = values in this interval not displayed.  GFR: Estimated Creatinine Clearance: 32.6 mL/min (by C-G formula based on SCr of 1.52 mg/dL (H)).  Liver Function Tests: No results for input(s): AST, ALT, ALKPHOS, BILITOT, PROT, ALBUMIN in the last 168 hours.  Coagulation Profile:  Recent Labs Lab 09/26/16 0322 09/27/16 0509 09/28/16 0541 09/29/16 0359 09/30/16 0300  INR 1.50 1.43 1.28 1.25 1.22    CBG:  Recent Labs Lab 10/01/16 2009 10/01/16 2356  10/02/16 0557 10/02/16 0614 10/02/16 0815  GLUCAP 271* 146* 203* 200* 172*     No results found for this or any previous visit (from the past 240 hour(s)).    Radiology Studies: No results found.   Medications:  Scheduled: . aspirin EC  81 mg Oral Daily  . budesonide  0.25 mg Nebulization BID  . carvedilol  12.5 mg Oral BID WC  . [START ON 10/03/2016] cefUROXime (ZINACEF)  IV  1.5 g Intravenous to SS-Proc  . dicyclomine  20 mg Oral TID AC  . DULoxetine  60 mg Oral QHS  . ferrous sulfate  325 mg Oral Q breakfast  . gabapentin  300 mg Oral QHS  . hydrALAZINE  25 mg Oral Q8H  . insulin aspart  0-15 Units Subcutaneous Q4H  . insulin glargine  5 Units Subcutaneous Daily  . isosorbide mononitrate  15 mg Oral Daily  . levothyroxine  75 mcg Oral QAC breakfast  .  morphine injection  1 mg Intravenous Once  . multivitamin with minerals  1 tablet Oral Daily  . pantoprazole  40 mg Oral Daily  . simvastatin  40 mg Oral QPM  . tiZANidine  2 mg Oral Daily  . torsemide  40 mg Oral Daily   Continuous: . heparin 1,450 Units/hr (10/01/16 2047)   WGN:[id number]:acetaminophen, albuterol, ipratropium-albuterol, ondansetron (ZOFRAN) IV, oxyCODONE-acetaminophen, polyethylene glycol, senna-docusate, traMADol  Assessment/Plan:  Principal Problem:   Toxic metabolic encephalopathy Active Problems:   Chronic combined systolic and diastolic CHF, NYHA class 2 (EF 30-35%) per ECHO 2017   Hypertension   Chronic respiratory failure with hypoxia (HCC)   Hypoglycemia   Altered mental status   Uncontrolled type 2 diabetes mellitus with complication (HCC)   Essential hypertension   Acute deep vein thrombosis (DVT) of distal end of right lower extremity (HCC)    Right foot pain/Ischemic pain -Arterial Dopplers show severely limited circulation in the right.  -S/p vascular surgery to that lower extremity in the past and concern is for occlusion of her old grafts.  -Arteriogram done On 09/28/2016 showed  occlusion of the right external iliac artery, a stent placed on the left. -Right femoroperoneal bypass on Monday 12/11. -No changes clinically, await surgery, check BMP and CBC in a.m.  RLE DVT -Warfarin discontinued due to need for angiogram of the lower extremity. Currently on IV heparin. INR is less than 2.  -She had to be given 2.5 mg of vitamin K on 12/2.  -Currently on heparin drip -Likely can start Eliquis on discharge.  Acute Metabolic Encephalopathy -Etiology is thought to be multifactorial including hypoglycemia and UTI. -  Opioid overdose is unlikely as patient is an skilled nursing facility. -Somnolence observed on 12/7 AM resolved.  Diabetes type 2 uncontrolled with complications/Hypoglycemia Patient is on long acting insulin at her skilled nursing facility. She did have evidence for hypoglycemia at presentation and also in the hospital. Patient denies any recent changes to her medication dosage. HbA1c is 8.7. CBGs stabilized. She was started on Lantus at lower dose. CBGs are well controlled at this time. Will need to lower her dose of Tresiba at the time of discharge.   Sepsis secondary to UTI Now resolved. Patient was noted to have leukocytosis, however without fever. Urine culture is again growing Klebsiella. She grew the same bacteria on November 15. Based on discharge summary it appears that she was treated with Keflex. Sensitivities are available. On Vantin for now. Also, one out of 2 blood cultures is growing strep viridans. This is most likely a contaminant.  Bacteremia with strep viridans 1/2 blood cultures positive for. This is most likely a contaminant. Patient has not had any fevers.   Urinary tract infection with Klebsiella Continue Vantin. Will need at least 7 days of treatment. End date will be 09/27/16.  Essential hypertension  Stable. Continue home medications.  Chronic Systolic and Diastolic CHF Compensated. Continue home medications.  Pulmonary  hypertension Stable  Chronic respiratory failure (Chronic Tracheostomy) Stable, trach care, mucous plug removed on 12/7 AM by nursing staff.  One Episode of missed beat overnight Patient was asymptomatic. Electrolytes are within normal range. Continue to monitor.   DVT Prophylaxis: On heparin drip, switch to Eliquis on discharge if renal function allows Code Status: Full code  Family Communication: Discussed with the patient  Disposition Plan: Will await the outcome of her femoroperoneal bypass surgery    LOS: 12 days   [facility] TampaELMAHI,Kaylise Blakeley A  [facility] Pager [phone] 10/02/2016, 10:23 AM  If 7PM-7AM, please contact night-coverage at [email or web address], password [email or web address]

## 2016-10-03 ENCOUNTER — Inpatient Hospital Stay (HOSPITAL_COMMUNITY): Payer: Medicare Other | Admitting: Anesthesiology

## 2016-10-03 ENCOUNTER — Encounter (HOSPITAL_COMMUNITY): Payer: Self-pay | Admitting: Certified Registered Nurse Anesthetist

## 2016-10-03 ENCOUNTER — Encounter (HOSPITAL_COMMUNITY)
Admission: EM | Disposition: A | Discharge status: Skilled Nursing Facility | Payer: Self-pay | Source: Home / Self Care | Attending: Internal Medicine

## 2016-10-03 HISTORY — PX: BYPASS GRAFT FEMORAL-PERONEAL: SHX5762

## 2016-10-03 HISTORY — PX: ENDARTERECTOMY FEMORAL: SHX5804

## 2016-10-03 LAB — BASIC METABOLIC PANEL
ANION GAP: 10 (ref 5–15)
ANION GAP: 8 (ref 5–15)
BUN: 29 mg/dL — ABNORMAL HIGH (ref 6–20)
BUN: 25 mg/dL — AB (ref 6–20)
CHLORIDE: 96 mmol/L — AB (ref 101–111)
CO2: 32 mmol/L (ref 22–32)
CO2: 28 mmol/L (ref 22–32)
Calcium: 9.1 mg/dL (ref 8.9–10.3)
Calcium: 8 mg/dL — ABNORMAL LOW (ref 8.9–10.3)
Chloride: 101 mmol/L (ref 101–111)
Creatinine, Ser: 1.27 mg/dL — ABNORMAL HIGH (ref 0.44–1.00)
Creatinine, Ser: 1.27 mg/dL — ABNORMAL HIGH (ref 0.44–1.00)
GFR, EST AFRICAN AMERICAN: 48 mL/min — AB (ref >60)
GFR, EST AFRICAN AMERICAN: 48 mL/min — AB (ref >60)
GFR, EST NON AFRICAN AMERICAN: 42 mL/min — AB (ref >60)
GFR, EST NON AFRICAN AMERICAN: 42 mL/min — AB (ref >60)
Glucose, Bld: 96 mg/dL (ref 65–99)
Glucose, Bld: 157 mg/dL — ABNORMAL HIGH (ref 65–99)
POTASSIUM: 4.5 mmol/L (ref 3.5–5.1)
POTASSIUM: 5.2 mmol/L — AB (ref 3.5–5.1)
SODIUM: 138 mmol/L (ref 135–145)
SODIUM: 137 mmol/L (ref 135–145)

## 2016-10-03 LAB — CBC
HCT: 25.4 % — ABNORMAL LOW (ref 36.0–46.0)
HCT: 26.2 % — ABNORMAL LOW (ref 36.0–46.0)
HEMOGLOBIN: 8.5 g/dL — AB (ref 12.0–15.0)
Hemoglobin: 7.9 g/dL — ABNORMAL LOW (ref 12.0–15.0)
MCH: 28.6 pg (ref 26.0–34.0)
MCH: 28.2 pg (ref 26.0–34.0)
MCHC: 31.1 g/dL (ref 30.0–36.0)
MCHC: 32.4 g/dL (ref 30.0–36.0)
MCV: 92 fL (ref 78.0–100.0)
MCV: 87 fL (ref 78.0–100.0)
PLATELETS: 316 10*3/uL (ref 150–400)
PLATELETS: 247 10*3/uL (ref 150–400)
RBC: 2.76 MIL/uL — ABNORMAL LOW (ref 3.87–5.11)
RBC: 3.01 MIL/uL — AB (ref 3.87–5.11)
RDW: 17.1 % — AB (ref 11.5–15.5)
RDW: 17.5 % — ABNORMAL HIGH (ref 11.5–15.5)
WBC: 12 10*3/uL — AB (ref 4.0–10.5)
WBC: 17.9 10*3/uL — ABNORMAL HIGH (ref 4.0–10.5)

## 2016-10-03 LAB — PREPARE RBC (CROSSMATCH)

## 2016-10-03 LAB — GLUCOSE, CAPILLARY
GLUCOSE-CAPILLARY: 142 mg/dL — AB (ref 65–99)
GLUCOSE-CAPILLARY: 161 mg/dL — AB (ref 65–99)
GLUCOSE-CAPILLARY: 222 mg/dL — AB (ref 65–99)
Glucose-Capillary: 92 mg/dL (ref 65–99)
Glucose-Capillary: 270 mg/dL — ABNORMAL HIGH (ref 65–99)

## 2016-10-03 SURGERY — CREATION, BYPASS, ARTERIAL, FEMORAL TO PERONEAL, USING GRAFT
Anesthesia: General | Site: Groin | Laterality: Right

## 2016-10-03 MED ORDER — PROTAMINE SULFATE 10 MG/ML IV SOLN
INTRAVENOUS | Status: AC
  Filled 2016-10-03: qty 5

## 2016-10-03 MED ORDER — METOPROLOL TARTRATE 5 MG/5ML IV SOLN: 2.0000 mg | INTRAVENOUS | Status: DC | PRN

## 2016-10-03 MED ORDER — SODIUM CHLORIDE 0.9 % IV SOLN
INTRAVENOUS | Status: DC
  Administered 2016-10-03 – 2016-10-05 (×3): via INTRAVENOUS

## 2016-10-03 MED ORDER — SUGAMMADEX SODIUM 200 MG/2ML IV SOLN
INTRAVENOUS | Status: DC | PRN
  Administered 2016-10-03: 150 mg via INTRAVENOUS

## 2016-10-03 MED ORDER — MORPHINE SULFATE (PF) 2 MG/ML IV SOLN
2.0000 mg | INTRAVENOUS | Status: DC | PRN
  Administered 2016-10-03 – 2016-10-05 (×3): 2 mg via INTRAVENOUS
  Filled 2016-10-03 (×3): qty 1

## 2016-10-03 MED ORDER — HEPARIN SODIUM (PORCINE) 5000 UNIT/ML IJ SOLN
INTRAMUSCULAR | Status: DC | PRN
  Administered 2016-10-03: 10:00:00

## 2016-10-03 MED ORDER — ALBUMIN HUMAN 5 % IV SOLN
INTRAVENOUS | Status: AC
  Administered 2016-10-03: 12.5 g via INTRAVENOUS
  Filled 2016-10-03: qty 250

## 2016-10-03 MED ORDER — ONDANSETRON HCL 4 MG/2ML IJ SOLN: 4.0000 mg | Freq: Once | INTRAMUSCULAR | Status: DC | PRN

## 2016-10-03 MED ORDER — HEMOSTATIC AGENTS (NO CHARGE) OPTIME
TOPICAL | Status: DC | PRN
  Administered 2016-10-03: 2 via TOPICAL

## 2016-10-03 MED ORDER — PROPOFOL 10 MG/ML IV BOLUS
INTRAVENOUS | Status: AC
  Filled 2016-10-03: qty 20

## 2016-10-03 MED ORDER — SODIUM CHLORIDE 0.9 % IV SOLN
Freq: Once | INTRAVENOUS | Status: DC
Start: 2016-10-03 — End: 2016-10-03

## 2016-10-03 MED ORDER — SODIUM CHLORIDE 0.9 % IV SOLN: 500.0000 mL | Freq: Once | INTRAVENOUS | Status: DC | PRN

## 2016-10-03 MED ORDER — PROPOFOL 10 MG/ML IV BOLUS
INTRAVENOUS | Status: DC | PRN
  Administered 2016-10-03: 110 mg via INTRAVENOUS

## 2016-10-03 MED ORDER — FENTANYL CITRATE (PF) 100 MCG/2ML IJ SOLN
25.0000 ug | INTRAMUSCULAR | Status: DC | PRN
  Administered 2016-10-03: 50 ug via INTRAVENOUS

## 2016-10-03 MED ORDER — LABETALOL HCL 5 MG/ML IV SOLN: 10.0000 mg | INTRAVENOUS | Status: DC | PRN

## 2016-10-03 MED ORDER — IOPAMIDOL (ISOVUE-300) INJECTION 61%
INTRAVENOUS | Status: AC
  Filled 2016-10-03: qty 50

## 2016-10-03 MED ORDER — DEXTROSE 5 % IV SOLN
1.5000 g | Freq: Two times a day (BID) | INTRAVENOUS | Status: AC
  Administered 2016-10-03 – 2016-10-04 (×2): 1.5 g via INTRAVENOUS
  Filled 2016-10-03 (×4): qty 1.5

## 2016-10-03 MED ORDER — ONDANSETRON HCL 4 MG/2ML IJ SOLN
INTRAMUSCULAR | Status: AC
  Filled 2016-10-03: qty 2

## 2016-10-03 MED ORDER — SODIUM CHLORIDE 0.9 % IV SOLN
INTRAVENOUS | Status: AC
  Administered 2016-10-03: 2.1 [IU]/h via INTRAVENOUS
  Filled 2016-10-03: qty 2.5

## 2016-10-03 MED ORDER — GUAIFENESIN-DM 100-10 MG/5ML PO SYRP: 15.0000 mL | ORAL_SOLUTION | ORAL | Status: DC | PRN

## 2016-10-03 MED ORDER — HYDRALAZINE HCL 20 MG/ML IJ SOLN: 5.0000 mg | INTRAMUSCULAR | Status: DC | PRN

## 2016-10-03 MED ORDER — HEPARIN SODIUM (PORCINE) 1000 UNIT/ML IJ SOLN
INTRAMUSCULAR | Status: AC
  Filled 2016-10-03: qty 1

## 2016-10-03 MED ORDER — FENTANYL CITRATE (PF) 100 MCG/2ML IJ SOLN
INTRAMUSCULAR | Status: AC
  Filled 2016-10-03: qty 4

## 2016-10-03 MED ORDER — PHENOL 1.4 % MT LIQD: 1.0000 | OROMUCOSAL | Status: DC | PRN

## 2016-10-03 MED ORDER — ONDANSETRON HCL 4 MG/2ML IJ SOLN: 4.0000 mg | Freq: Four times a day (QID) | INTRAMUSCULAR | Status: DC | PRN

## 2016-10-03 MED ORDER — OXYCODONE-ACETAMINOPHEN 5-325 MG PO TABS
1.0000 | ORAL_TABLET | ORAL | Status: DC | PRN
  Administered 2016-10-04 – 2016-10-06 (×3): 1 via ORAL
  Filled 2016-10-03 (×3): qty 1

## 2016-10-03 MED ORDER — FENTANYL CITRATE (PF) 100 MCG/2ML IJ SOLN
INTRAMUSCULAR | Status: DC | PRN
  Administered 2016-10-03: 50 ug via INTRAVENOUS
  Administered 2016-10-03: 100 ug via INTRAVENOUS
  Administered 2016-10-03 (×2): 50 ug via INTRAVENOUS

## 2016-10-03 MED ORDER — HEPARIN (PORCINE) IN NACL 100-0.45 UNIT/ML-% IJ SOLN
1450.0000 [IU]/h | INTRAMUSCULAR | Status: DC
  Administered 2016-10-03: 1450 [IU]/h via INTRAVENOUS
  Filled 2016-10-03 (×2): qty 250

## 2016-10-03 MED ORDER — FENTANYL CITRATE (PF) 100 MCG/2ML IJ SOLN
INTRAMUSCULAR | Status: AC
  Administered 2016-10-03: 50 ug via INTRAVENOUS
  Filled 2016-10-03: qty 2

## 2016-10-03 MED ORDER — LIDOCAINE HCL (CARDIAC) 20 MG/ML IV SOLN
INTRAVENOUS | Status: DC | PRN
  Administered 2016-10-03: 40 mg via INTRAVENOUS

## 2016-10-03 MED ORDER — ONDANSETRON HCL 4 MG/2ML IJ SOLN
INTRAMUSCULAR | Status: DC | PRN
  Administered 2016-10-03: 4 mg via INTRAVENOUS

## 2016-10-03 MED ORDER — HEPARIN SODIUM (PORCINE) 1000 UNIT/ML IJ SOLN
INTRAMUSCULAR | Status: AC
  Filled 2016-10-03: qty 2

## 2016-10-03 MED ORDER — THROMBIN 20000 UNITS EX SOLR
CUTANEOUS | Status: AC
  Filled 2016-10-03: qty 20000

## 2016-10-03 MED ORDER — MAGNESIUM SULFATE 2 GM/50ML IV SOLN
2.0000 g | Freq: Every day | INTRAVENOUS | Status: DC | PRN
  Filled 2016-10-03: qty 50

## 2016-10-03 MED ORDER — FENTANYL CITRATE (PF) 100 MCG/2ML IJ SOLN
INTRAMUSCULAR | Status: AC
  Filled 2016-10-03: qty 2

## 2016-10-03 MED ORDER — ALUM & MAG HYDROXIDE-SIMETH 200-200-20 MG/5ML PO SUSP
15.0000 mL | ORAL | Status: DC | PRN
Start: 2016-10-03 — End: 2016-10-07

## 2016-10-03 MED ORDER — ALBUMIN HUMAN 5 % IV SOLN
12.5000 g | Freq: Once | INTRAVENOUS | Status: AC
  Administered 2016-10-03: 12.5 g via INTRAVENOUS

## 2016-10-03 MED ORDER — MIDAZOLAM HCL 2 MG/2ML IJ SOLN
INTRAMUSCULAR | Status: AC
  Filled 2016-10-03: qty 2

## 2016-10-03 MED ORDER — DOCUSATE SODIUM 100 MG PO CAPS
100.0000 mg | ORAL_CAPSULE | Freq: Every day | ORAL | Status: DC
  Administered 2016-10-05 – 2016-10-07 (×3): 100 mg via ORAL
  Filled 2016-10-03 (×3): qty 1

## 2016-10-03 MED ORDER — POTASSIUM CHLORIDE CRYS ER 20 MEQ PO TBCR: 20.0000 meq | EXTENDED_RELEASE_TABLET | Freq: Every day | ORAL | Status: DC | PRN

## 2016-10-03 MED ORDER — ROCURONIUM BROMIDE 50 MG/5ML IV SOSY
PREFILLED_SYRINGE | INTRAVENOUS | Status: AC
  Filled 2016-10-03: qty 10

## 2016-10-03 MED ORDER — EPHEDRINE 5 MG/ML INJ
INTRAVENOUS | Status: AC
  Filled 2016-10-03: qty 10

## 2016-10-03 MED ORDER — SODIUM CHLORIDE 0.9 % IV SOLN
Freq: Once | INTRAVENOUS | Status: AC
  Administered 2016-10-03: 11:00:00 via INTRAVENOUS

## 2016-10-03 MED ORDER — ROCURONIUM BROMIDE 100 MG/10ML IV SOLN
INTRAVENOUS | Status: DC | PRN
  Administered 2016-10-03 (×4): 10 mg via INTRAVENOUS
  Administered 2016-10-03: 30 mg via INTRAVENOUS
  Administered 2016-10-03 (×2): 10 mg via INTRAVENOUS

## 2016-10-03 MED ORDER — PAPAVERINE HCL 30 MG/ML IJ SOLN
INTRAMUSCULAR | Status: AC
  Filled 2016-10-03: qty 2

## 2016-10-03 MED ORDER — 0.9 % SODIUM CHLORIDE (POUR BTL) OPTIME
TOPICAL | Status: DC | PRN
  Administered 2016-10-03: 2000 mL

## 2016-10-03 MED ORDER — LACTATED RINGERS IV SOLN
INTRAVENOUS | Status: DC | PRN
  Administered 2016-10-03 (×2): via INTRAVENOUS

## 2016-10-03 MED ORDER — FENTANYL CITRATE (PF) 100 MCG/2ML IJ SOLN
25.0000 ug | INTRAMUSCULAR | Status: DC | PRN
  Administered 2016-10-03 (×2): 25 ug via INTRAVENOUS

## 2016-10-03 MED ORDER — LACTATED RINGERS IV SOLN
INTRAVENOUS | Status: DC | PRN
  Administered 2016-10-03 (×2): via INTRAVENOUS

## 2016-10-03 MED ORDER — SUCCINYLCHOLINE CHLORIDE 200 MG/10ML IV SOSY
PREFILLED_SYRINGE | INTRAVENOUS | Status: AC
  Filled 2016-10-03: qty 10

## 2016-10-03 MED ORDER — HEPARIN SODIUM (PORCINE) 1000 UNIT/ML IJ SOLN
INTRAMUSCULAR | Status: DC | PRN
  Administered 2016-10-03 (×3): 8 mL via INTRAVENOUS

## 2016-10-03 MED ORDER — PHENYLEPHRINE HCL 10 MG/ML IJ SOLN
INTRAVENOUS | Status: DC | PRN
  Administered 2016-10-03: 20 ug/min via INTRAVENOUS

## 2016-10-03 SURGICAL SUPPLY — 60 items
BANDAGE ESMARK 6X9 LF (GAUZE/BANDAGES/DRESSINGS) IMPLANT
BNDG ESMARK 6X9 LF (GAUZE/BANDAGES/DRESSINGS)
CANISTER SUCTION 2500CC (MISCELLANEOUS) ×3 IMPLANT
CANNULA VESSEL 3MM 2 BLNT TIP (CANNULA) ×6 IMPLANT
CATH EMB 4FR 80CM (CATHETERS) ×6 IMPLANT
CATH EMB 5FR 80CM (CATHETERS) ×3 IMPLANT
CLIP TI MEDIUM 24 (CLIP) ×3 IMPLANT
CLIP TI WIDE RED SMALL 24 (CLIP) ×3 IMPLANT
CUFF TOURNIQUET SINGLE 24IN (TOURNIQUET CUFF) IMPLANT
CUFF TOURNIQUET SINGLE 34IN LL (TOURNIQUET CUFF) IMPLANT
CUFF TOURNIQUET SINGLE 44IN (TOURNIQUET CUFF) IMPLANT
DERMABOND ADVANCED (GAUZE/BANDAGES/DRESSINGS) ×3
DERMABOND ADVANCED .7 DNX12 (GAUZE/BANDAGES/DRESSINGS) ×6 IMPLANT
DRAIN SNY WOU (WOUND CARE) IMPLANT
DRAPE PROXIMA HALF (DRAPES) IMPLANT
DRAPE X-RAY CASS 24X20 (DRAPES) IMPLANT
ELECT REM PT RETURN 9FT ADLT (ELECTROSURGICAL) ×3
ELECTRODE REM PT RTRN 9FT ADLT (ELECTROSURGICAL) ×2 IMPLANT
EVACUATOR SILICONE 100CC (DRAIN) IMPLANT
GAUZE SPONGE 4X4 16PLY XRAY LF (GAUZE/BANDAGES/DRESSINGS) ×9 IMPLANT
GLOVE BIO SURGEON STRL SZ 6.5 (GLOVE) ×3 IMPLANT
GLOVE BIO SURGEON STRL SZ7.5 (GLOVE) ×3 IMPLANT
GLOVE BIOGEL PI IND STRL 6.5 (GLOVE) ×4 IMPLANT
GLOVE BIOGEL PI IND STRL 7.0 (GLOVE) ×8 IMPLANT
GLOVE BIOGEL PI INDICATOR 6.5 (GLOVE) ×2
GLOVE BIOGEL PI INDICATOR 7.0 (GLOVE) ×4
GLOVE SURG SS PI 6.5 STRL IVOR (GLOVE) ×6 IMPLANT
GOWN STRL REUS W/ TWL LRG LVL3 (GOWN DISPOSABLE) ×12 IMPLANT
GOWN STRL REUS W/TWL LRG LVL3 (GOWN DISPOSABLE) ×6
GRAFT PROPATEN THIN WALL 6X80 (Vascular Products) ×3 IMPLANT
HEMOSTAT SPONGE AVITENE ULTRA (HEMOSTASIS) ×6 IMPLANT
KIT BASIN OR (CUSTOM PROCEDURE TRAY) ×3 IMPLANT
KIT ROOM TURNOVER OR (KITS) ×3 IMPLANT
LOOP VESSEL MINI RED (MISCELLANEOUS) ×3 IMPLANT
NS IRRIG 1000ML POUR BTL (IV SOLUTION) ×6 IMPLANT
PACK PERIPHERAL VASCULAR (CUSTOM PROCEDURE TRAY) ×3 IMPLANT
PAD ARMBOARD 7.5X6 YLW CONV (MISCELLANEOUS) ×6 IMPLANT
PATCH HEMASHIELD 8X75 (Vascular Products) ×3 IMPLANT
SET COLLECT BLD 21X3/4 12 (NEEDLE) IMPLANT
STAPLER VISISTAT 35W (STAPLE) IMPLANT
STOPCOCK 4 WAY LG BORE MALE ST (IV SETS) ×3 IMPLANT
SUT ETHILON 3 0 PS 1 (SUTURE) IMPLANT
SUT PROLENE 5 0 C 1 24 (SUTURE) ×3 IMPLANT
SUT PROLENE 6 0 CC (SUTURE) ×24 IMPLANT
SUT PROLENE 7 0 BV 1 (SUTURE) ×3 IMPLANT
SUT PROLENE 7 0 BV1 MDA (SUTURE) IMPLANT
SUT SILK 2 0 SH (SUTURE) ×3 IMPLANT
SUT SILK 3 0 (SUTURE)
SUT SILK 3-0 18XBRD TIE 12 (SUTURE) IMPLANT
SUT VIC AB 2-0 SH 27 (SUTURE) ×2
SUT VIC AB 2-0 SH 27XBRD (SUTURE) ×4 IMPLANT
SUT VIC AB 3-0 SH 27 (SUTURE) ×2
SUT VIC AB 3-0 SH 27X BRD (SUTURE) ×4 IMPLANT
SUT VICRYL 4-0 PS2 18IN ABS (SUTURE) ×6 IMPLANT
SYR 3ML LL SCALE MARK (SYRINGE) ×6 IMPLANT
TAPE UMBILICAL COTTON 1/8X30 (MISCELLANEOUS) IMPLANT
TRAY FOLEY W/METER SILVER 16FR (SET/KITS/TRAYS/PACK) ×3 IMPLANT
TUBING EXTENTION W/L.L. (IV SETS) IMPLANT
UNDERPAD 30X30 (UNDERPADS AND DIAPERS) ×3 IMPLANT
WATER STERILE IRR 1000ML POUR (IV SOLUTION) ×3 IMPLANT

## 2016-10-03 NOTE — Progress Notes (Signed)
Patient transferred up from PACU.  Upon arrival, patient complaint of pain, moaning.  No pulses to RLE, below the knee.

## 2016-10-03 NOTE — Op Note (Signed)
Procedure: Right femoral endarterectomy with profundoplasty, right femoral to peroneal bypass with propaten PTFE  Preoperative diagnosis: Rest pain right foot  Postoperative diagnosis: Same  Anesthesia: Gen.  Assistant: Karsten RoKim Trinh PA-C  Operative findings: #1 severe calcification right common femoral and profunda #2 small peroneal outflow vessel barely accepting 1.5 mm dilator #3 6 mm propaten PTFE  Operative details: After obtaining informed consent, the patient states. The patient was placed in supine position operating table. After induction of general anesthesia and endotracheal intubation, a Foley catheter was placed. Next the patient's entire right lower extremity was prepped and draped in usual sterile fashion. A longitudinal incision was made in the right groin and carried onto the simultaneous tissue down to level of the pre-existing prosthetic bypass graft. This was a ring supported PTFE graft. It was very well incorporated. It was dissected free circumferentially and a vessel loop placed around this. Dissection was then carried down onto the level of the native common femoral system. There were some adhesions to this but overall the scar tissue was moderate in nature. The profunda femoris was dissected free circumferentially and was heavily calcified. There was no pulse within the common femoral artery. Superficial femoral artery was dissected free circumferentially and a vessel loop placed around this. Dissection was then carried up underneath the inguinal ligament for several centimeters. Neck branch was ligated and divided between silk ties. On palpation on the external iliac artery there was severe calcification as far as I could feel on the artery I did not feel that there would be a soft spot any higher. I felt I was above the level of stenosis that had been seen on the external iliac and common femoral artery on arteriogram. At this point attention was turned to the lower leg. A  longitudinal incision was made on the medial aspect of the leg through a pre-existing scar and carried onto subcutaneous tissues down level of the pre-existing vein bypass graft. This was occluded. Apparently it had been tunneled subcutaneously. He was dissected free circumferentially and vessel loop placed around this. I then carried the dissection down to the level of the takeoff of the posterior tibial artery the fibers of the soleus muscle were completely divided there was some venous bleeding this was controlled with cautery. The incision was deepened down to level the peroneal artery. There was some venous bleeding from tributaries of veins adjacent to this and this was controlled with several 6-0 Prolene sutures. The peroneal artery was dissected free circumferentially and a vessel loop placed around it. It was a very small vessel. It was soft on palpation. At this point a tunnel was created in a deep subsartorial plane through the heads of the gastrocnemius muscle up to the level of the groin. The patient was then given 7000 units of heparin. The patient was given an additional 2 doses of heparin during the course of the case. After 2 minutes of heparin circulation time, the pre-existing bypass graft based on the common femoral artery was divided and ligated distally with a silk tie. I then opened the artery longitudinally through the graft. The profunda and superficial femoral arteries were controlled with vessel loops. The common femoral artery initially was controlled with a #4 Fogarty catheter due to heavy calcification of the vessel. I then performed a right common femoral endarterectomy extending from the distal external iliac artery all the way down into the proximal 2 cm of the profunda. This required dissection free of the 3 main profunda branches distal  to the origin. I then obtain a pre-reasonable endpoint to the origin of all 3 of these vessels. All loose debris was removed from the femoral bed.  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 This was ligated and divided between silk ties. I then brought a Dacron patch on the operative field and sewed this on as a patch angioplasty extending from the distal external iliac artery all the way onto the proximal 2 cm of the profunda. This was done with a running 6-0 Prolene suture. Just prior completion anastomosis was forebled backbled and thoroughly flushed. Anastomosis was secured clamps released hemostasis was obtained with direct pressure and some Avitene. There was a good pulse within the common femoral artery at this point. There was biphasic flow in the profunda. The artery was then controlled proximally and distally with vessel loops on the common femoral artery. A longitudinal opening was made in the patch and a 6 mm propaten PTFE graft beveled and sewn end of graft to side of patch using a running 6-0 Prolene suture. The graft was clamped at its origin. Everything was forebled backbled and thoroughly flushed and flow restored to the profunda. The graft was then tunneled down to the level the peroneal artery. The peroneal artery was controlled proximally and distally with fine bulldog clamps. A longitudinal opening was made in the peroneal artery and the leg was straightened. The graft was then trimmed to length and beveled. There was some forward and backbleeding from the peroneal artery. It did accept a 1/2 mm dilator but would not accept anything larger than this. Several small side branches were controlled with clips. The graft was then sewn end of graft to side of artery using a running 6-0 Prolene suture. Just prior completion anastomosis was forebled backbled and thoroughly flushed. Anastomosis was secured clamps released there was good biphasic Doppler flow in the peroneal artery and good peroneal Doppler flow at the level of the ankle after opening the graft. Hemostasis was obtained with addition of 1 7-0 Prolene  repair stitches toe of the graft. I also applied Avitene to this. Hemostasis was obtained in both incisions using direct pressure for approximately 15-20 minutes due to needle hole bleeding. Next the below-knee incision was closed in multiple layers of 20 and 3-0 Vicryl suture with a 4-0 Vicryl subcutaneous stitch and the skin. The groin was also closed in similar fashions with running 200 by 30 followed by 4  Vicryl subcuticular stitch in the skin. Liquiband was then applied to both incisions. The patient tolerated the procedure well and there were no complications. The patient was noted to have a good peroneal Doppler signal which augmented essentially 100% with clamping and unclamping of the graft.  In the event that the patient has an acute graft occlusion I would not consider further revascularization attempts as the outflow vessel is quite poor.  Fabienne Brunsharles Fields, MD [facility] [place] Office: [phone] Pager: [phone]

## 2016-10-03 NOTE — Progress Notes (Signed)
Called to see pt for no doppler flow left foot.  Pt had PT DP peroneal doppler in PACU 2-3 hours ago.  She now has no doppler flow in right foot.  No hematoma.  Complains of some right foot pain.  Outflow vessel was tiny and mostly likely has caused graft thrombosis.  Will see how she does overnight if pain gets worse will proceed with right AKA tomorrow.    Pt daughter updated Will make NPO p midnight  Fabienne Brunsharles Fields, MD [facility] [place] Office: [phone] Pager: [phone]

## 2016-10-03 NOTE — Anesthesia Preprocedure Evaluation (Addendum)
Anesthesia Evaluation  Patient identified by MRN, date of birth, ID band Patient awake    Reviewed: Allergy & Precautions, NPO status , Patient's Chart, lab work & pertinent test results, reviewed documented beta blocker date and time   Airway Mallampati: II  TM Distance: >3 FB Neck ROM: Full    Dental  (+) Edentulous Upper, Poor Dentition, Dental Advisory Given   Pulmonary COPD,  oxygen dependent, former smoker,    breath sounds clear to auscultation       Cardiovascular hypertension, + CAD, + Past MI, + Peripheral Vascular Disease and +CHF   Rhythm:Regular Rate:Normal     Neuro/Psych Depression CVA, No Residual Symptoms    GI/Hepatic GERD  Medicated and Controlled,  Endo/Other  diabetes, Type 2, Insulin DependentHypothyroidism   Renal/GU Renal InsufficiencyRenal disease     Musculoskeletal  (+) Arthritis , Osteoarthritis,    Abdominal   Peds  Hematology  (+) anemia ,   Anesthesia Other Findings   Reproductive/Obstetrics                           Anesthesia Physical Anesthesia Plan  ASA: III  Anesthesia Plan: General   Post-op Pain Management:    Induction: Intravenous  Airway Management Planned: Oral ETT  Additional Equipment: Arterial line  Intra-op Plan:   Post-operative Plan: Extubation in OR  Informed Consent: I have reviewed the patients History and Physical, chart, labs and discussed the procedure including the risks, benefits and alternatives for the proposed anesthesia with the patient or authorized representative who has indicated his/her understanding and acceptance.   Dental advisory given  Plan Discussed with: CRNA, Anesthesiologist and Surgeon  Anesthesia Plan Comments:        Anesthesia Quick Evaluation

## 2016-10-03 NOTE — Anesthesia Procedure Notes (Signed)
Procedure Name: Intubation Date/Time: 10/03/2016 8:26 AM Performed by: Rogelia BogaMUELLER, Elianna Windom P Pre-anesthesia Checklist: Patient identified, Emergency Drugs available, Suction available, Patient being monitored and Timeout performed Patient Re-evaluated:Patient Re-evaluated prior to inductionOxygen Delivery Method: Circle system utilized Preoxygenation: Pre-oxygenation with 100% oxygen Ventilation: Mask ventilation without difficulty Tube type: Reinforced Tube size: 6.0 mm Number of attempts: 1 Airway Equipment and Method: Tracheostomy Placement Confirmation: positive ETCO2 and breath sounds checked- equal and bilateral Tube secured with: Tape Dental Injury: Teeth and Oropharynx as per pre-operative assessment  Comments: 6.0  Reinforced ETT easily placed through pt existing tracheostomy as above

## 2016-10-03 NOTE — Progress Notes (Signed)
Patient transported via bed to room 3s05. Patient presented with doppler + pulses to right leg, peroneal, DP and post tibial, prior to transport. During transport and arrival to 3s05 patient C/O of increasing pain to the the right leg. Attempted to doppler pulses but unsuccessful. Dr Darrick PennaFields notified of status. No further orders at this time. Dr Darrick PennaFields Stated he will be up to see patient in a little bit.

## 2016-10-03 NOTE — H&P (View-Only) (Signed)
    Subjective  -   No complaints   Physical Exam:  Right foot stable Breathing OK abd soft CV:  RRR      Assessment/Plan:    Con't heparin gtt Plan for bypass tomorrow with Dr. Darrick PennaFields NPO after midnight  [name], Becky 10/02/2016 12:58 PM --  Vitals:   10/02/16 0440 10/02/16 [name]  BP:  (!) 130/57  Pulse: 84 89  Resp: 18 18  Temp:  98.8 F (37.1 C)    Intake/Output Summary (Last 24 hours) at 10/02/16 1258 Last data filed at 10/01/16 1700  Gross per 24 hour  Intake              480 ml  Output                0 ml  Net              480 ml     Laboratory CBC    Component Value Date/Time   WBC 13.0 (H) 10/02/2016 0239   HGB 8.1 (L) 10/02/2016 0239   HCT 26.3 (L) 10/02/2016 0239   PLT 316 10/02/2016 0239    BMET    Component Value Date/Time   NA 136 10/02/2016 0239   K 4.8 10/02/2016 0239   CL 95 (L) 10/02/2016 0239   CO2 32 10/02/2016 0239   GLUCOSE 203 (H) 10/02/2016 0239   BUN 31 (H) 10/02/2016 0239   CREATININE 1.52 (H) 10/02/2016 0239   CREATININE 1.59 (H) 05/27/2016 1020   CALCIUM 8.9 10/02/2016 0239   GFRNONAA 34 (L) 10/02/2016 0239   GFRAA 39 (L) 10/02/2016 0239    COAG Lab Results  Component Value Date   INR 1.22 09/30/2016   INR 1.25 09/29/2016   INR 1.28 09/28/2016   No results found for: PTT  Antibiotics Anti-infectives    Start     Dose/Rate Route Frequency Ordered Stop   10/03/16 0800  cefUROXime (ZINACEF) 1.5 g in dextrose 5 % 50 mL IVPB     1.5 g 100 mL/hr over 30 Minutes Intravenous To ShortStay Procedural 09/30/16 1040 10/04/16 0800   09/22/16 1200  cefpodoxime (VANTIN) tablet 200 mg     200 mg Oral Every 12 hours 09/22/16 1050 09/28/16 0828   09/22/16 0000  cefpodoxime (VANTIN) 200 MG tablet     200 mg Oral Every 12 hours 09/22/16 1053     09/21/16 0600  cefTRIAXone (ROCEPHIN) 2 g in dextrose 5 % 50 mL IVPB  Status:  Discontinued     2 g 100 mL/hr over 30 Minutes Intravenous Every 24 hours 09/21/16 0511  09/22/16 1050       V. Charlena CrossWells [name] IV, M.D. [facility] [place] Office: [phone] Pager:  [phone]

## 2016-10-03 NOTE — Interval H&P Note (Signed)
History and Physical Interval Note:  10/03/2016 7:24 AM  Scot Junorothy I Keplinger  has presented today for surgery, with the diagnosis of Peripheral vascular disease with right lower extremity rest pain I70.221  The various methods of treatment have been discussed with the patient and family. After consideration of risks, benefits and other options for treatment, the patient has consented to  Procedure(s): REDO BYPASS GRAFT FEMORAL-PERONEAL (Right) as a surgical intervention .  The patient's history has been reviewed, patient examined, no change in status, stable for surgery.  I have reviewed the patient's chart and labs.  Questions were answered to the patient's satisfaction.     Fabienne BrunsFields, Miho Monda

## 2016-10-03 NOTE — Progress Notes (Signed)
[facility]  Subjective  - awake but groggy in PACU   Objective (!) 133/55 84 100.3 F (37.9 C) 19 99%  Intake/Output Summary (Last 24 hours) at 10/03/16 1658 Last data filed at 10/03/16 1445  Gross per 24 hour  Intake          4834.53 ml  Output             1750 ml  Net          3084.53 ml   Biphasic peroneal doppler Brisk PT DP doppler No hematoma  Assessment/Planning: Stable in PACU Mental status should improve as more awake later today No plans to return to OR if loses doppler signals.  Distal target was 1.5 mm peroneal using prosthetic.  Durability issues discussed with pt daughter.  Will plan on long term anticoagulation indefinitely on d/c due to disadvantaged bypass graft in addition to treatment of her DVT.  Ok to start heparin 8 pm tonight  Fabienne BrunsFields, Adja Ruff 10/03/2016 4:58 PM --  Laboratory Lab Results:  Recent Labs  10/02/16 0239 10/03/16 0348  WBC 13.0* 12.0*  HGB 8.1* 7.9*  HCT 26.3* 25.4*  PLT 316 316   BMET  Recent Labs  10/02/16 0239 10/03/16 0348  NA 136 138  K 4.8 4.5  CL 95* 96*  CO2 32 32  GLUCOSE 203* 96  BUN 31* 29*  CREATININE 1.52* 1.27*  CALCIUM 8.9 9.1    COAG Lab Results  Component Value Date   INR 1.22 09/30/2016   INR 1.25 09/29/2016   INR 1.28 09/28/2016   No results found for: PTT

## 2016-10-03 NOTE — Anesthesia Postprocedure Evaluation (Signed)
Anesthesia Post Note  Patient: [name]  Procedure(s) Performed: Procedure(s) (LRB): REDO BYPASS GRAFT FEMORAL-PERONEAL (Right) ENDARTERECTOMY FEMORAL WITH PROFUNDOPLASTY (Right)  Patient location during evaluation: PACU Anesthesia Type: General Level of consciousness: awake, awake and alert and oriented Pain management: pain level controlled Vital Signs Assessment: post-procedure vital signs reviewed and stable Respiratory status: spontaneous breathing, nonlabored ventilation and respiratory function stable Cardiovascular status: blood pressure returned to baseline Anesthetic complications: no    Last Vitals:  Vitals:   10/03/16 1720 10/03/16 1730  BP: (!) 113/48   Pulse: 81 82  Resp: 17 18  Temp:      Last Pain:  Vitals:   10/03/16 1730  TempSrc:   PainSc: 10-Worst pain ever                 Rodriques Badie COKER

## 2016-10-03 NOTE — Progress Notes (Signed)
In the OR for whole day, unable to see her  Clint LippsMutaz A Edan Juday Pager: [phone] 10/03/2016, 5:35 PM

## 2016-10-03 NOTE — Transfer of Care (Signed)
Immediate Anesthesia Transfer of Care Note  Patient: [name]  Procedure(s) Performed: Procedure(s): REDO BYPASS GRAFT FEMORAL-PERONEAL (Right) ENDARTERECTOMY FEMORAL WITH PROFUNDOPLASTY (Right)  Patient Location: PACU  Anesthesia Type:General  Level of Consciousness: awake  Airway & Oxygen Therapy: Patient Spontanous Breathing and Patient connected to tracheostomy mask oxygen  Post-op Assessment: Report given to RN  Post vital signs: Reviewed and stable  Last Vitals:  Vitals:   10/03/16 0539 10/03/16 0540  BP: (!) 134/51   Pulse: 95 95  Resp: 19 16  Temp: 37.5 C     Last Pain:  Vitals:   10/03/16 0539  TempSrc: Oral  PainSc:       Patients Stated Pain Goal: 2 (10/02/16 1952)  Complications: No apparent anesthesia complications

## 2016-10-03 NOTE — Progress Notes (Signed)
ANTICOAGULATION CONSULT NOTE - Follow Up Consult  Pharmacy Consult for Heparin Indication: hx DVT now w/ RLE ischemia  Allergies  Allergen Reactions  . Aldactone [Spironolactone] Other (See Comments)    Severe hyperkalemia   . Lisinopril Other (See Comments) and Cough    Hypotension also  . Crestor [Rosuvastatin Calcium] Other (See Comments)    Muscle Pain  . Vicodin [Hydrocodone-Acetaminophen] Nausea And Vomiting    Patient Measurements: Height: 5\' 2"  (157.5 cm) Weight: 163 lb 14.4 oz (74.3 kg) IBW/kg (Calculated) : 50.1 Heparin Dosing Weight: 64kg  Vital Signs: Temp: 100.3 F (37.9 C) (12/11 1501) Temp Source: Oral (12/11 0539) BP: 133/55 (12/11 1517) Pulse Rate: 84 (12/11 1530)  Labs:  Recent Labs  10/01/16 0316 10/02/16 0239 10/03/16 0348  HGB 8.4* 8.1* 7.9*  HCT 26.4* 26.3* 25.4*  PLT 337 316 316  HEPARINUNFRC 0.41 0.42  --   CREATININE  --  1.52* 1.27*    Estimated Creatinine Clearance: 38.9 mL/min (by C-G formula based on SCr of 1.27 mg/dL (H)).  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 Coumadin held, INR reversed, and she was started on heparin. S/P RLE aortogram and stent of left external iliac artery 12/6. Heparin continued pending right fem peroneal bypass today. She is now s/p procedure and heparin to resumed tonight at 2000. Previously therapeutic on 1450 units/hr.  Goal of Therapy:  Heparin level 0.3-0.7 units/ml Monitor platelets by anticoagulation protocol: Yes   Plan:  1) At 2000, begin heparin at 1450 units/hr with no bolus 2) Daily heparin level and CBC 3) Follow up transition to oral AC  Becky RiggerMarkle, [name] 10/03/2016,3:37 PM

## 2016-10-04 ENCOUNTER — Inpatient Hospital Stay (HOSPITAL_COMMUNITY): Payer: Medicare Other | Admitting: Anesthesiology

## 2016-10-04 ENCOUNTER — Ambulatory Visit: Payer: Medicare Other | Admitting: Podiatry

## 2016-10-04 ENCOUNTER — Encounter (HOSPITAL_COMMUNITY): Payer: Self-pay | Admitting: Certified Registered"

## 2016-10-04 ENCOUNTER — Encounter (HOSPITAL_COMMUNITY)
Admission: EM | Disposition: A | Discharge status: Skilled Nursing Facility | Payer: Self-pay | Source: Home / Self Care | Attending: Internal Medicine

## 2016-10-04 ENCOUNTER — Encounter (HOSPITAL_COMMUNITY): Payer: Medicare Other

## 2016-10-04 HISTORY — PX: AMPUTATION: SHX166

## 2016-10-04 LAB — POCT I-STAT 4, (NA,K, GLUC, HGB,HCT)
GLUCOSE: 167 mg/dL — AB (ref 65–99)
GLUCOSE: 152 mg/dL — AB (ref 65–99)
GLUCOSE: 118 mg/dL — AB (ref 65–99)
GLUCOSE: 156 mg/dL — AB (ref 65–99)
Glucose, Bld: 174 mg/dL — ABNORMAL HIGH (ref 65–99)
Glucose, Bld: 99 mg/dL (ref 65–99)
HCT: 20 % — ABNORMAL LOW (ref 36.0–46.0)
HCT: 25 % — ABNORMAL LOW (ref 36.0–46.0)
HCT: 25 % — ABNORMAL LOW (ref 36.0–46.0)
HEMATOCRIT: 26 % — AB (ref 36.0–46.0)
HEMATOCRIT: 23 % — AB (ref 36.0–46.0)
HEMATOCRIT: 21 % — AB (ref 36.0–46.0)
HEMOGLOBIN: 6.8 g/dL — AB (ref 12.0–15.0)
HEMOGLOBIN: 8.8 g/dL — AB (ref 12.0–15.0)
HEMOGLOBIN: 7.8 g/dL — AB (ref 12.0–15.0)
Hemoglobin: 8.5 g/dL — ABNORMAL LOW (ref 12.0–15.0)
Hemoglobin: 8.5 g/dL — ABNORMAL LOW (ref 12.0–15.0)
Hemoglobin: 7.1 g/dL — ABNORMAL LOW (ref 12.0–15.0)
POTASSIUM: 4.6 mmol/L (ref 3.5–5.1)
POTASSIUM: 4.8 mmol/L (ref 3.5–5.1)
POTASSIUM: 4.8 mmol/L (ref 3.5–5.1)
Potassium: 5 mmol/L (ref 3.5–5.1)
Potassium: 4.8 mmol/L (ref 3.5–5.1)
Potassium: 4.8 mmol/L (ref 3.5–5.1)
SODIUM: 136 mmol/L (ref 135–145)
SODIUM: 137 mmol/L (ref 135–145)
Sodium: 136 mmol/L (ref 135–145)
Sodium: 136 mmol/L (ref 135–145)
Sodium: 137 mmol/L (ref 135–145)
Sodium: 135 mmol/L (ref 135–145)

## 2016-10-04 LAB — CBC
HCT: 26.2 % — ABNORMAL LOW (ref 36.0–46.0)
HEMATOCRIT: 19.9 % — AB (ref 36.0–46.0)
HEMATOCRIT: 26 % — AB (ref 36.0–46.0)
HEMOGLOBIN: 8.8 g/dL — AB (ref 12.0–15.0)
HEMOGLOBIN: 8.8 g/dL — AB (ref 12.0–15.0)
Hemoglobin: 6.6 g/dL — CL (ref 12.0–15.0)
MCH: 28.8 pg (ref 26.0–34.0)
MCH: 29 pg (ref 26.0–34.0)
MCH: 29.3 pg (ref 26.0–34.0)
MCHC: 33.2 g/dL (ref 30.0–36.0)
MCHC: 33.6 g/dL (ref 30.0–36.0)
MCHC: 33.8 g/dL (ref 30.0–36.0)
MCV: 86.9 fL (ref 78.0–100.0)
MCV: 86.5 fL (ref 78.0–100.0)
MCV: 86.7 fL (ref 78.0–100.0)
Platelets: 193 10*3/uL (ref 150–400)
Platelets: 193 10*3/uL (ref 150–400)
Platelets: 199 10*3/uL (ref 150–400)
RBC: 2.29 MIL/uL — ABNORMAL LOW (ref 3.87–5.11)
RBC: 3 MIL/uL — ABNORMAL LOW (ref 3.87–5.11)
RBC: 3.03 MIL/uL — AB (ref 3.87–5.11)
RDW: 18.2 % — AB (ref 11.5–15.5)
RDW: 17.3 % — ABNORMAL HIGH (ref 11.5–15.5)
RDW: 17.4 % — ABNORMAL HIGH (ref 11.5–15.5)
WBC: 16.2 10*3/uL — AB (ref 4.0–10.5)
WBC: 14.4 10*3/uL — ABNORMAL HIGH (ref 4.0–10.5)
WBC: 14.7 10*3/uL — ABNORMAL HIGH (ref 4.0–10.5)

## 2016-10-04 LAB — POCT I-STAT 7, (LYTES, BLD GAS, ICA,H+H)
ACID-BASE EXCESS: 8 mmol/L — AB (ref 0.0–2.0)
Bicarbonate: 31.9 mmol/L — ABNORMAL HIGH (ref 20.0–28.0)
CALCIUM ION: 1.15 mmol/L (ref 1.15–1.40)
HCT: 21 % — ABNORMAL LOW (ref 36.0–46.0)
HEMOGLOBIN: 7.1 g/dL — AB (ref 12.0–15.0)
O2 SAT: 96 %
PCO2 ART: 41.1 mmHg (ref 32.0–48.0)
PH ART: 7.497 — AB (ref 7.350–7.450)
POTASSIUM: 4.5 mmol/L (ref 3.5–5.1)
Patient temperature: 36.8
SODIUM: 137 mmol/L (ref 135–145)
TCO2: 33 mmol/L (ref 0–100)
pO2, Arterial: 78 mmHg — ABNORMAL LOW (ref 83.0–108.0)

## 2016-10-04 LAB — GLUCOSE, CAPILLARY
GLUCOSE-CAPILLARY: 119 mg/dL — AB (ref 65–99)
GLUCOSE-CAPILLARY: 146 mg/dL — AB (ref 65–99)
Glucose-Capillary: 149 mg/dL — ABNORMAL HIGH (ref 65–99)
Glucose-Capillary: 122 mg/dL — ABNORMAL HIGH (ref 65–99)
Glucose-Capillary: 120 mg/dL — ABNORMAL HIGH (ref 65–99)
Glucose-Capillary: 125 mg/dL — ABNORMAL HIGH (ref 65–99)
Glucose-Capillary: 108 mg/dL — ABNORMAL HIGH (ref 65–99)

## 2016-10-04 LAB — HEPARIN LEVEL (UNFRACTIONATED): Heparin Unfractionated: <0.1 IU/mL — ABNORMAL LOW (ref 0.30–0.70)

## 2016-10-04 LAB — BASIC METABOLIC PANEL
ANION GAP: 10 (ref 5–15)
BUN: 27 mg/dL — AB (ref 6–20)
CALCIUM: 7.8 mg/dL — AB (ref 8.9–10.3)
CO2: 26 mmol/L (ref 22–32)
Chloride: 101 mmol/L (ref 101–111)
Creatinine, Ser: 1.64 mg/dL — ABNORMAL HIGH (ref 0.44–1.00)
GFR calc Af Amer: 36 mL/min — ABNORMAL LOW (ref >60)
GFR calc non Af Amer: 31 mL/min — ABNORMAL LOW (ref >60)
GLUCOSE: 215 mg/dL — AB (ref 65–99)
POTASSIUM: 5.2 mmol/L — AB (ref 3.5–5.1)
Sodium: 137 mmol/L (ref 135–145)

## 2016-10-04 LAB — PREPARE RBC (CROSSMATCH)

## 2016-10-04 SURGERY — AMPUTATION, ABOVE KNEE
Anesthesia: General | Site: Leg Lower | Laterality: Right

## 2016-10-04 MED ORDER — PHENYLEPHRINE 40 MCG/ML (10ML) SYRINGE FOR IV PUSH (FOR BLOOD PRESSURE SUPPORT)
PREFILLED_SYRINGE | INTRAVENOUS | Status: AC
  Filled 2016-10-04: qty 10

## 2016-10-04 MED ORDER — HYDROMORPHONE HCL 1 MG/ML IJ SOLN
0.2500 mg | INTRAMUSCULAR | Status: DC | PRN
  Administered 2016-10-04: 0.25 mg via INTRAVENOUS

## 2016-10-04 MED ORDER — HYDROMORPHONE HCL 1 MG/ML IJ SOLN
INTRAMUSCULAR | Status: AC
  Filled 2016-10-04: qty 0.5

## 2016-10-04 MED ORDER — PHENYLEPHRINE HCL 10 MG/ML IJ SOLN
INTRAMUSCULAR | Status: DC | PRN
  Administered 2016-10-04: 120 ug via INTRAVENOUS
  Administered 2016-10-04 (×2): 80 ug via INTRAVENOUS
  Administered 2016-10-04 (×2): 120 ug via INTRAVENOUS

## 2016-10-04 MED ORDER — LIDOCAINE HCL (CARDIAC) 20 MG/ML IV SOLN
INTRAVENOUS | Status: DC | PRN
  Administered 2016-10-04: 50 mg via INTRAVENOUS

## 2016-10-04 MED ORDER — HEPARIN SODIUM (PORCINE) 5000 UNIT/ML IJ SOLN
5000.0000 [IU] | Freq: Three times a day (TID) | INTRAMUSCULAR | Status: DC
  Administered 2016-10-05 – 2016-10-06 (×6): 5000 [IU] via SUBCUTANEOUS
  Filled 2016-10-04 (×6): qty 1

## 2016-10-04 MED ORDER — CHLORHEXIDINE GLUCONATE 0.12 % MT SOLN
15.0000 mL | Freq: Two times a day (BID) | OROMUCOSAL | Status: DC
  Administered 2016-10-04 – 2016-10-07 (×6): 15 mL via OROMUCOSAL
  Filled 2016-10-04 (×6): qty 15

## 2016-10-04 MED ORDER — SODIUM CHLORIDE 0.9 % IV SOLN: Freq: Once | INTRAVENOUS | Status: DC

## 2016-10-04 MED ORDER — FENTANYL CITRATE (PF) 100 MCG/2ML IJ SOLN
INTRAMUSCULAR | Status: DC | PRN
  Administered 2016-10-04 (×2): 50 ug via INTRAVENOUS

## 2016-10-04 MED ORDER — DEXTROSE 5 % IV SOLN
1.5000 g | Freq: Two times a day (BID) | INTRAVENOUS | Status: AC
  Administered 2016-10-05 (×2): 1.5 g via INTRAVENOUS
  Filled 2016-10-04 (×2): qty 1.5

## 2016-10-04 MED ORDER — SODIUM CHLORIDE 0.9 % IR SOLN
Status: DC | PRN
Start: 2016-10-04 — End: 2016-10-04
  Administered 2016-10-04: 1000 mL

## 2016-10-04 MED ORDER — PROPOFOL 10 MG/ML IV BOLUS
INTRAVENOUS | Status: DC | PRN
  Administered 2016-10-04: 120 mg via INTRAVENOUS
  Administered 2016-10-04: 40 mg via INTRAVENOUS

## 2016-10-04 MED ORDER — LIDOCAINE 2% (20 MG/ML) 5 ML SYRINGE
INTRAMUSCULAR | Status: AC
Start: 2016-10-04 — End: 2016-10-04
  Filled 2016-10-04: qty 5

## 2016-10-04 MED ORDER — ORAL CARE MOUTH RINSE
15.0000 mL | Freq: Two times a day (BID) | OROMUCOSAL | Status: DC
  Administered 2016-10-04: 15 mL via OROMUCOSAL

## 2016-10-04 MED ORDER — ONDANSETRON HCL 4 MG/2ML IJ SOLN
INTRAMUSCULAR | Status: DC | PRN
  Administered 2016-10-04: 4 mg via INTRAVENOUS

## 2016-10-04 MED ORDER — ONDANSETRON HCL 4 MG/2ML IJ SOLN
INTRAMUSCULAR | Status: AC
  Filled 2016-10-04: qty 2

## 2016-10-04 MED ORDER — PROPOFOL 10 MG/ML IV BOLUS
INTRAVENOUS | Status: AC
  Filled 2016-10-04: qty 20

## 2016-10-04 MED ORDER — FENTANYL CITRATE (PF) 100 MCG/2ML IJ SOLN
INTRAMUSCULAR | Status: AC
  Filled 2016-10-04: qty 2

## 2016-10-04 SURGICAL SUPPLY — 40 items
BANDAGE ACE 6X5 VEL STRL LF (GAUZE/BANDAGES/DRESSINGS) ×2 IMPLANT
BLADE SAW RECIP 87.9 MT (BLADE) ×2 IMPLANT
BNDG COHESIVE 4X5 TAN STRL (GAUZE/BANDAGES/DRESSINGS) ×2 IMPLANT
BNDG COHESIVE 6X5 TAN STRL LF (GAUZE/BANDAGES/DRESSINGS) ×2 IMPLANT
BNDG GAUZE ELAST 4 BULKY (GAUZE/BANDAGES/DRESSINGS) ×2 IMPLANT
CANISTER SUCTION 2500CC (MISCELLANEOUS) ×2 IMPLANT
CLIP TI MEDIUM 6 (CLIP) IMPLANT
COVER SURGICAL LIGHT HANDLE (MISCELLANEOUS) ×2 IMPLANT
COVER TABLE BACK 60X90 (DRAPES) ×2 IMPLANT
DRAIN CHANNEL 19F RND (DRAIN) IMPLANT
DRAPE ORTHO SPLIT 77X108 STRL (DRAPES) ×2
DRAPE PROXIMA HALF (DRAPES) ×2 IMPLANT
DRAPE SURG ORHT 6 SPLT 77X108 (DRAPES) ×2 IMPLANT
DRSG ADAPTIC 3X8 NADH LF (GAUZE/BANDAGES/DRESSINGS) ×2 IMPLANT
ELECT REM PT RETURN 9FT ADLT (ELECTROSURGICAL) ×2
ELECTRODE REM PT RTRN 9FT ADLT (ELECTROSURGICAL) ×1 IMPLANT
EVACUATOR SILICONE 100CC (DRAIN) IMPLANT
GAUZE SPONGE 4X4 12PLY STRL (GAUZE/BANDAGES/DRESSINGS) ×2 IMPLANT
GLOVE BIO SURGEON STRL SZ7.5 (GLOVE) ×2 IMPLANT
GOWN STRL REUS W/ TWL LRG LVL3 (GOWN DISPOSABLE) ×3 IMPLANT
GOWN STRL REUS W/TWL LRG LVL3 (GOWN DISPOSABLE) ×3
KIT BASIN OR (CUSTOM PROCEDURE TRAY) ×2 IMPLANT
KIT ROOM TURNOVER OR (KITS) ×2 IMPLANT
NS IRRIG 1000ML POUR BTL (IV SOLUTION) ×2 IMPLANT
PACK GENERAL/GYN (CUSTOM PROCEDURE TRAY) ×2 IMPLANT
PAD ARMBOARD 7.5X6 YLW CONV (MISCELLANEOUS) ×4 IMPLANT
STAPLER VISISTAT 35W (STAPLE) ×2 IMPLANT
STOCKINETTE IMPERVIOUS LG (DRAPES) ×2 IMPLANT
SUT ETHILON 3 0 PS 1 (SUTURE) IMPLANT
SUT SILK 2 0 SH (SUTURE) IMPLANT
SUT SILK 2 0 SH CR/8 (SUTURE) ×2 IMPLANT
SUT SILK 2 0 TIES 10X30 (SUTURE) ×2 IMPLANT
SUT VIC AB 2-0 CT1 18 (SUTURE) IMPLANT
SUT VIC AB 2-0 SH 18 (SUTURE) ×4 IMPLANT
SUT VIC AB 3-0 SH 27 (SUTURE) ×2
SUT VIC AB 3-0 SH 27X BRD (SUTURE) ×2 IMPLANT
TOWEL OR 17X24 6PK STRL BLUE (TOWEL DISPOSABLE) ×2 IMPLANT
TOWEL OR 17X26 10 PK STRL BLUE (TOWEL DISPOSABLE) ×2 IMPLANT
UNDERPAD 30X30 (UNDERPADS AND DIAPERS) ×2 IMPLANT
WATER STERILE IRR 1000ML POUR (IV SOLUTION) IMPLANT

## 2016-10-04 NOTE — Op Note (Signed)
[facility] OPERATIVE NOTE  Procedure: Right above knee amputation  Surgeon(s): Zain Bingman E Heinrich Fertig, MD  ASSISTANT: Doreatha MassedSamantha RhynSherren Kernse, PA-C  Anesthesia: General  Specimens: Right leg  PROCEDURE DETAIL: After obtaining informed consent, the patient was taken to the operating room. The patient was placed in supine position the operating room table. After induction of general anesthesia and endotracheal intubation the patient's Foley catheter was placed. Next patient's entire right lower extremity was prepped and draped in usual sterile fashion. A circumferential incision was made on the right leg just above the knee. The incision was carried down into the sucutaneous tissues down to level the 2 pre-existing PTFE grafts. Each was ligated and divided between silk ties. Soft tissues were taken down as well as the muscle and fascia with cautery. The superficial femoral artery and vein were dissected free circumferentially clamped and divided. These were suture ligated proximally. Remainder of the soft tissues were taken down with cautery. The periosteum was raised on the femur approximately 5 cm above the skin edge. The femur was divided at this level. The leg was passed off the table as a specimen. Hemostasis was obtained. The wound was thoroughly irrigated with normal saline solution. The fascial edges were reapproximated using interrupted 2 0 Vicryl sutures. The subcutaneous tissues reapproximated using a running 3-0 Vicryl suture. The skin was closed staples. Patient tolerated procedure well and there were no complications. Instrument sponge and needle counts correct in the case. Patient was taken to recovery in stable condition.  Fabienne Brunsharles Circe Chilton, MD [facility] [place] Office: [phone] Pager: [phone]

## 2016-10-04 NOTE — Progress Notes (Signed)
[facility] PROGRESS NOTE  Becky JunDorothy I [name] ZOX:[id number] DOB: 02/09/1946 DOA: 09/19/2016  PCP: [name],CYNTHIA S, MD   Subjective/Interval History: Yesterday's events noted, patient has right lower extremity pain after surgery, pulse was undetectable. Patient is going to the OR today again for right AKA.  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 CBG was noted to be 28. There was also some concern for narcotic overdose. She had good response to Narcan. Patient was hospitalized for further management. Patient's mental status improved with improvement in glucose levels. Then patient started complaining of right foot pain. She has a history of peripheral vascular disease, status post bypass. Concern was about ischemia to the right lower extremity. Doppler study showed limited flow. Femoral-popliteal bypass surgery was done on 12/11, later in the day patient has more pain and pulses was undetectable even with Doppler, going back to or today for AKA.  Reason for Visit: Altered mental status  Consultants: Vascular surgery  Procedures:  Right lower extremity arterial Dopplers Right ABIs indicate a severe reduction in arterial flow at rest. Unable to obtain all pressures due to venous signals overriding the arterial signals. Left ABI indicates a moderate reduction in arterial flow at rest.  Antibiotics: Vantin  ROS: Denies chest pain or shortness of breath. No nausea or vomiting.  Objective:  Vital Signs  Vitals:   10/04/16 0545 10/04/16 0630 10/04/16 0753 10/04/16 0755  BP: (!) 106/40   (!) 132/50  Pulse: 88 87  88  Resp: (!) 37 (!) 36  20  Temp: 98.2 F (36.8  C)  98.2 F (36.8 C)   TempSrc: Oral  Axillary   SpO2: 99% 100%  100%  Weight:      Height:        Intake/Output Summary (Last 24 hours) at 10/04/16 1148 Last data filed at 10/04/16 1143  Gross per 24 hour  Intake          5131.41 ml  Output              900 ml  Net          4231.41 ml   Filed Weights   10/01/16 0438 10/03/16 0539 10/04/16 0500  Weight: 74.8 kg (165 lb) 74.3 kg (163 lb 14.4 oz) 75 kg (165 lb 5.5 oz)    General appearance: alert, cooperative, appears stated age and no distress Tracheostomy Resp: clear to auscultation bilaterally Cardio: regular rate and rhythm, S1, S2 normal, no murmur, click, rub or gallop GI: soft, non-tender; bowel sounds normal; no masses,  no organomegaly Extremities: Right foot is cold to touch Neurologic: Awake and alert. Oriented 3.  Lab Results:  Data Reviewed: I have personally reviewed following labs and imaging studies  CBC:  Recent Labs Lab 10/01/16 0316 10/02/16 0239 10/03/16 0348  10/03/16 1230 10/03/16 1340 10/03/16 1440 10/03/16 1705 10/04/16 0100  WBC 12.5* 13.0* 12.0*  --   --   --   --  17.9* 16.2*  HGB 8.4* 8.1* 7.9*  < > 8.8* 8.5* 7.8* 8.5* 6.6*  HCT 26.4* 26.3* 25.4*  < > 26.0* 25.0* 23.0* 26.2* 19.9*  MCV 91.3 92.3 92.0  --   --   --   --  87.0 86.9  PLT  337 316 316  --   --   --   --  247 193  < > = values in this interval not displayed.  Basic Metabolic Panel:  Recent Labs Lab 09/30/16 0719 10/02/16 0239 10/03/16 0348  10/03/16 1230 10/03/16 1340 10/03/16 1440 10/03/16 1705 10/04/16 0100  NA 137 136 138  < > 136 137 137 137 137  K 4.0 4.8 4.5  < > 5.0 4.8 4.8 5.2* 5.2*  CL 97* 95* 96*  --   --   --   --  101 101  CO2 33* 32 32  --   --   --   --  28 26  GLUCOSE 142* 203* 96  < > 152* 118* 99 157* 215*  BUN 35* 31* 29*  --   --   --   --  25* 27*  CREATININE 1.40* 1.52* 1.27*  --   --   --   --  1.27* 1.64*  CALCIUM 8.8* 8.9 9.1  --   --   --   --  8.0* 7.8*  < > = values in this  interval not displayed.  GFR: Estimated Creatinine Clearance: 30.3 mL/min (by C-G formula based on SCr of 1.64 mg/dL (H)).  Liver Function Tests: No results for input(s): AST, ALT, ALKPHOS, BILITOT, PROT, ALBUMIN in the last 168 hours.  Coagulation Profile:  Recent Labs Lab 09/28/16 0541 09/29/16 0359 09/30/16 0300  INR 1.28 1.25 1.22    CBG:  Recent Labs Lab 10/03/16 2010 10/03/16 2318 10/04/16 0255 10/04/16 0754 10/04/16 1029  GLUCAP 222* 270* 149* 122* 120*     No results found for this or any previous visit (from the past 240 hour(s)).    Radiology Studies: No results found.   Medications:  Scheduled: . [MAR Hold] sodium chloride   Intravenous Once  . [MAR Hold] sodium chloride   Intravenous Once  . [MAR Hold] aspirin EC  81 mg Oral Daily  . [MAR Hold] budesonide  0.25 mg Nebulization BID  . [MAR Hold] carvedilol  12.5 mg Oral BID WC  . [MAR Hold] chlorhexidine  15 mL Mouth Rinse BID  . [MAR Hold] dicyclomine  20 mg Oral TID AC  . [MAR Hold] docusate sodium  100 mg Oral Daily  . [MAR Hold] DULoxetine  60 mg Oral QHS  . [MAR Hold] ferrous sulfate  325 mg Oral Q breakfast  . [MAR Hold] gabapentin  300 mg Oral QHS  . [MAR Hold] hydrALAZINE  25 mg Oral Q8H  . [MAR Hold] insulin aspart  0-15 Units Subcutaneous Q4H  . [MAR Hold] insulin glargine  5 Units Subcutaneous Daily  . [MAR Hold] isosorbide mononitrate  15 mg Oral Daily  . [MAR Hold] levothyroxine  75 mcg Oral QAC breakfast  . [MAR Hold] mouth rinse  15 mL Mouth Rinse q12n4p  . [MAR Hold]  morphine injection  1 mg Intravenous Once  . [MAR Hold] multivitamin with minerals  1 tablet Oral Daily  . [MAR Hold] pantoprazole  40 mg Oral Daily  . [MAR Hold] simvastatin  40 mg Oral QPM  . [MAR Hold] tiZANidine  2 mg Oral Daily  . [MAR Hold] torsemide  40 mg Oral Daily   Continuous: . sodium chloride 50 mL/hr at 10/03/16 2306  . heparin Stopped (10/04/16 0753)   PRN:[MAR Hold] sodium chloride, [MAR Hold]  acetaminophen, [MAR Hold] albuterol, [MAR Hold] alum & mag hydroxide-simeth, [MAR Hold] guaiFENesin-dextromethorphan, [MAR Hold] hydrALAZINE, [MAR Hold] ipratropium-albuterol, [MAR Hold]  labetalol, [MAR Hold] magnesium sulfate 1 - 4 g bolus IVPB, [MAR Hold] metoprolol, [MAR Hold]  morphine injection, [MAR Hold] ondansetron, [MAR Hold] oxyCODONE-acetaminophen, [MAR Hold] phenol, [MAR Hold] polyethylene glycol, [MAR Hold] potassium chloride, [MAR Hold] senna-docusate, sodium chloride irrigation, [MAR Hold] traMADol  Assessment/Plan:  Principal Problem:   Toxic metabolic encephalopathy Active Problems:   Chronic combined systolic and diastolic CHF, NYHA class 2 (EF 30-35%) per ECHO 2017   Hypertension   Chronic respiratory failure with hypoxia (HCC)   Hypoglycemia   Altered mental status   Uncontrolled type 2 diabetes mellitus with complication (HCC)   Essential hypertension   Acute deep vein thrombosis (DVT) of distal end of right lower extremity (HCC)    Right foot pain/Ischemic pain -Arterial Dopplers show severely limited circulation in the right.  -S/p vascular surgery to that lower extremity in the past and concern is for occlusion of her old grafts.  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 -Right femoroperoneal bypass done on Monday 12/11, going back to the OR for right AKA. -Monitor stump closely for bleeding, patient is on anticoagulation.  RLE DVT -Warfarin discontinued due to need for angiogram of the lower extremity. Currently on IV heparin. INR is less than 2.  -She had to be given 2.5 mg of vitamin K on 12/2.  -Currently on heparin drip -Likely can start Eliquis on discharge.  Acute Metabolic Encephalopathy -Etiology is thought to be multifactorial including hypoglycemia and UTI. -Opioid overdose is unlikely as patient is an skilled nursing facility. -Somnolence observed on 12/7 AM  resolved.  Diabetes type 2 uncontrolled with complications/Hypoglycemia Patient is on long acting insulin at her skilled nursing facility. She did have evidence for hypoglycemia at presentation and also in the hospital. Patient denies any recent changes to her medication dosage. HbA1c is 8.7. CBGs stabilized. She was started on Lantus at lower dose. CBGs are well controlled at this time. Will need to lower her dose of Tresiba at the time of discharge.   Sepsis secondary to UTI Now resolved. Patient was noted to have leukocytosis, however without fever. Urine culture is again growing Klebsiella. She grew the same bacteria on November 15. Based on discharge summary it appears that she was treated with Keflex. Sensitivities are available. On Vantin for now. Also, one out of 2 blood cultures is growing strep viridans. This is most likely a contaminant.  Bacteremia with strep viridans 1/2 blood cultures positive for. This is most likely a contaminant. Patient has not had any fevers.   Urinary tract infection with Klebsiella Continue Vantin. Will need at least 7 days of treatment. End date will be 09/27/16.  Essential hypertension  Stable. Continue home medications.  Chronic Systolic and Diastolic CHF Compensated. Continue home medications.  Pulmonary hypertension Stable  Chronic respiratory failure (Chronic Tracheostomy) Stable, trach care, mucous plug removed on 12/7 AM by nursing staff.  One Episode of missed beat overnight Patient was asymptomatic. Electrolytes are within normal range. Continue to monitor.   DVT Prophylaxis: On heparin drip, switch to Eliquis on discharge if renal function allows Code Status: Full code  Family Communication: Discussed with the patient  Disposition Plan: Will await the outcome of her femoroperoneal bypass surgery    LOS: 14 days   [facility]  [facility] Pager [phone] 10/04/2016, 11:48 AM  If 7PM-7AM, please contact  night-coverage at [email or web address], password [facility]

## 2016-10-04 NOTE — Progress Notes (Signed)
CRITICAL VALUE ALERT  Critical value received:  HMG 6.6  Date of notification:  10/04/2016  Time of notification:  1:38 AM   Critical value read back:Yes.    Nurse who received alert:  Cresenciano LickMikaela Majestic Brister   MD notified (1st page):  Craige CottaKirby, NP  Time of first page:  1:38 AM   MD notified (2nd page):  Time of second page:  Responding MD:  Craige CottaKirby, NP  Time MD responded:  1:39 AM

## 2016-10-04 NOTE — Progress Notes (Signed)
Notified by central tele of pt having a 1.38 second sinus pause. I verified on the monitors, checked pt. Pt asymptomatic BP 101/79. HR 77. A&Ox4. MD notified. Will continue to monitor.

## 2016-10-04 NOTE — Transfer of Care (Signed)
Immediate Anesthesia Transfer of Care Note  Patient: [name]  Procedure(s) Performed: Procedure(s): AMPUTATION ABOVE KNEE (Right)  Patient Location: PACU  Anesthesia Type:General  Level of Consciousness: awake  Airway & Oxygen Therapy: Patient Spontanous Breathing and Patient connected to tracheostomy mask oxygen  Post-op Assessment: Report given to RN  Post vital signs: Reviewed and stable  Last Vitals:  Vitals:   10/04/16 0753 10/04/16 0755  BP:  (!) 132/50  Pulse:  88  Resp:  20  Temp: 36.8 C     Last Pain:  Vitals:   10/04/16 0753  TempSrc: Axillary  PainSc:       Patients Stated Pain Goal: 2 (10/03/16 1805)  Complications: No apparent anesthesia complications

## 2016-10-04 NOTE — H&P (View-Only) (Signed)
Called to see pt for no doppler flow left foot.  Pt had PT DP peroneal doppler in PACU 2-3 hours ago.  She now has no doppler flow in right foot.  No hematoma.  Complains of some right foot pain.  Outflow vessel was tiny and mostly likely has caused graft thrombosis.  Will see how she does overnight if pain gets worse will proceed with right AKA tomorrow.    Pt daughter updated Will make NPO p midnight  Becky Hascall, MD [facility] [place] Office: [phone] Pager: [phone]  

## 2016-10-04 NOTE — Progress Notes (Addendum)
OT Cancellation Note  Patient Details Name: [name] MRN: [id number] DOB: 09/28/1946   Cancelled Treatment:    Reason Eval/Treat Not Completed: Patient not medically ready.  Pt currently in OR for Rt ADK.  Will see when medically appropriate.   Gunnar Hereford Flandreauonarpe, OTR/L [phone]   Jeani HawkingConarpe, Lorana Maffeo M 10/04/2016, 10:54 AM

## 2016-10-04 NOTE — Interval H&P Note (Signed)
History and Physical Interval Note:  10/04/2016 10:28 AM  [name]  has presented today for surgery, with the diagnosis of Peripheral Vascular Disease Right Leg  The various methods of treatment have been discussed with the patient and family. After consideration of risks, benefits and other options for treatment, the patient has consented to  Procedure(s): AMPUTATION ABOVE KNEE (Right) as a surgical intervention .  The patient's history has been reviewed, patient examined, no change in status, stable for surgery.  I have reviewed the patient's chart and labs.  Questions were answered to the patient's satisfaction.     Fabienne BrunsFields, Jonh Mcqueary

## 2016-10-04 NOTE — Progress Notes (Signed)
Physical medicine rehabilitation consult requested chart reviewed. Patient status post right AKA today 10/04/2016. Plan for physical and occupational therapy evaluations will follow up with appropriate recommendations

## 2016-10-04 NOTE — Anesthesia Procedure Notes (Addendum)
Procedure Name: Intubation Date/Time: 10/04/2016 10:56 AM Performed by: De NurseENNIE, Nakai Pollio E Pre-anesthesia Checklist: Patient identified, Emergency Drugs available, Suction available, Patient being monitored and Timeout performed Patient Re-evaluated:Patient Re-evaluated prior to inductionOxygen Delivery Method: Circle system utilized Preoxygenation: Pre-oxygenation with 100% oxygen Intubation Type: IV induction and Tracheostomy Tube type: Reinforced Tube size: 6.0 mm Number of attempts: 1 Airway Equipment and Method: Tracheostomy Placement Confirmation: positive ETCO2 and breath sounds checked- equal and bilateral Secured at: 12 cm Tube secured with: Tape Dental Injury: Teeth and Oropharynx as per pre-operative assessment

## 2016-10-04 NOTE — Anesthesia Postprocedure Evaluation (Signed)
Anesthesia Post Note  Patient: [name]  Procedure(s) Performed: Procedure(s) (LRB): AMPUTATION ABOVE KNEE (Right)  Patient location during evaluation: PACU Anesthesia Type: General Level of consciousness: awake and alert and patient cooperative Pain management: pain level controlled Vital Signs Assessment: post-procedure vital signs reviewed and stable Respiratory status: spontaneous breathing and respiratory function stable Cardiovascular status: stable Anesthetic complications: no    Last Vitals:  Vitals:   10/04/16 0755 10/04/16 1216  BP: (!) 132/50 (!) 132/49  Pulse: 88 83  Resp: 20 19  Temp:  36.5 C    Last Pain:  Vitals:   10/04/16 1216  TempSrc:   PainSc: Asleep                 Crystall Donaldson S

## 2016-10-04 NOTE — Progress Notes (Signed)
Spoke with Craige CottaKirby reguarding pt. Pt does not have pulses in R foot, vascular surg aware of that.  Hmg drop of 2 points since last draw and on heparin gtt. No bleeding noted after full assessment of pt. Pt also having decreased urine output but foly cath is leaking so output is innacurate. BP is stable, pt is very drowsy and A/O x2 at times, that has been her baseline  since arrival to unit on 1900. Craige CottaKirby stated to cont heparin gtt for now and give one unit PRBC over three hours.

## 2016-10-04 NOTE — Progress Notes (Signed)
PT Cancellation Note  Patient Details Name: [name] MRN: [id number] DOB: 12/10/1945   Cancelled Treatment:    Reason Eval/Treat Not Completed: Patient at procedure or test/unavailable; noted patient now for R AKA.  Will await new orders following surgery.   Elray McgregorCynthia Austin Pongratz 10/04/2016, 8:42 AM  Sheran Lawlessyndi Shalimar Mcclain, PT [phone] 10/04/2016

## 2016-10-04 NOTE — Clinical Social Work Note (Signed)
CSW continues to follow for discharge needs.  Breiona Couvillon, CSW [phone]  

## 2016-10-04 NOTE — Anesthesia Preprocedure Evaluation (Signed)
Anesthesia Evaluation  Patient identified by MRN, date of birth, ID band Patient awake    Reviewed: Allergy & Precautions, H&P , NPO status , Patient's Chart, lab work & pertinent test results  Airway Mallampati: II   Neck ROM: full    Dental   Pulmonary COPD, former smoker,    breath sounds clear to auscultation       Cardiovascular hypertension, + CAD, + Past MI, + Cardiac Stents, + CABG, + Peripheral Vascular Disease and +CHF   Rhythm:regular Rate:Normal     Neuro/Psych Depression  Neuromuscular disease CVA    GI/Hepatic GERD  ,  Endo/Other  diabetes, Type 2Hypothyroidism   Renal/GU Renal InsufficiencyRenal disease     Musculoskeletal  (+) Arthritis ,   Abdominal   Peds  Hematology  (+) anemia ,   Anesthesia Other Findings   Reproductive/Obstetrics                             Anesthesia Physical Anesthesia Plan  ASA: IV  Anesthesia Plan: General   Post-op Pain Management:    Induction: Intravenous  Airway Management Planned: Tracheostomy  Additional Equipment:   Intra-op Plan:   Post-operative Plan:   Informed Consent: I have reviewed the patients History and Physical, chart, labs and discussed the procedure including the risks, benefits and alternatives for the proposed anesthesia with the patient or authorized representative who has indicated his/her understanding and acceptance.     Plan Discussed with: CRNA, Anesthesiologist and Surgeon  Anesthesia Plan Comments:         Anesthesia Quick Evaluation

## 2016-10-05 ENCOUNTER — Encounter (HOSPITAL_COMMUNITY): Payer: Self-pay | Admitting: Vascular Surgery

## 2016-10-05 LAB — GLUCOSE, CAPILLARY
GLUCOSE-CAPILLARY: 195 mg/dL — AB (ref 65–99)
Glucose-Capillary: 148 mg/dL — ABNORMAL HIGH (ref 65–99)
Glucose-Capillary: 146 mg/dL — ABNORMAL HIGH (ref 65–99)
Glucose-Capillary: 208 mg/dL — ABNORMAL HIGH (ref 65–99)
Glucose-Capillary: 186 mg/dL — ABNORMAL HIGH (ref 65–99)

## 2016-10-05 LAB — BASIC METABOLIC PANEL
ANION GAP: 8 (ref 5–15)
BUN: 25 mg/dL — ABNORMAL HIGH (ref 6–20)
CALCIUM: 7.8 mg/dL — AB (ref 8.9–10.3)
CHLORIDE: 107 mmol/L (ref 101–111)
CO2: 25 mmol/L (ref 22–32)
Creatinine, Ser: 1.31 mg/dL — ABNORMAL HIGH (ref 0.44–1.00)
GFR calc Af Amer: 47 mL/min — ABNORMAL LOW (ref >60)
GFR calc non Af Amer: 40 mL/min — ABNORMAL LOW (ref >60)
Glucose, Bld: 152 mg/dL — ABNORMAL HIGH (ref 65–99)
POTASSIUM: 4.6 mmol/L (ref 3.5–5.1)
Sodium: 140 mmol/L (ref 135–145)

## 2016-10-05 LAB — CBC
HEMATOCRIT: 24.4 % — AB (ref 36.0–46.0)
HEMATOCRIT: 23.5 % — AB (ref 36.0–46.0)
HEMOGLOBIN: 8 g/dL — AB (ref 12.0–15.0)
HEMOGLOBIN: 7.5 g/dL — AB (ref 12.0–15.0)
MCH: 29.1 pg (ref 26.0–34.0)
MCH: 28.5 pg (ref 26.0–34.0)
MCHC: 32.8 g/dL (ref 30.0–36.0)
MCHC: 31.9 g/dL (ref 30.0–36.0)
MCV: 88.7 fL (ref 78.0–100.0)
MCV: 89.4 fL (ref 78.0–100.0)
Platelets: 188 10*3/uL (ref 150–400)
Platelets: 178 10*3/uL (ref 150–400)
RBC: 2.75 MIL/uL — ABNORMAL LOW (ref 3.87–5.11)
RBC: 2.63 MIL/uL — ABNORMAL LOW (ref 3.87–5.11)
RDW: 18.1 % — ABNORMAL HIGH (ref 11.5–15.5)
RDW: 17.9 % — AB (ref 11.5–15.5)
WBC: 14.2 10*3/uL — AB (ref 4.0–10.5)
WBC: 11.9 10*3/uL — AB (ref 4.0–10.5)

## 2016-10-05 NOTE — Care Management Note (Signed)
Case Management Note  Patient Details  Name: Scot JunDorothy I Embree MRN: [id number] Date of Birth: 02/16/1946  Subjective/Objective:  S/p R AKA on 12/12, she has chronic Trach on tr[facility] collar [facility] but [facility].  CSW following for placement.                  Action/Plan:   Expected Discharge Date:                  Expected Discharge Plan:  Skilled Nursing Facility  In-House Referral:  Clinical Social Work  Discharge planning Services  CM Consult  Post Acute Care Choice:    Choice offered to:     DME Arranged:    DME Agency:     HH Arranged:    HH Agency:     Status of Service:  Completed, signed off  If discussed at [employer] Meetings, dates discussed:    Additional Comments:  Leone Havenaylor, Jabria Loos [place], RN 10/05/2016, 2:02 PM

## 2016-10-05 NOTE — Progress Notes (Signed)
Physical medicine rehabilitation consult requested chart reviewed. Patient is a resident of [facility] skilled nursing facility. Plan remains to return to skilled nursing facility. Patient will not need inpatient rehabilitation services with plan to return to skilled nursing facility.

## 2016-10-05 NOTE — Care Management Important Message (Signed)
Important Message  Patient Details  Name: Scot JunDorothy I Wortley MRN: [id number] Date of Birth: 04/05/1946   Medicare Important Message Given:  Yes    Kyla BalzarineShealy, Chaun Uemura Abena 10/05/2016, 10:21 AM

## 2016-10-05 NOTE — Progress Notes (Addendum)
  Progress Note    10/05/2016 7:19 AM 1 Day Post-Op  Subjective:  Pt and daughter are very thankful for Dr. Darrick PennaFields.  Daughter states that she realizes the leg is gone, but so thankful that her mom's pain is gone too.  She has had one pain pill since surgery.  Tm 99 now afebrile HR  70's-80's NSR 120's-150's systolic 99% TC  Vitals:   10/05/16 0331 10/05/16 0600  BP: (!) 145/56 (!) 152/67  Pulse: 84   Resp: (!) 32   Temp: 98.4 F (36.9 C)     Physical Exam: Incisions:  Right stump is bandaged, which is clean and dry   CBC    Component Value Date/Time   WBC 14.2 (H) 10/05/2016 0505   RBC 2.75 (L) 10/05/2016 0505   HGB 8.0 (L) 10/05/2016 0505   HCT 24.4 (L) 10/05/2016 0505   PLT 188 10/05/2016 0505   MCV 88.7 10/05/2016 0505   MCH 29.1 10/05/2016 0505   MCHC 32.8 10/05/2016 0505   RDW 18.1 (H) 10/05/2016 0505   LYMPHSABS 0.5 (L) 09/19/2016 2320   MONOABS 1.4 (H) 09/19/2016 2320   EOSABS 0.1 09/19/2016 2320   BASOSABS 0.0 09/19/2016 2320    BMET    Component Value Date/Time   NA 140 10/05/2016 0505   K 4.6 10/05/2016 0505   CL 107 10/05/2016 0505   CO2 25 10/05/2016 0505   GLUCOSE 152 (H) 10/05/2016 0505   BUN 25 (H) 10/05/2016 0505   CREATININE 1.31 (H) 10/05/2016 0505   CREATININE 1.59 (H) 05/27/2016 1020   CALCIUM 7.8 (L) 10/05/2016 0505   GFRNONAA 40 (L) 10/05/2016 0505   GFRAA 47 (L) 10/05/2016 0505    INR    Component Value Date/Time   INR 1.22 09/30/2016 0300     Intake/Output Summary (Last 24 hours) at 10/05/16 0719 Last data filed at 10/05/16 0400  Gross per 24 hour  Intake          2454.21 ml  Output              720 ml  Net          1734.21 ml     Assessment/Plan:  70 y.o. female is s/p right above knee amputation  1 Day Post-Op  -pt doing very well this morning.  Pre-operative pain has improved. -will take down bandage tomorrow -[facility].  Daughter states that she is not going back there  and it was determined that [facility].  CIR states pt will not need inpatient rehab and will return to SNF.   Doreatha MassedSamantha Rhyne, PA-C [facility] [phone] 10/05/2016 7:19 AM  Agree with above Change AKA dressing tomorrow D/[facility] when pain controlled Does not need anticoagulation as DVT removed with amputation  Fabienne Brunsharles Ascencion Coye, MD [facility] [place] Office: [phone] Pager: [phone]

## 2016-10-05 NOTE — Progress Notes (Signed)
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 CBG was noted to be 28. There was also some concern for narcotic overdose. She had good response to Narcan. Patient was hospitalized for further management. Patient's mental status improved with improvement in glucose levels. Then patient started complaining of right foot pain. She has a history of peripheral vascular disease, status post bypass. Concern was about ischemia to the right lower extremity. Doppler study showed limited flow. Femoral-popliteal bypass surgery was done on 12/11, later in the day patient has more pain and pulses was undetectable even with Doppler. 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  Reason for Visit: Peripheral vascular disease  Consultants: Vascular surgery  Procedures:  Right lower extremity arterial Dopplers Right ABIs indicate a severe reduction in arterial flow at rest. Unable to obtain all pressures due to venous signals overriding the arterial signals. Left ABI indicates a moderate reduction in arterial flow at rest.  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 She states that her pain has resolved. Denies any shortness of breath, chest pain.   ROS: Denies any nausea, vomiting.  Objective:  Vital Signs  Vitals:   10/05/16 0600  10/05/16 0730 10/05/16 0825 10/05/16 0900  BP: (!) 152/67 (!) 149/62  (!) 149/62  Pulse:  87 84 85  Resp:  (!) 33 16 (!) 22  Temp:    98.8 F (37.1 C)  TempSrc:    Axillary  SpO2:  98% 94% 93%  Weight:      Height:        Intake/Output Summary (Last 24 hours) at 10/05/16 1128 Last data filed at 10/05/16 1021  Gross per 24 hour  Intake           1627.5 ml  Output             1120 ml  Net            507.5 ml   Filed Weights   10/03/16 0539 10/04/16 0500 10/05/16 0421  Weight: 74.3 kg (163 lb 14.4 oz) 75 kg (165 lb 5.5 oz) 74.3 kg (163 lb 11.2 oz)    General appearance: alert, cooperative, appears stated age and no distress Resp: clear to auscultation bilaterally Cardio: regular rate and rhythm, S1, S2 normal, no murmur, click, rub or gallop GI: soft, non-tender; bowel sounds normal; no masses,  no organomegaly Extremities: extremities normal, atraumatic, no cyanosis or edema Neurologic: Awake and alert. Oriented 3. No focal neurological deficits  Lab Results:  Data Reviewed: I have personally reviewed following labs and imaging studies  CBC:  Recent Labs Lab 10/03/16 0348  10/03/16 1705 10/04/16 0100 10/04/16 1122 10/04/16 1241 10/05/16 0505  WBC 12.0*  --  17.9* 16.2*  --  14.7*  14.4* 14.2*  HGB 7.9*  < > 8.5* 6.6* 7.1* 8.8*  8.8* 8.0*  HCT 25.4*  < > 26.2* 19.9* 21.0* 26.0*  26.2* 24.4*  MCV 92.0  --  87.0 86.9  --  86.7  86.5 88.7  PLT 316  --  247 193  --  199  193 188  < > = values in this interval not displayed.  Basic Metabolic Panel:  Recent Labs Lab 10/02/16 0239 10/03/16 0348  10/03/16 1440 10/03/16 1705 10/04/16 0100 10/04/16 1122 10/05/16 0505  NA 136 138  < > 137 137 137 135 140  K 4.8 4.5  < > 4.8 5.2* 5.2* 4.8 4.6  CL 95* 96*  --   --  101 101  --  107  CO2 32 32  --   --  28 26  --  25  GLUCOSE 203* 96  < > 99 157* 215* 156* 152*  BUN 31* 29*  --   --  25* 27*  --  25*  CREATININE 1.52* 1.27*  --   --  1.27* 1.64*  --  1.31*    CALCIUM 8.9 9.1  --   --  8.0* 7.8*  --  7.8*  < > = values in this interval not displayed.  GFR: Estimated Creatinine Clearance: 37.7 mL/min (by C-G formula based on SCr of 1.31 mg/dL (H)).  Coagulation Profile:  Recent Labs Lab 09/29/16 0359 09/30/16 0300  INR 1.25 1.22    CBG:  Recent Labs Lab 10/04/16 1556 10/04/16 1935 10/04/16 2322 10/05/16 0335 10/05/16 0859  GLUCAP 146* 125* 108* 148* 146*     Medications:  Scheduled: . sodium chloride   Intravenous Once  . sodium chloride   Intravenous Once  . aspirin EC  81 mg Oral Daily  . budesonide  0.25 mg Nebulization BID  . carvedilol  12.5 mg Oral BID WC  . chlorhexidine  15 mL Mouth Rinse BID  . dicyclomine  20 mg Oral TID AC  . docusate sodium  100 mg Oral Daily  . DULoxetine  60 mg Oral QHS  . ferrous sulfate  325 mg Oral Q breakfast  . gabapentin  300 mg Oral QHS  . heparin  5,000 Units Subcutaneous Q8H  . hydrALAZINE  25 mg Oral Q8H  . insulin aspart  0-15 Units Subcutaneous Q4H  . insulin glargine  5 Units Subcutaneous Daily  . isosorbide mononitrate  15 mg Oral Daily  . levothyroxine  75 mcg Oral QAC breakfast  . mouth rinse  15 mL Mouth Rinse q12n4p  . multivitamin with minerals  1 tablet Oral Daily  . pantoprazole  40 mg Oral Daily  . simvastatin  40 mg Oral QPM  . tiZANidine  2 mg Oral Daily  . torsemide  40 mg Oral Daily   Continuous: . sodium chloride 50 mL/hr at 10/05/16 1021   AVW:UJWJXBJYNWGNF, albuterol, alum & mag hydroxide-simeth, guaiFENesin-dextromethorphan, hydrALAZINE, ipratropium-albuterol, labetalol, magnesium sulfate 1 - 4 g bolus IVPB, metoprolol, morphine injection, ondansetron, oxyCODONE-acetaminophen, phenol, polyethylene glycol, potassium chloride, senna-docusate, traMADol  Assessment/Plan:  Principal Problem:   Toxic metabolic encephalopathy Active Problems:   Chronic combined systolic and diastolic CHF, NYHA class 2 (EF 30-35%) per ECHO 2017   Hypertension   Chronic  respiratory failure with hypoxia (HCC)   Hypoglycemia   Altered mental status   Uncontrolled type 2 diabetes mellitus with complication (HCC)   Essential hypertension   Acute deep vein thrombosis (DVT) of distal end of right lower extremity (HCC)     Right foot pain/Ischemic pain -Arterial Dopplers showed severely limited circulation in the right.  -S/p vascular surgery to that lower extremity in the past and concern was for occlusion of her old grafts.  -  Arteriogram done On 09/28/2016 showed occlusion of the right external iliac artery, a stent placed on the left. -Right femoroperoneal bypass done on Monday 12/11. 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 Patient is stable currently. -Monitor stump closely for bleeding, patient is on anticoagulation.  RLE DVT -Warfarin discontinued due to need for angiogram of the lower extremity.   -She had to be given 2.5 mg of vitamin K on 12/2.  -Currently on heparin drip -Likely can start Eliquis on discharge.  Normocytic anemia with a component of acute blood loss. Patient had to be transfused blood. Drop in hemoglobin, likely due to surgery. Continue to monitor hemoglobin closely. No obvious overt bleeding has been noted.  Acute Metabolic Encephalopathy -Etiology is thought to be multifactorial including hypoglycemia and UTI. -Opioid overdose is unlikely as patient is an skilled nursing facility. -Somnolence observed on 12/7 AM resolved.  Diabetes type 2 uncontrolled with complications/Hypoglycemia Patient is on long acting insulin at her skilled nursing facility. She did have evidence for hypoglycemia at presentation and also in the hospital. Patient denies any recent changes to her medication dosage. HbA1c is 8.7. CBGs stabilized. She was started on Lantus at lower dose. CBGs are well controlled at this time. Will need to lower her dose of Tresiba at the time of discharge.   Sepsis secondary  to UTI Now resolved. Patient was noted to have leukocytosis, however without fever. Urine culture again grew Klebsiella. She grew the same bacteria on November 15. Based on discharge summary it appears that she was treated with Keflex. Sensitivities are available. Patient was placed on Vantin and has completed the course. Also, one out of 2 blood cultures is growing strep viridans. This is most likely a contaminant.  Bacteremia with strep viridans 1/2 blood cultures positive for. This is most likely a contaminant. Patient has not had any fevers.   Urinary tract infection with Klebsiella Patient has completed a course of antibiotics with Vantin.  Essential hypertension  Stable. Continue home medications.  Chronic Systolic and Diastolic CHF Compensated. Continue home medications.  Pulmonary hypertension Stable  Chronic respiratory failure (Chronic Tracheostomy) Stable, trach care, mucous plug removed on 12/7 AM by nursing staff.  One Episode of missed beat overnight Patient was asymptomatic. Electrolytes are within normal range. Continue to monitor.   DVT Prophylaxis: Currently on IV heparin    Code Status: Full code  Family Communication: Discussed with the patient and her daughter who was at the bedside  Disposition Plan: Continue to monitor closely. Plan is eventually for her to go to skilled nursing facility when ready for discharge.    LOS: 15 days   [facility] Endoscopy CenterKRISHNAN,Esli Jernigan  [facility] Pager [phone] 10/05/2016, 11:28 AM  If 7PM-7AM, please contact night-coverage at [email or web address], password [email or web address]

## 2016-10-05 NOTE — Evaluation (Signed)
Physical Therapy Evaluation Patient Details Name: [name] MRN: [id number] DOB: 06/17/1946 Today's Date: 10/05/2016   History of Present Illness  Pt is a 70 y/o female admitted secondary to AMS, toxic metabolic encephalopathy. PMH including but not limited to chronic trach dependent, CAD, CKD, COPD, PAD, CVA (2013), DM and hx of CABG. 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  Clinical Impression  Pt motivated to get up OOB. Pt now a right AKA and requires maxAx2 for OOB mobility. Pt to benefit from ST-SNF prior to d/c home to achieve safe mod I w/c level of function as daughter works during the day.    Follow Up Recommendations SNF    Equipment Recommendations  None recommended by PT    Recommendations for Other Services       Precautions / Restrictions Precautions Precautions: Fall Precaution Comments: Chronic Trach Restrictions Weight Bearing Restrictions: Yes RLE Weight Bearing: Non weight bearing Other Position/Activity Restrictions: pt s/p R AKA      Mobility  Bed Mobility Overal bed mobility: Needs Assistance Bed Mobility: Supine to Sit     Supine to sit: Mod assist;HOB elevated     General bed mobility comments: pt initiated bringing LEs off EOB, modA for trunk elevation  Transfers Overall transfer level: Needs assistance Equipment used: 2 person hand held assist Transfers: Sit to/from [employer]GI CorporationStand;Stand Pivot Transfers Sit to Stand: Max assist;+2 physical assistance Stand pivot transfers: Max assist;+2 physical assistance       General transfer comment: Max +2 with use of bed pad for sit to stand from EOB. Max +2 for stand pivot to chair with verbal cues for sequencing.  Ambulation/Gait                Stairs            Wheelchair Mobility    Modified Rankin (Stroke Patients Only)       Balance Overall balance assessment: Needs assistance Sitting-balance support: Feet supported;Bilateral upper extremity supported Sitting balance-Leahy Scale: Fair      Standing balance support: Bilateral upper extremity supported Standing balance-Leahy Scale: Poor Standing balance comment: UE support needed for balance                             Pertinent Vitals/Pain Pain Assessment: Faces Faces Pain Scale: Hurts little more Pain Location: R residual limb Pain Descriptors / Indicators: Grimacing;Guarding Pain Intervention(s): Monitored during session    Home Living Family/patient expects to be discharged to:: Skilled nursing facility Living Arrangements: Children Available Help at Discharge: Family;Available PRN/intermittently Type of Home: House Home Access: Level entry     Home Layout: One level Home Equipment: Walker - 2 wheels;Cane - single point;Bedside commode;Shower seat;Hand held shower head;Adaptive equipment Additional Comments: [facility] for rehab    Prior Function Level of Independence: Independent with assistive device(s)      ADL's / Homemaking Assistance Needed: pt ambulated with use of RW  Comments: ambulated with RW, independent with ADL     Hand Dominance   Dominant Hand: Right    Extremity/Trunk Assessment   Upper Extremity Assessment Upper Extremity Assessment: Generalized weakness    Lower Extremity Assessment Lower Extremity Assessment: Generalized weakness RLE Deficits / Details: pt now R AKA, due to pain R hip ROM limited LLE Deficits / Details: grossly 4/5    Cervical / Trunk Assessment Cervical / Trunk Assessment: Kyphotic  Communication   Communication: Tracheostomy;Passy-Muir valve  Cognition Arousal/Alertness: Awake/alert Behavior During Therapy: [facility] LPWFL  for tasks assessed/performed Overall Cognitive Status: Within Functional Limits for tasks assessed                      General Comments General comments (skin integrity, edema, etc.): educated pt on phantom limb pain and how to manage it. encouraged pt to "scratch foot" if it "itched" and to address whatever she is  feeling like her limb is there    Exercises     Assessment/Plan    PT Assessment Patient needs continued PT services  PT Problem List Decreased strength;Decreased range of motion;Decreased activity tolerance;Decreased balance;Decreased mobility;Decreased coordination;Decreased knowledge of use of DME;Decreased safety awareness;Cardiopulmonary status limiting activity          PT Treatment Interventions DME instruction;Gait training;Stair training;Functional mobility training;Therapeutic activities;Therapeutic exercise;Balance training;Neuromuscular re-education;Patient/family education    PT Goals (Current goals can be found in the Care Plan section)  Acute Rehab PT Goals Patient Stated Goal: rehab before home PT Goal Formulation: With patient/family Time For Goal Achievement: 10/13/16 Potential to Achieve Goals: Fair    Frequency Min 3X/week   Barriers to discharge Decreased caregiver support      Co-evaluation PT/OT/SLP Co-Evaluation/Treatment: Yes Reason for Co-Treatment: Complexity of the patient's impairments (multi-system involvement) PT goals addressed during session: Mobility/safety with mobility         End of Session Equipment Utilized During Treatment: Gait belt;Oxygen Activity Tolerance: Patient limited by pain Patient left: in chair;with call bell/phone within reach Nurse Communication: Mobility status         Time: [id number] PT Time Calculation (min) (ACUTE ONLY): 23 min   Charges:   PT Evaluation $PT Re-evaluation: 1 Procedure     PT G Codes:        Norwin Aleman M Zanaya Baize 10/05/2016, 1:37 PM   Lewis ShockAshly Naphtali Zywicki, PT, DPT Pager #: [phone] Office #: [phone]

## 2016-10-05 NOTE — Evaluation (Signed)
Occupational Therapy RE-Evaluation Patient Details Name: [name] MRN: [id number] DOB: 04/28/1946 Today's Date: 10/05/2016    History of Present Illness Pt is a 70 y/o female admitted secondary to AMS, toxic metabolic encephalopathy. PMH including but not limited to chronic trach dependent, CAD, CKD, COPD, PAD, CVA (2013), DM and hx of CABG. 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   Clinical Impression   Pt re-evaluated s/p R AKA. Currently pt requires max assist +2 for basic transfers, min assist for seated UB ADL, and max assist for LB ADL. Pt presenting with pain and poor sitting/standing balance impacting her independence and safety with ADL and functional mobility. D/c plan of SNF for follow up remains appropriate. Will continue to follow acutely.    Follow Up Recommendations  SNF;Supervision/Assistance - 24 hour    Equipment Recommendations  Other (comment) (TBD at next venue)    Recommendations for Other Services       Precautions / Restrictions Precautions Precautions: Fall Precaution Comments: Chronic Trach Restrictions Weight Bearing Restrictions: Yes RLE Weight Bearing: Non weight bearing      Mobility Bed Mobility Overal bed mobility: Needs Assistance Bed Mobility: Supine to Sit     Supine to sit: Mod assist;HOB elevated     General bed mobility comments: Pt able to manage LEs and hips to EOB but required assist for trunk elevation and scooting hips out to EOB.  Transfers Overall transfer level: Needs assistance Equipment used: 2 person hand held assist Transfers: Sit to/from [employer] CorporationStand;Stand Pivot Transfers Sit to Stand: Max assist;+2 physical assistance Stand pivot transfers: Max assist;+2 physical assistance       General transfer comment: Max +2 with use of bed pad for sit to stand from EOB. Max +2 for stand pivot to chair with verbal cues for sequencing.    Balance Overall balance assessment: Needs assistance Sitting-balance support: Feet supported;Bilateral  upper extremity supported Sitting balance-Leahy Scale: Fair     Standing balance support: Bilateral upper extremity supported Standing balance-Leahy Scale: Poor                              ADL Overall ADL's : Needs assistance/impaired Eating/Feeding: Set up;Sitting   Grooming: Minimal assistance;Sitting   Upper Body Bathing: Minimal assistance;Sitting   Lower Body Bathing: Maximal assistance;+2 for physical assistance;Sit to/from stand   Upper Body Dressing : Minimal assistance;Sitting   Lower Body Dressing: Maximal assistance;+2 for physical assistance;Sit to/from stand   Toilet Transfer: BSC;Maximal assistance;+2 for physical assistance;Stand-pivot Toilet Transfer Details (indicate cue type and reason): Simulated by stand pivot to chair         Functional mobility during ADLs: Maximal assistance;+2 for physical assistance (for stand pivot only)       Vision Vision Assessment?: No apparent visual deficits   Perception     Praxis      Pertinent Vitals/Pain Pain Assessment: Faces Faces Pain Scale: Hurts little more Pain Location: R residual limb Pain Descriptors / Indicators: Grimacing;Guarding Pain Intervention(s): Monitored during session;Repositioned     Hand Dominance Right   Extremity/Trunk Assessment Upper Extremity Assessment Upper Extremity Assessment: Generalized weakness   Lower Extremity Assessment Lower Extremity Assessment: Defer to PT evaluation   Cervical / Trunk Assessment Cervical / Trunk Assessment: Kyphotic   Communication Communication Communication: Tracheostomy;Passy-Muir valve   Cognition Arousal/Alertness: Awake/alert Behavior During Therapy: WFL for tasks assessed/performed Overall Cognitive Status: Within Functional Limits for tasks assessed  General Comments       Exercises       Shoulder Instructions      Home Living Family/patient expects to be discharged to:: Skilled  nursing facility                                 Additional Comments: [facility] for rehab      Prior Functioning/Environment Level of Independence: Independent with assistive device(s)        Comments: ambulated with RW, independent with ADL        OT Problem List: Decreased strength;Decreased activity tolerance;Impaired balance (sitting and/or standing);Decreased knowledge of use of DME or AE;Decreased knowledge of precautions;Pain   OT Treatment/Interventions: Self-care/ADL training;Therapeutic exercise;Energy conservation;DME and/or AE instruction;Therapeutic activities;Patient/family education;Balance training    OT Goals(Current goals can be found in the care plan section) Acute Rehab OT Goals Patient Stated Goal: rehab before home OT Goal Formulation: With patient/family Time For Goal Achievement: 10/19/16 Potential to Achieve Goals: Good ADL Goals Pt Will Transfer to Toilet: with min guard assist;stand pivot transfer;bedside commode  OT Frequency: Min 2X/week   Barriers to D/C:            Co-evaluation PT/OT/SLP Co-Evaluation/Treatment: Yes Reason for Co-Treatment: Complexity of the patient's impairments (multi-system involvement);For patient/therapist safety   OT goals addressed during session: ADL's and self-care      End of Session Equipment Utilized During Treatment: Gait belt;Oxygen Nurse Communication: Mobility status;Other (comment) (bleeding from incision)  Activity Tolerance: Patient tolerated treatment well Patient left: in chair;with call bell/phone within reach;with family/visitor present;with nursing/sitter in room   Time: 0819-0850 OT Time Calculation (min): 31 min Charges:  OT General Charges $OT Visit: 1 Procedure OT Evaluation $OT Re-eval: 1 Procedure G-Codes:     Gaye AlkenBailey A Latoi Giraldo M.S., OTR/L Pager: [phone]  10/05/2016, 9:06 AM

## 2016-10-06 DIAGNOSIS — D649 Anemia, unspecified: Secondary | ICD-10-CM

## 2016-10-06 LAB — BASIC METABOLIC PANEL
ANION GAP: 7 (ref 5–15)
BUN: 24 mg/dL — ABNORMAL HIGH (ref 6–20)
CALCIUM: 8 mg/dL — AB (ref 8.9–10.3)
CHLORIDE: 104 mmol/L (ref 101–111)
CO2: 29 mmol/L (ref 22–32)
Creatinine, Ser: 1.32 mg/dL — ABNORMAL HIGH (ref 0.44–1.00)
GFR calc non Af Amer: 40 mL/min — ABNORMAL LOW (ref >60)
GFR, EST AFRICAN AMERICAN: 46 mL/min — AB (ref >60)
GLUCOSE: 95 mg/dL (ref 65–99)
Potassium: 4.2 mmol/L (ref 3.5–5.1)
Sodium: 140 mmol/L (ref 135–145)

## 2016-10-06 LAB — GLUCOSE, CAPILLARY
GLUCOSE-CAPILLARY: 104 mg/dL — AB (ref 65–99)
GLUCOSE-CAPILLARY: 118 mg/dL — AB (ref 65–99)
GLUCOSE-CAPILLARY: 121 mg/dL — AB (ref 65–99)
GLUCOSE-CAPILLARY: 146 mg/dL — AB (ref 65–99)
Glucose-Capillary: 110 mg/dL — ABNORMAL HIGH (ref 65–99)
Glucose-Capillary: 159 mg/dL — ABNORMAL HIGH (ref 65–99)
Glucose-Capillary: 177 mg/dL — ABNORMAL HIGH (ref 65–99)

## 2016-10-06 LAB — CBC
HEMATOCRIT: 23.9 % — AB (ref 36.0–46.0)
HEMOGLOBIN: 7.5 g/dL — AB (ref 12.0–15.0)
MCH: 28.7 pg (ref 26.0–34.0)
MCHC: 31.4 g/dL (ref 30.0–36.0)
MCV: 91.6 fL (ref 78.0–100.0)
Platelets: 177 10*3/uL (ref 150–400)
RBC: 2.61 MIL/uL — ABNORMAL LOW (ref 3.87–5.11)
RDW: 18 % — AB (ref 11.5–15.5)
WBC: 12.7 10*3/uL — AB (ref 4.0–10.5)

## 2016-10-06 LAB — PREPARE RBC (CROSSMATCH)

## 2016-10-06 MED ORDER — FUROSEMIDE 10 MG/ML IJ SOLN
20.0000 mg | Freq: Once | INTRAMUSCULAR | Status: AC
  Administered 2016-10-06: 20 mg via INTRAVENOUS
  Filled 2016-10-06: qty 2

## 2016-10-06 MED ORDER — SODIUM CHLORIDE 0.9 % IV SOLN
Freq: Once | INTRAVENOUS | Status: AC
  Administered 2016-10-06: 10 mL/h via INTRAVENOUS

## 2016-10-06 NOTE — Progress Notes (Signed)
Physical Therapy Treatment Patient Details Name: [name] I Barriere MRN: [id number] DOB: 06/30/1946 Today's Date: 10/06/2016    History of Present Illness Pt is a 70 y/o female admitted secondary to AMS, toxic metabolic encephalopathy. PMH including but not limited to chronic trach dependent, CAD, CKD, COPD, PAD, CVA (2013), DM and hx of CABG. 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    PT Comments    Decreased assistance needed for bed to recliner transfer. Instructed pt in LE exercises, encouraged pt to perform isometric gluteal sets and L ankle pumps for independent exercises.   Follow Up Recommendations  SNF     Equipment Recommendations  None recommended by PT    Recommendations for Other Services       Precautions / Restrictions Precautions Precautions: Fall Precaution Comments: Chronic Trach Restrictions Weight Bearing Restrictions: Yes RLE Weight Bearing: Non weight bearing Other Position/Activity Restrictions: pt s/p R AKA    Mobility  Bed Mobility Overal bed mobility: Needs Assistance Bed Mobility: Supine to Sit     Supine to sit: Mod assist;HOB elevated     General bed mobility comments: pt initiated bringing LEs off EOB, mod A for trunk elevation  Transfers Overall transfer level: Needs assistance Equipment used: None Transfers: Sit to/from [employer] CorporationStand;Stand Pivot Transfers Sit to Stand: +2 safety/equipment;Mod assist Stand pivot transfers: +2 safety/equipment;Mod assist       General transfer comment: Mod A for sit to stand from EOB. Mod +2 for stand pivot ("bear hug" technique) to chair with verbal cues for sequencing.  Ambulation/Gait                 Stairs            Wheelchair Mobility    Modified Rankin (Stroke Patients Only)       Balance Overall balance assessment: Needs assistance Sitting-balance support: Feet supported;Bilateral upper extremity supported Sitting balance-Leahy Scale: Fair                              Cognition  Arousal/Alertness: Awake/alert Behavior During Therapy: WFL for tasks assessed/performed Overall Cognitive Status: Within Functional Limits for tasks assessed                      Exercises General Exercises - Lower Extremity Ankle Circles/Pumps: AROM;Left;15 reps;Supine Gluteal Sets: AROM;Both;10 reps;Seated Hip ABduction/ADduction: Both;10 reps;Supine (isometric adductor pillow squeezes) Straight Leg Raises: AROM;Right;5 reps;Supine    General Comments        Pertinent Vitals/Pain Pain Score: 7  Pain Location: R residual limb Pain Descriptors / Indicators: Sore Pain Intervention(s): Limited activity within patient's tolerance;Monitored during session;Premedicated before session    Home Living                      Prior Function            PT Goals (current goals can now be found in the care plan section) Acute Rehab PT Goals Patient Stated Goal: rehab before home PT Goal Formulation: With patient Time For Goal Achievement: 10/13/16 Potential to Achieve Goals: Fair Progress towards PT goals: Progressing toward goals    Frequency    Min 3X/week      PT Plan Current plan remains appropriate    Co-evaluation             End of Session Equipment Utilized During Treatment: Gait belt;Oxygen Activity Tolerance: Patient tolerated treatment well Patient left: in chair;with call  bell/phone within reach     Time: 1322-1354 PT Time Calculation (min) (ACUTE ONLY): 32 min  Charges:  $Therapeutic Exercise: 8-22 mins $Therapeutic Activity: 8-22 mins                    G Codes:      Ralene BatheUhlenberg, Sandria Mcenroe Kistler 10/06/2016, 2:04 PM [phone]

## 2016-10-06 NOTE — Progress Notes (Signed)
Orthopedic Tech Progress Note Patient Details:  Becky JunDorothy I [name] 05/03/1946 [id number]  Patient ID: Becky Junorothy I [name], female   DOB: 12/29/1945, 70 y.o.   MRN: [id number]   Becky DomCrawford, [name] 10/06/2016, 8:54 AM Called in bio-tech order; spoke with Becky NajjarLarry

## 2016-10-06 NOTE — Progress Notes (Addendum)
  Progress Note    10/06/2016 7:29 AM 2 Days Post-Op  Subjective:  Pain is very well controlled  Tm 100.6 now afebrile VSS 96% TC  Vitals:   10/06/16 0419 10/06/16 0452  BP:  135/61  Pulse: 75 73  Resp: 16 20  Temp:  97.9 F (36.6 C)    Physical Exam: Incisions:  Clean and dry and looks good   CBC    Component Value Date/Time   WBC 12.7 (H) 10/06/2016 0228   RBC 2.61 (L) 10/06/2016 0228   HGB 7.5 (L) 10/06/2016 0228   HCT 23.9 (L) 10/06/2016 0228   PLT 177 10/06/2016 0228   MCV 91.6 10/06/2016 0228   MCH 28.7 10/06/2016 0228   MCHC 31.4 10/06/2016 0228   RDW 18.0 (H) 10/06/2016 0228   LYMPHSABS 0.5 (L) 09/19/2016 2320   MONOABS 1.4 (H) 09/19/2016 2320   EOSABS 0.1 09/19/2016 2320   BASOSABS 0.0 09/19/2016 2320    BMET    Component Value Date/Time   NA 140 10/06/2016 0228   K 4.2 10/06/2016 0228   CL 104 10/06/2016 0228   CO2 29 10/06/2016 0228   GLUCOSE 95 10/06/2016 0228   BUN 24 (H) 10/06/2016 0228   CREATININE 1.32 (H) 10/06/2016 0228   CREATININE 1.59 (H) 05/27/2016 1020   CALCIUM 8.0 (L) 10/06/2016 0228   GFRNONAA 40 (L) 10/06/2016 0228   GFRAA 46 (L) 10/06/2016 0228    INR    Component Value Date/Time   INR 1.22 09/30/2016 0300     Intake/Output Summary (Last 24 hours) at 10/06/16 0729 Last data filed at 10/06/16 0300  Gross per 24 hour  Intake          1136.67 ml  Output             1250 ml  Net          -113.33 ml     Assessment/Plan:  70 y.o. female is s/p right above knee amputation  2 Days Post-Op  -stump is viable -will order retention sock from [employer] today as ace wrap is not going to stay on  -hgb 7.5 and stable from yesterday -mild leukocytosis with fever yesterday afternoon-received peri-op abx -pt to be going to [facility] following, but not seen yesterday   Doreatha MassedSamantha Rhyne, PA-C [facility] [phone] 10/06/2016 7:29 AM     Agree with the above.  I have seen and examined the patient.   Her stump looks good with minimal drainage.  Pain controlled.  D/c to SNF when bed available  Durene CalWells Brabham

## 2016-10-06 NOTE — Progress Notes (Signed)
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 CBG was noted to be 28. There was also some concern for narcotic overdose. She had good response to Narcan. Patient was hospitalized for further management. Patient's mental status improved with improvement in glucose levels. Then patient started complaining of right foot pain. She has a history of peripheral vascular disease, status post bypass. Concern was about ischemia to the right lower extremity. Doppler study showed limited flow. Femoral-popliteal bypass surgery was done on 12/11, later in the day patient has more pain and pulses was undetectable even with Doppler. 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  Reason for Visit: Peripheral vascular disease  Consultants: Vascular surgery  Procedures:  Right lower extremity arterial Dopplers Right ABIs indicate a severe reduction in arterial flow at rest. Unable to obtain all pressures due to venous signals overriding the arterial signals. Left ABI indicates a moderate reduction in arterial flow at rest.  Right femoral endarterectomy with profundoplasty, right femoral to peroneal bypass with propaten PTFE 12/11  Right above-knee amputation on 12/12  Antibiotics: Completed a course of Vantin  Subjective/Interval History: Patient somewhat somnolent this morning, although easily arousable. Denies any complaints.    ROS: Denies any nausea, vomiting.  Objective:  Vital Signs  Vitals:   10/06/16 0419 10/06/16 0452 10/06/16 0727  10/06/16 0728  BP:  135/61    Pulse: 75 73    Resp: 16 20    Temp:  97.9 F (36.6 C)    TempSrc:  Oral    SpO2: 98% 96% 96% 96%  Weight:      Height:        Intake/Output Summary (Last 24 hours) at 10/06/16 0744 Last data filed at 10/06/16 0300  Gross per 24 hour  Intake           721.67 ml  Output             1250 ml  Net          -528.33 ml   Filed Weights   10/04/16 0500 10/05/16 0421 10/05/16 2021  Weight: 75 kg (165 lb 5.5 oz) 74.3 kg (163 lb 11.2 oz) 75.6 kg (166 lb 11.2 oz)    General appearance:  no distress Resp: clear to auscultation bilaterally Cardio: regular rate and rhythm, S1, S2 normal, no murmur, click, rub or gallop GI: soft, non-tender; bowel sounds normal; no masses,  no organomegaly Extremities: Status post right AKA Neurologic: Awake and alert. Oriented 3. No focal neurological deficits  Lab Results:  Data Reviewed: I have personally reviewed following labs and imaging studies  CBC:  Recent Labs Lab 10/04/16 0100 10/04/16 1122 10/04/16 1241 10/05/16 0505 10/05/16 1401 10/06/16 0228  WBC 16.2*  --  14.7*  14.4* 14.2* 11.9* 12.7*  HGB 6.6* 7.1* 8.8*  8.8* 8.0* 7.5* 7.5*  HCT 19.9* 21.0* 26.0*  26.2* 24.4* 23.5* 23.9*  MCV 86.9  --  86.7  86.5 88.7 89.4 91.6  PLT 193  --  199  193 188 178 177    Basic Metabolic Panel:  Recent Labs Lab 10/03/16 0348  10/03/16 1705 10/04/16  0100 10/04/16 1122 10/05/16 0505 10/06/16 0228  NA 138  < > 137 137 135 140 140  K 4.5  < > 5.2* 5.2* 4.8 4.6 4.2  CL 96*  --  101 101  --  107 104  CO2 32  --  28 26  --  25 29  GLUCOSE 96  < > 157* 215* 156* 152* 95  BUN 29*  --  25* 27*  --  25* 24*  CREATININE 1.27*  --  1.27* 1.64*  --  1.31* 1.32*  CALCIUM 9.1  --  8.0* 7.8*  --  7.8* 8.0*  < > = values in this interval not displayed.  GFR: Estimated Creatinine Clearance: 37.8 mL/min (by C-G formula based on SCr of 1.32 mg/dL (H)).  Coagulation Profile:  Recent Labs Lab 09/30/16 0300  INR  1.22    CBG:  Recent Labs Lab 10/05/16 1301 10/05/16 1536 10/05/16 2034 10/06/16 0039 10/06/16 0513  GLUCAP 208* 195* 186* 110* 121*     Medications:  Scheduled: . sodium chloride   Intravenous Once  . aspirin EC  81 mg Oral Daily  . budesonide  0.25 mg Nebulization BID  . carvedilol  12.5 mg Oral BID WC  . chlorhexidine  15 mL Mouth Rinse BID  . dicyclomine  20 mg Oral TID AC  . docusate sodium  100 mg Oral Daily  . DULoxetine  60 mg Oral QHS  . ferrous sulfate  325 mg Oral Q breakfast  . furosemide  20 mg Intravenous Once  . gabapentin  300 mg Oral QHS  . heparin  5,000 Units Subcutaneous Q8H  . hydrALAZINE  25 mg Oral Q8H  . insulin aspart  0-15 Units Subcutaneous Q4H  . insulin glargine  5 Units Subcutaneous Daily  . isosorbide mononitrate  15 mg Oral Daily  . levothyroxine  75 mcg Oral QAC breakfast  . mouth rinse  15 mL Mouth Rinse q12n4p  . multivitamin with minerals  1 tablet Oral Daily  . pantoprazole  40 mg Oral Daily  . simvastatin  40 mg Oral QPM  . tiZANidine  2 mg Oral Daily  . torsemide  40 mg Oral Daily   Continuous: . 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  -S/p vascular surgery to that lower extremity in the past and concern was for occlusion of her old grafts.  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 -Right femoroperoneal bypass done on Monday 12/11. 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 Patient is stable currently. Mild fever noted last night. Slightly elevated WBC, but no further episodes of fever.  RLE DVT -Warfarin discontinued due to  need for angiogram of the lower extremity.   -She had to be given 2.5 mg of vitamin K on 12/2.  Since the leg with DVT has been amputated, it is felt that she no longer needs any Anticoagulation. Heparin has been discontinued.  Normocytic anemia with a component of acute blood loss. Patient had to be transfused blood. Drop in hemoglobin, likely due to surgery. No obvious overt bleeding has been noted.Mild drop in hemoglobin noted this morning. She may benefit from blood transfusion. She'll be ordered 2 units of blood with Lasix in between.  Acute Metabolic Encephalopathy -Etiology is thought to be multifactorial including hypoglycemia and UTI. -Opioid overdose is unlikely as patient is an skilled nursing facility. -Now stable  Diabetes type 2 uncontrolled with complications/Hypoglycemia Patient is on long acting insulin at her skilled nursing facility. She did have evidence for hypoglycemia at presentation and also in the hospital. Patient denies any recent changes to her medication dosage. HbA1c is 8.7. CBGs stabilized. She was started on Lantus at lower dose. CBGs are well controlled at this time. Will need to lower her dose of Tresiba at the time of discharge.   Sepsis secondary to UTI Now resolved. Patient was noted to have leukocytosis, however without fever. Urine culture again grew  Klebsiella. She grew the same bacteria on November 15. Based on discharge summary it appears that she was treated with Keflex. Sensitivities are available. Patient was placed on Vantin and has completed the course. Also, one out of 2 blood cultures is growing strep viridans. This is most likely a contaminant.  Bacteremia with strep viridans 1/2 blood cultures positive for strep viridans. This is most likely a contaminant.    Urinary tract infection with Klebsiella Patient has completed a course of antibiotics with Vantin.  Essential hypertension  Stable. Continue home medications.  Chronic Systolic and Diastolic CHF Compensated. Continue home medications.  Pulmonary hypertension Stable  Chronic respiratory failure (Chronic Tracheostomy) Stable, trach care, mucous plug removed on 12/7 AM by nursing staff.  One Episode of missed beat overnight Patient was asymptomatic. Electrolytes are within normal range. Continue to monitor.   DVT Prophylaxis: Subcutaneous heparin  Code Status: Full code  Family Communication: Discussed with the patient Disposition Plan: Continue to monitor closely. Plan is eventually for her to go to skilled nursing facility when ready for discharge. Discharge when cleared by vascular surgery. Possibly in the next 1-2 days.    LOS: 16 days   [facility]  [facility] Pager [phone] 10/06/2016, 7:44 AM  If 7PM-7AM, please contact night-coverage at [email or web address], password [facility]

## 2016-10-07 LAB — CBC
HCT: 31.1 % — ABNORMAL LOW (ref 36.0–46.0)
Hemoglobin: 10 g/dL — ABNORMAL LOW (ref 12.0–15.0)
MCH: 29.5 pg (ref 26.0–34.0)
MCHC: 32.2 g/dL (ref 30.0–36.0)
MCV: 91.7 fL (ref 78.0–100.0)
PLATELETS: 224 10*3/uL (ref 150–400)
RBC: 3.39 MIL/uL — ABNORMAL LOW (ref 3.87–5.11)
RDW: 17.6 % — AB (ref 11.5–15.5)
WBC: 10.4 10*3/uL (ref 4.0–10.5)

## 2016-10-07 LAB — TYPE AND SCREEN
ABO/RH(D): O POS
Antibody Screen: NEGATIVE
UNIT DIVISION: 0
UNIT DIVISION: 0
UNIT DIVISION: 0
UNIT DIVISION: 0
Unit division: 0
Unit division: 0
Unit division: 0

## 2016-10-07 LAB — BASIC METABOLIC PANEL
Anion gap: 6 (ref 5–15)
BUN: 24 mg/dL — AB (ref 6–20)
CO2: 28 mmol/L (ref 22–32)
CREATININE: 1.22 mg/dL — AB (ref 0.44–1.00)
Calcium: 8 mg/dL — ABNORMAL LOW (ref 8.9–10.3)
Chloride: 102 mmol/L (ref 101–111)
GFR calc Af Amer: 51 mL/min — ABNORMAL LOW (ref >60)
GFR, EST NON AFRICAN AMERICAN: 44 mL/min — AB (ref >60)
Glucose, Bld: 170 mg/dL — ABNORMAL HIGH (ref 65–99)
Potassium: 4.2 mmol/L (ref 3.5–5.1)
SODIUM: 136 mmol/L (ref 135–145)

## 2016-10-07 LAB — GLUCOSE, CAPILLARY
GLUCOSE-CAPILLARY: 128 mg/dL — AB (ref 65–99)
Glucose-Capillary: 106 mg/dL — ABNORMAL HIGH (ref 65–99)
Glucose-Capillary: 220 mg/dL — ABNORMAL HIGH (ref 65–99)

## 2016-10-07 MED ORDER — GUAIFENESIN-DM 100-10 MG/5ML PO SYRP: 15.0000 mL | ORAL_SOLUTION | ORAL | 0 refills | Status: AC | PRN

## 2016-10-07 MED ORDER — OXYCODONE-ACETAMINOPHEN 5-325 MG PO TABS: 1.0000 | ORAL_TABLET | ORAL | 0 refills | Status: AC | PRN

## 2016-10-07 MED ORDER — INSULIN ASPART 100 UNIT/ML ~~LOC~~ SOLN
0.0000 [IU] | Freq: Three times a day (TID) | SUBCUTANEOUS | Status: DC
  Administered 2016-10-07: 5 [IU] via SUBCUTANEOUS

## 2016-10-07 MED ORDER — ALUM & MAG HYDROXIDE-SIMETH 200-200-20 MG/5ML PO SUSP: 15.0000 mL | ORAL | 0 refills | Status: AC | PRN

## 2016-10-07 NOTE — Consult Note (Signed)
   [facility]   10/07/2016  Scot JunDorothy I Neary 03/11/1946 [id number]   Chart reviewed. Not[facility].    Raiford NobleAtika Devaun Hernandez, MSN-Ed, RN,BSN [facility] Hospital Liaison [phone]

## 2016-10-07 NOTE — Progress Notes (Signed)
OT Cancellation Note  Patient Details Name: [name] MRN: [id number] DOB: 08/29/1946   Cancelled Treatment:    Reason Eval/Treat Not Completed: Patient declined, no reason specified. Despite max verbal encouragement pt reports "I just dont feel like doing anything right now". Will follow up as time allows.  Gaye AlkenBailey A Keyani Rigdon M.S., OTR/L Pager: [phone]  10/07/2016, 11:06 AM

## 2016-10-07 NOTE — Progress Notes (Signed)
Physical Therapy Treatment Patient Details Name: Scot JunDorothy I Bathe MRN: [id number] DOB: 12/12/1945 Today's Date: 10/07/2016    History of Present Illness Pt is a 70 y/o female admitted secondary to AMS, toxic metabolic encephalopathy. PMH including but not limited to chronic trach dependent, CAD, CKD, COPD, PAD, CVA (2013), DM and hx of CABG. 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    PT Comments    Patient initially resistant to therapy, but agreed to perform exercises. Patient fatigued quickly and required rest breaks, especially during hip adduction. Patient notes that she was not willing to transfer to chair today. Will continue to follow.    Follow Up Recommendations  SNF     Equipment Recommendations       Recommendations for Other Services       Precautions / Restrictions Precautions Precautions: Fall Precaution Comments: Chronic Trach Restrictions Other Position/Activity Restrictions: pt s/p R AKA    Mobility  Bed Mobility Overal bed mobility: Needs Assistance Bed Mobility: Rolling Rolling: Min guard         General bed mobility comments: cues for hand placement, rail use and sequence. +2 mod to scoot toward HOB. Pt denied any further mobility  Transfers                 General transfer comment: pt refused  Ambulation/Gait                 Stairs            Wheelchair Mobility    Modified Rankin (Stroke Patients Only)       Balance                                    Cognition Arousal/Alertness: Awake/alert Behavior During Therapy: WFL for tasks assessed/performed Overall Cognitive Status: Within Functional Limits for tasks assessed                      Exercises Amputee Exercises Gluteal Sets: AROM;Both;Supine;10 reps Hip Extension: AROM;Right;Sidelying;10 reps Hip ABduction/ADduction: AROM;Right;10 reps;Supine    General Comments        Pertinent Vitals/Pain Pain Assessment: No/denies pain    Home Living                       Prior Function            PT Goals (current goals can now be found in the care plan section) Progress towards PT goals: Progressing toward goals (limited by pt willingness for HEP only)    Frequency           PT Plan Current plan remains appropriate    Co-evaluation             End of Session Equipment Utilized During Treatment: Oxygen Activity Tolerance: Patient tolerated treatment well Patient left: in bed;with call bell/phone within reach;with bed alarm set     Time: [id number] PT Time Calculation (min) (ACUTE ONLY): 22 min  Charges:  $Therapeutic Exercise: 8-22 mins                    G Codes:      Darshawn Boateng 10/07/2016, 11:57 AM  Lunabelle Oatley SPT [phone]

## 2016-10-07 NOTE — Progress Notes (Signed)
Clinical Social Worker facilitated patient discharge including contacting patient family and facility to confirm patient discharge plans.  Clinical information faxed to facility and family agreeable with plan.  [facility]  RN Luther ParodyCaitlin to call [phone] for report prior to discharge.  Clinical Social Worker will sign off for now as social work intervention is no longer needed. Please consult us again if new need arises.  Marrianne MoodAshley Brealyn Baril, MSW, [employer]CSWA [phone]

## 2016-10-07 NOTE — Progress Notes (Signed)
Foley catheter removed at 10:15am. Pt due to void by 4:15pm or prior to discharge to SNF. Pt and NT made aware. Call light within reach, will continue to monitor.   Leonidas Rombergaitlin S Bumbledare, RN

## 2016-10-07 NOTE — Progress Notes (Addendum)
  [facility] Progress Note  Subjective  - POD #3  No complaints.   Objective Vitals:   10/07/16 0303 10/07/16 0434  BP:  (!) 161/63  Pulse: 71 76  Resp: 16 18  Temp:  98.1 F (36.7 C)    Intake/Output Summary (Last 24 hours) at 10/07/16 0738 Last data filed at 10/07/16 0442  Gross per 24 hour  Intake              635 ml  Output             2100 ml  Net            -1465 ml   Right AKA healing well. Minimal swelling.  Serosanguinous drainage on retention stocking. No active drainage.   Assessment/Planning: 70 y.o. female is s/p: right above knee amputation 3 Days Post-Op   Right AKA healing well. Anemia improved after 2 units of prbcs yesterday.  Afebrile, no leukocytosis.  Ok to d/c to SNF from vascular standpoint.  F/u in 4 weeks for staple removal with Dr. Darrick PennaFields.   Raymond GurneyKimberly A Harlem [name] 10/07/2016 7:38 AM --  Laboratory CBC    Component Value Date/Time   WBC 10.4 10/07/2016 0140   HGB 10.0 (L) 10/07/2016 0140   HCT 31.1 (L) 10/07/2016 0140   PLT 224 10/07/2016 0140    BMET    Component Value Date/Time   NA 136 10/07/2016 0140   K 4.2 10/07/2016 0140   CL 102 10/07/2016 0140   CO2 28 10/07/2016 0140   GLUCOSE 170 (H) 10/07/2016 0140   BUN 24 (H) 10/07/2016 0140   CREATININE 1.22 (H) 10/07/2016 0140   CREATININE 1.59 (H) 05/27/2016 1020   CALCIUM 8.0 (L) 10/07/2016 0140   GFRNONAA 44 (L) 10/07/2016 0140   GFRAA 51 (L) 10/07/2016 0140    COAG Lab Results  Component Value Date   INR 1.22 09/30/2016   INR 1.25 09/29/2016   INR 1.28 09/28/2016   No results found for: PTT  Antibiotics Anti-infectives    Start     Dose/Rate Route Frequency Ordered Stop   10/04/16 2300  cefUROXime (ZINACEF) 1.5 g in dextrose 5 % 50 mL IVPB     1.5 g 100 mL/hr over 30 Minutes Intravenous Every 12 hours 10/04/16 1424 10/05/16 1051   10/03/16 2030  cefUROXime (ZINACEF) 1.5 g in dextrose 5 % 50 mL IVPB     1.5 g 100 mL/hr over 30 Minutes  Intravenous Every 12 hours 10/03/16 1923 10/04/16 1110   10/03/16 0800  cefUROXime (ZINACEF) 1.5 g in dextrose 5 % 50 mL IVPB     1.5 g 100 mL/hr over 30 Minutes Intravenous To ShortStay Procedural 09/30/16 1040 10/03/16 0840   09/22/16 1200  cefpodoxime (VANTIN) tablet 200 mg     200 mg Oral Every 12 hours 09/22/16 1050 09/28/16 0828   09/22/16 0000  cefpodoxime (VANTIN) 200 MG tablet     200 mg Oral Every 12 hours 09/22/16 1053     09/21/16 0600  cefTRIAXone (ROCEPHIN) 2 g in dextrose 5 % 50 mL IVPB  Status:  Discontinued     2 g 100 mL/hr over 30 Minutes Intravenous Every 24 hours 09/21/16 0511 09/22/16 1050       Becky BergerKimberly George Haggart, PA-C [facility] Office: [phone] Pager: [phone] 10/07/2016 7:38 AM

## 2016-10-07 NOTE — Care Management Note (Signed)
Case Management Note Original Note created by Letha Capeeborah Taylor  Patient Details  Name: [name] MRN: [id number] Date of Birth: 08/19/1946  Subjective/Objective:  S/p R AKA on 12/12, she has chronic Trach on trach collar [facility] but [facility].  CSW following for placement.                  Action/Plan:   Expected Discharge Date:                  Expected Discharge Plan:  Skilled Nursing Facility  In-House Referral:  Clinical Social Work  Discharge planning Services  CM Consult  Post Acute Care Choice:    Choice offered to:     DME Arranged:    DME Agency:     HH Arranged:    HH Agency:     Status of Service:  Completed, signed off  If discussed at [employer] Meetings, dates discussed:    Additional Comments: Pt is tentative discharge today for SNF.  CSW actively following Cherylann ParrClaxton, Marycarmen Hagey S, RN 10/07/2016, 10:04 AM

## 2016-10-07 NOTE — Progress Notes (Signed)
IV removed. Telemetry removed, CCMD notified. R AKA re-dressed. [facility] - all questions answered.   Leonidas Rombergaitlin S Bumbledare, RN

## 2016-10-07 NOTE — Progress Notes (Signed)
Clinical Social Worker met patient at bedside.. CSW made patient aware that she will be discharging today to [facility]. Patient was excited and stated she is ready to leave the hospital. CSW contacted patient's daughter via phone to let her know patient is discharging today. Patient is agreeable to go to facility by [facility]. CSW remains available for support and discharge needs.    , MSW,  LCSWA [phone]   

## 2016-10-07 NOTE — Discharge Instructions (Signed)

## 2016-10-07 NOTE — Discharge Summary (Signed)
[facility]  Physician Discharge Summary   Patient ID: [name] MRN: [id number] DOB/AGE: 70/06/1946 70 y.o.  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 CBC and basic metabolic panel in 1 week 2. Follow-up with Dr. 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 CBG was noted to be 28. There was also some concern for narcotic overdose. She had good response to Narcan. Patient was hospitalized for further management. Patient's mental status improved with improvement in glucose levels. Then patient started complaining of right foot pain. She has a history of peripheral vascular disease, status post bypass. Concern was about ischemia to the right lower extremity. Doppler study showed limited flow. Femoral-popliteal bypass surgery was done on 12/11, later in the day patient has more pain and pulses  was undetectable even with Doppler. 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  Consultants: Vascular surgery  Procedures:  Right lower extremity arterial Dopplers Right ABIs indicate a severe reduction in arterial flow at rest. Unable to obtain all pressures due to venous signals overriding the arterial signals. Left ABI indicates a moderate reduction in arterial flow at rest.  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  -S/p vascular surgery to that lower extremity in the past and concern was for occlusion of her old grafts.  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 -Right femoroperoneal bypass done on Monday 12/11. 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 Patient is stable currently. Cleared by vascular surgery.  RLE DVT -Patient was on warfarin when she was hospitalized. Her DVT was diagnosed recently. Warfarin was stopped due to need for angiogram. She was on heparin for bridging. Unfortunately, bypass was not successful, so patient required amputation of her right leg. Since that was the leg which had the DVT, she no longer needs anticoagulation.  Normocytic anemia with a component of acute blood loss. Patient did require a transfusion during this hospitalization. Most of the drop in her hemoglobin was dilutional, however, patient did have surgery and so there was an element of operative blood loss as well. Hemoglobin  has responded appropriately. We will recommend continuing to monitor it at the skilled nursing facility.   Acute Metabolic  Encephalopathy Etiology is thought to be multifactorial including hypoglycemia and UTI. Opioid overdose is unlikely as patient is an skilled nursing facility. Now back to baseline.  Diabetes type 2 uncontrolled with complications/Hypoglycemia Patient is on long acting insulin at her skilled nursing facility. She did have evidence for hypoglycemia at presentation and also in the hospital. Patient denies any recent changes to her medication dosage. HbA1c is 8.7. CBGs stabilized. She was started on Lantus at lower dose. CBGs are well controlled at this time. We will recommend reducing the dose of Tresiba at the time of discharge.   Sepsis secondary to UTI Now resolved. Patient was noted to have leukocytosis, however without fever. Urine culture again grew Klebsiella. She grew the same bacteria on November 15. Based on discharge summary it appears that she was treated with Keflex. Sensitivities are available. Patient was placed on Vantin and has completed the course. Also, one out of 2 blood cultures is growing strep viridans. This is most likely a contaminant.  Bacteremia with strep viridans 1/2 blood cultures positive for strep viridans. This is likely a contaminant.    Urinary tract infection with Klebsiella Patient has completed a course of antibiotics with Vantin.  Essential hypertension  Stable. Continue home medications.  Chronic Systolic and Diastolic CHF Compensated. Continue home medications.  Pulmonary hypertension Stable  Chronic respiratory failure (Chronic Tracheostomy) Stable, trach care, mucous plug removed on 12/7 AM by nursing staff.  Episode of missed beat overnight Patient was asymptomatic. Electrolytes are within normal range.   Overall remains stable. Okay for discharge back to skilled nursing facility today.    PERTINENT LABS:  The results of significant diagnostics from this hospitalization (including imaging, microbiology, ancillary and laboratory)  are listed below for reference.     Labs: Basic Metabolic Panel:  Recent Labs Lab 10/03/16 1705 10/04/16 0100 10/04/16 1122 10/05/16 0505 10/06/16 0228 10/07/16 0140  NA 137 137 135 140 140 136  K 5.2* 5.2* 4.8 4.6 4.2 4.2  CL 101 101  --  107 104 102  CO2 28 26  --  25 29 28   GLUCOSE 157* 215* 156* 152* 95 170*  BUN 25* 27*  --  25* 24* 24*  CREATININE 1.27* 1.64*  --  1.31* 1.32* 1.22*  CALCIUM 8.0* 7.8*  --  7.8* 8.0* 8.0*   CBC:  Recent Labs Lab 10/04/16 1241 10/05/16 0505 10/05/16 1401 10/06/16 0228 10/07/16 0140  WBC 14.7*  14.4* 14.2* 11.9* 12.7* 10.4  HGB 8.8*  8.8* 8.0* 7.5* 7.5* 10.0*  HCT 26.0*  26.2* 24.4* 23.5* 23.9* 31.1*  MCV 86.7  86.5 88.7 89.4 91.6 91.7  PLT 199  193 188 178 177 224    CBG:  Recent Labs Lab 10/06/16 1633 10/06/16 2031 10/06/16 2343 10/07/16 0433 10/07/16 0751  GLUCAP 177* 159* 146* 128* 106*     IMAGING STUDIES Ct Head Wo Contrast  Result Date: 09/20/2016 CLINICAL DATA:  Altered mental status EXAM: CT HEAD WITHOUT CONTRAST TECHNIQUE: Contiguous axial images were obtained from the base of the skull through the vertex without intravenous contrast. COMPARISON:  Brain MRI 09/06/2016 FINDINGS: Brain: No mass lesion, intraparenchymal hemorrhage or extra-axial collection. No evidence of acute cortical infarct. Brain parenchyma and CSF-containing spaces are unremarkable for age. Vascular: No hyperdense vessel or unexpected calcification. Skull: Normal visualized skull base, calvarium and extracranial soft tissues. Sinuses/Orbits: No sinus fluid levels or  advanced mucosal thickening. No mastoid effusion. Normal orbits. IMPRESSION: No acute intracranial abnormality. Electronically Signed   By: Deatra Robinson M.D.   On: 09/20/2016 00:47   Dg Chest Port 1 View  Result Date: 09/29/2016 CLINICAL DATA:  Dyspnea this morning EXAM: PORTABLE CHEST 1 VIEW COMPARISON:  09/20/2016 FINDINGS: Tracheostomy appliance appears since  satisfactorily positioned. Unchanged moderate cardiomegaly. No airspace consolidation. No effusions. IMPRESSION: Moderate cardiomegaly.  No consolidation or effusion. Electronically Signed   By: Ellery Plunk M.D.   On: 09/29/2016 05:05   Dg Chest Portable 1 View  Result Date: 09/20/2016 CLINICAL DATA:  Altered mental status.  Shortness of breath. EXAM: PORTABLE CHEST 1 VIEW COMPARISON:  09/08/2016 FINDINGS: Postoperative changes in the mediastinum. Tracheostomy. Shallow inspiration. Cardiac enlargement with bilateral interstitial infiltration consistent with interstitial edema. Findings are similar to previous study. No pleural effusions. No pneumothorax. Calcified and tortuous aorta. IMPRESSION: Shallow inspiration. Cardiac enlargement with diffuse pulmonary edema. Electronically Signed   By: Burman Nieves M.D.   On: 09/20/2016 00:25   Dg Chest Port 1 View  Result Date: 09/08/2016 CLINICAL DATA:  Shortness of breath. EXAM: PORTABLE CHEST 1 VIEW COMPARISON:  09/05/2016 and prior radiographs FINDINGS: Cardiomegaly and cardiac surgical changes again noted. Pulmonary vascular congestion and interstitial opacities are again noted -likely representing interstitial edema. A tracheostomy tube is again identified. There has been little interval change since the prior study. There is no evidence of pneumothorax or large pleural effusion IMPRESSION: Cardiomegaly with pulmonary vascular congestion and unchanged probable interstitial pulmonary edema. Electronically Signed   By: Harmon Pier M.D.   On: 09/08/2016 15:17   Dg Foot 2 Views Right  Result Date: 09/22/2016 CLINICAL DATA:  Pain in the right heel for 2 weeks, diabetes, no trauma EXAM: RIGHT FOOT - 2 VIEW COMPARISON:  None FINDINGS: No fracture is seen. No bony erosion is noted. Small calcaneal degenerative spurs are present. There is degenerative change of the right first MTP joint. No erosion is noted. The bones are slightly osteopenic some  irregularity of the proximal phalanx of the right fifth toe is noted and clinical correlation is recommended to exclude acute abnormality. IMPRESSION: 1. Small calcaneal degenerative spurs.  No acute abnormality. 2. Slight irregularity of the proximal phalanx of the right fifth toe. Correlate clinically. Electronically Signed   By: Dwyane Dee M.D.   On: 09/22/2016 11:08    DISCHARGE EXAMINATION: Vitals:   10/06/16 2329 10/07/16 0303 10/07/16 0434 10/07/16 0809  BP:   (!) 161/63   Pulse: 72 71 76 73  Resp: 16 16 18 16   Temp:   98.1 F (36.7 C)   TempSrc:   Oral   SpO2: 97% 98% 95% 93%  Weight:   72.1 kg (159 lb)   Height:       General appearance: alert, cooperative, appears stated age and no distress Resp: clear to auscultation bilaterally Cardio: regular rate and rhythm, S1, S2 normal, no murmur, click, rub or gallop GI: soft, non-tender; bowel sounds normal; no masses,  no organomegaly Extremities: extremities normal, atraumatic, no cyanosis or edema   DISPOSITION: SNF  Discharge Instructions    Call MD for:  extreme fatigue    Complete by:  As directed    Call MD for:  persistant dizziness or light-headedness    Complete by:  As directed    Call MD for:  persistant nausea and vomiting    Complete by:  As directed    Call MD for:  redness, tenderness, or  signs of infection (pain, swelling, redness, odor or green/yellow discharge around incision site)    Complete by:  As directed    Call MD for:  severe uncontrolled pain    Complete by:  As directed    Call MD for:  temperature >100.4    Complete by:  As directed    Discharge instructions    Complete by:  As directed    See instructions on the discharge summary.  You were cared for by a hospitalist during your hospital stay. If you have any questions about your discharge medications or the care you received while you were in the hospital after you are discharged, you can call the unit and asked to speak with the  hospitalist on call if the hospitalist that took care of you is not available. Once you are discharged, your primary care physician will handle any further medical issues. Please note that NO REFILLS for any discharge medications will be authorized once you are discharged, as it is imperative that you return to your primary care physician (or establish a relationship with a primary care physician if you do not have one) for your aftercare needs so that they can reassess your need for medications and monitor your lab values. If you do not have a primary care physician, you can call [phone] for a physician referral.   Increase activity slowly    Complete by:  As directed       ALLERGIES:  Allergies  Allergen Reactions  . Aldactone [Spironolactone] Other (See Comments)    Severe hyperkalemia   . Lisinopril Other (See Comments) and Cough    Hypotension also  . Crestor [Rosuvastatin Calcium] Other (See Comments)    Muscle Pain  . Vicodin [Hydrocodone-Acetaminophen] Nausea And Vomiting     Allergies as of 10/07/2016      Reactions   Aldactone [spironolactone] Other (See Comments)   Severe hyperkalemia   Lisinopril Other (See Comments), Cough   Hypotension also   Crestor [rosuvastatin Calcium] Other (See Comments)   Muscle Pain   Vicodin [hydrocodone-acetaminophen] Nausea And Vomiting      Medication List    STOP taking these medications   HYDROcodone-acetaminophen 5-325 MG tablet Commonly known as:  NORCO/VICODIN   traMADol 50 MG tablet Commonly known as:  ULTRAM   warfarin 5 MG tablet Commonly known as:  COUMADIN     TAKE these medications   acetaminophen 325 MG tablet Commonly known as:  TYLENOL Take 2 tablets (650 mg total) by mouth every 4 (four) hours as needed for mild pain or fever (give with each dose Ultram).   albuterol (2.5 MG/3ML) 0.083% nebulizer solution Commonly known as:  PROVENTIL Take 3 mLs (2.5 mg total) by nebulization every 2 (two) hours as needed  for wheezing or shortness of breath.   alum & mag hydroxide-simeth 200-200-20 MG/5ML suspension Commonly known as:  MAALOX/MYLANTA Take 15-30 mLs by mouth every 2 (two) hours as needed for indigestion.   aspirin EC 81 MG tablet Take 81 mg by mouth daily.   budesonide 0.25 MG/2ML nebulizer solution Commonly known as:  PULMICORT Take 2 mLs (0.25 mg total) by nebulization 2 (two) times daily.   carvedilol 12.5 MG tablet Commonly known as:  COREG Take 1 tablet (12.5 mg total) by mouth 2 (two) times daily with a meal.   [employer] GLUCOSE 4 GM chewable tablet Generic drug:  glucose Chew 1 tablet by mouth daily as needed for low blood sugar.   dicyclomine 20 MG  tablet Commonly known as:  BENTYL Take 20 mg by mouth 3 (three) times daily before meals.   DULoxetine 60 MG capsule Commonly known as:  CYMBALTA Take 60 mg by mouth at bedtime.   ferrous sulfate 325 (65 FE) MG tablet Take 325 mg by mouth daily with breakfast.   gabapentin 300 MG capsule Commonly known as:  NEURONTIN Take 1 capsule (300 mg total) by mouth at bedtime.   guaiFENesin-dextromethorphan 100-10 MG/5ML syrup Commonly known as:  ROBITUSSIN DM Take 15 mLs by mouth every 4 (four) hours as needed for cough.   hydrALAZINE 25 MG tablet Commonly known as:  APRESOLINE Take 1 tablet (25 mg total) by mouth 3 (three) times daily.   insulin degludec 100 UNIT/ML Sopn FlexTouch Pen Commonly known as:  TRESIBA FLEXTOUCH Inject 0.06 mLs (6 Units total) into the skin at bedtime. What changed:  how much to take   ipratropium-albuterol 0.5-2.5 (3) MG/3ML Soln Commonly known as:  DUONEB Take 3 mLs by nebulization 2 (two) times daily as needed (for shortnes of breath).   isosorbide mononitrate 30 MG 24 hr tablet Commonly known as:  IMDUR Take 30 mg by mouth daily.   lansoprazole 30 MG capsule Commonly known as:  PREVACID Take 30 mg by mouth 2 (two) times daily.   levothyroxine 75 MCG tablet Commonly known as:  SYNTHROID,  LEVOTHROID Take 75 mcg by mouth daily before breakfast.   nitroGLYCERIN 0.4 MG SL tablet Commonly known as:  NITROSTAT Place 0.4 mg under the tongue every 5 (five) minutes as needed for chest pain (x 3 doses). For chest pain   NOVOLOG FLEXPEN 100 UNIT/ML FlexPen Generic drug:  insulin aspart 70 -199 TAKE 12 UNITS, 200-299 TAKE 14 UNITS, 300 OR HIGHER 16 UNITS SUBCUTANEOUS (12-16 units subcutaneously three (3) times a day)   oxyCODONE-acetaminophen 5-325 MG tablet Commonly known as:  PERCOCET/ROXICET Take 1-2 tablets by mouth every 4 (four) hours as needed for moderate pain.   OXYGEN Inhale 4 L into the lungs at bedtime.   polyethylene glycol packet Commonly known as:  MIRALAX / GLYCOLAX Take 17 g by mouth daily.   PROBIOTIC PO Take 250 mg by mouth daily.   senna-docusate 8.6-50 MG tablet Commonly known as:  Senokot-S Take 2 tablets by mouth at bedtime.   simvastatin 40 MG tablet Commonly known as:  ZOCOR Take 40 mg by mouth every evening.   tiZANidine 2 MG tablet Commonly known as:  ZANAFLEX Take 1 tablet (2 mg total) by mouth daily.   torsemide 20 MG tablet Commonly known as:  DEMADEX Take 40 mg by mouth daily.   WOMENS 50+ MULTI VITAMIN/MIN Tabs Take 1 tablet by mouth daily.        Follow-up Information    Fabienne Bruns, MD Follow up in 4 week(s).   Specialties:  Vascular Surgery, Cardiology Why:  Our office will call you to arrange an appointment  Contact information: [address] [place] [address] [phone]        Cala Bradford, MD Follow up in 2 week(s).   Specialty:  Family Medicine Contact information: [phone] [address] [place] [address] [phone]           TOTAL DISCHARGE TIME: 35 minutes  [facility]  [facility] Pager [phone]  10/07/2016, 11:18 AM

## 2016-10-11 ENCOUNTER — Other Ambulatory Visit: Payer: Self-pay | Admitting: Podiatry

## 2016-10-11 DIAGNOSIS — E0849 Diabetes mellitus due to underlying condition with other diabetic neurological complication: Secondary | ICD-10-CM

## 2016-10-11 NOTE — Telephone Encounter (Signed)
Pt needs an appt prior to future refills. 

## 2016-10-24 DEATH — deceased

## 2016-10-27 ENCOUNTER — Ambulatory Visit: Payer: Medicare Other | Admitting: Family

## 2016-10-27 ENCOUNTER — Encounter (HOSPITAL_COMMUNITY): Payer: Medicare Other

## 2016-10-27 ENCOUNTER — Other Ambulatory Visit (HOSPITAL_COMMUNITY): Payer: Medicare Other

## 2016-11-10 ENCOUNTER — Encounter: Payer: Medicare Other | Admitting: Vascular Surgery

## 2016-12-22 ENCOUNTER — Encounter: Payer: Self-pay | Admitting: *Deleted

## 2016-12-22 NOTE — Progress Notes (Signed)
[name], [facility], [facility] stated the Death Certificate received for decedent, Alisa GraffDorothy Gunderman, had been returned by the [facility]. Per state instructions: Provide physician statement verification on physician's letterhead with the physician's signature: Decedent: [name] DOD 10/22/16 #25. Manner of Death is marked "Cannot be determined". This box can only be marded by a medical examiner. Dr. Alwyn RenHopper please provide a signed statement that specifies another choice or if you are a ME please specify. Statement to be faxed to Fax#: [phone] Dennis BastJoyce Johnson [facility]([place] [facility]) [phone]

## 2017-08-07 IMAGING — DX DG CHEST 1V PORT
1 series · 1 of 1 positions shown · non-contrast
Comparison: 09/08/2016

CLINICAL DATA: Altered mental status.  Shortness of breath.

EXAM:
PORTABLE CHEST 1 VIEW

[chest ap]
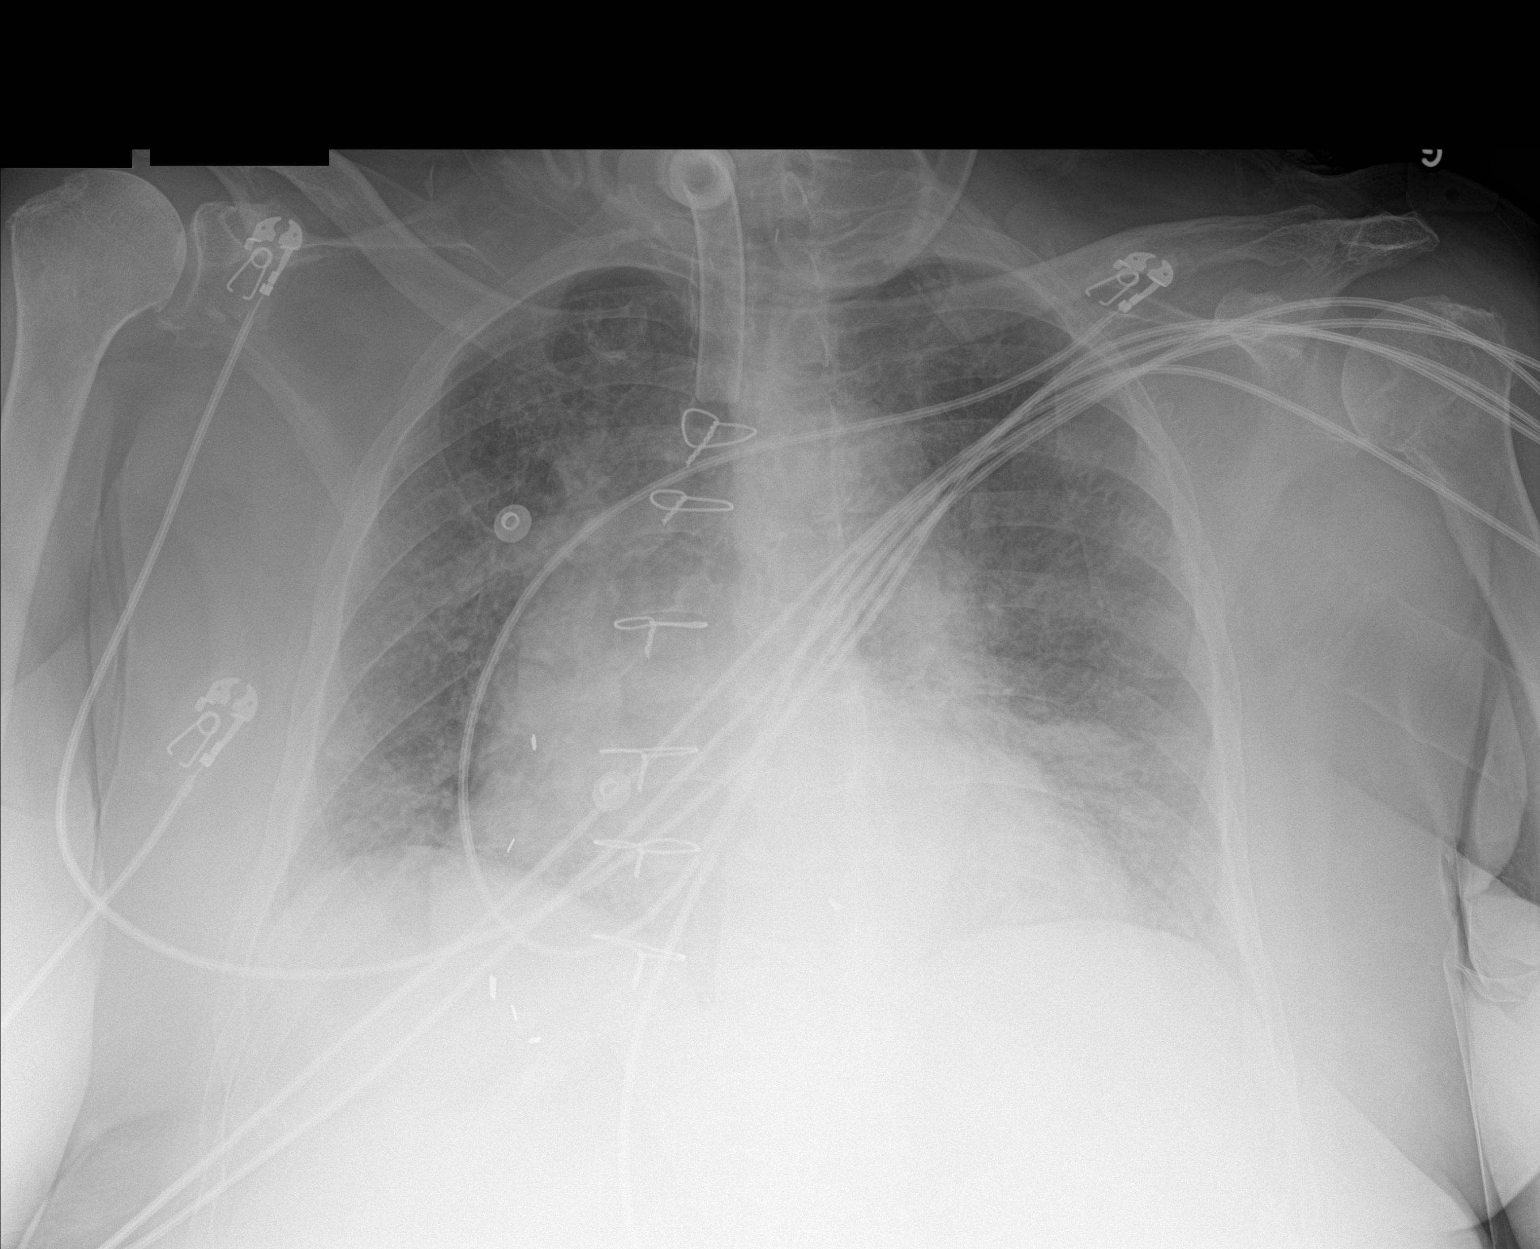

[1 of 1 positions shown; findings below may reference images not displayed]

FINDINGS: Postoperative changes in the mediastinum. Tracheostomy. Shallow
inspiration. Cardiac enlargement with bilateral interstitial
infiltration consistent with interstitial edema. Findings are
similar to previous study. No pleural effusions. No pneumothorax.
Calcified and tortuous aorta.
IMPRESSION: Shallow inspiration. Cardiac enlargement with diffuse pulmonary
edema.

## 2017-08-09 IMAGING — DX DG FOOT 2V*R*
2 series · 2 of 2 positions shown · non-contrast
Comparison: None

CLINICAL DATA: Pain in the right heel for 2 weeks, diabetes, no
trauma

EXAM:
RIGHT FOOT - 2 VIEW

[foot ap]
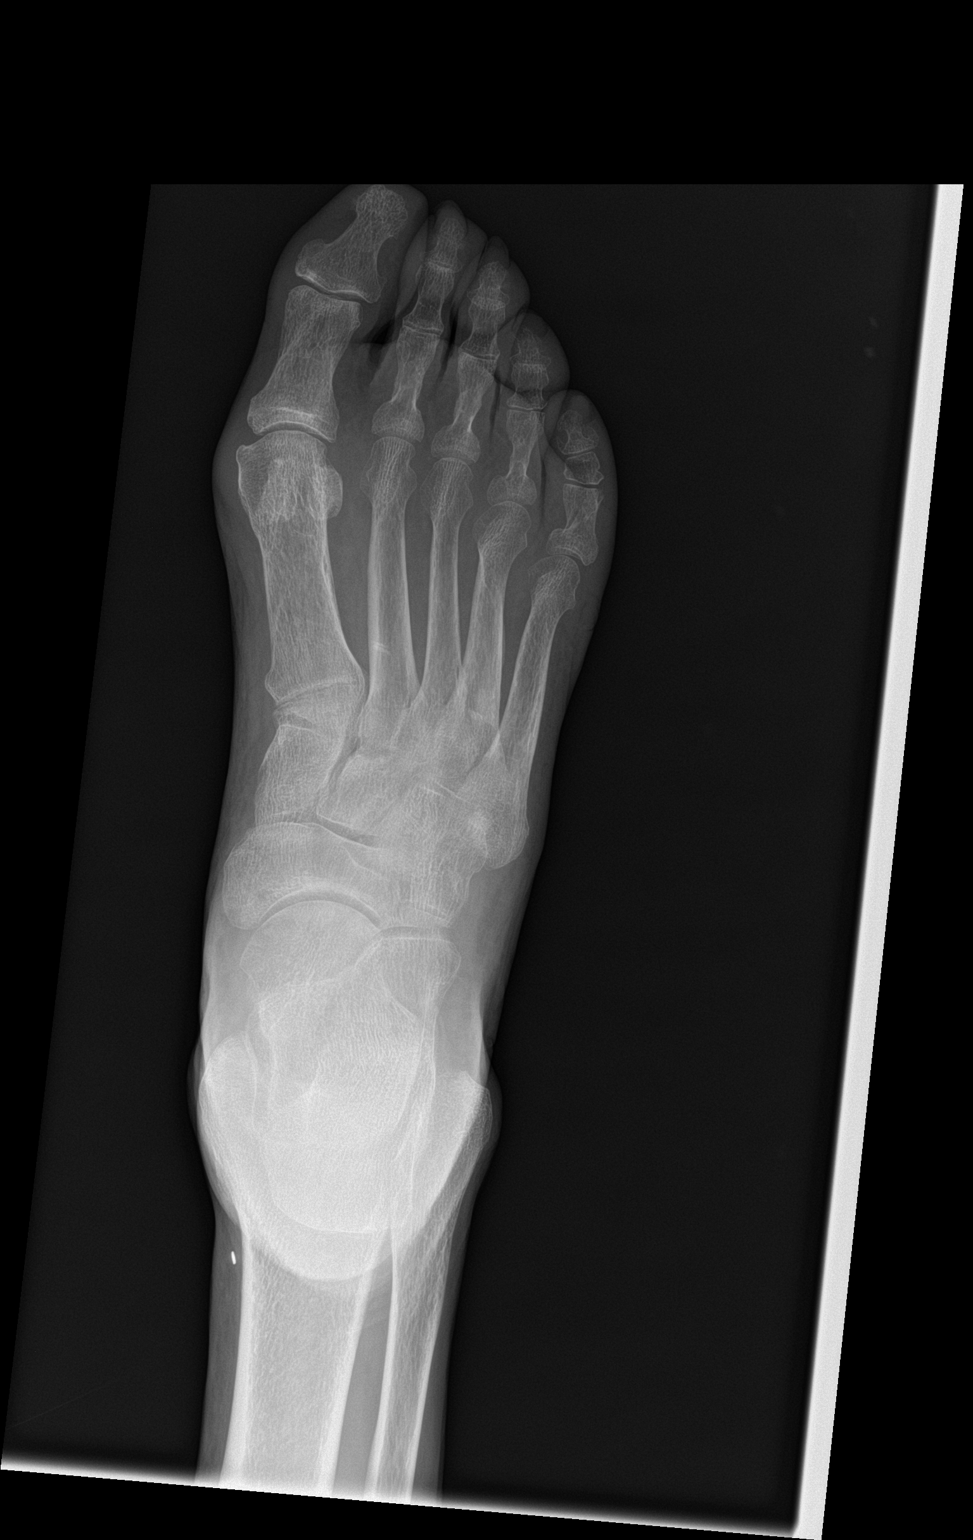

[foot lat]
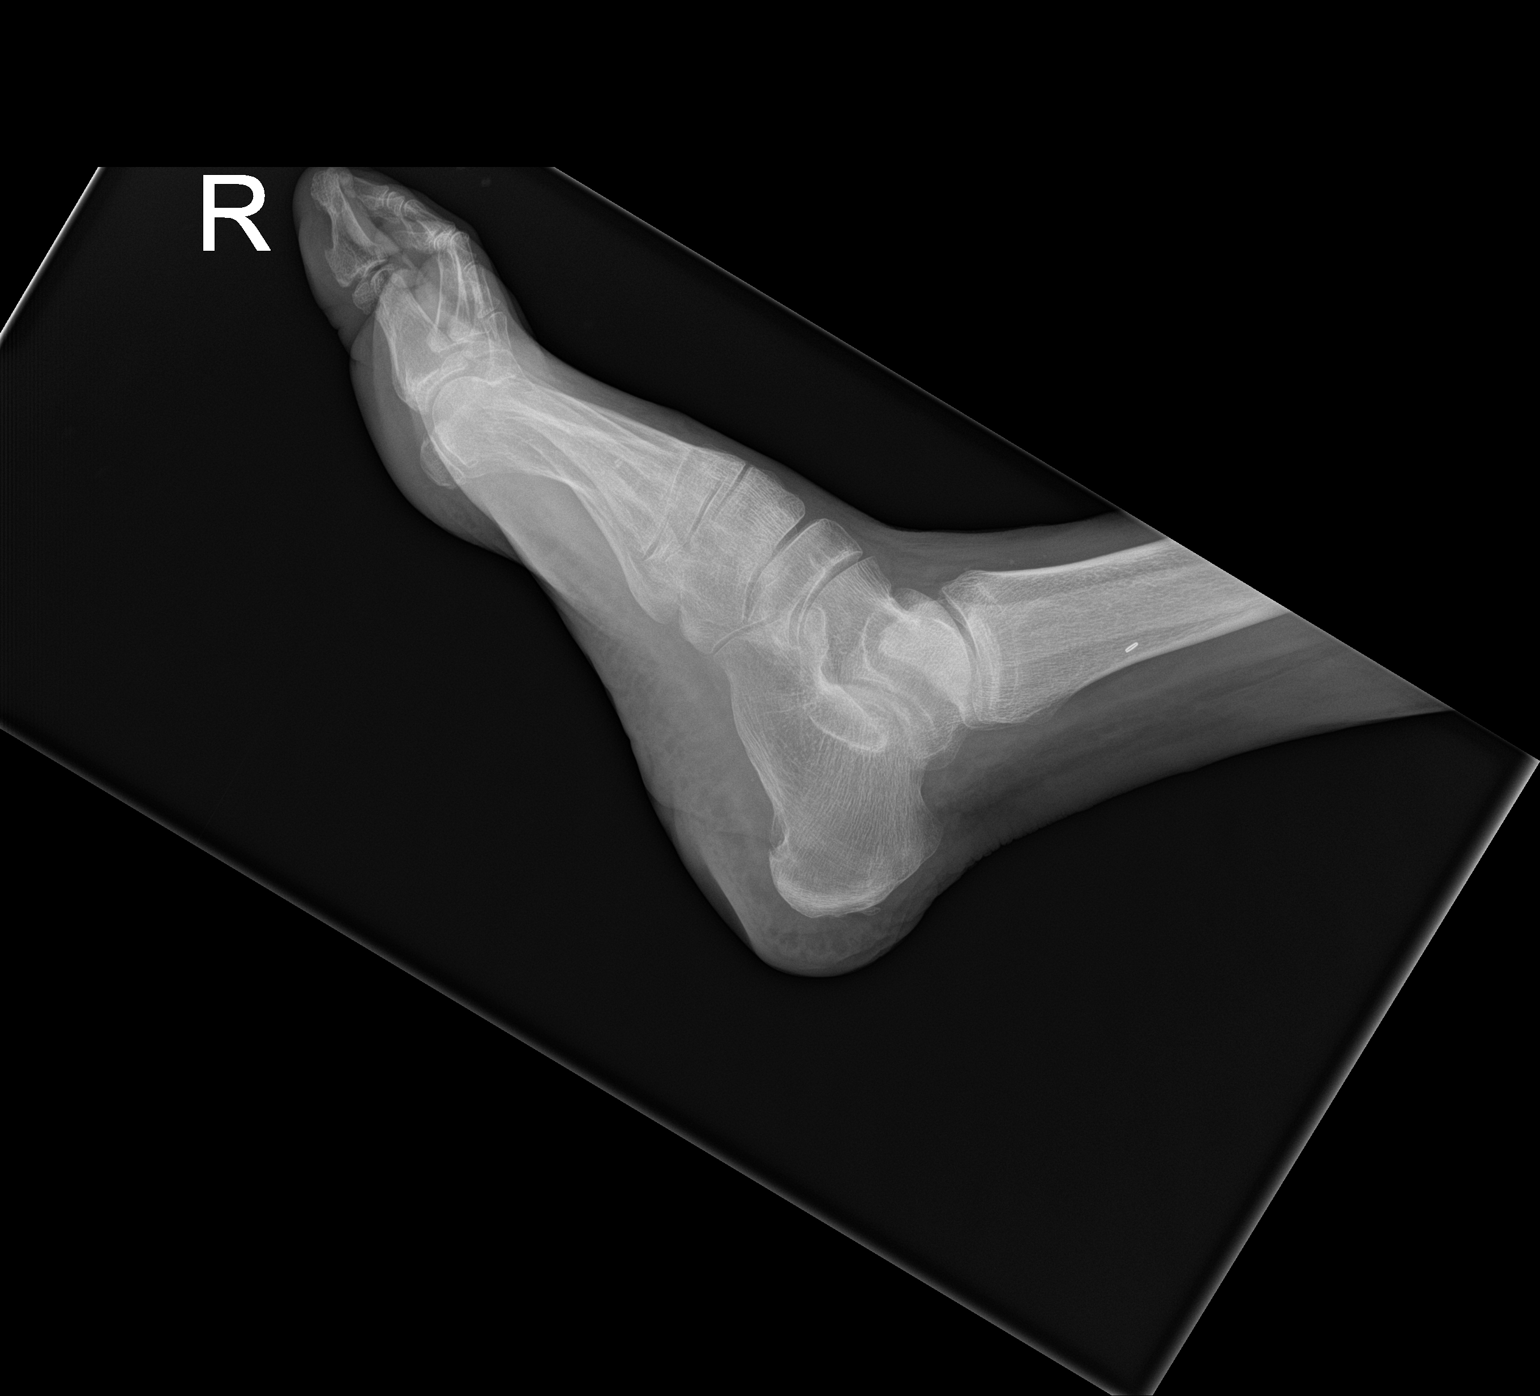

[2 of 2 positions shown; findings below may reference images not displayed]

FINDINGS: No fracture is seen. No bony erosion is noted. Small calcaneal
degenerative spurs are present. There is degenerative change of the
right first MTP joint. No erosion is noted. The bones are slightly
osteopenic some irregularity of the proximal phalanx of the right
fifth toe is noted and clinical correlation is recommended to
exclude acute abnormality.
IMPRESSION: 1. Small calcaneal degenerative spurs.  No acute abnormality.
2. Slight irregularity of the proximal phalanx of the right fifth
toe. Correlate clinically.

## 2019-03-21 ENCOUNTER — Other Ambulatory Visit: Payer: Self-pay | Admitting: *Deleted
